# Patient Record
Sex: Female | Born: 1945 | Race: White | Hispanic: No | Marital: Married | Smoking: Never smoker
Health system: Southern US, Community
[De-identification: ages and names within clinical notes are randomized; demographics above are authoritative.]

## PROBLEM LIST (undated history)

## (undated) DIAGNOSIS — K589 Irritable bowel syndrome without diarrhea: Secondary | ICD-10-CM

## (undated) DIAGNOSIS — R000 Tachycardia, unspecified: Secondary | ICD-10-CM

## (undated) DIAGNOSIS — Z860100 Personal history of colon polyps, unspecified: Secondary | ICD-10-CM

## (undated) DIAGNOSIS — Z9889 Other specified postprocedural states: Secondary | ICD-10-CM

## (undated) DIAGNOSIS — R51 Headache: Secondary | ICD-10-CM

## (undated) DIAGNOSIS — E785 Hyperlipidemia, unspecified: Secondary | ICD-10-CM

## (undated) DIAGNOSIS — M2550 Pain in unspecified joint: Secondary | ICD-10-CM

## (undated) DIAGNOSIS — R05 Cough: Secondary | ICD-10-CM

## (undated) DIAGNOSIS — R0600 Dyspnea, unspecified: Secondary | ICD-10-CM

## (undated) DIAGNOSIS — M869 Osteomyelitis, unspecified: Secondary | ICD-10-CM

## (undated) DIAGNOSIS — K760 Fatty (change of) liver, not elsewhere classified: Secondary | ICD-10-CM

## (undated) DIAGNOSIS — E1140 Type 2 diabetes mellitus with diabetic neuropathy, unspecified: Secondary | ICD-10-CM

## (undated) DIAGNOSIS — Z89429 Acquired absence of other toe(s), unspecified side: Secondary | ICD-10-CM

## (undated) DIAGNOSIS — K219 Gastro-esophageal reflux disease without esophagitis: Secondary | ICD-10-CM

## (undated) DIAGNOSIS — D649 Anemia, unspecified: Secondary | ICD-10-CM

## (undated) DIAGNOSIS — L659 Nonscarring hair loss, unspecified: Secondary | ICD-10-CM

## (undated) DIAGNOSIS — G629 Polyneuropathy, unspecified: Secondary | ICD-10-CM

## (undated) DIAGNOSIS — M1990 Unspecified osteoarthritis, unspecified site: Secondary | ICD-10-CM

## (undated) DIAGNOSIS — J189 Pneumonia, unspecified organism: Secondary | ICD-10-CM

## (undated) DIAGNOSIS — G4700 Insomnia, unspecified: Secondary | ICD-10-CM

## (undated) DIAGNOSIS — G8929 Other chronic pain: Secondary | ICD-10-CM

## (undated) DIAGNOSIS — J302 Other seasonal allergic rhinitis: Secondary | ICD-10-CM

## (undated) DIAGNOSIS — I10 Essential (primary) hypertension: Secondary | ICD-10-CM

## (undated) DIAGNOSIS — R053 Chronic cough: Secondary | ICD-10-CM

## (undated) DIAGNOSIS — K5790 Diverticulosis of intestine, part unspecified, without perforation or abscess without bleeding: Secondary | ICD-10-CM

## (undated) DIAGNOSIS — R112 Nausea with vomiting, unspecified: Secondary | ICD-10-CM

## (undated) HISTORY — DX: Chronic cough: R05.3

## (undated) HISTORY — PX: TUBAL LIGATION: SHX77

## (undated) HISTORY — DX: Hyperlipidemia, unspecified: E78.5

## (undated) HISTORY — DX: Nonscarring hair loss, unspecified: L65.9

## (undated) HISTORY — DX: Acquired absence of other toe(s), unspecified side: Z89.429

## (undated) HISTORY — DX: Type 2 diabetes mellitus with diabetic neuropathy, unspecified: E11.40

## (undated) HISTORY — PX: COLONOSCOPY: SHX174

## (undated) HISTORY — DX: Gastro-esophageal reflux disease without esophagitis: K21.9

## (undated) HISTORY — DX: Cough: R05

## (undated) HISTORY — DX: Irritable bowel syndrome, unspecified: K58.9

## (undated) HISTORY — DX: Essential (primary) hypertension: I10

## (undated) HISTORY — DX: Anemia, unspecified: D64.9

## (undated) HISTORY — PX: ROTATOR CUFF REPAIR: SHX139

## (undated) HISTORY — DX: Other chronic pain: G89.29

---

## 1997-09-19 ENCOUNTER — Ambulatory Visit (HOSPITAL_COMMUNITY): Admission: RE | Admit: 1997-09-19 | Discharge: 1997-09-19 | Payer: Self-pay | Admitting: Obstetrics & Gynecology

## 1997-09-25 ENCOUNTER — Ambulatory Visit (HOSPITAL_COMMUNITY): Admission: RE | Admit: 1997-09-25 | Discharge: 1997-09-25 | Payer: Self-pay | Admitting: Obstetrics & Gynecology

## 1997-10-14 ENCOUNTER — Other Ambulatory Visit: Admission: RE | Admit: 1997-10-14 | Discharge: 1997-10-14 | Payer: Self-pay | Admitting: Obstetrics & Gynecology

## 1998-01-15 ENCOUNTER — Other Ambulatory Visit: Admission: RE | Admit: 1998-01-15 | Discharge: 1998-01-15 | Payer: Self-pay | Admitting: Obstetrics & Gynecology

## 1998-12-15 ENCOUNTER — Other Ambulatory Visit: Admission: RE | Admit: 1998-12-15 | Discharge: 1998-12-15 | Payer: Self-pay | Admitting: Obstetrics & Gynecology

## 1999-01-11 HISTORY — PX: OOPHORECTOMY: SHX86

## 1999-01-11 HISTORY — PX: VAGINAL HYSTERECTOMY: SUR661

## 1999-02-18 ENCOUNTER — Encounter (INDEPENDENT_AMBULATORY_CARE_PROVIDER_SITE_OTHER): Payer: Self-pay

## 1999-02-18 ENCOUNTER — Other Ambulatory Visit: Admission: RE | Admit: 1999-02-18 | Discharge: 1999-02-18 | Payer: Self-pay | Admitting: Gastroenterology

## 1999-07-19 ENCOUNTER — Encounter (INDEPENDENT_AMBULATORY_CARE_PROVIDER_SITE_OTHER): Payer: Self-pay

## 1999-07-19 ENCOUNTER — Ambulatory Visit (HOSPITAL_COMMUNITY): Admission: RE | Admit: 1999-07-19 | Discharge: 1999-07-19 | Payer: Self-pay | Admitting: Obstetrics & Gynecology

## 1999-12-15 ENCOUNTER — Observation Stay (HOSPITAL_COMMUNITY): Admission: RE | Admit: 1999-12-15 | Discharge: 1999-12-16 | Payer: Self-pay | Admitting: Obstetrics & Gynecology

## 1999-12-15 ENCOUNTER — Encounter (INDEPENDENT_AMBULATORY_CARE_PROVIDER_SITE_OTHER): Payer: Self-pay

## 2001-01-22 ENCOUNTER — Other Ambulatory Visit: Admission: RE | Admit: 2001-01-22 | Discharge: 2001-01-22 | Payer: Self-pay | Admitting: Obstetrics & Gynecology

## 2001-01-27 ENCOUNTER — Encounter: Payer: Self-pay | Admitting: Emergency Medicine

## 2001-01-27 ENCOUNTER — Emergency Department (HOSPITAL_COMMUNITY): Admission: EM | Admit: 2001-01-27 | Discharge: 2001-01-27 | Payer: Self-pay | Admitting: Emergency Medicine

## 2001-03-21 ENCOUNTER — Encounter: Payer: Self-pay | Admitting: Orthopaedic Surgery

## 2001-03-21 ENCOUNTER — Encounter: Admission: RE | Admit: 2001-03-21 | Discharge: 2001-03-21 | Payer: Self-pay | Admitting: Orthopaedic Surgery

## 2002-04-02 ENCOUNTER — Other Ambulatory Visit: Admission: RE | Admit: 2002-04-02 | Discharge: 2002-04-02 | Payer: Self-pay | Admitting: Obstetrics & Gynecology

## 2002-11-28 ENCOUNTER — Encounter: Payer: Self-pay | Admitting: Gastroenterology

## 2002-12-03 ENCOUNTER — Encounter: Payer: Self-pay | Admitting: Gastroenterology

## 2003-05-05 ENCOUNTER — Other Ambulatory Visit: Admission: RE | Admit: 2003-05-05 | Discharge: 2003-05-05 | Payer: Self-pay | Admitting: Obstetrics & Gynecology

## 2003-05-07 ENCOUNTER — Encounter: Admission: RE | Admit: 2003-05-07 | Discharge: 2003-05-07 | Payer: Self-pay | Admitting: Obstetrics & Gynecology

## 2003-10-09 ENCOUNTER — Emergency Department (HOSPITAL_COMMUNITY): Admission: EM | Admit: 2003-10-09 | Discharge: 2003-10-09 | Payer: Self-pay | Admitting: Emergency Medicine

## 2003-10-28 ENCOUNTER — Ambulatory Visit: Payer: Self-pay | Admitting: Internal Medicine

## 2003-11-12 ENCOUNTER — Observation Stay (HOSPITAL_COMMUNITY): Admission: AD | Admit: 2003-11-12 | Discharge: 2003-11-14 | Payer: Self-pay | Admitting: Orthopaedic Surgery

## 2004-05-03 ENCOUNTER — Ambulatory Visit: Payer: Self-pay | Admitting: Internal Medicine

## 2004-05-13 ENCOUNTER — Ambulatory Visit: Payer: Self-pay | Admitting: Internal Medicine

## 2004-06-09 ENCOUNTER — Ambulatory Visit (HOSPITAL_COMMUNITY): Admission: RE | Admit: 2004-06-09 | Discharge: 2004-06-09 | Payer: Self-pay | Admitting: Obstetrics & Gynecology

## 2004-06-10 ENCOUNTER — Other Ambulatory Visit: Admission: RE | Admit: 2004-06-10 | Discharge: 2004-06-10 | Payer: Self-pay | Admitting: Obstetrics & Gynecology

## 2004-07-09 ENCOUNTER — Ambulatory Visit: Payer: Self-pay | Admitting: Internal Medicine

## 2004-09-02 ENCOUNTER — Ambulatory Visit: Payer: Self-pay | Admitting: Internal Medicine

## 2004-09-09 ENCOUNTER — Ambulatory Visit: Payer: Self-pay | Admitting: Internal Medicine

## 2005-06-10 ENCOUNTER — Encounter: Admission: RE | Admit: 2005-06-10 | Discharge: 2005-06-10 | Payer: Self-pay | Admitting: Obstetrics & Gynecology

## 2005-08-04 ENCOUNTER — Ambulatory Visit: Payer: Self-pay | Admitting: Gastroenterology

## 2006-03-21 ENCOUNTER — Ambulatory Visit: Payer: Self-pay | Admitting: Internal Medicine

## 2006-03-21 LAB — CONVERTED CEMR LAB
Bilirubin Urine: NEGATIVE
Hemoglobin, Urine: NEGATIVE
Ketones, ur: NEGATIVE mg/dL
Leukocytes, UA: NEGATIVE
Nitrite: NEGATIVE
Specific Gravity, Urine: >=1.03 (ref 1.000–1.03)
Total Protein, Urine: NEGATIVE mg/dL
Urine Glucose: 100 mg/dL — AB
Urobilinogen, UA: 0.2 (ref 0.0–1.0)
pH: 5 (ref 5.0–8.0)

## 2006-03-28 ENCOUNTER — Ambulatory Visit: Payer: Self-pay | Admitting: Gastroenterology

## 2006-08-08 ENCOUNTER — Ambulatory Visit: Payer: Self-pay | Admitting: Internal Medicine

## 2006-10-11 ENCOUNTER — Encounter: Payer: Self-pay | Admitting: Internal Medicine

## 2006-10-11 ENCOUNTER — Ambulatory Visit: Payer: Self-pay | Admitting: Internal Medicine

## 2006-10-11 DIAGNOSIS — E1140 Type 2 diabetes mellitus with diabetic neuropathy, unspecified: Secondary | ICD-10-CM | POA: Insufficient documentation

## 2006-10-11 DIAGNOSIS — L658 Other specified nonscarring hair loss: Secondary | ICD-10-CM | POA: Insufficient documentation

## 2006-10-11 DIAGNOSIS — G629 Polyneuropathy, unspecified: Secondary | ICD-10-CM | POA: Insufficient documentation

## 2006-10-12 DIAGNOSIS — J309 Allergic rhinitis, unspecified: Secondary | ICD-10-CM | POA: Insufficient documentation

## 2006-10-12 DIAGNOSIS — K219 Gastro-esophageal reflux disease without esophagitis: Secondary | ICD-10-CM | POA: Insufficient documentation

## 2006-10-12 DIAGNOSIS — E1169 Type 2 diabetes mellitus with other specified complication: Secondary | ICD-10-CM | POA: Insufficient documentation

## 2006-10-12 DIAGNOSIS — K589 Irritable bowel syndrome without diarrhea: Secondary | ICD-10-CM | POA: Insufficient documentation

## 2006-10-12 DIAGNOSIS — E1151 Type 2 diabetes mellitus with diabetic peripheral angiopathy without gangrene: Secondary | ICD-10-CM | POA: Insufficient documentation

## 2006-10-12 DIAGNOSIS — E785 Hyperlipidemia, unspecified: Secondary | ICD-10-CM

## 2006-10-12 DIAGNOSIS — E119 Type 2 diabetes mellitus without complications: Secondary | ICD-10-CM | POA: Insufficient documentation

## 2006-10-12 DIAGNOSIS — E1165 Type 2 diabetes mellitus with hyperglycemia: Secondary | ICD-10-CM | POA: Insufficient documentation

## 2006-10-27 ENCOUNTER — Ambulatory Visit: Payer: Self-pay | Admitting: Internal Medicine

## 2006-10-27 LAB — CONVERTED CEMR LAB
Cholesterol: 171 mg/dL (ref 0–200)
HDL: 51.7 mg/dL (ref >39.0)
Hgb A1c MFr Bld: 9.9 % — ABNORMAL HIGH (ref 4.6–6.0)
LDL Cholesterol: 98 mg/dL (ref 0–99)
Total CHOL/HDL Ratio: 3.3
Triglycerides: 109 mg/dL (ref 0–149)
VLDL: 22 mg/dL (ref 0–40)

## 2006-11-08 ENCOUNTER — Encounter: Payer: Self-pay | Admitting: Internal Medicine

## 2006-11-08 ENCOUNTER — Ambulatory Visit: Payer: Self-pay | Admitting: Internal Medicine

## 2006-12-05 ENCOUNTER — Ambulatory Visit: Payer: Self-pay | Admitting: Internal Medicine

## 2006-12-05 DIAGNOSIS — I10 Essential (primary) hypertension: Secondary | ICD-10-CM

## 2006-12-05 DIAGNOSIS — I152 Hypertension secondary to endocrine disorders: Secondary | ICD-10-CM | POA: Insufficient documentation

## 2006-12-05 DIAGNOSIS — E1159 Type 2 diabetes mellitus with other circulatory complications: Secondary | ICD-10-CM | POA: Insufficient documentation

## 2006-12-21 ENCOUNTER — Encounter: Payer: Self-pay | Admitting: Internal Medicine

## 2006-12-26 ENCOUNTER — Encounter: Payer: Self-pay | Admitting: Internal Medicine

## 2006-12-28 ENCOUNTER — Telehealth: Payer: Self-pay | Admitting: Internal Medicine

## 2007-01-02 ENCOUNTER — Encounter: Admission: RE | Admit: 2007-01-02 | Discharge: 2007-01-02 | Payer: Self-pay | Admitting: Obstetrics & Gynecology

## 2007-01-02 ENCOUNTER — Encounter: Payer: Self-pay | Admitting: Internal Medicine

## 2007-01-15 ENCOUNTER — Encounter: Payer: Self-pay | Admitting: Internal Medicine

## 2007-01-15 ENCOUNTER — Encounter: Admission: RE | Admit: 2007-01-15 | Discharge: 2007-04-15 | Payer: Self-pay | Admitting: Internal Medicine

## 2007-01-26 ENCOUNTER — Ambulatory Visit: Payer: Self-pay | Admitting: Internal Medicine

## 2007-01-26 ENCOUNTER — Telehealth: Payer: Self-pay | Admitting: Internal Medicine

## 2007-01-31 ENCOUNTER — Ambulatory Visit: Payer: Self-pay | Admitting: Internal Medicine

## 2007-01-31 LAB — CONVERTED CEMR LAB
Cholesterol: 170 mg/dL (ref 0–200)
Free T4: 0.9 ng/dL (ref 0.6–1.6)
HDL: 54.8 mg/dL (ref >39.0)
Hgb A1c MFr Bld: 9 % — ABNORMAL HIGH (ref 4.6–6.0)
LDL Cholesterol: 100 mg/dL — ABNORMAL HIGH (ref 0–99)
TSH: 1.63 microintl units/mL (ref 0.35–5.50)
Total CHOL/HDL Ratio: 3.1
Triglycerides: 75 mg/dL (ref 0–149)
VLDL: 15 mg/dL (ref 0–40)

## 2007-02-01 ENCOUNTER — Encounter: Payer: Self-pay | Admitting: Internal Medicine

## 2007-02-02 ENCOUNTER — Encounter: Payer: Self-pay | Admitting: Internal Medicine

## 2007-02-02 ENCOUNTER — Ambulatory Visit: Payer: Self-pay

## 2007-02-05 ENCOUNTER — Ambulatory Visit: Payer: Self-pay | Admitting: Internal Medicine

## 2007-02-28 ENCOUNTER — Telehealth: Payer: Self-pay | Admitting: Internal Medicine

## 2007-04-18 ENCOUNTER — Telehealth: Payer: Self-pay | Admitting: Internal Medicine

## 2007-04-25 ENCOUNTER — Ambulatory Visit: Payer: Self-pay | Admitting: Internal Medicine

## 2007-06-01 ENCOUNTER — Telehealth: Payer: Self-pay | Admitting: Internal Medicine

## 2007-08-22 ENCOUNTER — Encounter: Payer: Self-pay | Admitting: Gastroenterology

## 2007-09-25 ENCOUNTER — Encounter: Payer: Self-pay | Admitting: Internal Medicine

## 2007-10-26 ENCOUNTER — Ambulatory Visit: Payer: Self-pay | Admitting: Internal Medicine

## 2007-10-26 LAB — CONVERTED CEMR LAB
ALT: 11 units/L (ref 0–35)
AST: 15 units/L (ref 0–37)
Albumin: 4.1 g/dL (ref 3.5–5.2)
Alkaline Phosphatase: 52 units/L (ref 39–117)
BUN: 16 mg/dL (ref 6–23)
Bilirubin, Direct: 0.1 mg/dL (ref 0.0–0.3)
CO2: 32 meq/L (ref 19–32)
Calcium: 9.7 mg/dL (ref 8.4–10.5)
Chloride: 100 meq/L (ref 96–112)
Cholesterol: 155 mg/dL (ref 0–200)
Creatinine, Ser: 0.6 mg/dL (ref 0.4–1.2)
GFR calc Af Amer: 131 mL/min
GFR calc non Af Amer: 108 mL/min
Glucose, Bld: 127 mg/dL — ABNORMAL HIGH (ref 70–99)
HDL: 55.9 mg/dL (ref >39.0)
LDL Cholesterol: 86 mg/dL (ref 0–99)
Potassium: 4.2 meq/L (ref 3.5–5.1)
Sodium: 139 meq/L (ref 135–145)
TSH: 1.81 microintl units/mL (ref 0.35–5.50)
Total Bilirubin: 0.5 mg/dL (ref 0.3–1.2)
Total CHOL/HDL Ratio: 2.8
Total Protein: 7.4 g/dL (ref 6.0–8.3)
Triglycerides: 68 mg/dL (ref 0–149)
VLDL: 14 mg/dL (ref 0–40)

## 2007-10-28 ENCOUNTER — Encounter: Payer: Self-pay | Admitting: Internal Medicine

## 2007-10-28 ENCOUNTER — Telehealth: Payer: Self-pay | Admitting: Internal Medicine

## 2007-10-31 ENCOUNTER — Ambulatory Visit: Payer: Self-pay | Admitting: Gastroenterology

## 2007-11-20 ENCOUNTER — Ambulatory Visit: Payer: Self-pay | Admitting: Gastroenterology

## 2007-12-04 ENCOUNTER — Ambulatory Visit: Payer: Self-pay | Admitting: Gastroenterology

## 2007-12-04 LAB — HM COLONOSCOPY

## 2008-05-20 ENCOUNTER — Telehealth (INDEPENDENT_AMBULATORY_CARE_PROVIDER_SITE_OTHER): Payer: Self-pay | Admitting: *Deleted

## 2008-05-21 ENCOUNTER — Ambulatory Visit: Payer: Self-pay | Admitting: Internal Medicine

## 2008-06-05 ENCOUNTER — Ambulatory Visit: Payer: Self-pay | Admitting: Internal Medicine

## 2008-06-05 ENCOUNTER — Telehealth (INDEPENDENT_AMBULATORY_CARE_PROVIDER_SITE_OTHER): Payer: Self-pay | Admitting: *Deleted

## 2008-06-05 DIAGNOSIS — J039 Acute tonsillitis, unspecified: Secondary | ICD-10-CM | POA: Insufficient documentation

## 2008-06-05 LAB — CONVERTED CEMR LAB: Rapid Strep: NEGATIVE

## 2008-06-10 ENCOUNTER — Encounter: Payer: Self-pay | Admitting: Internal Medicine

## 2008-08-19 ENCOUNTER — Telehealth: Payer: Self-pay | Admitting: Internal Medicine

## 2008-10-14 ENCOUNTER — Ambulatory Visit: Payer: Self-pay | Admitting: Internal Medicine

## 2008-10-14 LAB — CONVERTED CEMR LAB
ALT: 14 units/L (ref 0–35)
AST: 13 units/L (ref 0–37)
Albumin: 4 g/dL (ref 3.5–5.2)
Alkaline Phosphatase: 52 units/L (ref 39–117)
BUN: 16 mg/dL (ref 6–23)
Basophils Absolute: 0 10*3/uL (ref 0.0–0.1)
Basophils Relative: 0.5 % (ref 0.0–3.0)
Bilirubin Urine: NEGATIVE
Bilirubin, Direct: 0.1 mg/dL (ref 0.0–0.3)
Blood Glucose, Fingerstick: 196
CO2: 30 meq/L (ref 19–32)
Calcium: 9.6 mg/dL (ref 8.4–10.5)
Chloride: 105 meq/L (ref 96–112)
Cholesterol: 159 mg/dL (ref 0–200)
Creatinine, Ser: 0.6 mg/dL (ref 0.4–1.2)
Eosinophils Absolute: 0.2 10*3/uL (ref 0.0–0.7)
Eosinophils Relative: 1.7 % (ref 0.0–5.0)
GFR calc non Af Amer: 108.01 mL/min (ref >60)
Glucose, Bld: 146 mg/dL — ABNORMAL HIGH (ref 70–99)
Glucose, Urine, Semiquant: NEGATIVE
HCT: 38 % (ref 36.0–46.0)
HDL: 51.7 mg/dL (ref >39.00)
Hemoglobin: 13 g/dL (ref 12.0–15.0)
Hgb A1c MFr Bld: 8.3 % — ABNORMAL HIGH (ref 4.6–6.5)
Ketones, urine, test strip: NEGATIVE
LDL Cholesterol: 87 mg/dL (ref 0–99)
Lymphocytes Relative: 30 % (ref 12.0–46.0)
Lymphs Abs: 2.7 10*3/uL (ref 0.7–4.0)
MCHC: 34.1 g/dL (ref 30.0–36.0)
MCV: 85.8 fL (ref 78.0–100.0)
Monocytes Absolute: 0.4 10*3/uL (ref 0.1–1.0)
Monocytes Relative: 4.8 % (ref 3.0–12.0)
Neutro Abs: 5.7 10*3/uL (ref 1.4–7.7)
Neutrophils Relative %: 63 % (ref 43.0–77.0)
Nitrite: NEGATIVE
Platelets: 412 10*3/uL — ABNORMAL HIGH (ref 150.0–400.0)
Potassium: 4 meq/L (ref 3.5–5.1)
Protein, U semiquant: NEGATIVE
RBC: 4.42 M/uL (ref 3.87–5.11)
RDW: 11.7 % (ref 11.5–14.6)
Sodium: 140 meq/L (ref 135–145)
Specific Gravity, Urine: 1.02
Total Bilirubin: 0.6 mg/dL (ref 0.3–1.2)
Total CHOL/HDL Ratio: 3
Total Protein: 7.3 g/dL (ref 6.0–8.3)
Triglycerides: 103 mg/dL (ref 0.0–149.0)
Urobilinogen, UA: 0.2
VLDL: 20.6 mg/dL (ref 0.0–40.0)
WBC Urine, dipstick: NEGATIVE
WBC: 9 10*3/uL (ref 4.5–10.5)
pH: 5

## 2008-10-22 ENCOUNTER — Ambulatory Visit: Payer: Self-pay | Admitting: Internal Medicine

## 2008-10-22 DIAGNOSIS — H60399 Other infective otitis externa, unspecified ear: Secondary | ICD-10-CM | POA: Insufficient documentation

## 2008-10-22 DIAGNOSIS — B373 Candidiasis of vulva and vagina: Secondary | ICD-10-CM | POA: Insufficient documentation

## 2008-10-24 ENCOUNTER — Telehealth: Payer: Self-pay | Admitting: Internal Medicine

## 2008-10-24 ENCOUNTER — Ambulatory Visit: Payer: Self-pay | Admitting: Internal Medicine

## 2008-10-24 DIAGNOSIS — L02818 Cutaneous abscess of other sites: Secondary | ICD-10-CM | POA: Insufficient documentation

## 2008-10-24 DIAGNOSIS — L03818 Cellulitis of other sites: Secondary | ICD-10-CM

## 2008-11-19 ENCOUNTER — Telehealth: Payer: Self-pay | Admitting: Internal Medicine

## 2008-11-27 ENCOUNTER — Encounter: Admission: RE | Admit: 2008-11-27 | Discharge: 2008-11-27 | Payer: Self-pay | Admitting: Obstetrics & Gynecology

## 2009-03-03 ENCOUNTER — Telehealth (INDEPENDENT_AMBULATORY_CARE_PROVIDER_SITE_OTHER): Payer: Self-pay | Admitting: *Deleted

## 2009-03-09 ENCOUNTER — Telehealth: Payer: Self-pay | Admitting: Internal Medicine

## 2009-04-08 ENCOUNTER — Telehealth: Payer: Self-pay | Admitting: Internal Medicine

## 2009-07-31 ENCOUNTER — Encounter: Payer: Self-pay | Admitting: Internal Medicine

## 2009-07-31 ENCOUNTER — Telehealth (INDEPENDENT_AMBULATORY_CARE_PROVIDER_SITE_OTHER): Payer: Self-pay | Admitting: *Deleted

## 2009-07-31 LAB — HM DIABETES EYE EXAM

## 2009-09-29 ENCOUNTER — Ambulatory Visit: Payer: Self-pay | Admitting: Internal Medicine

## 2009-10-30 ENCOUNTER — Telehealth: Payer: Self-pay | Admitting: Internal Medicine

## 2009-10-31 ENCOUNTER — Ambulatory Visit: Payer: Self-pay | Admitting: Family Medicine

## 2009-10-31 DIAGNOSIS — J019 Acute sinusitis, unspecified: Secondary | ICD-10-CM | POA: Insufficient documentation

## 2009-12-16 ENCOUNTER — Encounter
Admission: RE | Admit: 2009-12-16 | Discharge: 2009-12-16 | Payer: Self-pay | Source: Home / Self Care | Admitting: Internal Medicine

## 2009-12-16 LAB — HM MAMMOGRAPHY: HM Mammogram: NEGATIVE

## 2010-02-04 ENCOUNTER — Ambulatory Visit
Admission: RE | Admit: 2010-02-04 | Discharge: 2010-02-04 | Payer: Self-pay | Source: Home / Self Care | Attending: Internal Medicine | Admitting: Internal Medicine

## 2010-02-04 ENCOUNTER — Other Ambulatory Visit: Payer: Self-pay | Admitting: Internal Medicine

## 2010-02-04 LAB — CBC WITH DIFFERENTIAL/PLATELET
Basophils Absolute: 0.1 10*3/uL (ref 0.0–0.1)
Basophils Relative: 0.8 % (ref 0.0–3.0)
Eosinophils Absolute: 0.2 10*3/uL (ref 0.0–0.7)
Eosinophils Relative: 1.8 % (ref 0.0–5.0)
HCT: 38.2 % (ref 36.0–46.0)
Hemoglobin: 13 g/dL (ref 12.0–15.0)
Lymphocytes Relative: 27.8 % (ref 12.0–46.0)
Lymphs Abs: 2.8 10*3/uL (ref 0.7–4.0)
MCHC: 34 g/dL (ref 30.0–36.0)
MCV: 84.6 fl (ref 78.0–100.0)
Monocytes Absolute: 0.5 10*3/uL (ref 0.1–1.0)
Monocytes Relative: 4.8 % (ref 3.0–12.0)
Neutro Abs: 6.5 10*3/uL (ref 1.4–7.7)
Neutrophils Relative %: 64.8 % (ref 43.0–77.0)
Platelets: 419 10*3/uL — ABNORMAL HIGH (ref 150.0–400.0)
RBC: 4.52 Mil/uL (ref 3.87–5.11)
RDW: 13.2 % (ref 11.5–14.6)
WBC: 10.1 10*3/uL (ref 4.5–10.5)

## 2010-02-04 LAB — BASIC METABOLIC PANEL
BUN: 12 mg/dL (ref 6–23)
CO2: 28 mEq/L (ref 19–32)
Calcium: 9.7 mg/dL (ref 8.4–10.5)
Chloride: 98 mEq/L (ref 96–112)
Creatinine, Ser: 0.6 mg/dL (ref 0.4–1.2)
GFR: 107.54 mL/min (ref >60.00)
Glucose, Bld: 167 mg/dL — ABNORMAL HIGH (ref 70–99)
Potassium: 4.2 mEq/L (ref 3.5–5.1)
Sodium: 136 mEq/L (ref 135–145)

## 2010-02-04 LAB — LIPID PANEL
Cholesterol: 146 mg/dL (ref 0–200)
HDL: 44.9 mg/dL (ref >39.00)
LDL Cholesterol: 84 mg/dL (ref 0–99)
Total CHOL/HDL Ratio: 3
Triglycerides: 85 mg/dL (ref 0.0–149.0)
VLDL: 17 mg/dL (ref 0.0–40.0)

## 2010-02-04 LAB — HEPATIC FUNCTION PANEL
ALT: 22 U/L (ref 0–35)
AST: 27 U/L (ref 0–37)
Albumin: 4.1 g/dL (ref 3.5–5.2)
Alkaline Phosphatase: 53 U/L (ref 39–117)
Bilirubin, Direct: 0.1 mg/dL (ref 0.0–0.3)
Total Bilirubin: 0.6 mg/dL (ref 0.3–1.2)
Total Protein: 7.1 g/dL (ref 6.0–8.3)

## 2010-02-04 LAB — B12 AND FOLATE PANEL
Folate: 22.4 ng/mL (ref >5.9)
Vitamin B-12: 212 pg/mL (ref 211–911)

## 2010-02-04 LAB — IBC PANEL
Iron: 67 ug/dL (ref 42–145)
Saturation Ratios: 18.5 % — ABNORMAL LOW (ref 20.0–50.0)
Transferrin: 258.9 mg/dL (ref 212.0–360.0)

## 2010-02-04 LAB — CONVERTED CEMR LAB: Blood Glucose, AC Bkfst: 204 mg/dL

## 2010-02-04 LAB — HEMOGLOBIN A1C: Hgb A1c MFr Bld: 10.4 % — ABNORMAL HIGH (ref 4.6–6.5)

## 2010-02-05 ENCOUNTER — Telehealth (INDEPENDENT_AMBULATORY_CARE_PROVIDER_SITE_OTHER): Payer: Self-pay | Admitting: *Deleted

## 2010-02-09 NOTE — Progress Notes (Signed)
  Phone Note Refill Request   Refills Requested: Medication #1:  AMITRIPTYLINE HCL 25 MG  TABS 1 at bedtime for neuropathy Initial call taken by: Lamar Sprinkles, CMA,  March 09, 2009 5:42 PM    Prescriptions: AMITRIPTYLINE HCL 25 MG  TABS (AMITRIPTYLINE HCL) 1 at bedtime for neuropathy  #14 x 1   Entered by:   Lamar Sprinkles, CMA   Authorized by:   Jacques Navy MD   Signed by:   Lamar Sprinkles, CMA on 03/09/2009   Method used:   [facility]        [facility] [address] [phone]* (retail)       [address]       [place], [place]  [address]       Ph: [phone] or [phone]       Fax: [phone]   RxID:   [id number] AMITRIPTYLINE HCL 25 MG  TABS (AMITRIPTYLINE HCL) 1 at bedtime for neuropathy  #90 x 3   Entered by:   Lamar Sprinkles, CMA   Authorized by:   Jacques Navy MD   Signed by:   Lamar Sprinkles, CMA on 03/09/2009   Method used:   Electronically to        [facility]* (mail-order)             ,          Ph: [phone]       Fax: [phone]   RxID:   [id number]

## 2010-02-09 NOTE — Progress Notes (Signed)
Summary: refill  Phone Note Call from Patient   Summary of Call: Pt called stating that her glands are swollen in her throat again. Wants to know if she can get another prescription for [facility]cillin [facility] [address] Please advise. Initial call taken by: Alysia Penna,  October 30, 2009 1:15 PM  Follow-up for Phone Call        LEft vm to call office back. Pt needs office visit for eval.  Follow-up by: Lamar Sprinkles, CMA,  October 30, 2009 1:33 PM  Additional Follow-up for Phone Call Additional follow up Details #1::        left vm for pt to contact office.  Additional Follow-up by: Lamar Sprinkles, CMA,  October 30, 2009 5:26 PM

## 2010-02-09 NOTE — Progress Notes (Signed)
     Allergies: 1)  ! Codeine 2)  ! Darvon   Past History:  Care Management: Ophthalmology:Pt's last eye exam was 07/31/2009 at [facility]. Normal exam  no retinopathy. Advised follow up to recheck glaucome in 3 months,

## 2010-02-09 NOTE — Letter (Signed)
   [facility] [address] [place], [place]  [address] Phone: [phone]      October 28, 2007   Beth Holden [address] [place], [place] [address]  RE:  LAB RESULTS  Dear  [name],  The following is an interpretation of your most recent lab tests.  Please take note of any instructions provided or changes to medications that have resulted from your lab work.  ELECTROLYTES:  Good - no changes needed  KIDNEY FUNCTION TESTS:  Good - no changes needed  LIVER FUNCTION TESTS:  Good - no changes needed  LIPID PANEL:  Good - no changes needed Triglyceride: 68   Cholesterol: 155   LDL: 86   HDL: 55.9   Chol/HDL%:  2.8 CALC  THYROID STUDIES:  Thyroid studies normal TSH: 1.81     DIABETIC STUDIES:  Good - no changes needed Blood Glucose: 127   HgbA1C: 9.0        Blood sugar is poorly controlled with A1C of 9.0% with a goal of 7% or less. Please schedule a follow-up appointment so that we can discuss treatment options that will bring your diabetes under better control.   Sincerely Yours,    Jacques Navy MD

## 2010-02-09 NOTE — Assessment & Plan Note (Signed)
Summary: SORE THROAT---STC   Vital Signs:  Patient profile:   65 year old female Height:      67 inches Weight:      184 pounds BMI:     28.92 O2 Sat:      99 % on Room air Temp:     98.0 degrees F oral Pulse rate:   83 / minute BP sitting:   122 / 70  (left arm) Cuff size:   regular  Vitals Entered By: Alysia Penna (September 29, 2009 2:32 PM)  O2 Flow:  Room air CC: pt c/o sore throat beginning 09/28/2009. Pt has been gargling with warm salt water every 30 mins./ cp sma   Primary Care Provider:  Norins  CC:  pt c/o sore throat beginning 09/28/2009. Pt has been gargling with warm salt water every 30 mins./ cp sma.  History of Present Illness: Patient presents with the on-set of a sore throat. No fever, chills, cough, SOB. No Nausea or vomiting. She does feel sinus congestion and pressure in her ears. It does hurt to swallow.   Current Medications (verified): 1)  Enalapril Maleate 20 Mg  Tabs (Enalapril Maleate) .... Once Daily 2)  Nexium 40 Mg  Cpdr (Esomeprazole Magnesium) .... One Tablet By Mouth Two Times A Day 3)  Metformin Hcl 1000 Mg  Tabs (Metformin Hcl) .... Two Times A Day 4)  Crestor 5 Mg  Tabs (Rosuvastatin Calcium) .Marland Kitchen.. 1 By Mouth At Bedtime 5)  Cetirizine Hcl 10 Mg  Tabs (Cetirizine Hcl) .Marland Kitchen.. 1 By Mouth Every Morning 6)  Tylenol Extra Strength 500 Mg  Tabs (Acetaminophen) .... As Needed 7)  Gabapentin 300 Mg  Caps (Gabapentin) .Marland Kitchen.. 1 At Bedtime 8)  Amitriptyline Hcl 25 Mg  Tabs (Amitriptyline Hcl) .Marland Kitchen.. 1 At Bedtime For Neuropathy 9)  Bd Ultra-Fine Pen Needles 29g X 12.71mm  Misc (Insulin Pen Needle) .... Use As Directed 10)  Onetouch Ultra Test   Strp (Glucose Blood) .... Two Times A Day Dx: 250.01 11)  Byetta 10 Mcg Pen 10 Mcg/0.57ml  Soln (Exenatide) .... Inject 10 Mcg  Two Times A Day  Allergies (verified): 1)  ! Codeine 2)  ! Darvon  Past History:  Past Medical History: Last updated: 10/31/2007 Allergic rhinitis Colonic polyps, hx of - adenoma  02/1999 Diabetes mellitus, type II GERD Hyperlipidemia IBS Hypertension Peripheral neuropathy Erosive esophagitis  Past Surgical History: Last updated: 01/26/2007 Hysterectomy-vaginal  '01 Oophorectomy-'01 Tubal ligation Rotator cuff repair - left PSH reviewed for relevance, FH reviewed for relevance  Review of Systems  The patient denies anorexia, fever, weight loss, chest pain, syncope, dyspnea on exertion, abdominal pain, muscle weakness, abnormal bleeding, and enlarged lymph nodes.    Physical Exam  General:  WNWD white female Head:  no tenderness to percussion over the frontal or maxillary sinus Eyes:  C&S clear Ears:  EACs clear, TM's normal Mouth:  posterior pharyns with erythema without exudate Lungs:  normal respiratory effort, normal breath sounds, no crackles, and no wheezes.   Heart:  normal rate and regular rhythm.   Abdomen:  soft and normal bowel sounds.   Msk:  normal ROM.   Pulses:  2+ radial Neurologic:  alert & oriented X3 and gait normal.   Skin:  turgor normal and color normal.   Cervical Nodes:  no anterior cervical adenopathy and no posterior cervical adenopathy.   Psych:  Oriented X3, normally interactive, and good eye contact.     Impression & Recommendations:  Problem # 1:  PHARYNGITIS, ACUTE (ICD-462) Acute pharyngitis  Plan - amoxicillin 875 mg two times a day x 7 days           supportive care.  Her updated medication list for this problem includes:    Tylenol Extra Strength 500 Mg Tabs (Acetaminophen) .Marland Kitchen... As needed    Amoxicillin 875 Mg Tabs (Amoxicillin) .Marland Kitchen... 1 by mouth two times a day x 7 for pharyngitis  Complete Medication List: 1)  Enalapril Maleate 20 Mg Tabs (Enalapril maleate) .... Once daily 2)  Nexium 40 Mg Cpdr (Esomeprazole magnesium) .... One tablet by mouth two times a day 3)  Metformin Hcl 1000 Mg Tabs (Metformin hcl) .... Two times a day 4)  Crestor 5 Mg Tabs (Rosuvastatin calcium) .Marland Kitchen.. 1 by mouth at bedtime 5)   Cetirizine Hcl 10 Mg Tabs (Cetirizine hcl) .Marland Kitchen.. 1 by mouth every morning 6)  Tylenol Extra Strength 500 Mg Tabs (Acetaminophen) .... As needed 7)  Gabapentin 300 Mg Caps (Gabapentin) .Marland Kitchen.. 1 at bedtime 8)  Amitriptyline Hcl 25 Mg Tabs (Amitriptyline hcl) .Marland Kitchen.. 1 at bedtime for neuropathy 9)  Bd Ultra-fine Pen Needles 29g X 12.38mm Misc (Insulin pen needle) .... Use as directed 10)  Onetouch Ultra Test Strp (Glucose blood) .... Two times a day dx: 250.01 11)  Byetta 10 Mcg Pen 10 Mcg/0.47ml Soln (Exenatide) .... Inject 10 mcg  two times a day 12)  Amoxicillin 875 Mg Tabs (Amoxicillin) .Marland Kitchen.. 1 by mouth two times a day x 7 for pharyngitis  Patient Instructions: 1)  pharyngitis - probably bacterial. Take Amoxicillin two times a day x 7 days; gargle of choice; Vit C 1000-1500 mg daily; hydrate; tylenol 500-1000mg  three times a day.  Prescriptions: AMOXICILLIN 875 MG TABS (AMOXICILLIN) 1 by mouth two times a day x 7 for pharyngitis  #14 x 0   Entered and Authorized by:   Jacques Navy MD   Signed by:   Jacques Navy MD on 09/29/2009   Method used:   [facility]        [facility] [address] [phone]* (retail)       [address]       [place], [place]  [address]       Ph: [phone] or [phone]       Fax: [phone]   RxID:   [id number]

## 2010-02-09 NOTE — Miscellaneous (Signed)
Summary: Orders Update   Clinical Lists Changes  Orders: Added new Referral order of Endocrinology Referral (Endocrine) - Signed 

## 2010-02-09 NOTE — Progress Notes (Signed)
  Phone Note Refill Request Message from:  Fax from Pharmacy on March 03, 2009 12:24 PM  Refills Requested: Medication #1:  ENALAPRIL MALEATE 20 MG  TABS once daily Initial call taken by: Ami Bullins CMA,  March 03, 2009 12:24 PM    Prescriptions: ENALAPRIL MALEATE 20 MG  TABS (ENALAPRIL MALEATE) once daily  #90 x 3   Entered by:   Ami Bullins CMA   Authorized by:   Jacques Navy MD   Signed by:   Bill Salinas CMA on 03/03/2009   Method used:   Faxed to ...       [facility]* (mail-order)             ,          Ph: [phone]       Fax: [phone]   RxID:   [id number]

## 2010-02-09 NOTE — Assessment & Plan Note (Signed)
Summary: ear pain SD   Vital Signs:  Patient profile:   65 year old female Height:      67 inches Weight:      185 pounds BMI:     29.08 O2 Sat:      97 % on Room air Temp:     98.6 degrees F oral Pulse rate:   83 / minute BP sitting:   120 / 72  (left arm) Cuff size:   regular  Vitals Entered By: Bill Salinas CMA (October 24, 2008 3:49 PM)  O2 Flow:  Room air CC: Pt was here earlier in the week and had a place on her Right ear lanced, she states the pain and swelling in that ear has returned/ ab   Primary Care Provider:  Norins  CC:  Pt was here earlier in the week and had a place on her Right ear lanced and she states the pain and swelling in that ear has returned/ ab.  History of Present Illness: here with above, wanted a re-check of the right tragus area, as she states it may be increasing in swelling and discomfort, despite good compliance with oral meds (2 antibx - see EMR) and recent I&D. denies fever, headache, chills, ST , cough or other symptoms today.  Pt denies CP, sob, doe, wheezing, orthopnea, pnd, worsening LE edema, palps, dizziness or syncope .  Pt denies polydipsia, polyuria, or low sugar symptoms such as shakiness improved with eating.  Overall good compliance with meds, trying to follow low chol, DM diet, wt stable, little excercise however   Problems Prior to Update: 1)  Candidiasis, Vaginal  (ICD-112.1) 2)  Unspecified Infective Otitis Externa  (ICD-380.10) 3)  Tonsillitis  (ICD-463) 4)  Constipation  (ICD-564.00) 5)  Unspecified Disease of Hair and Hair Follicles  (ICD-704.9) 6)  Chest Pain  (ICD-786.50) 7)  Unspecified Essential Hypertension  (ICD-401.9) 8)  Alopecia Nec  (ICD-704.09) 9)  Diabetic Peripheral Neuropathy  (ICD-250.60) 10)  Irritable Bowel Syndrome  (ICD-564.1) 11)  Hyperlipidemia  (ICD-272.4) 12)  Gerd  (ICD-530.81) 13)  Diabetes Mellitus, Type II  (ICD-250.00) 14)  Colonic Polyps, Hx of  (ICD-V12.72) 15)  Allergic Rhinitis  (ICD-477.9)   Medications Prior to Update: 1)  Enalapril Maleate 20 Mg  Tabs (Enalapril Maleate) .... Once Daily 2)  Nexium 40 Mg  Cpdr (Esomeprazole Magnesium) .... One Tablet By Mouth Two Times A Day 3)  Metformin Hcl 1000 Mg  Tabs (Metformin Hcl) .... Two Times A Day 4)  Crestor 5 Mg  Tabs (Rosuvastatin Calcium) .Marland Kitchen.. 1 By Mouth At Bedtime 5)  Cetirizine Hcl 10 Mg  Tabs (Cetirizine Hcl) .Marland Kitchen.. 1 By Mouth Every Morning 6)  Tylenol Extra Strength 500 Mg  Tabs (Acetaminophen) .... As Needed 7)  Gabapentin 300 Mg  Caps (Gabapentin) .Marland Kitchen.. 1 At Bedtime 8)  Amitriptyline Hcl 25 Mg  Tabs (Amitriptyline Hcl) .Marland Kitchen.. 1 At Bedtime For Neuropathy 9)  Bd Ultra-Fine Pen Needles 29g X 12.33mm  Misc (Insulin Pen Needle) .... Use As Directed 10)  Onetouch Ultra Test   Strp (Glucose Blood) .... Two Times A Day Dx: 250.01 11)  Byetta 10 Mcg Pen 10 Mcg/0.68ml  Soln (Exenatide) .... Inject 10 Mcg  Two Times A Day 12)  Doxycycline Hyclate 100 Mg Caps (Doxycycline Hyclate) .... Take 1 Two Times A Day 13)  Mupirocin 2 % Oint (Mupirocin) .... Apply Two Times A Day 14)  Amoxicillin-Pot Clavulanate 875-125 Mg Tabs (Amoxicillin-Pot Clavulanate) .Marland Kitchen.. 1 By Mouth Two Times A  Day 15)  Fluconazole 100 Mg Tabs (Fluconazole) .Marland Kitchen.. 1 By Mouth Every Other Day Until Gone  Current Medications (verified): 1)  Enalapril Maleate 20 Mg  Tabs (Enalapril Maleate) .... Once Daily 2)  Nexium 40 Mg  Cpdr (Esomeprazole Magnesium) .... One Tablet By Mouth Two Times A Day 3)  Metformin Hcl 1000 Mg  Tabs (Metformin Hcl) .... Two Times A Day 4)  Crestor 5 Mg  Tabs (Rosuvastatin Calcium) .Marland Kitchen.. 1 By Mouth At Bedtime 5)  Cetirizine Hcl 10 Mg  Tabs (Cetirizine Hcl) .Marland Kitchen.. 1 By Mouth Every Morning 6)  Tylenol Extra Strength 500 Mg  Tabs (Acetaminophen) .... As Needed 7)  Gabapentin 300 Mg  Caps (Gabapentin) .Marland Kitchen.. 1 At Bedtime 8)  Amitriptyline Hcl 25 Mg  Tabs (Amitriptyline Hcl) .Marland Kitchen.. 1 At Bedtime For Neuropathy 9)  Bd Ultra-Fine Pen Needles 29g X 12.52mm  Misc (Insulin  Pen Needle) .... Use As Directed 10)  Onetouch Ultra Test   Strp (Glucose Blood) .... Two Times A Day Dx: 250.01 11)  Byetta 10 Mcg Pen 10 Mcg/0.88ml  Soln (Exenatide) .... Inject 10 Mcg  Two Times A Day 12)  Doxycycline Hyclate 100 Mg Caps (Doxycycline Hyclate) .... Take 1 Two Times A Day 13)  Mupirocin 2 % Oint (Mupirocin) .... Apply Two Times A Day 14)  Amoxicillin-Pot Clavulanate 875-125 Mg Tabs (Amoxicillin-Pot Clavulanate) .Marland Kitchen.. 1 By Mouth Two Times A Day 15)  Fluconazole 100 Mg Tabs (Fluconazole) .Marland Kitchen.. 1 By Mouth Every Other Day Until Gone  Allergies (verified): 1)  ! Codeine 2)  ! Darvon  Past History:  Past Medical History: Last updated: 10/31/2007 Allergic rhinitis Colonic polyps, hx of - adenoma 02/1999 Diabetes mellitus, type II GERD Hyperlipidemia IBS Hypertension Peripheral neuropathy Erosive esophagitis  Past Surgical History: Last updated: 01/26/2007 Hysterectomy-vaginal  '01 Oophorectomy-'01 Tubal ligation Rotator cuff repair - left  Social History: Last updated: 10/14/2008 HSG,  [employer] 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 (Summer '10)  Risk Factors: Exercise: no (10/11/2006)  Risk Factors: Smoking Status: never (10/11/2006)  Review of Systems       all otherwise negative per pt   Physical Exam  General:  alert and overweight-appearing., nontoxic Head:  normocephalic and atraumatic.   Eyes:  vision grossly intact, pupils equal, and pupils round.   Ears:  R ear normal and L ear normal including canals and TM's, except for the right tragus area which has some slight erythema and swelling and mild tender, but also a scab overlying that seems to be the source of her concern;  no fluctuance noted, no "head" to the lesion and no drainage Nose:  no external deformity and no nasal discharge.   Mouth:  no gingival abnormalities and pharynx pink and moist.    Neck:  supple and no masses.   Lungs:  normal respiratory effort and normal breath sounds.   Heart:  normal rate and regular rhythm.   Extremities:  no edema, no erythema    Impression & Recommendations:  Problem # 1:  CELLULITIS AND ABSCESS OF OTHER SPECIFIED SITE [phone])  Her updated medication list for this problem includes:    Amoxicillin-pot Clavulanate 875-125 Mg Tabs (Amoxicillin-pot clavulanate) .Marland Kitchen... 1 by mouth two times a day, and doxy per EMR;  d/w pt hx and exam, I dont see need for repeat I&D , to cont same antibx for now, and follow with expectant management  Problem # 2:  DIABETES MELLITUS, TYPE II (ICD-250.00)  Her updated medication list for this problem includes:    Enalapril Maleate 20 Mg Tabs (Enalapril maleate) ..... Once daily    Metformin Hcl 1000 Mg Tabs (Metformin hcl) .Marland Kitchen..Marland Kitchen Two times a day    Byetta 10 Mcg Pen 10 Mcg/0.89ml Soln (Exenatide) ..... Inject 10 mcg  two times a day  Labs Reviewed: Creat: 0.6 (10/14/2008)     Last Eye Exam: normal (05/21/2008) Reviewed HgBA1c results: 8.3 (10/14/2008)  9.0 (01/31/2007)  could be an issue with infection occurring as above;  Pt to cont DM diet, excercise, wt loss efforts; and f/u primary MD  Complete Medication List: 1)  Enalapril Maleate 20 Mg Tabs (Enalapril maleate) .... Once daily 2)  Nexium 40 Mg Cpdr (Esomeprazole magnesium) .... One tablet by mouth two times a day 3)  Metformin Hcl 1000 Mg Tabs (Metformin hcl) .... Two times a day 4)  Crestor 5 Mg Tabs (Rosuvastatin calcium) .Marland Kitchen.. 1 by mouth at bedtime 5)  Cetirizine Hcl 10 Mg Tabs (Cetirizine hcl) .Marland Kitchen.. 1 by mouth every morning 6)  Tylenol Extra Strength 500 Mg Tabs (Acetaminophen) .... As needed 7)  Gabapentin 300 Mg Caps (Gabapentin) .Marland Kitchen.. 1 at bedtime 8)  Amitriptyline Hcl 25 Mg Tabs (Amitriptyline hcl) .Marland Kitchen.. 1 at bedtime for neuropathy 9)  Bd Ultra-fine Pen Needles 29g X 12.35mm Misc (Insulin pen needle) .... Use as directed 10)  Onetouch Ultra  Test Strp (Glucose blood) .... Two times a day dx: 250.01 11)  Byetta 10 Mcg Pen 10 Mcg/0.65ml Soln (Exenatide) .... Inject 10 mcg  two times a day 12)  Mupirocin 2 % Oint (Mupirocin) .... Apply two times a day 13)  Amoxicillin-pot Clavulanate 875-125 Mg Tabs (Amoxicillin-pot clavulanate) .Marland Kitchen.. 1 by mouth two times a day 14)  Fluconazole 100 Mg Tabs (Fluconazole) .Marland Kitchen.. 1 by mouth every other day until gone

## 2010-02-09 NOTE — Progress Notes (Signed)
Summary: Double medication  Phone Note Call from Patient   Caller: Patient Summary of Call: Pt called concerned about double dose of medication of Metformin 1000mg , Enalapril 20mg , Nexium 40mg , & allergy medication. Asked pt to take BP while on phone same was 139/91 P-108, also had pt to check Glucose-130. Advised pt to drink plenty of fluids, monitor glucose and BP throughout the day, pt stated will have sister to sit with her since she lives alone, not to take any of the meds she took this AM again until tomorrow, and to take evening meds as directed. Initial call taken by: Zackery Barefoot CMA,  February 28, 2007 9:56 AM  Follow-up for Phone Call        Called pt at residence to check on status, pt states is well and apologizes for calling in a state of panic this AM. Follow-up by: Zackery Barefoot CMA,  February 28, 2007 3:27 PM

## 2010-02-09 NOTE — Letter (Signed)
Summary: [facility]   Imported By: Maryln Gottron 06/13/2008 14:03:46  _____________________________________________________________________  External Attachment:    Type:   Image     Comment:   External Document

## 2010-02-09 NOTE — Progress Notes (Signed)
  Phone Note Refill Request Message from:  Fax from Pharmacy on April 08, 2009 2:09 PM  Refills Requested: Medication #1:  GABAPENTIN 300 MG  CAPS 1 at bedtime Initial call taken by: Rock Nephew CMA,  April 08, 2009 2:09 PM    Prescriptions: GABAPENTIN 300 MG  CAPS (GABAPENTIN) 1 at bedtime  #90 x 3   Entered by:   Rock Nephew CMA   Authorized by:   Jacques Navy MD   Signed by:   Rock Nephew CMA on 04/08/2009   Method used:   Faxed to ...       [facility]* (mail-order)             ,          Ph: [phone]       Fax: [phone]   RxID:   [id number]

## 2010-02-09 NOTE — Procedures (Signed)
Summary: Colon   Colonoscopy  Procedure date:  11/28/2002  Findings:      Location:  [facility].    Procedures Next Due Date:    Colonoscopy: 12/2007  Patient Name: Beth Holden MRN:  Procedure Procedures: Colonoscopy CPT: 53664.    with Hot Biopsy(s)CPT: [id number].  Personnel: Endoscopist: Venita Lick. Russella Dar, MD, Clementeen Graham.  Referred By: Rudi Heap, MD.  Exam Location: Exam performed in Outpatient Clinic. Outpatient  Patient Consent: Procedure, Alternatives, Risks and Benefits discussed, consent obtained, from patient. Consent was obtained by the RN.  Indications  Evaluation of: Positive fecal occult blood test  Symptoms: Hematochezia.  History  Current Medications: Patient is not currently taking Coumadin.  Pre-Exam Physical: Performed Nov 28, 2002. Entire physical exam was normal.  Exam Exam: Extent of exam reached: Cecum, extent intended: Cecum.  The cecum was identified by appendiceal orifice and IC valve. Colon retroflexion performed. ASA Classification: II. Tolerance: good.  Monitoring: Pulse and BP monitoring, Oximetry used. Supplemental O2 given.  Colon Prep Used Golytely for colon prep. Prep results: good.  Sedation Meds: Patient assessed and found to be appropriate for moderate (conscious) sedation. Fentanyl 100 mcg. given IV. Versed 7 mg. given IV.  Findings NORMAL EXAM: Splenic Flexure.  POLYP: Transverse Colon, Maximum size: 4 mm. sessile polyp. Procedure:  hot biopsy, removed, retrieved, Polyp sent to pathology. ICD9: Colon Polyps: 211.3.  MULTIPLE POLYPS: Descending Colon to Sigmoid Colon. minimum size 3 mm, maximum size 4 mm. Procedure:  hot biopsy, removed, retrieved, 3 polyps Polyps sent to pathology. ICD9: Colon Polyps: 211.3.  NORMAL EXAM: Cecum to Hepatic Flexure.  HEMORRHOIDS: Internal. Size: Small. Not bleeding. Not thrombosed. ICD9: Hemorrhoids, Internal: 455.0.   Assessment  Diagnoses: 211.3: Colon Polyps.   455.0: Hemorrhoids, Internal.   Events  Unplanned Interventions: No intervention was required.  Unplanned Events: There were no complications. Plans  Post Exam Instructions: No aspirin or non-steroidal containing medications: 2 weeks.  Medication Plan: Await pathology. Continue current medications.  Patient Education: Patient given standard instructions for: Polyps. Hemorrhoids.  Disposition: After procedure patient sent to recovery. After recovery patient sent home.  Scheduling/Referral: Colonoscopy, to [facility] T. Russella Dar, MD, [facility], if polyp(s) adenomatous, around Nov 28, 2007.    CC: Rudi Heap, MD  This report was created from the original endoscopy report, which was reviewed and signed by the above listed endoscopist.

## 2010-02-09 NOTE — Progress Notes (Signed)
  Phone Note Outgoing Call   Summary of Call: Patient will need an appointment to discuss better control of her diabetes with a A1C of 9% Thanks Initial call taken by: Jacques Navy MD,  October 28, 2007 12:57 PM

## 2010-02-09 NOTE — Progress Notes (Signed)
Summary: Levimir  Phone Note Call from Patient Call back at [facility] Phone [phone]   Summary of Call: Pt requested 3 mth supply Levimir Pens 35 u qday  #15 ok per Dr Debby Bud. And Ok to give pt samples.  Initial call taken by: Lamar [place],  December 28, 2006 8:28 AM  Follow-up for Phone Call        Lf mess for pt to call office back Follow-up by: Lamar [place],  December 28, 2006 1:07 PM  Additional Follow-up for Phone Call Additional follow up Details #1::        pt picked up samples & rx Additional Follow-up by: Lamar [place],  December 28, 2006 6:23 PM      Prescriptions: LEVEMIR FLEXPEN 100 UNIT/ML  SOLN (INSULIN DETEMIR) as directed.  #15 x 3   Entered and Authorized by:   Lamar [place]   Signed by:   Lamar Sprinkles on 12/28/2006   Method used:   Print then Give to Patient   RxID:   [id number]

## 2010-02-09 NOTE — Letter (Signed)
   [facility] [address] [place], [place]  [address] Phone: [phone]      February 01, 2007   Beth Holden [address] [place], [place] [address]  RE:  LAB RESULTS  Dear  [name],  The following is an interpretation of your most recent lab tests.  Please take note of any instructions provided or changes to medications that have resulted from your lab work.  LIPID PANEL:  Good - no changes needed Triglyceride: 75   Cholesterol: 170   LDL: 100   HDL: 54.8   Chol/HDL%:  3.1 CALC  THYROID STUDIES:  Thyroid studies normal TSH: 1.63     DIABETIC STUDIES:  Poor - schedule a follow-up appointment soon HgbA1C: 9.0      Blood sugars are out of control. Please come see me so we can make adjustments in your medication regimen.   Sincerely Yours,    Jacques Navy MD

## 2010-02-09 NOTE — Assessment & Plan Note (Signed)
Summary: 1 MO ROA/NML   Vital Signs:  Patient Profile:   65 Years Old Female Height:     67 inches Weight:      193 pounds Temp:     98.7 degrees F oral Pulse rate:   90 / minute BP sitting:   143 / 74  (left arm)  Vitals Entered By: Zackery Barefoot CMA (December 05, 2006 1:21 PM)                 PCP:  Tai Syfert  Chief Complaint:  FOLLOW-UP.  History of Present Illness: follow-up after starting levemir. She reports that she is doing well with the pen. She has only episode of bruising. Her CBGs in AM are in the 130 range with occasional excursions both high and low.  She has developed burning dysesthesia of the soles and itching of th fingers.  Hypertension is a little up.  Current Allergies: No known allergies       Physical Exam  General:     Well-developed,well-nourished,in no acute distress; alert,appropriate and cooperative throughout examination Neurologic:     decreased deep vibratory sensation at the hands. Good capillary refill and pulses.    Impression & Recommendations:  Problem # 1:  DIABETES MELLITUS, TYPE II (ICD-250.00)  Her updated medication list for this problem includes:    Enalapril Maleate 20 Mg Tabs (Enalapril maleate) ..... Once daily    Metformin Hcl 1000 Mg Tabs (Metformin hcl) .Marland Kitchen..Marland Kitchen Two times a day    Levemir Flexpen 100 Unit/ml Soln (Insulin detemir) .Marland Kitchen... As directed.  doing much better with better CBGs  Plan: continue present regimen.  Orders: Diabetic Clinic Referral (Diabetic)   Problem # 2:  DIABETIC PERIPHERAL NEUROPATHY (ICD-250.60)  Her updated medication list for this problem includes:    Enalapril Maleate 20 Mg Tabs (Enalapril maleate) ..... Once daily    Metformin Hcl 1000 Mg Tabs (Metformin hcl) .Marland Kitchen..Marland Kitchen Two times a day    Levemir Flexpen 100 Unit/ml Soln (Insulin detemir) .Marland Kitchen... As directed.  Having some grogginess with the elavil and she is not well controlled.  Plan: trial of gabapentin with titration of dose  starting at 100mg  at bedtime and increasing q 3 days as needed to a maximum dose of 1800mg  / day.   Problem # 3:  UNSPECIFIED ESSENTIAL HYPERTENSION (ICD-401.9)  Her updated medication list for this problem includes:    Enalapril Maleate 20 Mg Tabs (Enalapril maleate) ..... Once daily  BP today: 143/74 Prior BP: 137/89 (11/08/2006)  Labs Reviewed: Chol: 171 (10/27/2006)   HDL: 51.7 (10/27/2006)   LDL: 98 (10/27/2006)   TG: 109 (10/27/2006)  suboptimal control. Plan: increase enalapril to 20mg  daily.   Complete Medication List: 1)  Enalapril Maleate 20 Mg Tabs (Enalapril maleate) .... Once daily 2)  Nexium 40 Mg Cpdr (Esomeprazole magnesium) .... Once daily 3)  Metformin Hcl 1000 Mg Tabs (Metformin hcl) .... Two times a day 4)  Crestor 5 Mg Tabs (Rosuvastatin calcium) .Marland Kitchen.. 1 by mouth at bedtime 5)  Cetirizine Hcl 10 Mg Tabs (Cetirizine hcl) .Marland Kitchen.. 1 by mouth every morning 6)  Tylenol Extra Strength 500 Mg Tabs (Acetaminophen) .... As needed 7)  Levemir Flexpen 100 Unit/ml Soln (Insulin detemir) .... As directed. 8)  Gabapentin 100 Mg Caps (Gabapentin) .... As directed   Patient Instructions: 1)  Please schedule a follow-up appointment in 2 months. 2)  Peripheral neuropathy: stop the elavil. Start gabapentin 100mg  tabs one at bedtime. Increase every three days as needed for on-going discomfort.  If you get to 300mg s at bedtime the next step will be 300mg  twice a day. if that is not doing the job we will go to 300mg  three times a day. If that isn't doing the job contact me (e-mail is best) before going any higher in the dose. 3)  Diabetes- looking much better. Continue present regimen and dose of levemir. 4)  hypertension- suboptimal control. Plan-increase enalapril to 20mg  daily. 5)  Please return for a FASTING Lipid Profile in 8 weeks. 272.4 6)  It is important that your Diabetic A1c level 8 weeks 250.03. TSH, FT4     Prescriptions: ENALAPRIL MALEATE 20 MG  TABS (ENALAPRIL MALEATE)  once daily  #90 x 3   Entered and Authorized by:   Jacques Navy MD   Signed by:   Jacques Navy MD on 12/05/2006   Method used:   Print then Give to Patient   RxID:   [id number] GABAPENTIN 100 MG  CAPS (GABAPENTIN) as directed  #90 x 0   Entered and Authorized by:   Jacques Navy MD   Signed by:   Jacques Navy MD on 12/05/2006   Method used:   Print then Give to Patient   RxID:   [id number]  ]

## 2010-02-09 NOTE — Assessment & Plan Note (Signed)
Summary: DR MEN PT--SORE THROAT-SWOLLEN NECK GLANDS-HEADACHE--STC   Vital Signs:  Patient profile:   65 year old female Weight:      179 pounds BMI:     28.14 Temp:     98.1 degrees F oral BP sitting:   120 / 80  (left arm)  Vitals Entered By: Doristine Devoid CMA (October 31, 2009 9:09 AM) CC: sore throat and cough along w/ ears itching    History of Present Illness: 65 yo woman here today w/ cough and sore throat.  has swollen glands bilaterally which are causing ear pressure.  also c/o 'ears are itching real bad'.  sxs started 2 days ago.  husband also sick.  denies fevers.  cough is intermittantly productive of yellow sputum.  currently taking zyrtec for allergies.  sees Dr Corinda Gubler for allergies (hasn't seen in quite some time).  hasn't taken any OTC meds for sxs.  + HAs, mild facial pain/pressure.  despite Codeine allergy has taken Tussionex w/out difficulty.  Current Medications (verified): 1)  Enalapril Maleate 20 Mg  Tabs (Enalapril Maleate) .... Once Daily 2)  Nexium 40 Mg  Cpdr (Esomeprazole Magnesium) .... One Tablet By Mouth Two Times A Day 3)  Metformin Hcl 1000 Mg  Tabs (Metformin Hcl) .... Two Times A Day 4)  Crestor 5 Mg  Tabs (Rosuvastatin Calcium) .Marland Kitchen.. 1 By Mouth At Bedtime 5)  Cetirizine Hcl 10 Mg  Tabs (Cetirizine Hcl) .Marland Kitchen.. 1 By Mouth Every Morning 6)  Tylenol Extra Strength 500 Mg  Tabs (Acetaminophen) .... As Needed 7)  Gabapentin 300 Mg  Caps (Gabapentin) .Marland Kitchen.. 1 At Bedtime 8)  Amitriptyline Hcl 25 Mg  Tabs (Amitriptyline Hcl) .Marland Kitchen.. 1 At Bedtime For Neuropathy 9)  Bd Ultra-Fine Pen Needles 29g X 12.17mm  Misc (Insulin Pen Needle) .... Use As Directed 10)  Onetouch Ultra Test   Strp (Glucose Blood) .... Two Times A Day Dx: 250.01 11)  Byetta 10 Mcg Pen 10 Mcg/0.48ml  Soln (Exenatide) .... Inject 10 Mcg  Two Times A Day  Allergies (verified): 1)  ! Codeine 2)  ! Darvon  Past History:  Past Medical History: Last updated: 10/31/2007 Allergic rhinitis Colonic  polyps, hx of - adenoma 02/1999 Diabetes mellitus, type II GERD Hyperlipidemia IBS Hypertension Peripheral neuropathy Erosive esophagitis  Review of Systems      See HPI  Physical Exam  General:  WNWD white female Head:  TTP over maxillary>frontal sinus Eyes:  no injxn or inflammation Ears:  EACs clear, TM's normal Nose:  + congestion Mouth:  posterior pharynx with erythema, without exudate Neck:  bilateral ant LAD Lungs:  normal respiratory effort, normal breath sounds, no crackles, and no wheezes.  almost continuous dry cough Heart:  normal rate and regular rhythm.     Impression & Recommendations:  Problem # 1:  SINUSITIS - ACUTE-NOS (ICD-461.9) Assessment New pt's PE consistent w/ sinus infxn.  start Augmentin as pt has had amox w/in 30 days.  reports she has had Tussionex in past w/out difficulty- script given for cough.  reviewed supportive care and red flags that should prompt return.  Pt expresses understanding and is in agreement w/ this plan. The following medications were removed from the medication list:    Amoxicillin 875 Mg Tabs (Amoxicillin) .Marland Kitchen... 1 by mouth two times a day x 7 for pharyngitis Her updated medication list for this problem includes:    Tussionex Pennkinetic Er 10-8 Mg/42ml Lqcr (Hydrocod polst-chlorphen polst) .Marland Kitchen... 1 tsp q12 as needed for cough.  may cause drowsiness.  disp    Augmentin 875-125 Mg Tabs (Amoxicillin-pot clavulanate) .Marland Kitchen... 1 by mouth 2 times daily  Problem # 2:  ALLERGIC RHINITIS (ICD-477.9) Assessment: Unchanged pt's itchy ears are most likely related to allergies as canals are normal appearing.  script for DermOtic given. Her updated medication list for this problem includes:    Cetirizine Hcl 10 Mg Tabs (Cetirizine hcl) .Marland Kitchen... 1 by mouth every morning  Complete Medication List: 1)  Enalapril Maleate 20 Mg Tabs (Enalapril maleate) .... Once daily 2)  Nexium 40 Mg Cpdr (Esomeprazole magnesium) .... One tablet by mouth two  times a day 3)  Metformin Hcl 1000 Mg Tabs (Metformin hcl) .... Two times a day 4)  Crestor 5 Mg Tabs (Rosuvastatin calcium) .Marland Kitchen.. 1 by mouth at bedtime 5)  Cetirizine Hcl 10 Mg Tabs (Cetirizine hcl) .Marland Kitchen.. 1 by mouth every morning 6)  Tylenol Extra Strength 500 Mg Tabs (Acetaminophen) .... As needed 7)  Gabapentin 300 Mg Caps (Gabapentin) .Marland Kitchen.. 1 at bedtime 8)  Amitriptyline Hcl 25 Mg Tabs (Amitriptyline hcl) .Marland Kitchen.. 1 at bedtime for neuropathy 9)  Bd Ultra-fine Pen Needles 29g X 12.27mm Misc (Insulin pen needle) .... Use as directed 10)  Onetouch Ultra Test Strp (Glucose blood) .... Two times a day dx: 250.01 11)  Byetta 10 Mcg Pen 10 Mcg/0.64ml Soln (Exenatide) .... Inject 10 mcg  two times a day 12)  Tussionex Pennkinetic Er 10-8 Mg/71ml Lqcr (Hydrocod polst-chlorphen polst) .Marland Kitchen.. 1 tsp q12 as needed for cough.  may cause drowsiness.  disp 13)  Augmentin 875-125 Mg Tabs (Amoxicillin-pot clavulanate) .Marland Kitchen.. 1 by mouth 2 times daily 14)  Dermotic 0.01 % Oil (Fluocinolone acetonide) .... 5 drops in affected ear two times a day x7 days.  Patient Instructions: 1)  This appears to be a sinus infection 2)  Take the Augmentin as directed- take w/ food to avoid upset stomach 3)  Take the Tussionex as needed for cough- will cause drowsiness 4)  You can add Mucinex or Delsym as needed for cough 5)  Tylenol or ibuprofen for pain or fever (this includes sore throat pain) 6)  Use the ear drops as needed for ear itching 7)  Drink plenty of fluids 8)  Hang in there!! Prescriptions: DERMOTIC 0.01 % OIL (FLUOCINOLONE ACETONIDE) 5 drops in affected ear two times a day x7 days.  #20 ml x 1   Entered and Authorized by:   Neena Rhymes MD   Signed by:   Neena Rhymes MD on 10/31/2009   Method used:   Print then Give to Patient   RxID:   [id number] AUGMENTIN 875-125 MG TABS (AMOXICILLIN-POT CLAVULANATE) 1 by mouth 2 times daily  #20 x 0   Entered and Authorized by:   Neena Rhymes MD   Signed  by:   Neena Rhymes MD on 10/31/2009   Method used:   Print then Give to Patient   RxID:   [id number] TUSSIONEX PENNKINETIC ER 10-8 MG/5ML LQCR (HYDROCOD POLST-CHLORPHEN POLST) 1 tsp Q12 as needed for cough.  may cause drowsiness.  disp  #180ml x 0   Entered and Authorized by:   Neena Rhymes MD   Signed by:   Neena Rhymes MD on 10/31/2009   Method used:   Print then Give to Patient   RxID:   [id number]    Orders Added: 1)  Est. Patient Level III [[address]]

## 2010-02-09 NOTE — Assessment & Plan Note (Signed)
Summary: sore throat per phone note/men pt/cd   Vital Signs:  Patient profile:   65 year old female Height:      67 inches Weight:      174.75 pounds O2 Sat:      97 % on Room air Temp:     98.5 degrees F oral Pulse rate:   124 / minute BP sitting:   146 / 78  (right arm) Cuff size:   large  Vitals Entered By: Rock Nephew CMA (Jun 05, 2008 2:16 PM)  O2 Flow:  Room air  Primary Care Provider:  [facility]   History of Present Illness: I am seeing her for the first time with a three-day history of sore throat, body aches, lymphadenopathy, head congestion but no cough.  Current Medications (verified): 1)  Enalapril Maleate 20 Mg  Tabs (Enalapril Maleate) .... Once Daily 2)  Nexium 40 Mg  Cpdr (Esomeprazole Magnesium) .... One Tablet By Mouth Two Times A Day 3)  Metformin Hcl 1000 Mg  Tabs (Metformin Hcl) .... Two Times A Day 4)  Crestor 5 Mg  Tabs (Rosuvastatin Calcium) .Marland Kitchen.. 1 By Mouth At Bedtime 5)  Cetirizine Hcl 10 Mg  Tabs (Cetirizine Hcl) .Marland Kitchen.. 1 By Mouth Every Morning 6)  Tylenol Extra Strength 500 Mg  Tabs (Acetaminophen) .... As Needed 7)  Gabapentin 300 Mg  Caps (Gabapentin) .Marland Kitchen.. 1 At Bedtime 8)  Amitriptyline Hcl 25 Mg  Tabs (Amitriptyline Hcl) .Marland Kitchen.. 1 At Bedtime For Neuropathy 9)  Bd Ultra-Fine Pen Needles 29g X 12.27mm  Misc (Insulin Pen Needle) .... Use As Directed 10)  Onetouch Ultra Test   Strp (Glucose Blood) .... Two Times A Day Dx: 250.01 11)  Byetta 10 Mcg Pen 10 Mcg/0.71ml  Soln (Exenatide) .... Inject 10 Mcg  Two Times A Day  Allergies (verified): 1)  ! Codeine 2)  ! Darvon  Past History:  Past Medical History: Reviewed history from 10/31/2007 and no changes required. Allergic rhinitis Colonic polyps, hx of - adenoma 02/1999 Diabetes mellitus, type II GERD Hyperlipidemia IBS Hypertension Peripheral neuropathy Erosive esophagitis  Past Surgical History: Reviewed history from 01/26/2007 and no changes required. Hysterectomy-vaginal  '01  Oophorectomy-'01 Tubal ligation Rotator cuff repair - left  Family History: Reviewed history from 10/26/2007 and no changes required. mother - deceased @66  lung cancer father - 87: DM, CAD, Lipid, HTN, a little dementia very positive for diabetes in female kinship Neg - breast or colon cancer  Social History: Reviewed history from 10/26/2007 and no changes required. 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  The patient denies anorexia, fever, peripheral edema, prolonged cough, hemoptysis, and abdominal pain.   ENT:  Complains of difficulty swallowing and sore throat; denies decreased hearing, ear discharge, earache, hoarseness, nasal congestion, nosebleeds, postnasal drainage, ringing in ears, and sinus pressure.  Physical Exam  General:  alert, well-developed, well-nourished, and well-hydrated.   Head:  normocephalic and atraumatic.   Mouth:  she has mild diffuse erythema posteriorly  with mild bilateral tonsillar hypertrophy. There is no crowding or exudate.pharynx pink and moist and no lesions.   Neck:  cervical lymphadenopathy.   Lungs:  Normal respiratory effort, chest expands symmetrically. Lungs are clear to auscultation, no crackles or wheezes. Heart:  Normal rate and regular rhythm. S1 and S2 normal without gallop, murmur, click, rub  or other extra sounds. Abdomen:  non-tender, no distention, no masses, no hepatomegaly, and no splenomegaly.   Msk:  normal ROM, no joint tenderness, and no joint swelling.   Pulses:  R and L carotid,radial,femoral,dorsalis pedis and posterior tibial pulses are full and equal bilaterally Extremities:  No clubbing, cyanosis, edema, or deformity noted with normal full range of motion of all joints.   Neurologic:  No cranial nerve deficits noted. Station and gait are normal. Plantar reflexes are  down-going bilaterally. DTRs are symmetrical throughout. Sensory, motor and coordinative functions appear intact. Skin:  turgor normal, color normal, and no rashes.   Cervical Nodes:  R anterior LN tender and L anterior LN tender.   Axillary Nodes:  No palpable lymphadenopathy Psych:  Cognition and judgment appear intact. Alert and cooperative with normal attention span and concentration. No apparent delusions, illusions, hallucinations   Impression & Recommendations:  Problem # 1:  TONSILLITIS (ICD-463) Assessment New  Orders: Admin of Therapeutic Inj  intramuscular or subcutaneous ([address]) Depo- Medrol 40mg  (J1030) Depo- Medrol 80mg  (J1040) Bicillin LA 1.2 million units Injection (U0454)  Complete Medication List: 1)  Enalapril Maleate 20 Mg Tabs (Enalapril maleate) .... Once daily 2)  Nexium 40 Mg Cpdr (Esomeprazole magnesium) .... One tablet by mouth two times a day 3)  Metformin Hcl 1000 Mg Tabs (Metformin hcl) .... Two times a day 4)  Crestor 5 Mg Tabs (Rosuvastatin calcium) .Marland Kitchen.. 1 by mouth at bedtime 5)  Cetirizine Hcl 10 Mg Tabs (Cetirizine hcl) .Marland Kitchen.. 1 by mouth every morning 6)  Tylenol Extra Strength 500 Mg Tabs (Acetaminophen) .... As needed 7)  Gabapentin 300 Mg Caps (Gabapentin) .Marland Kitchen.. 1 at bedtime 8)  Amitriptyline Hcl 25 Mg Tabs (Amitriptyline hcl) .Marland Kitchen.. 1 at bedtime for neuropathy 9)  Bd Ultra-fine Pen Needles 29g X 12.58mm Misc (Insulin pen needle) .... Use as directed 10)  Onetouch Ultra Test Strp (Glucose blood) .... Two times a day dx: 250.01 11)  Byetta 10 Mcg Pen 10 Mcg/0.41ml Soln (Exenatide) .... Inject 10 mcg  two times a day  Patient Instructions: 1)  Please schedule a follow-up appointment in 2 weeks. 2)  Get plenty of rest, drink lots of clear liquids, and use Tylenol or Ibuprofen for fever and comfort. Return in 7-10 days if you're not better:sooner if you're feeling worse.   Medication Administration  Injection # 1:    Medication: Bicillin LA 1.2 million  units Injection    Diagnosis: TONSILLITIS (ICD-463)    Route: IM    Site: RUOQ gluteus    Exp Date: 05/2010    Lot #: [id number]    Mfr: king    Patient tolerated injection without complications    Given by: Rock Nephew CMA (Jun 05, 2008 2:45 PM)  Injection # 3:    Medication: Depo- Medrol 80mg     Diagnosis: TONSILLITIS (ICD-463)    Route: IM    Site: R deltoid    Exp Date: 08/2010    Lot #: [id number]    Mfr: pfizer    Patient tolerated injection without complications    Given by: Rock Nephew CMA (Jun 05, 2008 2:46 PM)  Injection # 4:    Medication: Depo- Medrol 40mg     Diagnosis: TONSILLITIS (ICD-463)    Route: IM    Site: R deltoid    Exp Date: 08/2010    Lot #: [id number]    Mfr: pfizer    Patient tolerated injection without complications    Given by: Rock Nephew CMA (Jun 05, 2008 2:46 PM)  Orders Added: 1)  Admin of Therapeutic Inj  intramuscular or subcutaneous [96[address]] 2)  Depo- Medrol 40mg  [J1030] 3)  Depo- Medrol 80mg  [J1040] 4)  Bicillin LA 1.2 million units Injection [J0570] 5)  Est. Patient Level III [[address]]  Laboratory Results    Other Tests  Rapid Strep: negative

## 2010-02-09 NOTE — Assessment & Plan Note (Signed)
Vital Signs:  Patient Profile:   65 Years Old Female Height:     67 inches Weight:      204 pounds Temp:     98.2 degrees F oral Pulse rate:   92 / minute BP sitting:   136 / 82  (left arm)                 PCP:  Monzerrat Wellen   History of Present Illness: pt presents c/o severe burning dysesthesia both feet. Her symptoms are worse at night. She has involvement of both the dorsal and plantar aspect of her feet. The sensation is starting to rise up her leg. She also reports minor paresthesia of both hands.  Pt is a known, poorly controlled diabetic. She is non-adherent to a regular schedule of office visits. Her last A1C was 10.1% April of '06.   Lipids have also been a problem. The baseline LDL was 177, with a good response to vytorin resulting in an LDL of 77. However she stopped medication due to muscle discomfort, similar to what she experienced with Lipitor.  Pt is concerned for progressive alopecia and wonders if it is medication related.  Current Allergies: No known allergies   Past Medical History:    Allergic rhinitis    Colonic polyps, hx of    Diabetes mellitus, type II    GERD    Hyperlipidemia    IBS  Past Surgical History:    Hysterectomy-vaginal  '01    Oophorectomy-'01   Family History:    mother - lung cancer    very positive for diabetes in female kinship  Social History:    Married    Divorced    Never Smoked    Occupation: Paramedic with [employer]   Risk Factors:  Tobacco use:  never Alcohol use:  no Exercise:  no Seatbelt use:  100 %  Mammogram History:     Date of Last Mammogram:  06/10/2005    Results:  Normal Bilateral   Colonoscopy History:     Date of Last Colonoscopy:  12/03/2002    Results:  Hyperplastic Polyp    Review of Systems       The patient complains of dyspnea on exhertion.  The patient denies anorexia, fever, weight loss, vision loss, decreased hearing, hoarseness, chest pain, syncope, peripheral edema,  prolonged cough, hemoptysis, abdominal pain, melena, hematochezia, severe indigestion/heartburn, hematuria, incontinence, genital sores, muscle weakness, suspicious skin lesions, transient blindness, difficulty walking, depression, unusual weight change, abnormal bleeding, enlarged lymph nodes, angioedema, and breast masses.     Physical Exam  General:     overweight white female in NAD. Lungs:     normal respiratory effort.   Neurologic:     alert & oriented X3 and gait normal.   Psych:     Cognition and judgment appear intact. Alert and cooperative with normal attention span and concentration. No apparent delusions, illusions, hallucinations    Impression & Recommendations:  Problem # 1:  DIABETIC PERIPHERAL NEUROPATHY (ICD-250.60)  Her updated medication list for this problem includes:    Enalapril Maleate 10 Mg Tabs (Enalapril maleate) ..... Once daily    Glyburide Micronized 3 Mg Tabs (Glyburide micronized) ..... Once daily    Metformin Hcl 1000 Mg Tabs (Metformin hcl) .Marland Kitchen..Marland Kitchen Two times a day  Pt has a history of paresthesia previously documented as mild. Her symptoms have been progressive. Her diabetes control has been poor. Now with marked pain.  Plan: Elavil 25 mg  at bedtime which she can advance to 50mg . If no relief will then move to Lyrica starting at 50mg .         Will check routine lab including B12, TSH.   Problem # 2:  DIABETES MELLITUS, TYPE II (ICD-250.00)  Her updated medication list for this problem includes:    Enalapril Maleate 10 Mg Tabs (Enalapril maleate) ..... Once daily    Glyburide Micronized 3 Mg Tabs (Glyburide micronized) ..... Once daily    Metformin Hcl 1000 Mg Tabs (Metformin hcl) .Marland Kitchen..Marland Kitchen Two times a day  History of elevated A1C.  Plan: continue present medications. Lab: A1C   Problem # 3:  HYPERLIPIDEMIA (ICD-272.4) pt has stopped medication due to muscle pain. Explained the very high risk for ASVD and CAD.  Plan: trial of Crestor 5mg  once  daily, if needed due to discomfort, may go to every other day.         Lab in 3 weeks.  Problem # 4:  GERD (ICD-530.81)  Her updated medication list for this problem includes:    Nexium 40 Mg Cpdr (Esomeprazole magnesium) ..... 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 Premarin is a possible culprit for alopcia.  Plan: pt to discuss with her Gyn. I suspect she will stop Premarin.  Complete Medication List: 1)  Enalapril Maleate 10 Mg Tabs (Enalapril maleate) .... Once daily 2)  Glyburide Micronized 3 Mg Tabs (Glyburide micronized) .... Once daily 3)  Premarin 0.9 Mg Tabs (Estrogens conjugated) .... Once daily 4)  Nexium 40 Mg Cpdr (Esomeprazole magnesium) .... Once daily 5)  Metformin Hcl 1000 Mg Tabs (Metformin hcl) .... Two times a day   Patient Instructions: 1)  Call if problems with Crestor. 2)  Follow-up lab in 3 weeks. 3)  Foilow-up OV 4 weeks for full exam and lab review. 4)  It is important that you exercise regularly at least 20 minutes 5 times a week. If you develop chest pain, have severe difficulty breathing, or feel very tired , stop exercising immediately and seek medical attention. 5)  You need to lose weight. Consider a lower calorie diet and regular exercise.     ]

## 2010-02-09 NOTE — Progress Notes (Signed)
  Phone Note Call from Patient   Caller: Patient Call For: Jacques Navy MD Summary of Call: Pt called and was wanting Advice from MD on if she could take an OTC Alpha- Lipoic Acid 200mg  once a day. Please Advise Initial call taken by: Ami Bullins CMA,  November 19, 2008 10:24 AM  Follow-up for Phone Call        I don't know much about it but it sounds safe.  Follow-up by: Jacques Navy MD,  November 19, 2008 1:28 PM  Additional Follow-up for Phone Call Additional follow up Details #1::        left message on VM with MD advise Additional Follow-up by: Margaret Pyle, CMA,  November 19, 2008 3:30 PM

## 2010-02-09 NOTE — Progress Notes (Signed)
Summary: refill request  Phone Note Refill Request Message from:  Pharmacy on August 19, 2008 3:09 PM  Refills Requested: Medication #1:  BYETTA 10 MCG PEN 10 MCG/0.04ML  SOLN inject 10 mcg  two times a day.  Pharmacy requesting refill.  Initial call taken by: Beola Cord, CMA,  August 19, 2008 3:09 PM    Prescriptions: BYETTA 10 MCG PEN 10 MCG/0.04ML  SOLN (EXENATIDE) inject 10 mcg  two times a day  #3 month x 3   Entered by:   Beola Cord, CMA   Authorized by:   Jacques Navy MD   Signed by:   Beola Cord, CMA on 08/19/2008   Method used:   Electronically to        [facility] (mail-order)             ,          Ph: [phone]       Fax: [phone]   RxID:   [id number]

## 2010-02-10 ENCOUNTER — Encounter: Payer: Self-pay | Admitting: Internal Medicine

## 2010-02-10 ENCOUNTER — Telehealth: Payer: Self-pay | Admitting: Internal Medicine

## 2010-02-10 ENCOUNTER — Ambulatory Visit (INDEPENDENT_AMBULATORY_CARE_PROVIDER_SITE_OTHER): Payer: BC Managed Care – PPO | Admitting: Internal Medicine

## 2010-02-10 DIAGNOSIS — E119 Type 2 diabetes mellitus without complications: Secondary | ICD-10-CM

## 2010-02-11 DIAGNOSIS — Z23 Encounter for immunization: Secondary | ICD-10-CM

## 2010-02-11 NOTE — Progress Notes (Signed)
----   Converted from flag ---- ---- 02/05/2010 5:48 AM, Jacques Navy MD wrote: Ms. Johnsen needs an appointment in 7-10 days. thanks ------------------------------  Gave pt appt -phone---02/12/10 @ 9A

## 2010-02-12 ENCOUNTER — Ambulatory Visit: Payer: Self-pay | Admitting: Internal Medicine

## 2010-02-15 ENCOUNTER — Telehealth: Payer: Self-pay | Admitting: Internal Medicine

## 2010-02-17 NOTE — Assessment & Plan Note (Signed)
Summary: f/u for diabetes/per flag/cd   Vital Signs:  Patient profile:   65 year old female Height:      67 inches Weight:      175 pounds BMI:     27.51 O2 Sat:      96 % on Room air Temp:     97.7 degrees F oral Pulse rate:   92 / minute BP sitting:   138 / 80  (left arm) Cuff size:   regular  Vitals Entered By: Beth Holden CMA (February 04, 2010 10:48 AM)  O2 Flow:  Room air CC: pt here for f/u on diabetes, pt hasc/o pain in feet worse at night/ ab   Primary Care Provider:  Sherelle Holden  CC:  pt here for f/u on diabetes and pt hasc/o pain in feet worse at night/ ab.  History of Present Illness: Beth Holden presents for follow-up of diabetes and for preventive care. she has been feeling pretty well. She is taking her medication and trying to watch her diet. She is current with her gynecologist. She has had mammography.  She is 100% independent in ADLs. She has had n falls and her risks are small. She is cognitively intact and manages her life and family affairs  independently. She has no signs or symptoms of depression.  Current Medications (verified): 1)  Enalapril Maleate 20 Mg  Tabs (Enalapril Maleate) .... Once Daily 2)  Nexium 40 Mg  Cpdr (Esomeprazole Magnesium) .... One Tablet By Mouth Two Times A Day 3)  Metformin Hcl 1000 Mg  Tabs (Metformin Hcl) .... Two Times A Day 4)  Crestor 5 Mg  Tabs (Rosuvastatin Calcium) .Marland Kitchen.. 1 By Mouth At Bedtime 5)  Cetirizine Hcl 10 Mg  Tabs (Cetirizine Hcl) .Marland Kitchen.. 1 By Mouth Every Morning 6)  Tylenol Extra Strength 500 Mg  Tabs (Acetaminophen) .... As Needed 7)  Gabapentin 300 Mg  Caps (Gabapentin) .Marland Kitchen.. 1 Three Times A Day For Peripheral Neuropathy 8)  Amitriptyline Hcl 25 Mg  Tabs (Amitriptyline Hcl) .Marland Kitchen.. 1 At Bedtime For Neuropathy 9)  Bd Ultra-Fine Pen Needles 29g X 12.23mm  Misc (Insulin Pen Needle) .... Use As Directed 10)  Onetouch Ultra Test   Strp (Glucose Blood) .... Two Times A Day Dx: 250.01 11)  Byetta 10 Mcg Pen 10 Mcg/0.28ml  Soln  (Exenatide) .... Inject 10 Mcg  Two Times A Day 12)  Tussionex Pennkinetic Er 10-8 Mg/2ml Lqcr (Hydrocod Polst-Chlorphen Polst) .Marland Kitchen.. 1 Tsp Q12 As Needed For Cough.  May Cause Drowsiness.  Disp 13)  Dermotic 0.01 % Oil (Fluocinolone Acetonide) .... 5 Drops in Affected Ear Two Times A Day X7 Days.  Allergies (verified): 1)  ! Codeine 2)  ! Darvon  Past History:  Past Medical History: Last updated: 10/31/2007 Allergic rhinitis Colonic polyps, hx of - adenoma 02/1999 Diabetes mellitus, type II GERD Hyperlipidemia IBS Hypertension Peripheral neuropathy Erosive esophagitis  Past Surgical History: Last updated: 01/26/2007 Hysterectomy-vaginal  '01 Oophorectomy-'01 Tubal ligation Rotator cuff repair - left  Family History: Last updated: 10-29-2007 mother - deceased @66  lung cancer father - 17: DM, CAD, Lipid, HTN, a little dementia very positive for diabetes in female kinship Neg - breast or colon cancer  Social History: Last updated: 10/14/2008 HSG,  [employer] 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 (Summer '10)  Review of Systems  The patient denies anorexia, fever, weight loss, weight gain, decreased  hearing, chest pain, dyspnea on exertion, prolonged cough, hemoptysis, abdominal pain, severe indigestion/heartburn, muscle weakness, difficulty walking, unusual weight change, abnormal bleeding, enlarged lymph nodes, and breast masses.    Physical Exam  General:  Heavyset white woman in no distress Head:  normocephalic and atraumatic.   Eyes:  vision grossly intact, pupils equal, pupils round, and corneas and lenses clear.   Ears:  External ear exam shows no significant lesions or deformities.  Otoscopic examination reveals clear canals, tympanic membranes are intact bilaterally without bulging, retraction, inflammation or discharge. Hearing is grossly normal  bilaterally. Nose:  no external deformity and no external erythema.   Mouth:  Oral mucosa and oropharynx without lesions or exudates.  Teeth in good repair. Neck:  supple, no thyromegaly, and no carotid bruits.   Chest Wall:  no deformities.   Breasts:  deferred to gyn and mammography Lungs:  Normal respiratory effort, chest expands symmetrically. Lungs are clear to auscultation, no crackles or wheezes. Heart:  Normal rate and regular rhythm. S1 and S2 normal without gallop, murmur, click, rub or other extra sounds. Abdomen:  soft, normal bowel sounds, no guarding, and no hepatomegaly.   Genitalia:  deferred to gyn Msk:  normal ROM, no joint tenderness, no joint swelling, no joint warmth, no redness over joints, and no joint deformities.   Pulses:  2+ radial and DP pulses Neurologic:  alert & oriented X3, cranial nerves II-XII intact, and gait normal.   Skin:  turgor normal, color normal, no rashes, no petechiae, and no ulcerations.   Cervical Nodes:  no anterior cervical adenopathy and no posterior cervical adenopathy.   Psych:  Oriented X3, memory intact for recent and remote, normally interactive, and good eye contact.    Diabetes Management Exam:    Foot Exam (with socks and/or shoes not present):       Sensory-Pinprick/Light touch:          Right medial foot (L-4): normal          Right dorsal foot (L-5): normal          Right lateral foot (S-1): normal       Sensory-other: diminished deep vibratory sensation       Inspection:          Right foot: abnormal             Comments: missing great toenail.Hammer toe deformities       Nails:          Right foot: missing great toe nail    Eye Exam:       Eye Exam done elsewhere          Date: 07/31/2009          Results: normal          Done by: Beth Holden   Impression & Recommendations:  Problem # 1:  ANEMIA, MILD (ICD-285.9) For lab with recommendations to follow  Orders: TLB-CBC Platelet - w/Differential (85025-CBCD) TLB-B12 +  Folate Pnl (16109_60454-U98/JXB) TLB-IBC Pnl (Iron/FE;Transferrin) (83550-IBC)  Addendum - Hemoglobin is in normal range. Iron studies are OK except for slightly low iron percent saturation.  Plan - otc iron supplement and iron rich diet.  Problem # 2:  UNSPECIFIED ESSENTIAL HYPERTENSION (ICD-401.9)  Her updated medication list for this problem includes:    Enalapril Maleate 20 Mg Tabs (Enalapril maleate) ..... Once daily  Orders: TLB-BMP (Basic Metabolic Panel-BMET) (80048-METABOL)  BP today: 138/80 Prior BP: 120/80 (10/31/2009)  Adequate control on present medications.  Problem #  3:  DIABETIC PERIPHERAL NEUROPATHY (ICD-250.60) Pain in legs is not controlled on present meds and doses  Plan - continue amytriptyline 25mg  at bedtime           increase gabapentin step-wise to 300mg  three times a day.  Her updated medication list for this problem includes:    Enalapril Maleate 20 Mg Tabs (Enalapril maleate) ..... Once daily    Metformin Hcl 1000 Mg Tabs (Metformin hcl) .Marland Kitchen..Marland Kitchen Two times a day    Byetta 10 Mcg Pen 10 Mcg/0.103ml Soln (Exenatide) ..... Inject 10 mcg  two times a day  Problem # 4:  HYPERLIPIDEMIA (ICD-272.4) Due for lab with recommendations to follow  Her updated medication list for this problem includes:    Crestor 5 Mg Tabs (Rosuvastatin calcium) .Marland Kitchen... 1 by mouth at bedtime  Orders: TLB-Lipid Panel (80061-LIPID) TLB-Hepatic/Liver Function Pnl (80076-HEPATIC)  Addendum - good control with LDL 84  Problem # 5:  DIABETES MELLITUS, TYPE II (ICD-250.00) On Bayetta and metformin. She has lost weight but her CBG is high.   Plan - A1C with recommendation to follow  Her updated medication list for this problem includes:    Enalapril Maleate 20 Mg Tabs (Enalapril maleate) ..... Once daily    Metformin Hcl 1000 Mg Tabs (Metformin hcl) .Marland Kitchen..Marland Kitchen Two times a day    Byetta 10 Mcg Pen 10 Mcg/0.60ml Soln (Exenatide) ..... Inject 10 mcg  two times a day  Orders: TLB-A1C /  Hgb A1C (Glycohemoglobin) (83036-A1C)  Addendum - A1C 10.4% - out of control  Plan - will change to basal insulin therapy.  Problem # 6:  Preventive Health Care (ICD-V70.0) History is unremarkable except for periheral neuropathy. Her exam is normal. Labs are within normal limits except for A1C. She is current with colorectal cancer screenig with last study in '09. Last mammogram Dec '11. Immunizations: Pneumonia and shingles vaccine today.  In summary - a very nice woman who is doing OK but is out of control in regard to her diabetes. Plan is to return and be instructed in basal insulin therapy.   Complete Medication List: 1)  Enalapril Maleate 20 Mg Tabs (Enalapril maleate) .... Once daily 2)  Nexium 40 Mg Cpdr (Esomeprazole magnesium) .... One tablet by mouth two times a day 3)  Metformin Hcl 1000 Mg Tabs (Metformin hcl) .... Two times a day 4)  Crestor 5 Mg Tabs (Rosuvastatin calcium) .Marland Kitchen.. 1 by mouth at bedtime 5)  Cetirizine Hcl 10 Mg Tabs (Cetirizine hcl) .Marland Kitchen.. 1 by mouth every morning 6)  Tylenol Extra Strength 500 Mg Tabs (Acetaminophen) .... As needed 7)  Gabapentin 300 Mg Caps (Gabapentin) .Marland Kitchen.. 1 three times a day for peripheral neuropathy 8)  Amitriptyline Hcl 25 Mg Tabs (Amitriptyline hcl) .Marland Kitchen.. 1 at bedtime for neuropathy 9)  Bd Ultra-fine Pen Needles 29g X 12.45mm Misc (Insulin pen needle) .... Use as directed 10)  Onetouch Ultra Test Strp (Glucose blood) .... Two times a day dx: 250.01 11)  Byetta 10 Mcg Pen 10 Mcg/0.4ml Soln (Exenatide) .... Inject 10 mcg  two times a day 12)  Tussionex Pennkinetic Er 10-8 Mg/75ml Lqcr (Hydrocod polst-chlorphen polst) .Marland Kitchen.. 1 tsp q12 as needed for cough.  may cause drowsiness.  disp 13)  Dermotic 0.01 % Oil (Fluocinolone acetonide) .... 5 drops in affected ear two times a day x7 days.  Other Orders: Pneumococcal Vaccine (78295) Admin 1st Vaccine (62130) Zoster (Shingles) Vaccine Live [phone]) Admin of Any Addtl Vaccine  (46962)  [name] Note: All  result statuses are Final unless otherwise noted.  Tests: (1) BMP (METABOL)   Sodium                    136 mEq/L                   135-145   Potassium                 4.2 mEq/L                   3.5-5.1   Chloride                  98 mEq/L                    96-112   Carbon Dioxide            28 mEq/L                    19-32   Glucose              [H]  167 mg/dL                   16-10   BUN                       12 mg/dL                    9-60   Creatinine                0.6 mg/dL                   4.5-4.0   Calcium                   9.7 mg/dL                   9.8-11.9   GFR                       107.54 mL/min               >60.00  Tests: (2) Hemoglobin A1C (A1C)   Hemoglobin A1C       [H]  10.4 %                      4.6-6.5     Glycemic Control Guidelines for People with Diabetes:     Non Diabetic:  <6%     Goal of Therapy: <7%     Additional Action Suggested:  >8%   Tests: (3) Lipid Panel (LIPID)   Cholesterol               146 mg/dL                   1-478     ATP III Classification            Desirable:  < 200 mg/dL                    Borderline High:  200 - 239 mg/dL               High:  > = 240 mg/dL   Triglycerides             85.0 mg/dL  0.0-149.0     Normal:  <150 mg/dL     Borderline High:  604 - 199 mg/dL   HDL                       54.09 mg/dL                 >81.19   VLDL Cholesterol          17.0 mg/dL                  1.4-78.2   LDL Cholesterol           84 mg/dL                    9-56  CHO/HDL Ratio:  CHD Risk                             3                    Men          Women     1/2 Average Risk     3.4          3.3     Average Risk          5.0          4.4     2X Average Risk          9.6          7.1     3X Average Risk          15.0          11.0                           Tests: (4) Hepatic/Liver Function Panel (HEPATIC)   Total Bilirubin           0.6 mg/dL                   2.1-3.0    Direct Bilirubin          0.1 mg/dL                   8.6-5.7   Alkaline Phosphatase      53 U/L                      39-117   AST                       27 U/L                      0-37   ALT                       22 U/L                      0-35   Total Protein             7.1 g/dL                    8.4-6.9   Albumin                   4.1 g/dL  3.5-5.2  Tests: (5) CBC Platelet w/Diff (CBCD)   White Cell Count          10.1 K/uL                   4.5-10.5   Red Cell Count            4.52 Mil/uL                 3.87-5.11   Hemoglobin                13.0 g/dL                   78.2-95.6   Hematocrit                38.2 %                      36.0-46.0   MCV                       84.6 fl                     78.0-100.0   MCHC                      34.0 g/dL                   21.3-08.6   RDW                       13.2 %                      11.5-14.6   Platelet Count       [H]  419.0 K/uL                  150.0-400.0   Neutrophil %              64.8 %                      43.0-77.0   Lymphocyte %              27.8 %                      12.0-46.0   Monocyte %                4.8 %                       3.0-12.0   Eosinophils%              1.8 %                       0.0-5.0   Basophils %               0.8 %                       0.0-3.0   Neutrophill Absolute      6.5 K/uL                    1.4-7.7   Lymphocyte Absolute       2.8 K/uL  0.7-4.0   Monocyte Absolute         0.5 K/uL                    0.1-1.0  Eosinophils, Absolute                             0.2 K/uL                    0.0-0.7   Basophils Absolute        0.1 K/uL                    0.0-0.1  Tests: (6) B12 + Folate Panel (B12/FOL)   Vitamin B12               212 pg/mL                   211-911   Folate                    22.4 ng/mL                  >5.9  Tests: (7) IBC Panel (IBC)   Iron                      67 ug/dL                    16-109   Transferrin               258.9 mg/dL                  604.5-409.8   Iron Saturation      [L]  18.5 %                      20.0-50.0Prescriptions: ONETOUCH ULTRA TEST   STRP (GLUCOSE BLOOD) two times a day Dx: 250.01  #180 x 3   Entered and Authorized by:   Jacques Navy MD   Signed by:   Jacques Navy MD on 02/04/2010   Method used:   Faxed to ...       [facility] ([facility])             , [place]         Ph: [phone]       Fax: [phone]   RxID:   [id number] BD ULTRA-FINE PEN NEEDLES 29G X 12.7MM  MISC (INSULIN PEN NEEDLE) use as directed  #90 x 3   Entered and Authorized by:   Jacques Navy MD   Signed by:   Jacques Navy MD on 02/04/2010   Method used:   Faxed to ...       [facility] ([facility])             , [place]         Ph: [phone]       Fax: [phone]   RxID:   [id number] AMITRIPTYLINE HCL 25 MG  TABS (AMITRIPTYLINE HCL) 1 at bedtime for neuropathy  #90 x 3   Entered and Authorized by:   Jacques Navy MD   Signed by:   Jacques Navy MD on 02/04/2010   Method used:   Faxed to ...       [facility] ([facility])             ,           Ph: [phone]       Fax: [phone]   RxID:   [id number] METFORMIN HCL 1000 MG  TABS (METFORMIN HCL) two times a day  #180 Tablet x 3   Entered and Authorized by:   Jacques Navy MD   Signed by:   Jacques Navy MD on 02/04/2010   Method used:   Faxed to ...       [facility] ([facility])             , [place]         Ph: [phone]       Fax: [phone]   RxID:   [id number] NEXIUM 40 MG  CPDR (ESOMEPRAZOLE MAGNESIUM) one tablet by mouth two times a day  #180 Capsule x 2   Entered and Authorized by:   Jacques Navy MD   Signed by:   Jacques Navy MD on 02/04/2010   Method used:   Faxed to ...       [facility] ([facility])             , [place]         Ph: [phone]       Fax: [phone]   RxID:   [id number] ENALAPRIL MALEATE 20 MG  TABS (ENALAPRIL MALEATE) once daily  #90 x 3   Entered and Authorized by:   Jacques Navy MD    Signed by:   Jacques Navy MD on 02/04/2010   Method used:   Faxed to ...       [facility] ([facility])             , [place]         Ph: [phone]       Fax: [phone]   RxID:   [id number] GABAPENTIN 300 MG  CAPS (GABAPENTIN) 1 three times a day for peripheral neuropathy  #270 x 3   Entered and Authorized by:   Jacques Navy MD   Signed by:   Jacques Navy MD on 02/04/2010   Method used:   Faxed to ...       [facility] ([facility])             , [place]         Ph: [phone]       Fax: [phone]   RxID:   [id number]    Orders Added: 1)  Pneumococcal Vaccine [90732] 2)  Admin 1st Vaccine [90471] 3)  Zoster (Shingles) Vaccine Live [90736] 4)  Admin of Any Addtl Vaccine [90472] 5)  Est. Patient Level IV [52778] 6)  TLB-BMP (Basic Metabolic Panel-BMET) [80048-METABOL] 7)  TLB-A1C / Hgb A1C (Glycohemoglobin) [83036-A1C] 8)  TLB-Lipid Panel [80061-LIPID] 9)  TLB-Hepatic/Liver Function Pnl [80076-HEPATIC] 10)  TLB-CBC Platelet - w/Differential [85025-CBCD] 11)  TLB-B12 + Folate Pnl [82746_82607-B12/FOL] 12)  TLB-IBC Pnl (Iron/FE;Transferrin) [83550-IBC]   Immunizations Administered:  Pneumonia Vaccine:    Vaccine Type: Pneumovax    Site: right deltoid    Mfr: Merck    Dose: 0.5 ml    Route: IM    Given by: Ami Bullins CMA    Exp. Date: 06/04/2011    Lot #: [id number]    VIS given: 12/15/08 version given February 04, 2010.  Zostavax # 1:    Vaccine Type: Zostavax    Site: left arm    Mfr: Merck    Dose: 0.5 ml    Route: [place]  Given by: Ami Bullins CMA    Exp. Date: 08/28/2010    Lot #: [id number]    VIS given: 10/22/04 given February 04, 2010.   Immunizations Administered:  Pneumonia Vaccine:    Vaccine Type: Pneumovax    Site: right deltoid    Mfr: Merck    Dose: 0.5 ml    Route: IM    Given by: Ami Bullins CMA    Exp. Date: 06/04/2011    Lot #: [id number]    VIS given: 12/15/08 version given February 04, 2010.  Zostavax # 1:    Vaccine Type: Zostavax     Site: left arm    Mfr: Merck    Dose: 0.5 ml    Route: [place]    Given by: Ami Bullins CMA    Exp. Date: 08/28/2010    Lot #: [id number]    VIS given: 10/22/04 given February 04, 2010.  Laboratory Results   Blood Tests    Date/Time Reported: Ami Bullins CMA  February 04, 2010 10:57 AM   CBG Fasting:: 204mg /dL

## 2010-02-17 NOTE — Progress Notes (Signed)
Summary: LEVEMIR SIG  Phone Note Call from Patient   Summary of Call: [facility] needs detailed rx - needs sig w/correct dosing so insurance will pay. Updated rx and sent in Initial call taken by: Lamar Sprinkles, CMA,  February 10, 2010 4:07 PM    New/Updated Medications: BD ULTRA-FINE PEN NEEDLES 29G X 12.7MM  MISC (INSULIN PEN NEEDLE) use as directed with levemir once daily LEVEMIR FLEXPEN 100 UNIT/ML SOLN (INSULIN DETEMIR) at bedtime injection- on titration schedule starting at 25 units to go up to 44 if needed Prescriptions: BD ULTRA-FINE PEN NEEDLES 29G X 12.7MM  MISC (INSULIN PEN NEEDLE) use as directed with levemir once daily  #90 x 3   Entered by:   Lamar Sprinkles, CMA   Authorized by:   Jacques Navy MD   Signed by:   Lamar Sprinkles, CMA on 02/10/2010   Method used:   Electronically to        [facility] (retail)             ,          Ph: [phone]       Fax: [phone]   RxID:   [id number] LEVEMIR FLEXPEN 100 UNIT/ML SOLN (INSULIN DETEMIR) at bedtime injection- on titration schedule starting at 25 units to go up to 44 if needed  #3 mth x 3   Entered by:   Lamar Sprinkles, CMA   Authorized by:   Jacques Navy MD   Signed by:   Lamar Sprinkles, CMA on 02/10/2010   Method used:   Electronically to        [facility] (retail)             ,          Ph: [phone]       Fax: [phone]   RxID:   [id number]

## 2010-02-17 NOTE — Assessment & Plan Note (Signed)
Summary: PER FLAG/MEN--7-10 DY APPT--PHONE--STC   Vital Signs:  Patient profile:   65 year old female Weight:      175 pounds BMI:     27.51 Temp:     98.4 degrees F oral Pulse rate:   88 / minute BP sitting:   102 / 68  (left arm) Cuff size:   regular  Vitals Entered By: Lamar Sprinkles, CMA (February 10, 2010 11:10 AM)  Primary Care Provider:  Ajax Schroll   History of Present Illness: Ms Mattila returns for medication change due to A1C being elevated at 10.4% on bayetta and metformn. Chart reviewed: she was on levemir 44u at bedtime in '09 but because of a desire to loose weight was changed to byetta. She is now no longer controlled with byetta.  Current Medications (verified): 1)  Enalapril Maleate 20 Mg  Tabs (Enalapril Maleate) .... Once Daily 2)  Nexium 40 Mg  Cpdr (Esomeprazole Magnesium) .... One Tablet By Mouth Two Times A Day 3)  Metformin Hcl 1000 Mg  Tabs (Metformin Hcl) .... Two Times A Day 4)  Crestor 5 Mg  Tabs (Rosuvastatin Calcium) .Marland Kitchen.. 1 By Mouth At Bedtime 5)  Cetirizine Hcl 10 Mg  Tabs (Cetirizine Hcl) .Marland Kitchen.. 1 By Mouth Every Morning 6)  Tylenol Extra Strength 500 Mg  Tabs (Acetaminophen) .... As Needed 7)  Gabapentin 300 Mg  Caps (Gabapentin) .Marland Kitchen.. 1 Three Times A Day For Peripheral Neuropathy 8)  Amitriptyline Hcl 25 Mg  Tabs (Amitriptyline Hcl) .Marland Kitchen.. 1 At Bedtime For Neuropathy 9)  Bd Ultra-Fine Pen Needles 29g X 12.41mm  Misc (Insulin Pen Needle) .... Use As Directed 10)  Onetouch Ultra Test   Strp (Glucose Blood) .... Two Times A Day Dx: 250.01 11)  Byetta 10 Mcg Pen 10 Mcg/0.34ml  Soln (Exenatide) .... Inject 10 Mcg  Two Times A Day 12)  Dermotic 0.01 % Oil (Fluocinolone Acetonide) .... 5 Drops in Affected Ear Two Times A Day X7 Days.  Allergies (verified): 1)  ! Codeine 2)  ! Darvon PMH-FH-SH reviewed-no changes except otherwise noted  Review of Systems  The patient denies anorexia, fever, weight loss, weight gain, decreased hearing, chest pain, peripheral  edema, abdominal pain, muscle weakness, and enlarged lymph nodes.    Physical Exam  General:  Well-developed,well-nourished,in no acute distress; alert,appropriate and cooperative throughout examination Eyes:  C&S clear Neck:  supple and full ROM.   Lungs:  normal respiratory effort.   Heart:  normal rate and regular rhythm.   Neurologic:  alert & oriented X3 and gait normal.   Skin:  turgor normal and color normal.   Psych:  Oriented X3, normally interactive, and good eye contact.     Impression & Recommendations:  Problem # 1:  DIABETES MELLITUS, TYPE II (ICD-250.00) Patient not controlled on present regimen  Plan - stop byetta           start levemit at 25 units at bedtime and using a three day titration schedule raise dose to control = fasting CBG of 80-140.  Her updated medication list for this problem includes:    Enalapril Maleate 20 Mg Tabs (Enalapril maleate) ..... Once daily    Metformin Hcl 1000 Mg Tabs (Metformin hcl) .Marland Kitchen..Marland Kitchen Two times a day    Levemir Flexpen 100 Unit/ml Soln (Insulin detemir) .Marland Kitchen... At bedtime injection- on titration schedule starting at 25 units to go up to 44 if needed  Complete Medication List: 1)  Enalapril Maleate 20 Mg Tabs (Enalapril maleate) .... Once  daily 2)  Nexium 40 Mg Cpdr (Esomeprazole magnesium) .... One tablet by mouth two times a day 3)  Metformin Hcl 1000 Mg Tabs (Metformin hcl) .... Two times a day 4)  Crestor 5 Mg Tabs (Rosuvastatin calcium) .Marland Kitchen.. 1 by mouth at bedtime 5)  Cetirizine Hcl 10 Mg Tabs (Cetirizine hcl) .Marland Kitchen.. 1 by mouth every morning 6)  Tylenol Extra Strength 500 Mg Tabs (Acetaminophen) .... As needed 7)  Gabapentin 300 Mg Caps (Gabapentin) .Marland Kitchen.. 1 three times a day for peripheral neuropathy 8)  Amitriptyline Hcl 25 Mg Tabs (Amitriptyline hcl) .Marland Kitchen.. 1 at bedtime for neuropathy 9)  Bd Ultra-fine Pen Needles 29g X 12.63mm Misc (Insulin pen needle) .... Use as directed with levemir once daily 10)  Onetouch Ultra Test Strp  (Glucose blood) .... Two times a day dx: 250.01 11)  Levemir Flexpen 100 Unit/ml Soln (Insulin detemir) .... At bedtime injection- on titration schedule starting at 25 units to go up to 44 if needed 12)  Dermotic 0.01 % Oil (Fluocinolone acetonide) .... 5 drops in affected ear two times a day x7 days.  Patient Instructions: 1)  Restarting Levemir basal insulin therapy which you were on before at 44 units at bedtime.  2)  Plan - start levemir at 25units at bedtime. Will do a 3 day titration schedule: check fasting blood sugar every morning. If the reading is 140+ 3 days in a row increase the levemir by 3 units. Continue the metformin.  Prescriptions: LEVEMIR FLEXPEN 100 UNIT/ML SOLN (INSULIN DETEMIR) at bedtime injection- on titration schedule.  #15 x 3   Entered and Authorized by:   Jacques Navy MD   Signed by:   Jacques Navy MD on 02/10/2010   Method used:   Faxed to ...       [facility] (mail-order)             , [place]         Ph: [phone]       Fax: [phone]   RxID:   [id number]    Orders Added: 1)  Est. Patient Level III [36644]  Appended Document: PER FLAG/MEN--7-10 DY APPT--PHONE--STC    Clinical Lists Changes  Orders: Added new Service order of Tdap => 31yrs IM (03474) - Signed Added new Service order of Admin 1st Vaccine (25956) - Signed Observations: Added new observation of TD BOOST VIS: 11/28/07 version given February 11, 2010. (02/11/2010 8:15) Added new observation of TD BOOSTERLO: [id number] (02/11/2010 8:15) Added new observation of TD BOOST EXP: 10/30/2011 (02/11/2010 8:15) Added new observation of TD BOOSTERBY: Ami Bullins CMA (02/11/2010 8:15) Added new observation of TD BOOSTERRT: IM (02/11/2010 8:15) Added new observation of TDBOOSTERDSE: 0.5 ml (02/11/2010 8:15) Added new observation of TD BOOSTERMF: GlaxoSmithKline (02/11/2010 8:15) Added new observation of TD BOOST SIT: left deltoid (02/11/2010 8:15) Added new observation of TD BOOSTER:  Tdap (02/11/2010 8:15)       Immunizations Administered:  Tetanus Vaccine:    Vaccine Type: Tdap    Site: left deltoid    Mfr: GlaxoSmithKline    Dose: 0.5 ml    Route: IM    Given by: Ami Bullins CMA    Exp. Date: 10/30/2011    Lot #: [id number]    VIS given: 11/28/07 version given February 11, 2010.

## 2010-02-25 NOTE — Progress Notes (Signed)
Summary: [facility] ?   Phone Note From Pharmacy   Caller: [facility] [phone] REF# [id number] Summary of Call: [facility] is req a call back - ?'s about levemir.  Initial call taken by: Lamar Sprinkles, CMA,  February 15, 2010 10:13 AM  Follow-up for Phone Call        [facility] informed of correct directions on Levimir Follow-up by: Lamar Sprinkles, CMA,  February 15, 2010 5:53 PM

## 2010-04-30 ENCOUNTER — Other Ambulatory Visit: Payer: Self-pay | Admitting: Internal Medicine

## 2010-05-25 ENCOUNTER — Encounter: Payer: Self-pay | Admitting: *Deleted

## 2010-05-25 ENCOUNTER — Telehealth: Payer: Self-pay | Admitting: *Deleted

## 2010-05-25 ENCOUNTER — Ambulatory Visit (INDEPENDENT_AMBULATORY_CARE_PROVIDER_SITE_OTHER): Payer: BC Managed Care – PPO | Admitting: Internal Medicine

## 2010-05-25 VITALS — BP 112/70 | HR 104 | Temp 98.5°F | Wt 184.0 lb

## 2010-05-25 DIAGNOSIS — R21 Rash and other nonspecific skin eruption: Secondary | ICD-10-CM

## 2010-05-25 DIAGNOSIS — L080 Pyoderma: Secondary | ICD-10-CM

## 2010-05-25 MED ORDER — GLUCOSE BLOOD VI STRP: ORAL_STRIP | Status: DC

## 2010-05-25 MED ORDER — DOXYCYCLINE HYCLATE 100 MG PO TABS: 100.0000 mg | ORAL_TABLET | Freq: Two times a day (BID) | ORAL | Status: AC

## 2010-05-25 MED ORDER — "PEN NEEDLES 5/16"" 31G X 8 MM MISC": 1.0000 | Freq: Two times a day (BID) | Status: DC

## 2010-05-25 NOTE — Telephone Encounter (Signed)
Pt called req advisement regarding what she thinks are boils on her feet. She has 3 to 4 small areas on her foot and is very worried b/c she is diabetic. Scheduled for OV today for eval.

## 2010-05-26 NOTE — Progress Notes (Signed)
  Subjective:    Patient ID: Beth Holden, female    DOB: [birth year], 65 y.o.   MRN: [id number]  HPI Beth Holden presents for evaluation of inflammatory lesions on the left foot and ankle. She was at the beach recently but does not recall any insect bites or other forms of invenomation. She noticed the appearance this AM of several punctate pustules which during the coarse of the day have developed surrounding erythema. There is a stinging feeling but no frank pain. She has had no fever or chills. She does not report any lymphadenopathy.  I have reviewed the patient's medical history in detail and updated the computerized patient record.    Review of Systems Review of Systems  Constitutional:  Negative for fever, chills, activity change and unexpected weight change.  HENT:  Negative for hearing loss, ear pain, congestion, neck stiffness and postnasal drip.   Eyes: Negative for pain, discharge and visual disturbance.  Respiratory: Negative for chest tightness and wheezing.   Cardiovascular: Negative for chest pain and palpitations.       [No decreased exercise tolerance Gastrointestinal: [No change in bowel habit. No bloating or gas. No reflux or indigestion Genitourinary: Negative for urgency, frequency, flank pain and difficulty urinating.  Musculoskeletal: Negative for myalgias, back pain, arthralgias and gait problem.  Neurological: Negative for dizziness, tremors, weakness and headaches.  Hematological: Negative for adenopathy.  Psychiatric/Behavioral: Negative for behavioral problems and dysphoric mood.       Objective:   Physical Exam WNWD overweight white woman in no distress Derm - several small pustules on the left foot with erythema. No frank drainage. No tenderness, No heat to the lesions.       Assessment & Plan:  1. Rash - pustular. Early signs of infection.  Plan - warm soaks or compresses, cleanse with soap and water, do not manipulate lesion  Doxycycline 100mg  bid x 7 days for possible cellulitis.            Call if progressive lesions.

## 2010-05-28 NOTE — Op Note (Signed)
[facility]  Patient:    Beth Holden, Beth Holden                     MRN: [id number] Proc. Date: 12/15/99 Adm. Date:  [id number] Attending:  Minette Headland                           Operative Report  PREOPERATIVE DIAGNOSES:       Uterine enlargement, suspected adenomyosis; dysfunctional uterine bleeding persists in spite of cyclic hormonal therapy; intolerance of progestin supplementation.  POSTOPERATIVE DIAGNOSES:      Uterine enlargement, suspected adenomyosis; dysfunctional uterine bleeding persists in spite of cyclic hormonal therapy; intolerance of progestin supplementation.  OPERATIVE PROCEDURE:          Laparoscopically assisted vaginal hysterectomy and bilateral salpingo-oophorectomy.  SURGEON:                      Freddy Finner, M.D.  ANESTHESIA:                   General endotracheal.  ESTIMATED INTRAOPERATIVE BLOOD LOSS:  ______  cc.  INTRAOPERATIVE COMPLICATIONS:  None.  FINDINGS:                     Intraoperative findings are as noted in the postoperative diagnoses.  The uterus was irregular and slightly enlarged. Tubes and ovaries were normal.  There was no intraperitoneal pathology.  The appendix was not visualized.  There was no apparent upper abdominal abnormality.  Photographs were made of the operative findings and retained in my office record.  DESCRIPTION OF PROCEDURE:     Patient was admitted on the morning of surgery. She was placed in PAS hose.  She was given a gram of Cefotan IV preoperatively.  She was taken to the operating room and there, placed under adequate general endotracheal anesthesia and placed in the dorsal lithotomy position.  Abdomen, perineum and vagina were prepped in the usual fashion. Bladder was evacuated with a Robinson catheter.  A Hulka tenaculum was attached to the cervix under direct visualization.  Sterile drapes were applied.  A total of three incisions were made in the abdomen during  the procedure.  The initial incision was made at the umbilicus, through which a 12-mm disposable trocar was introduced.  Inspection revealed adequate placement with no evidence of injury on entry.  Pneumoperitoneum was allowed to accumulate.  A 5-mm trocar was placed through a small incision just above the symphysis and while transilluminating the anterior abdominal wall, a third incision was made in the right lower quadrant, approximately 8 to 10 cm lateral to the umbilicus and 6 to 8 cm inferior to it; through this, another 5-mm trocar was placed.  After careful systematic examination of pelvic and abdominal contents, the tripolar coagulation and division forceps were used with bipolar coagulation.  Device was used in the right lower quadrant port. The infundibulopelvic and upper broad ligaments were taken down systematically with stepwise fulguration and division; this included the uterine artery on the right side and just above the uterine artery on the left.  Some difficulty was experienced with bleeding on the left side, which was controlled with regular bipolar forceps through the operating channel of the laparoscope. Attention was then turned vaginally.  A posterior weighted retractor was placed.  Deaver retractors were used to retract the vaginal walls.  The cervix was grasped with a  Jacobs tenaculum and the Hulka tenaculum removed. Colpotomy incision was made while tenting the cul-de-sac with an Allis.  The incision was carried around the cervix with a scalpel.  The uterosacral pedicle was taken with curved Heaneys, divided sharply and ligated with 0 Monocryl in a Heaney fashion.  Bladder pillars were taken separately, divided sharply and ligated with 0 Monocryl.  Bladder was advanced off the cervix.  Cardinal ligament pedicles were taken with curved Heaneys, divided sharply and ligated with 0 Monocryl.  Anterior peritoneum was entered.  The upper cardinal ligaments and the  uterine artery on the patients left were then taken with separate pedicles, divided sharply and ligated with 0 Monocryl.  Uterus was delivered through the vaginal introitus.  Remaining pedicles were grasped with Heaneys and the uterus removed.  Each of these pedicles were ligated with free ties of 0 Monocryl.  Angles of the vagina were anchored to the uterosacrals with a mattress suture of 0 Monocryl. Uterosacrals were plicated and posterior peritoneum closed with interrupted 0 Monocryl.  Cuff was closed vertically with figure-of-eights of 0 Monocryl. Foley catheter was placed.  Reinspection abdominally revealed complete hemostasis.  Irrigating solution from the [place] was used.  All the irrigating solution was removed.  The instruments were removed form the abdomen and gas was allowed to escape.  The skin incisions were closed with interrupted subcuticular sutures of 3-0 Dexon.  Steri-Strips were applied to the two 5-mm incisions.  Skin lesion on the patients skin at the supraclavicular area on the left was then excised sharply with a scalpel.  The defect was closed with a Steri-Strip.  The patient tolerated the operative procedure well and was taken to the recovery room in good condition.  Estimated blood loss was ______  cc. DD:  12/15/99 TD:  12/15/99 Job: [id number]

## 2010-05-28 NOTE — H&P (Signed)
[facility]  Patient:    Beth Holden, Beth Holden                       MRN: [id number] Adm. Date:  12/15/99 Attending:  Freddy Finner, M.D.                         History and Physical  ADMITTING DIAGNOSIS:            Persistent dysfunctional uterine bleeding, failure to respond to cyclical hormonal therapy and recent hysteroscopy dilatation and curettage done in July 2001, and slight uterine enlargement.  HISTORY OF PRESENT ILLNESS:     The patient is a 65 year old white married female, gravida 2, para 2, who has a history of recurrent dysfunctional uterine bleeding and in July 2001 had hysteroscopy/D&C because of thickened endometrium with benign intraoperative findings.  She has been treated with cyclic hormonal therapy and has significant emotional distress with progestins and persistent dysfunctional uterine bleeding in spite of the progestin.  She has requested definitive surgical intervention.  She is admitted at this time for laparoscopically-assisted vaginal hysterectomy, bilateral salpingo-oophorectomy and possible appendectomy.  PAST MEDICAL HISTORY:           The patient has migraine headaches.  She has reflux esophagitis.  There are no other significant medical illnesses.  PAST SURGICAL HISTORY:          Previous surgical procedures include the hysteroscopy/D&C noted above.  She had bilateral tubal ligation in 1978. She has had two vaginal births.  There is no other operative history.  ALLERGIES:                      CODEINE AND DARVON.  MEDICATIONS:                    She is currently on Xanax 0.25 mg as a p.r.n. and Premarin 0.9 mg daily,.  FAMILY HISTORY:                 Noncontributory.  PHYSICAL EXAMINATION:  VITAL SIGNS:                    Blood pressure 130/90.  HEENT:                          Grossly within normal limits.  NECK:                           The thyroid gland is not palpably enlarged to my examination.  CHEST:                           Clear to auscultation.  BREASTS:                        Exam is normal.  HEART:                          Normal sinus rhythm without murmurs, rubs or gallops.  ABDOMEN:                        Soft.  The patient is moderately obese but there is no appreciable organomegaly or palpable mass.  EXTREMITIES:  Without cyanosis, clubbing or edema.  PELVIC:                         External genitalia and cervix are normal.  Per inspection, the uterus is slightly enlarged.  On bimanual exam, there are no palpable adnexal masses.  The rectum and rectovaginal exams are considered to be normal and the rectum is palpably normal.  ASSESSMENT:                     Dysfunctional uterine bleeding unresponsive to cyclic hormonal therapy and intolerance of progestin supplementation as part of hormonal replacement therapy.  PLAN:                           Laparoscopically-assisted vaginal hysterectomy, bilateral salpingo-oophorectomy. DD:  12/14/99 TD:  12/14/99 Job: [id number]

## 2010-05-28 NOTE — Assessment & Plan Note (Signed)
[facility]                           GASTROENTEROLOGY OFFICE NOTE   HARMANI, NETO                     MRN:          [id number]  DATE:08/04/2005                            DOB:          1945-01-14    HISTORY OF PRESENT ILLNESS:  Ms. Kimble returns for followup of GERD. She  notes intermittent mild epigastric and left upper quadrant pain that is not  clearly related to her substernal discomfort that follows meals. Her  symptoms are intermittent. They are not associated with her variable bowel  habits. There appears to be no clear relationship to meals or movement. She  notes no dysphagia, odynophagia, weight loss, nausea, vomiting, melena, or  hematochezia. Her last upper endoscopy was in December of 1998 and revealed  distal erosive esophagitis. She takes Nexium on a daily basis with good  control of her symptoms. She occasionally has breakthrough symptoms and she  doubles up on her Nexium these days. The last colonoscopy was in November  2004 with a recall due in November 2009.   CURRENT MEDICATIONS:  Listed on the chart and have been reviewed.   ALLERGIES:  NO KNOWN DRUG ALLERGIES.   PHYSICAL EXAMINATION:  GENERAL:  No acute distress.  VITAL SIGNS:  Weight 202.2 pounds. Blood pressure 122/70, pulse 84 and  regular.  HEENT:  Anicteric sclerae. Oropharynx clear.  CHEST:  Clear to auscultation bilaterally.  CARDIAC:  Regular rate and rhythm without murmurs.  ABDOMEN:  Soft with very minimal epigastric tenderness to deep palpation. No  rebound or guarding. No palpable organomegaly, masses, or hernias.   ASSESSMENT/PLAN:  1.  Gastroesophageal reflux disease with a history of erosive esophagitis.      Continue Nexium 40 mg p.o. q. A.m. May use Tums or Rolaids for      breakthrough symptoms.  2.  Intermittent epigastric pain. Etiology unclear. Rule out acid or reflux      related symptoms. She is to try Tums and a second dose of Nexium  when      these symptoms are active. If this is not effective for controlling      these symptoms, will proceed with upper endoscopy for further      evaluation.  3.  Personal history of adenomatous colon polyps. Recall colonoscopy      November 2009.  4.  History of irritable bowel syndrome. Symptoms only mild at this point      but she does have      variable bowel habits with occasional constipation and diarrhea. Long      term high fiber diet with adequate fluid intake. Consider re-starting      anti-spasmodic's if she symptoms warrant.                                  Venita Lick. Pleas Koch., MD, Clementeen Graham  MTS/Med[facility]  DD:  08/04/2005  DT:  08/04/2005  Job #:  [id number]   cc:   Rosalyn Gess. Norins, MD

## 2010-05-28 NOTE — Op Note (Signed)
NAMECELITA, ARON NO.:  [id number]   MEDICAL RECORD NO.:  [id number]          PATIENT TYPE:  INP   LOCATION:  4151                         FACILITY:  [facility]   PHYSICIAN:  Claude Manges. Whitfield, M.D.DATE OF BIRTH:  12/18/1945   DATE OF PROCEDURE:  11/13/2003  DATE OF DISCHARGE:                                 OPERATIVE REPORT   PREOPERATIVE DIAGNOSIS:  Impingement right shoulder with rotator cuff tear.   POSTOPERATIVE DIAGNOSIS:  Impingement right shoulder with rotator cuff tear.   PROCEDURE:  1.  Diagnostic arthroscopy, right shoulder, with debridement of partial      anterior glenoid labral tear.  2.  Arthroscopic subacromial decompression.  3.  Mini-open rotator cuff tear repair.   SURGEON:  Claude Manges. Cleophas Dunker, M.D.   ASSISTANT:  Legrand Pitts. Duffy, P.A.-C.   ANESTHESIA:  General orotracheal with supplemental interscalene nerve block.   COMPLICATIONS:  None.   PROCEDURE:  With the patient comfortable on the operating table and under  general orotracheal anesthesia with a supplemental interscalene nerve block,  the patient was placed in a semi-sitting position with the shoulder frame.  The right shoulder was prepped from the base of the neck circumferentially  to below the elbow with DuraPrep.  Sterile draping was performed.  A marking  pen was used to outline the acromion, the [facility] joint, and the coracoid.  At a  point a fingerbreadth inferior and medial to the posterior angle of the  acromion, a small stab wound was made.  The arthroscope was easily placed  into the shoulder joint.  Diagnostic arthroscopy revealed no evidence of  loose material.  There was mild chondromalacia of the glenoid more  anteriorly.  I did not see any fraying of articular cartilage loss of the  humeral head.  There was a partial fraying of the anterior glenoid labrum  and this was shaved with the intra-articular shaver through a second portal  established anteriorly.  The biceps  tendon appeared to be intact.  There was  an obvious large rotator cuff tear, with retraction, I could visualize the  subacromial space easily through the shoulder joint.   The arthroscope was then placed in the subacromial space posteriorly and the  cannula placed in the subacromial space anteriorly.  A third portal was  established in the lateral subacromial space.  An arthroscopic subacromial  decompression was performed with the ArthroCare Wand and the 4 mm hooded bur  and had a very nice decompression.  The edges of the rotator cuff were  identified and rounded with obvious retraction.   A mini-open repair of the rotator cuff was then performed.  About a 1 1/2  inch incision was made beginning at the anterolateral aspect of the acromion  extending inferiorly.  By sharp dissection, the incision was carried down to  the subcutaneous tissue.  A self-retaining retractor was inserted.  The  Bovie was used to incise the deltoid fascia and with blunt dissection, the  fibers were elevated.  The self-retaining retractor was placed more deeply.  There was an obvious rotator cuff tear.  It was  involving both supra and  infraspinatus, probably 2 inches in length from just medial to the biceps  tendon extending anteriorly and then posteriorly.  The edges were rounded  and sharply debrided.  I used three Mitek anchors and a series of 0 Ethibond  suture to repair the cuff and had a very nice repair and with the arm in  neutral, there was no retraction.  The entire humeral head was covered.  There was considerable atrophy and bursal material which was excised.   I checked to be sure I had an adequate decompression anteriorly.  The wound  was irrigated with saline solution.  The deltoid fascia was closed with a  running 0 Vicryl, the subcu with 2-0 Vicryl, and the skin with skin clips.  A sterile bulky dressing was applied followed by an Ace bandage.  The  patient tolerated the procedure well.    PLAN:  23 hour observation, discharge in the a.m. with sling and Percocet  for pain.       PWW/MEDQ  D:  11/13/2003  T:  11/13/2003  Job:  [id number]

## 2010-05-28 NOTE — Op Note (Signed)
[facility]  Patient:    DANICE, DIPPOLITO                       MRN: [id number] Proc. Date: 07/19/99 Attending:  Freddy Finner, M.D.                           Operative Report  PREOPERATIVE DIAGNOSES:       1. Dysfunctional uterine bleeding.                               2. Endometrial thickening and probable                                  endometrial polyp by pelvic ultrasound.  POSTOPERATIVE DIAGNOSES:      1. Dysfunctional uterine bleeding.                               2. Endometrial thickening and probable                                  endometrial polyp by pelvic ultrasound.                               3. Finding of moderate sized endometrial polyp                                  and thickening of the endometrium.  OPERATION:                    Hysteroscopy, D&C, excision of endometrial polyp.  SURGEON:                      Freddy Finner, M.D.  ANESTHESIA:                   Managed intravenous sedation and paracervical block.  The block was placed using 10 cc of 1% plain Xylocaine with injections at four and eight oclock in the vaginal fornices.  ESTIMATED BLOOD LOSS:         20 cc.  Sorbitol deficit, 40 cc.  INTRAOPERATIVE COMPLICATIONS: None.  INDICATIONS:                  Patient is a 65 year old gravida 2, para 2 who has had a previous tubal ligation and has had heavy irregular bleeding and had prolonged dysfunctional bleeding on her Premphase which she is taking for hormone replacement therapy.  Pelvic ultrasound showed endometrial thickening and a 1.6 cm mass effect consistent with a polyp.  She is admitted now for hysteroscopy.  FINDINGS:                     The findings were as noted in postoperative diagnosis.  DESCRIPTION OF PROCEDURE:     Patient was admitted on the morning of surgery and brought to the operating room and placed under adequate intravenous sedation, placed in the dorsal lithotomy position using the  [facility] stirrup system.  Betadine prep of the perineum and  vagina was carried and sterile drapes were applied.  The cervix was visualized using a bivalve speculum.  The cervix was grasped on the anterior lip with a single tooth tenaculum. Paracervical block noted above was placed.  Uterus sounded to 9 cm.  Cervix was progressively dilated to 25 with _________ .  The [facility] 12 degree hysteroscope was introduced using Sorbitol 3% as the distending medium. Inspection revealed findings as noted above.  General thorough curettage _________ forceps adequately sampled the endometrium and removed the polyp. The procedure was at this point terminated.  All instruments were noted. Patient was taken to the recovery room in good condition.  She will be given routine outpatient surgical instructions. DD:  07/19/99 TD:  07/19/99 Job: [id number]

## 2010-05-28 NOTE — H&P (Signed)
NAME:  Beth Holden, Beth Holden              ACCOUNT NO.:  [id number]   MEDICAL RECORD NO.:  [id number]          PATIENT TYPE:  INP   LOCATION:  NA                           FACILITY:  [facility]   PHYSICIAN:  Claude Manges. Whitfield, M.D.DATE OF BIRTH:  12/19/1945   DATE OF ADMISSION:  DATE OF DISCHARGE:                                HISTORY & PHYSICAL   DATE OF ADMISSION:  November 12, 2003   CHIEF COMPLAINT:  Right shoulder pain.   HISTORY OF PRESENT ILLNESS:  Mrs. Lengyel is a pleasant 65 year old white  female with right shoulder pain.  The patient with a right shoulder cuff  tear is in need of surgery repair.  Due to the patient's metabolic syndrome  which is poorly controlled, felt by her primary care physician, Dr. Debby Bud,  that she needs to be admitted for surgery versus outpatient surgery.  The  patient to be admitted by Dr. Debby Bud on November 12, 2003 prior to surgery on  the right shoulder to be performed on November 13, 2003.  The patient with  nearly 68-month history of intermittent right shoulder pain.  However, on  __________ 29, 2005 she fell over a dog gate onto her right shoulder.  Since  that time she has had pain in the shoulder she describes as intermittent  moderate to severe pain that is sharp and throbbing in nature.  The pain is  worse at night and she is unable to sleep on the right shoulder.  She has  decreased range of motion of the shoulder.  MRI of the right shoulder shows  a full-thickness tear of the supraspinatus and infraspinatus tendons.  Both  tendons are retracted approximately 3 cm.   ALLERGIES:  CODEINE and VICODIN cause nausea and vomiting.   MEDICATIONS:  1.  Glyburide 3 mg daily.  2.  Premarin 0.625 mg daily.  3.  Metformin HCl 500 mg daily.  4.  Nexium 40 mg daily.  5.  Tylenol Extra Strength four tablets daily.  6.  Allergy medication unknown medication and dosage.   The patient tolerates morphine narcotics well.   PAST MEDICAL HISTORY:  1.  Metabolic  syndrome, poor control.  2.  GERD.  3.  Seasonal allergies.  4.  Constipation.  5.  History of adenomatous colon polyps.  6.  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  7.  ECG normal sinus rhythm October 28, 2003.   PAST SURGICAL HISTORY:  1.  Tubal ligation.  2.  History of D&C.  3.  Removal of birth mark from left hip.  4.  Hysterectomy approximately 3-and-a-half years ago.   The patient states that she had severe nausea and vomiting after the  hysterectomy.  Otherwise, no complications with the above procedures.   SOCIAL HISTORY:  The patient denies any tobacco or alcohol use.  She is  married, has two grown children.  Primary care physician is Dr. Illene Regulus; phone number is [phone].  The patient lives in a one-story home  with 10 steps to usual entrance.  The patient is a retired Art gallery manager.   FAMILY HISTORY:  The patient's mother deceased due to lung cancer.  Mother  also had diabetes.  Father alive, has hypertension, heart disease.  Both  mother and father had history of arthritis.   REVIEW OF SYSTEMS:  The patient denies any cold, cough, fever, flu-like  symptoms.  She does have chest pain that she relates to her gastric reflux  and stress.  Again, she had normal ECG on October 28, 2003.  She has neck  pain due to her degenerative disc disease, and constipation.  The patient  denies any shortness of breath, PND, or orthopnea.  She wears glasses for  reading only.  Otherwise, review of systems is negative.  She does not have  a Living Will or power of attorney.   PHYSICAL EXAMINATION:  GENERAL:  The patient is a well-developed, well-  nourished female who is in no acute distress.  The patient's mood and affect  are appropriate and she talks easily with the examiner.  Weight 205 pounds,  height 5 feet 8 inches.  VITAL SIGNS:  Temperature 97.5, blood pressure 136/90, respiratory rate is  16, pulse is 80.  CARDIAC:  Regular rate and rhythm.  No murmurs,  rubs, or gallops noted.  LUNGS:  Clear to auscultation bilaterally.  ABDOMEN:  Nontender to palpation.  Abdomen is obese, soft, bowel sounds  heard throughout.  HEENT:  Head is normocephalic, atraumatic, without frontal or maxillary  sinus tenderness to palpation.  Conjunctivae are pink, sclerae are  nonicteric.  PERRLA.  EOMs are intact.  No visual external ear deformities  noted.  Both TMs pearly and gray bilaterally.  Nose:  Nasal septum midline,  nasal mucosa pink and moist without polyps.  Buccal mucosa is pink and  moist.  Pharynx without erythema or exudate.  Tongue and uvula midline.  NECK:  Trachea is midline, no lymphadenopathy.  Carotids are 2+ without  bruits.  She has full range of motion of the neck.  However, rotation of the  neck to the left and right is painful.  Mostly she has tenderness whenever  she rotates the neck to the left and most of her tenderness is located in  and about her trapezius muscle.  She has tenderness to palpation over the  cervical spine at the level of C6-C7.  BACK:  Nontender to palpation over the thoracic and lumbar column.  NEUROLOGIC:  The patient is alert and oriented x3.  Cranial nerves are  grossly intact.  Deep tendon reflexes at the biceps and brachioradialis are  2+ and are equally symmetric bilaterally.  MUSCULOSKELETAL:  Upper extremities are equal in size and shape bilaterally.  Left shoulder:  She has full range of motion without pain.  Right shoulder:  Forward flexion to 85 degrees and flexion beyond this is painful.  Abduction  is 80 degrees, is painful.  Internal rotation is she can reach the level of  L5-S1 with some discomfort.  She has no tenderness over the [facility] joint.  She  does have tenderness over the greater tuberosity and over the coracoid in  the right shoulder.  Otherwise, she has full range of motion of the elbows,  wrists, hands.  Radial pulses are 2+ bilaterally.  MRI of the right shoulder shows full-thickness tear of  the supraspinatus and  infraspinatus tendons.  Both tendons retracted 3 cm.  Acromion has a type 1  shape and shows no significant lateral downsloping.  There is mild  acromioclavicular joint hypertrophy.   IMPRESSION:  1.  Right rotator  cuff tear.  2.  Metabolic syndrome with poor control.  3.  Gastroesophageal reflux disease.  4.  Seasonal allergies.  5.  Constipation.  6.  History of adenomatous colon polyps.  7.  Degenerative disc disease cervical spine.  8.  Normal electrocardiogram October 28, 2003.   PLAN:  The patient to be admitted by Dr. Debby Bud on November 12, 2003 prior to  her surgery on November 13, 2003.  The patient will undergo all preoperative  testing and labs prior to surgery.  The patient will then undergo a right  shoulder arthroscopy with an open rotator cuff repair on November 13, 2003.       GC/MEDQ  D:  11/07/2003  T:  11/07/2003  Job:  [id number]

## 2010-06-10 ENCOUNTER — Telehealth: Payer: Self-pay | Admitting: *Deleted

## 2010-06-10 NOTE — Telephone Encounter (Signed)
Pt was seen in the office on 5/15 for spots on her toe. She says area is now swollen and purple-blue area has increased and there is some discomfort. Do you want to see pt in office again?

## 2010-06-10 NOTE — Telephone Encounter (Signed)
Left mess to call office back.   

## 2010-06-10 NOTE — Telephone Encounter (Signed)
Yes, another ov

## 2010-06-11 ENCOUNTER — Telehealth: Payer: Self-pay | Admitting: *Deleted

## 2010-06-11 NOTE — Telephone Encounter (Signed)
Pt advised via VM, home and mobile

## 2010-06-11 NOTE — Telephone Encounter (Signed)
FYI - see previous phone note, pt has apt with [facility] and she will have info from visit sent to Dr Debby Bud.

## 2010-07-19 ENCOUNTER — Other Ambulatory Visit: Payer: Self-pay | Admitting: Internal Medicine

## 2010-10-13 LAB — GLUCOSE, CAPILLARY
Glucose-Capillary: 129 — ABNORMAL HIGH
Glucose-Capillary: 91

## 2010-11-10 ENCOUNTER — Other Ambulatory Visit: Payer: Self-pay | Admitting: Cardiology

## 2010-11-10 ENCOUNTER — Encounter: Payer: BC Managed Care – PPO | Admitting: Cardiology

## 2010-11-10 DIAGNOSIS — I739 Peripheral vascular disease, unspecified: Secondary | ICD-10-CM

## 2010-11-15 ENCOUNTER — Encounter (INDEPENDENT_AMBULATORY_CARE_PROVIDER_SITE_OTHER): Payer: BC Managed Care – PPO | Admitting: Cardiology

## 2010-11-15 DIAGNOSIS — I739 Peripheral vascular disease, unspecified: Secondary | ICD-10-CM

## 2010-11-15 DIAGNOSIS — E1159 Type 2 diabetes mellitus with other circulatory complications: Secondary | ICD-10-CM

## 2010-11-15 DIAGNOSIS — L97909 Non-pressure chronic ulcer of unspecified part of unspecified lower leg with unspecified severity: Secondary | ICD-10-CM

## 2010-11-15 DIAGNOSIS — L98499 Non-pressure chronic ulcer of skin of other sites with unspecified severity: Secondary | ICD-10-CM

## 2010-11-22 ENCOUNTER — Telehealth: Payer: Self-pay | Admitting: Internal Medicine

## 2010-11-22 NOTE — Telephone Encounter (Signed)
Doppler faxed to [facility] @ [phone]  11/22/10/km

## 2010-11-25 ENCOUNTER — Other Ambulatory Visit: Payer: Self-pay | Admitting: Internal Medicine

## 2010-11-25 DIAGNOSIS — Z1231 Encounter for screening mammogram for malignant neoplasm of breast: Secondary | ICD-10-CM

## 2010-11-28 ENCOUNTER — Encounter: Payer: Self-pay | Admitting: Internal Medicine

## 2010-11-29 ENCOUNTER — Telehealth: Payer: Self-pay

## 2010-11-29 NOTE — Telephone Encounter (Signed)
Patient called [facility] stating that her insurance company is requesting that she has a U/A done be the end of the year. Insurance requires that this is done once a year, but was not done at her last CPX. Please advise on order

## 2010-11-30 NOTE — Telephone Encounter (Signed)
Ok for ua  v70.0

## 2010-12-01 NOTE — Telephone Encounter (Signed)
Spoke with pt she states she is coming in after thanksgiving to have UA done. Order has been put in for pt to come by the lab to have this done

## 2010-12-20 ENCOUNTER — Ambulatory Visit: Payer: BC Managed Care – PPO

## 2010-12-23 ENCOUNTER — Ambulatory Visit
Admission: RE | Admit: 2010-12-23 | Discharge: 2010-12-23 | Disposition: A | Discharge status: Home / Self Care | Payer: BC Managed Care – PPO | Source: Ambulatory Visit | Attending: Internal Medicine | Admitting: Internal Medicine

## 2010-12-23 DIAGNOSIS — Z1231 Encounter for screening mammogram for malignant neoplasm of breast: Secondary | ICD-10-CM

## 2010-12-30 ENCOUNTER — Encounter: Payer: Self-pay | Admitting: Physician Assistant

## 2010-12-30 ENCOUNTER — Other Ambulatory Visit (INDEPENDENT_AMBULATORY_CARE_PROVIDER_SITE_OTHER): Payer: BC Managed Care – PPO

## 2010-12-30 ENCOUNTER — Telehealth: Payer: Self-pay | Admitting: Gastroenterology

## 2010-12-30 ENCOUNTER — Ambulatory Visit (INDEPENDENT_AMBULATORY_CARE_PROVIDER_SITE_OTHER): Payer: BC Managed Care – PPO | Admitting: Physician Assistant

## 2010-12-30 ENCOUNTER — Other Ambulatory Visit: Payer: Self-pay | Admitting: Internal Medicine

## 2010-12-30 VITALS — BP 132/76 | HR 86 | Ht 67.0 in | Wt 190.0 lb

## 2010-12-30 DIAGNOSIS — R1011 Right upper quadrant pain: Secondary | ICD-10-CM

## 2010-12-30 DIAGNOSIS — Z0000 Encounter for general adult medical examination without abnormal findings: Secondary | ICD-10-CM

## 2010-12-30 LAB — URINALYSIS, ROUTINE W REFLEX MICROSCOPIC
Bilirubin Urine: NEGATIVE
Hgb urine dipstick: NEGATIVE
Ketones, ur: NEGATIVE
Leukocytes, UA: NEGATIVE
Nitrite: NEGATIVE
Specific Gravity, Urine: 1.02 (ref 1.000–1.030)
Total Protein, Urine: NEGATIVE
Urine Glucose: >=1000
Urobilinogen, UA: 0.2 (ref 0.0–1.0)
pH: 5.5 (ref 5.0–8.0)

## 2010-12-30 LAB — CBC WITH DIFFERENTIAL/PLATELET
Basophils Absolute: 0 10*3/uL (ref 0.0–0.1)
Basophils Relative: 0.5 % (ref 0.0–3.0)
Eosinophils Absolute: 0.2 10*3/uL (ref 0.0–0.7)
Eosinophils Relative: 1.9 % (ref 0.0–5.0)
HCT: 36.5 % (ref 36.0–46.0)
Hemoglobin: 12.3 g/dL (ref 12.0–15.0)
Lymphocytes Relative: 28.1 % (ref 12.0–46.0)
Lymphs Abs: 2.3 10*3/uL (ref 0.7–4.0)
MCHC: 33.8 g/dL (ref 30.0–36.0)
MCV: 82.1 fl (ref 78.0–100.0)
Monocytes Absolute: 0.5 10*3/uL (ref 0.1–1.0)
Monocytes Relative: 5.7 % (ref 3.0–12.0)
Neutro Abs: 5.2 10*3/uL (ref 1.4–7.7)
Neutrophils Relative %: 63.8 % (ref 43.0–77.0)
Platelets: 395 10*3/uL (ref 150.0–400.0)
RBC: 4.45 Mil/uL (ref 3.87–5.11)
RDW: 13.8 % (ref 11.5–14.6)
WBC: 8.2 10*3/uL (ref 4.5–10.5)

## 2010-12-30 LAB — HEPATIC FUNCTION PANEL
ALT: 17 U/L (ref 0–35)
AST: 25 U/L (ref 0–37)
Albumin: 3.8 g/dL (ref 3.5–5.2)
Alkaline Phosphatase: 51 U/L (ref 39–117)
Bilirubin, Direct: 0.1 mg/dL (ref 0.0–0.3)
Total Bilirubin: 0.5 mg/dL (ref 0.3–1.2)
Total Protein: 7.1 g/dL (ref 6.0–8.3)

## 2010-12-30 LAB — LIPASE: Lipase: 29 U/L (ref 11.0–59.0)

## 2010-12-30 NOTE — Patient Instructions (Signed)
Please go to the basement level to have your labs drawn.   We scheduled the abdominal ultrasound for tomorrow 12-31-2010 at [facility] 1st floor. Friday.  Arrive at 9:15 AM. Go to patient registration 1st floor. Have nothing to eat or drink after midnight.

## 2010-12-30 NOTE — Telephone Encounter (Signed)
Patient with a one week history of RUQ pain.  She states pain is waking her up at night.  She denies nausea and vomiting, fever or other symptoms.  She will come in today and see Amy Esterwood PA at 2:30

## 2010-12-31 ENCOUNTER — Other Ambulatory Visit: Payer: Self-pay | Admitting: *Deleted

## 2010-12-31 ENCOUNTER — Ambulatory Visit (HOSPITAL_COMMUNITY)
Admission: RE | Admit: 2010-12-31 | Discharge: 2010-12-31 | Disposition: A | Discharge status: Home / Self Care | Payer: BC Managed Care – PPO | Source: Ambulatory Visit | Attending: Physician Assistant | Admitting: Physician Assistant

## 2010-12-31 ENCOUNTER — Telehealth: Payer: Self-pay | Admitting: Physician Assistant

## 2010-12-31 ENCOUNTER — Encounter: Payer: Self-pay | Admitting: Physician Assistant

## 2010-12-31 DIAGNOSIS — R1011 Right upper quadrant pain: Secondary | ICD-10-CM | POA: Insufficient documentation

## 2010-12-31 MED ORDER — TRAMADOL HCL 50 MG PO TABS: ORAL_TABLET | ORAL | Status: DC

## 2010-12-31 NOTE — Telephone Encounter (Signed)
Talked to patient and her pain is increasing and wanted results of [place].  She asked if it showed anything to do with her kidneys since the RUQ pain is radiating to her back also. I told her I am going to talk to [facility] PA early this afternoon and we will get back to her.

## 2010-12-31 NOTE — Progress Notes (Signed)
Subjective:    Patient ID: Beth Holden, female    DOB: 06/04/1945, 65 y.o.   MRN: [id number]  HPI Beth Holden is a 65 year old female known to Dr. Russella Dar. She had colonoscopy in 2009 which was normal . She has history of diabetes peripheral neuropathy, GERD, and IBS. She comes in today with complaint of new onset of right upper quadrant  pain over the past week. She describes a pain which is constant and sharp very uncomfortable onset about 5 AM this morning- says radiates straight through into her back. Over the past week she has been having intermittent similar but much less intense pain lasting about 30 minutes and then using off. She has not had any fever or chills. She does describe some vague nausea but has been eating and does not feel that she's any worse after by mouth intake. She does have chronic problems with constipation but does not feel she's had any changes in her bowel habits over this past week and no melena or hematochezia. She is not on any regular aspirin or NSAIDs. She has not started any new medications. She has not had any injuries or strains that she is aware of. Her only prior abdominal surgery was a bilateral tubal ligation. She is maintained chronically on Nexium 40 mg twice daily for her GERD.  Review of Systems  Constitutional: Negative.   HENT: Negative.   Eyes: Negative.   Respiratory: Negative.   Cardiovascular: Negative.   Gastrointestinal: Positive for nausea and abdominal pain.  Genitourinary: Negative.   Musculoskeletal: Negative.   Neurological: Negative.   Psychiatric/Behavioral: Negative.    Outpatient Prescriptions Prior to Visit  Medication Sig Dispense Refill  . acetaminophen (TYLENOL) 500 MG tablet Take 500 mg by mouth as needed.        Marland Kitchen amitriptyline (ELAVIL) 25 MG tablet Take 25 mg by mouth at bedtime.        . cetirizine (ZYRTEC) 10 MG tablet Take 10 mg by mouth daily.        . CRESTOR 5 MG tablet TAKE 1 TABLET AT BEDTIME  90 tablet  2  .  enalapril (VASOTEC) 20 MG tablet Take 20 mg by mouth daily.        Marland Kitchen esomeprazole (NEXIUM) 40 MG capsule Take 40 mg by mouth 2 (two) times daily.        . Fluocinolone Acetonide (DERMOTIC) 0.01 % OIL Place in ear(s) as directed.       . gabapentin (NEURONTIN) 300 MG capsule TAKE 1 CAPSULE AT BEDTIME  90 capsule  2  . glucose blood test strip Twice daily DX:250.01  180 each  3  . insulin detemir (LEVEMIR FLEXPEN) 100 UNIT/ML injection Inject into the skin at bedtime.        . Insulin Pen Needle (PEN NEEDLES 31GX5/16") 31G X 8 MM MISC 1 cartridge by Does not apply route 2 (two) times daily. 90 day supply  180 each  3  . metFORMIN (GLUMETZA) 1000 MG (MOD) 24 hr tablet Take 1,000 mg by mouth 2 (two) times daily.         Allergies  Allergen Reactions  . Codeine   . Propoxyphene Hcl        Objective:   Physical Exam well-developed white female in no acute distress, pleasant, blood pressure 132/76, pulse 86. HEENT nontraumatic normocephalic EOMI PERRLA sclera anicteric, Supple no JVD, Cardiovascular; regular rate and rhythm with S1-S2 no murmur gallop, Pulmonary; clear bilaterally, Abdomen; large soft she is tender in  the right upper quadrant there is no guarding no rebound no palpable mass or hepatosplenomegaly, Rectal; not done, ; Extremities; no clubbing cyanosis or edema,  Psych; mood and affect normal and appropriate   Impression/Plan; Will schedule for upper abdominal US CBC,CMET,Lipase today Continue twice daily Nexium Offered an analgesic which she declines at present If [place] is negative will proceed with cck hida scan

## 2010-12-31 NOTE — Telephone Encounter (Signed)
I called the patient and let her know I scheduled the [facility] Hidascan for 01-13-2011 at 10 AM at [facility].  She is to arrive at 9:45AM and have no pain medication for 6 hours prior to the test.  Have nothing by mouth 6 hours prior to the test.  Also we sent prescription for Ultram 50 mg to [facility] and she asked me to [facility] since she is going there for a Christmas gathering with her family tonight.  I did advise her to pick it up as soon as she can and take it every 6 hours as needed for pain.  I also told her if she develops fever or severe pain this weekend to go to the emergency room.  I also asked her to call me on Monday 01-03-11 with a progress report.

## 2011-01-01 NOTE — Progress Notes (Signed)
I agree with ordering abd. Ultrasound to r/o symptomatic gall bladder disease. Her LFT's are normal, WBC normal as well.

## 2011-01-02 ENCOUNTER — Encounter: Payer: Self-pay | Admitting: Internal Medicine

## 2011-01-03 ENCOUNTER — Telehealth: Payer: Self-pay | Admitting: Physician Assistant

## 2011-01-03 ENCOUNTER — Other Ambulatory Visit: Payer: Self-pay | Admitting: *Deleted

## 2011-01-03 DIAGNOSIS — R1013 Epigastric pain: Secondary | ICD-10-CM

## 2011-01-03 MED ORDER — TRAMADOL HCL 50 MG PO TABS: ORAL_TABLET | ORAL | Status: DC

## 2011-01-03 NOTE — Progress Notes (Signed)
Addended byDerry Skill on: 01/03/2011 08:19 AM   Modules accepted: Orders

## 2011-01-03 NOTE — Telephone Encounter (Signed)
Per Mike Gip PA, we are ordering BMET and the CT scan Abd and Pelvis w/ contrast.  Informed the patient to come to our building at 10 AM on Wedc 01-05-2011 and go to the lab basement level.  Then she can come to our 3rd floor reception desk and pick up the 2 bottle of contrast. She is to drink the 1st bottle at 11:30 AM and the 2nd bottle at 12:30 PM.   She should arrive at [address] building  At 1:15 PM.  The patient thanked me for calling her back about the Tramadol instructions and for the CT scan.

## 2011-01-03 NOTE — Telephone Encounter (Signed)
The patient called to say she is taking the Tramadol for the upper abdominal pain and it is only lasting for about 3 hours.  Amy Esterwood PA said that the patient can take the Tramadol 1 tab every 6 hours.  She said she was up at 3:00AM  walking the floor due to the pain.  I reminded her she can call the MD on call if she has questions.  I sent the Tramadol on Friday to [facility] and she said they didn't have the prescription .  The patient called our MD on call and Dr. [name] answered the call and called the prescription in to [facility].  I apologized for her having to do that.  She is trying to avoid going to the hospital.  We got her Hidascan appt for 01-13-2011 at [facility].  That is the soonest I could get it.  The patient asked if she could take the Tramadol any sooner that 6 hours.?

## 2011-01-03 NOTE — Telephone Encounter (Signed)
I took this out of Dr. Teresita Madura inbox due to Mike Gip PA being in her office today and I was able to speak to her about the patient.  Amy is going to look at the [place] results again and will readdress the patients abdominal pain situation.  Amy did say the patient can take the Tramadol every 4-6 hours if needed for pain.

## 2011-01-05 ENCOUNTER — Other Ambulatory Visit (INDEPENDENT_AMBULATORY_CARE_PROVIDER_SITE_OTHER): Payer: BC Managed Care – PPO

## 2011-01-05 ENCOUNTER — Observation Stay (HOSPITAL_COMMUNITY)
Admission: EM | Admit: 2011-01-05 | Discharge: 2011-01-06 | DRG: 883 | Disposition: A | Discharge status: Home / Self Care | Payer: BC Managed Care – PPO | Attending: General Surgery | Admitting: General Surgery

## 2011-01-05 ENCOUNTER — Encounter (HOSPITAL_COMMUNITY)
Admission: EM | Disposition: A | Discharge status: Home / Self Care | Payer: Self-pay | Source: Home / Self Care | Attending: Emergency Medicine

## 2011-01-05 ENCOUNTER — Encounter (HOSPITAL_COMMUNITY): Payer: Self-pay | Admitting: Emergency Medicine

## 2011-01-05 ENCOUNTER — Other Ambulatory Visit: Payer: Self-pay

## 2011-01-05 ENCOUNTER — Telehealth (INDEPENDENT_AMBULATORY_CARE_PROVIDER_SITE_OTHER): Payer: Self-pay

## 2011-01-05 ENCOUNTER — Encounter (HOSPITAL_COMMUNITY): Payer: Self-pay | Admitting: *Deleted

## 2011-01-05 ENCOUNTER — Emergency Department (HOSPITAL_COMMUNITY): Payer: BC Managed Care – PPO

## 2011-01-05 ENCOUNTER — Emergency Department (HOSPITAL_COMMUNITY): Payer: BC Managed Care – PPO | Admitting: *Deleted

## 2011-01-05 ENCOUNTER — Ambulatory Visit (INDEPENDENT_AMBULATORY_CARE_PROVIDER_SITE_OTHER)
Admission: RE | Admit: 2011-01-05 | Discharge: 2011-01-05 | Disposition: A | Discharge status: Home / Self Care | Payer: BC Managed Care – PPO | Source: Ambulatory Visit | Attending: Physician Assistant | Admitting: Physician Assistant

## 2011-01-05 ENCOUNTER — Other Ambulatory Visit (INDEPENDENT_AMBULATORY_CARE_PROVIDER_SITE_OTHER): Payer: Self-pay | Admitting: Surgery

## 2011-01-05 DIAGNOSIS — R109 Unspecified abdominal pain: Secondary | ICD-10-CM

## 2011-01-05 DIAGNOSIS — E785 Hyperlipidemia, unspecified: Secondary | ICD-10-CM | POA: Diagnosis present

## 2011-01-05 DIAGNOSIS — K3580 Unspecified acute appendicitis: Principal | ICD-10-CM | POA: Diagnosis present

## 2011-01-05 DIAGNOSIS — K219 Gastro-esophageal reflux disease without esophagitis: Secondary | ICD-10-CM | POA: Diagnosis present

## 2011-01-05 DIAGNOSIS — I10 Essential (primary) hypertension: Secondary | ICD-10-CM | POA: Diagnosis present

## 2011-01-05 DIAGNOSIS — E1142 Type 2 diabetes mellitus with diabetic polyneuropathy: Secondary | ICD-10-CM | POA: Diagnosis present

## 2011-01-05 DIAGNOSIS — R1013 Epigastric pain: Secondary | ICD-10-CM

## 2011-01-05 DIAGNOSIS — E1149 Type 2 diabetes mellitus with other diabetic neurological complication: Secondary | ICD-10-CM | POA: Diagnosis present

## 2011-01-05 HISTORY — PX: LAPAROSCOPIC APPENDECTOMY: SHX408

## 2011-01-05 LAB — COMPREHENSIVE METABOLIC PANEL
ALT: 18 U/L (ref 0–35)
AST: 29 U/L (ref 0–37)
Albumin: 3.7 g/dL (ref 3.5–5.2)
Alkaline Phosphatase: 62 U/L (ref 39–117)
BUN: 13 mg/dL (ref 6–23)
CO2: 24 mEq/L (ref 19–32)
Calcium: 9.5 mg/dL (ref 8.4–10.5)
Chloride: 97 mEq/L (ref 96–112)
Creatinine, Ser: 0.6 mg/dL (ref 0.50–1.10)
GFR calc Af Amer: >90 mL/min (ref >90)
GFR calc non Af Amer: >90 mL/min (ref >90)
Glucose, Bld: 316 mg/dL — ABNORMAL HIGH (ref 70–99)
Potassium: 3.9 mEq/L (ref 3.5–5.1)
Sodium: 132 mEq/L — ABNORMAL LOW (ref 135–145)
Total Bilirubin: 0.4 mg/dL (ref 0.3–1.2)
Total Protein: 7.3 g/dL (ref 6.0–8.3)

## 2011-01-05 LAB — CBC
HCT: 36.3 % (ref 36.0–46.0)
Hemoglobin: 12.1 g/dL (ref 12.0–15.0)
MCH: 26.8 pg (ref 26.0–34.0)
MCHC: 33.3 g/dL (ref 30.0–36.0)
MCV: 80.3 fL (ref 78.0–100.0)
Platelets: 383 10*3/uL (ref 150–400)
RBC: 4.52 MIL/uL (ref 3.87–5.11)
RDW: 13 % (ref 11.5–15.5)
WBC: 10.8 10*3/uL — ABNORMAL HIGH (ref 4.0–10.5)

## 2011-01-05 LAB — BASIC METABOLIC PANEL
BUN: 16 mg/dL (ref 6–23)
CO2: 29 mEq/L (ref 19–32)
Calcium: 9.2 mg/dL (ref 8.4–10.5)
Chloride: 102 mEq/L (ref 96–112)
Creatinine, Ser: 0.5 mg/dL (ref 0.4–1.2)
GFR: 126.48 mL/min (ref >60.00)
Glucose, Bld: 251 mg/dL — ABNORMAL HIGH (ref 70–99)
Potassium: 4.1 mEq/L (ref 3.5–5.1)
Sodium: 138 mEq/L (ref 135–145)

## 2011-01-05 LAB — GLUCOSE, CAPILLARY: Glucose-Capillary: 236 mg/dL — ABNORMAL HIGH (ref 70–99)

## 2011-01-05 LAB — PROTIME-INR
INR: 1.01 (ref 0.00–1.49)
Prothrombin Time: 13.5 seconds (ref 11.6–15.2)

## 2011-01-05 SURGERY — APPENDECTOMY, LAPAROSCOPIC
Anesthesia: General | Site: Abdomen | Wound class: Contaminated

## 2011-01-05 MED ORDER — LACTATED RINGERS IV SOLN
INTRAVENOUS | Status: DC | PRN
  Administered 2011-01-05 (×2): via INTRAVENOUS

## 2011-01-05 MED ORDER — BUPIVACAINE HCL 0.5 % IJ SOLN
INTRAMUSCULAR | Status: DC | PRN
  Administered 2011-01-05: 20 mL

## 2011-01-05 MED ORDER — PROMETHAZINE HCL 25 MG/ML IJ SOLN: 6.2500 mg | INTRAMUSCULAR | Status: DC | PRN

## 2011-01-05 MED ORDER — MORPHINE SULFATE 2 MG/ML IJ SOLN: 0.5000 mg | INTRAMUSCULAR | Status: DC | PRN

## 2011-01-05 MED ORDER — HEPARIN SODIUM (PORCINE) 5000 UNIT/ML IJ SOLN
5000.0000 [IU] | Freq: Three times a day (TID) | INTRAMUSCULAR | Status: DC
  Administered 2011-01-06: 5000 [IU] via SUBCUTANEOUS
  Filled 2011-01-05 (×5): qty 1

## 2011-01-05 MED ORDER — CISATRACURIUM BESYLATE 2 MG/ML IV SOLN
INTRAVENOUS | Status: DC | PRN
  Administered 2011-01-05: 2 mg via INTRAVENOUS
  Administered 2011-01-05: 6 mg via INTRAVENOUS

## 2011-01-05 MED ORDER — OXYCODONE-ACETAMINOPHEN 5-325 MG PO TABS
1.0000 | ORAL_TABLET | ORAL | Status: DC | PRN
  Administered 2011-01-06: 1 via ORAL
  Filled 2011-01-05: qty 2

## 2011-01-05 MED ORDER — BUPIVACAINE-EPINEPHRINE PF 0.25-1:200000 % IJ SOLN
INTRAMUSCULAR | Status: AC
  Filled 2011-01-05: qty 30

## 2011-01-05 MED ORDER — ACETAMINOPHEN 10 MG/ML IV SOLN
INTRAVENOUS | Status: DC | PRN
  Administered 2011-01-05: 1000 mg via INTRAVENOUS

## 2011-01-05 MED ORDER — GLYCOPYRROLATE 0.2 MG/ML IJ SOLN
INTRAMUSCULAR | Status: DC | PRN
  Administered 2011-01-05: .3 mg via INTRAVENOUS

## 2011-01-05 MED ORDER — FENTANYL CITRATE 0.05 MG/ML IJ SOLN
INTRAMUSCULAR | Status: DC | PRN
  Administered 2011-01-05 (×5): 50 ug via INTRAVENOUS

## 2011-01-05 MED ORDER — ONDANSETRON HCL 4 MG/2ML IJ SOLN
INTRAMUSCULAR | Status: DC | PRN
  Administered 2011-01-05 (×2): 2 mg via INTRAVENOUS

## 2011-01-05 MED ORDER — CEFOXITIN SODIUM 2 G IV SOLR
2.0000 g | INTRAVENOUS | Status: AC
  Administered 2011-01-05: 2 g via INTRAVENOUS
  Filled 2011-01-05: qty 2

## 2011-01-05 MED ORDER — IOHEXOL 300 MG/ML  SOLN
100.0000 mL | Freq: Once | INTRAMUSCULAR | Status: AC | PRN
  Administered 2011-01-05: 100 mL via INTRAVENOUS

## 2011-01-05 MED ORDER — BUPIVACAINE HCL 0.5 % IJ SOLN
INTRAMUSCULAR | Status: AC
  Filled 2011-01-05: qty 1

## 2011-01-05 MED ORDER — EPHEDRINE SULFATE 50 MG/ML IJ SOLN
INTRAMUSCULAR | Status: DC | PRN
  Administered 2011-01-05 (×2): 5 mg via INTRAVENOUS

## 2011-01-05 MED ORDER — ACETAMINOPHEN 10 MG/ML IV SOLN
INTRAVENOUS | Status: AC
  Filled 2011-01-05: qty 100

## 2011-01-05 MED ORDER — LACTATED RINGERS IR SOLN
Status: DC | PRN
  Administered 2011-01-05: 1000 mL

## 2011-01-05 MED ORDER — DEXTROSE 5 % IV SOLN
1.0000 g | INTRAVENOUS | Status: DC | PRN
  Administered 2011-01-05: 2 g via INTRAVENOUS

## 2011-01-05 MED ORDER — MEPERIDINE HCL 25 MG/ML IJ SOLN: 6.2500 mg | INTRAMUSCULAR | Status: DC | PRN

## 2011-01-05 MED ORDER — FENTANYL CITRATE 0.05 MG/ML IJ SOLN: 25.0000 ug | INTRAMUSCULAR | Status: DC | PRN

## 2011-01-05 MED ORDER — PROPOFOL 10 MG/ML IV EMUL
INTRAVENOUS | Status: DC | PRN
  Administered 2011-01-05: 170 mg via INTRAVENOUS

## 2011-01-05 MED ORDER — INSULIN ASPART 100 UNIT/ML ~~LOC~~ SOLN
0.0000 [IU] | SUBCUTANEOUS | Status: DC
  Administered 2011-01-05: 7 [IU] via SUBCUTANEOUS
  Administered 2011-01-06: 11 [IU] via SUBCUTANEOUS
  Administered 2011-01-06: 20 [IU] via SUBCUTANEOUS
  Filled 2011-01-05: qty 3

## 2011-01-05 MED ORDER — 0.9 % SODIUM CHLORIDE (POUR BTL) OPTIME
TOPICAL | Status: DC | PRN
  Administered 2011-01-05: 1000 mL

## 2011-01-05 MED ORDER — SODIUM CHLORIDE 0.9 % IV SOLN
Freq: Once | INTRAVENOUS | Status: AC
  Administered 2011-01-05: 18:00:00 via INTRAVENOUS

## 2011-01-05 MED ORDER — METOCLOPRAMIDE HCL 5 MG/ML IJ SOLN
INTRAMUSCULAR | Status: DC | PRN
Start: 2011-01-05 — End: 2011-01-05
  Administered 2011-01-05: 5 mg via INTRAVENOUS

## 2011-01-05 MED ORDER — LACTATED RINGERS IV SOLN: INTRAVENOUS | Status: DC

## 2011-01-05 MED ORDER — NEOSTIGMINE METHYLSULFATE 1 MG/ML IJ SOLN
INTRAMUSCULAR | Status: DC | PRN
  Administered 2011-01-05: 2 mg via INTRAVENOUS

## 2011-01-05 MED ORDER — ONDANSETRON HCL 4 MG/2ML IJ SOLN
INTRAMUSCULAR | Status: AC
  Filled 2011-01-05: qty 2

## 2011-01-05 MED ORDER — MIDAZOLAM HCL 5 MG/5ML IJ SOLN
INTRAMUSCULAR | Status: DC | PRN
  Administered 2011-01-05 (×2): 1 mg via INTRAVENOUS

## 2011-01-05 MED ORDER — POTASSIUM CHLORIDE IN NACL 20-0.45 MEQ/L-% IV SOLN
INTRAVENOUS | Status: DC
  Administered 2011-01-05 – 2011-01-06 (×2): via INTRAVENOUS
  Filled 2011-01-05 (×4): qty 1000

## 2011-01-05 MED ORDER — INSULIN GLARGINE 100 UNIT/ML ~~LOC~~ SOLN
5.0000 [IU] | Freq: Every day | SUBCUTANEOUS | Status: DC
  Filled 2011-01-05: qty 3

## 2011-01-05 MED ORDER — DEXAMETHASONE SODIUM PHOSPHATE 10 MG/ML IJ SOLN
INTRAMUSCULAR | Status: DC | PRN
  Administered 2011-01-05: 10 mg via INTRAVENOUS

## 2011-01-05 MED ORDER — SUCCINYLCHOLINE CHLORIDE 20 MG/ML IJ SOLN
INTRAMUSCULAR | Status: DC | PRN
  Administered 2011-01-05: 100 mg via INTRAVENOUS

## 2011-01-05 MED ORDER — LIDOCAINE HCL (CARDIAC) 20 MG/ML IV SOLN
INTRAVENOUS | Status: DC | PRN
  Administered 2011-01-05: 80 mg via INTRAVENOUS

## 2011-01-05 SURGICAL SUPPLY — 43 items
APPLIER CLIP ROT 10 11.4 M/L (STAPLE)
BENZOIN TINCTURE PRP APPL 2/3 (GAUZE/BANDAGES/DRESSINGS) IMPLANT
CANISTER SUCTION 2500CC (MISCELLANEOUS) ×2 IMPLANT
CLIP APPLIE ROT 10 11.4 M/L (STAPLE) IMPLANT
CLOTH BEACON ORANGE TIMEOUT ST (SAFETY) ×2 IMPLANT
COVER SURGICAL LIGHT HANDLE (MISCELLANEOUS) ×2 IMPLANT
CUTTER FLEX LINEAR 45M (STAPLE) ×2 IMPLANT
DECANTER SPIKE VIAL GLASS SM (MISCELLANEOUS) ×2 IMPLANT
DRAPE LAPAROSCOPIC ABDOMINAL (DRAPES) ×2 IMPLANT
ELECT REM PT RETURN 9FT ADLT (ELECTROSURGICAL) ×2
ELECTRODE REM PT RTRN 9FT ADLT (ELECTROSURGICAL) ×1 IMPLANT
ENDOLOOP SUT PDS II  0 18 (SUTURE)
ENDOLOOP SUT PDS II 0 18 (SUTURE) IMPLANT
GAUZE SPONGE 2X2 8PLY STRL LF (GAUZE/BANDAGES/DRESSINGS) ×2 IMPLANT
GLOVE BIOGEL M 8.0 STRL (GLOVE) ×2 IMPLANT
GLOVE BIOGEL PI IND STRL 7.0 (GLOVE) IMPLANT
GLOVE BIOGEL PI INDICATOR 7.0 (GLOVE)
GLOVE SURG SS PI 8.0 STRL IVOR (GLOVE) ×2 IMPLANT
GLOVE SURG SS PI 8.5 STRL IVOR (GLOVE) ×1
GLOVE SURG SS PI 8.5 STRL STRW (GLOVE) ×1 IMPLANT
GOWN STRL NON-REIN LRG LVL3 (GOWN DISPOSABLE) IMPLANT
GOWN STRL REIN XL XLG (GOWN DISPOSABLE) ×4 IMPLANT
HAND ACTIVATED (MISCELLANEOUS) ×2 IMPLANT
IV LACTATED RINGERS 1000ML (IV SOLUTION) ×2 IMPLANT
KIT BASIN OR (CUSTOM PROCEDURE TRAY) ×2 IMPLANT
NS IRRIG 1000ML POUR BTL (IV SOLUTION) ×2 IMPLANT
PENCIL BUTTON HOLSTER BLD 10FT (ELECTRODE) IMPLANT
POUCH SPECIMEN RETRIEVAL 10MM (ENDOMECHANICALS) ×4 IMPLANT
RELOAD 45 VASCULAR/THIN (ENDOMECHANICALS) ×4 IMPLANT
RELOAD STAPLE TA45 3.5 REG BLU (ENDOMECHANICALS) IMPLANT
SET IRRIG TUBING LAPAROSCOPIC (IRRIGATION / IRRIGATOR) ×2 IMPLANT
SOLUTION ANTI FOG 6CC (MISCELLANEOUS) ×2 IMPLANT
SPONGE GAUZE 2X2 STER 10/PKG (GAUZE/BANDAGES/DRESSINGS) ×2
STRIP CLOSURE SKIN 1/2X4 (GAUZE/BANDAGES/DRESSINGS) ×2 IMPLANT
SUT VIC AB 4-0 SH 18 (SUTURE) ×2 IMPLANT
SUT VICRYL 0 UR6 27IN ABS (SUTURE) ×2 IMPLANT
SYR 30ML LL (SYRINGE) IMPLANT
TRAY FOLEY CATH 14FRSI W/METER (CATHETERS) ×2 IMPLANT
TRAY LAP CHOLE (CUSTOM PROCEDURE TRAY) ×2 IMPLANT
TROCAR BLADELESS OPT 5 75 (ENDOMECHANICALS) ×2 IMPLANT
TROCAR XCEL BLUNT TIP 100MML (ENDOMECHANICALS) ×2 IMPLANT
TROCAR XCEL NON-BLD 11X100MML (ENDOMECHANICALS) ×2 IMPLANT
TUBING INSUFFLATION 10FT LAP (TUBING) ×2 IMPLANT

## 2011-01-05 NOTE — Preoperative (Signed)
Beta Blockers   Reason not to administer Beta Blockers:Not Applicable 

## 2011-01-05 NOTE — Op Note (Signed)
Surgeon: Wenda Low, MD, FACS  Asst:  none  Anes:  general  Procedure: Laparoscopic appendectomy  Diagnosis: Slightly edematous appendix, normal gallbladder  Complications: None noted  EBL:   5 cc  Description of Procedure:  The patient was seen initially by Dr.Streck and then by myself. Informed consent was obtained regarding laparoscopic appendectomy.  The patient was taken to or 1 on the evening of Wednesday, 01/05/2011 and given general anesthesia. The abdomen was prepped with PCMX and draped sterilely. Access to the abdomen was achieved a longitudinal incision down to the umbilicus and through relatively small incision line the placement of a Hassan cannula. Insufflation occurred. A survey of the abdomen and found the gallbladder Pretty robins a blue appearance. The appendix appeared slightly edematous and in the midshaft of the appendix there was a bulbous area. I grasped the appendix and began dissecting proximally and were a couple stapler crossed was really more of a little ways down on the ultimately on the shaft. After transecting it the appendix at that level was then able to mobilize the cecum and C2 really get the base better isolated and applied a second stapler white load and removed that. The appendix was then removed in 2 segments. The first small nubbin was put in a bag and brought out. I then went to the mesentery of the the larger portion the remaining running appendix with the harmonic scalpel and then placed it in a bag and brought it out. The appendiceal mesentery and the cecum were not bleeding. Having looked to be in order  No other cause or source for her pain.  Wounds were injected with half percent Marcaine and were closed with 4-0 Vicryl benzoin Steri-Strips.  Matt B. Daphine Deutscher, MD, [facility], [place] [phone]

## 2011-01-05 NOTE — Anesthesia Postprocedure Evaluation (Deleted)
  Anesthesia Post-op Note  [name]  Procedure(s) Performed:  APPENDECTOMY LAPAROSCOPIC  Patient Location: PACU  Anesthesia Type: General  Level of Consciousness: awake and alert   Airway and Oxygen Therapy: Patient Spontanous Breathing  Post-op Pain: mild  Post-op Assessment: Post-op Vital signs reviewed, Patient's Cardiovascular Status Stable, Respiratory Function Stable, Patent Airway and No signs of Nausea or vomiting  Post-op Vital Signs: stable  Complications: No apparent anesthesia complications

## 2011-01-05 NOTE — ED Provider Notes (Signed)
History     CSN: [id number]  Arrival date & time 01/05/11  1501   First MD Initiated Contact with Patient 01/05/11 1503      Chief Complaint  Patient presents with  . Abdominal Pain    (Consider location/radiation/quality/duration/timing/severity/associated sxs/prior treatment) HPI The patient presents with one week of right lower quadrant pain. The pain began gradually. Since onset the pain has been more present than not, crampy, sharp, burning. The pain is minimally radiating to the posterior. No relief with anything, symptoms are unpredictably worse for short spells. No fevers, no chills, no vomiting, mild nausea, no diarrhea. Notably the patient has had an ultrasound and CT evaluation already. She was made aware of abnormal CT results and was referred to the emergency department for further evaluation Past Medical History  Diagnosis Date  . Rhinitis, allergic   . GERD (gastroesophageal reflux disease)   . Erosive esophagitis   . Peripheral neuropathy   . Hypertension   . IBS (irritable bowel syndrome)   . Hyperlipemia   . Diabetes mellitus type II     Past Surgical History  Procedure Date  . Vaginal hysterectomy 2001  . Oophorectomy 2001  . Tubal ligation   . Rotator cuff repair     left    Family History  Problem Relation Age of Onset  . Lung cancer Mother 52  . Hypertension Father   . Hyperlipidemia Father   . Diabetes Father   . Coronary artery disease Father   . Dementia Father   . Stomach cancer Paternal Aunt   . Brain cancer Paternal Uncle   . Colon cancer Neg Hx   . Irritable bowel syndrome      Several family members on fathers side     History  Substance Use Topics  . Smoking status: Never Smoker   . Smokeless tobacco: Never Used  . Alcohol Use: No    OB History    Grav Para Term Preterm Abortions TAB SAB Ect Mult Living                  Review of Systems  Constitutional:       HPI  HENT:       HPI otherwise negative  Eyes:  Negative.   Respiratory:       HPI, otherwise negative  Cardiovascular:       HPI, otherwise nmegative  Gastrointestinal: Negative for vomiting.  Genitourinary:       HPI, otherwise negative  Musculoskeletal:       HPI, otherwise negative  Skin: Negative.   Neurological: Negative for syncope.    Allergies  Codeine and Propoxyphene hcl  Home Medications   Current Outpatient Rx  Name Route Sig Dispense Refill  . ACETAMINOPHEN 500 MG PO TABS Oral Take 500 mg by mouth as needed.      Marland Kitchen AMITRIPTYLINE HCL 25 MG PO TABS Oral Take 25 mg by mouth at bedtime.      Marland Kitchen CETIRIZINE HCL 10 MG PO TABS Oral Take 10 mg by mouth daily.      . CRESTOR 5 MG PO TABS  TAKE 1 TABLET AT BEDTIME 90 tablet 2  . ENALAPRIL MALEATE 20 MG PO TABS Oral Take 20 mg by mouth daily.      Marland Kitchen ESOMEPRAZOLE MAGNESIUM 40 MG PO CPDR Oral Take 40 mg by mouth 2 (two) times daily.      Marland Kitchen FLUOCINOLONE ACETONIDE 0.01 % OT OIL Otic Place in ear(s) as directed.     Marland Kitchen  GABAPENTIN 300 MG PO CAPS  TAKE 1 CAPSULE AT BEDTIME 90 capsule 2  . GLUCOSE BLOOD VI STRP  Twice daily DX:250.01 180 each 3  . INSULIN DETEMIR 100 UNIT/ML [place] SOLN Subcutaneous Inject into the skin at bedtime.      . PEN NEEDLES 5/16" 31G X 8 MM MISC Does not apply 1 cartridge by Does not apply route 2 (two) times daily. 90 day supply 180 each 3  . METFORMIN HCL ER (MOD) 1000 MG PO TB24 Oral Take 1,000 mg by mouth 2 (two) times daily.      . TRAMADOL HCL 50 MG PO TABS  Take 1 tab every 6 hours as needed for pain. 25 tablet 1    There were no vitals taken for this visit.  Physical Exam  Nursing note and vitals reviewed. Constitutional: She is oriented to person, place, and time. She appears well-developed and well-nourished. No distress.  HENT:  Head: Normocephalic and atraumatic.  Eyes: Conjunctivae and EOM are normal.  Cardiovascular: Normal rate and regular rhythm.   Pulmonary/Chest: Effort normal and breath sounds normal. No stridor. No respiratory  distress.  Abdominal: Normal appearance and bowel sounds are normal. She exhibits no distension. There is tenderness. There is tenderness at McBurney's point.  Musculoskeletal: She exhibits no edema.  Neurological: She is alert and oriented to person, place, and time. No cranial nerve deficit.  Skin: Skin is warm and dry.  Psychiatric: She has a normal mood and affect.    ED Course  Procedures (including critical care time)   Labs Reviewed  PROTIME-INR  CBC   Ct Abdomen Pelvis W Contrast  01/05/2011  *RADIOLOGY REPORT*  Clinical Data: Right upper quadrant abdominal pain which is getting worse, some nausea  CT ABDOMEN AND PELVIS WITH CONTRAST  Technique:  Multidetector CT imaging of the abdomen and pelvis was performed following the standard protocol during bolus administration of intravenous contrast.  Contrast: OMNIPAQUE IOHEXOL 300 MG/ML IV SOLN  Comparison: Ultrasound of the abdomen of 12/31/2010  Findings: The lung bases are clear.  The liver is diffusely low in attenuation consistent with fatty infiltration with some sparing near the gallbladder.  No ductal dilatation is seen.  No calcified gallstones are noted.  The pancreas is normal in size and the pancreatic duct is not dilated.  The adrenal glands and spleen are unremarkable.  The stomach is not well distended.  The kidneys enhance with no calculus or mass and no hydronephrosis is seen. The abdominal aorta is normal in caliber.  The celiac axis, SMA, renal arteries and IMA are patent.  No adenopathy is seen.  Within the right lower quadrant, the appendix is prominent and fluid-filled measuring up to 9 mm in diameter.  No periappendiceal strandiness is seen, but early appendicitis is a definite consideration and clinical correlation is recommended.  The terminal ileum is unremarkable.  The urinary bladder is moderately urine distended with no abnormality noted.  The uterus has previously been resected.  No adnexal lesion is seen.  No  fluid is noted within the pelvis.  There is feces throughout the colon. There are pars defects at the L5 level with anterolisthesis of L5 on S1 by 7 mm.  Degenerative disc disease is noted at L4-5 and L5- S1 levels.  There are a few scattered sclerotic bone lesions of questionable significance.  IMPRESSION:  1.  The appendix is prominent and fluid-filled measuring 9 mm in diameter with somewhat thickened wall.  No surrounding inflammatory reaction is  seen, but early appendicitis cannot be excluded. 2.  Fatty infiltration of the liver diffusely. 3.  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 4.  Several sclerotic bone lesions of questionable significance.  Original Report Authenticated By: Juline Patch, M.D.     No diagnosis found.    MDM  This generally well-appearing female presents with several days of right sided abdominal pain. On exam she is in no distress, the patient has a previously connected CT scan double for appendicitis. This CT scan was reviewed by me. The case was also discussed with surgery. Surgery will admit the patient for planned appendectomy.        Gerhard Munch, MD 01/05/11 [phone]

## 2011-01-05 NOTE — Transfer of Care (Signed)
Immediate Anesthesia Transfer of Care Note  [name]  Procedure(s) Performed:  APPENDECTOMY LAPAROSCOPIC  Patient Location: PACU  Anesthesia Type: General  Level of Consciousness: awake, oriented, sedated and patient cooperative  Airway & Oxygen Therapy: Patient Spontanous Breathing and Patient connected to face mask oxygen  Post-op Assessment: Report given to PACU RN, Post -op Vital signs reviewed and stable and Patient moving all extremities  Post vital signs: Reviewed and stable  Complications: No apparent anesthesia complications

## 2011-01-05 NOTE — H&P (Addendum)
Beth Holden is an 65 y.o. female.   Chief Complaint: Abdominal pain HPI: She presented to the [facility] practice last week with some RUQ pain. An ultrasound was negative. Monday her pain had increased somewhat and she was given some pain meds, and today, because of increased pain had a CT scan done which shows a dilated somewhat thick walled appendix without evidence of significant stranding or abscess. Her pain seems more RLQ now and is associated with some anorexia. She has no urinary sx and had a BM earlier. No fever or chills, Pain has been gradually increasing over the course of the day.  Past Medical History  Diagnosis Date  . Rhinitis, allergic   . GERD (gastroesophageal reflux disease)   . Erosive esophagitis   . Peripheral neuropathy   . Hypertension   . IBS (irritable bowel syndrome)   . Hyperlipemia   . Diabetes mellitus type II     Past Surgical History  Procedure Date  . Vaginal hysterectomy 2001  . Oophorectomy 2001  . Tubal ligation   . Rotator cuff repair     left    Family History  Problem Relation Age of Onset  . Lung cancer Mother 36  . Hypertension Father   . Hyperlipidemia Father   . Diabetes Father   . Coronary artery disease Father   . Dementia Father   . Stomach cancer Paternal Aunt   . Brain cancer Paternal Uncle   . Colon cancer Neg Hx   . Irritable bowel syndrome      Several family members on fathers side    Social History:  reports that she has never smoked. She has never used smokeless tobacco. She reports that she does not drink alcohol or use illicit drugs.  Allergies:  Allergies  Allergen Reactions  . Codeine   . Propoxyphene Hcl     Medications Prior to Admission  Medication Dose Route Frequency Provider Last Rate Last Dose  . 0.9 %  sodium chloride infusion   Intravenous Once Gerhard Munch, MD      . iohexol (OMNIPAQUE) 300 MG/ML solution 100 mL  100 mL Intravenous Once PRN Medication Radiologist   100 mL at 01/05/11 1336    Medications Prior to Admission  Medication Sig Dispense Refill  . acetaminophen (TYLENOL) 500 MG tablet Take 500 mg by mouth as needed. pain      . amitriptyline (ELAVIL) 25 MG tablet Take 25 mg by mouth at bedtime.        . cetirizine (ZYRTEC) 10 MG tablet Take 10 mg by mouth daily.       . CRESTOR 5 MG tablet TAKE 1 TABLET AT BEDTIME  90 tablet  2  . enalapril (VASOTEC) 20 MG tablet Take 20 mg by mouth daily.        Marland Kitchen esomeprazole (NEXIUM) 40 MG capsule Take 40 mg by mouth 2 (two) times daily.        . Fluocinolone Acetonide (DERMOTIC) 0.01 % OIL Place in ear(s) as directed.       . gabapentin (NEURONTIN) 300 MG capsule TAKE 1 CAPSULE AT BEDTIME  90 capsule  2  . insulin detemir (LEVEMIR FLEXPEN) 100 UNIT/ML injection Inject 46 Units into the skin at bedtime.       . metFORMIN (GLUMETZA) 1000 MG (MOD) 24 hr tablet Take 1,000 mg by mouth 2 (two) times daily.        . traMADol (ULTRAM) 50 MG tablet Take 50 mg by mouth every 6 (  six) hours as needed. Take 1 tab every 6 hours as needed for pain.         Results for orders placed during the hospital encounter of 01/05/11 (from the past 48 hour(s))  PROTIME-INR     Status: Normal   Collection Time   01/05/11  4:00 PM      Component Value Range Comment   Prothrombin Time 13.5  11.6 - 15.2 (seconds)    INR 1.01  0.00 - 1.49    CBC     Status: Abnormal   Collection Time   01/05/11  4:00 PM      Component Value Range Comment   WBC 10.8 (*) 4.0 - 10.5 (K/uL)    RBC 4.52  3.87 - 5.11 (MIL/uL)    Hemoglobin 12.1  12.0 - 15.0 (g/dL)    HCT 16.1  09.6 - 04.5 (%)    MCV 80.3  78.0 - 100.0 (fL)    MCH 26.8  26.0 - 34.0 (pg)    MCHC 33.3  30.0 - 36.0 (g/dL)    RDW 40.9  81.1 - 91.4 (%)    Platelets 383  150 - 400 (K/uL)    Dg Chest 1 View  01/05/2011  *RADIOLOGY REPORT*  Clinical Data: 65 year old female preoperative study, appendicitis.  CHEST - 1 VIEW  Comparison: 01/26/2007.  Findings: AP upright portable view 1549 hours.  No  pneumoperitoneum is evident.  Slightly lower lung volumes.  Cardiac size and mediastinal contours are within normal limits.  Allowing for portable technique, the lungs are clear.  Postoperative changes to the right humeral head.  IMPRESSION: No acute cardiopulmonary abnormality.  Original Report Authenticated By: Harley Hallmark, M.D.   Ct Abdomen Pelvis W Contrast  01/05/2011  *RADIOLOGY REPORT*  Clinical Data: Right upper quadrant abdominal pain which is getting worse, some nausea  CT ABDOMEN AND PELVIS WITH CONTRAST  Technique:  Multidetector CT imaging of the abdomen and pelvis was performed following the standard protocol during bolus administration of intravenous contrast.  Contrast: OMNIPAQUE IOHEXOL 300 MG/ML IV SOLN  Comparison: Ultrasound of the abdomen of 12/31/2010  Findings: The lung bases are clear.  The liver is diffusely low in attenuation consistent with fatty infiltration with some sparing near the gallbladder.  No ductal dilatation is seen.  No calcified gallstones are noted.  The pancreas is normal in size and the pancreatic duct is not dilated.  The adrenal glands and spleen are unremarkable.  The stomach is not well distended.  The kidneys enhance with no calculus or mass and no hydronephrosis is seen. The abdominal aorta is normal in caliber.  The celiac axis, SMA, renal arteries and IMA are patent.  No adenopathy is seen.  Within the right lower quadrant, the appendix is prominent and fluid-filled measuring up to 9 mm in diameter.  No periappendiceal strandiness is seen, but early appendicitis is a definite consideration and clinical correlation is recommended.  The terminal ileum is unremarkable.  The urinary bladder is moderately urine distended with no abnormality noted.  The uterus has previously been resected.  No adnexal lesion is seen.  No fluid is noted within the pelvis.  There is feces throughout the colon. There are pars defects at the L5 level with anterolisthesis of L5  on S1 by 7 mm.  Degenerative disc disease is noted at L4-5 and L5- S1 levels.  There are a few scattered sclerotic bone lesions of questionable significance.  IMPRESSION:  1.  The appendix is prominent  and fluid-filled measuring 9 mm in diameter with somewhat thickened wall.  No surrounding inflammatory reaction is seen, but early appendicitis cannot be excluded. 2.  Fatty infiltration of the liver diffusely. 3.  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 4.  Several sclerotic bone lesions of questionable significance.  Original Report Authenticated By: Juline Patch, M.D.    Review of Systems  Constitutional: Negative for fever, chills and weight loss.  HENT: Negative.   Eyes: Negative.   Respiratory: Negative.   Cardiovascular: Negative.   Gastrointestinal: Positive for nausea and abdominal pain. Negative for vomiting, diarrhea, constipation and blood in stool.  Genitourinary: Negative.   Musculoskeletal: Negative.   Skin: Negative.   Neurological: Negative.   Endo/Heme/Allergies: Does not bruise/bleed easily.  Psychiatric/Behavioral: Negative.     There were no vitals taken for this visit. Physical Exam  Constitutional: She is oriented to person, place, and time. She appears well-developed and well-nourished. No distress.  HENT:  Head: Normocephalic and atraumatic.  Eyes: EOM are normal. Pupils are equal, round, and reactive to light.  Neck: Normal range of motion. Neck supple. No thyromegaly present.  Cardiovascular: Normal rate, regular rhythm and intact distal pulses.  Exam reveals no gallop and no friction rub.   No murmur heard. Respiratory: Effort normal and breath sounds normal. No respiratory distress. She has no wheezes. She has no rales.  GI: Soft. She exhibits distension. She exhibits no mass. There is tenderness. There is guarding. There is no rebound.       Mild RLQ direct tenderness and guarding, no rebound, only mild distention    Musculoskeletal: Normal range of motion.  Neurological: She is alert and oriented to person, place, and time.  Skin: Skin is warm and dry. No erythema.  Psychiatric: She has a normal mood and affect. Her behavior is normal. Judgment and thought content normal.    Dg Chest 1 View  01/05/2011  *RADIOLOGY REPORT*  Clinical Data: 65 year old female preoperative study, appendicitis.  CHEST - 1 VIEW  Comparison: 01/26/2007.  Findings: AP upright portable view 1549 hours.  No pneumoperitoneum is evident.  Slightly lower lung volumes.  Cardiac size and mediastinal contours are within normal limits.  Allowing for portable technique, the lungs are clear.  Postoperative changes to the right humeral head.  IMPRESSION: No acute cardiopulmonary abnormality.  Original Report Authenticated By: Harley Hallmark, M.D.   Ct Abdomen Pelvis W Contrast  01/05/2011  *RADIOLOGY REPORT*  Clinical Data: Right upper quadrant abdominal pain which is getting worse, some nausea  CT ABDOMEN AND PELVIS WITH CONTRAST  Technique:  Multidetector CT imaging of the abdomen and pelvis was performed following the standard protocol during bolus administration of intravenous contrast.  Contrast: OMNIPAQUE IOHEXOL 300 MG/ML IV SOLN  Comparison: Ultrasound of the abdomen of 12/31/2010  Findings: The lung bases are clear.  The liver is diffusely low in attenuation consistent with fatty infiltration with some sparing near the gallbladder.  No ductal dilatation is seen.  No calcified gallstones are noted.  The pancreas is normal in size and the pancreatic duct is not dilated.  The adrenal glands and spleen are unremarkable.  The stomach is not well distended.  The kidneys enhance with no calculus or mass and no hydronephrosis is seen. The abdominal aorta is normal in caliber.  The celiac axis, SMA, renal arteries and IMA are patent.  No adenopathy is seen.  Within the right lower quadrant, the appendix is prominent  and fluid-filled measuring up  to 9 mm in diameter.  No periappendiceal strandiness is seen, but early appendicitis is a definite consideration and clinical correlation is recommended.  The terminal ileum is unremarkable.  The urinary bladder is moderately urine distended with no abnormality noted.  The uterus has previously been resected.  No adnexal lesion is seen.  No fluid is noted within the pelvis.  There is feces throughout the colon. There are pars defects at the L5 level with anterolisthesis of L5 on S1 by 7 mm.  Degenerative disc disease is noted at L4-5 and L5- S1 levels.  There are a few scattered sclerotic bone lesions of questionable significance.  IMPRESSION:  1.  The appendix is prominent and fluid-filled measuring 9 mm in diameter with somewhat thickened wall.  No surrounding inflammatory reaction is seen, but early appendicitis cannot be excluded. 2.  Fatty infiltration of the liver diffusely. 3.  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 4.  Several sclerotic bone lesions of questionable significance.  Original Report Authenticated By: Juline Patch, M.D.    Assessment/Plan I believe she has early appendicitis I have recommended that we proceed to a lap appy and have discussed it with her and her husband. They understand and wish to proceed. She will need to start antibiotics and get some IV fluids. She ate about 2PM (cup of beans and a few crackers).  STRECK,CHRISTIAN J 01/05/2011, 4:59 PM  The patient was seen and examined by me and I agree with diagnosis and proposed operation.  I have reviewed this with her and will proceed with laparoscopic appendectomy.  Wenda Low, MD

## 2011-01-05 NOTE — Telephone Encounter (Signed)
Sherry from Dr. Myles Rosenthal office called with pt referral.  CT scan shows fluid and possible early appendicitis.  Dr. Janee Morn suggested pt be sent to [facility] ER to be seen today for possible surgery.

## 2011-01-05 NOTE — ED Notes (Signed)
Pt had a ct scan done today and states that she was told to come to er from them due to her appendix is inlarged and needs to see a surgeon, abd pain,

## 2011-01-05 NOTE — Anesthesia Preprocedure Evaluation (Addendum)
Anesthesia Evaluation  Patient identified by MRN, date of birth, ID band Patient awake    Reviewed: Allergy & Precautions, H&P , NPO status , Patient's Chart, lab work & pertinent test results  History of Anesthesia Complications (+) PONV  Airway Mallampati: I TM Distance: >3 FB Neck ROM: Full    Dental No notable dental hx.    Pulmonary neg pulmonary ROS,  clear to auscultation  Pulmonary exam normal       Cardiovascular hypertension, neg cardio ROS Regular Normal    Neuro/Psych  Neuromuscular disease Negative Neurological ROS  Negative Psych ROS   GI/Hepatic negative GI ROS, Neg liver ROS, PUD, GERD-  ,  Endo/Other  Negative Endocrine ROSDiabetes mellitus-, Insulin Dependent  Renal/GU negative Renal ROS  Genitourinary negative   Musculoskeletal negative musculoskeletal ROS (+)   Abdominal   Peds negative pediatric ROS (+)  Hematology negative hematology ROS (+)   Anesthesia Other Findings   Reproductive/Obstetrics negative OB ROS                         Anesthesia Physical Anesthesia Plan  ASA: III and Emergent  Anesthesia Plan: General   Post-op Pain Management:    Induction: Intravenous, Cricoid pressure planned and Rapid sequence  Airway Management Planned: Oral ETT  Additional Equipment:   Intra-op Plan:   Post-operative Plan: Extubation in OR  Informed Consent: I have reviewed the patients History and Physical, chart, labs and discussed the procedure including the risks, benefits and alternatives for the proposed anesthesia with the patient or authorized representative who has indicated his/her understanding and acceptance.   Dental advisory given  Plan Discussed with: CRNA  Anesthesia Plan Comments:       Anesthesia Quick Evaluation

## 2011-01-06 ENCOUNTER — Telehealth: Payer: Self-pay | Admitting: *Deleted

## 2011-01-06 ENCOUNTER — Encounter (HOSPITAL_COMMUNITY): Payer: Self-pay | Admitting: *Deleted

## 2011-01-06 LAB — CBC
HCT: 36.2 % (ref 36.0–46.0)
Hemoglobin: 12.1 g/dL (ref 12.0–15.0)
MCH: 26.6 pg (ref 26.0–34.0)
MCHC: 33.4 g/dL (ref 30.0–36.0)
MCV: 79.6 fL (ref 78.0–100.0)
Platelets: 344 10*3/uL (ref 150–400)
RBC: 4.55 MIL/uL (ref 3.87–5.11)
RDW: 12.9 % (ref 11.5–15.5)
WBC: 10.7 10*3/uL — ABNORMAL HIGH (ref 4.0–10.5)

## 2011-01-06 LAB — COMPREHENSIVE METABOLIC PANEL
ALT: 15 U/L (ref 0–35)
AST: 23 U/L (ref 0–37)
Albumin: 3.3 g/dL — ABNORMAL LOW (ref 3.5–5.2)
Alkaline Phosphatase: 57 U/L (ref 39–117)
BUN: 12 mg/dL (ref 6–23)
CO2: 25 mEq/L (ref 19–32)
Calcium: 9.2 mg/dL (ref 8.4–10.5)
Chloride: 102 mEq/L (ref 96–112)
Creatinine, Ser: 0.5 mg/dL (ref 0.50–1.10)
GFR calc Af Amer: >90 mL/min (ref >90)
GFR calc non Af Amer: >90 mL/min (ref >90)
Glucose, Bld: 305 mg/dL — ABNORMAL HIGH (ref 70–99)
Potassium: 4.3 mEq/L (ref 3.5–5.1)
Sodium: 136 mEq/L (ref 135–145)
Total Bilirubin: 0.3 mg/dL (ref 0.3–1.2)
Total Protein: 6.8 g/dL (ref 6.0–8.3)

## 2011-01-06 LAB — GLUCOSE, CAPILLARY
Glucose-Capillary: 230 mg/dL — ABNORMAL HIGH (ref 70–99)
Glucose-Capillary: 286 mg/dL — ABNORMAL HIGH (ref 70–99)
Glucose-Capillary: 416 mg/dL — ABNORMAL HIGH (ref 70–99)

## 2011-01-06 LAB — HEMOGLOBIN A1C
Hgb A1c MFr Bld: 11.3 % — ABNORMAL HIGH (ref <5.7)
Mean Plasma Glucose: 278 mg/dL — ABNORMAL HIGH (ref <117)

## 2011-01-06 MED ORDER — GABAPENTIN 300 MG PO CAPS: 300.0000 mg | ORAL_CAPSULE | Freq: Every day | ORAL | Status: DC

## 2011-01-06 MED ORDER — INFLUENZA VIRUS VACC SPLIT PF IM SUSP
0.5000 mL | INTRAMUSCULAR | Status: AC | PRN
  Administered 2011-01-06: 0.5 mL via INTRAMUSCULAR
  Filled 2011-01-06: qty 0.5

## 2011-01-06 NOTE — Telephone Encounter (Signed)
Refill request for Gabapentin 300 mg SIG take 1 capsule by mouth at bedtime please Advise refills in 90 day qty

## 2011-01-06 NOTE — Progress Notes (Signed)
Courtesy visit Reviewed record: GI  Eval, CT abd/pelvis, referral to ED and for surgery - lap appy.  She is sitting up in a chair. Reports pain is minimal. She feels good.  CBG 400+ - she is on a sliding scale. She is advised that her CBGs may run up for several days. If she goes home she should continue her home regimen. For CBGs consistently above 250 she should call the office.  Appreciate the great care she has received.

## 2011-01-06 NOTE — Telephone Encounter (Signed)
k x 2

## 2011-01-06 NOTE — Discharge Summary (Signed)
Patient ID: YASMEN CORTNER MRN: [id number] DOB/AGE: [birth year] 65 y.o.  Admit date: 01/05/2011 Discharge date: 01/06/2011  Procedures:  Laparoscopic appendectomy  Consults: Dr. Illene Regulus  Reason for Admission:  65yo female with initially RUQ pain who had a negative workup for her gallbladder.  Her pain then migrated to her RLQ and she had a CT scan completed that revealed an abnormality around her appendix c/w early acute appendicitis.  Admission Diagnoses: 1. Acute appendicitis Patient Active Problem List  Diagnoses  . CANDIDIASIS, VAGINAL  . DIABETES MELLITUS, TYPE II  . DIABETIC PERIPHERAL NEUROPATHY  . HYPERLIPIDEMIA  . UNSPECIFIED INFECTIVE OTITIS EXTERNA  . UNSPECIFIED ESSENTIAL HYPERTENSION  . SINUSITIS - ACUTE-NOS  . TONSILLITIS  . ALLERGIC RHINITIS  . GERD  . CONSTIPATION  . IRRITABLE BOWEL SYNDROME  . CELLULITIS AND ABSCESS OF OTHER SPECIFIED SITE  . ALOPECIA NEC  . UNSPECIFIED DISEASE OF HAIR AND HAIR FOLLICLES  . CHEST PAIN  . COLONIC POLYPS, HX OF  . ANEMIA, MILD  . Acute appendicitis    Hospital Course: 65 yo female who was admitted for acute appendicitis.  She was taken to the operating room where she underwent a laparoscopic appendectomy.  She tolerated the procedure well.  Her diet was advanced as tolerated on POD#  1.  She was felt stable for d/c home if she tolerates her lunch.  Her blood sugars were high, but she was placed on her diabetic medications for better control.   Discharge Diagnoses:  Active Problems:  Acute appendicitis  Patient Active Problem List  Diagnoses  . CANDIDIASIS, VAGINAL  . DIABETES MELLITUS, TYPE II  . DIABETIC PERIPHERAL NEUROPATHY  . HYPERLIPIDEMIA  . UNSPECIFIED INFECTIVE OTITIS EXTERNA  . UNSPECIFIED ESSENTIAL HYPERTENSION  . SINUSITIS - ACUTE-NOS  . TONSILLITIS  . ALLERGIC RHINITIS  . GERD  . CONSTIPATION  . IRRITABLE BOWEL SYNDROME  . CELLULITIS AND ABSCESS OF OTHER SPECIFIED SITE  . ALOPECIA NEC  .  UNSPECIFIED DISEASE OF HAIR AND HAIR FOLLICLES  . CHEST PAIN  . COLONIC POLYPS, HX OF  . ANEMIA, MILD  . Acute appendicitis     Discharge Medications: Current Discharge Medication List    CONTINUE these medications which have NOT CHANGED   Details  acetaminophen (TYLENOL) 500 MG tablet Take 500 mg by mouth as needed. pain    amitriptyline (ELAVIL) 25 MG tablet Take 25 mg by mouth at bedtime.      cetirizine (ZYRTEC) 10 MG tablet Take 10 mg by mouth daily.     CRESTOR 5 MG tablet TAKE 1 TABLET AT BEDTIME Qty: 90 tablet, Refills: 2    enalapril (VASOTEC) 20 MG tablet Take 20 mg by mouth daily.      esomeprazole (NEXIUM) 40 MG capsule Take 40 mg by mouth 2 (two) times daily.      Fluocinolone Acetonide (DERMOTIC) 0.01 % OIL Place in ear(s) as directed.     gabapentin (NEURONTIN) 300 MG capsule TAKE 1 CAPSULE AT BEDTIME Qty: 90 capsule, Refills: 2    insulin detemir (LEVEMIR FLEXPEN) 100 UNIT/ML injection Inject 46 Units into the skin at bedtime.     metFORMIN (GLUMETZA) 1000 MG (MOD) 24 hr tablet Take 1,000 mg by mouth 2 (two) times daily.      traMADol (ULTRAM) 50 MG tablet Take 50 mg by mouth every 6 (six) hours as needed. Take 1 tab every 6 hours as needed for pain.         Discharge Instructions:  Follow-up Information  Follow up with Illene Regulus, MD. (as needed)       Follow up with [facility] on 01/18/2011. (2:00pm, arrive at 1:40pm)          Signed: Bradford Cazier E 01/06/2011, 8:55 AM

## 2011-01-06 NOTE — Anesthesia Postprocedure Evaluation (Signed)
  Anesthesia Post-op Note  [name]  Procedure(s) Performed:  APPENDECTOMY LAPAROSCOPIC  Patient Location: PACU  Anesthesia Type: General  Level of Consciousness: awake and alert   Airway and Oxygen Therapy: Patient Spontanous Breathing  Post-op Pain: mild  Post-op Assessment: Post-op Vital signs reviewed, Patient's Cardiovascular Status Stable, Respiratory Function Stable, Patent Airway and No signs of Nausea or vomiting  Post-op Vital Signs: stable  Complications: No apparent anesthesia complications  

## 2011-01-06 NOTE — Progress Notes (Signed)
1 Day Post-Op  Subjective: Feels good and minimal pain. Tolerating liquids, no nausea. Pre op pain has resolved. Wants to go home  Objective: Vital signs in last 24 hours: Temp:  [97.1 F (36.2 C)-97.9 F (36.6 C)] 97.6 F (36.4 C) (12/27 0500) Pulse Rate:  [96-111] 100  (12/27 0500) Resp:  [16-20] 17  (12/27 0500) BP: (113-158)/(72-86) 113/73 mmHg (12/27 0500) SpO2:  [94 %-99 %] 99 % (12/27 0500)    Intake/Output from previous day: 12/26 0701 - 12/27 0700 In: 3150 [I.V.:3150] Out: 2150 [Urine:2150] Intake/Output this shift: Total I/O In: -  Out: 900 [Urine:900]  General appearance: alert and no distress GI: Abdomen is soft and tender only at incision. no gurading or rebound  Lab Results:   Basename 01/06/11 0420 01/05/11 1600  WBC 10.7* 10.8*  HGB 12.1 12.1  HCT 36.2 36.3  PLT 344 383   BMET  Basename 01/06/11 0420 01/05/11 1526  NA 136 132*  K 4.3 3.9  CL 102 97  CO2 25 24  GLUCOSE 305* 316*  BUN 12 13  CREATININE 0.50 0.60  CALCIUM 9.2 9.5   PT/INR  Basename 01/05/11 1600  LABPROT 13.5  INR 1.01   ABG No results found for this basename: PHART:2,PCO2:2,PO2:2,HCO3:2 in the last 72 hours  Studies/Results: Dg Chest 1 View  01/05/2011  *RADIOLOGY REPORT*  Clinical Data: 65 year old female preoperative study, appendicitis.  CHEST - 1 VIEW  Comparison: 01/26/2007.  Findings: AP upright portable view 1549 hours.  No pneumoperitoneum is evident.  Slightly lower lung volumes.  Cardiac size and mediastinal contours are within normal limits.  Allowing for portable technique, the lungs are clear.  Postoperative changes to the right humeral head.  IMPRESSION: No acute cardiopulmonary abnormality.  Original Report Authenticated By: Harley Hallmark, M.D.   Ct Abdomen Pelvis W Contrast  01/05/2011  *RADIOLOGY REPORT*  Clinical Data: Right upper quadrant abdominal pain which is getting worse, some nausea  CT ABDOMEN AND PELVIS WITH CONTRAST  Technique:  Multidetector  CT imaging of the abdomen and pelvis was performed following the standard protocol during bolus administration of intravenous contrast.  Contrast: OMNIPAQUE IOHEXOL 300 MG/ML IV SOLN  Comparison: Ultrasound of the abdomen of 12/31/2010  Findings: The lung bases are clear.  The liver is diffusely low in attenuation consistent with fatty infiltration with some sparing near the gallbladder.  No ductal dilatation is seen.  No calcified gallstones are noted.  The pancreas is normal in size and the pancreatic duct is not dilated.  The adrenal glands and spleen are unremarkable.  The stomach is not well distended.  The kidneys enhance with no calculus or mass and no hydronephrosis is seen. The abdominal aorta is normal in caliber.  The celiac axis, SMA, renal arteries and IMA are patent.  No adenopathy is seen.  Within the right lower quadrant, the appendix is prominent and fluid-filled measuring up to 9 mm in diameter.  No periappendiceal strandiness is seen, but early appendicitis is a definite consideration and clinical correlation is recommended.  The terminal ileum is unremarkable.  The urinary bladder is moderately urine distended with no abnormality noted.  The uterus has previously been resected.  No adnexal lesion is seen.  No fluid is noted within the pelvis.  There is feces throughout the colon. There are pars defects at the L5 level with anterolisthesis of L5 on S1 by 7 mm.  Degenerative disc disease is noted at L4-5 and L5- S1 levels.  There are a  few scattered sclerotic bone lesions of questionable significance.  IMPRESSION:  1.  The appendix is prominent and fluid-filled measuring 9 mm in diameter with somewhat thickened wall.  No surrounding inflammatory reaction is seen, but early appendicitis cannot be excluded. 2.  Fatty infiltration of the liver diffusely. 3.  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 4.  Several sclerotic bone lesions of questionable significance.   Original Report Authenticated By: Juline Patch, M.D.    Anti-infectives: Anti-infectives     Start     Dose/Rate Route Frequency Ordered Stop   01/05/11 1830   cefOXitin (MEFOXIN) 2 g in dextrose 5 % 50 mL IVPB        2 g 100 mL/hr over 30 Minutes Intravenous 60 min pre-op 01/05/11 1757 01/05/11 1843          Assessment/Plan: s/p Procedure(s): APPENDECTOMY LAPAROSCOPIC Advance diet [place] d/c later today if diet tolerated thru lunch  LOS: 1 day    Kenyette Gundy J 01/06/2011

## 2011-01-07 LAB — GLUCOSE, CAPILLARY: Glucose-Capillary: 286 mg/dL — ABNORMAL HIGH (ref 70–99)

## 2011-01-13 ENCOUNTER — Other Ambulatory Visit (HOSPITAL_COMMUNITY): Payer: BC Managed Care – PPO

## 2011-01-14 ENCOUNTER — Encounter: Payer: Self-pay | Admitting: Internal Medicine

## 2011-01-18 ENCOUNTER — Encounter (INDEPENDENT_AMBULATORY_CARE_PROVIDER_SITE_OTHER): Payer: Self-pay | Admitting: Radiology

## 2011-01-18 ENCOUNTER — Ambulatory Visit (INDEPENDENT_AMBULATORY_CARE_PROVIDER_SITE_OTHER): Payer: BC Managed Care – PPO | Admitting: Radiology

## 2011-01-18 VITALS — BP 132/86 | HR 104 | Temp 98.9°F | Resp 16 | Ht 67.0 in | Wt 185.0 lb

## 2011-01-18 DIAGNOSIS — K37 Unspecified appendicitis: Secondary | ICD-10-CM

## 2011-01-18 NOTE — Progress Notes (Signed)
Beth Holden 18-Oct-1945 [id number] 01/18/2011   History of Present Illness: Beth Holden is a  66 y.o. female who presents today status post lap appy.  Pathology reveals mild appendicitis Interestingly, she is still having the same RUQ quadrant sxs that were present before her appy, but they have improved some.   I reviewed her [place] and CT and there is no findings of gallbladder disease.   She is tolerating a regular diet, having normal bowel movements, has good pain control.  She is back to most normal activities.   Physical Exam: Abd: soft, nontender, active bowel sounds, nondistended.  All incisions are well healed.  Impression: 1.  Acute appendicitis, s/p lap appy  Plan: She  is able to return to normal activities. She  may follow up on a prn basis.

## 2011-01-27 ENCOUNTER — Other Ambulatory Visit: Payer: Self-pay | Admitting: *Deleted

## 2011-01-27 MED ORDER — INSULIN DETEMIR 100 UNIT/ML ~~LOC~~ SOLN: 46.0000 [IU] | Freq: Every day | SUBCUTANEOUS | Status: DC

## 2011-01-27 MED ORDER — GABAPENTIN 300 MG PO CAPS: 300.0000 mg | ORAL_CAPSULE | Freq: Every day | ORAL | Status: DC

## 2011-01-27 NOTE — Progress Notes (Signed)
[facility] states they did not receive Rx printed on 12.27.12

## 2011-01-31 ENCOUNTER — Telehealth: Payer: Self-pay

## 2011-01-31 NOTE — Telephone Encounter (Signed)
[facility] called [email or web address] regarding Levemir. They are requesting a call back at [phone] using ref # [id number]

## 2011-02-07 ENCOUNTER — Ambulatory Visit (INDEPENDENT_AMBULATORY_CARE_PROVIDER_SITE_OTHER): Payer: BC Managed Care – PPO | Admitting: Internal Medicine

## 2011-02-07 ENCOUNTER — Encounter: Payer: Self-pay | Admitting: Internal Medicine

## 2011-02-07 VITALS — BP 108/70 | HR 93 | Temp 98.6°F | Resp 14 | Ht 66.0 in | Wt 186.2 lb

## 2011-02-07 DIAGNOSIS — D649 Anemia, unspecified: Secondary | ICD-10-CM

## 2011-02-07 DIAGNOSIS — K219 Gastro-esophageal reflux disease without esophagitis: Secondary | ICD-10-CM

## 2011-02-07 DIAGNOSIS — I10 Essential (primary) hypertension: Secondary | ICD-10-CM

## 2011-02-07 DIAGNOSIS — E785 Hyperlipidemia, unspecified: Secondary | ICD-10-CM

## 2011-02-07 DIAGNOSIS — Z0000 Encounter for general adult medical examination without abnormal findings: Secondary | ICD-10-CM

## 2011-02-07 DIAGNOSIS — E119 Type 2 diabetes mellitus without complications: Secondary | ICD-10-CM

## 2011-02-07 DIAGNOSIS — E1149 Type 2 diabetes mellitus with other diabetic neurological complication: Secondary | ICD-10-CM

## 2011-02-07 MED ORDER — ENALAPRIL MALEATE 20 MG PO TABS: ORAL_TABLET | ORAL | Status: DC

## 2011-02-07 MED ORDER — FLUOCINOLONE ACETONIDE 0.01 % OT OIL: 2.0000 [drp] | TOPICAL_OIL | Freq: Two times a day (BID) | OTIC | Status: DC | PRN

## 2011-02-07 MED ORDER — AMITRIPTYLINE HCL 25 MG PO TABS: 25.0000 mg | ORAL_TABLET | Freq: Every day | ORAL | Status: DC

## 2011-02-07 MED ORDER — GABAPENTIN 300 MG PO CAPS: 300.0000 mg | ORAL_CAPSULE | Freq: Every day | ORAL | Status: DC

## 2011-02-07 MED ORDER — METFORMIN HCL ER (MOD) 1000 MG PO TB24: ORAL_TABLET | ORAL | Status: DC

## 2011-02-07 MED ORDER — ROSUVASTATIN CALCIUM 5 MG PO TABS: ORAL_TABLET | ORAL | Status: DC

## 2011-02-07 MED ORDER — INSULIN PEN NEEDLE 31G X 8 MM MISC: 46.0000 [IU] | Freq: Every day | Status: DC

## 2011-02-07 MED ORDER — ESOMEPRAZOLE MAGNESIUM 40 MG PO CPDR: DELAYED_RELEASE_CAPSULE | ORAL | Status: DC

## 2011-02-07 MED ORDER — INSULIN DETEMIR 100 UNIT/ML ~~LOC~~ SOLN: 46.0000 [IU] | Freq: Every day | SUBCUTANEOUS | Status: DC

## 2011-02-07 NOTE — Progress Notes (Signed)
Subjective:    Patient ID: Beth Holden, female    DOB: [birth year], 66 y.o.   MRN: [id number]  HPI The patient is here for annual wellness examination and management of other chronic and acute problems. She is appropriately grieving for the loss of her father recently - 110 y/o died of pneumonia and alzheimers.  She reports that her CBGs have been running high-during this period of stress. She does find she is more forgetful. She does take care of her grand-daughter. She has made a full recovery for laproscopic appendectomy. She had some residual RUQ pain which is now much better. She has been released by her surgeon.    The risk factors are reflected in the social history.  The roster of all physicians providing medical care to patient - is listed in the Snapshot section of the chart.  Activities of daily living:  The patient is 100% inedpendent in all ADLs: dressing, toileting, feeding as well as independent mobility  Home safety : The patient has smoke detectors in the home. Fall- household is safe, no grab bars. They wear seatbelts. No firearms at home.  There is no violence in the home.   There is no risks for hepatitis, STDs or HIV. There is no history of blood transfusion. They have no travel history to infectious disease endemic areas of the world.  The patient has seen their dentist in the last six month. They have seen their eye doctor in the last year - followed for increased intraocular pressure. They deny any hearing difficulty and have not had audiologic testing in the last year.  They do not  have excessive sun exposure. Discussed the need for sun protection: hats, long sleeves and use of sunscreen if there is significant sun exposure.   Diet: the importance of a healthy diet is discussed. They do have a healthy diet.  The patient has a regular exercise program: walking , 30 min duration, 3 per week.  The benefits of regular aerobic exercise were discussed.  Depression  screen: there are no signs or vegative symptoms of depression- irritability, change in appetite, anhedonia, sadness/tearfullness.  Cognitive assessment: the patient manages all their financial and personal affairs and is actively engaged.   The following portions of the patient's history were reviewed and updated as appropriate: allergies, current medications, past family history, past medical history,  past surgical history, past social history  and problem list.  Vision, hearing, body mass index were assessed and reviewed.   During the course of the visit the patient was educated and counseled about appropriate screening and preventive services including : fall prevention , diabetes screening, nutrition counseling, colorectal cancer screening, and recommended immunizations.  Past Medical History  Diagnosis Date  . Rhinitis, allergic   . GERD (gastroesophageal reflux disease)   . Erosive esophagitis   . Peripheral neuropathy   . Hypertension   . IBS (irritable bowel syndrome)   . Hyperlipemia   . Diabetes mellitus type II   . Acute appendicitis 01/05/2011  . ALLERGIC RHINITIS 10/12/2006  . ALOPECIA NEC 10/11/2006  . ANEMIA, MILD 02/04/2010  . CANDIDIASIS, VAGINAL 10/22/2008  . Cellulitis and abscess of other specified site 10/24/2008  . CHEST PAIN 01/26/2007  . COLONIC POLYPS, HX OF 10/12/2006  . CONSTIPATION 10/31/2007  . DIABETES MELLITUS, TYPE II 10/12/2006  . GERD 10/12/2006  . HYPERLIPIDEMIA 10/12/2006  . Irritable bowel syndrome 10/12/2006  . SINUSITIS - ACUTE-NOS 10/31/2009  . TONSILLITIS 06/05/2008  . Unspecified disease of hair and  hair follicles 01/31/2007  . Unspecified essential hypertension 12/05/2006  . UNSPECIFIED INFECTIVE OTITIS EXTERNA 10/22/2008   Past Surgical History  Procedure Date  . Vaginal hysterectomy 2001  . Oophorectomy 2001  . Tubal ligation   . Rotator cuff repair     left  . Laparoscopic appendectomy 01/05/2011    Procedure: APPENDECTOMY  LAPAROSCOPIC;  Surgeon: Valarie Merino, MD;  Location: [facility];  Service: General;  Laterality: N/A;   Family History  Problem Relation Age of Onset  . Lung cancer Mother 30  . Cancer Mother     lung  . Hypertension Father   . Hyperlipidemia Father   . Diabetes Father   . Coronary artery disease Father   . Dementia Father   . Stomach cancer Paternal Aunt   . Cancer Paternal Aunt     stomach  . Brain cancer Paternal Uncle   . Cancer Paternal Uncle     brain  . Colon cancer Neg Hx   . Irritable bowel syndrome      Several family members on fathers side   . Diabetes Other   . Cancer Maternal Aunt     stomach   History   Social History  . Marital Status: Married    Spouse Name: N/A    Number of Children: 2  . Years of Education: N/A   Occupational History  . Retired     Social History Main Topics  . Smoking status: Never Smoker   . Smokeless tobacco: Never Used  . Alcohol Use: No  . Drug Use: No  . Sexually Active: Yes    Birth Control/ Protection: Post-menopausal   Other Topics Concern  . Not on file   Social History Narrative   Caffeine daily HSG, [employer] no diplomaMarried '661 dtr- '78; 1 son '71; 2 grandchildrenOccupation: retired 04Dad with alzheimers-had to place in [place] (summer '10)        Review of Systems Constitutional:  Negative for fever, chills, activity change and unexpected weight change.  HEENT:  Negative for hearing loss, ear pain, congestion, neck stiffness and postnasal drip. Negative for sore throat or swallowing problems. Negative for dental complaints.   Eyes: Negative for vision loss or change in visual acuity.  Respiratory: Negative for chest tightness and wheezing. Negative for DOE.   Cardiovascular: Negative for chest pain or palpitations. No decreased exercise tolerance Gastrointestinal: No change in bowel habit. No bloating or gas. No reflux or indigestion Genitourinary: Negative for urgency, frequency, flank pain and difficulty  urinating.  Musculoskeletal: Negative for myalgias, back pain, and gait problem. Has intermittent pain right UE Neurological: Negative for dizziness, tremors, weakness and headaches.  Hematological: Negative for adenopathy.  Psychiatric/Behavioral: Negative for behavioral problems and dysphoric mood.       Objective:   Physical Exam Filed Vitals:   02/07/11 0915  BP: 108/70  Pulse: 93  Temp: 98.6 F (37 C)  Resp: 14   Gen'l - heavyset white woman in no distress HEENT- normal head, C&S clear, oropharynx w/o lesions, EACs with scan cerumen, TM's normal Neck- supple, w/o thyromegaly Nodes - negative in the cervical and supraclavicular region Pulm - normal respirations, normal breath sounds Breast - deferred to Gyn Cor - 2+ radial and DP pulses, RRR w/o murmur, no JVD, no carotid bruits Abdomen - BS+, well healed wounds from laproscopic procedure, no guarding or rebound Pelvic/Rectal - deferred to gyn Ext - no deformity, no evidence of acute inflammation small or medium joints Neuro - A&O x  3, CN II-XII normal, normal gait and strength, decreased deep vibratory sensation right foot Derm - no suspicious lesions face, neck, abdomen  Lab Results  Component Value Date   WBC 10.7* 01/06/2011   HGB 12.1 01/06/2011   HCT 36.2 01/06/2011   PLT 344 01/06/2011   GLUCOSE 305* 01/06/2011   CHOL 146 02/04/2010   TRIG 85.0 02/04/2010   HDL 44.90 02/04/2010   LDLCALC 84 02/04/2010   ALT 15 01/06/2011   AST 23 01/06/2011   NA 136 01/06/2011   K 4.3 01/06/2011   CL 102 01/06/2011   CREATININE 0.50 01/06/2011   BUN 12 01/06/2011   CO2 25 01/06/2011   TSH 1.81 10/26/2007   INR 1.01 01/05/2011   HGBA1C 11.3* 01/05/2011          Assessment & Plan:  .

## 2011-02-07 NOTE — Patient Instructions (Signed)
Diabetes - 11.3% too high. Plan - re-titrate the levemir: check fasting AM blood sugar. If it is 150+ three days in a row increase the levemir by 3 units. Continue this cycle until you have good AM control. Return for lab in 3 months - early May '13.  All your labs Dec 27th except for blood sugar were normal and do not need to be repeated.  Exam today is normal except for decreased sensation in your feet.   All Rx will be renewed with [facility].

## 2011-02-08 NOTE — Assessment & Plan Note (Signed)
Lab Results  Component Value Date   WBC 10.7* 01/06/2011   HGB 12.1 01/06/2011   HCT 36.2 01/06/2011   MCV 79.6 01/06/2011   PLT 344 01/06/2011   Normal hemoglobin

## 2011-02-08 NOTE — Assessment & Plan Note (Signed)
On exam - poor deep vibratory sensation. No foot wounds  Plan - continue DM management           Check feet daily.

## 2011-02-08 NOTE — Assessment & Plan Note (Signed)
Interval medical history notable for lap appy in Dec '12. Interval social history notable for the death of her father. Physical exam, sans breast and pelvic, is normal. She is current with her gynecologist.  Labs from Dec '12 hospitalization reviewed - OK except for A1C 11.3%. She is current with colorectal cancer and breast cancer screening. Immunizations: up to date except for pneumonia vaccine - she may have at her convenience.  In summary - a nice woman who is medically stable but whose diabetes is out of control. She is encouraged to resume her healthy life-style: good diet and regular exercise, to adjust up her levemit. She will return for lab in 3 months. She will otherwise return as needed.

## 2011-02-08 NOTE — Assessment & Plan Note (Signed)
Stable with no c/o at today's visit

## 2011-02-08 NOTE — Assessment & Plan Note (Signed)
Full recovery from surgery and doing well.

## 2011-02-08 NOTE — Assessment & Plan Note (Signed)
Lab Results  Component Value Date   CHOL 146 02/04/2010   HDL 44.90 02/04/2010   LDLCALC 84 02/04/2010   TRIG 85.0 02/04/2010   CHOLHDL 3 02/04/2010   Good control. Continue present dose of crestor

## 2011-02-08 NOTE — Assessment & Plan Note (Signed)
Lab Results  Component Value Date   HGBA1C 11.3* 01/05/2011   Out of control.  Plan - repeat levemir titration on 3 day schedule           Continue metformin           Recheck A1C in 3 months - if poorly controlled will adjust regimen

## 2011-02-08 NOTE — Assessment & Plan Note (Signed)
BP Readings from Last 3 Encounters:  02/07/11 108/70  01/18/11 132/86  01/06/11 132/85   Good control on present regimen, including ACE-I  Plan - continue present medications

## 2011-03-03 ENCOUNTER — Other Ambulatory Visit: Payer: Self-pay | Admitting: *Deleted

## 2011-03-03 MED ORDER — INSULIN DETEMIR 100 UNIT/ML ~~LOC~~ SOLN: 46.0000 [IU] | Freq: Every day | SUBCUTANEOUS | Status: DC

## 2011-03-03 MED ORDER — METFORMIN HCL 1000 MG PO TABS: 1000.0000 mg | ORAL_TABLET | Freq: Two times a day (BID) | ORAL | Status: DC

## 2011-05-03 ENCOUNTER — Other Ambulatory Visit: Payer: Self-pay | Admitting: *Deleted

## 2011-05-03 MED ORDER — INSULIN DETEMIR 100 UNIT/ML ~~LOC~~ SOLN: 46.0000 [IU] | Freq: Every day | SUBCUTANEOUS | Status: DC

## 2011-05-03 NOTE — Progress Notes (Signed)
Per mail pharmacy, 30 ml only 65 day supply; request 90 day, reorder for 48 ml. Patient informed via VM/SLS

## 2011-05-06 ENCOUNTER — Other Ambulatory Visit: Payer: Self-pay

## 2011-05-06 MED ORDER — INSULIN DETEMIR 100 UNIT/ML ~~LOC~~ SOLN: 46.0000 [IU] | Freq: Every day | SUBCUTANEOUS | Status: DC

## 2011-06-21 ENCOUNTER — Encounter: Payer: Self-pay | Admitting: Internal Medicine

## 2011-06-21 ENCOUNTER — Ambulatory Visit (INDEPENDENT_AMBULATORY_CARE_PROVIDER_SITE_OTHER): Payer: BC Managed Care – PPO | Admitting: Internal Medicine

## 2011-06-21 ENCOUNTER — Telehealth: Payer: Self-pay | Admitting: Internal Medicine

## 2011-06-21 VITALS — BP 116/72 | HR 100 | Temp 98.0°F | Resp 16 | Ht 66.0 in | Wt 184.0 lb

## 2011-06-21 DIAGNOSIS — T148 Other injury of unspecified body region: Secondary | ICD-10-CM

## 2011-06-21 DIAGNOSIS — E119 Type 2 diabetes mellitus without complications: Secondary | ICD-10-CM

## 2011-06-21 DIAGNOSIS — E11621 Type 2 diabetes mellitus with foot ulcer: Secondary | ICD-10-CM

## 2011-06-21 DIAGNOSIS — L98499 Non-pressure chronic ulcer of skin of other sites with unspecified severity: Secondary | ICD-10-CM

## 2011-06-21 DIAGNOSIS — E1169 Type 2 diabetes mellitus with other specified complication: Secondary | ICD-10-CM

## 2011-06-21 DIAGNOSIS — T148XXA Other injury of unspecified body region, initial encounter: Secondary | ICD-10-CM

## 2011-06-21 DIAGNOSIS — W57XXXA Bitten or stung by nonvenomous insect and other nonvenomous arthropods, initial encounter: Secondary | ICD-10-CM

## 2011-06-21 NOTE — Patient Instructions (Signed)
For slow to heal lesion on the left shin: twice a day - wash with soap and water and wash cloth to vigoursly clean. Dry. Dampen a 2x2 gauze with sterile saline and press against the wound, cover with a dry 2x2 gauze and tape in place ( use paper tape). When you change the dressing do it with elan - to "debride the coagulam" (pull off the yellow gook).   Diabetic toe ulcer - will refer to the wound center.

## 2011-06-21 NOTE — Telephone Encounter (Signed)
Pt is requesting today appt w/you concerning a sore place on leg and you have no availability--Do you desire pt worked in

## 2011-06-22 NOTE — Progress Notes (Signed)
Subjective:    Patient ID: Beth Holden, female    DOB: [birth year], 66 y.o.   MRN: [id number]  HPI Mrs. Sane presents due to a slow to heal insect bit distal left leg. There has been a small wound, 2 mm, for 3 weeks with some minor pain and mild erythema. She has noted a small amount yellow coagulum. She has been washing the wound and applying otc cortisone cream.  She has a diabetic ulcer and callus at the tip of her right great toe. Her podiatrist has been treating this and has proposed a skin graft. She wants a second opinion.  For the past several weeks her CBGs have been running high. She attributes this to less exercise. She has titrated up her levemir dose to 56 units qHS. She may me due for A1C.  Past Medical History  Diagnosis Date  . Rhinitis, allergic   . GERD (gastroesophageal reflux disease)   . Erosive esophagitis   . Peripheral neuropathy   . Hypertension   . IBS (irritable bowel syndrome)   . Hyperlipemia   . Diabetes mellitus type II   . Acute appendicitis 01/05/2011  . ALLERGIC RHINITIS 10/12/2006  . ALOPECIA NEC 10/11/2006  . ANEMIA, MILD 02/04/2010  . CANDIDIASIS, VAGINAL 10/22/2008  . Cellulitis and abscess of other specified site 10/24/2008  . CHEST PAIN 01/26/2007  . COLONIC POLYPS, HX OF 10/12/2006  . CONSTIPATION 10/31/2007  . DIABETES MELLITUS, TYPE II 10/12/2006  . GERD 10/12/2006  . HYPERLIPIDEMIA 10/12/2006  . Irritable bowel syndrome 10/12/2006  . SINUSITIS - ACUTE-NOS 10/31/2009  . TONSILLITIS 06/05/2008  . Unspecified disease of hair and hair follicles 01/31/2007  . Unspecified essential hypertension 12/05/2006  . UNSPECIFIED INFECTIVE OTITIS EXTERNA 10/22/2008   Past Surgical History  Procedure Date  . Vaginal hysterectomy 2001  . Oophorectomy 2001  . Tubal ligation   . Rotator cuff repair     left  . Laparoscopic appendectomy 01/05/2011    Procedure: APPENDECTOMY LAPAROSCOPIC;  Surgeon: Valarie Merino, MD;  Location: [facility];  Service:  General;  Laterality: N/A;   Family History  Problem Relation Age of Onset  . Lung cancer Mother 44  . Cancer Mother     lung  . Hypertension Father   . Hyperlipidemia Father   . Diabetes Father   . Coronary artery disease Father   . Dementia Father   . Stomach cancer Paternal Aunt   . Cancer Paternal Aunt     stomach  . Brain cancer Paternal Uncle   . Cancer Paternal Uncle     brain  . Colon cancer Neg Hx   . Irritable bowel syndrome      Several family members on fathers side   . Diabetes Other   . Cancer Maternal Aunt     stomach   History   Social History  . Marital Status: Married    Spouse Name: N/A    Number of Children: 2  . Years of Education: N/A   Occupational History  . Retired     Social History Main Topics  . Smoking status: Never Smoker   . Smokeless tobacco: Never Used  . Alcohol Use: No  . Drug Use: No  . Sexually Active: Yes    Birth Control/ Protection: Post-menopausal   Other Topics Concern  . Not on file   Social History Narrative   Caffeine daily HSG, [employer] no diplomaMarried '661 dtr- '78; 1 son '71; 2 grandchildrenOccupation: retired 04Dad with alzheimers-had to place  in [place] (summer '10)       Review of Systems System review is negative for any constitutional, cardiac, pulmonary, GI or neuro symptoms or complaints other than as described in the HPI.     Objective:   Physical Exam Filed Vitals:   06/21/11 1559  BP: 116/72  Pulse: 100  Temp: 98 F (36.7 C)  Resp: 16   Weight: 184 lb (83.462 kg)   Gen'l- overweight white woman in no distress Cor- RRR PUlm - normal respirations Neuro - A&O x 3, CN II-XII normal, normal gait Derm - small lesion/ulcer left anterior shin 2-3 mm with deep center and visibile yellow coagulum. Minimal surrounding erythema  Tip of the right great toe with large callus and ulcerated center. Callus also on the tip of the 3rd & 4th toes       Assessment & Plan:  Skin lesion - from insect  bite. Plan - wet to dry dressing BID  Diabetic foot ulcer - tip of right Great toe Plan - refer to wound center.

## 2011-06-22 NOTE — Assessment & Plan Note (Signed)
Lab Results  Component Value Date   HGBA1C 11.3* 01/05/2011   Patient has been started on basal insulin therapy. Her CBGs are running consistently greater than 150  Plan - continue to titrate up the levemir dose as instructed to attain AM CBG consistently less than 150  Defer A1C until under better control.

## 2011-07-05 ENCOUNTER — Encounter (HOSPITAL_BASED_OUTPATIENT_CLINIC_OR_DEPARTMENT_OTHER): Payer: BC Managed Care – PPO | Attending: General Surgery

## 2011-07-05 DIAGNOSIS — I10 Essential (primary) hypertension: Secondary | ICD-10-CM | POA: Insufficient documentation

## 2011-07-05 DIAGNOSIS — E1169 Type 2 diabetes mellitus with other specified complication: Secondary | ICD-10-CM | POA: Insufficient documentation

## 2011-07-05 DIAGNOSIS — Z79899 Other long term (current) drug therapy: Secondary | ICD-10-CM | POA: Insufficient documentation

## 2011-07-05 DIAGNOSIS — L97509 Non-pressure chronic ulcer of other part of unspecified foot with unspecified severity: Secondary | ICD-10-CM | POA: Insufficient documentation

## 2011-07-05 LAB — GLUCOSE, CAPILLARY: Glucose-Capillary: 232 mg/dL — ABNORMAL HIGH (ref 70–99)

## 2011-07-05 NOTE — Progress Notes (Signed)
[facility]  NAME:  Beth Holden, Beth Holden              ACCOUNT NO.:  [id number]  MEDICAL RECORD NO.:  [id number]      DATE OF BIRTH:  [birth year]  PHYSICIAN:  Ardath Sax, M.D.           VISIT DATE:                                  OFFICE VISIT   This is a 66 year old obese female who has a history of diabetes who enters with a 2-1/2 months history of ulcers on her right foot.  She has ulcers on the tips of her 1st, 2nd and 3rd toes.  She is a type 2 diabetic.  She has an x-ray that she brought here with her that shows no osteo.  We washed these things and I debrided the callus off of the ulcers.  They are all about 0.5 to 1 cm in size, and we put her in a Darco boot to offload and we will plan on doing a Dermagraft which I think would be helpful.  In the meantime, we are going to use collagen dressings.  This lady has a history of hypertension and she is on Vasotec, Neurontin, Levemir, metformin and she also takes vitamins, Crestor and Elavil.  Her blood pressure is 120/80, her temperature is 98.6.  Her lungs are clear.  She has no problems other than her diabetic problems with her diabetic foot ulcers.  She is an obese lady, but her blood sugar was 109.  She recently underwent a laparoscopic appendectomy and made a good recovery.  We debrided these and put on collagen and we will see her back in a week and perhaps we could use a Dermagraft.     Ardath Sax, M.D.     PP/MEDQ  D:  07/05/2011  T:  07/05/2011  Job:  [id number]

## 2011-07-11 ENCOUNTER — Encounter (HOSPITAL_BASED_OUTPATIENT_CLINIC_OR_DEPARTMENT_OTHER): Payer: BC Managed Care – PPO | Attending: General Surgery

## 2011-07-11 DIAGNOSIS — K219 Gastro-esophageal reflux disease without esophagitis: Secondary | ICD-10-CM | POA: Insufficient documentation

## 2011-07-11 DIAGNOSIS — Z79899 Other long term (current) drug therapy: Secondary | ICD-10-CM | POA: Insufficient documentation

## 2011-07-11 DIAGNOSIS — E1169 Type 2 diabetes mellitus with other specified complication: Secondary | ICD-10-CM | POA: Insufficient documentation

## 2011-07-11 DIAGNOSIS — G609 Hereditary and idiopathic neuropathy, unspecified: Secondary | ICD-10-CM | POA: Insufficient documentation

## 2011-07-11 DIAGNOSIS — E785 Hyperlipidemia, unspecified: Secondary | ICD-10-CM | POA: Insufficient documentation

## 2011-07-11 DIAGNOSIS — I10 Essential (primary) hypertension: Secondary | ICD-10-CM | POA: Insufficient documentation

## 2011-07-11 DIAGNOSIS — L97509 Non-pressure chronic ulcer of other part of unspecified foot with unspecified severity: Secondary | ICD-10-CM | POA: Insufficient documentation

## 2011-07-12 NOTE — Progress Notes (Signed)
[facility]  NAME:  Beth Holden, Beth Holden              ACCOUNT NO.:  [id number]  MEDICAL RECORD NO.:  [id number]      DATE OF BIRTH:  [birth year]  PHYSICIAN:  Wayland Denis, DO       VISIT DATE:  07/11/2011                                  OFFICE VISIT   The patient is a 66 year old female, who is here for followup on her right lower extremity ulcers.  She has one at the tip of her great toe, second and third toe.  She has sustained these when she was wearing a pair of shoes without socks and caused some pressure on them.  She has been seeing a podiatrist and saw Dr. Jimmey Ralph last week.  They do appear to be superficial.  There does not appear to be bone involvement or osteomyelitis.  There is no drainage.  The description is noted in the notes.  REVIEW OF SYSTEMS:  Negative.  SOCIAL HISTORY:  She does not smoke, lives at home.  PAST MEDICAL HISTORY:  Positive for diabetes, erosive esophagitis, gastroesophageal reflux, peripheral neuropathy, hypertension, IBS, allergic rhinitis, and hyperlipidemia.  PAST SURGICAL HISTORY:  She has had a history of rotator cuff repair, laparoscopy, tubal ligation, and hysterectomy with oophorectomy.  MEDICATIONS:  Include Elavil, Crestor, Vasotec, Nexium, Neurontin, Levemir, metformin, acetaminophen, and __________.  ALLERGIES:  No known drug allergies.  PHYSICAL EXAMINATION:  GENERAL:  She is alert, oriented, cooperative, not in any acute distress.  She is pleasant. HEENT:  Pupils are equal.  Extraocular muscles intact.  No cervical lymphadenopathy. LUNGS:  Breathing is unlabored. HEART:  Regular. ABDOMEN:  Soft. EXTREMITIES:  Her pulses are strong in her upper extremities.  She does have good capillary refill in the lower extremities.  Recommend Silvercel, elevation, increase her protein, and continue with the Darco shoe.  We will see her back in 1 week.     Wayland Denis, DO     CS/MEDQ  D:  07/11/2011  T:   07/11/2011  Job:  [id number]

## 2011-07-19 NOTE — Progress Notes (Signed)
[facility]  NAME:  Beth Holden, Beth Holden                   ACCOUNT NO.:  MEDICAL RECORD NO.:  [id number]      DATE OF BIRTH:  09-Sep-1945  PHYSICIAN:  Wayland Denis, DO       VISIT DATE:  07/18/2011                                  OFFICE VISIT   The patient is a 66 year old female, who is here for followup on her right lower extremity toe ulcers.  She was debrided last week.  She looks very good today.  She has been using a Silvercel with some very good improvement.  There is no sign of increased in the depth on the sign of infection.  MEDICATIONS:  Unchanged.  SOCIAL HISTORY:  Unchanged.  REVIEW OF SYSTEMS:  Otherwise, negative.  PHYSICAL EXAMINATION:  GENERAL: She is alert, oriented, cooperative, not in any acute distress.  She is pleasant. EYES: Pupils equal.  Extraocular muscles intact. NECK: No cervical lymphadenopathy. LUNGS: Breathing is unlabored. HEART: Regular. ABDOMEN: Soft.  We will continue with Silvercel and have her followup in 1 week.  We reviewed the need for multivitamin, vitamin C, zinc, and increasing her protein.     Wayland Denis, DO     CS/MEDQ  D:  07/18/2011  T:  07/18/2011  Job:  [id number]

## 2011-08-01 ENCOUNTER — Encounter: Payer: Self-pay | Admitting: Internal Medicine

## 2011-08-01 ENCOUNTER — Ambulatory Visit (INDEPENDENT_AMBULATORY_CARE_PROVIDER_SITE_OTHER): Payer: BC Managed Care – PPO | Admitting: Internal Medicine

## 2011-08-01 ENCOUNTER — Telehealth: Payer: Self-pay | Admitting: Internal Medicine

## 2011-08-01 VITALS — BP 128/70 | HR 113 | Temp 97.3°F | Ht 66.5 in | Wt 191.1 lb

## 2011-08-01 DIAGNOSIS — J029 Acute pharyngitis, unspecified: Secondary | ICD-10-CM

## 2011-08-01 DIAGNOSIS — J019 Acute sinusitis, unspecified: Secondary | ICD-10-CM

## 2011-08-01 DIAGNOSIS — E119 Type 2 diabetes mellitus without complications: Secondary | ICD-10-CM

## 2011-08-01 MED ORDER — FLUCONAZOLE 150 MG PO TABS: 150.0000 mg | ORAL_TABLET | Freq: Once | ORAL | Status: AC

## 2011-08-01 MED ORDER — AZITHROMYCIN 250 MG PO TABS
ORAL_TABLET | ORAL | Status: AC
Start: 2011-08-01 — End: 2011-08-06

## 2011-08-01 NOTE — Progress Notes (Signed)
  Subjective:    HPI  complains of sore throat symptoms  Onset >1 week ago, wax/wane symptoms  associated with sneezing, sore throat, mild headache and low grade fever -  Also myalgias, sinus pressure and mild-mod chest congestion >clear-yellow mucus, blood tinged at times No relief with OTC meds Precipitated by sick contacts  Past Medical History  Diagnosis Date  . Rhinitis, allergic   . GERD (gastroesophageal reflux disease)   . Erosive esophagitis   . Peripheral neuropathy   . Hypertension   . IBS (irritable bowel syndrome)   . Hyperlipemia   . Diabetes mellitus type II   . Acute appendicitis 01/05/2011  . ALLERGIC RHINITIS   . ALOPECIA NEC   . DIABETES MELLITUS, TYPE II     Review of Systems Constitutional: No fever or night sweats, no unexpected weight change Pulmonary: No pleurisy or hemoptysis Cardiovascular: No chest pain or palpitations     Objective:   Physical Exam BP 128/70  Pulse 113  Temp 97.3 F (36.3 C) (Oral)  Ht 5' 6.5" (1.689 m)  Wt 191 lb 1.9 oz (86.691 kg)  BMI 30.39 kg/m2  SpO2 97% GEN: mildly ill appearing and audible head/chest congestion HENT: NCAT, mild sinus tenderness bilaterally, nares with clear discharge, oropharynx mild erythema, no exudate Eyes: Vision grossly intact, no conjunctivitis Lungs: Clear to auscultation without rhonchi or wheeze, no increased work of breathing Cardiovascular: Regular rate and rhythm, no bilateral edema MSkel - R foot in Darco show - wounds not examined, [facility]     Assessment & Plan:  Acute sinusitis Pharyngitis  DM2 - reports stable cbgs - ongoing wound care for R foot wounds reviewed - 2 of 3 wounds have improved sine going to [facility] Hx vaginal candidiasis - send diflucan also at pt request  Empiric antibiotics prescribed due to symptom duration greater than 7 days and co morbid dz Symptomatic care with Tylenol or Advil, hydration and rest -  salt gargle advised as needed

## 2011-08-01 NOTE — Progress Notes (Signed)
[facility]  NAME:  Beth Holden, Beth Holden              ACCOUNT NO.:  [id number]  MEDICAL RECORD NO.:  [id number]      DATE OF BIRTH:  01-11-1946  PHYSICIAN:  Wayland Denis, DO       VISIT DATE:  08/01/2011                                  OFFICE VISIT   The patient is a 66 year old female who is here for a followup on her toe diabetic foot ulcers on her right foot.  The great toe has markedly improved as well as the second with complete healing.  The third has some eschar still on the tip, but the swelling and the redness is markedly better than previously.  She has been using Silvercel on the area.  She is taking her multivitamin and elevating her foot when she is home.  PHYSICAL EXAMINATION:  GENERAL:  She is alert, oriented, cooperative, not in any acute distress.  She is pleasant. HEENT:  Pupils are equal.  Extraocular muscles intact. NECK:  No cervical lymphadenopathy. LUNGS:  Breathing is unlabored. HEART:  Regular. ABDOMEN:  Soft.  The wound is described above.  We will continue with Silvercel on the third toe and lotion on the first and second and have her follow up in 1 week.     Wayland Denis, DO     CS/MEDQ  D:  08/01/2011  T:  08/01/2011  Job:  [id number]

## 2011-08-01 NOTE — Patient Instructions (Signed)
It was good to see you today. Zpak antibiotics and Diflucan to use for yeast AFTER done with Zpak - Your prescription(s) have been submitted to your pharmacy. Please take as directed and contact our office if you believe you are having problem(s) with the medication(s). Watch your sugars and call if over 200 in next few days - infection/illness can increase your sugars more than usual for next few days! Salt Water Gargle This solution will help make your mouth and throat feel better. HOME CARE INSTRUCTIONS    Mix 1 teaspoon of salt in 8 ounces of warm water.   Gargle with this solution as much or often as you need or as directed. Swish and gargle gently if you have any sores or wounds in your mouth.   Do not swallow this mixture.  Document Released: 10/01/2003 Document Revised: 12/16/2010 Document Reviewed: 02/22/2008 [facility] Patient Information 2012 [place], [place].

## 2011-08-01 NOTE — Telephone Encounter (Signed)
Caller: Beth Holden/Patient; PCP: Illene Regulus; CB#: [phone]; ; ; Call regarding Cough/Congestion;  Onset- 08/01/11 Afebrile. Pt c/o of Sinus congestion and discomfort, sore throat and blood tinged mucus sometimes when she coughs. Emergent s/s of URI protocol r/o. Pt to see provider within 24 hrs. Appt scheduled for 10:30am with Dr. Felicity Coyer.

## 2011-08-02 ENCOUNTER — Encounter (HOSPITAL_BASED_OUTPATIENT_CLINIC_OR_DEPARTMENT_OTHER): Payer: BC Managed Care – PPO

## 2011-08-08 NOTE — Progress Notes (Signed)
[facility]  NAME:  BROOKS, STOTZ              ACCOUNT NO.:  [id number]  MEDICAL RECORD NO.:  [id number]      DATE OF BIRTH:  12-17-1945  PHYSICIAN:  Wayland Denis, DO       VISIT DATE:  08/08/2011                                  OFFICE VISIT   HISTORY OF PRESENT ILLNESS:  The patient is a 66 year old female who is here for followup on her right foot ulcers.  The great and second toe have healed well with just a little scabbed area.  The third toe has an additional blister on it, but the tip is healing.  She also has some fungus of the toenail bed.  She has been using Silvercel and that seems to be helping.  There has been no change in her medications or social history.  REVIEW OF SYSTEMS:  Otherwise negative.  PHYSICAL EXAMINATION:  GENERAL:  She is alert, oriented, cooperative, not in any acute distress.  She is pleasant.  Her pulses are present and regular. LUNGS:  Her breathing is unlabored. HEART:  Regular. ABDOMEN:  Soft.  The wounds are improving and described above.  We will continue with Silvercel on the third toe and lotion on the first and second and have her follow up in 1 week.     Wayland Denis, DO     CS/MEDQ  D:  08/08/2011  T:  08/08/2011  Job:  [id number]

## 2011-08-15 ENCOUNTER — Encounter (HOSPITAL_BASED_OUTPATIENT_CLINIC_OR_DEPARTMENT_OTHER): Payer: BC Managed Care – PPO | Attending: Plastic Surgery

## 2011-08-15 DIAGNOSIS — B351 Tinea unguium: Secondary | ICD-10-CM | POA: Insufficient documentation

## 2011-08-15 DIAGNOSIS — L97509 Non-pressure chronic ulcer of other part of unspecified foot with unspecified severity: Secondary | ICD-10-CM | POA: Insufficient documentation

## 2011-08-16 NOTE — Progress Notes (Signed)
[facility]  NAME:  Beth Holden, Beth Holden              ACCOUNT NO.:  [id number]  MEDICAL RECORD NO.:  [id number]      DATE OF BIRTH:  30-Aug-1945  PHYSICIAN:  Wayland Denis, DO       VISIT DATE:  08/15/2011                                  OFFICE VISIT   The patient is a 66 year old female, who is here for followup on her right toe ulcers.  She is doing better and has actually healed the 2nd and 3rd completely. She was using Silvercel.  There was a little bit of scab and that was debrided, and it is epithelialized underneath.  She has a new bug blister on her great toe, that we will keep an eye on.  No change in her medications or social history.  On exam, she is alert, oriented, cooperative, not in any acute distress. She is pleasant.  Pupils are equal.  Extraocular muscles are intact.  No cervical lymphadenopathy.  The wound is described as above and debrided.  We will continue with Silvercel and toe sock, and we will have her follow up in 1 week.  Also recommend for her to see her podiatrist for proper shoe fitting.     Wayland Denis, DO     CS/MEDQ  D:  08/15/2011  T:  08/16/2011  Job:  [id number]

## 2011-08-23 NOTE — Progress Notes (Signed)
[facility]  NAME:  Beth Holden, Beth Holden              ACCOUNT NO.:  [id number]  MEDICAL RECORD NO.:  [id number]      DATE OF BIRTH:  04/09/1945  PHYSICIAN:  Wayland Denis, DO       VISIT DATE:  08/22/2011                                  OFFICE VISIT   The patient is a 66 year old female, who is here for followup on her right lower extremity ulcers.  Her second, third, and fourth toe were doing excellent.  Her first toe had a bug blister, so we were able to debride that with improvement in the overall appearance and no sign of infection at the moment.  We will continue with Silver Cel, elevation, multivitamin, and have the patient follow up in a week.     Wayland Denis, DO     CS/MEDQ  D:  08/22/2011  T:  08/23/2011  Job:  [id number]

## 2011-09-06 NOTE — Progress Notes (Signed)
[facility]  NAME:  SHLEY, DOLBY              ACCOUNT NO.:  [id number]  MEDICAL RECORD NO.:  [id number]      DATE OF BIRTH:  11-13-1945  PHYSICIAN:  Wayland Denis, DO       VISIT DATE:  09/05/2011                                  OFFICE VISIT   The patient is a 66 year old female who is here for followup on her right lower extremity toe ulcers.  She has been using SilverCel on the area.  It looks excellent and has complete healing at this point.  There is no sign of infection.  There is no periwound erythema and the pain has improved as well.  There has been no change in her medications or social history.  On exam, she is alert, oriented, and cooperative, not in acute distress. Pupils are equal.  Extraocular muscles are intact.  No cervical lymphadenopathy.  Her breathing is unlabored.  Her heart is regular.  We will continue with the patient taking care of the area with lotion and follow up as needed.     Wayland Denis, DO     CS/MEDQ  D:  09/05/2011  T:  09/05/2011  Job:  [id number]

## 2011-11-17 ENCOUNTER — Other Ambulatory Visit (INDEPENDENT_AMBULATORY_CARE_PROVIDER_SITE_OTHER): Payer: BC Managed Care – PPO

## 2011-11-17 ENCOUNTER — Telehealth: Payer: Self-pay | Admitting: *Deleted

## 2011-11-17 ENCOUNTER — Ambulatory Visit (INDEPENDENT_AMBULATORY_CARE_PROVIDER_SITE_OTHER)
Admission: RE | Admit: 2011-11-17 | Discharge: 2011-11-17 | Disposition: A | Discharge status: Home / Self Care | Payer: BC Managed Care – PPO | Source: Ambulatory Visit | Attending: Internal Medicine | Admitting: Internal Medicine

## 2011-11-17 ENCOUNTER — Encounter: Payer: Self-pay | Admitting: Internal Medicine

## 2011-11-17 ENCOUNTER — Telehealth: Payer: Self-pay | Admitting: Internal Medicine

## 2011-11-17 ENCOUNTER — Ambulatory Visit (INDEPENDENT_AMBULATORY_CARE_PROVIDER_SITE_OTHER): Payer: BC Managed Care – PPO | Admitting: Internal Medicine

## 2011-11-17 VITALS — BP 124/72 | HR 98 | Temp 97.7°F | Ht 66.0 in | Wt 190.1 lb

## 2011-11-17 DIAGNOSIS — L089 Local infection of the skin and subcutaneous tissue, unspecified: Secondary | ICD-10-CM

## 2011-11-17 DIAGNOSIS — E1149 Type 2 diabetes mellitus with other diabetic neurological complication: Secondary | ICD-10-CM

## 2011-11-17 DIAGNOSIS — E1169 Type 2 diabetes mellitus with other specified complication: Secondary | ICD-10-CM

## 2011-11-17 DIAGNOSIS — E11628 Type 2 diabetes mellitus with other skin complications: Secondary | ICD-10-CM

## 2011-11-17 LAB — HIGH SENSITIVITY CRP: CRP, High Sensitivity: 11.04 mg/dL — ABNORMAL HIGH (ref 0.000–5.000)

## 2011-11-17 LAB — HEMOGLOBIN A1C: Hgb A1c MFr Bld: 12.6 % — ABNORMAL HIGH (ref 4.6–6.5)

## 2011-11-17 MED ORDER — AMOXICILLIN-POT CLAVULANATE 875-125 MG PO TABS: 1.0000 | ORAL_TABLET | Freq: Two times a day (BID) | ORAL | Status: DC

## 2011-11-17 MED ORDER — FLUCONAZOLE 150 MG PO TABS: 150.0000 mg | ORAL_TABLET | Freq: Once | ORAL | Status: DC

## 2011-11-17 MED ORDER — TRAMADOL HCL 50 MG PO TABS: 50.0000 mg | ORAL_TABLET | Freq: Three times a day (TID) | ORAL | Status: DC | PRN

## 2011-11-17 NOTE — Telephone Encounter (Signed)
Patient calling about the second toe on her right foot.  It is discolored and worsening.  The toe is very sore.  Has no open areas on the toe.   [facility].  She called them this morning and they will not see her since there is no open area.   Scheduled for 1030 with Dr. Mady Haagensen.  (she is at the hospital surgical waiting area, her sister is having surgery this am).

## 2011-11-17 NOTE — Progress Notes (Signed)
  Subjective:    Patient ID: Beth Holden, female    DOB: 1945/01/26, 66 y.o.   MRN: [id number]  HPI  Here for toe pain and discoloration  Past Medical History  Diagnosis Date  . Rhinitis, allergic   . GERD (gastroesophageal reflux disease)   . Erosive esophagitis   . Peripheral neuropathy   . Hypertension   . IBS (irritable bowel syndrome)   . Hyperlipemia   . Diabetes mellitus type II   . Acute appendicitis 01/05/2011  . ALLERGIC RHINITIS   . ALOPECIA NEC   . DIABETES MELLITUS, TYPE II     Review of Systems  Constitutional: Negative for fever and fatigue.  Respiratory: Negative for cough and shortness of breath.   Skin: Positive for color change.       Objective:   Physical Exam BP 124/72  Pulse 98  Temp 97.7 F (36.5 C) (Oral)  Ht 5\' 6"  (1.676 m)  Wt 190 lb 1.9 oz (86.238 kg)  BMI 30.69 kg/m2  SpO2 97% Wt Readings from Last 3 Encounters:  11/17/11 190 lb 1.9 oz (86.238 kg)  08/01/11 191 lb 1.9 oz (86.691 kg)  06/21/11 184 lb (83.462 kg)   Constitutional: She appears well-developed and well-nourished. No distress.  Cardiovascular: Normal rate, regular rhythm and normal heart sounds.  No murmur heard. No BLE edema. Pulmonary/Chest: Effort normal and breath sounds normal. No respiratory distress. She has no wheezes.  Musculoskeletal:Hammertoe deformities - Normal range of motion, no joint effusions. Skin: No erythema or swelling. R 2nd toe with dark discoloration distally involving toenail and pad of toe - no open wounds or drainage at this time Psychiatric: She has a normal mood and affect. Her behavior is normal. Judgment and thought content normal.   Lab Results  Component Value Date   WBC 10.7* 01/06/2011   HGB 12.1 01/06/2011   HCT 36.2 01/06/2011   PLT 344 01/06/2011   GLUCOSE 305* 01/06/2011   CHOL 146 02/04/2010   TRIG 85.0 02/04/2010   HDL 44.90 02/04/2010   LDLCALC 84 02/04/2010   ALT 15 01/06/2011   AST 23 01/06/2011   NA 136 01/06/2011   K  4.3 01/06/2011   CL 102 01/06/2011   CREATININE 0.50 01/06/2011   BUN 12 01/06/2011   CO2 25 01/06/2011   TSH 1.81 10/26/2007   INR 1.01 01/05/2011   HGBA1C 11.3* 01/05/2011      Assessment & Plan:   Right second toe distal discoloration with pain  -resolution of prior open diabetic wound (06/2011 OV reviewed) through care of Wound Clinic X-ray and sedimentation rate/CRP now to evaluate for possible osteomyelitis Begin empiric Augmentin and use tramadol for pain as needed Consider referral back to podiatry if no evidence of osteo -  follow up PCP in 2 weeks to revaluate same, sooner if problems

## 2011-11-17 NOTE — Telephone Encounter (Signed)
Inform pt with labs results & md recommendations. Pt states that Dr. Debby Bud told her should should start ting the levemir twice a day if she had to increase more than 60 units because that would be too much for her to take in at one time. How much she need to take twice a day...Beth Holden

## 2011-11-17 NOTE — Telephone Encounter (Signed)
Ok to take levemir 38U BID

## 2011-11-17 NOTE — Assessment & Plan Note (Signed)
Uncontrolled diabetes with advanced peripheral neuropathy Wound care issues since summer 2013 reviewed Continue gabapentin and add tramadol for pain Also check a1c now and titrate insulin as needed   Lab Results  Component Value Date   HGBA1C 11.3* 01/05/2011

## 2011-11-17 NOTE — Patient Instructions (Signed)
It was good to see you today. Test(s) ordered today. Your results will be released to MyChart (or called to you) after review, usually within 72hours after test completion. If any changes need to be made, you will be notified at that same time. Augmentin antibiotics 2x/day x 10days and tramadol for pain - Your prescription(s) have been submitted to your pharmacy. Please take as directed and contact our office if you believe you are having problem(s) with the medication(s).

## 2011-11-18 NOTE — Telephone Encounter (Signed)
Notified pt with md response.../lmb 

## 2011-11-24 ENCOUNTER — Telehealth: Payer: Self-pay

## 2011-11-24 MED ORDER — AMOXICILLIN-POT CLAVULANATE 875-125 MG PO TABS: 1.0000 | ORAL_TABLET | Freq: Two times a day (BID) | ORAL | Status: DC

## 2011-11-24 NOTE — Telephone Encounter (Signed)
Called pt no answer LMOM rx sent to [facility].../lmb 

## 2011-11-24 NOTE — Telephone Encounter (Signed)
Ok thanks 

## 2011-11-24 NOTE — Telephone Encounter (Signed)
Pt called stating that she accidentally washed her bottle of antibiotics and is now out of medication about half way through treatment. Pt is requesting Rx for #10 tablets to her local pharmacy, okay to send?

## 2011-12-02 ENCOUNTER — Other Ambulatory Visit: Payer: Self-pay | Admitting: Internal Medicine

## 2011-12-02 DIAGNOSIS — Z1231 Encounter for screening mammogram for malignant neoplasm of breast: Secondary | ICD-10-CM

## 2011-12-05 ENCOUNTER — Ambulatory Visit (INDEPENDENT_AMBULATORY_CARE_PROVIDER_SITE_OTHER): Payer: BC Managed Care – PPO | Admitting: Internal Medicine

## 2011-12-05 ENCOUNTER — Other Ambulatory Visit: Payer: Self-pay | Admitting: *Deleted

## 2011-12-05 ENCOUNTER — Ambulatory Visit: Payer: BC Managed Care – PPO | Admitting: Internal Medicine

## 2011-12-05 ENCOUNTER — Encounter: Payer: Self-pay | Admitting: Internal Medicine

## 2011-12-05 VITALS — BP 128/78 | HR 101 | Temp 99.1°F | Resp 12

## 2011-12-05 DIAGNOSIS — E11621 Type 2 diabetes mellitus with foot ulcer: Secondary | ICD-10-CM

## 2011-12-05 DIAGNOSIS — E1169 Type 2 diabetes mellitus with other specified complication: Secondary | ICD-10-CM

## 2011-12-05 DIAGNOSIS — L97509 Non-pressure chronic ulcer of other part of unspecified foot with unspecified severity: Secondary | ICD-10-CM

## 2011-12-05 MED ORDER — INSULIN PEN NEEDLE 31G X 8 MM MISC: 76.0000 [IU] | Freq: Every day | Status: DC

## 2011-12-05 MED ORDER — INSULIN DETEMIR 100 UNIT/ML ~~LOC~~ SOLN: 100.0000 [IU] | Freq: Every day | SUBCUTANEOUS | Status: DC

## 2011-12-05 NOTE — Progress Notes (Signed)
Patient ID: Beth Holden, female   DOB: 07/06/1945, 66 y.o.   MRN: [id number]  HPI Beth Holden is a 66 yo lady with uncontrolled DM type 2 who presents for follow-up of a diabetic foot infection. Her second toe had an ulcer at the tip that was treated by Dr. Zandra Abts for podiatry for several weeks. Treatment involved debridement of callus and eschar. With failure to heal she was referred to the wound center for treatment. She was seen on 2.5 weeks ago (11/17/11) for right second toe distal discoloration with pain. She was started on Augmentin at that time and reports that she completed the 10 day course. She reports that the ulcer has not healed. She notes no improvement with the antibiotic. She called the wound center but they said they will not see patients without an open wound.  Patient reports that she's been doing better with her diabetes control. She has started eating just a light dinner and says that her sugars do much better. She checks her blood sugar once a day, in the morning. Lab Results  Component Value Date   HGBA1C 12.6* 11/17/2011     ROS -Denies fever, chills, malaise  Past Medical History  Diagnosis Date  . Rhinitis, allergic   . GERD (gastroesophageal reflux disease)   . Erosive esophagitis   . Peripheral neuropathy   . Hypertension   . IBS (irritable bowel syndrome)   . Hyperlipemia   . Diabetes mellitus type II   . Acute appendicitis 01/05/2011  . ALLERGIC RHINITIS   . ALOPECIA NEC   . DIABETES MELLITUS, TYPE II    Past Surgical History  Procedure Date  . Vaginal hysterectomy 2001  . Oophorectomy 2001  . Tubal ligation   . Rotator cuff repair     left  . Laparoscopic appendectomy 01/05/2011    Procedure: APPENDECTOMY LAPAROSCOPIC;  Surgeon: Valarie Merino, MD;  Location: [facility];  Service: General;  Laterality: N/A;   Family History  Problem Relation Age of Onset  . Lung cancer Mother 42  . Cancer Mother     lung  . Hypertension Father   .  Hyperlipidemia Father   . Diabetes Father   . Coronary artery disease Father   . Dementia Father   . Stomach cancer Paternal Aunt   . Cancer Paternal Aunt     stomach  . Brain cancer Paternal Uncle   . Cancer Paternal Uncle     brain  . Colon cancer Neg Hx   . Irritable bowel syndrome      Several family members on fathers side   . Diabetes Other   . Cancer Maternal Aunt     stomach   History   Social History  . Marital Status: Married    Spouse Name: N/A    Number of Children: 2  . Years of Education: N/A   Occupational History  . Retired     Social History Main Topics  . Smoking status: Never Smoker   . Smokeless tobacco: Never Used  . Alcohol Use: No  . Drug Use: No  . Sexually Active: Yes    Birth Control/ Protection: Post-menopausal   Other Topics Concern  . Not on file   Social History Narrative   Caffeine daily [employer], [employer] no diploma. Married '66. 1 dtr- '78; 1 son '71; 2 grandchildren. 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 Dad with alzheimers-had to place in [place] (summer '10)   Current Outpatient Prescriptions on  File Prior to Visit  Medication Sig Dispense Refill  . acetaminophen (TYLENOL) 500 MG tablet Take 500 mg by mouth as needed. pain      . amitriptyline (ELAVIL) 25 MG tablet Take 1 tablet (25 mg total) by mouth at bedtime.  90 tablet  3  . cetirizine (ZYRTEC) 10 MG tablet Take 10 mg by mouth daily.       . enalapril (VASOTEC) 20 MG tablet Take 20 mg by mouth daily.  90 tablet  3  . esomeprazole (NEXIUM) 40 MG capsule Take 40 mg by mouth 2 (two) times daily.  180 capsule  3  . Fluocinolone Acetonide (DERMOTIC) 0.01 % OIL Place in ear(s) as directed.       . gabapentin (NEURONTIN) 300 MG capsule Take 1 capsule (300 mg total) by mouth daily.  90 capsule  3  . insulin detemir (LEVEMIR) 100 UNIT/ML injection Inject 38 Units into the skin 2 (two) times daily. Re-titrate the levemir: check fasting AM blood sugar. If it is 150+  three days in a row increase the levemir by 3 units.  10 mL    . Insulin Pen Needle 31G X 8 MM MISC Inject 58 Units into the skin daily.      . metFORMIN (GLUCOPHAGE) 1000 MG tablet Take 1 tablet (1,000 mg total) by mouth 2 (two) times daily with a meal.  180 tablet  3  . Multiple Vitamins-Minerals (CENTRUM SILVER) tablet Take 1 tablet by mouth daily.      . rosuvastatin (CRESTOR) 5 MG tablet TAKE 1 TABLET AT BEDTIME  90 tablet  3  . traMADol (ULTRAM) 50 MG tablet Take 1 tablet (50 mg total) by mouth every 8 (eight) hours as needed for pain.  30 tablet  0  . fluconazole (DIFLUCAN) 150 MG tablet Take 1 tablet (150 mg total) by mouth once. May repeat dose next day if needed  2 tablet  0   PE General: Pleasant lady in NAD  CV: Regular rate and rhythm, S1 and S2 present Pulm: Lungs clear to auscultation bilaterally Extremities: Tip of second right toe: clean-appearing, circular, callused ulcer with bruising around edges Neuro: Alert and appropriate.  A/P  Beth Holden is a 66 yo lady with uncontrolled DM type 2 who presents for follow-up of a diabetic foot infection.  # Foot Ulcer - non-healing ulcer in a diabetic patient. -Referral to Dr. Aldean Baker at [facility], appt on Monday, December 2 at 4:15pm   Patient interviewed and examined. I agree with the assessment and plan per Ms. Mattea Seger, MS III.

## 2011-12-05 NOTE — Patient Instructions (Addendum)
Thank you for coming to see [place]. We are concerned about your nonhealing toe wound.   You have an appointment with:  Dr. Aldean Baker, [facility] [address] [place], [place], [place] [address]   Monday, December 2 @ 4:15pm (arrive at 4:00pm)

## 2011-12-09 ENCOUNTER — Other Ambulatory Visit: Payer: Self-pay

## 2011-12-09 MED ORDER — INSULIN DETEMIR 100 UNIT/ML ~~LOC~~ SOLN: 38.0000 [IU] | Freq: Two times a day (BID) | SUBCUTANEOUS | Status: DC

## 2011-12-09 MED ORDER — INSULIN PEN NEEDLE 31G X 8 MM MISC: 1.0000 | Freq: Two times a day (BID) | Status: DC

## 2011-12-16 ENCOUNTER — Other Ambulatory Visit (HOSPITAL_COMMUNITY): Payer: Self-pay | Admitting: Orthopedic Surgery

## 2011-12-21 ENCOUNTER — Encounter (HOSPITAL_COMMUNITY): Payer: Self-pay | Admitting: Respiratory Therapy

## 2011-12-26 ENCOUNTER — Encounter (HOSPITAL_COMMUNITY): Payer: Self-pay

## 2011-12-26 ENCOUNTER — Encounter (HOSPITAL_COMMUNITY)
Admission: RE | Admit: 2011-12-26 | Discharge: 2011-12-26 | Disposition: A | Discharge status: Home / Self Care | Payer: BC Managed Care – PPO | Source: Ambulatory Visit | Attending: Anesthesiology | Admitting: Anesthesiology

## 2011-12-26 ENCOUNTER — Encounter (HOSPITAL_COMMUNITY)
Admission: RE | Admit: 2011-12-26 | Discharge: 2011-12-26 | Disposition: A | Discharge status: Home / Self Care | Payer: BC Managed Care – PPO | Source: Ambulatory Visit | Attending: Orthopedic Surgery | Admitting: Orthopedic Surgery

## 2011-12-26 HISTORY — DX: Other specified postprocedural states: Z98.890

## 2011-12-26 HISTORY — DX: Pain in unspecified joint: M25.50

## 2011-12-26 HISTORY — DX: Other seasonal allergic rhinitis: J30.2

## 2011-12-26 HISTORY — DX: Personal history of colonic polyps: Z86.010

## 2011-12-26 HISTORY — DX: Headache: R51

## 2011-12-26 HISTORY — DX: Nausea with vomiting, unspecified: R11.2

## 2011-12-26 HISTORY — DX: Osteomyelitis, unspecified: M86.9

## 2011-12-26 HISTORY — DX: Personal history of colon polyps, unspecified: Z86.0100

## 2011-12-26 LAB — COMPREHENSIVE METABOLIC PANEL
ALT: 19 U/L (ref 0–35)
AST: 28 U/L (ref 0–37)
Albumin: 3.9 g/dL (ref 3.5–5.2)
Alkaline Phosphatase: 53 U/L (ref 39–117)
BUN: 18 mg/dL (ref 6–23)
CO2: 29 mEq/L (ref 19–32)
Calcium: 9.7 mg/dL (ref 8.4–10.5)
Chloride: 100 mEq/L (ref 96–112)
Creatinine, Ser: 0.67 mg/dL (ref 0.50–1.10)
GFR calc Af Amer: >90 mL/min (ref >90)
GFR calc non Af Amer: >90 mL/min (ref >90)
Glucose, Bld: 217 mg/dL — ABNORMAL HIGH (ref 70–99)
Potassium: 3.8 mEq/L (ref 3.5–5.1)
Sodium: 140 mEq/L (ref 135–145)
Total Bilirubin: 0.1 mg/dL — ABNORMAL LOW (ref 0.3–1.2)
Total Protein: 7.5 g/dL (ref 6.0–8.3)

## 2011-12-26 LAB — CBC
HCT: 36.5 % (ref 36.0–46.0)
Hemoglobin: 11.8 g/dL — ABNORMAL LOW (ref 12.0–15.0)
MCH: 26.6 pg (ref 26.0–34.0)
MCHC: 32.3 g/dL (ref 30.0–36.0)
MCV: 82.4 fL (ref 78.0–100.0)
Platelets: 422 10*3/uL — ABNORMAL HIGH (ref 150–400)
RBC: 4.43 MIL/uL (ref 3.87–5.11)
RDW: 13.5 % (ref 11.5–15.5)
WBC: 10.4 10*3/uL (ref 4.0–10.5)

## 2011-12-26 LAB — PROTIME-INR
INR: 1.06 (ref 0.00–1.49)
Prothrombin Time: 13.7 seconds (ref 11.6–15.2)

## 2011-12-26 LAB — APTT: aPTT: 31 seconds (ref 24–37)

## 2011-12-26 LAB — SURGICAL PCR SCREEN
MRSA, PCR: NEGATIVE
Staphylococcus aureus: NEGATIVE

## 2011-12-26 NOTE — Progress Notes (Signed)
Pt doesn't have a cardiologist  Echo/Stress test done >15yrs ago;states everything was normal Denies ever having a heart cath  Dr.Michael Norins is Medical MD   EKG in epic from 01/05/11 Denies cxr in past yr

## 2011-12-26 NOTE — Progress Notes (Signed)
Average fasting blood sugar runs 137

## 2011-12-26 NOTE — Pre-Procedure Instructions (Signed)
20 Beth Holden  12/26/2011   Your procedure is scheduled on:  Wed, Dec 18 @ 8:30 AM  Report to [facility] at 6:30 AM.  Call this number if you have problems the morning of surgery: [phone]   Remember:   Do not eat food:After Midnight.    Take these medicines the morning of surgery with A SIP OF WATER: Zyrtec(Cetirizine),Esomeprazole(Nexium),Doxycycline(Vibra), and Gabapentin(Neurontin)   Do not wear jewelry, make-up or nail polish.  Do not wear lotions, powders, or perfumes. You may wear deodorant.  Do not shave 48 hours prior to surgery.   Do not bring valuables to the hospital.  Contacts, dentures or bridgework may not be worn into surgery.  Leave suitcase in the car. After surgery it may be brought to your room.  For patients admitted to the hospital, checkout time is 11:00 AM the day of discharge.   Patients discharged the day of surgery will not be allowed to drive home.    Special Instructions: Shower using CHG 2 nights before surgery and the night before surgery.  If you shower the day of surgery use CHG.  Use special wash - you have one bottle of CHG for all showers.  You should use approximately 1/3 of the bottle for each shower.   Please read over the following fact sheets that you were given: Pain Booklet, Coughing and Deep Breathing, MRSA Information and Surgical Site Infection Prevention

## 2011-12-27 MED ORDER — CEFAZOLIN SODIUM-DEXTROSE 2-3 GM-% IV SOLR
2.0000 g | INTRAVENOUS | Status: AC
  Administered 2011-12-28: 2 g via INTRAVENOUS
  Filled 2011-12-27 (×2): qty 50

## 2011-12-27 NOTE — Consult Note (Signed)
Anesthesia chart review: Patient is a 66 year old female scheduled for right foot second toe amputation at metatarophalangeal joint on 12/28/11 by Dr. Lajoyce Corners. History includes non-smoker, postoperative nausea vomiting, GERD, diabetes mellitus type 2, hypertension, hyperlipidemia, headaches, IBS, wrists of esophagitis. She is status post appendectomy in December 2012.  PCP is Dr. Illene Regulus.  Preoperative labs noted.  Chest x-ray on 12/26/2011 showed no acute cardiopulmonary findings. Mild chronic bronchitic-type lung changes.  EKG on 01/05/11 showed ST @ 109, consider anterior infarct (age undetermined).  Currently there are no comparison EKGs.  This was her pre-operative EKG for her appendectomy last year.  No CV symptoms were documented at her PAT visit.  If she remains asymptomatic then anticipate she can proceed as planned.  Shonna Chock, [place] 12/1711 [phone]

## 2011-12-28 ENCOUNTER — Encounter (HOSPITAL_COMMUNITY): Payer: Self-pay | Admitting: Vascular Surgery

## 2011-12-28 ENCOUNTER — Encounter (HOSPITAL_COMMUNITY): Payer: Self-pay | Admitting: General Practice

## 2011-12-28 ENCOUNTER — Ambulatory Visit (HOSPITAL_COMMUNITY): Payer: BC Managed Care – PPO | Admitting: Vascular Surgery

## 2011-12-28 ENCOUNTER — Ambulatory Visit (HOSPITAL_COMMUNITY)
Admission: RE | Admit: 2011-12-28 | Discharge: 2011-12-29 | Disposition: A | Discharge status: Home / Self Care | Payer: BC Managed Care – PPO | Source: Ambulatory Visit | Attending: Orthopedic Surgery | Admitting: Orthopedic Surgery

## 2011-12-28 ENCOUNTER — Encounter (HOSPITAL_COMMUNITY): Payer: Self-pay | Admitting: *Deleted

## 2011-12-28 ENCOUNTER — Encounter (HOSPITAL_COMMUNITY)
Admission: RE | Disposition: A | Discharge status: Home / Self Care | Payer: Self-pay | Source: Ambulatory Visit | Attending: Orthopedic Surgery

## 2011-12-28 DIAGNOSIS — M86679 Other chronic osteomyelitis, unspecified ankle and foot: Secondary | ICD-10-CM | POA: Insufficient documentation

## 2011-12-28 DIAGNOSIS — E1149 Type 2 diabetes mellitus with other diabetic neurological complication: Secondary | ICD-10-CM | POA: Insufficient documentation

## 2011-12-28 DIAGNOSIS — I10 Essential (primary) hypertension: Secondary | ICD-10-CM | POA: Insufficient documentation

## 2011-12-28 DIAGNOSIS — Z7902 Long term (current) use of antithrombotics/antiplatelets: Secondary | ICD-10-CM | POA: Insufficient documentation

## 2011-12-28 DIAGNOSIS — Z794 Long term (current) use of insulin: Secondary | ICD-10-CM | POA: Insufficient documentation

## 2011-12-28 DIAGNOSIS — E1169 Type 2 diabetes mellitus with other specified complication: Secondary | ICD-10-CM | POA: Insufficient documentation

## 2011-12-28 DIAGNOSIS — Z01812 Encounter for preprocedural laboratory examination: Secondary | ICD-10-CM | POA: Insufficient documentation

## 2011-12-28 DIAGNOSIS — K219 Gastro-esophageal reflux disease without esophagitis: Secondary | ICD-10-CM | POA: Insufficient documentation

## 2011-12-28 DIAGNOSIS — Z01818 Encounter for other preprocedural examination: Secondary | ICD-10-CM | POA: Insufficient documentation

## 2011-12-28 DIAGNOSIS — M869 Osteomyelitis, unspecified: Secondary | ICD-10-CM

## 2011-12-28 DIAGNOSIS — M9080 Osteopathy in diseases classified elsewhere, unspecified site: Secondary | ICD-10-CM | POA: Insufficient documentation

## 2011-12-28 DIAGNOSIS — Z79899 Other long term (current) drug therapy: Secondary | ICD-10-CM | POA: Insufficient documentation

## 2011-12-28 DIAGNOSIS — E1142 Type 2 diabetes mellitus with diabetic polyneuropathy: Secondary | ICD-10-CM | POA: Insufficient documentation

## 2011-12-28 DIAGNOSIS — E785 Hyperlipidemia, unspecified: Secondary | ICD-10-CM | POA: Insufficient documentation

## 2011-12-28 HISTORY — PX: AMPUTATION: SHX166

## 2011-12-28 LAB — GLUCOSE, CAPILLARY
Glucose-Capillary: 119 mg/dL — ABNORMAL HIGH (ref 70–99)
Glucose-Capillary: 135 mg/dL — ABNORMAL HIGH (ref 70–99)
Glucose-Capillary: 149 mg/dL — ABNORMAL HIGH (ref 70–99)
Glucose-Capillary: 298 mg/dL — ABNORMAL HIGH (ref 70–99)
Glucose-Capillary: 284 mg/dL — ABNORMAL HIGH (ref 70–99)

## 2011-12-28 SURGERY — AMPUTATION DIGIT
Anesthesia: General | Site: Foot | Laterality: Right | Wound class: Dirty or Infected

## 2011-12-28 MED ORDER — LACTATED RINGERS IV SOLN
INTRAVENOUS | Status: DC | PRN
  Administered 2011-12-28: 09:00:00 via INTRAVENOUS

## 2011-12-28 MED ORDER — PROMETHAZINE HCL 25 MG/ML IJ SOLN: 6.2500 mg | INTRAMUSCULAR | Status: DC | PRN

## 2011-12-28 MED ORDER — PANTOPRAZOLE SODIUM 40 MG PO TBEC
40.0000 mg | DELAYED_RELEASE_TABLET | Freq: Every day | ORAL | Status: DC
  Administered 2011-12-28: 40 mg via ORAL
  Filled 2011-12-28: qty 1

## 2011-12-28 MED ORDER — 0.9 % SODIUM CHLORIDE (POUR BTL) OPTIME
TOPICAL | Status: DC | PRN
  Administered 2011-12-28: 1000 mL

## 2011-12-28 MED ORDER — DEXAMETHASONE SODIUM PHOSPHATE 10 MG/ML IJ SOLN
INTRAMUSCULAR | Status: DC | PRN
  Administered 2011-12-28: 8 mg via INTRAVENOUS

## 2011-12-28 MED ORDER — METOCLOPRAMIDE HCL 5 MG/ML IJ SOLN: 5.0000 mg | Freq: Three times a day (TID) | INTRAMUSCULAR | Status: DC | PRN

## 2011-12-28 MED ORDER — HYDROCODONE-ACETAMINOPHEN 5-325 MG PO TABS
1.0000 | ORAL_TABLET | ORAL | Status: DC | PRN
  Administered 2011-12-28 (×2): 2 via ORAL
  Filled 2011-12-28 (×2): qty 2

## 2011-12-28 MED ORDER — INSULIN DETEMIR 100 UNIT/ML ~~LOC~~ SOLN
38.0000 [IU] | Freq: Two times a day (BID) | SUBCUTANEOUS | Status: DC
  Administered 2011-12-28 – 2011-12-29 (×3): 38 [IU] via SUBCUTANEOUS
  Filled 2011-12-28: qty 10

## 2011-12-28 MED ORDER — LIDOCAINE HCL (CARDIAC) 20 MG/ML IV SOLN
INTRAVENOUS | Status: DC | PRN
  Administered 2011-12-28: 50 mg via INTRAVENOUS

## 2011-12-28 MED ORDER — INSULIN ASPART 100 UNIT/ML ~~LOC~~ SOLN
4.0000 [IU] | Freq: Three times a day (TID) | SUBCUTANEOUS | Status: DC
  Administered 2011-12-28 – 2011-12-29 (×3): 4 [IU] via SUBCUTANEOUS

## 2011-12-28 MED ORDER — FENTANYL CITRATE 0.05 MG/ML IJ SOLN
INTRAMUSCULAR | Status: DC | PRN
  Administered 2011-12-28: 50 ug via INTRAVENOUS

## 2011-12-28 MED ORDER — SODIUM CHLORIDE 0.9 % IV SOLN
INTRAVENOUS | Status: DC
  Administered 2011-12-28: 15:00:00 via INTRAVENOUS

## 2011-12-28 MED ORDER — METFORMIN HCL 500 MG PO TABS
1000.0000 mg | ORAL_TABLET | Freq: Two times a day (BID) | ORAL | Status: DC
  Administered 2011-12-28: 1000 mg via ORAL
  Filled 2011-12-28 (×4): qty 2

## 2011-12-28 MED ORDER — ONDANSETRON HCL 4 MG/2ML IJ SOLN
INTRAMUSCULAR | Status: DC | PRN
  Administered 2011-12-28: 4 mg via INTRAVENOUS

## 2011-12-28 MED ORDER — MEPERIDINE HCL 25 MG/ML IJ SOLN: 6.2500 mg | INTRAMUSCULAR | Status: DC | PRN

## 2011-12-28 MED ORDER — LORATADINE 10 MG PO TABS
10.0000 mg | ORAL_TABLET | Freq: Every day | ORAL | Status: DC
  Administered 2011-12-28: 10 mg via ORAL
  Filled 2011-12-28 (×2): qty 1

## 2011-12-28 MED ORDER — ONDANSETRON HCL 4 MG PO TABS
4.0000 mg | ORAL_TABLET | Freq: Four times a day (QID) | ORAL | Status: DC | PRN
  Administered 2011-12-28: 4 mg via ORAL
  Filled 2011-12-28: qty 1

## 2011-12-28 MED ORDER — ENALAPRIL MALEATE 20 MG PO TABS
20.0000 mg | ORAL_TABLET | Freq: Every day | ORAL | Status: DC
  Administered 2011-12-28: 20 mg via ORAL
  Filled 2011-12-28 (×2): qty 1

## 2011-12-28 MED ORDER — FENTANYL CITRATE 0.05 MG/ML IJ SOLN: 25.0000 ug | INTRAMUSCULAR | Status: DC | PRN

## 2011-12-28 MED ORDER — GABAPENTIN 300 MG PO CAPS
300.0000 mg | ORAL_CAPSULE | Freq: Every day | ORAL | Status: DC
  Administered 2011-12-28: 300 mg via ORAL
  Filled 2011-12-28 (×2): qty 1

## 2011-12-28 MED ORDER — MIDAZOLAM HCL 5 MG/5ML IJ SOLN
INTRAMUSCULAR | Status: DC | PRN
  Administered 2011-12-28: 2 mg via INTRAVENOUS

## 2011-12-28 MED ORDER — CEFAZOLIN SODIUM 1-5 GM-% IV SOLN
1.0000 g | Freq: Four times a day (QID) | INTRAVENOUS | Status: AC
  Administered 2011-12-28 – 2011-12-29 (×3): 1 g via INTRAVENOUS
  Filled 2011-12-28 (×4): qty 50

## 2011-12-28 MED ORDER — MIDAZOLAM HCL 2 MG/2ML IJ SOLN: 0.5000 mg | Freq: Once | INTRAMUSCULAR | Status: DC | PRN

## 2011-12-28 MED ORDER — ATORVASTATIN CALCIUM 10 MG PO TABS
10.0000 mg | ORAL_TABLET | Freq: Every day | ORAL | Status: DC
  Administered 2011-12-28: 10 mg via ORAL
  Filled 2011-12-28 (×2): qty 1

## 2011-12-28 MED ORDER — INSULIN ASPART 100 UNIT/ML ~~LOC~~ SOLN
0.0000 [IU] | Freq: Three times a day (TID) | SUBCUTANEOUS | Status: DC
  Administered 2011-12-28: 2 [IU] via SUBCUTANEOUS
  Administered 2011-12-28: 8 [IU] via SUBCUTANEOUS
  Administered 2011-12-29: 3 [IU] via SUBCUTANEOUS

## 2011-12-28 MED ORDER — ONDANSETRON HCL 4 MG/2ML IJ SOLN: 4.0000 mg | Freq: Four times a day (QID) | INTRAMUSCULAR | Status: DC | PRN

## 2011-12-28 MED ORDER — OXYCODONE-ACETAMINOPHEN 5-325 MG PO TABS
1.0000 | ORAL_TABLET | ORAL | Status: DC | PRN
  Administered 2011-12-28: 2 via ORAL
  Filled 2011-12-28: qty 2

## 2011-12-28 MED ORDER — HYDROMORPHONE HCL PF 1 MG/ML IJ SOLN: 0.5000 mg | INTRAMUSCULAR | Status: DC | PRN

## 2011-12-28 MED ORDER — METOCLOPRAMIDE HCL 10 MG PO TABS: 5.0000 mg | ORAL_TABLET | Freq: Three times a day (TID) | ORAL | Status: DC | PRN

## 2011-12-28 MED ORDER — AMITRIPTYLINE HCL 25 MG PO TABS
25.0000 mg | ORAL_TABLET | Freq: Every day | ORAL | Status: DC
  Administered 2011-12-28: 25 mg via ORAL
  Filled 2011-12-28 (×2): qty 1

## 2011-12-28 MED ORDER — PROPOFOL 10 MG/ML IV BOLUS
INTRAVENOUS | Status: DC | PRN
  Administered 2011-12-28: 180 mg via INTRAVENOUS

## 2011-12-28 SURGICAL SUPPLY — 46 items
BANDAGE GAUZE 4  KLING STR (GAUZE/BANDAGES/DRESSINGS) IMPLANT
BLADE AVERAGE 25X9 (BLADE) IMPLANT
BLADE MINI RND TIP GREEN BEAV (BLADE) IMPLANT
BNDG COHESIVE 1X5 TAN STRL LF (GAUZE/BANDAGES/DRESSINGS) IMPLANT
BNDG COHESIVE 6X5 TAN STRL LF (GAUZE/BANDAGES/DRESSINGS) IMPLANT
BNDG ESMARK 4X9 LF (GAUZE/BANDAGES/DRESSINGS) ×2 IMPLANT
BNDG GAUZE STRTCH 6 (GAUZE/BANDAGES/DRESSINGS) IMPLANT
CLOTH BEACON ORANGE TIMEOUT ST (SAFETY) ×2 IMPLANT
CORDS BIPOLAR (ELECTRODE) ×2 IMPLANT
COVER SURGICAL LIGHT HANDLE (MISCELLANEOUS) ×2 IMPLANT
CUFF TOURNIQUET SINGLE 18IN (TOURNIQUET CUFF) IMPLANT
CUFF TOURNIQUET SINGLE 24IN (TOURNIQUET CUFF) IMPLANT
CUFF TOURNIQUET SINGLE 34IN LL (TOURNIQUET CUFF) IMPLANT
CUFF TOURNIQUET SINGLE 44IN (TOURNIQUET CUFF) IMPLANT
DRAPE U-SHAPE 47X51 STRL (DRAPES) ×2 IMPLANT
DRSG ADAPTIC 3X8 NADH LF (GAUZE/BANDAGES/DRESSINGS) IMPLANT
DURAPREP 26ML APPLICATOR (WOUND CARE) ×2 IMPLANT
ELECT REM PT RETURN 9FT ADLT (ELECTROSURGICAL) ×2
ELECTRODE REM PT RTRN 9FT ADLT (ELECTROSURGICAL) ×1 IMPLANT
GAUZE SPONGE 2X2 8PLY STRL LF (GAUZE/BANDAGES/DRESSINGS) IMPLANT
GLOVE BIOGEL PI IND STRL 9 (GLOVE) ×1 IMPLANT
GLOVE BIOGEL PI INDICATOR 9 (GLOVE) ×1
GLOVE SURG ORTHO 9.0 STRL STRW (GLOVE) ×2 IMPLANT
GOWN PREVENTION PLUS XLARGE (GOWN DISPOSABLE) ×2 IMPLANT
GOWN SRG XL XLNG 56XLVL 4 (GOWN DISPOSABLE) ×1 IMPLANT
GOWN STRL NON-REIN XL XLG LVL4 (GOWN DISPOSABLE) ×1
KIT BASIN OR (CUSTOM PROCEDURE TRAY) ×2 IMPLANT
KIT ROOM TURNOVER OR (KITS) ×2 IMPLANT
MANIFOLD NEPTUNE II (INSTRUMENTS) ×2 IMPLANT
NEEDLE HYPO 25GX1X1/2 BEV (NEEDLE) IMPLANT
NS IRRIG 1000ML POUR BTL (IV SOLUTION) ×2 IMPLANT
PACK ORTHO EXTREMITY (CUSTOM PROCEDURE TRAY) ×2 IMPLANT
PAD ARMBOARD 7.5X6 YLW CONV (MISCELLANEOUS) ×4 IMPLANT
PAD CAST 4YDX4 CTTN HI CHSV (CAST SUPPLIES) IMPLANT
PADDING CAST COTTON 4X4 STRL (CAST SUPPLIES)
SPECIMEN JAR SMALL (MISCELLANEOUS) ×2 IMPLANT
SPONGE GAUZE 2X2 STER 10/PKG (GAUZE/BANDAGES/DRESSINGS)
SPONGE GAUZE 4X4 12PLY (GAUZE/BANDAGES/DRESSINGS) IMPLANT
SUCTION FRAZIER TIP 10 FR DISP (SUCTIONS) ×2 IMPLANT
SUT ETHILON 2 0 PSLX (SUTURE) IMPLANT
SUT VIC AB 2-0 FS1 27 (SUTURE) IMPLANT
SYR CONTROL 10ML LL (SYRINGE) IMPLANT
TOWEL OR 17X24 6PK STRL BLUE (TOWEL DISPOSABLE) ×2 IMPLANT
TOWEL OR 17X26 10 PK STRL BLUE (TOWEL DISPOSABLE) ×2 IMPLANT
TUBE CONNECTING 12X1/4 (SUCTIONS) ×2 IMPLANT
WATER STERILE IRR 1000ML POUR (IV SOLUTION) ×2 IMPLANT

## 2011-12-28 NOTE — Anesthesia Preprocedure Evaluation (Signed)
Anesthesia Evaluation  Patient identified by MRN, date of birth, ID band Patient awake    Reviewed: Allergy & Precautions, H&P , Patient's Chart, lab work & pertinent test results, reviewed documented beta blocker date and time   History of Anesthesia Complications (+) PONV  Airway Mallampati: II TM Distance: >3 FB Neck ROM: full    Dental No notable dental hx.    Pulmonary neg pulmonary ROS,  breath sounds clear to auscultation  Pulmonary exam normal       Cardiovascular Exercise Tolerance: Poor hypertension, negative cardio ROS  Rhythm:regular Rate:Normal     Neuro/Psych  Headaches,  Neuromuscular disease negative neurological ROS  negative psych ROS   GI/Hepatic negative GI ROS, Neg liver ROS, PUD, GERD-  Controlled,  Endo/Other  negative endocrine ROSdiabetes  Renal/GU negative Renal ROS     Musculoskeletal   Abdominal   Peds  Hematology negative hematology ROS (+)   Anesthesia Other Findings PONV (postoperative nausea and vomiting)     Rhinitis, allergic        Erosive esophagitis     Peripheral neuropathy        IBS (irritable bowel syndrome)     Hyperlipemia   takes Crestor daily    Acute appendicitis 01/05/2011   ALLERGIC RHINITIS        ALOPECIA NEC     Hypertension   takes Enalapril daily    Seasonal allergies   takes Zyrtec daily History of bronchitis   last time a couple of yrs ago    Headache   occasionally;r/t sinus  Peripheral neuropathy        Joint pain     Osteomyelitis        GERD (gastroesophageal reflux disease)   takes Nexium bid Constipation        History of colon polyps     Diabetes mellitus type II   takes Metformin and Flexpen daily    DIABETES MELLITUS, TYPE II    Reproductive/Obstetrics negative OB ROS                           Anesthesia Physical Anesthesia Plan  ASA: III  Anesthesia Plan: General LMA   Post-op Pain Management:    Induction:    Airway Management Planned:   Additional Equipment:   Intra-op Plan:   Post-operative Plan:   Informed Consent: I have reviewed the patients History and Physical, chart, labs and discussed the procedure including the risks, benefits and alternatives for the proposed anesthesia with the patient or authorized representative who has indicated his/her understanding and acceptance.   Dental Advisory Given  Plan Discussed with: CRNA, Surgeon and Anesthesiologist  Anesthesia Plan Comments:         Anesthesia Quick Evaluation

## 2011-12-28 NOTE — Anesthesia Procedure Notes (Signed)
Procedure Name: LMA Insertion Date/Time: 12/28/2011 8:53 AM Performed by: Gwenyth Allegra Pre-anesthesia Checklist: Patient identified, Timeout performed, Emergency Drugs available, Suction available and Patient being monitored Patient Re-evaluated:Patient Re-evaluated prior to inductionOxygen Delivery Method: Circle system utilized Preoxygenation: Pre-oxygenation with 100% oxygen Intubation Type: IV induction LMA: LMA inserted LMA Size: 4.0 Number of attempts: 1 Placement Confirmation: positive ETCO2 and breath sounds checked- equal and bilateral Tube secured with: Tape Dental Injury: Teeth and Oropharynx as per pre-operative assessment

## 2011-12-28 NOTE — Progress Notes (Signed)
Orthopedic Tech Progress Note Patient Details:  Beth Holden 1945/11/02 [id number]  Ortho Devices Type of Ortho Device: Darco shoe Ortho Device/Splint Location: RIGHT DARCO SHOE Ortho Device/Splint Interventions: Application   Cammer, Mickie Bail 12/28/2011, 9:43 AM

## 2011-12-28 NOTE — H&P (Signed)
Beth Holden is an 66 y.o. female.   Chief Complaint: Osteomyelitis ulceration right foot second toe HPI: Patient is a 66 year old woman with diabetic insensate neuropathy with ulceration and osteomyelitis of the right second toe. She has failed conservative wound care and presents at this time for surgical intervention.  Past Medical History  Diagnosis Date  . Rhinitis, allergic   . Erosive esophagitis   . Peripheral neuropathy   . IBS (irritable bowel syndrome)   . Hyperlipemia     takes Crestor daily  . Acute appendicitis 01/05/2011  . ALLERGIC RHINITIS   . ALOPECIA NEC   . PONV (postoperative nausea and vomiting)   . Hypertension     takes Enalapril daily  . Seasonal allergies     takes Zyrtec daily  . History of bronchitis     last time a couple of yrs ago  . Headache     occasionally;r/t sinus   . Peripheral neuropathy   . Joint pain   . Osteomyelitis   . GERD (gastroesophageal reflux disease)     takes Nexium bid  . Constipation   . History of colon polyps   . Diabetes mellitus type II     takes Metformin and Flexpen daily  . DIABETES MELLITUS, TYPE II     Past Surgical History  Procedure Date  . Vaginal hysterectomy 2001  . Oophorectomy 2001  . Tubal ligation   . Rotator cuff repair     left  . Laparoscopic appendectomy 01/05/2011    Procedure: APPENDECTOMY LAPAROSCOPIC;  Surgeon: Valarie Merino, MD;  Location: [facility];  Service: General;  Laterality: N/A;  . Appendectomy 2012  . Colonoscopy     Family History  Problem Relation Age of Onset  . Lung cancer Mother 67  . Cancer Mother     lung  . Hypertension Father   . Hyperlipidemia Father   . Diabetes Father   . Coronary artery disease Father   . Dementia Father   . Stomach cancer Paternal Aunt   . Cancer Paternal Aunt     stomach  . Brain cancer Paternal Uncle   . Cancer Paternal Uncle     brain  . Colon cancer Neg Hx   . Irritable bowel syndrome      Several family members on fathers  side   . Diabetes Other   . Cancer Maternal Aunt     stomach   Social History:  reports that she has never smoked. She has never used smokeless tobacco. She reports that she does not drink alcohol or use illicit drugs.  Allergies:  Allergies  Allergen Reactions  . Codeine   . Propoxyphene Hcl     Medications Prior to Admission  Medication Sig Dispense Refill  . acetaminophen (TYLENOL) 500 MG tablet Take 500 mg by mouth as needed. pain      . amitriptyline (ELAVIL) 25 MG tablet Take 1 tablet (25 mg total) by mouth at bedtime.  90 tablet  3  . cetirizine (ZYRTEC) 10 MG tablet Take 10 mg by mouth daily.       Marland Kitchen doxycycline (VIBRA-TABS) 100 MG tablet Take 100 mg by mouth 2 (two) times daily.      . enalapril (VASOTEC) 20 MG tablet Take 20 mg by mouth daily.  90 tablet  3  . esomeprazole (NEXIUM) 40 MG capsule Take 40 mg by mouth 2 (two) times daily.  180 capsule  3  . Fluocinolone Acetonide (DERMOTIC) 0.01 % OIL Place in ear(s)  as directed.       . gabapentin (NEURONTIN) 300 MG capsule Take 1 capsule (300 mg total) by mouth daily.  90 capsule  3  . insulin detemir (LEVEMIR FLEXPEN) 100 UNIT/ML injection Inject 38 Units into the skin 2 (two) times daily.  69 mL  3  . metFORMIN (GLUCOPHAGE) 1000 MG tablet Take 1 tablet (1,000 mg total) by mouth 2 (two) times daily with a meal.  180 tablet  3  . Multiple Vitamins-Minerals (CENTRUM SILVER) tablet Take 1 tablet by mouth daily.      . rosuvastatin (CRESTOR) 5 MG tablet TAKE 1 TABLET AT BEDTIME  90 tablet  3    Results for orders placed during the hospital encounter of 12/26/11 (from the past 48 hour(s))  APTT     Status: Normal   Collection Time   12/26/11  3:26 PM      Component Value Range Comment   aPTT 31  24 - 37 seconds   CBC     Status: Abnormal   Collection Time   12/26/11  3:26 PM      Component Value Range Comment   WBC 10.4  4.0 - 10.5 K/uL    RBC 4.43  3.87 - 5.11 MIL/uL    Hemoglobin 11.8 (*) 12.0 - 15.0 g/dL    HCT 16.1   09.6 - 04.5 %    MCV 82.4  78.0 - 100.0 fL    MCH 26.6  26.0 - 34.0 pg    MCHC 32.3  30.0 - 36.0 g/dL    RDW 40.9  81.1 - 91.4 %    Platelets 422 (*) 150 - 400 K/uL   COMPREHENSIVE METABOLIC PANEL     Status: Abnormal   Collection Time   12/26/11  3:26 PM      Component Value Range Comment   Sodium 140  135 - 145 mEq/L    Potassium 3.8  3.5 - 5.1 mEq/L    Chloride 100  96 - 112 mEq/L    CO2 29  19 - 32 mEq/L    Glucose, Bld 217 (*) 70 - 99 mg/dL    BUN 18  6 - 23 mg/dL    Creatinine, Ser 7.82  0.50 - 1.10 mg/dL    Calcium 9.7  8.4 - 95.6 mg/dL    Total Protein 7.5  6.0 - 8.3 g/dL    Albumin 3.9  3.5 - 5.2 g/dL    AST 28  0 - 37 U/L    ALT 19  0 - 35 U/L    Alkaline Phosphatase 53  39 - 117 U/L    Total Bilirubin 0.1 (*) 0.3 - 1.2 mg/dL    GFR calc non Af Amer >90  >90 mL/min    GFR calc Af Amer >90  >90 mL/min   PROTIME-INR     Status: Normal   Collection Time   12/26/11  3:26 PM      Component Value Range Comment   Prothrombin Time 13.7  11.6 - 15.2 seconds    INR 1.06  0.00 - 1.49   SURGICAL PCR SCREEN     Status: Normal   Collection Time   12/26/11  3:26 PM      Component Value Range Comment   MRSA, PCR NEGATIVE  NEGATIVE    Staphylococcus aureus NEGATIVE  NEGATIVE    Dg Chest 2 View  12/26/2011  *RADIOLOGY REPORT*  Clinical Data: Preop for toe amputation.  CHEST - 2 VIEW  Comparison:  01/05/2011.  Findings: The heart is upper limits of normal and stable.  The mediastinal and hilar contours are normal.  Stable bronchitic type lung changes but no acute infiltrates, edema or effusions.  The bony thorax is intact.  IMPRESSION: No acute cardiopulmonary findings.  Mild chronic bronchitic type lung changes.   Original Report Authenticated By: Rudie Meyer, M.D.     Review of Systems  All other systems reviewed and are negative.    There were no vitals taken for this visit. Physical Exam  On examination patient has palpable pulses. She has ulceration swelling and  radiographic evidence of osteomyelitis of the right foot second toe. Assessment/Plan Assessment: Osteomyelitis ulceration right foot second toe.  Plan: Will plan for amputation of the right foot second toe. Risks and benefits were discussed including persistent infection nonhealing of the wound need for additional surgery. Patient states she understands and wished to proceed at this time.  Edith Lord V 12/28/2011, 6:23 AM

## 2011-12-28 NOTE — Preoperative (Signed)
Beta Blockers   Reason not to administer Beta Blockers:Not Applicable 

## 2011-12-28 NOTE — Anesthesia Postprocedure Evaluation (Signed)
Anesthesia Post Note  [name]  Procedure(s) Performed: Procedure(s) (LRB): AMPUTATION DIGIT (Right)  Anesthesia type: GA  Patient location: PACU  Post pain: Pain level controlled  Post assessment: Post-op Vital signs reviewed  Last Vitals:  Filed Vitals:   12/28/11 0930  BP:   Pulse: 88  Temp:   Resp: 20    Post vital signs: Reviewed  Level of consciousness: sedated  Complications: No apparent anesthesia complications

## 2011-12-28 NOTE — Transfer of Care (Signed)
Immediate Anesthesia Transfer of Care Note  [name]  Procedure(s) Performed: Procedure(s) (LRB) with comments: AMPUTATION DIGIT (Right) - Right Foot 2nd Toe Amputation at MTP (metatarsophalangeal joint)  Patient Location: PACU  Anesthesia Type:General  Level of Consciousness: awake and alert   Airway & Oxygen Therapy: Patient Spontanous Breathing and Patient connected to nasal cannula oxygen  Post-op Assessment: Report given to PACU RN and Post -op Vital signs reviewed and stable  Post vital signs: Reviewed and stable  Complications: No apparent anesthesia complications

## 2011-12-28 NOTE — Op Note (Signed)
OPERATIVE REPORT  DATE OF SURGERY: 12/28/2011  PATIENT:  Beth Holden,  66 y.o. female  PRE-OPERATIVE DIAGNOSIS:  Osteomyelitis Right 2nd Toe  POST-OPERATIVE DIAGNOSIS:  osteomyelitits right second toe   PROCEDURE:  Procedure(s): AMPUTATION DIGIT Amputation second ray right foot SURGEON:  Surgeon(s): Nadara Mustard, MD  ANESTHESIA:   general  EBL:  Minimal ML  SPECIMEN:  Source of Specimen:  Right foot second toe  TOURNIQUET:  * No tourniquets in log *  PROCEDURE DETAILS: Patient is a 66 year old woman with diabetic insensate neuropathy with chronic ulceration osteomyelitis of the right foot second toe do to the swelling and cellulitis ulceration and osteomyelitis after failure conservative treatment patient presents at this time for second ray amputation. Risks and benefits were discussed including infection neurovascular injury persistent pain DVT need for additional surgery. Patient states she understands was pursued this time. Description of procedure patient brought to the operating room underwent a general anesthetic. After adequate levels and anesthesia were obtained patient's right lower extremity was prepped using DuraPrep and draped into a sterile field. A V. incision was made dorsally over the second toe there was no plantar incision. The second toe was resected through the metatarsal shaft. Patient had a very prominent second metatarsal head had callus with pending ulceration and the metatarsal head was resected to even the pressure over the forefoot. Electrocautery was used for hemostasis. The incision was closed using 2-0 nylon. The wound was covered with Adaptic orthopedic sponges AB dressing Kerlix and Coban. Patient was extubated taken to the PACU in stable condition plan for overnight observation.  PLAN OF CARE: Admit for overnight observation  PATIENT DISPOSITION:  PACU - hemodynamically stable.   Nadara Mustard, MD 12/28/2011 11:09 AM

## 2011-12-29 ENCOUNTER — Encounter (HOSPITAL_COMMUNITY): Payer: Self-pay | Admitting: Orthopedic Surgery

## 2011-12-29 LAB — GLUCOSE, CAPILLARY: Glucose-Capillary: 172 mg/dL — ABNORMAL HIGH (ref 70–99)

## 2011-12-29 MED ORDER — HYDROCODONE-ACETAMINOPHEN 5-500 MG PO TABS: 1.0000 | ORAL_TABLET | Freq: Four times a day (QID) | ORAL | Status: DC | PRN

## 2011-12-29 NOTE — Discharge Summary (Signed)
Physician Discharge Summary  Patient ID: Beth Holden MRN: [id number] DOB/AGE: 64-May-1947 64 y.o.  Admit date: 12/28/2011 Discharge date: 12/29/2011  Admission Diagnoses: Osteomyelitis right foot second toe  Discharge Diagnoses: Same Active Problems:  * No active hospital problems. *    Discharged Condition: stable  Hospital Course: Patient's hospital course was essentially unremarkable. She underwent right second toe amputation. Postoperatively she was comfortable she was in a Darco shoe and was discharged to home in stable condition.  Consults: None  Significant Diagnostic Studies: labs: Routine labs  Treatments: surgery: See operative note  Discharge Exam: Blood pressure 118/67, pulse 78, temperature 97.9 F (36.6 C), temperature source Oral, resp. rate 16, SpO2 98.00%. Incision/Wound: dressing clean dry and intact  Disposition: 01-Home or Self Care  Discharge Orders    Future Orders Please Complete By Expires   Diet - low sodium heart healthy      Call MD / Call 911      Comments:   If you experience chest pain or shortness of breath, CALL 911 and be transported to the hospital emergency room.  If you develope a fever above 101 F, pus (white drainage) or increased drainage or redness at the wound, or calf pain, call your surgeon's office.   Constipation Prevention      Comments:   Drink plenty of fluids.  Prune juice may be helpful.  You may use a stool softener, such as Colace (over the counter) 100 mg twice a day.  Use MiraLax (over the counter) for constipation as needed.   Increase activity slowly as tolerated          Medication List     As of 12/29/2011  6:26 AM    TAKE these medications         acetaminophen 500 MG tablet   Commonly known as: TYLENOL   Take 500 mg by mouth as needed. pain      amitriptyline 25 MG tablet   Commonly known as: ELAVIL   Take 1 tablet (25 mg total) by mouth at bedtime.      CENTRUM SILVER tablet   Take 1 tablet by  mouth daily.      cetirizine 10 MG tablet   Commonly known as: ZYRTEC   Take 10 mg by mouth daily.      DERMOTIC 0.01 % Oil   Generic drug: Fluocinolone Acetonide   Place in ear(s) as directed.      doxycycline 100 MG tablet   Commonly known as: VIBRA-TABS   Take 100 mg by mouth 2 (two) times daily.      enalapril 20 MG tablet   Commonly known as: VASOTEC   Take 20 mg by mouth daily.      esomeprazole 40 MG capsule   Commonly known as: NEXIUM   Take 40 mg by mouth 2 (two) times daily.      gabapentin 300 MG capsule   Commonly known as: NEURONTIN   Take 1 capsule (300 mg total) by mouth daily.      HYDROcodone-acetaminophen 5-500 MG per tablet   Commonly known as: VICODIN   Take 1 tablet by mouth every 6 (six) hours as needed for pain.      insulin detemir 100 UNIT/ML injection   Commonly known as: LEVEMIR   Inject 38 Units into the skin 2 (two) times daily.      metFORMIN 1000 MG tablet   Commonly known as: GLUCOPHAGE   Take 1 tablet (1,000 mg total) by mouth  2 (two) times daily with a meal.      rosuvastatin 5 MG tablet   Commonly known as: CRESTOR   TAKE 1 TABLET AT BEDTIME           Follow-up Information    Follow up with Ronae Noell V, MD. In 1 week.   Contact information:   [address] [place] [address] [phone]          Signed: Nadara Mustard 12/29/2011, 6:26 AM

## 2011-12-30 ENCOUNTER — Ambulatory Visit: Payer: BC Managed Care – PPO

## 2012-01-05 ENCOUNTER — Telehealth: Payer: Self-pay | Admitting: Internal Medicine

## 2012-01-05 NOTE — Telephone Encounter (Signed)
Insurance is requesting her to get her Diabetic supplies from a different provider than [facility].  She needs a new prescription for (1) One touch Ultra test strips and (2) Prescription for the  Needles for Levamir Pen. She is asking for the New prescriptions to be mailed to her home.  She is currently unable to drive due to foot surgery.  Please contact and advise.  [phone]

## 2012-01-12 ENCOUNTER — Telehealth: Payer: Self-pay | Admitting: *Deleted

## 2012-01-12 NOTE — Telephone Encounter (Signed)
Please read phone note and advise:  Insurance is requesting her to get her Diabetic supplies from a different provider than [facility]. She needs a new prescription for (1) One touch Ultra test strips and (2) Prescription for the Needles for Levamir Pen. She is asking for the New prescriptions to be mailed to her home. She is currently unable to drive due to foot surgery. Please contact and advise. [phone]

## 2012-01-12 NOTE — Telephone Encounter (Signed)
OK 1. Test strips - one touch ultra 100 per month for testing jup to three times a day as insulin controlled diabetic 2. #32 or #33 gauge 8 mm pen needles 30/ month for insulin administration

## 2012-01-13 ENCOUNTER — Other Ambulatory Visit: Payer: Self-pay | Admitting: *Deleted

## 2012-01-13 MED ORDER — GLUCOSE BLOOD VI STRP: ORAL_STRIP | Status: DC

## 2012-01-13 MED ORDER — INSULIN PEN NEEDLE 31G X 8 MM MISC: 38.0000 [IU] | Freq: Two times a day (BID) | Status: DC

## 2012-01-13 NOTE — Telephone Encounter (Signed)
Pt requested new rx to be mailed to her home. She is going to use a new provider for her prescriptions. Printed.

## 2012-01-27 ENCOUNTER — Ambulatory Visit
Admission: RE | Admit: 2012-01-27 | Discharge: 2012-01-27 | Disposition: A | Discharge status: Home / Self Care | Payer: BC Managed Care – PPO | Source: Ambulatory Visit | Attending: Internal Medicine | Admitting: Internal Medicine

## 2012-01-27 DIAGNOSIS — Z1231 Encounter for screening mammogram for malignant neoplasm of breast: Secondary | ICD-10-CM

## 2012-02-15 ENCOUNTER — Other Ambulatory Visit: Payer: Self-pay | Admitting: Internal Medicine

## 2012-02-16 ENCOUNTER — Other Ambulatory Visit: Payer: Self-pay | Admitting: *Deleted

## 2012-02-16 MED ORDER — ROSUVASTATIN CALCIUM 5 MG PO TABS: ORAL_TABLET | ORAL | Status: DC

## 2012-03-05 ENCOUNTER — Ambulatory Visit (INDEPENDENT_AMBULATORY_CARE_PROVIDER_SITE_OTHER): Payer: BC Managed Care – PPO | Admitting: Internal Medicine

## 2012-03-05 ENCOUNTER — Other Ambulatory Visit: Payer: Self-pay | Admitting: Internal Medicine

## 2012-03-05 ENCOUNTER — Encounter: Payer: Self-pay | Admitting: Internal Medicine

## 2012-03-05 ENCOUNTER — Other Ambulatory Visit (INDEPENDENT_AMBULATORY_CARE_PROVIDER_SITE_OTHER): Payer: BC Managed Care – PPO

## 2012-03-05 VITALS — BP 140/82 | HR 80 | Temp 98.2°F | Resp 16 | Ht 65.75 in | Wt 189.0 lb

## 2012-03-05 DIAGNOSIS — E1149 Type 2 diabetes mellitus with other diabetic neurological complication: Secondary | ICD-10-CM

## 2012-03-05 DIAGNOSIS — Z0000 Encounter for general adult medical examination without abnormal findings: Secondary | ICD-10-CM

## 2012-03-05 DIAGNOSIS — R6889 Other general symptoms and signs: Secondary | ICD-10-CM

## 2012-03-05 DIAGNOSIS — K589 Irritable bowel syndrome without diarrhea: Secondary | ICD-10-CM

## 2012-03-05 DIAGNOSIS — D649 Anemia, unspecified: Secondary | ICD-10-CM

## 2012-03-05 DIAGNOSIS — K219 Gastro-esophageal reflux disease without esophagitis: Secondary | ICD-10-CM

## 2012-03-05 DIAGNOSIS — I10 Essential (primary) hypertension: Secondary | ICD-10-CM

## 2012-03-05 DIAGNOSIS — R899 Unspecified abnormal finding in specimens from other organs, systems and tissues: Secondary | ICD-10-CM

## 2012-03-05 DIAGNOSIS — E785 Hyperlipidemia, unspecified: Secondary | ICD-10-CM

## 2012-03-05 LAB — HEMOGLOBIN AND HEMATOCRIT, BLOOD
HCT: 36.7 % (ref 36.0–46.0)
Hemoglobin: 12.1 g/dL (ref 12.0–15.0)

## 2012-03-05 LAB — TSH: TSH: 2.41 u[IU]/mL (ref 0.35–5.50)

## 2012-03-05 LAB — BILIRUBIN, TOTAL: Total Bilirubin: 0.5 mg/dL (ref 0.3–1.2)

## 2012-03-05 LAB — HEMOGLOBIN A1C: Hgb A1c MFr Bld: 10.1 % — ABNORMAL HIGH (ref 4.6–6.5)

## 2012-03-05 LAB — VITAMIN B12: Vitamin B-12: 297 pg/mL (ref 211–911)

## 2012-03-05 NOTE — Patient Instructions (Addendum)
Thanks for coming in. You seem to be doing ok.   Plan is for limited labs today: blood count, protein stores check, thyroid check and A1C  Please develop a regular exercise program at least 3 times a week. This will help with weight control.   Come back in 3 months for routine follow up.

## 2012-03-05 NOTE — Progress Notes (Signed)
Subjective:    Patient ID: Beth Holden, female    DOB: [birth year], 67 y.o.   MRN: [id number]  HPI The patient is here for annual wellness examination and management of other chronic and acute problems.  In the interval she has had the second toe right foot amputed for osteomyelitis and did well.   She is current with Dr. Jennette Kettle with a normal PAP and breast exam. Mammogram in January was normal   The risk factors are reflected in the social history.  The roster of all physicians providing medical care to patient - is listed in the Snapshot section of the chart.  Activities of daily living:  The patient is 100% inedpendent in all ADLs: dressing, toileting, feeding as well as independent mobility  Home safety : The patient has smoke detectors in the home. Falls - she did slip on the ice two weeks ago but sustained no injury. They wear seatbelts. No firearms at home   There is no risks for hepatitis, STDs or HIV. There is no history of blood transfusion. They have no travel history to infectious disease endemic areas of the world.  The patient has seen their dentist in the last six month. They have seen their eye doctor in the last year. Did have elevated pressure but does not carry the diagnosis of glaucoma. They deny any hearing difficulty and have not had audiologic testing in the last year.    They do not  have excessive sun exposure. Discussed the need for sun protection: hats, long sleeves and use of sunscreen if there is significant sun exposure.   Diet: the importance of a healthy diet is discussed. They do have a healthy diet.  The patient has no regular exercise program.  The benefits of regular aerobic exercise were discussed.  Depression screen: there are no signs or vegative symptoms of depression- irritability, change in appetite, anhedonia, sadness/tearfullness.  Cognitive assessment: the patient manages all their financial and personal affairs and is actively engaged.    The following portions of the patient's history were reviewed and updated as appropriate: allergies, current medications, past family history, past medical history,  past surgical history, past social history  and problem list.  Past Medical History  Diagnosis Date  . Rhinitis, allergic   . Erosive esophagitis   . Peripheral neuropathy   . IBS (irritable bowel syndrome)   . Hyperlipemia     takes Crestor daily  . Acute appendicitis 01/05/2011  . ALLERGIC RHINITIS   . ALOPECIA NEC   . PONV (postoperative nausea and vomiting)   . Hypertension     takes Enalapril daily  . Seasonal allergies     takes Zyrtec daily  . History of bronchitis     last time a couple of yrs ago  . Headache     occasionally;r/t sinus   . Peripheral neuropathy   . Joint pain   . Osteomyelitis   . GERD (gastroesophageal reflux disease)     takes Nexium bid  . Constipation   . History of colon polyps   . Diabetes mellitus type II     takes Metformin and Flexpen daily  . DIABETES MELLITUS, TYPE II    Past Surgical History  Procedure Laterality Date  . Vaginal hysterectomy  2001  . Oophorectomy  2001  . Tubal ligation    . Rotator cuff repair      left  . Laparoscopic appendectomy  01/05/2011    Procedure: APPENDECTOMY LAPAROSCOPIC;  Surgeon: Thornton Park  Daphine Deutscher, MD;  Location: [facility] ORS;  Service: General;  Laterality: N/A;  . Appendectomy  2012  . Colonoscopy    . Toe amputation  12/28/2011    rt second toe  . Amputation  12/28/2011    Procedure: AMPUTATION DIGIT;  Surgeon: Nadara Mustard, MD;  [facility]: [facility];  Service: Orthopedics;  Laterality: Right;  Right Foot 2nd Toe Amputation at MTP (metatarsophalangeal joint)   Family History  Problem Relation Age of Onset  . Lung cancer Mother 89  . Cancer Mother     lung  . Hypertension Father   . Hyperlipidemia Father   . Diabetes Father   . Coronary artery disease Father   . Dementia Father   . Stomach cancer Paternal Aunt   . Cancer Paternal  Aunt     stomach  . Brain cancer Paternal Uncle   . Cancer Paternal Uncle     brain  . Colon cancer Neg Hx   . Irritable bowel syndrome      Several family members on fathers side   . Diabetes Other   . Cancer Maternal Aunt     stomach   History   Social History  . Marital Status: Married    Spouse Name: N/A    Number of Children: 2  . Years of Education: N/A   Occupational History  . Retired     Social History Main Topics  . Smoking status: Never Smoker   . Smokeless tobacco: Never Used  . Alcohol Use: No  . Drug Use: No  . Sexually Active: Yes    Birth Control/ Protection: Surgical   Other Topics Concern  . Not on file   Social History Narrative   Caffeine daily    [employer], [employer] no diploma   Married '66   1 dtr- '78; 1 son '71; 2 grandchildren   Occupation: retired 04   Dad with alzheimers-had to place in [place] (summer '10)         Current Outpatient Prescriptions on File Prior to Visit  Medication Sig Dispense Refill  . acetaminophen (TYLENOL) 500 MG tablet Take 500 mg by mouth as needed. pain      . amitriptyline (ELAVIL) 25 MG tablet Take 1 tablet (25 mg total) by mouth at bedtime.  90 tablet  3  . cetirizine (ZYRTEC) 10 MG tablet Take 10 mg by mouth daily.       . enalapril (VASOTEC) 20 MG tablet Take 20 mg by mouth daily.  90 tablet  3  . esomeprazole (NEXIUM) 40 MG capsule Take 40 mg by mouth 2 (two) times daily.  180 capsule  3  . Fluocinolone Acetonide (DERMOTIC) 0.01 % OIL Place in ear(s) as directed.       . gabapentin (NEURONTIN) 300 MG capsule Take 1 capsule (300 mg total) by mouth daily.  90 capsule  3  . glucose blood (ONE TOUCH ULTRA TEST) test strip Use as instructed. Testing up to three times a day.  100 each  11  . Multiple Vitamins-Minerals (CENTRUM SILVER) tablet Take 1 tablet by mouth daily.      . rosuvastatin (CRESTOR) 5 MG tablet TAKE 1 TABLET AT BEDTIME  90 tablet  3  . doxycycline (VIBRA-TABS) 100 MG tablet Take 100 mg by mouth 2 (two)  times daily.      Marland Kitchen HYDROcodone-acetaminophen (VICODIN) 5-500 MG per tablet Take 1 tablet by mouth every 6 (six) hours as needed for pain.  30 tablet  0  .  metFORMIN (GLUCOPHAGE) 1000 MG tablet Take 1 tablet (1,000 mg total) by mouth 2 (two) times daily with a meal.  180 tablet  3   No current facility-administered medications on file prior to visit.     Vision, hearing, body mass index were assessed and reviewed.   During the course of the visit the patient was educated and counseled about appropriate screening and preventive services including : fall prevention , diabetes screening, nutrition counseling, colorectal cancer screening, and recommended immunizations.    Review of Systems System review is negative for any constitutional, cardiac, pulmonary, GI or neuro symptoms or complaints other than as described in the HPI.     Objective:   Physical Exam Filed Vitals:   03/05/12 0900  BP: 140/82  Pulse: 80  Temp: 98.2 F (36.8 C)  Resp: 16   Wt Readings from Last 3 Encounters:  03/05/12 189 lb (85.73 kg)  12/26/11 188 lb 11.4 oz (85.6 kg)  11/17/11 190 lb 1.9 oz (86.238 kg)   Gen'l: well nourished, well developed white Woman in no distress HEENT - [place]/AT, EACs/TMs normal, oropharynx with native dentition in good condition, no buccal or palatal lesions, posterior pharynx clear, mucous membranes moist. C&S clear, PERRLA, fundi - normal Neck - supple, no thyromegaly Nodes- negative submental, cervical, supraclavicular regions Chest - no deformity, no CVAT Lungs - clear without rales, wheezes. No increased work of breathing Breast - deferred to gyn and mammogram Cardiovascular - regular rate and rhythm, quiet precordium, no murmurs, rubs or gallops, 2+ radial, DP and PT pulses Abdomen - BS+ x 4, no HSM, no guarding or rebound or tenderness Pelvic - deferred to gyn Rectal - deferred to GI Extremities - no clubbing, cyanosis, edema or deformity. Hammer toe deformities. @nd  toe  right gone. Neuro - A&O x 3, CN II-XII normal, motor strength normal and equal, DTRs 2+ and symmetrical biceps, radial, and patellar tendons. Cerebellar - no tremor, no rigidity, fluid movement and normal gait. Normal sensation feet to light touch, pin prick, decreased vibration left foot Derm - Head, neck, back, abdomen and extremities without suspicious lesions  Lab Results  Component Value Date   WBC 10.4 12/26/2011   HGB 12.1 03/05/2012   HCT 36.7 03/05/2012   PLT 422* 12/26/2011   GLUCOSE 217* 12/26/2011   CHOL 146 02/04/2010   TRIG 85.0 02/04/2010   HDL 44.90 02/04/2010   LDLCALC 84 02/04/2010   ALT 19 12/26/2011   AST 28 12/26/2011   NA 140 12/26/2011   K 3.8 12/26/2011   CL 100 12/26/2011   CREATININE 0.67 12/26/2011   BUN 18 12/26/2011   CO2 29 12/26/2011   TSH 2.41 03/05/2012   INR 1.06 12/26/2011   HGBA1C 10.1* 03/05/2012         Assessment & Plan:

## 2012-03-06 LAB — PREALBUMIN: Prealbumin: 19.4 mg/dL (ref 17.0–34.0)

## 2012-03-06 NOTE — Assessment & Plan Note (Signed)
Reconfirmed on exam. She does have a new toe ulcer. She does check her feet daily. Fortunately there is no pain.  Plan Better diabetes control.

## 2012-03-06 NOTE — Assessment & Plan Note (Signed)
BP Readings from Last 3 Encounters:  03/05/12 140/82  12/29/11 118/67  12/29/11 118/67   Generally good control.   Plan Continue present medication

## 2012-03-06 NOTE — Assessment & Plan Note (Signed)
Stble w/ no complaints of bloating, pain or constipation.

## 2012-03-06 NOTE — Assessment & Plan Note (Signed)
A1C 10.1% - too high. Good control has been difficult to attain.  Plan - will refer to endocrine for consultation.

## 2012-03-06 NOTE — Assessment & Plan Note (Signed)
Last lipid panel 2 years ago was good.   Plan - needs to have repeat lipid panel - she may return for this at her convenience.

## 2012-03-06 NOTE — Assessment & Plan Note (Signed)
Interval history notable for amputation of toe otherwise she has been stable. Physical exam, sans breast or pelvic, is notable for weight issues, peripheral neuropathy and small ulcer at the tip of the third toe. Past and current lab reviewed: poor control of DM and by oversight no recent lipid panel. Blood counts, renal function, liver functions all stable. She is current with colorectal and breast cancer screening. Immunizations are current except for pneumonia vaccine.  In summary - a very nice woman who needs consultation for better control of diabetes. She will return for follow up in 3-4 months.

## 2012-03-06 NOTE — Assessment & Plan Note (Signed)
Symptoms are well controlled on nexium - no change in treatment

## 2012-03-16 ENCOUNTER — Ambulatory Visit: Payer: BC Managed Care – PPO | Admitting: Internal Medicine

## 2012-03-20 ENCOUNTER — Other Ambulatory Visit: Payer: Self-pay | Admitting: Internal Medicine

## 2012-03-23 ENCOUNTER — Encounter (HOSPITAL_COMMUNITY): Payer: Self-pay | Admitting: *Deleted

## 2012-03-23 ENCOUNTER — Emergency Department (HOSPITAL_COMMUNITY)
Admission: EM | Admit: 2012-03-23 | Discharge: 2012-03-23 | Disposition: A | Discharge status: Home / Self Care | Payer: Medicare Other | Source: Home / Self Care

## 2012-03-23 DIAGNOSIS — E1169 Type 2 diabetes mellitus with other specified complication: Secondary | ICD-10-CM

## 2012-03-23 DIAGNOSIS — L089 Local infection of the skin and subcutaneous tissue, unspecified: Secondary | ICD-10-CM

## 2012-03-23 DIAGNOSIS — E11628 Type 2 diabetes mellitus with other skin complications: Secondary | ICD-10-CM

## 2012-03-23 LAB — GLUCOSE, CAPILLARY: Glucose-Capillary: 179 mg/dL — ABNORMAL HIGH (ref 70–99)

## 2012-03-23 MED ORDER — FLUCONAZOLE 150 MG PO TABS: 150.0000 mg | ORAL_TABLET | Freq: Once | ORAL | Status: DC

## 2012-03-23 MED ORDER — DOXYCYCLINE HYCLATE 100 MG PO CAPS: 100.0000 mg | ORAL_CAPSULE | Freq: Two times a day (BID) | ORAL | Status: DC

## 2012-03-23 MED ORDER — TRIPLE ANTIBIOTIC 5-400-5000 EX OINT: TOPICAL_OINTMENT | Freq: Four times a day (QID) | CUTANEOUS | Status: DC

## 2012-03-23 NOTE — ED Provider Notes (Signed)
History     CSN: [id number]  Arrival date & time 03/23/12  1805   First MD Initiated Contact with Patient 03/23/12 1857      Chief Complaint  Patient presents with  . Blister    (Consider location/radiation/quality/duration/timing/severity/associated sxs/prior treatment) HPI 67 y.o. female, poorly controlled diabetes with blisters and pain in left foot. Mostly 2nd and 3rd digits. Just seen at Dr. Audrie Lia today (ortho) for right toe. Had toe debrided. Told no infection but end of right 3rd digit with ulcer/black. Hospitalized in December and right 2nd toe amputated.  Left foot was fine earlier today. Told to wear diabetic shoe but hadn't worn in a few days. When took shoe off today, burning pain in toes and large blisters, red streak up foot.  No fever/chills.  Past Medical History  Diagnosis Date  . Rhinitis, allergic   . Erosive esophagitis   . Peripheral neuropathy   . IBS (irritable bowel syndrome)   . Hyperlipemia     takes Crestor daily  . Acute appendicitis 01/05/2011  . ALLERGIC RHINITIS   . ALOPECIA NEC   . PONV (postoperative nausea and vomiting)   . Hypertension     takes Enalapril daily  . Seasonal allergies     takes Zyrtec daily  . History of bronchitis     last time a couple of yrs ago  . Headache     occasionally;r/t sinus   . Peripheral neuropathy   . Joint pain   . Osteomyelitis   . GERD (gastroesophageal reflux disease)     takes Nexium bid  . Constipation   . History of colon polyps   . Diabetes mellitus type II     takes Metformin and Flexpen daily  . DIABETES MELLITUS, TYPE II     Past Surgical History  Procedure Laterality Date  . Vaginal hysterectomy  2001  . Oophorectomy  2001  . Tubal ligation    . Rotator cuff repair      left  . Laparoscopic appendectomy  01/05/2011    Procedure: APPENDECTOMY LAPAROSCOPIC;  Surgeon: Valarie Merino, MD;  Location: [facility] OR[facility];  Service: General;  Laterality: N/A;  . Appendectomy  2012  . Colonoscopy     . Toe amputation  12/28/2011    rt second toe  . Amputation  12/28/2011    Procedure: AMPUTATION DIGIT;  Surgeon: Nadara Mustard, MD;  Location: [facility] OR;  Service: Orthopedics;  Laterality: Right;  Right Foot 2nd Toe Amputation at MTP (metatarsophalangeal joint)    Family History  Problem Relation Age of Onset  . Lung cancer Mother 36  . Cancer Mother     lung  . Hypertension Father   . Hyperlipidemia Father   . Diabetes Father   . Coronary artery disease Father   . Dementia Father   . Stomach cancer Paternal Aunt   . Cancer Paternal Aunt     stomach  . Brain cancer Paternal Uncle   . Cancer Paternal Uncle     brain  . Colon cancer Neg Hx   . Irritable bowel syndrome      Several family members on fathers side   . Diabetes Other   . Cancer Maternal Aunt     stomach    History  Substance Use Topics  . Smoking status: Never Smoker   . Smokeless tobacco: Never Used  . Alcohol Use: No    OB History   Grav Para Term Preterm Abortions TAB SAB Ect Mult Living  Review of Systems  Constitutional: Negative for fever, chills and diaphoresis.  Musculoskeletal: Negative for myalgias and joint swelling.  Neurological: Negative for dizziness and light-headedness.  All other systems reviewed and are negative.    Allergies  Codeine and Propoxyphene hcl  Home Medications   Current Outpatient Rx  Name  Route  Sig  Dispense  Refill  . amitriptyline (ELAVIL) 25 MG tablet      TAKE 1 TABLET (25 MG TOTAL) AT BEDTIME   90 tablet   3   . cetirizine (ZYRTEC) 10 MG tablet   Oral   Take 10 mg by mouth daily.          . enalapril (VASOTEC) 20 MG tablet      TAKE 1 TABLET DAILY   90 tablet   3   . Fluocinolone Acetonide (DERMOTIC) 0.01 % OIL   Otic   Place in ear(s) as directed.          . gabapentin (NEURONTIN) 300 MG capsule      TAKE 1 CAPSULE (300 MG TOTAL) DAILY   90 capsule   3   . glucose blood (ONE TOUCH ULTRA TEST) test strip       Use as instructed. Testing up to three times a day.   100 each   11   . insulin detemir (LEVEMIR) 100 UNIT/ML injection   Subcutaneous   Inject 44 Units into the skin 2 (two) times daily.         . Insulin Pen Needle 31G X 8 MM MISC   Does not apply   44 Units by Does not apply route 2 (two) times daily.         . Multiple Vitamins-Minerals (CENTRUM SILVER) tablet   Oral   Take 1 tablet by mouth daily.         Marland Kitchen NEXIUM 40 MG capsule      TAKE 1 CAPSULE TWICE A DAY   180 capsule   3   . rosuvastatin (CRESTOR) 5 MG tablet      TAKE 1 TABLET AT BEDTIME   90 tablet   3   . acetaminophen (TYLENOL) 500 MG tablet   Oral   Take 500 mg by mouth as needed. pain         . doxycycline (VIBRA-TABS) 100 MG tablet   Oral   Take 100 mg by mouth 2 (two) times daily.         Marland Kitchen HYDROcodone-acetaminophen (VICODIN) 5-500 MG per tablet   Oral   Take 1 tablet by mouth every 6 (six) hours as needed for pain.   30 tablet   0   . EXPIRED: metFORMIN (GLUCOPHAGE) 1000 MG tablet   Oral   Take 1,000 mg by mouth 2 (two) times daily with a meal.           BP 151/84  Pulse 90  Temp(Src) 97.1 F (36.2 C) (Oral)  Resp 18  SpO2 98%  Physical Exam  Constitutional: She is oriented to person, place, and time. She appears well-developed and well-nourished.  HENT:  Head: Normocephalic and atraumatic.  Eyes: Conjunctivae are normal.  Neck: Normal range of motion. Neck supple.  Cardiovascular: Normal rate.   Pulmonary/Chest: Effort normal. No respiratory distress.  Musculoskeletal:  Right 2nd toe absent with surgical scar. Right 3rd toe with dressing in place. Pedal and tibial pulses intact bilaterally, feet cold.  Left foot:  Large blister/vesicle on distal 2nd and 3rd toes, weeping clear fluid. Mild erythema  with red streak up dorsal mid-foot from wedge between 2nd and 3rd digits. Hammer toe with large callous on 2nd digit. No warmth or tenderness of ankle/foot.  Neurological:  She is alert and oriented to person, place, and time.  Skin: Skin is warm and dry.  Psychiatric: She has a normal mood and affect.    ED Course  Procedures (including critical care time)   No results found. Results for orders placed during the hospital encounter of 03/23/12 (from the past 24 hour(s))  GLUCOSE, CAPILLARY     Status: Abnormal   Collection Time    03/23/12  8:57 PM      Result Value Range   Glucose-Capillary 179 (*) 70 - 99 mg/dL      1. Diabetic infection of left foot    Discussed with Dr. Lajoyce Corners (ortho).  - postop shoe to protect toes from further injury - Keep clean with soap and water, keep dry - Antibiotic ointment - Doxycycline - F/U with Dr. Lajoyce Corners on Monday in office.  Napoleon Form, MD       Napoleon Form, MD 03/23/12 [phone]

## 2012-03-23 NOTE — ED Notes (Signed)
Blisters on Left great toe, 2nd toe and third toe that came up today.  Hx. Diabetes. Had R 2nd toe removed for same problem in December.  Saw Dr. Lajoyce Corners today.

## 2012-04-13 ENCOUNTER — Encounter: Payer: Self-pay | Admitting: Internal Medicine

## 2012-04-13 ENCOUNTER — Ambulatory Visit (INDEPENDENT_AMBULATORY_CARE_PROVIDER_SITE_OTHER): Payer: BC Managed Care – PPO | Admitting: Internal Medicine

## 2012-04-13 VITALS — BP 118/70 | HR 102 | Temp 98.3°F | Resp 10 | Ht 65.5 in | Wt 191.0 lb

## 2012-04-13 DIAGNOSIS — E1149 Type 2 diabetes mellitus with other diabetic neurological complication: Secondary | ICD-10-CM

## 2012-04-13 MED ORDER — SITAGLIPTIN PHOSPHATE 100 MG PO TABS: 100.0000 mg | ORAL_TABLET | Freq: Every day | ORAL | Status: DC

## 2012-04-13 NOTE — Progress Notes (Signed)
Subjective:     Patient ID: Beth Holden, female   DOB: [birth year], 67 y.o.   MRN: [id number]  HPI Beth Holden is a pleasant 67 y/o woman referred by her PCP, Dr. Debby Bud, for management of DM2, uncontrolled, insulin-dependent, with complications (diabetic peripheral neuropathy, toe amputation - after infected diabetic toe ulcer).  She was dx with DM2 in ~2000. Insulin was added in 2013. Prior to this, she was on Byetta. Last HbA1C was 10.1% on 03/05/2012, but previously 12.6%. She is on: - Metformin 1000 mg bid - Levemir 47 units bid, with instructions to increase by 3 units if sugars >150 x 3 days in a row  She checks her sugars 1x a day: - am: 95 - 200 Lowest: 75, since started insulin x 1. Has hypoglycemia in the 70's.   She has a snack: crackers + cheese at nighttime. She usually gets hungry around that time. Dinner is at 6-7 pm. She mentions that she sometimes she feels shaky late after dinner if she does not eat a snack. She is not walking anymore >> 10 lbs in last year. Diet: - Breakfast: egg salad or oatmeal or grits - Lunch: salad, soup, hamburger + fries - Dinner: meat, vegetables, or hot dog plus salad - Snacks: 2- crackers, cheese, popcorn, chips  Her last eye exam in 11/2011: no DR. No CKD. Has numbness and tingling in feet. She is on Gabapentin and Amitriptyline, but feels that these are not helping. She has neuropathic pain (lightning stabs of pain). She had her 2nd R toe amputated by Dr. Lajoyce Corners in 12/2011. [facility] but would not like to return there. She now has a new ulcer on the third left toe, for which she was seen in urgent care and given doxycycline.  Review of Systems Constitutional: + weight gain, + fatigue, no subjective hyperthermia/hypothermia, increased thirst at night b/c dry mouth, increased appetite, hot flashes, poor sleep Eyes: sometimes blurry vision, no xerophthalmia ENT: no sore throat, no nodules palpated in throat, no  dysphagia/odynophagia, no hoarseness Cardiovascular: no CP/SOB/palpitations/leg swelling Respiratory: + cough/SOB Gastrointestinal: no N/V/plusD/plusC, + GERD Musculoskeletal: + muscle aches/no joint aches Skin: + rash, itching, hair loss Neurological: no tremors/numbness/tingling/dizziness Psychiatric: no depression/anxiety Low libido  Past Medical History  Diagnosis Date  . Rhinitis, allergic   . Erosive esophagitis   . Peripheral neuropathy   . IBS (irritable bowel syndrome)   . Hyperlipemia     takes Crestor daily  . Acute appendicitis 01/05/2011  . ALLERGIC RHINITIS   . ALOPECIA NEC   . PONV (postoperative nausea and vomiting)   . Hypertension     takes Enalapril daily  . Seasonal allergies     takes Zyrtec daily  . History of bronchitis     last time a couple of yrs ago  . Headache     occasionally;r/t sinus   . Peripheral neuropathy   . Joint pain   . Osteomyelitis   . GERD (gastroesophageal reflux disease)     takes Nexium bid  . Constipation   . History of colon polyps   . Diabetes mellitus type II     takes Metformin and Flexpen daily  . DIABETES MELLITUS, TYPE II    Past Surgical History  Procedure Laterality Date  . Vaginal hysterectomy  2001  . Oophorectomy  2001  . Tubal ligation    . Rotator cuff repair      left  . Laparoscopic appendectomy  01/05/2011  Procedure: APPENDECTOMY LAPAROSCOPIC;  Surgeon: Valarie Merino, MD;  Location: [facility];  Service: General;  Laterality: N/A;  . Appendectomy  2012  . Colonoscopy    . Toe amputation  12/28/2011    rt second toe  . Amputation  12/28/2011    Procedure: AMPUTATION DIGIT;  Surgeon: Nadara Mustard, MD;  [facility];  Service: Orthopedics;  Laterality: Right;  Right Foot 2nd Toe Amputation at MTP (metatarsophalangeal joint)   History   Social History  . Marital Status: Married    Spouse Name: N/A    Number of Children: 2: 34, 35   Occupational History  . Retired, worked in Photographer     Social History Main Topics  . Smoking status: Never Smoker   . Smokeless tobacco: Never Used  . Alcohol Use: No  . Drug Use: No  . Sexually Active: Yes    Birth Control/ Protection: Surgical   Other Topics Concern  . Baby-sits during the week, can only have appts on Fridays   Social History Narrative   Caffeine daily    [employer], [employer] no diploma   Married '66   1 dtr- '78; 1 son '71; 2 grandchildren   Occupation: retired 04   Dad with alzheimers-had to place in [place] (summer '10)   Current Outpatient Prescriptions on File Prior to Visit  Medication Sig Dispense Refill  . acetaminophen (TYLENOL) 500 MG tablet Take 500 mg by mouth as needed. pain      . amitriptyline (ELAVIL) 25 MG tablet TAKE 1 TABLET (25 MG TOTAL) AT BEDTIME  90 tablet  3  . cetirizine (ZYRTEC) 10 MG tablet Take 10 mg by mouth daily.       Marland Kitchen doxycycline (VIBRAMYCIN) 100 MG capsule Take 1 capsule (100 mg total) by mouth 2 (two) times daily.  14 capsule  0  . enalapril (VASOTEC) 20 MG tablet TAKE 1 TABLET DAILY  90 tablet  3  . fluconazole (DIFLUCAN) 150 MG tablet Take 1 tablet (150 mg total) by mouth once.  1 tablet  0  . Fluocinolone Acetonide (DERMOTIC) 0.01 % OIL Place in ear(s) as directed.       . gabapentin (NEURONTIN) 300 MG capsule TAKE 1 CAPSULE (300 MG TOTAL) DAILY  90 capsule  3  . glucose blood (ONE TOUCH ULTRA TEST) test strip Use as instructed. Testing up to three times a day.  100 each  11  . HYDROcodone-acetaminophen (VICODIN) 5-500 MG per tablet Take 1 tablet by mouth every 6 (six) hours as needed for pain.  30 tablet  0  . insulin detemir (LEVEMIR) 100 UNIT/ML injection Inject 44 Units into the skin 2 (two) times daily.      . Insulin Pen Needle 31G X 8 MM MISC 44 Units by Does not apply route 2 (two) times daily.      . Multiple Vitamins-Minerals (CENTRUM SILVER) tablet Take 1 tablet by mouth daily.      Marland Kitchen neomycin-bacitracin-polymyxin (NEOSPORIN) [id number] ointment Apply topically 4 (four) times  daily.  28.3 g  0  . NEXIUM 40 MG capsule TAKE 1 CAPSULE TWICE A DAY  180 capsule  3  . rosuvastatin (CRESTOR) 5 MG tablet TAKE 1 TABLET AT BEDTIME  90 tablet  3  . metFORMIN (GLUCOPHAGE) 1000 MG tablet Take 1,000 mg by mouth 2 (two) times daily with a meal.       No current facility-administered medications on file prior to visit.   Allergies  Allergen Reactions  .  Codeine     Makes her crazy  . Propoxyphene Hcl Itching   Family History  Problem Relation Age of Onset  . Lung cancer Mother 66  . Cancer Mother     lung  . Hypertension Father   . Hyperlipidemia Father   . Diabetes Father   . Coronary artery disease Father   . Dementia Father   . Stomach cancer Paternal Aunt   . Cancer Paternal Aunt     stomach  . Brain cancer Paternal Uncle   . Cancer Paternal Uncle     brain  . Colon cancer Neg Hx   . Irritable bowel syndrome      Several family members on fathers side   . Diabetes Other   . Cancer Maternal Aunt     stomach   Objective:   Physical Exam BP 118/70  Pulse 102  Temp(Src) 98.3 F (36.8 C) (Oral)  Resp 10  Ht 5' 5.5" (1.664 m)  Wt 191 lb (86.637 kg)  BMI 31.29 kg/m2  SpO2 97% Wt Readings from Last 3 Encounters:  04/13/12 191 lb (86.637 kg)  03/05/12 189 lb (85.73 kg)  12/26/11 188 lb 11.4 oz (85.6 kg)   Constitutional: overweight, in NAD Eyes: PERRLA, EOMI, no exophthalmos ENT: moist mucous membranes, no thyromegaly, no cervical lymphadenopathy Cardiovascular: RRR, No MRG Respiratory: CTA B Gastrointestinal: abdomen soft, NT, ND, BS+ Musculoskeletal: no deformities, strength intact in all 4; missing second right toe Skin: moist, warm, no rashes, left foot in boot, with affected toe bandaged Neurological: no tremor with outstretched hands, DTR normal in all 4  Assessment:     1. DM2, uncontrolled, insulin-dependent, with complications - diabetic peripheral neuropathy - toe amputation - after infected diabetic toe ulcer  2. Diabetic  peripheral neuropathy    Plan:     1. DM2 Patient with long-standing diabetes, poorly controlled, with history of toe amputation and new toe ulcer. Patient is on a high dose of basal insulin, but no mealtime insulin. She was on a GLP-1 agonist, however stopped because of fear of side effects. She continues her metformin at target dose. She has some signs that she is taking too much insulin, as she gets shaky especially late after dinner. She usually has to have a snack at night. - At this visit, we discussed about the need to gain good control of her diabetes to reduce the chances to develop further complications - I think she would be a great candidate for a VGo system, we discussed about this, and I showed her the mechanical pump and explained the principles. I anticipate that she will require much less insulin with this system. I believe that we can start with a 30 units reservoir. We can start with a basal rate only initially, and then add bolusing. - I will refer her to diabetes education for demonstration and for further help with the VGo - For now, I advised her to start Januvia 100 mg daily - since I do not have concrete evidence that she is dropping her sugars, I will continue the current dose of Levemir, but I advised her to let me know as soon as she gets at or <70 - will continue her metformin at the current dose - given sugar log and advised how to fill it and to bring it at next appt - given foot care handout and explained the principles - given instructions for hypoglycemia management "15-15 rule" - return to clinic in one month with her sugar  log  2. Peripheral neuropathy - The patient would like to come off amitriptyline and gabapentin, since she feels that these are not helping. She is on low doses of both, so I think it is okay to just stop them - I advised her to go back on them if the pain becomes more severe - I suggested that she tries alpha lipoic acid plus B complex  vitamin

## 2012-04-13 NOTE — Patient Instructions (Addendum)
Please return in a month with your sugar log. Please stay on the same dose of Levemir for now.  Please let me know if you have sugars <70. Start Januvia 100 mg daily in am. Continue Metformin at the same dose. Diabetes educators will call you for the appointment for VGo system. We will also need to check with your insurance to see if it is covered. Start alpha lipoic acid (approximately 600 mg daily, but check the instructions on the bottle) and B complex vitamins. Can give it a try off Amitriptyline and Gabapentin, but if pain increases, you can go back on these.

## 2012-04-23 ENCOUNTER — Other Ambulatory Visit (HOSPITAL_COMMUNITY): Payer: Self-pay | Admitting: Orthopedic Surgery

## 2012-04-23 ENCOUNTER — Encounter (HOSPITAL_COMMUNITY): Payer: Self-pay

## 2012-04-23 NOTE — Pre-Procedure Instructions (Signed)
Beth Holden  04/23/2012   Your procedure is scheduled on:  April 16  Report to [facility] at 08:30 AM.  Call this number if you have problems the morning of surgery: [phone]   Remember:   Do not eat food or drink liquids after midnight.   Take these medicines the morning of surgery with A SIP OF WATER: Tylenol (if needed), Zyrtec, Nexium, Gabapentin,    STOP Multiple Vitamins after today  Do not wear jewelry, make-up or nail polish.  Do not wear lotions, powders, or perfumes. You may wear deodorant.  Do not shave 48 hours prior to surgery. Men may shave face and neck.  Do not bring valuables to the hospital.  Contacts, dentures or bridgework may not be worn into surgery.  Leave suitcase in the car. After surgery it may be brought to your room.  For patients admitted to the hospital, checkout time is 11:00 AM the day of  discharge.   Patients discharged the day of surgery will not be allowed to drive  home.  Name and phone number of your driver: Family/ Friend  Special Instructions: Shower using CHG 2 nights before surgery and the night before surgery.  If you shower the day of surgery use CHG.  Use special wash - you have one bottle of CHG for all showers.  You should use approximately 1/3 of the bottle for each shower.   Please read over the following fact sheets that you were given: Pain Booklet, Coughing and Deep Breathing and Surgical Site Infection Prevention

## 2012-04-24 ENCOUNTER — Encounter: Payer: Self-pay | Admitting: Internal Medicine

## 2012-04-24 ENCOUNTER — Encounter (HOSPITAL_COMMUNITY)
Admission: RE | Admit: 2012-04-24 | Discharge: 2012-04-24 | Disposition: A | Discharge status: Home / Self Care | Payer: BC Managed Care – PPO | Source: Ambulatory Visit | Attending: Orthopedic Surgery | Admitting: Orthopedic Surgery

## 2012-04-24 ENCOUNTER — Encounter (HOSPITAL_COMMUNITY): Payer: Self-pay

## 2012-04-24 ENCOUNTER — Ambulatory Visit (HOSPITAL_COMMUNITY)
Admission: RE | Admit: 2012-04-24 | Discharge: 2012-04-24 | Disposition: A | Discharge status: Home / Self Care | Payer: BC Managed Care – PPO | Source: Ambulatory Visit | Attending: Anesthesiology | Admitting: Anesthesiology

## 2012-04-24 LAB — COMPREHENSIVE METABOLIC PANEL
ALT: 19 U/L (ref 0–35)
AST: 27 U/L (ref 0–37)
Albumin: 3.6 g/dL (ref 3.5–5.2)
Alkaline Phosphatase: 56 U/L (ref 39–117)
BUN: 16 mg/dL (ref 6–23)
CO2: 29 mEq/L (ref 19–32)
Calcium: 9.8 mg/dL (ref 8.4–10.5)
Chloride: 101 mEq/L (ref 96–112)
Creatinine, Ser: 0.59 mg/dL (ref 0.50–1.10)
GFR calc Af Amer: >90 mL/min (ref >90)
GFR calc non Af Amer: >90 mL/min (ref >90)
Glucose, Bld: 161 mg/dL — ABNORMAL HIGH (ref 70–99)
Potassium: 4 mEq/L (ref 3.5–5.1)
Sodium: 138 mEq/L (ref 135–145)
Total Bilirubin: 0.2 mg/dL — ABNORMAL LOW (ref 0.3–1.2)
Total Protein: 7.9 g/dL (ref 6.0–8.3)

## 2012-04-24 LAB — CBC
HCT: 35.8 % — ABNORMAL LOW (ref 36.0–46.0)
Hemoglobin: 11.8 g/dL — ABNORMAL LOW (ref 12.0–15.0)
MCH: 26 pg (ref 26.0–34.0)
MCHC: 33 g/dL (ref 30.0–36.0)
MCV: 78.9 fL (ref 78.0–100.0)
Platelets: 448 10*3/uL — ABNORMAL HIGH (ref 150–400)
RBC: 4.54 MIL/uL (ref 3.87–5.11)
RDW: 13.4 % (ref 11.5–15.5)
WBC: 10.5 10*3/uL (ref 4.0–10.5)

## 2012-04-24 LAB — PROTIME-INR
INR: 0.98 (ref 0.00–1.49)
Prothrombin Time: 12.9 seconds (ref 11.6–15.2)

## 2012-04-24 LAB — SURGICAL PCR SCREEN
MRSA, PCR: NEGATIVE
Staphylococcus aureus: NEGATIVE

## 2012-04-24 LAB — APTT: aPTT: 30 seconds (ref 24–37)

## 2012-04-24 MED ORDER — CEFAZOLIN SODIUM-DEXTROSE 2-3 GM-% IV SOLR
2.0000 g | INTRAVENOUS | Status: AC
  Administered 2012-04-25: 2 g via INTRAVENOUS
  Filled 2012-04-24: qty 50

## 2012-04-24 NOTE — Progress Notes (Signed)
Elnita Maxwell notified that orders need to be signed by Dr. Lajoyce Corners

## 2012-04-25 ENCOUNTER — Encounter (HOSPITAL_COMMUNITY): Payer: Self-pay | Admitting: Anesthesiology

## 2012-04-25 ENCOUNTER — Encounter (HOSPITAL_COMMUNITY)
Admission: RE | Disposition: A | Discharge status: Home / Self Care | Payer: Self-pay | Source: Ambulatory Visit | Attending: Orthopedic Surgery

## 2012-04-25 ENCOUNTER — Encounter (HOSPITAL_COMMUNITY): Payer: Self-pay | Admitting: *Deleted

## 2012-04-25 ENCOUNTER — Ambulatory Visit (HOSPITAL_COMMUNITY): Payer: BC Managed Care – PPO | Admitting: Anesthesiology

## 2012-04-25 ENCOUNTER — Ambulatory Visit (HOSPITAL_COMMUNITY)
Admission: RE | Admit: 2012-04-25 | Discharge: 2012-04-25 | Disposition: A | Discharge status: Home / Self Care | Payer: BC Managed Care – PPO | Source: Ambulatory Visit | Attending: Orthopedic Surgery | Admitting: Orthopedic Surgery

## 2012-04-25 DIAGNOSIS — K219 Gastro-esophageal reflux disease without esophagitis: Secondary | ICD-10-CM | POA: Insufficient documentation

## 2012-04-25 DIAGNOSIS — I10 Essential (primary) hypertension: Secondary | ICD-10-CM | POA: Insufficient documentation

## 2012-04-25 DIAGNOSIS — E1169 Type 2 diabetes mellitus with other specified complication: Secondary | ICD-10-CM | POA: Insufficient documentation

## 2012-04-25 DIAGNOSIS — E785 Hyperlipidemia, unspecified: Secondary | ICD-10-CM | POA: Insufficient documentation

## 2012-04-25 DIAGNOSIS — M9080 Osteopathy in diseases classified elsewhere, unspecified site: Secondary | ICD-10-CM | POA: Insufficient documentation

## 2012-04-25 DIAGNOSIS — M86679 Other chronic osteomyelitis, unspecified ankle and foot: Secondary | ICD-10-CM | POA: Insufficient documentation

## 2012-04-25 DIAGNOSIS — M86171 Other acute osteomyelitis, right ankle and foot: Secondary | ICD-10-CM

## 2012-04-25 DIAGNOSIS — J309 Allergic rhinitis, unspecified: Secondary | ICD-10-CM | POA: Insufficient documentation

## 2012-04-25 HISTORY — PX: AMPUTATION: SHX166

## 2012-04-25 LAB — GLUCOSE, CAPILLARY
Glucose-Capillary: 133 mg/dL — ABNORMAL HIGH (ref 70–99)
Glucose-Capillary: 103 mg/dL — ABNORMAL HIGH (ref 70–99)

## 2012-04-25 SURGERY — AMPUTATION DIGIT
Anesthesia: Monitor Anesthesia Care | Site: Foot | Laterality: Right | Wound class: Dirty or Infected

## 2012-04-25 MED ORDER — MEPERIDINE HCL 25 MG/ML IJ SOLN: 6.2500 mg | INTRAMUSCULAR | Status: DC | PRN

## 2012-04-25 MED ORDER — OXYCODONE HCL 5 MG PO TABS: 5.0000 mg | ORAL_TABLET | Freq: Once | ORAL | Status: DC | PRN

## 2012-04-25 MED ORDER — 0.9 % SODIUM CHLORIDE (POUR BTL) OPTIME
TOPICAL | Status: DC | PRN
  Administered 2012-04-25: 1000 mL

## 2012-04-25 MED ORDER — LACTATED RINGERS IV SOLN
INTRAVENOUS | Status: DC | PRN
  Administered 2012-04-25: 10:00:00 via INTRAVENOUS

## 2012-04-25 MED ORDER — PROMETHAZINE HCL 25 MG/ML IJ SOLN: 6.2500 mg | INTRAMUSCULAR | Status: DC | PRN

## 2012-04-25 MED ORDER — FENTANYL CITRATE 0.05 MG/ML IJ SOLN: 25.0000 ug | INTRAMUSCULAR | Status: DC | PRN

## 2012-04-25 MED ORDER — LIDOCAINE HCL (CARDIAC) 20 MG/ML IV SOLN
INTRAVENOUS | Status: DC | PRN
  Administered 2012-04-25: 100 mg via INTRAVENOUS

## 2012-04-25 MED ORDER — HYDROCODONE-ACETAMINOPHEN 5-325 MG PO TABS: 1.0000 | ORAL_TABLET | Freq: Four times a day (QID) | ORAL | Status: DC | PRN

## 2012-04-25 MED ORDER — FENTANYL CITRATE 0.05 MG/ML IJ SOLN
INTRAMUSCULAR | Status: DC | PRN
  Administered 2012-04-25: 50 ug via INTRAVENOUS
  Administered 2012-04-25: 100 ug via INTRAVENOUS

## 2012-04-25 MED ORDER — MIDAZOLAM HCL 5 MG/5ML IJ SOLN
INTRAMUSCULAR | Status: DC | PRN
  Administered 2012-04-25: 2 mg via INTRAVENOUS

## 2012-04-25 MED ORDER — MIDAZOLAM HCL 2 MG/2ML IJ SOLN: 0.5000 mg | Freq: Once | INTRAMUSCULAR | Status: DC | PRN

## 2012-04-25 MED ORDER — ROPIVACAINE HCL 5 MG/ML IJ SOLN
INTRAMUSCULAR | Status: DC | PRN
  Administered 2012-04-25: 30 mL via EPIDURAL

## 2012-04-25 MED ORDER — PROPOFOL 10 MG/ML IV BOLUS
INTRAVENOUS | Status: DC | PRN
  Administered 2012-04-25: 200 mg via INTRAVENOUS

## 2012-04-25 MED ORDER — OXYCODONE HCL 5 MG/5ML PO SOLN: 5.0000 mg | Freq: Once | ORAL | Status: DC | PRN

## 2012-04-25 MED ORDER — ONDANSETRON HCL 4 MG/2ML IJ SOLN
INTRAMUSCULAR | Status: DC | PRN
  Administered 2012-04-25: 4 mg via INTRAVENOUS

## 2012-04-25 MED ORDER — LACTATED RINGERS IV SOLN: INTRAVENOUS | Status: DC

## 2012-04-25 SURGICAL SUPPLY — 52 items
BANDAGE GAUZE 4  KLING STR (GAUZE/BANDAGES/DRESSINGS) IMPLANT
BANDAGE GAUZE ELAST BULKY 4 IN (GAUZE/BANDAGES/DRESSINGS) ×4 IMPLANT
BLADE AVERAGE 25X9 (BLADE) IMPLANT
BLADE MINI RND TIP GREEN BEAV (BLADE) IMPLANT
BNDG COHESIVE 1X5 TAN STRL LF (GAUZE/BANDAGES/DRESSINGS) IMPLANT
BNDG COHESIVE 3X5 TAN STRL LF (GAUZE/BANDAGES/DRESSINGS) ×2 IMPLANT
BNDG COHESIVE 6X5 TAN STRL LF (GAUZE/BANDAGES/DRESSINGS) ×2 IMPLANT
BNDG ESMARK 4X9 LF (GAUZE/BANDAGES/DRESSINGS) ×2 IMPLANT
BNDG GAUZE STRTCH 6 (GAUZE/BANDAGES/DRESSINGS) IMPLANT
CLOTH BEACON ORANGE TIMEOUT ST (SAFETY) ×2 IMPLANT
CORDS BIPOLAR (ELECTRODE) IMPLANT
COVER SURGICAL LIGHT HANDLE (MISCELLANEOUS) ×2 IMPLANT
CUFF TOURNIQUET SINGLE 18IN (TOURNIQUET CUFF) IMPLANT
CUFF TOURNIQUET SINGLE 24IN (TOURNIQUET CUFF) IMPLANT
CUFF TOURNIQUET SINGLE 34IN LL (TOURNIQUET CUFF) IMPLANT
CUFF TOURNIQUET SINGLE 44IN (TOURNIQUET CUFF) IMPLANT
DRAPE U-SHAPE 47X51 STRL (DRAPES) ×2 IMPLANT
DRSG ADAPTIC 3X8 NADH LF (GAUZE/BANDAGES/DRESSINGS) ×4 IMPLANT
DRSG PAD ABDOMINAL 8X10 ST (GAUZE/BANDAGES/DRESSINGS) ×2 IMPLANT
DURAPREP 26ML APPLICATOR (WOUND CARE) ×2 IMPLANT
ELECT REM PT RETURN 9FT ADLT (ELECTROSURGICAL) ×2
ELECTRODE REM PT RTRN 9FT ADLT (ELECTROSURGICAL) ×1 IMPLANT
GAUZE SPONGE 2X2 8PLY STRL LF (GAUZE/BANDAGES/DRESSINGS) IMPLANT
GLOVE BIO SURGEON STRL SZ 6.5 (GLOVE) ×2 IMPLANT
GLOVE BIOGEL PI IND STRL 6.5 (GLOVE) ×1 IMPLANT
GLOVE BIOGEL PI IND STRL 9 (GLOVE) ×1 IMPLANT
GLOVE BIOGEL PI INDICATOR 6.5 (GLOVE) ×1
GLOVE BIOGEL PI INDICATOR 9 (GLOVE) ×1
GLOVE SURG ORTHO 9.0 STRL STRW (GLOVE) ×2 IMPLANT
GOWN PREVENTION PLUS XLARGE (GOWN DISPOSABLE) ×2 IMPLANT
GOWN SRG XL XLNG 56XLVL 4 (GOWN DISPOSABLE) ×1 IMPLANT
GOWN STRL NON-REIN XL XLG LVL4 (GOWN DISPOSABLE) ×1
KIT BASIN OR (CUSTOM PROCEDURE TRAY) ×2 IMPLANT
KIT ROOM TURNOVER OR (KITS) ×2 IMPLANT
MANIFOLD NEPTUNE II (INSTRUMENTS) ×2 IMPLANT
NEEDLE HYPO 25GX1X1/2 BEV (NEEDLE) IMPLANT
NS IRRIG 1000ML POUR BTL (IV SOLUTION) ×2 IMPLANT
PACK ORTHO EXTREMITY (CUSTOM PROCEDURE TRAY) ×2 IMPLANT
PAD ARMBOARD 7.5X6 YLW CONV (MISCELLANEOUS) ×4 IMPLANT
PAD CAST 4YDX4 CTTN HI CHSV (CAST SUPPLIES) ×1 IMPLANT
PADDING CAST COTTON 4X4 STRL (CAST SUPPLIES) ×1
SPECIMEN JAR SMALL (MISCELLANEOUS) ×2 IMPLANT
SPONGE GAUZE 2X2 STER 10/PKG (GAUZE/BANDAGES/DRESSINGS)
SPONGE GAUZE 4X4 12PLY (GAUZE/BANDAGES/DRESSINGS) ×4 IMPLANT
SUCTION FRAZIER TIP 10 FR DISP (SUCTIONS) IMPLANT
SUT ETHILON 2 0 PSLX (SUTURE) IMPLANT
SUT VIC AB 2-0 FS1 27 (SUTURE) IMPLANT
SYR CONTROL 10ML LL (SYRINGE) IMPLANT
TOWEL OR 17X24 6PK STRL BLUE (TOWEL DISPOSABLE) ×2 IMPLANT
TOWEL OR 17X26 10 PK STRL BLUE (TOWEL DISPOSABLE) ×2 IMPLANT
TUBE CONNECTING 12X1/4 (SUCTIONS) ×2 IMPLANT
WATER STERILE IRR 1000ML POUR (IV SOLUTION) ×2 IMPLANT

## 2012-04-25 NOTE — Preoperative (Signed)
Beta Blockers   Reason not to administer Beta Blockers:Not Applicable 

## 2012-04-25 NOTE — H&P (Signed)
Beth Holden is an 67 y.o. female.   Chief Complaint: Osteomyelitis ulceration right foot third toe HPI: Patient is a 67 year old woman with diabetic insensate neuropathy who has developed progressive ulceration swelling osteomyelitis right foot third toe failed conservative therapy.  Past Medical History  Diagnosis Date  . Rhinitis, allergic   . Erosive esophagitis   . Peripheral neuropathy   . IBS (irritable bowel syndrome)   . Hyperlipemia     takes Crestor daily  . Acute appendicitis 01/05/2011  . ALLERGIC RHINITIS   . ALOPECIA NEC   . PONV (postoperative nausea and vomiting)   . Hypertension     takes Enalapril daily  . Seasonal allergies     takes Zyrtec daily  . History of bronchitis     last time a couple of yrs ago  . Headache     occasionally;r/t sinus   . Peripheral neuropathy   . Joint pain   . Osteomyelitis   . GERD (gastroesophageal reflux disease)     takes Nexium bid  . Constipation   . History of colon polyps   . Diabetes mellitus type II     takes Metformin and Flexpen daily  . DIABETES MELLITUS, TYPE II     Past Surgical History  Procedure Laterality Date  . Vaginal hysterectomy  2001  . Oophorectomy  2001  . Tubal ligation    . Rotator cuff repair      left  . Laparoscopic appendectomy  01/05/2011    Procedure: APPENDECTOMY LAPAROSCOPIC;  Surgeon: Valarie Merino, MD;  Location: [facility];  Service: General;  Laterality: N/A;  . Appendectomy  2012  . Colonoscopy    . Toe amputation  12/28/2011    rt second toe  . Amputation  12/28/2011    Procedure: AMPUTATION DIGIT;  Surgeon: Nadara Mustard, MD;  Location: [facility];  Service: Orthopedics;  Laterality: Right;  Right Foot 2nd Toe Amputation at MTP (metatarsophalangeal joint)    Family History  Problem Relation Age of Onset  . Lung cancer Mother 37  . Cancer Mother     lung  . Hypertension Father   . Hyperlipidemia Father   . Diabetes Father   . Coronary artery disease Father   . Dementia  Father   . Stomach cancer Paternal Aunt   . Cancer Paternal Aunt     stomach  . Brain cancer Paternal Uncle   . Cancer Paternal Uncle     brain  . Colon cancer Neg Hx   . Irritable bowel syndrome      Several family members on fathers side   . Diabetes Other   . Cancer Maternal Aunt     stomach   Social History:  reports that she has never smoked. She has never used smokeless tobacco. She reports that she does not drink alcohol or use illicit drugs.  Allergies:  Allergies  Allergen Reactions  . Codeine     Makes her crazy  . Propoxyphene Hcl Itching    No prescriptions prior to admission    Results for orders placed during the hospital encounter of 04/24/12 (from the past 48 hour(s))  SURGICAL PCR SCREEN     Status: None   Collection Time    04/24/12  9:32 AM      Result Value Range   MRSA, PCR NEGATIVE  NEGATIVE   Staphylococcus aureus NEGATIVE  NEGATIVE   Comment:            The Xpert SA Assay (  FDA     approved for NASAL specimens     in patients over 13 years of age),     is one component of     a comprehensive surveillance     program.  Test performance has     been validated by [employer] for patients greater     than or equal to 55 year old.     It is not intended     to diagnose infection nor to     guide or monitor treatment.  APTT     Status: None   Collection Time    04/24/12  9:32 AM      Result Value Range   aPTT 30  24 - 37 seconds  CBC     Status: Abnormal   Collection Time    04/24/12  9:32 AM      Result Value Range   WBC 10.5  4.0 - 10.5 K/uL   RBC 4.54  3.87 - 5.11 MIL/uL   Hemoglobin 11.8 (*) 12.0 - 15.0 g/dL   HCT 08.6 (*) 57.8 - 46.9 %   MCV 78.9  78.0 - 100.0 fL   MCH 26.0  26.0 - 34.0 pg   MCHC 33.0  30.0 - 36.0 g/dL   RDW 62.9  52.8 - 41.3 %   Platelets 448 (*) 150 - 400 K/uL  COMPREHENSIVE METABOLIC PANEL     Status: Abnormal   Collection Time    04/24/12  9:32 AM      Result Value Range   Sodium 138  135 - 145 mEq/L    Potassium 4.0  3.5 - 5.1 mEq/L   Chloride 101  96 - 112 mEq/L   CO2 29  19 - 32 mEq/L   Glucose, Bld 161 (*) 70 - 99 mg/dL   BUN 16  6 - 23 mg/dL   Creatinine, Ser 2.44  0.50 - 1.10 mg/dL   Calcium 9.8  8.4 - 01.0 mg/dL   Total Protein 7.9  6.0 - 8.3 g/dL   Albumin 3.6  3.5 - 5.2 g/dL   AST 27  0 - 37 U/L   ALT 19  0 - 35 U/L   Alkaline Phosphatase 56  39 - 117 U/L   Total Bilirubin 0.2 (*) 0.3 - 1.2 mg/dL   GFR calc non Af Amer >90  >90 mL/min   GFR calc Af Amer >90  >90 mL/min   Comment:            The eGFR has been calculated     using the CKD EPI equation.     This calculation has not been     validated in all clinical     situations.     eGFR's persistently     <90 mL/min signify     possible Chronic Kidney Disease.  PROTIME-INR     Status: None   Collection Time    04/24/12  9:32 AM      Result Value Range   Prothrombin Time 12.9  11.6 - 15.2 seconds   INR 0.98  0.00 - 1.49   Dg Chest 2 View  04/24/2012  *RADIOLOGY REPORT*  Clinical Data: Preop evaluation.  History of diabetes and hypertension.  CHEST - 2 VIEW  Comparison: 12/26/2011.  Findings: Cardiac silhouette is upper normal size.  Mediastinal and hilar contours appear stable.  No pulmonary infiltrates or masses are seen.  The minimal central peribronchial thickening appears stable.  No pulmonary infiltrates or masses are seen. No pleural abnormality is evident.  There is minimal degenerative spondylosis.  IMPRESSION: No acute cardiopulmonary or pleural disease is seen.  Stable minimal central peribronchial thickening is present.   Original Report Authenticated By: Onalee Hua Call     Review of Systems  All other systems reviewed and are negative.    There were no vitals taken for this visit. Physical Exam  On examination patient has palpable dorsalis pedis and posterior tibial pulse she has sausage digit swelling of the third toe with ulceration which probes to bone with radiographs showing osteomyelitis of the  third toe Assessment/Plan Assessment: Osteomyelitis ulceration right foot third toe.  Plan: Will plan for third toe amputation at the MTP joint. Risks and benefits were discussed including infection neurovascular injury persistent pain need for additional surgery. Patient states she understands and wished to proceed at this time.  DUDA,MARCUS V 04/25/2012, 6:30 AM

## 2012-04-25 NOTE — Anesthesia Procedure Notes (Addendum)
Anesthesia Regional Block:  Ankle block  Pre-Anesthetic Checklist: ,, timeout performed, Correct Patient, Correct Site, Correct Laterality, Correct Procedure, Correct Position, site marked, Risks and benefits discussed,  Surgical consent,  Pre-op evaluation,  At surgeon's request and post-op pain management  Laterality: Right  Prep: chloraprep       Needles:  Injection technique: Single-shot  Needle Type: Other   (#25 ga Quincke)    Needle Gauge: 25 and 25 G    Additional Needles:  Procedures: other  (perineural infiltration) Ankle block Narrative:  Start time: 04/25/2012 9:23 AM End time: 04/25/2012 9:30 AM Injection made incrementally with aspirations every 5 mL.  Performed by: Personally  Anesthesiologist: Sandford Craze, MD  Additional Notes: Pt identified in Holding room.  Monitors applied. Working IV access confirmed. Sterile prep R ankle.  #25ga perineural infiltration of Ropivacaine around deep and sup peroneal, saph, sural, and post tib nerves. Total 35cc 0.5% Ropivacaine injected incrementally.  Patient asymptomatic, VSS, no heme aspirated, tolerated well.  Sandford Craze, MD   Procedure Name: LMA Insertion Date/Time: 04/25/2012 10:12 AM Performed by: Carmela Rima Pre-anesthesia Checklist: Patient identified, Timeout performed, Emergency Drugs available, Suction available and Patient being monitored Patient Re-evaluated:Patient Re-evaluated prior to inductionOxygen Delivery Method: Circle system utilized Preoxygenation: Pre-oxygenation with 100% oxygen Intubation Type: IV induction Ventilation: Mask ventilation without difficulty LMA: LMA inserted LMA Size: 4.0 Number of attempts: 1 Tube secured with: Tape Dental Injury: Teeth and Oropharynx as per pre-operative assessment

## 2012-04-25 NOTE — Telephone Encounter (Signed)
FYI

## 2012-04-25 NOTE — Anesthesia Preprocedure Evaluation (Addendum)
Anesthesia Evaluation  Patient identified by MRN, date of birth, ID band Patient awake    Reviewed: Allergy & Precautions, H&P , NPO status , Patient's Chart, lab work & pertinent test results  History of Anesthesia Complications (+) PONV  Airway Mallampati: I TM Distance: >3 FB Neck ROM: Full    Dental  (+) Teeth Intact and Dental Advisory Given   Pulmonary neg pulmonary ROS,  breath sounds clear to auscultation  Pulmonary exam normal       Cardiovascular hypertension, Pt. on medications Rhythm:Regular Rate:Normal     Neuro/Psych  Neuromuscular disease (diabetic peripheral neuropathy)    GI/Hepatic GERD-  Medicated and Controlled,  Endo/Other  diabetes (glu 133), Well Controlled, Type 2, Insulin DependentMorbid obesity  Renal/GU      Musculoskeletal   Abdominal (+) + obese,   Peds  Hematology  (+) Blood dyscrasia (Hb 11.1), anemia ,   Anesthesia Other Findings   Reproductive/Obstetrics                           Anesthesia Physical Anesthesia Plan  ASA: III  Anesthesia Plan: MAC and Regional   Post-op Pain Management:    Induction:   Airway Management Planned: Natural Airway and Simple Face Mask  Additional Equipment:   Intra-op Plan:   Post-operative Plan:   Informed Consent: I have reviewed the patients History and Physical, chart, labs and discussed the procedure including the risks, benefits and alternatives for the proposed anesthesia with the patient or authorized representative who has indicated his/her understanding and acceptance.   Dental advisory given and Dental Advisory Given  Plan Discussed with: CRNA, Surgeon and Anesthesiologist  Anesthesia Plan Comments: (Plan routine monitors, deep MAC with ankle block )       Anesthesia Quick Evaluation

## 2012-04-25 NOTE — Discharge Summary (Signed)
Discharge to home °

## 2012-04-25 NOTE — Anesthesia Postprocedure Evaluation (Signed)
Anesthesia Post Note  [name]  Procedure(s) Performed: Procedure(s) (LRB): Right Foot 3rd Toe Amputation (Right)  Anesthesia type: General  Patient location: PACU  Post pain: Pain level controlled and Adequate analgesia  Post assessment: Post-op Vital signs reviewed, Patient's Cardiovascular Status Stable, Respiratory Function Stable, Patent Airway and Pain level controlled  Last Vitals:  Filed Vitals:   04/25/12 1043  BP: 130/70  Pulse: 97  Temp: 36.3 C  Resp: 20    Post vital signs: Reviewed and stable  Level of consciousness: awake, alert  and oriented  Complications: No apparent anesthesia complications

## 2012-04-25 NOTE — Op Note (Signed)
OPERATIVE REPORT  DATE OF SURGERY: 04/25/2012  PATIENT:  Beth Holden,  67 y.o. female  PRE-OPERATIVE DIAGNOSIS:  Osteomyelitis Right Foot 3rd Toe  POST-OPERATIVE DIAGNOSIS:  Right foot third toe osteomyelitis and ulceration  PROCEDURE:  Procedure(s): Right Foot 3rd Toe Amputation  SURGEON:  Surgeon(s): Nadara Mustard, MD  ANESTHESIA:   regional and general  EBL:  Minimal ML  SPECIMEN:  No Specimen  TOURNIQUET:    PROCEDURE DETAILS: Patient is a 67 year old woman with diabetic insensate neuropathy with osteomyelitis ulceration right foot third toe she has failed conservative therapy and presents at this time for third toe amputation. Risks and benefits were discussed including infection neurovascular injury need for additional surgery. Patient states she understands and wished to proceed at this time. Description of procedure patient brought to the operating room and underwent a general anesthetic after an ankle block. After adequate levels and anesthesia were obtained patient's right lower extremity was prepped using DuraPrep and draped into a sterile field. An incision was made just distal to the MTP joint of the right third toe. The right third toe was amputated through the MTP joint. Hemostasis was obtained the wounds irrigated normal saline. The incision was closed using 2-0 nylon. The wound was covered with Adaptic orthopedic sponges AB dressing Kerlix and Coban. Patient was extubated taken to the PACU in stable condition.  PLAN OF CARE: Discharge to home after PACU  PATIENT DISPOSITION:  PACU - hemodynamically stable.   Nadara Mustard, MD 04/25/2012 10:40 AM

## 2012-04-25 NOTE — Transfer of Care (Signed)
Immediate Anesthesia Transfer of Care Note  [name]  Procedure(s) Performed: Procedure(s) with comments: Right Foot 3rd Toe Amputation (Right) - Right Foot Third Toe Amputation   Patient Location: PACU  Anesthesia Type:General  Level of Consciousness: awake, alert  and oriented  Airway & Oxygen Therapy: Patient Spontanous Breathing and Patient connected to nasal cannula oxygen  Post-op Assessment: Report given to PACU RN, Post -op Vital signs reviewed and stable and Patient moving all extremities X 4  Post vital signs: Reviewed and stable  Complications: No apparent anesthesia complications

## 2012-04-26 ENCOUNTER — Encounter (HOSPITAL_COMMUNITY): Payer: Self-pay | Admitting: Orthopedic Surgery

## 2012-05-18 ENCOUNTER — Encounter: Payer: Self-pay | Admitting: *Deleted

## 2012-05-18 ENCOUNTER — Ambulatory Visit (INDEPENDENT_AMBULATORY_CARE_PROVIDER_SITE_OTHER): Payer: BC Managed Care – PPO | Admitting: Internal Medicine

## 2012-05-18 ENCOUNTER — Encounter: Payer: Self-pay | Admitting: Internal Medicine

## 2012-05-18 ENCOUNTER — Encounter: Payer: BC Managed Care – PPO | Attending: Internal Medicine | Admitting: *Deleted

## 2012-05-18 VITALS — Ht 65.5 in | Wt 186.8 lb

## 2012-05-18 VITALS — BP 122/72 | HR 106 | Temp 98.3°F | Resp 12 | Ht 65.5 in | Wt 187.0 lb

## 2012-05-18 DIAGNOSIS — Z794 Long term (current) use of insulin: Secondary | ICD-10-CM | POA: Insufficient documentation

## 2012-05-18 DIAGNOSIS — E119 Type 2 diabetes mellitus without complications: Secondary | ICD-10-CM | POA: Insufficient documentation

## 2012-05-18 DIAGNOSIS — Z713 Dietary counseling and surveillance: Secondary | ICD-10-CM | POA: Insufficient documentation

## 2012-05-18 DIAGNOSIS — E1149 Type 2 diabetes mellitus with other diabetic neurological complication: Secondary | ICD-10-CM

## 2012-05-18 NOTE — Progress Notes (Signed)
Subjective:     Patient ID: Beth Holden, female   DOB: 12/26/1945, 67 y.o.   MRN: [id number]  HPI Beth Holden is a pleasant 67 y/o woman returning for f/u for DM2, dx ~2000, uncontrolled, insulin-dependent, with complications (diabetic peripheral neuropathy, toe amputation x 2 - after infected diabetic toe ulcers). Last visit 1 mo ago.  Patient has had a second right toe amputated on 12/2011, and she just had her third toe amputated for osteomyelitis on 04/25/2012. I reviewed her sugars from the last hospitalization and they were in the 100s.  Last HbA1C was 10.1% on 03/05/2012, but previously 12.6%. She is on: - Metformin 1000 mg bid - Levemir 47 units bid - Januvia 100 mg daily, started at last visit At last visit, we discussed about starting her on a VGo mechanical pump. She has an appointment with diabetes education later today. She is asking me whether I absolutely think that she needs it.  She checks her sugars 1x a day and they are much better: - am: 95 - 200 >> 86-139, one sugar 154 and one 189, lowest 73 x 1 - later 138-160 - bedtime: 200s Lowest: 73 x 1. Has hypoglycemia in the 70's.   She has a snack: crackers + cheese at nighttime. She usually gets hungry around that time. Dinner is at 6-7 pm. She mentions that she sometimes she feels shaky late after dinner if she does not eat a snack. She is not walking anymore >> 10 lbs in last year.  Her last eye exam in 11/2011: no DR. No CKD. Has numbness and tingling in feet. She is on Gabapentin and Amitriptyline, but feels that these are not helping. She has neuropathic pain (lightning stabs of pain).   Review of Systems Constitutional: + weight loss, no fatigue, no subjective hyperthermia/hypothermia Eyes: no blurry vision, no xerophthalmia ENT: no sore throat, no nodules palpated in throat, no dysphagia/odynophagia, no hoarseness Cardiovascular: no CP/SOB/palpitations/leg swelling Respiratory: no cough/SOB Gastrointestinal: no  N/V/D/C Musculoskeletal: + muscle but no joint aches Skin: no rashes, + hair loss Neurological: no tremors/numbness/tingling/dizziness Psychiatric: no depression/anxiety  low libido  I reviewed pt's medications, allergies, PMH, social hx, family hx and no changes required, except as above.  Objective:   Physical Exam BP 122/72  Pulse 106  Temp(Src) 98.3 F (36.8 C) (Oral)  Resp 12  Ht 5' 5.5" (1.664 m)  Wt 187 lb (84.823 kg)  BMI 30.63 kg/m2  SpO2 96% Wt Readings from Last 3 Encounters:  05/18/12 187 lb (84.823 kg)  04/24/12 187 lb 9.6 oz (85.095 kg)  04/13/12 191 lb (86.637 kg)   Constitutional: slightly overweight, in NAD Eyes: PERRLA, EOMI, no exophthalmos ENT: moist mucous membranes, no thyromegaly, no cervical lymphadenopathy Cardiovascular: RRR, No MRG Respiratory: CTA B Gastrointestinal: abdomen soft, NT, ND, BS+ Musculoskeletal: no deformities, strength intact in all 4; missing second right toe Skin: moist, warm, no rashes, left foot in boot, with affected toe bandaged Neurological: no tremor with outstretched hands, DTR normal in all 4 Foot exam performed today  Assessment:     1. DM2, uncontrolled, insulin-dependent, with complications - diabetic peripheral neuropathy - 2 toe amputations - after infected diabetic toe ulcers  2. Diabetic peripheral neuropathy    Plan:     1. DM2 Patient with long-standing diabetes, poorly controlled, with history of 2 toes amputated recently. Patient is on a high dose of basal insulin, but no mealtime insulin. Her sugars are much improved since last visit, without any lowest or  sugars higher than low 200s. Her sugars in the morning are at goal, however they increase especially 2 hours after dinner and at bedtime. - We discussed about the fact that he does not necessarily need to eat a snack at bedtime and to try to reduce the number of cars with dinner - She is tolerating the Januvia well, and she continues this along with  metformin and Levemir 47 units twice a day - I think she would be a great candidate for a VGo system, and at last visit we discussed about this, and I suggested that she try status to get better control of her diabetes and I anticipate that she will require much less insulin with this system. I believe that we can start with a 30 units reservoir. We can start with a basal rate only initially, and then add bolusing. She was asking me whether absolutely thing that she needs this, and I do not. There are other things that we can try, this was just an idea to improve her diabetes management and to reduce the amount of insulin she is using. - She will have an appointment with nutrition/diabetes education today - for now, we'll continue the same regimen, however, if she does not start the VGo system, and her evening sugars remain elevated, we discussed about adding rapid-acting insulin with dinner - return to clinic in 2 months with her sugar log  2. Peripheral neuropathy - The patient would like to come off amitriptyline and gabapentin, since she feels that these are not helping.  She try to, gabapentin for 5 days, however the pain became severe and she had to restart - She did not try the alpha lipoic acid and B vitamin complex yet. I advised her to try to do this.

## 2012-05-18 NOTE — Progress Notes (Signed)
  Medical Nutrition Therapy:  Appt start time: 0830 end time:  0930.  Assessment:  Primary concerns today: patient here to learn about V-Go insulin delivery system. She states history of diabetes since 2002 and has recently had 2 toes amputated due to infection and poor BG management. She has had diabetes education several years ago. She is currently taking Levemir @ 47 units BID and SMBG typically pre breakfast and occasionally after lunch or supper. The post supper meals are typically in the 200's now.  MEDICATIONS: see list  Usual physical activity: limited due to recent toe amputations.  Progress Towards Goal(s):  In progress.   Nutritional Diagnosis:  NB-1.1 Food and nutrition-related knowledge deficit As related to diabetes management .  As evidenced by A1c of 10.1 % on 03/05/2012    Intervention: Spoke to patient about insulin action of Levemir in comparison to analog insulins used in V-go system. Also discussed rationale of having a basal delivery in addition to meal time and/or correction insulin. Demonstrated use of V-go device and that it is filled once a day. Explained that delivery of bolus insulin is in 2 unit increments. We were not sure of where she would obtain the V-go with her insurance requiring her Rx's to come from [place] so I contacted the Rep and he is to get back with me on that question. Patient plans to think about her option of using the V-go or add a meal time injection of analog insulin at dinner meal. She is to communicate with Dr. Elvera Lennox once she makes up her mind and to contact this office if she decides on V-go so training can be scheduled . Plan: Please check your BG after breakfast and after lunch at least once a week Continue checking BG after supper once or twice a week I will email you the answer to your question about where to pick up the V-Go in reference to [facility]   Handouts given during visit include:  Intensive Insulin handout  V-go Website  flyer  Monitoring/Evaluation:  Patient to contact this office if she decides on V-go so training can be scheduled.

## 2012-05-18 NOTE — Patient Instructions (Addendum)
Plan: Please check your BG after breakfast and after lunch at least once a week Continue checking BG after supper once or twice a week I will email you the answer to your question about where to pick up the V-Go in reference to [facility]

## 2012-05-18 NOTE — Patient Instructions (Addendum)
Please continue current regimen. Limit snacks at night and try to reduce the nr of carbs for dinner. Please consider the VGo system. Can try the following combination for neuropathy: - alpha-lipoic acid 600 mg twice a day - Metanx (can get it cheaper from [employer] online) 1 tablet twice a day If Metanx is too expensive, can get B complex 1 tablet once a day

## 2012-05-20 ENCOUNTER — Other Ambulatory Visit: Payer: Self-pay | Admitting: Internal Medicine

## 2012-05-21 ENCOUNTER — Other Ambulatory Visit: Payer: Self-pay | Admitting: *Deleted

## 2012-05-21 MED ORDER — SITAGLIPTIN PHOSPHATE 100 MG PO TABS: 100.0000 mg | ORAL_TABLET | Freq: Every day | ORAL | Status: DC

## 2012-05-21 NOTE — Telephone Encounter (Signed)
Pt called requesting rx refill for Januvia to be sent to [facility]. Done.

## 2012-05-28 ENCOUNTER — Other Ambulatory Visit: Payer: Self-pay | Admitting: Internal Medicine

## 2012-06-08 ENCOUNTER — Other Ambulatory Visit: Payer: Self-pay | Admitting: Internal Medicine

## 2012-07-06 ENCOUNTER — Encounter: Payer: Self-pay | Admitting: Internal Medicine

## 2012-07-18 ENCOUNTER — Encounter (HOSPITAL_COMMUNITY)
Admission: RE | Admit: 2012-07-18 | Discharge: 2012-07-18 | Disposition: A | Discharge status: Home / Self Care | Payer: BC Managed Care – PPO | Source: Ambulatory Visit | Attending: Orthopedic Surgery | Admitting: Orthopedic Surgery

## 2012-07-18 ENCOUNTER — Encounter (HOSPITAL_COMMUNITY): Payer: Self-pay

## 2012-07-18 ENCOUNTER — Telehealth: Payer: Self-pay | Admitting: *Deleted

## 2012-07-18 ENCOUNTER — Other Ambulatory Visit (HOSPITAL_COMMUNITY): Payer: Self-pay | Admitting: Orthopedic Surgery

## 2012-07-18 DIAGNOSIS — Z01812 Encounter for preprocedural laboratory examination: Secondary | ICD-10-CM | POA: Insufficient documentation

## 2012-07-18 DIAGNOSIS — I10 Essential (primary) hypertension: Secondary | ICD-10-CM

## 2012-07-18 DIAGNOSIS — E1149 Type 2 diabetes mellitus with other diabetic neurological complication: Secondary | ICD-10-CM

## 2012-07-18 DIAGNOSIS — E785 Hyperlipidemia, unspecified: Secondary | ICD-10-CM

## 2012-07-18 DIAGNOSIS — D649 Anemia, unspecified: Secondary | ICD-10-CM

## 2012-07-18 DIAGNOSIS — Z01818 Encounter for other preprocedural examination: Secondary | ICD-10-CM | POA: Insufficient documentation

## 2012-07-18 HISTORY — DX: Insomnia, unspecified: G47.00

## 2012-07-18 HISTORY — DX: Diverticulosis of intestine, part unspecified, without perforation or abscess without bleeding: K57.90

## 2012-07-18 LAB — CBC
HCT: 33.1 % — ABNORMAL LOW (ref 36.0–46.0)
Hemoglobin: 11 g/dL — ABNORMAL LOW (ref 12.0–15.0)
MCH: 26.3 pg (ref 26.0–34.0)
MCHC: 33.2 g/dL (ref 30.0–36.0)
MCV: 79.2 fL (ref 78.0–100.0)
Platelets: 418 10*3/uL — ABNORMAL HIGH (ref 150–400)
RBC: 4.18 MIL/uL (ref 3.87–5.11)
RDW: 13.5 % (ref 11.5–15.5)
WBC: 9.5 10*3/uL (ref 4.0–10.5)

## 2012-07-18 LAB — BASIC METABOLIC PANEL
BUN: 16 mg/dL (ref 6–23)
CO2: 27 mEq/L (ref 19–32)
Calcium: 10 mg/dL (ref 8.4–10.5)
Chloride: 101 mEq/L (ref 96–112)
Creatinine, Ser: 0.59 mg/dL (ref 0.50–1.10)
GFR calc Af Amer: >90 mL/min (ref >90)
GFR calc non Af Amer: >90 mL/min (ref >90)
Glucose, Bld: 247 mg/dL — ABNORMAL HIGH (ref 70–99)
Potassium: 3.6 mEq/L (ref 3.5–5.1)
Sodium: 138 mEq/L (ref 135–145)

## 2012-07-18 NOTE — Pre-Procedure Instructions (Signed)
Beth Holden  07/18/2012   Your procedure is scheduled on:  Fri, July 18 @ 1:15 PM  Report to [facility] at 11:15 AM.  Call this number if you have problems the morning of surgery: [phone]   Remember:   Do not eat food or drink liquids after midnight.   Take these medicines the morning of surgery with A SIP OF WATER: Cetirizine(Zyrtec),Esomeprazole(Nexium),Gabapentin(Neurontin),Pain Pill(if needed)              No Aspirin,Goody's,BC's,Aleve,Ibuprofen,Fish Oil,or any Herbal Medications   Do not wear jewelry, make-up or nail polish.  Do not wear lotions, powders, or perfumes. You may wear deodorant.  Do not shave 48 hours prior to surgery.  Do not bring valuables to the hospital.  [facility] is not responsible                   for any belongings or valuables.  Contacts, dentures or bridgework may not be worn into surgery.  Leave suitcase in the car. After surgery it may be brought to your room.  For patients admitted to the hospital, checkout time is 11:00 AM the day of  discharge.   Patients discharged the day of surgery will not be allowed to drive  home.    Special Instructions: Shower using CHG 2 nights before surgery and the night before surgery.  If you shower the day of surgery use CHG.  Use special wash - you have one bottle of CHG for all showers.  You should use approximately 1/3 of the bottle for each shower.   Please read over the following fact sheets that you were given: Pain Booklet, Coughing and Deep Breathing and Surgical Site Infection Prevention

## 2012-07-18 NOTE — Telephone Encounter (Signed)
Pt called states she received a letter from her insurance states she has not had a Cholesterol level this year.  She is requesting to have this drawn.  She further states she is having leg and arm pain she would like to know if this is a result of the Crestor she is taking.  Please advise.

## 2012-07-18 NOTE — Telephone Encounter (Signed)
Last annual exam Jan 28, '13. Recommend visit. Labs are ordered.

## 2012-07-18 NOTE — Telephone Encounter (Signed)
Pt scheduled appointment for 9.15.2014 for PE.

## 2012-07-18 NOTE — Progress Notes (Addendum)
Pt doesn't have a cardiologist  Dr.Norins is Medical Md  Echo and stress >51yrs ago;per pt everything normal  EKG and CXR in epic from 4-15/14

## 2012-07-18 NOTE — Progress Notes (Signed)
Requested orders from [place] at [facility] office

## 2012-07-20 ENCOUNTER — Ambulatory Visit: Payer: BC Managed Care – PPO | Admitting: Internal Medicine

## 2012-07-23 ENCOUNTER — Telehealth: Payer: Self-pay | Admitting: *Deleted

## 2012-07-23 NOTE — Telephone Encounter (Signed)
I am not sure where this message originates. Reviewed most recent lab: Creatinine is normal, estimated GFR is normal.  Diabetics are always at risk but the tests I can find do not indicate any kidney disease.

## 2012-07-23 NOTE — Telephone Encounter (Signed)
Pt called states on her MY chart massage from her recent Labs; it is stated that she has Possible Chronic Kidney Disease.  She states this is the first time she has seen this on her chart.  She wants to know if there is any other labs she should have in addition to the ones you have already ordered.  Please advise pt.

## 2012-07-24 ENCOUNTER — Other Ambulatory Visit: Payer: Self-pay | Admitting: *Deleted

## 2012-07-24 NOTE — Telephone Encounter (Signed)
Spoke with pt advised pt of MDs message

## 2012-07-25 ENCOUNTER — Encounter (HOSPITAL_COMMUNITY): Payer: Self-pay | Admitting: Pharmacy Technician

## 2012-07-26 MED ORDER — CEFAZOLIN SODIUM-DEXTROSE 2-3 GM-% IV SOLR
2.0000 g | INTRAVENOUS | Status: AC
  Administered 2012-07-27: 2 g via INTRAVENOUS
  Filled 2012-07-26: qty 50

## 2012-07-27 ENCOUNTER — Encounter (HOSPITAL_COMMUNITY): Payer: Self-pay | Admitting: *Deleted

## 2012-07-27 ENCOUNTER — Encounter (HOSPITAL_COMMUNITY)
Admission: RE | Disposition: A | Discharge status: Home / Self Care | Payer: Self-pay | Source: Ambulatory Visit | Attending: Orthopedic Surgery

## 2012-07-27 ENCOUNTER — Encounter (HOSPITAL_COMMUNITY): Payer: Self-pay | Admitting: Anesthesiology

## 2012-07-27 ENCOUNTER — Ambulatory Visit: Payer: BC Managed Care – PPO | Admitting: Internal Medicine

## 2012-07-27 ENCOUNTER — Ambulatory Visit (HOSPITAL_COMMUNITY)
Admission: RE | Admit: 2012-07-27 | Discharge: 2012-07-27 | Disposition: A | Discharge status: Home / Self Care | Payer: BC Managed Care – PPO | Source: Ambulatory Visit | Attending: Orthopedic Surgery | Admitting: Orthopedic Surgery

## 2012-07-27 ENCOUNTER — Ambulatory Visit (HOSPITAL_COMMUNITY): Payer: BC Managed Care – PPO | Admitting: Anesthesiology

## 2012-07-27 DIAGNOSIS — K219 Gastro-esophageal reflux disease without esophagitis: Secondary | ICD-10-CM | POA: Insufficient documentation

## 2012-07-27 DIAGNOSIS — K209 Esophagitis, unspecified without bleeding: Secondary | ICD-10-CM | POA: Insufficient documentation

## 2012-07-27 DIAGNOSIS — J301 Allergic rhinitis due to pollen: Secondary | ICD-10-CM | POA: Insufficient documentation

## 2012-07-27 DIAGNOSIS — Z888 Allergy status to other drugs, medicaments and biological substances status: Secondary | ICD-10-CM | POA: Insufficient documentation

## 2012-07-27 DIAGNOSIS — I10 Essential (primary) hypertension: Secondary | ICD-10-CM | POA: Insufficient documentation

## 2012-07-27 DIAGNOSIS — M86171 Other acute osteomyelitis, right ankle and foot: Secondary | ICD-10-CM

## 2012-07-27 DIAGNOSIS — Z885 Allergy status to narcotic agent status: Secondary | ICD-10-CM | POA: Insufficient documentation

## 2012-07-27 DIAGNOSIS — J40 Bronchitis, not specified as acute or chronic: Secondary | ICD-10-CM | POA: Insufficient documentation

## 2012-07-27 DIAGNOSIS — Z79899 Other long term (current) drug therapy: Secondary | ICD-10-CM | POA: Insufficient documentation

## 2012-07-27 DIAGNOSIS — Z794 Long term (current) use of insulin: Secondary | ICD-10-CM | POA: Insufficient documentation

## 2012-07-27 DIAGNOSIS — M9080 Osteopathy in diseases classified elsewhere, unspecified site: Secondary | ICD-10-CM | POA: Insufficient documentation

## 2012-07-27 DIAGNOSIS — K5730 Diverticulosis of large intestine without perforation or abscess without bleeding: Secondary | ICD-10-CM | POA: Insufficient documentation

## 2012-07-27 DIAGNOSIS — Z8711 Personal history of peptic ulcer disease: Secondary | ICD-10-CM | POA: Insufficient documentation

## 2012-07-27 DIAGNOSIS — M869 Osteomyelitis, unspecified: Secondary | ICD-10-CM | POA: Insufficient documentation

## 2012-07-27 DIAGNOSIS — L659 Nonscarring hair loss, unspecified: Secondary | ICD-10-CM | POA: Insufficient documentation

## 2012-07-27 DIAGNOSIS — G4700 Insomnia, unspecified: Secondary | ICD-10-CM | POA: Insufficient documentation

## 2012-07-27 DIAGNOSIS — G609 Hereditary and idiopathic neuropathy, unspecified: Secondary | ICD-10-CM | POA: Insufficient documentation

## 2012-07-27 DIAGNOSIS — E1169 Type 2 diabetes mellitus with other specified complication: Secondary | ICD-10-CM | POA: Insufficient documentation

## 2012-07-27 DIAGNOSIS — E785 Hyperlipidemia, unspecified: Secondary | ICD-10-CM | POA: Insufficient documentation

## 2012-07-27 DIAGNOSIS — L97509 Non-pressure chronic ulcer of other part of unspecified foot with unspecified severity: Secondary | ICD-10-CM | POA: Insufficient documentation

## 2012-07-27 DIAGNOSIS — K589 Irritable bowel syndrome without diarrhea: Secondary | ICD-10-CM | POA: Insufficient documentation

## 2012-07-27 DIAGNOSIS — G709 Myoneural disorder, unspecified: Secondary | ICD-10-CM | POA: Insufficient documentation

## 2012-07-27 HISTORY — PX: AMPUTATION: SHX166

## 2012-07-27 LAB — GLUCOSE, CAPILLARY
Glucose-Capillary: 117 mg/dL — ABNORMAL HIGH (ref 70–99)
Glucose-Capillary: 88 mg/dL (ref 70–99)
Glucose-Capillary: 84 mg/dL (ref 70–99)

## 2012-07-27 LAB — PROTIME-INR
INR: 0.98 (ref 0.00–1.49)
Prothrombin Time: 12.8 seconds (ref 11.6–15.2)

## 2012-07-27 LAB — APTT: aPTT: 31 seconds (ref 24–37)

## 2012-07-27 SURGERY — AMPUTATION DIGIT
Anesthesia: General | Site: Foot | Laterality: Right | Wound class: Dirty or Infected

## 2012-07-27 MED ORDER — PROPOFOL 10 MG/ML IV BOLUS
INTRAVENOUS | Status: DC | PRN
  Administered 2012-07-27: 150 mg via INTRAVENOUS

## 2012-07-27 MED ORDER — ONDANSETRON HCL 4 MG/2ML IJ SOLN
INTRAMUSCULAR | Status: DC | PRN
  Administered 2012-07-27: 4 mg via INTRAVENOUS

## 2012-07-27 MED ORDER — LACTATED RINGERS IV SOLN
INTRAVENOUS | Status: DC | PRN
  Administered 2012-07-27: 13:00:00 via INTRAVENOUS

## 2012-07-27 MED ORDER — FENTANYL CITRATE 0.05 MG/ML IJ SOLN
INTRAMUSCULAR | Status: AC
  Filled 2012-07-27: qty 2

## 2012-07-27 MED ORDER — ONDANSETRON HCL 4 MG/2ML IJ SOLN: 4.0000 mg | Freq: Four times a day (QID) | INTRAMUSCULAR | Status: DC | PRN

## 2012-07-27 MED ORDER — FENTANYL CITRATE 0.05 MG/ML IJ SOLN
25.0000 ug | INTRAMUSCULAR | Status: DC | PRN
  Administered 2012-07-27: 50 ug via INTRAVENOUS

## 2012-07-27 MED ORDER — 0.9 % SODIUM CHLORIDE (POUR BTL) OPTIME
TOPICAL | Status: DC | PRN
  Administered 2012-07-27: 1000 mL

## 2012-07-27 MED ORDER — DEXAMETHASONE SODIUM PHOSPHATE 10 MG/ML IJ SOLN
INTRAMUSCULAR | Status: DC | PRN
  Administered 2012-07-27: 8 mg via INTRAVENOUS

## 2012-07-27 MED ORDER — MIDAZOLAM HCL 5 MG/5ML IJ SOLN
INTRAMUSCULAR | Status: DC | PRN
  Administered 2012-07-27: 2 mg via INTRAVENOUS

## 2012-07-27 MED ORDER — HYDROCODONE-ACETAMINOPHEN 5-325 MG PO TABS: 1.0000 | ORAL_TABLET | Freq: Four times a day (QID) | ORAL | Status: DC | PRN

## 2012-07-27 MED ORDER — FENTANYL CITRATE 0.05 MG/ML IJ SOLN
INTRAMUSCULAR | Status: DC | PRN
  Administered 2012-07-27: 50 ug via INTRAVENOUS

## 2012-07-27 MED ORDER — LIDOCAINE HCL (CARDIAC) 20 MG/ML IV SOLN
INTRAVENOUS | Status: DC | PRN
  Administered 2012-07-27: 100 mg via INTRAVENOUS

## 2012-07-27 SURGICAL SUPPLY — 31 items
BANDAGE GAUZE ELAST BULKY 4 IN (GAUZE/BANDAGES/DRESSINGS) ×2 IMPLANT
BNDG COHESIVE 4X5 TAN STRL (GAUZE/BANDAGES/DRESSINGS) ×2 IMPLANT
BNDG ESMARK 4X9 LF (GAUZE/BANDAGES/DRESSINGS) IMPLANT
CLOTH BEACON ORANGE TIMEOUT ST (SAFETY) ×2 IMPLANT
COVER SURGICAL LIGHT HANDLE (MISCELLANEOUS) ×2 IMPLANT
DRAPE U-SHAPE 47X51 STRL (DRAPES) ×2 IMPLANT
DRSG ADAPTIC 3X8 NADH LF (GAUZE/BANDAGES/DRESSINGS) ×2 IMPLANT
DURAPREP 26ML APPLICATOR (WOUND CARE) ×2 IMPLANT
ELECT REM PT RETURN 9FT ADLT (ELECTROSURGICAL) ×2
ELECTRODE REM PT RTRN 9FT ADLT (ELECTROSURGICAL) ×1 IMPLANT
GLOVE BIOGEL PI IND STRL 9 (GLOVE) ×1 IMPLANT
GLOVE BIOGEL PI INDICATOR 9 (GLOVE) ×1
GLOVE SURG ORTHO 9.0 STRL STRW (GLOVE) ×2 IMPLANT
GOWN PREVENTION PLUS XLARGE (GOWN DISPOSABLE) ×2 IMPLANT
GOWN SRG XL XLNG 56XLVL 4 (GOWN DISPOSABLE) ×1 IMPLANT
GOWN STRL NON-REIN XL XLG LVL4 (GOWN DISPOSABLE) ×1
KIT BASIN OR (CUSTOM PROCEDURE TRAY) ×2 IMPLANT
KIT ROOM TURNOVER OR (KITS) ×2 IMPLANT
MANIFOLD NEPTUNE II (INSTRUMENTS) ×2 IMPLANT
NEEDLE 22X1 1/2 (OR ONLY) (NEEDLE) IMPLANT
NS IRRIG 1000ML POUR BTL (IV SOLUTION) ×2 IMPLANT
PACK ORTHO EXTREMITY (CUSTOM PROCEDURE TRAY) ×2 IMPLANT
PAD ARMBOARD 7.5X6 YLW CONV (MISCELLANEOUS) ×4 IMPLANT
POSITIONER FOOT (SOFTGOODS) ×2 IMPLANT
SPONGE GAUZE 4X4 12PLY (GAUZE/BANDAGES/DRESSINGS) ×2 IMPLANT
SUCTION FRAZIER TIP 10 FR DISP (SUCTIONS) IMPLANT
SUT ETHILON 2 0 PSLX (SUTURE) ×2 IMPLANT
SYR CONTROL 10ML LL (SYRINGE) IMPLANT
TOWEL OR 17X24 6PK STRL BLUE (TOWEL DISPOSABLE) ×2 IMPLANT
TOWEL OR 17X26 10 PK STRL BLUE (TOWEL DISPOSABLE) ×2 IMPLANT
TUBE CONNECTING 12X1/4 (SUCTIONS) IMPLANT

## 2012-07-27 NOTE — Preoperative (Signed)
Beta Blockers   Reason not to administer Beta Blockers:Not Applicable 

## 2012-07-27 NOTE — Progress Notes (Signed)
Orthopedic Tech Progress Note Patient Details:  Beth Holden November 20, 1945 [id number] Post op shoe applied Ortho Devices Type of Ortho Device: Postop shoe/boot Ortho Device/Splint Location: Left LE Ortho Device/Splint Interventions: Application   Asia R Thompson 07/27/2012, 3:58 PM

## 2012-07-27 NOTE — Op Note (Signed)
OPERATIVE REPORT  DATE OF SURGERY: 07/27/2012  PATIENT:  Beth Holden,  67 y.o. female  PRE-OPERATIVE DIAGNOSIS:  Abscess Osteomyelitis Right 4th Toe  POST-OPERATIVE DIAGNOSIS:  Abscess osteoarthritis right foot fourth toe  PROCEDURE:  Procedure(s): Right 4th Toe Amputation at Metatarsophalangeal  SURGEON:  Surgeon(s): Nadara Mustard, MD  ANESTHESIA:   general  EBL:  Minimal ML  SPECIMEN:  No Specimen  TOURNIQUET:  * No tourniquets in log *  PROCEDURE DETAILS: Patient is a 67 year old woman with diabetic insensate neuropathy status post second and third toe amputations from [facility] 7 ulceration who presents at this time for amputation of the fourth toe secondary to osteomyelitis and ulceration. Risks and benefits were discussed patient states she understands and wished to proceed at this time. Description of procedure patient was brought to the operating room and underwent a general anesthetic. After adequate levels of anesthesia were obtained patient's right lower extremity was prepped using DuraPrep and draped into a sterile field. A fishmouth incision was made just distal to the MTP joint. The toe was amputated through the MTP joint. Hemostasis was obtained was irrigated with normal saline and the incision was closed using 2-0 nylon. The wound was covered with Adaptic orthopedic sponges AB dressing Kerlix and Coban. Patient was extubated taken to the PACU in stable condition.  PLAN OF CARE: Discharge to home after PACU  PATIENT DISPOSITION:  PACU - hemodynamically stable.   Nadara Mustard, MD 07/27/2012 1:42 PM

## 2012-07-27 NOTE — H&P (Signed)
Beth Holden is an 67 y.o. female.   Chief Complaint: Right fourth toe osteomyelitis HPI: Patient is a 67 year old woman with diabetes peripheral neuropathy ulceration and osteomyelitis of the right foot fourth toe which has failed conservative therapy.  Past Medical History  Diagnosis Date  . Erosive esophagitis   . Peripheral neuropathy   . IBS (irritable bowel syndrome)   . Hyperlipemia     takes Crestor daily  . Acute appendicitis 65/26/2012  . ALOPECIA NEC   . PONV (postoperative nausea and vomiting)   . Seasonal allergies     takes Zyrtec daily  . History of bronchitis     last time a couple of yrs ago  . Headache(784.0)     occasionally;r/t sinus   . Peripheral neuropathy   . Joint pain   . Constipation   . History of colon polyps   . Diabetes mellitus type II     takes Metformin and Flexpen daily  . DIABETES MELLITUS, TYPE II   . Hypertension     takes Enalapril daily  . GERD (gastroesophageal reflux disease)     takes Nexium bid  . Osteomyelitis   . Insomnia     takes Elavil nightly and as well as neuropathy  . Cough     has had a sinus infection-about a couple of weeks ago  . Peripheral neuropathy   . Diverticulosis   . ALLERGIC RHINITIS     takes Zyrtec daily    Past Surgical History  Procedure Laterality Date  . Vaginal hysterectomy  2001  . Oophorectomy  2001  . Tubal ligation    . Rotator cuff repair Right   . Laparoscopic appendectomy  01/05/2011    Procedure: APPENDECTOMY LAPAROSCOPIC;  Surgeon: Valarie Merino, MD;  Location: [facility] ORS;  Service: General;  Laterality: N/A;  . Appendectomy  2012  . Colonoscopy    . Toe amputation  12/28/2011    rt second toe  . Amputation  12/28/2011    Procedure: AMPUTATION DIGIT;  Surgeon: Nadara Mustard, MD;  Location[facility] OR;  Service: Orthopedics;  Laterality: Right;  Right Foot 2nd Toe Amputation at MTP (metatarsophalangeal joint)  . Amputation Right 04/25/2012    Procedure: Right Foot 3rd Toe Amputation;   Surgeon: Nadara Mustard, MD;  Location: [facility] OR;  Service: Orthopedics;  Laterality: Right;  Right Foot Third Toe Amputation     Family History  Problem Relation Age of Onset  . Lung cancer Mother 11  . Cancer Mother     lung  . Hypertension Father   . Hyperlipidemia Father   . Diabetes Father   . Coronary artery disease Father   . Dementia Father   . Stomach cancer Paternal Aunt   . Cancer Paternal Aunt     stomach  . Brain cancer Paternal Uncle   . Cancer Paternal Uncle     brain  . Colon cancer Neg Hx   . Irritable bowel syndrome      Several family members on fathers side   . Diabetes Other   . Cancer Maternal Aunt     stomach   Social History:  reports that she has never smoked. She has never used smokeless tobacco. She reports that she does not drink alcohol or use illicit drugs.  Allergies:  Allergies  Allergen Reactions  . Codeine     Makes her crazy  . Propoxyphene Hcl Itching    Medications Prior to Admission  Medication Sig Dispense Refill  .  acetaminophen (TYLENOL) 500 MG tablet Take 500-1,000 mg by mouth every 6 (six) hours as needed for pain.       Marland Kitchen amitriptyline (ELAVIL) 25 MG tablet Take 25 mg by mouth at bedtime.      . cetirizine (ZYRTEC) 10 MG tablet Take 10 mg by mouth daily.       Marland Kitchen doxycycline (VIBRAMYCIN) 100 MG capsule Take 100 mg by mouth 2 (two) times daily. 15 day course, started 07/16/2012      . enalapril (VASOTEC) 20 MG tablet Take 20 mg by mouth daily.      Marland Kitchen esomeprazole (NEXIUM) 40 MG capsule Take 40 mg by mouth 2 (two) times daily.      Marland Kitchen gabapentin (NEURONTIN) 300 MG capsule Take 300 mg by mouth daily.      Marland Kitchen glucose blood (ONE TOUCH ULTRA TEST) test strip Use as instructed. Testing up to three times a day.  100 each  11  . insulin detemir (LEVEMIR) 100 UNIT/ML injection Inject 47 Units into the skin 2 (two) times daily.       . Insulin Pen Needle 31G X 8 MM MISC 47 Units by Does not apply route 2 (two) times daily.       . metFORMIN  (GLUCOPHAGE) 1000 MG tablet Take 1,000 mg by mouth 2 (two) times daily with a meal.      . Multiple Vitamins-Minerals (CENTRUM SILVER) tablet Take 1 tablet by mouth daily.      . rosuvastatin (CRESTOR) 5 MG tablet Take 5 mg by mouth daily.      . sitaGLIPtin (JANUVIA) 100 MG tablet Take 1 tablet (100 mg total) by mouth daily.  90 tablet  1  . Fluocinolone Acetonide (DERMOTIC) 0.01 % OIL Place 1 drop in ear(s) daily as needed (itching).         Results for orders placed during the hospital encounter of 07/27/12 (from the past 48 hour(s))  APTT     Status: None   Collection Time    07/27/12 10:52 AM      Result Value Range   aPTT 31  24 - 37 seconds  PROTIME-INR     Status: None   Collection Time    07/27/12 10:52 AM      Result Value Range   Prothrombin Time 12.8  11.6 - 15.2 seconds   INR 0.98  0.00 - 1.49  GLUCOSE, CAPILLARY     Status: Abnormal   Collection Time    07/27/12 10:57 AM      Result Value Range   Glucose-Capillary 117 (*) 70 - 99 mg/dL   No results found.  Review of Systems  All other systems reviewed and are negative.    Blood pressure 161/78, pulse 98, temperature 98.1 F (36.7 C), temperature source Oral, resp. rate 20, SpO2 98.00%. Physical Exam  On examination she has palpable pulses she has ulceration swelling redness osteomyelitis of the right foot fourth toe Assessment/Plan Assessment: Right foot fourth toe osteomyelitis abscess ulceration and swelling.  Plan: Will plan for a fourth toe amputation. Risks and benefits were discussed including infection neurovascular injury persistent pain nonhealing of the wound need for higher level surgery. Patient states she understands and wished to proceed at this time.  DUDA,MARCUS V 07/27/2012, 11:44 AM

## 2012-07-27 NOTE — Anesthesia Preprocedure Evaluation (Signed)
Anesthesia Evaluation  Patient identified by MRN, date of birth, ID band Patient awake    Reviewed: Allergy & Precautions, H&P , NPO status , Patient's Chart, lab work & pertinent test results  History of Anesthesia Complications (+) PONV  Airway Mallampati: II  Neck ROM: full    Dental   Pulmonary          Cardiovascular hypertension,     Neuro/Psych  Headaches,  Neuromuscular disease    GI/Hepatic PUD, GERD-  ,  Endo/Other  diabetes, Type 2  Renal/GU      Musculoskeletal   Abdominal   Peds  Hematology   Anesthesia Other Findings   Reproductive/Obstetrics                           Anesthesia Physical Anesthesia Plan  ASA: III  Anesthesia Plan: General   Post-op Pain Management:    Induction: Intravenous  Airway Management Planned: LMA  Additional Equipment:   Intra-op Plan:   Post-operative Plan:   Informed Consent: I have reviewed the patients History and Physical, chart, labs and discussed the procedure including the risks, benefits and alternatives for the proposed anesthesia with the patient or authorized representative who has indicated his/her understanding and acceptance.     Plan Discussed with: CRNA, Anesthesiologist and Surgeon  Anesthesia Plan Comments:         Anesthesia Quick Evaluation

## 2012-07-27 NOTE — Transfer of Care (Signed)
Immediate Anesthesia Transfer of Care Note  [name]  Procedure(s) Performed: Procedure(s) with comments: Right 4th Toe Amputation at Metatarsophalangeal (Right) - Right 4th Toe Amputation at Metatarsophalangeal  Patient Location: PACU  Anesthesia Type:General  Level of Consciousness: awake, alert  and oriented  Airway & Oxygen Therapy: Patient Spontanous Breathing  Post-op Assessment: Report given to PACU RN and Post -op Vital signs reviewed and stable  Post vital signs: Reviewed and stable  Complications: No apparent anesthesia complications

## 2012-07-27 NOTE — Anesthesia Postprocedure Evaluation (Signed)
Anesthesia Post Note  [name]  Procedure(s) Performed: Procedure(s) (LRB): Right 4th Toe Amputation at Metatarsophalangeal (Right)  Anesthesia type: general  Patient location: PACU  Post pain: Pain level controlled  Post assessment: Patient's Cardiovascular Status Stable  Last Vitals:  Filed Vitals:   07/27/12 1545  BP: 153/93  Pulse: 86  Temp:   Resp: 19    Post vital signs: Reviewed and stable  Level of consciousness: sedated  Complications: No apparent anesthesia complications

## 2012-07-30 ENCOUNTER — Encounter (HOSPITAL_COMMUNITY): Payer: Self-pay | Admitting: Orthopedic Surgery

## 2012-08-01 ENCOUNTER — Telehealth: Payer: Self-pay | Admitting: Internal Medicine

## 2012-08-02 ENCOUNTER — Encounter: Payer: Self-pay | Admitting: Internal Medicine

## 2012-08-03 NOTE — Telephone Encounter (Signed)
Patient got in touch with Dr. Elvera Lennox by Earleen Reaper.

## 2012-08-08 ENCOUNTER — Other Ambulatory Visit (INDEPENDENT_AMBULATORY_CARE_PROVIDER_SITE_OTHER): Payer: BC Managed Care – PPO

## 2012-08-08 ENCOUNTER — Ambulatory Visit (INDEPENDENT_AMBULATORY_CARE_PROVIDER_SITE_OTHER)
Admission: RE | Admit: 2012-08-08 | Discharge: 2012-08-08 | Disposition: A | Discharge status: Home / Self Care | Payer: BC Managed Care – PPO | Source: Ambulatory Visit | Attending: Internal Medicine | Admitting: Internal Medicine

## 2012-08-08 ENCOUNTER — Ambulatory Visit (INDEPENDENT_AMBULATORY_CARE_PROVIDER_SITE_OTHER): Payer: BC Managed Care – PPO | Admitting: Internal Medicine

## 2012-08-08 ENCOUNTER — Encounter: Payer: Self-pay | Admitting: Internal Medicine

## 2012-08-08 VITALS — BP 128/88 | HR 95 | Temp 97.6°F | Wt 188.0 lb

## 2012-08-08 DIAGNOSIS — M79609 Pain in unspecified limb: Secondary | ICD-10-CM

## 2012-08-08 DIAGNOSIS — E1149 Type 2 diabetes mellitus with other diabetic neurological complication: Secondary | ICD-10-CM

## 2012-08-08 DIAGNOSIS — E785 Hyperlipidemia, unspecified: Secondary | ICD-10-CM

## 2012-08-08 DIAGNOSIS — I10 Essential (primary) hypertension: Secondary | ICD-10-CM

## 2012-08-08 DIAGNOSIS — M79601 Pain in right arm: Secondary | ICD-10-CM

## 2012-08-08 DIAGNOSIS — D649 Anemia, unspecified: Secondary | ICD-10-CM

## 2012-08-08 LAB — CBC WITH DIFFERENTIAL/PLATELET
Basophils Absolute: 0.1 10*3/uL (ref 0.0–0.1)
Basophils Relative: 0.5 % (ref 0.0–3.0)
Eosinophils Absolute: 0.1 10*3/uL (ref 0.0–0.7)
Eosinophils Relative: 1.1 % (ref 0.0–5.0)
HCT: 39.1 % (ref 36.0–46.0)
Hemoglobin: 12.9 g/dL (ref 12.0–15.0)
Lymphocytes Relative: 24 % (ref 12.0–46.0)
Lymphs Abs: 2.9 10*3/uL (ref 0.7–4.0)
MCHC: 32.9 g/dL (ref 30.0–36.0)
MCV: 80.7 fl (ref 78.0–100.0)
Monocytes Absolute: 0.6 10*3/uL (ref 0.1–1.0)
Monocytes Relative: 5.1 % (ref 3.0–12.0)
Neutro Abs: 8.5 10*3/uL — ABNORMAL HIGH (ref 1.4–7.7)
Neutrophils Relative %: 69.3 % (ref 43.0–77.0)
Platelets: 458 10*3/uL — ABNORMAL HIGH (ref 150.0–400.0)
RBC: 4.84 Mil/uL (ref 3.87–5.11)
RDW: 14.4 % (ref 11.5–14.6)
WBC: 12.2 10*3/uL — ABNORMAL HIGH (ref 4.5–10.5)

## 2012-08-08 LAB — COMPREHENSIVE METABOLIC PANEL
ALT: 19 U/L (ref 0–35)
AST: 22 U/L (ref 0–37)
Albumin: 4 g/dL (ref 3.5–5.2)
Alkaline Phosphatase: 52 U/L (ref 39–117)
BUN: 15 mg/dL (ref 6–23)
CO2: 28 mEq/L (ref 19–32)
Calcium: 10.3 mg/dL (ref 8.4–10.5)
Chloride: 101 mEq/L (ref 96–112)
Creatinine, Ser: 0.6 mg/dL (ref 0.4–1.2)
GFR: 102.73 mL/min (ref >60.00)
Glucose, Bld: 116 mg/dL — ABNORMAL HIGH (ref 70–99)
Potassium: 4.1 mEq/L (ref 3.5–5.1)
Sodium: 139 mEq/L (ref 135–145)
Total Bilirubin: 0.4 mg/dL (ref 0.3–1.2)
Total Protein: 8.2 g/dL (ref 6.0–8.3)

## 2012-08-08 LAB — LIPID PANEL
Cholesterol: 177 mg/dL (ref 0–200)
HDL: 56.1 mg/dL (ref >39.00)
LDL Cholesterol: 103 mg/dL — ABNORMAL HIGH (ref 0–99)
Total CHOL/HDL Ratio: 3
Triglycerides: 88 mg/dL (ref 0.0–149.0)
VLDL: 17.6 mg/dL (ref 0.0–40.0)

## 2012-08-08 LAB — HEPATIC FUNCTION PANEL
ALT: 19 U/L (ref 0–35)
AST: 22 U/L (ref 0–37)
Albumin: 4 g/dL (ref 3.5–5.2)
Alkaline Phosphatase: 52 U/L (ref 39–117)
Bilirubin, Direct: 0 mg/dL (ref 0.0–0.3)
Total Bilirubin: 0.4 mg/dL (ref 0.3–1.2)
Total Protein: 8.2 g/dL (ref 6.0–8.3)

## 2012-08-08 LAB — HEMOGLOBIN A1C: Hgb A1c MFr Bld: 8.3 % — ABNORMAL HIGH (ref 4.6–6.5)

## 2012-08-08 LAB — TSH: TSH: 2.11 u[IU]/mL (ref 0.35–5.50)

## 2012-08-08 NOTE — Patient Instructions (Addendum)
1. Diabetes - insulin pumps have been around a long time and really help brittle diabetics. In Dr. Luan Pulling office both Dr. Everardo All and Beth Holden have a lot of experience and I am confident that Beth Holden will do a great job.  2. Right sided low back pain - you are tender over the right sacroiliac joint Plan Tylenol 1,000 mg three times a day (total daily dose that is safe is 3,000 mg - you must count the tylenol in the hydrocodone/APAP as part of the total)  Aleve (generic) 1 or 2 twice a day (watch for stomach irritation)  Rub of choice and heat.  3. Arm pain - no joint inflammation. No signs of a severely pinched nerve. Suspect cervical spine problems, either arthritic or disk related. Plan c-spine x-rays  Same aleve and tylenol as above along with the gabapentin is the first line of treatment  Sacroiliac Joint Dysfunction The sacroiliac joint connects the lower part of the spine (the sacrum) with the bones of the pelvis. CAUSES  Sometimes, there is no obvious reason for sacroiliac joint dysfunction. Other times, it may occur   During pregnancy.  After injury, such as:  Car accidents.  Sport-related injuries.  Work-related injuries.  Due to one leg being shorter than the other.  Due to other conditions that affect the joints, such as:  Rheumatoid arthritis.  Gout.  Psoriasis.  Joint infection (septic arthritis). SYMPTOMS  Symptoms may include:  Pain in the:  Lower back.  Buttocks.  Groin.  Thighs and legs.  Difficult sitting, standing, walking, lying, bending or lifting. DIAGNOSIS  A number of tests may be used to help diagnose the cause of sacroiliac joint dysfunction, including:  Imaging tests to look for other causes of pain, including:  MRI.  CT scan.  Bone scan.  Diagnostic injection: During a special x-ray (called fluoroscopy), a needle is put into the sacroiliac joint. A numbing medicine is injected into the joint. If the pain is improved or stopped, the  diagnosis of sacroiliac joint dysfunction is more likely. TREATMENT  There are a number of types of treatment used for sacroiliac joint dysfunction, including:  Only take over-the-counter or prescription medicines for pain, discomfort, or fever as directed by your caregiver.  Medications to relax muscles.  Rest. Decreasing activity can help cut down on painful muscle spasms and allow the back to heal.  Application of heat or ice to the lower back may improve muscle spasms and soothe pain.  Brace. A special back brace, called a sacroiliac belt, can help support the joint while your back is healing.  Physical therapy can help teach comfortable positions and exercises to strengthen muscles that support the sacroiliac joint.  Cortisone injections. Injections of steroid medicine into the joint can help decrease swelling and improve pain.  Hyaluronic acid injections. This chemical improves lubrication within the sacroiliac joint, thereby decreasing pain.  Radiofrequency ablation. A special needle is placed into the joint, where it burns away nerves that are carrying pain messages from the joint.  Surgery. Because pain occurs during movement of the joint, screws and plates may be installed in order to limit or prevent joint motion. HOME CARE INSTRUCTIONS   Take all medications exactly as directed.  Follow instructions regarding both rest and physical activity, to avoid worsening the pain.  Do physical therapy exercises exactly as prescribed. SEEK IMMEDIATE MEDICAL CARE IF:  You experience increasingly severe pain.  You develop new symptoms, such as numbness or tingling in your legs or feet.  You lose bladder or bowel control. Document Released: 03/25/2008 Document Revised: 03/21/2011 Document Reviewed: 03/25/2008 [facility] Patient Information 2014 [place], [place].

## 2012-08-08 NOTE — Progress Notes (Signed)
Subjective:    Patient ID: Beth Holden, female    DOB: 07-19-1945, 67 y.o.   MRN: [id number]  HPI Beth Holden has had two more toes amputated right foot since her last visit. She was recently started on doxycyle bid x 30 days to finish treatment of infected toe.   She has been having a sharp pain, constant, at the posterior superior pelvic rim right with some radiation to the anterior.   She has been having proximal RUE pain that is progressing to trapezius and to the left. Unlikely to be crestor and more likely to be Cervical disease.   Past Medical History  Diagnosis Date  . Erosive esophagitis   . Peripheral neuropathy   . IBS (irritable bowel syndrome)   . Hyperlipemia     takes Crestor daily  . Acute appendicitis 01/05/2011  . ALOPECIA NEC   . PONV (postoperative nausea and vomiting)   . Seasonal allergies     takes Zyrtec daily  . History of bronchitis     last time a couple of yrs ago  . Headache(784.0)     occasionally;r/t sinus   . Peripheral neuropathy   . Joint pain   . Constipation   . History of colon polyps   . Diabetes mellitus type II     takes Metformin and Flexpen daily  . DIABETES MELLITUS, TYPE II   . Hypertension     takes Enalapril daily  . GERD (gastroesophageal reflux disease)     takes Nexium bid  . Osteomyelitis   . Insomnia     takes Elavil nightly and as well as neuropathy  . Cough     has had a sinus infection-about a couple of weeks ago  . Peripheral neuropathy   . Diverticulosis   . ALLERGIC RHINITIS     takes Zyrtec daily   Past Surgical History  Procedure Laterality Date  . Vaginal hysterectomy  2001  . Oophorectomy  2001  . Tubal ligation    . Rotator cuff repair Right   . Laparoscopic appendectomy  01/05/2011    Procedure: APPENDECTOMY LAPAROSCOPIC;  Surgeon: Valarie Merino, MD;  Location: [facility] ORS;  Service: General;  Laterality: N/A;  . Appendectomy  2012  . Colonoscopy    . Toe amputation  12/28/2011    rt second toe   . Amputation  12/28/2011    Procedure: AMPUTATION DIGIT;  Surgeon: Nadara Mustard, MD;  Location: [facility] OR;  Service: Orthopedics;  Laterality: Right;  Right Foot 2nd Toe Amputation at MTP (metatarsophalangeal joint)  . Amputation Right 04/25/2012    Procedure: Right Foot 3rd Toe Amputation;  Surgeon: Nadara Mustard, MD;  Location: [facility] OR;  Service: Orthopedics;  Laterality: Right;  Right Foot Third Toe Amputation   . Amputation Right 07/27/2012    Procedure: Right 4th Toe Amputation at Metatarsophalangeal;  Surgeon: Nadara Mustard, MD;  Location: [facility] OR;  Service: Orthopedics;  Laterality: Right;  Right 4th Toe Amputation at Metatarsophalangeal   Family History  Problem Relation Age of Onset  . Lung cancer Mother 36  . Cancer Mother     lung  . Hypertension Father   . Hyperlipidemia Father   . Diabetes Father   . Coronary artery disease Father   . Dementia Father   . Stomach cancer Paternal Aunt   . Cancer Paternal Aunt     stomach  . Brain cancer Paternal Uncle   . Cancer Paternal Uncle     brain  .  Colon cancer Neg Hx   . Irritable bowel syndrome      Several family members on fathers side   . Diabetes Other   . Cancer Maternal Aunt     stomach   History   Social History  . Marital Status: Married    Spouse Name: N/A    Number of Children: 2  . Years of Education: N/A   Occupational History  . Retired     Social History Main Topics  . Smoking status: Never Smoker   . Smokeless tobacco: Never Used  . Alcohol Use: No  . Drug Use: No  . Sexually Active: Yes    Birth Control/ Protection: Surgical   Other Topics Concern  . Not on file   Social History Narrative   Caffeine daily    [employer], [employer] no diploma   Married '66   1 dtr- '78; 1 son '71; 2 grandchildren   Occupation: retired 04   Dad with alzheimers-had to place in [place] (summer '10)          Current Outpatient Prescriptions on File Prior to Visit  Medication Sig Dispense Refill  . acetaminophen (TYLENOL) 500 MG  tablet Take 500-1,000 mg by mouth every 6 (six) hours as needed for pain.       Marland Kitchen amitriptyline (ELAVIL) 25 MG tablet Take 25 mg by mouth at bedtime.      . cetirizine (ZYRTEC) 10 MG tablet Take 10 mg by mouth daily.       Marland Kitchen doxycycline (VIBRAMYCIN) 100 MG capsule Take 100 mg by mouth 2 (two) times daily. 15 day course, started 07/16/2012      . enalapril (VASOTEC) 20 MG tablet Take 20 mg by mouth daily.      Marland Kitchen esomeprazole (NEXIUM) 40 MG capsule Take 40 mg by mouth 2 (two) times daily.      . Fluocinolone Acetonide (DERMOTIC) 0.01 % OIL Place 1 drop in ear(s) daily as needed (itching).       . gabapentin (NEURONTIN) 300 MG capsule Take 300 mg by mouth daily.      Marland Kitchen glucose blood (ONE TOUCH ULTRA TEST) test strip Use as instructed. Testing up to three times a day.  100 each  11  . HYDROcodone-acetaminophen (NORCO) 5-325 MG per tablet Take 1 tablet by mouth every 6 (six) hours as needed for pain.  30 tablet  0  . insulin detemir (LEVEMIR) 100 UNIT/ML injection Inject 47 Units into the skin 2 (two) times daily.       . Insulin Pen Needle 31G X 8 MM MISC 47 Units by Does not apply route 2 (two) times daily.       . metFORMIN (GLUCOPHAGE) 1000 MG tablet Take 1,000 mg by mouth 2 (two) times daily with a meal.      . Multiple Vitamins-Minerals (CENTRUM SILVER) tablet Take 1 tablet by mouth daily.      . rosuvastatin (CRESTOR) 5 MG tablet Take 5 mg by mouth daily.      . sitaGLIPtin (JANUVIA) 100 MG tablet Take 1 tablet (100 mg total) by mouth daily.  90 tablet  1   No current facility-administered medications on file prior to visit.     Review of Systems System review is negative for any constitutional, cardiac, pulmonary, GI or neuro symptoms or complaints other than as described in the HPI.     Objective:   Physical Exam Filed Vitals:   08/08/12 1124  BP: 128/88  Pulse: 95  Temp:  97.6 F (36.4 C)   Wt Readings from Last 3 Encounters:  08/08/12 188 lb (85.276 kg)  07/18/12 187 lb 12.8 oz  (85.186 kg)  05/18/12 186 lb 12.8 oz (84.732 kg)   Gen'l- overweight white woman in no distress HEENT- C&S clear Cor - RRR Pulm  - normal respirations MSK - right foot in ace wrap and dressing. Tender at the right SI joint. Nl ROM shoulders Neuro - RUE, normal grip strength, nl proximal strength, nl sensation to light touch, nl DTR  Recent Results (from the past 2160 hour(s))  BASIC METABOLIC PANEL     Status: Abnormal   Collection Time    07/18/12  1:41 PM      Result Value Range   Sodium 138  135 - 145 mEq/L   Potassium 3.6  3.5 - 5.1 mEq/L   Chloride 101  96 - 112 mEq/L   CO2 27  19 - 32 mEq/L   Glucose, Bld 247 (*) 70 - 99 mg/dL   BUN 16  6 - 23 mg/dL   Creatinine, Ser 4.09  0.50 - 1.10 mg/dL   Calcium 81.1  8.4 - 91.4 mg/dL   GFR calc non Af Amer >90  >90 mL/min   GFR calc Af Amer >90  >90 mL/min   Comment:            The eGFR has been calculated     using the CKD EPI equation.     This calculation has not been     validated in all clinical     situations.     eGFR's persistently     <90 mL/min signify     possible Chronic Kidney Disease.  CBC     Status: Abnormal   Collection Time    07/18/12  1:41 PM      Result Value Range   WBC 9.5  4.0 - 10.5 K/uL   RBC 4.18  3.87 - 5.11 MIL/uL   Hemoglobin 11.0 (*) 12.0 - 15.0 g/dL   HCT 78.2 (*) 95.6 - 21.3 %   MCV 79.2  78.0 - 100.0 fL   MCH 26.3  26.0 - 34.0 pg   MCHC 33.2  30.0 - 36.0 g/dL   RDW 08.6  57.8 - 46.9 %   Platelets 418 (*) 150 - 400 K/uL  APTT     Status: None   Collection Time    07/27/12 10:52 AM      Result Value Range   aPTT 31  24 - 37 seconds  PROTIME-INR     Status: None   Collection Time    07/27/12 10:52 AM      Result Value Range   Prothrombin Time 12.8  11.6 - 15.2 seconds   INR 0.98  0.00 - 1.49  GLUCOSE, CAPILLARY     Status: Abnormal   Collection Time    07/27/12 10:57 AM      Result Value Range   Glucose-Capillary 117 (*) 70 - 99 mg/dL  GLUCOSE, CAPILLARY     Status: None    Collection Time    07/27/12 12:50 PM      Result Value Range   Glucose-Capillary 88  70 - 99 mg/dL  GLUCOSE, CAPILLARY     Status: None   Collection Time    07/27/12  2:27 PM      Result Value Range   Glucose-Capillary 84  70 - 99 mg/dL   Comment 1 Notify RN    HEMOGLOBIN A1C  Status: Abnormal   Collection Time    08/08/12 12:29 PM      Result Value Range   Hemoglobin A1C 8.3 (*) 4.6 - 6.5 %   Comment: Glycemic Control Guidelines for People with Diabetes:Non Diabetic:  <6%Goal of Therapy: <7%Additional Action Suggested:  >8%   HEPATIC FUNCTION PANEL     Status: None   Collection Time    08/08/12 12:29 PM      Result Value Range   Total Bilirubin 0.4  0.3 - 1.2 mg/dL   Bilirubin, Direct 0.0  0.0 - 0.3 mg/dL   Alkaline Phosphatase 52  39 - 117 U/L   AST 22  0 - 37 U/L   ALT 19  0 - 35 U/L   Total Protein 8.2  6.0 - 8.3 g/dL   Albumin 4.0  3.5 - 5.2 g/dL  TSH     Status: None   Collection Time    08/08/12 12:29 PM      Result Value Range   TSH 2.11  0.35 - 5.50 uIU/mL  COMPREHENSIVE METABOLIC PANEL     Status: Abnormal   Collection Time    08/08/12 12:29 PM      Result Value Range   Sodium 139  135 - 145 mEq/L   Potassium 4.1  3.5 - 5.1 mEq/L   Chloride 101  96 - 112 mEq/L   CO2 28  19 - 32 mEq/L   Glucose, Bld 116 (*) 70 - 99 mg/dL   BUN 15  6 - 23 mg/dL   Creatinine, Ser 0.6  0.4 - 1.2 mg/dL   Total Bilirubin 0.4  0.3 - 1.2 mg/dL   Alkaline Phosphatase 52  39 - 117 U/L   AST 22  0 - 37 U/L   ALT 19  0 - 35 U/L   Total Protein 8.2  6.0 - 8.3 g/dL   Albumin 4.0  3.5 - 5.2 g/dL   Calcium 29.5  8.4 - 62.1 mg/dL   GFR 308.65  >78.46 mL/min  LIPID PANEL     Status: Abnormal   Collection Time    08/08/12 12:29 PM      Result Value Range   Cholesterol 177  0 - 200 mg/dL   Comment: ATP III Classification       Desirable:  < 200 mg/dL               Borderline High:  200 - 239 mg/dL          High:  > = 962 mg/dL   Triglycerides 95.2  0.0 - 149.0 mg/dL   Comment:  Normal:  <841 mg/dLBorderline High:  150 - 199 mg/dL   HDL 32.44  >01.02 mg/dL   VLDL 72.5  0.0 - 36.6 mg/dL   LDL Cholesterol 440 (*) 0 - 99 mg/dL   Total CHOL/HDL Ratio 3     Comment:                Men          Women1/2 Average Risk     3.4          3.3Average Risk          5.0          4.42X Average Risk          9.6          7.13X Average Risk          15.0  11.0                      CBC WITH DIFFERENTIAL     Status: Abnormal   Collection Time    08/08/12 12:29 PM      Result Value Range   WBC 12.2 (*) 4.5 - 10.5 K/uL   RBC 4.84  3.87 - 5.11 Mil/uL   Hemoglobin 12.9  12.0 - 15.0 g/dL   HCT 16.1  09.6 - 04.5 %   MCV 80.7  78.0 - 100.0 fl   MCHC 32.9  30.0 - 36.0 g/dL   RDW 40.9  81.1 - 91.4 %   Platelets 458.0 (*) 150.0 - 400.0 K/uL   Neutrophils Relative % 69.3  43.0 - 77.0 %   Lymphocytes Relative 24.0  12.0 - 46.0 %   Monocytes Relative 5.1  3.0 - 12.0 %   Eosinophils Relative 1.1  0.0 - 5.0 %   Basophils Relative 0.5  0.0 - 3.0 %   Neutro Abs 8.5 (*) 1.4 - 7.7 K/uL   Lymphs Abs 2.9  0.7 - 4.0 K/uL   Monocytes Absolute 0.6  0.1 - 1.0 K/uL   Eosinophils Absolute 0.1  0.0 - 0.7 K/uL   Basophils Absolute 0.1  0.0 - 0.1 K/uL        Assessment & Plan:  Sacroiliac inflammation - location, nature and degree of pain is consistent with sacroiliac inflammation.  Plan Rub of choice  Heat  NSAIDs, GI precautions.

## 2012-08-09 NOTE — Assessment & Plan Note (Signed)
Taking all her medications and trying to watch diet. Limited in physical activity due to toe amputations recently. A1C is above goal @ 8.1 % but improved.  Plan` continue present regimen   Continue with Dr. Lafe Garin - may benefit from insulin pump.

## 2012-08-09 NOTE — Assessment & Plan Note (Signed)
BP Readings from Last 3 Encounters:  08/08/12 128/88  07/27/12 159/80  07/27/12 159/80   OK control. Bmet is normal.

## 2012-08-09 NOTE — Assessment & Plan Note (Signed)
Taking and tolerating medication. Lab reveals LDL very close to goal of 161 or less; HDL is better than goal of 40+; liver functions are normal.  Plan Continue present medications

## 2012-08-09 NOTE — Assessment & Plan Note (Signed)
Hemoglobin is in normal range as of today's labs

## 2012-08-17 ENCOUNTER — Encounter: Payer: Self-pay | Admitting: Internal Medicine

## 2012-08-17 ENCOUNTER — Ambulatory Visit (INDEPENDENT_AMBULATORY_CARE_PROVIDER_SITE_OTHER): Payer: BC Managed Care – PPO | Admitting: Internal Medicine

## 2012-08-17 VITALS — BP 122/80 | HR 94 | Temp 98.4°F | Resp 12 | Wt 192.0 lb

## 2012-08-17 DIAGNOSIS — E1149 Type 2 diabetes mellitus with other diabetic neurological complication: Secondary | ICD-10-CM

## 2012-08-17 MED ORDER — CANAGLIFLOZIN 100 MG PO TABS: 100.0000 mg | ORAL_TABLET | Freq: Every day | ORAL | Status: DC

## 2012-08-17 NOTE — Patient Instructions (Addendum)
Please start Invokana 100 mg in am right before breakfast. Decrease Levemir to 37 units at night. Keep the dose in am at 47 units. Continue Metformin 1000 mg 2x a day. Continue Januvia 100 mg daily. Please return in 1 month with your sugar log.

## 2012-08-17 NOTE — Progress Notes (Signed)
Subjective:     Patient ID: Beth Holden, female   DOB: 1945/04/04, 67 y.o.   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 Last visit 3 mo ago.  She had her 4th Right toe amputated last month (osteomyelitis). Had the R 2nd right toe amputated on 12/2011, the R 3rd toe amputated on 04/25/2012.  Last HbA1C was: Lab Results  Component Value Date   HGBA1C 8.3* 08/08/2012  Decreased from 10.1% on 03/05/2012, previously 12.6%.   She is on: - Metformin 1000 mg bid - Levemir 47 units bid - Januvia 100 mg daily At last visits, we discussed about starting her on a VGo mechanical pump. She had an appointment with diabetes education but decided not to pursue it for now. Last UTI was 6 mo ago, from ABx. Tx with Doxyxycline.   She checks her sugars 1-3x a day - does not bring a log: - am: 95 - 200 >> 86-139 >> 70s-100 (depends on dinner), occas. 170 - later 138-160 >> after b'fast: 141 - 2-3h after lunch: 170-200. - after dinner: <200 - bedtime: 200s Lowest: 71 - in am. Has hypoglycemia in the 70's.   Tries to limit the snacks at night. a snack. She usually gets hungry around bedtime. Dinner is at 6-7 pm. She is not walking anymore b/c amputated toes. Keeps her granddaughter 4x a week.  - last eye exam in 11/2011: no DR - No CKD. She is on Enalapril. BUN  Date Value Range Status  08/08/2012 15  6 - 23 mg/dL Final     Creatinine, Ser  Date Value Range Status  08/08/2012 0.6  0.4 - 1.2 mg/dL Final  - Has numbness and tingling in feet - on Gabapentin and Amitriptyline, but feels that these are not helping. We discussed about her tapering him off and trying alpha lipoic acid and B Vitamin complex. She has neuropathic pain (lightning stabs of pain).   Review of Systems Constitutional: no weight  loss/gain, no fatigue, no subjective hyperthermia/hypothermia Eyes: no blurry vision, no xerophthalmia ENT: no sore throat, no nodules palpated in throat, no dysphagia/odynophagia, no hoarseness Cardiovascular: no CP/SOB/palpitations/leg swelling Respiratory: no cough/SOB Gastrointestinal: no N/V/D/C Musculoskeletal: no muscle/joint aches Skin: no rashes Neurological: no tremors/numbness/tingling/dizziness  I reviewed pt's medications, allergies, PMH, social hx, family hx and no changes required, except as above.  Objective:   Physical Exam BP 122/80  Pulse 94  Temp(Src) 98.4 F (36.9 C) (Oral)  Resp 12  Wt 192 lb (87.091 kg)  BMI 31.45 kg/m2  SpO2 95% Wt Readings from Last 3 Encounters:  08/17/12 192 lb (87.091 kg)  08/08/12 188 lb (85.276 kg)  07/18/12 187 lb 12.8 oz (85.186 kg)   Constitutional: overweight, in NAD Eyes: PERRLA, EOMI, no exophthalmos ENT: moist mucous membranes, no thyromegaly, no cervical lymphadenopathy Cardiovascular: RRR, No MRG Respiratory: CTA B Gastrointestinal: abdomen soft, NT, ND, BS+ Musculoskeletal: strength intact in all 4; missing right toes: 2,3,4 Skin: moist, warm, no rashes  Assessment:     1. DM2, uncontrolled, insulin-dependent, with complications - diabetic peripheral neuropathy - 3 toe amputations - after infected diabetic toe ulcers >> OM:  R 2nd toe amputated on 12/28/2011  R 3rd toe amputated on 04/25/2012  R 4th toe amputated on 07/27/2012  2. Diabetic peripheral neuropathy    Plan:     1.  DM2 Patient with long-standing diabetes, poorly controlled, with history of 3 toes amputated in the last year. Patient is on a high dose of basal insulin, but no mealtime insulin. Her sugars are improved in last months, but she has some low sugars in a.m., and some higher sugars after meals, up to 200s.  - I think she would be a great candidate for a VGo system, but she is not keen to this idea as of now. I hope she will consider this  in the future - for now, we'll continue the same regimen, but add Invokana 100 mg daily. We discussed about the benefits of Invokana, acting on both fasting and postprandial sugars, and causing some weight loss. We also discussed about side effects, to include lower blood pressure, dizziness/orthostasis (advised her to stay hydrated), and urinary tract infections, mostly fungal. She has had a history of urinary tract infections, but only after antibiotic treatments. I advised her to let me know if she has another one, we might need to stop the medication if she develops recurrent UTIs. In that case, we might also try glipizide in a.m. - return to clinic in 1 months with her sugar log  2. Peripheral neuropathy -  Patient tried to come off gabapentin for 5 days, before last visit, however the pain became severe and she had to restart. She is now on gabapentin and amitriptyline. - She did not try the alpha lipoic acid and B vitamin complex yet.

## 2012-09-12 ENCOUNTER — Telehealth: Payer: Self-pay | Admitting: Internal Medicine

## 2012-09-12 ENCOUNTER — Telehealth: Payer: Self-pay | Admitting: *Deleted

## 2012-09-12 NOTE — Telephone Encounter (Signed)
Called pt and advised her ok to come by office after 2 pm to pick up samples of Invokana 100mg . Pt understood.

## 2012-09-20 DIAGNOSIS — M67919 Unspecified disorder of synovium and tendon, unspecified shoulder: Secondary | ICD-10-CM | POA: Diagnosis not present

## 2012-09-21 ENCOUNTER — Ambulatory Visit (INDEPENDENT_AMBULATORY_CARE_PROVIDER_SITE_OTHER): Payer: Medicare Other | Admitting: Internal Medicine

## 2012-09-21 ENCOUNTER — Encounter: Payer: Self-pay | Admitting: Internal Medicine

## 2012-09-21 VITALS — BP 110/68 | HR 100 | Temp 97.9°F | Resp 12 | Wt 185.0 lb

## 2012-09-21 DIAGNOSIS — E1149 Type 2 diabetes mellitus with other diabetic neurological complication: Secondary | ICD-10-CM | POA: Diagnosis not present

## 2012-09-21 MED ORDER — METFORMIN HCL 1000 MG PO TABS: 1000.0000 mg | ORAL_TABLET | Freq: Two times a day (BID) | ORAL | Status: DC

## 2012-09-21 MED ORDER — INSULIN DETEMIR 100 UNIT/ML ~~LOC~~ SOLN: 77.0000 [IU] | Freq: Every day | SUBCUTANEOUS | Status: DC

## 2012-09-21 MED ORDER — INSULIN PEN NEEDLE 31G X 5 MM MISC: Status: DC

## 2012-09-21 MED ORDER — CANAGLIFLOZIN 100 MG PO TABS: 100.0000 mg | ORAL_TABLET | Freq: Every day | ORAL | Status: DC

## 2012-09-21 NOTE — Patient Instructions (Addendum)
Please continue the current diabetic regimen. Please return in 3 month with your sugar log.

## 2012-09-21 NOTE — Progress Notes (Signed)
Subjective:     Patient ID: Beth Holden, female   DOB: 01/02/1946, 67 y.o.   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 Last visit 1 mo ago.  She got a R shoulder steroid inj. yesterday.  Last HbA1C was: Lab Results  Component Value Date   HGBA1C 8.3* 08/08/2012  Decreased from 10.1% on 03/05/2012, previously 12.6%.   She is on: - Metformin 1000 mg bid - Levemir 47 units in am and 37 units in pm  - Januvia 100 mg daily - Invokana 100 mg added at last visit. Last UTI was 01/2012, from ABx. Tx with Doxyxycline.  We discussed about starting her on a VGo mechanical pump in the past. She had an appointment with diabetes education but decided not to pursue it for now.  She checks her sugars 1-3x a day - brings a log, which we reviewed together: - am: 95 - 200 >> 86-139 >> 70s-100 (depends on dinner), occas. 170 >> 83-155 - 2h after b'fast 138-160 >> after b'fast: 141 >> 145-177 - prelunch: 80-124 - 2-3h after lunch: 170-200 >> 125-215 - before dinner: 75-166 - after dinner: <200 >> 196 - bedtime: 200s >> 159-232 Lowest: 75 - once Has hypoglycemia in the 70's.   Tries to limit the snacks at night. She usually gets hungry around bedtime. Dinner is later, up to 9 pm. She is not walking anymore b/c amputated toes. She has a 65 y/o granddaughter and a 65 week old granddaughter - she is now living with her daughter to help her.  - last eye exam in 11/2011: no DR - No CKD. She is on Enalapril. BUN  Date Value Range Status  08/08/2012 15  6 - 23 mg/dL Final     Creatinine, Ser  Date Value Range Status  08/08/2012 0.6  0.4 - 1.2 mg/dL Final  - Has numbness and tingling in feet - on Gabapentin and Amitriptyline, but feels that these are not helping. We discussed about her tapering them off and trying  alpha lipoic acid and B Vitamin complex, but she had increased flares with tapering, so she is on them.   Review of Systems Constitutional: + weight loss, no fatigue, + hot flushes Eyes: no blurry vision, no xerophthalmia ENT: no sore throat, no nodules palpated in throat, no dysphagia/odynophagia, no hoarseness Cardiovascular: no CP/SOB/palpitations/leg swelling Respiratory: no cough/SOB Gastrointestinal: no N/V/D/+ C Musculoskeletal: no muscle/joint aches Skin: no rashes Low libido   I reviewed pt's medications, allergies, PMH, social hx, family hx and no changes required, except as above.  Objective:   Physical Exam BP 110/68  Pulse 100  Temp(Src) 97.9 F (36.6 C) (Oral)  Resp 12  Wt 185 lb (83.915 kg)  BMI 30.31 kg/m2  SpO2 97% Wt Readings from Last 3 Encounters:  09/21/12 185 lb (83.915 kg)  08/17/12 192 lb (87.091 kg)  08/08/12 188 lb (85.276 kg)   Constitutional: overweight, in NAD Eyes: PERRLA, EOMI, no exophthalmos ENT: moist mucous membranes, no thyromegaly, no cervical lymphadenopathy Cardiovascular: RRR, No MRG Respiratory: CTA B Gastrointestinal: abdomen soft, NT, ND, BS+ Musculoskeletal: strength intact in all 4; missing right toes: 2,3,4 Skin: moist, warm, no rashes  Assessment:     1. DM2, uncontrolled, insulin-dependent, with complications - diabetic peripheral neuropathy - 3 toe amputations - after infected diabetic toe ulcers >> OM:  R 2nd toe amputated on 12/28/2011  R 3rd toe amputated on 04/25/2012  R 4th toe amputated on 07/27/2012  2. Diabetic peripheral neuropathy    Plan:     1. DM2 Patient with long-standing diabetes, with improving control after adding Invokana and losing weight - 7 lbs! Patient is still on a high dose of basal insulin, but no mealtime insulin. - since her sugars are better, and if she continues to lose weight, we will continue the same regimen. She tolerates Invokana well. We have room for increasing the dose to  300 mg if we need to. She has had a history of urinary tract infections, but only after antibiotic treatments.  - she knowsto let me know if she has another UTI, we might need to stop the medication if she develops recurrent UTIs. In that case, we might also try glipizide in a.m. - return to clinic in 3 months with her sugar log  2. Peripheral neuropathy - She did not try the alpha lipoic acid and B vitamin complex yet.

## 2012-09-24 ENCOUNTER — Encounter: Payer: BC Managed Care – PPO | Admitting: Internal Medicine

## 2012-10-10 ENCOUNTER — Encounter: Payer: Self-pay | Admitting: Gastroenterology

## 2012-10-22 ENCOUNTER — Encounter: Payer: Self-pay | Admitting: Gastroenterology

## 2012-10-22 DIAGNOSIS — M67919 Unspecified disorder of synovium and tendon, unspecified shoulder: Secondary | ICD-10-CM | POA: Diagnosis not present

## 2012-10-30 DIAGNOSIS — G8918 Other acute postprocedural pain: Secondary | ICD-10-CM | POA: Diagnosis not present

## 2012-10-30 DIAGNOSIS — M25819 Other specified joint disorders, unspecified shoulder: Secondary | ICD-10-CM | POA: Diagnosis not present

## 2012-10-30 DIAGNOSIS — M75120 Complete rotator cuff tear or rupture of unspecified shoulder, not specified as traumatic: Secondary | ICD-10-CM | POA: Diagnosis not present

## 2012-10-30 DIAGNOSIS — M19019 Primary osteoarthritis, unspecified shoulder: Secondary | ICD-10-CM | POA: Diagnosis not present

## 2012-10-30 DIAGNOSIS — M24119 Other articular cartilage disorders, unspecified shoulder: Secondary | ICD-10-CM | POA: Diagnosis not present

## 2012-10-30 DIAGNOSIS — M67919 Unspecified disorder of synovium and tendon, unspecified shoulder: Secondary | ICD-10-CM | POA: Diagnosis not present

## 2012-11-13 ENCOUNTER — Other Ambulatory Visit: Payer: Self-pay | Admitting: *Deleted

## 2012-11-13 MED ORDER — INSULIN DETEMIR 100 UNIT/ML ~~LOC~~ SOLN: SUBCUTANEOUS | Status: DC

## 2012-11-13 MED ORDER — SITAGLIPTIN PHOSPHATE 100 MG PO TABS: 100.0000 mg | ORAL_TABLET | Freq: Every day | ORAL | Status: DC

## 2012-11-15 ENCOUNTER — Other Ambulatory Visit: Payer: Self-pay

## 2012-11-22 NOTE — Telephone Encounter (Signed)
Phone note completed ° °

## 2012-11-30 DIAGNOSIS — L259 Unspecified contact dermatitis, unspecified cause: Secondary | ICD-10-CM | POA: Diagnosis not present

## 2012-11-30 DIAGNOSIS — L538 Other specified erythematous conditions: Secondary | ICD-10-CM | POA: Diagnosis not present

## 2012-11-30 DIAGNOSIS — L408 Other psoriasis: Secondary | ICD-10-CM | POA: Diagnosis not present

## 2012-12-10 ENCOUNTER — Encounter: Payer: Self-pay | Admitting: Gastroenterology

## 2012-12-10 ENCOUNTER — Ambulatory Visit (INDEPENDENT_AMBULATORY_CARE_PROVIDER_SITE_OTHER): Payer: Medicare Other | Admitting: Gastroenterology

## 2012-12-10 VITALS — BP 110/68 | HR 88 | Ht 65.5 in | Wt 183.0 lb

## 2012-12-10 DIAGNOSIS — Z86010 Personal history of colonic polyps: Secondary | ICD-10-CM | POA: Diagnosis not present

## 2012-12-10 DIAGNOSIS — R109 Unspecified abdominal pain: Secondary | ICD-10-CM

## 2012-12-10 DIAGNOSIS — K219 Gastro-esophageal reflux disease without esophagitis: Secondary | ICD-10-CM | POA: Diagnosis not present

## 2012-12-10 DIAGNOSIS — K5900 Constipation, unspecified: Secondary | ICD-10-CM

## 2012-12-10 MED ORDER — PEG-KCL-NACL-NASULF-NA ASC-C 100 G PO SOLR: 1.0000 | Freq: Once | ORAL | Status: DC

## 2012-12-10 MED ORDER — ESOMEPRAZOLE MAGNESIUM 40 MG PO CPDR: 40.0000 mg | DELAYED_RELEASE_CAPSULE | Freq: Two times a day (BID) | ORAL | Status: DC

## 2012-12-10 NOTE — Progress Notes (Signed)
    History of Present Illness: This is a 67 year old female with a personal history of adenomatous colon polyps. She has had chronic constipation and generally has a bowel movement about every 4 days. She notes lower abdominal pain, occasionally right upper quadrant pain, gas and bloating associated with her constipation. Her reflux symptoms are well-controlled on Nexium 40 mg twice a day. She underwent upper endoscopy in 2009 which was normal. She is due for a five-year surveillance colonoscopy.  Current Medications, Allergies, Past Medical History, Past Surgical History, Family History and Social History were reviewed in [employer] record.  Physical Exam: General: Well developed , well nourished, no acute distress Head: Normocephalic and atraumatic Eyes:  sclerae anicteric, EOMI Ears: Normal auditory acuity Mouth: No deformity or lesions Lungs: Clear throughout to auscultation Heart: Regular rate and rhythm; no murmurs, rubs or bruits Abdomen: Soft, non tender and non distended. No masses, hepatosplenomegaly or hernias noted. Normal Bowel sounds Rectal: Musculoskeletal: Symmetrical with no gross deformities  Pulses:  Normal pulses noted Extremities: No clubbing, cyanosis, edema or deformities noted Neurological: Alert oriented x 4, grossly nonfocal Psychological:  Alert and cooperative. Normal mood and affect  Assessment and Recommendations:  1. Constipation, gas, bloating, abdominal pain. Begin MiraLax once or twice daily titrated for adequate bowel movements. Begin a low gas diet Gas-X 4 times a day when necessary.  2. Personal history of adenomatous colon polyps. She is due for a five-year surveillance colonoscopy. Schedule colonoscopy. The risks, benefits, and alternatives to colonoscopy with possible biopsy and possible polypectomy were discussed with the patient and they consent to proceed.   3. GERD. Continue Nexium 40 mg twice a day and standard  antireflux measures. Endoscopy in 2009 normal.

## 2012-12-10 NOTE — Patient Instructions (Signed)
Start taking over the counter Miralax mixing 17 grams in 8 oz of water 1-2 x daily to titrate depending on bowel movements. Please take this medication every day, especially 5-7 days leading up to procedure.   You have been scheduled for a colonoscopy with propofol. Please follow written instructions given to you at your visit today.  Please pick up your prep kit at the pharmacy within the next 1-3 days. If you use inhalers (even only as needed), please bring them with you on the day of your procedure. Your physician has requested that you go to www.startemmi.com and enter the access code given to you at your visit today. This web site gives a general overview about your procedure. However, you should still follow specific instructions given to you by our office regarding your preparation for the procedure.  We have sent the following medications to your pharmacy for you to pick up at your convenience: Nexium.  Thank you for choosing me and [facility].  Venita Lick. Pleas Koch., MD., [facility]

## 2012-12-28 ENCOUNTER — Ambulatory Visit (INDEPENDENT_AMBULATORY_CARE_PROVIDER_SITE_OTHER): Payer: Medicare Other | Admitting: Internal Medicine

## 2012-12-28 ENCOUNTER — Encounter: Payer: Self-pay | Admitting: Internal Medicine

## 2012-12-28 VITALS — BP 108/68 | HR 101 | Temp 98.3°F | Resp 12 | Wt 183.0 lb

## 2012-12-28 DIAGNOSIS — E1149 Type 2 diabetes mellitus with other diabetic neurological complication: Secondary | ICD-10-CM

## 2012-12-28 LAB — HEMOGLOBIN A1C: Hgb A1c MFr Bld: 7.2 % — ABNORMAL HIGH (ref 4.6–6.5)

## 2012-12-28 MED ORDER — INSULIN PEN NEEDLE 31G X 5 MM MISC: Status: DC

## 2012-12-28 MED ORDER — INSULIN DETEMIR 100 UNIT/ML ~~LOC~~ SOLN: SUBCUTANEOUS | Status: DC

## 2012-12-28 NOTE — Progress Notes (Signed)
Subjective:     Patient ID: Beth Holden, female   DOB: 1945-07-11, 67 y.o.   MRN: [id number]  HPI Beth Holden is a pleasant 67 y.o. 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 Last visit 3 mo ago.  Since I last saw her, she had R shoulder surgery in 10/2012. Has colonoscopy in 3 days.  Last HbA1C was: Lab Results  Component Value Date   HGBA1C 8.3* 08/08/2012   HGBA1C 10.1* 03/05/2012   HGBA1C 12.6* 11/17/2011   She is on: - Metformin 1000 mg bid - Levemir 47 units in am and 37 units in pm  - Januvia 100 mg daily - Invokana 100 mg. Last UTI was 01/2012, from ABx. Tx with Doxycycline.  We discussed about starting her on a VGo mechanical pump in the past. She had an appointment with diabetes education but decided not to pursue it.  She checks her sugars 1-3x a day - brings a log, which we reviewed together: - am: 95 - 200 >> 86-139 >> 70s-100 (depends on dinner), occas. 170 >> 83-155 >> 71-197 - 2h after b'fast 138-160 >> after b'fast: 141 >> 145-177 >> 122-185 - prelunch: 80-124 >> 120-184 - 2-3h after lunch: 170-200 >> 125-215 >>70--174 - before dinner: 75-166 >> 94-151 - after dinner: <200 >> 196 >> 208-318 - bedtime: 200s >> 159-232 >> 101-204 Lowest: 65 x 1 - once Has hypoglycemia in the 70's.   Tries to limit the snacks at night. She usually gets hungry around bedtime. Dinner is later, up to 9 pm. She is not walking anymore b/c amputated toes.   - last eye exam in 11/2011: no DR - No CKD. She is on Enalapril. BUN  Date Value Range Status  08/08/2012 15  6 - 23 mg/dL Final     Creatinine, Ser  Date Value Range Status  08/08/2012 0.6  0.4 - 1.2 mg/dL Final  - Has numbness and tingling in feet - on Gabapentin and Amitriptyline, but feels that these are not helping. We discussed about her tapering them off and trying alpha lipoic acid  and B Vitamin complex, but she had increased flares with tapering, so she is taking them again.   Review of Systems Constitutional: no weight gain/loss, no fatigue, + poor sleep Eyes: no blurry vision, no xerophthalmia ENT: no sore throat, no nodules palpated in throat, no dysphagia/odynophagia, no hoarseness Cardiovascular: no CP/SOB/palpitations/leg swelling Respiratory: + cough/no SOB Gastrointestinal: no N/V/D/+ C Musculoskeletal: no muscle/joint aches Skin: no rashes  I reviewed pt's medications, allergies, PMH, social hx, family hx and no changes required, except as above.  Objective:   Physical Exam BP 108/68  Pulse 101  Temp(Src) 98.3 F (36.8 C) (Oral)  Resp 12  Wt 183 lb (83.008 kg)  SpO2 95% Wt Readings from Last 3 Encounters:  12/28/12 183 lb (83.008 kg)  12/10/12 183 lb (83.008 kg)  09/21/12 185 lb (83.915 kg)   Constitutional: overweight, in NAD Eyes: PERRLA, EOMI, no exophthalmos ENT: moist mucous membranes, no thyromegaly, no cervical lymphadenopathy Cardiovascular: RRR, No MRG Respiratory: CTA B Gastrointestinal: abdomen soft, NT, ND, BS+ Musculoskeletal: strength intact in all 4; missing right toes: 2,3,4 Skin: moist, warm, no rashes  Assessment:     1. DM2, uncontrolled, insulin-dependent, with complications - diabetic peripheral neuropathy - 3 toe amputations - after infected diabetic toe ulcers >> OM:  R 2nd toe amputated on 12/28/2011  R 3rd toe amputated on 04/25/2012  R 4th toe amputated on 07/27/2012    Plan:     1. DM2 Patient with long-standing diabetes, with improving control after adding Invokana and losing weight. Patient is still on a high dose of basal insulin, with no mealtime insulin, but appears to be doing well, except for higher sugars after dinner. -  we will continue the same regimen. She tolerates Invokana well. We have room for increasing the dose to 300 mg if we need to. She has had a history of urinary tract infections,  but only after antibiotic treatments. None recently - she knowsto let me know if she has another UTI, we might need to stop the medication if she develops recurrent UTIs. In that case, we might also try glipizide in a.m. - I advised her to: Patient Instructions  Continue current regimen.  Please work on reducing the portions and nr of carbs in your dinner. The day before your colonoscopy: take only Levemir, Metformin and Januvia (not Invokana). The night before your colonoscopy: take 25 units of Levemir. The am of your colonoscopy day, take 35 units of Novolog. Please come back for a follow-up appointment in 3 months with your sugar log. - check HbA1c today  - return to clinic in 3 months with her sugar log  Office Visit on 12/28/2012  Component Date Value Range Status  . Hemoglobin A1C 12/28/2012 7.2* 4.6 - 6.5 % Final   Glycemic Control Guidelines for People with Diabetes:Non Diabetic:  <6%Goal of Therapy: <7%Additional Action Suggested:  >8%    Best HbA1c in a long while!!!

## 2012-12-28 NOTE — Patient Instructions (Addendum)
Continue current regimen.  Please work on reducing the portions and nr of carbs in your dinner.  The day before your colonoscopy: take only Levemir, Metformin and Januvia (not Invokana). The night before your colonoscopy: take 25 units of Levemir. The am of your colonoscopy day, take 35 units of Novolog.  Please come back for a follow-up appointment in 3 months with your sugar log.  Please stop at the lb.  HAPPY HOLIDAYS!

## 2012-12-29 ENCOUNTER — Other Ambulatory Visit: Payer: Self-pay | Admitting: Internal Medicine

## 2012-12-29 ENCOUNTER — Telehealth: Payer: Self-pay | Admitting: Internal Medicine

## 2012-12-29 NOTE — Telephone Encounter (Signed)
Patient has colonoscopy scheduled with Dr. Russella Dar for Monday Is coming down with a viral infection including congestion, mild nausea, loose stool, and low-grade fever.   Would like to proceed to colonoscopy for all possible, though she is not sure she can tolerate the prep She will rest today and reevaluate tomorrow I advised her that she is still feeling poorly, we can reschedule her colonoscopy, and we should reschedule her colonoscopy if she has any fever I also told her that if cannot tolerate the whole prep, the exam would likely not be beneficial She will call me tomorrow if she does not feel better or like she can proceed with the preparation

## 2012-12-30 DIAGNOSIS — J029 Acute pharyngitis, unspecified: Secondary | ICD-10-CM | POA: Diagnosis not present

## 2012-12-30 DIAGNOSIS — J209 Acute bronchitis, unspecified: Secondary | ICD-10-CM | POA: Diagnosis not present

## 2012-12-30 DIAGNOSIS — E119 Type 2 diabetes mellitus without complications: Secondary | ICD-10-CM | POA: Diagnosis not present

## 2012-12-31 ENCOUNTER — Encounter: Payer: Self-pay | Admitting: Gastroenterology

## 2012-12-31 ENCOUNTER — Ambulatory Visit (AMBULATORY_SURGERY_CENTER): Payer: Medicare Other | Admitting: Gastroenterology

## 2012-12-31 VITALS — BP 133/88 | HR 101 | Temp 98.2°F | Resp 16 | Ht 65.5 in | Wt 183.0 lb

## 2012-12-31 DIAGNOSIS — Z1211 Encounter for screening for malignant neoplasm of colon: Secondary | ICD-10-CM | POA: Diagnosis not present

## 2012-12-31 DIAGNOSIS — E119 Type 2 diabetes mellitus without complications: Secondary | ICD-10-CM | POA: Diagnosis not present

## 2012-12-31 DIAGNOSIS — Z86010 Personal history of colonic polyps: Secondary | ICD-10-CM

## 2012-12-31 LAB — GLUCOSE, CAPILLARY
Glucose-Capillary: 118 mg/dL — ABNORMAL HIGH (ref 70–99)
Glucose-Capillary: 84 mg/dL (ref 70–99)

## 2012-12-31 MED ORDER — SODIUM CHLORIDE 0.9 % IV SOLN: 500.0000 mL | INTRAVENOUS | Status: DC

## 2012-12-31 NOTE — Progress Notes (Signed)
A/ox3 pleased with MAC, report to Karen RN 

## 2012-12-31 NOTE — Patient Instructions (Signed)
YOU HAD AN ENDOSCOPIC PROCEDURE TODAY AT THE [facility]: Refer to the procedure report that was given to you for any specific questions about what was found during the examination.  If the procedure report does not answer your questions, please call your gastroenterologist to clarify.  If you requested that your care partner not be given the details of your procedure findings, then the procedure report has been included in a sealed envelope for you to review at your convenience later.  YOU SHOULD EXPECT: Some feelings of bloating in the abdomen. Passage of more gas than usual.  Walking can help get rid of the air that was put into your GI tract during the procedure and reduce the bloating. If you had a lower endoscopy (such as a colonoscopy or flexible sigmoidoscopy) you may notice spotting of blood in your stool or on the toilet paper. If you underwent a bowel prep for your procedure, then you may not have a normal bowel movement for a few days.  DIET: Your first meal following the procedure should be a light meal and then it is ok to progress to your normal diet.  A half-sandwich or bowl of soup is an example of a good first meal.  Heavy or fried foods are harder to digest and may make you feel nauseous or bloated.  Likewise meals heavy in dairy and vegetables can cause extra gas to form and this can also increase the bloating.  Drink plenty of fluids but you should avoid alcoholic beverages for 24 hours.  ACTIVITY: Your care partner should take you home directly after the procedure.  You should plan to take it easy, moving slowly for the rest of the day.  You can resume normal activity the day after the procedure however you should NOT DRIVE or use heavy machinery for 24 hours (because of the sedation medicines used during the test).    SYMPTOMS TO REPORT IMMEDIATELY: A gastroenterologist can be reached at any hour.  During normal business hours, 8:30 AM to 5:00 PM Monday through Friday,  call [phone].  After hours and on weekends, please call the GI answering service at [phone] who will take a message and have the physician on call contact you.   Following lower endoscopy (colonoscopy or flexible sigmoidoscopy):  Excessive amounts of blood in the stool  Significant tenderness or worsening of abdominal pains  Swelling of the abdomen that is new, acute  Fever of 100F or higher    FOLLOW UP: If any biopsies were taken you will be contacted by phone or by letter within the next 1-3 weeks.  Call your gastroenterologist if you have not heard about the biopsies in 3 weeks.  Our staff will call the home number listed on your records the next business day following your procedure to check on you and address any questions or concerns that you may have at that time regarding the information given to you following your procedure. This is a courtesy call and so if there is no answer at the home number and we have not heard from you through the emergency physician on call, we will assume that you have returned to your regular daily activities without incident.  SIGNATURES/CONFIDENTIALITY: You and/or your care partner have signed paperwork which will be entered into your electronic medical record.  These signatures attest to the fact that that the information above on your After Visit Summary has been reviewed and is understood.  Full responsibility of the confidentiality   of this discharge information lies with you and/or your care-partner.     

## 2012-12-31 NOTE — Op Note (Signed)
[facility] [address] [place], [address]   COLONOSCOPY PROCEDURE REPORT  [name], Beth Holden  MR#: [id number] BIRTHDATE: 1945-03-22 , 67  yrs. old GENDER: Female ENDOSCOPIST: Meryl Dare, MD, [facility] PROCEDURE DATE:  12/31/2012 PROCEDURE:   Colonoscopy, surveillance First Screening Colonoscopy - Avg.  risk and is 50 yrs.  old or older - No.  Prior Negative Screening - Now for repeat screening. N/A  History of Adenoma - Now for follow-up colonoscopy & has been > or = to 3 yrs.  Yes hx of adenoma.  Has been 3 or more years since last colonoscopy.  Polyps Removed Today? No.  Recommend repeat exam, <10 yrs? Yes.  High risk (family or personal hx). ASA CLASS:   Class II INDICATIONS:Patient's personal history of adenomatous colon polyps.  MEDICATIONS: MAC sedation, administered by CRNA and propofol (Diprivan) 250mg  IV DESCRIPTION OF PROCEDURE:   After the risks benefits and alternatives of the procedure were thoroughly explained, informed consent was obtained.  A digital rectal exam revealed no abnormalities of the rectum.   The LB [id number]  endoscope was introduced through the anus and advanced to the cecum, which was identified by both the appendix and ileocecal valve. No adverse events experienced.   The quality of the prep was good, using MoviPrep  The instrument was then slowly withdrawn as the colon was fully examined.  COLON FINDINGS: A normal appearing cecum, ileocecal valve, and appendiceal orifice were identified.  The ascending, hepatic flexure, transverse, splenic flexure, descending, sigmoid colon and rectum appeared unremarkable.  No polyps or cancers were seen. Retroflexed views revealed no abnormalities. The time to cecum=7 minutes 20 seconds.  Withdrawal time=9 minutes 55 seconds.  The scope was withdrawn and the procedure completed.  COMPLICATIONS: There were no complications.  ENDOSCOPIC IMPRESSION: 1.  Normal  colon  RECOMMENDATIONS: 1.  Repeat Colonoscopy in 5 years.  eSigned:  Meryl Dare, MD, [facility] 12/31/2012 10:51 AM

## 2013-01-01 ENCOUNTER — Telehealth: Payer: Self-pay | Admitting: *Deleted

## 2013-01-01 NOTE — Telephone Encounter (Signed)
  Follow up Call-  Call back number 12/31/2012  Post procedure Call Back phone  # [phone]  Permission to leave phone message Yes     Patient questions:  Do you have a fever, pain , or abdominal swelling? no Pain Score  0 *  Have you tolerated food without any problems? yes  Have you been able to return to your normal activities? yes  Do you have any questions about your discharge instructions: Diet   no Medications  no Follow up visit  no  Do you have questions or concerns about your Care? no  Actions: * If pain score is 4 or above: No action needed, pain <4.

## 2013-01-04 ENCOUNTER — Other Ambulatory Visit: Payer: Self-pay | Admitting: *Deleted

## 2013-01-04 MED ORDER — INSULIN DETEMIR 100 UNIT/ML FLEXPEN: PEN_INJECTOR | SUBCUTANEOUS | Status: DC

## 2013-01-09 ENCOUNTER — Encounter: Payer: Self-pay | Admitting: Internal Medicine

## 2013-01-09 ENCOUNTER — Ambulatory Visit (INDEPENDENT_AMBULATORY_CARE_PROVIDER_SITE_OTHER): Payer: Medicare Other | Admitting: Internal Medicine

## 2013-01-09 VITALS — BP 118/70 | HR 102 | Temp 98.6°F | Wt 179.8 lb

## 2013-01-09 DIAGNOSIS — R059 Cough, unspecified: Secondary | ICD-10-CM

## 2013-01-09 DIAGNOSIS — R05 Cough: Secondary | ICD-10-CM | POA: Diagnosis not present

## 2013-01-09 MED ORDER — PROMETHAZINE-CODEINE 6.25-10 MG/5ML PO SYRP: 5.0000 mL | ORAL_SOLUTION | ORAL | Status: DC | PRN

## 2013-01-09 NOTE — Progress Notes (Signed)
Pre visit review using our clinic review tool, if applicable. No additional management support is needed unless otherwise documented below in the visit note. 

## 2013-01-09 NOTE — Patient Instructions (Signed)
Cough and drainage - no signs of active bacterial infection. The Z-pak was a very good, broad spectrum antibiotic and evidently did it's job. Plan Phenergan with codeine syrup - 1 tsp every 6 hours as needed  Continue Zyrtec  Sudafed (generic) 30 mg twice a day: AM and afternoon to relieve the pressure. This is a decongestant  Stove top vaporizer for comfort  Nasal saline spray to keep the mucus membranes moist.  Mucinex 1200 mg twice a day  Hydrate,

## 2013-01-09 NOTE — Progress Notes (Signed)
Subjective:    Patient ID: Beth Holden, female    DOB: 07/10/1945, 67 y.o.   MRN: [id number]  HPI Beth Holden with cough and sore throat. She went to Urgent Care Dec 21st - she was diagnosed with respiratory infection and strep throat and was treated with Z-pak. She saw some improvement initially but after a day or two her symptoms recurred. She has burning in the throat and ears. She has many sick family members home for the holidays.   PMH, FamHx and SocHx reviewed for any changes and relevance.  Current Outpatient Prescriptions on File Prior to Visit  Medication Sig Dispense Refill  . acetaminophen (TYLENOL) 500 MG tablet Take 500-1,000 mg by mouth every 6 (six) hours as needed for pain.       Marland Kitchen amitriptyline (ELAVIL) 25 MG tablet TAKE 1 TABLET BY MOUTH AT BEDTIME  90 tablet  0  . Canagliflozin (INVOKANA) 100 MG TABS Take 1 tablet (100 mg total) by mouth daily before breakfast.  90 tablet  3  . cetirizine (ZYRTEC) 10 MG tablet Take 10 mg by mouth daily.       . clobetasol cream (TEMOVATE) 0.05 %       . enalapril (VASOTEC) 20 MG tablet Take 20 mg by mouth daily.      Marland Kitchen esomeprazole (NEXIUM) 40 MG capsule Take 1 capsule (40 mg total) by mouth 2 (two) times daily.  60 capsule  11  . Fluocinolone Acetonide (DERMOTIC) 0.01 % OIL Place 1 drop in ear(s) daily as needed (itching).       . gabapentin (NEURONTIN) 300 MG capsule Take 300 mg by mouth daily.      Marland Kitchen glucose blood (ONE TOUCH ULTRA TEST) test strip Use as instructed. Testing up to three times a day.  100 each  11  . Insulin Detemir (LEVEMIR FLEXTOUCH) 100 UNIT/ML SOPN Inject 47 units in the am and 37 units in the pm  10 pen  5  . Insulin Pen Needle (FIFTY50 PEN NEEDLES) 31G X 5 MM MISC Use 2x a day  200 each  3  . metFORMIN (GLUCOPHAGE) 1000 MG tablet Take 1 tablet (1,000 mg total) by mouth 2 (two) times daily with a meal.  180 tablet  3  . Multiple Vitamins-Minerals (CENTRUM SILVER) tablet Take 1 tablet by mouth daily.      Marland Kitchen  nystatin-triamcinolone (MYCOLOG II) cream       . rosuvastatin (CRESTOR) 5 MG tablet Take 5 mg by mouth daily.      . sitaGLIPtin (JANUVIA) 100 MG tablet Take 1 tablet (100 mg total) by mouth daily.  90 tablet  1   No current facility-administered medications on file prior to visit.       Review of Systems System review is negative for any constitutional, cardiac, pulmonary, GI or neuro symptoms or complaints other than as described in the HPI.     Objective:   Physical Exam Filed Vitals:   01/09/13 1210  BP: 118/70  Pulse: 102  Temp: 98.6 F (37 C)   Wt Readings from Last 3 Encounters:  01/09/13 179 lb 12.8 oz (81.557 kg)  12/31/12 183 lb (83.008 kg)  12/28/12 183 lb (83.008 kg)   Gen'l - Overweight woman in no distress HEENT- C&S clear, posterior pharynx w/o erythema or exudate Nodes - negative in cervical region. Pulm - normal respirations, CTAP        Assessment & Plan:  Cough and drainage - no signs of active bacterial  infection. The Z-pak was a very good, broad spectrum antibiotic and evidently did it's job. Plan Phenergan with codeine syrup - 1 tsp every 6 hours as needed  Continue Zyrtec  Sudafed (generic) 30 mg twice a day: AM and afternoon to relieve the pressure. This is a decongestant  Stove top vaporizer for comfort  Nasal saline spray to keep the mucus membranes moist.  Mucinex 1200 mg twice a day  Hydrate,

## 2013-01-14 DIAGNOSIS — H43819 Vitreous degeneration, unspecified eye: Secondary | ICD-10-CM | POA: Diagnosis not present

## 2013-01-14 DIAGNOSIS — H40009 Preglaucoma, unspecified, unspecified eye: Secondary | ICD-10-CM | POA: Diagnosis not present

## 2013-01-14 DIAGNOSIS — E119 Type 2 diabetes mellitus without complications: Secondary | ICD-10-CM | POA: Diagnosis not present

## 2013-01-25 DIAGNOSIS — B351 Tinea unguium: Secondary | ICD-10-CM | POA: Diagnosis not present

## 2013-01-25 DIAGNOSIS — L97509 Non-pressure chronic ulcer of other part of unspecified foot with unspecified severity: Secondary | ICD-10-CM | POA: Diagnosis not present

## 2013-01-25 DIAGNOSIS — E1149 Type 2 diabetes mellitus with other diabetic neurological complication: Secondary | ICD-10-CM | POA: Diagnosis not present

## 2013-02-14 ENCOUNTER — Other Ambulatory Visit: Payer: Self-pay | Admitting: Internal Medicine

## 2013-02-14 ENCOUNTER — Other Ambulatory Visit: Payer: Self-pay

## 2013-02-14 DIAGNOSIS — Z1231 Encounter for screening mammogram for malignant neoplasm of breast: Secondary | ICD-10-CM

## 2013-03-01 ENCOUNTER — Ambulatory Visit
Admission: RE | Admit: 2013-03-01 | Discharge: 2013-03-01 | Disposition: A | Discharge status: Home / Self Care | Payer: Medicare Other | Source: Ambulatory Visit

## 2013-03-01 DIAGNOSIS — B351 Tinea unguium: Secondary | ICD-10-CM | POA: Diagnosis not present

## 2013-03-01 DIAGNOSIS — L97509 Non-pressure chronic ulcer of other part of unspecified foot with unspecified severity: Secondary | ICD-10-CM | POA: Diagnosis not present

## 2013-03-01 DIAGNOSIS — Z1231 Encounter for screening mammogram for malignant neoplasm of breast: Secondary | ICD-10-CM

## 2013-03-01 DIAGNOSIS — E1149 Type 2 diabetes mellitus with other diabetic neurological complication: Secondary | ICD-10-CM | POA: Diagnosis not present

## 2013-03-20 ENCOUNTER — Other Ambulatory Visit: Payer: Self-pay | Admitting: Internal Medicine

## 2013-03-25 DIAGNOSIS — L97509 Non-pressure chronic ulcer of other part of unspecified foot with unspecified severity: Secondary | ICD-10-CM | POA: Diagnosis not present

## 2013-03-25 DIAGNOSIS — E1149 Type 2 diabetes mellitus with other diabetic neurological complication: Secondary | ICD-10-CM | POA: Diagnosis not present

## 2013-03-28 DIAGNOSIS — L538 Other specified erythematous conditions: Secondary | ICD-10-CM | POA: Diagnosis not present

## 2013-03-28 DIAGNOSIS — L408 Other psoriasis: Secondary | ICD-10-CM | POA: Diagnosis not present

## 2013-03-29 ENCOUNTER — Ambulatory Visit: Payer: Medicare Other | Admitting: Internal Medicine

## 2013-04-15 ENCOUNTER — Ambulatory Visit (INDEPENDENT_AMBULATORY_CARE_PROVIDER_SITE_OTHER): Payer: Medicare Other | Admitting: Family Medicine

## 2013-04-15 ENCOUNTER — Encounter: Payer: Self-pay | Admitting: Family Medicine

## 2013-04-15 VITALS — BP 120/80 | Temp 98.6°F | Ht 65.5 in | Wt 184.0 lb

## 2013-04-15 DIAGNOSIS — Z7189 Other specified counseling: Secondary | ICD-10-CM | POA: Diagnosis not present

## 2013-04-15 DIAGNOSIS — E785 Hyperlipidemia, unspecified: Secondary | ICD-10-CM | POA: Diagnosis not present

## 2013-04-15 DIAGNOSIS — R300 Dysuria: Secondary | ICD-10-CM

## 2013-04-15 DIAGNOSIS — E1149 Type 2 diabetes mellitus with other diabetic neurological complication: Secondary | ICD-10-CM

## 2013-04-15 DIAGNOSIS — Z7689 Persons encountering health services in other specified circumstances: Secondary | ICD-10-CM

## 2013-04-15 DIAGNOSIS — Z23 Encounter for immunization: Secondary | ICD-10-CM

## 2013-04-15 DIAGNOSIS — I10 Essential (primary) hypertension: Secondary | ICD-10-CM | POA: Diagnosis not present

## 2013-04-15 DIAGNOSIS — K219 Gastro-esophageal reflux disease without esophagitis: Secondary | ICD-10-CM

## 2013-04-15 DIAGNOSIS — S98139A Complete traumatic amputation of one unspecified lesser toe, initial encounter: Secondary | ICD-10-CM

## 2013-04-15 DIAGNOSIS — Z89421 Acquired absence of other right toe(s): Secondary | ICD-10-CM | POA: Insufficient documentation

## 2013-04-15 DIAGNOSIS — M25519 Pain in unspecified shoulder: Secondary | ICD-10-CM

## 2013-04-15 DIAGNOSIS — Z89429 Acquired absence of other toe(s), unspecified side: Secondary | ICD-10-CM

## 2013-04-15 DIAGNOSIS — M25511 Pain in right shoulder: Secondary | ICD-10-CM | POA: Insufficient documentation

## 2013-04-15 LAB — POCT URINALYSIS DIPSTICK
Bilirubin, UA: NEGATIVE
Blood, UA: NEGATIVE
Ketones, UA: NEGATIVE
Leukocytes, UA: NEGATIVE
Nitrite, UA: NEGATIVE
Protein, UA: NEGATIVE
Spec Grav, UA: 1.015
Urobilinogen, UA: 0.2
pH, UA: 5

## 2013-04-15 LAB — MICROALBUMIN / CREATININE URINE RATIO
Creatinine,U: 35.9 mg/dL
Microalb Creat Ratio: 0.8 mg/g (ref 0.0–30.0)
Microalb, Ur: 0.3 mg/dL (ref 0.0–1.9)

## 2013-04-15 LAB — LIPID PANEL
Cholesterol: 140 mg/dL (ref 0–200)
HDL: 57.2 mg/dL (ref >39.00)
LDL Cholesterol: 68 mg/dL (ref 0–99)
Total CHOL/HDL Ratio: 2
Triglycerides: 72 mg/dL (ref 0.0–149.0)
VLDL: 14.4 mg/dL (ref 0.0–40.0)

## 2013-04-15 LAB — BASIC METABOLIC PANEL
BUN: 17 mg/dL (ref 6–23)
CO2: 29 mEq/L (ref 19–32)
Calcium: 10.1 mg/dL (ref 8.4–10.5)
Chloride: 99 mEq/L (ref 96–112)
Creatinine, Ser: 0.6 mg/dL (ref 0.4–1.2)
GFR: 106.46 mL/min (ref >60.00)
Glucose, Bld: 73 mg/dL (ref 70–99)
Potassium: 4.3 mEq/L (ref 3.5–5.1)
Sodium: 137 mEq/L (ref 135–145)

## 2013-04-15 LAB — HEMOGLOBIN A1C: Hgb A1c MFr Bld: 7.4 % — ABNORMAL HIGH (ref 4.6–6.5)

## 2013-04-15 MED ORDER — LOVASTATIN 20 MG PO TABS: 20.0000 mg | ORAL_TABLET | Freq: Every day | ORAL | Status: DC

## 2013-04-15 NOTE — Patient Instructions (Signed)
-  We have ordered labs or studies at this visit. It can take up to 1-2 weeks for results and processing. We will contact you with instructions IF your results are abnormal. Normal results will be released to your [facility]. If you have not heard from [place] or can not find your results in [facility] in 2 weeks please contact our office.  -PLEASE SIGN UP FOR MYCHART TODAY   We recommend the following healthy lifestyle measures: - eat a healthy diet consisting of lots of vegetables, fruits, beans, nuts, seeds, healthy meats such as white chicken and fish and whole grains.  - avoid fried foods, fast food, processed foods, sodas, red meet and other fattening foods.  - get a least 150 minutes of aerobic exercise per week.   -stop the crestor, start the lovastatin - should be able to get WITHOUT insurance at [place] for $10 per month  Follow up in: next 3-4 months for medicare exam

## 2013-04-15 NOTE — Progress Notes (Addendum)
Chief Complaint  Patient presents with  . Establish Care    HPI:  Beth Holden is here to establish care. Transfer from Dr. Linda Hedges. Running out of medication as is in donut hole - is going to see endocrinologist about this. Last PCP and physical:  Has the following chronic problems and concerns today:  Patient Active Problem List   Diagnosis Date Noted  . S/P amputation of lesser toe - followed by Dr. Sharol Given 04/15/2013  . Pain in joint, shoulder region, s/p RTC surgery - followed by Dr. Sharol Given 04/15/2013  . ANEMIA, MILD 02/04/2010  . Unspecified essential hypertension 12/05/2006  . Type II diabetes with neurological manifestations, uncontrolled(250.62) - followed by endocrine, Dr. Cruzita Lederer 10/12/2006  . HYPERLIPIDEMIA 10/12/2006  . ALLERGIC RHINITIS 10/12/2006  . GERD - Followed by Dr. Fuller Plan 10/12/2006  . IRRITABLE BOWEL SYNDROME - followed by Dr. Fuller Plan 10/12/2006  . DIABETIC PERIPHERAL NEUROPATHY 10/11/2006  . ALOPECIA NEC 10/11/2006   DM/Diabetic Neuropathy: -takes antitriptyiline and gabapentin, followed by endocrine -paresthesia in extremeties, pain in shoulder and foot followed by specialists -diabetes is uncontrolled  Allergic Rhinitis: -taking zyrtec for nasal congestion, sneezing  HLD: -on crestor - too expensive  Health Maintenance: -needs pneumonia vaccine -needs diabetes follow up with foot exam  Dysuria: -dysuria, fequency, urgency for a few days, wants to check urine culture and dip  ROS: See pertinent positives and negatives per HPI.  Past Medical History  Diagnosis Date  . Erosive esophagitis   . Peripheral neuropathy   . IBS (irritable bowel syndrome)   . Hyperlipemia     takes Crestor daily  . ALOPECIA NEC   . Seasonal allergies     takes Zyrtec daily  . Headache(784.0)     occasionally;r/t sinus   . Joint pain   . History of colon polyps   . Diabetes mellitus type II     takes Metformin and Flexpen daily  . Hypertension     takes Enalapril  daily  . GERD (gastroesophageal reflux disease)     takes Nexium bid  . Osteomyelitis   . Insomnia     takes Elavil nightly and as well as neuropathy  . Peripheral neuropathy   . Diverticulosis   . ALLERGIC RHINITIS     takes Zyrtec daily    Family History  Problem Relation Age of Onset  . Lung cancer Mother 45  . Cancer Mother     lung  . Hypertension Father   . Hyperlipidemia Father   . Diabetes Father   . Coronary artery disease Father   . Dementia Father   . Stomach cancer Paternal Aunt   . Cancer Paternal Aunt     stomach  . Brain cancer Paternal Uncle   . Cancer Paternal Uncle     brain  . Colon cancer Neg Hx   . Irritable bowel syndrome      Several family members on fathers side   . Diabetes Other   . Cancer Maternal Aunt     stomach    History   Social History  . Marital Status: Married    Spouse Name: N/A    Number of Children: 2  . Years of Education: N/A   Occupational History  . Retired     Social History Main Topics  . Smoking status: Never Smoker   . Smokeless tobacco: Never Used  . Alcohol Use: No  . Drug Use: No  . Sexual Activity: Yes    Birth Control/ Protection: Surgical  Other Topics Concern  . 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 , Disp: , Rfl: ;  clobetasol cream (TEMOVATE) 0.05 %, , Disp: , Rfl: ;  esomeprazole (NEXIUM) 40 MG capsule, Take 1 capsule (40 mg total) by mouth 2 (two) times daily., Disp: 60 capsule, Rfl: 11 Fluocinolone Acetonide (DERMOTIC) 0.01 % OIL, Place 1 drop in ear(s) daily as needed (itching). , Disp: , Rfl: ;  gabapentin (NEURONTIN) 300  MG capsule, TAKE ONE CAPSULE BY MOUTH EVERY DAY, Disp: 90 capsule, Rfl: 3;  metFORMIN (GLUCOPHAGE) 1000 MG tablet, Take 1 tablet (1,000 mg total) by mouth 2 (two) times daily with a meal., Disp: 180 tablet, Rfl: 3;  Multiple Vitamins-Minerals (CENTRUM SILVER) tablet, Take 1 tablet by mouth daily., Disp: , Rfl:  nystatin-triamcinolone (MYCOLOG II) cream, , Disp: , Rfl: ;  enalapril (VASOTEC) 20 MG tablet, Take 1 tablet (20 mg total) by mouth daily., Disp: 30 tablet, Rfl: 1;  glucose blood (ONE TOUCH ULTRA TEST) test strip, Use to test blood sugar 4 times daily as instructed. Dx code: 59.62, Disp: 150 each, Rfl: 11;  insulin NPH Human (NOVOLIN N RELION) 100 UNIT/ML injection, Inject under skin 50 units daily as advised. Dx code: 62.62, Disp: 20 mL, Rfl: 3 insulin regular (NOVOLIN R RELION) 100 units/mL injection, Inject 20 units daily as advised. Dx code: 41.62, Disp: 10 mL, Rfl: 3;  Insulin Syringe-Needle U-100 31G X 5/16" 1 ML MISC, Use 2x a day, Disp: 100 each, Rfl: 11;  lovastatin (MEVACOR) 20 MG tablet, Take 1 tablet (20 mg total) by mouth at bedtime., Disp: 90 tablet, Rfl: 3 nitrofurantoin, macrocrystal-monohydrate, (MACROBID) 100 MG capsule, Take 1 capsule (100 mg total) by mouth 2 (two) times daily., Disp: 14 capsule, Rfl: 0  EXAM:  Filed Vitals:   04/15/13 1054  BP: 120/80  Temp: 98.6 F (37 C)    Body mass index is 30.14 kg/(m^2).  GENERAL: vitals reviewed and listed above, alert, oriented, appears well hydrated and in no acute distress  HEENT: atraumatic, conjunttiva clear, no obvious abnormalities on inspection of external nose and ears  NECK: no obvious masses on inspection  LUNGS: clear to auscultation bilaterally, no wheezes, rales or rhonchi, good air movement  CV: HRRR, no peripheral edema  MS: moves all extremities without noticeable abnormality  PSYCH: pleasant and cooperative, no obvious depression or anxiety  ASSESSMENT AND PLAN:  Discussed the following  assessment and plan:  Encounter to establish care with new doctor  Type II diabetes with neurological manifestations, uncontrolled(250.62) - Plan: Hemoglobin U3J, Basic metabolic panel, Microalbumin/Creatinine Ratio, Urine  HYPERLIPIDEMIA - Plan: lovastatin (MEVACOR) 20 MG tablet, Lipid Panel  Unspecified essential hypertension  GERD  S/P amputation of lesser toe - followed by Dr. Sharol Given  Pain in joint, shoulder region, s/p RTC surgery - followed by Dr. Sharol Given  Dysuria - Plan: Urine culture, POCT urinalysis dipstick, CANCELED: POC urinalysis w microscopic (non auto)  Need for prophylactic vaccination against Streptococcus pneumoniae (pneumococcus) - Plan: Pneumococcal conjugate vaccine 13-valent IM  -We reviewed the PMH,  PSH, FH, SH, Meds and Allergies. -We provided refills for any medications we will prescribe as needed. -We addressed current concerns per orders and patient instructions. -We have asked for records for pertinent exams, studies, vaccines and notes from previous providers. -We have advised patient to follow up per instructions below. -refills for diabetic meds until sees diabetes doctor -changed to lovastatin as on $list [facility] and [facility] -prevnar 61 today and 23 in 1 year -she will do diabetic foot exam with her diabetes doctor -FASTING labs today and per her request will forward to Dr. Renne Crigler -follow up for medicare exam in next 3-4 months  -Patient advised to return or notify a doctor immediately if symptoms worsen or persist or new concerns arise.  Patient Instructions  -We have ordered labs or studies at this visit. It can take up to 1-2 weeks for results and processing. We will contact you with instructions IF your results are abnormal. Normal results will be released to your [facility]. If you have not heard from [place] or can not find your results in [facility] in 2 weeks please contact our office.  -PLEASE SIGN UP FOR MYCHART TODAY   We recommend the following  healthy lifestyle measures: - eat a healthy diet consisting of lots of vegetables, fruits, beans, nuts, seeds, healthy meats such as white chicken and fish and whole grains.  - avoid fried foods, fast food, processed foods, sodas, red meet and other fattening foods.  - get a least 150 minutes of aerobic exercise per week.   -stop the crestor, start the lovastatin - should be able to get WITHOUT insurance at [employer] [facility] [employer] for $10 per month  Follow up in: next 3-4 months for medicare exam      Rasheeda Mulvehill R.

## 2013-04-16 ENCOUNTER — Telehealth: Payer: Self-pay | Admitting: Family Medicine

## 2013-04-16 NOTE — Telephone Encounter (Signed)
Relevant patient education assigned to patient using [facility]i. ° °

## 2013-04-17 MED ORDER — CIPROFLOXACIN HCL 500 MG PO TABS: 500.0000 mg | ORAL_TABLET | Freq: Two times a day (BID) | ORAL | Status: DC

## 2013-04-17 NOTE — Addendum Note (Signed)
Addended by: Colleen Can on: 04/17/2013 12:50 PM   Modules accepted: Orders

## 2013-04-18 LAB — URINE CULTURE: Colony Count: >=100000

## 2013-04-19 DIAGNOSIS — L97509 Non-pressure chronic ulcer of other part of unspecified foot with unspecified severity: Secondary | ICD-10-CM | POA: Diagnosis not present

## 2013-04-19 DIAGNOSIS — E1149 Type 2 diabetes mellitus with other diabetic neurological complication: Secondary | ICD-10-CM | POA: Diagnosis not present

## 2013-04-26 DIAGNOSIS — L97509 Non-pressure chronic ulcer of other part of unspecified foot with unspecified severity: Secondary | ICD-10-CM | POA: Diagnosis not present

## 2013-04-26 DIAGNOSIS — E1149 Type 2 diabetes mellitus with other diabetic neurological complication: Secondary | ICD-10-CM | POA: Diagnosis not present

## 2013-04-29 ENCOUNTER — Telehealth: Payer: Self-pay

## 2013-04-29 NOTE — Telephone Encounter (Signed)
Relevant patient education assigned to patient using [facility]i. ° °

## 2013-05-03 ENCOUNTER — Encounter: Payer: Self-pay | Admitting: Internal Medicine

## 2013-05-03 ENCOUNTER — Ambulatory Visit (INDEPENDENT_AMBULATORY_CARE_PROVIDER_SITE_OTHER): Payer: Medicare Other | Admitting: Internal Medicine

## 2013-05-03 VITALS — BP 112/62 | HR 104 | Temp 97.7°F | Resp 12 | Wt 181.0 lb

## 2013-05-03 DIAGNOSIS — E1149 Type 2 diabetes mellitus with other diabetic neurological complication: Secondary | ICD-10-CM | POA: Diagnosis not present

## 2013-05-03 DIAGNOSIS — L97509 Non-pressure chronic ulcer of other part of unspecified foot with unspecified severity: Secondary | ICD-10-CM | POA: Diagnosis not present

## 2013-05-03 MED ORDER — INSULIN REGULAR HUMAN 100 UNIT/ML IJ SOLN: INTRAMUSCULAR | Status: DC

## 2013-05-03 MED ORDER — "INSULIN SYRINGE-NEEDLE U-100 31G X 5/16"" 1 ML MISC": Status: DC

## 2013-05-03 MED ORDER — INSULIN NPH (HUMAN) (ISOPHANE) 100 UNIT/ML ~~LOC~~ SUSP: SUBCUTANEOUS | Status: DC

## 2013-05-03 NOTE — Patient Instructions (Signed)
Please stop Januvia and Invokana. Continue Metformin 1000 mg 2x a day. Stop Levemir. Start ReliOn N (long acting) and R (short acting) insulins as follows:  Insulin Before breakfast Before lunch Before dinner  Regular 10 x 10  NPH 25 x 25   Please inject the insulin 30 min before meals. Please see separate handout about mixing the insulins. Please return in 1 month with your sugar log.
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
## 2013-05-07 ENCOUNTER — Telehealth: Payer: Self-pay | Admitting: *Deleted

## 2013-05-07 ENCOUNTER — Other Ambulatory Visit: Payer: Self-pay | Admitting: *Deleted

## 2013-05-07 ENCOUNTER — Other Ambulatory Visit: Payer: Self-pay | Admitting: Internal Medicine

## 2013-05-07 MED ORDER — INSULIN REGULAR HUMAN 100 UNIT/ML IJ SOLN: INTRAMUSCULAR | Status: DC

## 2013-05-07 MED ORDER — INSULIN NPH (HUMAN) (ISOPHANE) 100 UNIT/ML ~~LOC~~ SUSP: SUBCUTANEOUS | Status: DC

## 2013-05-07 NOTE — Telephone Encounter (Signed)
Pt called wants rx sent to [facility] and rx needs dx code per pharmacy. Done.

## 2013-05-23 ENCOUNTER — Other Ambulatory Visit: Payer: Self-pay | Admitting: Family Medicine

## 2013-05-23 ENCOUNTER — Telehealth: Payer: Self-pay | Admitting: *Deleted

## 2013-05-23 ENCOUNTER — Telehealth: Payer: Self-pay | Admitting: Family Medicine

## 2013-05-23 MED ORDER — GLUCOSE BLOOD VI STRP: ORAL_STRIP | Status: DC

## 2013-05-23 MED ORDER — ENALAPRIL MALEATE 20 MG PO TABS: 20.0000 mg | ORAL_TABLET | Freq: Every day | ORAL | Status: DC

## 2013-05-23 NOTE — Telephone Encounter (Signed)
Pt called and lvm stating that since starting her long and short term insulin she has had lows 5-6 times. Pt states she has become shaky, hot and heart racing. Pt states that her sugar levels drop into the 50's. Pt states she is eating and being careful about measuring the correct dosage of insulin. Pt is concerned. Please advise.

## 2013-05-23 NOTE — Telephone Encounter (Signed)
Pt req rx enalapril (VASOTEC) 20 MG tablet

## 2013-05-23 NOTE — Telephone Encounter (Signed)
Called pt and advised her per Dr Arman Filter note:  Decrease dose: Before breakfast: N 20 units + R 8 units: lunch: none and Before dinner: N 20 units + R 8 units.  Pt understood. Will call if she has any more problems, otherwise she will see [place] on her follow up appt. On 5/29.

## 2013-05-23 NOTE — Telephone Encounter (Signed)
Called pt and asked her when are these low episodes happening. Pt stated mostly in the morning, between 10 and 12. But she has a had a couple in the evening. Yesterday was around 10:30 am, her bg was 50. This one concerned her the most. [place] 9[place]30 pm. She said most days her numbers are much better. She checked and her 30 day average is around 120. Please advise.

## 2013-05-23 NOTE — Telephone Encounter (Signed)
Beth Holden, please try to find out when are these episodes happening as she will need adjustment of the insulin doses before that time of the day.

## 2013-05-23 NOTE — Telephone Encounter (Signed)
Let's decrease the doses as follows:  Before breakfast: N 20 units + R 8 units Before lunch: none Before dinner: N 20 units + R 8 units  Call back on Monday to let us know.

## 2013-05-23 NOTE — Telephone Encounter (Signed)
[id number] - [place], [place] is requesting re-fill on enalapril (VASOTEC) 20 MG tablet 90 days

## 2013-06-07 ENCOUNTER — Encounter: Payer: Self-pay | Admitting: Internal Medicine

## 2013-06-07 ENCOUNTER — Ambulatory Visit (INDEPENDENT_AMBULATORY_CARE_PROVIDER_SITE_OTHER): Payer: Medicare Other | Admitting: Internal Medicine

## 2013-06-07 VITALS — BP 104/64 | HR 104 | Temp 98.1°F | Resp 12 | Wt 187.6 lb

## 2013-06-07 DIAGNOSIS — E1149 Type 2 diabetes mellitus with other diabetic neurological complication: Secondary | ICD-10-CM | POA: Diagnosis not present

## 2013-06-07 NOTE — Progress Notes (Signed)
Subjective:     Patient ID: Beth Holden, female   DOB: 03-28-1945, 68 y.o.   MRN: [id number]  HPI Beth Holden is a pleasant 68 y.o. 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 Last visit 1 mo ago.  Last HbA1C was: Lab Results  Component Value Date   HGBA1C 7.4* 04/15/2013   HGBA1C 7.2* 12/28/2012   HGBA1C 8.3* 08/08/2012   Beth Holden was on the following meds, but Beth Holden was in the doughnut hole >> we stopped in 04/2013: - Metformin 1000 mg bid - Levemir 47 units in am and 37 units in pm >> $800 for 3 mo supply - Januvia 100 mg daily - $450 for 3 mo - Invokana 100 mg - $455 for 3 mo We discussed about starting Beth Holden on a VGo mechanical pump in the past. Beth Holden had an appointment with diabetes education but decided not to pursue it.  Beth Holden is now on: Metformin 1000 mg bid ReliOn N (long acting) and R (short acting) insulins as follows: Insulin Before breakfast Before lunch Before dinner  Regular 10 x 10  NPH 25 x 25   I reviewed tel notes since last visit: 05/23/2013: Pt called and lvm stating that since starting Beth Holden long and short term insulin Beth Holden has had lows 5-6 times. Pt states Beth Holden has become shaky, hot and heart racing. Pt states that Beth Holden sugar levels drop into the 50's. Pt states Beth Holden is eating and being careful about measuring the correct dosage of insulin. Pt is concerned. Please advise. Let's decrease the doses as follows:  Before breakfast: N 20 units + R 8 units Before lunch: none Before dinner: N 20 units + R 8 units Call back on Monday to let us know.  Beth Holden checks Beth Holden sugars 1-3x a day - forgot log: - am: 95 - 200 >> 86-139 >> 70s-100 (depends on dinner), occas. 170 >> 83-155 >> 71-197 >> 82-157 >> 96-170 (201) - 2h after b'fast 138-160 >> after b'fast: 141 >> 145-177 >> 122-185 >> n/c >> 141-183 (248) - prelunch: 80-124 >> 120-184 >> n/c >> 106,  157 - 2-3h after lunch: 170-200 >> 125-215 >>70--174 >> 150-200 >> 92-175 - before dinner: 75-166 >> 94-151 >> n/c >> 116, 139 - after dinner: <200 >> 196 >> 208-318 >> 180-225 >> 123, 141, 240 - bedtime: 200s >> 159-232 >> 101-204 >> n/c >> 77, 193, 205 Lowest: 51, before we decreased insulin doses. Has hypoglycemia in the 70's.   Meals: - Breakfast: egg, bacon, toast; grits; biscuit; fruit; sometimes skips - Lunch: 1/2 PB sandwich or soup and yoghurt, sometimes grackers - Dinner: meat + vegetables + some starch - Snacks: fruit   - last eye exam in 11/2012: no DR, no glaucoma  - Dr Zadie Rhine - No CKD. Beth Holden is on Enalapril. BUN  Date Value Ref Range Status  04/15/2013 17  6 - 23 mg/dL Final     Creatinine, Ser  Date Value Ref Range Status  04/15/2013 0.6  0.4 - 1.2 mg/dL Final  - Has numbness and tingling in feet - on Gabapentin and Amitriptyline, will start tapering them down.  Review of Systems Constitutional: no weight gain/loss, no fatigue, + poor sleep Eyes: no blurry vision, no xerophthalmia ENT: no sore throat, no nodules palpated in throat, no dysphagia/odynophagia, no hoarseness Cardiovascular: no CP/SOB/palpitations/leg swelling Respiratory: no cough/no SOB Gastrointestinal: no N/V/D/C Musculoskeletal: no muscle/joint aches Skin:  no rashes  I reviewed pt's medications, allergies, PMH, social hx, family hx and no changes required, except as above.  Objective:   Physical Exam BP 104/64  Pulse 104  Temp(Src) 98.1 F (36.7 C) (Oral)  Resp 12  Wt 187 lb 9.6 oz (85.095 kg)  SpO2 96% Wt Readings from Last 3 Encounters:  06/07/13 187 lb 9.6 oz (85.095 kg)  05/03/13 181 lb (82.101 kg)  04/15/13 184 lb (83.462 kg)   Constitutional: overweight, in NAD Eyes: PERRLA, EOMI, no exophthalmos ENT: moist mucous membranes, no thyromegaly, no cervical lymphadenopathy Cardiovascular: RRR, No MRG Respiratory: CTA B Gastrointestinal: abdomen soft, NT, ND, BS+ Musculoskeletal:  strength intact in all 4; missing right toes: 2,3,4 Skin: moist, warm, no rashes  Assessment:     1. DM2, uncontrolled, insulin-dependent, with complications - diabetic peripheral neuropathy - 3 toe amputations - after infected diabetic toe ulcers >> OM:  R 2nd toe amputated on 12/28/2011  R 3rd toe amputated on 04/25/2012  R 4th toe amputated on 07/27/2012    Plan:     1. DM2 Patient with long-standing diabetes, with slightly increased sugars after we reduced the N/R dose. - I advised Beth Holden to:   Patient Instructions   Please change the ReliOn N (long acting) and R (short acting) insulins as follows: Insulin Before breakfast Before lunch Before dinner  Regular  6 units with a small meal  8 units with a regular meal  10 units with a large meal x  6 units with a small meal  8 units with a regular meal  10 units with a large meal  NPH 20  x 20    Please return in 1.5 month with your sugar log.  - We will likely need lunchtime R insulin, too, at next visit - return to clinic in 1.6 month with Beth Holden sugar log

## 2013-06-07 NOTE — Patient Instructions (Signed)
Please change the ReliOn N (long acting) and R (short acting) insulins as follows: Insulin Before breakfast Before lunch Before dinner  Regular  6 units with a small meal  8 units with a regular meal  10 units with a large meal x  6 units with a small meal  8 units with a regular meal  10 units with a large meal  NPH 20  x 20    Please return in 1.5 month with your sugar log.

## 2013-06-10 ENCOUNTER — Encounter: Payer: Self-pay | Admitting: Family Medicine

## 2013-06-10 ENCOUNTER — Ambulatory Visit (INDEPENDENT_AMBULATORY_CARE_PROVIDER_SITE_OTHER): Payer: Medicare Other | Admitting: Family Medicine

## 2013-06-10 VITALS — BP 110/70 | HR 103 | Temp 98.0°F | Ht 65.5 in | Wt 191.0 lb

## 2013-06-10 DIAGNOSIS — M549 Dorsalgia, unspecified: Secondary | ICD-10-CM

## 2013-06-10 DIAGNOSIS — R300 Dysuria: Secondary | ICD-10-CM | POA: Diagnosis not present

## 2013-06-10 DIAGNOSIS — R319 Hematuria, unspecified: Secondary | ICD-10-CM

## 2013-06-10 LAB — POCT URINALYSIS DIPSTICK
Bilirubin, UA: NEGATIVE
Glucose, UA: NEGATIVE
Ketones, UA: NEGATIVE
Nitrite, UA: NEGATIVE
Protein, UA: NEGATIVE
Spec Grav, UA: 1.015
Urobilinogen, UA: 0.2
pH, UA: 6

## 2013-06-10 MED ORDER — NITROFURANTOIN MONOHYD MACRO 100 MG PO CAPS: 100.0000 mg | ORAL_CAPSULE | Freq: Two times a day (BID) | ORAL | Status: DC

## 2013-06-10 NOTE — Progress Notes (Signed)
No chief complaint on file.   HPI:  Acute visit for:  Dysuria: -hx UTI in the beginning of April 2015 treated with cipro -reports: symptoms returned 3 days ago - irritated with urination, dysuria, mild hematuria -denies: fever, flank pain, nausea, vomiting, pelvic pain - does have some mid back pain   ROS: See pertinent positives and negatives per HPI.  Past Medical History  Diagnosis Date  . Erosive esophagitis   . Peripheral neuropathy   . IBS (irritable bowel syndrome)   . Hyperlipemia     takes Crestor daily  . ALOPECIA NEC   . Seasonal allergies     takes Zyrtec daily  . Headache(784.0)     occasionally;r/t sinus   . Joint pain   . History of colon polyps   . Diabetes mellitus type II     takes Metformin and Flexpen daily  . Hypertension     takes Enalapril daily  . GERD (gastroesophageal reflux disease)     takes Nexium bid  . Osteomyelitis   . Insomnia     takes Elavil nightly and as well as neuropathy  . Peripheral neuropathy   . Diverticulosis   . ALLERGIC RHINITIS     takes Zyrtec daily    Past Surgical History  Procedure Laterality Date  . Vaginal hysterectomy  2001  . Oophorectomy  2001  . Tubal ligation    . Rotator cuff repair Right   . Laparoscopic appendectomy  01/05/2011    Procedure: APPENDECTOMY LAPAROSCOPIC;  Surgeon: Pedro Earls, MD;  Location: [facility];  Service: General;  Laterality: N/A;  . Appendectomy  2012  . Colonoscopy    . Toe amputation  12/28/2011    rt second toe  . Amputation  12/28/2011    Procedure: AMPUTATION DIGIT;  Surgeon: Newt Minion, MD;  Location: [place];  Service: Orthopedics;  Laterality: Right;  Right Foot 2nd Toe Amputation at MTP (metatarsophalangeal joint)  . Amputation Right 04/25/2012    Procedure: Right Foot 3rd Toe Amputation;  Surgeon: Newt Minion, MD;  Location: [place];  Service: Orthopedics;  Laterality: Right;  Right Foot Third Toe Amputation   . Amputation Right 07/27/2012    Procedure: Right 4th  Toe Amputation at Metatarsophalangeal;  Surgeon: Newt Minion, MD;  Location: [facility];  Service: Orthopedics;  Laterality: Right;  Right 4th Toe Amputation at Metatarsophalangeal    Family History  Problem Relation Age of Onset  . Lung cancer Mother 25  . Cancer Mother     lung  . Hypertension Father   . Hyperlipidemia Father   . Diabetes Father   . Coronary artery disease Father   . Dementia Father   . Stomach cancer Paternal Aunt   . Cancer Paternal Aunt     stomach  . Brain cancer Paternal Uncle   . Cancer Paternal Uncle     brain  . Colon cancer Neg Hx   . Irritable bowel syndrome      Several family members on fathers side   . Diabetes Other   . Cancer Maternal Aunt     stomach    History   Social History  . Marital Status: Married    Spouse Name: N/A    Number of Children: 2  . Years of Education: N/A   Occupational History  . Retired     Social History Main Topics  . Smoking status: Never Smoker   . Smokeless tobacco: Never Used  . Alcohol Use: No  .  Drug Use: No  . Sexual Activity: Yes    Birth Control/ Protection: Surgical   Other Topics Concern  . 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 , Disp: , Rfl: ;  clobetasol cream (TEMOVATE) 0.05 %, , Disp: , Rfl: ;  enalapril (VASOTEC) 20 MG tablet, Take 20 mg by mouth daily., Disp: , Rfl:  esomeprazole (NEXIUM) 40 MG capsule, Take 1 capsule (40 mg total) by mouth 2 (two) times daily., Disp: 60 capsule, Rfl: 11;  Fluocinolone Acetonide (DERMOTIC) 0.01 % OIL, Place 1 drop in ear(s) daily as needed (itching). , Disp: , Rfl: ;  gabapentin  (NEURONTIN) 300 MG capsule, TAKE ONE CAPSULE BY MOUTH EVERY DAY, Disp: 90 capsule, Rfl: 3 glucose blood (ONE TOUCH ULTRA TEST) test strip, Use to test blood sugar 4 times daily as instructed. Dx code: 53.62, Disp: 150 each, Rfl: 11;  insulin NPH Human (NOVOLIN N RELION) 100 UNIT/ML injection, Inject under skin 50 units daily as advised. Dx code: 66.62, Disp: 20 mL, Rfl: 11;  insulin regular (NOVOLIN R RELION) 100 units/mL injection, Inject 20 units daily as advised. Dx code: 23.62, Disp: 10 mL, Rfl: 11 Insulin Syringe-Needle U-100 31G X 5/16" 1 ML MISC, Use 2x a day, Disp: 100 each, Rfl: 11;  lovastatin (MEVACOR) 20 MG tablet, Take 1 tablet (20 mg total) by mouth at bedtime., Disp: 90 tablet, Rfl: 3;  metFORMIN (GLUCOPHAGE) 1000 MG tablet, Take 1 tablet (1,000 mg total) by mouth 2 (two) times daily with a meal., Disp: 180 tablet, Rfl: 3;  Multiple Vitamins-Minerals (CENTRUM SILVER) tablet, Take 1 tablet by mouth daily., Disp: , Rfl:  nystatin-triamcinolone (MYCOLOG II) cream, , Disp: , Rfl: ;  nitrofurantoin, macrocrystal-monohydrate, (MACROBID) 100 MG capsule, Take 1 capsule (100 mg total) by mouth 2 (two) times daily., Disp: 14 capsule, Rfl: 0  EXAM:  Filed Vitals:   06/10/13 1118  BP: 110/70  Pulse: 103  Temp: 98 F (36.7 C)    Body mass index is 31.29 kg/(m^2).  GENERAL: vitals reviewed and listed above, alert, oriented, appears well hydrated and in no acute distress  HEENT: atraumatic, conjunttiva clear, no obvious abnormalities on inspection of external nose and ears  NECK: no obvious masses on inspection  LUNGS: clear to auscultation bilaterally, no wheezes, rales or rhonchi, good air movement  CV: HRRR, no peripheral edema  MS: moves all extremities without noticeable abnormality  ABD: no CVA TTP  PSYCH: pleasant and cooperative, no obvious depression or anxiety  ASSESSMENT AND PLAN:  Discussed the following assessment and plan:  Dysuria - Plan: nitrofurantoin,  macrocrystal-monohydrate, (MACROBID) 100 MG capsule  Back pain - Plan: POC Urinalysis Dipstick, CULTURE, URINE COMPREHENSIVE  -tx with abx, urine culture, may refer to urology if recurrent or if hematuria without UTI on culture -risks and return precuations discussed, back pain seems more paraspinal and not related - likely from lifting grandkids -Patient advised to return or notify a doctor immediately if symptoms worsen or persist or new concerns arise.  There are no Patient Instructions on file  for this visit.   Beth Holden

## 2013-06-10 NOTE — Progress Notes (Signed)
Pre visit review using our clinic review tool, if applicable. No additional management support is needed unless otherwise documented below in the visit note. 

## 2013-06-12 ENCOUNTER — Telehealth: Payer: Self-pay | Admitting: *Deleted

## 2013-06-12 ENCOUNTER — Other Ambulatory Visit: Payer: Self-pay | Admitting: *Deleted

## 2013-06-12 LAB — CULTURE, URINE COMPREHENSIVE
Colony Count: NO GROWTH
Organism ID, Bacteria: NO GROWTH

## 2013-06-12 MED ORDER — INSULIN NPH (HUMAN) (ISOPHANE) 100 UNIT/ML ~~LOC~~ SUSP: SUBCUTANEOUS | Status: DC

## 2013-06-12 MED ORDER — INSULIN REGULAR HUMAN 100 UNIT/ML IJ SOLN: INTRAMUSCULAR | Status: DC

## 2013-06-12 NOTE — Telephone Encounter (Signed)
Spoke with pt (only 1 time) and her rx was sent to another pharmacy back on 4/28, with the diagnosis code. Spoke with pharmacy tech. Resending the rx for her insulins to [place], while pt is there.

## 2013-06-12 NOTE — Addendum Note (Signed)
Addended by: Agnes Lawrence on: 06/12/2013 10:38 AM   Modules accepted: Orders

## 2013-06-12 NOTE — Telephone Encounter (Signed)
Has spoken to you x2 and she is at the pharmacy to get her insulin new insulin patient she needs her medication Asap very upset need diagnostic code, insulin is $50 to get it it would be cheaper if she has the code please call her if possible

## 2013-06-13 ENCOUNTER — Other Ambulatory Visit: Payer: Self-pay | Admitting: *Deleted

## 2013-06-13 MED ORDER — ENALAPRIL MALEATE 20 MG PO TABS: 20.0000 mg | ORAL_TABLET | Freq: Every day | ORAL | Status: DC

## 2013-06-13 NOTE — Telephone Encounter (Signed)
Has this been addressed?

## 2013-06-24 DIAGNOSIS — L97509 Non-pressure chronic ulcer of other part of unspecified foot with unspecified severity: Secondary | ICD-10-CM | POA: Diagnosis not present

## 2013-06-24 DIAGNOSIS — E1149 Type 2 diabetes mellitus with other diabetic neurological complication: Secondary | ICD-10-CM | POA: Diagnosis not present

## 2013-06-27 ENCOUNTER — Telehealth: Payer: Self-pay | Admitting: Family Medicine

## 2013-06-27 NOTE — Telephone Encounter (Signed)
[id number] - [place], [place] is requesting 90 day re-fill on amitriptyline (ELAVIL) 25 MG tablet

## 2013-06-28 ENCOUNTER — Other Ambulatory Visit: Payer: Self-pay | Admitting: *Deleted

## 2013-06-28 MED ORDER — AMITRIPTYLINE HCL 25 MG PO TABS: ORAL_TABLET | ORAL | Status: DC

## 2013-06-28 NOTE — Telephone Encounter (Signed)
Dr Maudie Mercury originally sent the refill for Amitriptyline to [facility] on [address] and [facility] [address]  requesting the same medication.  I called [facility] and spoke with the pharmacist Jimmie Molly and cancelled the Rx and I [facility] at [phone].

## 2013-07-10 DIAGNOSIS — E1149 Type 2 diabetes mellitus with other diabetic neurological complication: Secondary | ICD-10-CM | POA: Diagnosis not present

## 2013-07-10 DIAGNOSIS — L97509 Non-pressure chronic ulcer of other part of unspecified foot with unspecified severity: Secondary | ICD-10-CM | POA: Diagnosis not present

## 2013-07-16 DIAGNOSIS — H43819 Vitreous degeneration, unspecified eye: Secondary | ICD-10-CM | POA: Diagnosis not present

## 2013-07-16 DIAGNOSIS — H40009 Preglaucoma, unspecified, unspecified eye: Secondary | ICD-10-CM | POA: Diagnosis not present

## 2013-07-16 DIAGNOSIS — E119 Type 2 diabetes mellitus without complications: Secondary | ICD-10-CM | POA: Diagnosis not present

## 2013-07-19 ENCOUNTER — Encounter: Payer: Self-pay | Admitting: Family Medicine

## 2013-07-19 ENCOUNTER — Ambulatory Visit: Payer: Medicare Other | Admitting: Internal Medicine

## 2013-07-19 ENCOUNTER — Encounter: Payer: Self-pay | Admitting: Internal Medicine

## 2013-07-19 ENCOUNTER — Ambulatory Visit (INDEPENDENT_AMBULATORY_CARE_PROVIDER_SITE_OTHER): Payer: Medicare Other | Admitting: Family Medicine

## 2013-07-19 ENCOUNTER — Ambulatory Visit (INDEPENDENT_AMBULATORY_CARE_PROVIDER_SITE_OTHER): Payer: Medicare Other | Admitting: Internal Medicine

## 2013-07-19 VITALS — BP 124/80 | HR 90 | Temp 97.8°F | Ht 65.5 in | Wt 196.0 lb

## 2013-07-19 VITALS — BP 138/68 | HR 83 | Temp 97.8°F | Resp 12 | Wt 195.8 lb

## 2013-07-19 DIAGNOSIS — R262 Difficulty in walking, not elsewhere classified: Secondary | ICD-10-CM | POA: Diagnosis not present

## 2013-07-19 DIAGNOSIS — Z0000 Encounter for general adult medical examination without abnormal findings: Secondary | ICD-10-CM

## 2013-07-19 DIAGNOSIS — E785 Hyperlipidemia, unspecified: Secondary | ICD-10-CM | POA: Diagnosis not present

## 2013-07-19 DIAGNOSIS — J209 Acute bronchitis, unspecified: Secondary | ICD-10-CM | POA: Diagnosis not present

## 2013-07-19 DIAGNOSIS — E1149 Type 2 diabetes mellitus with other diabetic neurological complication: Secondary | ICD-10-CM

## 2013-07-19 DIAGNOSIS — J208 Acute bronchitis due to other specified organisms: Secondary | ICD-10-CM

## 2013-07-19 DIAGNOSIS — K219 Gastro-esophageal reflux disease without esophagitis: Secondary | ICD-10-CM

## 2013-07-19 DIAGNOSIS — L97509 Non-pressure chronic ulcer of other part of unspecified foot with unspecified severity: Secondary | ICD-10-CM | POA: Diagnosis not present

## 2013-07-19 DIAGNOSIS — J309 Allergic rhinitis, unspecified: Secondary | ICD-10-CM

## 2013-07-19 LAB — HEMOGLOBIN A1C: Hgb A1c MFr Bld: 7.3 % — ABNORMAL HIGH (ref 4.6–6.5)

## 2013-07-19 MED ORDER — GLUCOSE BLOOD VI STRP
ORAL_STRIP | Status: DC
Start: 2013-07-19 — End: 2013-11-05

## 2013-07-19 MED ORDER — INSULIN REGULAR HUMAN 100 UNIT/ML IJ SOLN: INTRAMUSCULAR | Status: DC

## 2013-07-19 MED ORDER — INSULIN NPH (HUMAN) (ISOPHANE) 100 UNIT/ML ~~LOC~~ SUSP: SUBCUTANEOUS | Status: DC

## 2013-07-19 MED ORDER — ALBUTEROL SULFATE HFA 108 (90 BASE) MCG/ACT IN AERS: 2.0000 | INHALATION_SPRAY | Freq: Four times a day (QID) | RESPIRATORY_TRACT | Status: DC | PRN

## 2013-07-19 MED ORDER — BECLOMETHASONE DIPROPIONATE 40 MCG/ACT IN AERS: 2.0000 | INHALATION_SPRAY | Freq: Two times a day (BID) | RESPIRATORY_TRACT | Status: DC

## 2013-07-19 NOTE — Patient Instructions (Addendum)
Please see a lawyer and/or go to this website to help you with advanced directives and designating a health care power of attorney so that your wishes will be followed should you become too ill to make your own medical decisions.  Proor.no  Start using the stationary bike -5 minutes daily for the first week, then increase by 5 minutes every week to a goal of 30 minutes at least 4 days per week  FOR the COUGH: -try the qvar twice daily for 2 weeks -albuterol as needed per instructions -follow up if persists or worsens, call and we may need to get an xray    Alcohol screening: done     Obesity Screening and counseling: done, working on cutting back on carbs and eating more veggies, not currently exercising - discussed starting stationary bike     STI screening: declined     Tobacco Screening: done       Pneumococcal (PPSV23 -one dose after 64, one before if risk factors), influenza yearly and hepatitis B vaccines (if high risk - end stage renal disease, IV drugs, homosexual men, live in home for mentally retarded, hemophilia receiving factors) ASSESSMENT/PLAN: done      Screening mammograph (yearly if >40) ASSESSMENT/PLAN: 02/2013      Screening Pap smear/pelvic exam (q2 years) ASSESSMENT/PLAN: s/p hysterectomy, goes to gyn      Prostate cancer screening ASSESSMENT/PLAN: n/a      Colorectal cancer screening (FOBT yearly or flex sig q4y or colonoscopy q10y or barium enema q4y) ASSESSMENT/PLAN: done 2014      Diabetes outpatient self-management training services ASSESSMENT/PLAN: doing this with endocrinologist      Bone mass measurements(covered q2y if indicated - estrogen def, osteoporosis, hyperparathyroid, vertebral abnormalities, osteoporosis or steroids) ASSESSMENT/PLAN: reports always normal, seeing endocrinologist      Screening for glaucoma(q1y if high risk - diabetes, FH, AA and > 43 or hispanic and > 14) ASSESSMENT/PLAN: sees optho yearly, no  glaucoma      Medical nutritional therapy for individuals with diabetes or renal disease ASSESSMENT/PLAN: done      Cardiovascular screening blood tests (lipids q5y) ASSESSMENT/PLAN:done      Diabetes screening tests ASSESSMENT/PLAN: done

## 2013-07-19 NOTE — Progress Notes (Signed)
Subjective:     Patient ID: Beth Holden, female   DOB: 1945-08-20, 68 y.o.   MRN: [id number]  HPI Beth Holden is a pleasant 68 y.o. 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 Last visit 1.5 mo ago.  Last HbA1C was: Lab Results  Component Value Date   HGBA1C 7.4* 04/15/2013   HGBA1C 7.2* 12/28/2012   HGBA1C 8.3* 08/08/2012   She was on the following meds, but she was in the doughnut hole >> we stopped in 04/2013: - Metformin 1000 mg bid - Levemir 47 units in am and 37 units in pm >> $800 for 3 mo supply - Januvia 100 mg daily - $450 for 3 mo - Invokana 100 mg - $455 for 3 mo We discussed about starting her on a VGo mechanical pump in the past. She had an appointment with diabetes education but decided not to pursue it.  She is now on: Metformin 1000 mg bid ReliOn N (long acting) and R (short acting) insulins as follows: Insulin Before breakfast Before lunch Before dinner  Regular  6 units with a small meal  8 units with a regular meal  10 units with a large meal x  6 units with a small meal  8 units with a regular meal  10 units with a large meal  NPH 20  x 20    She checks her sugars ~3x a day - reviewed log - sugars better, even has lows now (when skipped lunch): - am: 95 - 200 >> 86-139 >> 70s-100 (depends on dinner), occas. 170 >> 83-155 >> 71-197 >> 82-157 >> 96-170 (201) >> 83-140 (207)  - 2h after b'fast 138-160 >> after b'fast: 141 >> 145-177 >> 122-185 >> n/c >> 141-183 (248) >> 131-180 (235 x1) - prelunch: 80-124 >> 120-184 >> n/c >> 106, 157 >> 164 - 2-3h after lunch: 170-200 >> 125-215 >>70--174 >> 150-200 >> 92-175 >> 48 x1 (skipped lunch) 91-101 (218 - before dinner: 75-166 >> 94-151 >> n/c >> 116, 139 >> 57-136 - after dinner: <200 >> 196 >> 208-318 >> 180-225 >> 123, 141, 240 >> 78-134 - bedtime: 200s >> 159-232 >>  101-204 >> n/c >> 77, 193, 205 >> 75, 134, 230 Lowest: 48 - see above. Has hypoglycemia in the 70's.   Meals: - Breakfast: egg, bacon, toast; grits; biscuit; fruit; sometimes skips - Lunch: 1/2 PB sandwich or soup and yoghurt, sometimes grackers - Dinner: meat + vegetables + some starch - Snacks: fruit   - last eye exam in 07/17/2013 (dr Zadie Rhine): no DR, no glaucoma  - No CKD. She is on Enalapril. BUN  Date Value Ref Range Status  04/15/2013 17  6 - 23 mg/dL Final     Creatinine, Ser  Date Value Ref Range Status  04/15/2013 0.6  0.4 - 1.2 mg/dL Final  - Has numbness and tingling in feet - was on Gabapentin and Amitriptyline.  Review of Systems Constitutional:+ weight gain, no fatigue, + poor sleep Eyes: + blurry vision, no xerophthalmia ENT: no sore throat, no nodules palpated in throat, no dysphagia/odynophagia, no hoarseness Cardiovascular: no CP/SOB/palpitations/leg swelling Respiratory: + cough/+ SOB Gastrointestinal: no N/V/D/C Musculoskeletal: no muscle/joint aches Skin: no rashes  I reviewed pt's medications, allergies, PMH, social hx, family hx and no changes required, except as above.  Objective:   Physical Exam BP 138/68  Pulse 83  Temp(Src) 97.8 F (36.6  C) (Oral)  Resp 12  Wt 195 lb 12.8 oz (88.814 kg)  SpO2 97% Wt Readings from Last 3 Encounters:  07/19/13 195 lb 12.8 oz (88.814 kg)  07/19/13 196 lb (88.905 kg)  06/10/13 191 lb (86.637 kg)   Constitutional: overweight, in NAD Eyes: PERRLA, EOMI, no exophthalmos ENT: moist mucous membranes, no thyromegaly, no cervical lymphadenopathy Cardiovascular: RRR, No MRG Respiratory: CTA B Gastrointestinal: abdomen soft, NT, ND, BS+ Musculoskeletal: strength intact in all 4; missing right toes: 2,3,4 Skin: moist, warm, no rashes  Assessment:     1. DM2, uncontrolled, insulin-dependent, with complications - diabetic peripheral neuropathy - 3 toe amputations - after infected diabetic toe ulcers >> OM:  R 2nd  toe amputated on 12/28/2011  R 3rd toe amputated on 04/25/2012  R 4th toe amputated on 07/27/2012    Plan:     1. DM2 Patient with long-standing diabetes, with improved sugars - even to the point of lows if skips meals >> will reduce the long acting insulin doses.  - I advised her to:  Patient Instructions  Please change the ReliOn N (long acting) and R (short acting) insulins as follows: Insulin Before breakfast Before lunch Before dinner  Regular  6 units with a small meal  8 units with a regular meal  10 units with a large meal x  6 units with a small meal  8 units with a regular meal  10 units with a large meal  NPH 17 x 17   Please return in 1.5 month with your sugar log.  - We do not need lunchtime R insulin for now - check Hba1c today  - return to clinic in 3 months with her sugar log  Office Visit on 07/19/2013  Component Date Value Ref Range Status  . Hemoglobin A1C 07/19/2013 7.3* 4.6 - 6.5 % Final   Glycemic Control Guidelines for People with Diabetes:Non Diabetic:  <6%Goal of Therapy: <7%Additional Action Suggested:  >8%    HbA1c is stable.

## 2013-07-19 NOTE — Progress Notes (Signed)
Medicare Annual Preventive Care Visit  (initial annual wellness or annual wellness exam)  Concerns and/or follow up today:  Cough: -started a few weeks ago -symptoms: started out as a cold (nasal congestion, pnd) now with persistent cough, diarrhea this week but granddaughter with this -denies: a little wheezing with coughing spells but no SOB ortherwise, fevers, NV -hx of allergy issues, on inhalers in the past, she is taking zyrtec  HLD: -changed to lovastatin 04/2013 due to cost -stable  HTN: -on enalopril -stable  Obesity: -no exercise currently - has stationary bike -diet is so so  DM: -managed by her endocrinologist - on insulin, with neurological complications -denies: polyuria, polydipsia, vision changes, lesions on feet -takes neurontin and elavil for the peripheral neuropathy  ROS: negative for report of fevers, unintentional weight loss, vision changes, vision loss, hearing loss or change, chest pain, sob, hemoptysis, melena, hematochezia, hematuria, genital discharge or lesions, falls, bleeding or bruising, loc, thoughts of suicide or self harm, memory loss  1.) Patient-completed health risk assessment  - completed and reviewed, see scanned documentation  2.) Review of Medical History: -PMH, PSH, Family History and current specialty and care providers reviewed and updated and listed below  - see scanned in document in chart and below  Past Medical History  Diagnosis Date  . Erosive esophagitis   . Peripheral neuropathy   . IBS (irritable bowel syndrome)   . Hyperlipemia     takes Crestor daily  . ALOPECIA NEC   . Seasonal allergies     takes Zyrtec daily  . Headache(784.0)     occasionally;r/t sinus   . Joint pain   . History of colon polyps   . Diabetes mellitus type II     takes Metformin and Flexpen daily  . Hypertension     takes Enalapril daily  . GERD (gastroesophageal reflux disease)     takes Nexium bid  . Osteomyelitis   . Insomnia      takes Elavil nightly and as well as neuropathy  . Peripheral neuropathy   . Diverticulosis   . ALLERGIC RHINITIS     takes Zyrtec daily    Past Surgical History  Procedure Laterality Date  . Vaginal hysterectomy  2001  . Oophorectomy  2001  . Tubal ligation    . Rotator cuff repair Right   . Laparoscopic appendectomy  01/05/2011    Procedure: APPENDECTOMY LAPAROSCOPIC;  Surgeon: Pedro Earls, MD;  Location: [facility];  Service: General;  Laterality: N/A;  . Appendectomy  2012  . Colonoscopy    . Toe amputation  12/28/2011    rt second toe  . Amputation  12/28/2011    Procedure: AMPUTATION DIGIT;  Surgeon: Newt Minion, MD;  Location: [place];  Service: Orthopedics;  Laterality: Right;  Right Foot 2nd Toe Amputation at MTP (metatarsophalangeal joint)  . Amputation Right 04/25/2012    Procedure: Right Foot 3rd Toe Amputation;  Surgeon: Newt Minion, MD;  Location: [place];  Service: Orthopedics;  Laterality: Right;  Right Foot Third Toe Amputation   . Amputation Right 07/27/2012    Procedure: Right 4th Toe Amputation at Metatarsophalangeal;  Surgeon: Newt Minion, MD;  Location: [place];  Service: Orthopedics;  Laterality: Right;  Right 4th Toe Amputation at Metatarsophalangeal    History   Social History  . Marital Status: Married    Spouse Name: N/A    Number of Children: 2  . Years of Education: N/A   Occupational History  .  Retired     Social History Main Topics  . Smoking status: Never Smoker   . Smokeless tobacco: Never Used  . Alcohol Use: No  . Drug Use: No  . Sexual Activity: Yes    Birth Control/ Protection: Surgical   Other Topics Concern  . Not on file   Social History Narrative   Caffeine daily    HSG, [employer] no diploma   Married '66   1 dtr- '78; 1 son '71; 2 grandchildren   Occupation: retired 04   Dad with alzheimers-had to place in [place] (summer '10)          The patient has a family history of  3.) Review of functional ability and level of  safety:  Any difficulty hearing? NO  History of falling? NO  Any trouble with IADLs - using a phone, using transportation, grocery shopping, preparing meals, doing housework, doing laundry, taking medications and managing money? NO  Advance Directives? NO  See summary of recommendations in Patient Instructions below.  4.) Physical Exam Filed Vitals:   07/19/13 0813  BP: 124/80  Pulse: 90  Temp: 97.8 F (36.6 C)   Estimated body mass index is 32.11 kg/(m^2) as calculated from the following:   Height as of this encounter: 5' 5.5" (1.664 m).   Weight as of this encounter: 196 lb (88.905 kg).  General: alert, appear well hydrated and in no acute distress  HEENT: visual acuity grossly intact  CV: HRRR  Lungs: CTA bilaterally  Psych: pleasant and cooperative, no obvious depression or anxiety  Mini Cog: 1. Patient instructed to listen carefully and repeat the following: [place]  2. Clock drawing test was administered: NORMAL     3. Recall of three words 3/3  Scoring:  Patient Score: NEG    See patient instructions for recommendations.  4)The following written screening schedule of preventive measures were reviewed with assessment and plan made per below, orders and patient instructions:           Alcohol screening: done     Obesity Screening and counseling: done, working on cutting back on carbs and eating more veggies, not currently exercising - discussed starting stationary bike     STI screening: declined     Tobacco Screening: done       Pneumococcal (PPSV23 -one dose after 64, one before if risk factors), influenza yearly and hepatitis B vaccines (if high risk - end stage renal disease, IV drugs, homosexual men, live in home for mentally retarded, hemophilia receiving factors) ASSESSMENT/PLAN: done      Screening mammograph (yearly if >40) ASSESSMENT/PLAN: 02/2013      Screening Pap smear/pelvic exam (q2 years) ASSESSMENT/PLAN: s/p  hysterectomy, goes to gyn      Prostate cancer screening ASSESSMENT/PLAN: n/a      Colorectal cancer screening (FOBT yearly or flex sig q4y or colonoscopy q10y or barium enema q4y) ASSESSMENT/PLAN: done 2014      Diabetes outpatient self-management training services ASSESSMENT/PLAN: doing this with endocrinologist      Bone mass measurements(covered q2y if indicated - estrogen def, osteoporosis, hyperparathyroid, vertebral abnormalities, osteoporosis or steroids) ASSESSMENT/PLAN: reports always normal, seeing endocrinologist      Screening for glaucoma(q1y if high risk - diabetes, FH, AA and > 72 or hispanic and > 63) ASSESSMENT/PLAN: sees optho yearly, no glaucoma      Medical nutritional therapy for individuals with diabetes or renal disease ASSESSMENT/PLAN: done      Cardiovascular screening blood  tests (lipids q5y) ASSESSMENT/PLAN:done      Diabetes screening tests ASSESSMENT/PLAN: done   7.) Summary: -risk factors and conditions per above assessment were discussed and treatment, recommendations and referrals were offered per documentation above and orders and patient instructions.  Medicare annual wellness visit, initial  Acute bronchitis due to other specified organisms - Plan: beclomethasone (QVAR) 40 MCG/ACT inhaler, albuterol (PROAIR HFA) 108 (90 BASE) MCG/ACT inhaler  Type II diabetes with neurological manifestations, uncontrolled(250.62) - followed by endocrine, Dr. Jacelyn Pi  DIABETIC PERIPHERAL NEUROPATHY  ALLERGIC RHINITIS  Gastroesophageal reflux disease, esophagitis presence not specified

## 2013-07-19 NOTE — Progress Notes (Signed)
Pre visit review using our clinic review tool, if applicable. No additional management support is needed unless otherwise documented below in the visit note. 

## 2013-07-19 NOTE — Patient Instructions (Signed)
Please decrease the N insulin doses as follows:  Insulin Before breakfast Before lunch Before dinner  Regular  6 units with a small meal  8 units with a regular meal  10 units with a large meal x  6 units with a small meal  8 units with a regular meal  10 units with a large meal  NPH 17 x 17   Please return in 3 months with your sugar log.  Please stop at the lab.

## 2013-08-02 DIAGNOSIS — R310 Gross hematuria: Secondary | ICD-10-CM | POA: Diagnosis not present

## 2013-08-02 DIAGNOSIS — N3946 Mixed incontinence: Secondary | ICD-10-CM | POA: Diagnosis not present

## 2013-08-02 DIAGNOSIS — R3915 Urgency of urination: Secondary | ICD-10-CM | POA: Diagnosis not present

## 2013-08-05 ENCOUNTER — Encounter: Payer: Self-pay | Admitting: *Deleted

## 2013-08-16 DIAGNOSIS — L97509 Non-pressure chronic ulcer of other part of unspecified foot with unspecified severity: Secondary | ICD-10-CM | POA: Diagnosis not present

## 2013-08-16 DIAGNOSIS — E1149 Type 2 diabetes mellitus with other diabetic neurological complication: Secondary | ICD-10-CM | POA: Diagnosis not present

## 2013-09-04 DIAGNOSIS — E1149 Type 2 diabetes mellitus with other diabetic neurological complication: Secondary | ICD-10-CM | POA: Diagnosis not present

## 2013-09-04 DIAGNOSIS — L97509 Non-pressure chronic ulcer of other part of unspecified foot with unspecified severity: Secondary | ICD-10-CM | POA: Diagnosis not present

## 2013-09-13 DIAGNOSIS — N3000 Acute cystitis without hematuria: Secondary | ICD-10-CM | POA: Diagnosis not present

## 2013-09-13 DIAGNOSIS — R310 Gross hematuria: Secondary | ICD-10-CM | POA: Diagnosis not present

## 2013-09-18 ENCOUNTER — Encounter: Payer: Self-pay | Admitting: Family Medicine

## 2013-10-07 DIAGNOSIS — L97509 Non-pressure chronic ulcer of other part of unspecified foot with unspecified severity: Secondary | ICD-10-CM | POA: Diagnosis not present

## 2013-10-07 DIAGNOSIS — B351 Tinea unguium: Secondary | ICD-10-CM | POA: Diagnosis not present

## 2013-10-07 DIAGNOSIS — E1149 Type 2 diabetes mellitus with other diabetic neurological complication: Secondary | ICD-10-CM | POA: Diagnosis not present

## 2013-10-11 ENCOUNTER — Ambulatory Visit: Payer: Medicare Other | Admitting: Family Medicine

## 2013-10-18 ENCOUNTER — Ambulatory Visit: Payer: Medicare Other | Admitting: Internal Medicine

## 2013-10-21 ENCOUNTER — Emergency Department (HOSPITAL_COMMUNITY): Payer: Medicare Other

## 2013-10-21 ENCOUNTER — Emergency Department (HOSPITAL_COMMUNITY)
Admission: EM | Admit: 2013-10-21 | Discharge: 2013-10-22 | Disposition: A | Discharge status: Home / Self Care | Payer: Medicare Other | Attending: Emergency Medicine | Admitting: Emergency Medicine

## 2013-10-21 ENCOUNTER — Encounter (HOSPITAL_COMMUNITY): Payer: Self-pay | Admitting: Emergency Medicine

## 2013-10-21 DIAGNOSIS — R05 Cough: Secondary | ICD-10-CM | POA: Insufficient documentation

## 2013-10-21 DIAGNOSIS — Z8739 Personal history of other diseases of the musculoskeletal system and connective tissue: Secondary | ICD-10-CM | POA: Insufficient documentation

## 2013-10-21 DIAGNOSIS — R1084 Generalized abdominal pain: Secondary | ICD-10-CM | POA: Diagnosis not present

## 2013-10-21 DIAGNOSIS — Z8709 Personal history of other diseases of the respiratory system: Secondary | ICD-10-CM | POA: Diagnosis not present

## 2013-10-21 DIAGNOSIS — K439 Ventral hernia without obstruction or gangrene: Secondary | ICD-10-CM | POA: Diagnosis not present

## 2013-10-21 DIAGNOSIS — R1010 Upper abdominal pain, unspecified: Secondary | ICD-10-CM | POA: Diagnosis present

## 2013-10-21 DIAGNOSIS — Z86010 Personal history of colonic polyps: Secondary | ICD-10-CM | POA: Diagnosis not present

## 2013-10-21 DIAGNOSIS — K219 Gastro-esophageal reflux disease without esophagitis: Secondary | ICD-10-CM | POA: Insufficient documentation

## 2013-10-21 DIAGNOSIS — Z872 Personal history of diseases of the skin and subcutaneous tissue: Secondary | ICD-10-CM | POA: Diagnosis not present

## 2013-10-21 DIAGNOSIS — I10 Essential (primary) hypertension: Secondary | ICD-10-CM | POA: Insufficient documentation

## 2013-10-21 DIAGNOSIS — E785 Hyperlipidemia, unspecified: Secondary | ICD-10-CM | POA: Diagnosis not present

## 2013-10-21 DIAGNOSIS — G629 Polyneuropathy, unspecified: Secondary | ICD-10-CM | POA: Insufficient documentation

## 2013-10-21 DIAGNOSIS — E119 Type 2 diabetes mellitus without complications: Secondary | ICD-10-CM | POA: Diagnosis not present

## 2013-10-21 DIAGNOSIS — Z79899 Other long term (current) drug therapy: Secondary | ICD-10-CM | POA: Insufficient documentation

## 2013-10-21 DIAGNOSIS — Z794 Long term (current) use of insulin: Secondary | ICD-10-CM | POA: Insufficient documentation

## 2013-10-21 DIAGNOSIS — R1013 Epigastric pain: Secondary | ICD-10-CM | POA: Diagnosis not present

## 2013-10-21 LAB — CBC WITH DIFFERENTIAL/PLATELET
Basophils Absolute: 0 10*3/uL (ref 0.0–0.1)
Basophils Relative: 0 % (ref 0–1)
Eosinophils Absolute: 0.1 10*3/uL (ref 0.0–0.7)
Eosinophils Relative: 1 % (ref 0–5)
HCT: 37.1 % (ref 36.0–46.0)
Hemoglobin: 12 g/dL (ref 12.0–15.0)
Lymphocytes Relative: 24 % (ref 12–46)
Lymphs Abs: 2.7 10*3/uL (ref 0.7–4.0)
MCH: 26.5 pg (ref 26.0–34.0)
MCHC: 32.3 g/dL (ref 30.0–36.0)
MCV: 81.9 fL (ref 78.0–100.0)
Monocytes Absolute: 0.5 10*3/uL (ref 0.1–1.0)
Monocytes Relative: 5 % (ref 3–12)
Neutro Abs: 8 10*3/uL — ABNORMAL HIGH (ref 1.7–7.7)
Neutrophils Relative %: 70 % (ref 43–77)
Platelets: 408 10*3/uL — ABNORMAL HIGH (ref 150–400)
RBC: 4.53 MIL/uL (ref 3.87–5.11)
RDW: 13.7 % (ref 11.5–15.5)
WBC: 11.4 10*3/uL — ABNORMAL HIGH (ref 4.0–10.5)

## 2013-10-21 LAB — COMPREHENSIVE METABOLIC PANEL
ALT: 16 U/L (ref 0–35)
AST: 19 U/L (ref 0–37)
Albumin: 3.9 g/dL (ref 3.5–5.2)
Alkaline Phosphatase: 61 U/L (ref 39–117)
Anion gap: 16 — ABNORMAL HIGH (ref 5–15)
BUN: 16 mg/dL (ref 6–23)
CO2: 22 mEq/L (ref 19–32)
Calcium: 9.5 mg/dL (ref 8.4–10.5)
Chloride: 99 mEq/L (ref 96–112)
Creatinine, Ser: 0.72 mg/dL (ref 0.50–1.10)
GFR calc Af Amer: >90 mL/min (ref >90)
GFR calc non Af Amer: 88 mL/min — ABNORMAL LOW (ref >90)
Glucose, Bld: 231 mg/dL — ABNORMAL HIGH (ref 70–99)
Potassium: 4.2 mEq/L (ref 3.7–5.3)
Sodium: 137 mEq/L (ref 137–147)
Total Bilirubin: 0.3 mg/dL (ref 0.3–1.2)
Total Protein: 8.1 g/dL (ref 6.0–8.3)

## 2013-10-21 LAB — I-STAT TROPONIN, ED: Troponin i, poc: 0 ng/mL (ref 0.00–0.08)

## 2013-10-21 LAB — CBG MONITORING, ED: Glucose-Capillary: 220 mg/dL — ABNORMAL HIGH (ref 70–99)

## 2013-10-21 LAB — LIPASE, BLOOD: Lipase: 16 U/L (ref 11–59)

## 2013-10-21 MED ORDER — FENTANYL CITRATE 0.05 MG/ML IJ SOLN
50.0000 ug | Freq: Once | INTRAMUSCULAR | Status: AC
  Administered 2013-10-21: 50 ug via INTRAVENOUS

## 2013-10-21 MED ORDER — HYDROMORPHONE HCL 1 MG/ML IJ SOLN
0.5000 mg | INTRAMUSCULAR | Status: DC | PRN
  Administered 2013-10-21: 0.5 mg via INTRAVENOUS
  Filled 2013-10-21: qty 1

## 2013-10-21 MED ORDER — HYDROCODONE-ACETAMINOPHEN 5-325 MG PO TABS: 1.0000 | ORAL_TABLET | ORAL | Status: DC | PRN

## 2013-10-21 MED ORDER — ONDANSETRON HCL 4 MG/2ML IJ SOLN
4.0000 mg | Freq: Once | INTRAMUSCULAR | Status: AC
  Administered 2013-10-21: 4 mg via INTRAVENOUS
  Filled 2013-10-21: qty 2

## 2013-10-21 MED ORDER — FENTANYL CITRATE 0.05 MG/ML IJ SOLN
INTRAMUSCULAR | Status: AC
  Administered 2013-10-21: 22:00:00
  Filled 2013-10-21: qty 2

## 2013-10-21 NOTE — ED Notes (Signed)
Presents with epigastric pain began yesterday is intermittent, described as sharp and getting morte severe. Standing and movement make pain worse. Denies SOB, nausea, vomiting, diarrhea. Pt is visibly uncomfortable, pain rated 8/10

## 2013-10-21 NOTE — ED Provider Notes (Signed)
CSN: [id number]     Arrival date & time 10/21/13  1924 History   First MD Initiated Contact with Patient 10/21/13 2146     Chief Complaint  Patient presents with  . Abdominal Pain      HPI  Impression evaluation of upper abdominal pain. She's had a cough for last 2 weeks. She has pain in her abdomen when she coughs. She was sitting in [place] over the last 24 hours she has some pain in her upper epigastrium, worse with cough.  Does note protrusion of upper abdomen. No vomiting.  No chest pain.  Past Medical History  Diagnosis Date  . Erosive esophagitis   . Peripheral neuropathy   . IBS (irritable bowel syndrome)   . Hyperlipemia     takes Crestor daily  . ALOPECIA NEC   . Seasonal allergies     takes Zyrtec daily  . Headache(784.0)     occasionally;r/t sinus   . Joint pain   . History of colon polyps   . Diabetes mellitus type II     takes Metformin and Flexpen daily  . Hypertension     takes Enalapril daily  . GERD (gastroesophageal reflux disease)     takes Nexium bid  . Osteomyelitis   . Insomnia     takes Elavil nightly and as well as neuropathy  . Peripheral neuropathy   . Diverticulosis   . ALLERGIC RHINITIS     takes Zyrtec daily   Past Surgical History  Procedure Laterality Date  . Vaginal hysterectomy  2001  . Oophorectomy  2001  . Tubal ligation    . Rotator cuff repair Right   . Laparoscopic appendectomy  01/05/2011    Procedure: APPENDECTOMY LAPAROSCOPIC;  Surgeon: Pedro Earls, MD;  Location: [facility];  Service: General;  Laterality: N/A;  . Appendectomy  2012  . Colonoscopy    . Toe amputation  12/28/2011    rt second toe  . Amputation  12/28/2011    Procedure: AMPUTATION DIGIT;  Surgeon: Newt Minion, MD;  Location: [facility];  Service: Orthopedics;  Laterality: Right;  Right Foot 2nd Toe Amputation at MTP (metatarsophalangeal joint)  . Amputation Right 04/25/2012    Procedure: Right Foot 3rd Toe Amputation;  Surgeon:  Newt Minion, MD;  Location: [place];  Service: Orthopedics;  Laterality: Right;  Right Foot Third Toe Amputation   . Amputation Right 07/27/2012    Procedure: Right 4th Toe Amputation at Metatarsophalangeal;  Surgeon: Newt Minion, MD;  Location: [place];  Service: Orthopedics;  Laterality: Right;  Right 4th Toe Amputation at Metatarsophalangeal   Family History  Problem Relation Age of Onset  . Lung cancer Mother 73  . Cancer Mother     lung  . Hypertension Father   . Hyperlipidemia Father   . Diabetes Father   . Coronary artery disease Father   . Dementia Father   . Stomach cancer Paternal Aunt   . Cancer Paternal Aunt     stomach  . Brain cancer Paternal Uncle   . Cancer Paternal Uncle     brain  . Colon cancer Neg Hx   . Irritable bowel syndrome      Several family members on fathers side   . Diabetes Other   . Cancer Maternal Aunt     stomach   History  Substance Use Topics  . Smoking status: Never Smoker   . Smokeless tobacco: Never Used  .  Alcohol Use: No   OB History   Grav Para Term Preterm Abortions TAB SAB Ect Mult Living                 Review of Systems  Constitutional: Negative for fever, chills, diaphoresis, appetite change and fatigue.  HENT: Negative for mouth sores, sore throat and trouble swallowing.   Eyes: Negative for visual disturbance.  Respiratory: Negative for cough, chest tightness, shortness of breath and wheezing.   Cardiovascular: Negative for chest pain.  Gastrointestinal: Positive for abdominal pain and abdominal distention. Negative for nausea, vomiting and diarrhea.  Endocrine: Negative for polydipsia, polyphagia and polyuria.  Genitourinary: Negative for dysuria, frequency and hematuria.  Musculoskeletal: Negative for gait problem.  Skin: Negative for color change, pallor and rash.  Neurological: Negative for dizziness, syncope, light-headedness and headaches.  Hematological: Does not bruise/bleed easily.  Psychiatric/Behavioral:  Negative for behavioral problems and confusion.      Allergies  Codeine and Propoxyphene hcl  Home Medications   Prior to Admission medications   Medication Sig Start Date End Date Taking? Authorizing Provider  acetaminophen (TYLENOL) 500 MG tablet Take 500 mg by mouth every 6 (six) hours as needed for mild pain.    Yes Historical Provider, MD  amitriptyline (ELAVIL) 25 MG tablet Take 25 mg by mouth at bedtime.   Yes Historical Provider, MD  cetirizine (ZYRTEC) 10 MG tablet Take 10 mg by mouth daily.    Yes Historical Provider, MD  enalapril (VASOTEC) 20 MG tablet Take 1 tablet (20 mg total) by mouth daily. 06/13/13  Yes Lucretia Kern, DO  esomeprazole (NEXIUM) 40 MG capsule Take 1 capsule (40 mg total) by mouth 2 (two) times daily. 12/10/12  Yes Ladene Artist, MD  Fluocinolone Acetonide (DERMOTIC) 0.01 % OIL Place 1 drop in ear(s) daily as needed (itching).    Yes Historical Provider, MD  gabapentin (NEURONTIN) 300 MG capsule Take 300 mg by mouth at bedtime.   Yes Historical Provider, MD  insulin NPH Human (HUMULIN N,NOVOLIN N) 100 UNIT/ML injection Inject 17-20 Units into the skin 2 (two) times daily before a meal.   Yes Historical Provider, MD  insulin regular (NOVOLIN R,HUMULIN R) 100 units/mL injection Inject 6-10 Units into the skin 3 (three) times daily before meals. Sliding scale   Yes Historical Provider, MD  lovastatin (MEVACOR) 20 MG tablet Take 1 tablet (20 mg total) by mouth at bedtime. 04/15/13  Yes Lucretia Kern, DO  metFORMIN (GLUCOPHAGE) 1000 MG tablet Take 1 tablet (1,000 mg total) by mouth 2 (two) times daily with a meal. 09/21/12  Yes Philemon Kingdom, MD  Multiple Vitamin (MULTIVITAMIN WITH MINERALS) TABS tablet Take 1 tablet by mouth daily.   Yes Historical Provider, MD  naproxen sodium (ANAPROX) 220 MG tablet Take 220 mg by mouth 2 (two) times daily as needed (for pain).   Yes Historical Provider, MD  glucose blood (ONE TOUCH ULTRA TEST) test strip Use to test blood sugar 3  times daily as instructed. Dx code: 250.62 07/19/13   Philemon Kingdom, MD  HYDROcodone-acetaminophen (NORCO/VICODIN) 5-325 MG per tablet Take 1 tablet by mouth every 4 (four) hours as needed. 10/21/13   Tanna Furry, MD  Insulin Syringe-Needle U-100 31G X 5/16" 1 ML MISC Use 2x a day 05/03/13   Philemon Kingdom, MD   BP 115/59  Pulse 99  Temp(Src) 98.1 F (36.7 C) (Oral)  Resp 20  Ht 5' 5.5" (1.664 m)  Wt 201 lb 3 oz (91.258 kg)  BMI 32.96 kg/m2  SpO2 97% Physical Exam  Constitutional: She is oriented to person, place, and time. She appears well-developed and well-nourished. No distress.  HENT:  Head: Normocephalic.  Eyes: Conjunctivae are normal. Pupils are equal, round, and reactive to light. No scleral icterus.  Neck: Normal range of motion. Neck supple. No thyromegaly present.  Cardiovascular: Normal rate and regular rhythm.  Exam reveals no gallop and no friction rub.   No murmur heard. Pulmonary/Chest: Effort normal and breath sounds normal. No respiratory distress. She has no wheezes. She has no rales.  Abdominal: Soft. Bowel sounds are normal. She exhibits no distension. There is no tenderness. There is no rebound.    Musculoskeletal: Normal range of motion.  Neurological: She is alert and oriented to person, place, and time.  Skin: Skin is warm and dry. No rash noted.  Psychiatric: She has a normal mood and affect. Her behavior is normal.    ED Course  Procedures (including critical care time) Labs Review Labs Reviewed  COMPREHENSIVE METABOLIC PANEL - Abnormal; Notable for the following:    Glucose, Bld 231 (*)    GFR calc non Af Amer 88 (*)    Anion gap 16 (*)    All other components within normal limits  CBC WITH DIFFERENTIAL - Abnormal; Notable for the following:    WBC 11.4 (*)    Platelets 408 (*)    Neutro Abs 8.0 (*)    All other components within normal limits  CBG MONITORING, ED - Abnormal; Notable for the following:    Glucose-Capillary 220 (*)    All  other components within normal limits  LIPASE, BLOOD  I-STAT TROPOININ, ED  CBG MONITORING, ED    Imaging Review Ct Abdomen Pelvis Wo Contrast  10/21/2013   CLINICAL DATA:  Epigastric pain which began 24 hr prior. Sharp pain.  EXAM: CT ABDOMEN AND PELVIS WITHOUT CONTRAST  TECHNIQUE: Multidetector CT imaging of the abdomen and pelvis was performed following the standard protocol without IV contrast.  COMPARISON:  CT 01/05/2011  FINDINGS: Lower chest:  Mild left basilar atelectasis.  No pericardial fluid.  Hepatobiliary: No focal hepatic lesions non contrast exam. Mild fatty infiltration along the falciform ligament. Gallbladder is normal.  Pancreas: Pancreas is normal. No duct dilatation. No pancreatic inflammation.  Spleen: Normal spleen  Adrenals/urinary tract: Adrenal glands kidneys are normal. No ureteral obstruction. Normal bladder.  Stomach/Bowel: Stomach, small bowel, cecum are normal. Colon rectosigmoid colon are normal.  Vascular/Lymphatic: Abdominal aorta is normal caliber. There is no retroperitoneal or periportal lymphadenopathy. No pelvic lymphadenopathy.  Reproductive: Post hysterectomy.  Musculoskeletal: Chronic anterolisthesis of L5 on S1 unchanged. Bilateral pars defects at this level.  Other: No free fluid the abdomen pelvis. No ventral or inguinal hernia.  IMPRESSION: 1. No evidence of hernia. 2. No acute findings in the abdomen or pelvis. 3. 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   Electronically Signed   By: Suzy Bouchard M.D.   On: 10/21/2013 22:49   Dg Chest 2 View  10/21/2013   CLINICAL DATA:  Epigastric pain for 1 day.  EXAM: CHEST  2 VIEW  COMPARISON:  PA and lateral chest 04/24/2012.  FINDINGS: Lung volumes are low but the lungs are clear. Heart size is normal. No pneumothorax or pleural effusion. Status post rotator cuff repair on the right.  IMPRESSION: No acute disease.   Electronically Signed   By: Inge Rise M.D.   On: 10/21/2013  21:38  EKG Interpretation None      MDM   Final diagnoses:  Generalized abdominal pain  Ventral hernia without obstruction or gangrene    CT shows no current. No protrusion intra-abdominal contents or signs of strangulation. On exam she is completely symptomatic line supine. Her ventral hernia protrudes sitting upright. No change of position she has pain at the subxiphoid area in the upper abdomen the edges are defect. Next is appropriate for outpatient care. Surgical followup. Avoid changing positions, headache did become heavy lifting.    Tanna Furry, MD 10/21/13 [phone]

## 2013-10-21 NOTE — ED Notes (Signed)
Pt back from CT

## 2013-10-21 NOTE — Discharge Instructions (Signed)
Rest. Avoid activity, position changes, or heavy lifting. Call [facility] surgery for re-evaluation. Return here with fever, worsening pain, or any changing symptoms.  Abdominal Pain Many things can cause belly (abdominal) pain. Most times, the belly pain is not dangerous. Many cases of belly pain can be watched and treated at home. HOME CARE   Do not take medicines that help you go poop (laxatives) unless told to by your doctor.  Only take medicine as told by your doctor.  Eat or drink as told by your doctor. Your doctor will tell you if you should be on a special diet. GET HELP IF:  You do not know what is causing your belly pain.  You have belly pain while you are sick to your stomach (nauseous) or have runny poop (diarrhea).  You have pain while you pee or poop.  Your belly pain wakes you up at night.  You have belly pain that gets worse or better when you eat.  You have belly pain that gets worse when you eat fatty foods.  You have a fever. GET HELP RIGHT AWAY IF:   The pain does not go away within 2 hours.  You keep throwing up (vomiting).  The pain changes and is only in the right or left part of the belly.  You have bloody or tarry looking poop. MAKE SURE YOU:   Understand these instructions.  Will watch your condition.  Will get help right away if you are not doing well or get worse. Document Released: 06/15/2007 Document Revised: 01/01/2013 Document Reviewed: 09/05/2012 [facility] Patient Information 2015 [place], [place]. This information is not intended to replace advice given to you by your health care provider. Make sure you discuss any questions you have with your health care provider.  Ventral Hernia A ventral hernia (also called an incisional hernia) is a hernia that occurs at the site of a previous surgical cut (incision) in the abdomen. The abdominal wall spans from your lower chest down to your pelvis. If the abdominal wall is weakened from a  surgical incision, a hernia can occur. A hernia is a bulge of bowel or muscle tissue pushing out on the weakened part of the abdominal wall. Ventral hernias can get bigger from straining or lifting. Obese and older people are at higher risk for a ventral hernia. People who develop infections after surgery or require repeat incisions at the same site on the abdomen are also at increased risk. CAUSES  A ventral hernia occurs because of weakness in the abdominal wall at an incision site.  SYMPTOMS  Common symptoms include:  A visible bulge or lump on the abdominal wall.  Pain or tenderness around the lump.  Increased discomfort if you cough or make a sudden movement. If the hernia has blocked part of the intestine, a serious complication can occur (incarcerated or strangulated hernia). This can become a problem that requires emergency surgery because the blood flow to the blocked intestine may be cut off. Symptoms may include:  Feeling sick to your stomach (nauseous).  Throwing up (vomiting).  Stomach swelling (distention) or bloating.  Fever.  Rapid heartbeat. DIAGNOSIS  Your health care provider will take a medical history and perform a physical exam. Various tests may be ordered, such as:  Blood tests.  Urine tests.  Ultrasonography.  X-rays.  Computed tomography (CT). TREATMENT  Watchful waiting may be all that is needed for a smaller hernia that does not cause symptoms. Your health care provider may recommend the  use of a supportive belt (truss) that helps to keep the abdominal wall intact. For larger hernias or those that cause pain, surgery to repair the hernia is usually recommended. If a hernia becomes strangulated, emergency surgery needs to be done right away. HOME CARE INSTRUCTIONS  Avoid putting pressure or strain on the abdominal area.  Avoid heavy lifting.  Use good body positioning for physical tasks. Ask your health care provider about proper body  positioning.  Use a supportive belt as directed by your health care provider.  Maintain a healthy weight.  Eat foods that are high in fiber, such as whole grains, fruits, and vegetables. Fiber helps prevent difficult bowel movements (constipation).  Drink enough fluids to keep your urine clear or pale yellow.  Follow up with your health care provider as directed. SEEK MEDICAL CARE IF:   Your hernia seems to be getting larger or more painful. SEEK IMMEDIATE MEDICAL CARE IF:   You have abdominal pain that is sudden and sharp.  Your pain becomes severe.  You have repeated vomiting.  You are sweating a lot.  You notice a rapid heartbeat.  You develop a fever. MAKE SURE YOU:   Understand these instructions.  Will watch your condition.  Will get help right away if you are not doing well or get worse. Document Released: 12/14/2011 Document Revised: 05/13/2013 Document Reviewed: 12/14/2011 [facility] Patient Information 2015 [place], [place]. This information is not intended to replace advice given to you by your health care provider. Make sure you discuss any questions you have with your health care provider.

## 2013-10-21 NOTE — ED Notes (Signed)
Patient transported to CT 

## 2013-10-22 DIAGNOSIS — K439 Ventral hernia without obstruction or gangrene: Secondary | ICD-10-CM | POA: Diagnosis not present

## 2013-10-23 ENCOUNTER — Encounter: Payer: Self-pay | Admitting: Family Medicine

## 2013-10-23 DIAGNOSIS — M6208 Separation of muscle (nontraumatic), other site: Secondary | ICD-10-CM | POA: Diagnosis not present

## 2013-10-23 DIAGNOSIS — S39011A Strain of muscle, fascia and tendon of abdomen, initial encounter: Secondary | ICD-10-CM | POA: Diagnosis not present

## 2013-10-24 ENCOUNTER — Telehealth: Payer: Self-pay | Admitting: Gastroenterology

## 2013-10-24 MED ORDER — OMEPRAZOLE 40 MG PO CPDR: 40.0000 mg | DELAYED_RELEASE_CAPSULE | Freq: Two times a day (BID) | ORAL | Status: DC

## 2013-10-24 MED ORDER — OMEPRAZOLE 20 MG PO CPDR: 40.0000 mg | DELAYED_RELEASE_CAPSULE | Freq: Two times a day (BID) | ORAL | Status: DC

## 2013-10-24 NOTE — Telephone Encounter (Signed)
Patient's insurance still will not approve omeprazole 40 mg. They will, however, cover omeprazole 20 mg with no restrictions. Therefore, I will send a new rx for omeprazole 20 mg -Take 2 tablets twice daily #360. Patient has been advised of this.

## 2013-10-24 NOTE — Telephone Encounter (Signed)
Dr Fuller Plan what else can we send?

## 2013-10-24 NOTE — Telephone Encounter (Signed)
Pt aware and med was sent to the pharmacy, she will call with any concerns

## 2013-10-24 NOTE — Addendum Note (Signed)
Addended by: Larina Bras on: 10/24/2013 01:48 PM   Modules accepted: Orders, Medications

## 2013-10-24 NOTE — Telephone Encounter (Signed)
Omeprazole 40 mg po bid should be less expensive but it might not work as well as Nexium

## 2013-10-25 ENCOUNTER — Ambulatory Visit: Payer: Medicare Other | Admitting: Internal Medicine

## 2013-10-25 ENCOUNTER — Ambulatory Visit (INDEPENDENT_AMBULATORY_CARE_PROVIDER_SITE_OTHER): Payer: Medicare Other | Admitting: Family Medicine

## 2013-10-25 ENCOUNTER — Ambulatory Visit: Payer: Medicare Other | Admitting: Family Medicine

## 2013-10-25 ENCOUNTER — Encounter: Payer: Self-pay | Admitting: Family Medicine

## 2013-10-25 VITALS — BP 130/84 | Temp 98.3°F | Wt 202.0 lb

## 2013-10-25 DIAGNOSIS — I10 Essential (primary) hypertension: Secondary | ICD-10-CM | POA: Diagnosis not present

## 2013-10-25 DIAGNOSIS — R05 Cough: Secondary | ICD-10-CM | POA: Diagnosis not present

## 2013-10-25 DIAGNOSIS — R059 Cough, unspecified: Secondary | ICD-10-CM

## 2013-10-25 DIAGNOSIS — R1013 Epigastric pain: Secondary | ICD-10-CM

## 2013-10-25 DIAGNOSIS — J309 Allergic rhinitis, unspecified: Secondary | ICD-10-CM | POA: Diagnosis not present

## 2013-10-25 MED ORDER — LOSARTAN POTASSIUM 25 MG PO TABS: 25.0000 mg | ORAL_TABLET | Freq: Every day | ORAL | Status: DC

## 2013-10-25 MED ORDER — FLUTICASONE PROPIONATE 50 MCG/ACT NA SUSP: 2.0000 | Freq: Every day | NASAL | Status: DC

## 2013-10-25 NOTE — Patient Instructions (Addendum)
-  add flonase 2 sprays each nostril every day  -stop the enalopril and start losartan instead  -schedule follow up with Dr Fuller Plan about the abdominal pain  -we sent a referral to Dr. Lenna Gilford about the cough

## 2013-10-25 NOTE — Progress Notes (Signed)
Pre visit review using our clinic review tool, if applicable. No additional management support is needed unless otherwise documented below in the visit note. 

## 2013-10-25 NOTE — Progress Notes (Signed)
Chief Complaint  Patient presents with  . Follow-up  . Abdominal Pain    pulled muscle with coughing  - went to ER    HPI:  Allergic Rhinitis/? Mild asthma: -meds zyrtec daily, reported hx of inhaler use -still coughing - for several months, chronic productive cough and PND ? Pulled muscle -used to go to asthma and allergy -denies: fevers, sinus pain, SOB  HLD:  -changed to lovastatin 04/2013 due to cost  -stable  -denies: cog changes, leg cramps  HTN:  -on enalopril  -stable  -denies: CP, SOB, DOE, swelling, HA  Obesity:  -no exercise currently - has stationary bike  -diet is so so   GERD: -sees Dr. Fuller Plan -on PPI -denies: dysphagia, frequent heartburn -seen in ED 10/21/13 for abd pain with CT scan and then by surgeon for ? Ventral hernia - thought instead to be strain from coughing  Hx hematuria: -followed by urologist with neg work up  DM:  -managed by her endocrinologist - on insulin, with neurological complications  -denies: polyuria, polydipsia, vision changes, lesions on feet  -takes neurontin and elavil for the peripheral neuropathy    ROS: See pertinent positives and negatives per HPI.  Past Medical History  Diagnosis Date  . Erosive esophagitis   . Peripheral neuropathy   . IBS (irritable bowel syndrome)   . Hyperlipemia     takes Crestor daily  . ALOPECIA NEC   . Seasonal allergies     takes Zyrtec daily  . Headache(784.0)     occasionally;r/t sinus   . Joint pain   . History of colon polyps   . Diabetes mellitus type II     takes Metformin and Flexpen daily  . Hypertension     takes Enalapril daily  . GERD (gastroesophageal reflux disease)     takes Nexium bid  . Osteomyelitis   . Insomnia     takes Elavil nightly and as well as neuropathy  . Peripheral neuropathy   . Diverticulosis   . ALLERGIC RHINITIS     takes Zyrtec daily    Past Surgical History  Procedure Laterality Date  . Vaginal hysterectomy  2001  . Oophorectomy  2001   . Tubal ligation    . Rotator cuff repair Right   . Laparoscopic appendectomy  01/05/2011    Procedure: APPENDECTOMY LAPAROSCOPIC;  Surgeon: Pedro Earls, MD;  Location: [facility];  Service: General;  Laterality: N/A;  . Appendectomy  2012  . Colonoscopy    . Toe amputation  12/28/2011    rt second toe  . Amputation  12/28/2011    Procedure: AMPUTATION DIGIT;  Surgeon: Newt Minion, MD;  Location: [facility];  Service: Orthopedics;  Laterality: Right;  Right Foot 2nd Toe Amputation at MTP (metatarsophalangeal joint)  . Amputation Right 04/25/2012    Procedure: Right Foot 3rd Toe Amputation;  Surgeon: Newt Minion, MD;  Location: [facility];  Service: Orthopedics;  Laterality: Right;  Right Foot Third Toe Amputation   . Amputation Right 07/27/2012    Procedure: Right 4th Toe Amputation at Metatarsophalangeal;  Surgeon: Newt Minion, MD;  Location: [facility];  Service: Orthopedics;  Laterality: Right;  Right 4th Toe Amputation at Metatarsophalangeal    Family History  Problem Relation Age of Onset  . Lung cancer Mother 72  . Cancer Mother     lung  . Hypertension Father   . Hyperlipidemia Father   . Diabetes Father   . Coronary artery disease Father   .  Dementia Father   . Stomach cancer Paternal Aunt   . Cancer Paternal Aunt     stomach  . Brain cancer Paternal Uncle   . Cancer Paternal Uncle     brain  . Colon cancer Neg Hx   . Irritable bowel syndrome      Several family members on fathers side   . Diabetes Other   . Cancer Maternal Aunt     stomach    History   Social History  . Marital Status: Married    Spouse Name: N/A    Number of Children: 2  . Years of Education: N/A   Occupational History  . Retired     Social History Main Topics  . Smoking status: Never Smoker   . Smokeless tobacco: Never Used  . Alcohol Use: No  . Drug Use: No  . Sexual Activity: Yes    Birth Control/ Protection: Surgical   Other Topics Concern  . None   Social History Narrative    Caffeine daily    [employer], [employer] no diploma   Married '66   1 dtr- '78; 1 son '71; 2 grandchildren   Occupation: retired 04   Dad with alzheimers-had to place in [place] (summer '10)          Current outpatient prescriptions:acetaminophen (TYLENOL) 500 MG tablet, Take 500 mg by mouth every 6 (six) hours as needed for mild pain. , Disp: , Rfl: ;  amitriptyline (ELAVIL) 25 MG tablet, Take 25 mg by mouth at bedtime., Disp: , Rfl: ;  cetirizine (ZYRTEC) 10 MG tablet, Take 10 mg by mouth daily. , Disp: , Rfl: ;  Fluocinolone Acetonide (DERMOTIC) 0.01 % OIL, Place 1 drop in ear(s) daily as needed (itching). , Disp: , Rfl:  gabapentin (NEURONTIN) 300 MG capsule, Take 300 mg by mouth at bedtime., Disp: , Rfl: ;  glucose blood (ONE TOUCH ULTRA TEST) test strip, Use to test blood sugar 3 times daily as instructed. Dx code: 10.62, Disp: 300 each, Rfl: 3;  HYDROcodone-acetaminophen (NORCO/VICODIN) 5-325 MG per tablet, Take 1 tablet by mouth every 4 (four) hours as needed., Disp: 24 tablet, Rfl: 0 insulin NPH Human (HUMULIN N,NOVOLIN N) 100 UNIT/ML injection, Inject 17-20 Units into the skin 2 (two) times daily before a meal., Disp: , Rfl: ;  insulin regular (NOVOLIN R,HUMULIN R) 100 units/mL injection, Inject 6-10 Units into the skin 3 (three) times daily before meals. Sliding scale, Disp: , Rfl: ;  Insulin Syringe-Needle U-100 31G X 5/16" 1 ML MISC, Use 2x a day, Disp: 100 each, Rfl: 11 lovastatin (MEVACOR) 20 MG tablet, Take 1 tablet (20 mg total) by mouth at bedtime., Disp: 90 tablet, Rfl: 3;  metFORMIN (GLUCOPHAGE) 1000 MG tablet, Take 1 tablet (1,000 mg total) by mouth 2 (two) times daily with a meal., Disp: 180 tablet, Rfl: 3;  Multiple Vitamin (MULTIVITAMIN WITH MINERALS) TABS tablet, Take 1 tablet by mouth daily., Disp: , Rfl:  naproxen sodium (ANAPROX) 220 MG tablet, Take 220 mg by mouth 2 (two) times daily as needed (for pain)., Disp: , Rfl: ;  omeprazole (PRILOSEC) 20 MG capsule, Take 2 capsules (40 mg total)  by mouth 2 (two) times daily., Disp: 360 capsule, Rfl: 3;  QVAR 40 MCG/ACT inhaler, , Disp: , Rfl: ;  fluticasone (FLONASE) 50 MCG/ACT nasal spray, Place 2 sprays into both nostrils daily., Disp: 16 g, Rfl: 2 losartan (COZAAR) 25 MG tablet, Take 1 tablet (25 mg total) by mouth daily., Disp: 90 tablet, Rfl: 1  EXAM:  Filed Vitals:   10/25/13 1437  BP: 130/84  Temp: 98.3 F (36.8 C)    Body mass index is 33.09 kg/(m^2).  GENERAL: vitals reviewed and listed above, alert, oriented, appears well hydrated and in no acute distress  HEENT: atraumatic, conjunttiva clear, no obvious abnormalities on inspection of external nose and ears, normal appearance of ear canals and TMs, clear nasal congestion, mild post oropharyngeal erythema with PND, no tonsillar edema or exudate, no sinus TTP  NECK: no obvious masses on inspection  LUNGS: clear to auscultation bilaterally, no wheezes, rales or rhonchi, good air movement  CV: HRRR, no peripheral edema  ABD: BS+, soft, diastasis recti, epigastric TTP on deep palpation, not worse with activation of abdominal wall muscles  MS: moves all extremities without noticeable abnormality  PSYCH: pleasant and cooperative, no obvious depression or anxiety  ASSESSMENT AND PLAN:  Discussed the following assessment and plan:  Cough - Plan: Ambulatory referral to Pulmonology -discuss many potential etiologies for chornic cough including most likely - PND from allergic rhinitis, GERD, ? Mild asthma vs other -advised flonase dialy, PFTs, CT chest -she has several family members whom see Dr. Lenna Gilford and opted for pulm referral - will start INS and see GI as well -also opted to stop acei  Essential hypertension - Plan: losartan (COZAAR) 25 MG tablet in place of acei -follow up in 1 month  Allergic rhinitis, unspecified allergic rhinitis type - Plan: fluticasone (FLONASE) 50 MCG/ACT nasal spray  Epigastric pain -while possible, I am not 100% sure this is a pulled  muscle as degree of pain seems extreme for this dx and findings on exam do not support this -had a negative CT scan and neg eval by surgery -advised eval up with her gastroenterologist   -Patient advised to return or notify a doctor immediately if symptoms worsen or persist or new concerns arise.  Patient Instructions  -add flonase 2 sprays each nostril every day  -stop the enalopril and start losartan instead  -schedule follow up with Dr Fuller Plan about the abdominal pain  -we sent a referral to Dr. Lenna Gilford about the cough     Lucretia Kern.

## 2013-10-28 ENCOUNTER — Telehealth: Payer: Self-pay | Admitting: Family Medicine

## 2013-10-28 ENCOUNTER — Telehealth: Payer: Self-pay | Admitting: Gastroenterology

## 2013-10-28 ENCOUNTER — Ambulatory Visit (INDEPENDENT_AMBULATORY_CARE_PROVIDER_SITE_OTHER): Payer: Medicare Other | Admitting: Physician Assistant

## 2013-10-28 ENCOUNTER — Encounter: Payer: Self-pay | Admitting: Physician Assistant

## 2013-10-28 VITALS — BP 116/66 | HR 104 | Ht 65.0 in | Wt 202.4 lb

## 2013-10-28 DIAGNOSIS — R1013 Epigastric pain: Secondary | ICD-10-CM

## 2013-10-28 DIAGNOSIS — K219 Gastro-esophageal reflux disease without esophagitis: Secondary | ICD-10-CM

## 2013-10-28 MED ORDER — OMEPRAZOLE 20 MG PO CPDR: 40.0000 mg | DELAYED_RELEASE_CAPSULE | Freq: Two times a day (BID) | ORAL | Status: DC

## 2013-10-28 NOTE — Progress Notes (Signed)
Patient ID: Beth Holden, female   DOB: [birth year], 68 y.o.   MRN: [id number]     History of Present Illness: Beth Holden is a 29 or old female referred by Dr. Maudie Mercury  due to epigastric pain and cough. The patient states that she has been coughing for months. She describes her cough as dry and nonproductive. It is not worse at night, she says she coughs all day and all night. Approximately 2 weeks ago, she went to the beach at which time she says her cough got worse she coughed so much on the way home that she developed epigastric pain and was seen in the emergency room. She had a chest x-ray which was normal an abdominal CT that was normal her blood work was normal. She was then referred to Dr. Zella Richer of surgery for a possible hernia, and told she had no hernia but that she had a muscle strain.  She then saw her primary care physician Dr. Maudie Mercury, and was told to come here. She also has an appointment to see pulmonology on Wednesday.        She has a long-standing history of GERD for which she has been on Nexium. Despite using her Nexium she has been having occasional breakthrough heartburn for the past 2 weeks.She is currently in the donut hole with her insurance, and so a prescription for omeprazole 20 mg 2 tablets twice a day was sent in for her. She has no dysphasia and she has had no nocturnal regurgitation. When she saw her primary care physician, her Ace inhibitor medication was discontinued and a prescription for losartan was sent in for her. However, since she has already paid for her ACE inhibitor she has not picked up her losartan and continues to take her ACE inhibitor. She states that she plans on picking up her losartan later this week and stopping her ACE inhibitor. She has not had any dysphagia. She has no regurgitation. She has not had any fever, chills ,night sweats, anorexia, or weight loss.    Past Medical History  Diagnosis Date  . Erosive esophagitis   . Peripheral neuropathy   . IBS  (irritable bowel syndrome)   . Hyperlipemia     takes Crestor daily  . ALOPECIA NEC   . Seasonal allergies     takes Zyrtec daily  . Headache(784.0)     occasionally;r/t sinus   . Joint pain   . History of colon polyps   . Diabetes mellitus type II     takes Metformin and Flexpen daily  . Hypertension     takes Enalapril daily  . GERD (gastroesophageal reflux disease)     takes Nexium bid  . Osteomyelitis   . Insomnia     takes Elavil nightly and as well as neuropathy  . Peripheral neuropathy   . Diverticulosis   . ALLERGIC RHINITIS     takes Zyrtec daily    Past Surgical History  Procedure Laterality Date  . Vaginal hysterectomy  2001  . Oophorectomy  2001  . Tubal ligation    . Rotator cuff repair Right   . Laparoscopic appendectomy  01/05/2011    Procedure: APPENDECTOMY LAPAROSCOPIC;  Surgeon: Pedro Earls, MD;  Location: [facility];  Service: General;  Laterality: N/A;  . Appendectomy  2012  . Colonoscopy    . Toe amputation  12/28/2011    rt second toe  . Amputation  12/28/2011    Procedure: AMPUTATION DIGIT;  Surgeon: Newt Minion, MD;  Location: [facility];  Service: Orthopedics;  Laterality: Right;  Right Foot 2nd Toe Amputation at MTP (metatarsophalangeal joint)  . Amputation Right 04/25/2012    Procedure: Right Foot 3rd Toe Amputation;  Surgeon: Newt Minion, MD;  Location: [facility];  Service: Orthopedics;  Laterality: Right;  Right Foot Third Toe Amputation   . Amputation Right 07/27/2012    Procedure: Right 4th Toe Amputation at Metatarsophalangeal;  Surgeon: Newt Minion, MD;  Location: [facility];  Service: Orthopedics;  Laterality: Right;  Right 4th Toe Amputation at Metatarsophalangeal   Family History  Problem Relation Age of Onset  . Lung cancer Mother 48  . Cancer Mother     lung  . Hypertension Father   . Hyperlipidemia Father   . Diabetes Father   . Coronary artery disease Father   . Dementia Father   . Stomach cancer Paternal Aunt   . Cancer Paternal  Aunt     stomach  . Brain cancer Paternal Uncle   . Cancer Paternal Uncle     brain  . Colon cancer Neg Hx   . Irritable bowel syndrome      Several family members on fathers side   . Diabetes Other   . Cancer Maternal Aunt     stomach   History  Substance Use Topics  . Smoking status: Never Smoker   . Smokeless tobacco: Never Used  . Alcohol Use: No   Current Outpatient Prescriptions  Medication Sig Dispense Refill  . amitriptyline (ELAVIL) 25 MG tablet Take 25 mg by mouth at bedtime.      . cetirizine (ZYRTEC) 10 MG tablet Take 10 mg by mouth daily.       . Fluocinolone Acetonide (DERMOTIC) 0.01 % OIL Place 1 drop in ear(s) daily as needed (itching).       . gabapentin (NEURONTIN) 300 MG capsule Take 300 mg by mouth at bedtime.      Marland Kitchen glucose blood (ONE TOUCH ULTRA TEST) test strip Use to test blood sugar 3 times daily as instructed. Dx code: 250.62  300 each  3  . insulin NPH Human (HUMULIN N,NOVOLIN N) 100 UNIT/ML injection Inject 17-20 Units into the skin 2 (two) times daily before a meal.      . insulin regular (NOVOLIN R,HUMULIN R) 100 units/mL injection Inject 6-10 Units into the skin 3 (three) times daily before meals. Sliding scale      . Insulin Syringe-Needle U-100 31G X 5/16" 1 ML MISC Use 2x a day  100 each  11  . lovastatin (MEVACOR) 20 MG tablet Take 1 tablet (20 mg total) by mouth at bedtime.  90 tablet  3  . metFORMIN (GLUCOPHAGE) 1000 MG tablet Take 1 tablet (1,000 mg total) by mouth 2 (two) times daily with a meal.  180 tablet  3  . Multiple Vitamin (MULTIVITAMIN WITH MINERALS) TABS tablet Take 1 tablet by mouth daily.      Marland Kitchen acetaminophen (TYLENOL) 500 MG tablet Take 500 mg by mouth every 6 (six) hours as needed for mild pain.       . fluticasone (FLONASE) 50 MCG/ACT nasal spray Place 2 sprays into both nostrils daily.  16 g  2  . losartan (COZAAR) 25 MG tablet Take 1 tablet (25 mg total) by mouth daily.  90 tablet  1  . omeprazole (PRILOSEC) 20 MG capsule Take  2 capsules (40 mg total) by mouth 2 (two) times daily.  360 capsule  3  . QVAR 40 MCG/ACT  inhaler Inhale 1 puff into the lungs 2 (two) times daily.        No current facility-administered medications for this visit.   Allergies  Allergen Reactions  . Codeine     Makes her crazy  . Propoxyphene Hcl Itching      Review of Systems: Gen: Denies any fever, chills, sweats CV: Denies angina, palpitations, syncope, orthopnea, PND, peripheral edema, and claudication. Resp: Denies dyspnea at rest GI: Denies vomiting blood, jaundice, and fecal incontinence.   Denies dysphagia or odynophagia. GU : Denies urinary burning, blood in urine, urinary frequency, urinary hesitancy, nocturnal urination, and urinary incontinence. MS: Denies joint pain, limitation of movement, and swelling, stiffness, low back pain, extremity pain. Denies muscle weakness, cramps, atrophy.  Derm: Denies rash, itching, dry skin, hives, moles, warts, or unhealing ulcers.  Psych: Denies depression, anxiety, memory loss, suicidal ideation, hallucinations, paranoia, and confusion. Heme: Denies bruising, bleeding, and enlarged lymph nodes. Neuro:  Denies any headaches, dizziness, paresthesia Endo:  Denies any problems with DM, thyroid, adrenal  LAB RESULTS: Blood work on October 12 showed a lipase of 16. Comprehensive metabolic panel revealed an AST of 19, ALT 16, alkaline phosphatase 61, total bili 0.3 CBC had a white blood cell count of 11.4. Hemoglobin was 12 hematocrit was 37.1 platelets were 408.    Studies:   Ct Abdomen Pelvis Wo Contrast  10/21/2013   CLINICAL DATA:  Epigastric pain which began 24 hr prior. Sharp pain.  EXAM: CT ABDOMEN AND PELVIS WITHOUT CONTRAST  TECHNIQUE: Multidetector CT imaging of the abdomen and pelvis was performed following the standard protocol without IV contrast.  COMPARISON:  CT 01/05/2011  FINDINGS: Lower chest:  Mild left basilar atelectasis.  No pericardial fluid.  Hepatobiliary: No focal  hepatic lesions non contrast exam. Mild fatty infiltration along the falciform ligament. Gallbladder is normal.  Pancreas: Pancreas is normal. No duct dilatation. No pancreatic inflammation.  Spleen: Normal spleen  Adrenals/urinary tract: Adrenal glands kidneys are normal. No ureteral obstruction. Normal bladder.  Stomach/Bowel: Stomach, small bowel, cecum are normal. Colon rectosigmoid colon are normal.  Vascular/Lymphatic: Abdominal aorta is normal caliber. There is no retroperitoneal or periportal lymphadenopathy. No pelvic lymphadenopathy.  Reproductive: Post hysterectomy.  Musculoskeletal: Chronic anterolisthesis of L5 on S1 unchanged. Bilateral pars defects at this level.  Other: No free fluid the abdomen pelvis. No ventral or inguinal hernia.  IMPRESSION: 1. No evidence of hernia. 2. No acute findings in the abdomen or pelvis. 3. 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   Electronically Signed   By: Suzy Bouchard M.D.   On: 10/21/2013 22:49   Dg Chest 2 View  10/21/2013   CLINICAL DATA:  Epigastric pain for 1 day.  EXAM: CHEST  2 VIEW  COMPARISON:  PA and lateral chest 04/24/2012.  FINDINGS: Lung volumes are low but the lungs are clear. Heart size is normal. No pneumothorax or pleural effusion. Status post rotator cuff repair on the right.  IMPRESSION: No acute disease.   Electronically Signed   By: Inge Rise M.D.   On: 10/21/2013 21:38     Physical Exam: General: Pleasant, well developed , well nourished female in no acute distress Head: Normocephalic and atraumatic Eyes:  sclerae anicteric, conjunctiva pink  Ears: Normal auditory acuity Lungs: Clear throughout to auscultation Heart: Regular rate and rhythm Abdomen: Soft, non distended, non-tender. No masses, no hepatomegaly. Normal bowel sounds Rectal:deferred Musculoskeletal: Symmetrical with no gross deformities  Extremities: No edema  Neurological:  Alert oriented x 4, grossly  nonfocal Psychological:  Alert and cooperative. Normal mood and affect.  Assessment and Recommendations:  68 year old female with a several month history of nonproductive cough and epigastric pain here for evaluation. Her epigastric pain seems to be exacerbated after prolonged coughing fits. It appears that her epigastric pain may be musculoskeletal in nature. Her cough may in fact be due to her ACE inhibitor and she has been urged to discontinue her enalapril and start her losartan as instructed by her primary care physician. She states she will do this today. She has also been reminded to adhere to an anti-reflux regimen. She has been urged to start omeprazole 40 mg twice daily. She has been asked to contact us in 2-3 weeks to let us know if her cough is decreasing off her ACE inhibitor. She will follow up in the office in 3-4 weeks.        Ms.E@ PA-C @TD

## 2013-10-28 NOTE — Progress Notes (Signed)
Reviewed and agree with management plan.  Malcolm T. Stark, MD FACG 

## 2013-10-28 NOTE — Telephone Encounter (Signed)
Ok. She can cancel appt with pulm if she wishes and reschedule if cough persists.

## 2013-10-28 NOTE — Patient Instructions (Signed)
Information on reflux is below for your review. Please stop Enalapril and call back in two weeks to give an update on how your cough is doing. When you call you can speak to [facility], she is the nurse for [place]. Start your Losartan as recommended by your primary care doctor. I sent in a prescription for Omeprazole 20 mg, for your to take twice daily. Please call at the end of this month to make a follow up with Cecille Rubin Hvozdovic, PA-C for the first week of November. ______________________________________________________________________________________________________________________  Gastroesophageal Reflux Disease, Adult Gastroesophageal reflux disease (GERD) happens when acid from your stomach flows up into the esophagus. When acid comes in contact with the esophagus, the acid causes soreness (inflammation) in the esophagus. Over time, GERD may create small holes (ulcers) in the lining of the esophagus. CAUSES   Increased body weight. This puts pressure on the stomach, making acid rise from the stomach into the esophagus.  Smoking. This increases acid production in the stomach.  Drinking alcohol. This causes decreased pressure in the lower esophageal sphincter (valve or ring of muscle between the esophagus and stomach), allowing acid from the stomach into the esophagus.  Late evening meals and a full stomach. This increases pressure and acid production in the stomach.  A malformed lower esophageal sphincter. Sometimes, no cause is found. SYMPTOMS   Burning pain in the lower part of the mid-chest behind the breastbone and in the mid-stomach area. This may occur twice a week or more often.  Trouble swallowing.  Sore throat.  Dry cough.  Asthma-like symptoms including chest tightness, shortness of breath, or wheezing. DIAGNOSIS  Your caregiver may be able to diagnose GERD based on your symptoms. In some cases, X-rays and other tests may be done to check for complications or to check the  condition of your stomach and esophagus. TREATMENT  Your caregiver may recommend over-the-counter or prescription medicines to help decrease acid production. Ask your caregiver before starting or adding any new medicines.  HOME CARE INSTRUCTIONS   Change the factors that you can control. Ask your caregiver for guidance concerning weight loss, quitting smoking, and alcohol consumption.  Avoid foods and drinks that make your symptoms worse, such as:  Caffeine or alcoholic drinks.  Chocolate.  Peppermint or mint flavorings.  Garlic and onions.  Spicy foods.  Citrus fruits, such as oranges, lemons, or limes.  Tomato-based foods such as sauce, chili, salsa, and pizza.  Fried and fatty foods.  Avoid lying down for the 3 hours prior to your bedtime or prior to taking a nap.  Eat small, frequent meals instead of large meals.  Wear loose-fitting clothing. Do not wear anything tight around your waist that causes pressure on your stomach.  Raise the head of your bed 6 to 8 inches with wood blocks to help you sleep. Extra pillows will not help.  Only take over-the-counter or prescription medicines for pain, discomfort, or fever as directed by your caregiver.  Do not take aspirin, ibuprofen, or other nonsteroidal anti-inflammatory drugs (NSAIDs). SEEK IMMEDIATE MEDICAL CARE IF:   You have pain in your arms, neck, jaw, teeth, or back.  Your pain increases or changes in intensity or duration.  You develop nausea, vomiting, or sweating (diaphoresis).  You develop shortness of breath, or you faint.  Your vomit is green, yellow, black, or looks like coffee grounds or blood.  Your stool is red, bloody, or black. These symptoms could be signs of other problems, such as heart disease, gastric bleeding,  or esophageal bleeding. MAKE SURE YOU:   Understand these instructions.  Will watch your condition.  Will get help right away if you are not doing well or get worse. Document  Released: 10/06/2004 Document Revised: 03/21/2011 Document Reviewed: 07/16/2010 [facility] Patient Information 2015 [place], [place]. This information is not intended to replace advice given to you by your health care provider. Make sure you discuss any questions you have with your health care provider.

## 2013-10-28 NOTE — Telephone Encounter (Signed)
emmi emailed °

## 2013-10-28 NOTE — Telephone Encounter (Signed)
Patient will come in today and see Lori Hvozdovic, PA at 1:45

## 2013-10-28 NOTE — Telephone Encounter (Signed)
Pt called to say that she went to see her GI doctor and was told that the blood pressure medicine may be the cause of her cough. GI doctor told her to take the new bp medicine for 2 weeks and see if the cough stops. Pt called to say that she want to cancel the pulmonary appt and she would like to know if Dr Maudie Mercury agrees. She want to see if the new medicine will stop the cough and if it does not she will schedule with the pulmonary. She said if Dr Maudie Mercury want her to keep the appt then she will.    Phone number [phone]

## 2013-10-29 NOTE — Telephone Encounter (Signed)
Patient informed. 

## 2013-10-30 ENCOUNTER — Institutional Professional Consult (permissible substitution): Payer: Medicare Other | Admitting: Internal Medicine

## 2013-10-31 NOTE — Telephone Encounter (Signed)
error 

## 2013-11-01 DIAGNOSIS — E1142 Type 2 diabetes mellitus with diabetic polyneuropathy: Secondary | ICD-10-CM | POA: Diagnosis not present

## 2013-11-01 DIAGNOSIS — L97411 Non-pressure chronic ulcer of right heel and midfoot limited to breakdown of skin: Secondary | ICD-10-CM | POA: Diagnosis not present

## 2013-11-05 ENCOUNTER — Telehealth: Payer: Self-pay | Admitting: Internal Medicine

## 2013-11-05 ENCOUNTER — Other Ambulatory Visit: Payer: Self-pay | Admitting: *Deleted

## 2013-11-05 MED ORDER — GLUCOSE BLOOD VI STRP: ORAL_STRIP | Status: DC

## 2013-11-05 NOTE — Telephone Encounter (Signed)
[facility] needs a new rx for test strips with the ICD10 code for ins. Done.

## 2013-11-05 NOTE — Telephone Encounter (Signed)
Done

## 2013-11-05 NOTE — Telephone Encounter (Signed)
[facility] needs the ICD10 code for the pt's test strips please  Patient is going out of town tomorrow needs it by today

## 2013-11-11 ENCOUNTER — Telehealth: Payer: Self-pay | Admitting: Internal Medicine

## 2013-11-11 NOTE — Telephone Encounter (Signed)
Returned pt's call. Advised pt that we do have a One Touch Ultra 2. Pt to send her husband to pick up the new meter.

## 2013-11-11 NOTE — Telephone Encounter (Signed)
Pt would like to know if we could give her a blood sugar monitor hers is broken she had the ultra touch one

## 2013-11-22 ENCOUNTER — Other Ambulatory Visit: Payer: Self-pay | Admitting: Internal Medicine

## 2013-11-29 DIAGNOSIS — E1142 Type 2 diabetes mellitus with diabetic polyneuropathy: Secondary | ICD-10-CM | POA: Diagnosis not present

## 2013-11-29 DIAGNOSIS — L97411 Non-pressure chronic ulcer of right heel and midfoot limited to breakdown of skin: Secondary | ICD-10-CM | POA: Diagnosis not present

## 2013-11-29 DIAGNOSIS — M6702 Short Achilles tendon (acquired), left ankle: Secondary | ICD-10-CM | POA: Diagnosis not present

## 2013-11-29 DIAGNOSIS — B351 Tinea unguium: Secondary | ICD-10-CM | POA: Diagnosis not present

## 2013-12-13 ENCOUNTER — Encounter: Payer: Self-pay | Admitting: Internal Medicine

## 2013-12-13 ENCOUNTER — Ambulatory Visit (INDEPENDENT_AMBULATORY_CARE_PROVIDER_SITE_OTHER): Payer: Medicare Other | Admitting: Internal Medicine

## 2013-12-13 VITALS — BP 150/70 | Temp 98.2°F | Wt 208.4 lb

## 2013-12-13 DIAGNOSIS — J029 Acute pharyngitis, unspecified: Secondary | ICD-10-CM | POA: Diagnosis not present

## 2013-12-13 DIAGNOSIS — Z20818 Contact with and (suspected) exposure to other bacterial communicable diseases: Secondary | ICD-10-CM

## 2013-12-13 DIAGNOSIS — Z2089 Contact with and (suspected) exposure to other communicable diseases: Secondary | ICD-10-CM

## 2013-12-13 LAB — POCT RAPID STREP A (OFFICE): Rapid Strep A Screen: NEGATIVE

## 2013-12-13 MED ORDER — AMOXICILLIN 500 MG PO CAPS: 500.0000 mg | ORAL_CAPSULE | Freq: Two times a day (BID) | ORAL | Status: DC

## 2013-12-13 NOTE — Progress Notes (Signed)
Pre visit review using our clinic review tool, if applicable. No additional management support is needed unless otherwise documented below in the visit note.   Chief Complaint  Patient presents with  . Sore Throat    Granddaughter has strep throat  . Generalized Body Aches    HPI: Beth Holden 68 y.o.    comesin for sda  appt  Onset  St and chest   Tight?   But exposed to strep  hurts to swallow  Grand child strep age 68 . Dx today .  Sick for few days .Marland Kitchen Now has body aches and sort throat at this time no cough or runny nose .  Remote hx of strep. taks care of 2 GKs ROS: See pertinent positives and negatives per HPI. No cough rash    Past Medical History  Diagnosis Date  . Erosive esophagitis   . Peripheral neuropathy   . IBS (irritable bowel syndrome)   . Hyperlipemia     takes Crestor daily  . ALOPECIA NEC   . Seasonal allergies     takes Zyrtec daily  . Headache(784.0)     occasionally;r/t sinus   . Joint pain   . History of colon polyps   . Diabetes mellitus type II     takes Metformin and Flexpen daily  . Hypertension     takes Enalapril daily  . GERD (gastroesophageal reflux disease)     takes Nexium bid  . Osteomyelitis   . Insomnia     takes Elavil nightly and as well as neuropathy  . Peripheral neuropathy   . Diverticulosis   . ALLERGIC RHINITIS     takes Zyrtec daily    Family History  Problem Relation Age of Onset  . Lung cancer Mother 19  . Cancer Mother     lung  . Hypertension Father   . Hyperlipidemia Father   . Diabetes Father   . Coronary artery disease Father   . Dementia Father   . Stomach cancer Paternal Aunt   . Cancer Paternal Aunt     stomach  . Brain cancer Paternal Uncle   . Cancer Paternal Uncle     brain  . Colon cancer Neg Hx   . Irritable bowel syndrome      Several family members on fathers side   . Diabetes Other   . Cancer Maternal Aunt     stomach    History   Social History  . Marital Status: Married   Spouse Name: N/A    Number of Children: 2  . Years of Education: N/A   Occupational History  . Retired     Social History Main Topics  . Smoking status: Never Smoker   . Smokeless tobacco: Never Used  . Alcohol Use: No  . Drug Use: No  . Sexual Activity: Yes    Birth Control/ Protection: Surgical   Other Topics Concern  . None   Social History Narrative   Caffeine daily    [employer], [employer] no diploma   Married '66   1 dtr- '78; 1 son '71; 2 grandchildren   Occupation: retired 04   Dad with alzheimers-had to place in [place] (summer '10)          Outpatient Encounter Prescriptions as of 12/13/2013  Medication Sig  . acetaminophen (TYLENOL) 500 MG tablet Take 500 mg by mouth every 6 (six) hours as needed for mild pain.   Marland Kitchen amitriptyline (ELAVIL) 25 MG tablet Take 25 mg by mouth  at bedtime.  . cetirizine (ZYRTEC) 10 MG tablet Take 10 mg by mouth daily.   . Fluocinolone Acetonide (DERMOTIC) 0.01 % OIL Place 1 drop in ear(s) daily as needed (itching).   . gabapentin (NEURONTIN) 300 MG capsule Take 300 mg by mouth at bedtime.  Marland Kitchen glucose blood (ONE TOUCH ULTRA TEST) test strip Use to test blood sugar 3 times daily as instructed. Dx code: E11.49.  Marland Kitchen insulin NPH Human (HUMULIN N,NOVOLIN N) 100 UNIT/ML injection Inject 17-20 Units into the skin 2 (two) times daily before a meal.  . insulin regular (NOVOLIN R,HUMULIN R) 100 units/mL injection Inject 6-10 Units into the skin 3 (three) times daily before meals. Sliding scale  . Insulin Syringe-Needle U-100 31G X 5/16" 1 ML MISC Use 2x a day  . losartan (COZAAR) 25 MG tablet Take 1 tablet (25 mg total) by mouth daily.  Marland Kitchen lovastatin (MEVACOR) 20 MG tablet Take 1 tablet (20 mg total) by mouth at bedtime.  . metFORMIN (GLUCOPHAGE) 1000 MG tablet TAKE 1 TABLET (1,000 MG TOTAL) BY MOUTH 2 (TWO) TIMES DAILY WITH A MEAL.  . Multiple Vitamin (MULTIVITAMIN WITH MINERALS) TABS tablet Take 1 tablet by mouth daily.  Marland Kitchen omeprazole (PRILOSEC) 20 MG capsule Take 2  capsules (40 mg total) by mouth 2 (two) times daily.  Marland Kitchen amoxicillin (AMOXIL) 500 MG capsule Take 1 capsule (500 mg total) by mouth 2 (two) times daily.  . [DISCONTINUED] fluticasone (FLONASE) 50 MCG/ACT nasal spray Place 2 sprays into both nostrils daily.  . [DISCONTINUED] QVAR 40 MCG/ACT inhaler Inhale 1 puff into the lungs 2 (two) times daily.     EXAM:  BP 150/70 mmHg  Temp(Src) 98.2 F (36.8 C) (Oral)  Wt 208 lb 6.4 oz (94.53 kg)  Body mass index is 34.68 kg/(m^2).  GENERAL: vitals reviewed and listed above, alert, oriented, appears well hydrated and in no acute distress non toxic  HEENT: atraumatic, conjunctiva  clear, no obvious abnormalities on inspection of external nose and ears OP : no lesion edema or exudate  Very red  Uvula and throat    No  Exudate   NECK: no obvious masses on inspection palpation tender ac nodes  No  LUNGS: clear to auscultation bilaterally, no wheezes, rales or rhonchi, good air movement CV: HRRR, no clubbing cyanosis or  peripheral edema nl cap refill  MS: moves all extremities without noticeable focal  abnormality PSYCH: pleasant and cooperative, no obvious depression or anxiety  ASSESSMENT AND PLAN:  Discussed the following assessment and plan:  Acute pharyngitis, unspecified pharyngitis type - Plan: POCT rapid strep A, Culture, Group A Strep  Exposure to Streptococcal pharyngitis - failry intense exposures  - Plan: POCT rapid strep A, Culture, Group A Strep  -Patient advised to return or notify health care team  if symptoms worsen ,persist or new concerns arise.  Patient Instructions  Rapid strep is negative but culture is a more complete tests.  i f  Throat getting worse can add antibiotic  Before back if pain is getting worse . Could still be a viral infection if turns into a cold like syndrome.   Standley Brooking. Panosh M.D.

## 2013-12-13 NOTE — Patient Instructions (Signed)
Rapid strep is negative but culture is a more complete tests.  i f  Throat getting worse can add antibiotic  Before back if pain is getting worse . Could still be a viral infection if turns into a cold like syndrome.

## 2013-12-15 LAB — CULTURE, GROUP A STREP: Organism ID, Bacteria: NORMAL

## 2013-12-16 ENCOUNTER — Telehealth: Payer: Self-pay | Admitting: Internal Medicine

## 2013-12-16 NOTE — Telephone Encounter (Signed)
Can have Mucinex DM. She can ask the pharmacist if there is a diabetic formulation (I am not sure).

## 2013-12-16 NOTE — Telephone Encounter (Signed)
Please read note below and advise.  

## 2013-12-16 NOTE — Telephone Encounter (Signed)
Patient called and has a very bad head cold  Can she take Mucinex while being a diabetic ? If she can what kind of mucinex   Patient has been to her PCP    Please advise   Thank you

## 2013-12-17 NOTE — Telephone Encounter (Signed)
Called pt and advised her per Dr Gherghe's note. Pt understood.  

## 2013-12-27 DIAGNOSIS — L97411 Non-pressure chronic ulcer of right heel and midfoot limited to breakdown of skin: Secondary | ICD-10-CM | POA: Diagnosis not present

## 2013-12-27 DIAGNOSIS — E1142 Type 2 diabetes mellitus with diabetic polyneuropathy: Secondary | ICD-10-CM | POA: Diagnosis not present

## 2013-12-27 DIAGNOSIS — B351 Tinea unguium: Secondary | ICD-10-CM | POA: Diagnosis not present

## 2014-01-02 ENCOUNTER — Ambulatory Visit: Payer: Medicare Other | Admitting: Family Medicine

## 2014-01-02 ENCOUNTER — Encounter: Payer: Self-pay | Admitting: Family Medicine

## 2014-01-02 ENCOUNTER — Ambulatory Visit (INDEPENDENT_AMBULATORY_CARE_PROVIDER_SITE_OTHER): Payer: Medicare Other | Admitting: Family Medicine

## 2014-01-02 ENCOUNTER — Other Ambulatory Visit: Payer: Self-pay | Admitting: *Deleted

## 2014-01-02 VITALS — BP 122/82 | HR 98 | Temp 98.3°F | Ht 65.0 in | Wt 205.8 lb

## 2014-01-02 DIAGNOSIS — I10 Essential (primary) hypertension: Secondary | ICD-10-CM | POA: Diagnosis not present

## 2014-01-02 DIAGNOSIS — R0600 Dyspnea, unspecified: Secondary | ICD-10-CM

## 2014-01-02 DIAGNOSIS — R600 Localized edema: Secondary | ICD-10-CM | POA: Diagnosis not present

## 2014-01-02 NOTE — Progress Notes (Signed)
Pre visit review using our clinic review tool, if applicable. No additional management support is needed unless otherwise documented below in the visit note. 

## 2014-01-02 NOTE — Patient Instructions (Addendum)
BEFORE YOU LEAVE: -compression stocking rx - without toes -schedule follow up in 1 month  -wear compression socks during day as tolerated and elevated feet for 30 minutes at least once dialy  -low sodium diet  -We placed a referral for you as discussed for the heart study. It usually takes about 1-2 weeks to process and schedule this referral. If you have not heard from [place] regarding this appointment in 2 weeks please contact our office.

## 2014-01-02 NOTE — Progress Notes (Addendum)
HPI:  Beth Holden is a pleasant 68 yo F with HTN, HLD, GERD, IBS, DM here for an acute visit for:  LE edema: -reports: bilat LE ankle edema - for several weeks -denies: CP, palpitations, DOE -she is recovering from a cold and has had a cough and mild SOB related to this the last few weeks as well -daughter is worried about her heart  ROS: See pertinent positives and negatives per HPI.  Past Medical History  Diagnosis Date  . Erosive esophagitis   . Peripheral neuropathy   . IBS (irritable bowel syndrome)   . Hyperlipemia     takes Crestor daily  . ALOPECIA NEC   . Seasonal allergies     takes Zyrtec daily  . Headache(784.0)     occasionally;r/t sinus   . Joint pain   . History of colon polyps   . Diabetes mellitus type II     takes Metformin and Flexpen daily  . Hypertension     takes Enalapril daily  . GERD (gastroesophageal reflux disease)     takes Nexium bid  . Osteomyelitis   . Insomnia     takes Elavil nightly and as well as neuropathy  . Peripheral neuropathy   . Diverticulosis   . ALLERGIC RHINITIS     takes Zyrtec daily    Past Surgical History  Procedure Laterality Date  . Vaginal hysterectomy  2001  . Oophorectomy  2001  . Tubal ligation    . Rotator cuff repair Right   . Laparoscopic appendectomy  01/05/2011    Procedure: APPENDECTOMY LAPAROSCOPIC;  Surgeon: Pedro Earls, MD;  Location: [facility];  Service: General;  Laterality: N/A;  . Appendectomy  2012  . Colonoscopy    . Toe amputation  12/28/2011    rt second toe  . Amputation  12/28/2011    Procedure: AMPUTATION DIGIT;  Surgeon: Newt Minion, MD;  Location: [facility];  Service: Orthopedics;  Laterality: Right;  Right Foot 2nd Toe Amputation at MTP (metatarsophalangeal joint)  . Amputation Right 04/25/2012    Procedure: Right Foot 3rd Toe Amputation;  Surgeon: Newt Minion, MD;  Location: [facility];  Service: Orthopedics;  Laterality: Right;  Right Foot Third Toe Amputation   . Amputation Right  07/27/2012    Procedure: Right 4th Toe Amputation at Metatarsophalangeal;  Surgeon: Newt Minion, MD;  Location: [facility];  Service: Orthopedics;  Laterality: Right;  Right 4th Toe Amputation at Metatarsophalangeal    Family History  Problem Relation Age of Onset  . Lung cancer Mother 44  . Cancer Mother     lung  . Hypertension Father   . Hyperlipidemia Father   . Diabetes Father   . Coronary artery disease Father   . Dementia Father   . Stomach cancer Paternal Aunt   . Cancer Paternal Aunt     stomach  . Brain cancer Paternal Uncle   . Cancer Paternal Uncle     brain  . Colon cancer Neg Hx   . Irritable bowel syndrome      Several family members on fathers side   . Diabetes Other   . Cancer Maternal Aunt     stomach    History   Social History  . Marital Status: Married    Spouse Name: N/A    Number of Children: 2  . Years of Education: N/A   Occupational History  . Retired     Social History Main Topics  . Smoking status: Never  Smoker   . Smokeless tobacco: Never Used  . Alcohol Use: No  . Drug Use: No  . Sexual Activity: Yes    Birth Control/ Protection: Surgical   Other Topics Concern  . None   Social History Narrative   Caffeine daily    HS[employer], [employer] no diploma   Married '66   1 dtr- '78; 1 son '71; 2 grandchildren   Occupation: retired 04   Dad with alzheimers-had to place in [place] (summer '10)          Current outpatient prescriptions: acetaminophen (TYLENOL) 500 MG tablet, Take 500 mg by mouth every 6 (six) hours as needed for mild pain. , Disp: , Rfl: ;  amitriptyline (ELAVIL) 25 MG tablet, Take 25 mg by mouth at bedtime., Disp: , Rfl: ;  cetirizine (ZYRTEC) 10 MG tablet, Take 10 mg by mouth daily. , Disp: , Rfl: ;  Fluocinolone Acetonide (DERMOTIC) 0.01 % OIL, Place 1 drop in ear(s) daily as needed (itching). , Disp: , Rfl:  gabapentin (NEURONTIN) 300 MG capsule, Take 300 mg by mouth at bedtime., Disp: , Rfl: ;  glucose blood (ONE TOUCH ULTRA TEST)  test strip, Use to test blood sugar 3 times daily as instructed. Dx code: E11.49., Disp: 300 each, Rfl: 5;  insulin NPH Human (HUMULIN N,NOVOLIN N) 100 UNIT/ML injection, Inject 17-20 Units into the skin 2 (two) times daily before a meal., Disp: , Rfl:  insulin regular (NOVOLIN R,HUMULIN R) 100 units/mL injection, Inject 6-10 Units into the skin 3 (three) times daily before meals. Sliding scale, Disp: , Rfl: ;  Insulin Syringe-Needle U-100 31G X 5/16" 1 ML MISC, Use 2x a day, Disp: 100 each, Rfl: 11;  losartan (COZAAR) 25 MG tablet, Take 1 tablet (25 mg total) by mouth daily., Disp: 90 tablet, Rfl: 1 lovastatin (MEVACOR) 20 MG tablet, Take 1 tablet (20 mg total) by mouth at bedtime., Disp: 90 tablet, Rfl: 3;  metFORMIN (GLUCOPHAGE) 1000 MG tablet, TAKE 1 TABLET (1,000 MG TOTAL) BY MOUTH 2 (TWO) TIMES DAILY WITH A MEAL., Disp: 180 tablet, Rfl: 1;  Multiple Vitamin (MULTIVITAMIN WITH MINERALS) TABS tablet, Take 1 tablet by mouth daily., Disp: , Rfl:  omeprazole (PRILOSEC) 20 MG capsule, Take 2 capsules (40 mg total) by mouth 2 (two) times daily., Disp: 360 capsule, Rfl: 3  EXAM:  Filed Vitals:   01/02/14 1108  BP: 122/82  Pulse: 98  Temp: 98.3 F (36.8 C)    Body mass index is 34.25 kg/(m^2).  GENERAL: vitals reviewed and listed above, alert, oriented, appears well hydrated and in no acute distress  HEENT: atraumatic, conjunttiva clear, no obvious abnormalities on inspection of external nose and ears  NECK: no obvious masses on inspection  LUNGS: clear to auscultation bilaterally, no wheezes, rales or rhonchi, good air movement  CV: HRRR, bilat peripheral edema  MS: moves all extremities without noticeable abnormality  PSYCH: pleasant and cooperative, no obvious depression or anxiety  ASSESSMENT AND PLAN:  Discussed the following assessment and plan:  Bilateral edema of lower extremity - Plan: 2D Echocardiogram without contrast  Dyspnea  Essential hypertension  -likely venous  insufficiency and will do compression and elevation, consider changing BP med to add diuretic as well -echo per concerns and mild dyspnea though more likely related to recent VURI -return and emergency precuations discussed -low sodium diet -Patient advised to return or notify a doctor immediately if symptoms worsen or persist or new concerns arise.  Patient Instructions  BEFORE YOU LEAVE: -compression stocking  rx - without toes  -We placed a referral for you as discussed for the heart study. It usually takes about 1-2 weeks to process and schedule this referral. If you have not heard from [place] regarding this appointment in 2 weeks please contact our office.      Colin Benton R.

## 2014-01-17 ENCOUNTER — Ambulatory Visit (HOSPITAL_COMMUNITY): Payer: Medicare Other | Attending: Cardiovascular Disease | Admitting: Radiology

## 2014-01-17 ENCOUNTER — Other Ambulatory Visit (HOSPITAL_COMMUNITY): Payer: Medicare Other

## 2014-01-17 DIAGNOSIS — R600 Localized edema: Secondary | ICD-10-CM | POA: Diagnosis not present

## 2014-01-17 DIAGNOSIS — I10 Essential (primary) hypertension: Secondary | ICD-10-CM

## 2014-01-17 DIAGNOSIS — R609 Edema, unspecified: Secondary | ICD-10-CM | POA: Diagnosis not present

## 2014-01-17 NOTE — Progress Notes (Signed)
Echocardiogram performed.  

## 2014-01-21 ENCOUNTER — Encounter: Payer: Self-pay | Admitting: Gastroenterology

## 2014-01-21 ENCOUNTER — Telehealth: Payer: Self-pay | Admitting: Family Medicine

## 2014-01-21 NOTE — Telephone Encounter (Signed)
Pt would like echocardiogram result

## 2014-01-21 NOTE — Telephone Encounter (Signed)
Patient informed echo was normal.

## 2014-01-23 ENCOUNTER — Encounter: Payer: Self-pay | Admitting: Physician Assistant

## 2014-01-23 ENCOUNTER — Ambulatory Visit (INDEPENDENT_AMBULATORY_CARE_PROVIDER_SITE_OTHER): Payer: Medicare Other | Admitting: Physician Assistant

## 2014-01-23 ENCOUNTER — Telehealth: Payer: Self-pay | Admitting: *Deleted

## 2014-01-23 VITALS — BP 144/80 | HR 110 | Ht 65.0 in | Wt 206.0 lb

## 2014-01-23 DIAGNOSIS — R1013 Epigastric pain: Secondary | ICD-10-CM

## 2014-01-23 DIAGNOSIS — K589 Irritable bowel syndrome without diarrhea: Secondary | ICD-10-CM | POA: Diagnosis not present

## 2014-01-23 MED ORDER — RIFAXIMIN 550 MG PO TABS: 550.0000 mg | ORAL_TABLET | Freq: Three times a day (TID) | ORAL | Status: DC

## 2014-01-23 NOTE — Telephone Encounter (Signed)
Patient notified and she will call her PCP and have her BP checked.

## 2014-01-23 NOTE — Telephone Encounter (Signed)
Left a message for patient to call back. 

## 2014-01-23 NOTE — Patient Instructions (Signed)
We have sent the following medications to your pharmacy for you to pick up at your convenience:  [place] have been scheduled for an endoscopy. Please follow written instructions given to you at your visit today. If you use inhalers (even only as needed), please bring them with you on the day of your procedure. Your physician has requested that you go to www.startemmi.[email or web address] and enter the access code given to you at your visit today. This web site gives a general overview about your procedure. However, you should still follow specific instructions given to you by our office regarding your preparation for the procedure.

## 2014-01-23 NOTE — Progress Notes (Signed)
Patient ID: Beth Holden, female   DOB: September 25, 1945, 69 y.o.   MRN: [id number]     History of Present Illness: Beth Holden is a 69 year old female who is here for follow-up today of epigastric abdominal pain and GERD. She was last seen here on October 19 with the complaint of cough for several months. She describes her cough is dry and nonproductive, and it was worse at night. She coughed so much that she developed epigastric pain and was seen in the emergency room where she had a chest x-ray and an abdominal CT that was normal and her blood work was normal. She was evaluated by Dr. Clarise Holden of surgery for possible hernia and was told that she did not have a hernia but that she had a muscle strain. He waited by her primary care physician and sent back here. She has a long-standing history of GERD, and had been using Nexium. Despite using her Nexium she was having breakthrough heartburn. She had seen her primary care physician and was advised to discontinue her enalapril and start losartan since this change of medication she is coughing much less. When she was here she was instructed to start omeprazole 40 mg twice daily and she was advised to follow up in 3 weeks but presents today. Though she is coughing less. She complains of severe epigastric pain worse on an empty stomach and alleviated with ingestion of food the pain does not radiate . She feels excessively overly bloated and gassy and says that for the past several months her stools have been alternating between days of formed stools and days  of loose stools. She has no bright red blood per rectum or melena.   Past Medical History  Diagnosis Date  . Erosive esophagitis   . Peripheral neuropathy   . IBS (irritable bowel syndrome)   . Hyperlipemia     takes Crestor daily  . ALOPECIA NEC   . Seasonal allergies     takes Zyrtec daily  . Headache(784.0)     occasionally;r/t sinus   . Joint pain   . History of colon polyps   . Diabetes mellitus  type II     takes Metformin and Flexpen daily  . Hypertension     takes Enalapril daily  . GERD (gastroesophageal reflux disease)     takes Nexium bid  . Osteomyelitis   . Insomnia     takes Elavil nightly and as well as neuropathy  . Peripheral neuropathy   . Diverticulosis   . Anemia, mild     Past Surgical History  Procedure Laterality Date  . Vaginal hysterectomy  2001  . Oophorectomy  2001  . Tubal ligation    . Rotator cuff repair Right   . Laparoscopic appendectomy  01/05/2011    Procedure: APPENDECTOMY LAPAROSCOPIC;  Surgeon: Beth Earls, MD;  Location: [facility];  Service: General;  Laterality: N/A;  . Colonoscopy    . Amputation  12/28/2011    Procedure: AMPUTATION DIGIT;  Surgeon: Beth Minion, MD;  Location: [place];  Service: Orthopedics;  Laterality: Right;  Right Foot 2nd Toe Amputation at MTP (metatarsophalangeal joint)  . Amputation Right 04/25/2012    Procedure: Right Foot 3rd Toe Amputation;  Surgeon: Beth Minion, MD;  Location: [place] [facility];  Service: Orthopedics;  Laterality: Right;  Right Foot Third Toe Amputation   . Amputation Right 07/27/2012    Procedure: Right 4th Toe Amputation at Metatarsophalangeal;  Surgeon: Beth Minion, MD;  Location: [place];  Service: Orthopedics;  Laterality: Right;  Right 4th Toe Amputation at Metatarsophalangeal   Family History  Problem Relation Age of Onset  . Lung cancer Mother 74  . Hypertension Father   . Hyperlipidemia Father   . Diabetes Father   . Coronary artery disease Father   . Dementia Father   . Stomach cancer Paternal Aunt   . Brain cancer Paternal Uncle   . Colon cancer Neg Hx   . Irritable bowel syndrome      Several family members on fathers side   . Diabetes Other   . Stomach cancer Maternal Aunt    History  Substance Use Topics  . Smoking status: Never Smoker   . Smokeless tobacco: Never Used  . Alcohol Use: No   Current Outpatient Prescriptions  Medication Sig Dispense Refill  . acetaminophen  (TYLENOL) 500 MG tablet Take 500 mg by mouth every 6 (six) hours as needed for mild pain.     Marland Kitchen amitriptyline (ELAVIL) 25 MG tablet Take 25 mg by mouth at bedtime.    . cetirizine (ZYRTEC) 10 MG tablet Take 10 mg by mouth daily.     . Fluocinolone Acetonide (DERMOTIC) 0.01 % OIL Place 1 drop in ear(s) daily as needed (itching).     . gabapentin (NEURONTIN) 300 MG capsule Take 300 mg by mouth at bedtime.    Marland Kitchen glucose blood (ONE TOUCH ULTRA TEST) test strip Use to test blood sugar 3 times daily as instructed. Dx code: E11.49. 300 each 5  . insulin NPH Human (HUMULIN N,NOVOLIN N) 100 UNIT/ML injection Inject 17-20 Units into the skin 2 (two) times daily before a meal.    . insulin regular (NOVOLIN R,HUMULIN R) 100 units/mL injection Inject 6-10 Units into the skin 3 (three) times daily before meals. Sliding scale    . Insulin Syringe-Needle U-100 31G X 5/16" 1 ML MISC Use 2x a day 100 each 11  . losartan (COZAAR) 25 MG tablet Take 1 tablet (25 mg total) by mouth daily. 90 tablet 1  . lovastatin (MEVACOR) 20 MG tablet Take 1 tablet (20 mg total) by mouth at bedtime. 90 tablet 3  . metFORMIN (GLUCOPHAGE) 1000 MG tablet TAKE 1 TABLET (1,000 MG TOTAL) BY MOUTH 2 (TWO) TIMES DAILY WITH A MEAL. 180 tablet 1  . Multiple Vitamin (MULTIVITAMIN WITH MINERALS) TABS tablet Take 1 tablet by mouth daily.    . Naproxen Sodium (ALEVE PO) Take 1 tablet by mouth as needed. Pt uses as needed, but does not use when uses Tylenol    . omeprazole (PRILOSEC) 20 MG capsule Take 2 capsules (40 mg total) by mouth 2 (two) times daily. 360 capsule 3  . rifaximin (XIFAXAN) 550 MG TABS tablet Take 1 tablet (550 mg total) by mouth 3 (three) times daily. 42 tablet 0   No current facility-administered medications for this visit.   Allergies  Allergen Reactions  . Codeine     Makes her crazy  . Propoxyphene Hcl Itching      Review of Systems: Gen: Denies any fever, chills, sweats, anorexia, fatigue, weakness, malaise, weight  loss, and sleep disorder CV: Denies chest pain, angina, palpitations, syncope, orthopnea, PND, peripheral edema, and claudication. Resp: Denies dyspnea at rest, dyspnea with exercise, cough, sputum, wheezing, coughing up blood, and pleurisy. GI: Denies vomiting blood, jaundice, and fecal incontinence.   Denies dysphagia or odynophagia. GU : Denies urinary burning, blood in urine, urinary frequency, urinary hesitancy, nocturnal urination, and urinary incontinence. MS: Denies joint pain, limitation  of movement, and swelling, stiffness, low back pain, extremity pain. Denies muscle weakness, cramps, atrophy.  Derm: Denies rash, itching, dry skin, hives, moles, warts, or unhealing ulcers.  Psych: Denies depression, anxiety, memory loss, suicidal ideation, hallucinations, paranoia, and confusion. Heme: Denies bruising, bleeding, and enlarged lymph nodes. Neuro:  Denies any headaches, dizziness, paresthesia Endo:  Denies any problems with DM, thyroid, adrenal  LAB RESULTS: Blood work on 10/21/2013 had a white blood cell count 11.4, hemoglobin 12, hematocrit 37.1, platelets 408,000. Comprehensive metabolic panel on 38/10/1749 had AST 19, ALT 16, alkaline phosphatase 61, total bili 0.3. Lipase 16.  Studies:  CT Abdomen Pelvis Wo Contrast     Study Result     CLINICAL DATA: Epigastric pain which began 24 hr prior. Sharp pain.  EXAM: CT ABDOMEN AND PELVIS WITHOUT CONTRAST  TECHNIQUE: Multidetector CT imaging of the abdomen and pelvis was performed following the standard protocol without IV contrast.  COMPARISON: CT 01/05/2011  FINDINGS: Lower chest: Mild left basilar atelectasis. No pericardial fluid.  Hepatobiliary: No focal hepatic lesions non contrast exam. Mild fatty infiltration along the falciform ligament. Gallbladder is normal.  Pancreas: Pancreas is normal. No duct dilatation. No pancreatic inflammation.  Spleen: Normal spleen  Adrenals/urinary tract: Adrenal  glands kidneys are normal. No ureteral obstruction. Normal bladder.  Stomach/Bowel: Stomach, small bowel, cecum are normal. Colon rectosigmoid colon are normal.  Vascular/Lymphatic: Abdominal aorta is normal caliber. There is no retroperitoneal or periportal lymphadenopathy. No pelvic lymphadenopathy.  Reproductive: Post hysterectomy.  Musculoskeletal: Chronic anterolisthesis of L5 on S1 unchanged. Bilateral pars defects at this level.  Other: No free fluid the abdomen pelvis. No ventral or inguinal hernia.  IMPRESSION: 1. No evidence of hernia. 2. No acute findings in the abdomen or pelvis. 3. 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   Electronically Signed  By: Suzy Bouchard M.D.  On: 10/21/2013 22:49   Procedures: Colonoscopy 12/31/2012 was a normal colonoscopy and she was advised to have surveillance in 5 years. EGD on 12/04/2007 was a normal examination diagnosis GERD.  Physical Exam: General: Pleasant, well developed female in no acute distress Head: Normocephalic and atraumatic Eyes:  sclerae anicteric, conjunctiva pink  Ears: Normal auditory acuity Lungs: Clear throughout to auscultation Heart: Regular rate and rhythm Abdomen: Soft, non distended, tender epigastric area, no rebound or guarding,No masses, no hepatomegaly. Normal bowel sounds Musculoskeletal: Symmetrical with no gross deformities  Extremities: No edema  Neurological: Alert oriented x 4, grossly nonfocal Psychological:  Alert and cooperative. Normal mood and affect  Assessment and Recommendations: #1. Epigastric pain and GERD. She has had no relief with twice a day PPI therapy and antireflux regimen. She is quite distressed over her discomfort. She will be scheduled for an EGD to evaluate for esophagitis, gastritis, ulcer.The risks, benefits, and alternatives to endoscopy with possible biopsy and possible dilation were discussed with the patient and they  consent to proceed. The procedure will be scheduled with Dr. Fuller Plan.  #2. IBS. She has had an exacerbation of her symptoms with stools now alternating between days of formed stools and days of loose stools with excessive gas, bloating ,and flatulence. She will be given a trial of Xifaxan 550 mg 3 times a day for 14 days.  Further recommendations will be made pending the findings of her EGD.       Ilhan Debenedetto, Deloris Ping 01/23/2014,

## 2014-01-23 NOTE — Progress Notes (Signed)
Reviewed and agree with management plan.  Mykenzi Vanzile T. Iraida Cragin, MD FACG 

## 2014-01-23 NOTE — Telephone Encounter (Signed)
-----   Message from [employer], PA-C sent at 01/23/2014  4:14 PM EST ----- Pts BP was elevated at visit. May have been due to her discomfort, but can you instruct her to recheck it at her PCP office within the next few days?

## 2014-01-24 DIAGNOSIS — H43811 Vitreous degeneration, right eye: Secondary | ICD-10-CM | POA: Diagnosis not present

## 2014-01-24 DIAGNOSIS — E1142 Type 2 diabetes mellitus with diabetic polyneuropathy: Secondary | ICD-10-CM | POA: Diagnosis not present

## 2014-01-24 DIAGNOSIS — E119 Type 2 diabetes mellitus without complications: Secondary | ICD-10-CM | POA: Diagnosis not present

## 2014-01-24 DIAGNOSIS — L97411 Non-pressure chronic ulcer of right heel and midfoot limited to breakdown of skin: Secondary | ICD-10-CM | POA: Diagnosis not present

## 2014-01-24 DIAGNOSIS — H40003 Preglaucoma, unspecified, bilateral: Secondary | ICD-10-CM | POA: Diagnosis not present

## 2014-01-24 LAB — HM DIABETES EYE EXAM

## 2014-01-27 ENCOUNTER — Encounter: Payer: Self-pay | Admitting: Family Medicine

## 2014-01-27 ENCOUNTER — Ambulatory Visit: Payer: Medicare Other | Admitting: Family Medicine

## 2014-01-27 ENCOUNTER — Telehealth: Payer: Self-pay | Admitting: Physician Assistant

## 2014-01-27 NOTE — Telephone Encounter (Signed)
Patient states she bought herself a BP cuff and will check her BP. She has an OV with Dr. Maudie Mercury next week.

## 2014-01-30 ENCOUNTER — Telehealth: Payer: Self-pay | Admitting: Gastroenterology

## 2014-01-30 MED ORDER — METRONIDAZOLE 250 MG PO TABS: 250.0000 mg | ORAL_TABLET | Freq: Three times a day (TID) | ORAL | Status: DC

## 2014-01-30 NOTE — Telephone Encounter (Signed)
Can try flagyl 250 tid x 10 days. No ETOH while on flagyl.

## 2014-01-30 NOTE — Telephone Encounter (Signed)
New rx sent to pharmacy. Patient notified that rx has been sent.

## 2014-01-31 ENCOUNTER — Ambulatory Visit: Payer: Medicare Other | Admitting: Family Medicine

## 2014-01-31 ENCOUNTER — Encounter: Payer: Medicare Other | Admitting: Gastroenterology

## 2014-02-05 ENCOUNTER — Encounter: Payer: Self-pay | Admitting: Internal Medicine

## 2014-02-05 ENCOUNTER — Encounter: Payer: Self-pay | Admitting: Family Medicine

## 2014-02-14 ENCOUNTER — Ambulatory Visit: Payer: Medicare Other | Admitting: Family Medicine

## 2014-02-14 ENCOUNTER — Ambulatory Visit (AMBULATORY_SURGERY_CENTER): Payer: Medicare Other | Admitting: Gastroenterology

## 2014-02-14 ENCOUNTER — Encounter: Payer: Self-pay | Admitting: Gastroenterology

## 2014-02-14 VITALS — BP 144/85 | HR 91 | Temp 98.0°F | Resp 19 | Ht 65.0 in | Wt 206.0 lb

## 2014-02-14 DIAGNOSIS — I10 Essential (primary) hypertension: Secondary | ICD-10-CM | POA: Diagnosis not present

## 2014-02-14 DIAGNOSIS — D649 Anemia, unspecified: Secondary | ICD-10-CM | POA: Diagnosis not present

## 2014-02-14 DIAGNOSIS — K219 Gastro-esophageal reflux disease without esophagitis: Secondary | ICD-10-CM | POA: Diagnosis not present

## 2014-02-14 DIAGNOSIS — E119 Type 2 diabetes mellitus without complications: Secondary | ICD-10-CM | POA: Diagnosis not present

## 2014-02-14 DIAGNOSIS — K2210 Ulcer of esophagus without bleeding: Secondary | ICD-10-CM | POA: Diagnosis not present

## 2014-02-14 DIAGNOSIS — K589 Irritable bowel syndrome without diarrhea: Secondary | ICD-10-CM | POA: Diagnosis not present

## 2014-02-14 DIAGNOSIS — R1013 Epigastric pain: Secondary | ICD-10-CM | POA: Diagnosis not present

## 2014-02-14 LAB — GLUCOSE, CAPILLARY
Glucose-Capillary: 150 mg/dL — ABNORMAL HIGH (ref 70–99)
Glucose-Capillary: 139 mg/dL — ABNORMAL HIGH (ref 70–99)

## 2014-02-14 MED ORDER — SODIUM CHLORIDE 0.9 % IV SOLN: 500.0000 mL | INTRAVENOUS | Status: DC

## 2014-02-14 NOTE — Progress Notes (Signed)
No problems noted in the recovery room. maw 

## 2014-02-14 NOTE — Patient Instructions (Signed)
YOU HAD AN ENDOSCOPIC PROCEDURE TODAY AT THE [facility]: Refer to the procedure report that was given to you for any specific questions about what was found during the examination.  If the procedure report does not answer your questions, please call your gastroenterologist to clarify.  If you requested that your care partner not be given the details of your procedure findings, then the procedure report has been included in a sealed envelope for you to review at your convenience later.  YOU SHOULD EXPECT: Some feelings of bloating in the abdomen. Passage of more gas than usual.  Walking can help get rid of the air that was put into your GI tract during the procedure and reduce the bloating. If you had a lower endoscopy (such as a colonoscopy or flexible sigmoidoscopy) you may notice spotting of blood in your stool or on the toilet paper. If you underwent a bowel prep for your procedure, then you may not have a normal bowel movement for a few days.  DIET: Your first meal following the procedure should be a light meal and then it is ok to progress to your normal diet.  A half-sandwich or bowl of soup is an example of a good first meal.  Heavy or fried foods are harder to digest and may make you feel nauseous or bloated.  Likewise meals heavy in dairy and vegetables can cause extra gas to form and this can also increase the bloating.  Drink plenty of fluids but you should avoid alcoholic beverages for 24 hours.  ACTIVITY: Your care partner should take you home directly after the procedure.  You should plan to take it easy, moving slowly for the rest of the day.  You can resume normal activity the day after the procedure however you should NOT DRIVE or use heavy machinery for 24 hours (because of the sedation medicines used during the test).    SYMPTOMS TO REPORT IMMEDIATELY: A gastroenterologist can be reached at any hour.  During normal business hours, 8:30 AM to 5:00 PM Monday through Friday,  call [phone].  After hours and on weekends, please call the GI answering service at [phone] who will take a message and have the physician on call contact you.   Following upper endoscopy (EGD)  Vomiting of blood or coffee ground material  New chest pain or pain under the shoulder blades  Painful or persistently difficult swallowing  New shortness of breath  Fever of 100F or higher  Black, tarry-looking stools  FOLLOW UP: If any biopsies were taken you will be contacted by phone or by letter within the next 1-3 weeks.  Call your gastroenterologist if you have not heard about the biopsies in 3 weeks.  Our staff will call the home number listed on your records the next business day following your procedure to check on you and address any questions or concerns that you may have at that time regarding the information given to you following your procedure. This is a courtesy call and so if there is no answer at the home number and we have not heard from you through the emergency physician on call, we will assume that you have returned to your regular daily activities without incident.  SIGNATURES/CONFIDENTIALITY: You and/or your care partner have signed paperwork which will be entered into your electronic medical record.  These signatures attest to the fact that that the information above on your After Visit Summary has been reviewed and is understood.  Full responsibility   of the confidentiality of this discharge information lies with you and/or your care-partner.    Handouts were given to your care partner on GERD. You might notice some irritation in your nose or drainage.  This may cause feelings of congestion.  This is from the oxygen, which can be drying.  This is no cause for concern; this should clear up in a few days.  You may resume your current medications today.  Continue taking your acid reducing medication twice per day long term per Dr. Fuller Plan.  Please call if any  questions or concerns.

## 2014-02-14 NOTE — Progress Notes (Signed)
Stable to RR 

## 2014-02-14 NOTE — Op Note (Signed)
[facility]  [address] [place], [id number]   ENDOSCOPY PROCEDURE REPORT  [name], Beth Holden  MR#: [id number] BIRTHDATE: Sep 29, 1945 , 35  yrs. old GENDER: female ENDOSCOPIST: Ladene Artist, MD, [facility] PROCEDURE DATE:  02/14/2014 PROCEDURE:  EGD, diagnostic ASA CLASS:     Class II INDICATIONS:  epigastric pain and history of esophageal reflux. MEDICATIONS: Monitored anesthesia care, Propofol 150 mg IV, and lidcaine 40 mg IV TOPICAL ANESTHETIC: none DESCRIPTION OF PROCEDURE: After the risks benefits and alternatives of the procedure were thoroughly explained, informed consent was obtained.  The LB [id number] endoscope was introduced through the mouth and advanced to the second portion of the duodenum , Without limitations.  The instrument was slowly withdrawn as the mucosa was fully examined.    ESOPHAGUS: The mucosa of the esophagus appeared normal. STOMACH: The mucosa of the stomach appeared normal. DUODENUM: The duodenal mucosa showed no abnormalities.  Retroflexed views revealed no abnormalities.  The scope was then withdrawn from the patient and the procedure completed.  COMPLICATIONS: There were no immediate complications.  ENDOSCOPIC IMPRESSION: 1.   The EGD appeared normal  RECOMMENDATIONS: 1.  Anti-reflux regimen long term 2.  Continue PPI bid long term 3.  No GI cause for epigastric pain found, suspect musculoskeletal pain. OP follow-up is advised on a PRN basis. Follow up with you PCP as planned.  eSigned:  Ladene Artist, MD, [facility] 02/14/2014 3:41 PM   CC: Colin Benton, DO

## 2014-02-16 ENCOUNTER — Other Ambulatory Visit: Payer: Self-pay | Admitting: Internal Medicine

## 2014-02-17 ENCOUNTER — Telehealth: Payer: Self-pay

## 2014-02-17 NOTE — Telephone Encounter (Signed)
  Follow up Call-  Call back number 02/14/2014 12/31/2012  Post procedure Call Back phone  # [phone]  Permission to leave phone message - Yes     Patient questions:  Do you have a fever, pain , or abdominal swelling? No. Pain Score  0 *  Have you tolerated food without any problems? Yes.    Have you been able to return to your normal activities? Yes.    Do you have any questions about your discharge instructions: Diet   No. Medications  No. Follow up visit  No.  Do you have questions or concerns about your Care? No.  Actions: * If pain score is 4 or above: No action needed, pain <4.  No problems per the pt. maw

## 2014-02-17 NOTE — Telephone Encounter (Signed)
Needs appt for further refills.

## 2014-02-19 DIAGNOSIS — L97411 Non-pressure chronic ulcer of right heel and midfoot limited to breakdown of skin: Secondary | ICD-10-CM | POA: Diagnosis not present

## 2014-02-19 DIAGNOSIS — E1142 Type 2 diabetes mellitus with diabetic polyneuropathy: Secondary | ICD-10-CM | POA: Diagnosis not present

## 2014-02-28 ENCOUNTER — Ambulatory Visit (INDEPENDENT_AMBULATORY_CARE_PROVIDER_SITE_OTHER): Payer: Medicare Other | Admitting: Family Medicine

## 2014-02-28 DIAGNOSIS — K219 Gastro-esophageal reflux disease without esophagitis: Secondary | ICD-10-CM

## 2014-02-28 DIAGNOSIS — E1159 Type 2 diabetes mellitus with other circulatory complications: Secondary | ICD-10-CM

## 2014-02-28 DIAGNOSIS — E785 Hyperlipidemia, unspecified: Secondary | ICD-10-CM | POA: Diagnosis not present

## 2014-02-28 DIAGNOSIS — I10 Essential (primary) hypertension: Secondary | ICD-10-CM

## 2014-02-28 DIAGNOSIS — Z89429 Acquired absence of other toe(s), unspecified side: Secondary | ICD-10-CM | POA: Diagnosis not present

## 2014-02-28 DIAGNOSIS — G629 Polyneuropathy, unspecified: Secondary | ICD-10-CM

## 2014-02-28 DIAGNOSIS — K589 Irritable bowel syndrome without diarrhea: Secondary | ICD-10-CM | POA: Diagnosis not present

## 2014-02-28 LAB — BASIC METABOLIC PANEL
BUN: 17 mg/dL (ref 6–23)
CO2: 30 mEq/L (ref 19–32)
Calcium: 10.3 mg/dL (ref 8.4–10.5)
Chloride: 103 mEq/L (ref 96–112)
Creatinine, Ser: 0.8 mg/dL (ref 0.40–1.20)
GFR: 76.18 mL/min (ref >60.00)
Glucose, Bld: 171 mg/dL — ABNORMAL HIGH (ref 70–99)
Potassium: 5.2 mEq/L — ABNORMAL HIGH (ref 3.5–5.1)
Sodium: 141 mEq/L (ref 135–145)

## 2014-02-28 LAB — HEMOGLOBIN A1C: Hgb A1c MFr Bld: 8.7 % — ABNORMAL HIGH (ref 4.6–6.5)

## 2014-02-28 NOTE — Patient Instructions (Signed)
BEFORE YOU LEAVE: -labs -schedule follow up in 3-4 months  Schedule follow up with your diabetes doctor and ensure you are checking your sugars to take log to your diabetes appointment  Let us know if you want to do you bone density test  We recommend the following healthy lifestyle measures: - eat a healthy diet consisting of lots of vegetables, fruits, beans, nuts, seeds, healthy meats such as white chicken and fish and whole grains.  - avoid fried foods, fast food, processed foods, sodas, red meet and other fattening foods.  - get a least 150 minutes of aerobic exercise per week. Please try chair exercises for now.

## 2014-02-28 NOTE — Progress Notes (Signed)
HPI:  Follow up:  HM: Dexa: reports sees Dr. Nori Riis, declined referral for this Influenza - refused hgba1c - refused, then agreed to do  DM: -managed by her endocrinologist -poor compliance with follow up and recs -seeing podiatrist for foot ulcers   HTN/HLD: -meds: losartan 25, lovastatin 20 -denies: HA, SOB, DOE -we discussed using a diuretic for intermittent LE edema, she reports edema has been better  Epigastric pain/GERD: -has had extensive eval with GI, echo, CT -on PPI -reports:  ROS: See pertinent positives and negatives per HPI.  Past Medical History  Diagnosis Date  . Erosive esophagitis   . Peripheral neuropathy   . IBS (irritable bowel syndrome)   . Hyperlipemia     takes Crestor daily  . ALOPECIA NEC   . Seasonal allergies     takes Zyrtec daily  . Headache(784.0)     occasionally;r/t sinus   . Joint pain   . History of colon polyps   . Diabetes mellitus type II     takes Metformin and Flexpen daily  . Hypertension     takes Enalapril daily  . GERD (gastroesophageal reflux disease)     takes Nexium bid  . Osteomyelitis   . Insomnia     takes Elavil nightly and as well as neuropathy  . Peripheral neuropathy   . Diverticulosis   . Anemia, mild     Past Surgical History  Procedure Laterality Date  . Vaginal hysterectomy  2001  . Oophorectomy  2001  . Tubal ligation    . Rotator cuff repair Right   . Laparoscopic appendectomy  01/05/2011    Procedure: APPENDECTOMY LAPAROSCOPIC;  Surgeon: Pedro Earls, MD;  Location: [facility];  Service: General;  Laterality: N/A;  . Colonoscopy    . Amputation  12/28/2011    Procedure: AMPUTATION DIGIT;  Surgeon: Newt Minion, MD;  Location: [facility];  Service: Orthopedics;  Laterality: Right;  Right Foot 2nd Toe Amputation at MTP (metatarsophalangeal joint)  . Amputation Right 04/25/2012    Procedure: Right Foot 3rd Toe Amputation;  Surgeon: Newt Minion, MD;  Location: [facility];  Service: Orthopedics;   Laterality: Right;  Right Foot Third Toe Amputation   . Amputation Right 07/27/2012    Procedure: Right 4th Toe Amputation at Metatarsophalangeal;  Surgeon: Newt Minion, MD;  Location: [facility];  Service: Orthopedics;  Laterality: Right;  Right 4th Toe Amputation at Metatarsophalangeal    Family History  Problem Relation Age of Onset  . Lung cancer Mother 85  . Hypertension Father   . Hyperlipidemia Father   . Diabetes Father   . Coronary artery disease Father   . Dementia Father   . Stomach cancer Paternal Aunt   . Brain cancer Paternal Uncle   . Colon cancer Neg Hx   . Irritable bowel syndrome      Several family members on fathers side   . Diabetes Other   . Stomach cancer Maternal Aunt     History   Social History  . Marital Status: Married    Spouse Name: N/A  . Number of Children: 2  . Years of Education: N/A   Occupational History  . Retired     Social History Main Topics  . Smoking status: Never Smoker   . Smokeless tobacco: Never Used  . Alcohol Use: No  . Drug Use: No  . Sexual Activity: Yes    Birth Control/ Protection: Surgical   Other Topics Concern  . Not on  file   Social History Narrative   Caffeine daily    [employer], [employer] no diploma   Married '66   1 dtr- '78; 1 son '71; 2 grandchildren   Occupation: retired 04   Dad with alzheimers-had to place in [place] (summer '10)           Current outpatient prescriptions:  .  acetaminophen (TYLENOL) 500 MG tablet, Take 500 mg by mouth every 6 (six) hours as needed for mild pain. , Disp: , Rfl:  .  amitriptyline (ELAVIL) 25 MG tablet, Take 25 mg by mouth at bedtime., Disp: , Rfl:  .  cetirizine (ZYRTEC) 10 MG tablet, Take 10 mg by mouth daily. , Disp: , Rfl:  .  doxycycline (VIBRAMYCIN) 100 MG capsule, Take 100 mg by mouth 2 (two) times daily. , Disp: , Rfl: 0 .  Fluocinolone Acetonide (DERMOTIC) 0.01 % OIL, Place 1 drop in ear(s) daily as needed (itching). , Disp: , Rfl:  .  gabapentin (NEURONTIN) 300 MG  capsule, Take 300 mg by mouth at bedtime., Disp: , Rfl:  .  glucose blood (ONE TOUCH ULTRA TEST) test strip, Use to test blood sugar 3 times daily as instructed. Dx code: E11.49., Disp: 300 each, Rfl: 5 .  insulin NPH Human (HUMULIN N,NOVOLIN N) 100 UNIT/ML injection, Inject 17-20 Units into the skin 2 (two) times daily before a meal., Disp: , Rfl:  .  insulin regular (NOVOLIN R,HUMULIN R) 100 units/mL injection, Inject 6-10 Units into the skin 3 (three) times daily before meals. Sliding scale, Disp: , Rfl:  .  Insulin Syringe-Needle U-100 31G X 5/16" 1 ML MISC, Use 2x a day, Disp: 100 each, Rfl: 11 .  losartan (COZAAR) 25 MG tablet, Take 1 tablet (25 mg total) by mouth daily., Disp: 90 tablet, Rfl: 1 .  lovastatin (MEVACOR) 20 MG tablet, Take 1 tablet (20 mg total) by mouth at bedtime., Disp: 90 tablet, Rfl: 3 .  metFORMIN (GLUCOPHAGE) 1000 MG tablet, TAKE 1 TABLET (1,000 MG TOTAL) BY MOUTH 2 (TWO) TIMES DAILY WITH A MEAL., Disp: 180 tablet, Rfl: 1 .  metroNIDAZOLE (FLAGYL) 250 MG tablet, Take 1 tablet (250 mg total) by mouth 3 (three) times daily., Disp: 30 tablet, Rfl: 0 .  Multiple Vitamin (MULTIVITAMIN WITH MINERALS) TABS tablet, Take 1 tablet by mouth daily., Disp: , Rfl:  .  Naproxen Sodium (ALEVE PO), Take 1 tablet by mouth as needed. Pt uses as needed, but does not use when uses Tylenol, Disp: , Rfl:  .  NOVOLIN N RELION 100 UNIT/ML injection, INJECT 17 UNITS UNDER THE SKIN 2 TIMES A DAY AS ADVISED, Disp: 20 mL, Rfl: 0 .  omeprazole (PRILOSEC) 20 MG capsule, Take 2 capsules (40 mg total) by mouth 2 (two) times daily., Disp: 360 capsule, Rfl: 3  EXAM:  Filed Vitals:   02/28/14 1004  BP: 122/80  Pulse: 101  Temp: 97.9 F (36.6 C)    Body mass index is 0.00 kg/(m^2).  GENERAL: vitals reviewed and listed above, alert, oriented, appears well hydrated and in no acute distress  HEENT: atraumatic, conjunttiva clear, no obvious abnormalities on inspection of external nose and  ears  NECK: no obvious masses on inspection  LUNGS: clear to auscultation bilaterally, no wheezes, rales or rhonchi, good air movement  CV: HRRR, no peripheral edema  MS: moves all extremities without noticeable abnormality  PSYCH: pleasant and cooperative, no obvious depression or anxiety  ASSESSMENT AND PLAN:  Discussed the following assessment and plan:  Type  2 diabetes mellitus with other circulatory complications - Plan: Hemoglobin A1c  Peripheral neuropathy  Hyperlipidemia  Essential hypertension - Plan: Basic metabolic panel  Gastroesophageal reflux disease, esophagitis presence not specified  IRRITABLE BOWEL SYNDROME - followed by Dr. Fuller Plan  S/P amputation of lesser toe, unspecified laterality  -counseled on lifestyle extensively  -she initially refused labs and wanted to do with endocrinologist, convinced her to do bmp and hga1c today and fasting lipids next appt - she would like me to forward to Dr. Renne Crigler - advised she schedule endo follow up visit -Patient advised to return or notify a doctor immediately if symptoms worsen or persist or new concerns arise.  Patient Instructions  BEFORE YOU LEAVE: -labs -schedule follow up in 3-4 months  Schedule follow up with your diabetes doctor and ensure you are checking your sugars to take log to your diabetes appointment  Let us know if you want to do you bone density test  We recommend the following healthy lifestyle measures: - eat a healthy diet consisting of lots of vegetables, fruits, beans, nuts, seeds, healthy meats such as white chicken and fish and whole grains.  - avoid fried foods, fast food, processed foods, sodas, red meet and other fattening foods.  - get a least 150 minutes of aerobic exercise per week. Please try chair exercises for now.        Colin Benton R.

## 2014-02-28 NOTE — Progress Notes (Signed)
Pre visit review using our clinic review tool, if applicable. No additional management support is needed unless otherwise documented below in the visit note. 

## 2014-03-05 ENCOUNTER — Ambulatory Visit (INDEPENDENT_AMBULATORY_CARE_PROVIDER_SITE_OTHER): Payer: Medicare Other | Admitting: Internal Medicine

## 2014-03-05 ENCOUNTER — Encounter: Payer: Self-pay | Admitting: Internal Medicine

## 2014-03-05 ENCOUNTER — Ambulatory Visit (INDEPENDENT_AMBULATORY_CARE_PROVIDER_SITE_OTHER)
Admission: RE | Admit: 2014-03-05 | Discharge: 2014-03-05 | Disposition: A | Discharge status: Home / Self Care | Payer: Medicare Other | Source: Ambulatory Visit | Attending: Internal Medicine | Admitting: Internal Medicine

## 2014-03-05 VITALS — BP 130/70 | HR 98 | Ht 65.5 in | Wt 208.0 lb

## 2014-03-05 DIAGNOSIS — R0789 Other chest pain: Secondary | ICD-10-CM

## 2014-03-05 DIAGNOSIS — R058 Other specified cough: Secondary | ICD-10-CM

## 2014-03-05 DIAGNOSIS — R079 Chest pain, unspecified: Secondary | ICD-10-CM | POA: Diagnosis not present

## 2014-03-05 DIAGNOSIS — R05 Cough: Secondary | ICD-10-CM

## 2014-03-05 DIAGNOSIS — I10 Essential (primary) hypertension: Secondary | ICD-10-CM | POA: Diagnosis not present

## 2014-03-05 MED ORDER — FAMOTIDINE 20 MG PO TABS: ORAL_TABLET | ORAL | Status: DC

## 2014-03-05 MED ORDER — BENZONATATE 200 MG PO CAPS: ORAL_CAPSULE | ORAL | Status: DC

## 2014-03-05 NOTE — Progress Notes (Signed)
Subjective:    Patient ID: Beth Holden, female    DOB: 08-12-1945,    MRN: [id number]  HPI  38 yowf never smoker with runny nose all her life with w/u by Allergy in early 2000's by Dr Lorin Picket and some better on zyrtec/ no shots but did not fix the drippy nose then cough ing in October 2015 while on ACEi assoc with bilateral ant rib pain and some better since stopped the acei but never resolved so referred to pulmonary clinic 03/05/2014   by Dr Maudie Mercury.   03/05/2014 1st [facility] office visit/ Wert   Chief Complaint  Patient presents with  . Pulmonary Consult    Referred by Dr Colin Benton. Pt c/o cough on and off since Oct 2015. She also has pain under breasts- worse with coughing and worse on the right side. Her cough is non prod.   after first  Episode of bad cough in Oct 2015  stopped enalapril and resolved then came early Dec 2015 not as bad and each time it comes back hurts in same area = R > L ant chest below her breasts  and resolved s change in rx p second episode  then recurred 03/01/14.  Cough is dry, more day > noct assoc with her typical chronic sinus issues but does not correlate as these are daily year round.  No obvious other patterns in day to day or daytime variabilty or assoc chronic cough or cp or chest tightness, subjective wheeze overt sinus or hb symptoms. No unusual exp hx or h/o childhood pna/ asthma or knowledge of premature birth.  Sleeping ok without nocturnal  or early am exacerbation  of respiratory  c/o's or need for noct saba. Also denies any obvious fluctuation of symptoms with weather or environmental changes or other aggravating or alleviating factors except as outlined above   Current Medications, Allergies, Complete Past Medical History, Past Surgical History, Family History, and Social History were reviewed in [facility] record.              Review of Systems  Constitutional: Negative for fever, chills and unexpected  weight change.  HENT: Positive for ear pain, sinus pressure and sore throat. Negative for congestion, dental problem, nosebleeds, postnasal drip, rhinorrhea, sneezing, trouble swallowing and voice change.   Eyes: Negative for visual disturbance.  Respiratory: Positive for cough. Negative for choking and shortness of breath.   Cardiovascular: Positive for leg swelling. Negative for chest pain.  Gastrointestinal: Negative for vomiting, abdominal pain and diarrhea.  Genitourinary: Negative for difficulty urinating.       Acid heartburn  Musculoskeletal: Negative for arthralgias.  Skin: Negative for rash.  Neurological: Negative for tremors, syncope and headaches.  Hematological: Does not bruise/bleed easily.       Objective:   Physical Exam  amb wf nad very somber, somewhat of a hopeless affect   Wt Readings from Last 3 Encounters:  03/05/14 208 lb (94.348 kg)  02/14/14 206 lb (93.441 kg)  01/23/14 206 lb (93.441 kg)    Vital signs reviewed   HEENT: nl dentition, turbinates, and orophanx. Nl external ear canals without cough reflex   NECK :  without JVD/Nodes/TM/ nl carotid upstrokes bilaterally   LUNGS: no acc muscle use, clear to A and P bilaterally without cough on insp or exp maneuvers   CV:  RRR  no s3 or murmur or increase in P2, no edema   ABD:  soft and nontender with nl excursion in  the supine position. No bruits or organomegaly, bowel sounds nl  MS:  warm without deformities, calf tenderness, cyanosis or clubbing  SKIN: warm and dry without lesions    NEURO:  alert, approp, no deficits    I personally reviewed images and agree with radiology impression as follows:  CXR:  10/21/13 No acute disease.            Assessment & Plan:

## 2014-03-05 NOTE — Patient Instructions (Addendum)
Omeprazole 20 mg x 2 Take 30- 60 min before your first and last meals of the day and Pepcid ac 20 mg at bedtime  For drainage >> chlortrimeton (chlorpheniramine) 4 mg every 4 hours available over the counter (may cause drowsiness)  GERD (REFLUX)  is an extremely common cause of respiratory symptoms just like yours , many times with no obvious heartburn at all.    It can be treated with medication, but also with lifestyle changes including avoidance of late meals, excessive alcohol, smoking cessation, and avoid fatty foods, chocolate, peppermint, colas, red wine, and acidic juices such as orange juice.  NO MINT OR MENTHOL PRODUCTS SO NO COUGH DROPS  USE SUGARLESS CANDY INSTEAD (Jolley ranchers or Stover's or Life Savers) or even ice chips will also do - the key is to swallow to prevent all throat clearing. NO OIL BASED VITAMINS - use powdered substitutes.  For cough > tessalon 200 mg one every 4 hours as needed   Please remember to go to the x-ray department downstairs for your tests - we will call you with the results when they are available.    Please schedule a follow up office visit in 2 weeks, sooner if needed

## 2014-03-06 ENCOUNTER — Encounter: Payer: Self-pay | Admitting: Internal Medicine

## 2014-03-06 DIAGNOSIS — R0789 Other chest pain: Secondary | ICD-10-CM | POA: Insufficient documentation

## 2014-03-06 NOTE — Progress Notes (Signed)
Quick Note:  Spoke with pt and notified of results per Dr. Wert. Pt verbalized understanding and denied any questions.  ______ 

## 2014-03-06 NOTE — Assessment & Plan Note (Signed)
ACEi changed to generic cozar 10/2013 > improved   For reasons that may related to vascular permability and nitric oxide pathways but not elevated  bradykinin levels (as seen with  ACEi use) losartan in the generic form has been reported now from mulitple sources  to cause a similar pattern of non-specific  upper airway symptoms as seen with acei.   This has not been reported with exposure to the other ARB's to date, so it seems reasonable for now to try either generic diovan or avapro if ARB needed or use an alternative class altogether.  See:  Beth Holden Allergy Asthma Immunol  2008: 101: p [phone]    Strongly prefer in this setting: Bystolic, the most beta -1  selective Beta blocker available in sample form, with bisoprolol the most selective generic choice  on the market should the cough continue or use other arb like diovan or avapro which have not been yet assoc with sign cough incidence.

## 2014-03-06 NOTE — Assessment & Plan Note (Signed)
Classic bilateral mscp from coughing, will treat the cough first and see if this needs additional eval afterwards

## 2014-03-06 NOTE — Assessment & Plan Note (Signed)
The most common causes of chronic cough in immunocompetent adults include the following: upper airway cough syndrome (UACS), previously referred to as postnasal drip syndrome (PNDS), which is caused by variety of rhinosinus conditions; (2) asthma; (3) GERD; (4) chronic bronchitis from cigarette smoking or other inhaled environmental irritants; (5) nonasthmatic eosinophilic bronchitis; and (6) bronchiectasis.   These conditions, singly or in combination, have accounted for up to 94% of the causes of chronic cough in prospective studies.   Other conditions have constituted no >6% of the causes in prospective studies These have included bronchogenic carcinoma, chronic interstitial pneumonia, sarcoidosis, left ventricular failure, ACEI-induced cough, and aspiration from a condition associated with pharyngeal dysfunction.    Chronic cough is often simultaneously caused by more than one condition. A single cause has been found from 38 to 82% of the time, multiple causes from 18 to 62%. Multiply caused cough has been the result of three diseases up to 42% of the time.       Based on hx and exam, this is most likely:  Classic Upper airway cough syndrome, so named because it's frequently impossible to sort out how much is  CR/sinusitis with freq throat clearing (which can be related to primary GERD)   vs  causing  secondary (" extra esophageal")  GERD from wide swings in gastric pressure that occur with throat clearing, often  promoting self use of mint and menthol lozenges that reduce the lower esophageal sphincter tone and exacerbate the problem further in a cyclical fashion.   These are the same pts (now being labeled as having "irritable larynx syndrome" by some cough centers) who not infrequently have a history of having failed to tolerate ace inhibitors (and even now being reported with generic cozar) ,  dry powder inhalers or biphosphonates or report having atypical reflux symptoms that don't respond to  standard doses of PPI , and are easily confused as having aecopd or asthma flares by even experienced allergists/ pulmonologists.   The first step is to maximize acid suppression and eliminate pnds with 1st gen H1, cyclical coughing with tessilon then regroup if the cough persists.  See instructions for specific recommendations which were reviewed directly with the patient who was given a copy with highlighter outlining the key components.

## 2014-03-14 ENCOUNTER — Encounter: Payer: Self-pay | Admitting: Internal Medicine

## 2014-03-14 ENCOUNTER — Ambulatory Visit (INDEPENDENT_AMBULATORY_CARE_PROVIDER_SITE_OTHER): Payer: Medicare Other | Admitting: Internal Medicine

## 2014-03-14 VITALS — BP 126/70 | HR 108 | Temp 98.3°F | Resp 12 | Wt 205.0 lb

## 2014-03-14 DIAGNOSIS — IMO0002 Reserved for concepts with insufficient information to code with codable children: Secondary | ICD-10-CM

## 2014-03-14 DIAGNOSIS — E1142 Type 2 diabetes mellitus with diabetic polyneuropathy: Secondary | ICD-10-CM | POA: Diagnosis not present

## 2014-03-14 DIAGNOSIS — E1165 Type 2 diabetes mellitus with hyperglycemia: Principal | ICD-10-CM

## 2014-03-14 DIAGNOSIS — L97411 Non-pressure chronic ulcer of right heel and midfoot limited to breakdown of skin: Secondary | ICD-10-CM | POA: Diagnosis not present

## 2014-03-14 DIAGNOSIS — E1159 Type 2 diabetes mellitus with other circulatory complications: Secondary | ICD-10-CM

## 2014-03-14 DIAGNOSIS — E1151 Type 2 diabetes mellitus with diabetic peripheral angiopathy without gangrene: Secondary | ICD-10-CM

## 2014-03-14 DIAGNOSIS — B351 Tinea unguium: Secondary | ICD-10-CM | POA: Diagnosis not present

## 2014-03-14 MED ORDER — INSULIN REGULAR HUMAN 100 UNIT/ML IJ SOLN: 8.0000 [IU] | Freq: Three times a day (TID) | INTRAMUSCULAR | Status: DC

## 2014-03-14 NOTE — Progress Notes (Signed)
Subjective:     Patient ID: Beth Holden, female   DOB: Dec 16, 1945, 69 y.o.   MRN: [id number]  HPI Beth Holden is a pleasant 69 y.o. 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 Last visit 8 mo ago!  Had a callus on L foot >> infected >> now on Doxycycline and in boot.  Last HbA1C was: Lab Results  Component Value Date   HGBA1C 8.7* 02/28/2014   HGBA1C 7.3* 07/19/2013   HGBA1C 7.4* 04/15/2013   She was on the following meds, but she was in the doughnut hole >> we stopped in 04/2013: - Metformin 1000 mg bid - Levemir 47 units in am and 37 units in pm >> $800 for 3 mo supply - Januvia 100 mg daily - $450 for 3 mo - Invokana 100 mg - $455 for 3 mo We discussed about starting her on a VGo mechanical pump in the past. She had an appointment with diabetes education but decided not to pursue it.  She is now on: - Metformin 1000 mg bid - ReliOn N (long acting) and R (short acting) insulins as follows: Insulin Before breakfast Before lunch Before dinner  Regular  6 units with a small meal  8 units with a regular meal  10 units with a large meal x  6 units with a small meal  8 units with a regular meal  10 units with a large meal  NPH 20  x 20   She checks her sugars ~3x a day - no log/no meter: - am: 83-155 >> 71-197 >> 82-157 >> 96-170 (201) >> 83-140 (207) >> 90, 150-190, 230 - 2h after b'fast 141 >> 145-177 >> 122-185 >> n/c >> 141-183 (248) >> 131-180 (235 x1) >> 150-175 - prelunch: 80-124 >> 120-184 >> n/c >> 106, 157 >> 164 >> n/c - 2-3h after lunch: 150-200 >> 92-175 >> 48 x1 (skipped lunch) 91-101, 218 >> 200-250 - before dinner: 75-166 >> 94-151 >> n/c >> 116, 139 >> 57-136 >> 150-175 - after dinner: <200 >> 196 >> 208-318 >> 180-225 >> 123, 141, 240 >> 78-134 >> 200-275 - bedtime: 200s >> 159-232 >> 101-204 >> n/c >> 77, 193, 205  >> 75, 134, 230 >> n/c Lowest: 54 - (2h after she took insulin and did not eat!), 79.  Has hypoglycemia in the 70's.   Meals: - Breakfast: egg, bacon, toast; grits; biscuit; fruit; sometimes skips - Lunch: 1/2 PB sandwich or soup and yoghurt, sometimes grackers - Dinner: meat + vegetables + some starch - Snacks: fruit   - last eye exam in 01/24/2014(dr Rankin): no DR, no glaucoma  - No CKD. She is on Enalapril. BUN  Date Value Ref Range Status  02/28/2014 17 6 - 23 mg/dL Final   CREATININE, SER  Date Value Ref Range Status  02/28/2014 0.80 0.40 - 1.20 mg/dL Final  - Has numbness and tingling in feet - was on Gabapentin and Amitriptyline. - no hyperlipidemia: Lab Results  Component Value Date   CHOL 140 04/15/2013   HDL 57.20 04/15/2013   LDLCALC 68 04/15/2013   TRIG 72.0 04/15/2013   CHOLHDL 2 04/15/2013  She is on Mevacor.  Review of Systems Constitutional:+ weight gain, + fatigue, + poor sleep Eyes: + blurry vision, no xerophthalmia ENT: no sore throat, no nodules palpated in throat, no dysphagia/odynophagia, no hoarseness Cardiovascular: no CP/SOB/palpitations/leg swelling Respiratory: + cough/+ SOB  Gastrointestinal: no N/V/D/+ C/+ heartburn Musculoskeletal: no muscle/joint aches Skin: no rashes + low libido  I reviewed pt's medications, allergies, PMH, social hx, family hx, and changes were documented in the history of present illness. Otherwise, unchanged from my initial visit note.  Objective:   Physical Exam BP 126/70 mmHg  Pulse 108  Temp(Src) 98.3 F (36.8 C) (Oral)  Resp 12  Wt 205 lb (92.987 kg)  SpO2 95% Body mass index is 33.58 kg/(m^2). Wt Readings from Last 3 Encounters:  03/14/14 205 lb (92.987 kg)  03/05/14 208 lb (94.348 kg)  02/14/14 206 lb (93.441 kg)   Constitutional: overweight, in NAD Eyes: PERRLA, EOMI, no exophthalmos ENT: moist mucous membranes, no thyromegaly, no cervical lymphadenopathy Cardiovascular: tachycardia, RR, No  MRG Respiratory: CTA B Gastrointestinal: abdomen soft, NT, ND, BS+ Musculoskeletal: strength intact in all 4; missing right toes: 2,3,4; L foot in boot Skin: moist, warm, no rashes  Assessment:     1. DM2, uncontrolled, insulin-dependent, with complications - diabetic peripheral neuropathy - 3 toe amputations - after infected diabetic toe ulcers >> OM:  R 2nd toe amputated on 12/28/2011  R 3rd toe amputated on 04/25/2012  R 4th toe amputated on 07/27/2012    Plan:     1. DM2 Patient with long-standing diabetes, returning after a long absence. She developed worsened sugars - despite increasing back NPH. Will increase R insulin and add lunchtime R, too. - I advised her to:  Patient Instructions  Please change the ReliOn N (long acting) and R (short acting) insulins as follows: Insulin Before breakfast Before lunch Before dinner  Regular  8 units with a small meal  10 units with a regular meal  12 units with a large meal  8 units with a small meal  10 units with a regular meal  12 units with a large meal  12 units with a small meal  14 units with a regular meal  16 units with a large meal  NPH 20 x 20  Continue Metformin 1000 mg 2x a day.  Please return in 1.5 month with your sugar log.   - return to clinic in 3 months with her sugar log

## 2014-03-14 NOTE — Patient Instructions (Addendum)
Patient Instructions  Please change the ReliOn N (long acting) and R (short acting) insulins as follows: Insulin Before breakfast Before lunch Before dinner  Regular  8 units with a small meal  10 units with a regular meal  12 units with a large meal  8 units with a small meal  10 units with a regular meal  12 units with a large meal  12 units with a small meal  14 units with a regular meal  16 units with a large meal  NPH 20 x 20  Continue Metformin 1000 mg 2x a day.  Please return in 1.5 month with your sugar log.

## 2014-03-21 ENCOUNTER — Ambulatory Visit: Payer: Medicare Other | Admitting: Internal Medicine

## 2014-03-24 DIAGNOSIS — L97411 Non-pressure chronic ulcer of right heel and midfoot limited to breakdown of skin: Secondary | ICD-10-CM | POA: Diagnosis not present

## 2014-03-31 ENCOUNTER — Other Ambulatory Visit: Payer: Self-pay

## 2014-03-31 NOTE — Telephone Encounter (Signed)
Rx request for Gabapentin 300 mg capsule-Take 1 capsule by mouth every day #90  Pharm:  [facility]

## 2014-04-01 MED ORDER — GABAPENTIN 300 MG PO CAPS: 300.0000 mg | ORAL_CAPSULE | Freq: Every day | ORAL | Status: DC

## 2014-04-04 ENCOUNTER — Ambulatory Visit: Payer: Medicare Other | Admitting: Internal Medicine

## 2014-04-13 ENCOUNTER — Other Ambulatory Visit: Payer: Self-pay | Admitting: Internal Medicine

## 2014-04-13 ENCOUNTER — Other Ambulatory Visit: Payer: Self-pay | Admitting: Family Medicine

## 2014-04-14 ENCOUNTER — Other Ambulatory Visit: Payer: Self-pay | Admitting: *Deleted

## 2014-04-14 MED ORDER — INSULIN NPH (HUMAN) (ISOPHANE) 100 UNIT/ML ~~LOC~~ SUSP: 20.0000 [IU] | Freq: Two times a day (BID) | SUBCUTANEOUS | Status: DC

## 2014-04-16 ENCOUNTER — Ambulatory Visit: Payer: Medicare Other

## 2014-04-18 ENCOUNTER — Ambulatory Visit: Payer: Medicare Other

## 2014-04-18 DIAGNOSIS — L97411 Non-pressure chronic ulcer of right heel and midfoot limited to breakdown of skin: Secondary | ICD-10-CM | POA: Diagnosis not present

## 2014-05-02 ENCOUNTER — Ambulatory Visit: Payer: Medicare Other | Admitting: Internal Medicine

## 2014-05-16 ENCOUNTER — Other Ambulatory Visit: Payer: Self-pay | Admitting: Internal Medicine

## 2014-05-18 ENCOUNTER — Other Ambulatory Visit: Payer: Self-pay | Admitting: Internal Medicine

## 2014-05-28 ENCOUNTER — Ambulatory Visit (INDEPENDENT_AMBULATORY_CARE_PROVIDER_SITE_OTHER): Payer: Medicare Other | Admitting: Family Medicine

## 2014-05-28 ENCOUNTER — Encounter: Payer: Self-pay | Admitting: Family Medicine

## 2014-05-28 VITALS — BP 132/78 | HR 105 | Temp 97.7°F | Wt 205.0 lb

## 2014-05-28 DIAGNOSIS — R05 Cough: Secondary | ICD-10-CM | POA: Diagnosis not present

## 2014-05-28 DIAGNOSIS — R062 Wheezing: Secondary | ICD-10-CM | POA: Diagnosis not present

## 2014-05-28 DIAGNOSIS — R059 Cough, unspecified: Secondary | ICD-10-CM

## 2014-05-28 MED ORDER — HYDROCOD POLST-CPM POLST ER 10-8 MG/5ML PO SUER: 5.0000 mL | Freq: Two times a day (BID) | ORAL | Status: DC | PRN

## 2014-05-28 MED ORDER — ALBUTEROL SULFATE HFA 108 (90 BASE) MCG/ACT IN AERS: 2.0000 | INHALATION_SPRAY | RESPIRATORY_TRACT | Status: DC | PRN

## 2014-05-28 MED ORDER — METHYLPREDNISOLONE ACETATE 80 MG/ML IJ SUSP
80.0000 mg | Freq: Once | INTRAMUSCULAR | Status: AC
  Administered 2014-05-28: 80 mg via INTRAMUSCULAR

## 2014-05-28 NOTE — Progress Notes (Signed)
Subjective:    Patient ID: Beth Holden, female    DOB: July 16, 1945, 69 y.o.   MRN: [id number]  HPI Acute visit for cough. Onset a week ago. Occasionally productive of green sputum. She's had some sinus congestion. She started off with body aches and has mild sore throat. Occasional chills but no documented fever. She is wheezing some off and on. Nonsmoker. Her chronic prompt include type 2 diabetes which is been somewhat poorly controlled. She has albuterol inhaler which she has used previously for cough  Past Medical History  Diagnosis Date  . Erosive esophagitis   . Peripheral neuropathy   . IBS (irritable bowel syndrome)   . Hyperlipemia     takes Crestor daily  . ALOPECIA NEC   . Seasonal allergies     takes Zyrtec daily  . Headache(784.0)     occasionally;r/t sinus   . Joint pain   . History of colon polyps   . Diabetes mellitus type II     takes Metformin and Flexpen daily  . Hypertension     takes Enalapril daily  . GERD (gastroesophageal reflux disease)     takes Nexium bid  . Osteomyelitis   . Insomnia     takes Elavil nightly and as well as neuropathy  . Peripheral neuropathy   . Diverticulosis   . Anemia, mild    Past Surgical History  Procedure Laterality Date  . Vaginal hysterectomy  2001  . Oophorectomy  2001  . Tubal ligation    . Rotator cuff repair Right   . Laparoscopic appendectomy  01/05/2011    Procedure: APPENDECTOMY LAPAROSCOPIC;  Surgeon: Pedro Earls, MD;  Location: [facility];  Service: General;  Laterality: N/A;  . Colonoscopy    . Amputation  12/28/2011    Procedure: AMPUTATION DIGIT;  Surgeon: Newt Minion, MD;  Location: [place];  Service: Orthopedics;  Laterality: Right;  Right Foot 2nd Toe Amputation at MTP (metatarsophalangeal joint)  . Amputation Right 04/25/2012    Procedure: Right Foot 3rd Toe Amputation;  Surgeon: Newt Minion, MD;  Location: [place];  Service: Orthopedics;  Laterality: Right;  Right Foot Third Toe Amputation   .  Amputation Right 07/27/2012    Procedure: Right 4th Toe Amputation at Metatarsophalangeal;  Surgeon: Newt Minion, MD;  Location: [place];  Service: Orthopedics;  Laterality: Right;  Right 4th Toe Amputation at Metatarsophalangeal    reports that she has never smoked. She has never used smokeless tobacco. She reports that she does not drink alcohol or use illicit drugs. family history includes Brain cancer in her paternal uncle; COPD in her father; Coronary artery disease in her father; Dementia in her father; Diabetes in her father and other; Emphysema in her mother; Hyperlipidemia in her father; Hypertension in her father; Irritable bowel syndrome in an other family member; Lung cancer (age of onset: 44) in her mother; Stomach cancer in her maternal aunt and paternal aunt. There is no history of Colon cancer. Allergies  Allergen Reactions  . Codeine     Makes her crazy  . Propoxyphene Hcl Itching      Review of Systems  Constitutional: Negative for fever and chills.  HENT: Positive for congestion.   Respiratory: Positive for cough and wheezing.   Cardiovascular: Negative for chest pain.  Neurological: Negative for dizziness.       Objective:   Physical Exam  Constitutional: She appears well-developed and well-nourished.  HENT:  Right Ear: External ear normal.  Left  Ear: External ear normal.  Mouth/Throat: Oropharynx is clear and moist.  Neck: Neck supple.  Cardiovascular: Normal rate and regular rhythm.   Pulmonary/Chest: Effort normal. She has wheezes. She has no rales.  Musculoskeletal: She exhibits no edema.          Assessment & Plan:  Cough. Suspect viral process. She does have some reactive airway component. Continue albuterol for as needed use. Depo-Medrol 80 mg IM given and monitor blood sugars closely. Refill Tussionex 1 teaspoon daily at bedtime for severe nighttime cough.

## 2014-05-28 NOTE — Patient Instructions (Signed)
Follow up for any fever or increased shortness of breath Monitor blood sugars- they may increase slightly with the prednisone injection.

## 2014-05-28 NOTE — Progress Notes (Signed)
Pre visit review using our clinic review tool, if applicable. No additional management support is needed unless otherwise documented below in the visit note. 

## 2014-05-29 DIAGNOSIS — L97411 Non-pressure chronic ulcer of right heel and midfoot limited to breakdown of skin: Secondary | ICD-10-CM | POA: Diagnosis not present

## 2014-05-29 DIAGNOSIS — E1142 Type 2 diabetes mellitus with diabetic polyneuropathy: Secondary | ICD-10-CM | POA: Diagnosis not present

## 2014-05-29 DIAGNOSIS — B351 Tinea unguium: Secondary | ICD-10-CM | POA: Diagnosis not present

## 2014-05-30 ENCOUNTER — Ambulatory Visit (INDEPENDENT_AMBULATORY_CARE_PROVIDER_SITE_OTHER): Payer: Medicare Other | Admitting: Family Medicine

## 2014-05-30 ENCOUNTER — Encounter: Payer: Self-pay | Admitting: Family Medicine

## 2014-05-30 VITALS — BP 122/82 | HR 107 | Temp 97.7°F | Ht 65.5 in | Wt 201.9 lb

## 2014-05-30 DIAGNOSIS — I10 Essential (primary) hypertension: Secondary | ICD-10-CM

## 2014-05-30 DIAGNOSIS — J309 Allergic rhinitis, unspecified: Secondary | ICD-10-CM

## 2014-05-30 DIAGNOSIS — E785 Hyperlipidemia, unspecified: Secondary | ICD-10-CM | POA: Diagnosis not present

## 2014-05-30 DIAGNOSIS — K219 Gastro-esophageal reflux disease without esophagitis: Secondary | ICD-10-CM

## 2014-05-30 LAB — BASIC METABOLIC PANEL
BUN: 15 mg/dL (ref 6–23)
CO2: 31 mEq/L (ref 19–32)
Calcium: 9.7 mg/dL (ref 8.4–10.5)
Chloride: 100 mEq/L (ref 96–112)
Creatinine, Ser: 0.67 mg/dL (ref 0.40–1.20)
GFR: 93.41 mL/min (ref >60.00)
Glucose, Bld: 154 mg/dL — ABNORMAL HIGH (ref 70–99)
Potassium: 4.1 mEq/L (ref 3.5–5.1)
Sodium: 139 mEq/L (ref 135–145)

## 2014-05-30 LAB — LIPID PANEL
Cholesterol: 163 mg/dL (ref 0–200)
HDL: 53.7 mg/dL (ref >39.00)
LDL Cholesterol: 91 mg/dL (ref 0–99)
NonHDL: 109.3
Total CHOL/HDL Ratio: 3
Triglycerides: 92 mg/dL (ref 0.0–149.0)
VLDL: 18.4 mg/dL (ref 0.0–40.0)

## 2014-05-30 NOTE — Patient Instructions (Signed)
BEFORE YOU LEAVE: -labs -follow up in 4 months  Call and set up appointment with your diabetes doctor  We recommend the following healthy lifestyle measures: - eat a healthy diet consisting of lots of vegetables, fruits, beans, nuts, seeds, healthy meats such as white chicken and fish   - avoid starches, sweets, fried foods, fast food, processed foods, sodas, red meet and other fattening foods.  - get a least 150 minutes of aerobic exercise per week.

## 2014-05-30 NOTE — Progress Notes (Signed)
Pre visit review using our clinic review tool, if applicable. No additional management support is needed unless otherwise documented below in the visit note. 

## 2014-05-30 NOTE — Progress Notes (Signed)
HPI:  HTN/HLD: -meds: losartan 25, lovastatin 20 -denies: HA, SOB, DOE -we discussed using a diuretic for intermittent LE edema, she reports edema has been better  DM: -with circulatory and neurological complications -managed by her endocrinologist -meds: insulin, metformin, arb,gabapentin -poor compliance with follow up and recs -seeing podiatrist for foot ulcers   GERD/IBS: -has had extensive eval with GI, echo, CT -on PPI  ROS: See pertinent positives and negatives per HPI.  Past Medical History  Diagnosis Date  . Diabetes mellitus with neuropathy     sees endocrine  . HTN (hypertension)   . Hyperlipemia   . Seasonal allergies     takes Zyrtec daily  . Headache(784.0)     occasionally;r/t sinus   . Joint pain   . History of colon polyps   . IBS (irritable bowel syndrome)   . GERD (gastroesophageal reflux disease)     takes Nexium bid, hx erosive asophagitis  . Osteomyelitis   . Insomnia     takes Elavil nightly and as well as neuropathy  . Diverticulosis   . Anemia, mild   . Alopecia   . Hx of amputation of lesser toe     sees podiatrist  . Chronic cough     sees pulmonologist  . Chronic pain     chest wall and abd - s/p extensive eval    Past Surgical History  Procedure Laterality Date  . Vaginal hysterectomy  2001  . Oophorectomy  2001  . Tubal ligation    . Rotator cuff repair Right   . Laparoscopic appendectomy  01/05/2011    Procedure: APPENDECTOMY LAPAROSCOPIC;  Surgeon: Pedro Earls, MD;  Location[facility];  Service: General;  Laterality: N/A;  . Colonoscopy    . Amputation  12/28/2011    Procedure: AMPUTATION DIGIT;  Surgeon: Newt Minion, MD;  Location: [place];  Service: Orthopedics;  Laterality: Right;  Right Foot 2nd Toe Amputation at MTP (metatarsophalangeal joint)  . Amputation Right 04/25/2012    Procedure: Right Foot 3rd Toe Amputation;  Surgeon: Newt Minion, MD;  Location: [place];  Service: Orthopedics;  Laterality: Right;  Right  Foot Third Toe Amputation   . Amputation Right 07/27/2012    Procedure: Right 4th Toe Amputation at Metatarsophalangeal;  Surgeon: Newt Minion, MD;  Location: [place];  Service: Orthopedics;  Laterality: Right;  Right 4th Toe Amputation at Metatarsophalangeal    Family History  Problem Relation Age of Onset  . Lung cancer Mother 11    smoked heavily  . Hypertension Father   . Hyperlipidemia Father   . Diabetes Father   . Coronary artery disease Father   . Dementia Father   . Stomach cancer Paternal Aunt   . Brain cancer Paternal Uncle   . Colon cancer Neg Hx   . Irritable bowel syndrome      Several family members on fathers side   . Diabetes Other   . Stomach cancer Maternal Aunt   . Emphysema Mother   . COPD Father     smoked  .       History   Social History  . Marital Status: Married    Spouse Name: N/A  . Number of Children: 2  . Years of Education: N/A   Occupational History  . Retired     Social History Main Topics  . Smoking status: Never Smoker   . Smokeless tobacco: Never Used  . Alcohol Use: No  . Drug Use: No  .  Sexual Activity: Yes    Birth Control/ Protection: Surgical   Other Topics Concern  . None   Social History Narrative   Caffeine daily    HSG, [employer] no diploma   Married '66   1 dtr- '78; 1 son '71; 2 grandchildren   Occupation: retired 04   Dad with alzheimers-had to place in [place] (summer '10)           Current outpatient prescriptions:  .  acetaminophen (TYLENOL) 500 MG tablet, Take 500 mg by mouth every 6 (six) hours as needed for mild pain. , Disp: , Rfl:  .  albuterol (PROAIR HFA) 108 (90 BASE) MCG/ACT inhaler, Inhale 2 puffs into the lungs every 4 (four) hours as needed for wheezing or shortness of breath., Disp: 1 Inhaler, Rfl: 1 .  amitriptyline (ELAVIL) 25 MG tablet, Take 25 mg by mouth at bedtime., Disp: , Rfl:  .  chlorpheniramine-HYDROcodone (TUSSIONEX PENNKINETIC ER) 10-8 MG/5ML SUER, Take 5 mLs by mouth every 12 (twelve)  hours as needed for cough., Disp: 140 mL, Rfl: 0 .  Fluocinolone Acetonide (DERMOTIC) 0.01 % OIL, Place 1 drop in ear(s) daily as needed (itching). , Disp: , Rfl:  .  gabapentin (NEURONTIN) 300 MG capsule, Take 1 capsule (300 mg total) by mouth at bedtime., Disp: 90 capsule, Rfl: 0 .  glucose blood (ONE TOUCH ULTRA TEST) test strip, Use to test blood sugar 3 times daily as instructed. Dx code: E11.49., Disp: 300 each, Rfl: 5 .  insulin NPH Human (HUMULIN N,NOVOLIN N) 100 UNIT/ML injection, Inject 0.2 mLs (20 Units total) into the skin 2 (two) times daily before a meal., Disp: 20 mL, Rfl: 2 .  insulin regular (NOVOLIN R,HUMULIN R) 100 units/mL injection, Inject 0.08-0.16 mLs (8-16 Units total) into the skin 3 (three) times daily before meals. Sliding scale, Disp: 20 mL, Rfl: 2 .  Insulin Syringe-Needle U-100 31G X 5/16" 1 ML MISC, Use 2x a day, Disp: 100 each, Rfl: 11 .  losartan (COZAAR) 25 MG tablet, TAKE ONE TABLET BY MOUTH ONCE DAILY., Disp: 90 tablet, Rfl: 0 .  lovastatin (MEVACOR) 20 MG tablet, Take 1 tablet (20 mg total) by mouth at bedtime., Disp: 90 tablet, Rfl: 3 .  metFORMIN (GLUCOPHAGE) 1000 MG tablet, TAKE 1 TABLET (1,000 MG TOTAL) BY MOUTH 2 (TWO) TIMES DAILY WITH A MEAL., Disp: 180 tablet, Rfl: 1 .  Multiple Vitamin (MULTIVITAMIN WITH MINERALS) TABS tablet, Take 1 tablet by mouth daily., Disp: , Rfl:  .  Naproxen Sodium (ALEVE PO), Take 1 tablet by mouth as needed. Pt uses as needed, but does not use when uses Tylenol, Disp: , Rfl:  .  omeprazole (PRILOSEC) 20 MG capsule, Take 2 capsules (40 mg total) by mouth 2 (two) times daily., Disp: 360 capsule, Rfl: 3  EXAM:  Filed Vitals:   05/30/14 0855  BP: 122/82  Pulse: 107  Temp: 97.7 F (36.5 C)    Body mass index is 33.07 kg/(m^2).  GENERAL: vitals reviewed and listed above, alert, oriented, appears well hydrated and in no acute distress  HEENT: atraumatic, conjunttiva clear, no obvious abnormalities on inspection of external  nose and ears  NECK: no obvious masses on inspection  LUNGS: clear to auscultation bilaterally, no wheezes, rales or rhonchi, good air movement  CV: HRRR, no peripheral edema  MS: moves all extremities without noticeable abnormality  PSYCH: pleasant and cooperative, no obvious depression or anxiety  ASSESSMENT AND PLAN:  Discussed the following assessment and plan:  Hyperlipidemia -  Plan: Lipid panel  Essential hypertension - Plan: Basic metabolic panel  Allergic rhinitis, unspecified allergic rhinitis type  Gastroesophageal reflux disease, esophagitis presence not specified  -check fasting lipids -advised she follow up with her endocrinologist -stress lifestyle recs - no or low carb diet and regular exercise -Patient advised to return or notify a doctor immediately if symptoms worsen or persist or new concerns arise.  Patient Instructions  BEFORE YOU LEAVE: -labs -follow up in 4 months  Call and set up appointment with your diabetes doctor  We recommend the following healthy lifestyle measures: - eat a healthy diet consisting of lots of vegetables, fruits, beans, nuts, seeds, healthy meats such as white chicken and fish   - avoid starches, sweets, fried foods, fast food, processed foods, sodas, red meet and other fattening foods.  - get a least 150 minutes of aerobic exercise per week.       Colin Benton R.

## 2014-06-09 ENCOUNTER — Other Ambulatory Visit: Payer: Self-pay | Admitting: Family Medicine

## 2014-06-10 ENCOUNTER — Telehealth: Payer: Self-pay | Admitting: Internal Medicine

## 2014-06-13 ENCOUNTER — Encounter: Payer: Self-pay | Admitting: Family Medicine

## 2014-06-13 ENCOUNTER — Other Ambulatory Visit: Payer: Self-pay | Admitting: *Deleted

## 2014-06-13 NOTE — Telephone Encounter (Signed)
Called pt and she advised that she uses syringes, not pen needles. Sent in refill of pt's syringes.

## 2014-06-13 NOTE — Telephone Encounter (Signed)
Patient need a reffil for her pen needles.

## 2014-06-25 ENCOUNTER — Telehealth: Payer: Self-pay | Admitting: Internal Medicine

## 2014-06-25 NOTE — Telephone Encounter (Signed)
Team [facility] note dated 06/24/14 6:40 PM OVERDOSE took too much medication at once Initial Comment Caller is supposed to take 20 units of long acting insulin and 10 units of short acting insulin. The caller takes Novalin R insulin. The caller took 30 units of the short acting insulin just now by mistake.  ---Caller states that she is a diabetic and that she takes Novalin N 20 units and Novalin R 10 units with each meal but that tonight with supper she accidently took 30 units of the Novalin R. States that she feels a little shaky but denies any other s/s. She has eaten dinner and is now drinking a regular coke. BS is 209.  Spoke with Dr Eliezer Champagne and instructions received to have the pt check her bs q 1 hr for the next 8-12 hr and if it drops below 100 to make sure and eat something. Instructions given to the pt and advised to call us back with any concerns.

## 2014-06-25 NOTE — Telephone Encounter (Signed)
Read message below

## 2014-06-26 NOTE — Telephone Encounter (Signed)
Called and left VM, to check on pt per dr. Howell Rucks. Left VM to call back with any further problems or concerns.

## 2014-07-04 ENCOUNTER — Other Ambulatory Visit: Payer: Self-pay | Admitting: Family Medicine

## 2014-07-07 ENCOUNTER — Encounter: Payer: Self-pay | Admitting: Family Medicine

## 2014-07-11 DIAGNOSIS — L97411 Non-pressure chronic ulcer of right heel and midfoot limited to breakdown of skin: Secondary | ICD-10-CM | POA: Diagnosis not present

## 2014-07-11 DIAGNOSIS — B351 Tinea unguium: Secondary | ICD-10-CM | POA: Diagnosis not present

## 2014-07-11 DIAGNOSIS — E1142 Type 2 diabetes mellitus with diabetic polyneuropathy: Secondary | ICD-10-CM | POA: Diagnosis not present

## 2014-07-12 ENCOUNTER — Other Ambulatory Visit: Payer: Self-pay | Admitting: Family Medicine

## 2014-07-15 NOTE — Telephone Encounter (Signed)
Beth Holden,  Please call pt. Please ensure she has follow up with endocrine. No further refills on meds until she has appointment with endo for her diabetes or follows up here about her diabetes. I am quite concerned about her diabetes and per review of last endo notes she was to follow up in 1.5 months and not appts are scheduled.

## 2014-07-16 NOTE — Telephone Encounter (Signed)
I called the pt and she stated her medication should come from Dr Cruzita Lederer and I sent the request to Dr Cruzita Lederer.

## 2014-07-17 NOTE — Telephone Encounter (Signed)
Beth Holden, can you please call pt:  Needs an appt. Also, please find out what med she needs refilled and let's send a Rx. The Rx refill field is empty.Marland KitchenMarland Kitchen

## 2014-08-05 ENCOUNTER — Other Ambulatory Visit: Payer: Self-pay | Admitting: Family Medicine

## 2014-08-18 ENCOUNTER — Other Ambulatory Visit: Payer: Self-pay | Admitting: Internal Medicine

## 2014-08-22 DIAGNOSIS — L97411 Non-pressure chronic ulcer of right heel and midfoot limited to breakdown of skin: Secondary | ICD-10-CM | POA: Diagnosis not present

## 2014-08-22 DIAGNOSIS — B351 Tinea unguium: Secondary | ICD-10-CM | POA: Diagnosis not present

## 2014-09-05 ENCOUNTER — Other Ambulatory Visit: Payer: Self-pay

## 2014-09-05 DIAGNOSIS — Z1231 Encounter for screening mammogram for malignant neoplasm of breast: Secondary | ICD-10-CM

## 2014-09-05 DIAGNOSIS — L304 Erythema intertrigo: Secondary | ICD-10-CM | POA: Diagnosis not present

## 2014-09-05 DIAGNOSIS — D485 Neoplasm of uncertain behavior of skin: Secondary | ICD-10-CM | POA: Diagnosis not present

## 2014-09-08 ENCOUNTER — Ambulatory Visit
Admission: RE | Admit: 2014-09-08 | Discharge: 2014-09-08 | Disposition: A | Discharge status: Home / Self Care | Payer: Medicare Other | Source: Ambulatory Visit

## 2014-09-08 DIAGNOSIS — Z1231 Encounter for screening mammogram for malignant neoplasm of breast: Secondary | ICD-10-CM

## 2014-09-17 DIAGNOSIS — L989 Disorder of the skin and subcutaneous tissue, unspecified: Secondary | ICD-10-CM | POA: Diagnosis not present

## 2014-09-18 DIAGNOSIS — E1142 Type 2 diabetes mellitus with diabetic polyneuropathy: Secondary | ICD-10-CM | POA: Diagnosis not present

## 2014-09-18 DIAGNOSIS — L97411 Non-pressure chronic ulcer of right heel and midfoot limited to breakdown of skin: Secondary | ICD-10-CM | POA: Diagnosis not present

## 2014-09-18 DIAGNOSIS — B351 Tinea unguium: Secondary | ICD-10-CM | POA: Diagnosis not present

## 2014-09-20 ENCOUNTER — Other Ambulatory Visit: Payer: Self-pay | Admitting: Family Medicine

## 2014-10-13 ENCOUNTER — Other Ambulatory Visit: Payer: Self-pay | Admitting: Gastroenterology

## 2014-10-16 DIAGNOSIS — L97411 Non-pressure chronic ulcer of right heel and midfoot limited to breakdown of skin: Secondary | ICD-10-CM | POA: Diagnosis not present

## 2014-10-16 DIAGNOSIS — E1142 Type 2 diabetes mellitus with diabetic polyneuropathy: Secondary | ICD-10-CM | POA: Diagnosis not present

## 2014-10-19 ENCOUNTER — Other Ambulatory Visit: Payer: Self-pay | Admitting: Internal Medicine

## 2014-10-20 ENCOUNTER — Other Ambulatory Visit: Payer: Self-pay | Admitting: Internal Medicine

## 2014-11-13 DIAGNOSIS — L97411 Non-pressure chronic ulcer of right heel and midfoot limited to breakdown of skin: Secondary | ICD-10-CM | POA: Diagnosis not present

## 2014-12-12 DIAGNOSIS — E1142 Type 2 diabetes mellitus with diabetic polyneuropathy: Secondary | ICD-10-CM | POA: Diagnosis not present

## 2014-12-12 DIAGNOSIS — B351 Tinea unguium: Secondary | ICD-10-CM | POA: Diagnosis not present

## 2014-12-12 DIAGNOSIS — L97411 Non-pressure chronic ulcer of right heel and midfoot limited to breakdown of skin: Secondary | ICD-10-CM | POA: Diagnosis not present

## 2014-12-22 ENCOUNTER — Telehealth: Payer: Self-pay | Admitting: Family Medicine

## 2014-12-22 NOTE — Telephone Encounter (Signed)
Error/njr °

## 2014-12-26 ENCOUNTER — Encounter: Payer: Self-pay | Admitting: Family Medicine

## 2014-12-26 ENCOUNTER — Ambulatory Visit (INDEPENDENT_AMBULATORY_CARE_PROVIDER_SITE_OTHER): Payer: Medicare Other | Admitting: Family Medicine

## 2014-12-26 VITALS — BP 132/78 | HR 104 | Temp 98.1°F | Ht 65.5 in | Wt 206.3 lb

## 2014-12-26 DIAGNOSIS — J0100 Acute maxillary sinusitis, unspecified: Secondary | ICD-10-CM | POA: Diagnosis not present

## 2014-12-26 DIAGNOSIS — R300 Dysuria: Secondary | ICD-10-CM

## 2014-12-26 DIAGNOSIS — R600 Localized edema: Secondary | ICD-10-CM

## 2014-12-26 LAB — POCT URINALYSIS DIPSTICK
Bilirubin, UA: NEGATIVE
Blood, UA: NEGATIVE
Glucose, UA: NEGATIVE
Ketones, UA: NEGATIVE
Leukocytes, UA: NEGATIVE
Nitrite, UA: NEGATIVE
Protein, UA: NEGATIVE
Spec Grav, UA: 1.02
Urobilinogen, UA: 0.2
pH, UA: 6

## 2014-12-26 IMAGING — CR DG CHEST 2V
2 series · 2 of 2 positions shown · non-contrast
Comparison: 12/26/2011..

CLINICAL DATA: Preop evaluation.  History of diabetes and
hypertension.

CHEST - 2 VIEW

[view not recorded (1 of 2)]
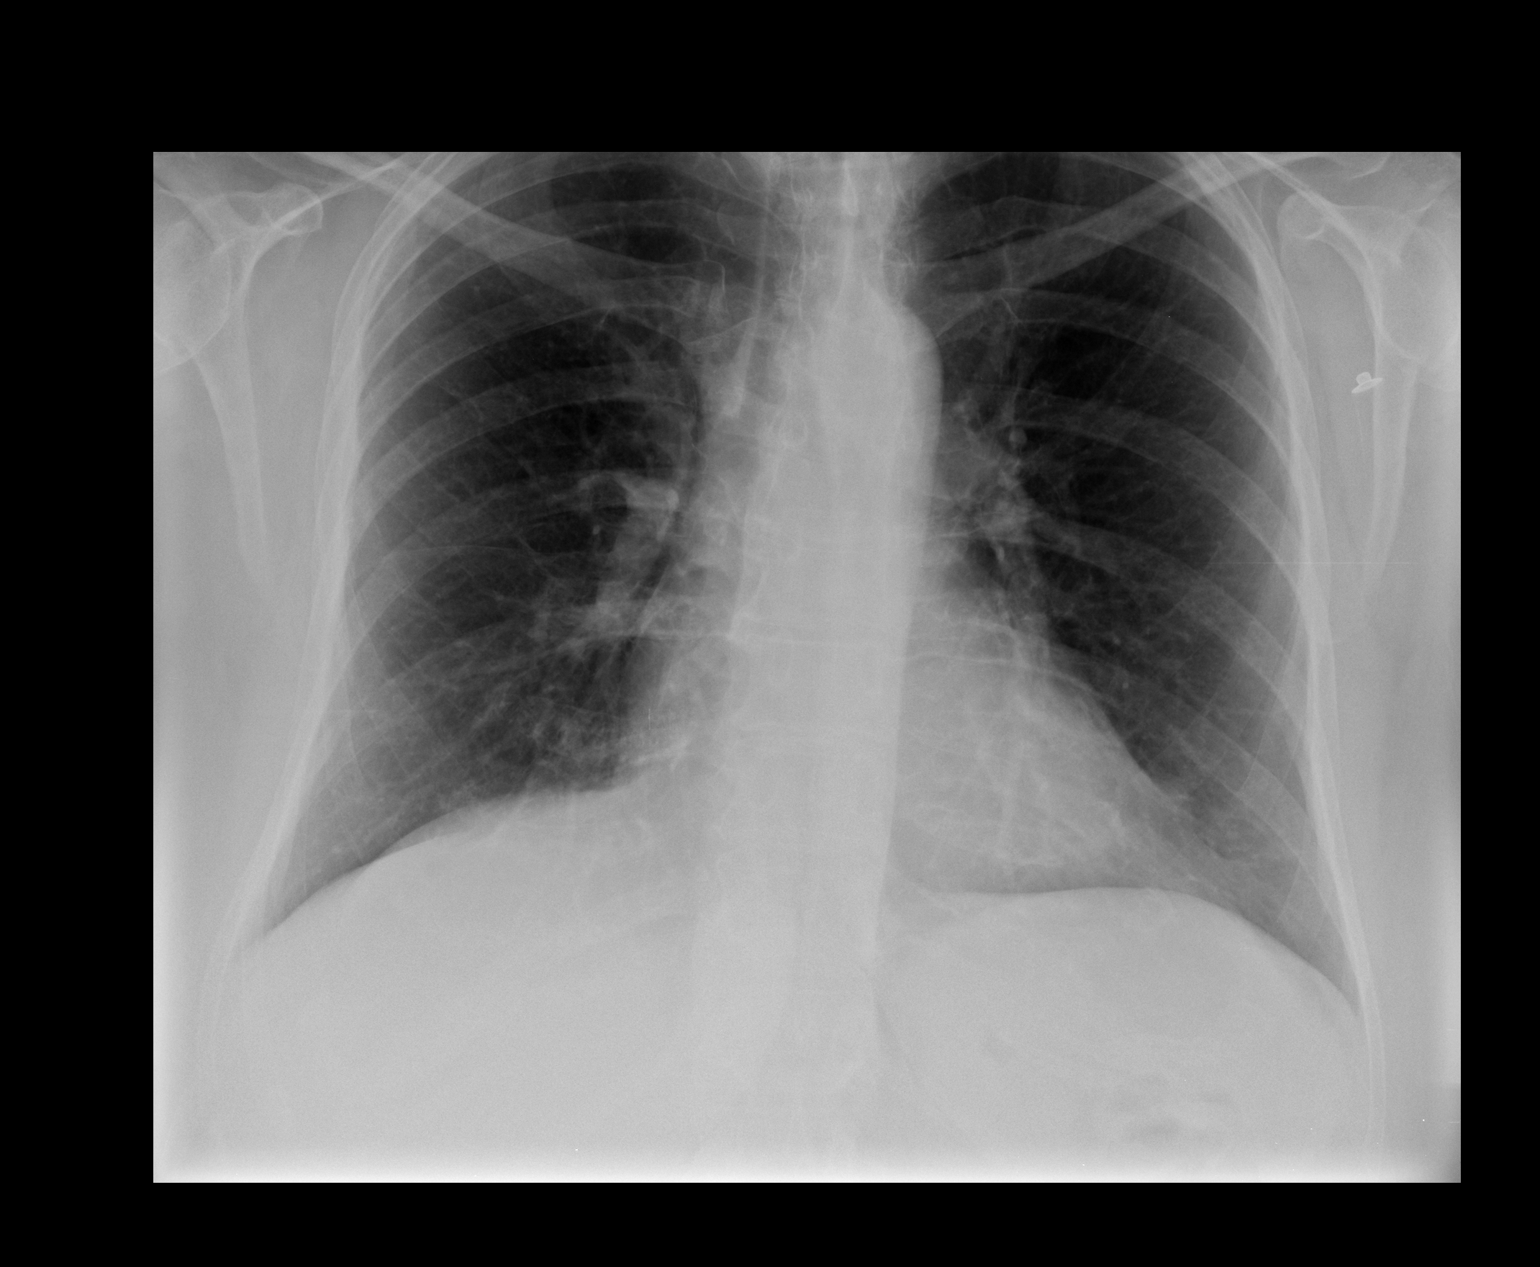

[view not recorded (2 of 2)]
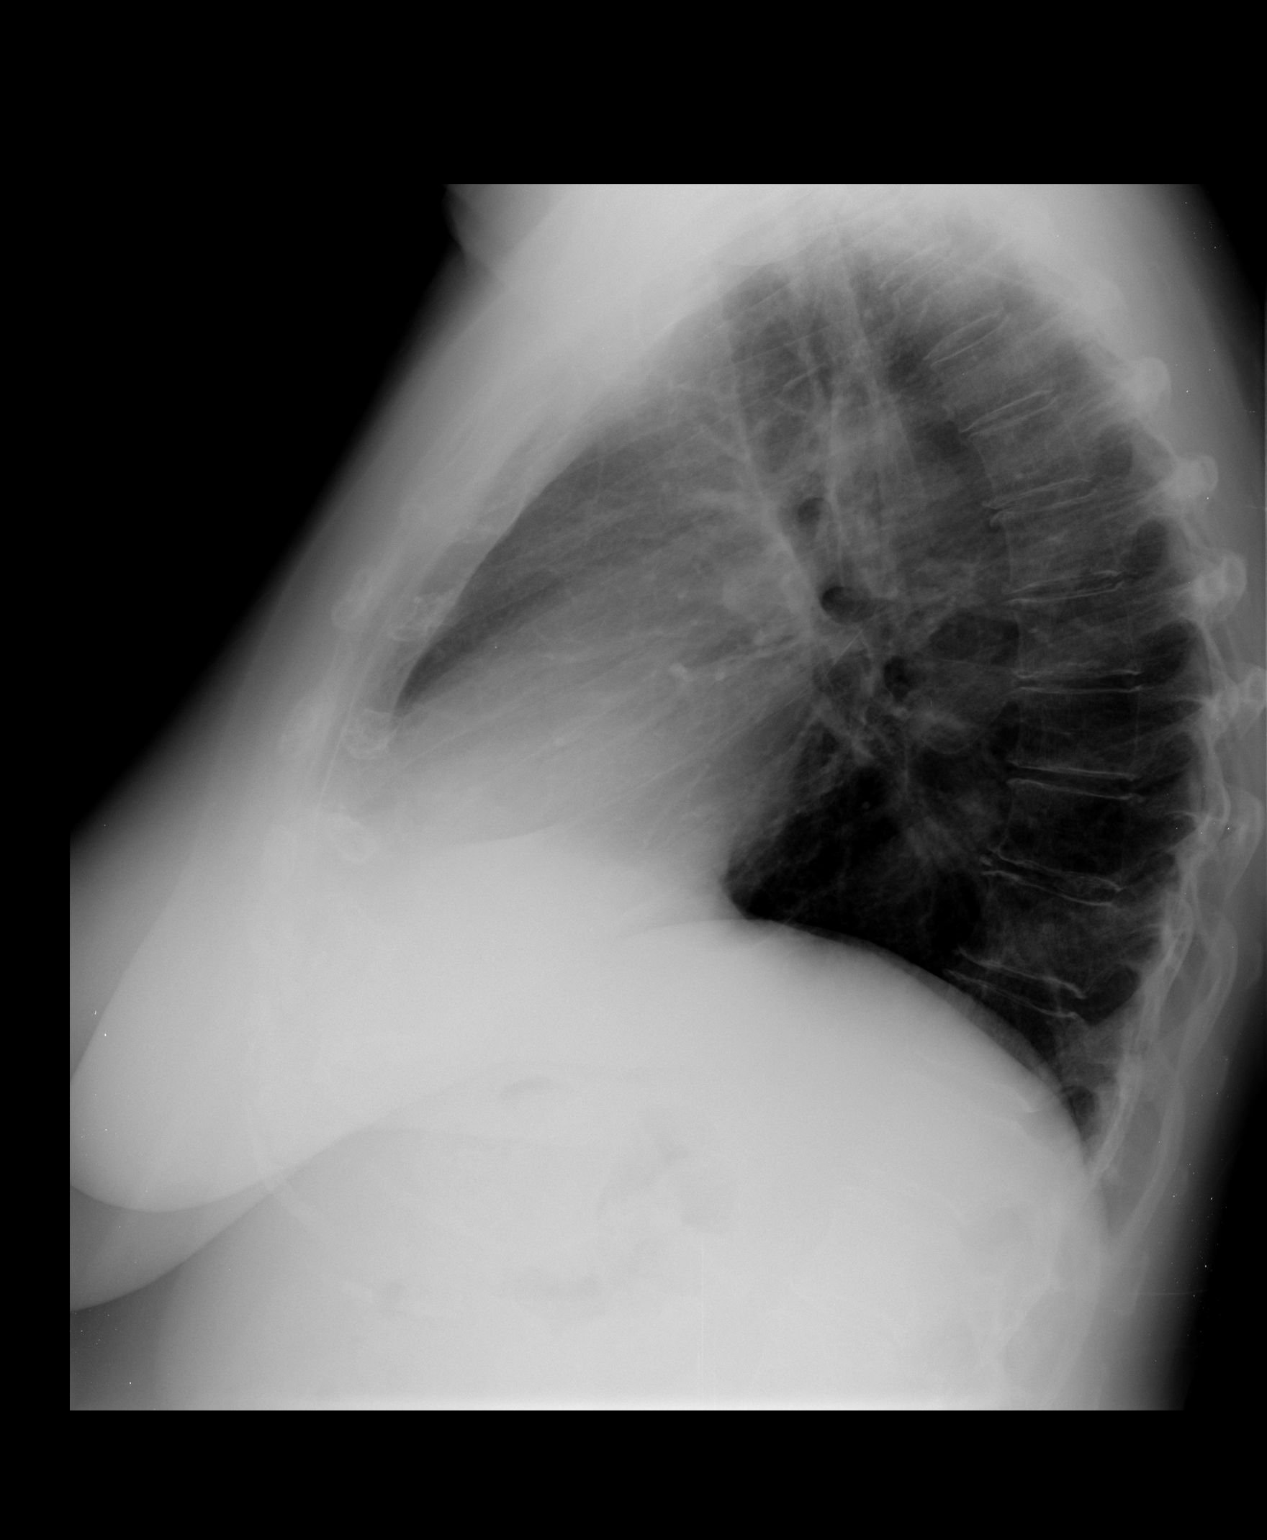

[2 of 2 positions shown; findings below may reference images not displayed]

FINDINGS: Cardiac silhouette is upper normal size.  Mediastinal and
hilar contours appear stable.  No pulmonary infiltrates or masses
are seen.  The minimal central peribronchial thickening appears
stable.  No pulmonary infiltrates or masses are seen. No pleural
abnormality is evident.  There is minimal degenerative spondylosis.
IMPRESSION: No acute cardiopulmonary or pleural disease is seen.  Stable
minimal central peribronchial thickening is present.

## 2014-12-26 MED ORDER — AMOXICILLIN-POT CLAVULANATE 875-125 MG PO TABS: 1.0000 | ORAL_TABLET | Freq: Two times a day (BID) | ORAL | Status: DC

## 2014-12-26 MED ORDER — FLUCONAZOLE 150 MG PO TABS: 150.0000 mg | ORAL_TABLET | Freq: Once | ORAL | Status: DC

## 2014-12-26 NOTE — Progress Notes (Signed)
Pre visit review using our clinic review tool, if applicable. No additional management support is needed unless otherwise documented below in the visit note. 

## 2014-12-26 NOTE — Patient Instructions (Signed)
BEFORE YOU LEAVE: -udip, culture if abnormal  Compression socks, elevation to help with swelling  Start and complete antibiotic for sinus infection   Follow up with your endocrinologist about your diabetes  Follow up if symptoms worsen or persist

## 2014-12-26 NOTE — Progress Notes (Signed)
HPI:  Acute visit for multiple complaints:  Ankle edema: -bilat L>R, chronic, resolved now -for several years, but seemed worse for a few months -seems to occur more after being on feet for some time -no redness, pain or warmth, SOB, DOE  Sinus infection: -for 1 month -started as a "cold" - sinus congestion, PND, cough, now with L sinus pain, green mucus -denies: fevers, malaise, SOB, wheezing, chills -reports always has to take diflucan pill if take abx for yeast inf  Dysuria: -several weeks -odor, urgency -denies: flank pain, nausea, vomiting, diarrhea, vaginal symptoms  ROS: See pertinent positives and negatives per HPI.  Past Medical History  Diagnosis Date  . Diabetes mellitus with neuropathy (HCC)     sees endocrine  . HTN (hypertension)   . Hyperlipemia   . Seasonal allergies     takes Zyrtec daily  . Headache(784.0)     occasionally;r/t sinus   . Joint pain   . History of colon polyps   . IBS (irritable bowel syndrome)   . GERD (gastroesophageal reflux disease)     takes Nexium bid, hx erosive esophagitis  . Osteomyelitis ([place])   . Insomnia     takes Elavil nightly  . Diverticulosis   . Anemia, mild   . Alopecia   . Hx of amputation of lesser toe ([place])     sees podiatrist  . Chronic cough     sees pulmonologist  . Chronic pain     chest wall and abd - s/p extensive eval    Past Surgical History  Procedure Laterality Date  . Vaginal hysterectomy  2001  . Oophorectomy  2001  . Tubal ligation    . Rotator cuff repair Right   . Laparoscopic appendectomy  01/05/2011    Procedure: APPENDECTOMY LAPAROSCOPIC;  Surgeon: Pedro Earls, MD;  Location: [facility];  Service: General;  Laterality: N/A;  . Colonoscopy    . Amputation  12/28/2011    Procedure: AMPUTATION DIGIT;  Surgeon: Newt Minion, MD;  Location: [place];  Service: Orthopedics;  Laterality: Right;  Right Foot 2nd Toe Amputation at MTP (metatarsophalangeal joint)  . Amputation Right 04/25/2012     Procedure: Right Foot 3rd Toe Amputation;  Surgeon: Newt Minion, MD;  Location: [place];  Service: Orthopedics;  Laterality: Right;  Right Foot Third Toe Amputation   . Amputation Right 07/27/2012    Procedure: Right 4th Toe Amputation at Metatarsophalangeal;  Surgeon: Newt Minion, MD;  Location: [place];  Service: Orthopedics;  Laterality: Right;  Right 4th Toe Amputation at Metatarsophalangeal    Family History  Problem Relation Age of Onset  . Lung cancer Mother 19    smoked heavily  . Hypertension Father   . Hyperlipidemia Father   . Diabetes Father   . Coronary artery disease Father   . Dementia Father   . Stomach cancer Paternal Aunt   . Brain cancer Paternal Uncle   . Colon cancer Neg Hx   . Irritable bowel syndrome      Several family members on fathers side   . Diabetes Other   . Stomach cancer Maternal Aunt   . Emphysema Mother   . COPD Father     smoked  .       Social History   Social History  . Marital Status: Married    Spouse Name: N/A  . Number of Children: 2  . Years of Education: N/A   Occupational History  . Retired  Social History Main Topics  . Smoking status: Never Smoker   . Smokeless tobacco: Never Used  . Alcohol Use: No  . Drug Use: No  . Sexual Activity: Yes    Birth Control/ Protection: Surgical   Other Topics Concern  . None   Social History Narrative   Caffeine daily    HSG, [employer] no diploma   Married '66   1 dtr- '78; 1 son '71; 2 grandchildren   Occupation: retired 04   Dad with alzheimers-had to place in [place] (summer '10)           Current outpatient prescriptions:  .  acetaminophen (TYLENOL) 500 MG tablet, Take 500 mg by mouth every 6 (six) hours as needed for mild pain. , Disp: , Rfl:  .  albuterol (PROAIR HFA) 108 (90 BASE) MCG/ACT inhaler, Inhale 2 puffs into the lungs every 4 (four) hours as needed for wheezing or shortness of breath., Disp: 1 Inhaler, Rfl: 1 .  amitriptyline (ELAVIL) 25 MG tablet, TAKE 1 TABLET  AT BEDTIME, Disp: 90 tablet, Rfl: 3 .  Fluocinolone Acetonide (DERMOTIC) 0.01 % OIL, Place 1 drop in ear(s) daily as needed (itching). , Disp: , Rfl:  .  gabapentin (NEURONTIN) 300 MG capsule, TAKE 1 CAPSULE (300 MG TOTAL) BY MOUTH AT BEDTIME., Disp: 90 capsule, Rfl: 0 .  glucose blood (ONE TOUCH ULTRA TEST) test strip, Use to test blood sugar 3 times daily as instructed. Dx code: E11.49., Disp: 300 each, Rfl: 5 .  insulin regular (NOVOLIN R RELION) 100 units/mL injection, Inject 0.08-0.12 mLs (8-12 Units total) into the skin 3 (three) times daily before meals., Disp: 20 mL, Rfl: 1 .  insulin regular (NOVOLIN R,HUMULIN R) 100 units/mL injection, Inject 0.08-0.16 mLs (8-16 Units total) into the skin 3 (three) times daily before meals. Sliding scale, Disp: 20 mL, Rfl: 2 .  losartan (COZAAR) 25 MG tablet, TAKE ONE TABLET BY MOUTH ONCE DAILY., Disp: 90 tablet, Rfl: 0 .  lovastatin (MEVACOR) 20 MG tablet, TAKE ONE TABLET BY MOUTH AT BEDTIME, Disp: 90 tablet, Rfl: 0 .  metFORMIN (GLUCOPHAGE) 1000 MG tablet, TAKE 1 TABLET (1,000 MG TOTAL) BY MOUTH 2 (TWO) TIMES DAILY WITH A MEAL., Disp: 180 tablet, Rfl: 1 .  metFORMIN (GLUCOPHAGE) 1000 MG tablet, TAKE 1 TABLET (1,000 MG TOTAL) BY MOUTH 2 (TWO) TIMES DAILY WITH A MEAL., Disp: 180 tablet, Rfl: 0 .  Multiple Vitamin (MULTIVITAMIN WITH MINERALS) TABS tablet, Take 1 tablet by mouth daily., Disp: , Rfl:  .  Naproxen Sodium (ALEVE PO), Take 1 tablet by mouth as needed. Pt uses as needed, but does not use when uses Tylenol, Disp: , Rfl:  .  NOVOLIN N RELION 100 UNIT/ML injection, INJECT 0.2 MLS (20 UNITS TOTAL) INTO THE SKIN 2 TIMES DAILY BEFORE A MEAL, Disp: 20 mL, Rfl: 0 .  omeprazole (PRILOSEC) 20 MG capsule, Take 2 capsules (40 mg total) by mouth 2 (two) times daily., Disp: 360 capsule, Rfl: 3 .  omeprazole (PRILOSEC) 20 MG capsule, TAKE TWO CAPSULES BY MOUTH TWICE DAILY, Disp: 360 capsule, Rfl: 0 .  RELION INSULIN SYRINGE 1ML/31G 31G X 5/16" 1 ML MISC, USE 2  TIMES A DAY, Disp: 100 each, Rfl: 5 .  amoxicillin-clavulanate (AUGMENTIN) 875-125 MG tablet, Take 1 tablet by mouth 2 (two) times daily., Disp: 14 tablet, Rfl: 0 .  fluconazole (DIFLUCAN) 150 MG tablet, Take 1 tablet (150 mg total) by mouth once., Disp: 1 tablet, Rfl: 0  EXAM:  Filed Vitals:  12/26/14 1305  BP: 132/78  Pulse: 104  Temp: 98.1 F (36.7 C)    Body mass index is 33.8 kg/(m^2).  GENERAL: vitals reviewed and listed above, alert, oriented, appears well hydrated and in no acute distress  HEENT: atraumatic, conjunttiva clear, no obvious abnormalities on inspection of external nose and ears, normal appearance of ear canals and TMs, thick nasal congestion, mild post oropharyngeal erythema with PND, no tonsillar edema or exudate, no sinus TTP  NECK: no obvious masses on inspection  LUNGS: clear to auscultation bilaterally, no wheezes, rales or rhonchi, good air movement  CV: HRRR, tr bilat ankle edema, no redness, warmth, TTP over deep veins, normal pedal pulses  ABD: no CVA TTP  MS: moves all extremities without noticeable abnormality  PSYCH: pleasant and cooperative, no obvious depression or anxiety  ASSESSMENT AND PLAN:  Discussed the following assessment and plan:  Acute maxillary sinusitis, recurrence not specified  Dysuria - Plan: POC Urinalysis Dipstick, Culture, Urine  Bilateral edema of lower extremity  -Patient advised to return or notify a doctor immediately if symptoms worsen or persist or new concerns arise.  Patient Instructions  BEFORE YOU LEAVE: -udip, culture if abnormal  Compression socks, elevation to help with swelling  Start and complete antibiotic for sinus infection   Follow up with your endocrinologist about your diabetes  Follow up if symptoms worsen or persist      KIM, HANNAH R.

## 2014-12-28 LAB — URINE CULTURE

## 2014-12-30 ENCOUNTER — Other Ambulatory Visit: Payer: Self-pay | Admitting: Internal Medicine

## 2014-12-30 NOTE — Telephone Encounter (Signed)
Patient need refill of, NOVOLIN N RELION 100 UNIT/ML injection, insulin regular (NOVOLIN R,HUMULIN R) 100 units/mL injection send to,  [facility] [place], [place] - [address] [phone] (Phone) [phone] (Fax)

## 2014-12-31 ENCOUNTER — Telehealth: Payer: Self-pay | Admitting: Internal Medicine

## 2014-12-31 NOTE — Telephone Encounter (Signed)
I have not seen her in 9 mo... But if she is still on the same insulin doses as at last visit, she needs to go up by 4-6 units or more. Also, may need to bolus a low dose even if not eating and check sugars frequently. Keep NPH the same for now.  Also, no sweet drinks. If she cannot take po, she may need to go to the hospital for iv hydration.

## 2014-12-31 NOTE — Telephone Encounter (Signed)
Patient called stating she believes she may have the flu  Vomiting started at 2:00 am and had 7 episodes  Also she is having diarrhea   Blood sugars at 7:30 am was 237  Please advise patient on what she needs to so   Thank you

## 2014-12-31 NOTE — Telephone Encounter (Signed)
Called pt and advised her per Dr Arman Filter note. Pt voiced understanding will call back on Friday to let us know how she is doing. Vomiting has stopped. Pt has an appt scheduled.

## 2014-12-31 NOTE — Telephone Encounter (Signed)
Returned pt's call. Pt stated her sugars have been up and down. More so since she began to get sick.  12/21 3:02am 369 (pt began vomiting at 2:00am) 8:17am 237 10:05am 319 (pt had some sprite) 11:27am 229 12:21pm 255  12/20 7:03am 203 9:36am 350 10:24am 315 9:32pm 174 11:32pm 241  12/18 9:03am 108 9:32pm 261  12/17 8:29am 224 11:33pm 230  12?16 9:12am 198 6:32pm 301  Please advise. Thank you.

## 2015-01-15 DIAGNOSIS — L97411 Non-pressure chronic ulcer of right heel and midfoot limited to breakdown of skin: Secondary | ICD-10-CM | POA: Diagnosis not present

## 2015-01-15 DIAGNOSIS — E1142 Type 2 diabetes mellitus with diabetic polyneuropathy: Secondary | ICD-10-CM | POA: Diagnosis not present

## 2015-01-15 DIAGNOSIS — B351 Tinea unguium: Secondary | ICD-10-CM | POA: Diagnosis not present

## 2015-01-19 ENCOUNTER — Other Ambulatory Visit: Payer: Self-pay | Admitting: Internal Medicine

## 2015-01-25 ENCOUNTER — Encounter: Payer: Self-pay | Admitting: Internal Medicine

## 2015-01-25 ENCOUNTER — Encounter: Payer: Self-pay | Admitting: Family Medicine

## 2015-01-25 ENCOUNTER — Other Ambulatory Visit: Payer: Self-pay | Admitting: Family Medicine

## 2015-01-26 ENCOUNTER — Other Ambulatory Visit: Payer: Self-pay | Admitting: Family Medicine

## 2015-01-30 DIAGNOSIS — Z0000 Encounter for general adult medical examination without abnormal findings: Secondary | ICD-10-CM | POA: Diagnosis not present

## 2015-01-30 DIAGNOSIS — R3915 Urgency of urination: Secondary | ICD-10-CM | POA: Diagnosis not present

## 2015-02-02 ENCOUNTER — Other Ambulatory Visit: Payer: Self-pay | Admitting: *Deleted

## 2015-02-02 ENCOUNTER — Ambulatory Visit (INDEPENDENT_AMBULATORY_CARE_PROVIDER_SITE_OTHER): Payer: Medicare Other | Admitting: Internal Medicine

## 2015-02-02 ENCOUNTER — Other Ambulatory Visit (INDEPENDENT_AMBULATORY_CARE_PROVIDER_SITE_OTHER): Payer: Medicare Other | Admitting: *Deleted

## 2015-02-02 ENCOUNTER — Encounter: Payer: Self-pay | Admitting: Internal Medicine

## 2015-02-02 VITALS — BP 118/78 | HR 103 | Temp 98.2°F | Resp 12 | Wt 207.8 lb

## 2015-02-02 DIAGNOSIS — E1165 Type 2 diabetes mellitus with hyperglycemia: Secondary | ICD-10-CM

## 2015-02-02 DIAGNOSIS — E1143 Type 2 diabetes mellitus with diabetic autonomic (poly)neuropathy: Secondary | ICD-10-CM | POA: Diagnosis not present

## 2015-02-02 DIAGNOSIS — E1151 Type 2 diabetes mellitus with diabetic peripheral angiopathy without gangrene: Secondary | ICD-10-CM

## 2015-02-02 DIAGNOSIS — IMO0002 Reserved for concepts with insufficient information to code with codable children: Secondary | ICD-10-CM

## 2015-02-02 LAB — POCT GLYCOSYLATED HEMOGLOBIN (HGB A1C): Hemoglobin A1C: 8.7

## 2015-02-02 MED ORDER — METFORMIN HCL 1000 MG PO TABS: ORAL_TABLET | ORAL | Status: DC

## 2015-02-02 MED ORDER — INSULIN NPH (HUMAN) (ISOPHANE) 100 UNIT/ML ~~LOC~~ SUSP
SUBCUTANEOUS | Status: DC
Start: 2015-02-02 — End: 2015-03-19

## 2015-02-02 MED ORDER — GABAPENTIN 300 MG PO CAPS: ORAL_CAPSULE | ORAL | Status: DC

## 2015-02-02 MED ORDER — INSULIN REGULAR HUMAN 100 UNIT/ML IJ SOLN: 12.0000 [IU] | Freq: Three times a day (TID) | INTRAMUSCULAR | Status: DC

## 2015-02-02 MED ORDER — AMITRIPTYLINE HCL 25 MG PO TABS: 25.0000 mg | ORAL_TABLET | Freq: Every day | ORAL | Status: DC

## 2015-02-02 MED ORDER — GABAPENTIN 300 MG PO CAPS: 300.0000 mg | ORAL_CAPSULE | Freq: Every day | ORAL | Status: DC

## 2015-02-02 NOTE — Patient Instructions (Signed)
Please continue: - Metformin 1000 mg 2x a day  Please change ReliOn N (long acting) and R (short acting) insulins as follows: Insulin Before breakfast Before lunch Before dinner  Regular  14 units with a small meal  16 units with a regular meal  18 units with a large meal  14 units with a small meal  16 units with a regular meal  18 units with a large meal  14 units with a small meal  16 units with a regular meal  18 units with a large meal  NPH 20 x 25    If you have a snack at night, please add 4-5 units on Regular (R) insulin.  Please return in 1.5 months with your sugar log.

## 2015-02-02 NOTE — Progress Notes (Signed)
Subjective:     Patient ID: Beth Holden, female   DOB: March 14, 1945, 70 y.o.   MRN: [id number]  HPI [name] is a pleasant 70 y.o. 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 Last visit 10 mo ago!  She had UTIs and a GI virus and her aunt had a stroke and she is a caregiver.   Last HbA1C was: Lab Results  Component Value Date   HGBA1C 8.7* 02/28/2014   HGBA1C 7.3* 07/19/2013   HGBA1C 7.4* 04/15/2013   She was on the following meds, but she was in the doughnut hole >> we stopped in 04/2013: - Metformin 1000 mg bid - Levemir 47 units in am and 37 units in pm >> $800 for 3 mo supply - Januvia 100 mg daily - $450 for 3 mo - Invokana 100 mg - $455 for 3 mo We discussed about starting her on a VGo mechanical pump in the past. She had an appointment with diabetes education but decided not to pursue it.  She is now on: - Metformin 1000 mg bid - ReliOn N (long acting) and R (short acting) insulins as follows: Insulin Before breakfast Before lunch Before dinner  Regular  12  units with a small meal  14 units with a regular meal  16 units with a large meal  12 units with a small meal  14 units with a regular meal  16 units with a large meal  12 units with a small meal  14 units with a regular meal  16 units with a large meal  NPH 20 x 20   She checks her sugars ~3x a day - per meter: - am: 83-155 >> 71-197 >> 82-157 >> 96-170 (201) >> 83-140 (207) >> 90, 150-190, 230 >> 167-308 - 2h after b'fast 141 >> 145-177 >> 122-185 >> n/c >> 141-183 (248) >> 131-180 (235 x1) >> 150-175 >> n/c - prelunch: 80-124 >> 120-184 >> n/c >> 106, 157 >> 164 >> n/c - 2-3h after lunch: 150-200 >> 92-175 >> 48 x1 (skipped lunch) 91-101, 218 >> 200-250 >> 76-204 - before dinner: 75-166 >> 94-151 >> n/c >> 116, 139 >> 57-136 >> 150-175 >> 205-212 - after dinner:  <200 >> 196 >> 208-318 >> 180-225 >> 123, 141, 240 >> 78-134 >> 200-275 >> 191-267 - bedtime: 200s >> 159-232 >> 101-204 >> n/c >> 77, 193, 205 >> 75, 134, 230 >> n/c >> 261-287 Lowest: 54 - (2h after she took insulin and did not eat!), 79 >> 76.  Has hypoglycemia in the 70's.    Meals: - Breakfast: egg, bacon, toast; grits; biscuit; fruit; sometimes skips - Lunch: 1/2 PB sandwich or soup and yoghurt, sometimes grackers - Dinner: meat + vegetables + some starch - Snacks: fruit   - last eye exam in 01/24/2014(dr Rankin): no DR, no glaucoma  - No CKD. She is on Enalapril. BUN  Date Value Ref Range Status  05/30/2014 15 6 - 23 mg/dL Final   CREATININE, SER  Date Value Ref Range Status  05/30/2014 0.67 0.40 - 1.20 mg/dL Final  - Has numbness and tingling in feet - on Gabapentin and Amitriptyline. - no hyperlipidemia: Lab Results  Component Value Date   CHOL 163 05/30/2014   HDL 53.70 05/30/2014   LDLCALC 91 05/30/2014   TRIG 92.0 05/30/2014   CHOLHDL 3 05/30/2014  She is on Mevacor.  Review  of Systems Constitutional:+ weight gain, + fatigue, + poor sleep, + hot flushes Eyes: no blurry vision, no xerophthalmia ENT: + sore throat, no nodules palpated in throat, no dysphagia/odynophagia, + hoarseness Cardiovascular: no CP/SOB/palpitations/+ leg swelling Respiratory: + cough/+ SOB Gastrointestinal: no N/V/D/C/heartburn Musculoskeletal: no muscle/joint aches Skin: no rashes  I reviewed pt's medications, allergies, PMH, social hx, family hx, and changes were documented in the history of present illness. Otherwise, unchanged from my initial visit note.  Objective:   Physical Exam BP 118/78 mmHg  Pulse 103  Temp(Src) 98.2 F (36.8 C) (Oral)  Resp 12  Wt 207 lb 12.8 oz (94.257 kg)  SpO2 97% Body mass index is 34.04 kg/(m^2). Wt Readings from Last 3 Encounters:  02/02/15 207 lb 12.8 oz (94.257 kg)  12/26/14 206 lb 4.8 oz (93.577 kg)  05/30/14 201 lb 14.4 oz (91.581 kg)    Constitutional: overweight, in NAD Eyes: PERRLA, EOMI, no exophthalmos ENT: moist mucous membranes, no thyromegaly, no cervical lymphadenopathy Cardiovascular: tachycardia, RR, No MRG Respiratory: CTA B Gastrointestinal: abdomen soft, NT, ND, BS+ Musculoskeletal: strength intact in all 4; missing right toes: 2,3,4; L foot in boot Skin: moist, warm, no rashes  Assessment:     1. DM2, uncontrolled, insulin-dependent, with complications - diabetic peripheral neuropathy - 3 toe amputations - after infected diabetic toe ulcers >> OM:  R 2nd toe amputated on 12/28/2011  R 3rd toe amputated on 04/25/2012  R 4th toe amputated on 07/27/2012  2. PN related to DM    Plan:     1. DM2 Patient with long-standing diabetes, again returning after a long absence. She developed worsened sugars during a gastroenteritis and sugars remain high despite increasing N and R doses. Will need to increase them further. - I advised her to: Patient Instructions   Please continue: - Metformin 1000 mg 2x a day  Please change ReliOn N (long acting) and R (short acting) insulins as follows: Insulin Before breakfast Before lunch Before dinner  Regular  14 units with a small meal  16 units with a regular meal  18 units with a large meal  14 units with a small meal  16 units with a regular meal  18 units with a large meal  14 units with a small meal  16 units with a regular meal  18 units with a large meal  NPH 20 x 25    If you have a snack at night, please add 4-5 units on Regular (R) insulin.  Please return in 1.5 months with your sugar log.  - checked HbA1c today >> 8.7% (same, high) - she is UTD with eye exams - return to clinic in 1.5 months with her sugar log  2. Diabetic PN - allodynia at night in L 2-5th toes >> will increase Neurontin at bedtime to 600 mg - refilled Amitriptyline

## 2015-02-02 NOTE — Telephone Encounter (Signed)
Medication verification to [facility].

## 2015-02-13 DIAGNOSIS — E1142 Type 2 diabetes mellitus with diabetic polyneuropathy: Secondary | ICD-10-CM | POA: Diagnosis not present

## 2015-02-13 DIAGNOSIS — L97411 Non-pressure chronic ulcer of right heel and midfoot limited to breakdown of skin: Secondary | ICD-10-CM | POA: Diagnosis not present

## 2015-02-14 ENCOUNTER — Encounter: Payer: Self-pay | Admitting: Internal Medicine

## 2015-02-16 ENCOUNTER — Other Ambulatory Visit: Payer: Self-pay | Admitting: Internal Medicine

## 2015-02-16 ENCOUNTER — Other Ambulatory Visit: Payer: Self-pay | Admitting: Gastroenterology

## 2015-02-20 ENCOUNTER — Ambulatory Visit: Payer: Medicare Other | Admitting: Internal Medicine

## 2015-02-26 ENCOUNTER — Encounter: Payer: Self-pay | Admitting: Family Medicine

## 2015-02-26 DIAGNOSIS — E119 Type 2 diabetes mellitus without complications: Secondary | ICD-10-CM | POA: Diagnosis not present

## 2015-02-26 DIAGNOSIS — H43811 Vitreous degeneration, right eye: Secondary | ICD-10-CM | POA: Diagnosis not present

## 2015-02-26 DIAGNOSIS — H40003 Preglaucoma, unspecified, bilateral: Secondary | ICD-10-CM | POA: Diagnosis not present

## 2015-02-26 DIAGNOSIS — H2513 Age-related nuclear cataract, bilateral: Secondary | ICD-10-CM | POA: Diagnosis not present

## 2015-02-26 LAB — HM DIABETES EYE EXAM

## 2015-03-13 DIAGNOSIS — E1142 Type 2 diabetes mellitus with diabetic polyneuropathy: Secondary | ICD-10-CM | POA: Diagnosis not present

## 2015-03-13 DIAGNOSIS — L97411 Non-pressure chronic ulcer of right heel and midfoot limited to breakdown of skin: Secondary | ICD-10-CM | POA: Diagnosis not present

## 2015-03-13 DIAGNOSIS — B351 Tinea unguium: Secondary | ICD-10-CM | POA: Diagnosis not present

## 2015-03-19 ENCOUNTER — Ambulatory Visit (INDEPENDENT_AMBULATORY_CARE_PROVIDER_SITE_OTHER): Payer: Medicare Other | Admitting: Internal Medicine

## 2015-03-19 ENCOUNTER — Encounter: Payer: Self-pay | Admitting: Internal Medicine

## 2015-03-19 VITALS — BP 120/78 | HR 110 | Temp 97.6°F | Resp 12 | Wt 212.0 lb

## 2015-03-19 DIAGNOSIS — E1151 Type 2 diabetes mellitus with diabetic peripheral angiopathy without gangrene: Secondary | ICD-10-CM

## 2015-03-19 DIAGNOSIS — E1165 Type 2 diabetes mellitus with hyperglycemia: Secondary | ICD-10-CM

## 2015-03-19 DIAGNOSIS — IMO0002 Reserved for concepts with insufficient information to code with codable children: Secondary | ICD-10-CM

## 2015-03-19 MED ORDER — INSULIN NPH (HUMAN) (ISOPHANE) 100 UNIT/ML ~~LOC~~ SUSP: SUBCUTANEOUS | Status: DC

## 2015-03-19 NOTE — Patient Instructions (Signed)
Please continue: - Metformin 1000 mg 2x a day  Please change ReliOn N (long acting) and R (short acting) insulins as follows: Insulin Before breakfast Before lunch Before dinner  Regular  16 units with a small meal  18 units with a regular meal  20 units with a regular meal  12 units with a small meal  14 units with a regular meal  16 units with a large meal  14 units with a small meal  16 units with a regular meal  18 units with a large meal  NPH 20 x 25 >> 30    If you have a snack at night, please add 4-5 units on Regular (R) insulin.  Please return in 1.5 months with your sugar log.

## 2015-03-19 NOTE — Progress Notes (Signed)
Subjective:     Patient ID: Beth Holden, female   DOB: [birth year], 70 y.o.   MRN: [id number]  HPI Beth Holden is a pleasant 70 y.o. 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 Last visit 1.5 mo ago.  She had UTIs and a GI virus and her aunt had a stroke and she is a caregiver.   Last HbA1C was: Lab Results  Component Value Date   HGBA1C 8.7 02/02/2015   HGBA1C 8.7* 02/28/2014   HGBA1C 7.3* 07/19/2013   She was on the following meds, but she was in the doughnut hole >> we stopped in 04/2013: - Metformin 1000 mg bid - Levemir 47 units in am and 37 units in pm >> $800 for 3 mo supply - Januvia 100 mg daily - $450 for 3 mo - Invokana 100 mg - $455 for 3 mo We discussed about starting her on a VGo mechanical pump in the past. She had an appointment with diabetes education but decided not to pursue it.  She is now on: - Metformin 1000 mg bid - ReliOn N (long acting) and R (short acting) insulins as follows:  Insulin Before breakfast Before lunch Before dinner  Regular  14 units with a small meal  16 units with a regular meal  18 units with a large meal  14 units with a small meal  16 units with a regular meal  18 units with a large meal  14 units with a small meal  16 units with a regular meal  18 units with a large meal  NPH 20 x 25    If you have a snack at night, please add 4-5 units on Regular (R) insulin.   She checks her sugars ~3x a day - per meter: - am: 83-155 >> 71-197 >> 82-157 >> 96-170 (201) >> 83-140 (207) >> 90, 150-190, 230 >> 167-308 >> 99,  112-201, 225 - 2h after b'fast 141 >> 145-177 >> 122-185 >> n/c >> 141-183 (248) >> 131-180 (235 x1) >> 150-175 >> n/c >> 157-261 - prelunch: 80-124 >> 120-184 >> n/c >> 106, 157 >> 164 >> n/c >> 120, 157, 208, 283 - 2-3h after lunch: 150-200 >> 92-175 >> 48 x1 (skipped lunch)  91-101, 218 >> 200-250 >> 76-204 >> 143-233 - before dinner: 75-166 >> 94-151 >> n/c >> 116, 139 >> 57-136 >> 150-175 >> 205-212 >> 60, 77-184 - after dinner: <200 >> 196 >> 208-318 >> 180-225 >> 123, 141, 240 >> 78-134 >> 200-275 >> 191-267 >> 96, 154-240 - bedtime: 200s >> 159-232 >> 101-204 >> n/c >> 77, 193, 205 >> 75, 134, 230 >> n/c >> 261-287 >> 91-169, 236 Lowest: 54 - (2h after she took insulin and did not eat!), 79 >> 76 >> 60.  Has hypoglycemia in the 70's.    Meals: - Breakfast: egg, bacon, toast; grits; biscuit; fruit; sometimes skips - Lunch: 1/2 PB sandwich or soup and yoghurt, sometimes grackers - Dinner: meat + vegetables + some starch - Snacks: fruit   - last eye exam in 02/26/2015 (dr Zadie Rhine): no DR, no glaucoma  - No CKD. She is on Enalapril. BUN  Date Value Ref Range Status  05/30/2014 15 6 - 23 mg/dL Final   CREATININE, SER  Date Value Ref Range Status  05/30/2014 0.67 0.40 - 1.20 mg/dL Final  - Has numbness and tingling in feet - on  and off Gabapentin and is on Amitriptyline. - no hyperlipidemia: Lab Results  Component Value Date   CHOL 163 05/30/2014   HDL 53.70 05/30/2014   LDLCALC 91 05/30/2014   TRIG 92.0 05/30/2014   CHOLHDL 3 05/30/2014  She is on Mevacor.  Review of Systems Constitutional: no weight gain, no fatigue, + poor sleep Eyes: + blurry vision, no xerophthalmia ENT: no sore throat, no nodules palpated in throat, no dysphagia/odynophagia, nohoarseness Cardiovascular: no CP/SOB/palpitations/+ leg swelling Respiratory: + cough/+ SOB Gastrointestinal: no N/V/D/+ C/no heartburn Musculoskeletal: no muscle/joint aches Skin: + rash - small maculo-papular, itching  I reviewed pt's medications, allergies, PMH, social hx, family hx, and changes were documented in the history of present illness. Otherwise, unchanged from my initial visit note.  Objective:   Physical Exam BP 120/78 mmHg  Pulse 110  Temp(Src) 97.6 F (36.4 C) (Oral)  Resp 12   Wt 212 lb (96.163 kg)  SpO2 95% Body mass index is 34.73 kg/(m^2). Wt Readings from Last 3 Encounters:  03/19/15 212 lb (96.163 kg)  02/02/15 207 lb 12.8 oz (94.257 kg)  12/26/14 206 lb 4.8 oz (93.577 kg)   Constitutional: overweight, in NAD Eyes: PERRLA, EOMI, no exophthalmos ENT: moist mucous membranes, no thyromegaly, no cervical lymphadenopathy Cardiovascular: tachycardia, RR, No MRG, + pitting edema B legs Respiratory: CTA B Gastrointestinal: abdomen soft, NT, ND, BS+ Musculoskeletal: strength intact in all 4; missing right toes: 2,3,4; L foot in boot Skin: moist, warm, no rashes  Assessment:     1. DM2, uncontrolled, insulin-dependent, with complications - diabetic peripheral neuropathy - 3 toe amputations - after infected diabetic toe ulcers >> OM:  R 2nd toe amputated on 12/28/2011  R 3rd toe amputated on 04/25/2012  R 4th toe amputated on 07/27/2012    Plan:     1. DM2 Patient with long-standing diabetes, now with slightly better sugars after increasing the insulin doses at last visit. Sugars still high especially after b'fast >> will increase R insulin before this meal and low sugars before dinner >> will decrease insulin with lunch. Will also increase N insulin at night to improve am sugars - I advised her to: Patient Instructions   Please continue: - Metformin 1000 mg 2x a day  Please change ReliOn N (long acting) and R (short acting) insulins as follows: Insulin Before breakfast Before lunch Before dinner  Regular  16 units with a small meal  18 units with a regular meal  20 units with a regular meal  12 units with a small meal  14 units with a regular meal  16 units with a large meal  14 units with a small meal  16 units with a regular meal  18 units with a large meal  NPH 20 x 25 >> 30    If you have a snack at night, please add 4-5 units on Regular (R) insulin.  Please return in 1.5 months with your sugar log.   - she is UTD with eye  exams - return to clinic in 3 months with her sugar log

## 2015-03-24 ENCOUNTER — Other Ambulatory Visit: Payer: Self-pay | Admitting: Internal Medicine

## 2015-03-26 ENCOUNTER — Other Ambulatory Visit: Payer: Self-pay | Admitting: Internal Medicine

## 2015-04-01 ENCOUNTER — Encounter: Payer: Self-pay | Admitting: Internal Medicine

## 2015-04-16 DIAGNOSIS — E1142 Type 2 diabetes mellitus with diabetic polyneuropathy: Secondary | ICD-10-CM | POA: Diagnosis not present

## 2015-04-16 DIAGNOSIS — L97411 Non-pressure chronic ulcer of right heel and midfoot limited to breakdown of skin: Secondary | ICD-10-CM | POA: Diagnosis not present

## 2015-04-16 DIAGNOSIS — B351 Tinea unguium: Secondary | ICD-10-CM | POA: Diagnosis not present

## 2015-04-25 ENCOUNTER — Other Ambulatory Visit: Payer: Self-pay | Admitting: Family Medicine

## 2015-04-28 ENCOUNTER — Encounter: Payer: Self-pay | Admitting: Gastroenterology

## 2015-04-30 DIAGNOSIS — R3915 Urgency of urination: Secondary | ICD-10-CM | POA: Diagnosis not present

## 2015-04-30 DIAGNOSIS — Z0000 Encounter for general adult medical examination without abnormal findings: Secondary | ICD-10-CM | POA: Diagnosis not present

## 2015-05-12 ENCOUNTER — Other Ambulatory Visit: Payer: Self-pay | Admitting: Internal Medicine

## 2015-05-14 DIAGNOSIS — E1142 Type 2 diabetes mellitus with diabetic polyneuropathy: Secondary | ICD-10-CM | POA: Diagnosis not present

## 2015-05-14 DIAGNOSIS — B351 Tinea unguium: Secondary | ICD-10-CM | POA: Diagnosis not present

## 2015-05-14 DIAGNOSIS — L97411 Non-pressure chronic ulcer of right heel and midfoot limited to breakdown of skin: Secondary | ICD-10-CM | POA: Diagnosis not present

## 2015-05-23 ENCOUNTER — Other Ambulatory Visit: Payer: Self-pay | Admitting: Internal Medicine

## 2015-05-28 ENCOUNTER — Other Ambulatory Visit: Payer: Self-pay | Admitting: Gastroenterology

## 2015-06-07 ENCOUNTER — Other Ambulatory Visit: Payer: Self-pay | Admitting: Gastroenterology

## 2015-06-10 ENCOUNTER — Other Ambulatory Visit: Payer: Self-pay | Admitting: Gastroenterology

## 2015-06-11 DIAGNOSIS — L97411 Non-pressure chronic ulcer of right heel and midfoot limited to breakdown of skin: Secondary | ICD-10-CM | POA: Diagnosis not present

## 2015-06-19 ENCOUNTER — Telehealth: Payer: Self-pay | Admitting: Gastroenterology

## 2015-06-19 MED ORDER — OMEPRAZOLE 20 MG PO CPDR: 40.0000 mg | DELAYED_RELEASE_CAPSULE | Freq: Two times a day (BID) | ORAL | Status: DC

## 2015-06-19 NOTE — Telephone Encounter (Signed)
Prescription sent to the pharmacy. Informed patient to keep appointment for any further refills.

## 2015-07-02 ENCOUNTER — Ambulatory Visit: Payer: Medicare Other | Admitting: Internal Medicine

## 2015-07-10 DIAGNOSIS — L97411 Non-pressure chronic ulcer of right heel and midfoot limited to breakdown of skin: Secondary | ICD-10-CM | POA: Diagnosis not present

## 2015-07-10 DIAGNOSIS — B351 Tinea unguium: Secondary | ICD-10-CM | POA: Diagnosis not present

## 2015-07-10 DIAGNOSIS — E1142 Type 2 diabetes mellitus with diabetic polyneuropathy: Secondary | ICD-10-CM | POA: Diagnosis not present

## 2015-07-30 ENCOUNTER — Other Ambulatory Visit: Payer: Self-pay | Admitting: Internal Medicine

## 2015-07-30 ENCOUNTER — Other Ambulatory Visit: Payer: Self-pay | Admitting: Gastroenterology

## 2015-07-30 ENCOUNTER — Other Ambulatory Visit: Payer: Self-pay | Admitting: Family Medicine

## 2015-07-30 ENCOUNTER — Telehealth: Payer: Self-pay | Admitting: Gastroenterology

## 2015-07-30 MED ORDER — OMEPRAZOLE 20 MG PO CPDR: 40.0000 mg | DELAYED_RELEASE_CAPSULE | Freq: Two times a day (BID) | ORAL | Status: DC

## 2015-07-30 NOTE — Telephone Encounter (Signed)
Informed patient that I sent the prescription back in June with one refill but I will send the prescription again. Informed patient if she has any issues to call our office back. Patient verbalized understanding.

## 2015-08-13 ENCOUNTER — Encounter: Payer: Self-pay | Admitting: Gastroenterology

## 2015-08-13 ENCOUNTER — Ambulatory Visit (INDEPENDENT_AMBULATORY_CARE_PROVIDER_SITE_OTHER): Payer: Medicare Other | Admitting: Gastroenterology

## 2015-08-13 VITALS — BP 130/60 | HR 108 | Ht 65.5 in | Wt 214.8 lb

## 2015-08-13 DIAGNOSIS — R05 Cough: Secondary | ICD-10-CM | POA: Diagnosis not present

## 2015-08-13 DIAGNOSIS — K219 Gastro-esophageal reflux disease without esophagitis: Secondary | ICD-10-CM | POA: Diagnosis not present

## 2015-08-13 DIAGNOSIS — Z86010 Personal history of colonic polyps: Secondary | ICD-10-CM

## 2015-08-13 DIAGNOSIS — R059 Cough, unspecified: Secondary | ICD-10-CM

## 2015-08-13 MED ORDER — OMEPRAZOLE 20 MG PO CPDR: 40.0000 mg | DELAYED_RELEASE_CAPSULE | Freq: Two times a day (BID) | ORAL | 3 refills | Status: DC

## 2015-08-13 NOTE — Progress Notes (Signed)
    History of Present Illness: This is a 70 year old female with GERD and a chronic cough. EGD in 02/2014 was normal. Pulm consult in 02/2014 diagnosed upper airway cough syndrome. Her GERD is generally well controlled but she had break through symptoms for several days a few weeks ago that resolved with dietary adjustments.  Current Medications, Allergies, Past Medical History, Past Surgical History, Family History and Social History were reviewed in [employer] record.  Physical Exam: General: Well developed, well nourished, no acute distress Head: Normocephalic and atraumatic Eyes:  sclerae anicteric, EOMI Ears: Normal auditory acuity Mouth: No deformity or lesions Lungs: Clear throughout to auscultation Heart: Regular rate and rhythm; no murmurs, rubs or bruits Abdomen: Soft, non tender and non distended. No masses, hepatosplenomegaly or hernias noted. Normal Bowel sounds Musculoskeletal: Symmetrical with no gross deformities  Pulses:  Normal pulses noted Extremities: No clubbing, cyanosis, edema or deformities noted Neurological: Alert oriented x 4, grossly nonfocal Psychological:  Alert and cooperative. Normal mood and affect  Assessment and Recommendations:  1. GERD. Continue omeprazole 40 mg twice daily and standard antireflux measures.  2. Chronic cough. This has not improved with treatment of GERD. She has been diagnosed with upper airway cough syndrome. Pulm and allergy follow up.  3. Personal history of adenomatous colon polyps. Five-year interval surveillance colonoscopy is recommended in 12/2017.

## 2015-08-13 NOTE — Patient Instructions (Signed)
We have sent the following medications to your pharmacy for you to pick up at your convenience: omeprazole.   Normal BMI (Body Mass Index- based on height and weight) is between 23 and 30. Your BMI today is Body mass index is 35.2 kg/m. Marland Kitchen Please consider follow up  regarding your BMI with your Primary Care Provider.  Thank you for choosing me and [facility].  Pricilla Riffle. Dagoberto Ligas., MD., Mar[facility] Regal

## 2015-08-19 DIAGNOSIS — J029 Acute pharyngitis, unspecified: Secondary | ICD-10-CM | POA: Diagnosis not present

## 2015-08-19 DIAGNOSIS — J069 Acute upper respiratory infection, unspecified: Secondary | ICD-10-CM | POA: Diagnosis not present

## 2015-08-19 DIAGNOSIS — E119 Type 2 diabetes mellitus without complications: Secondary | ICD-10-CM | POA: Diagnosis not present

## 2015-08-20 DIAGNOSIS — L97411 Non-pressure chronic ulcer of right heel and midfoot limited to breakdown of skin: Secondary | ICD-10-CM | POA: Diagnosis not present

## 2015-09-01 ENCOUNTER — Ambulatory Visit (INDEPENDENT_AMBULATORY_CARE_PROVIDER_SITE_OTHER): Payer: Medicare Other | Admitting: Family Medicine

## 2015-09-01 ENCOUNTER — Encounter: Payer: Self-pay | Admitting: Family Medicine

## 2015-09-01 VITALS — BP 110/70 | HR 112 | Temp 98.1°F | Ht 65.5 in | Wt 213.8 lb

## 2015-09-01 DIAGNOSIS — N760 Acute vaginitis: Secondary | ICD-10-CM | POA: Diagnosis not present

## 2015-09-01 DIAGNOSIS — I10 Essential (primary) hypertension: Secondary | ICD-10-CM | POA: Diagnosis not present

## 2015-09-01 DIAGNOSIS — M79675 Pain in left toe(s): Secondary | ICD-10-CM

## 2015-09-01 MED ORDER — FLUCONAZOLE 150 MG PO TABS: 150.0000 mg | ORAL_TABLET | Freq: Once | ORAL | 0 refills | Status: AC

## 2015-09-01 NOTE — Progress Notes (Signed)
HPI:   Here for an acute visit for several issues:  ? Gout L great toe: -acute swelling, pain and redness of fist MCP jt 5 days ago, resolved on its own -wonders if was gout as thinks had episode in the past -denies: swelling other joints, fevers, malaise, pain here today  Yeast vaginitis: -took abx for pharyngitis recently -now with vulvovaginal pruritis  -reports OTC options do not work and wants diflucan  DM -reports has appt with endo in a few weeks and prefers to check diabetes labs there ROS: See pertinent positives and negatives per HPI.  Past Medical History:  Diagnosis Date  . Alopecia   . Anemia, mild   . Chronic cough    sees pulmonologist  . Chronic pain    chest wall and abd - s/p extensive eval  . Diabetes mellitus with neuropathy ([place])    sees endocrine  . Diverticulosis   . GERD (gastroesophageal reflux disease)    takes Nexium bid, hx erosive esophagitis  . Headache(784.0)    occasionally;r/t sinus   . History of colon polyps   . HTN (hypertension)   . Hx of amputation of lesser toe ([place])    sees podiatrist  . Hyperlipemia   . IBS (irritable bowel syndrome)   . Insomnia    takes Elavil nightly  . Joint pain   . Osteomyelitis ([place])   . Seasonal allergies    takes Zyrtec daily    Past Surgical History:  Procedure Laterality Date  . AMPUTATION  12/28/2011   Procedure: AMPUTATION DIGIT;  Surgeon: Newt Minion, MD;  Location: [place];  Service: Orthopedics;  Laterality: Right;  Right Foot 2nd Toe Amputation at MTP (metatarsophalangeal joint)  . AMPUTATION Right 04/25/2012   Procedure: Right Foot 3rd Toe Amputation;  Surgeon: Newt Minion, MD;  Location: [place];  Service: Orthopedics;  Laterality: Right;  Right Foot Third Toe Amputation   . AMPUTATION Right 07/27/2012   Procedure: Right 4th Toe Amputation at Metatarsophalangeal;  Surgeon: Newt Minion, MD;  Location: [place];  Service: Orthopedics;  Laterality: Right;  Right 4th Toe Amputation at  Metatarsophalangeal  . COLONOSCOPY    . LAPAROSCOPIC APPENDECTOMY  01/05/2011   Procedure: APPENDECTOMY LAPAROSCOPIC;  Surgeon: Pedro Earls, MD;  Location: [facility];  Service: General;  Laterality: N/A;  . OOPHORECTOMY  2001  . ROTATOR CUFF REPAIR Right   . TUBAL LIGATION    . VAGINAL HYSTERECTOMY  2001    Family History  Problem Relation Age of Onset  . Lung cancer Mother 51    smoked heavily  . Emphysema Mother   . Hypertension Father   . Hyperlipidemia Father   . Diabetes Father   . Coronary artery disease Father   . Dementia Father   . COPD Father     smoked  . Stomach cancer Paternal Aunt   . Brain cancer Paternal Uncle   . Irritable bowel syndrome      Several family members on fathers side   . Diabetes Other   . Stomach cancer Maternal Aunt   . Colon cancer Neg Hx     Social History   Social History  . Marital status: Married    Spouse name: N/A  . Number of children: 2  . Years of education: N/A   Occupational History  . Retired     Social History Main Topics  . Smoking status: Never Smoker  . Smokeless tobacco: Never Used  . Alcohol use No  .  Drug use: No  . Sexual activity: Yes    Birth control/ protection: Surgical   Other Topics Concern  . None   Social History Narrative   Caffeine daily    [employer], [employer] no diploma   Married '66   1 dtr- '78; 1 son '71; 2 grandchildren   Occupation: retired 04   Dad with alzheimers-had to place in [place] (summer '10)           Current Outpatient Prescriptions:  .  acetaminophen (TYLENOL) 500 MG tablet, Take 500 mg by mouth every 6 (six) hours as needed for mild pain. , Disp: , Rfl:  .  amitriptyline (ELAVIL) 25 MG tablet, Take 1 tablet (25 mg total) by mouth at bedtime., Disp: 90 tablet, Rfl: 3 .  cetirizine (ZYRTEC) 10 MG tablet, Take 10 mg by mouth daily., Disp: , Rfl:  .  enalapril (VASOTEC) 20 MG tablet, Take 20 mg by mouth daily., Disp: , Rfl:  .  Fluocinolone Acetonide (DERMOTIC) 0.01 % OIL, Place 1  drop in ear(s) daily as needed (itching). , Disp: , Rfl:  .  gabapentin (NEURONTIN) 300 MG capsule, Take 2 tablets (600mg  total) by mouth at bedtime., Disp: 180 capsule, Rfl: 3 .  insulin regular (NOVOLIN R,HUMULIN R) 100 units/mL injection, Inject 0.12-0.18 mLs (12-18 Units total) into the skin 3 (three) times daily before meals. Sliding scale (Patient taking differently: Inject 14-16 Units into the skin 3 (three) times daily before meals. Sliding scale), Disp: 20 mL, Rfl: 2 .  losartan (COZAAR) 25 MG tablet, TAKE ONE TABLET BY MOUTH ONCE DAILY, Disp: 90 tablet, Rfl: 0 .  lovastatin (MEVACOR) 20 MG tablet, TAKE ONE TABLET BY MOUTH AT BEDTIME, Disp: 90 tablet, Rfl: 0 .  metFORMIN (GLUCOPHAGE) 1000 MG tablet, TAKE 1 TABLET TWICE A DAY WITH A MEAL, Disp: 180 tablet, Rfl: 0 .  Multiple Vitamin (MULTIVITAMIN WITH MINERALS) TABS tablet, Take 1 tablet by mouth daily., Disp: , Rfl:  .  NOVOLIN R RELION 100 UNIT/ML injection, INJECT 8 TO 12 UNITS INTO THE SKIN THREE TIMES DAILY BEFORE MEALS, Disp: 20 mL, Rfl: 3 .  omeprazole (PRILOSEC) 20 MG capsule, Take 2 capsules (40 mg total) by mouth 2 (two) times daily., Disp: 180 capsule, Rfl: 3 .  ONE TOUCH ULTRA TEST test strip, USE TO TEST BLOOD SUGAR THREE TIMES DAILY AS INSTRUCTED., Disp: 300 each, Rfl: 1 .  RELION INSULIN SYRINGE 1ML/31G 31G X 5/16" 1 ML MISC, USE 2 TIMES A DAY, Disp: 100 each, Rfl: 4 .  fluconazole (DIFLUCAN) 150 MG tablet, Take 1 tablet (150 mg total) by mouth once. May repeat in 1 week if needed., Disp: 2 tablet, Rfl: 0  EXAM:  Vitals:   09/01/15 1541  BP: 110/70  Pulse: (!) 112  Temp: 98.1 F (36.7 C)    Body mass index is 35.04 kg/m.  GENERAL: vitals reviewed and listed above, alert, oriented, appears well hydrated and in no acute distress  HEENT: atraumatic, conjunttiva clear, no obvious abnormalities on inspection of external nose and ears  NECK: no obvious masses on inspection  LUNGS: clear to auscultation bilaterally, no  wheezes, rales or rhonchi, good air movement  CV: HRRR, tr bilat ankle peripheral edema  MS: moves all extremities without noticeable abnormality, no erythema, swelling or redness of the foot or toe today. Tr bilateral ankle edema  PSYCH: pleasant and cooperative, no obvious depression or anxiety  ASSESSMENT AND PLAN:  Discussed the following assessment and plan:  Pain of toe of  left foot - Plan: Uric Acid -sounds like possible gout versus OA -will check uric acid, f/u if recurs  Vulvovaginitis -diflucan sent -advised f/u if persists  Essential hypertension - Plan: Basic metabolic panel, CBC (no diff)  -Patient advised to return or notify a doctor immediately if symptoms worsen or persist or new concerns arise.  Patient Instructions  BEFORE YOU LEAVE: -follow up: medicare exam with Dr. Maudie Mercury in 3 months -labs  We have ordered labs or studies at this visit. It can take up to 1-2 weeks for results and processing. IF results require follow up or explanation, we will call you with instructions. Clinically stable results will be released to your [facility]. If you have not heard from [place] or cannot find your results in [facility] in 2 weeks please contact our office at [phone].  If you are not yet signed up for [facility], please consider signing up.  I sent Diflucan to the pharmacy for the yeast infection. Please follow up sooner if this does not help.           Colin Benton R., DO  \

## 2015-09-01 NOTE — Progress Notes (Signed)
Pre visit review using our clinic review tool, if applicable. No additional management support is needed unless otherwise documented below in the visit note. 

## 2015-09-01 NOTE — Patient Instructions (Signed)
BEFORE YOU LEAVE: -follow up: medicare exam with Dr. Maudie Mercury in 3 months -labs  We have ordered labs or studies at this visit. It can take up to 1-2 weeks for results and processing. IF results require follow up or explanation, we will call you with instructions. Clinically stable results will be released to your [facility]. If you have not heard from [place] or cannot find your results in [facility] in 2 weeks please contact our office at [phone].  If you are not yet signed up for [facility], please consider signing up.  I sent Diflucan to the pharmacy for the yeast infection. Please follow up sooner if this does not help.

## 2015-09-02 ENCOUNTER — Telehealth: Payer: Self-pay | Admitting: Family Medicine

## 2015-09-02 LAB — BASIC METABOLIC PANEL
BUN: 15 mg/dL (ref 6–23)
CO2: 32 mEq/L (ref 19–32)
Calcium: 9.4 mg/dL (ref 8.4–10.5)
Chloride: 99 mEq/L (ref 96–112)
Creatinine, Ser: 0.88 mg/dL (ref 0.40–1.20)
GFR: 67.94 mL/min (ref >60.00)
Glucose, Bld: 167 mg/dL — ABNORMAL HIGH (ref 70–99)
Potassium: 4.1 mEq/L (ref 3.5–5.1)
Sodium: 138 mEq/L (ref 135–145)

## 2015-09-02 LAB — CBC
HCT: 34.7 % — ABNORMAL LOW (ref 36.0–46.0)
Hemoglobin: 11.2 g/dL — ABNORMAL LOW (ref 12.0–15.0)
MCHC: 32.4 g/dL (ref 30.0–36.0)
MCV: 77.4 fl — ABNORMAL LOW (ref 78.0–100.0)
Platelets: 490 10*3/uL — ABNORMAL HIGH (ref 150.0–400.0)
RBC: 4.48 Mil/uL (ref 3.87–5.11)
RDW: 15.3 % (ref 11.5–15.5)
WBC: 10.6 10*3/uL — ABNORMAL HIGH (ref 4.0–10.5)

## 2015-09-02 LAB — URIC ACID: Uric Acid, Serum: 5.5 mg/dL (ref 2.4–7.0)

## 2015-09-02 MED ORDER — CETIRIZINE HCL 10 MG PO TABS: 10.0000 mg | ORAL_TABLET | Freq: Every day | ORAL | 1 refills | Status: DC

## 2015-09-02 NOTE — Telephone Encounter (Signed)
Patient seen by PCP on 09/01/15.

## 2015-09-02 NOTE — Telephone Encounter (Signed)
Rx sent in

## 2015-09-02 NOTE — Telephone Encounter (Signed)
Pt needs new rx cetirizine 10 mg #90 w/refills [facility] [address]. [place]

## 2015-09-04 ENCOUNTER — Telehealth: Payer: Self-pay | Admitting: Internal Medicine

## 2015-09-04 ENCOUNTER — Telehealth: Payer: Self-pay

## 2015-09-04 NOTE — Telephone Encounter (Signed)
Called patient to advise of your note from earlier, patient states that is not why she called originally that she had lab draws from 3 days ago and got the results back today (which are in her recent lab work tab in her chart), she states that her PCP advised her to contact us and ask if her WBC or her Platelet Count was high and could be related to diabetic issues. Please advise. Thank you!

## 2015-09-04 NOTE — Telephone Encounter (Signed)
She does have peripheral neuropathy - is she still on neurontin? It may be a good idea to see a neurologist to see if there is anything else to do for this if pain is so severe.  That being said, the current pain may still be caused by gout.Marland KitchenMarland Kitchen

## 2015-09-04 NOTE — Telephone Encounter (Signed)
PT called and said that she went to PCP because she thought that she had Gout in her foot, her PCP said to check with you to see if this was possibly diabetes related or if she should go see a specialist. Requests call back.

## 2015-09-04 NOTE — Telephone Encounter (Signed)
Per review of the chart, her WBC and Platelet count have been high for years. No relationship with diabetes.

## 2015-09-07 ENCOUNTER — Telehealth: Payer: Self-pay

## 2015-09-07 NOTE — Telephone Encounter (Signed)
Called and advised patient that Dr.Gherghe had looked over her chart, and did not find that her WBC or Platelet counts were diabetic related. Patient had no questions or concerns at this time.

## 2015-09-10 ENCOUNTER — Ambulatory Visit: Payer: Medicare Other | Admitting: Internal Medicine

## 2015-09-11 ENCOUNTER — Ambulatory Visit (INDEPENDENT_AMBULATORY_CARE_PROVIDER_SITE_OTHER): Payer: Medicare Other | Admitting: Family Medicine

## 2015-09-11 ENCOUNTER — Encounter: Payer: Self-pay | Admitting: Family Medicine

## 2015-09-11 VITALS — BP 108/60 | HR 102 | Temp 98.3°F | Ht 65.5 in | Wt 213.8 lb

## 2015-09-11 DIAGNOSIS — J0111 Acute recurrent frontal sinusitis: Secondary | ICD-10-CM

## 2015-09-11 MED ORDER — AMOXICILLIN-POT CLAVULANATE 875-125 MG PO TABS
1.0000 | ORAL_TABLET | Freq: Two times a day (BID) | ORAL | 0 refills | Status: DC
Start: 2015-09-11 — End: 2015-11-30

## 2015-09-11 NOTE — Progress Notes (Signed)
Pre visit review using our clinic review tool, if applicable. No additional management support is needed unless otherwise documented below in the visit note. 

## 2015-09-11 NOTE — Patient Instructions (Signed)
Afrin nasal spray for 3 days - if symptoms worsening or persistent start Augmentin.  Follow up as needed and as scheduled.

## 2015-09-11 NOTE — Progress Notes (Signed)
HPI: URI -started: 3 weeks ago, treated with amoxicillin at [facility], improved and then worsened the last few days  -symptoms:nasal congestion, sore throat, cough, sinus pressure, thick discolored nasal congestion -denies:fever, SOB, NVD, tooth pain -has tried: see above -sick contacts/travel/risks: no reported flu, strep or tick exposure -Hx of: allergies, sinusitis treated with augmenting in the past and requesting  ROS: See pertinent positives and negatives per HPI.  Past Medical History:  Diagnosis Date  . Alopecia   . Anemia, mild   . Chronic cough    sees pulmonologist  . Chronic pain    chest wall and abd - s/p extensive eval  . Diabetes mellitus with neuropathy ([place])    sees endocrine  . Diverticulosis   . GERD (gastroesophageal reflux disease)    takes Nexium bid, hx erosive esophagitis  . Headache(784.0)    occasionally;r/t sinus   . History of colon polyps   . HTN (hypertension)   . Hx of amputation of lesser toe ([place])    sees podiatrist  . Hyperlipemia   . IBS (irritable bowel syndrome)   . Insomnia    takes Elavil nightly  . Joint pain   . Osteomyelitis ([place])   . Seasonal allergies    takes Zyrtec daily    Past Surgical History:  Procedure Laterality Date  . AMPUTATION  12/28/2011   Procedure: AMPUTATION DIGIT;  Surgeon: Newt Minion, MD;  Location: [facility];  Service: Orthopedics;  Laterality: Right;  Right Foot 2nd Toe Amputation at MTP (metatarsophalangeal joint)  . AMPUTATION Right 04/25/2012   Procedure: Right Foot 3rd Toe Amputation;  Surgeon: Newt Minion, MD;  Location: [facility];  Service: Orthopedics;  Laterality: Right;  Right Foot Third Toe Amputation   . AMPUTATION Right 07/27/2012   Procedure: Right 4th Toe Amputation at Metatarsophalangeal;  Surgeon: Newt Minion, MD;  Location: [facility];  Service: Orthopedics;  Laterality: Right;  Right 4th Toe Amputation at Metatarsophalangeal  . COLONOSCOPY    . LAPAROSCOPIC APPENDECTOMY  01/05/2011   Procedure:  APPENDECTOMY LAPAROSCOPIC;  Surgeon: Pedro Earls, MD;  Location[facility];  Service: General;  Laterality: N/A;  . OOPHORECTOMY  2001  . ROTATOR CUFF REPAIR Right   . TUBAL LIGATION    . VAGINAL HYSTERECTOMY  2001    Family History  Problem Relation Age of Onset  . Lung cancer Mother 34    smoked heavily  . Emphysema Mother   . Hypertension Father   . Hyperlipidemia Father   . Diabetes Father   . Coronary artery disease Father   . Dementia Father   . COPD Father     smoked  . Stomach cancer Paternal Aunt   . Brain cancer Paternal Uncle   . Irritable bowel syndrome      Several family members on fathers side   . Diabetes Other   . Stomach cancer Maternal Aunt   . Colon cancer Neg Hx     Social History   Social History  . Marital status: Married    Spouse name: N/A  . Number of children: 2  . Years of education: N/A   Occupational History  . Retired     Social History Main Topics  . Smoking status: Never Smoker  . Smokeless tobacco: Never Used  . Alcohol use No  . Drug use: No  . Sexual activity: Yes    Birth control/ protection: Surgical   Other Topics Concern  . None   Social History Narrative  Caffeine daily    [employer], [employer] no diploma   Married '66   1 dtr- '78; 1 son '71; 2 grandchildren   Occupation: retired 04   Dad with alzheimers-had to place in [place] (summer '10)           Current Outpatient Prescriptions:  .  acetaminophen (TYLENOL) 500 MG tablet, Take 500 mg by mouth every 6 (six) hours as needed for mild pain. , Disp: , Rfl:  .  amitriptyline (ELAVIL) 25 MG tablet, Take 1 tablet (25 mg total) by mouth at bedtime., Disp: 90 tablet, Rfl: 3 .  cetirizine (ZYRTEC) 10 MG tablet, Take 1 tablet (10 mg total) by mouth daily., Disp: 90 tablet, Rfl: 1 .  enalapril (VASOTEC) 20 MG tablet, Take 20 mg by mouth daily., Disp: , Rfl:  .  Fluocinolone Acetonide (DERMOTIC) 0.01 % OIL, Place 1 drop in ear(s) daily as needed (itching). , Disp: , Rfl:  .   gabapentin (NEURONTIN) 300 MG capsule, Take 2 tablets (600mg  total) by mouth at bedtime., Disp: 180 capsule, Rfl: 3 .  insulin regular (NOVOLIN R,HUMULIN R) 100 units/mL injection, Inject 0.12-0.18 mLs (12-18 Units total) into the skin 3 (three) times daily before meals. Sliding scale (Patient taking differently: Inject 14-16 Units into the skin 3 (three) times daily before meals. Sliding scale), Disp: 20 mL, Rfl: 2 .  losartan (COZAAR) 25 MG tablet, TAKE ONE TABLET BY MOUTH ONCE DAILY, Disp: 90 tablet, Rfl: 0 .  lovastatin (MEVACOR) 20 MG tablet, TAKE ONE TABLET BY MOUTH AT BEDTIME, Disp: 90 tablet, Rfl: 0 .  metFORMIN (GLUCOPHAGE) 1000 MG tablet, TAKE 1 TABLET TWICE A DAY WITH A MEAL, Disp: 180 tablet, Rfl: 0 .  Multiple Vitamin (MULTIVITAMIN WITH MINERALS) TABS tablet, Take 1 tablet by mouth daily., Disp: , Rfl:  .  NOVOLIN R RELION 100 UNIT/ML injection, INJECT 8 TO 12 UNITS INTO THE SKIN THREE TIMES DAILY BEFORE MEALS, Disp: 20 mL, Rfl: 3 .  omeprazole (PRILOSEC) 20 MG capsule, Take 2 capsules (40 mg total) by mouth 2 (two) times daily., Disp: 180 capsule, Rfl: 3 .  ONE TOUCH ULTRA TEST test strip, USE TO TEST BLOOD SUGAR THREE TIMES DAILY AS INSTRUCTED., Disp: 300 each, Rfl: 1 .  RELION INSULIN SYRINGE 1ML/31G 31G X 5/16" 1 ML MISC, USE 2 TIMES A DAY, Disp: 100 each, Rfl: 4 .  amoxicillin-clavulanate (AUGMENTIN) 875-125 MG tablet, Take 1 tablet by mouth 2 (two) times daily., Disp: 20 tablet, Rfl: 0  EXAM:  Vitals:   09/11/15 1304  BP: 108/60  Pulse: (!) 102  Temp: 98.3 F (36.8 C)    Body mass index is 35.04 kg/m.  GENERAL: vitals reviewed and listed above, alert, oriented, appears well hydrated and in no acute distress  HEENT: atraumatic, conjunttiva clear, no obvious abnormalities on inspection of external nose and ears, normal appearance of ear canals and TMs, thick nasal congestion, mild post oropharyngeal erythema with PND, no tonsillar edema or exudate, no sinus TTP  NECK: no  obvious masses on inspection  LUNGS: clear to auscultation bilaterally, no wheezes, rales or rhonchi, good air movement  CV: HRRR, no peripheral edema  MS: moves all extremities without noticeable abnormality  PSYCH: pleasant and cooperative, no obvious depression or anxiety  ASSESSMENT AND PLAN:  Discussed the following assessment and plan:  Acute recurrent frontal sinusitis  -given HPI and exam findings today, a serious infection or illness is unlikely. We discussed potential etiologies, with VURI versus partially treated sinusitis. We discussed  treatment side effects, likely course, antibiotic misuse, transmission, and signs of developing a serious illness. -wants to try afrin, then augmentin if persists. -of course, we advised to return or notify a doctor immediately if symptoms worsen or persist or new concerns arise.    Patient Instructions  Afrin nasal spray for 3 days - if symptoms worsening or persistent start Augmentin.  Follow up as needed and as scheduled.   Colin Benton R., DO

## 2015-09-25 ENCOUNTER — Telehealth: Payer: Self-pay

## 2015-09-25 NOTE — Telephone Encounter (Signed)
Call regarding AWV. Seeing dr Maudie Mercury in Nov; Requested to wait until she discussed with Dr. Maudie Mercury. Did review o/d health. Educated on DEXA scan and will consider after she sees Dr. Maudie Mercury.

## 2015-09-28 DIAGNOSIS — B351 Tinea unguium: Secondary | ICD-10-CM | POA: Diagnosis not present

## 2015-09-28 DIAGNOSIS — E1142 Type 2 diabetes mellitus with diabetic polyneuropathy: Secondary | ICD-10-CM | POA: Diagnosis not present

## 2015-09-28 DIAGNOSIS — L97411 Non-pressure chronic ulcer of right heel and midfoot limited to breakdown of skin: Secondary | ICD-10-CM | POA: Diagnosis not present

## 2015-10-02 ENCOUNTER — Telehealth: Payer: Self-pay | Admitting: Family Medicine

## 2015-10-02 DIAGNOSIS — R7989 Other specified abnormal findings of blood chemistry: Secondary | ICD-10-CM

## 2015-10-02 DIAGNOSIS — D649 Anemia, unspecified: Secondary | ICD-10-CM

## 2015-10-02 DIAGNOSIS — D72829 Elevated white blood cell count, unspecified: Secondary | ICD-10-CM

## 2015-10-02 NOTE — Telephone Encounter (Signed)
Instead of waiting for her cpe in November to recheck pt's platelets, pt would like to proceed with referral to specialist.

## 2015-10-07 NOTE — Telephone Encounter (Signed)
Ok to refer.

## 2015-10-08 NOTE — Addendum Note (Signed)
Addended by: Agnes Lawrence on: 10/08/2015 11:22 AM   Modules accepted: Orders

## 2015-10-08 NOTE — Telephone Encounter (Signed)
I called the pt and informed her the referral was placed and she stated she did not want to see the hematologist and would prefer to wait until after her visit.  Patient stated she talked with someone about a physical and she did not want to see the nurse for this, only Dr Maudie Mercury and that she left this message before her last appt and did not talk to anyone to tell them this last week.  I spoke with Neoma Laming and she stated she will cancel the referral and informed the pt she is scheduled to see Dr Maudie Mercury only for her physical and she agreed.

## 2015-10-18 ENCOUNTER — Other Ambulatory Visit: Payer: Self-pay | Admitting: Internal Medicine

## 2015-10-19 ENCOUNTER — Other Ambulatory Visit: Payer: Self-pay

## 2015-10-19 MED ORDER — GLUCOSE BLOOD VI STRP: ORAL_STRIP | 1 refills | Status: DC

## 2015-10-19 NOTE — Telephone Encounter (Signed)
Patient need a new prescription for the ONE TOUCH ULTRA TEST test strip send to  [facility], [place] [address] [phone] (Phone) [phone] (Fax)

## 2015-10-26 ENCOUNTER — Ambulatory Visit (INDEPENDENT_AMBULATORY_CARE_PROVIDER_SITE_OTHER): Payer: Medicare Other | Admitting: Internal Medicine

## 2015-10-26 ENCOUNTER — Encounter: Payer: Self-pay | Admitting: Internal Medicine

## 2015-10-26 VITALS — BP 120/72 | HR 101 | Ht 65.5 in | Wt 214.0 lb

## 2015-10-26 DIAGNOSIS — Z23 Encounter for immunization: Secondary | ICD-10-CM | POA: Diagnosis not present

## 2015-10-26 DIAGNOSIS — E1151 Type 2 diabetes mellitus with diabetic peripheral angiopathy without gangrene: Secondary | ICD-10-CM

## 2015-10-26 DIAGNOSIS — E1165 Type 2 diabetes mellitus with hyperglycemia: Secondary | ICD-10-CM

## 2015-10-26 DIAGNOSIS — IMO0002 Reserved for concepts with insufficient information to code with codable children: Secondary | ICD-10-CM

## 2015-10-26 LAB — POCT GLYCOSYLATED HEMOGLOBIN (HGB A1C): Hemoglobin A1C: 9

## 2015-10-26 MED ORDER — INSULIN REGULAR HUMAN 100 UNIT/ML IJ SOLN: INTRAMUSCULAR | 11 refills | Status: DC

## 2015-10-26 MED ORDER — INSULIN NPH (HUMAN) (ISOPHANE) 100 UNIT/ML ~~LOC~~ SUSP: SUBCUTANEOUS | 11 refills | Status: DC

## 2015-10-26 NOTE — Patient Instructions (Addendum)
Please continue: - Metformin 1000 mg 2x a day  Please change ReliOn N (long acting) and R (short acting) insulin doses as follows: Insulin Before breakfast Before lunch Before dinner Snack  Regular  16 units with a small meal  18 units with a regular meal  20 units with a regular meal  12 units with a small meal  14 units with a regular meal  16 units with a large meal  25 units with a smaller meal  30 units with a larger meal  12 units with a snack  NPH 20 x  30   Please return in 3 months with your sugar log.

## 2015-10-26 NOTE — Addendum Note (Signed)
Addended by: Caprice Beaver T on: 10/26/2015 10:57 AM   Modules accepted: Orders

## 2015-10-26 NOTE — Progress Notes (Signed)
Subjective:     Patient ID: Beth Holden, female   DOB: 04/05/1945, 70 y.o.   MRN: [id number]  HPI Beth Holden is a pleasant 70 y.o. 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 Last visit 7 mo ago.  She had repeated courses and ABx for URI, sinusitis - last month, but ongoing sxs.  Last HbA1C was: Lab Results  Component Value Date   HGBA1C 8.7 02/02/2015   HGBA1C 8.7 (H) 02/28/2014   HGBA1C 7.3 (H) 07/19/2013   She was on the following meds, but she was in the doughnut hole >> we stopped in 04/2013: - Metformin 1000 mg bid - Levemir 47 units in am and 37 units in pm >> $800 for 3 mo supply - Januvia 100 mg daily - $450 for 3 mo - Invokana 100 mg - $455 for 3 mo We discussed about starting her on a VGo mechanical pump in the past. She had an appointment with diabetes education but decided not to pursue it.  She is now on: - Metformin 1000 mg bid - ReliOn N (long acting) and R (short acting) insulins as follows: Insulin Before breakfast Before lunch Before dinner Snack  Regular  16 units with a small meal  18 units with a regular meal  20 units with a regular meal  12 units with a small meal  14 units with a regular meal  16 units with a large meal  20 units with a smaller meal  25 units with a larger meal  12 units with a snack  NPH 20 x 25      If you have a snack at night, please add 4-5 units on Regular (R) insulin.   She checks her sugars ~3x a day - per meter: - am: 96-170 (201) >> 83-140 (207) >> 90, 150-190, 230 >> 167-308 >> 99,  112-201, 225 >> 130-325 - 2h after b'fast 1141-183 (248) >> 131-180 (235 x1) >> 150-175 >> n/c >> 157-261 >> 301-349 - prelunch: 80-124 >> 120-184 >> n/c >> 106, 157 >> 164 >> n/c >> 120, 157, 208, 283 >> 69 x1 - 2-3h after lunch: 48 x1 (skipped lunch) 91-101, 218 >> 200-250 >> 76-204 >> 143-233  >> 85, 166 - before dinner: 116, 139 >> 57-136 >> 150-175 >> 205-212 >> 60, 77-184 >> 248, 257 - after dinner: 180-225 >> 123, 141, 240 >> 78-134 >> 200-275 >> 191-267 >> 96, 154-240 >> n/c - bedtime: 77, 193, 205 >> 75, 134, 230 >> n/c >> 261-287 >> 91-169, 236 >> 160, 178, 226, 281 Lowest: 54 - (2h after she took insulin and did not eat!), 79 >> 76 >> 60.  Has hypoglycemia in the 70's.    Meals: - Breakfast: egg, bacon, toast; grits; biscuit; fruit; sometimes skips - Lunch: 1/2 PB sandwich or soup and yoghurt, sometimes grackers - Dinner: meat + vegetables + some starch - Snacks: fruit   - last eye exam in 02/26/2015 (dr Zadie Rhine): no DR, no glaucoma  - No CKD. She is on Enalapril. BUN  Date Value Ref Range Status  09/01/2015 15 6 - 23 mg/dL Final   Creatinine, Ser  Date Value Ref Range Status  09/01/2015 0.88 0.40 - 1.20 mg/dL Final  - Has numbness and tingling in feet - on and off Gabapentin and is on Amitriptyline. - no hyperlipidemia: Lab Results  Component Value Date   CHOL  163 05/30/2014   HDL 53.70 05/30/2014   LDLCALC 91 05/30/2014   TRIG 92.0 05/30/2014   CHOLHDL 3 05/30/2014  She is on Mevacor.  Review of Systems Constitutional: no weight gain, no fatigue, + poor sleep Eyes: + blurry vision, no xerophthalmia ENT: no sore throat, no nodules palpated in throat, no dysphagia/odynophagia, nohoarseness Cardiovascular: no CP/SOB/palpitations/leg swelling Respiratory: + cough/no SOB Gastrointestinal: no N/V/D/C/heartburn Musculoskeletal: no muscle/joint aches Skin: no rash  I reviewed pt's medications, allergies, PMH, social hx, family hx, and changes were documented in the history of present illness. Otherwise, unchanged from my initial visit note.  Objective:   Physical Exam BP 120/72 (BP Location: Left Arm, Patient Position: Sitting)   Pulse (!) 101   Ht 5' 5.5" (1.664 m)   Wt 214 lb (97.1 kg)   SpO2 96%   BMI 35.07 kg/m  Body mass index is 35.07 kg/m. Wt  Readings from Last 3 Encounters:  10/26/15 214 lb (97.1 kg)  09/11/15 213 lb 12.8 oz (97 kg)  09/01/15 213 lb 12.8 oz (97 kg)   Constitutional: overweight, in NAD Eyes: PERRLA, EOMI, no exophthalmos ENT: moist mucous membranes, no thyromegaly, no cervical lymphadenopathy Cardiovascular: tachycardia, RR, No MRG, + pitting edema B legs Respiratory: CTA B Gastrointestinal: abdomen soft, NT, ND, BS+ Musculoskeletal: strength intact in all 4; missing right toes: 2,3,4; L foot in boot Skin: moist, warm, no rashes  Assessment:     1. DM2, uncontrolled, insulin-dependent, with complications - diabetic peripheral neuropathy - 3 toe amputations - after infected diabetic toe ulcers >> OM:  R 2nd toe amputated on 12/28/2011  R 3rd toe amputated on 04/25/2012  R 4th toe amputated on 07/27/2012    Plan:     1. DM2 Patient with long-standing diabetes, returning after a long absence. Her sugars are higher in am >> will increase R insulin before dinner. Will also move N insulin at bedtime and increase the dose to improve am sugars - I advised her to: Patient Instructions   Please continue: - Metformin 1000 mg 2x a day  Please change ReliOn N (long acting) and R (short acting) insulins as follows: Insulin Before breakfast Before lunch Before dinner  Regular  16 units with a small meal  18 units with a regular meal  20 units with a regular meal  12 units with a small meal  14 units with a regular meal  16 units with a large meal  14 units with a small meal  16 units with a regular meal  18 units with a large meal  NPH 20 x 25 >> 30    If you have a snack at night, please add 4-5 units on Regular (R) insulin.  Please return in 1.5 months with your sugar log.   - she is UTD with eye exams - will give her the flu shot today - return to clinic in 3 months with her sugar log  Philemon Kingdom, MD PhD [facility]

## 2015-10-31 ENCOUNTER — Other Ambulatory Visit: Payer: Self-pay | Admitting: Family Medicine

## 2015-11-02 ENCOUNTER — Ambulatory Visit (INDEPENDENT_AMBULATORY_CARE_PROVIDER_SITE_OTHER): Payer: Self-pay | Admitting: Orthopedic Surgery

## 2015-11-05 DIAGNOSIS — N3946 Mixed incontinence: Secondary | ICD-10-CM | POA: Diagnosis not present

## 2015-11-16 ENCOUNTER — Ambulatory Visit (INDEPENDENT_AMBULATORY_CARE_PROVIDER_SITE_OTHER): Payer: Medicare Other | Admitting: Orthopedic Surgery

## 2015-11-16 ENCOUNTER — Encounter (INDEPENDENT_AMBULATORY_CARE_PROVIDER_SITE_OTHER): Payer: Self-pay | Admitting: Orthopedic Surgery

## 2015-11-16 VITALS — Ht 65.5 in | Wt 214.0 lb

## 2015-11-16 DIAGNOSIS — L97511 Non-pressure chronic ulcer of other part of right foot limited to breakdown of skin: Secondary | ICD-10-CM

## 2015-11-16 NOTE — Progress Notes (Signed)
Wound Care Note   [name]           Date of Birth: 1945-02-09           MRN: [id number]             PCP: Lucretia Kern., DO Visit Date: 11/16/2015   Assessment & Plan: Visit Diagnoses: No diagnosis found.  Plan: Follow-up in the office in 3 weeks. Continue use the Dr. Felicie Morn donut pad continue with Silvadene dressing changes.   Follow-Up Instructions: Return in about 3 weeks (around 12/07/2015).  Orders:  No orders of the defined types were placed in this encounter.  No orders of the defined types were placed in this encounter.     Procedures: No notes on file   Clinical Data: No additional findings.   No images are attached to the encounter.   Subjective: Chief Complaint  Patient presents with  . Right Foot - Wound Check  . Wound Check    ulceration plantar aspect of right foot beneath 4th/5th metatarsal heads    Patient presents today for follow up for bilateral lower extremities. She is requesting a nail trim for onychomycotic nails bilaterally. She is also here for ulceration on the plantar aspect of 4th and 3rd metatarsal heads. There is bloody drainage without odor. There is callus build up, she ambulates in sandals today full weightbearing. She is dressing with silvadene and corn pad underneath the ulcer. She has had an increase in swelling, soreness despite her decrease in activity.     Review of Systems   Objective: Vital Signs: Ht 5' 5.5" (1.664 m)   Wt 214 lb (97.1 kg)   BMI 35.07 kg/m   Physical Exam: Patient is alert oriented no adenopathy well-dressed normal affect normal respiratory effort.  Patient does have an antalgic gait  After informed consent a 10 blade knife was used to debride the skin and soft tissue back to bleeding viable granulation tissue silver nitrate was used for hemostasis. The ulcer is larger and is 20 x 40 mm and 1 mm deep there is no abscess no purulence this does not probe to bone or tendon. Band-Aid and  Iodosorb was applied.  Specialty Comments: No specialty comments available.   PMFS History: Patient Active Problem List   Diagnosis Date Noted  . Chest wall pain 03/06/2014  . Upper airway cough syndrome 03/05/2014  . S/P amputation of lesser toe - followed by Dr. Sharol Given 04/15/2013  . Pain in joint, shoulder region, s/p RTC surgery - followed by Dr. Sharol Given 04/15/2013  . ANEMIA, MILD 02/04/2010  . Essential hypertension 12/05/2006  . Uncontrolled type 2 diabetes mellitus with peripheral circulatory disorder ([place]) 10/12/2006  . Hyperlipidemia 10/12/2006  . Allergic rhinitis 10/12/2006  . GERD - Followed by Dr. Fuller Plan 10/12/2006  . IRRITABLE BOWEL SYNDROME - followed by Dr. Fuller Plan 10/12/2006  . Peripheral neuropathy ([place]) 10/11/2006  . ALOPECIA NEC 10/11/2006   Past Medical History:  Diagnosis Date  . Alopecia   . Anemia, mild   . Chronic cough    sees pulmonologist  . Chronic pain    chest wall and abd - s/p extensive eval  . Diabetes mellitus with neuropathy ([place])    sees endocrine  . Diverticulosis   . GERD (gastroesophageal reflux disease)    takes Nexium bid, hx erosive esophagitis  . Headache(784.0)    occasionally;r/t sinus   . History of colon polyps   . HTN (hypertension)   . Hx of amputation  of lesser toe [facility])    sees podiatrist  . Hyperlipemia   . IBS (irritable bowel syndrome)   . Insomnia    takes Elavil nightly  . Joint pain   . Osteomyelitis ([place])   . Seasonal allergies    takes Zyrtec daily    Family History  Problem Relation Age of Onset  . Lung cancer Mother 55    smoked heavily  . Emphysema Mother   . Hypertension Father   . Hyperlipidemia Father   . Diabetes Father   . Coronary artery disease Father   . Dementia Father   . COPD Father     smoked  . Stomach cancer Paternal Aunt   . Brain cancer Paternal Uncle   . Irritable bowel syndrome      Several family members on fathers side   . Diabetes Other   . Stomach cancer Maternal Aunt   .  Colon cancer Neg Hx    Past Surgical History:  Procedure Laterality Date  . AMPUTATION  12/28/2011   Procedure: AMPUTATION DIGIT;  Surgeon: Newt Minion, MD;  Location: [facility];  Service: Orthopedics;  Laterality: Right;  Right Foot 2nd Toe Amputation at MTP (metatarsophalangeal joint)  . AMPUTATION Right 04/25/2012   Procedure: Right Foot 3rd Toe Amputation;  Surgeon: Newt Minion, MD;  Location: [facility];  Service: Orthopedics;  Laterality: Right;  Right Foot Third Toe Amputation   . AMPUTATION Right 07/27/2012   Procedure: Right 4th Toe Amputation at Metatarsophalangeal;  Surgeon: Newt Minion, MD;  Location: [facility];  Service: Orthopedics;  Laterality: Right;  Right 4th Toe Amputation at Metatarsophalangeal  . COLONOSCOPY    . LAPAROSCOPIC APPENDECTOMY  01/05/2011   Procedure: APPENDECTOMY LAPAROSCOPIC;  Surgeon: Pedro Earls, MD;  Location: [facility];  Service: General;  Laterality: N/A;  . OOPHORECTOMY  2001  . ROTATOR CUFF REPAIR Right   . TUBAL LIGATION    . VAGINAL HYSTERECTOMY  2001   Social History   Occupational History  . Retired     Social History Main Topics  . Smoking status: Never Smoker  . Smokeless tobacco: Never Used  . Alcohol use No  . Drug use: No  . Sexual activity: Yes    Birth control/ protection: Surgical

## 2015-11-20 ENCOUNTER — Other Ambulatory Visit: Payer: Self-pay

## 2015-11-20 MED ORDER — OMEPRAZOLE 20 MG PO CPDR: 40.0000 mg | DELAYED_RELEASE_CAPSULE | Freq: Two times a day (BID) | ORAL | 1 refills | Status: DC

## 2015-11-29 NOTE — Progress Notes (Signed)
Medicare Annual Preventive Care Visit  (initial annual wellness or annual wellness exam)  Concerns and/or follow up today:  Ahnyah Sabbath has a PMH significant for DM with complications including circulatory and neurological w/ diabetic foot ulcer, seeing an endocrinologist and podiatry for management, GERD/IBS (sees GI), obesity, HTN and Hyperlipidemia. She is due for a recheck of her cholesterol and blood pressure labs. Denies any polyuria, fevers, redness of the foot that is spreading,polydipsia or low blood sugars. She has had some palpitations and chest discomfort on and off for about 3 months. Reports that she chronically has an elevated heart rate at home in the low 100s to 1 teens. She reports that this is at rest.Reports they usually do not occur at the same time. Tends to occur at rest. Incision of a quick flutter or a discomfort in the upper chest. Not associated with general pain, shortness of breath, dyspnea on exertion, persistent chest pain, chest pain with activity that resolved rest. She would like to see a cardiologist for this. She reports a remote evaluation with a cardiologist over 10 years ago with a stress test and an echocardiogram. She has had some increased stress related to her husband retiring soon, but otherwise does not feel he has been any change in caffeine or stress level. She is not having any symptoms today. She sees Dr. Nori Riis for her gynecology exams.  ROS: negative for report of fevers, unintentional weight loss, vision changes, vision loss, hearing loss or change, sob, hemoptysis, melena, hematochezia, hematuria, genital discharge or lesions, falls, bleeding or bruising, loc, thoughts of suicide or self harm, memory loss  1.) Patient-completed health risk assessment  - completed and reviewed, see scanned documentation  2.) Review of Medical History: -PMH, PSH, Family History and current specialty and care providers reviewed and updated and listed below  - see  scanned in document in chart and below  Past Medical History:  Diagnosis Date  . Alopecia   . Anemia, mild   . Chronic cough    sees pulmonologist  . Chronic pain    chest wall and abd - s/p extensive eval  . Diabetes mellitus with neuropathy ([place])    sees endocrine  . Diverticulosis   . GERD (gastroesophageal reflux disease)    takes Nexium bid, hx erosive esophagitis  . Headache(784.0)    occasionally;r/t sinus   . History of colon polyps   . HTN (hypertension)   . Hx of amputation of lesser toe ([place])    sees podiatrist  . Hyperlipemia   . IBS (irritable bowel syndrome)   . Insomnia    takes Elavil nightly  . Joint pain   . Osteomyelitis ([place])   . Seasonal allergies    takes Zyrtec daily    Past Surgical History:  Procedure Laterality Date  . AMPUTATION  12/28/2011   Procedure: AMPUTATION DIGIT;  Surgeon: Newt Minion, MD;  Location: [facility];  Service: Orthopedics;  Laterality: Right;  Right Foot 2nd Toe Amputation at MTP (metatarsophalangeal joint)  . AMPUTATION Right 04/25/2012   Procedure: Right Foot 3rd Toe Amputation;  Surgeon: Newt Minion, MD;  Location: [facility];  Service: Orthopedics;  Laterality: Right;  Right Foot Third Toe Amputation   . AMPUTATION Right 07/27/2012   Procedure: Right 4th Toe Amputation at Metatarsophalangeal;  Surgeon: Newt Minion, MD;  Location: [facility];  Service: Orthopedics;  Laterality: Right;  Right 4th Toe Amputation at Metatarsophalangeal  . COLONOSCOPY    . LAPAROSCOPIC APPENDECTOMY  01/05/2011  Procedure: APPENDECTOMY LAPAROSCOPIC;  Surgeon: Pedro Earls, MD;  [facility];  Service: General;  Laterality: N/A;  . OOPHORECTOMY  2001  . ROTATOR CUFF REPAIR Right   . TUBAL LIGATION    . VAGINAL HYSTERECTOMY  2001    Social History   Social History  . Marital status: Married    Spouse name: N/A  . Number of children: 2  . Years of education: N/A   Occupational History  . Retired     Social History Main Topics  .  Smoking status: Never Smoker  . Smokeless tobacco: Never Used  . Alcohol use No  . Drug use: No  . Sexual activity: Yes    Birth control/ protection: Surgical   Other Topics Concern  . Not on file   Social History Narrative   Caffeine daily    [employer], [employer] no diploma   Married '66   1 dtr- '78; 1 son '71; 2 grandchildren   Occupation: retired 04   Dad with alzheimers-had to place in [place] (summer '10)          Family History  Problem Relation Age of Onset  . Lung cancer Mother 2    smoked heavily  . Emphysema Mother   . Hypertension Father   . Hyperlipidemia Father   . Diabetes Father   . Coronary artery disease Father   . Dementia Father   . COPD Father     smoked  . Stomach cancer Paternal Aunt   . Brain cancer Paternal Uncle   . Irritable bowel syndrome      Several family members on fathers side   . Diabetes Other   . Stomach cancer Maternal Aunt   . Colon cancer Neg Hx     Current Outpatient Prescriptions on File Prior to Visit  Medication Sig Dispense Refill  . acetaminophen (TYLENOL) 500 MG tablet Take 500 mg by mouth every 6 (six) hours as needed for mild pain.     Marland Kitchen amitriptyline (ELAVIL) 25 MG tablet Take 1 tablet (25 mg total) by mouth at bedtime. 90 tablet 3  . cetirizine (ZYRTEC) 10 MG tablet Take 1 tablet (10 mg total) by mouth daily. 90 tablet 1  . enalapril (VASOTEC) 20 MG tablet Take 20 mg by mouth daily.    . Fluocinolone Acetonide (DERMOTIC) 0.01 % OIL Place 1 drop in ear(s) daily as needed (itching).     . gabapentin (NEURONTIN) 300 MG capsule Take 2 tablets (600mg  total) by mouth at bedtime. 180 capsule 3  . glucose blood (ONE TOUCH ULTRA TEST) test strip USE TO TEST BLOOD SUGAR THREE TIMES DAILY AS INSTRUCTED. 300 each 1  . insulin NPH Human (NOVOLIN N RELION) 100 UNIT/ML injection Inject under skin 20 units in am and 30 units at bedtime 20 mL 11  . insulin regular (NOVOLIN R RELION) 100 units/mL injection INJECT 12 TO 30 UNITS INTO THE SKIN THREE  TIMES DAILY BEFORE MEALS 30 mL 11  . losartan (COZAAR) 25 MG tablet TAKE ONE TABLET BY MOUTH ONCE DAILY 90 tablet 0  . lovastatin (MEVACOR) 20 MG tablet TAKE ONE TABLET BY MOUTH AT BEDTIME 90 tablet 0  . metFORMIN (GLUCOPHAGE) 1000 MG tablet TAKE 1 TABLET TWICE A DAY WITH A MEAL 180 tablet 0  . Multiple Vitamin (MULTIVITAMIN WITH MINERALS) TABS tablet Take 1 tablet by mouth daily.    Marland Kitchen omeprazole (PRILOSEC) 20 MG capsule Take 2 capsules (40 mg total) by mouth 2 (two) times daily. 180 capsule  1  . RELION INSULIN SYRINGE 1ML/31G 31G X 5/16" 1 ML MISC USE 2 TIMES A DAY (Patient taking differently: USE 2-4 TIMES A DAY) 100 each 4   No current facility-administered medications on file prior to visit.      3.) Review of functional ability and level of safety:  Any difficulty hearing?  See scanned documentation  History of falling?  See scanned documentation  Any trouble with IADLs - using a phone, using transportation, grocery shopping, preparing meals, doing housework, doing laundry, taking medications and managing money?  See scanned documentation  Advance Directives?  No, she is considering.Discussed and offered more resources.  See summary of recommendations in Patient Instructions below.  4.) Physical Exam Vitals:   11/30/15 1115  BP: 130/82  Pulse: 95  Temp: 98.5 F (36.9 C)   Estimated body mass index is 34.98 kg/m as calculated from the following:   Height as of this encounter: 5' 5.75" (1.67 m).   Weight as of this encounter: 215 lb 1.6 oz (97.6 kg).   General: alert, appear well hydrated and in no acute distress  HEENT: visual acuity grossly intact  CV: HRRR, no significant lower extremity edema  Lungs: CTA bilaterally  Psych: pleasant and cooperative, no obvious depression or anxiety  Cognitive function grossly intact  Foot exam done  See patient instructions for recommendations.  Education and counseling regarding the above review of health provided  with a plan for the following: -see scanned patient completed form for further details -fall prevention strategies discussed  -healthy lifestyle discussed -importance and resources for completing advanced directives discussed -see patient instructions below for any other recommendations provided  4)The following written screening schedule of preventive measures were reviewed with assessment and plan made per below, orders and patient instructions:      AAA screening done if applicable     Alcohol screening done     Obesity Screening and counseling done     STI screening (Hep C if born 1943-65) offered and per pt wishes     Tobacco Screening done       Pneumococcal (PPSV23 -one dose after 64, one before if risk factors), influenza yearly and hepatitis B vaccines (if high risk - end stage renal disease, IV drugs, homosexual men, live in home for mentally retarded, hemophilia receiving factors) ASSESSMENT/PLAN: done today      Screening mammograph (yearly if >40) ASSESSMENT/PLAN: 08/2014 - she reports she does this with her gynecologist      Screening Pap smear/pelvic exam (q2 years) ASSESSMENT/PLAN: n/a, declined - sees Dr. Nori Riis in gynecology      Colorectal cancer screening (FOBT yearly or flex sig q4y or colonoscopy q10y or barium enema q4y) ASSESSMENT/PLAN: 12/2012 - reports today      Diabetes outpatient self-management training services ASSESSMENT/PLAN: sees endocrinologist, lifestyle recs, foot exam done today - diabetic ulcer and neuropathy - sees podiatrist      Bone mass measurements(covered q2y if indicated - estrogen def, osteoporosis, hyperparathyroid, vertebral abnormalities, osteoporosis or steroids) ASSESSMENT/PLAN: discussed, assistant instructed to order   Breast center per patient preference is      Screening for glaucoma(q1y if high risk - diabetes, FH, AA and > 55 or hispanic and > 65) ASSESSMENT/PLAN: utd or advised, sees Dr. Zadie Rhine      Medical nutritional  therapy for individuals with diabetes or renal disease ASSESSMENT/PLAN: see orders      Cardiovascular screening blood tests (lipids q5y) ASSESSMENT/PLAN: see orders and labs  Diabetes screening tests ASSESSMENT/PLAN: see orders and labs  -risk factors and conditions per above assessment were discussed and treatment, recommendations and referrals were offered per documentation above and orders and patient instructions. -see scanned documentation -foot exam done -vit D3 [phone] IU -lifestyle recs -assistant to order DEXA -PPSV3 today Hep c per her wishes -advised advanced directives and info provided -referral to cardiology per her request for the palpitations   7.) Summary:   Medicare annual wellness visit, subsequent  Uncontrolled type 2 diabetes mellitus with peripheral circulatory disorder (HCC)  Other polyneuropathy ([place])  Diabetic ulcer of other part of right foot associated with type 2 diabetes mellitus, unspecified ulcer stage ([place])  Hyperlipidemia, unspecified hyperlipidemia type - Plan: Lipid Panel  Essential hypertension - Plan: Basic metabolic panel  Gastroesophageal reflux disease without esophagitis  Palpitations - Plan: Ambulatory referral to Cardiology  Tachycardia - Plan: Ambulatory referral to Cardiology  Atypical chest pain - Plan: Ambulatory referral to Cardiology  hep c screening - Plan: Hep C Antibody  Malignant neoplasm of female breast, unspecified estrogen receptor status, unspecified laterality, unspecified site of breast ([place])  Postmenopausal status (age-related) (natural) - Plan: DG Bone Density  Estrogen deficiency - Plan: DG Bone Density  Need for prophylactic vaccination against Streptococcus pneumoniae (pneumococcus) - Plan: Pneumococcal polysaccharide vaccine 23-valent greater than or equal to 2yo subcutaneous/IM  Patient Instructions  BEFORE YOU LEAVE: -PPSV23 vaccine -advanced directives folder -please order bone density  - breast center -labs -follow up: 3-4 months  -We placed a referral for you as discussed to the cardiologist and for the bonedensity test. It usually takes about 1-2 weeks to process and schedule this referral. If you have not heard from [place] regarding this appointment in 2 weeks please contact our office.  -Vit D3 [phone] IU daily. [employer] brand was approved by consumer labs.  -work with your diabetes doctor and your foot doctor to ensure good control of your diabetes and your foot ulcer.   We recommend the following healthy lifestyle for LIFE: 1) Small portions.   Tip: eat off of a salad plate instead of a dinner plate.  Tip: It is ok to feel hungry after a meal of proper portion sizes  Tip: if you need more or a snack choose fruits, veggies and/or a handful of nuts or seeds.  2) Eat a healthy clean diet.  * Tip: Avoid (less then 1 serving per week): processed foods, sweets, sweetened drinks, white starches (rice, flour, bread, potatoes, pasta, etc), red meat, fast foods, butter  *Tip: CHOOSE instead   * 5-9 servings per day of fresh or frozen fruits and vegetables (but not corn, potatoes, bananas, canned or dried fruit)   *nuts and seeds, beans   *olives and olive oil   *small portions of lean meats such as fish and white chicken    *small portions of whole grains  3)Get at least 150 minutes of sweaty aerobic exercise per week.  4)Reduce stress - consider counseling, meditation and relaxation to balance other aspects of your life.      Colin Benton R., DO

## 2015-11-30 ENCOUNTER — Other Ambulatory Visit: Payer: Self-pay | Admitting: Family Medicine

## 2015-11-30 ENCOUNTER — Ambulatory Visit (INDEPENDENT_AMBULATORY_CARE_PROVIDER_SITE_OTHER): Payer: Medicare Other | Admitting: Family Medicine

## 2015-11-30 ENCOUNTER — Encounter: Payer: Self-pay | Admitting: Family Medicine

## 2015-11-30 VITALS — BP 130/82 | HR 95 | Temp 98.5°F | Ht 65.75 in | Wt 215.1 lb

## 2015-11-30 DIAGNOSIS — L97519 Non-pressure chronic ulcer of other part of right foot with unspecified severity: Secondary | ICD-10-CM

## 2015-11-30 DIAGNOSIS — R002 Palpitations: Secondary | ICD-10-CM

## 2015-11-30 DIAGNOSIS — R000 Tachycardia, unspecified: Secondary | ICD-10-CM

## 2015-11-30 DIAGNOSIS — Z0000 Encounter for general adult medical examination without abnormal findings: Secondary | ICD-10-CM | POA: Diagnosis not present

## 2015-11-30 DIAGNOSIS — Z780 Asymptomatic menopausal state: Secondary | ICD-10-CM

## 2015-11-30 DIAGNOSIS — E11621 Type 2 diabetes mellitus with foot ulcer: Secondary | ICD-10-CM

## 2015-11-30 DIAGNOSIS — E785 Hyperlipidemia, unspecified: Secondary | ICD-10-CM | POA: Diagnosis not present

## 2015-11-30 DIAGNOSIS — G6289 Other specified polyneuropathies: Secondary | ICD-10-CM | POA: Diagnosis not present

## 2015-11-30 DIAGNOSIS — Z7289 Other problems related to lifestyle: Secondary | ICD-10-CM

## 2015-11-30 DIAGNOSIS — E1151 Type 2 diabetes mellitus with diabetic peripheral angiopathy without gangrene: Secondary | ICD-10-CM

## 2015-11-30 DIAGNOSIS — I10 Essential (primary) hypertension: Secondary | ICD-10-CM | POA: Diagnosis not present

## 2015-11-30 DIAGNOSIS — E2839 Other primary ovarian failure: Secondary | ICD-10-CM

## 2015-11-30 DIAGNOSIS — IMO0002 Reserved for concepts with insufficient information to code with codable children: Secondary | ICD-10-CM

## 2015-11-30 DIAGNOSIS — Z1231 Encounter for screening mammogram for malignant neoplasm of breast: Secondary | ICD-10-CM

## 2015-11-30 DIAGNOSIS — C50919 Malignant neoplasm of unspecified site of unspecified female breast: Secondary | ICD-10-CM

## 2015-11-30 DIAGNOSIS — E1165 Type 2 diabetes mellitus with hyperglycemia: Secondary | ICD-10-CM

## 2015-11-30 DIAGNOSIS — R0789 Other chest pain: Secondary | ICD-10-CM

## 2015-11-30 DIAGNOSIS — Z23 Encounter for immunization: Secondary | ICD-10-CM | POA: Diagnosis not present

## 2015-11-30 DIAGNOSIS — K219 Gastro-esophageal reflux disease without esophagitis: Secondary | ICD-10-CM

## 2015-11-30 LAB — LIPID PANEL
Cholesterol: 166 mg/dL (ref 0–200)
HDL: 57.1 mg/dL (ref >39.00)
LDL Cholesterol: 80 mg/dL (ref 0–99)
NonHDL: 108.78
Total CHOL/HDL Ratio: 3
Triglycerides: 145 mg/dL (ref 0.0–149.0)
VLDL: 29 mg/dL (ref 0.0–40.0)

## 2015-11-30 LAB — BASIC METABOLIC PANEL
BUN: 14 mg/dL (ref 6–23)
CO2: 32 mEq/L (ref 19–32)
Calcium: 10.1 mg/dL (ref 8.4–10.5)
Chloride: 101 mEq/L (ref 96–112)
Creatinine, Ser: 0.7 mg/dL (ref 0.40–1.20)
GFR: 88.4 mL/min (ref >60.00)
Glucose, Bld: 64 mg/dL — ABNORMAL LOW (ref 70–99)
Potassium: 4.6 mEq/L (ref 3.5–5.1)
Sodium: 140 mEq/L (ref 135–145)

## 2015-11-30 NOTE — Progress Notes (Signed)
Pre visit review using our clinic review tool, if applicable. No additional management support is needed unless otherwise documented below in the visit note. 

## 2015-11-30 NOTE — Patient Instructions (Addendum)
BEFORE YOU LEAVE: -PPSV23 vaccine -advanced directives folder -please order bone density - breast center -labs -follow up: 3-4 months  -We placed a referral for you as discussed to the cardiologist and for the bonedensity test. It usually takes about 1-2 weeks to process and schedule this referral. If you have not heard from [place] regarding this appointment in 2 weeks please contact our office.  -Vit D3 [phone] IU daily. [employer] brand was approved by consumer labs.  -work with your diabetes doctor and your foot doctor to ensure good control of your diabetes and your foot ulcer.   We recommend the following healthy lifestyle for LIFE: 1) Small portions.   Tip: eat off of a salad plate instead of a dinner plate.  Tip: It is ok to feel hungry after a meal of proper portion sizes  Tip: if you need more or a snack choose fruits, veggies and/or a handful of nuts or seeds.  2) Eat a healthy clean diet.  * Tip: Avoid (less then 1 serving per week): processed foods, sweets, sweetened drinks, white starches (rice, flour, bread, potatoes, pasta, etc), red meat, fast foods, butter  *Tip: CHOOSE instead   * 5-9 servings per day of fresh or frozen fruits and vegetables (but not corn, potatoes, bananas, canned or dried fruit)   *nuts and seeds, beans   *olives and olive oil   *small portions of lean meats such as fish and white chicken    *small portions of whole grains  3)Get at least 150 minutes of sweaty aerobic exercise per week.  4)Reduce stress - consider counseling, meditation and relaxation to balance other aspects of your life.

## 2015-12-01 LAB — HEPATITIS C ANTIBODY: HCV Ab: NEGATIVE

## 2015-12-07 ENCOUNTER — Ambulatory Visit (INDEPENDENT_AMBULATORY_CARE_PROVIDER_SITE_OTHER): Payer: Medicare Other | Admitting: Orthopedic Surgery

## 2015-12-07 ENCOUNTER — Encounter (INDEPENDENT_AMBULATORY_CARE_PROVIDER_SITE_OTHER): Payer: Self-pay | Admitting: Orthopedic Surgery

## 2015-12-07 VITALS — Ht 65.0 in | Wt 214.0 lb

## 2015-12-07 DIAGNOSIS — B351 Tinea unguium: Secondary | ICD-10-CM | POA: Insufficient documentation

## 2015-12-07 DIAGNOSIS — L97511 Non-pressure chronic ulcer of other part of right foot limited to breakdown of skin: Secondary | ICD-10-CM

## 2015-12-07 NOTE — Progress Notes (Signed)
Wound Care Note   [name]           Date of Birth: Feb 09, 1945           MRN: [id number]             PCP: Lucretia Kern., DO Visit Date: 12/07/2015   Assessment & Plan: Visit Diagnoses:  1. Non-pressure chronic ulcer of other part of right foot limited to breakdown of skin ([place])   2. Onychomycosis     Plan: Ulcer debridement right foot. Nails trim 5. Recommended either her Darco orthopedic shoe or the fracture boot on the right. Patient complains of pain with anything touching her toes and the left will not wear her orthopedic shoes.   Follow-Up Instructions: Return in about 3 weeks (around 12/28/2015).  Orders:  No orders of the defined types were placed in this encounter.  No orders of the defined types were placed in this encounter.     Procedures: No notes on file   Clinical Data: No additional findings.   No images are attached to the encounter.   Subjective: Chief Complaint  Patient presents with  . Right Foot - Follow-up    Right foot evaluation    Patient presents today for follow up for bilateral lower extremities. She is following up on ulceration on the plantar aspect of 4th and 3rd metatarsal heads right foot. She has black eschar, there is bloody drainage. There is no odor. Patient does complain of swelling bilateral feet. She complains of pain at the right ankle. She states pain is worse at end of the day.She is also requesting nail trim.     Review of Systems    Objective: Vital Signs: Ht 5\' 5"  (1.651 m)   Wt 214 lb (97.1 kg)   BMI 35.61 kg/m   Physical Exam: On examination patient is alert oriented no adenopathy well-dressed normal affect is terrific she does have an antalgic gait. She has palpable pulses bilaterally she has heel cord tightness dorsiflexion to about neutral. She has a Wagner grade 1 ulcer beneath the second and third metatarsal heads of the right foot. After informed consent a 10 blade knife was used to  debride the skin and soft tissue back to bleeding viable granulation tissue. This was touched with silver nitrate for hemostasis. Ulcer measurements 15 mm in diameter 3 mm deep this did not probe to bone or tendon. She has thick and discolored onychomycotic nails 5 she is unable safely trim the nails ingrown nails are trimmed 5 without complications.  Specialty Comments: No specialty comments available.   PMFS History: Patient Active Problem List   Diagnosis Date Noted  . Onychomycosis 12/07/2015  . Non-pressure chronic ulcer of other part of right foot limited to breakdown of skin ([place]) 12/07/2015  . Chest wall pain 03/06/2014  . Upper airway cough syndrome 03/05/2014  . S/P amputation of lesser toe - followed by Dr. Sharol Given 04/15/2013  . Pain in joint, shoulder region, s/p RTC surgery - followed by Dr. Sharol Given 04/15/2013  . ANEMIA, MILD 02/04/2010  . Essential hypertension 12/05/2006  . Uncontrolled type 2 diabetes mellitus with peripheral circulatory disorder ([place]) 10/12/2006  . Hyperlipidemia 10/12/2006  . Allergic rhinitis 10/12/2006  . GERD - Followed by Dr. Fuller Plan 10/12/2006  . IRRITABLE BOWEL SYNDROME - followed by Dr. Fuller Plan 10/12/2006  . Peripheral neuropathy ([place]) 10/11/2006  . ALOPECIA NEC 10/11/2006   Past Medical History:  Diagnosis Date  . Alopecia   . Anemia, mild   .  Chronic cough    sees pulmonologist  . Chronic pain    chest wall and abd - s/p extensive eval  . Diabetes mellitus with neuropathy ([place])    sees endocrine  . Diverticulosis   . GERD (gastroesophageal reflux disease)    takes Nexium bid, hx erosive esophagitis  . Headache(784.0)    occasionally;r/t sinus   . History of colon polyps   . HTN (hypertension)   . Hx of amputation of lesser toe ([place])    sees podiatrist  . Hyperlipemia   . IBS (irritable bowel syndrome)   . Insomnia    takes Elavil nightly  . Joint pain   . Osteomyelitis ([place])   . Seasonal allergies    takes Zyrtec daily    Family  History  Problem Relation Age of Onset  . Lung cancer Mother 73    smoked heavily  . Emphysema Mother   . Hypertension Father   . Hyperlipidemia Father   . Diabetes Father   . Coronary artery disease Father   . Dementia Father   . COPD Father     smoked  . Stomach cancer Paternal Aunt   . Brain cancer Paternal Uncle   . Irritable bowel syndrome      Several family members on fathers side   . Diabetes Other   . Stomach cancer Maternal Aunt   . Colon cancer Neg Hx    Past Surgical History:  Procedure Laterality Date  . AMPUTATION  12/28/2011   Procedure: AMPUTATION DIGIT;  Surgeon: Newt Minion, MD;  Location: [facility];  Service: Orthopedics;  Laterality: Right;  Right Foot 2nd Toe Amputation at MTP (metatarsophalangeal joint)  . AMPUTATION Right 04/25/2012   Procedure: Right Foot 3rd Toe Amputation;  Surgeon: Newt Minion, MD;  Location: [facility];  Service: Orthopedics;  Laterality: Right;  Right Foot Third Toe Amputation   . AMPUTATION Right 07/27/2012   Procedure: Right 4th Toe Amputation at Metatarsophalangeal;  Surgeon: Newt Minion, MD;  Location: [facility];  Service: Orthopedics;  Laterality: Right;  Right 4th Toe Amputation at Metatarsophalangeal  . COLONOSCOPY    . LAPAROSCOPIC APPENDECTOMY  01/05/2011   Procedure: APPENDECTOMY LAPAROSCOPIC;  Surgeon: Pedro Earls, MD;  [facility];  Service: General;  Laterality: N/A;  . OOPHORECTOMY  2001  . ROTATOR CUFF REPAIR Right   . TUBAL LIGATION    . VAGINAL HYSTERECTOMY  2001   Social History   Occupational History  . Retired     Social History Main Topics  . Smoking status: Never Smoker  . Smokeless tobacco: Never Used  . Alcohol use No  . Drug use: No  . Sexual activity: Yes    Birth control/ protection: Surgical

## 2015-12-10 ENCOUNTER — Encounter: Payer: Medicare Other | Admitting: Family Medicine

## 2015-12-16 ENCOUNTER — Encounter: Payer: Self-pay | Admitting: *Deleted

## 2015-12-21 ENCOUNTER — Ambulatory Visit (INDEPENDENT_AMBULATORY_CARE_PROVIDER_SITE_OTHER): Payer: Medicare Other | Admitting: Internal Medicine

## 2015-12-21 ENCOUNTER — Encounter: Payer: Self-pay | Admitting: Internal Medicine

## 2015-12-21 VITALS — BP 144/72 | HR 98 | Ht 65.0 in | Wt 217.6 lb

## 2015-12-21 DIAGNOSIS — R002 Palpitations: Secondary | ICD-10-CM

## 2015-12-21 DIAGNOSIS — R000 Tachycardia, unspecified: Secondary | ICD-10-CM | POA: Diagnosis not present

## 2015-12-21 DIAGNOSIS — R079 Chest pain, unspecified: Secondary | ICD-10-CM

## 2015-12-21 NOTE — Progress Notes (Signed)
Cardiology Office Note   Date:  12/21/2015   ID:  Beth Holden, DOB 29-Aug-1945, MRN [id number]  PCP:  Lucretia Kern., DO  Cardiologist:   Dorris Carnes, MD   Pt referred for CP     History of Present Illness: Beth Holden is a 70 y.o. female with a history of Diabetes GERD, HTN, HL   Referred for chest discomfort    Cough for awhile     SSCP  Had a few minutes  ago  Gone  WIll take a breath and it helps  Nonproductive cough No reflux if takes meds   SSCP is not associated with activity Having swelling in feet and ankles   Occasional PND  Not often    Not too activie   Had toes removed   Has appt with Sharol Given next week     Heart races at time  Has occasionally gotten dizzy with Not daily  Hard to quantify    DM is long standing   Has neuropathy in feet.    Outpatient Medications Prior to Visit  Medication Sig Dispense Refill  . acetaminophen (TYLENOL) 500 MG tablet Take 500 mg by mouth every 6 (six) hours as needed for mild pain.     Marland Kitchen amitriptyline (ELAVIL) 25 MG tablet Take 1 tablet (25 mg total) by mouth at bedtime. 90 tablet 3  . cetirizine (ZYRTEC) 10 MG tablet Take 1 tablet (10 mg total) by mouth daily. 90 tablet 1  . enalapril (VASOTEC) 20 MG tablet Take 20 mg by mouth daily.    . Fluocinolone Acetonide (DERMOTIC) 0.01 % OIL Place 1 drop in ear(s) daily as needed (itching).     . gabapentin (NEURONTIN) 300 MG capsule Take 2 tablets (600mg  total) by mouth at bedtime. 180 capsule 3  . glucose blood (ONE TOUCH ULTRA TEST) test strip USE TO TEST BLOOD SUGAR THREE TIMES DAILY AS INSTRUCTED. 300 each 1  . insulin NPH Human (NOVOLIN N RELION) 100 UNIT/ML injection Inject under skin 20 units in am and 30 units at bedtime 20 mL 11  . insulin regular (NOVOLIN R RELION) 100 units/mL injection INJECT 12 TO 30 UNITS INTO THE SKIN THREE TIMES DAILY BEFORE MEALS 30 mL 11  . losartan (COZAAR) 25 MG tablet TAKE ONE TABLET BY MOUTH ONCE DAILY 90 tablet 0  . lovastatin (MEVACOR) 20 MG  tablet TAKE ONE TABLET BY MOUTH AT BEDTIME 90 tablet 0  . metFORMIN (GLUCOPHAGE) 1000 MG tablet TAKE 1 TABLET TWICE A DAY WITH A MEAL 180 tablet 0  . omeprazole (PRILOSEC) 20 MG capsule Take 2 capsules (40 mg total) by mouth 2 (two) times daily. 180 capsule 1  . RELION INSULIN SYRINGE 1ML/31G 31G X 5/16" 1 ML MISC USE 2 TIMES A DAY (Patient taking differently: USE 2-4 TIMES A DAY) 100 each 4  . Multiple Vitamin (MULTIVITAMIN WITH MINERALS) TABS tablet Take 1 tablet by mouth daily.     No facility-administered medications prior to visit.      Allergies:   Codeine and Propoxyphene hcl   Past Medical History:  Diagnosis Date  . Alopecia   . Anemia, mild   . Chronic cough    sees pulmonologist  . Chronic pain    chest wall and abd - s/p extensive eval  . Diabetes mellitus with neuropathy ([place])    sees endocrine  . Diverticulosis   . GERD (gastroesophageal reflux disease)    takes Nexium bid, hx erosive esophagitis  . Headache(784.0)  occasionally;r/t sinus   . History of colon polyps   . HTN (hypertension)   . Hx of amputation of lesser toe ([place])    sees podiatrist  . Hyperlipemia   . IBS (irritable bowel syndrome)   . Insomnia    takes Elavil nightly  . Joint pain   . Osteomyelitis ([place])   . Seasonal allergies    takes Zyrtec daily    Past Surgical History:  Procedure Laterality Date  . AMPUTATION  12/28/2011   Procedure: AMPUTATION DIGIT;  Surgeon: Newt Minion, MD;  Location: [facility];  Service: Orthopedics;  Laterality: Right;  Right Foot 2nd Toe Amputation at MTP (metatarsophalangeal joint)  . AMPUTATION Right 04/25/2012   Procedure: Right Foot 3rd Toe Amputation;  Surgeon: Newt Minion, MD;  Location: [facility];  Service: Orthopedics;  Laterality: Right;  Right Foot Third Toe Amputation   . AMPUTATION Right 07/27/2012   Procedure: Right 4th Toe Amputation at Metatarsophalangeal;  Surgeon: Newt Minion, MD;  Location: [facility];  Service: Orthopedics;  Laterality: Right;   Right 4th Toe Amputation at Metatarsophalangeal  . COLONOSCOPY    . LAPAROSCOPIC APPENDECTOMY  01/05/2011   Procedure: APPENDECTOMY LAPAROSCOPIC;  Surgeon: Pedro Earls, MD;  [facility];  Service: General;  Laterality: N/A;  . OOPHORECTOMY  2001  . ROTATOR CUFF REPAIR Right   . TUBAL LIGATION    . VAGINAL HYSTERECTOMY  2001     Social History:  The patient  reports that she has never smoked. She has never used smokeless tobacco. She reports that she does not drink alcohol or use drugs.   Family History:  The patient's family history includes Brain cancer in her paternal uncle; COPD in her father; Coronary artery disease in her father; Dementia in her father; Diabetes in her father and other; Emphysema in her mother; Hyperlipidemia in her father; Hypertension in her father; Lung cancer (age of onset: 69) in her mother; Stomach cancer in her maternal aunt and paternal aunt.    ROS:  Please see the history of present illness. All other systems are reviewed and  Negative to the above problem except as noted.    PHYSICAL EXAM: VS:  BP (!) 144/72   Pulse 98   Ht 5\' 5"  (1.651 m)   Wt 217 lb 9.6 oz (98.7 kg)   BMI 36.21 kg/m   GEN: Well nourished, well developed, in no acute distress  HEENT: normal  Neck: no JVD, carotid bruits, or masses Cardiac: RRR; no murmurs, rubs, or gallops,no edema  Respiratory:  clear to auscultation bilaterally, normal work of breathing GI: soft, nontender, nondistended, + BS  No hepatomegaly  MS: no deformity Moving all extremities   Skin: warm and dry, no rash Neuro:  Strength and sensation are intact Psych: euthymic mood, full affect   EKG:  EKG is ordered today.  SR 98 bpm     Lipid Panel    Component Value Date/Time   CHOL 166 11/30/2015 1220   TRIG 145.0 11/30/2015 1220   HDL 57.10 11/30/2015 1220   CHOLHDL 3 11/30/2015 1220   VLDL 29.0 11/30/2015 1220   LDLCALC 80 11/30/2015 1220      Wt Readings from Last 3 Encounters:    12/21/15 217 lb 9.6 oz (98.7 kg)  12/07/15 214 lb (97.1 kg)  11/30/15 215 lb 1.6 oz (97.6 kg)      ASSESSMENT AND PLAN:  1  CP Atypical but pt has multiple risk factors. I would recomm  a Lexiscan myovue to eval for ischemia  2  Cough  ? If due to ACE I  Stop for a few days  If improves call  If not resume ACEI  3  Palpitations.  Will set up for an event monitor to eval for afib    4  DM  Long standing with complcations   F/U is based on test results     Current medicines are reviewed at length with the patient today.  The patient does not have concerns regarding medicines.  Signed, Dorris Carnes, MD  12/21/2015 12:04 PM    [facility] [place], [place], [place]  [address] Phone: [phone]; Fax: [phone]

## 2015-12-21 NOTE — Patient Instructions (Addendum)
Your physician recommends that you continue on your current medications as directed. Please refer to the Current Medication list given to you today.  Your physician has recommended that you wear an event monitor. Event monitors are medical devices that record the heart's electrical activity. Doctors most often [place] these monitors to diagnose arrhythmias. Arrhythmias are problems with the speed or rhythm of the heartbeat. The monitor is a small, portable device. You can wear one while you do your normal daily activities. This is usually used to diagnose what is causing palpitations/syncope (passing out).  Your physician has requested that you have a lexiscan myoview. For further information please visit [email or web address]. Please follow instruction sheet, as given.  Hold elavil to see if cough improves.

## 2015-12-23 ENCOUNTER — Encounter: Payer: Self-pay | Admitting: Internal Medicine

## 2015-12-25 ENCOUNTER — Ambulatory Visit
Admission: RE | Admit: 2015-12-25 | Discharge: 2015-12-25 | Disposition: A | Discharge status: Home / Self Care | Payer: Medicare Other | Source: Ambulatory Visit | Attending: Family Medicine | Admitting: Family Medicine

## 2015-12-25 DIAGNOSIS — Z1231 Encounter for screening mammogram for malignant neoplasm of breast: Secondary | ICD-10-CM | POA: Diagnosis not present

## 2015-12-25 DIAGNOSIS — M85852 Other specified disorders of bone density and structure, left thigh: Secondary | ICD-10-CM | POA: Diagnosis not present

## 2015-12-25 DIAGNOSIS — E2839 Other primary ovarian failure: Secondary | ICD-10-CM

## 2015-12-25 DIAGNOSIS — Z780 Asymptomatic menopausal state: Secondary | ICD-10-CM

## 2015-12-28 ENCOUNTER — Ambulatory Visit (INDEPENDENT_AMBULATORY_CARE_PROVIDER_SITE_OTHER): Payer: Medicare Other | Admitting: Orthopedic Surgery

## 2015-12-28 VITALS — Ht 65.0 in | Wt 217.0 lb

## 2015-12-28 DIAGNOSIS — E1165 Type 2 diabetes mellitus with hyperglycemia: Secondary | ICD-10-CM | POA: Diagnosis not present

## 2015-12-28 DIAGNOSIS — E1151 Type 2 diabetes mellitus with diabetic peripheral angiopathy without gangrene: Secondary | ICD-10-CM

## 2015-12-28 DIAGNOSIS — L97511 Non-pressure chronic ulcer of other part of right foot limited to breakdown of skin: Secondary | ICD-10-CM

## 2015-12-28 DIAGNOSIS — IMO0002 Reserved for concepts with insufficient information to code with codable children: Secondary | ICD-10-CM

## 2015-12-28 NOTE — Progress Notes (Signed)
Office Visit Note   [name]           Date of Birth: 09/30/1945           MRN: [id number] Visit Date: 12/28/2015              Requested by: Lucretia Kern, DO [place], [place] [address] PCP: Lucretia Kern., DO   Assessment & Plan: Visit Diagnoses:  1. Non-pressure chronic ulcer of other part of right foot limited to breakdown of skin ([place])   2. Uncontrolled type 2 diabetes mellitus with peripheral circulatory disorder (HCC)     Plan: Patient's chronic Wagner grade 1 ulcer has improved significantly. We'll have a new felt leaving donut placed in her postoperative shoe. Continue with current wound care  Follow-Up Instructions: Return in about 4 weeks (around 01/25/2016).   Orders:  No orders of the defined types were placed in this encounter.  No orders of the defined types were placed in this encounter.     Procedures: No procedures performed   Clinical Data: No additional findings.   Subjective: Chief Complaint  Patient presents with  . Right Foot - Wound Check    Pt is wearing a post op shoe and using a corn pad to off load pressure 2nd MTH ulcer. There is no drainage, not red and not swollen but the pt complains of pain. She is full weight bearing. Pt states that there are no changes from her last visit and she does not have nay questions.    Review of Systems   Objective: Vital Signs: Ht 5\' 5"  (1.651 m)   Wt 217 lb (98.4 kg)   BMI 36.11 kg/m   Physical Exam examination patient is alert oriented no adenopathy well-dressed normal affect normal respiratory effort she does have an antalgic gait she has palpable pulses. Examination the chronic ulcer beneath the second and third metatarsal head of the right foot shows improvement. After informed consent a 10 blade knife was used to debride the skin and soft tissue back to healthy viable granulation tissue this was touched with silver nitrate a Band-Aid was applied. The ulcer is 10  mm in diameter 3 mm deep this does not probe to bone or tendon. There is no redness no cellulitis no odor no drainage no signs of infection.  Ortho Exam  Specialty Comments:  No specialty comments available.  Imaging: No results found.   PMFS History: Patient Active Problem List   Diagnosis Date Noted  . Onychomycosis 12/07/2015  . Non-pressure chronic ulcer of other part of right foot limited to breakdown of skin ([place]) 12/07/2015  . Chest wall pain 03/06/2014  . Upper airway cough syndrome 03/05/2014  . S/P amputation of lesser toe - followed by Dr. Sharol Given 04/15/2013  . Pain in joint, shoulder region, s/p RTC surgery - followed by Dr. Sharol Given 04/15/2013  . ANEMIA, MILD 02/04/2010  . Essential hypertension 12/05/2006  . Uncontrolled type 2 diabetes mellitus with peripheral circulatory disorder ([place]) 10/12/2006  . Hyperlipidemia 10/12/2006  . Allergic rhinitis 10/12/2006  . GERD - Followed by Dr. Fuller Plan 10/12/2006  . IRRITABLE BOWEL SYNDROME - followed by Dr. Fuller Plan 10/12/2006  . Peripheral neuropathy ([place]) 10/11/2006  . ALOPECIA NEC 10/11/2006   Past Medical History:  Diagnosis Date  . Alopecia   . Anemia, mild   . Chronic cough    sees pulmonologist  . Chronic pain    chest wall and abd - s/p extensive eval  .  Diabetes mellitus with neuropathy (HCC)    sees endocrine  . Diverticulosis   . GERD (gastroesophageal reflux disease)    takes Nexium bid, hx erosive esophagitis  . Headache(784.0)    occasionally;r/t sinus   . History of colon polyps   . HTN (hypertension)   . Hx of amputation of lesser toe ([place])    sees podiatrist  . Hyperlipemia   . IBS (irritable bowel syndrome)   . Insomnia    takes Elavil nightly  . Joint pain   . Osteomyelitis ([place])   . Seasonal allergies    takes Zyrtec daily    Family History  Problem Relation Age of Onset  . Lung cancer Mother 25    smoked heavily  . Emphysema Mother   . Hypertension Father   . Hyperlipidemia Father   .  Diabetes Father   . Coronary artery disease Father   . Dementia Father   . COPD Father     smoked  . Stomach cancer Paternal Aunt   . Brain cancer Paternal Uncle   . Irritable bowel syndrome      Several family members on fathers side   . Diabetes Other   . Stomach cancer Maternal Aunt   . Colon cancer Neg Hx     Past Surgical History:  Procedure Laterality Date  . AMPUTATION  12/28/2011   Procedure: AMPUTATION DIGIT;  Surgeon: Newt Minion, MD;  Location: [facility];  Service: Orthopedics;  Laterality: Right;  Right Foot 2nd Toe Amputation at MTP (metatarsophalangeal joint)  . AMPUTATION Right 04/25/2012   Procedure: Right Foot 3rd Toe Amputation;  Surgeon: Newt Minion, MD;  Location: [facility];  Service: Orthopedics;  Laterality: Right;  Right Foot Third Toe Amputation   . AMPUTATION Right 07/27/2012   Procedure: Right 4th Toe Amputation at Metatarsophalangeal;  Surgeon: Newt Minion, MD;  Location: [facility];  Service: Orthopedics;  Laterality: Right;  Right 4th Toe Amputation at Metatarsophalangeal  . COLONOSCOPY    . LAPAROSCOPIC APPENDECTOMY  01/05/2011   Procedure: APPENDECTOMY LAPAROSCOPIC;  Surgeon: Pedro Earls, MD;  [facility];  Service: General;  Laterality: N/A;  . OOPHORECTOMY  2001  . ROTATOR CUFF REPAIR Right   . TUBAL LIGATION    . VAGINAL HYSTERECTOMY  2001   Social History   Occupational History  . Retired     Social History Main Topics  . Smoking status: Never Smoker  . Smokeless tobacco: Never Used  . Alcohol use No  . Drug use: No  . Sexual activity: Yes    Birth control/ protection: Surgical

## 2015-12-29 ENCOUNTER — Telehealth (HOSPITAL_COMMUNITY): Payer: Self-pay | Admitting: *Deleted

## 2015-12-29 NOTE — Telephone Encounter (Signed)
Left message on voicemail per DPR in reference to upcoming appointment scheduled on 01/01/16 at 0715 with detailed instructions given per Myocardial Perfusion Study Information Sheet for the test. LM to arrive 15 minutes early, and that it is imperative to arrive on time for appointment to keep from having the test rescheduled. If you need to cancel or reschedule your appointment, please call the office within 24 hours of your appointment. Failure to do so may result in a cancellation of your appointment, and a $50 no show fee. Phone number given for call back for any questions.

## 2016-01-01 ENCOUNTER — Ambulatory Visit (HOSPITAL_COMMUNITY): Payer: Medicare Other | Attending: Internal Medicine

## 2016-01-01 ENCOUNTER — Ambulatory Visit (INDEPENDENT_AMBULATORY_CARE_PROVIDER_SITE_OTHER): Payer: Medicare Other

## 2016-01-01 DIAGNOSIS — R002 Palpitations: Secondary | ICD-10-CM

## 2016-01-01 DIAGNOSIS — R079 Chest pain, unspecified: Secondary | ICD-10-CM | POA: Diagnosis not present

## 2016-01-01 DIAGNOSIS — R000 Tachycardia, unspecified: Secondary | ICD-10-CM | POA: Diagnosis not present

## 2016-01-01 MED ORDER — TECHNETIUM TC 99M TETROFOSMIN IV KIT
32.9000 | PACK | Freq: Once | INTRAVENOUS | Status: AC | PRN
  Administered 2016-01-01: 32.9 via INTRAVENOUS
  Filled 2016-01-01: qty 33

## 2016-01-01 MED ORDER — REGADENOSON 0.4 MG/5ML IV SOLN
0.4000 mg | Freq: Once | INTRAVENOUS | Status: AC
  Administered 2016-01-01: 0.4 mg via INTRAVENOUS

## 2016-01-15 ENCOUNTER — Ambulatory Visit (HOSPITAL_COMMUNITY): Payer: Medicare Other | Attending: Cardiovascular Disease

## 2016-01-15 LAB — MYOCARDIAL PERFUSION IMAGING
LV dias vol: 74 mL (ref 46–106)
LV sys vol: 32 mL
Peak HR: 111 {beats}/min
RATE: 0.28
Rest HR: 93 {beats}/min
SDS: 0
SRS: 10
SSS: 5
TID: 0.99

## 2016-01-15 MED ORDER — TECHNETIUM TC 99M TETROFOSMIN IV KIT
32.6000 | PACK | Freq: Once | INTRAVENOUS | Status: AC | PRN
  Administered 2016-01-15: 32.6 via INTRAVENOUS
  Filled 2016-01-15: qty 33

## 2016-01-29 ENCOUNTER — Ambulatory Visit (INDEPENDENT_AMBULATORY_CARE_PROVIDER_SITE_OTHER): Payer: Medicare Other | Admitting: Orthopedic Surgery

## 2016-01-29 ENCOUNTER — Ambulatory Visit: Payer: Medicare Other | Admitting: Internal Medicine

## 2016-01-29 VITALS — Ht 65.0 in | Wt 217.0 lb

## 2016-01-29 DIAGNOSIS — E1151 Type 2 diabetes mellitus with diabetic peripheral angiopathy without gangrene: Secondary | ICD-10-CM | POA: Diagnosis not present

## 2016-01-29 DIAGNOSIS — IMO0002 Reserved for concepts with insufficient information to code with codable children: Secondary | ICD-10-CM

## 2016-01-29 DIAGNOSIS — L97511 Non-pressure chronic ulcer of other part of right foot limited to breakdown of skin: Secondary | ICD-10-CM

## 2016-01-29 DIAGNOSIS — B351 Tinea unguium: Secondary | ICD-10-CM

## 2016-01-29 DIAGNOSIS — E1165 Type 2 diabetes mellitus with hyperglycemia: Secondary | ICD-10-CM

## 2016-01-29 NOTE — Progress Notes (Signed)
Office Visit Note   [name]           Date of Birth: [birth year]           MRN: [id number] Visit Date: 01/29/2016              Requested by: Lucretia Kern, DO [address] [place], [place] [address] PCP: Lucretia Kern., DO  Chief Complaint  Patient presents with  . Right Foot - Follow-up    ulcer    HPI: Patient is here for follow up of right foot ulcer. Patient has been using a donut and post op shoe with silvadene dressings daily. She states that the donut has " shifted" and has caused additional pressure on the ulcer and this has caused the ulcer to open. She is full weight bearing . She has a thick yellow callus with no swelling and no drainage. Pamella Pert, RMA    Assessment & Plan: Visit Diagnoses:  1. Uncontrolled type 2 diabetes mellitus with peripheral circulatory disorder ([place])   2. Non-pressure chronic ulcer of other part of right foot limited to breakdown of skin ([place])   3. Onychomycosis     Plan: Nails trimmed 7 ulcer debrided of skin and soft tissue right foot who felt leaving pad applied to her [place] shoe  Follow-Up Instructions: Return in about 4 weeks (around 02/26/2016).   Ortho Exam Examination patient is alert oriented no adenopathy well-dressed normal affect normal S2 effort she has antalgic gait. Examination of both feet she has thick and discolored onychomycotic nails 7 she is unable to trim the nails on around due to her diabetic insensate neuropathy and nails were trimmed time 7 complications. She has recurrent Wagner grade 1 ulcer beneath the second and third metatarsal heads right foot. After informed consent a 10 blade knife was used to debride the skin and soft tissue back to bleeding viable granulation tissue the ulcer is 10 mm in diameter 1 mm deep no cellulitis no drainage no odor no signs of infection.  Imaging: No results found.  Orders:  No orders of the defined types were placed in this encounter.  No orders of  the defined types were placed in this encounter.    Procedures: No procedures performed  Clinical Data: No additional findings.  Subjective: Review of Systems  Objective: Vital Signs: Ht 5\' 5"  (1.651 m)   Wt 217 lb (98.4 kg)   BMI 36.11 kg/m   Specialty Comments:  No specialty comments available.  PMFS History: Patient Active Problem List   Diagnosis Date Noted  . Onychomycosis 12/07/2015  . Non-pressure chronic ulcer of other part of right foot limited to breakdown of skin ([facility]) 12/07/2015  . Chest wall pain 03/06/2014  . Upper airway cough syndrome 03/05/2014  . S/P amputation of lesser toe - followed by Dr. Sharol Given 04/15/2013  . Pain in joint, shoulder region, s/p RTC surgery - followed by Dr. Sharol Given 04/15/2013  . ANEMIA, MILD 02/04/2010  . Essential hypertension 12/05/2006  . Uncontrolled type 2 diabetes mellitus with peripheral circulatory disorder ([place]) 10/12/2006  . Hyperlipidemia 10/12/2006  . Allergic rhinitis 10/12/2006  . GERD - Followed by Dr. Fuller Plan 10/12/2006  . IRRITABLE BOWEL SYNDROME - followed by Dr. Fuller Plan 10/12/2006  . Peripheral neuropathy ([facility]) 10/11/2006  . ALOPECIA NEC 10/11/2006   Past Medical History:  Diagnosis Date  . Alopecia   . Anemia, mild   . Chronic cough    sees pulmonologist  . Chronic pain  chest wall and abd - s/p extensive eval  . Diabetes mellitus with neuropathy ([facility])    sees endocrine  . Diverticulosis   . GERD (gastroesophageal reflux disease)    takes Nexium bid, hx erosive esophagitis  . Headache(784.0)    occasionally;r/t sinus   . History of colon polyps   . HTN (hypertension)   . Hx of amputation of lesser toe ([facility])    sees podiatrist  . Hyperlipemia   . IBS (irritable bowel syndrome)   . Insomnia    takes Elavil nightly  . Joint pain   . Osteomyelitis ([place])   . Seasonal allergies    takes Zyrtec daily    Family History  Problem Relation Age of Onset  . Lung cancer Mother 37    smoked heavily  .  Emphysema Mother   . Hypertension Father   . Hyperlipidemia Father   . Diabetes Father   . Coronary artery disease Father   . Dementia Father   . COPD Father     smoked  . Stomach cancer Paternal Aunt   . Brain cancer Paternal Uncle   . Irritable bowel syndrome      Several family members on fathers side   . Diabetes Other   . Stomach cancer Maternal Aunt   . Colon cancer Neg Hx     Past Surgical History:  Procedure Laterality Date  . AMPUTATION  12/28/2011   Procedure: AMPUTATION DIGIT;  Surgeon: Newt Minion, MD;  Location: [facility];  Service: Orthopedics;  Laterality: Right;  Right Foot 2nd Toe Amputation at MTP (metatarsophalangeal joint)  . AMPUTATION Right 04/25/2012   Procedure: Right Foot 3rd Toe Amputation;  Surgeon: Newt Minion, MD;  Location: [facility];  Service: Orthopedics;  Laterality: Right;  Right Foot Third Toe Amputation   . AMPUTATION Right 07/27/2012   Procedure: Right 4th Toe Amputation at Metatarsophalangeal;  Surgeon: Newt Minion, MD;  Location: [facility];  Service: Orthopedics;  Laterality: Right;  Right 4th Toe Amputation at Metatarsophalangeal  . COLONOSCOPY    . LAPAROSCOPIC APPENDECTOMY  01/05/2011   Procedure: APPENDECTOMY LAPAROSCOPIC;  Surgeon: Pedro Earls, MD;  [facility];  Service: General;  Laterality: N/A;  . OOPHORECTOMY  2001  . ROTATOR CUFF REPAIR Right   . TUBAL LIGATION    . VAGINAL HYSTERECTOMY  2001   Social History   Occupational History  . Retired     Social History Main Topics  . Smoking status: Never Smoker  . Smokeless tobacco: Never Used  . Alcohol use No  . Drug use: No  . Sexual activity: Yes    Birth control/ protection: Surgical

## 2016-02-06 ENCOUNTER — Other Ambulatory Visit: Payer: Self-pay | Admitting: Internal Medicine

## 2016-02-06 ENCOUNTER — Telehealth: Payer: Self-pay | Admitting: Internal Medicine

## 2016-02-08 ENCOUNTER — Other Ambulatory Visit: Payer: Self-pay

## 2016-02-08 MED ORDER — INSULIN REGULAR HUMAN 100 UNIT/ML IJ SOLN: INTRAMUSCULAR | 11 refills | Status: DC

## 2016-02-08 MED ORDER — METFORMIN HCL 1000 MG PO TABS: ORAL_TABLET | ORAL | 0 refills | Status: DC

## 2016-02-08 NOTE — Telephone Encounter (Signed)
Called patient regarding questions on the insulins. Requested one more vial as it would be cheaper for her to received 4 then 3 vials. I will send in and see if this works for her. I submitted to metformin rx, and patient advised that last appointment was cancelled due to weather, and she rescheduled for march appointment.

## 2016-02-11 ENCOUNTER — Other Ambulatory Visit: Payer: Self-pay | Admitting: *Deleted

## 2016-02-11 MED ORDER — METOPROLOL SUCCINATE ER 25 MG PO TB24: 25.0000 mg | ORAL_TABLET | Freq: Every day | ORAL | 11 refills | Status: DC

## 2016-02-14 ENCOUNTER — Encounter: Payer: Self-pay | Admitting: Family Medicine

## 2016-02-15 NOTE — Telephone Encounter (Signed)
Refill of insulin   [facility] Human (NOVOLIN N RELION) 100 UNIT/ML injection  [place], [place] - [id number] [phone] (Phone) [phone] (Fax)   Please call her she has questions about insulin.

## 2016-02-16 ENCOUNTER — Telehealth: Payer: Self-pay

## 2016-02-16 ENCOUNTER — Other Ambulatory Visit: Payer: Self-pay

## 2016-02-16 MED ORDER — INSULIN REGULAR HUMAN 100 UNIT/ML IJ SOLN: INTRAMUSCULAR | 11 refills | Status: DC

## 2016-02-16 NOTE — Telephone Encounter (Signed)
I called patient to ask about questions she had regarding insulin. Will submit the refills to pharmacy. Gave call back number.

## 2016-02-19 ENCOUNTER — Other Ambulatory Visit: Payer: Self-pay

## 2016-02-19 MED ORDER — INSULIN NPH (HUMAN) (ISOPHANE) 100 UNIT/ML ~~LOC~~ SUSP: SUBCUTANEOUS | 11 refills | Status: DC

## 2016-02-22 ENCOUNTER — Ambulatory Visit (INDEPENDENT_AMBULATORY_CARE_PROVIDER_SITE_OTHER): Payer: Medicare Other | Admitting: Orthopedic Surgery

## 2016-02-22 VITALS — Ht 65.0 in | Wt 217.0 lb

## 2016-02-22 DIAGNOSIS — L97511 Non-pressure chronic ulcer of other part of right foot limited to breakdown of skin: Secondary | ICD-10-CM

## 2016-02-22 NOTE — Progress Notes (Signed)
Office Visit Note   [name]           Date of Birth: 1945/08/04           MRN: [id number] Visit Date: 02/22/2016              Requested by: Lucretia Kern, DO [address] [place],  [address] PCP: Lucretia Kern., DO  Chief Complaint  Patient presents with  . Right Foot - Nail Problem  . Left Foot - Nail Problem    HPI: Pt is here for bilateral foot evaluation. Callus trim and nail trim. She is in a post op shoe on one side and a sandal on the other. She ambulates with a cane. Does not have any questions or concerns today. Pamella Pert, RMA    Assessment & Plan: Visit Diagnoses:  1. Non-pressure chronic ulcer of other part of right foot limited to breakdown of skin ([place])     Plan: Ulcer debridement of skin and soft tissue. Continue protective shoe wear continue with the felt leaving Dona recommended stationary bicycling for exercise.  Follow-Up Instructions: Return in about 4 weeks (around 03/21/2016).   Ortho Exam On examination patient is alert oriented no adenopathy well-dressed normal affect normal respiratory effort. She has an antalgic gait. Examination of right foot she has a Wagner grade 1 ulcer beneath the second and third metatarsal heads. After informed consent a 10 blade knife was used to debride the skin and soft tissue back to bleeding viable granulation tissue and this was touched with silver nitrate. The ulcer is 1 cm diameter 1 mm deep. Examination left foot she does have some hypertrophic callus and this was pared. There is no redness no cellulitis in either lower extremity no signs of infection.  Imaging: No results found.  Orders:  No orders of the defined types were placed in this encounter.  No orders of the defined types were placed in this encounter.    Procedures: No procedures performed  Clinical Data: No additional findings.  Subjective: Review of Systems  Objective: Vital Signs: Ht 5\' 5"  (1.651 m)   Wt  217 lb (98.4 kg)   BMI 36.11 kg/m   Specialty Comments:  No specialty comments available.  PMFS History: Patient Active Problem List   Diagnosis Date Noted  . Onychomycosis 12/07/2015  . Non-pressure chronic ulcer of other part of right foot limited to breakdown of skin ([place]) 12/07/2015  . Chest wall pain 03/06/2014  . Upper airway cough syndrome 03/05/2014  . S/P amputation of lesser toe - followed by Dr. Sharol Given 04/15/2013  . Pain in joint, shoulder region, s/p RTC surgery - followed by Dr. Sharol Given 04/15/2013  . ANEMIA, MILD 02/04/2010  . Essential hypertension 12/05/2006  . Uncontrolled type 2 diabetes mellitus with peripheral circulatory disorder ([place]) 10/12/2006  . Hyperlipidemia 10/12/2006  . Allergic rhinitis 10/12/2006  . GERD - Followed by Dr. Fuller Plan 10/12/2006  . IRRITABLE BOWEL SYNDROME - followed by Dr. Fuller Plan 10/12/2006  . Peripheral neuropathy ([place]) 10/11/2006  . ALOPECIA NEC 10/11/2006   Past Medical History:  Diagnosis Date  . Alopecia   . Anemia, mild   . Chronic cough    sees pulmonologist  . Chronic pain    chest wall and abd - s/p extensive eval  . Diabetes mellitus with neuropathy ([place])    sees endocrine  . Diverticulosis   . GERD (gastroesophageal reflux disease)    takes Nexium bid, hx erosive esophagitis  .  Headache(784.0)    occasionally;r/t sinus   . History of colon polyps   . HTN (hypertension)   . Hx of amputation of lesser toe ([place])    sees podiatrist  . Hyperlipemia   . IBS (irritable bowel syndrome)   . Insomnia    takes Elavil nightly  . Joint pain   . Osteomyelitis ([place])   . Seasonal allergies    takes Zyrtec daily    Family History  Problem Relation Age of Onset  . Lung cancer Mother 36    smoked heavily  . Emphysema Mother   . Hypertension Father   . Hyperlipidemia Father   . Diabetes Father   . Coronary artery disease Father   . Dementia Father   . COPD Father     smoked  . Stomach cancer Paternal Aunt   . Brain cancer  Paternal Uncle   . Irritable bowel syndrome      Several family members on fathers side   . Diabetes Other   . Stomach cancer Maternal Aunt   . Colon cancer Neg Hx     Past Surgical History:  Procedure Laterality Date  . AMPUTATION  12/28/2011   Procedure: AMPUTATION DIGIT;  Surgeon: Newt Minion, MD;  Location: [facility];  Service: Orthopedics;  Laterality: Right;  Right Foot 2nd Toe Amputation at MTP (metatarsophalangeal joint)  . AMPUTATION Right 04/25/2012   Procedure: Right Foot 3rd Toe Amputation;  Surgeon: Newt Minion, MD;  Location: [facility];  Service: Orthopedics;  Laterality: Right;  Right Foot Third Toe Amputation   . AMPUTATION Right 07/27/2012   Procedure: Right 4th Toe Amputation at Metatarsophalangeal;  Surgeon: Newt Minion, MD;  Location: [facility];  Service: Orthopedics;  Laterality: Right;  Right 4th Toe Amputation at Metatarsophalangeal  . COLONOSCOPY    . LAPAROSCOPIC APPENDECTOMY  01/05/2011   Procedure: APPENDECTOMY LAPAROSCOPIC;  Surgeon: Pedro Earls, MD;  [facility];  Service: General;  Laterality: N/A;  . OOPHORECTOMY  2001  . ROTATOR CUFF REPAIR Right   . TUBAL LIGATION    . VAGINAL HYSTERECTOMY  2001   Social History   Occupational History  . Retired     Social History Main Topics  . Smoking status: Never Smoker  . Smokeless tobacco: Never Used  . Alcohol use No  . Drug use: No  . Sexual activity: Yes    Birth control/ protection: Surgical

## 2016-02-25 ENCOUNTER — Other Ambulatory Visit: Payer: Self-pay | Admitting: Internal Medicine

## 2016-02-25 ENCOUNTER — Other Ambulatory Visit: Payer: Self-pay | Admitting: Family Medicine

## 2016-03-01 ENCOUNTER — Ambulatory Visit: Payer: Medicare Other | Admitting: Family Medicine

## 2016-03-04 ENCOUNTER — Ambulatory Visit (INDEPENDENT_AMBULATORY_CARE_PROVIDER_SITE_OTHER): Payer: Medicare Other | Admitting: Family Medicine

## 2016-03-04 ENCOUNTER — Encounter: Payer: Self-pay | Admitting: Family Medicine

## 2016-03-04 VITALS — BP 138/80 | HR 99 | Temp 98.3°F | Ht 65.0 in | Wt 226.5 lb

## 2016-03-04 DIAGNOSIS — E1151 Type 2 diabetes mellitus with diabetic peripheral angiopathy without gangrene: Secondary | ICD-10-CM | POA: Diagnosis not present

## 2016-03-04 DIAGNOSIS — Z6837 Body mass index (BMI) 37.0-37.9, adult: Secondary | ICD-10-CM | POA: Diagnosis not present

## 2016-03-04 DIAGNOSIS — R000 Tachycardia, unspecified: Secondary | ICD-10-CM | POA: Diagnosis not present

## 2016-03-04 DIAGNOSIS — E1165 Type 2 diabetes mellitus with hyperglycemia: Secondary | ICD-10-CM

## 2016-03-04 DIAGNOSIS — E785 Hyperlipidemia, unspecified: Secondary | ICD-10-CM | POA: Diagnosis not present

## 2016-03-04 DIAGNOSIS — IMO0002 Reserved for concepts with insufficient information to code with codable children: Secondary | ICD-10-CM

## 2016-03-04 DIAGNOSIS — I10 Essential (primary) hypertension: Secondary | ICD-10-CM | POA: Diagnosis not present

## 2016-03-04 DIAGNOSIS — G6289 Other specified polyneuropathies: Secondary | ICD-10-CM

## 2016-03-04 NOTE — Patient Instructions (Signed)
BEFORE YOU LEAVE: -follow up: 3 months  Please check blood pressure medications and call with complete list when you get home. Would like for you to stop enalapril and ensure taking losartan instead.  Can start the metoprolol for th heart rate if you like.  Follow up with your endocrinologist as planned.   We recommend the following healthy lifestyle for LIFE: 1) Small portions.   Tip: eat off of a salad plate instead of a dinner plate.  Tip: It is ok to feel hungry after a meal - that likely means you ate an appropriate portion.  Tip: if you need more or a snack choose fruits, veggies and/or a handful of nuts or seeds.  2) Eat a healthy clean diet.  * Tip: Avoid (less then 1 serving per week): processed foods, sweets, sweetened drinks, white starches (rice, flour, bread, potatoes, pasta, etc), red meat, fast foods, butter  *Tip: CHOOSE instead   * 5-9 servings per day of fresh or frozen fruits and vegetables (but not corn, potatoes, bananas, canned or dried fruit)   *nuts and seeds, beans   *olives and olive oil   *small portions of lean meats such as fish and white chicken    *small portions of whole grains  3)Get at least 150 minutes of sweaty aerobic exercise per week.  4)Reduce stress - consider counseling, meditation and relaxation to balance other aspects of your life.

## 2016-03-04 NOTE — Progress Notes (Signed)
HPI:  Beth Holden is a pleasant 71 y.o. here for follow up. Chronic medical problems summarized below were reviewed for changes and stability and were updated as needed below. These issues and their treatment remain stable for the most part. She has a cough intermittently. Hx allergies, GERD and upper airway cough syndrome. Saw pulmonologist for this but did not like him. On review of med list today arb and acei listed - I am not sure whom added the acei and she is not sure if she is taking both. Cardiology started metoprolol for the tachycardia, but she has not yet started it. She reports she will be seeing her endocrinologist soon about the diabetes and has increased her insulin. She had her diabetic eye exam about 1 year ago. She is not exercise and admits her diet is very poor. Denies CP, SOB, DOE, treatment intolerance or new symptoms.  Diabetes: -complication: circulatory, neurological, diabetic foot ulcer -sees endocrinologist and podiatrist -meds: metformin, gabapentin, elavil, isulin  Obesity, Hyperlipidemia, HTN: -meds: toprol XL (added by cardiology), losartan?, enalapril?, statin  GERD/IBS: -sees GI  Palpitations: -saw cardiologist per her request 12/2015 -metoprolol added  ROS: See pertinent positives and negatives per HPI.  Past Medical History:  Diagnosis Date  . Alopecia   . Anemia, mild   . Chronic cough    sees pulmonologist  . Chronic pain    chest wall and abd - s/p extensive eval  . Diabetes mellitus with neuropathy ([place])    sees endocrine  . Diverticulosis   . GERD (gastroesophageal reflux disease)    takes Nexium bid, hx erosive esophagitis  . Headache(784.0)    occasionally;r/t sinus   . History of colon polyps   . HTN (hypertension)   . Hx of amputation of lesser toe ([place])    sees podiatrist  . Hyperlipemia   . IBS (irritable bowel syndrome)   . Insomnia    takes Elavil nightly  . Joint pain   . Osteomyelitis ([place])   . Seasonal allergies    takes Zyrtec daily    Past Surgical History:  Procedure Laterality Date  . AMPUTATION  12/28/2011   Procedure: AMPUTATION DIGIT;  Surgeon: Newt Minion, MD;  Location: [facility];  Service: Orthopedics;  Laterality: Right;  Right Foot 2nd Toe Amputation at MTP (metatarsophalangeal joint)  . AMPUTATION Right 04/25/2012   Procedure: Right Foot 3rd Toe Amputation;  Surgeon: Newt Minion, MD;  Location: [facility];  Service: Orthopedics;  Laterality: Right;  Right Foot Third Toe Amputation   . AMPUTATION Right 07/27/2012   Procedure: Right 4th Toe Amputation at Metatarsophalangeal;  Surgeon: Newt Minion, MD;  Location: [facility];  Service: Orthopedics;  Laterality: Right;  Right 4th Toe Amputation at Metatarsophalangeal  . COLONOSCOPY    . LAPAROSCOPIC APPENDECTOMY  01/05/2011   Procedure: APPENDECTOMY LAPAROSCOPIC;  Surgeon: Pedro Earls, MD;  [facility];  Service: General;  Laterality: N/A;  . OOPHORECTOMY  2001  . ROTATOR CUFF REPAIR Right   . TUBAL LIGATION    . VAGINAL HYSTERECTOMY  2001    Family History  Problem Relation Age of Onset  . Lung cancer Mother 92    smoked heavily  . Emphysema Mother   . Hypertension Father   . Hyperlipidemia Father   . Diabetes Father   . Coronary artery disease Father   . Dementia Father   . COPD Father     smoked  . Stomach cancer Paternal Aunt   . Brain cancer  Paternal Uncle   . Irritable bowel syndrome      Several family members on fathers side   . Diabetes Other   . Stomach cancer Maternal Aunt   . Colon cancer Neg Hx     Social History   Social History  . Marital status: Married    Spouse name: N/A  . Number of children: 2  . Years of education: N/A   Occupational History  . Retired     Social History Main Topics  . Smoking status: Never Smoker  . Smokeless tobacco: Never Used  . Alcohol use No  . Drug use: No  . Sexual activity: Yes    Birth control/ protection: Surgical   Other Topics Concern  . None   Social  History Narrative   Caffeine daily    [employer], [employer] no diploma   Married '66   1 dtr- '78; 1 son '71; 2 grandchildren   Occupation: retired 04   Dad with alzheimers-had to place in [place] (summer '10)           Current Outpatient Prescriptions:  .  acetaminophen (TYLENOL) 500 MG tablet, Take 500 mg by mouth every 6 (six) hours as needed for mild pain. , Disp: , Rfl:  .  amitriptyline (ELAVIL) 25 MG tablet, Take 1 tablet (25 mg total) by mouth at bedtime., Disp: 90 tablet, Rfl: 3 .  cetirizine (ZYRTEC) 10 MG tablet, TAKE ONE TABLET BY MOUTH ONCE DAILY, Disp: 90 tablet, Rfl: 1 .  Cholecalciferol (VITAMIN D PO), Take by mouth., Disp: , Rfl:  .  Fluocinolone Acetonide (DERMOTIC) 0.01 % OIL, Place 1 drop in ear(s) daily as needed (itching). , Disp: , Rfl:  .  gabapentin (NEURONTIN) 300 MG capsule, Take 2 tablets (600mg  total) by mouth at bedtime., Disp: 180 capsule, Rfl: 3 .  glucose blood (ONE TOUCH ULTRA TEST) test strip, USE TO TEST BLOOD SUGAR THREE TIMES DAILY AS INSTRUCTED., Disp: 300 each, Rfl: 1 .  insulin NPH Human (NOVOLIN N RELION) 100 UNIT/ML injection, Inject under skin 30 units in am and 30 units at bedtime, Disp: 20 mL, Rfl: 11 .  insulin regular (NOVOLIN R RELION) 250 units/2.52mL (100 units/mL) injection, INJECT 12 TO 30 UNITS INTO THE SKIN THREE TIMES DAILY BEFORE MEALS, Disp: 40 mL, Rfl: 11 .  losartan (COZAAR) 25 MG tablet, TAKE ONE TABLET BY MOUTH ONCE DAILY., Disp: 90 tablet, Rfl: 1 .  lovastatin (MEVACOR) 20 MG tablet, TAKE ONE TABLET BY MOUTH AT BEDTIME., Disp: 90 tablet, Rfl: 1 .  metFORMIN (GLUCOPHAGE) 1000 MG tablet, TAKE 1 TABLET TWICE A DAY WITH A MEAL, Disp: 180 tablet, Rfl: 0 .  metoprolol succinate (TOPROL XL) 25 MG 24 hr tablet, Take 1 tablet (25 mg total) by mouth daily., Disp: 30 tablet, Rfl: 11 .  omeprazole (PRILOSEC) 20 MG capsule, Take 2 capsules (40 mg total) by mouth 2 (two) times daily., Disp: 180 capsule, Rfl: 1 .  RELION INSULIN SYRINGE 1ML/31G 31G X 5/16" 1  ML MISC, USE 2 TIMES A DAY (Patient taking differently: USE 2-4 TIMES A DAY), Disp: 100 each, Rfl: 4  EXAM:  Vitals:   03/04/16 1007  BP: 138/80  Pulse: 99  Temp: 98.3 F (36.8 C)    Body mass index is 37.69 kg/m.  GENERAL: vitals reviewed and listed above, alert, oriented, appears well hydrated and in no acute distress  HEENT: atraumatic, conjunttiva clear, no obvious abnormalities on inspection of external nose and ears  NECK: no obvious masses  on inspection  LUNGS: clear to auscultation bilaterally, no wheezes, rales or rhonchi, good air movement  CV: HRRR, no peripheral edema  MS: moves all extremities without noticeable abnormality  PSYCH: pleasant and cooperative, no obvious depression or anxiety  ASSESSMENT AND PLAN:  Discussed the following assessment and plan:  Essential hypertension  Hyperlipidemia, unspecified hyperlipidemia type  Uncontrolled type 2 diabetes mellitus with peripheral circulatory disorder (H[facility])  Other polyneuropathy ([facility])  BMI 37.0-37.9, adult  Tachycardia  -will have her stop acei if taking - not sure why has acei and arb on list - does not look like I rxd this, if cough persists advised pulm f/u and she may try a different pulmonologist as she reports she was not happy with bedside manner of prior pulmonologist she saw -she plans to start bb for tachycardia rxd by cardiology - this should help bp as well -she plans to follow up with endo for diabetes recheck and labs -discussed lifestyle at length - stressed importance of healthy diet and regular exercise as able - discussed exercises she could do including hand weights, core exercises, chair exercises, cycling, etc -Patient advised to return or notify a doctor immediately if symptoms worsen or persist or new concerns arise.  Patient Instructions  BEFORE YOU LEAVE: -follow up: 3 months  Please check blood pressure medications and call with complete list when you get home. Would like  for you to stop enalapril and ensure taking losartan instead.  Can start the metoprolol for th heart rate if you like.  Follow up with your endocrinologist as planned.   We recommend the following healthy lifestyle for LIFE: 1) Small portions.   Tip: eat off of a salad plate instead of a dinner plate.  Tip: It is ok to feel hungry after a meal - that likely means you ate an appropriate portion.  Tip: if you need more or a snack choose fruits, veggies and/or a handful of nuts or seeds.  2) Eat a healthy clean diet.  * Tip: Avoid (less then 1 serving per week): processed foods, sweets, sweetened drinks, white starches (rice, flour, bread, potatoes, pasta, etc), red meat, fast foods, butter  *Tip: CHOOSE instead   * 5-9 servings per day of fresh or frozen fruits and vegetables (but not corn, potatoes, bananas, canned or dried fruit)   *nuts and seeds, beans   *olives and olive oil   *small portions of lean meats such as fish and white chicken    *small portions of whole grains  3)Get at least 150 minutes of sweaty aerobic exercise per week.  4)Reduce stress - consider counseling, meditation and relaxation to balance other aspects of your life.     Colin Benton R., DO

## 2016-03-04 NOTE — Progress Notes (Signed)
Pre visit review using our clinic review tool, if applicable. No additional management support is needed unless otherwise documented below in the visit note. 

## 2016-03-10 ENCOUNTER — Telehealth: Payer: Self-pay | Admitting: Internal Medicine

## 2016-03-10 ENCOUNTER — Telehealth: Payer: Self-pay

## 2016-03-10 NOTE — Telephone Encounter (Signed)
Beth Holden please tell her not to take more than 20 units of Novolin N at bedtime for now, but if she continues to drop her sugars, she may also need to back off the Novolin R with dinner. I will discuss with her more at next visit in 2 weeks, but to let us know before then if this happens again.

## 2016-03-10 NOTE — Telephone Encounter (Signed)
Patient has been having low b/s and having problems with constipation she is taking fiber, and would like to know if that could be the cause. Please advise

## 2016-03-10 NOTE — Telephone Encounter (Incomplete)
Blood sugar for the past week

## 2016-03-10 NOTE — Telephone Encounter (Signed)
Fibers can slow the absorption of blood sugars. However, I would not expect his increasing fiber to lower blood sugars significantly. Can she give a more detailed about the low blood sugars and exact regimen that he is taking for now?

## 2016-03-10 NOTE — Telephone Encounter (Signed)
Called patient to ask about sugars.   The past couple of days have been as follows: 2/28- 7:15am-87 7:06am-67 2:27am- 288 12:10pm-296  2/27-4:38pm- 80 1:26am- 171 1:55am- 130 1:33am- 85 1:17am- 60  2/26- 1:09am- 51  Patient states that she felt funny on 2/26 and only took 20 units of the Novolin R and Novolin N.  Normally patient takes 30 units of the Novolin R in the morning and at bedtime and 12 units at lunch.  Patient takes the Novolin N 30 units in the morning and at bedtime.   Also, patient states she will bring her logs by here on Monday to let us see all the sugars.

## 2016-03-10 NOTE — Telephone Encounter (Signed)
See note regarding sugars.

## 2016-03-11 ENCOUNTER — Telehealth: Payer: Self-pay

## 2016-03-11 NOTE — Telephone Encounter (Signed)
Called and notified patient of Dr.Gherghe's note.   

## 2016-03-11 NOTE — Telephone Encounter (Signed)
Called patient and advised of Dr.Gherghe's note. Patient understood and would discuss with her at the next visit. Patient had no questions at this time.

## 2016-03-14 ENCOUNTER — Other Ambulatory Visit: Payer: Self-pay | Admitting: Internal Medicine

## 2016-03-21 ENCOUNTER — Ambulatory Visit (INDEPENDENT_AMBULATORY_CARE_PROVIDER_SITE_OTHER): Payer: Medicare Other | Admitting: Orthopedic Surgery

## 2016-03-22 ENCOUNTER — Ambulatory Visit (INDEPENDENT_AMBULATORY_CARE_PROVIDER_SITE_OTHER): Payer: Medicare Other | Admitting: Orthopedic Surgery

## 2016-03-22 ENCOUNTER — Encounter (INDEPENDENT_AMBULATORY_CARE_PROVIDER_SITE_OTHER): Payer: Self-pay | Admitting: Orthopedic Surgery

## 2016-03-22 VITALS — Ht 65.0 in | Wt 226.0 lb

## 2016-03-22 DIAGNOSIS — L97511 Non-pressure chronic ulcer of other part of right foot limited to breakdown of skin: Secondary | ICD-10-CM

## 2016-03-22 DIAGNOSIS — B351 Tinea unguium: Secondary | ICD-10-CM

## 2016-03-22 NOTE — Progress Notes (Signed)
Office Visit Note   [name]           Date of Birth: 1945/01/13           MRN: [id number] Visit Date: 03/22/2016              Requested by: Lucretia Kern, DO [address] [place], [place] [address] PCP: Lucretia Kern., DO  Chief Complaint  Patient presents with  . Left Foot - Nail Problem  . Right Foot - Nail Problem    HPI: Patient is here today for bilateral nail and callus trim. She is in a post op shoe with a donut on the right and a sandal on the left full weight bearing. She does not have any questions or concerns today. Pamella Pert, RMA    Assessment & Plan: Visit Diagnoses:  1. Onychomycosis   2. Non-pressure chronic ulcer of other part of right foot limited to breakdown of skin ([place])     Plan: Nails trim 7 also debrided 1 a donut pad was applied patient is continuing with her postoperative shoe follow-up in 4 weeks  Follow-Up Instructions: Return in about 4 weeks (around 04/19/2016).   Ortho Exam On examination patient is alert oriented no adenopathy well-dressed normal affect normal respiratory effort she has antalgic gait she has good pulses bilaterally. Examination she has a Wagner grade 1 ulcer beneath the metatarsal heads midfoot on the right she has onychomycotic nails 7. There is no cellulitis no drainage no signs of infection. After informed consent 10 blade knife was used to debride the skin and soft tissue from the [facility] grade 1 ulcer on the right the ulcers 2 cm in diameter 1 mm deep after debridement. Examination of her nails she is thick and discolored onychomycotic nails 7 she is unable trim the nails on her own nails are trimmed times several no complications. ROS: Complete review of systems negative except as mentioned in history of present illness. Imaging: No results found.  Labs: Lab Results  Component Value Date   HGBA1C 9.0 10/26/2015   HGBA1C 8.7 02/02/2015   HGBA1C 8.7 (H) 02/28/2014   LABURIC 5.5 09/01/2015     LABORGA GROUP B STREP (S.AGALACTIAE) ISOLATED 12/26/2014    Orders:  No orders of the defined types were placed in this encounter.  No orders of the defined types were placed in this encounter.    Procedures: No procedures performed  Clinical Data: No additional findings.  Subjective: Review of Systems  Objective: Vital Signs: Ht 5\' 5"  (1.651 m)   Wt 226 lb (102.5 kg)   BMI 37.61 kg/m   Specialty Comments:  No specialty comments available.  PMFS History: Patient Active Problem List   Diagnosis Date Noted  . Tachycardia 03/04/2016  . Onychomycosis 12/07/2015  . Non-pressure chronic ulcer of other part of right foot limited to breakdown of skin ([place]) 12/07/2015  . Upper airway cough syndrome 03/05/2014  . S/P amputation of lesser toe - followed by Dr. Sharol Given 04/15/2013  . Pain in joint, shoulder region, s/p RTC surgery - followed by Dr. Sharol Given 04/15/2013  . ANEMIA, MILD 02/04/2010  . Essential hypertension 12/05/2006  . Uncontrolled type 2 diabetes mellitus with peripheral circulatory disorder ([place]) 10/12/2006  . Hyperlipidemia 10/12/2006  . Allergic rhinitis 10/12/2006  . GERD - Followed by Dr. Fuller Plan 10/12/2006  . IRRITABLE BOWEL SYNDROME - followed by Dr. Fuller Plan 10/12/2006  . Peripheral neuropathy ([place]) 10/11/2006  . ALOPECIA NEC 10/11/2006   Past  Medical History:  Diagnosis Date  . Alopecia   . Anemia, mild   . Chronic cough    sees pulmonologist  . Chronic pain    chest wall and abd - s/p extensive eval  . Diabetes mellitus with neuropathy ([place])    sees endocrine  . Diverticulosis   . GERD (gastroesophageal reflux disease)    takes Nexium bid, hx erosive esophagitis  . Headache(784.0)    occasionally;r/t sinus   . History of colon polyps   . HTN (hypertension)   . Hx of amputation of lesser toe ([place])    sees podiatrist  . Hyperlipemia   . IBS (irritable bowel syndrome)   . Insomnia    takes Elavil nightly  . Joint pain   . Osteomyelitis ([place])   .  Seasonal allergies    takes Zyrtec daily    Family History  Problem Relation Age of Onset  . Lung cancer Mother 80    smoked heavily  . Emphysema Mother   . Hypertension Father   . Hyperlipidemia Father   . Diabetes Father   . Coronary artery disease Father   . Dementia Father   . COPD Father     smoked  . Stomach cancer Paternal Aunt   . Brain cancer Paternal Uncle   . Irritable bowel syndrome      Several family members on fathers side   . Diabetes Other   . Stomach cancer Maternal Aunt   . Colon cancer Neg Hx     Past Surgical History:  Procedure Laterality Date  . AMPUTATION  12/28/2011   Procedure: AMPUTATION DIGIT;  Surgeon: Newt Minion, MD;  Location: [facility];  Service: Orthopedics;  Laterality: Right;  Right Foot 2nd Toe Amputation at MTP (metatarsophalangeal joint)  . AMPUTATION Right 04/25/2012   Procedure: Right Foot 3rd Toe Amputation;  Surgeon: Newt Minion, MD;  Location: [facility];  Service: Orthopedics;  Laterality: Right;  Right Foot Third Toe Amputation   . AMPUTATION Right 07/27/2012   Procedure: Right 4th Toe Amputation at Metatarsophalangeal;  Surgeon: Newt Minion, MD;  Location: [facility];  Service: Orthopedics;  Laterality: Right;  Right 4th Toe Amputation at Metatarsophalangeal  . COLONOSCOPY    . LAPAROSCOPIC APPENDECTOMY  01/05/2011   Procedure: APPENDECTOMY LAPAROSCOPIC;  Surgeon: Pedro Earls, MD;  [facility];  Service: General;  Laterality: N/A;  . OOPHORECTOMY  2001  . ROTATOR CUFF REPAIR Right   . TUBAL LIGATION    . VAGINAL HYSTERECTOMY  2001   Social History   Occupational History  . Retired     Social History Main Topics  . Smoking status: Never Smoker  . Smokeless tobacco: Never Used  . Alcohol use No  . Drug use: No  . Sexual activity: Yes    Birth control/ protection: Surgical

## 2016-03-25 ENCOUNTER — Ambulatory Visit (INDEPENDENT_AMBULATORY_CARE_PROVIDER_SITE_OTHER): Payer: Medicare Other | Admitting: Internal Medicine

## 2016-03-25 ENCOUNTER — Encounter: Payer: Self-pay | Admitting: Internal Medicine

## 2016-03-25 VITALS — BP 122/72 | HR 93 | Ht 65.5 in | Wt 227.0 lb

## 2016-03-25 DIAGNOSIS — E1151 Type 2 diabetes mellitus with diabetic peripheral angiopathy without gangrene: Secondary | ICD-10-CM

## 2016-03-25 DIAGNOSIS — E1165 Type 2 diabetes mellitus with hyperglycemia: Secondary | ICD-10-CM

## 2016-03-25 DIAGNOSIS — IMO0002 Reserved for concepts with insufficient information to code with codable children: Secondary | ICD-10-CM

## 2016-03-25 LAB — POCT GLYCOSYLATED HEMOGLOBIN (HGB A1C): Hemoglobin A1C: 7.7

## 2016-03-25 NOTE — Progress Notes (Signed)
Subjective:     Patient ID: Beth Holden, female   DOB: 1945/05/16, 71 y.o.   MRN: [id number]  HPI Beth Holden is a pleasant 71 y.o. 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 Last visit 5 mo ago.  Last HbA1C was: Lab Results  Component Value Date   HGBA1C 9.0 10/26/2015   HGBA1C 8.7 02/02/2015   HGBA1C 8.7 (H) 02/28/2014   She is now on: - Metformin 1000 mg bid - ReliOn N (long acting) and R (short acting) insulins as follows:  Insulin Before breakfast Before lunch Before dinner  Regular  20 units with a small meal  30 units with a larger meal  8 units with a smaller meal  10 units with a larger meal  20 units with a smaller meal  30 units with a larger meal  NPH 20 x 20 >> 30    If you have a snack at night, please add 4-5 units on Regular (R) insulin. She also tried: - Levemir 47 units in am and 37 units in pm >> $800 for 3 mo supply - Januvia 100 mg daily - $450 for 3 mo - Invokana 100 mg - $455 for 3 mo We discussed about starting her on a VGo mechanical pump in the past. She had an appointment with diabetes education but decided not to pursue it.  She checks her sugars ~3x a day - per log: - am: 167-308 >> 99,  112-201, 225 >> 130-325 >> 125-236 - 2h after b'fast  150-175 >> n/c >> 157-261 >> 301-349 >> 182-261, 313 - prelunch: 164 >> n/c >> 120, 157, 208, 283 >> 69 x1 >> 136-241, 317 - 2-3h after lunch: 200-250 >> 76-204 >> 143-233 >> 85, 166 >> 99-295, 332 - before dinner:205-212 >> 60, 77-184 >> 248, 257 >> 60 x1, 132-254 - after dinner:200-275 >> 191-267 >> 96, 154-240 >> n/c >> 330, 407 - bedtime: 261-287 >> 91-169, 236 >> 160, 178, 226, 281 >> 123-313 - nighttime: 46 x1, 98-214 Lowest: 54 - (2h after she took insulin and did not eat!), 79 >> 76 >> 60 >> 46 at night.  Has hypoglycemia in the 70's.   Highest: 407 (after  dinner).  Meals: - Breakfast: egg, bacon, toast; grits; biscuit; fruit; sometimes skips - Lunch: 1/2 PB sandwich or soup and yoghurt, sometimes grackers - Dinner: meat + vegetables + some starch - Snacks: fruit   - No CKD. She is on Enalapril. BUN  Date Value Ref Range Status  11/30/2015 14 6 - 23 mg/dL Final   Creatinine, Ser  Date Value Ref Range Status  11/30/2015 0.70 0.40 - 1.20 mg/dL Final  - Has numbness and tingling in feet - on and off Gabapentin and is on Amitriptyline. - no hyperlipidemia: Lab Results  Component Value Date   CHOL 166 11/30/2015   HDL 57.10 11/30/2015   LDLCALC 80 11/30/2015   TRIG 145.0 11/30/2015   CHOLHDL 3 11/30/2015  She is on Mevacor. - last eye exam in 02/26/2015 (dr Zadie Rhine): no DR, no glaucoma   Review of Systems Constitutional: + weight gain, no fatigue Eyes: + blurry vision, no xerophthalmia ENT: no sore throat, no nodules palpated in throat, no dysphagia/odynophagia, nohoarseness Cardiovascular: no CP/SOB/palpitations/leg swelling Respiratory: + cough/+ SOB/+ wheezing Gastrointestinal: no N/V/D/+ C/no heartburn Musculoskeletal: no muscle/joint aches Skin: no rash  I reviewed pt's medications, allergies, PMH, social hx,  family hx, and changes were documented in the history of present illness. Otherwise, unchanged from my initial visit note.  Objective:   Physical Exam Ht 5' 5.5" (1.664 m)   Wt 227 lb (103 kg)   BMI 37.20 kg/m  Body mass index is 37.2 kg/m. Wt Readings from Last 3 Encounters:  03/25/16 227 lb (103 kg)  03/22/16 226 lb (102.5 kg)  03/04/16 226 lb 8 oz (102.7 kg)   Constitutional: overweight, in NAD Eyes: PERRLA, EOMI, no exophthalmos ENT: moist mucous membranes, no thyromegaly, no cervical lymphadenopathy Cardiovascular: tachycardia, RR, No MRG, no LE edema. R foot in boot - has an ulcer on bottom of her foot >> sees Dr. Sharol Given monthly Respiratory: CTA B Gastrointestinal: abdomen soft, NT, ND,  BS+ Musculoskeletal: strength intact in all 4; missing right toes: 2,3,4;  Skin: moist, warm, no rashes  Assessment:     1. DM2, uncontrolled, insulin-dependent, with complications - diabetic peripheral neuropathy - 3 toe amputations - after infected diabetic toe ulcers >> OM:  R 2nd toe amputated on 12/28/2011  R 3rd toe amputated on 04/25/2012  R 4th toe amputated on 07/27/2012    Plan:     1. DM2 Patient with long-standing diabetes, inadequately controlled. HbA1c is better today, though, at 7.7%. - She has adjusted up her insulin doses since last visit. Despite this, her sugars are still high and quite fluctuating, more indicative of insulin deficiency (LADA pattern). Since sugars are still high, I suggested to add a sliding scale of regular insulin, and also increase her NPH in a.m. - I advised her to: Patient Instructions   Please adjust the insulin doses: Insulin Before breakfast Before lunch Before dinner  Regular  20 units with a small meal  30 units with a larger meal  8 units with a smaller meal  10 units with a larger meal  20 units with a smaller meal  30 units with a larger meal  NPH 25 x 30    If you have a snack at night, please add 4-5 units on Regular (R) insulin.  Please add the following sliding scale of R insulin: 150-200: + 3 unit 201-250: + 4 units 251-300: + 5 units >300: + 6 units  Please return in 3 months with your sugar log.   - She needs a new eye exam - given flu shot at last visit - return to clinic in 3 months with her sugar log  Philemon Kingdom, MD PhD [facility] Endocrinology

## 2016-03-25 NOTE — Patient Instructions (Addendum)
Please adjust the insulin doses: Insulin Before breakfast Before lunch Before dinner  Regular  20 units with a small meal  30 units with a larger meal  8 units with a smaller meal  10 units with a larger meal  20 units with a smaller meal  30 units with a larger meal  NPH 25 x 30    If you have a snack at night, please add 4-5 units on Regular (R) insulin.  Please add the following sliding scale of R insulin: 150-200: + 3 unit 201-250: + 4 units 251-300: + 5 units >300: + 6 units  Please return in 3 months with your sugar log.

## 2016-04-06 ENCOUNTER — Other Ambulatory Visit: Payer: Self-pay | Admitting: Internal Medicine

## 2016-04-11 DIAGNOSIS — H40003 Preglaucoma, unspecified, bilateral: Secondary | ICD-10-CM | POA: Diagnosis not present

## 2016-04-11 DIAGNOSIS — H43811 Vitreous degeneration, right eye: Secondary | ICD-10-CM | POA: Diagnosis not present

## 2016-04-11 DIAGNOSIS — H2513 Age-related nuclear cataract, bilateral: Secondary | ICD-10-CM | POA: Diagnosis not present

## 2016-04-11 DIAGNOSIS — E113292 Type 2 diabetes mellitus with mild nonproliferative diabetic retinopathy without macular edema, left eye: Secondary | ICD-10-CM | POA: Diagnosis not present

## 2016-04-11 LAB — HM DIABETES EYE EXAM

## 2016-04-13 ENCOUNTER — Telehealth: Payer: Self-pay

## 2016-04-13 NOTE — Telephone Encounter (Signed)
Called and notified patient that if she had problems with the Novolin N, that she could get it through [facility] without running it through insurance. Patient states if she has any problems she will tell them that, and I advised to call us if she has any other issues. Patient understood.

## 2016-04-18 ENCOUNTER — Ambulatory Visit (INDEPENDENT_AMBULATORY_CARE_PROVIDER_SITE_OTHER): Payer: Medicare Other | Admitting: Orthopedic Surgery

## 2016-04-18 ENCOUNTER — Encounter (INDEPENDENT_AMBULATORY_CARE_PROVIDER_SITE_OTHER): Payer: Self-pay | Admitting: Orthopedic Surgery

## 2016-04-18 DIAGNOSIS — G6289 Other specified polyneuropathies: Secondary | ICD-10-CM

## 2016-04-18 DIAGNOSIS — L97511 Non-pressure chronic ulcer of other part of right foot limited to breakdown of skin: Secondary | ICD-10-CM | POA: Diagnosis not present

## 2016-04-18 DIAGNOSIS — B351 Tinea unguium: Secondary | ICD-10-CM | POA: Diagnosis not present

## 2016-04-18 NOTE — Progress Notes (Signed)
Office Visit Note   [name]           Date of Birth: September 17, 1945           MRN: [id number] Visit Date: 04/18/2016              Requested by: Lucretia Kern, DO [address] [place], [place] [address] PCP: Lucretia Kern., DO  No chief complaint on file.     HPI: Patient presents in follow-up for both feet complaining of increasing pain from [facility] grade 1 ulcer of the right foot with pain from callus on the toes of her left foot and beneath the first metatarsal head of the left foot. Patient is currently wearing a donut pad around the ulcer on the right foot.  Assessment & Plan: Visit Diagnoses:  1. Non-pressure chronic ulcer of other part of right foot limited to breakdown of skin ([place])   2. Onychomycosis   3. Other polyneuropathy ([place])     Plan: Ulcer debridement of skin soft tissue on the right foot calluses. 2 on the left foot. Recommend that she go to [facility] supply to see if they would have a good postoperative shoe.  Follow-Up Instructions: No Follow-up on file.   Ortho Exam  Patient is alert, oriented, no adenopathy, well-dressed, normal affect, normal respiratory effort. Examination she does have an antalgic gait. She has callus on the lesser toes and underneath the first metatarsal head of the left foot. After informed consent a 10 blade knife was used to pair the callus without complications. There is no redness no cellulitis no signs of infection of left foot. Examination the right foot she has increased callus and nonviable tissue around the Wagner grade 1 ulcer beneath the second and third metatarsal heads. After informed consent a 10 blade knife was used to debride the skin and soft tissue back to healthy viable granulation tissue the wound is 10 mm in diameter and 0.1 mm deep with beefy granulation tissue this was touched with silver nitrate. Remainder of her foot shows no redness no cellulitis no signs of infection.  Imaging: No  results found.  Labs: Lab Results  Component Value Date   HGBA1C 7.7 03/25/2016   HGBA1C 9.0 10/26/2015   HGBA1C 8.7 02/02/2015   LABURIC 5.5 09/01/2015   LABORGA GROUP B STREP (S.AGALACTIAE) ISOLATED 12/26/2014    Orders:  No orders of the defined types were placed in this encounter.  No orders of the defined types were placed in this encounter.    Procedures: No procedures performed  Clinical Data: No additional findings.  ROS:  All other systems negative, except as noted in the HPI. Review of Systems  Objective: Vital Signs: There were no vitals taken for this visit.  Specialty Comments:  No specialty comments available.  PMFS History: Patient Active Problem List   Diagnosis Date Noted  . Tachycardia 03/04/2016  . Onychomycosis 12/07/2015  . Non-pressure chronic ulcer of other part of right foot limited to breakdown of skin ([place]) 12/07/2015  . Upper airway cough syndrome 03/05/2014  . S/P amputation of lesser toe - followed by Dr. Sharol Given 04/15/2013  . Pain in joint, shoulder region, s/p RTC surgery - followed by Dr. Sharol Given 04/15/2013  . ANEMIA, MILD 02/04/2010  . Essential hypertension 12/05/2006  . Uncontrolled type 2 diabetes mellitus with peripheral circulatory disorder ([place]) 10/12/2006  . Hyperlipidemia 10/12/2006  . Allergic rhinitis 10/12/2006  . GERD - Followed by Dr. Fuller Plan 10/12/2006  .  IRRITABLE BOWEL SYNDROME - followed by Dr. Fuller Plan 10/12/2006  . Peripheral neuropathy ([place]) 10/11/2006  . ALOPECIA NEC 10/11/2006   Past Medical History:  Diagnosis Date  . Alopecia   . Anemia, mild   . Chronic cough    sees pulmonologist  . Chronic pain    chest wall and abd - s/p extensive eval  . Diabetes mellitus with neuropathy ([place])    sees endocrine  . Diverticulosis   . GERD (gastroesophageal reflux disease)    takes Nexium bid, hx erosive esophagitis  . Headache(784.0)    occasionally;r/t sinus   . History of colon polyps   . HTN (hypertension)   .  Hx of amputation of lesser toe ([place])    sees podiatrist  . Hyperlipemia   . IBS (irritable bowel syndrome)   . Insomnia    takes Elavil nightly  . Joint pain   . Osteomyelitis ([place])   . Seasonal allergies    takes Zyrtec daily    Family History  Problem Relation Age of Onset  . Lung cancer Mother 36    smoked heavily  . Emphysema Mother   . Hypertension Father   . Hyperlipidemia Father   . Diabetes Father   . Coronary artery disease Father   . Dementia Father   . COPD Father     smoked  . Stomach cancer Paternal Aunt   . Brain cancer Paternal Uncle   . Irritable bowel syndrome      Several family members on fathers side   . Diabetes Other   . Stomach cancer Maternal Aunt   . Colon cancer Neg Hx     Past Surgical History:  Procedure Laterality Date  . AMPUTATION  12/28/2011   Procedure: AMPUTATION DIGIT;  Surgeon: Newt Minion, MD;  Location: [facility];  Service: Orthopedics;  Laterality: Right;  Right Foot 2nd Toe Amputation at MTP (metatarsophalangeal joint)  . AMPUTATION Right 04/25/2012   Procedure: Right Foot 3rd Toe Amputation;  Surgeon: Newt Minion, MD;  Location: [facility];  Service: Orthopedics;  Laterality: Right;  Right Foot Third Toe Amputation   . AMPUTATION Right 07/27/2012   Procedure: Right 4th Toe Amputation at Metatarsophalangeal;  Surgeon: Newt Minion, MD;  Location: [facility];  Service: Orthopedics;  Laterality: Right;  Right 4th Toe Amputation at Metatarsophalangeal  . COLONOSCOPY    . LAPAROSCOPIC APPENDECTOMY  01/05/2011   Procedure: APPENDECTOMY LAPAROSCOPIC;  Surgeon: Pedro Earls, MD;  Location: [facility];  Service: General;  Laterality: N/A;  . OOPHORECTOMY  2001  . ROTATOR CUFF REPAIR Right   . TUBAL LIGATION    . VAGINAL HYSTERECTOMY  2001   Social History   Occupational History  . Retired     Social History Main Topics  . Smoking status: Never Smoker  . Smokeless tobacco: Never Used  . Alcohol use No  . Drug use: No  . Sexual activity: Yes     Birth control/ protection: Surgical

## 2016-05-16 ENCOUNTER — Ambulatory Visit (INDEPENDENT_AMBULATORY_CARE_PROVIDER_SITE_OTHER): Payer: Medicare Other | Admitting: Orthopedic Surgery

## 2016-05-20 ENCOUNTER — Other Ambulatory Visit: Payer: Self-pay | Admitting: Internal Medicine

## 2016-05-20 ENCOUNTER — Ambulatory Visit (INDEPENDENT_AMBULATORY_CARE_PROVIDER_SITE_OTHER): Payer: Medicare Other | Admitting: Orthopedic Surgery

## 2016-05-20 ENCOUNTER — Encounter (INDEPENDENT_AMBULATORY_CARE_PROVIDER_SITE_OTHER): Payer: Self-pay | Admitting: Orthopedic Surgery

## 2016-05-20 VITALS — Ht 65.0 in | Wt 227.0 lb

## 2016-05-20 DIAGNOSIS — L97511 Non-pressure chronic ulcer of other part of right foot limited to breakdown of skin: Secondary | ICD-10-CM | POA: Diagnosis not present

## 2016-05-20 DIAGNOSIS — IMO0002 Reserved for concepts with insufficient information to code with codable children: Secondary | ICD-10-CM

## 2016-05-20 DIAGNOSIS — E1151 Type 2 diabetes mellitus with diabetic peripheral angiopathy without gangrene: Secondary | ICD-10-CM

## 2016-05-20 DIAGNOSIS — B351 Tinea unguium: Secondary | ICD-10-CM | POA: Diagnosis not present

## 2016-05-20 DIAGNOSIS — E1165 Type 2 diabetes mellitus with hyperglycemia: Secondary | ICD-10-CM

## 2016-05-20 NOTE — Progress Notes (Signed)
Office Visit Note   [name]           Date of Birth: [birth year]           MRN: [id number] Visit Date: 05/20/2016              Requested by: Lucretia Kern, DO [address] [place], [place] [address] PCP: Lucretia Kern, DO  Chief Complaint  Patient presents with  . Right Foot - Follow-up  . Left Foot - Follow-up      HPI: Patient is a 71 year old woman diabetic insensate neuropathy chronic Wagner grade 1 ulcer right forefoot with onychomycotic nails 7 and presents for evaluation she denies any problems at this time.  Assessment & Plan: Visit Diagnoses:  1. Non-pressure chronic ulcer of other part of right foot limited to breakdown of skin ([place])   2. Onychomycosis   3. Uncontrolled type 2 diabetes mellitus with peripheral circulatory disorder ([facility])     Plan: Nails trimmed 7 without complications. Also debrided of skin and soft tissue. Felt relieving pad applied following office in 6 weeks  Follow-Up Instructions: Return in about 6 weeks (around 07/01/2016).   Ortho Exam  Patient is alert, oriented, no adenopathy, well-dressed, normal affect, normal respiratory effort. Patient has an antalgic gait. Examination both feet have no cellulitis she has a callus beneath the second toe left foot this was  10 blade knife after informed consent. Patient is onychomycotic nails 7 she is unable safely trim nails on her own due to her diabetic insensate neuropathy and nails were trimmed 7 without complications. 7 is the right foot and she has a Wagner grade 1 ulcer. After informed consent a 10 blade knife was used to debride the skin and soft tissue back to healthy viable granulation tissue the ulcer is 2 cm in diameter and 1 mm deep there is good petechial tissue no exposed tendon or bone. Patient reapplied her own dressing.  Imaging: No results found.  Labs: Lab Results  Component Value Date   HGBA1C 7.7 03/25/2016   HGBA1C 9.0 10/26/2015   HGBA1C 8.7  02/02/2015   LABURIC 5.5 09/01/2015   LABORGA GROUP B STREP (S.AGALACTIAE) ISOLATED 12/26/2014    Orders:  No orders of the defined types were placed in this encounter.  No orders of the defined types were placed in this encounter.    Procedures: No procedures performed  Clinical Data: No additional findings.  ROS:  All other systems negative, except as noted in the HPI. Review of Systems  Objective: Vital Signs: Ht 5\' 5"  (1.651 m)   Wt 227 lb (103 kg)   BMI 37.77 kg/m   Specialty Comments:  No specialty comments available.  PMFS History: Patient Active Problem List   Diagnosis Date Noted  . Tachycardia 03/04/2016  . Onychomycosis 12/07/2015  . Non-pressure chronic ulcer of other part of right foot limited to breakdown of skin ([place]) 12/07/2015  . Upper airway cough syndrome 03/05/2014  . S/P amputation of lesser toe - followed by Dr. Sharol Given 04/15/2013  . Pain in joint, shoulder region, s/p RTC surgery - followed by Dr. Sharol Given 04/15/2013  . ANEMIA, MILD 02/04/2010  . Essential hypertension 12/05/2006  . Uncontrolled type 2 diabetes mellitus with peripheral circulatory disorder ([place]) 10/12/2006  . Hyperlipidemia 10/12/2006  . Allergic rhinitis 10/12/2006  . GERD - Followed by Dr. Fuller Plan 10/12/2006  . IRRITABLE BOWEL SYNDROME - followed by Dr. Fuller Plan 10/12/2006  . Peripheral neuropathy 10/11/2006  . ALOPECIA NEC  10/11/2006   Past Medical History:  Diagnosis Date  . Alopecia   . Anemia, mild   . Chronic cough    sees pulmonologist  . Chronic pain    chest wall and abd - s/p extensive eval  . Diabetes mellitus with neuropathy ([place])    sees endocrine  . Diverticulosis   . GERD (gastroesophageal reflux disease)    takes Nexium bid, hx erosive esophagitis  . Headache(784.0)    occasionally;r/t sinus   . History of colon polyps   . HTN (hypertension)   . Hx of amputation of lesser toe ([place])    sees podiatrist  . Hyperlipemia   . IBS (irritable bowel syndrome)     . Insomnia    takes Elavil nightly  . Joint pain   . Osteomyelitis ([place])   . Seasonal allergies    takes Zyrtec daily    Family History  Problem Relation Age of Onset  . Lung cancer Mother 33       smoked heavily  . Emphysema Mother   . Hypertension Father   . Hyperlipidemia Father   . Diabetes Father   . Coronary artery disease Father   . Dementia Father   . COPD Father        smoked  . Stomach cancer Paternal Aunt   . Brain cancer Paternal Uncle   . Irritable bowel syndrome Unknown        Several family members on fathers side   . Diabetes Other   . Stomach cancer Maternal Aunt   . Colon cancer Neg Hx     Past Surgical History:  Procedure Laterality Date  . AMPUTATION  12/28/2011   Procedure: AMPUTATION DIGIT;  Surgeon: Newt Minion, MD;  Location: [facility];  Service: Orthopedics;  Laterality: Right;  Right Foot 2nd Toe Amputation at MTP (metatarsophalangeal joint)  . AMPUTATION Right 04/25/2012   Procedure: Right Foot 3rd Toe Amputation;  Surgeon: Newt Minion, MD;  Location: [facility];  Service: Orthopedics;  Laterality: Right;  Right Foot Third Toe Amputation   . AMPUTATION Right 07/27/2012   Procedure: Right 4th Toe Amputation at Metatarsophalangeal;  Surgeon: Newt Minion, MD;  Location: [facility];  Service: Orthopedics;  Laterality: Right;  Right 4th Toe Amputation at Metatarsophalangeal  . COLONOSCOPY    . LAPAROSCOPIC APPENDECTOMY  01/05/2011   Procedure: APPENDECTOMY LAPAROSCOPIC;  Surgeon: Pedro Earls, MD;  [facility];  Service: General;  Laterality: N/A;  . OOPHORECTOMY  2001  . ROTATOR CUFF REPAIR Right   . TUBAL LIGATION    . VAGINAL HYSTERECTOMY  2001   Social History   Occupational History  . Retired     Social History Main Topics  . Smoking status: Never Smoker  . Smokeless tobacco: Never Used  . Alcohol use No  . Drug use: No  . Sexual activity: Yes    Birth control/ protection: Surgical

## 2016-05-23 ENCOUNTER — Other Ambulatory Visit: Payer: Self-pay

## 2016-05-23 MED ORDER — METFORMIN HCL 1000 MG PO TABS: ORAL_TABLET | ORAL | 0 refills | Status: DC

## 2016-06-03 ENCOUNTER — Ambulatory Visit (HOSPITAL_COMMUNITY)
Admission: RE | Admit: 2016-06-03 | Discharge: 2016-06-03 | Disposition: A | Discharge status: Home / Self Care | Payer: Medicare Other | Source: Ambulatory Visit | Attending: Cardiovascular Disease | Admitting: Cardiovascular Disease

## 2016-06-03 ENCOUNTER — Ambulatory Visit (INDEPENDENT_AMBULATORY_CARE_PROVIDER_SITE_OTHER): Payer: Medicare Other | Admitting: Family Medicine

## 2016-06-03 ENCOUNTER — Telehealth: Payer: Self-pay | Admitting: *Deleted

## 2016-06-03 ENCOUNTER — Encounter: Payer: Self-pay | Admitting: Family Medicine

## 2016-06-03 VITALS — BP 122/58 | HR 95 | Temp 98.5°F | Ht 65.0 in | Wt 229.6 lb

## 2016-06-03 DIAGNOSIS — M7989 Other specified soft tissue disorders: Secondary | ICD-10-CM

## 2016-06-03 DIAGNOSIS — M79662 Pain in left lower leg: Secondary | ICD-10-CM | POA: Diagnosis not present

## 2016-06-03 DIAGNOSIS — I872 Venous insufficiency (chronic) (peripheral): Secondary | ICD-10-CM

## 2016-06-03 DIAGNOSIS — I10 Essential (primary) hypertension: Secondary | ICD-10-CM | POA: Diagnosis not present

## 2016-06-03 DIAGNOSIS — E119 Type 2 diabetes mellitus without complications: Secondary | ICD-10-CM | POA: Diagnosis not present

## 2016-06-03 DIAGNOSIS — R300 Dysuria: Secondary | ICD-10-CM | POA: Diagnosis not present

## 2016-06-03 DIAGNOSIS — E785 Hyperlipidemia, unspecified: Secondary | ICD-10-CM | POA: Insufficient documentation

## 2016-06-03 LAB — POCT URINALYSIS DIPSTICK
Bilirubin, UA: NEGATIVE
Blood, UA: NEGATIVE
Glucose, UA: NEGATIVE
Ketones, UA: NEGATIVE
Leukocytes, UA: NEGATIVE
Nitrite, UA: NEGATIVE
Protein, UA: NEGATIVE
Spec Grav, UA: >=1.03 — AB (ref 1.010–1.025)
Urobilinogen, UA: 0.2 E.U./dL
pH, UA: 5 (ref 5.0–8.0)

## 2016-06-03 NOTE — Addendum Note (Signed)
Addended by: Agnes Lawrence on: 06/03/2016 03:18 PM   Modules accepted: Orders

## 2016-06-03 NOTE — Progress Notes (Signed)
HPI:  Acute visit for:  L leg swelling: -chronic bilat swelling, but worsening L leg sweling the last 2 days -also has som L post calf pain -no SOB, fevers, malaise, recent trauma, recent travel, immobility -ulcer on R foot - seeing podiatry, followed closely  Dysuria: -on and off this month -burning with urination, odor -no fevers, discharge, hematuria, nausea, vomiting, flank pain   ROS: See pertinent positives and negatives per HPI.  Past Medical History:  Diagnosis Date  . Alopecia   . Anemia, mild   . Chronic cough    sees pulmonologist  . Chronic pain    chest wall and abd - s/p extensive eval  . Diabetes mellitus with neuropathy ([place])    sees endocrine  . Diverticulosis   . GERD (gastroesophageal reflux disease)    takes Nexium bid, hx erosive esophagitis  . Headache(784.0)    occasionally;r/t sinus   . History of colon polyps   . HTN (hypertension)   . Hx of amputation of lesser toe ([place])    sees podiatrist  . Hyperlipemia   . IBS (irritable bowel syndrome)   . Insomnia    takes Elavil nightly  . Joint pain   . Osteomyelitis ([place])   . Seasonal allergies    takes Zyrtec daily    Past Surgical History:  Procedure Laterality Date  . AMPUTATION  12/28/2011   Procedure: AMPUTATION DIGIT;  Surgeon: Newt Minion, MD;  Location: [facility];  Service: Orthopedics;  Laterality: Right;  Right Foot 2nd Toe Amputation at MTP (metatarsophalangeal joint)  . AMPUTATION Right 04/25/2012   Procedure: Right Foot 3rd Toe Amputation;  Surgeon: Newt Minion, MD;  Location: [facility];  Service: Orthopedics;  Laterality: Right;  Right Foot Third Toe Amputation   . AMPUTATION Right 07/27/2012   Procedure: Right 4th Toe Amputation at Metatarsophalangeal;  Surgeon: Newt Minion, MD;  Location: [facility];  Service: Orthopedics;  Laterality: Right;  Right 4th Toe Amputation at Metatarsophalangeal  . COLONOSCOPY    . LAPAROSCOPIC APPENDECTOMY  01/05/2011   Procedure: APPENDECTOMY  LAPAROSCOPIC;  Surgeon: Pedro Earls, MD;  Location[facility];  Service: General;  Laterality: N/A;  . OOPHORECTOMY  2001  . ROTATOR CUFF REPAIR Right   . TUBAL LIGATION    . VAGINAL HYSTERECTOMY  2001    Family History  Problem Relation Age of Onset  . Lung cancer Mother 19       smoked heavily  . Emphysema Mother   . Hypertension Father   . Hyperlipidemia Father   . Diabetes Father   . Coronary artery disease Father   . Dementia Father   . COPD Father        smoked  . Stomach cancer Paternal Aunt   . Brain cancer Paternal Uncle   . Irritable bowel syndrome Unknown        Several family members on fathers side   . Diabetes Other   . Stomach cancer Maternal Aunt   . Colon cancer Neg Hx     Social History   Social History  . Marital status: Married    Spouse name: N/A  . Number of children: 2  . Years of education: N/A   Occupational History  . Retired     Social History Main Topics  . Smoking status: Never Smoker  . Smokeless tobacco: Never Used  . Alcohol use No  . Drug use: No  . Sexual activity: Yes    Birth control/ protection: Surgical  Other Topics Concern  . None   Social History Narrative   Caffeine daily    [employer], [employer] no diploma   Married '66   1 dtr- '78; 1 son '71; 2 grandchildren   Occupation: retired 04   Dad with alzheimers-had to place in [place] (summer '10)           Current Outpatient Prescriptions:  .  acetaminophen (TYLENOL) 500 MG tablet, Take 500 mg by mouth every 6 (six) hours as needed for mild pain. , Disp: , Rfl:  .  amitriptyline (ELAVIL) 25 MG tablet, TAKE ONE TABLET BY MOUTH AT BEDTIME, Disp: 90 tablet, Rfl: 3 .  cetirizine (ZYRTEC) 10 MG tablet, TAKE ONE TABLET BY MOUTH ONCE DAILY, Disp: 90 tablet, Rfl: 1 .  Cholecalciferol (VITAMIN D PO), Take by mouth., Disp: , Rfl:  .  Fluocinolone Acetonide (DERMOTIC) 0.01 % OIL, Place 1 drop in ear(s) daily as needed (itching). , Disp: , Rfl:  .  gabapentin (NEURONTIN) 300 MG  capsule, Take 2 tablets (600mg  total) by mouth at bedtime., Disp: 180 capsule, Rfl: 3 .  glucose blood (ONE TOUCH ULTRA TEST) test strip, USE TO TEST BLOOD SUGAR THREE TIMES DAILY AS INSTRUCTED., Disp: 300 each, Rfl: 1 .  insulin NPH Human (NOVOLIN N RELION) 100 UNIT/ML injection, Inject under skin 30 units in am and 30 units at bedtime, Disp: 20 mL, Rfl: 11 .  insulin regular (NOVOLIN R RELION) 250 units/2.77mL (100 units/mL) injection, INJECT 12 TO 30 UNITS INTO THE SKIN THREE TIMES DAILY BEFORE MEALS, Disp: 40 mL, Rfl: 11 .  losartan (COZAAR) 25 MG tablet, TAKE ONE TABLET BY MOUTH ONCE DAILY., Disp: 90 tablet, Rfl: 1 .  lovastatin (MEVACOR) 20 MG tablet, TAKE ONE TABLET BY MOUTH AT BEDTIME., Disp: 90 tablet, Rfl: 1 .  metFORMIN (GLUCOPHAGE) 1000 MG tablet, TAKE 1 TABLET TWICE A DAY WITH A MEAL, Disp: 180 tablet, Rfl: 0 .  metoprolol succinate (TOPROL XL) 25 MG 24 hr tablet, Take 1 tablet (25 mg total) by mouth daily., Disp: 30 tablet, Rfl: 11 .  omeprazole (PRILOSEC) 20 MG capsule, Take 2 capsules (40 mg total) by mouth 2 (two) times daily., Disp: 180 capsule, Rfl: 1 .  RELION INSULIN SYRINGE 1ML/31G 31G X 5/16" 1 ML MISC, USE 2 TIMES A DAY, Disp: 100 each, Rfl: 2  EXAM:  Vitals:   06/03/16 1430  BP: (!) 122/58  Pulse: 95  Temp: 98.5 F (36.9 C)    Body mass index is 38.21 kg/m.  GENERAL: vitals reviewed and listed above, alert, oriented, appears well hydrated and in no acute distress  HEENT: atraumatic, conjunttiva clear, no obvious abnormalities on inspection of external nose and ears  NECK: no obvious masses on inspection  LUNGS: clear to auscultation bilaterally, no wheezes, rales or rhonchi, good air movement  CV: HRRR, L > R 1 + pitting edema with L calf diameter >R, some pain to palpation over region deep veins L calf - no appreciable superficial thrombosis or ropy veins  ABD: no CVA TTP  MS: moves all extremities without noticeable abnormality  PSYCH: pleasant and  cooperative, no obvious depression or anxiety  ASSESSMENT AND PLAN:  Discussed the following assessment and plan:  Left leg swelling - Plan: VAS [place] LOWER EXTREMITY VENOUS (DVT)  Pain of left calf  Venous insufficiency  Dysuria  -discuss causes LE edema, her's may be related to chronic known venous insufficiency/mecahnical stress L due to change in gait from plantar ulcer R - but  opted for venous duplex to exclude clot -elevation and compression, healthy diet -check urine -otc yeast tx if urine ok, return advised if symptoms do not resolve -Patient advised to return or notify a doctor immediately if symptoms worsen or persist or new concerns arise.  Patient Instructions  BEFORE YOU LEAVE: -Udip with reflux -follow up: keep follow up as schedule -ordered urgent venous duplex  -We placed a referral for you as discussed for the venous duplex. This should be done soon.   -elevated leg above heart, compression, healthy low sodium diet  I hope you are feeling better soon! Seek care immediately if worsening, new concerns or you are not improving with treatment.       Colin Benton R., DO

## 2016-06-03 NOTE — Addendum Note (Signed)
Addended by: Tomi Likens on: 06/03/2016 05:09 PM   Modules accepted: Orders

## 2016-06-03 NOTE — Patient Instructions (Signed)
BEFORE YOU LEAVE: -Udip with reflux -follow up: keep follow up as schedule -ordered urgent venous duplex  -We placed a referral for you as discussed for the venous duplex. This should be done soon.   -elevated leg above heart, compression, healthy low sodium diet  I hope you are feeling better soon! Seek care immediately if worsening, new concerns or you are not improving with treatment.

## 2016-06-03 NOTE — Telephone Encounter (Signed)
Varney Biles called from [facility] to let Dr Maudie Mercury know the doppler is negative for a DVT of the lowe extremity and the pt has left the office.  Message sent to Dr Maudie Mercury.

## 2016-06-03 NOTE — Addendum Note (Signed)
Addended by: Tomi Likens on: 06/03/2016 05:08 PM   Modules accepted: Orders

## 2016-06-04 LAB — URINALYSIS, ROUTINE W REFLEX MICROSCOPIC
Bilirubin Urine: NEGATIVE
Hgb urine dipstick: NEGATIVE
Ketones, ur: NEGATIVE
Nitrite: NEGATIVE
Protein, ur: NEGATIVE
Specific Gravity, Urine: 1.021 (ref 1.001–1.035)
pH: 5 (ref 5.0–8.0)

## 2016-06-04 LAB — URINALYSIS, MICROSCOPIC ONLY
Bacteria, UA: NONE SEEN [HPF]
Casts: NONE SEEN [LPF]
Crystals: NONE SEEN [HPF]
RBC / HPF: NONE SEEN RBC/HPF (ref <=2)
Yeast: NONE SEEN [HPF]

## 2016-06-20 ENCOUNTER — Encounter: Payer: Self-pay | Admitting: Internal Medicine

## 2016-06-20 ENCOUNTER — Ambulatory Visit (INDEPENDENT_AMBULATORY_CARE_PROVIDER_SITE_OTHER): Payer: Medicare Other | Admitting: Internal Medicine

## 2016-06-20 VITALS — BP 122/72 | HR 84 | Ht 65.0 in | Wt 228.0 lb

## 2016-06-20 DIAGNOSIS — E1151 Type 2 diabetes mellitus with diabetic peripheral angiopathy without gangrene: Secondary | ICD-10-CM | POA: Diagnosis not present

## 2016-06-20 DIAGNOSIS — IMO0002 Reserved for concepts with insufficient information to code with codable children: Secondary | ICD-10-CM

## 2016-06-20 DIAGNOSIS — E1165 Type 2 diabetes mellitus with hyperglycemia: Secondary | ICD-10-CM

## 2016-06-20 LAB — POCT GLYCOSYLATED HEMOGLOBIN (HGB A1C): Hemoglobin A1C: 7.6

## 2016-06-20 MED ORDER — GABAPENTIN 100 MG PO CAPS: ORAL_CAPSULE | ORAL | 1 refills | Status: DC

## 2016-06-20 NOTE — Progress Notes (Signed)
Subjective:     Patient ID: Beth Holden, female   DOB: 1945-09-17, 71 y.o.   MRN: [id number]  HPI [name] is a pleasant 71 y.o. 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 Last visit 3 mo ago.  She has L leg swelling and pain (Doppler negative, had a small Bakers cyst).   Her R foot sole ulcer is healing >> has callus >> trimmed by Dr. Sharol Given.  Last HbA1C was: Lab Results  Component Value Date   HGBA1C 7.7 03/25/2016   HGBA1C 9.0 10/26/2015   HGBA1C 8.7 02/02/2015   She is now on: - Metformin 1000 mg bid - ReliOn N (long acting) and R (short acting) insulins as follows:  Insulin Before breakfast Before lunch Before dinner  Regular  20 units with a small meal  30 units with a larger meal  8 units with a smaller meal  10 units with a larger meal  20 units with a smaller meal  30 units with a larger meal  NPH 25 x 30    If you have a snack at night, please add 4-5 units on Regular (R) insulin. She also tried: - Levemir 47 units in am and 37 units in pm >> $800 for 3 mo supply - Januvia 100 mg daily - $450 for 3 mo - Invokana 100 mg - $455 for 3 mo - Took Byetta before We discussed about starting her on a VGo mechanical pump in the past. She had an appointment with diabetes education but decided not to pursue it.  She checks her sugars ~3x a day:  - am: 167-308 >> 99,  112-201, 225 >> 130-325 >> 125-236 >> 83-180, 250 - 2h after b'fast  150-175 >> n/c >> 157-261 >> 301-349 >> 182-261, 313 >> 176-197, 305 - prelunch: 164 >> n/c >> 120, 157, 208, 283 >> 69 x1 >> 136-241, 317 >> n/c - 2-3h after lunch: 85, 166 >> 99-295, 332 >> 52 (took 12 units with lunch), 124-202, 265 - before dinner:205-212 >> 60, 77-184 >> 248, 257 >> 60 x1, 132-254 >> 54-245 - after dinner:200-275 >> 191-267 >> 96, 154-240 >> n/c >> 330, 407 >> 104-226 -  bedtime: 261-287 >> 91-169, 236 >> 160, 178, 226, 281 >> 123-313 >> 136-353 - nighttime: 46 x1, 98-214 >> 56 Lowest: 54, 79 >> 76 >> 60 >> 46 at night >> 55 at bedtime.Marland Kitchen  Has hypoglycemia in the 70's.   Highest: 407 (after dinner) >> 374 (forgot insulin)  Meals: - Breakfast: egg, bacon, toast; grits; biscuit; fruit; sometimes skips - Lunch: 1/2 PB sandwich or soup and yoghurt, sometimes crackers - Dinner: meat + vegetables + some starch - Snacks: fruit   - No CKD. BUN  Date Value Ref Range Status  11/30/2015 14 6 - 23 mg/dL Final   Creatinine, Ser  Date Value Ref Range Status  11/30/2015 0.70 0.40 - 1.20 mg/dL Final   She is on Enalapril. - She has numbness and tingling in feet - on Gabapentin >> taking 1x 300 mg at night and is also on Amitriptyline >> trying to come off >> tapering down. - Latest lipid panel: Lab Results  Component Value Date   CHOL 166 11/30/2015   HDL 57.10 11/30/2015   LDLCALC 80 11/30/2015   TRIG 145.0 11/30/2015   CHOLHDL 3 11/30/2015  She is on Mevacor.  - last eye exam in  02/2016 (Dr. Zadie Rhine): + DR (mild), no glaucoma   Review of Systems Constitutional: + weight gain, + fatigue, +subjective hyperthermia, no subjective hypothermia Eyes: no blurry vision, no xerophthalmia ENT: no sore throat, no nodules palpated in throat, no dysphagia, no odynophagia, no hoarseness Cardiovascular: no CP/no SOB/no palpitations/+ L leg swelling Respiratory: + cough/no SOB/no wheezing Gastrointestinal: no N/no V/no D/+ C/no acid reflux Musculoskeletal: no muscle aches/no joint aches Skin: no rashes, no hair loss Neurological: no tremors/no numbness/no tingling/no dizziness  I reviewed pt's medications, allergies, PMH, social hx, family hx, and changes were documented in the history of present illness. Otherwise, unchanged from my initial visit note.  Objective:   Physical Exam BP 122/72   Pulse 84   Ht 5\' 5"  (1.651 m)   Wt 228 lb (103.4 kg)   SpO2 96%   BMI  37.94 kg/m  Body mass index is 37.94 kg/m. Wt Readings from Last 3 Encounters:  06/20/16 228 lb (103.4 kg)  06/03/16 229 lb 9.6 oz (104.1 kg)  05/20/16 227 lb (103 kg)   Constitutional: overweight, in NAD Eyes: PERRLA, EOMI, no exophthalmos ENT: moist mucous membranes, no thyromegaly, no cervical lymphadenopathy Cardiovascular: RRR, No MRG, + L leg with edema up to knee Respiratory: CTA B Gastrointestinal: abdomen soft, NT, ND, BS+ Musculoskeletal:  strength intact in all 4, missing right toes: 2,3,4 Skin: moist, warm, no rashes; + healing ulcer on R sole >> large callus Neurological: no tremor with outstretched hands, DTR normal in all 4   Assessment:     1. 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 DM2 Patient with long-standing diabetes, inadequately controlled. Her sugars are very fluctuating, more indicative of insulin deficiency (LADA pattern). She is occasionally having high CBGs if she forgets insulin, but this is not frequent. As sugars bin the pm can be low (52 in the office today >> given juice, crackers >> increased to 75) >> will advise her not to take more R insulin with lunch as advised (today she took 12 units!)  And will also reduce her SSI. As sugars at night can occasionally be in the 50s >> will try to move NPH at bedtime and I advised her to let me know if she still has lows at night >> we may need to decrease the dose then). - She has adjusted up her insulin doses since last visit. Despite this, her sugars are still high and quite fluctuating Since sugars are still high, I suggested to add a sliding scale of regular insulin, and also increase her NPH in a.m. - I advised her to: Patient Instructions   Please  stop Amitriptyline.  Try to decrease Gabapentin dose to 200 mg at night and may try to decrease further to 100 mg daily if you can.  Please try to move NPH at bedtime: Insulin Before breakfast Before lunch Before dinner At bedtime  Regular  20 units with a small meal  30 units with a larger meal  8 units with a smaller meal  10 units with a larger meal  20 units with a smaller meal  30 units with a larger meal   NPH 25 x x 30    If you have  a snack at night, please add 3-4 units of Regular (R) insulin.  Let's change the Sliding scale of R insulin: 150-200: + 2 unit 201-250: + 3 units 251-300: + 4 units >300: + 5 units  Please return in 3 months with your sugar log.  - today, HbA1c is 7.6% (slightly better)  - continue checking sugars at different times of the day - check 3x a day, rotating checks - advised for yearly eye exams >> she is UTD - Return to clinic in 3 mo with sugar log   2. 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

## 2016-06-20 NOTE — Patient Instructions (Addendum)
Please stop Amitriptyline.  Try to decrease Gabapentin dose to 200 mg at night and may try to decrease further to 100 mg daily if you can.  Please try to move NPH at bedtime: Insulin Before breakfast Before lunch Before dinner At bedtime  Regular  20 units with a small meal  30 units with a larger meal  8 units with a smaller meal  10 units with a larger meal  20 units with a smaller meal  30 units with a larger meal   NPH 25 x x 30    If you have a snack at night, please add 3-4 units of Regular (R) insulin.  Let's change the Sliding scale of R insulin: 150-200: + 2 unit 201-250: + 3 units 251-300: + 4 units >300: + 5 units  Please return in 3 months with your sugar log.

## 2016-06-27 ENCOUNTER — Ambulatory Visit (INDEPENDENT_AMBULATORY_CARE_PROVIDER_SITE_OTHER): Payer: Medicare Other | Admitting: Orthopedic Surgery

## 2016-07-01 ENCOUNTER — Ambulatory Visit (INDEPENDENT_AMBULATORY_CARE_PROVIDER_SITE_OTHER): Payer: Medicare Other | Admitting: Orthopedic Surgery

## 2016-07-01 ENCOUNTER — Encounter (INDEPENDENT_AMBULATORY_CARE_PROVIDER_SITE_OTHER): Payer: Self-pay | Admitting: Orthopedic Surgery

## 2016-07-01 VITALS — Ht 65.0 in | Wt 228.0 lb

## 2016-07-01 DIAGNOSIS — L97511 Non-pressure chronic ulcer of other part of right foot limited to breakdown of skin: Secondary | ICD-10-CM | POA: Diagnosis not present

## 2016-07-01 DIAGNOSIS — E1165 Type 2 diabetes mellitus with hyperglycemia: Secondary | ICD-10-CM

## 2016-07-01 DIAGNOSIS — B351 Tinea unguium: Secondary | ICD-10-CM

## 2016-07-01 DIAGNOSIS — IMO0002 Reserved for concepts with insufficient information to code with codable children: Secondary | ICD-10-CM

## 2016-07-01 DIAGNOSIS — E1151 Type 2 diabetes mellitus with diabetic peripheral angiopathy without gangrene: Secondary | ICD-10-CM

## 2016-07-01 NOTE — Progress Notes (Signed)
Office Visit Note   [name]           Date of Birth: 22-Feb-1945           MRN: [id number] Visit Date: 07/01/2016              Requested by: Lucretia Kern, DO [address] [place], [place] [address] PCP: Lucretia Kern, DO  Chief Complaint  Patient presents with  . Right Foot - Nail Problem  . Left Foot - Nail Problem      HPI: Patient presents for minor grade 1 ulcers 2 both feet as well as onychomycotic nails 7. Patient states she's noticed increasing callus from the ulcer on the right foot she complains of pain from her onychomycotic nails. She states she did have some swelling in her legs and ultrasound was negative for DVT.  Assessment & Plan: Visit Diagnoses:  1. Uncontrolled type 2 diabetes mellitus with peripheral circulatory disorder ([place])   2. Non-pressure chronic ulcer of other part of right foot limited to breakdown of skin ([place])   3. Onychomycosis     Plan: Nails trims 6 without complications. Ulcer debridement of skin and soft tissue 2 donut pad and Band-Aid was reapplied follow-up in 4 weeks  Follow-Up Instructions: Return in about 4 weeks (around 07/29/2016).   Ortho Exam  Patient is alert, oriented, no adenopathy, well-dressed, normal affect, normal respiratory effort. Examination patient has an antalgic gait she does have some venous stasis swelling of both legs but no open ulcers. She has a Wagner grade 1 ulcer beneath the third and fourth metatarsal head right foot. Informed consent a 10 blade knife was used to debride the skin and soft tissue back to bleeding viable granulation tissue silver nitrate was used for hemostasis the ulcers 15 mm in diameter and 1 mm deep. Patient reapplied or Band-Aid. Examination the left foot she has an ulcer of the tip of the second toe. This was also debrided of skin and soft tissue with a 10 blade knife the ulcer is approximately 7 mm in diameter and 1 mm deep there is good healthy granulation tissue no  signs of exposed bone or infection. She has thick discolored onychomycotic nails 6 she is unable safe position and the nails are trimmed 6 without complications.  Imaging: No results found.  Labs: Lab Results  Component Value Date   HGBA1C 7.6 06/20/2016   HGBA1C 7.7 03/25/2016   HGBA1C 9.0 10/26/2015   LABURIC 5.5 09/01/2015   LABORGA GROUP B STREP (S.AGALACTIAE) ISOLATED 12/26/2014    Orders:  No orders of the defined types were placed in this encounter.  No orders of the defined types were placed in this encounter.    Procedures: No procedures performed  Clinical Data: No additional findings.  ROS:  All other systems negative, except as noted in the HPI. Review of Systems  Objective: Vital Signs: Ht 5\' 5"  (1.651 m)   Wt 228 lb (103.4 kg)   BMI 37.94 kg/m   Specialty Comments:  No specialty comments available.  PMFS History: Patient Active Problem List   Diagnosis Date Noted  . Tachycardia 03/04/2016  . Onychomycosis 12/07/2015  . Non-pressure chronic ulcer of other part of right foot limited to breakdown of skin ([place]) 12/07/2015  . Upper airway cough syndrome 03/05/2014  . S/P amputation of lesser toe - followed by Dr. Sharol Given 04/15/2013  . Pain in joint, shoulder region, s/p RTC surgery - followed by Dr. Sharol Given 04/15/2013  .  ANEMIA, MILD 02/04/2010  . Essential hypertension 12/05/2006  . Uncontrolled type 2 diabetes mellitus with peripheral circulatory disorder ([place]) 10/12/2006  . Hyperlipidemia 10/12/2006  . Allergic rhinitis 10/12/2006  . GERD - Followed by Dr. Fuller Plan 10/12/2006  . IRRITABLE BOWEL SYNDROME - followed by Dr. Fuller Plan 10/12/2006  . Peripheral neuropathy 10/11/2006  . ALOPECIA NEC 10/11/2006   Past Medical History:  Diagnosis Date  . Alopecia   . Anemia, mild   . Chronic cough    sees pulmonologist  . Chronic pain    chest wall and abd - s/p extensive eval  . Diabetes mellitus with neuropathy ([place])    sees endocrine  . Diverticulosis    . GERD (gastroesophageal reflux disease)    takes Nexium bid, hx erosive esophagitis  . Headache(784.0)    occasionally;r/t sinus   . History of colon polyps   . HTN (hypertension)   . Hx of amputation of lesser toe ([place])    sees podiatrist  . Hyperlipemia   . IBS (irritable bowel syndrome)   . Insomnia    takes Elavil nightly  . Joint pain   . Osteomyelitis ([place])   . Seasonal allergies    takes Zyrtec daily    Family History  Problem Relation Age of Onset  . Lung cancer Mother 1       smoked heavily  . Emphysema Mother   . Hypertension Father   . Hyperlipidemia Father   . Diabetes Father   . Coronary artery disease Father   . Dementia Father   . COPD Father        smoked  . Stomach cancer Paternal Aunt   . Brain cancer Paternal Uncle   . Irritable bowel syndrome Unknown        Several family members on fathers side   . Diabetes Other   . Stomach cancer Maternal Aunt   . Colon cancer Neg Hx     Past Surgical History:  Procedure Laterality Date  . AMPUTATION  12/28/2011   Procedure: AMPUTATION DIGIT;  Surgeon: Newt Minion, MD;  Location: [place];  Service: Orthopedics;  Laterality: Right;  Right Foot 2nd Toe Amputation at MTP (metatarsophalangeal joint)  . AMPUTATION Right 04/25/2012   Procedure: Right Foot 3rd Toe Amputation;  Surgeon: Newt Minion, MD;  Location: [place];  Service: Orthopedics;  Laterality: Right;  Right Foot Third Toe Amputation   . AMPUTATION Right 07/27/2012   Procedure: Right 4th Toe Amputation at Metatarsophalangeal;  Surgeon: Newt Minion, MD;  Location: [place];  Service: Orthopedics;  Laterality: Right;  Right 4th Toe Amputation at Metatarsophalangeal  . COLONOSCOPY    . LAPAROSCOPIC APPENDECTOMY  01/05/2011   Procedure: APPENDECTOMY LAPAROSCOPIC;  Surgeon: Pedro Earls, MD;  Location[facility];  Service: General;  Laterality: N/A;  . OOPHORECTOMY  2001  . ROTATOR CUFF REPAIR Right   . TUBAL LIGATION    . VAGINAL HYSTERECTOMY  2001    Social History   Occupational History  . Retired     Social History Main Topics  . Smoking status: Never Smoker  . Smokeless tobacco: Never Used  . Alcohol use No  . Drug use: No  . Sexual activity: Yes    Birth control/ protection: Surgical

## 2016-07-04 ENCOUNTER — Ambulatory Visit: Payer: Medicare Other | Admitting: Family Medicine

## 2016-07-10 DIAGNOSIS — M7120 Synovial cyst of popliteal space [Baker], unspecified knee: Secondary | ICD-10-CM | POA: Insufficient documentation

## 2016-07-10 NOTE — Progress Notes (Signed)
HPI:  Beth Holden is a pleasant 71 y.o. here for follow up. Chronic medical problems summarized below were reviewed for changes. She has developed redness and swelling of the 2nd digit on her L foot and is seeing her podiatrist today for this. She has chronic LE edema and the L leg has been more swollen. She does not get and exercise and diet is poor. Denies CP, fever, SOB, DOE, treatment intolerance or new symptoms. Diabetes labs improving, but still above goal.    Diabetes: -complication: circulatory, neurological, diabetic foot ulcer -sees endocrinologist and podiatrist -meds: metformin, gabapentin, elavil, isulin  Obesity, Hyperlipidemia, HTN: -meds: toprol XL (added by cardiology), losartan?, statin  GERD/IBS: -sees GI  Palpitations: -saw cardiologist per her request 12/2015 -metoprolol added  ROS: See pertinent positives and negatives per HPI.  Past Medical History:  Diagnosis Date  . Alopecia   . Anemia, mild   . Chronic cough    sees pulmonologist  . Chronic pain    chest wall and abd - s/p extensive eval  . Diabetes mellitus with neuropathy ([place])    sees endocrine  . Diverticulosis   . GERD (gastroesophageal reflux disease)    takes Nexium bid, hx erosive esophagitis  . Headache(784.0)    occasionally;r/t sinus   . History of colon polyps   . HTN (hypertension)   . Hx of amputation of lesser toe ([place])    sees podiatrist  . Hyperlipemia   . IBS (irritable bowel syndrome)   . Insomnia    takes Elavil nightly  . Joint pain   . Osteomyelitis ([place])   . Seasonal allergies    takes Zyrtec daily    Past Surgical History:  Procedure Laterality Date  . AMPUTATION  12/28/2011   Procedure: AMPUTATION DIGIT;  Surgeon: Newt Minion, MD;  Location: [place];  Service: Orthopedics;  Laterality: Right;  Right Foot 2nd Toe Amputation at MTP (metatarsophalangeal joint)  . AMPUTATION Right 04/25/2012   Procedure: Right Foot 3rd Toe Amputation;  Surgeon: Newt Minion, MD;  Location: [place] [facility];  Service: Orthopedics;  Laterality: Right;  Right Foot Third Toe Amputation   . AMPUTATION Right 07/27/2012   Procedure: Right 4th Toe Amputation at Metatarsophalangeal;  Surgeon: Newt Minion, MD;  Location: [place];  Service: Orthopedics;  Laterality: Right;  Right 4th Toe Amputation at Metatarsophalangeal  . COLONOSCOPY    . LAPAROSCOPIC APPENDECTOMY  01/05/2011   Procedure: APPENDECTOMY LAPAROSCOPIC;  Surgeon: Pedro Earls, MD;  Location[facility];  Service: General;  Laterality: N/A;  . OOPHORECTOMY  2001  . ROTATOR CUFF REPAIR Right   . TUBAL LIGATION    . VAGINAL HYSTERECTOMY  2001    Family History  Problem Relation Age of Onset  . Lung cancer Mother 90       smoked heavily  . Emphysema Mother   . Hypertension Father   . Hyperlipidemia Father   . Diabetes Father   . Coronary artery disease Father   . Dementia Father   . COPD Father        smoked  . Stomach cancer Paternal Aunt   . Brain cancer Paternal Uncle   . Irritable bowel syndrome Unknown        Several family members on fathers side   . Diabetes Other   . Stomach cancer Maternal Aunt   . Colon cancer Neg Hx     Social History   Social History  . Marital status: Married    Spouse  name: N/A  . Number of children: 2  . Years of education: N/A   Occupational History  . Retired     Social History Main Topics  . Smoking status: Never Smoker  . Smokeless tobacco: Never Used  . Alcohol use No  . Drug use: No  . Sexual activity: Yes    Birth control/ protection: Surgical   Other Topics Concern  . None   Social History Narrative   Caffeine daily    [employer], [employer] no diploma   Married '66   1 dtr- '78; 1 son '71; 2 grandchildren   Occupation: retired 04   Dad with alzheimers-had to place in [place] (summer '10)           Current Outpatient Prescriptions:  .  acetaminophen (TYLENOL) 500 MG tablet, Take 500 mg by mouth every 6 (six) hours as needed for mild pain. , Disp: , Rfl:   .  amitriptyline (ELAVIL) 25 MG tablet, , Disp: , Rfl:  .  cetirizine (ZYRTEC) 10 MG tablet, TAKE ONE TABLET BY MOUTH ONCE DAILY, Disp: 90 tablet, Rfl: 1 .  Cholecalciferol (VITAMIN D PO), Take by mouth., Disp: , Rfl:  .  Fluocinolone Acetonide (DERMOTIC) 0.01 % OIL, Place 1 drop in ear(s) daily as needed (itching). , Disp: , Rfl:  .  gabapentin (NEURONTIN) 100 MG capsule, Take 2 tablets (600mg  total) by mouth at bedtime., Disp: 180 capsule, Rfl: 1 .  glucose blood (ONE TOUCH ULTRA TEST) test strip, USE TO TEST BLOOD SUGAR THREE TIMES DAILY AS INSTRUCTED., Disp: 300 each, Rfl: 1 .  insulin NPH Human (NOVOLIN N RELION) 100 UNIT/ML injection, Inject under skin 30 units in am and 30 units at bedtime, Disp: 20 mL, Rfl: 11 .  insulin regular (NOVOLIN R RELION) 250 units/2.60mL (100 units/mL) injection, INJECT 12 TO 30 UNITS INTO THE SKIN THREE TIMES DAILY BEFORE MEALS, Disp: 40 mL, Rfl: 11 .  losartan (COZAAR) 25 MG tablet, TAKE ONE TABLET BY MOUTH ONCE DAILY., Disp: 90 tablet, Rfl: 1 .  lovastatin (MEVACOR) 20 MG tablet, TAKE ONE TABLET BY MOUTH AT BEDTIME., Disp: 90 tablet, Rfl: 1 .  metFORMIN (GLUCOPHAGE) 1000 MG tablet, TAKE 1 TABLET TWICE A DAY WITH A MEAL, Disp: 180 tablet, Rfl: 0 .  metoprolol succinate (TOPROL XL) 25 MG 24 hr tablet, Take 1 tablet (25 mg total) by mouth daily., Disp: 30 tablet, Rfl: 11 .  omeprazole (PRILOSEC) 20 MG capsule, Take 2 capsules (40 mg total) by mouth 2 (two) times daily., Disp: 180 capsule, Rfl: 1 .  RELION INSULIN SYRINGE 1ML/31G 31G X 5/16" 1 ML MISC, USE 2 TIMES A DAY, Disp: 100 each, Rfl: 2  EXAM:  Vitals:   07/11/16 0949  BP: 118/70  Pulse: 86  Temp: 98.1 F (36.7 C)    Body mass index is 38.12 kg/m.  GENERAL: vitals reviewed and listed above, alert, oriented, appears well hydrated and in no acute distress  HEENT: atraumatic, conjunttiva clear, no obvious abnormalities on inspection of external nose and ears  NECK: no obvious masses on  inspection  LUNGS: clear to auscultation bilaterally, no wheezes, rales or rhonchi, good air movement  CV: HRRR, bilat low ext edema L > R  MS: moves all extremities without noticeable abnormality  SKIN: callus/erythema/swelling L 2nd digit   PSYCH: pleasant and cooperative, no obvious depression or anxiety  ASSESSMENT AND PLAN:  Discussed the following assessment and plan:  Uncontrolled type 2 diabetes mellitus with peripheral circulatory disorder (HCC) -sees endo  for management -lengthy discussion healthy diet and exercise she can do despite her foot  Hyperlipidemia, unspecified hyperlipidemia type -stable  Essential hypertension - Plan: Basic metabolic panel, CBC -labs per orders  Non-pressure chronic ulcer of other part of right foot limited to breakdown of skin ([place]) -agree with urgent eval with podiatrist - she sees them today -we discussed lasix short course, elevation and compression for edema and she would prefer to discuss with her podiatrist first  Leg edema -discussed tx options, see above, will check BMP - if renal function ok and she agrees will do short course lasix - she wants to discussed with podiatrist -elevation, consider compression -lifestyle recs  -Patient advised to return or notify a doctor immediately if symptoms worsen or persist or new concerns arise.  Patient Instructions  BEFORE YOU LEAVE: -labs -follow up: 3 months  See your foot specialist today.  Elevated legs. Ask your foot doctor about compression and lasix to help with swelling.   We recommend the following healthy lifestyle for LIFE: 1) Regular meals - Small portions.   Tip: eat off of a salad plate instead of a dinner plate.  2) Eat a healthy clean diet.   TRY TO EAT: -at least 5-7 servings of low sugar vegetables per day (not corn, potatoes or bananas.) -berries are the best choice if you wish to eat fruit.   -lean meets (fish, chicken or [place] breasts) -vegan proteins  for some meals - beans or tofu, whole grains, nuts and seeds -Replace bad fats with good fats - good fats include: fish, nuts and seeds, canola oil, olive oil -small amounts of low fat or non fat dairy -small amounts of100 % whole grains - check the lables  AVOID: -SUGAR, sweets, anything with added sugar, corn syrup or sweeteners -if you must have a sweetener, small amounts of stevia may be best -sweetened beverages -simple starches (rice, bread, potatoes, pasta, chips, etc - small amounts of 100% whole grains are ok) -red meat, pork, butter -fried foods, fast food, processed food, excessive dairy, eggs and coconut.  3)Get at least 150 minutes of sweaty aerobic exercise per week.  4)Reduce stress - consider counseling, meditation and relaxation to balance other aspects of your life.     Colin Benton R., DO

## 2016-07-11 ENCOUNTER — Ambulatory Visit (INDEPENDENT_AMBULATORY_CARE_PROVIDER_SITE_OTHER): Payer: Medicare Other | Admitting: Family Medicine

## 2016-07-11 ENCOUNTER — Encounter: Payer: Self-pay | Admitting: Family Medicine

## 2016-07-11 ENCOUNTER — Other Ambulatory Visit (INDEPENDENT_AMBULATORY_CARE_PROVIDER_SITE_OTHER): Payer: Self-pay | Admitting: Family

## 2016-07-11 ENCOUNTER — Encounter (INDEPENDENT_AMBULATORY_CARE_PROVIDER_SITE_OTHER): Payer: Self-pay | Admitting: Orthopedic Surgery

## 2016-07-11 ENCOUNTER — Ambulatory Visit (INDEPENDENT_AMBULATORY_CARE_PROVIDER_SITE_OTHER): Payer: Medicare Other | Admitting: Orthopedic Surgery

## 2016-07-11 VITALS — BP 118/70 | HR 86 | Temp 98.1°F | Ht 65.0 in | Wt 229.1 lb

## 2016-07-11 VITALS — Ht 65.0 in | Wt 229.0 lb

## 2016-07-11 DIAGNOSIS — L97511 Non-pressure chronic ulcer of other part of right foot limited to breakdown of skin: Secondary | ICD-10-CM | POA: Diagnosis not present

## 2016-07-11 DIAGNOSIS — E1151 Type 2 diabetes mellitus with diabetic peripheral angiopathy without gangrene: Secondary | ICD-10-CM

## 2016-07-11 DIAGNOSIS — I10 Essential (primary) hypertension: Secondary | ICD-10-CM | POA: Diagnosis not present

## 2016-07-11 DIAGNOSIS — M86172 Other acute osteomyelitis, left ankle and foot: Secondary | ICD-10-CM

## 2016-07-11 DIAGNOSIS — E785 Hyperlipidemia, unspecified: Secondary | ICD-10-CM

## 2016-07-11 DIAGNOSIS — E1165 Type 2 diabetes mellitus with hyperglycemia: Secondary | ICD-10-CM | POA: Diagnosis not present

## 2016-07-11 DIAGNOSIS — R600 Localized edema: Secondary | ICD-10-CM

## 2016-07-11 DIAGNOSIS — IMO0002 Reserved for concepts with insufficient information to code with codable children: Secondary | ICD-10-CM

## 2016-07-11 LAB — BASIC METABOLIC PANEL
BUN: 16 mg/dL (ref 6–23)
CO2: 31 mEq/L (ref 19–32)
Calcium: 9.9 mg/dL (ref 8.4–10.5)
Chloride: 99 mEq/L (ref 96–112)
Creatinine, Ser: 0.8 mg/dL (ref 0.40–1.20)
GFR: 75.64 mL/min (ref >60.00)
Glucose, Bld: 190 mg/dL — ABNORMAL HIGH (ref 70–99)
Potassium: 4.2 mEq/L (ref 3.5–5.1)
Sodium: 139 mEq/L (ref 135–145)

## 2016-07-11 LAB — CBC
HCT: 34.4 % — ABNORMAL LOW (ref 36.0–46.0)
Hemoglobin: 11.1 g/dL — ABNORMAL LOW (ref 12.0–15.0)
MCHC: 32.2 g/dL (ref 30.0–36.0)
MCV: 75.2 fl — ABNORMAL LOW (ref 78.0–100.0)
Platelets: 503 10*3/uL — ABNORMAL HIGH (ref 150.0–400.0)
RBC: 4.58 Mil/uL (ref 3.87–5.11)
RDW: 16.3 % — ABNORMAL HIGH (ref 11.5–15.5)
WBC: 11.1 10*3/uL — ABNORMAL HIGH (ref 4.0–10.5)

## 2016-07-11 NOTE — Progress Notes (Signed)
Office Visit Note   [name]           Date of Birth: 09-Feb-1945           MRN: [id number] Visit Date: 07/11/2016              Requested by: Lucretia Kern, DO [address] [place], [place] [address] PCP: Lucretia Kern, DO  Chief Complaint  Patient presents with  . Left Foot - Wound Check      HPI: Patient presents with acute redness swelling and drainage from the left foot second toe. Patient is currently wearing sandals.  Assessment & Plan: Visit Diagnoses:  1. Acute osteomyelitis of toe of left foot ([place])     Plan: We will plan for second ray amputation left foot. Plan for outpatient surgery she will use a Darco shoe postoperatively. She will minimize her activities.  Follow-Up Instructions: Return in about 1 week (around 07/18/2016).   Ortho Exam  Patient is alert, oriented, no adenopathy, well-dressed, normal affect, normal respiratory effort. Examination she does have an antalgic gait. Examination of left foot she has a good dorsalis pedis pulse she has cellulitis of the second toe which is does not extend proximal to the MTP joint. She has fixed clawing of the second toe with a long second metatarsal head. After informed consent a 10 blade knife was used to debride the skin and soft tissue from the tip of the second toe after debridement the ulcer is 10 mm in diameter and 10 mm deep this probes all the way to bone. Silver nitrate was used for hemostasis.  Imaging: No results found.  Labs: Lab Results  Component Value Date   HGBA1C 7.6 06/20/2016   HGBA1C 7.7 03/25/2016   HGBA1C 9.0 10/26/2015   LABURIC 5.5 09/01/2015   LABORGA GROUP B STREP (S.AGALACTIAE) ISOLATED 12/26/2014    Orders:  No orders of the defined types were placed in this encounter.  No orders of the defined types were placed in this encounter.    Procedures: No procedures performed  Clinical Data: No additional findings.  ROS:  All other systems negative, except  as noted in the HPI. Review of Systems  Objective: Vital Signs: Ht 5\' 5"  (1.651 m)   Wt 229 lb (103.9 kg)   BMI 38.11 kg/m   Specialty Comments:  No specialty comments available.  PMFS History: Patient Active Problem List   Diagnosis Date Noted  . Baker's cyst 07/10/2016  . Tachycardia 03/04/2016  . Onychomycosis 12/07/2015  . Non-pressure chronic ulcer of other part of right foot limited to breakdown of skin ([place]) 12/07/2015  . Upper airway cough syndrome 03/05/2014  . S/P amputation of lesser toe - followed by Dr. Sharol Given 04/15/2013  . Pain in joint, shoulder region, s/p RTC surgery - followed by Dr. Sharol Given 04/15/2013  . ANEMIA, MILD 02/04/2010  . Essential hypertension 12/05/2006  . Uncontrolled type 2 diabetes mellitus with peripheral circulatory disorder ([place]) 10/12/2006  . Hyperlipidemia 10/12/2006  . Allergic rhinitis 10/12/2006  . GERD - Followed by Dr. Fuller Plan 10/12/2006  . IRRITABLE BOWEL SYNDROME - followed by Dr. Fuller Plan 10/12/2006  . Peripheral neuropathy 10/11/2006  . ALOPECIA NEC 10/11/2006   Past Medical History:  Diagnosis Date  . Alopecia   . Anemia, mild   . Chronic cough    sees pulmonologist  . Chronic pain    chest wall and abd - s/p extensive eval  . Diabetes mellitus with neuropathy ([place])  sees endocrine  . Diverticulosis   . GERD (gastroesophageal reflux disease)    takes Nexium bid, hx erosive esophagitis  . Headache(784.0)    occasionally;r/t sinus   . History of colon polyps   . HTN (hypertension)   . Hx of amputation of lesser toe ([place])    sees podiatrist  . Hyperlipemia   . IBS (irritable bowel syndrome)   . Insomnia    takes Elavil nightly  . Joint pain   . Osteomyelitis ([place])   . Seasonal allergies    takes Zyrtec daily    Family History  Problem Relation Age of Onset  . Lung cancer Mother 57       smoked heavily  . Emphysema Mother   . Hypertension Father   . Hyperlipidemia Father   . Diabetes Father   . Coronary artery  disease Father   . Dementia Father   . COPD Father        smoked  . Stomach cancer Paternal Aunt   . Brain cancer Paternal Uncle   . Irritable bowel syndrome Unknown        Several family members on fathers side   . Diabetes Other   . Stomach cancer Maternal Aunt   . Colon cancer Neg Hx     Past Surgical History:  Procedure Laterality Date  . AMPUTATION  12/28/2011   Procedure: AMPUTATION DIGIT;  Surgeon: Newt Minion, MD;  Location: [facility];  Service: Orthopedics;  Laterality: Right;  Right Foot 2nd Toe Amputation at MTP (metatarsophalangeal joint)  . AMPUTATION Right 04/25/2012   Procedure: Right Foot 3rd Toe Amputation;  Surgeon: Newt Minion, MD;  Location: [facility];  Service: Orthopedics;  Laterality: Right;  Right Foot Third Toe Amputation   . AMPUTATION Right 07/27/2012   Procedure: Right 4th Toe Amputation at Metatarsophalangeal;  Surgeon: Newt Minion, MD;  Location: [facility];  Service: Orthopedics;  Laterality: Right;  Right 4th Toe Amputation at Metatarsophalangeal  . COLONOSCOPY    . LAPAROSCOPIC APPENDECTOMY  01/05/2011   Procedure: APPENDECTOMY LAPAROSCOPIC;  Surgeon: Pedro Earls, MD;  [facility];  Service: General;  Laterality: N/A;  . OOPHORECTOMY  2001  . ROTATOR CUFF REPAIR Right   . TUBAL LIGATION    . VAGINAL HYSTERECTOMY  2001   Social History   Occupational History  . Retired     Social History Main Topics  . Smoking status: Never Smoker  . Smokeless tobacco: Never Used  . Alcohol use No  . Drug use: No  . Sexual activity: Yes    Birth control/ protection: Surgical

## 2016-07-11 NOTE — Patient Instructions (Signed)
BEFORE YOU LEAVE: -labs -follow up: 3 months  See your foot specialist today.  Elevated legs. Ask your foot doctor about compression and lasix to help with swelling.   We recommend the following healthy lifestyle for LIFE: 1) Regular meals - Small portions.   Tip: eat off of a salad plate instead of a dinner plate.  2) Eat a healthy clean diet.   TRY TO EAT: -at least 5-7 servings of low sugar vegetables per day (not corn, potatoes or bananas.) -berries are the best choice if you wish to eat fruit.   -lean meets (fish, chicken or [place] breasts) -vegan proteins for some meals - beans or tofu, whole grains, nuts and seeds -Replace bad fats with good fats - good fats include: fish, nuts and seeds, canola oil, olive oil -small amounts of low fat or non fat dairy -small amounts of100 % whole grains - check the lables  AVOID: -SUGAR, sweets, anything with added sugar, corn syrup or sweeteners -if you must have a sweetener, small amounts of stevia may be best -sweetened beverages -simple starches (rice, bread, potatoes, pasta, chips, etc - small amounts of 100% whole grains are ok) -red meat, pork, butter -fried foods, fast food, processed food, excessive dairy, eggs and coconut.  3)Get at least 150 minutes of sweaty aerobic exercise per week.  4)Reduce stress - consider counseling, meditation and relaxation to balance other aspects of your life.

## 2016-07-14 ENCOUNTER — Encounter (HOSPITAL_COMMUNITY): Payer: Self-pay | Admitting: *Deleted

## 2016-07-14 ENCOUNTER — Telehealth (INDEPENDENT_AMBULATORY_CARE_PROVIDER_SITE_OTHER): Payer: Self-pay | Admitting: Orthopedic Surgery

## 2016-07-14 NOTE — Progress Notes (Signed)
Spoke with pt for pre-op call. Pt has hx of sinus tachycardia and is on Metoprolol for it. Pt is a type 2 diabetic. Last A1C was 7.6 on 06/20/16. Pt states her fasting blood sugar is usually between 126-205. Instructed pt to take 1/2 of her evening dose of Novolin N tonight and 1/2 of her morning dose in the AM ( will take 15 units each time). Instructed pt to check her blood sugar in the AM when she gets up and every 2 hours until she leaves for the hospital. If blood sugar is >220 take 1/2 of usual correction dose of Novolin R insulin. If blood sugar is 70 or below, treat with 1/2 cup of clear juice (apple or cranberry) and recheck blood sugar 15 minutes after drinking juice. If blood sugar continues to be 70 or below, call the Short Stay department and ask to speak to a nurse. Pt voiced understanding. Pt states she has been exposed to the mouth/foot disease by her grand-daughter. She is going to call Dr. Jess Barters office to notify them. She denies any symptoms.

## 2016-07-14 NOTE — Telephone Encounter (Signed)
Patient called advised she is having surgery tomorrow and was exposed to grand daughters hoof and mouth disease and want to know if she can still have surgery? The number to contact patient is [phone]

## 2016-07-15 ENCOUNTER — Ambulatory Visit (HOSPITAL_COMMUNITY): Payer: Medicare Other | Admitting: Certified Registered"

## 2016-07-15 ENCOUNTER — Ambulatory Visit (HOSPITAL_COMMUNITY)
Admission: RE | Admit: 2016-07-15 | Discharge: 2016-07-15 | Disposition: A | Discharge status: Home / Self Care | Payer: Medicare Other | Source: Ambulatory Visit | Attending: Orthopedic Surgery | Admitting: Orthopedic Surgery

## 2016-07-15 ENCOUNTER — Encounter (HOSPITAL_COMMUNITY)
Admission: RE | Disposition: A | Discharge status: Home / Self Care | Payer: Self-pay | Source: Ambulatory Visit | Attending: Orthopedic Surgery

## 2016-07-15 ENCOUNTER — Encounter (HOSPITAL_COMMUNITY): Payer: Self-pay

## 2016-07-15 DIAGNOSIS — Z794 Long term (current) use of insulin: Secondary | ICD-10-CM | POA: Diagnosis not present

## 2016-07-15 DIAGNOSIS — E11621 Type 2 diabetes mellitus with foot ulcer: Secondary | ICD-10-CM | POA: Insufficient documentation

## 2016-07-15 DIAGNOSIS — G4700 Insomnia, unspecified: Secondary | ICD-10-CM | POA: Insufficient documentation

## 2016-07-15 DIAGNOSIS — E785 Hyperlipidemia, unspecified: Secondary | ICD-10-CM | POA: Insufficient documentation

## 2016-07-15 DIAGNOSIS — G8929 Other chronic pain: Secondary | ICD-10-CM | POA: Diagnosis not present

## 2016-07-15 DIAGNOSIS — M869 Osteomyelitis, unspecified: Secondary | ICD-10-CM

## 2016-07-15 DIAGNOSIS — I739 Peripheral vascular disease, unspecified: Secondary | ICD-10-CM | POA: Insufficient documentation

## 2016-07-15 DIAGNOSIS — Z6838 Body mass index (BMI) 38.0-38.9, adult: Secondary | ICD-10-CM | POA: Insufficient documentation

## 2016-07-15 DIAGNOSIS — R51 Headache: Secondary | ICD-10-CM | POA: Insufficient documentation

## 2016-07-15 DIAGNOSIS — L659 Nonscarring hair loss, unspecified: Secondary | ICD-10-CM | POA: Insufficient documentation

## 2016-07-15 DIAGNOSIS — J309 Allergic rhinitis, unspecified: Secondary | ICD-10-CM | POA: Diagnosis not present

## 2016-07-15 DIAGNOSIS — E1169 Type 2 diabetes mellitus with other specified complication: Secondary | ICD-10-CM | POA: Diagnosis not present

## 2016-07-15 DIAGNOSIS — D649 Anemia, unspecified: Secondary | ICD-10-CM | POA: Diagnosis not present

## 2016-07-15 DIAGNOSIS — K219 Gastro-esophageal reflux disease without esophagitis: Secondary | ICD-10-CM | POA: Insufficient documentation

## 2016-07-15 DIAGNOSIS — Z86010 Personal history of colonic polyps: Secondary | ICD-10-CM | POA: Insufficient documentation

## 2016-07-15 DIAGNOSIS — E1140 Type 2 diabetes mellitus with diabetic neuropathy, unspecified: Secondary | ICD-10-CM | POA: Insufficient documentation

## 2016-07-15 DIAGNOSIS — K589 Irritable bowel syndrome without diarrhea: Secondary | ICD-10-CM | POA: Diagnosis not present

## 2016-07-15 DIAGNOSIS — M1990 Unspecified osteoarthritis, unspecified site: Secondary | ICD-10-CM | POA: Insufficient documentation

## 2016-07-15 DIAGNOSIS — E669 Obesity, unspecified: Secondary | ICD-10-CM | POA: Diagnosis not present

## 2016-07-15 DIAGNOSIS — L97529 Non-pressure chronic ulcer of other part of left foot with unspecified severity: Secondary | ICD-10-CM | POA: Diagnosis not present

## 2016-07-15 DIAGNOSIS — Z89421 Acquired absence of other right toe(s): Secondary | ICD-10-CM | POA: Insufficient documentation

## 2016-07-15 DIAGNOSIS — I10 Essential (primary) hypertension: Secondary | ICD-10-CM | POA: Diagnosis not present

## 2016-07-15 HISTORY — PX: AMPUTATION: SHX166

## 2016-07-15 HISTORY — DX: Fatty (change of) liver, not elsewhere classified: K76.0

## 2016-07-15 HISTORY — DX: Polyneuropathy, unspecified: G62.9

## 2016-07-15 HISTORY — DX: Unspecified osteoarthritis, unspecified site: M19.90

## 2016-07-15 HISTORY — DX: Tachycardia, unspecified: R00.0

## 2016-07-15 LAB — GLUCOSE, CAPILLARY
Glucose-Capillary: 148 mg/dL — ABNORMAL HIGH (ref 65–99)
Glucose-Capillary: 109 mg/dL — ABNORMAL HIGH (ref 65–99)
Glucose-Capillary: 125 mg/dL — ABNORMAL HIGH (ref 65–99)

## 2016-07-15 LAB — CBC
HCT: 37.4 % (ref 36.0–46.0)
Hemoglobin: 11 g/dL — ABNORMAL LOW (ref 12.0–15.0)
MCH: 23.3 pg — ABNORMAL LOW (ref 26.0–34.0)
MCHC: 29.4 g/dL — ABNORMAL LOW (ref 30.0–36.0)
MCV: 79.2 fL (ref 78.0–100.0)
Platelets: 498 10*3/uL — ABNORMAL HIGH (ref 150–400)
RBC: 4.72 MIL/uL (ref 3.87–5.11)
RDW: 15.4 % (ref 11.5–15.5)
WBC: 11.6 10*3/uL — ABNORMAL HIGH (ref 4.0–10.5)

## 2016-07-15 SURGERY — AMPUTATION, FOOT, RAY
Anesthesia: General | Site: Toe | Laterality: Left

## 2016-07-15 MED ORDER — FENTANYL CITRATE (PF) 100 MCG/2ML IJ SOLN
INTRAMUSCULAR | Status: DC | PRN
  Administered 2016-07-15 (×2): 50 ug via INTRAVENOUS

## 2016-07-15 MED ORDER — DEXAMETHASONE SODIUM PHOSPHATE 10 MG/ML IJ SOLN
INTRAMUSCULAR | Status: AC
  Filled 2016-07-15: qty 1

## 2016-07-15 MED ORDER — SCOPOLAMINE 1 MG/3DAYS TD PT72
1.0000 | MEDICATED_PATCH | Freq: Once | TRANSDERMAL | Status: DC
  Administered 2016-07-15: 1.5 mg via TRANSDERMAL

## 2016-07-15 MED ORDER — MEPERIDINE HCL 25 MG/ML IJ SOLN: 6.2500 mg | INTRAMUSCULAR | Status: DC | PRN

## 2016-07-15 MED ORDER — PROPOFOL 10 MG/ML IV BOLUS
INTRAVENOUS | Status: DC | PRN
  Administered 2016-07-15: 50 mg via INTRAVENOUS
  Administered 2016-07-15: 150 mg via INTRAVENOUS

## 2016-07-15 MED ORDER — LACTATED RINGERS IV SOLN
INTRAVENOUS | Status: DC | PRN
  Administered 2016-07-15: 13:00:00 via INTRAVENOUS

## 2016-07-15 MED ORDER — CHLORHEXIDINE GLUCONATE 4 % EX LIQD: 60.0000 mL | Freq: Once | CUTANEOUS | Status: DC

## 2016-07-15 MED ORDER — DEXAMETHASONE SODIUM PHOSPHATE 4 MG/ML IJ SOLN
INTRAMUSCULAR | Status: DC | PRN
  Administered 2016-07-15: 4 mg via INTRAVENOUS

## 2016-07-15 MED ORDER — PHENYLEPHRINE 40 MCG/ML (10ML) SYRINGE FOR IV PUSH (FOR BLOOD PRESSURE SUPPORT)
PREFILLED_SYRINGE | INTRAVENOUS | Status: DC | PRN
  Administered 2016-07-15 (×2): 80 ug via INTRAVENOUS

## 2016-07-15 MED ORDER — FENTANYL CITRATE (PF) 250 MCG/5ML IJ SOLN
INTRAMUSCULAR | Status: AC
  Filled 2016-07-15: qty 5

## 2016-07-15 MED ORDER — FENTANYL CITRATE (PF) 100 MCG/2ML IJ SOLN: 25.0000 ug | INTRAMUSCULAR | Status: DC | PRN

## 2016-07-15 MED ORDER — LIDOCAINE HCL (CARDIAC) 20 MG/ML IV SOLN
INTRAVENOUS | Status: DC | PRN
  Administered 2016-07-15: 50 mg via INTRAVENOUS

## 2016-07-15 MED ORDER — 0.9 % SODIUM CHLORIDE (POUR BTL) OPTIME
TOPICAL | Status: DC | PRN
  Administered 2016-07-15: 1000 mL

## 2016-07-15 MED ORDER — SCOPOLAMINE 1 MG/3DAYS TD PT72
MEDICATED_PATCH | TRANSDERMAL | Status: AC
  Filled 2016-07-15: qty 1

## 2016-07-15 MED ORDER — CEFAZOLIN SODIUM-DEXTROSE 2-4 GM/100ML-% IV SOLN
2.0000 g | INTRAVENOUS | Status: AC
  Administered 2016-07-15: 2 g via INTRAVENOUS
  Filled 2016-07-15: qty 100

## 2016-07-15 MED ORDER — MIDAZOLAM HCL 5 MG/5ML IJ SOLN
INTRAMUSCULAR | Status: DC | PRN
  Administered 2016-07-15: 2 mg via INTRAVENOUS

## 2016-07-15 MED ORDER — MIDAZOLAM HCL 2 MG/2ML IJ SOLN
INTRAMUSCULAR | Status: AC
  Filled 2016-07-15: qty 2

## 2016-07-15 MED ORDER — FENTANYL CITRATE (PF) 100 MCG/2ML IJ SOLN
INTRAMUSCULAR | Status: AC
  Filled 2016-07-15: qty 2

## 2016-07-15 MED ORDER — METOCLOPRAMIDE HCL 5 MG/ML IJ SOLN: 10.0000 mg | Freq: Once | INTRAMUSCULAR | Status: DC | PRN

## 2016-07-15 MED ORDER — LACTATED RINGERS IV SOLN
INTRAVENOUS | Status: DC
  Administered 2016-07-15: 13:00:00 via INTRAVENOUS

## 2016-07-15 MED ORDER — ONDANSETRON HCL 4 MG/2ML IJ SOLN
INTRAMUSCULAR | Status: DC | PRN
  Administered 2016-07-15: 4 mg via INTRAVENOUS

## 2016-07-15 MED ORDER — HYDROCODONE-ACETAMINOPHEN 5-325 MG PO TABS: 1.0000 | ORAL_TABLET | Freq: Four times a day (QID) | ORAL | 0 refills | Status: DC | PRN

## 2016-07-15 SURGICAL SUPPLY — 27 items
BLADE SAW SGTL MED 73X18.5 STR (BLADE) IMPLANT
BLADE SURG 21 STRL SS (BLADE) ×2 IMPLANT
BNDG COHESIVE 4X5 TAN STRL (GAUZE/BANDAGES/DRESSINGS) ×2 IMPLANT
BNDG GAUZE ELAST 4 BULKY (GAUZE/BANDAGES/DRESSINGS) ×2 IMPLANT
COVER SURGICAL LIGHT HANDLE (MISCELLANEOUS) ×4 IMPLANT
DRAPE U-SHAPE 47X51 STRL (DRAPES) ×4 IMPLANT
DRSG ADAPTIC 3X8 NADH LF (GAUZE/BANDAGES/DRESSINGS) ×2 IMPLANT
DRSG PAD ABDOMINAL 8X10 ST (GAUZE/BANDAGES/DRESSINGS) ×4 IMPLANT
DURAPREP 26ML APPLICATOR (WOUND CARE) ×2 IMPLANT
ELECT REM PT RETURN 9FT ADLT (ELECTROSURGICAL) ×2
ELECTRODE REM PT RTRN 9FT ADLT (ELECTROSURGICAL) ×1 IMPLANT
GAUZE SPONGE 4X4 12PLY STRL (GAUZE/BANDAGES/DRESSINGS) ×2 IMPLANT
GLOVE BIOGEL PI IND STRL 9 (GLOVE) ×1 IMPLANT
GLOVE BIOGEL PI INDICATOR 9 (GLOVE) ×1
GLOVE SURG ORTHO 9.0 STRL STRW (GLOVE) ×2 IMPLANT
GOWN STRL REUS W/ TWL XL LVL3 (GOWN DISPOSABLE) ×2 IMPLANT
GOWN STRL REUS W/TWL XL LVL3 (GOWN DISPOSABLE) ×2
KIT BASIN OR (CUSTOM PROCEDURE TRAY) ×2 IMPLANT
KIT ROOM TURNOVER OR (KITS) ×2 IMPLANT
NS IRRIG 1000ML POUR BTL (IV SOLUTION) ×2 IMPLANT
PACK ORTHO EXTREMITY (CUSTOM PROCEDURE TRAY) ×2 IMPLANT
PAD ARMBOARD 7.5X6 YLW CONV (MISCELLANEOUS) ×4 IMPLANT
STOCKINETTE IMPERVIOUS LG (DRAPES) IMPLANT
SUT ETHILON 2 0 PSLX (SUTURE) ×2 IMPLANT
TOWEL OR 17X26 10 PK STRL BLUE (TOWEL DISPOSABLE) ×2 IMPLANT
TUBE CONNECTING 12X1/4 (SUCTIONS) ×2 IMPLANT
YANKAUER SUCT BULB TIP NO VENT (SUCTIONS) ×2 IMPLANT

## 2016-07-15 NOTE — Telephone Encounter (Signed)
Per Dr. Sharol Given ok for her to have surgery today. Called pt to advise.

## 2016-07-15 NOTE — Op Note (Signed)
07/15/2016  3:38 PM  PATIENT:  Beth Holden    PRE-OPERATIVE DIAGNOSIS:  Osteomyelitis left 2nd toe  POST-OPERATIVE DIAGNOSIS:  Same  PROCEDURE:  Left 2nd Ray Amputation  SURGEON:  Newt Minion, MD  PHYSICIAN ASSISTANT:None ANESTHESIA:   General  PREOPERATIVE INDICATIONS:  Beth Holden is a  71 y.o. female with a diagnosis of Osteomyelitis left 2nd toe who failed conservative measures and elected for surgical management.    The risks benefits and alternatives were discussed with the patient preoperatively including but not limited to the risks of infection, bleeding, nerve injury, cardiopulmonary complications, the need for revision surgery, among others, and the patient was willing to proceed.  OPERATIVE IMPLANTS: None  OPERATIVE FINDINGS: No abscess good petechial bleeding  OPERATIVE PROCEDURE: Patient brought the operating room and underwent a general anesthetic. After adequate levels anesthesia were obtained patient's left lower extremity was prepped using DuraPrep draped into a sterile field a timeout was called. A V incision was made or around the second toe. The second metatarsal was resected through the midshaft and the infected toe and metatarsal were resected in 1 block of tissue. Electrocautery was used for hemostasis the wound was irrigated with normal saline was no evidence of an abscess at the level of resection. The skin was closed using 2-0 nylon. A sterile compressive dressing was applied patient was extubated taken to PACU in stable condition.

## 2016-07-15 NOTE — Anesthesia Postprocedure Evaluation (Signed)
Anesthesia Post Note  [name]  Procedure(s) Performed: Procedure(s) (LRB): Left 2nd Ray Amputation (Left)     Patient location during evaluation: PACU Anesthesia Type: General Level of consciousness: awake and alert and oriented Pain management: pain level controlled Vital Signs Assessment: post-procedure vital signs reviewed and stable Respiratory status: spontaneous breathing, nonlabored ventilation and respiratory function stable Cardiovascular status: blood pressure returned to baseline and stable Postop Assessment: no signs of nausea or vomiting Anesthetic complications: no    Last Vitals:  Vitals:   07/15/16 1544 07/15/16 1559  BP: 129/64 127/67  Pulse: 82 75  Resp: 18 14  Temp: 36.4 C     Last Pain:  Vitals:   07/15/16 1234  TempSrc: Oral                 Joachim Carton A.

## 2016-07-15 NOTE — Anesthesia Procedure Notes (Signed)
Procedure Name: LMA Insertion Date/Time: 07/15/2016 3:15 PM Performed by: Orlie Dakin Pre-anesthesia Checklist: Patient identified, Emergency Drugs available, Suction available, Patient being monitored and Timeout performed Patient Re-evaluated:Patient Re-evaluated prior to inductionOxygen Delivery Method: Circle system utilized Preoxygenation: Pre-oxygenation with 100% oxygen Intubation Type: IV induction Ventilation: Mask ventilation without difficulty LMA: LMA inserted LMA Size: 4.0 Number of attempts: 1 Placement Confirmation: positive ETCO2 and breath sounds checked- equal and bilateral Tube secured with: Tape Dental Injury: Teeth and Oropharynx as per pre-operative assessment

## 2016-07-15 NOTE — H&P (Signed)
Beth Holden is an 71 y.o. female.   Chief Complaint: Ulceration osteomyelitis left foot second toe HPI: Patient is a 71 year old woman with diabetic insensate neuropathy with osteomyelitis left foot second toe. Patient has failed conservative wound care and conservative pressure unloading.  Past Medical History:  Diagnosis Date  . Alopecia   . Anemia, mild   . Arthritis   . Chronic cough    sees pulmonologist  . Chronic pain    chest wall and abd - s/p extensive eval  . Diabetes mellitus with neuropathy ([place])    sees endocrine  . Diverticulosis   . Fatty liver   . GERD (gastroesophageal reflux disease)    takes Nexium bid, hx erosive esophagitis  . Headache(784.0)    occasionally;r/t sinus   . History of colon polyps   . HTN (hypertension)   . Hx of amputation of lesser toe ([name])    sees podiatrist  . Hyperlipemia   . IBS (irritable bowel syndrome)   . Insomnia    takes Elavil nightly  . Joint pain   . Neuropathy   . Osteomyelitis ([place])   . PONV (postoperative nausea and vomiting)   . Seasonal allergies    takes Zyrtec daily  . Sinus tachycardia     Past Surgical History:  Procedure Laterality Date  . AMPUTATION  12/28/2011   Procedure: AMPUTATION DIGIT;  Surgeon: Newt Minion, MD;  Location: [facility];  Service: Orthopedics;  Laterality: Right;  Right Foot 2nd Toe Amputation at MTP (metatarsophalangeal joint)  . AMPUTATION Right 04/25/2012   Procedure: Right Foot 3rd Toe Amputation;  Surgeon: Newt Minion, MD;  Location: [facility];  Service: Orthopedics;  Laterality: Right;  Right Foot Third Toe Amputation   . AMPUTATION Right 07/27/2012   Procedure: Right 4th Toe Amputation at Metatarsophalangeal;  Surgeon: Newt Minion, MD;  Location: [facility];  Service: Orthopedics;  Laterality: Right;  Right 4th Toe Amputation at Metatarsophalangeal  . COLONOSCOPY    . LAPAROSCOPIC APPENDECTOMY  01/05/2011   Procedure: APPENDECTOMY LAPAROSCOPIC;  Surgeon: Pedro Earls, MD;  Location:  [facility];  Service: General;  Laterality: N/A;  . OOPHORECTOMY  2001  . ROTATOR CUFF REPAIR Right   . TUBAL LIGATION    . VAGINAL HYSTERECTOMY  2001    Family History  Problem Relation Age of Onset  . Lung cancer Mother 50       smoked heavily  . Emphysema Mother   . Hypertension Father   . Hyperlipidemia Father   . Diabetes Father   . Coronary artery disease Father   . Dementia Father   . COPD Father        smoked  . Stomach cancer Paternal Aunt   . Brain cancer Paternal Uncle   . Irritable bowel syndrome Unknown        Several family members on fathers side   . Diabetes Other   . Stomach cancer Maternal Aunt   . Colon cancer Neg Hx    Social History:  reports that she has never smoked. She has never used smokeless tobacco. She reports that she does not drink alcohol or use drugs.  Allergies:  Allergies  Allergen Reactions  . Codeine Other (See Comments)    Makes her crazy  . Propoxyphene Hcl Itching    No prescriptions prior to admission.    No results found for this or any previous visit (from the past 48 hour(s)). No results found.  Review of Systems  All other  systems reviewed and are negative.   There were no vitals taken for this visit. Physical Exam  On examination patient is alert oriented no adenopathy well-dressed normal affect normal respiratory effort she does have an antalgic gait. She is a good dorsalis pedis pulse. Examination patient has cellulitis of the second toe up to the MTP joint. There is sausage digit swelling the ulcer probes to bone. Radiograph shows destructive bony changes. Assessment/Plan Assessment: Diabetic insensate neuropathy with osteomyelitis ulceration left foot second toe.  Plan: We'll plan for left foot second Ray amputation. Risks and benefits were discussed including persistent infection nonhealing of the wound need for additional surgery. Patient states she understands wish to proceed at this time.  Newt Minion,  MD 07/15/2016, 6:42 AM

## 2016-07-15 NOTE — Anesthesia Preprocedure Evaluation (Signed)
Anesthesia Evaluation  Patient identified by MRN, date of birth, ID band Patient awake    Reviewed: Allergy & Precautions, NPO status , Patient's Chart, lab work & pertinent test results, reviewed documented beta blocker date and time   History of Anesthesia Complications (+) PONV and history of anesthetic complications  Airway Mallampati: I  TM Distance: >3 FB Neck ROM: Full    Dental no notable dental hx. (+) Caps, Teeth Intact   Pulmonary neg pulmonary ROS,    Pulmonary exam normal breath sounds clear to auscultation       Cardiovascular hypertension, Pt. on medications and Pt. on home beta blockers + Peripheral Vascular Disease  Normal cardiovascular exam Rhythm:Regular Rate:Normal     Neuro/Psych  Headaches, Diabetic peripheral neuropathy  Neuromuscular disease negative psych ROS   GI/Hepatic Neg liver ROS, GERD  Medicated and Controlled,  Endo/Other  diabetes, Poorly Controlled, Type 2, Insulin Dependent, Oral Hypoglycemic AgentsMorbid obesity  Renal/GU negative Renal ROS  negative genitourinary   Musculoskeletal  (+) Arthritis , Osteoarthritis,  Osteomyelitis Left 2nd toe   Abdominal (+) + obese,   Peds  Hematology  (+) anemia ,   Anesthesia Other Findings   Reproductive/Obstetrics                             Anesthesia Physical Anesthesia Plan  ASA: III  Anesthesia Plan: General   Post-op Pain Management:    Induction: Intravenous  PONV Risk Score and Plan: 4 or greater and Ondansetron, Dexamethasone, Propofol, Midazolam, Scopolamine patch - Pre-op and Treatment may vary due to age or medical condition  Airway Management Planned: LMA  Additional Equipment:   Intra-op Plan:   Post-operative Plan: Extubation in OR  Informed Consent: I have reviewed the patients History and Physical, chart, labs and discussed the procedure including the risks, benefits and alternatives  for the proposed anesthesia with the patient or authorized representative who has indicated his/her understanding and acceptance.   Dental advisory given  Plan Discussed with: CRNA, Anesthesiologist and Surgeon  Anesthesia Plan Comments:         Anesthesia Quick Evaluation

## 2016-07-15 NOTE — Progress Notes (Signed)
Orthopedic Tech Progress Note Patient Details:  [name] Jun 05, 1945 [id number]  Ortho Devices Type of Ortho Device: Postop shoe/boot Ortho Device/Splint Location: applied post op shoe to pt left foot.  pt tolerated application very well.  Left foot.  Ortho Device/Splint Interventions: Application, Adjustment   Kristopher Oppenheim 07/15/2016, 4:29 PM

## 2016-07-15 NOTE — Transfer of Care (Signed)
Immediate Anesthesia Transfer of Care Note  [name]  Procedure(s) Performed: Procedure(s): Left 2nd Ray Amputation (Left)  Patient Location: PACU  Anesthesia Type:General  Level of Consciousness: awake and patient cooperative  Airway & Oxygen Therapy: Patient Spontanous Breathing and Patient connected to face mask oxygen  Post-op Assessment: Report given to RN and Post -op Vital signs reviewed and stable  Post vital signs: Reviewed and stable  Last Vitals:  Vitals:   07/15/16 1234 07/15/16 1544  BP: (!) 157/69 129/64  Pulse: 83 82  Resp: 18 18  Temp: 37.2 C 36.4 C    Last Pain:  Vitals:   07/15/16 1234  TempSrc: Oral         Complications: No apparent anesthesia complications

## 2016-07-17 ENCOUNTER — Encounter (HOSPITAL_COMMUNITY): Payer: Self-pay | Admitting: Orthopedic Surgery

## 2016-07-21 ENCOUNTER — Encounter: Payer: Self-pay | Admitting: Family Medicine

## 2016-07-22 ENCOUNTER — Encounter (INDEPENDENT_AMBULATORY_CARE_PROVIDER_SITE_OTHER): Payer: Self-pay | Admitting: Orthopedic Surgery

## 2016-07-22 ENCOUNTER — Ambulatory Visit (INDEPENDENT_AMBULATORY_CARE_PROVIDER_SITE_OTHER): Payer: Medicare Other | Admitting: Orthopedic Surgery

## 2016-07-22 VITALS — Ht 65.0 in | Wt 229.0 lb

## 2016-07-22 DIAGNOSIS — M86172 Other acute osteomyelitis, left ankle and foot: Secondary | ICD-10-CM

## 2016-07-22 NOTE — Progress Notes (Signed)
Office Visit Note   [name]           Date of Birth: 03/21/1945           MRN: [id number] Visit Date: 07/22/2016              Requested by: Lucretia Kern, DO [address] [place],  [address] PCP: Lucretia Kern, DO  Chief Complaint  Patient presents with  . Left Foot - Routine Post Op    07/15/16 2nd ray amputation      HPI: Patient is one week status post second toe amputation left foot the incision is well approximated.  Assessment & Plan: Visit Diagnoses:  1. Acute osteomyelitis of toe of left foot (HCC)     Plan: Dial soap cleansing Darco shoe minimize weightbearing follow-up in 1 week to remove the sutures.  Follow-Up Instructions: Return in about 1 week (around 07/29/2016).   Ortho Exam  Patient is alert, oriented, no adenopathy, well-dressed, normal affect, normal respiratory effort. Examination the wound edges are well approximated there is no redness no cellulitis no drainage no signs of infection.  Imaging: No results found.  Labs: Lab Results  Component Value Date   HGBA1C 7.6 06/20/2016   HGBA1C 7.7 03/25/2016   HGBA1C 9.0 10/26/2015   LABURIC 5.5 09/01/2015   LABORGA GROUP B STREP (S.AGALACTIAE) ISOLATED 12/26/2014    Orders:  No orders of the defined types were placed in this encounter.  No orders of the defined types were placed in this encounter.    Procedures: No procedures performed  Clinical Data: No additional findings.  ROS:  All other systems negative, except as noted in the HPI. Review of Systems  Objective: Vital Signs: Ht 5\' 5"  (1.651 m)   Wt 229 lb (103.9 kg)   BMI 38.11 kg/m   Specialty Comments:  No specialty comments available.  PMFS History: Patient Active Problem List   Diagnosis Date Noted  . Osteomyelitis of toe of left foot ([place])   . Baker's cyst 07/10/2016  . Tachycardia 03/04/2016  . Onychomycosis 12/07/2015  . Non-pressure chronic ulcer of other part of right foot limited  to breakdown of skin ([place]) 12/07/2015  . Upper airway cough syndrome 03/05/2014  . S/P amputation of lesser toe - followed by Dr. Sharol Given 04/15/2013  . Pain in joint, shoulder region, s/p RTC surgery - followed by Dr. Sharol Given 04/15/2013  . ANEMIA, MILD 02/04/2010  . Essential hypertension 12/05/2006  . Uncontrolled type 2 diabetes mellitus with peripheral circulatory disorder ([place]) 10/12/2006  . Hyperlipidemia 10/12/2006  . Allergic rhinitis 10/12/2006  . GERD - Followed by Dr. Fuller Plan 10/12/2006  . IRRITABLE BOWEL SYNDROME - followed by Dr. Fuller Plan 10/12/2006  . Peripheral neuropathy 10/11/2006  . ALOPECIA NEC 10/11/2006   Past Medical History:  Diagnosis Date  . Alopecia   . Anemia, mild   . Arthritis   . Chronic cough    sees pulmonologist  . Chronic pain    chest wall and abd - s/p extensive eval  . Diabetes mellitus with neuropathy ([place])    sees endocrine  . Diverticulosis   . Fatty liver   . GERD (gastroesophageal reflux disease)    takes Nexium bid, hx erosive esophagitis  . Headache(784.0)    occasionally;r/t sinus   . History of colon polyps   . HTN (hypertension)   . Hx of amputation of lesser toe ([place])    sees podiatrist  . Hyperlipemia   . IBS (irritable  bowel syndrome)   . Insomnia    takes Elavil nightly  . Joint pain   . Neuropathy   . Osteomyelitis ([place])   . PONV (postoperative nausea and vomiting)   . Seasonal allergies    takes Zyrtec daily  . Sinus tachycardia     Family History  Problem Relation Age of Onset  . Lung cancer Mother 83       smoked heavily  . Emphysema Mother   . Hypertension Father   . Hyperlipidemia Father   . Diabetes Father   . Coronary artery disease Father   . Dementia Father   . COPD Father        smoked  . Stomach cancer Paternal Aunt   . Brain cancer Paternal Uncle   . Irritable bowel syndrome Unknown        Several family members on fathers side   . Diabetes Other   . Stomach cancer Maternal Aunt   . Colon cancer Neg  Hx     Past Surgical History:  Procedure Laterality Date  . AMPUTATION  12/28/2011   Procedure: AMPUTATION DIGIT;  Surgeon: Newt Minion, MD;  Location: [facility];  Service: Orthopedics;  Laterality: Right;  Right Foot 2nd Toe Amputation at MTP (metatarsophalangeal joint)  . AMPUTATION Right 04/25/2012   Procedure: Right Foot 3rd Toe Amputation;  Surgeon: Newt Minion, MD;  Location: [facility];  Service: Orthopedics;  Laterality: Right;  Right Foot Third Toe Amputation   . AMPUTATION Right 07/27/2012   Procedure: Right 4th Toe Amputation at Metatarsophalangeal;  Surgeon: Newt Minion, MD;  Location: [facility];  Service: Orthopedics;  Laterality: Right;  Right 4th Toe Amputation at Metatarsophalangeal  . AMPUTATION Left 07/15/2016   Procedure: Left 2nd Ray Amputation;  Surgeon: Newt Minion, MD;  Location: [facility];  Service: Orthopedics;  Laterality: Left;  . COLONOSCOPY    . LAPAROSCOPIC APPENDECTOMY  01/05/2011   Procedure: APPENDECTOMY LAPAROSCOPIC;  Surgeon: Pedro Earls, MD;  [facility];  Service: General;  Laterality: N/A;  . OOPHORECTOMY  2001  . ROTATOR CUFF REPAIR Right   . TUBAL LIGATION    . VAGINAL HYSTERECTOMY  2001   Social History   Occupational History  . Retired     Social History Main Topics  . Smoking status: Never Smoker  . Smokeless tobacco: Never Used  . Alcohol use No  . Drug use: No  . Sexual activity: Yes    Birth control/ protection: Surgical

## 2016-07-29 NOTE — Telephone Encounter (Signed)
error 

## 2016-08-01 ENCOUNTER — Encounter (INDEPENDENT_AMBULATORY_CARE_PROVIDER_SITE_OTHER): Payer: Self-pay | Admitting: Orthopedic Surgery

## 2016-08-01 ENCOUNTER — Ambulatory Visit (INDEPENDENT_AMBULATORY_CARE_PROVIDER_SITE_OTHER): Payer: Medicare Other | Admitting: Orthopedic Surgery

## 2016-08-01 VITALS — Ht 65.0 in | Wt 229.0 lb

## 2016-08-01 DIAGNOSIS — M86172 Other acute osteomyelitis, left ankle and foot: Secondary | ICD-10-CM

## 2016-08-01 DIAGNOSIS — L97511 Non-pressure chronic ulcer of other part of right foot limited to breakdown of skin: Secondary | ICD-10-CM

## 2016-08-01 NOTE — Progress Notes (Signed)
Office Visit Note   [name]           Date of Birth: 03-10-1945           MRN: [id number] Visit Date: 08/01/2016              Requested by: Lucretia Kern, DO [address] [place], [place] [address] PCP: Lucretia Kern, DO  Chief Complaint  Patient presents with  . Left Foot - Routine Post Op    07/15/16 Left 2nd ray amputation 2 weeks 3 days post op      HPI: Patient is a 71 year old woman status post second ray amputation left foot and a Wagner grade 1 ulcer right foot.  Assessment & Plan: Visit Diagnoses:  1. Acute osteomyelitis of toe of left foot ([place])   2. Non-pressure chronic ulcer of other part of right foot limited to breakdown of skin ([place])     Plan: Ulcer debridement right foot harvest sutures left foot advance to regular shoewear.  Follow-Up Instructions: Return in about 3 weeks (around 08/22/2016).   Ortho Exam  Patient is alert, oriented, no adenopathy, well-dressed, normal affect, normal respiratory effort. Examination the incision is well-healed there is no redness no cellulitis no drainage no signs of infection. Examination the right foot her Wagner grade 1 ulcer is improving significantly. A 10 blade knife was used to repair the callus there is superficial epithelization over the wound which is 5 mm x 0.1 mm deep.  Imaging: No results found.  Labs: Lab Results  Component Value Date   HGBA1C 7.6 06/20/2016   HGBA1C 7.7 03/25/2016   HGBA1C 9.0 10/26/2015   LABURIC 5.5 09/01/2015   LABORGA GROUP B STREP (S.AGALACTIAE) ISOLATED 12/26/2014    Orders:  No orders of the defined types were placed in this encounter.  No orders of the defined types were placed in this encounter.    Procedures: No procedures performed  Clinical Data: No additional findings.  ROS:  All other systems negative, except as noted in the HPI. Review of Systems  Objective: Vital Signs: Ht 5\' 5"  (1.651 m)   Wt 229 lb (103.9 kg)   BMI 38.11 kg/m     Specialty Comments:  No specialty comments available.  PMFS History: Patient Active Problem List   Diagnosis Date Noted  . Osteomyelitis of toe of left foot ([place])   . Baker's cyst 07/10/2016  . Tachycardia 03/04/2016  . Onychomycosis 12/07/2015  . Non-pressure chronic ulcer of other part of right foot limited to breakdown of skin ([place]) 12/07/2015  . Upper airway cough syndrome 03/05/2014  . S/P amputation of lesser toe - followed by Dr. Sharol Given 04/15/2013  . Pain in joint, shoulder region, s/p RTC surgery - followed by Dr. Sharol Given 04/15/2013  . ANEMIA, MILD 02/04/2010  . Essential hypertension 12/05/2006  . Uncontrolled type 2 diabetes mellitus with peripheral circulatory disorder ([place]) 10/12/2006  . Hyperlipidemia 10/12/2006  . Allergic rhinitis 10/12/2006  . GERD - Followed by Dr. Fuller Plan 10/12/2006  . IRRITABLE BOWEL SYNDROME - followed by Dr. Fuller Plan 10/12/2006  . Peripheral neuropathy 10/11/2006  . ALOPECIA NEC 10/11/2006   Past Medical History:  Diagnosis Date  . Alopecia   . Anemia, mild   . Arthritis   . Chronic cough    sees pulmonologist  . Chronic pain    chest wall and abd - s/p extensive eval  . Diabetes mellitus with neuropathy ([place])    sees endocrine  . Diverticulosis   .  Fatty liver   . GERD (gastroesophageal reflux disease)    takes Nexium bid, hx erosive esophagitis  . Headache(784.0)    occasionally;r/t sinus   . History of colon polyps   . HTN (hypertension)   . Hx of amputation of lesser toe ([place])    sees podiatrist  . Hyperlipemia   . IBS (irritable bowel syndrome)   . Insomnia    takes Elavil nightly  . Joint pain   . Neuropathy   . Osteomyelitis ([place])   . PONV (postoperative nausea and vomiting)   . Seasonal allergies    takes Zyrtec daily  . Sinus tachycardia     Family History  Problem Relation Age of Onset  . Lung cancer Mother 72       smoked heavily  . Emphysema Mother   . Hypertension Father   . Hyperlipidemia Father   . Diabetes  Father   . Coronary artery disease Father   . Dementia Father   . COPD Father        smoked  . Stomach cancer Paternal Aunt   . Brain cancer Paternal Uncle   . Irritable bowel syndrome Unknown        Several family members on fathers side   . Diabetes Other   . Stomach cancer Maternal Aunt   . Colon cancer Neg Hx     Past Surgical History:  Procedure Laterality Date  . AMPUTATION  12/28/2011   Procedure: AMPUTATION DIGIT;  Surgeon: Newt Minion, MD;  Location: [facility];  Service: Orthopedics;  Laterality: Right;  Right Foot 2nd Toe Amputation at MTP (metatarsophalangeal joint)  . AMPUTATION Right 04/25/2012   Procedure: Right Foot 3rd Toe Amputation;  Surgeon: Newt Minion, MD;  Location: [facility];  Service: Orthopedics;  Laterality: Right;  Right Foot Third Toe Amputation   . AMPUTATION Right 07/27/2012   Procedure: Right 4th Toe Amputation at Metatarsophalangeal;  Surgeon: Newt Minion, MD;  Location: [facility];  Service: Orthopedics;  Laterality: Right;  Right 4th Toe Amputation at Metatarsophalangeal  . AMPUTATION Left 07/15/2016   Procedure: Left 2nd Ray Amputation;  Surgeon: Newt Minion, MD;  Location: [facility];  Service: Orthopedics;  Laterality: Left;  . COLONOSCOPY    . LAPAROSCOPIC APPENDECTOMY  01/05/2011   Procedure: APPENDECTOMY LAPAROSCOPIC;  Surgeon: Pedro Earls, MD;  [facility];  Service: General;  Laterality: N/A;  . OOPHORECTOMY  2001  . ROTATOR CUFF REPAIR Right   . TUBAL LIGATION    . VAGINAL HYSTERECTOMY  2001   Social History   Occupational History  . Retired     Social History Main Topics  . Smoking status: Never Smoker  . Smokeless tobacco: Never Used  . Alcohol use No  . Drug use: No  . Sexual activity: Yes    Birth control/ protection: Surgical

## 2016-08-11 ENCOUNTER — Telehealth (INDEPENDENT_AMBULATORY_CARE_PROVIDER_SITE_OTHER): Payer: Self-pay

## 2016-08-11 ENCOUNTER — Encounter (INDEPENDENT_AMBULATORY_CARE_PROVIDER_SITE_OTHER): Payer: Self-pay | Admitting: Family

## 2016-08-11 ENCOUNTER — Ambulatory Visit (INDEPENDENT_AMBULATORY_CARE_PROVIDER_SITE_OTHER): Payer: Medicare Other | Admitting: Family

## 2016-08-11 VITALS — Ht 65.0 in | Wt 229.0 lb

## 2016-08-11 DIAGNOSIS — Z89422 Acquired absence of other left toe(s): Secondary | ICD-10-CM

## 2016-08-11 DIAGNOSIS — L03032 Cellulitis of left toe: Secondary | ICD-10-CM

## 2016-08-11 MED ORDER — DOXYCYCLINE HYCLATE 100 MG PO TABS: 100.0000 mg | ORAL_TABLET | Freq: Two times a day (BID) | ORAL | 0 refills | Status: DC

## 2016-08-11 MED ORDER — FLUCONAZOLE 150 MG PO TABS: 150.0000 mg | ORAL_TABLET | Freq: Once | ORAL | 0 refills | Status: AC

## 2016-08-11 NOTE — Progress Notes (Signed)
Post-Op Visit Note   [name]           Date of Birth: [birth year]           MRN: [id number] Visit Date: 08/11/2016 PCP: Lucretia Kern, DO  Chief Complaint: No chief complaint on file.   HPI:  The patient is 71 year old woman who today is about 4 weeks status post second ray amputation this is nearly healed she is here today for concern of erythema or infection scan this has been ongoing since Tuesday. She's been wearing compression stockings wonders if this causes redness.    Ortho Exam  Incision well healed proximally. Distally there is a area that is about 5 mm in diameter this is filled in with exudative tissue there is no drainage or depth. There is cellulitis to her soft tissues are incision is tenderness.  Visit Diagnoses:  1. Status post amputation of lesser toe of left foot ([place])   2. Cellulitis of left toe     Plan: Divided a prescription for doxycycline she'll follow-up in office as scheduled with Dr. Sharol Given. Discussed return  precautions.  Follow-Up Instructions: No Follow-up on file.   Imaging: No results found.  Orders:  No orders of the defined types were placed in this encounter.  Meds ordered this encounter  Medications  . doxycycline (VIBRA-TABS) 100 MG tablet    Sig: Take 1 tablet (100 mg total) by mouth 2 (two) times daily.    Dispense:  28 tablet    Refill:  0  . fluconazole (DIFLUCAN) 150 MG tablet    Sig: Take 1 tablet (150 mg total) by mouth once.    Dispense:  3 tablet    Refill:  0     PMFS History: Patient Active Problem List   Diagnosis Date Noted  . Osteomyelitis of toe of left foot ([place])   . Baker's cyst 07/10/2016  . Tachycardia 03/04/2016  . Onychomycosis 12/07/2015  . Non-pressure chronic ulcer of other part of right foot limited to breakdown of skin ([place]) 12/07/2015  . Upper airway cough syndrome 03/05/2014  . S/P amputation of lesser toe - followed by Dr. Sharol Given 04/15/2013  . Pain in joint, shoulder region, s/p  RTC surgery - followed by Dr. Sharol Given 04/15/2013  . ANEMIA, MILD 02/04/2010  . Essential hypertension 12/05/2006  . Uncontrolled type 2 diabetes mellitus with peripheral circulatory disorder ([place]) 10/12/2006  . Hyperlipidemia 10/12/2006  . Allergic rhinitis 10/12/2006  . GERD - Followed by Dr. Fuller Plan 10/12/2006  . IRRITABLE BOWEL SYNDROME - followed by Dr. Fuller Plan 10/12/2006  . Peripheral neuropathy 10/11/2006  . ALOPECIA NEC 10/11/2006   Past Medical History:  Diagnosis Date  . Alopecia   . Anemia, mild   . Arthritis   . Chronic cough    sees pulmonologist  . Chronic pain    chest wall and abd - s/p extensive eval  . Diabetes mellitus with neuropathy ([place])    sees endocrine  . Diverticulosis   . Fatty liver   . GERD (gastroesophageal reflux disease)    takes Nexium bid, hx erosive esophagitis  . Headache(784.0)    occasionally;r/t sinus   . History of colon polyps   . HTN (hypertension)   . Hx of amputation of lesser toe ([place])    sees podiatrist  . Hyperlipemia   . IBS (irritable bowel syndrome)   . Insomnia    takes Elavil nightly  . Joint pain   . Neuropathy   . Osteomyelitis ([place])   .  PONV (postoperative nausea and vomiting)   . Seasonal allergies    takes Zyrtec daily  . Sinus tachycardia     Family History  Problem Relation Age of Onset  . Lung cancer Mother 36       smoked heavily  . Emphysema Mother   . Hypertension Father   . Hyperlipidemia Father   . Diabetes Father   . Coronary artery disease Father   . Dementia Father   . COPD Father        smoked  . Stomach cancer Paternal Aunt   . Brain cancer Paternal Uncle   . Irritable bowel syndrome Unknown        Several family members on fathers side   . Diabetes Other   . Stomach cancer Maternal Aunt   . Colon cancer Neg Hx     Past Surgical History:  Procedure Laterality Date  . AMPUTATION  12/28/2011   Procedure: AMPUTATION DIGIT;  Surgeon: Newt Minion, MD;  Location: [facility];  Service: Orthopedics;   Laterality: Right;  Right Foot 2nd Toe Amputation at MTP (metatarsophalangeal joint)  . AMPUTATION Right 04/25/2012   Procedure: Right Foot 3rd Toe Amputation;  Surgeon: Newt Minion, MD;  Location: [facility];  Service: Orthopedics;  Laterality: Right;  Right Foot Third Toe Amputation   . AMPUTATION Right 07/27/2012   Procedure: Right 4th Toe Amputation at Metatarsophalangeal;  Surgeon: Newt Minion, MD;  Location: [facility];  Service: Orthopedics;  Laterality: Right;  Right 4th Toe Amputation at Metatarsophalangeal  . AMPUTATION Left 07/15/2016   Procedure: Left 2nd Ray Amputation;  Surgeon: Newt Minion, MD;  Location: [facility];  Service: Orthopedics;  Laterality: Left;  . COLONOSCOPY    . LAPAROSCOPIC APPENDECTOMY  01/05/2011   Procedure: APPENDECTOMY LAPAROSCOPIC;  Surgeon: Pedro Earls, MD;  [facility];  Service: General;  Laterality: N/A;  . OOPHORECTOMY  2001  . ROTATOR CUFF REPAIR Right   . TUBAL LIGATION    . VAGINAL HYSTERECTOMY  2001   Social History   Occupational History  . Retired     Social History Main Topics  . Smoking status: Never Smoker  . Smokeless tobacco: Never Used  . Alcohol use No  . Drug use: No  . Sexual activity: Yes    Birth control/ protection: Surgical

## 2016-08-11 NOTE — Telephone Encounter (Signed)
Patient called stating that her toe is red where the stitches were, states somewhat swollen, but denies a lot of swelling or warmth. I tried to give her an appt tomorrow with Junie Panning and she states she will be out of town. She asked that I just ask you guys if her starting the compression stockings can do this at all.

## 2016-08-11 NOTE — Telephone Encounter (Signed)
I called and spoke with the patient and advised her there is no way to tell her why her foot is red without physical exam, she needs to come in office for eval. Made appt at 1030 with Erin.

## 2016-08-26 ENCOUNTER — Other Ambulatory Visit: Payer: Self-pay | Admitting: Gastroenterology

## 2016-08-26 ENCOUNTER — Other Ambulatory Visit: Payer: Self-pay

## 2016-08-26 ENCOUNTER — Ambulatory Visit (INDEPENDENT_AMBULATORY_CARE_PROVIDER_SITE_OTHER): Payer: Medicare Other | Admitting: Orthopedic Surgery

## 2016-08-26 ENCOUNTER — Encounter: Payer: Self-pay | Admitting: Family Medicine

## 2016-08-26 ENCOUNTER — Other Ambulatory Visit: Payer: Self-pay | Admitting: Family Medicine

## 2016-08-26 ENCOUNTER — Other Ambulatory Visit: Payer: Self-pay | Admitting: Internal Medicine

## 2016-08-26 ENCOUNTER — Encounter: Payer: Self-pay | Admitting: Internal Medicine

## 2016-08-26 MED ORDER — LOVASTATIN 20 MG PO TABS: 20.0000 mg | ORAL_TABLET | Freq: Every day | ORAL | 1 refills | Status: DC

## 2016-08-26 MED ORDER — CETIRIZINE HCL 10 MG PO TABS: 10.0000 mg | ORAL_TABLET | Freq: Every day | ORAL | 1 refills | Status: DC

## 2016-08-26 MED ORDER — GLUCOSE BLOOD VI STRP: ORAL_STRIP | 1 refills | Status: DC

## 2016-08-26 NOTE — Telephone Encounter (Signed)
Rx done and the pt was notified via Mychart message. 

## 2016-08-29 ENCOUNTER — Encounter (INDEPENDENT_AMBULATORY_CARE_PROVIDER_SITE_OTHER): Payer: Self-pay | Admitting: Orthopedic Surgery

## 2016-08-29 ENCOUNTER — Ambulatory Visit (INDEPENDENT_AMBULATORY_CARE_PROVIDER_SITE_OTHER): Payer: Medicare Other | Admitting: Orthopedic Surgery

## 2016-08-29 DIAGNOSIS — Z89422 Acquired absence of other left toe(s): Secondary | ICD-10-CM

## 2016-08-29 DIAGNOSIS — L97511 Non-pressure chronic ulcer of other part of right foot limited to breakdown of skin: Secondary | ICD-10-CM

## 2016-08-29 DIAGNOSIS — B351 Tinea unguium: Secondary | ICD-10-CM

## 2016-08-29 NOTE — Progress Notes (Signed)
Office Visit Note   [name]           Date of Birth: Feb 16, 1945           MRN: [id number] Visit Date: 08/29/2016              Requested by: Lucretia Kern, DO [address] [place], [place] [address] PCP: Lucretia Kern, DO  Chief Complaint  Patient presents with  . Left Foot - Pain    2nd ray amputation, with redness of 3rd toe with swelling      HPI: Patient is a 71 year old woman diabetic insensate neuropathy who presents for evaluation for 3 separate issues #1 onychomycosis #2 Wagner grade 1 ulcer plantar aspect right foot #3 status post left foot second ray amputation. Patient has completed her antibiotics. She states she feels like something is going on she denies any fever or chills or nausea denies any tenderness.  Assessment & Plan: Visit Diagnoses:  1. Status post amputation of lesser toe of left foot ([place])   2. Non-pressure chronic ulcer of other part of right foot limited to breakdown of skin ([place])   3. Onychomycosis     Plan: Nails 5 ulcer debrided of skin and soft tissue on the right foot. Recommended that she wear her knee-high compression stockings to decrease the venous stasis swelling. Patient was given paperwork to see if she would like to enroll in the [facility] study.  Follow-Up Instructions: Return in about 4 weeks (around 09/26/2016).   Ortho Exam  Patient is alert, oriented, no adenopathy, well-dressed, normal affect, normal respiratory effort. Examination patient's left foot there is some venous stasis swelling her pictures were looked at which ALSO showed venous stasis blistering. There is no tenderness to palpation no drainage no odor she has some dependent redness which resolves with elevation. She does have pitting edema in both lower extremities she has onychomycotic nails 6. Examination of the right foot she has a Wagner grade 1 ulcer beneath the second and third metatarsal heads. After informed consent a 10 blade knife  was used to debride the skin and soft tissue back to bleeding viable granulation tissue. Silver nitrate was used for hemostasis and a Band-Aid and a pressure offloading donut was applied. Ulcer is 20 mm in diameter and 2 mm deep. Nails were trimmed 5 without complications. Patient had no paronychial infections she has thick and discolored onychomycotic nails.  Imaging: No results found. No images are attached to the encounter.  Labs: Lab Results  Component Value Date   HGBA1C 7.6 06/20/2016   HGBA1C 7.7 03/25/2016   HGBA1C 9.0 10/26/2015   LABURIC 5.5 09/01/2015   LABORGA GROUP B STREP (S.AGALACTIAE) ISOLATED 12/26/2014    Orders:  No orders of the defined types were placed in this encounter.  No orders of the defined types were placed in this encounter.    Procedures: No procedures performed  Clinical Data: No additional findings.  ROS:  All other systems negative, except as noted in the HPI. Review of Systems  Objective: Vital Signs: There were no vitals taken for this visit.  Specialty Comments:  No specialty comments available.  PMFS History: Patient Active Problem List   Diagnosis Date Noted  . Osteomyelitis of toe of left foot ([place])   . Baker's cyst 07/10/2016  . Tachycardia 03/04/2016  . Onychomycosis 12/07/2015  . Non-pressure chronic ulcer of other part of right foot limited to breakdown of skin ([place]) 12/07/2015  . Upper airway  cough syndrome 03/05/2014  . S/P amputation of lesser toe - followed by Dr. Sharol Given 04/15/2013  . Pain in joint, shoulder region, s/p RTC surgery - followed by Dr. Sharol Given 04/15/2013  . ANEMIA, MILD 02/04/2010  . Essential hypertension 12/05/2006  . Uncontrolled type 2 diabetes mellitus with peripheral circulatory disorder ([place]) 10/12/2006  . Hyperlipidemia 10/12/2006  . Allergic rhinitis 10/12/2006  . GERD - Followed by Dr. Fuller Plan 10/12/2006  . IRRITABLE BOWEL SYNDROME - followed by Dr. Fuller Plan 10/12/2006  . Peripheral neuropathy  10/11/2006  . ALOPECIA NEC 10/11/2006   Past Medical History:  Diagnosis Date  . Alopecia   . Anemia, mild   . Arthritis   . Chronic cough    sees pulmonologist  . Chronic pain    chest wall and abd - s/p extensive eval  . Diabetes mellitus with neuropathy ([place])    sees endocrine  . Diverticulosis   . Fatty liver   . GERD (gastroesophageal reflux disease)    takes Nexium bid, hx erosive esophagitis  . Headache(784.0)    occasionally;r/t sinus   . History of colon polyps   . HTN (hypertension)   . Hx of amputation of lesser toe ([place])    sees podiatrist  . Hyperlipemia   . IBS (irritable bowel syndrome)   . Insomnia    takes Elavil nightly  . Joint pain   . Neuropathy   . Osteomyelitis ([place])   . PONV (postoperative nausea and vomiting)   . Seasonal allergies    takes Zyrtec daily  . Sinus tachycardia     Family History  Problem Relation Age of Onset  . Lung cancer Mother 42       smoked heavily  . Emphysema Mother   . Hypertension Father   . Hyperlipidemia Father   . Diabetes Father   . Coronary artery disease Father   . Dementia Father   . COPD Father        smoked  . Stomach cancer Paternal Aunt   . Brain cancer Paternal Uncle   . Irritable bowel syndrome Unknown        Several family members on fathers side   . Diabetes Other   . Stomach cancer Maternal Aunt   . Colon cancer Neg Hx     Past Surgical History:  Procedure Laterality Date  . AMPUTATION  12/28/2011   Procedure: AMPUTATION DIGIT;  Surgeon: Newt Minion, MD;  Location: [facility];  Service: Orthopedics;  Laterality: Right;  Right Foot 2nd Toe Amputation at MTP (metatarsophalangeal joint)  . AMPUTATION Right 04/25/2012   Procedure: Right Foot 3rd Toe Amputation;  Surgeon: Newt Minion, MD;  Location: [place];  Service: Orthopedics;  Laterality: Right;  Right Foot Third Toe Amputation   . AMPUTATION Right 07/27/2012   Procedure: Right 4th Toe Amputation at Metatarsophalangeal;  Surgeon: Newt Minion,  MD;  Location: [facility];  Service: Orthopedics;  Laterality: Right;  Right 4th Toe Amputation at Metatarsophalangeal  . AMPUTATION Left 07/15/2016   Procedure: Left 2nd Ray Amputation;  Surgeon: Newt Minion, MD;  Location: [facility];  Service: Orthopedics;  Laterality: Left;  . COLONOSCOPY    . LAPAROSCOPIC APPENDECTOMY  01/05/2011   Procedure: APPENDECTOMY LAPAROSCOPIC;  Surgeon: Pedro Earls, MD;  [facility];  Service: General;  Laterality: N/A;  . OOPHORECTOMY  2001  . ROTATOR CUFF REPAIR Right   . TUBAL LIGATION    . VAGINAL HYSTERECTOMY  2001   Social History  Occupational History  . Retired     Social History Main Topics  . Smoking status: Never Smoker  . Smokeless tobacco: Never Used  . Alcohol use No  . Drug use: No  . Sexual activity: Yes    Birth control/ protection: Surgical

## 2016-09-07 ENCOUNTER — Other Ambulatory Visit: Payer: Self-pay | Admitting: Gastroenterology

## 2016-09-16 ENCOUNTER — Ambulatory Visit (INDEPENDENT_AMBULATORY_CARE_PROVIDER_SITE_OTHER): Payer: Medicare Other | Admitting: Family Medicine

## 2016-09-16 ENCOUNTER — Encounter: Payer: Self-pay | Admitting: Family Medicine

## 2016-09-16 VITALS — BP 112/72 | HR 86 | Temp 97.9°F | Ht 65.0 in | Wt 228.6 lb

## 2016-09-16 DIAGNOSIS — I10 Essential (primary) hypertension: Secondary | ICD-10-CM

## 2016-09-16 DIAGNOSIS — E1169 Type 2 diabetes mellitus with other specified complication: Secondary | ICD-10-CM

## 2016-09-16 DIAGNOSIS — E785 Hyperlipidemia, unspecified: Secondary | ICD-10-CM | POA: Diagnosis not present

## 2016-09-16 DIAGNOSIS — G6289 Other specified polyneuropathies: Secondary | ICD-10-CM | POA: Diagnosis not present

## 2016-09-16 DIAGNOSIS — IMO0002 Reserved for concepts with insufficient information to code with codable children: Secondary | ICD-10-CM

## 2016-09-16 DIAGNOSIS — E1159 Type 2 diabetes mellitus with other circulatory complications: Secondary | ICD-10-CM | POA: Diagnosis not present

## 2016-09-16 DIAGNOSIS — E1165 Type 2 diabetes mellitus with hyperglycemia: Secondary | ICD-10-CM | POA: Diagnosis not present

## 2016-09-16 DIAGNOSIS — E1151 Type 2 diabetes mellitus with diabetic peripheral angiopathy without gangrene: Secondary | ICD-10-CM | POA: Diagnosis not present

## 2016-09-16 DIAGNOSIS — Z89422 Acquired absence of other left toe(s): Secondary | ICD-10-CM

## 2016-09-16 DIAGNOSIS — I152 Hypertension secondary to endocrine disorders: Secondary | ICD-10-CM

## 2016-09-16 NOTE — Patient Instructions (Signed)
BEFORE YOU LEAVE: -follow up:3 months with Dr. Maudie Mercury, nurse visit in 1 month for flu shot  Follow-up in interim as needed.  We recommend the following healthy lifestyle for LIFE: 1) Small portions.   Tip: eat off of a salad plate instead of a dinner plate.  Tip: It is ok to feel hungry after a meal - that likely means you ate an appropriate portion.  Tip: if you need more or a snack choose fruits, veggies and/or a handful of nuts or seeds.  2) Eat a healthy clean diet.   TRY TO EAT: -at least 5-7 servings of low sugar vegetables per day (not corn, potatoes or bananas.) -berries are the best choice if you wish to eat fruit.   -lean meets (fish, chicken or [place] breasts) -vegan proteins for some meals - beans or tofu, whole grains, nuts and seeds -Replace bad fats with good fats - good fats include: fish, nuts and seeds, canola oil, olive oil -small amounts of low fat or non fat dairy -small amounts of100 % whole grains - check the lables  AVOID: -SUGAR, sweets, anything with added sugar, corn syrup or sweeteners -if you must have a sweetener, small amounts of stevia may be best -sweetened beverages -simple starches (rice, bread, potatoes, pasta, chips, etc - small amounts of 100% whole grains are ok) -red meat, pork, butter -fried foods, fast food, processed food, excessive dairy, eggs and coconut.  3)Get at least 150 minutes of sweaty aerobic exercise per week.  4)Reduce stress - consider counseling, meditation and relaxation to balance other aspects of your life.

## 2016-09-16 NOTE — Progress Notes (Signed)
HPI:  Beth Holden is a pleasant 71 y.o. here for follow up. Chronic medical problems summarized below were reviewed for changes. Unfortunately, since her last visit she ended up having another amputation, left second ray for foot infection. Recovering okay from this. She is in a research study with heat and physical therapy for the leg to 8 in healing. She sees her podiatrist frequently for checkups. Her granddaughter is back in school and had a stomach bug and she's had a little nausea and a little sweating on and off this week. No diarrhea, vomiting or fevers. No symptoms with activity. She is doing chair exercises workout and has no chest pain or symptoms with this. She sees her diabetes doctor in a few days. Denies CP, SOB, DOE, treatment intolerance or new symptoms. Due for flu vaccine.  Diabetes: -complication: circulatory, neurological, diabetic foot ulcer -sees endocrinologist and podiatrist -meds: metformin, gabapentin, elavil, insulin, arb  Obesity, Hyperlipidemia, HTN: -meds: toprol XL (added by cardiology), losartan, statin  GERD/IBS: -sees GI  Palpitations: -saw cardiologist per her request 12/2015 -metoprolol added  ROS: See pertinent positives and negatives per HPI.  Past Medical History:  Diagnosis Date  . Alopecia   . Anemia, mild   . Arthritis   . Chronic cough    sees pulmonologist  . Chronic pain    chest wall and abd - s/p extensive eval  . Diabetes mellitus with neuropathy ([place])    sees endocrine  . Diverticulosis   . Fatty liver   . GERD (gastroesophageal reflux disease)    takes Nexium bid, hx erosive esophagitis  . Headache(784.0)    occasionally;r/t sinus   . History of colon polyps   . HTN (hypertension)   . Hx of amputation of lesser toe ([place])    sees podiatrist  . Hyperlipemia   . IBS (irritable bowel syndrome)   . Insomnia    takes Elavil nightly  . Joint pain   . Neuropathy   . Osteomyelitis ([place])   . PONV (postoperative nausea  and vomiting)   . Seasonal allergies    takes Zyrtec daily  . Sinus tachycardia     Past Surgical History:  Procedure Laterality Date  . AMPUTATION  12/28/2011   Procedure: AMPUTATION DIGIT;  Surgeon: Newt Minion, MD;  Location: [place] [facility];  Service: Orthopedics;  Laterality: Right;  Right Foot 2nd Toe Amputation at MTP (metatarsophalangeal joint)  . AMPUTATION Right 04/25/2012   Procedure: Right Foot 3rd Toe Amputation;  Surgeon: Newt Minion, MD;  Location: [facility];  Service: Orthopedics;  Laterality: Right;  Right Foot Third Toe Amputation   . AMPUTATION Right 07/27/2012   Procedure: Right 4th Toe Amputation at Metatarsophalangeal;  Surgeon: Newt Minion, MD;  Location: [facility];  Service: Orthopedics;  Laterality: Right;  Right 4th Toe Amputation at Metatarsophalangeal  . AMPUTATION Left 07/15/2016   Procedure: Left 2nd Ray Amputation;  Surgeon: Newt Minion, MD;  Location: [facility];  Service: Orthopedics;  Laterality: Left;  . COLONOSCOPY    . LAPAROSCOPIC APPENDECTOMY  01/05/2011   Procedure: APPENDECTOMY LAPAROSCOPIC;  Surgeon: Pedro Earls, MD;  [facility];  Service: General;  Laterality: N/A;  . OOPHORECTOMY  2001  . ROTATOR CUFF REPAIR Right   . TUBAL LIGATION    . VAGINAL HYSTERECTOMY  2001    Family History  Problem Relation Age of Onset  . Lung cancer Mother 70       smoked heavily  . Emphysema Mother   .  Hypertension Father   . Hyperlipidemia Father   . Diabetes Father   . Coronary artery disease Father   . Dementia Father   . COPD Father        smoked  . Stomach cancer Paternal Aunt   . Brain cancer Paternal Uncle   . Irritable bowel syndrome Unknown        Several family members on fathers side   . Diabetes Other   . Stomach cancer Maternal Aunt   . Colon cancer Neg Hx     Social History   Social History  . Marital status: Married    Spouse name: N/A  . Number of children: 2  . Years of education: N/A   Occupational History  . Retired     Social  History Main Topics  . Smoking status: Never Smoker  . Smokeless tobacco: Never Used  . Alcohol use No  . Drug use: No  . Sexual activity: Yes    Birth control/ protection: Surgical   Other Topics Concern  . None   Social History Narrative   Caffeine daily    [employer], [employer] no diploma   Married '66   1 dtr- '78; 1 son '71; 2 grandchildren   Occupation: retired 04   Dad with alzheimers-had to place in [place] (summer '10)           Current Outpatient Prescriptions:  .  acetaminophen (TYLENOL) 500 MG tablet, Take 500 mg by mouth every 8 (eight) hours as needed for mild pain. , Disp: , Rfl:  .  cetirizine (ZYRTEC) 10 MG tablet, Take 1 tablet (10 mg total) by mouth daily., Disp: 90 tablet, Rfl: 1 .  Cholecalciferol (VITAMIN D PO), Take 1 tablet by mouth daily. , Disp: , Rfl:  .  Fluocinolone Acetonide (DERMOTIC) 0.01 % OIL, Place 1 drop in ear(s) daily as needed (itching). , Disp: , Rfl:  .  gabapentin (NEURONTIN) 100 MG capsule, Take 2 tablets (600mg  total) by mouth at bedtime. (Patient taking differently: Take 200 mg by mouth at bedtime. ), Disp: 180 capsule, Rfl: 1 .  glucose blood (ONE TOUCH ULTRA TEST) test strip, USE TO TEST BLOOD SUGAR THREE TIMES DAILY AS INSTRUCTED, Disp: 300 each, Rfl: 1 .  insulin NPH Human (NOVOLIN N RELION) 100 UNIT/ML injection, Inject under skin 30 units in am and 30 units at bedtime (Patient taking differently: Inject 30 Units into the skin 2 (two) times daily before a meal. Inject under skin 30 units in am and 30 units at bedtime), Disp: 20 mL, Rfl: 11 .  insulin regular (NOVOLIN R RELION) 250 units/2.42mL (100 units/mL) injection, INJECT 12 TO 30 UNITS INTO THE SKIN THREE TIMES DAILY BEFORE MEALS (Patient taking differently: Inject 12-30 Units into the skin 3 (three) times daily before meals. INJECT 12 TO 30 UNITS INTO THE SKIN THREE TIMES DAILY BEFORE MEALS Takes additional 3-5 units with snack at bedtime if needed), Disp: 40 mL, Rfl: 11 .  losartan (COZAAR) 25  MG tablet, TAKE 1 TABLET BY MOUTH ONCE DAILY, Disp: 90 tablet, Rfl: 1 .  lovastatin (MEVACOR) 20 MG tablet, Take 1 tablet (20 mg total) by mouth at bedtime., Disp: 90 tablet, Rfl: 1 .  metFORMIN (GLUCOPHAGE) 1000 MG tablet, TAKE 1 TABLET TWICE A DAY WITH A MEAL (Patient taking differently: Take 1,000 mg by mouth 2 (two) times daily with a meal. ), Disp: 180 tablet, Rfl: 0 .  metoprolol succinate (TOPROL XL) 25 MG 24 hr tablet, Take 1 tablet (  25 mg total) by mouth daily., Disp: 30 tablet, Rfl: 11 .  naproxen sodium (ANAPROX) 220 MG tablet, Take 220 mg by mouth daily as needed (pain)., Disp: , Rfl:  .  omeprazole (PRILOSEC) 20 MG capsule, TAKE TWO CAPSULES BY MOUTH TWICE DAILY, Disp: 180 capsule, Rfl: 0 .  RELION INSULIN SYRINGE 1ML/31G 31G X 5/16" 1 ML MISC, USE 2 TIMES A DAY, Disp: 100 each, Rfl: 2 .  silver sulfADIAZINE (SILVADENE) 1 % cream, Apply 1 application topically daily., Disp: , Rfl:   EXAM:  Vitals:   09/16/16 1542  BP: 112/72  Pulse: 86  Temp: 97.9 F (36.6 C)    Body mass index is 38.04 kg/m.  GENERAL: vitals reviewed and listed above, alert, oriented, appears well hydrated and in no acute distress  HEENT: atraumatic, conjunttiva clear, no obvious abnormalities on inspection of external nose and ears  NECK: no obvious masses on inspection  LUNGS: clear to auscultation bilaterally, no wheezes, rales or rhonchi, good air movement  CV: HRRR, no peripheral edema  MS: moves all extremities without noticeable abnormality  PSYCH: pleasant and cooperative, no obvious depression or anxiety  ASSESSMENT AND PLAN:  Discussed the following assessment and plan:  Hypertension associated with diabetes ([place])  Hyperlipidemia associated with type 2 diabetes mellitus ([place])  Uncontrolled type 2 diabetes mellitus with peripheral circulatory disorder ([facility])  Status post amputation of lesser toe of left foot ([facility])  Other polyneuropathy  -continue current medications -diabetes  follow up next week as planned -advised healthy low sugar diet and regular exercise -no dairy for 1 week for ? Mild stomach bug, advised to follow up if worsens or persists -declined flu shot today, she may get later -Patient advised to return or notify a doctor immediately if symptoms worsen or persist or new concerns arise.  Patient Instructions  BEFORE YOU LEAVE: -follow up:3 months with Dr. Maudie Mercury, nurse visit in 1 month for flu shot  Follow-up in interim as needed.  We recommend the following healthy lifestyle for LIFE: 1) Small portions.   Tip: eat off of a salad plate instead of a dinner plate.  Tip: It is ok to feel hungry after a meal - that likely means you ate an appropriate portion.  Tip: if you need more or a snack choose fruits, veggies and/or a handful of nuts or seeds.  2) Eat a healthy clean diet.   TRY TO EAT: -at least 5-7 servings of low sugar vegetables per day (not corn, potatoes or bananas.) -berries are the best choice if you wish to eat fruit.   -lean meets (fish, chicken or [place] breasts) -vegan proteins for some meals - beans or tofu, whole grains, nuts and seeds -Replace bad fats with good fats - good fats include: fish, nuts and seeds, canola oil, olive oil -small amounts of low fat or non fat dairy -small amounts of100 % whole grains - check the lables  AVOID: -SUGAR, sweets, anything with added sugar, corn syrup or sweeteners -if you must have a sweetener, small amounts of stevia may be best -sweetened beverages -simple starches (rice, bread, potatoes, pasta, chips, etc - small amounts of 100% whole grains are ok) -red meat, pork, butter -fried foods, fast food, processed food, excessive dairy, eggs and coconut.  3)Get at least 150 minutes of sweaty aerobic exercise per week.  4)Reduce stress - consider counseling, meditation and relaxation to balance other aspects of your life.     Colin Benton R., DO

## 2016-09-19 ENCOUNTER — Ambulatory Visit (INDEPENDENT_AMBULATORY_CARE_PROVIDER_SITE_OTHER): Payer: Medicare Other | Admitting: Internal Medicine

## 2016-09-19 ENCOUNTER — Encounter: Payer: Self-pay | Admitting: Internal Medicine

## 2016-09-19 VITALS — BP 130/80 | HR 82 | Ht 64.5 in | Wt 227.0 lb

## 2016-09-19 DIAGNOSIS — E1165 Type 2 diabetes mellitus with hyperglycemia: Secondary | ICD-10-CM

## 2016-09-19 DIAGNOSIS — E1151 Type 2 diabetes mellitus with diabetic peripheral angiopathy without gangrene: Secondary | ICD-10-CM | POA: Diagnosis not present

## 2016-09-19 DIAGNOSIS — IMO0002 Reserved for concepts with insufficient information to code with codable children: Secondary | ICD-10-CM

## 2016-09-19 LAB — POCT GLYCOSYLATED HEMOGLOBIN (HGB A1C): Hemoglobin A1C: 7

## 2016-09-19 MED ORDER — INSULIN NPH (HUMAN) (ISOPHANE) 100 UNIT/ML ~~LOC~~ SUSP: SUBCUTANEOUS | 11 refills | Status: DC

## 2016-09-19 MED ORDER — INSULIN REGULAR HUMAN 100 UNIT/ML IJ SOLN: INTRAMUSCULAR | 11 refills | Status: DC

## 2016-09-19 NOTE — Progress Notes (Signed)
Subjective:     Patient ID: Beth Holden, female   DOB: Apr 26, 1945, 71 y.o.   MRN: [id number]  HPI Beth Holden is a pleasant 71 y.o. 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 Last visit 3 mo ago.  At last visit, she had L leg swelling and pain (Doppler negative, had a small Bakers cyst). She ended up having osteomyelitis in the left foot toe and had left second ray amputation in 07/2016.  At last visit, as she was having low CBG >> we moved NPH at bedtime >> sugars are better at this visit, But she continues to have hypoglycemia episodes.  Last HbA1C was: Lab Results  Component Value Date   HGBA1C 7.6 06/20/2016   HGBA1C 7.7 03/25/2016   HGBA1C 9.0 10/26/2015   She is now on: - Metformin 1000 mg bid  Insulin Before breakfast Before lunch Before dinner At bedtime  Regular  20 units with a small meal  30 units with a larger meal  8 units with a smaller meal  10 units with a larger meal  20 units with a smaller meal  30 units with a larger meal   NPH 25 x x 30    If you have a snack at night, please add 3-4 units of Regular (R) insulin.  - Sliding scale of R insulin: 150-200: + 2 unit 201-250: + 3 units 251-300: + 4 units >300: + 5 units  She also tried: - Levemir 47 units in am and 37 units in pm >> $800 for 3 mo supply - Januvia 100 mg daily - $450 for 3 mo - Invokana 100 mg - $455 for 3 mo - Took Byetta before We discussed about starting her on a VGo mechanical pump in the past. She had an appointment with diabetes education but decided not to pursue it.  She checks her sugars 3x a day: - am:  130-325 >> 125-236 >> 83-180, 250 >> 113-198, 238 (high after a low) - 2h after b'fast  301-349 >> 182-261, 313 >> 176-197, 305 >> 139-182 - prelunch:  69 x1 >> 136-241, 317 >> n/c >> 110-214 - 2-3h after lunch:52 (took 12 units with  lunch), 124-202, 265 >> n/c - before dinner:248, 257 >> 60 x1, 132-254 >> 54-245 >> 56, 110, 141, 177 - after dinner:96, 154-240 >> n/c >> 330, 407 >> 104-226 >> 183, 263 - bedtime: 160, 178, 226, 281 >> 123-313 >> 136-353 >> 124, 135, 300 (correction of a low) - nighttime: 46 x1, 98-214 >> 56 >> 47, 54-217 Lowest: 54, 79 >> 76 >> 60 >> 46 at night >> 55 at bedtime >> 47.  Has hypoglycemia awareness in the 70s Highest: 407 (after dinner) >> 374 (forgot insulin) >> 400s.  Meals: - Breakfast: egg, bacon, toast; grits; biscuit; fruit; sometimes skips - Lunch: 1/2 PB sandwich or soup and yoghurt, sometimes crackers - Dinner: meat + vegetables + some starch - Snacks: fruit   - No CKD. BUN  Date Value Ref Range Status  07/11/2016 16 6 - 23 mg/dL Final   Creatinine, Ser  Date Value Ref Range Status  07/11/2016 0.80 0.40 - 1.20 mg/dL Final  On enalapril.  - Latest lipid panel: Lab Results  Component Value Date   CHOL 166 11/30/2015   HDL 57.10 11/30/2015   LDLCALC 80 11/30/2015   TRIG 145.0 11/30/2015   CHOLHDL 3 11/30/2015  She is on Mevacor  - last eye exam in Spring 2018 (Dr. Zadie Rhine): + DR (mild), no glaucoma  - She has numbness and tingling in feet - Off amitriptyline, but continues on Gabapentin.  Review of Systems Constitutional: no weight gain/no weight loss, + fatigue, + subjective hyperthermia, no subjective hypothermia, + nocturia Eyes: no blurry vision, no xerophthalmia ENT: no sore throat, no nodules palpated in throat, no dysphagia, no odynophagia, no hoarseness Cardiovascular: no CP/no SOB/no palpitations/+ leg swelling Respiratory: no cough/no SOB/no wheezing Gastrointestinal: no N/no V/no D/no C/no acid reflux Musculoskeletal: no muscle aches/no joint aches Skin: no rashes, no hair loss Neurological: no tremors/no numbness/no tingling/no dizziness  I reviewed pt's medications, allergies, PMH, social hx, family hx, and changes were documented in the history of  present illness. Otherwise, unchanged from my initial visit note.  Objective:   Physical Exam BP 130/80 (BP Location: Left Arm, Patient Position: Sitting)   Pulse 82   Ht 5' 4.5" (1.638 m)   Wt 227 lb (103 kg)   SpO2 96%   BMI 38.36 kg/m  Body mass index is 38.36 kg/m. Wt Readings from Last 3 Encounters:  09/19/16 227 lb (103 kg)  09/16/16 228 lb 9.6 oz (103.7 kg)  08/11/16 229 lb (103.9 kg)   Constitutional: overweight, in NAD Eyes: PERRLA, EOMI, no exophthalmos ENT: moist mucous membranes, no thyromegaly, no cervical lymphadenopathy Cardiovascular: RRR, No MRG Respiratory: CTA B Gastrointestinal: abdomen soft, NT, ND, BS+ Musculoskeletal: strength intact in all 4, missing right toes: 2,3,4 and L toe: 2nd Skin: moist, warm, no rashes Neurological: no tremor with outstretched hands, DTR normal in all 4   Assessment:     1. DM2, uncontrolled, insulin-dependent, with complications - diabetic peripheral neuropathy - 3 toe amputations - after infected diabetic toe ulcers >> OM:  R 2nd toe amputated on 12/28/2011  R 3rd toe amputated on 04/25/2012  R 4th toe amputated on 07/27/2012  L 2nd toe amputated on 07/15/2016    2. 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 DM2 Patient with Long-standing uncontrolled diabetes, with fluctuating blood sugars at this visit, more consistent with insulin deficiency (LADA pattern).  - Her lows can appear at night but also during the day and these are followed by hyperglycemic spikes. We discussed that we initially need to get rid of the lows to be able to control her highs. -  were now, I advised her to continue NPH at bedtime, but reduce the dose and will also reduce the largest regular insulin dose before every meal  - I advised her to: Patient Instructions   Please continue: - Metformin 1000 mg bid  Please decrease: Insulin Before breakfast Before lunch Before dinner At bedtime  Regular  20 units with a small meal  25 units with a larger  meal  5 units with a smaller meal  8 units with a larger meal  20 units with a smaller meal  25 units with a larger meal   NPH 25 x x 25   - Sliding scale of R insulin: 150-200: + 2 unit 201-250: + 3 units 251-300: + 4 units >300: + 5 units  - today, HbA1c is 7% (better!) - continue checking sugars at different times of the day - check 3x a day, rotating checks - advised for yearly eye exams >> she is UTD - Return to clinic in 3 mo with sugar log    2. 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

## 2016-09-19 NOTE — Patient Instructions (Signed)
Please continue: - Metformin 1000 mg bid  Please decrease: Insulin Before breakfast Before lunch Before dinner At bedtime  Regular  20 units with a small meal  25 units with a larger meal  5 units with a smaller meal  8 units with a larger meal  20 units with a smaller meal  25 units with a larger meal   NPH 25 x x 25   - Sliding scale of R insulin: 150-200: + 2 unit 201-250: + 3 units 251-300: + 4 units >300: + 5 units

## 2016-09-26 ENCOUNTER — Ambulatory Visit (INDEPENDENT_AMBULATORY_CARE_PROVIDER_SITE_OTHER): Payer: Medicare Other | Admitting: Orthopedic Surgery

## 2016-09-26 DIAGNOSIS — B351 Tinea unguium: Secondary | ICD-10-CM

## 2016-09-26 DIAGNOSIS — L97511 Non-pressure chronic ulcer of other part of right foot limited to breakdown of skin: Secondary | ICD-10-CM

## 2016-09-26 DIAGNOSIS — Z89422 Acquired absence of other left toe(s): Secondary | ICD-10-CM

## 2016-09-26 NOTE — Progress Notes (Signed)
Office Visit Note   [name]           Date of Birth: 05-08-1945           MRN: [id number] Visit Date: 09/26/2016              Requested by: Lucretia Kern, DO [address] [place], [place] [address] PCP: Lucretia Kern, DO  No chief complaint on file.     HPI: Patient is a 71 year old woman who presents for follow-up for both lower extremities. Patient is currently in the study involving the TENS unit. She is currently wearing compression stockings for her venous insufficiency has a Band-Aid over the ulcer on the right foot and complains of painful onychomycotic nails 5.  Assessment & Plan: Visit Diagnoses:  1. Status post amputation of lesser toe of left foot ([place])   2. Onychomycosis   3. Right foot ulcer, limited to breakdown of skin ([place])     Plan: Nails trimmed 5 ulcer debrided of skin and soft tissue right foot continue in the TENS unit study.  Follow-Up Instructions: Return in about 4 weeks (around 10/24/2016).   Ortho Exam  Patient is alert, oriented, no adenopathy, well-dressed, normal affect, normal respiratory effort. Examination patient has an antalgic gait. She has thick and discolored onychomycotic nails 5 of the nails were trimmed 5 without complications. Patient has venous stasis swelling of both legs but no open ulcers. She has no cellulitis in either foot she does not have protective sensation. She has a Wagner grade 1 ulcer beneath the third metatarsal head right foot. After informed consent a 10 blade knife was used to debride the skin and soft tissue back to bleeding viable granulation tissue the wound was 7 mm in diameter 0.1 mm deep there is no depth the wound bed has good granulation tissue. Patient's hemoglobin A1c continues to improve.  Imaging: No results found. No images are attached to the encounter.  Labs: Lab Results  Component Value Date   HGBA1C 7.0 09/19/2016   HGBA1C 7.6 06/20/2016   HGBA1C 7.7 03/25/2016   LABURIC 5.5 09/01/2015   LABORGA GROUP B STREP (S.AGALACTIAE) ISOLATED 12/26/2014    Orders:  No orders of the defined types were placed in this encounter.  No orders of the defined types were placed in this encounter.    Procedures: No procedures performed  Clinical Data: No additional findings.  ROS:  All other systems negative, except as noted in the HPI. Review of Systems  Objective: Vital Signs: There were no vitals taken for this visit.  Specialty Comments:  No specialty comments available.  PMFS History: Patient Active Problem List   Diagnosis Date Noted  . Right foot ulcer, limited to breakdown of skin ([place]) 09/26/2016  . Osteomyelitis of toe of left foot ([place])   . Baker's cyst 07/10/2016  . Tachycardia 03/04/2016  . Onychomycosis 12/07/2015  . Non-pressure chronic ulcer of other part of right foot limited to breakdown of skin ([place]) 12/07/2015  . Upper airway cough syndrome 03/05/2014  . S/P amputation of lesser toe - followed by Dr. Sharol Given 04/15/2013  . Pain in joint, shoulder region, s/p RTC surgery - followed by Dr. Sharol Given 04/15/2013  . ANEMIA, MILD 02/04/2010  . Essential hypertension 12/05/2006  . Uncontrolled type 2 diabetes mellitus with peripheral circulatory disorder ([place]) 10/12/2006  . Hyperlipidemia 10/12/2006  . Allergic rhinitis 10/12/2006  . GERD - Followed by Dr. Fuller Plan 10/12/2006  . IRRITABLE BOWEL SYNDROME - followed by  Dr. Fuller Plan 10/12/2006  . Peripheral neuropathy 10/11/2006  . ALOPECIA NEC 10/11/2006   Past Medical History:  Diagnosis Date  . Alopecia   . Anemia, mild   . Arthritis   . Chronic cough    sees pulmonologist  . Chronic pain    chest wall and abd - s/p extensive eval  . Diabetes mellitus with neuropathy ([place])    sees endocrine  . Diverticulosis   . Fatty liver   . GERD (gastroesophageal reflux disease)    takes Nexium bid, hx erosive esophagitis  . Headache(784.0)    occasionally;r/t sinus   . History of colon polyps    . HTN (hypertension)   . Hx of amputation of lesser toe ([place])    sees podiatrist  . Hyperlipemia   . IBS (irritable bowel syndrome)   . Insomnia    takes Elavil nightly  . Joint pain   . Neuropathy   . Osteomyelitis ([place])   . PONV (postoperative nausea and vomiting)   . Seasonal allergies    takes Zyrtec daily  . Sinus tachycardia     Family History  Problem Relation Age of Onset  . Lung cancer Mother 26       smoked heavily  . Emphysema Mother   . Hypertension Father   . Hyperlipidemia Father   . Diabetes Father   . Coronary artery disease Father   . Dementia Father   . COPD Father        smoked  . Stomach cancer Paternal Aunt   . Brain cancer Paternal Uncle   . Irritable bowel syndrome Unknown        Several family members on fathers side   . Diabetes Other   . Stomach cancer Maternal Aunt   . Colon cancer Neg Hx     Past Surgical History:  Procedure Laterality Date  . AMPUTATION  12/28/2011   Procedure: AMPUTATION DIGIT;  Surgeon: Newt Minion, MD;  Location: [facility];  Service: Orthopedics;  Laterality: Right;  Right Foot 2nd Toe Amputation at MTP (metatarsophalangeal joint)  . AMPUTATION Right 04/25/2012   Procedure: Right Foot 3rd Toe Amputation;  Surgeon: Newt Minion, MD;  Location: [facility];  Service: Orthopedics;  Laterality: Right;  Right Foot Third Toe Amputation   . AMPUTATION Right 07/27/2012   Procedure: Right 4th Toe Amputation at Metatarsophalangeal;  Surgeon: Newt Minion, MD;  Location: [facility];  Service: Orthopedics;  Laterality: Right;  Right 4th Toe Amputation at Metatarsophalangeal  . AMPUTATION Left 07/15/2016   Procedure: Left 2nd Ray Amputation;  Surgeon: Newt Minion, MD;  Location: [facility];  Service: Orthopedics;  Laterality: Left;  . COLONOSCOPY    . LAPAROSCOPIC APPENDECTOMY  01/05/2011   Procedure: APPENDECTOMY LAPAROSCOPIC;  Surgeon: Pedro Earls, MD;  Location[facility];  Service: General;  Laterality: N/A;  . OOPHORECTOMY  2001  . ROTATOR  CUFF REPAIR Right   . TUBAL LIGATION    . VAGINAL HYSTERECTOMY  2001   Social History   Occupational History  . Retired     Social History Main Topics  . Smoking status: Never Smoker  . Smokeless tobacco: Never Used  . Alcohol use No  . Drug use: No  . Sexual activity: Yes    Birth control/ protection: Surgical

## 2016-09-28 ENCOUNTER — Telehealth (INDEPENDENT_AMBULATORY_CARE_PROVIDER_SITE_OTHER): Payer: Self-pay

## 2016-09-28 NOTE — Telephone Encounter (Signed)
Patient called and left VM on Triage phone. States she was just seen on 09/26/16. states she has a problem with left foot, "swollen and leaking fluid." Status post amputation of lesser toe of left foot.  Would like a Call back [phone]

## 2016-09-28 NOTE — Telephone Encounter (Signed)
I called and spoke with patient and advised her to come in tomorrow at 1045 to see MD.

## 2016-09-29 ENCOUNTER — Encounter: Payer: Self-pay | Admitting: Family Medicine

## 2016-09-29 ENCOUNTER — Ambulatory Visit (INDEPENDENT_AMBULATORY_CARE_PROVIDER_SITE_OTHER): Payer: Medicare Other | Admitting: Orthopedic Surgery

## 2016-09-29 DIAGNOSIS — Z89422 Acquired absence of other left toe(s): Secondary | ICD-10-CM

## 2016-09-29 NOTE — Progress Notes (Signed)
Office Visit Note   [name]           Date of Birth: 14-Jan-1945           MRN: [id number] Visit Date: 09/29/2016              Requested by: Lucretia Kern, DO [address] [place], [place] [address] PCP: Lucretia Kern, DO  Chief Complaint  Patient presents with  . Right Foot - Follow-up      HPI: Patient presents in follow-up for left foot second ray amputation and she is also developing swelling in the third toe she wears open toed compression stockings which is causing increased swelling through her toes. Patient states that she has a previous burn to her foot and cannot tolerate anything touching her toes.  Assessment & Plan: Visit Diagnoses:  1. Status post amputation of lesser toe of left foot ([place])     Plan: Recommended a closed toed compression stocking. Discussed that without compression across the forefoot the toes would blister and swell further and she would be at risk for amputation of additional lesser toes.  Follow-Up Instructions: Return in about 4 weeks (around 10/27/2016).   Ortho Exam  Patient is alert, oriented, no adenopathy, well-dressed, normal affect, normal respiratory effort. Semination patient has an antalgic gait. She has increasing swelling of the toes secondary to her open toed compression stocking. There is clear drainage ulceration over the third toe with swelling.  Imaging: No results found. No images are attached to the encounter.  Labs: Lab Results  Component Value Date   HGBA1C 7.0 09/19/2016   HGBA1C 7.6 06/20/2016   HGBA1C 7.7 03/25/2016   LABURIC 5.5 09/01/2015   LABORGA GROUP B STREP (S.AGALACTIAE) ISOLATED 12/26/2014    Orders:  No orders of the defined types were placed in this encounter.  No orders of the defined types were placed in this encounter.    Procedures: No procedures performed  Clinical Data: No additional findings.  ROS:  All other systems negative, except as noted in the  HPI. Review of Systems  Objective: Vital Signs: There were no vitals taken for this visit.  Specialty Comments:  No specialty comments available.  PMFS History: Patient Active Problem List   Diagnosis Date Noted  . Right foot ulcer, limited to breakdown of skin ([place]) 09/26/2016  . Osteomyelitis of toe of left foot ([place])   . Baker's cyst 07/10/2016  . Tachycardia 03/04/2016  . Onychomycosis 12/07/2015  . Non-pressure chronic ulcer of other part of right foot limited to breakdown of skin ([place]) 12/07/2015  . Upper airway cough syndrome 03/05/2014  . S/P amputation of lesser toe - followed by Dr. Sharol Given 04/15/2013  . Pain in joint, shoulder region, s/p RTC surgery - followed by Dr. Sharol Given 04/15/2013  . ANEMIA, MILD 02/04/2010  . Essential hypertension 12/05/2006  . Uncontrolled type 2 diabetes mellitus with peripheral circulatory disorder ([place]) 10/12/2006  . Hyperlipidemia 10/12/2006  . Allergic rhinitis 10/12/2006  . GERD - Followed by Dr. Fuller Plan 10/12/2006  . IRRITABLE BOWEL SYNDROME - followed by Dr. Fuller Plan 10/12/2006  . Peripheral neuropathy 10/11/2006  . ALOPECIA NEC 10/11/2006   Past Medical History:  Diagnosis Date  . Alopecia   . Anemia, mild   . Arthritis   . Chronic cough    sees pulmonologist  . Chronic pain    chest wall and abd - s/p extensive eval  . Diabetes mellitus with neuropathy ([place])    sees endocrine  .  Diverticulosis   . Fatty liver   . GERD (gastroesophageal reflux disease)    takes Nexium bid, hx erosive esophagitis  . Headache(784.0)    occasionally;r/t sinus   . History of colon polyps   . HTN (hypertension)   . Hx of amputation of lesser toe ([place])    sees podiatrist  . Hyperlipemia   . IBS (irritable bowel syndrome)   . Insomnia    takes Elavil nightly  . Joint pain   . Neuropathy   . Osteomyelitis ([place])   . PONV (postoperative nausea and vomiting)   . Seasonal allergies    takes Zyrtec daily  . Sinus tachycardia     Family History   Problem Relation Age of Onset  . Lung cancer Mother 68       smoked heavily  . Emphysema Mother   . Hypertension Father   . Hyperlipidemia Father   . Diabetes Father   . Coronary artery disease Father   . Dementia Father   . COPD Father        smoked  . Stomach cancer Paternal Aunt   . Brain cancer Paternal Uncle   . Irritable bowel syndrome Unknown        Several family members on fathers side   . Diabetes Other   . Stomach cancer Maternal Aunt   . Colon cancer Neg Hx     Past Surgical History:  Procedure Laterality Date  . AMPUTATION  12/28/2011   Procedure: AMPUTATION DIGIT;  Surgeon: Newt Minion, MD;  Location: [facility];  Service: Orthopedics;  Laterality: Right;  Right Foot 2nd Toe Amputation at MTP (metatarsophalangeal joint)  . AMPUTATION Right 04/25/2012   Procedure: Right Foot 3rd Toe Amputation;  Surgeon: Newt Minion, MD;  Location: [facility];  Service: Orthopedics;  Laterality: Right;  Right Foot Third Toe Amputation   . AMPUTATION Right 07/27/2012   Procedure: Right 4th Toe Amputation at Metatarsophalangeal;  Surgeon: Newt Minion, MD;  Location: [facility];  Service: Orthopedics;  Laterality: Right;  Right 4th Toe Amputation at Metatarsophalangeal  . AMPUTATION Left 07/15/2016   Procedure: Left 2nd Ray Amputation;  Surgeon: Newt Minion, MD;  Location: [facility];  Service: Orthopedics;  Laterality: Left;  . COLONOSCOPY    . LAPAROSCOPIC APPENDECTOMY  01/05/2011   Procedure: APPENDECTOMY LAPAROSCOPIC;  Surgeon: Pedro Earls, MD;  [facility];  Service: General;  Laterality: N/A;  . OOPHORECTOMY  2001  . ROTATOR CUFF REPAIR Right   . TUBAL LIGATION    . VAGINAL HYSTERECTOMY  2001   Social History   Occupational History  . Retired     Social History Main Topics  . Smoking status: Never Smoker  . Smokeless tobacco: Never Used  . Alcohol use No  . Drug use: No  . Sexual activity: Yes    Birth control/ protection: Surgical

## 2016-10-24 ENCOUNTER — Encounter (INDEPENDENT_AMBULATORY_CARE_PROVIDER_SITE_OTHER): Payer: Self-pay | Admitting: Orthopedic Surgery

## 2016-10-24 ENCOUNTER — Ambulatory Visit (INDEPENDENT_AMBULATORY_CARE_PROVIDER_SITE_OTHER): Payer: Medicare Other | Admitting: Orthopedic Surgery

## 2016-10-24 DIAGNOSIS — L97511 Non-pressure chronic ulcer of other part of right foot limited to breakdown of skin: Secondary | ICD-10-CM | POA: Diagnosis not present

## 2016-10-24 DIAGNOSIS — Z89422 Acquired absence of other left toe(s): Secondary | ICD-10-CM

## 2016-10-24 NOTE — Progress Notes (Signed)
Office Visit Note   [name]           Date of Birth: Jan 27, 1945           MRN: [id number] Visit Date: 10/24/2016              Requested by: Lucretia Kern, DO [address] [place], [place] [address] PCP: Lucretia Kern, DO  Chief Complaint  Patient presents with  . Right Foot - Wound Check  . Left Foot - Wound Check      HPI: Patient is a 71 year old woman who presents in follow-up for ulceration third and fourth metatarsal heads right foot. Patient was involved in a TENS units study and she feels like this may have helped. Patient states that she has been wearing medical compression stockings and did develop a rash after prolonged activities.  Assessment & Plan: Visit Diagnoses:  1. Right foot ulcer, limited to breakdown of skin ([place])   2. Status post amputation of lesser toe of left foot ([place])     Plan: recommended that she get a pair of new balance walking sneakers or a corn shoes. Discussed the sandals she is currently wearing are not providing her enough unloading across the forefoot it is causing her to have ulcers on the plantar aspect of her toes from her gripping the sandal with her toes. She will resume wearing the medical compression stockings during the day she does have some dermatitis which appears to be resolving.  Follow-Up Instructions: Return in about 4 weeks (around 11/21/2016).   Ortho Exam  Patient is alert, oriented, no adenopathy, well-dressed, normal affect, normal respiratory effort. Examination patient has a good dorsalis pedis pulse bilaterally. She does have some dermatitis on the dorsum of the left foot with a small blister around the IP joint of the left great toe this is not full-thickness there is no paronychial infection. Examination the right foot she still has a persistent Wagner grade 1 ulcer. After informed consent a 10 blade knife was used to debride the skin and soft tissue back to bleeding viable granulation tissue. The  ulcer is 2 cm in diameter and 2 mm deep. There is no exposed bone or tendon. Her wound appears essentially unchanged. She will continue wearing the over the counter pressure relieving pad and a Band-Aid.  Imaging: No results found. No images are attached to the encounter.  Labs: Lab Results  Component Value Date   HGBA1C 7.0 09/19/2016   HGBA1C 7.6 06/20/2016   HGBA1C 7.7 03/25/2016   LABURIC 5.5 09/01/2015   LABORGA GROUP B STREP (S.AGALACTIAE) ISOLATED 12/26/2014    Orders:  No orders of the defined types were placed in this encounter.  No orders of the defined types were placed in this encounter.    Procedures: No procedures performed  Clinical Data: No additional findings.  ROS:  All other systems negative, except as noted in the HPI. Review of Systems  Objective: Vital Signs: There were no vitals taken for this visit.  Specialty Comments:  No specialty comments available.  PMFS History: Patient Active Problem List   Diagnosis Date Noted  . Right foot ulcer, limited to breakdown of skin ([place]) 09/26/2016  . Osteomyelitis of toe of left foot ([place])   . Baker's cyst 07/10/2016  . Tachycardia 03/04/2016  . Onychomycosis 12/07/2015  . Non-pressure chronic ulcer of other part of right foot limited to breakdown of skin ([place]) 12/07/2015  . Upper airway cough syndrome 03/05/2014  .  S/P amputation of lesser toe - followed by Dr. Sharol Given 04/15/2013  . Pain in joint, shoulder region, s/p RTC surgery - followed by Dr. Sharol Given 04/15/2013  . ANEMIA, MILD 02/04/2010  . Essential hypertension 12/05/2006  . Uncontrolled type 2 diabetes mellitus with peripheral circulatory disorder ([place]) 10/12/2006  . Hyperlipidemia 10/12/2006  . Allergic rhinitis 10/12/2006  . GERD - Followed by Dr. Fuller Plan 10/12/2006  . IRRITABLE BOWEL SYNDROME - followed by Dr. Fuller Plan 10/12/2006  . Peripheral neuropathy 10/11/2006  . ALOPECIA NEC 10/11/2006   Past Medical History:  Diagnosis Date  . Alopecia    . Anemia, mild   . Arthritis   . Chronic cough    sees pulmonologist  . Chronic pain    chest wall and abd - s/p extensive eval  . Diabetes mellitus with neuropathy ([place])    sees endocrine  . Diverticulosis   . Fatty liver   . GERD (gastroesophageal reflux disease)    takes Nexium bid, hx erosive esophagitis  . Headache(784.0)    occasionally;r/t sinus   . History of colon polyps   . HTN (hypertension)   . Hx of amputation of lesser toe ([place])    sees podiatrist  . Hyperlipemia   . IBS (irritable bowel syndrome)   . Insomnia    takes Elavil nightly  . Joint pain   . Neuropathy   . Osteomyelitis ([place])   . PONV (postoperative nausea and vomiting)   . Seasonal allergies    takes Zyrtec daily  . Sinus tachycardia     Family History  Problem Relation Age of Onset  . Lung cancer Mother 29       smoked heavily  . Emphysema Mother   . Hypertension Father   . Hyperlipidemia Father   . Diabetes Father   . Coronary artery disease Father   . Dementia Father   . COPD Father        smoked  . Stomach cancer Paternal Aunt   . Brain cancer Paternal Uncle   . Irritable bowel syndrome Unknown        Several family members on fathers side   . Diabetes Other   . Stomach cancer Maternal Aunt   . Colon cancer Neg Hx     Past Surgical History:  Procedure Laterality Date  . AMPUTATION  12/28/2011   Procedure: AMPUTATION DIGIT;  Surgeon: Newt Minion, MD;  Location: [facility];  Service: Orthopedics;  Laterality: Right;  Right Foot 2nd Toe Amputation at MTP (metatarsophalangeal joint)  . AMPUTATION Right 04/25/2012   Procedure: Right Foot 3rd Toe Amputation;  Surgeon: Newt Minion, MD;  Location: [facility];  Service: Orthopedics;  Laterality: Right;  Right Foot Third Toe Amputation   . AMPUTATION Right 07/27/2012   Procedure: Right 4th Toe Amputation at Metatarsophalangeal;  Surgeon: Newt Minion, MD;  Location: [facility];  Service: Orthopedics;  Laterality: Right;  Right 4th Toe Amputation at  Metatarsophalangeal  . AMPUTATION Left 07/15/2016   Procedure: Left 2nd Ray Amputation;  Surgeon: Newt Minion, MD;  Location: [facility];  Service: Orthopedics;  Laterality: Left;  . COLONOSCOPY    . LAPAROSCOPIC APPENDECTOMY  01/05/2011   Procedure: APPENDECTOMY LAPAROSCOPIC;  Surgeon: Pedro Earls, MD;  Location: [facility];  Service: General;  Laterality: N/A;  . OOPHORECTOMY  2001  . ROTATOR CUFF REPAIR Right   . TUBAL LIGATION    . VAGINAL HYSTERECTOMY  2001   Social History   Occupational History  . Retired  Social History Main Topics  . Smoking status: Never Smoker  . Smokeless tobacco: Never Used  . Alcohol use No  . Drug use: No  . Sexual activity: Yes    Birth control/ protection: Surgical

## 2016-10-31 ENCOUNTER — Ambulatory Visit (INDEPENDENT_AMBULATORY_CARE_PROVIDER_SITE_OTHER): Payer: Medicare Other | Admitting: Orthopedic Surgery

## 2016-11-08 ENCOUNTER — Ambulatory Visit (INDEPENDENT_AMBULATORY_CARE_PROVIDER_SITE_OTHER): Payer: Medicare Other | Admitting: *Deleted

## 2016-11-08 DIAGNOSIS — Z23 Encounter for immunization: Secondary | ICD-10-CM

## 2016-11-17 ENCOUNTER — Encounter (INDEPENDENT_AMBULATORY_CARE_PROVIDER_SITE_OTHER): Payer: Self-pay | Admitting: Orthopedic Surgery

## 2016-11-17 ENCOUNTER — Other Ambulatory Visit (INDEPENDENT_AMBULATORY_CARE_PROVIDER_SITE_OTHER): Payer: Self-pay | Admitting: Family

## 2016-11-17 ENCOUNTER — Ambulatory Visit (INDEPENDENT_AMBULATORY_CARE_PROVIDER_SITE_OTHER): Payer: Medicare Other | Admitting: Orthopedic Surgery

## 2016-11-17 ENCOUNTER — Encounter (HOSPITAL_COMMUNITY): Payer: Self-pay | Admitting: *Deleted

## 2016-11-17 ENCOUNTER — Ambulatory Visit (INDEPENDENT_AMBULATORY_CARE_PROVIDER_SITE_OTHER): Payer: Medicare Other

## 2016-11-17 VITALS — Ht 64.0 in | Wt 227.0 lb

## 2016-11-17 DIAGNOSIS — M86172 Other acute osteomyelitis, left ankle and foot: Secondary | ICD-10-CM

## 2016-11-17 DIAGNOSIS — L97511 Non-pressure chronic ulcer of other part of right foot limited to breakdown of skin: Secondary | ICD-10-CM

## 2016-11-17 NOTE — Progress Notes (Signed)
Office Visit Note   [name]           Date of Birth: 08/24/1945           MRN: [id number] Visit Date: 11/17/2016              Requested by: Lucretia Kern, DO [address] [place], [place] [address] PCP: Lucretia Kern, DO  Chief Complaint  Patient presents with  . Left Foot - Pain    New ulceration on her left foot 4th toe.  . Right Foot - Follow-up      HPI: Patient presents with acute redness swelling and drainage from the ulcer of the fourth toe left foot with redness and cellulitis of the third toe left foot.  Patient also complains of a chronic right foot fourth metatarsal head ulcer.  Assessment & Plan: Visit Diagnoses:  1. Acute osteomyelitis of toe, left ([place])   2. Right foot ulcer, limited to breakdown of skin ([place])   3. Non-pressure chronic ulcer of other part of right foot limited to breakdown of skin ([place])     Plan: With the radiographs showing osteomyelitis involving the third and fourth toe with cellulitis and swelling up to her ankle with fillets important to proceed with a third and fourth ray amputation.  She is status post a second ray amputation.  Patient does not want to disturb the great toe or little toe.  Risks and benefits were discussed including recurrent infection need for additional surgery.  Patient states she understands wished to proceed at this time will plan for outpatient surgery tomorrow.  Follow-Up Instructions: Return in about 1 week (around 11/24/2016).   Ortho Exam  Patient is alert, oriented, no adenopathy, well-dressed, normal affect, normal respiratory effort. Patient patient has a palpable dorsalis pedis pulse she has significant swelling in the left lower extremity.  There is swelling up to the ankle there is cellulitis involving the third and fourth toe.  There is a draining ulcer on the tip of the fourth toe which probes down to bone.  Examination of the right foot patient has no swelling she has a good pulse  she has a chronic ulcer beneath the fourth metatarsal head.  After informed consent a 10 blade knife was used to debride the skin and soft tissue back to healthy viable granulation tissue.  The ulcer is 10 mm in diameter 1 mm deep silver nitrate was used for hemostasis this did not probe to bone or tendon.  A Band-Aid was applied and a pressure relieving donut.  Imaging: Xr Foot Complete Left  Result Date: 11/17/2016 3 view radiographs of the left foot shows previous second ray amputation.  There is osteomyelitis involving the proximal phalanx of the third toe and osteomyelitis involving the tuft of the fourth toe left foot.  No images are attached to the encounter.  Labs: Lab Results  Component Value Date   HGBA1C 7.0 09/19/2016   HGBA1C 7.6 06/20/2016   HGBA1C 7.7 03/25/2016   LABURIC 5.5 09/01/2015   LABORGA GROUP B STREP (S.AGALACTIAE) ISOLATED 12/26/2014    Orders:  Orders Placed This Encounter  Procedures  . XR Foot Complete Left   No orders of the defined types were placed in this encounter.    Procedures: No procedures performed  Clinical Data: No additional findings.  ROS:  All other systems negative, except as noted in the HPI. Review of Systems  Objective: Vital Signs: Ht 5\' 4"  (1.626 m)  Wt 227 lb (103 kg)   BMI 38.96 kg/m   Specialty Comments:  No specialty comments available.  PMFS History: Patient Active Problem List   Diagnosis Date Noted  . Right foot ulcer, limited to breakdown of skin ([place]) 09/26/2016  . Osteomyelitis of toe of left foot ([place])   . Baker's cyst 07/10/2016  . Tachycardia 03/04/2016  . Onychomycosis 12/07/2015  . Non-pressure chronic ulcer of other part of right foot limited to breakdown of skin ([place]) 12/07/2015  . Upper airway cough syndrome 03/05/2014  . S/P amputation of lesser toe - followed by Dr. Sharol Given 04/15/2013  . Pain in joint, shoulder region, s/p RTC surgery - followed by Dr. Sharol Given 04/15/2013  . ANEMIA, MILD  02/04/2010  . Essential hypertension 12/05/2006  . Uncontrolled type 2 diabetes mellitus with peripheral circulatory disorder ([place]) 10/12/2006  . Hyperlipidemia 10/12/2006  . Allergic rhinitis 10/12/2006  . GERD - Followed by Dr. Fuller Plan 10/12/2006  . IRRITABLE BOWEL SYNDROME - followed by Dr. Fuller Plan 10/12/2006  . Peripheral neuropathy 10/11/2006  . ALOPECIA NEC 10/11/2006   Past Medical History:  Diagnosis Date  . Alopecia   . Anemia, mild   . Arthritis   . Chronic cough    sees pulmonologist  . Chronic pain    chest wall and abd - s/p extensive eval  . Diabetes mellitus with neuropathy ([place])    sees endocrine  . Diverticulosis   . Fatty liver   . GERD (gastroesophageal reflux disease)    takes Nexium bid, hx erosive esophagitis  . Headache(784.0)    occasionally;r/t sinus   . History of colon polyps   . HTN (hypertension)   . Hx of amputation of lesser toe ([place])    sees podiatrist  . Hyperlipemia   . IBS (irritable bowel syndrome)   . Insomnia    takes Elavil nightly  . Joint pain   . Neuropathy   . Osteomyelitis ([place])   . PONV (postoperative nausea and vomiting)   . Seasonal allergies    takes Zyrtec daily  . Sinus tachycardia     Family History  Problem Relation Age of Onset  . Lung cancer Mother 36       smoked heavily  . Emphysema Mother   . Hypertension Father   . Hyperlipidemia Father   . Diabetes Father   . Coronary artery disease Father   . Dementia Father   . COPD Father        smoked  . Stomach cancer Paternal Aunt   . Brain cancer Paternal Uncle   . Irritable bowel syndrome Unknown        Several family members on fathers side   . Diabetes Other   . Stomach cancer Maternal Aunt   . Colon cancer Neg Hx     Past Surgical History:  Procedure Laterality Date  . COLONOSCOPY    . OOPHORECTOMY  2001  . ROTATOR CUFF REPAIR Right   . TUBAL LIGATION    . VAGINAL HYSTERECTOMY  2001   Social History   Occupational History  . Occupation: Retired    Tobacco Use  . Smoking status: Never Smoker  . Smokeless tobacco: Never Used  Substance and Sexual Activity  . Alcohol use: No  . Drug use: No  . Sexual activity: Yes    Birth control/protection: Surgical

## 2016-11-17 NOTE — Progress Notes (Signed)
Spoke with pt for pre-op call. Pt has hx of sinus tachycardia and takes Metoprolol for it. She denies any chest pain or sob. Pt is a type 2 diabetic, last A1C was 7.0 on 09/19/16. She states her fasting blood sugar was 168 this AM. Instructed pt to take 1/2 of her regular dose of Novolin N tonight and in the AM, will take 12 units. Instructed pt to check blood sugar in the AM when she gets up. If blood sugar is 70 or below, treat with 1/2 cup of clear juice (apple or cranberry) and recheck blood sugar 15 minutes after drinking juice. Pt voiced understanding.

## 2016-11-18 ENCOUNTER — Ambulatory Visit (HOSPITAL_COMMUNITY)
Admission: RE | Admit: 2016-11-18 | Discharge: 2016-11-18 | Disposition: A | Discharge status: Home / Self Care | Payer: Medicare Other | Source: Ambulatory Visit | Attending: Orthopedic Surgery | Admitting: Orthopedic Surgery

## 2016-11-18 ENCOUNTER — Encounter (HOSPITAL_COMMUNITY): Payer: Self-pay | Admitting: *Deleted

## 2016-11-18 ENCOUNTER — Telehealth: Payer: Self-pay | Admitting: Internal Medicine

## 2016-11-18 ENCOUNTER — Encounter (HOSPITAL_COMMUNITY)
Admission: RE | Disposition: A | Discharge status: Home / Self Care | Payer: Self-pay | Source: Ambulatory Visit | Attending: Orthopedic Surgery

## 2016-11-18 ENCOUNTER — Ambulatory Visit (HOSPITAL_COMMUNITY): Payer: Medicare Other | Admitting: Anesthesiology

## 2016-11-18 DIAGNOSIS — M868X7 Other osteomyelitis, ankle and foot: Secondary | ICD-10-CM | POA: Diagnosis not present

## 2016-11-18 DIAGNOSIS — E1169 Type 2 diabetes mellitus with other specified complication: Secondary | ICD-10-CM | POA: Diagnosis not present

## 2016-11-18 DIAGNOSIS — L97511 Non-pressure chronic ulcer of other part of right foot limited to breakdown of skin: Secondary | ICD-10-CM | POA: Diagnosis not present

## 2016-11-18 DIAGNOSIS — E785 Hyperlipidemia, unspecified: Secondary | ICD-10-CM | POA: Insufficient documentation

## 2016-11-18 DIAGNOSIS — Z888 Allergy status to other drugs, medicaments and biological substances status: Secondary | ICD-10-CM | POA: Diagnosis not present

## 2016-11-18 DIAGNOSIS — K589 Irritable bowel syndrome without diarrhea: Secondary | ICD-10-CM | POA: Diagnosis not present

## 2016-11-18 DIAGNOSIS — I10 Essential (primary) hypertension: Secondary | ICD-10-CM | POA: Insufficient documentation

## 2016-11-18 DIAGNOSIS — M1990 Unspecified osteoarthritis, unspecified site: Secondary | ICD-10-CM | POA: Diagnosis not present

## 2016-11-18 DIAGNOSIS — L97529 Non-pressure chronic ulcer of other part of left foot with unspecified severity: Secondary | ICD-10-CM | POA: Insufficient documentation

## 2016-11-18 DIAGNOSIS — Z791 Long term (current) use of non-steroidal anti-inflammatories (NSAID): Secondary | ICD-10-CM | POA: Diagnosis not present

## 2016-11-18 DIAGNOSIS — K219 Gastro-esophageal reflux disease without esophagitis: Secondary | ICD-10-CM | POA: Diagnosis not present

## 2016-11-18 DIAGNOSIS — E1140 Type 2 diabetes mellitus with diabetic neuropathy, unspecified: Secondary | ICD-10-CM | POA: Insufficient documentation

## 2016-11-18 DIAGNOSIS — Z794 Long term (current) use of insulin: Secondary | ICD-10-CM | POA: Insufficient documentation

## 2016-11-18 DIAGNOSIS — L03032 Cellulitis of left toe: Secondary | ICD-10-CM | POA: Diagnosis not present

## 2016-11-18 DIAGNOSIS — Z79899 Other long term (current) drug therapy: Secondary | ICD-10-CM | POA: Insufficient documentation

## 2016-11-18 DIAGNOSIS — G8929 Other chronic pain: Secondary | ICD-10-CM | POA: Diagnosis not present

## 2016-11-18 DIAGNOSIS — E11621 Type 2 diabetes mellitus with foot ulcer: Secondary | ICD-10-CM | POA: Diagnosis not present

## 2016-11-18 DIAGNOSIS — M869 Osteomyelitis, unspecified: Secondary | ICD-10-CM

## 2016-11-18 DIAGNOSIS — F5101 Primary insomnia: Secondary | ICD-10-CM | POA: Diagnosis not present

## 2016-11-18 HISTORY — PX: AMPUTATION: SHX166

## 2016-11-18 HISTORY — DX: Pneumonia, unspecified organism: J18.9

## 2016-11-18 LAB — CBC
HCT: 32.5 % — ABNORMAL LOW (ref 36.0–46.0)
Hemoglobin: 9.6 g/dL — ABNORMAL LOW (ref 12.0–15.0)
MCH: 22.7 pg — ABNORMAL LOW (ref 26.0–34.0)
MCHC: 29.5 g/dL — ABNORMAL LOW (ref 30.0–36.0)
MCV: 77 fL — ABNORMAL LOW (ref 78.0–100.0)
Platelets: 432 10*3/uL — ABNORMAL HIGH (ref 150–400)
RBC: 4.22 MIL/uL (ref 3.87–5.11)
RDW: 15.9 % — ABNORMAL HIGH (ref 11.5–15.5)
WBC: 10.4 10*3/uL (ref 4.0–10.5)

## 2016-11-18 LAB — BASIC METABOLIC PANEL
Anion gap: 9 (ref 5–15)
BUN: 9 mg/dL (ref 6–20)
CO2: 28 mmol/L (ref 22–32)
Calcium: 9.1 mg/dL (ref 8.9–10.3)
Chloride: 103 mmol/L (ref 101–111)
Creatinine, Ser: 0.79 mg/dL (ref 0.44–1.00)
GFR calc Af Amer: >60 mL/min (ref >60)
GFR calc non Af Amer: >60 mL/min (ref >60)
Glucose, Bld: 134 mg/dL — ABNORMAL HIGH (ref 65–99)
Potassium: 3.8 mmol/L (ref 3.5–5.1)
Sodium: 140 mmol/L (ref 135–145)

## 2016-11-18 LAB — GLUCOSE, CAPILLARY
Glucose-Capillary: 134 mg/dL — ABNORMAL HIGH (ref 65–99)
Glucose-Capillary: 121 mg/dL — ABNORMAL HIGH (ref 65–99)

## 2016-11-18 SURGERY — AMPUTATION, FOOT, RAY
Anesthesia: General | Laterality: Left

## 2016-11-18 MED ORDER — LIDOCAINE HCL (CARDIAC) 20 MG/ML IV SOLN
INTRAVENOUS | Status: DC | PRN
  Administered 2016-11-18: 20 mg via INTRAVENOUS

## 2016-11-18 MED ORDER — ONDANSETRON HCL 4 MG/2ML IJ SOLN
INTRAMUSCULAR | Status: AC
  Filled 2016-11-18: qty 2

## 2016-11-18 MED ORDER — LACTATED RINGERS IV SOLN
INTRAVENOUS | Status: DC | PRN
  Administered 2016-11-18: 07:00:00 via INTRAVENOUS

## 2016-11-18 MED ORDER — FENTANYL CITRATE (PF) 250 MCG/5ML IJ SOLN
INTRAMUSCULAR | Status: AC
  Filled 2016-11-18: qty 5

## 2016-11-18 MED ORDER — MIDAZOLAM HCL 5 MG/5ML IJ SOLN
INTRAMUSCULAR | Status: DC | PRN
  Administered 2016-11-18: 2 mg via INTRAVENOUS

## 2016-11-18 MED ORDER — MIDAZOLAM HCL 2 MG/2ML IJ SOLN
INTRAMUSCULAR | Status: AC
  Filled 2016-11-18: qty 2

## 2016-11-18 MED ORDER — FENTANYL CITRATE (PF) 100 MCG/2ML IJ SOLN: 25.0000 ug | INTRAMUSCULAR | Status: DC | PRN

## 2016-11-18 MED ORDER — DEXAMETHASONE SODIUM PHOSPHATE 4 MG/ML IJ SOLN
INTRAMUSCULAR | Status: DC | PRN
  Administered 2016-11-18: 4 mg via INTRAVENOUS

## 2016-11-18 MED ORDER — FENTANYL CITRATE (PF) 100 MCG/2ML IJ SOLN
INTRAMUSCULAR | Status: DC | PRN
  Administered 2016-11-18: 50 ug via INTRAVENOUS

## 2016-11-18 MED ORDER — LIDOCAINE 2% (20 MG/ML) 5 ML SYRINGE
INTRAMUSCULAR | Status: AC
  Filled 2016-11-18: qty 5

## 2016-11-18 MED ORDER — CHLORHEXIDINE GLUCONATE 4 % EX LIQD: 60.0000 mL | Freq: Once | CUTANEOUS | Status: DC

## 2016-11-18 MED ORDER — CEFAZOLIN SODIUM-DEXTROSE 2-4 GM/100ML-% IV SOLN
2.0000 g | INTRAVENOUS | Status: AC
  Administered 2016-11-18: 2 g via INTRAVENOUS
  Filled 2016-11-18: qty 100

## 2016-11-18 MED ORDER — PROPOFOL 10 MG/ML IV BOLUS
INTRAVENOUS | Status: DC | PRN
  Administered 2016-11-18: 170 mg via INTRAVENOUS

## 2016-11-18 MED ORDER — DEXAMETHASONE SODIUM PHOSPHATE 10 MG/ML IJ SOLN
INTRAMUSCULAR | Status: AC
  Filled 2016-11-18: qty 1

## 2016-11-18 MED ORDER — 0.9 % SODIUM CHLORIDE (POUR BTL) OPTIME
TOPICAL | Status: DC | PRN
  Administered 2016-11-18: 1000 mL

## 2016-11-18 MED ORDER — ONDANSETRON HCL 4 MG/2ML IJ SOLN
INTRAMUSCULAR | Status: DC | PRN
  Administered 2016-11-18: 4 mg via INTRAVENOUS

## 2016-11-18 MED ORDER — ONDANSETRON HCL 4 MG/2ML IJ SOLN: 4.0000 mg | Freq: Once | INTRAMUSCULAR | Status: DC | PRN

## 2016-11-18 MED ORDER — PROPOFOL 10 MG/ML IV BOLUS
INTRAVENOUS | Status: AC
  Filled 2016-11-18: qty 20

## 2016-11-18 SURGICAL SUPPLY — 28 items
BLADE SAW SGTL MED 73X18.5 STR (BLADE) IMPLANT
BLADE SURG 21 STRL SS (BLADE) ×2 IMPLANT
BNDG COHESIVE 4X5 TAN STRL (GAUZE/BANDAGES/DRESSINGS) ×2 IMPLANT
BNDG GAUZE ELAST 4 BULKY (GAUZE/BANDAGES/DRESSINGS) ×2 IMPLANT
COVER SURGICAL LIGHT HANDLE (MISCELLANEOUS) ×2 IMPLANT
DRAPE U-SHAPE 47X51 STRL (DRAPES) ×2 IMPLANT
DRSG ADAPTIC 3X8 NADH LF (GAUZE/BANDAGES/DRESSINGS) ×2 IMPLANT
DRSG PAD ABDOMINAL 8X10 ST (GAUZE/BANDAGES/DRESSINGS) IMPLANT
DURAPREP 26ML APPLICATOR (WOUND CARE) ×2 IMPLANT
ELECT REM PT RETURN 9FT ADLT (ELECTROSURGICAL) ×2
ELECTRODE REM PT RTRN 9FT ADLT (ELECTROSURGICAL) ×1 IMPLANT
GAUZE SPONGE 4X4 12PLY STRL (GAUZE/BANDAGES/DRESSINGS) ×2 IMPLANT
GAUZE SPONGE 4X4 12PLY STRL LF (GAUZE/BANDAGES/DRESSINGS) ×2 IMPLANT
GLOVE BIOGEL PI IND STRL 9 (GLOVE) ×1 IMPLANT
GLOVE BIOGEL PI INDICATOR 9 (GLOVE) ×1
GLOVE SURG ORTHO 9.0 STRL STRW (GLOVE) ×2 IMPLANT
GOWN STRL REUS W/ TWL XL LVL3 (GOWN DISPOSABLE) ×2 IMPLANT
GOWN STRL REUS W/TWL XL LVL3 (GOWN DISPOSABLE) ×2
KIT BASIN OR (CUSTOM PROCEDURE TRAY) ×2 IMPLANT
KIT ROOM TURNOVER OR (KITS) ×2 IMPLANT
NS IRRIG 1000ML POUR BTL (IV SOLUTION) ×2 IMPLANT
PACK ORTHO EXTREMITY (CUSTOM PROCEDURE TRAY) ×2 IMPLANT
PAD ABD 8X10 STRL (GAUZE/BANDAGES/DRESSINGS) ×2 IMPLANT
PAD ARMBOARD 7.5X6 YLW CONV (MISCELLANEOUS) ×4 IMPLANT
STOCKINETTE IMPERVIOUS LG (DRAPES) IMPLANT
SUT ETHILON 2 0 PSLX (SUTURE) ×2 IMPLANT
TUBE CONNECTING 12X1/4 (SUCTIONS) ×2 IMPLANT
YANKAUER SUCT BULB TIP NO VENT (SUCTIONS) ×2 IMPLANT

## 2016-11-18 NOTE — Anesthesia Preprocedure Evaluation (Addendum)
Anesthesia Evaluation  Patient identified by MRN, date of birth, ID band Patient awake    Reviewed: Allergy & Precautions, NPO status , Patient's Chart, lab work & pertinent test results, reviewed documented beta blocker date and time   History of Anesthesia Complications (+) PONV and history of anesthetic complications  Airway Mallampati: II  TM Distance: >3 FB Neck ROM: Full    Dental  (+) Teeth Intact, Dental Advisory Given   Pulmonary    breath sounds clear to auscultation       Cardiovascular hypertension, Pt. on medications and Pt. on home beta blockers  Rhythm:Regular Rate:Normal     Neuro/Psych    GI/Hepatic GERD  Medicated and Controlled,  Endo/Other  diabetes, Type 2, Insulin Dependent, Oral Hypoglycemic Agents  Renal/GU      Musculoskeletal   Abdominal   Peds  Hematology   Anesthesia Other Findings   Reproductive/Obstetrics                            Anesthesia Physical Anesthesia Plan  ASA: III  Anesthesia Plan: General   Post-op Pain Management:    Induction: Intravenous  PONV Risk Score and Plan: Ondansetron, Dexamethasone and Midazolam  Airway Management Planned: LMA  Additional Equipment:   Intra-op Plan:   Post-operative Plan:   Informed Consent: I have reviewed the patients History and Physical, chart, labs and discussed the procedure including the risks, benefits and alternatives for the proposed anesthesia with the patient or authorized representative who has indicated his/her understanding and acceptance.   Dental advisory given  Plan Discussed with: CRNA and Anesthesiologist  Anesthesia Plan Comments:        Anesthesia Quick Evaluation

## 2016-11-18 NOTE — Anesthesia Postprocedure Evaluation (Signed)
Anesthesia Post Note  [name]  Procedure(s) Performed: Left 3rd and 4th Ray Amputation (Left )     Patient location during evaluation: PACU Anesthesia Type: General Level of consciousness: awake, awake and alert and oriented Pain management: pain level controlled Vital Signs Assessment: post-procedure vital signs reviewed and stable Respiratory status: spontaneous breathing, nonlabored ventilation and respiratory function stable Cardiovascular status: blood pressure returned to baseline Anesthetic complications: no    Last Vitals:  Vitals:   11/18/16 0830 11/18/16 0845  BP: 124/65 138/72  Pulse: 81 82  Resp: 17 18  Temp:  36.6 C  SpO2: 96% 96%    Last Pain:  Vitals:   11/18/16 0845  TempSrc:   PainSc: 0-No pain                 Ellanor Feuerstein COKER

## 2016-11-18 NOTE — Progress Notes (Signed)
Orthopedic Tech Progress Note Patient Details:  Beth Holden Nov 07, 1945 [id number]  Ortho Devices Type of Ortho Device: Postop shoe/boot   Beth Holden 11/18/2016, 9:05 AM

## 2016-11-18 NOTE — Telephone Encounter (Signed)
Pt had surgery this am had to toes removed is calling to update on BS levels Sugar is 453 right now and not feeling well  What can she do to bring it down?

## 2016-11-18 NOTE — Telephone Encounter (Signed)
I contacted the patient and advised of message via voicemail. Requested a call back if the patient would like to further discuss the instructions.

## 2016-11-18 NOTE — Telephone Encounter (Signed)
Take 10 units of R insulin and check in 1 h. If not down, can take 5 more but follows the sugars until stable later this evening.

## 2016-11-18 NOTE — Anesthesia Procedure Notes (Signed)
Procedure Name: LMA Insertion Date/Time: 11/18/2016 7:37 AM Performed by: Jenne Campus, CRNA Pre-anesthesia Checklist: Patient identified, Emergency Drugs available, Suction available and Patient being monitored Patient Re-evaluated:Patient Re-evaluated prior to induction Oxygen Delivery Method: Circle System Utilized Preoxygenation: Pre-oxygenation with 100% oxygen Induction Type: IV induction Ventilation: Mask ventilation without difficulty LMA: LMA inserted LMA Size: 4.0 Number of attempts: 1 Airway Equipment and Method: Bite block Placement Confirmation: positive ETCO2 and breath sounds checked- equal and bilateral Tube secured with: Tape Dental Injury: Teeth and Oropharynx as per pre-operative assessment

## 2016-11-18 NOTE — H&P (Signed)
Beth Holden is an 71 y.o. female.   Chief Complaint: Ulceration and cellulitis left foot third and fourth toes HPI: Patient presents with acute redness swelling and drainage from the ulcer of the fourth toe left foot with redness and cellulitis of the third toe left foot.  Patient also complains of a chronic right foot fourth metatarsal head ulcer.  Past Medical History:  Diagnosis Date  . Alopecia   . Anemia, mild   . Arthritis   . Chronic cough    sees pulmonologist  . Chronic pain    chest wall and abd - s/p extensive eval  . Diabetes mellitus with neuropathy ([place])    sees endocrine  . Diverticulosis   . Fatty liver   . GERD (gastroesophageal reflux disease)    takes Nexium bid, hx erosive esophagitis  . Headache(784.0)    occasionally;r/t sinus   . History of colon polyps   . HTN (hypertension)   . Hx of amputation of lesser toe ([place])    sees podiatrist  . Hyperlipemia   . IBS (irritable bowel syndrome)   . Insomnia    takes Elavil nightly  . Joint pain   . Neuropathy   . Osteomyelitis ([place])   . Pneumonia   . PONV (postoperative nausea and vomiting)   . Seasonal allergies    takes Zyrtec daily  . Sinus tachycardia     Past Surgical History:  Procedure Laterality Date  . COLONOSCOPY    . OOPHORECTOMY  2001  . ROTATOR CUFF REPAIR Right   . TUBAL LIGATION    . VAGINAL HYSTERECTOMY  2001    Family History  Problem Relation Age of Onset  . Lung cancer Mother 31       smoked heavily  . Emphysema Mother   . Hypertension Father   . Hyperlipidemia Father   . Diabetes Father   . Coronary artery disease Father   . Dementia Father   . COPD Father        smoked  . Stomach cancer Paternal Aunt   . Brain cancer Paternal Uncle   . Irritable bowel syndrome Unknown        Several family members on fathers side   . Diabetes Other   . Stomach cancer Maternal Aunt   . Colon cancer Neg Hx    Social History:  reports that  has never smoked. she has never used  smokeless tobacco. She reports that she does not drink alcohol or use drugs.  Allergies:  Allergies  Allergen Reactions  . Codeine Other (See Comments)    Makes her crazy  . Propoxyphene Hcl Itching    Medications Prior to Admission  Medication Sig Dispense Refill  . acetaminophen (TYLENOL) 500 MG tablet Take 500 mg by mouth every 8 (eight) hours as needed for mild pain.     . cetirizine (ZYRTEC) 10 MG tablet Take 1 tablet (10 mg total) by mouth daily. 90 tablet 1  . cholecalciferol (VITAMIN D) 1000 units tablet Take 1 tablet by mouth daily.     . Fluocinolone Acetonide (DERMOTIC) 0.01 % OIL Place 1 drop in ear(s) daily as needed (itching).     . gabapentin (NEURONTIN) 100 MG capsule Take 2 tablets (600mg  total) by mouth at bedtime. (Patient taking differently: Take 200 mg by mouth at bedtime. ) 180 capsule 1  . insulin NPH Human (NOVOLIN N RELION) 100 UNIT/ML injection Inject under skin 25 units in am and 25 units at bedtime (Patient taking differently: Inject  25 Units See admin instructions into the skin. Inject under skin 25 units in am and 25 units at bedtime) 20 mL 11  . insulin regular (NOVOLIN R RELION) 100 units/mL injection INJECT 5 to 25 UNITS INTO THE SKIN THREE TIMES DAILY BEFORE MEALS 40 mL 11  . losartan (COZAAR) 25 MG tablet TAKE 1 TABLET BY MOUTH ONCE DAILY 90 tablet 1  . lovastatin (MEVACOR) 20 MG tablet Take 1 tablet (20 mg total) by mouth at bedtime. 90 tablet 1  . metFORMIN (GLUCOPHAGE) 1000 MG tablet TAKE 1 TABLET TWICE A DAY WITH A MEAL (Patient taking differently: Take 1,000 mg by mouth 2 (two) times daily with a meal. ) 180 tablet 0  . metoprolol succinate (TOPROL XL) 25 MG 24 hr tablet Take 1 tablet (25 mg total) by mouth daily. 30 tablet 11  . pantoprazole (PROTONIX) 20 MG tablet Take 20 mg 2 (two) times daily by mouth.    . senna (SENOKOT) 8.6 MG TABS tablet Take 1-2 tablets daily by mouth.    . silver sulfADIAZINE (SILVADENE) 1 % cream Apply 1 application daily  topically. Apply to foot    . vitamin B-12 (CYANOCOBALAMIN) 1000 MCG tablet Take 1,000 mcg daily by mouth.    Marland Kitchen glucose blood (ONE TOUCH ULTRA TEST) test strip USE TO TEST BLOOD SUGAR THREE TIMES DAILY AS INSTRUCTED 300 each 1  . naproxen sodium (ANAPROX) 220 MG tablet Take 220 mg by mouth daily as needed (pain).    Daryll Brod INSULIN SYRINGE 1ML/31G 31G X 5/16" 1 ML MISC USE 2 TIMES A DAY 100 each 2    Results for orders placed or performed during the hospital encounter of 11/18/16 (from the past 48 hour(s))  Glucose, capillary     Status: Abnormal   Collection Time: 11/18/16  5:56 AM  Result Value Ref Range   Glucose-Capillary 134 (H) 65 - 99 mg/dL   Xr Foot Complete Left  Result Date: 11/17/2016 3 view radiographs of the left foot shows previous second ray amputation.  There is osteomyelitis involving the proximal phalanx of the third toe and osteomyelitis involving the tuft of the fourth toe left foot.   Review of Systems  All other systems reviewed and are negative.   Blood pressure (!) 168/63, pulse 88, temperature 98.2 F (36.8 C), temperature source Oral, resp. rate 20, height 5\' 4"  (1.626 m), weight 227 lb (103 kg), SpO2 97 %. Physical Exam  Patient is alert, oriented, no adenopathy, well-dressed, normal affect, normal respiratory effort. Patient patient has a palpable dorsalis pedis pulse she has significant swelling in the left lower extremity.  There is swelling up to the ankle there is cellulitis involving the third and fourth toe.  There is a draining ulcer on the tip of the fourth toe which probes down to bone.  Examination of the right foot patient has no swelling she has a good pulse she has a chronic ulcer beneath the fourth metatarsal head.  Review of the radiographs shows destructive osteomyelitis of the proximal phalanx third toe no destructive changes of the fourth toe. Assessment/Plan Assessment: Osteomyelitis left foot third and fourth toes status post amputation of  the second toe with diabetic insensate neuropathy.  Plan: Patient states she would like to proceed with a third and fourth toe amputation.  This would leave her great toe and little toe.  Risks and benefits were discussed including risk of wound not healing need for additional surgery need for a higher level amputation.  Patient  states she understands wished to proceed at this time.  Newt Minion, MD 11/18/2016, 6:34 AM

## 2016-11-18 NOTE — Transfer of Care (Signed)
Immediate Anesthesia Transfer of Care Note  [name]  Procedure(s) Performed: Left 3rd and 4th Ray Amputation (Left )  Patient Location: PACU  Anesthesia Type:General  Level of Consciousness: awake, oriented and patient cooperative  Airway & Oxygen Therapy: Patient Spontanous Breathing and Patient connected to nasal cannula oxygen  Post-op Assessment: Report given to RN and Post -op Vital signs reviewed and stable  Post vital signs: Reviewed  Last Vitals:  Vitals:   11/18/16 0558 11/18/16 0813  BP: (!) 168/63   Pulse: 88   Resp: 20   Temp: 36.8 C 36.5 C  SpO2: 97%     Last Pain:  Vitals:   11/18/16 0614  TempSrc:   PainSc: 1       Patients Stated Pain Goal: 5 (33/58/25 1898)  Complications: No apparent anesthesia complications

## 2016-11-18 NOTE — Telephone Encounter (Signed)
See message and please advise, Thanks!  

## 2016-11-18 NOTE — Op Note (Signed)
11/18/2016  7:58 AM  PATIENT:  Beth Holden    PRE-OPERATIVE DIAGNOSIS:  Osteomyelitis Left 3rd and 4th Toes  POST-OPERATIVE DIAGNOSIS:  Same  PROCEDURE:  Left 3rd and 4th Ray Amputation  SURGEON:  Newt Minion, MD  PHYSICIAN ASSISTANT:None ANESTHESIA:   General  PREOPERATIVE INDICATIONS:  [name] is a  71 y.o. female with a diagnosis of Osteomyelitis Left 3rd and 4th Toes who failed conservative measures and elected for surgical management.    The risks benefits and alternatives were discussed with the patient preoperatively including but not limited to the risks of infection, bleeding, nerve injury, cardiopulmonary complications, the need for revision surgery, among others, and the patient was willing to proceed.  OPERATIVE IMPLANTS: none  OPERATIVE FINDINGS: good petechial bleeding  OPERATIVE PROCEDURE: Patient was brought to the operating room and underwent a general anesthetic.  After adequate levels of anesthesia were obtained patient's left lower extremity was prepped using DuraPrep draped into a sterile field a timeout was called.  A fishmouth incision was made just distal to the MTP joint of the left foot third and fourth metatarsals.  The third and fourth toes were amputated through the metatarsal shaft.  This was beveled plantarly.  There is no deep abscess at the level of the amputation.  Electrocautery was used for hemostasis.  The incision was closed using 2-0 nylon.  A sterile dressing was applied patient was extubated taken to the PACU in stable condition.

## 2016-11-19 ENCOUNTER — Encounter (HOSPITAL_COMMUNITY): Payer: Self-pay | Admitting: Orthopedic Surgery

## 2016-11-21 ENCOUNTER — Encounter (INDEPENDENT_AMBULATORY_CARE_PROVIDER_SITE_OTHER): Payer: Self-pay

## 2016-11-21 ENCOUNTER — Ambulatory Visit (INDEPENDENT_AMBULATORY_CARE_PROVIDER_SITE_OTHER): Payer: Medicare Other | Admitting: Orthopedic Surgery

## 2016-11-25 ENCOUNTER — Ambulatory Visit (INDEPENDENT_AMBULATORY_CARE_PROVIDER_SITE_OTHER): Payer: Medicare Other | Admitting: Family

## 2016-11-25 ENCOUNTER — Encounter (INDEPENDENT_AMBULATORY_CARE_PROVIDER_SITE_OTHER): Payer: Self-pay | Admitting: Family

## 2016-11-25 DIAGNOSIS — Z89422 Acquired absence of other left toe(s): Secondary | ICD-10-CM

## 2016-11-25 NOTE — Progress Notes (Signed)
Post-Op Visit Note   [name]           Date of Birth: [birth year]           MRN: [id number] Visit Date: 11/25/2016 PCP: Lucretia Kern, DO  Chief Complaint: No chief complaint on file.   HPI:  HPI Patient is a 71 year old woman seen 1 week status post left 2nd, 3rd and 4th toe amputations. Has been minimizing weight bearing in post op shoe. Surgical dressing removed today.   Ortho Exam Incision well proximally sutures healing well there is no drainage no erythema and minimal swelling no sign of infection.  Visit Diagnoses: No diagnosis found.  Plan: Begin daily Dial soap cleansing. Apply dry dressing. Continue to minimize weightbearing. Follow-up in office in 2 weeks for suture removal.  Follow-Up Instructions: No Follow-up on file.   Imaging: No results found.  Orders:  No orders of the defined types were placed in this encounter.  No orders of the defined types were placed in this encounter.    PMFS History: Patient Active Problem List   Diagnosis Date Noted  . Right foot ulcer, limited to breakdown of skin ([place]) 09/26/2016  . Osteomyelitis of toe of left foot ([place])   . Baker's cyst 07/10/2016  . Tachycardia 03/04/2016  . Onychomycosis 12/07/2015  . Non-pressure chronic ulcer of other part of right foot limited to breakdown of skin ([place]) 12/07/2015  . Upper airway cough syndrome 03/05/2014  . S/P amputation of lesser toe - followed by Dr. Sharol Given 04/15/2013  . Pain in joint, shoulder region, s/p RTC surgery - followed by Dr. Sharol Given 04/15/2013  . ANEMIA, MILD 02/04/2010  . Essential hypertension 12/05/2006  . Uncontrolled type 2 diabetes mellitus with peripheral circulatory disorder ([place]) 10/12/2006  . Hyperlipidemia 10/12/2006  . Allergic rhinitis 10/12/2006  . GERD - Followed by Dr. Fuller Plan 10/12/2006  . IRRITABLE BOWEL SYNDROME - followed by Dr. Fuller Plan 10/12/2006  . Peripheral neuropathy 10/11/2006  . ALOPECIA NEC 10/11/2006   Past Medical History:    Diagnosis Date  . Alopecia   . Anemia, mild   . Arthritis   . Chronic cough    sees pulmonologist  . Chronic pain    chest wall and abd - s/p extensive eval  . Diabetes mellitus with neuropathy ([place])    sees endocrine  . Diverticulosis   . Fatty liver   . GERD (gastroesophageal reflux disease)    takes Nexium bid, hx erosive esophagitis  . Headache(784.0)    occasionally;r/t sinus   . History of colon polyps   . HTN (hypertension)   . Hx of amputation of lesser toe ([place])    sees podiatrist  . Hyperlipemia   . IBS (irritable bowel syndrome)   . Insomnia    takes Elavil nightly  . Joint pain   . Neuropathy   . Osteomyelitis ([place])   . Pneumonia   . PONV (postoperative nausea and vomiting)   . Seasonal allergies    takes Zyrtec daily  . Sinus tachycardia     Family History  Problem Relation Age of Onset  . Lung cancer Mother 11       smoked heavily  . Emphysema Mother   . Hypertension Father   . Hyperlipidemia Father   . Diabetes Father   . Coronary artery disease Father   . Dementia Father   . COPD Father        smoked  . Stomach cancer Paternal Aunt   .  Brain cancer Paternal Uncle   . Irritable bowel syndrome Unknown        Several family members on fathers side   . Diabetes Other   . Stomach cancer Maternal Aunt   . Colon cancer Neg Hx     Past Surgical History:  Procedure Laterality Date  . AMPUTATION DIGIT Right 12/28/2011   Performed by Newt Minion, MD at [facility] N/A 01/05/2011   Performed by Pedro Earls, MD at [facility] ORS  . COLONOSCOPY    . Left 2nd Ray Amputation Left 07/15/2016   Performed by Newt Minion, MD at [facility]  . Left 3rd and 4th Ray Amputation Left 11/18/2016   Performed by Newt Minion, MD at [facility]  2001  . Right 4th Toe Amputation at Metatarsophalangeal Right 07/27/2012   Performed by Newt Minion, MD at [facility]  . Right Foot 3rd Toe Amputation Right 04/25/2012   Performed by Newt Minion, MD at [facility]  . ROTATOR CUFF REPAIR Right   . TUBAL LIGATION    . VAGINAL HYSTERECTOMY  2001   Social History   Occupational History  . Occupation: Retired   Tobacco Use  . Smoking status: Never Smoker  . Smokeless tobacco: Never Used  Substance and Sexual Activity  . Alcohol use: No  . Drug use: No  . Sexual activity: Yes    Birth control/protection: Surgical

## 2016-12-05 ENCOUNTER — Encounter (INDEPENDENT_AMBULATORY_CARE_PROVIDER_SITE_OTHER): Payer: Self-pay | Admitting: Orthopedic Surgery

## 2016-12-05 ENCOUNTER — Ambulatory Visit (INDEPENDENT_AMBULATORY_CARE_PROVIDER_SITE_OTHER): Payer: Medicare Other | Admitting: Orthopedic Surgery

## 2016-12-05 DIAGNOSIS — L97511 Non-pressure chronic ulcer of other part of right foot limited to breakdown of skin: Secondary | ICD-10-CM

## 2016-12-05 DIAGNOSIS — Z89422 Acquired absence of other left toe(s): Secondary | ICD-10-CM

## 2016-12-05 DIAGNOSIS — B351 Tinea unguium: Secondary | ICD-10-CM

## 2016-12-05 NOTE — Progress Notes (Signed)
Office Visit Note   [name]           Date of Birth: 1945/05/05           MRN: [id number] Visit Date: 12/05/2016              Requested by: Lucretia Kern, DO [address] [place], [place] [address] PCP: Lucretia Kern, DO  Chief Complaint  Patient presents with  . Left Foot - Routine Post Op    11/18/16 Left Foot 2nd, 3rd, 4th toe amputation      HPI: Patient is a 71 year old woman who presents 10 days status post amputation of lesser toes left foot she complains of persistent ulceration of the right foot and onychomycotic nails bilaterally.  Assessment & Plan: Visit Diagnoses:  1. H/O amputation of lesser toe, left ([place])   2. Onychomycosis   3. Non-pressure chronic ulcer of other part of right foot limited to breakdown of skin ([place])     Plan: Nails were trimmed x2 callus pared x1 right foot.  Sutures harvested.  She may begin lotion to the dry skin on her foot follow-up in 3 weeks  Follow-Up Instructions: Return in about 3 weeks (around 12/26/2016).   Ortho Exam  Patient is alert, oriented, no adenopathy, well-dressed, normal affect, normal respiratory effort. Examination the incision is well-healed we will harvest the sutures.  She does have thickened discolored onychomycotic nails x4 there is no signs of infection the nails were trimmed x2 without complications.  The Waggoner grade 1 ulcer on the right foot the callus was pared x1.  There is no signs of infection there is good granulation tissue at the base of the wound the wound is 2 cm in diameter 1 mm deep  Imaging: No results found. No images are attached to the encounter.  Labs: Lab Results  Component Value Date   HGBA1C 7.0 09/19/2016   HGBA1C 7.6 06/20/2016   HGBA1C 7.7 03/25/2016   LABURIC 5.5 09/01/2015   LABORGA GROUP B STREP (S.AGALACTIAE) ISOLATED 12/26/2014    @LABSALLVALUES (HGBA1)@  @BMI1 @  Orders:  No orders of the defined types were placed in this encounter.  No  orders of the defined types were placed in this encounter.    Procedures: No procedures performed  Clinical Data: No additional findings.  ROS:  All other systems negative, except as noted in the HPI. Review of Systems  Objective: Vital Signs: There were no vitals taken for this visit.  Specialty Comments:  No specialty comments available.  PMFS History: Patient Active Problem List   Diagnosis Date Noted  . Right foot ulcer, limited to breakdown of skin ([place]) 09/26/2016  . Osteomyelitis of toe of left foot ([place])   . Baker's cyst 07/10/2016  . Tachycardia 03/04/2016  . Onychomycosis 12/07/2015  . Non-pressure chronic ulcer of other part of right foot limited to breakdown of skin ([place]) 12/07/2015  . Upper airway cough syndrome 03/05/2014  . S/P amputation of lesser toe - followed by Dr. Sharol Given 04/15/2013  . Pain in joint, shoulder region, s/p RTC surgery - followed by Dr. Sharol Given 04/15/2013  . ANEMIA, MILD 02/04/2010  . Essential hypertension 12/05/2006  . Uncontrolled type 2 diabetes mellitus with peripheral circulatory disorder ([place]) 10/12/2006  . Hyperlipidemia 10/12/2006  . Allergic rhinitis 10/12/2006  . GERD - Followed by Dr. Fuller Plan 10/12/2006  . IRRITABLE BOWEL SYNDROME - followed by Dr. Fuller Plan 10/12/2006  . Peripheral neuropathy 10/11/2006  . ALOPECIA NEC 10/11/2006   Past  Medical History:  Diagnosis Date  . Alopecia   . Anemia, mild   . Arthritis   . Chronic cough    sees pulmonologist  . Chronic pain    chest wall and abd - s/p extensive eval  . Diabetes mellitus with neuropathy ([place])    sees endocrine  . Diverticulosis   . Fatty liver   . GERD (gastroesophageal reflux disease)    takes Nexium bid, hx erosive esophagitis  . Headache(784.0)    occasionally;r/t sinus   . History of colon polyps   . HTN (hypertension)   . Hx of amputation of lesser toe ([place])    sees podiatrist  . Hyperlipemia   . IBS (irritable bowel syndrome)   . Insomnia    takes  Elavil nightly  . Joint pain   . Neuropathy   . Osteomyelitis ([place])   . Pneumonia   . PONV (postoperative nausea and vomiting)   . Seasonal allergies    takes Zyrtec daily  . Sinus tachycardia     Family History  Problem Relation Age of Onset  . Lung cancer Mother 68       smoked heavily  . Emphysema Mother   . Hypertension Father   . Hyperlipidemia Father   . Diabetes Father   . Coronary artery disease Father   . Dementia Father   . COPD Father        smoked  . Stomach cancer Paternal Aunt   . Brain cancer Paternal Uncle   . Irritable bowel syndrome Unknown        Several family members on fathers side   . Diabetes Other   . Stomach cancer Maternal Aunt   . Colon cancer Neg Hx     Past Surgical History:  Procedure Laterality Date  . AMPUTATION  12/28/2011   Procedure: AMPUTATION DIGIT;  Surgeon: Newt Minion, MD;  Location: [facility];  Service: Orthopedics;  Laterality: Right;  Right Foot 2nd Toe Amputation at MTP (metatarsophalangeal joint)  . AMPUTATION Right 04/25/2012   Procedure: Right Foot 3rd Toe Amputation;  Surgeon: Newt Minion, MD;  Location: [facility];  Service: Orthopedics;  Laterality: Right;  Right Foot Third Toe Amputation   . AMPUTATION Right 07/27/2012   Procedure: Right 4th Toe Amputation at Metatarsophalangeal;  Surgeon: Newt Minion, MD;  Location: [facility];  Service: Orthopedics;  Laterality: Right;  Right 4th Toe Amputation at Metatarsophalangeal  . AMPUTATION Left 07/15/2016   Procedure: Left 2nd Ray Amputation;  Surgeon: Newt Minion, MD;  Location: [facility];  Service: Orthopedics;  Laterality: Left;  . AMPUTATION Left 11/18/2016   Procedure: Left 3rd and 4th Ray Amputation;  Surgeon: Newt Minion, MD;  Location: [facility];  Service: Orthopedics;  Laterality: Left;  . COLONOSCOPY    . LAPAROSCOPIC APPENDECTOMY  01/05/2011   Procedure: APPENDECTOMY LAPAROSCOPIC;  Surgeon: Pedro Earls, MD;  Location: [facility];  Service: General;  Laterality: N/A;  .  OOPHORECTOMY  2001  . ROTATOR CUFF REPAIR Right   . TUBAL LIGATION    . VAGINAL HYSTERECTOMY  2001   Social History   Occupational History  . Occupation: Retired   Tobacco Use  . Smoking status: Never Smoker  . Smokeless tobacco: Never Used  Substance and Sexual Activity  . Alcohol use: No  . Drug use: No  . Sexual activity: Yes    Birth control/protection: Surgical

## 2016-12-08 ENCOUNTER — Other Ambulatory Visit: Payer: Self-pay | Admitting: Gastroenterology

## 2016-12-11 DIAGNOSIS — J029 Acute pharyngitis, unspecified: Secondary | ICD-10-CM | POA: Diagnosis not present

## 2016-12-11 DIAGNOSIS — J018 Other acute sinusitis: Secondary | ICD-10-CM | POA: Diagnosis not present

## 2016-12-12 ENCOUNTER — Telehealth: Payer: Self-pay | Admitting: Gastroenterology

## 2016-12-12 ENCOUNTER — Encounter: Payer: Self-pay | Admitting: Family Medicine

## 2016-12-12 NOTE — Telephone Encounter (Signed)
Left a message for patient to return my call. 

## 2016-12-14 ENCOUNTER — Other Ambulatory Visit: Payer: Self-pay | Admitting: Internal Medicine

## 2016-12-14 MED ORDER — PANTOPRAZOLE SODIUM 20 MG PO TBEC: 40.0000 mg | DELAYED_RELEASE_TABLET | Freq: Two times a day (BID) | ORAL | 1 refills | Status: DC

## 2016-12-14 NOTE — Telephone Encounter (Signed)
Left a message for patient to return my call. 

## 2016-12-14 NOTE — Telephone Encounter (Signed)
Patient states she needs refills sent to [facility] until she can figure out what mail order pharmacy she needs. Informed patient she needs to schedule follow up visit. Patien schedueld for 02/06/17 at 9:15am. Refills sent to [facility].

## 2016-12-21 MED ORDER — OMEPRAZOLE 20 MG PO CPDR: 40.0000 mg | DELAYED_RELEASE_CAPSULE | Freq: Two times a day (BID) | ORAL | 3 refills | Status: DC

## 2016-12-21 NOTE — Telephone Encounter (Signed)
Apologized and asked if I can give patient samples of Nexium until she can refill her next prescription of omeprazole. Patient states that is fine and also would like a new 90 day rx printed for her new mail order pharmacy left with the samples. RX printed and samples of Nexium left up front.

## 2016-12-21 NOTE — Telephone Encounter (Signed)
Patient called in stating that she was prescribed pantoprazole instead of omeprazole and has been taking 2 pills daily instead of one and is now having an upset stomach. Best # [phone]

## 2016-12-21 NOTE — Addendum Note (Signed)
Addended by: Marzella Schlein on: 12/21/2016 02:29 PM   Modules accepted: Orders

## 2016-12-22 ENCOUNTER — Telehealth: Payer: Self-pay | Admitting: Family Medicine

## 2016-12-22 NOTE — Telephone Encounter (Signed)
Copied from [employer]. Topic: Quick Communication - Office Called Patient >> Dec 22, 2016  2:09 PM [place], [place] wrote: Reason for CRM:  Called to see if she wanted to schedule AWV.  Pt will be in the office tomorrow ( 12/14 ) - told her we could do it after she sees Dr Maudie Mercury ( 4 oclock time slot ).  If she cant do it tomorrow, pt can schedule for any day except Wed.  >> Dec 22, 2016  2:33 PM Wynetta Emery, [place] C wrote: Pt called back in, she said that she would like to decline the AWV at this time.

## 2016-12-22 NOTE — Telephone Encounter (Signed)
Lmom - wanted to see if pt wanted to schedule her AWV for tomorrow after she sees Dr Maudie Mercury.  CRM created.

## 2016-12-23 ENCOUNTER — Encounter: Payer: Self-pay | Admitting: Family Medicine

## 2016-12-23 ENCOUNTER — Ambulatory Visit (INDEPENDENT_AMBULATORY_CARE_PROVIDER_SITE_OTHER): Payer: Medicare Other | Admitting: Family Medicine

## 2016-12-23 VITALS — BP 120/60 | HR 88 | Temp 98.1°F | Ht 64.0 in | Wt 231.8 lb

## 2016-12-23 DIAGNOSIS — E1165 Type 2 diabetes mellitus with hyperglycemia: Secondary | ICD-10-CM | POA: Diagnosis not present

## 2016-12-23 DIAGNOSIS — E1169 Type 2 diabetes mellitus with other specified complication: Secondary | ICD-10-CM

## 2016-12-23 DIAGNOSIS — E1151 Type 2 diabetes mellitus with diabetic peripheral angiopathy without gangrene: Secondary | ICD-10-CM

## 2016-12-23 DIAGNOSIS — N760 Acute vaginitis: Secondary | ICD-10-CM | POA: Diagnosis not present

## 2016-12-23 DIAGNOSIS — I152 Hypertension secondary to endocrine disorders: Secondary | ICD-10-CM

## 2016-12-23 DIAGNOSIS — D649 Anemia, unspecified: Secondary | ICD-10-CM | POA: Diagnosis not present

## 2016-12-23 DIAGNOSIS — Z0000 Encounter for general adult medical examination without abnormal findings: Secondary | ICD-10-CM | POA: Diagnosis not present

## 2016-12-23 DIAGNOSIS — E785 Hyperlipidemia, unspecified: Secondary | ICD-10-CM

## 2016-12-23 DIAGNOSIS — I10 Essential (primary) hypertension: Secondary | ICD-10-CM | POA: Diagnosis not present

## 2016-12-23 DIAGNOSIS — E1159 Type 2 diabetes mellitus with other circulatory complications: Secondary | ICD-10-CM

## 2016-12-23 DIAGNOSIS — IMO0002 Reserved for concepts with insufficient information to code with codable children: Secondary | ICD-10-CM

## 2016-12-23 LAB — CBC WITH DIFFERENTIAL/PLATELET
Basophils Absolute: 69 cells/uL (ref 0–200)
Basophils Relative: 0.5 %
Eosinophils Absolute: 262 cells/uL (ref 15–500)
Eosinophils Relative: 1.9 %
HCT: 31.2 % — ABNORMAL LOW (ref 35.0–45.0)
Hemoglobin: 9.8 g/dL — ABNORMAL LOW (ref 11.7–15.5)
Lymphs Abs: 3933 cells/uL — ABNORMAL HIGH (ref 850–3900)
MCH: 22.8 pg — ABNORMAL LOW (ref 27.0–33.0)
MCHC: 31.4 g/dL — ABNORMAL LOW (ref 32.0–36.0)
MCV: 72.7 fL — ABNORMAL LOW (ref 80.0–100.0)
MPV: 9.6 fL (ref 7.5–12.5)
Monocytes Relative: 5.1 %
Neutro Abs: 8832 cells/uL — ABNORMAL HIGH (ref 1500–7800)
Neutrophils Relative %: 64 %
Platelets: 467 10*3/uL — ABNORMAL HIGH (ref 140–400)
RBC: 4.29 10*6/uL (ref 3.80–5.10)
RDW: 15.1 % — ABNORMAL HIGH (ref 11.0–15.0)
Total Lymphocyte: 28.5 %
WBC mixed population: 704 cells/uL (ref 200–950)
WBC: 13.8 10*3/uL — ABNORMAL HIGH (ref 3.8–10.8)

## 2016-12-23 MED ORDER — FLUCONAZOLE 150 MG PO TABS: 150.0000 mg | ORAL_TABLET | Freq: Once | ORAL | 0 refills | Status: AC

## 2016-12-23 NOTE — Progress Notes (Signed)
HPI:  Beth Holden is a pleasant 71 y.o. here for follow up. Chronic medical problems summarized below were reviewed for changes.  Doing well for the most part.  She reports she did have a sore throat, congestion and ear pain and [facility].  She was given amoxicillin is doing better.  She still has a little discomfort in the right ear and wants me to check this today.  She got a yeast infection from the antibiotic and the over-the-counter treatment did not work.  She does request that I send in some Diflucan.  She had to have several more toes amputated back in November.  Denies any weakness, numbness, skin problems currently, fevers, malaise, melena or hematochezia.  She sees Dr. Fuller Plan for GERD and IBS and had a little bit of stomach issues last week with a change in her PPI - now back on her old PPI and doing better. Denies CP, SOB, DOE, treatment intolerance or new symptoms. Labs, anemia panel, stool cards -due for annual wellness visit Will see Manuela Schwartz today for AWV.  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  Past Medical History:  Diagnosis Date  . Alopecia   . Anemia, mild   . Arthritis   . Chronic cough    sees pulmonologist  . Chronic pain    chest wall and abd - s/p extensive eval  . Diabetes mellitus with neuropathy ([place])    sees endocrine  . Diverticulosis   . Fatty liver   . GERD (gastroesophageal reflux disease)    takes Nexium bid, hx  erosive esophagitis  . Headache(784.0)    occasionally;r/t sinus   . History of colon polyps   . HTN (hypertension)   . Hx of amputation of lesser toe ([place])    sees podiatrist  . Hyperlipemia   . IBS (irritable bowel syndrome)   . Insomnia    takes Elavil nightly  . Joint pain   . Neuropathy   . Osteomyelitis ([place])   . Pneumonia   . PONV (postoperative nausea and vomiting)   . Seasonal allergies    takes Zyrtec daily  . Sinus tachycardia     Past Surgical History:  Procedure Laterality Date  . AMPUTATION  12/28/2011   Procedure: AMPUTATION DIGIT;  Surgeon: Newt Minion, MD;  Location: [facility];  Service: Orthopedics;  Laterality: Right;  Right Foot 2nd Toe Amputation at MTP (metatarsophalangeal joint)  . AMPUTATION Right 04/25/2012   Procedure: Right Foot 3rd Toe Amputation;  Surgeon: Newt Minion, MD;  Location: [facility];  Service: Orthopedics;  Laterality: Right;  Right Foot Third Toe Amputation   . AMPUTATION Right 07/27/2012   Procedure: Right 4th Toe Amputation at Metatarsophalangeal;  Surgeon: Newt Minion, MD;  Location: [facility];  Service: Orthopedics;  Laterality: Right;  Right 4th Toe Amputation at Metatarsophalangeal  . AMPUTATION Left 07/15/2016   Procedure: Left 2nd Ray Amputation;  Surgeon: Newt Minion, MD;  Location: [facility];  Service: Orthopedics;  Laterality: Left;  . AMPUTATION Left 11/18/2016   Procedure: Left 3rd and 4th Ray Amputation;  Surgeon: Newt Minion, MD;  Location: [facility];  Service: Orthopedics;  Laterality: Left;  . COLONOSCOPY    . LAPAROSCOPIC APPENDECTOMY  01/05/2011   Procedure: APPENDECTOMY LAPAROSCOPIC;  Surgeon: Pedro Earls, MD;  Location: [facility];  Service: General;  Laterality: N/A;  . OOPHORECTOMY  2001  . ROTATOR CUFF REPAIR Right   . TUBAL LIGATION    . VAGINAL HYSTERECTOMY  2001    Family History  Problem Relation Age of Onset  . Lung cancer Mother 73       smoked heavily  . Emphysema Mother   . Hypertension Father   .  Hyperlipidemia Father   . Diabetes Father   . Coronary artery disease Father   . Dementia Father   . COPD Father        smoked  . Stomach cancer Paternal Aunt   . Brain cancer Paternal Uncle   . Irritable bowel syndrome Unknown        Several family members on fathers side   . Diabetes Other   . Stomach cancer Maternal Aunt   . Colon cancer Neg Hx     Social History   Socioeconomic History  . Marital status: Married    Spouse name: None  . Number of children: 2  . Years of education: None  . Highest education level: None  Social Needs  . Financial resource strain: None  . Food insecurity - worry: None  . Food insecurity - inability: None  . Transportation needs - medical: None  . Transportation needs - non-medical: None  Occupational History  . Occupation: Retired   Tobacco Use  . Smoking status: Never Smoker  . Smokeless tobacco: Never Used  Substance and Sexual Activity  . Alcohol use: No  . Drug use: No  . Sexual activity: Yes    Birth control/protection: Surgical  Other Topics Concern  . None  Social History Narrative   Caffeine daily    [employer], [employer] no diploma   Married '66   1 dtr- '78; 1 son '71; 2 grandchildren   Occupation: retired 04   Dad with alzheimers-had to place in [place] (summer '10)           Current Outpatient Medications:  .  acetaminophen (TYLENOL) 500 MG tablet, Take 500 mg by mouth every 8 (eight) hours as needed for mild pain. , Disp: , Rfl:  .  cetirizine (ZYRTEC) 10 MG tablet, Take 1 tablet (10 mg total) by mouth daily., Disp: 90 tablet, Rfl: 1 .  cholecalciferol (VITAMIN D) 1000 units tablet, Take 1 tablet by mouth daily. , Disp: , Rfl:  .  Fluocinolone Acetonide (DERMOTIC) 0.01 % OIL, Place 1 drop in ear(s) daily as needed (itching). , Disp: , Rfl:  .  gabapentin (NEURONTIN) 100 MG capsule, TAKE 2 CAPSULES BY MOUTH AT BEDTIME, Disp: 180 capsule, Rfl: 1 .  glucose blood (ONE TOUCH ULTRA TEST) test strip, USE TO TEST BLOOD SUGAR THREE  TIMES DAILY AS INSTRUCTED, Disp: 300 each, Rfl: 1 .  insulin NPH Human (NOVOLIN N RELION) 100 UNIT/ML injection, Inject under skin 25 units in am and 25 units at bedtime, Disp: 20 mL, Rfl: 11 .  insulin regular (NOVOLIN R RELION) 100 units/mL injection, INJECT 5 to 25 UNITS INTO THE SKIN THREE TIMES DAILY BEFORE MEALS, Disp: 40 mL, Rfl: 11 .  losartan (COZAAR) 25 MG tablet, TAKE 1 TABLET BY MOUTH ONCE DAILY, Disp: 90 tablet, Rfl: 1 .  lovastatin (MEVACOR) 20 MG tablet, Take  1 tablet (20 mg total) by mouth at bedtime., Disp: 90 tablet, Rfl: 1 .  metFORMIN (GLUCOPHAGE) 1000 MG tablet, TAKE 1 TABLET TWICE A DAY WITH A MEAL, Disp: 180 tablet, Rfl: 0 .  metoprolol succinate (TOPROL XL) 25 MG 24 hr tablet, Take 1 tablet (25 mg total) by mouth daily., Disp: 30 tablet, Rfl: 11 .  naproxen sodium (ANAPROX) 220 MG tablet, Take 220 mg by mouth daily as needed (pain)., Disp: , Rfl:  .  omeprazole (PRILOSEC) 20 MG capsule, Take 2 capsules (40 mg total) by mouth 2 (two) times daily before a meal., Disp: 360 capsule, Rfl: 3 .  RELION INSULIN SYRINGE 1ML/31G 31G X 5/16" 1 ML MISC, USE 2 TIMES A DAY, Disp: 100 each, Rfl: 2 .  senna (SENOKOT) 8.6 MG TABS tablet, Take 1-2 tablets daily by mouth., Disp: , Rfl:  .  silver sulfADIAZINE (SILVADENE) 1 % cream, Apply 1 application daily topically. Apply to foot, Disp: , Rfl:  .  vitamin B-12 (CYANOCOBALAMIN) 1000 MCG tablet, Take 1,000 mcg daily by mouth., Disp: , Rfl:   EXAM:  Vitals:   12/23/16 1535  BP: 120/60  Pulse: 88  Temp: 98.1 F (36.7 C)    Body mass index is 39.79 kg/m.  GENERAL: vitals reviewed and listed above, alert, oriented, appears well hydrated and in no acute distress  HEENT: atraumatic, conjunttiva clear, no obvious abnormalities on inspection of external nose and ears  NECK: no obvious masses on inspection  LUNGS: clear to auscultation bilaterally, no wheezes, rales or rhonchi, good air movement  CV: HRRR, no peripheral edema  MS:  moves all extremities without noticeable abnormality  PSYCH: pleasant and cooperative, no obvious depression or anxiety  ASSESSMENT AND PLAN:  Discussed the following assessment and plan:  Acute vaginitis -diflucan once  Anemia, unspecified type  - Plan: CBC -stool cards  Uncontrolled type 2 diabetes mellitus with peripheral circulatory disorder (HCC) -sees endo for management, lifestyle recs  Hypertension associated with diabetes ([place]) -cont current tx, lifestyle recs  Hyperlipidemia associated with type 2 diabetes mellitus ([place]) Morbid obesity ([place]) -lifestyle recs, not fasting, fasting lipid next appt  Medicare annual wellness visit, subsequent -see notes by Epifania Gore  -Patient advised to return or notify a doctor immediately if symptoms worsen or persist or new concerns arise.  Patient Instructions   BEFORE YOU LEAVE: -labs -AWV with susan -stool cards for anemia -follow up: 3-4 months - come fasting so can check fasting cholesterol  Schedule follow up with your diabetes doctor  We have ordered labs or studies at this visit. It can take up to 1-2 weeks for results and processing. IF results require follow up or explanation, we will call you with instructions. Clinically stable results will be released to your [facility]. If you have not heard from [place] or cannot find your results in [facility] in 2 weeks please contact our office at [phone].  If you are not yet signed up for [facility] please consider signing up.   We recommend the following healthy lifestyle for LIFE: 1) Small portions. But, make sure to get regular (at least 3 per day), healthy meals and small healthy snacks if needed.  2) Eat a healthy clean diet.   TRY TO EAT: -at least 5-7 servings of low sugar, colorful, and nutrient rich vegetables per day (not corn, potatoes or bananas.) -berries are the best choice if you wish to eat fruit (only eat small amounts if trying to reduce weight)  -lean meets  (  fish, white meat of chicken or [place]) -vegan proteins for some meals - beans or tofu, whole grains, nuts and seeds -Replace bad fats with good fats - good fats include: fish, nuts and seeds, canola oil, olive oil -small amounts of low fat or non fat dairy -small amounts of100 % whole grains - check the lables -drink plenty of water  AVOID: -SUGAR, sweets, anything with added sugar, corn syrup or sweeteners - must read labels as even foods advertised as "healthy" often are loaded with sugar -if you must have a sweetener, small amounts of stevia may be best -sweetened beverages and artificially sweetened beverages -simple starches (rice, bread, potatoes, pasta, chips, etc - small amounts of 100% whole grains are ok) -red meat, pork, butter -fried foods, fast food, processed food, excessive dairy, eggs and coconut.  3)Get at least 150 minutes of sweaty aerobic exercise per week.  4)Reduce stress - consider counseling, meditation and relaxation to balance other aspects of your life.     Ms. Nichelson , Thank you for taking time to come for your Medicare Wellness Visit. I appreciate your ongoing commitment to your health goals. Please review the following plan we discussed and let me know if I can assist you in the future.   Will have a mammogram in December  Shingrix is a vaccine for the prevention of Shingles in Adults 50 and older.  If you are on Medicare, you can request a prescription from your doctor to be filled at a pharmacy.  Please check with your benefits regarding applicable copays or out of pocket expenses.  The Shingrix is given in 2 vaccines approx 8 weeks apart. You must receive the 2nd dose prior to 6 months from receipt of the first.    Can have a hearing test and medicare does pay Deaf & Hard of Hearing Division Services - can assist with hearing aid x 1  No reviews  [facility]  [phone]  [phone]  [email or web address]-[email or web address]_How_to_Buy_Hearing_Aids.pdf    These are the goals we discussed: Goals    . Exercise 150 min/wk Moderate Activity     Will start with home plan when the doctor tells you your foot is healed;        This is a list of the screening recommended for you and due dates:  Health Maintenance  Topic Date Due  . Hemoglobin A1C  03/19/2017  . Eye exam for diabetics  04/11/2017  . Complete foot exam   12/23/2017  . Mammogram  12/24/2017  . DEXA scan (bone density measurement)  12/24/2017  . Colon Cancer Screening  12/31/2017  . Tetanus Vaccine  02/12/2020  . Flu Shot  Completed  .  Hepatitis C: One time screening is recommended by Center for Disease Control  (CDC) for  adults born from [birth year] through 1963.   Completed  . Pneumonia vaccines  Completed   Prevention of falls: Remove rugs or any tripping hazards in the home Use Non slip mats in bathtubs and showers Placing grab bars next to the toilet and or shower Placing handrails on both sides of the stair way Adding extra lighting in the home.   Personal safety issues reviewed:  1. Consider starting a community watch program per [facility] 2.  Changes batteries is smoke detector and/or carbon monoxide detector  3.  If you have firearms; keep them in a safe place 4.  Wear protection when in the sun; Always wear sunscreen or a  hat; It is good to have your doctor check your skin annually or review any new areas of concern 5. Driving safety; Keep in the right lane; stay 3 car lengths behind the car in front of you on the highway; look 3 times prior to pulling out; carry your cell phone everywhere you go!    Learn about the Yellow Dot program:  The program allows first responders at your emergency to have access to who your physician is, as well as your medications and medical conditions.  Citizens requesting the Yellow Dot Packages  should contact Master Corporal Nunzio Cobbs at the [facility] [phone] for the first week of the program and beginning the week after Easter citizens should contact their Scientist, physiological.      Fall Prevention in the Home Falls can cause injuries and can affect people from all age groups. There are many simple things that you can do to make your home safe and to help prevent falls. What can I do on the outside of my home?  Regularly repair the edges of walkways and driveways and fix any cracks.  Remove high doorway thresholds.  Trim any shrubbery on the main path into your home.  Use bright outdoor lighting.  Clear walkways of debris and clutter, including tools and rocks.  Regularly check that handrails are securely fastened and in good repair. Both sides of any steps should have handrails.  Install guardrails along the edges of any raised decks or porches.  Have leaves, snow, and ice cleared regularly.  Use sand or salt on walkways during winter months.  In the garage, clean up any spills right away, including grease or oil spills. What can I do in the bathroom?  Use night lights.  Install grab bars by the toilet and in the tub and shower. Do not use towel bars as grab bars.  Use non-skid mats or decals on the floor of the tub or shower.  If you need to sit down while you are in the shower, use a plastic, non-slip stool.  Keep the floor dry. Immediately clean up any water that spills on the floor.  Remove soap buildup in the tub or shower on a regular basis.  Attach bath mats securely with double-sided non-slip rug tape.  Remove throw rugs and other tripping hazards from the floor. What can I do in the bedroom?  Use night lights.  Make sure that a bedside light is easy to reach.  Do not use oversized bedding that drapes onto the floor.  Have a firm chair that has side arms to use for getting dressed.  Remove throw rugs  and other tripping hazards from the floor. What can I do in the kitchen?  Clean up any spills right away.  Avoid walking on wet floors.  Place frequently used items in easy-to-reach places.  If you need to reach for something above you, use a sturdy step stool that has a grab bar.  Keep electrical cables out of the way.  Do not use floor polish or wax that makes floors slippery. If you have to use wax, make sure that it is non-skid floor wax.  Remove throw rugs and other tripping hazards from the floor. What can I do in the stairways?  Do not leave any items on the stairs.  Make sure that there are handrails on both sides of the stairs. Fix handrails that are broken or loose. Make sure that handrails are as long  as the stairways.  Check any carpeting to make sure that it is firmly attached to the stairs. Fix any carpet that is loose or worn.  Avoid having throw rugs at the top or bottom of stairways, or secure the rugs with carpet tape to prevent them from moving.  Make sure that you have a light switch at the top of the stairs and the bottom of the stairs. If you do not have them, have them installed. What are some other fall prevention tips?  Wear closed-toe shoes that fit well and support your feet. Wear shoes that have rubber soles or low heels.  When you use a stepladder, make sure that it is completely opened and that the sides are firmly locked. Have someone hold the ladder while you are using it. Do not climb a closed stepladder.  Add color or contrast paint or tape to grab bars and handrails in your home. Place contrasting color strips on the first and last steps.  Use mobility aids as needed, such as canes, walkers, scooters, and crutches.  Turn on lights if it is dark. Replace any light bulbs that burn out.  Set up furniture so that there are clear paths. Keep the furniture in the same spot.  Fix any uneven floor surfaces.  Choose a carpet design that does not  hide the edge of steps of a stairway.  Be aware of any and all pets.  Review your medicines with your healthcare provider. Some medicines can cause dizziness or changes in blood pressure, which increase your risk of falling. Talk with your health care provider about other ways that you can decrease your risk of falls. This may include working with a physical therapist or trainer to improve your strength, balance, and endurance. This information is not intended to replace advice given to you by your health care provider. Make sure you discuss any questions you have with your health care provider. Document Released: 12/17/2001 Document Revised: 05/26/2015 Document Reviewed: 01/31/2014 Elsevier Interactive Patient Education  2017 Platea Maintenance for Postmenopausal Women Menopause is a normal process in which your reproductive ability comes to an end. This process happens gradually over a span of months to years, usually between the ages of 77 and 76. Menopause is complete when you have missed 12 consecutive menstrual periods. It is important to talk with your health care provider about some of the most common conditions that affect postmenopausal women, such as heart disease, cancer, and bone loss (osteoporosis). Adopting a healthy lifestyle and getting preventive care can help to promote your health and wellness. Those actions can also lower your chances of developing some of these common conditions. What should I know about menopause? During menopause, you may experience a number of symptoms, such as:  Moderate-to-severe hot flashes.  Night sweats.  Decrease in sex drive.  Mood swings.  Headaches.  Tiredness.  Irritability.  Memory problems.  Insomnia.  Choosing to treat or not to treat menopausal changes is an individual decision that you make with your health care provider. What should I know about hormone replacement therapy and supplements? Hormone therapy  products are effective for treating symptoms that are associated with menopause, such as hot flashes and night sweats. Hormone replacement carries certain risks, especially as you become older. If you are thinking about using estrogen or estrogen with progestin treatments, discuss the benefits and risks with your health care provider. What should I know about heart disease and stroke? Heart disease, heart attack,  and stroke become more likely as you age. This may be due, in part, to the hormonal changes that your body experiences during menopause. These can affect how your body processes dietary fats, triglycerides, and cholesterol. Heart attack and stroke are both medical emergencies. There are many things that you can do to help prevent heart disease and stroke:  Have your blood pressure checked at least every 1-2 years. High blood pressure causes heart disease and increases the risk of stroke.  If you are 7-31 years old, ask your health care provider if you should take aspirin to prevent a heart attack or a stroke.  Do not use any tobacco products, including cigarettes, chewing tobacco, or electronic cigarettes. If you need help quitting, ask your health care provider.  It is important to eat a healthy diet and maintain a healthy weight. ? Be sure to include plenty of vegetables, fruits, low-fat dairy products, and lean protein. ? Avoid eating foods that are high in solid fats, added sugars, or salt (sodium).  Get regular exercise. This is one of the most important things that you can do for your health. ? Try to exercise for at least 150 minutes each week. The type of exercise that you do should increase your heart rate and make you sweat. This is known as moderate-intensity exercise. ? Try to do strengthening exercises at least twice each week. Do these in addition to the moderate-intensity exercise.  Know your numbers.Ask your health care provider to check your cholesterol and your blood  glucose. Continue to have your blood tested as directed by your health care provider.  What should I know about cancer screening? There are several types of cancer. Take the following steps to reduce your risk and to catch any cancer development as early as possible. Breast Cancer  Practice breast self-awareness. ? This means understanding how your breasts normally appear and feel. ? It also means doing regular breast self-exams. Let your health care provider know about any changes, no matter how small.  If you are 85 or older, have a clinician do a breast exam (clinical breast exam or CBE) every year. Depending on your age, family history, and medical history, it may be recommended that you also have a yearly breast X-ray (mammogram).  If you have a family history of breast cancer, talk with your health care provider about genetic screening.  If you are at high risk for breast cancer, talk with your health care provider about having an MRI and a mammogram every year.  Breast cancer (BRCA) gene test is recommended for women who have family members with BRCA-related cancers. Results of the assessment will determine the need for genetic counseling and BRCA1 and for BRCA2 testing. BRCA-related cancers include these types: ? Breast. This occurs in males or females. ? Ovarian. ? Tubal. This may also be called fallopian tube cancer. ? Cancer of the abdominal or pelvic lining (peritoneal cancer). ? Prostate. ? Pancreatic.  Cervical, Uterine, and Ovarian Cancer Your health care provider may recommend that you be screened regularly for cancer of the pelvic organs. These include your ovaries, uterus, and vagina. This screening involves a pelvic exam, which includes checking for microscopic changes to the surface of your cervix (Pap test).  For women ages 21-65, health care providers may recommend a pelvic exam and a Pap test every three years. For women ages 3-65, they may recommend the Pap test  and pelvic exam, combined with testing for human papilloma virus (HPV), every five  years. Some types of HPV increase your risk of cervical cancer. Testing for HPV may also be done on women of any age who have unclear Pap test results.  Other health care providers may not recommend any screening for nonpregnant women who are considered low risk for pelvic cancer and have no symptoms. Ask your health care provider if a screening pelvic exam is right for you.  If you have had past treatment for cervical cancer or a condition that could lead to cancer, you need Pap tests and screening for cancer for at least 20 years after your treatment. If Pap tests have been discontinued for you, your risk factors (such as having a new sexual partner) need to be reassessed to determine if you should start having screenings again. Some women have medical problems that increase the chance of getting cervical cancer. In these cases, your health care provider may recommend that you have screening and Pap tests more often.  If you have a family history of uterine cancer or ovarian cancer, talk with your health care provider about genetic screening.  If you have vaginal bleeding after reaching menopause, tell your health care provider.  There are currently no reliable tests available to screen for ovarian cancer.  Lung Cancer Lung cancer screening is recommended for adults 29-89 years old who are at high risk for lung cancer because of a history of smoking. A yearly low-dose CT scan of the lungs is recommended if you:  Currently smoke.  Have a history of at least 30 pack-years of smoking and you currently smoke or have quit within the past 15 years. A pack-year is smoking an average of one pack of cigarettes per day for one year.  Yearly screening should:  Continue until it has been 15 years since you quit.  Stop if you develop a health problem that would prevent you from having lung cancer treatment.  Colorectal  Cancer  This type of cancer can be detected and can often be prevented.  Routine colorectal cancer screening usually begins at age 11 and continues through age 26.  If you have risk factors for colon cancer, your health care provider may recommend that you be screened at an earlier age.  If you have a family history of colorectal cancer, talk with your health care provider about genetic screening.  Your health care provider may also recommend using home test kits to check for hidden blood in your stool.  A small camera at the end of a tube can be used to examine your colon directly (sigmoidoscopy or colonoscopy). This is done to check for the earliest forms of colorectal cancer.  Direct examination of the colon should be repeated every 5-10 years until age 62. However, if early forms of precancerous polyps or small growths are found or if you have a family history or genetic risk for colorectal cancer, you may need to be screened more often.  Skin Cancer  Check your skin from head to toe regularly.  Monitor any moles. Be sure to tell your health care provider: ? About any new moles or changes in moles, especially if there is a change in a mole's shape or color. ? If you have a mole that is larger than the size of a pencil eraser.  If any of your family members has a history of skin cancer, especially at a young age, talk with your health care provider about genetic screening.  Always use sunscreen. Apply sunscreen liberally and repeatedly throughout the  day.  Whenever you are outside, protect yourself by wearing long sleeves, pants, a wide-brimmed hat, and sunglasses.  What should I know about osteoporosis? Osteoporosis is a condition in which bone destruction happens more quickly than new bone creation. After menopause, you may be at an increased risk for osteoporosis. To help prevent osteoporosis or the bone fractures that can happen because of osteoporosis, the following is  recommended:  If you are 23-79 years old, get at least 1,000 mg of calcium and at least 600 mg of vitamin D per day.  If you are older than age 23 but younger than age 10, get at least 1,200 mg of calcium and at least 600 mg of vitamin D per day.  If you are older than age 75, get at least 1,200 mg of calcium and at least 800 mg of vitamin D per day.  Smoking and excessive alcohol intake increase the risk of osteoporosis. Eat foods that are rich in calcium and vitamin D, and do weight-bearing exercises several times each week as directed by your health care provider. What should I know about how menopause affects my mental health? Depression may occur at any age, but it is more common as you become older. Common symptoms of depression include:  Low or sad mood.  Changes in sleep patterns.  Changes in appetite or eating patterns.  Feeling an overall lack of motivation or enjoyment of activities that you previously enjoyed.  Frequent crying spells.  Talk with your health care provider if you think that you are experiencing depression. What should I know about immunizations? It is important that you get and maintain your immunizations. These include:  Tetanus, diphtheria, and pertussis (Tdap) booster vaccine.  Influenza every year before the flu season begins.  Pneumonia vaccine.  Shingles vaccine.  Your health care provider may also recommend other immunizations. This information is not intended to replace advice given to you by your health care provider. Make sure you discuss any questions you have with your health care provider. Document Released: 02/18/2005 Document Revised: 07/17/2015 Document Reviewed: 09/30/2014 Elsevier Interactive Patient Education  2018 [place] DO

## 2016-12-23 NOTE — Progress Notes (Signed)
Subjective:   Beth Holden is a 71 y.o. female who presents for Medicare Annual (Subsequent) preventive examination.  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 Meridee Score  A1c 7;  Follows recommendation from dr. Cruzita Lederer  Sees her q 3 months  Is due to have A1 c on Monday Test BS 3 to 4 times a day; does have sliding scale;  25 novolin 25 u am and pm  Diet Eats a late breakfast and does not eat lunch Eats dinner late  Irregular schedule; up 5am to 9am; will test BS often  Having some lows and will discuss with Dr. Cruzita Lederer on Monday  Exercise;  Dr.Kim requested she do chair exercises  Does those on the computer 3 times a week   Mammogram due in Dec Bone density 12/2015 Colonoscopy 1`02/2017     Cardiac Risk Factors include: advanced age (>6men, >81 women);diabetes mellitus;dyslipidemia;family history of premature cardiovascular disease;hypertension;obesity (BMI >30kg/m2);sedentary lifestyle    Objective:     Vitals: BP 120/60 (BP Location: Left Arm, Patient Position: Sitting, Cuff Size: Large)   Pulse 88   Temp 98.1 F (36.7 C) (Oral)   Ht 5\' 4"  (1.626 m)   Wt 231 lb 12.8 oz (105.1 kg)   BMI 39.79 kg/m   Body mass index is 39.79 kg/m.  Advanced Directives 12/23/2016 11/18/2016 10/21/2013 07/19/2013 07/27/2012 07/27/2012 07/18/2012  Does Patient Have a Medical Advance Directive? No No No Patient does not have advance directive - - Patient does not have advance directive;Patient would not like information  Would patient like information on creating a medical advance directive? - - No - patient declined information - - - -  Pre-existing out of facility DNR order (yellow form or pink MOST form) - - - - No No No    Tobacco Social History   Tobacco Use  Smoking Status Never Smoker  Smokeless Tobacco Never Used     Counseling given: Yes     Past  Medical History:  Diagnosis Date  . Alopecia   . Anemia, mild   . Arthritis   . Chronic cough    sees pulmonologist  . Chronic pain    chest wall and abd - s/p extensive eval  . Diabetes mellitus with neuropathy ([place])    sees endocrine  . Diverticulosis   . Fatty liver   . GERD (gastroesophageal reflux disease)    takes Nexium bid, hx erosive esophagitis  . Headache(784.0)    occasionally;r/t sinus   . History of colon polyps   . HTN (hypertension)   . 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 Hyperlipemia   . IBS (irritable bowel syndrome)   . Insomnia    takes Elavil nightly  . Joint pain   . Neuropathy   . Osteomyelitis ([place])   . Pneumonia   . PONV (postoperative nausea and vomiting)   . Seasonal allergies    takes Zyrtec daily  . Sinus tachycardia    Past Surgical History:  Procedure Laterality Date  . AMPUTATION  12/28/2011   Procedure: AMPUTATION DIGIT;  Surgeon: Newt Minion, MD;  Location: [facility];  Service: Orthopedics;  Laterality: Right;  Right Foot 2nd Toe Amputation at MTP (metatarsophalangeal joint)  . AMPUTATION Right 04/25/2012   Procedure: Right Foot 3rd Toe Amputation;  Surgeon: Newt Minion, MD;  Location: [facility];  Service: Orthopedics;  Laterality: Right;  Right Foot Third Toe Amputation   . AMPUTATION Right 07/27/2012   Procedure: Right 4th Toe Amputation at Metatarsophalangeal;  Surgeon: Newt Minion, MD;  Location: [facility];  Service: Orthopedics;  Laterality: Right;  Right 4th Toe Amputation at Metatarsophalangeal  . AMPUTATION Left 07/15/2016   Procedure: Left 2nd Ray Amputation;  Surgeon: Newt Minion, MD;  Location: [facility];  Service: Orthopedics;  Laterality: Left;  . AMPUTATION Left 11/18/2016   Procedure: Left 3rd and 4th Ray Amputation;  Surgeon: Newt Minion, MD;  Location: [facility];  Service: Orthopedics;  Laterality: Left;  . COLONOSCOPY    . LAPAROSCOPIC APPENDECTOMY  01/05/2011   Procedure: APPENDECTOMY LAPAROSCOPIC;   Surgeon: Pedro Earls, MD;  Location: [facility];  Service: General;  Laterality: N/A;  . OOPHORECTOMY  2001  . ROTATOR CUFF REPAIR Right   . TUBAL LIGATION    . VAGINAL HYSTERECTOMY  2001   Family History  Problem Relation Age of Onset  . Lung cancer Mother 27       smoked heavily  . Emphysema Mother   . Hypertension Father   . Hyperlipidemia Father   . Diabetes Father   . Coronary artery disease Father   . Dementia Father   . COPD Father        smoked  . Stomach cancer Paternal Aunt   . Brain cancer Paternal Uncle   . Irritable bowel syndrome Unknown        Several family members on fathers side   . Diabetes Other   . Stomach cancer Maternal Aunt   . Colon cancer Neg Hx    Social History   Socioeconomic History  . Marital status: Married    Spouse name: None  . Number of children: 2  . Years of education: None  . Highest education level: None  Social Needs  . Financial resource strain: None  . Food insecurity - worry: None  . Food insecurity - inability: None  . Transportation needs - medical: None  . Transportation needs - non-medical: None  Occupational History  . Occupation: Retired   Tobacco Use  . Smoking status: Never Smoker  . Smokeless tobacco: Never Used  Substance and Sexual Activity  . Alcohol use: No  . Drug use: No  . Sexual activity: Yes    Birth control/protection: Surgical  Other Topics Concern  . None  Social History Narrative   Caffeine daily    [employer], [employer] no diploma   Married '66   1 dtr- '78; 1 son '71; 2 grandchildren   Occupation: retired 04   Dad with alzheimers-had to place in [place] (summer '10)          Outpatient Encounter Medications as of 12/23/2016  Medication Sig  . acetaminophen (TYLENOL) 500 MG tablet Take 500 mg by mouth every 8 (eight) hours as needed for mild pain.   . cetirizine (ZYRTEC) 10 MG tablet Take 1 tablet (10 mg total) by mouth daily.  . cholecalciferol (VITAMIN D) 1000 units tablet Take 1 tablet by mouth  daily.   . Fluocinolone Acetonide (DERMOTIC) 0.01 % OIL Place 1 drop in ear(s) daily as needed (itching).   . gabapentin (NEURONTIN) 100 MG capsule TAKE 2 CAPSULES BY MOUTH AT BEDTIME  . glucose blood (ONE TOUCH ULTRA TEST) test strip USE TO TEST BLOOD SUGAR THREE TIMES DAILY AS INSTRUCTED  . insulin NPH Human (NOVOLIN  N RELION) 100 UNIT/ML injection Inject under skin 25 units in am and 25 units at bedtime  . insulin regular (NOVOLIN R RELION) 100 units/mL injection INJECT 5 to 25 UNITS INTO THE SKIN THREE TIMES DAILY BEFORE MEALS  . losartan (COZAAR) 25 MG tablet TAKE 1 TABLET BY MOUTH ONCE DAILY  . lovastatin (MEVACOR) 20 MG tablet Take 1 tablet (20 mg total) by mouth at bedtime.  . metFORMIN (GLUCOPHAGE) 1000 MG tablet TAKE 1 TABLET TWICE A DAY WITH A MEAL  . metoprolol succinate (TOPROL XL) 25 MG 24 hr tablet Take 1 tablet (25 mg total) by mouth daily.  . naproxen sodium (ANAPROX) 220 MG tablet Take 220 mg by mouth daily as needed (pain).  Marland Kitchen omeprazole (PRILOSEC) 20 MG capsule Take 2 capsules (40 mg total) by mouth 2 (two) times daily before a meal.  . RELION INSULIN SYRINGE 1ML/31G 31G X 5/16" 1 ML MISC USE 2 TIMES A DAY  . senna (SENOKOT) 8.6 MG TABS tablet Take 1-2 tablets daily by mouth.  . silver sulfADIAZINE (SILVADENE) 1 % cream Apply 1 application daily topically. Apply to foot  . vitamin B-12 (CYANOCOBALAMIN) 1000 MCG tablet Take 1,000 mcg daily by mouth.  . fluconazole (DIFLUCAN) 150 MG tablet Take 1 tablet (150 mg total) by mouth once for 1 dose. May repeat in 3 days if symptoms not resolved.   No facility-administered encounter medications on file as of 12/23/2016.     Activities of Daily Living In your present state of health, do you have any difficulty performing the following activities: 12/23/2016 11/18/2016  Hearing? N N  Vision? N N  Difficulty concentrating or making decisions? N N  Walking or climbing stairs? N N  Dressing or bathing? N N  Doing errands, shopping? N  -  Preparing Food and eating ? N -  Using the Toilet? N -  In the past six months, have you accidently leaked urine? N -  Comment leaks some if she bends over  -  Do you have problems with loss of bowel control? N -  Managing your Medications? N -  Managing your Finances? N -  Housekeeping or managing your Housekeeping? N -  Some recent data might be hidden    Patient Care Team: Lucretia Kern, DO as PCP - General (Family Medicine) Petrinitz, Dellis Filbert, DPM (Inactive) (Podiatry)    Assessment:   This is a routine wellness examination for Beth Holden.  Exercise Activities and Dietary recommendations Current Exercise Habits: Home exercise routine, Type of exercise: Other - see comments(will start to exercise )  Goals    . Exercise 150 min/wk Moderate Activity     Will start with home plan when the doctor tells you your foot is healed;        Fall Risk Fall Risk  12/23/2016 11/30/2015 12/26/2014 07/19/2013  Falls in the past year? No No No No      Depression Screen PHQ 2/9 Scores 12/23/2016 11/30/2015 12/26/2014 07/19/2013  PHQ - 2 Score 0 0 1 0    Negative  Cognitive Function MMSE - Mini Mental State Exam 12/23/2016  Not completed: (No Data)   Ad8 score reviewed for issues:  Issues making decisions:  Less interest in hobbies / activities:  Repeats questions, stories (family complaining):  Trouble using ordinary gadgets (microwave, computer, phone):  Forgets the month or year:   Mismanaging finances:   Remembering appts:  Daily problems with thinking and/or memory: Ad8 score is=0  Immunization History  Administered Date(s) Administered  . Influenza Split 01/06/2011  . Influenza Whole 10/26/2007, 10/14/2008  . Influenza, High Dose Seasonal PF 10/26/2015, 11/08/2016  . Pneumococcal Conjugate-13 04/15/2013  . Pneumococcal Polysaccharide-23 02/04/2010, 11/30/2015  . Td 02/11/2010  . Zoster 02/04/2010    Qualifies for Shingles Vaccine- educated    Screening Tests Health Maintenance  Topic Date Due  . HEMOGLOBIN A1C  03/19/2017  . OPHTHALMOLOGY EXAM  04/11/2017  . FOOT EXAM  12/23/2017  . MAMMOGRAM  12/24/2017  . DEXA SCAN  12/24/2017  . COLONOSCOPY  12/31/2017  . TETANUS/TDAP  02/12/2020  . INFLUENZA VACCINE  Completed  . Hepatitis C Screening  Completed  . PNA vac Low Risk Adult  Completed        Plan:     PCP Notes   Health Maintenance Foot exam completed but going to Dr. Sharol Given on Monday Has edema in left foot but states this gets much better when she props it up at home. Surgical wound looks clean  Will have mammogram later this month   Educated regarding the shingrix  Given hearing information for her spouse  Abnormal Screens  Labs to be drawn  Declined information on AD; states she has information at home   Referrals  no Patient concerns; Worried about her BS but is communicating with ENdo. Some lows, but has discussed plan with the doctor and will discuss again on Monday.  Some urgency with voiding; states it comes and goes Was told to do the kegel exercises per MD Was told to do 20 to 30 - 3 times per day    Nurse Concerns; As noted; the patient tries to take good care of herself, checking her BS 3 to 4 times a day as needed   Given and educated on POC still samples   Next PCP apt Endo and Foot Surgeon on Monday    I have personally reviewed and noted the following in the patient's chart:   . Medical and social history . Use of alcohol, tobacco or illicit drugs  . Current medications and supplements . Functional ability and status . Nutritional status . Physical activity . Advanced directives . List of other physicians . Hospitalizations, surgeries, and ER visits in previous 12 months . Vitals . Screenings to include cognitive, depression, and falls . Referrals and appointments  In addition, I have reviewed and discussed with patient certain preventive protocols, quality  metrics, and best practice recommendations. A written personalized care plan for preventive services as well as general preventive health recommendations were provided to patient.     Wynetta Fines, RN  12/23/2016   Lucretia Kern., DO

## 2016-12-23 NOTE — Patient Instructions (Addendum)
BEFORE YOU LEAVE: -labs -AWV with susan -stool cards for anemia -follow up: 3-4 months - come fasting so can check fasting cholesterol  Schedule follow up with your diabetes doctor  We have ordered labs or studies at this visit. It can take up to 1-2 weeks for results and processing. IF results require follow up or explanation, we will call you with instructions. Clinically stable results will be released to your [facility]. If you have not heard from [place] or cannot find your results in [facility] in 2 weeks please contact our office at [phone].  If you are not yet signed up for [facility], please consider signing up.   We recommend the following healthy lifestyle for LIFE: 1) Small portions. But, make sure to get regular (at least 3 per day), healthy meals and small healthy snacks if needed.  2) Eat a healthy clean diet.   TRY TO EAT: -at least 5-7 servings of low sugar, colorful, and nutrient rich vegetables per day (not corn, potatoes or bananas.) -berries are the best choice if you wish to eat fruit (only eat small amounts if trying to reduce weight)  -lean meets (fish, white meat of chicken or [place]) -vegan proteins for some meals - beans or tofu, whole grains, nuts and seeds -Replace bad fats with good fats - good fats include: fish, nuts and seeds, canola oil, olive oil -small amounts of low fat or non fat dairy -small amounts of100 % whole grains - check the lables -drink plenty of water  AVOID: -SUGAR, sweets, anything with added sugar, corn syrup or sweeteners - must read labels as even foods advertised as "healthy" often are loaded with sugar -if you must have a sweetener, small amounts of stevia may be best -sweetened beverages and artificially sweetened beverages -simple starches (rice, bread, potatoes, pasta, chips, etc - small amounts of 100% whole grains are ok) -red meat, pork, butter -fried foods, fast food, processed food, excessive dairy, eggs and coconut.  3)Get at  least 150 minutes of sweaty aerobic exercise per week.  4)Reduce stress - consider counseling, meditation and relaxation to balance other aspects of your life.     Beth Holden , Thank you for taking time to come for your Medicare Wellness Visit. I appreciate your ongoing commitment to your health goals. Please review the following plan we discussed and let me know if I can assist you in the future.   Will have a mammogram in December  Shingrix is a vaccine for the prevention of Shingles in Adults 37 and older.  If you are on Medicare, you can request a prescription from your doctor to be filled at a pharmacy.  Please check with your benefits regarding applicable copays or out of pocket expenses.  The Shingrix is given in 2 vaccines approx 8 weeks apart. You must receive the 2nd dose prior to 6 months from receipt of the first.    Can have a hearing test and medicare does pay Deaf & Hard of Hearing Division Services - can assist with hearing aid x 1  No reviews  [facility]  [address]  [phone]  [email or web address].acquia-[email or web address]/[email or web address]_How_to_Buy_Hearing_Aids.pdf    These are the goals we discussed: Goals    . Exercise 150 min/wk Moderate Activity     Will start with home plan when the doctor tells you your foot is healed;        This is a list of the screening recommended for you and due dates:  Health Maintenance  Topic Date Due  . Hemoglobin A1C  03/19/2017  . Eye exam for diabetics  04/11/2017  . Complete foot exam   12/23/2017  . Mammogram  12/24/2017  . DEXA scan (bone density measurement)  12/24/2017  . Colon Cancer Screening  12/31/2017  . Tetanus Vaccine  02/12/2020  . Flu Shot  Completed  .  Hepatitis C: One time screening is recommended by Center for Disease Control  (CDC) for  adults born from [birth year] through 1963.   Completed  . Pneumonia vaccines  Completed   Prevention of falls: Remove  rugs or any tripping hazards in the home Use Non slip mats in bathtubs and showers Placing grab bars next to the toilet and or shower Placing handrails on both sides of the stair way Adding extra lighting in the home.   Personal safety issues reviewed:  1. Consider starting a community watch program per [facility] 2.  Changes batteries is smoke detector and/or carbon monoxide detector  3.  If you have firearms; keep them in a safe place 4.  Wear protection when in the sun; Always wear sunscreen or a hat; It is good to have your doctor check your skin annually or review any new areas of concern 5. Driving safety; Keep in the right lane; stay 3 car lengths behind the car in front of you on the highway; look 3 times prior to pulling out; carry your cell phone everywhere you go!    Learn about the Yellow Dot program:  The program allows first responders at your emergency to have access to who your physician is, as well as your medications and medical conditions.  Citizens requesting the Yellow Dot Packages should contact Master Corporal Nunzio Cobbs at the [facility] [phone] for the first week of the program and beginning the week after Easter citizens should contact their Scientist, physiological.      Fall Prevention in the Home Falls can cause injuries and can affect people from all age groups. There are many simple things that you can do to make your home safe and to help prevent falls. What can I do on the outside of my home?  Regularly repair the edges of walkways and driveways and fix any cracks.  Remove high doorway thresholds.  Trim any shrubbery on the main path into your home.  Use bright outdoor lighting.  Clear walkways of debris and clutter, including tools and rocks.  Regularly check that handrails are securely fastened and in good repair. Both sides of any steps should have handrails.  Install guardrails along the edges of  any raised decks or porches.  Have leaves, snow, and ice cleared regularly.  Use sand or salt on walkways during winter months.  In the garage, clean up any spills right away, including grease or oil spills. What can I do in the bathroom?  Use night lights.  Install grab bars by the toilet and in the tub and shower. Do not use towel bars as grab bars.  Use non-skid mats or decals on the floor of the tub or shower.  If you need to sit down while you are in the shower, use a plastic, non-slip stool.  Keep the floor dry. Immediately clean up any water that spills on the floor.  Remove soap buildup in the tub or shower on a regular basis.  Attach bath mats securely with double-sided non-slip rug tape.  Remove throw rugs and other tripping hazards  from the floor. What can I do in the bedroom?  Use night lights.  Make sure that a bedside light is easy to reach.  Do not use oversized bedding that drapes onto the floor.  Have a firm chair that has side arms to use for getting dressed.  Remove throw rugs and other tripping hazards from the floor. What can I do in the kitchen?  Clean up any spills right away.  Avoid walking on wet floors.  Place frequently used items in easy-to-reach places.  If you need to reach for something above you, use a sturdy step stool that has a grab bar.  Keep electrical cables out of the way.  Do not use floor polish or wax that makes floors slippery. If you have to use wax, make sure that it is non-skid floor wax.  Remove throw rugs and other tripping hazards from the floor. What can I do in the stairways?  Do not leave any items on the stairs.  Make sure that there are handrails on both sides of the stairs. Fix handrails that are broken or loose. Make sure that handrails are as long as the stairways.  Check any carpeting to make sure that it is firmly attached to the stairs. Fix any carpet that is loose or worn.  Avoid having throw rugs  at the top or bottom of stairways, or secure the rugs with carpet tape to prevent them from moving.  Make sure that you have a light switch at the top of the stairs and the bottom of the stairs. If you do not have them, have them installed. What are some other fall prevention tips?  Wear closed-toe shoes that fit well and support your feet. Wear shoes that have rubber soles or low heels.  When you use a stepladder, make sure that it is completely opened and that the sides are firmly locked. Have someone hold the ladder while you are using it. Do not climb a closed stepladder.  Add color or contrast paint or tape to grab bars and handrails in your home. Place contrasting color strips on the first and last steps.  Use mobility aids as needed, such as canes, walkers, scooters, and crutches.  Turn on lights if it is dark. Replace any light bulbs that burn out.  Set up furniture so that there are clear paths. Keep the furniture in the same spot.  Fix any uneven floor surfaces.  Choose a carpet design that does not hide the edge of steps of a stairway.  Be aware of any and all pets.  Review your medicines with your healthcare provider. Some medicines can cause dizziness or changes in blood pressure, which increase your risk of falling. Talk with your health care provider about other ways that you can decrease your risk of falls. This may include working with a physical therapist or trainer to improve your strength, balance, and endurance. This information is not intended to replace advice given to you by your health care provider. Make sure you discuss any questions you have with your health care provider. Document Released: 12/17/2001 Document Revised: 05/26/2015 Document Reviewed: 01/31/2014 Elsevier Interactive Patient Education  2017 [place] Maintenance for Postmenopausal Women Menopause is a normal process in which your reproductive ability comes to an end. This process  happens gradually over a span of months to years, usually between the ages of 19 and 49. Menopause is complete when you have missed 12 consecutive menstrual periods. It is important  to talk with your health care provider about some of the most common conditions that affect postmenopausal women, such as heart disease, cancer, and bone loss (osteoporosis). Adopting a healthy lifestyle and getting preventive care can help to promote your health and wellness. Those actions can also lower your chances of developing some of these common conditions. What should I know about menopause? During menopause, you may experience a number of symptoms, such as:  Moderate-to-severe hot flashes.  Night sweats.  Decrease in sex drive.  Mood swings.  Headaches.  Tiredness.  Irritability.  Memory problems.  Insomnia.  Choosing to treat or not to treat menopausal changes is an individual decision that you make with your health care provider. What should I know about hormone replacement therapy and supplements? Hormone therapy products are effective for treating symptoms that are associated with menopause, such as hot flashes and night sweats. Hormone replacement carries certain risks, especially as you become older. If you are thinking about using estrogen or estrogen with progestin treatments, discuss the benefits and risks with your health care provider. What should I know about heart disease and stroke? Heart disease, heart attack, and stroke become more likely as you age. This may be due, in part, to the hormonal changes that your body experiences during menopause. These can affect how your body processes dietary fats, triglycerides, and cholesterol. Heart attack and stroke are both medical emergencies. There are many things that you can do to help prevent heart disease and stroke:  Have your blood pressure checked at least every 1-2 years. High blood pressure causes heart disease and increases the risk  of stroke.  If you are 41-41 years old, ask your health care provider if you should take aspirin to prevent a heart attack or a stroke.  Do not use any tobacco products, including cigarettes, chewing tobacco, or electronic cigarettes. If you need help quitting, ask your health care provider.  It is important to eat a healthy diet and maintain a healthy weight. ? Be sure to include plenty of vegetables, fruits, low-fat dairy products, and lean protein. ? Avoid eating foods that are high in solid fats, added sugars, or salt (sodium).  Get regular exercise. This is one of the most important things that you can do for your health. ? Try to exercise for at least 150 minutes each week. The type of exercise that you do should increase your heart rate and make you sweat. This is known as moderate-intensity exercise. ? Try to do strengthening exercises at least twice each week. Do these in addition to the moderate-intensity exercise.  Know your numbers.Ask your health care provider to check your cholesterol and your blood glucose. Continue to have your blood tested as directed by your health care provider.  What should I know about cancer screening? There are several types of cancer. Take the following steps to reduce your risk and to catch any cancer development as early as possible. Breast Cancer  Practice breast self-awareness. ? This means understanding how your breasts normally appear and feel. ? It also means doing regular breast self-exams. Let your health care provider know about any changes, no matter how small.  If you are 41 or older, have a clinician do a breast exam (clinical breast exam or CBE) every year. Depending on your age, family history, and medical history, it may be recommended that you also have a yearly breast X-ray (mammogram).  If you have a family history of breast cancer, talk with your health  care provider about genetic screening.  If you are at high risk for breast  cancer, talk with your health care provider about having an MRI and a mammogram every year.  Breast cancer (BRCA) gene test is recommended for women who have family members with BRCA-related cancers. Results of the assessment will determine the need for genetic counseling and BRCA1 and for BRCA2 testing. BRCA-related cancers include these types: ? Breast. This occurs in males or females. ? Ovarian. ? Tubal. This may also be called fallopian tube cancer. ? Cancer of the abdominal or pelvic lining (peritoneal cancer). ? Prostate. ? Pancreatic.  Cervical, Uterine, and Ovarian Cancer Your health care provider may recommend that you be screened regularly for cancer of the pelvic organs. These include your ovaries, uterus, and vagina. This screening involves a pelvic exam, which includes checking for microscopic changes to the surface of your cervix (Pap test).  For women ages 21-65, health care providers may recommend a pelvic exam and a Pap test every three years. For women ages 66-65, they may recommend the Pap test and pelvic exam, combined with testing for human papilloma virus (HPV), every five years. Some types of HPV increase your risk of cervical cancer. Testing for HPV may also be done on women of any age who have unclear Pap test results.  Other health care providers may not recommend any screening for nonpregnant women who are considered low risk for pelvic cancer and have no symptoms. Ask your health care provider if a screening pelvic exam is right for you.  If you have had past treatment for cervical cancer or a condition that could lead to cancer, you need Pap tests and screening for cancer for at least 20 years after your treatment. If Pap tests have been discontinued for you, your risk factors (such as having a new sexual partner) need to be reassessed to determine if you should start having screenings again. Some women have medical problems that increase the chance of getting cervical  cancer. In these cases, your health care provider may recommend that you have screening and Pap tests more often.  If you have a family history of uterine cancer or ovarian cancer, talk with your health care provider about genetic screening.  If you have vaginal bleeding after reaching menopause, tell your health care provider.  There are currently no reliable tests available to screen for ovarian cancer.  Lung Cancer Lung cancer screening is recommended for adults 29-51 years old who are at high risk for lung cancer because of a history of smoking. A yearly low-dose CT scan of the lungs is recommended if you:  Currently smoke.  Have a history of at least 30 pack-years of smoking and you currently smoke or have quit within the past 15 years. A pack-year is smoking an average of one pack of cigarettes per day for one year.  Yearly screening should:  Continue until it has been 15 years since you quit.  Stop if you develop a health problem that would prevent you from having lung cancer treatment.  Colorectal Cancer  This type of cancer can be detected and can often be prevented.  Routine colorectal cancer screening usually begins at age 58 and continues through age 34.  If you have risk factors for colon cancer, your health care provider may recommend that you be screened at an earlier age.  If you have a family history of colorectal cancer, talk with your health care provider about genetic screening.  Your health  care provider may also recommend using home test kits to check for hidden blood in your stool.  A small camera at the end of a tube can be used to examine your colon directly (sigmoidoscopy or colonoscopy). This is done to check for the earliest forms of colorectal cancer.  Direct examination of the colon should be repeated every 5-10 years until age 67. However, if early forms of precancerous polyps or small growths are found or if you have a family history or genetic risk  for colorectal cancer, you may need to be screened more often.  Skin Cancer  Check your skin from head to toe regularly.  Monitor any moles. Be sure to tell your health care provider: ? About any new moles or changes in moles, especially if there is a change in a mole's shape or color. ? If you have a mole that is larger than the size of a pencil eraser.  If any of your family members has a history of skin cancer, especially at a young age, talk with your health care provider about genetic screening.  Always use sunscreen. Apply sunscreen liberally and repeatedly throughout the day.  Whenever you are outside, protect yourself by wearing long sleeves, pants, a wide-brimmed hat, and sunglasses.  What should I know about osteoporosis? Osteoporosis is a condition in which bone destruction happens more quickly than new bone creation. After menopause, you may be at an increased risk for osteoporosis. To help prevent osteoporosis or the bone fractures that can happen because of osteoporosis, the following is recommended:  If you are 37-69 years old, get at least 1,000 mg of calcium and at least 600 mg of vitamin D per day.  If you are older than age 3 but younger than age 76, get at least 1,200 mg of calcium and at least 600 mg of vitamin D per day.  If you are older than age 85, get at least 1,200 mg of calcium and at least 800 mg of vitamin D per day.  Smoking and excessive alcohol intake increase the risk of osteoporosis. Eat foods that are rich in calcium and vitamin D, and do weight-bearing exercises several times each week as directed by your health care provider. What should I know about how menopause affects my mental health? Depression may occur at any age, but it is more common as you become older. Common symptoms of depression include:  Low or sad mood.  Changes in sleep patterns.  Changes in appetite or eating patterns.  Feeling an overall lack of motivation or enjoyment of  activities that you previously enjoyed.  Frequent crying spells.  Talk with your health care provider if you think that you are experiencing depression. What should I know about immunizations? It is important that you get and maintain your immunizations. These include:  Tetanus, diphtheria, and pertussis (Tdap) booster vaccine.  Influenza every year before the flu season begins.  Pneumonia vaccine.  Shingles vaccine.  Your health care provider may also recommend other immunizations. This information is not intended to replace advice given to you by your health care provider. Make sure you discuss any questions you have with your health care provider. Document Released: 02/18/2005 Document Revised: 07/17/2015 Document Reviewed: 09/30/2014 Elsevier Interactive Patient Education  2018 [employer].

## 2016-12-26 ENCOUNTER — Ambulatory Visit (INDEPENDENT_AMBULATORY_CARE_PROVIDER_SITE_OTHER): Payer: Medicare Other | Admitting: Orthopedic Surgery

## 2016-12-26 ENCOUNTER — Encounter: Payer: Self-pay | Admitting: Internal Medicine

## 2016-12-26 ENCOUNTER — Ambulatory Visit (INDEPENDENT_AMBULATORY_CARE_PROVIDER_SITE_OTHER): Payer: Medicare Other | Admitting: Internal Medicine

## 2016-12-26 ENCOUNTER — Encounter (INDEPENDENT_AMBULATORY_CARE_PROVIDER_SITE_OTHER): Payer: Self-pay | Admitting: Orthopedic Surgery

## 2016-12-26 VITALS — Ht 64.0 in | Wt 231.0 lb

## 2016-12-26 VITALS — BP 122/70 | HR 78 | Ht 65.5 in | Wt 230.2 lb

## 2016-12-26 DIAGNOSIS — E1151 Type 2 diabetes mellitus with diabetic peripheral angiopathy without gangrene: Secondary | ICD-10-CM

## 2016-12-26 DIAGNOSIS — Z89422 Acquired absence of other left toe(s): Secondary | ICD-10-CM | POA: Diagnosis not present

## 2016-12-26 DIAGNOSIS — L97511 Non-pressure chronic ulcer of other part of right foot limited to breakdown of skin: Secondary | ICD-10-CM

## 2016-12-26 DIAGNOSIS — IMO0002 Reserved for concepts with insufficient information to code with codable children: Secondary | ICD-10-CM

## 2016-12-26 DIAGNOSIS — E1165 Type 2 diabetes mellitus with hyperglycemia: Secondary | ICD-10-CM

## 2016-12-26 LAB — POCT GLYCOSYLATED HEMOGLOBIN (HGB A1C): Hemoglobin A1C: 7.3

## 2016-12-26 NOTE — Progress Notes (Signed)
Office Visit Note   [name]           Date of Birth: 06/30/1945           MRN: [id number] Visit Date: 12/26/2016              Requested by: Lucretia Kern, DO [address] [place], [place] [address] PCP: Lucretia Kern, DO  Chief Complaint  Patient presents with  . Left Foot - Routine Post Op    11/18/16 left 3rd and 4th ray amputation       HPI: Patient presents for evaluation for 2 separate items #1 left foot third and fourth ray amputations #2 chronic ulcer beneath the plantar region right foot third and fourth metatarsal heads.  Patient states she is recently been diagnosed with anemia she is going to undergo a GI study.  Assessment & Plan: Visit Diagnoses:  1. H/O amputation of lesser toe, left ([place])   2. Non-pressure chronic ulcer of other part of right foot limited to breakdown of skin ([place])     Plan: Recommended green leafy vegetables to help with the anemia.  Patient states that she cannot eat salads.  Recommended exercise to strengthen the lower extremities and improve circulation.  No restrictions to the left foot at this time continue with the felt relieving dominant on the right.  Follow-Up Instructions: Return in about 3 weeks (around 01/16/2017).   Ortho Exam  Patient is alert, oriented, no adenopathy, well-dressed, normal affect, normal respiratory effort. Examination patient's amputation of the left foot is healed well there is no redness no cellulitis no open wound no signs of infection.  Examination of the right foot she has a chronic Waggoner grade 1 ulcer.  After informed consent a 10 blade knife was used to debride the skin and soft tissue back to healthy viable granulation tissue.  This was touched with silver nitrate.  The ulcer is 2 cm in diameter and 3 mm deep.  There is no redness no cellulitis no drainage no signs of exposed bone or tendon.  This is beneath the third and fourth metatarsal heads.  Imaging: No results found. No  images are attached to the encounter.  Labs: Lab Results  Component Value Date   HGBA1C 7.0 09/19/2016   HGBA1C 7.6 06/20/2016   HGBA1C 7.7 03/25/2016   LABURIC 5.5 09/01/2015   LABORGA GROUP B STREP (S.AGALACTIAE) ISOLATED 12/26/2014    @LABSALLVALUES (HGBA1)@  Body mass index is 39.65 kg/m.  Orders:  No orders of the defined types were placed in this encounter.  No orders of the defined types were placed in this encounter.    Procedures: No procedures performed  Clinical Data: No additional findings.  ROS:  All other systems negative, except as noted in the HPI. Review of Systems  Objective: Vital Signs: Ht 5\' 4"  (1.626 m)   Wt 231 lb (104.8 kg)   BMI 39.65 kg/m   Specialty Comments:  No specialty comments available.  PMFS History: Patient Active Problem List   Diagnosis Date Noted  . Right foot ulcer, limited to breakdown of skin ([place]) 09/26/2016  . Osteomyelitis of toe of left foot ([place])   . Baker's cyst 07/10/2016  . Tachycardia 03/04/2016  . Onychomycosis 12/07/2015  . Non-pressure chronic ulcer of other part of right foot limited to breakdown of skin ([place]) 12/07/2015  . Upper airway cough syndrome 03/05/2014  . S/P amputation of lesser toe - followed by Dr. Sharol Given 04/15/2013  . Pain  in joint, shoulder region, s/p RTC surgery - followed by Dr. Sharol Given 04/15/2013  . ANEMIA, MILD 02/04/2010  . Essential hypertension 12/05/2006  . Uncontrolled type 2 diabetes mellitus with peripheral circulatory disorder ([place]) 10/12/2006  . Hyperlipidemia 10/12/2006  . Allergic rhinitis 10/12/2006  . GERD - Followed by Dr. Fuller Plan 10/12/2006  . IRRITABLE BOWEL SYNDROME - followed by Dr. Fuller Plan 10/12/2006  . Peripheral neuropathy 10/11/2006  . ALOPECIA NEC 10/11/2006   Past Medical History:  Diagnosis Date  . Alopecia   . Anemia, mild   . Arthritis   . Chronic cough    sees pulmonologist  . Chronic pain    chest wall and abd - s/p extensive eval  . Diabetes mellitus  with neuropathy ([place])    sees endocrine  . Diverticulosis   . Fatty liver   . GERD (gastroesophageal reflux disease)    takes Nexium bid, hx erosive esophagitis  . Headache(784.0)    occasionally;r/t sinus   . History of colon polyps   . HTN (hypertension)   . Hx of amputation of lesser toe ([place])    sees podiatrist  . Hyperlipemia   . IBS (irritable bowel syndrome)   . Insomnia    takes Elavil nightly  . Joint pain   . Neuropathy   . Osteomyelitis ([place])   . Pneumonia   . PONV (postoperative nausea and vomiting)   . Seasonal allergies    takes Zyrtec daily  . Sinus tachycardia     Family History  Problem Relation Age of Onset  . Lung cancer Mother 98       smoked heavily  . Emphysema Mother   . Hypertension Father   . Hyperlipidemia Father   . Diabetes Father   . Coronary artery disease Father   . Dementia Father   . COPD Father        smoked  . Stomach cancer Paternal Aunt   . Brain cancer Paternal Uncle   . Irritable bowel syndrome Unknown        Several family members on fathers side   . Diabetes Other   . Stomach cancer Maternal Aunt   . Colon cancer Neg Hx     Past Surgical History:  Procedure Laterality Date  . AMPUTATION  12/28/2011   Procedure: AMPUTATION DIGIT;  Surgeon: Newt Minion, MD;  Location: [facility];  Service: Orthopedics;  Laterality: Right;  Right Foot 2nd Toe Amputation at MTP (metatarsophalangeal joint)  . AMPUTATION Right 04/25/2012   Procedure: Right Foot 3rd Toe Amputation;  Surgeon: Newt Minion, MD;  Location: [facility];  Service: Orthopedics;  Laterality: Right;  Right Foot Third Toe Amputation   . AMPUTATION Right 07/27/2012   Procedure: Right 4th Toe Amputation at Metatarsophalangeal;  Surgeon: Newt Minion, MD;  Location: [facility];  Service: Orthopedics;  Laterality: Right;  Right 4th Toe Amputation at Metatarsophalangeal  . AMPUTATION Left 07/15/2016   Procedure: Left 2nd Ray Amputation;  Surgeon: Newt Minion, MD;  Location: [facility];   Service: Orthopedics;  Laterality: Left;  . AMPUTATION Left 11/18/2016   Procedure: Left 3rd and 4th Ray Amputation;  Surgeon: Newt Minion, MD;  Location: [facility];  Service: Orthopedics;  Laterality: Left;  . COLONOSCOPY    . LAPAROSCOPIC APPENDECTOMY  01/05/2011   Procedure: APPENDECTOMY LAPAROSCOPIC;  Surgeon: Pedro Earls, MD;  Location: [facility];  Service: General;  Laterality: N/A;  . OOPHORECTOMY  2001  . ROTATOR CUFF REPAIR Right   . TUBAL  LIGATION    . VAGINAL HYSTERECTOMY  2001   Social History   Occupational History  . Occupation: Retired   Tobacco Use  . Smoking status: Never Smoker  . Smokeless tobacco: Never Used  Substance and Sexual Activity  . Alcohol use: No  . Drug use: No  . Sexual activity: Yes    Birth control/protection: Surgical

## 2016-12-26 NOTE — Progress Notes (Signed)
Subjective:     Patient ID: Beth Holden, female   DOB: March 17, 1945, 71 y.o.   MRN: [id number]  HPI [name] is a pleasant 71 y.o. woman returning for f/u for DM2, dx ~2000, uncontrolled, insulin-dependent, with complications (diabetic peripheral neuropathy, multiple toe amputaions - 3 for each foot). Last visit 3.5 mo ago.  She had 2 toes amputated 1 mo ago >> sugars higher.  She also had a URI >> sugars high then also.  Last HbA1C was: Lab Results  Component Value Date   HGBA1C 7.0 09/19/2016   HGBA1C 7.6 06/20/2016   HGBA1C 7.7 03/25/2016   She is now on: - Metformin  1000 mg 2x a day  Insulin Before breakfast Before lunch Before dinner At bedtime  Regular  20 units with a small meal  25 units with a larger meal  5 units with a smaller meal  10 units with a larger meal  20 units with a smaller meal  25 units with a larger meal   NPH 25 x x 25   - Sliding scale of R insulin: 150-200: + 2 unit 201-250: + 3 units 251-300: + 4 units >300: + 5 units  She also tried: - Levemir 47 units in am and 37 units in pm >> $800 for 3 mo supply - Januvia 100 mg daily - $450 for 3 mo - Invokana 100 mg - $455 for 3 mo - Took Byetta before We discussed about starting her on a VGo mechanical pump in the past. She had an appointment with diabetes education but decided not to pursue it.  She checks her sugars 3x a day: - am: 113-198, 238 (high after a low) >> 78, 101-163, 195 - 2h after b'fast : 182-261, 313 >> 176-197, 305 >> 139-182 >> 130, 131-242 - prelunch:  69 x1 >> 136-241, 317 >> n/c >> 110-214 >> n/c - 2-3h after lunch:52 (took 12 units with lunch), 124-202, 265 >> n/c >> 212  - before dinner: 60 x1, 132-254 >> 54-245 >> 56, 110, 141, 177 >> 173 - after dinner:96, 154-240 >> n/c >> 330, 407 >> 104-226 >> 183, 263 >> 203 - bedtime: 160, 178, 226, 281 >> 123-313 >> 136-353 >> 124, 135, 300 >> 138-238, 334 - nighttime: 46 x1, 98-214 >> 56 >> 47, 54-217 >> 69, 75-177 Lowest:  47 >> 69.  Has hypoglycemia awareness in the 70s. Highest: 400s >> 334  Meals: - Breakfast: egg, bacon, toast; grits; biscuit; fruit; sometimes skips - Lunch: 1/2 PB sandwich or soup and yoghurt, sometimes crackers - Dinner: meat + vegetables + some starch - Snacks: fruit   -No CKD. BUN  Date Value Ref Range Status  11/18/2016 9 6 - 20 mg/dL Final   Creatinine, Ser  Date Value Ref Range Status  11/18/2016 0.79 0.44 - 1.00 mg/dL Final  On enalapril.  -+ HL; latest lipid panel: Lab Results  Component Value Date   CHOL 166 11/30/2015   HDL 57.10 11/30/2015   LDLCALC 80 11/30/2015   TRIG 145.0 11/30/2015   CHOLHDL 3 11/30/2015  On Mevacor. - last eye exam in Spring 2018 (Dr. Zadie Rhine): + mild DR, no glaucoma  - + numbness and tingling in feet -off amitriptyline but continues on gabapentin.  Review of Systems Constitutional: no weight gain/no weight loss, + fatigue, no subjective hyperthermia, no subjective hypothermia, + nocturia Eyes: no blurry vision, no xerophthalmia ENT: no sore throat, no nodules palpated in throat, no dysphagia, no odynophagia, no hoarseness Cardiovascular:  no CP/no SOB/no palpitations/+ leg swelling: L>R Respiratory: no cough/no SOB/no wheezing Gastrointestinal: no N/no V/no D/no C/no acid reflux Musculoskeletal: no muscle aches/no joint aches Skin: + rashes/itching, no hair loss Neurological: no tremors/+ numbness/+ tingling/no dizziness  I reviewed pt's medications, allergies, PMH, social hx, family hx, and changes were documented in the history of present illness. Otherwise, unchanged from my initial visit note.  Objective:   Physical Exam BP 122/70   Pulse 78   Ht 5' 5.5" (1.664 m)   Wt 230 lb 3.2 oz (104.4 kg)   SpO2 94%   BMI 37.72 kg/m  Body mass index is 37.72 kg/m. Wt Readings from Last 3 Encounters:  12/26/16 230 lb 3.2 oz (104.4 kg)  12/26/16 231 lb (104.8 kg)  12/23/16 231 lb 12.8 oz (105.1 kg)   Constitutional: overweight, in  NAD Eyes: PERRLA, EOMI, no exophthalmos ENT: moist mucous membranes, no thyromegaly, no cervical lymphadenopathy Cardiovascular: RRR, No MRG Respiratory: CTA B Gastrointestinal: abdomen soft, NT, ND, BS+ Musculoskeletal: strength intact in all 4 Skin: moist, warm, no rashes Neurological: no tremor with outstretched hands, DTR normal in all 4   Assessment:     1. DM2, uncontrolled, insulin-dependent, with complications - diabetic peripheral neuropathy - 3 toe amputations - after infected diabetic toe ulcers >> OM:  R 2nd toe amputated on 12/28/2011  R 3rd toe amputated on 04/25/2012  R 4th toe amputated on 07/27/2012  L 2nd toe amputated on 07/15/2016  L1st and 5th toes amputated 11/18/2016    2. 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 DM2 - pt with long standing, uncontrolled, DM2, with fluctuating blood sugars, more consistent with insulin deficiency (LADA pattern).  At this visit, she has less lows and but some of her sugars are higher than before.  We discussed that this can happen around the time of the holidays and also due to her recent surgery.  As it is very important not to drop her sugars anymore, we will continue the current regimen, with more attention to meals. -  For now, we will continue NPH at bedtime which is safer for avoiding hypoglycemia overnight - I advised her to: Patient Instructions   Please continue: - Metformin  1000 mg 2x a day  Insulin Before breakfast Before lunch Before dinner At bedtime  Regular  20 units with a small meal  25 units with a larger meal  5 units with a smaller meal  10 units with a larger meal  20 units with a smaller meal  25 units with a larger meal   NPH 25 x x 25   - Sliding scale of R insulin: 150-200: + 2 unit 201-250: + 3 units 251-300: + 4 units >300: + 5 units  - today, HbA1c is 7.3% (higher) - continue checking sugars at different times of the day - check 3x a day, rotating checks >> brings a good log - advised  for yearly eye exams >> she is UTD - Return to clinic in 3 mo with sugar log     2. 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 Ozzie Hoyle, MD PhD [facility]

## 2016-12-26 NOTE — Patient Instructions (Signed)
Please continue: - Metformin  1000 mg 2x a day  Insulin Before breakfast Before lunch Before dinner At bedtime  Regular  20 units with a small meal  25 units with a larger meal  5 units with a smaller meal  10 units with a larger meal  20 units with a smaller meal  25 units with a larger meal   NPH 25 x x 25   - Sliding scale of R insulin: 150-200: + 2 unit 201-250: + 3 units 251-300: + 4 units >300: + 5 units  Please return in 3-4 months with your sugar log.

## 2016-12-29 ENCOUNTER — Telehealth: Payer: Self-pay | Admitting: Gastroenterology

## 2016-12-29 NOTE — Telephone Encounter (Signed)
Patient scheduled for 01/13/17.  She is offered 01/06/17, but she is unable to come due to being out of town.

## 2017-01-02 LAB — POC HEMOCCULT BLD/STL (HOME/3-CARD/SCREEN)
Card #2 Fecal Occult Blod, POC: NEGATIVE
Card #3 Fecal Occult Blood, POC: NEGATIVE
Fecal Occult Blood, POC: NEGATIVE

## 2017-01-02 NOTE — Addendum Note (Signed)
Addended by: Elmer Picker on: 01/02/2017 08:08 AM   Modules accepted: Orders

## 2017-01-12 NOTE — Addendum Note (Signed)
Addended by: Agnes Lawrence on: 01/12/2017 01:47 PM   Modules accepted: Orders

## 2017-01-13 ENCOUNTER — Encounter: Payer: Self-pay | Admitting: Physician Assistant

## 2017-01-13 ENCOUNTER — Ambulatory Visit (INDEPENDENT_AMBULATORY_CARE_PROVIDER_SITE_OTHER): Payer: Medicare Other | Admitting: Physician Assistant

## 2017-01-13 ENCOUNTER — Other Ambulatory Visit (INDEPENDENT_AMBULATORY_CARE_PROVIDER_SITE_OTHER): Payer: Medicare Other

## 2017-01-13 VITALS — BP 106/60 | HR 88 | Ht 64.0 in | Wt 234.6 lb

## 2017-01-13 DIAGNOSIS — K582 Mixed irritable bowel syndrome: Secondary | ICD-10-CM | POA: Diagnosis not present

## 2017-01-13 DIAGNOSIS — R1031 Right lower quadrant pain: Secondary | ICD-10-CM

## 2017-01-13 DIAGNOSIS — D509 Iron deficiency anemia, unspecified: Secondary | ICD-10-CM

## 2017-01-13 DIAGNOSIS — G8929 Other chronic pain: Secondary | ICD-10-CM | POA: Diagnosis not present

## 2017-01-13 LAB — IBC PANEL
Iron: 19 ug/dL — ABNORMAL LOW (ref 42–145)
Saturation Ratios: 4.2 % — ABNORMAL LOW (ref 20.0–50.0)
Transferrin: 321 mg/dL (ref 212.0–360.0)

## 2017-01-13 LAB — FERRITIN: Ferritin: 15.3 ng/mL (ref 10.0–291.0)

## 2017-01-13 MED ORDER — NA SULFATE-K SULFATE-MG SULF 17.5-3.13-1.6 GM/177ML PO SOLN: 1.0000 | ORAL | 0 refills | Status: DC

## 2017-01-13 NOTE — Patient Instructions (Addendum)
Start Align probiotic daily after colonoscopy for 2 months.   Start miralax once daily.   Your physician has requested that you go to the basement for lab work before leaving today.  You have been scheduled for an endoscopy and colonoscopy. Please follow the written instructions given to you at your visit today. Please pick up your prep supplies at the pharmacy within the next 1-3 days. If you use inhalers (even only as needed), please bring them with you on the day of your procedure. Your physician has requested that you go to www.startemmi.com and enter the access code given to you at your visit today. This web site gives a general overview about your procedure. However, you should still follow specific instructions given to you by our office regarding your preparation for the procedure.   _  _   ORAL DIABETIC MEDICATION INSTRUCTIONS  The day before your procedure:  Take your diabetic pill as you do normally  The day of your procedure:  Do not take your diabetic pill   We will check your blood sugar levels during the admission process and again in Recovery before discharging you home  ______________________________________________________________________  _  _   INSULIN (LONG ACTING) MEDICATION INSTRUCTIONS (Lantus, NPH, 70/30, Humulin, Novolin-N, Levemir, Toujeo )   The day before your procedure:  Take  your regular evening dose    The day of your procedure:  Do not take your morning dose   _  _   INSULIN (SHORT ACTING) MEDICATION INSTRUCTIONS (Regular, Humulog, Novolog, Apidra, Novolin, Humulin)   The day before your procedure:  Do not take your evening dose   The day of your procedure:  Do not take your morning dose  ______________________________________________________________________                _ _ OTHER NON-INSULIN INJECTABLE MEDICATIONS         (Tanzeum, Trulicity, Byetta, Victoza, Bydureon, SymlinPen)  Hold the am of the procedure

## 2017-01-13 NOTE — Progress Notes (Signed)
Reviewed and agree with management plan. Awaiting ferritin and IBC panel. If she is not iron deficient could postpone colonoscopy and EGD until Hematology evaluation is completed.   Pricilla Riffle. Fuller Plan, MD [facility]

## 2017-01-13 NOTE — Progress Notes (Signed)
Chief Complaint: Anemia  HPI:    Beth Holden is a 72 year old female with a past medical history as listed below, who was referred to me by Lucretia Kern, DO for a complaint of anemia.      Patient has followed with Dr. Fuller Plan in the past.  Her last EGD was 02/14/14 and was normal.  Last colonoscopy 12/31/12 was normal and repeat was recommended in 5 years due to personal history of adenomatous colon polyps in the past.    Most recently, the patient had labs completed 12/23/16 which showed a CBC with a hemoglobin low at 9.8, this was 9.6 on 11/18/16 and typically ranges around 11, last 6 months ago.  Hemoccult cards were done and negative.    Today, the patient presents to clinic and explains that she has always had irritable bowel symptoms and had alteration in her bowel habits, but most recently over the past few months she has noticed worsening constipation.  She tells me she will go 4-5 days before taking a laxative to move her bowels.  In between she does feel quite uncomfortable.  She has also had occasional "blow outs".  Patient tells me that the stools are liquid and urgent and unrelated to laxative use and she can "hardly get my pants down before it is all over the walls".  Patient describes 2 distinct episodes over the past couple of months.  She denies any recent antibiotic usage.  Associated symptoms include a chronic right lower quadrant abdominal pain.    Patient also describes fatigue which has been worse over the past 6 months.  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  She thought this fatigue was related to holiday stress.    Patient denies fever, chills, weight loss, anorexia, nausea, vomiting, heartburn, reflux or symptoms that awaken her at night.  Past Medical History:  Diagnosis Date  . Alopecia   . Anemia, mild   . Arthritis   . Chronic cough    sees pulmonologist  . Chronic pain    chest wall and  abd - s/p extensive eval  . Diabetes mellitus with neuropathy ([place])    sees endocrine  . Diverticulosis   . Fatty liver   . GERD (gastroesophageal reflux disease)    takes Nexium bid, hx erosive esophagitis  . Headache(784.0)    occasionally;r/t sinus   . History of colon polyps   . HTN (hypertension)   . Hx of amputation of lesser toe ([place])    sees podiatrist  . Hyperlipemia   . IBS (irritable bowel syndrome)   . Insomnia    takes Elavil nightly  . Joint pain   . Neuropathy   . Osteomyelitis ([place])   . Pneumonia   . PONV (postoperative nausea and vomiting)   . Seasonal allergies    takes Zyrtec daily  . Sinus tachycardia     Past Surgical History:  Procedure Laterality Date  . AMPUTATION  12/28/2011   Procedure: AMPUTATION DIGIT;  Surgeon: Newt Minion, MD;  Location: [place];  Service: Orthopedics;  Laterality: Right;  Right Foot 2nd Toe Amputation at MTP (metatarsophalangeal joint)  . AMPUTATION Right 04/25/2012   Procedure: Right Foot 3rd Toe Amputation;  Surgeon: Newt Minion, MD;  Location: [place];  Service: Orthopedics;  Laterality: Right;  Right Foot Third Toe Amputation   . AMPUTATION Right 07/27/2012   Procedure: Right 4th Toe  Amputation at Metatarsophalangeal;  Surgeon: Newt Minion, MD;  Location: [place];  Service: Orthopedics;  Laterality: Right;  Right 4th Toe Amputation at Metatarsophalangeal  . AMPUTATION Left 07/15/2016   Procedure: Left 2nd Ray Amputation;  Surgeon: Newt Minion, MD;  Location: [place];  Service: Orthopedics;  Laterality: Left;  . AMPUTATION Left 11/18/2016   Procedure: Left 3rd and 4th Ray Amputation;  Surgeon: Newt Minion, MD;  Location: [place];  Service: Orthopedics;  Laterality: Left;  . COLONOSCOPY    . LAPAROSCOPIC APPENDECTOMY  01/05/2011   Procedure: APPENDECTOMY LAPAROSCOPIC;  Surgeon: Pedro Earls, MD;  [facility];  Service: General;  Laterality: N/A;  . OOPHORECTOMY  2001  . ROTATOR CUFF REPAIR Right   . TUBAL LIGATION      . VAGINAL HYSTERECTOMY  2001    Current Outpatient Medications  Medication Sig Dispense Refill  . acetaminophen (TYLENOL) 500 MG tablet Take 500 mg by mouth every 8 (eight) hours as needed for mild pain.     . cetirizine (ZYRTEC) 10 MG tablet Take 1 tablet (10 mg total) by mouth daily. 90 tablet 1  . cholecalciferol (VITAMIN D) 1000 units tablet Take 1 tablet by mouth daily.     . Fluocinolone Acetonide (DERMOTIC) 0.01 % OIL Place 1 drop in ear(s) daily as needed (itching).     . gabapentin (NEURONTIN) 100 MG capsule TAKE 2 CAPSULES BY MOUTH AT BEDTIME 180 capsule 1  . glucose blood (ONE TOUCH ULTRA TEST) test strip USE TO TEST BLOOD SUGAR THREE TIMES DAILY AS INSTRUCTED 300 each 1  . insulin NPH Human (NOVOLIN N RELION) 100 UNIT/ML injection Inject under skin 25 units in am and 25 units at bedtime 20 mL 11  . insulin regular (NOVOLIN R RELION) 100 units/mL injection INJECT 5 to 25 UNITS INTO THE SKIN THREE TIMES DAILY BEFORE MEALS 40 mL 11  . losartan (COZAAR) 25 MG tablet TAKE 1 TABLET BY MOUTH ONCE DAILY 90 tablet 1  . lovastatin (MEVACOR) 20 MG tablet Take 1 tablet (20 mg total) by mouth at bedtime. 90 tablet 1  . metFORMIN (GLUCOPHAGE) 1000 MG tablet TAKE 1 TABLET TWICE A DAY WITH A MEAL 180 tablet 0  . metoprolol succinate (TOPROL XL) 25 MG 24 hr tablet Take 1 tablet (25 mg total) by mouth daily. 30 tablet 11  . naproxen sodium (ANAPROX) 220 MG tablet Take 220 mg by mouth daily as needed (pain).    Marland Kitchen omeprazole (PRILOSEC) 20 MG capsule Take 2 capsules (40 mg total) by mouth 2 (two) times daily before a meal. 360 capsule 3  . RELION INSULIN SYRINGE 1ML/31G 31G X 5/16" 1 ML MISC USE 2 TIMES A DAY 100 each 2  . senna (SENOKOT) 8.6 MG TABS tablet Take 1-2 tablets daily by mouth.    . silver sulfADIAZINE (SILVADENE) 1 % cream Apply 1 application daily topically. Apply to foot    . vitamin B-12 (CYANOCOBALAMIN) 1000 MCG tablet Take 1,000 mcg daily by mouth.     No current  facility-administered medications for this visit.     Allergies as of 01/13/2017 - Review Complete 12/26/2016  Allergen Reaction Noted  . Codeine Other (See Comments) 10/31/2007  . Propoxyphene hcl Itching 10/31/2007    Family History  Problem Relation Age of Onset  . Lung cancer Mother 34       smoked heavily  . Emphysema Mother   . Hypertension Father   . Hyperlipidemia Father   . Diabetes  Father   . Coronary artery disease Father   . Dementia Father   . COPD Father        smoked  . Stomach cancer Paternal Aunt   . Brain cancer Paternal Uncle   . Irritable bowel syndrome Unknown        Several family members on fathers side   . Diabetes Other   . Stomach cancer Maternal Aunt   . Colon cancer Neg Hx     Social History   Socioeconomic History  . Marital status: Married    Spouse name: Not on file  . Number of children: 2  . Years of education: Not on file  . Highest education level: Not on file  Social Needs  . Financial resource strain: Not on file  . Food insecurity - worry: Not on file  . Food insecurity - inability: Not on file  . Transportation needs - medical: Not on file  . Transportation needs - non-medical: Not on file  Occupational History  . Occupation: Retired   Tobacco Use  . Smoking status: Never Smoker  . Smokeless tobacco: Never Used  Substance and Sexual Activity  . Alcohol use: No  . Drug use: No  . Sexual activity: Yes    Birth control/protection: Surgical  Other Topics Concern  . Not on file  Social History Narrative   Caffeine daily    [employer], [employer] no diploma   Married '66   1 dtr- '78; 1 son '71; 2 grandchildren   Occupation: retired 04   Dad with alzheimers-had to place in [place] (summer '10)          Review of Systems:    Constitutional: No weight loss, fever or chills Skin: No rash  Cardiovascular: No chest pain Respiratory: No SOB  Gastrointestinal: See HPI and otherwise negative Genitourinary: No dysuria  Neurological: No  headache, dizziness or syncope Musculoskeletal: No new muscle or joint pain Hematologic: No bleeding or bruising Psychiatric: No history of depression or anxiety   Physical Exam:  Vital signs: BP 106/60   Pulse 88   Ht 5\' 4"  (1.626 m)   Wt 234 lb 9.6 oz (106.4 kg)   BMI 40.27 kg/m   Constitutional:   Pleasant obese Caucasian female appears to be in NAD, Well developed, Well nourished, alert and cooperative Head:  Normocephalic and atraumatic. Eyes:   PEERL, EOMI. No icterus. Conjunctiva pink. Ears:  Normal auditory acuity. Neck:  Supple Throat: Oral cavity and pharynx without inflammation, swelling or lesion.  Respiratory: Respirations even and unlabored. Lungs clear to auscultation bilaterally.   No wheezes, crackles, or rhonchi.  Cardiovascular: Normal S1, S2. No MRG. Regular rate and rhythm. No peripheral edema, cyanosis or pallor.  Gastrointestinal:  Soft, nondistended, nontender. No rebound or guarding. Normal bowel sounds. No appreciable masses or hepatomegaly. Rectal:  Not performed.  Msk:  Symmetrical without gross deformities. Without edema, no deformity or joint abnormality.  Neurologic:  Alert and  oriented x4;  grossly normal neurologically.  Skin:   Dry and intact without significant lesions or rashes. Psychiatric: Demonstrates good judgement and reason without abnormal affect or behaviors.  RELEVANT LABS AND IMAGING: CBC    Component Value Date/Time   WBC 13.8 (H) 12/23/2016 1658   RBC 4.29 12/23/2016 1658   HGB 9.8 (L) 12/23/2016 1658   HCT 31.2 (L) 12/23/2016 1658   PLT 467 (H) 12/23/2016 1658   MCV 72.7 (L) 12/23/2016 1658   MCH 22.8 (L) 12/23/2016 1658   MCHC 31.4 (  L) 12/23/2016 1658   RDW 15.1 (H) 12/23/2016 1658   LYMPHSABS 3,933 (H) 12/23/2016 1658   MONOABS 0.5 10/21/2013 2000   EOSABS 262 12/23/2016 1658   BASOSABS 69 12/23/2016 1658    CMP     Component Value Date/Time   NA 140 11/18/2016 0616   K 3.8 11/18/2016 0616   CL 103 11/18/2016 0616    CO2 28 11/18/2016 0616   GLUCOSE 134 (H) 11/18/2016 0616   BUN 9 11/18/2016 0616   CREATININE 0.79 11/18/2016 0616   CALCIUM 9.1 11/18/2016 0616   PROT 8.1 10/21/2013 2000   ALBUMIN 3.9 10/21/2013 2000   AST 19 10/21/2013 2000   ALT 16 10/21/2013 2000   ALKPHOS 61 10/21/2013 2000   BILITOT 0.3 10/21/2013 2000   GFRNONAA >60 11/18/2016 0616   GFRAA >60 11/18/2016 0616    Assessment: 1. Microcytic Anemia: No previous workup, Hemoccults recently negative, no prior iron studies; consider GI source of blood loss versus other 2.  IBS with constipation: Chronic for the patient, somewhat worse per her with occasional "blow outs", question these blowouts in relation to laxative use 3.  Chronic right lower quadrant pain: Uncertain etiology, question relation to constipation above  Plan: 1.  Discussed with the patient that she should start a daily laxative such as MiraLAX.  We discussed titration of this.  She can use this up to 4 times a day. 2.  Recommend the patient start a daily probiotic such as Align.  She should take 1 tab once daily for 2 months after time for colonoscopy. 3.  Patient was scheduled for an EGD and colonoscopy for further evaluation of her anemia.  She was scheduled with Dr. Fuller Plan in the [facility].  Did discuss risk, benefits, limitations and alternatives and the patient agrees to proceed.  She reports not clearing after movi prep at time of last colonoscopy. Will instruct her on two day suprep regimen. 4.  Ordered labs to include an iron panel and ferritin. 5.  Patient to follow in clinic per recommendations from Dr. Fuller Plan after time of procedures.  Ellouise Newer, PA-C [facility] 01/13/2017, 3:29 PM  Cc: Lucretia Kern, DO

## 2017-01-16 ENCOUNTER — Telehealth: Payer: Self-pay | Admitting: Hematology and Oncology

## 2017-01-16 ENCOUNTER — Encounter (INDEPENDENT_AMBULATORY_CARE_PROVIDER_SITE_OTHER): Payer: Self-pay | Admitting: Orthopedic Surgery

## 2017-01-16 ENCOUNTER — Ambulatory Visit (INDEPENDENT_AMBULATORY_CARE_PROVIDER_SITE_OTHER): Payer: Medicare Other | Admitting: Orthopedic Surgery

## 2017-01-16 VITALS — Ht 64.0 in | Wt 234.0 lb

## 2017-01-16 DIAGNOSIS — L97511 Non-pressure chronic ulcer of other part of right foot limited to breakdown of skin: Secondary | ICD-10-CM

## 2017-01-16 DIAGNOSIS — Z89422 Acquired absence of other left toe(s): Secondary | ICD-10-CM

## 2017-01-16 NOTE — Progress Notes (Signed)
Office Visit Note   [name]           Date of Birth: November 03, 1945           MRN: [id number] Visit Date: 01/16/2017              Requested by: Lucretia Kern, DO [address] [place], [place] [address] PCP: Lucretia Kern, DO  Chief Complaint  Patient presents with  . Left Foot - Routine Post Op    11/28/16 left 3rd ray amputation       HPI: Patient presents in follow-up status post left foot third and fourth ray amputations and a chronic ulcer beneath the fourth metatarsal head right foot.  She has increased callus on the right.  Assessment & Plan: Visit Diagnoses:  1. H/O amputation of lesser toe, left ([place])   2. Non-pressure chronic ulcer of other part of right foot limited to breakdown of skin ([place])     Plan: Ulcer was debrided of skin and soft tissue in the right the ulcer is 2 cm in diameter and 1 mm deep she is showing good improvement continue with the felt relieving donut.  Follow-Up Instructions: Return in about 4 weeks (around 02/13/2017).   Ortho Exam  Patient is alert, oriented, no adenopathy, well-dressed, normal affect, normal respiratory effort. Examination the left foot has healed nicely she still has some swelling.  She will continue wearing compression stockings.  Right foot has a Waggoner grade 1 ulcer with hypertrophic callus after informed consent a 10 blade knife was used to debride the skin and soft tissue.  Plan to follow-up in 4 weeks.  Imaging: No results found. No images are attached to the encounter.  Labs: Lab Results  Component Value Date   HGBA1C 7.3 12/26/2016   HGBA1C 7.0 09/19/2016   HGBA1C 7.6 06/20/2016   LABURIC 5.5 09/01/2015   LABORGA GROUP B STREP (S.AGALACTIAE) ISOLATED 12/26/2014    @LABSALLVALUES (HGBA1)@  Body mass index is 40.17 kg/m.  Orders:  No orders of the defined types were placed in this encounter.  No orders of the defined types were placed in this encounter.    Procedures: No  procedures performed  Clinical Data: No additional findings.  ROS:  All other systems negative, except as noted in the HPI. Review of Systems  Objective: Vital Signs: Ht 5\' 4"  (1.626 m)   Wt 234 lb (106.1 kg)   BMI 40.17 kg/m   Specialty Comments:  No specialty comments available.  PMFS History: Patient Active Problem List   Diagnosis Date Noted  . Right foot ulcer, limited to breakdown of skin ([place]) 09/26/2016  . Osteomyelitis of toe of left foot ([place])   . Baker's cyst 07/10/2016  . Tachycardia 03/04/2016  . Onychomycosis 12/07/2015  . Non-pressure chronic ulcer of other part of right foot limited to breakdown of skin ([place]) 12/07/2015  . Upper airway cough syndrome 03/05/2014  . S/P amputation of lesser toe - followed by Dr. Sharol Given 04/15/2013  . Pain in joint, shoulder region, s/p RTC surgery - followed by Dr. Sharol Given 04/15/2013  . ANEMIA, MILD 02/04/2010  . Essential hypertension 12/05/2006  . Uncontrolled type 2 diabetes mellitus with peripheral circulatory disorder ([place]) 10/12/2006  . Hyperlipidemia 10/12/2006  . Allergic rhinitis 10/12/2006  . GERD - Followed by Dr. Fuller Plan 10/12/2006  . IRRITABLE BOWEL SYNDROME - followed by Dr. Fuller Plan 10/12/2006  . Peripheral neuropathy 10/11/2006  . ALOPECIA NEC 10/11/2006   Past Medical History:  Diagnosis Date  .  Alopecia   . Anemia, mild   . Arthritis   . Chronic cough    sees pulmonologist  . Chronic pain    chest wall and abd - s/p extensive eval  . Diabetes mellitus with neuropathy ([place])    sees endocrine  . Diverticulosis   . Fatty liver   . GERD (gastroesophageal reflux disease)    takes Nexium bid, hx erosive esophagitis  . Headache(784.0)    occasionally;r/t sinus   . History of colon polyps   . HTN (hypertension)   . Hx of amputation of lesser toe ([place])    sees podiatrist  . Hyperlipemia   . IBS (irritable bowel syndrome)   . Insomnia    takes Elavil nightly  . Joint pain   . Neuropathy   . Osteomyelitis  ([place])   . Pneumonia   . PONV (postoperative nausea and vomiting)   . Seasonal allergies    takes Zyrtec daily  . Sinus tachycardia     Family History  Problem Relation Age of Onset  . Lung cancer Mother 47       smoked heavily  . Emphysema Mother   . Hypertension Father   . Hyperlipidemia Father   . Diabetes Father   . Coronary artery disease Father   . Dementia Father   . COPD Father        smoked  . Stomach cancer Paternal Aunt   . Brain cancer Paternal Uncle   . Irritable bowel syndrome Unknown        Several family members on fathers side   . Diabetes Other   . Stomach cancer Maternal Aunt   . Lung cancer Paternal Uncle   . Heart disease Paternal Uncle   . Colon cancer Neg Hx     Past Surgical History:  Procedure Laterality Date  . AMPUTATION  12/28/2011   Procedure: AMPUTATION DIGIT;  Surgeon: Newt Minion, MD;  Location: [place];  Service: Orthopedics;  Laterality: Right;  Right Foot 2nd Toe Amputation at MTP (metatarsophalangeal joint)  . AMPUTATION Right 04/25/2012   Procedure: Right Foot 3rd Toe Amputation;  Surgeon: Newt Minion, MD;  Location: [place];  Service: Orthopedics;  Laterality: Right;  Right Foot Third Toe Amputation   . AMPUTATION Right 07/27/2012   Procedure: Right 4th Toe Amputation at Metatarsophalangeal;  Surgeon: Newt Minion, MD;  Location: [place];  Service: Orthopedics;  Laterality: Right;  Right 4th Toe Amputation at Metatarsophalangeal  . AMPUTATION Left 07/15/2016   Procedure: Left 2nd Ray Amputation;  Surgeon: Newt Minion, MD;  Location: [place];  Service: Orthopedics;  Laterality: Left;  . AMPUTATION Left 11/18/2016   Procedure: Left 3rd and 4th Ray Amputation;  Surgeon: Newt Minion, MD;  Location: [place];  Service: Orthopedics;  Laterality: Left;  . COLONOSCOPY    . LAPAROSCOPIC APPENDECTOMY  01/05/2011   Procedure: APPENDECTOMY LAPAROSCOPIC;  Surgeon: Pedro Earls, MD;  [facility];  Service: General;  Laterality: N/A;  .  OOPHORECTOMY  2001  . ROTATOR CUFF REPAIR Right    x 2  . TUBAL LIGATION    . VAGINAL HYSTERECTOMY  2001   Social History   Occupational History  . Occupation: Retired   Tobacco Use  . Smoking status: Never Smoker  . Smokeless tobacco: Never Used  Substance and Sexual Activity  . Alcohol use: No  . Drug use: No  . Sexual activity: Yes    Birth control/protection: Surgical

## 2017-01-16 NOTE — Telephone Encounter (Signed)
Spoke with patient regarding upcoming appointment D/T/Loc/PH#

## 2017-01-19 ENCOUNTER — Encounter: Payer: Self-pay | Admitting: Family Medicine

## 2017-01-19 ENCOUNTER — Ambulatory Visit: Payer: Self-pay

## 2017-01-19 NOTE — Telephone Encounter (Signed)
Called patient at [phone] and [phone] to discuss her call into the office with "red bumps", left VM to call the office back to discuss her symptoms.

## 2017-01-19 NOTE — Telephone Encounter (Signed)
Patient called in with c/o "red, bumps that are itching." She states "I've had these red rash areas that's been coming up for years and I have this cream that the skin specialist gave me to use, but I am out. Maybe I should try to call her to see about coming in for an appointment." I asked if she could describe if the areas are raised, open, weaping, she denies weaping and open areas, but says there are raised and on her legs. She says she has never had this many before and they itch and burn. She denies fever. According to the protocol, see PCP within 3 days, she stated "I will just call the dermatologist and make an appointment to see her, since she knows about it and prescribed the cream; she usually will see me quick." I advised that she can call back if she is unable to get an appointment and an appointment will be made with her PCP, she verbalized understanding.   Reason for Disposition . Localized rash present > 7 days  Answer Assessment - Initial Assessment Questions 1. APPEARANCE of RASH: "Describe the rash."      Red, raised, tiny 2. LOCATION: "Where is the rash located?"      Both legs 3. NUMBER: "How many spots are there?"      Maybe 10-12 over my body 4. SIZE: "How big are the spots?" (Inches, centimeters or compare to size of a coin)      Smaller than a pea 5. ONSET: "When did the rash start?"      Have all the time, but not this many 6. ITCHING: "Does the rash itch?" If so, ask: "How bad is the itch?"  (Scale 1-10; or mild, moderate, severe)     Yes-moderate-burns 7. PAIN: "Does the rash hurt?" If so, ask: "How bad is the pain?"  (Scale 1-10; or mild, moderate, severe)     Denies 8. OTHER SYMPTOMS: "Do you have any other symptoms?" (e.g., fever)     Burning 9. PREGNANCY: "Is there any chance you are pregnant?" "When was your last menstrual period?"     No  Protocols used: RASH OR REDNESS - LOCALIZED-A-AH

## 2017-01-23 DIAGNOSIS — L309 Dermatitis, unspecified: Secondary | ICD-10-CM | POA: Diagnosis not present

## 2017-01-27 ENCOUNTER — Encounter: Payer: Self-pay | Admitting: Internal Medicine

## 2017-01-27 ENCOUNTER — Encounter: Payer: Self-pay | Admitting: Family Medicine

## 2017-01-27 ENCOUNTER — Telehealth: Payer: Self-pay | Admitting: Internal Medicine

## 2017-01-27 MED ORDER — LOSARTAN POTASSIUM 25 MG PO TABS: 25.0000 mg | ORAL_TABLET | Freq: Every day | ORAL | 1 refills | Status: DC

## 2017-01-27 MED ORDER — LOVASTATIN 20 MG PO TABS: 20.0000 mg | ORAL_TABLET | Freq: Every day | ORAL | 1 refills | Status: DC

## 2017-01-27 MED ORDER — "INSULIN SYRINGE-NEEDLE U-100 31G X 5/16"" 1 ML MISC": 3 refills | Status: DC

## 2017-01-27 MED ORDER — METFORMIN HCL 1000 MG PO TABS: ORAL_TABLET | ORAL | 1 refills | Status: DC

## 2017-01-27 MED ORDER — GABAPENTIN 100 MG PO CAPS: ORAL_CAPSULE | ORAL | 1 refills | Status: DC

## 2017-01-27 MED ORDER — CETIRIZINE HCL 10 MG PO TABS: 10.0000 mg | ORAL_TABLET | Freq: Every day | ORAL | 1 refills | Status: DC

## 2017-01-27 MED ORDER — INSULIN NPH (HUMAN) (ISOPHANE) 100 UNIT/ML ~~LOC~~ SUSP: SUBCUTANEOUS | 3 refills | Status: DC

## 2017-01-27 MED ORDER — INSULIN REGULAR HUMAN 100 UNIT/ML IJ SOLN: INTRAMUSCULAR | 3 refills | Status: DC

## 2017-01-27 NOTE — Telephone Encounter (Signed)
Pt needed her medications sent to [facility], and pt stated she will call back if insulin is to expensive with [facility] so that we can continue sending insulin through [facility]. She will call back today to let us know or send a mychart message

## 2017-01-27 NOTE — Telephone Encounter (Signed)
Dr. Maudie Mercury, last lipid for this pt was 11/30/15.  Please advise.

## 2017-01-27 NOTE — Telephone Encounter (Signed)
Patient asked you to call her concerning her medication supply's. Please advise

## 2017-01-27 NOTE — Telephone Encounter (Signed)
Sent to the pharmacy by e-scribe. 

## 2017-02-06 ENCOUNTER — Ambulatory Visit: Payer: Medicare Other | Admitting: Gastroenterology

## 2017-02-15 ENCOUNTER — Encounter: Payer: Self-pay | Admitting: Gastroenterology

## 2017-02-15 ENCOUNTER — Other Ambulatory Visit: Payer: Self-pay

## 2017-02-15 ENCOUNTER — Ambulatory Visit (AMBULATORY_SURGERY_CENTER): Payer: Medicare Other | Admitting: Gastroenterology

## 2017-02-15 VITALS — BP 143/74 | HR 79 | Temp 98.4°F | Resp 19 | Ht 64.0 in | Wt 234.0 lb

## 2017-02-15 DIAGNOSIS — K3189 Other diseases of stomach and duodenum: Secondary | ICD-10-CM | POA: Diagnosis not present

## 2017-02-15 DIAGNOSIS — D509 Iron deficiency anemia, unspecified: Secondary | ICD-10-CM

## 2017-02-15 DIAGNOSIS — K2970 Gastritis, unspecified, without bleeding: Secondary | ICD-10-CM

## 2017-02-15 DIAGNOSIS — K2990 Gastroduodenitis, unspecified, without bleeding: Secondary | ICD-10-CM

## 2017-02-15 DIAGNOSIS — E119 Type 2 diabetes mellitus without complications: Secondary | ICD-10-CM | POA: Diagnosis not present

## 2017-02-15 DIAGNOSIS — D123 Benign neoplasm of transverse colon: Secondary | ICD-10-CM

## 2017-02-15 DIAGNOSIS — I10 Essential (primary) hypertension: Secondary | ICD-10-CM | POA: Diagnosis not present

## 2017-02-15 DIAGNOSIS — K219 Gastro-esophageal reflux disease without esophagitis: Secondary | ICD-10-CM | POA: Diagnosis not present

## 2017-02-15 MED ORDER — SODIUM CHLORIDE 0.9 % IV SOLN: 500.0000 mL | Freq: Once | INTRAVENOUS | Status: DC

## 2017-02-15 NOTE — Progress Notes (Signed)
Pt's states no medical or surgical changes since previsit or office visit. 

## 2017-02-15 NOTE — Progress Notes (Signed)
Called to room to assist during endoscopic procedure.  Patient ID and intended procedure confirmed with present staff. Received instructions for my participation in the procedure from the performing physician.  

## 2017-02-15 NOTE — Op Note (Addendum)
[facility] Patient Name: Beth Holden Procedure Date: 02/15/2017 10:31 AM MRN: [id number] Endoscopist: Ladene Artist , MD Age: 72 Referring MD:  Date of Birth: 06-10-1945 Gender: Female Account #: [id number] Procedure:                Colonoscopy Indications:              Iron deficiency anemia, personal history of                            adenomatous colon polyps Medicines:                Monitored Anesthesia Care Procedure:                Pre-Anesthesia Assessment:                           - Prior to the procedure, a History and Physical                            was performed, and patient medications and                            allergies were reviewed. The patient's tolerance of                            previous anesthesia was also reviewed. The risks                            and benefits of the procedure and the sedation                            options and risks were discussed with the patient.                            All questions were answered, and informed consent                            was obtained. Prior Anticoagulants: The patient has                            taken no previous anticoagulant or antiplatelet                            agents. ASA Grade Assessment: III - A patient with                            severe systemic disease. After reviewing the risks                            and benefits, the patient was deemed in                            satisfactory condition to undergo the procedure.  After obtaining informed consent, the colonoscope                            was passed under direct vision. Throughout the                            procedure, the patient's blood pressure, pulse, and                            oxygen saturations were monitored continuously. The                            Model GIF-HQ190 [id number]) scope was introduced                            through the anus and advanced to the  the ascending                            colon. The ileocecal valve and the rectum were                            photographed. The quality of the bowel preparation                            was good. The patient tolerated the procedure well.                            The colonoscopy was somewhat difficult due to                            restricted mobility of the sigmoid colon and a                            tortuous sigmoid colon. Unable to advance the peds                            colonoscope around the sigmoid colon. Successful                            completion of the procedure was aided by changing                            the patient's position, using manual pressure,                            withdrawing and reinserting the scope, withdrawing                            the scope and replacing with the adult endoscope                            and straightening and shortening the scope to  obtain bowel loop reduction. Scope In: 10:47:59 AM Scope Out: 95:09:32 AM Total Procedure Duration: 0 hours 18 minutes 8 seconds  Findings:                 The perianal and digital rectal examinations were                            normal.                           A 6 mm polyp was found in the transverse colon. The                            polyp was sessile. The polyp was removed with a                            cold snare. Resection and retrieval were complete.                           The exam was otherwise without abnormality on                            direct and retroflexion views to the ascending                            colon. Complications:            No immediate complications. Estimated blood loss:                            None. Estimated Blood Loss:     Estimated blood loss: none. Impression:               - One 6 mm polyp in the transverse colon, removed                            with a cold snare. Resected and retrieved.                            - The examination was otherwise normal on direct                            and retroflexion views to the ascending colon. Recommendation:           - Repeat colonoscopy in 5 years for surveillance.                           - Patient has a contact number available for                            emergencies. The signs and symptoms of potential                            delayed complications were discussed with the  patient. Return to normal activities tomorrow.                            Written discharge instructions were provided to the                            patient.                           - Resume previous diet.                           - Continue present medications.                           - Await pathology results.                           - Consider ACBE to evaluate cecum after colon and                            egd pathology review Ladene Artist, MD 02/15/2017 11:25:59 AM This report has been signed electronically.

## 2017-02-15 NOTE — Op Note (Signed)
[place] Patient Name: Beth Holden Procedure Date: 02/15/2017 10:30 AM MRN: [id number] Endoscopist: Ladene Artist , MD Age: 72 Referring MD:  Date of Birth: [birth year] Gender: Female Account #: [id number] Procedure:                Upper GI endoscopy Indications:              Iron deficiency anemia Medicines:                Monitored Anesthesia Care Procedure:                Pre-Anesthesia Assessment:                           - Prior to the procedure, a History and Physical                            was performed, and patient medications and                            allergies were reviewed. The patient's tolerance of                            previous anesthesia was also reviewed. The risks                            and benefits of the procedure and the sedation                            options and risks were discussed with the patient.                            All questions were answered, and informed consent                            was obtained. Prior Anticoagulants: The patient has                            taken no previous anticoagulant or antiplatelet                            agents. ASA Grade Assessment: III - A patient with                            severe systemic disease. After reviewing the risks                            and benefits, the patient was deemed in                            satisfactory condition to undergo the procedure.                           After obtaining informed consent, the endoscope was  passed under direct vision. Throughout the                            procedure, the patient's blood pressure, pulse, and                            oxygen saturations were monitored continuously. The                            Endoscope was introduced through the mouth, and                            advanced to the second part of duodenum. The upper                            GI endoscopy was  accomplished without difficulty.                            The patient tolerated the procedure well. Scope In: Scope Out: Findings:                 The examined esophagus was normal.                           Diffuse moderate inflammation characterized by                            congestion (edema), erythema, friability and                            granularity was found in the gastric body, on the                            posterior wall of the stomach and in the gastric                            antrum. Biopsies were taken with a cold forceps for                            histology.                           The exam of the stomach was otherwise normal.                           The duodenal bulb and second portion of the                            duodenum were normal. Biopsies for histology were                            taken with a cold forceps for evaluation of celiac  disease. Complications:            No immediate complications. Estimated Blood Loss:     Estimated blood loss was minimal. Impression:               - Normal esophagus.                           - Gastritis. Biopsied.                           - Normal duodenal bulb and second portion of the                            duodenum. Biopsied. Recommendation:           - Patient has a contact number available for                            emergencies. The signs and symptoms of potential                            delayed complications were discussed with the                            patient. Return to normal activities tomorrow.                            Written discharge instructions were provided to the                            patient.                           - Resume previous diet.                           - Continue present medications.                           - Await pathology results. Ladene Artist, MD 02/15/2017 11:30:58 AM This report has been signed  electronically.

## 2017-02-15 NOTE — Patient Instructions (Signed)
Handouts given for Gastritis and Polyps.   YOU HAD AN ENDOSCOPIC PROCEDURE TODAY AT [facility]:   Refer to the procedure report that was given to you for any specific questions about what was found during the examination.  If the procedure report does not answer your questions, please call your gastroenterologist to clarify.  If you requested that your care partner not be given the details of your procedure findings, then the procedure report has been included in a sealed envelope for you to review at your convenience later.  YOU SHOULD EXPECT: Some feelings of bloating in the abdomen. Passage of more gas than usual.  Walking can help get rid of the air that was put into your GI tract during the procedure and reduce the bloating. If you had a lower endoscopy (such as a colonoscopy or flexible sigmoidoscopy) you may notice spotting of blood in your stool or on the toilet paper. If you underwent a bowel prep for your procedure, you may not have a normal bowel movement for a few days.  Please Note:  You might notice some irritation and congestion in your nose or some drainage.  This is from the oxygen used during your procedure.  There is no need for concern and it should clear up in a day or so.  SYMPTOMS TO REPORT IMMEDIATELY:   Following lower endoscopy (colonoscopy or flexible sigmoidoscopy):  Excessive amounts of blood in the stool  Significant tenderness or worsening of abdominal pains  Swelling of the abdomen that is new, acute  Fever of 100F or higher   Following upper endoscopy (EGD)  Vomiting of blood or coffee ground material  New chest pain or pain under the shoulder blades  Painful or persistently difficult swallowing  New shortness of breath  Fever of 100F or higher  Black, tarry-looking stools  For urgent or emergent issues, a gastroenterologist can be reached at any hour by calling [phone].   DIET:  We do recommend a small meal at first, but then  you may proceed to your regular diet.  Drink plenty of fluids but you should avoid alcoholic beverages for 24 hours.  ACTIVITY:  You should plan to take it easy for the rest of today and you should NOT DRIVE or use heavy machinery until tomorrow (because of the sedation medicines used during the test).    FOLLOW UP: Our staff will call the number listed on your records the next business day following your procedure to check on you and address any questions or concerns that you may have regarding the information given to you following your procedure. If we do not reach you, we will leave a message.  However, if you are feeling well and you are not experiencing any problems, there is no need to return our call.  We will assume that you have returned to your regular daily activities without incident.  If any biopsies were taken you will be contacted by phone or by letter within the next 1-3 weeks.  Please call us at [phone] if you have not heard about the biopsies in 3 weeks.    SIGNATURES/CONFIDENTIALITY: You and/or your care partner have signed paperwork which will be entered into your electronic medical record.  These signatures attest to the fact that that the information above on your After Visit Summary has been reviewed and is understood.  Full responsibility of the confidentiality of this discharge information lies with you and/or your care-partner.

## 2017-02-15 NOTE — Progress Notes (Signed)
Report to PACU, RN, vss, BBS= Clear.  

## 2017-02-16 ENCOUNTER — Telehealth: Payer: Self-pay

## 2017-02-16 ENCOUNTER — Encounter: Payer: Medicare Other | Admitting: Hematology and Oncology

## 2017-02-16 NOTE — Telephone Encounter (Signed)
  Follow up Call-  Call back number 02/15/2017  Post procedure Call Back phone  # [phone]  Permission to leave phone message Yes  Some recent data might be hidden     Patient questions:  Do you have a fever, pain , or abdominal swelling? No. Pain Score  0 *  Have you tolerated food without any problems? Yes.    Have you been able to return to your normal activities? Yes.    Do you have any questions about your discharge instructions: Diet   No. Medications  No. Follow up visit  No.  Do you have questions or concerns about your Care? No.  Actions: * If pain score is 4 or above: No action needed, pain <4.

## 2017-02-20 ENCOUNTER — Encounter (INDEPENDENT_AMBULATORY_CARE_PROVIDER_SITE_OTHER): Payer: Self-pay | Admitting: Orthopedic Surgery

## 2017-02-20 ENCOUNTER — Ambulatory Visit (INDEPENDENT_AMBULATORY_CARE_PROVIDER_SITE_OTHER): Payer: Medicare Other | Admitting: Orthopedic Surgery

## 2017-02-20 VITALS — Ht 64.0 in | Wt 234.0 lb

## 2017-02-20 DIAGNOSIS — L97511 Non-pressure chronic ulcer of other part of right foot limited to breakdown of skin: Secondary | ICD-10-CM

## 2017-02-20 DIAGNOSIS — B351 Tinea unguium: Secondary | ICD-10-CM | POA: Diagnosis not present

## 2017-02-20 MED ORDER — DOXYCYCLINE HYCLATE 100 MG PO TABS: 100.0000 mg | ORAL_TABLET | Freq: Two times a day (BID) | ORAL | 0 refills | Status: DC

## 2017-02-20 NOTE — Progress Notes (Signed)
Office Visit Note   [name]           Date of Birth: [birth year]           MRN: [id number] Visit Date: 02/20/2017              Requested by: Lucretia Kern, DO [address] [place], [place] [address] PCP: Lucretia Kern, DO  Chief Complaint  Patient presents with  . Right Foot - Follow-up, Pain, Wound Check  . Left Foot - Pain, Follow-up    11/28/16 Left 3rd ray amputation.      HPI: Patient is a 72 year old woman who is status post second third and fourth toe amputations on both feet who has had a chronic Waggoner grade 1 ulcer beneath the second third and fourth metatarsal heads of the right foot.  She states she is started having increasing pain and feels like the ulcer is larger.  She also complains of painful onychomycotic nails x4.  She has been using a felt relieving donut beneath the ulcer on the right foot.  Patient states that with her anemia that she is being worked up for a GI bleed she is going to undergo endoscopy and colonoscopy and has a appointment with the hematologist to evaluate her anemia.  Assessment & Plan: Visit Diagnoses:  1. Non-pressure chronic ulcer of other part of right foot limited to breakdown of skin ([place])   2. Onychomycosis     Plan: We will started on some doxycycline on concerned that she may have some deep bone infection due to the ulcer getting deeper and more painful.  She will try to minimize her weightbearing  Follow-Up Instructions: Return in about 2 weeks (around 03/06/2017).   Ortho Exam  Patient is alert, oriented, no adenopathy, well-dressed, normal affect, normal respiratory effort. Examination patient has a palpable dorsalis pedis pulse bilaterally.  She has thickened discolored onychomycotic nails x4 on both feet there is no paronychial infection no drainage.  The nails were trimmed x4 without complications.  Semination the right forefoot ulcer beneath the third and fourth metatarsal heads the ulcer is deeper.   After informed consent a 10 blade knife was used to debride the skin and soft tissue back to healthy viable granulation tissue this was touched with silver nitrate the ulcer is 2 cm diameter 5 mm deep while this does probe down to the fascial tissue it does not probe down to bone there is no odor no drainage no cellulitis but due to the increased depth of the wound I am concerned that she may have some deep underlying osteomyelitis.  Previous radiographs showed no destructive bony changes.  Imaging: No results found. No images are attached to the encounter.  Labs: Lab Results  Component Value Date   HGBA1C 7.3 12/26/2016   HGBA1C 7.0 09/19/2016   HGBA1C 7.6 06/20/2016   LABURIC 5.5 09/01/2015   LABORGA GROUP B STREP (S.AGALACTIAE) ISOLATED 12/26/2014    @LABSALLVALUES (HGBA1)@  Body mass index is 40.17 kg/m.  Orders:  No orders of the defined types were placed in this encounter.  Meds ordered this encounter  Medications  . doxycycline (VIBRA-TABS) 100 MG tablet    Sig: Take 1 tablet (100 mg total) by mouth 2 (two) times daily.    Dispense:  60 tablet    Refill:  0     Procedures: No procedures performed  Clinical Data: No additional findings.  ROS:  All other systems negative, except as noted in the  HPI. Review of Systems  Objective: Vital Signs: Ht 5\' 4"  (1.626 m)   Wt 234 lb (106.1 kg)   BMI 40.17 kg/m   Specialty Comments:  No specialty comments available.  PMFS History: Patient Active Problem List   Diagnosis Date Noted  . Right foot ulcer, limited to breakdown of skin ([place]) 09/26/2016  . Osteomyelitis of toe of left foot ([place])   . Baker's cyst 07/10/2016  . Tachycardia 03/04/2016  . Onychomycosis 12/07/2015  . Non-pressure chronic ulcer of other part of right foot limited to breakdown of skin ([place]) 12/07/2015  . Upper airway cough syndrome 03/05/2014  . S/P amputation of lesser toe - followed by Dr. Sharol Given 04/15/2013  . Pain in joint, shoulder region,  s/p RTC surgery - followed by Dr. Sharol Given 04/15/2013  . ANEMIA, MILD 02/04/2010  . Essential hypertension 12/05/2006  . Uncontrolled type 2 diabetes mellitus with peripheral circulatory disorder ([place]) 10/12/2006  . Hyperlipidemia 10/12/2006  . Allergic rhinitis 10/12/2006  . GERD - Followed by Dr. Fuller Plan 10/12/2006  . IRRITABLE BOWEL SYNDROME - followed by Dr. Fuller Plan 10/12/2006  . Peripheral neuropathy 10/11/2006  . ALOPECIA NEC 10/11/2006   Past Medical History:  Diagnosis Date  . Alopecia   . Anemia, mild   . Arthritis   . Chronic cough    sees pulmonologist  . Chronic pain    chest wall and abd - s/p extensive eval  . Diabetes mellitus with neuropathy ([place])    sees endocrine  . Diverticulosis   . Fatty liver   . GERD (gastroesophageal reflux disease)    takes Nexium bid, hx erosive esophagitis  . Headache(784.0)    occasionally;r/t sinus   . History of colon polyps   . HTN (hypertension)   . Hx of amputation of lesser toe ([place])    sees podiatrist  . Hyperlipemia   . IBS (irritable bowel syndrome)   . Insomnia    takes Elavil nightly  . Joint pain   . Neuropathy   . Osteomyelitis ([place])   . Pneumonia   . PONV (postoperative nausea and vomiting)   . Seasonal allergies    takes Zyrtec daily  . Sinus tachycardia     Family History  Problem Relation Age of Onset  . Lung cancer Mother 71       smoked heavily  . Emphysema Mother   . Hypertension Father   . Hyperlipidemia Father   . Diabetes Father   . Coronary artery disease Father   . Dementia Father   . COPD Father        smoked  . Stomach cancer Paternal Aunt   . Brain cancer Paternal Uncle   . Irritable bowel syndrome Unknown        Several family members on fathers side   . Diabetes Other   . Stomach cancer Maternal Aunt   . Lung cancer Paternal Uncle   . Heart disease Paternal Uncle   . Colon cancer Neg Hx     Past Surgical History:  Procedure Laterality Date  . AMPUTATION  12/28/2011   Procedure:  AMPUTATION DIGIT;  Surgeon: Newt Minion, MD;  Location: [facility];  Service: Orthopedics;  Laterality: Right;  Right Foot 2nd Toe Amputation at MTP (metatarsophalangeal joint)  . AMPUTATION Right 04/25/2012   Procedure: Right Foot 3rd Toe Amputation;  Surgeon: Newt Minion, MD;  Location: [facility];  Service: Orthopedics;  Laterality: Right;  Right Foot Third Toe Amputation   . AMPUTATION Right 07/27/2012  Procedure: Right 4th Toe Amputation at Metatarsophalangeal;  Surgeon: Newt Minion, MD;  Location: [facility];  Service: Orthopedics;  Laterality: Right;  Right 4th Toe Amputation at Metatarsophalangeal  . AMPUTATION Left 07/15/2016   Procedure: Left 2nd Ray Amputation;  Surgeon: Newt Minion, MD;  Location: [facility];  Service: Orthopedics;  Laterality: Left;  . AMPUTATION Left 11/18/2016   Procedure: Left 3rd and 4th Ray Amputation;  Surgeon: Newt Minion, MD;  Location: [facility];  Service: Orthopedics;  Laterality: Left;  . COLONOSCOPY    . LAPAROSCOPIC APPENDECTOMY  01/05/2011   Procedure: APPENDECTOMY LAPAROSCOPIC;  Surgeon: Pedro Earls, MD;  Location: [facility];  Service: General;  Laterality: N/A;  . OOPHORECTOMY  2001  . ROTATOR CUFF REPAIR Right    x 2  . TUBAL LIGATION    . VAGINAL HYSTERECTOMY  2001   Social History   Occupational History  . Occupation: Retired   Tobacco Use  . Smoking status: Never Smoker  . Smokeless tobacco: Never Used  Substance and Sexual Activity  . Alcohol use: No  . Drug use: No  . Sexual activity: Yes    Birth control/protection: Surgical

## 2017-02-21 ENCOUNTER — Other Ambulatory Visit: Payer: Self-pay | Admitting: Internal Medicine

## 2017-02-24 ENCOUNTER — Encounter: Payer: Self-pay | Admitting: Hematology and Oncology

## 2017-02-24 ENCOUNTER — Telehealth: Payer: Self-pay

## 2017-02-24 ENCOUNTER — Inpatient Hospital Stay: Payer: Medicare Other

## 2017-02-24 ENCOUNTER — Other Ambulatory Visit: Payer: Self-pay

## 2017-02-24 ENCOUNTER — Encounter: Payer: Self-pay | Admitting: Family Medicine

## 2017-02-24 ENCOUNTER — Ambulatory Visit (INDEPENDENT_AMBULATORY_CARE_PROVIDER_SITE_OTHER): Payer: Medicare Other | Admitting: Family Medicine

## 2017-02-24 ENCOUNTER — Inpatient Hospital Stay: Payer: Medicare Other | Attending: Hematology and Oncology | Admitting: Hematology and Oncology

## 2017-02-24 VITALS — BP 133/74 | HR 96 | Temp 98.0°F | Resp 12 | Ht 64.0 in | Wt 230.0 lb

## 2017-02-24 VITALS — BP 152/74 | HR 91 | Temp 97.9°F | Resp 16 | Ht 64.0 in | Wt 230.5 lb

## 2017-02-24 DIAGNOSIS — R109 Unspecified abdominal pain: Secondary | ICD-10-CM

## 2017-02-24 DIAGNOSIS — K589 Irritable bowel syndrome without diarrhea: Secondary | ICD-10-CM | POA: Diagnosis not present

## 2017-02-24 DIAGNOSIS — J45909 Unspecified asthma, uncomplicated: Secondary | ICD-10-CM | POA: Diagnosis not present

## 2017-02-24 DIAGNOSIS — E1151 Type 2 diabetes mellitus with diabetic peripheral angiopathy without gangrene: Secondary | ICD-10-CM

## 2017-02-24 DIAGNOSIS — D649 Anemia, unspecified: Secondary | ICD-10-CM

## 2017-02-24 DIAGNOSIS — I10 Essential (primary) hypertension: Secondary | ICD-10-CM

## 2017-02-24 DIAGNOSIS — IMO0002 Reserved for concepts with insufficient information to code with codable children: Secondary | ICD-10-CM

## 2017-02-24 DIAGNOSIS — G8929 Other chronic pain: Secondary | ICD-10-CM | POA: Insufficient documentation

## 2017-02-24 DIAGNOSIS — E1165 Type 2 diabetes mellitus with hyperglycemia: Secondary | ICD-10-CM | POA: Diagnosis not present

## 2017-02-24 DIAGNOSIS — Z86010 Personal history of colonic polyps: Secondary | ICD-10-CM | POA: Diagnosis not present

## 2017-02-24 DIAGNOSIS — J309 Allergic rhinitis, unspecified: Secondary | ICD-10-CM

## 2017-02-24 DIAGNOSIS — R05 Cough: Secondary | ICD-10-CM

## 2017-02-24 DIAGNOSIS — E1140 Type 2 diabetes mellitus with diabetic neuropathy, unspecified: Secondary | ICD-10-CM

## 2017-02-24 DIAGNOSIS — R059 Cough, unspecified: Secondary | ICD-10-CM

## 2017-02-24 DIAGNOSIS — D509 Iron deficiency anemia, unspecified: Secondary | ICD-10-CM | POA: Diagnosis not present

## 2017-02-24 LAB — IRON AND TIBC
Iron: 24 ug/dL — ABNORMAL LOW (ref 28–170)
Saturation Ratios: 6 % — ABNORMAL LOW (ref 10.4–31.8)
TIBC: 384 ug/dL (ref 250–450)
UIBC: 360 ug/dL

## 2017-02-24 LAB — CMP (CANCER CENTER ONLY)
ALT: 14 U/L (ref 0–55)
AST: 21 U/L (ref 5–34)
Albumin: 3.6 g/dL (ref 3.5–5.0)
Alkaline Phosphatase: 60 U/L (ref 40–150)
Anion gap: 12 — ABNORMAL HIGH (ref 3–11)
BUN: 18 mg/dL (ref 7–26)
CO2: 26 mmol/L (ref 22–29)
Calcium: 9.8 mg/dL (ref 8.4–10.4)
Chloride: 103 mmol/L (ref 98–109)
Creatinine: 1.14 mg/dL — ABNORMAL HIGH (ref 0.60–1.10)
GFR, Est AFR Am: 56 mL/min — ABNORMAL LOW (ref >60)
GFR, Estimated: 48 mL/min — ABNORMAL LOW (ref >60)
Glucose, Bld: 164 mg/dL — ABNORMAL HIGH (ref 70–140)
Potassium: 4.1 mmol/L (ref 3.5–5.1)
Sodium: 141 mmol/L (ref 136–145)
Total Bilirubin: <0.2 mg/dL — ABNORMAL LOW (ref 0.2–1.2)
Total Protein: 7.5 g/dL (ref 6.4–8.3)

## 2017-02-24 LAB — CBC WITH DIFFERENTIAL (CANCER CENTER ONLY)
Basophils Absolute: 0.1 10*3/uL (ref 0.0–0.1)
Basophils Relative: 1 %
Eosinophils Absolute: 0.4 10*3/uL (ref 0.0–0.5)
Eosinophils Relative: 4 %
HCT: 35.2 % (ref 34.8–46.6)
Hemoglobin: 10.8 g/dL — ABNORMAL LOW (ref 11.6–15.9)
Lymphocytes Relative: 29 %
Lymphs Abs: 2.8 10*3/uL (ref 0.9–3.3)
MCH: 23 pg — ABNORMAL LOW (ref 25.1–34.0)
MCHC: 30.8 g/dL — ABNORMAL LOW (ref 31.5–36.0)
MCV: 74.7 fL — ABNORMAL LOW (ref 79.5–101.0)
Monocytes Absolute: 0.6 10*3/uL (ref 0.1–0.9)
Monocytes Relative: 6 %
Neutro Abs: 5.7 10*3/uL (ref 1.5–6.5)
Neutrophils Relative %: 60 %
Platelet Count: 413 10*3/uL — ABNORMAL HIGH (ref 145–400)
RBC: 4.71 MIL/uL (ref 3.70–5.45)
RDW: 17.6 % — ABNORMAL HIGH (ref 11.2–14.5)
WBC Count: 9.5 10*3/uL (ref 3.9–10.3)

## 2017-02-24 LAB — SEDIMENTATION RATE: Sed Rate: 36 mm/hr — ABNORMAL HIGH (ref 0–22)

## 2017-02-24 LAB — FERRITIN: Ferritin: 24 ng/mL (ref 11–307)

## 2017-02-24 LAB — C-REACTIVE PROTEIN: CRP: 1.2 mg/dL — ABNORMAL HIGH (ref <1.0)

## 2017-02-24 MED ORDER — IPRATROPIUM BROMIDE 0.06 % NA SOLN: 2.0000 | Freq: Four times a day (QID) | NASAL | 1 refills | Status: DC

## 2017-02-24 MED ORDER — ALBUTEROL SULFATE HFA 108 (90 BASE) MCG/ACT IN AERS: 2.0000 | INHALATION_SPRAY | Freq: Four times a day (QID) | RESPIRATORY_TRACT | 0 refills | Status: DC | PRN

## 2017-02-24 MED ORDER — PREDNISONE 20 MG PO TABS: 40.0000 mg | ORAL_TABLET | Freq: Every day | ORAL | 0 refills | Status: AC

## 2017-02-24 MED ORDER — HYDROCOD POLST-CPM POLST ER 10-8 MG/5ML PO SUER: 5.0000 mL | Freq: Every evening | ORAL | 0 refills | Status: DC | PRN

## 2017-02-24 NOTE — Progress Notes (Signed)
ACUTE VISIT  HPI:  Chief Complaint  Patient presents with  . Cough    off and on for 4 weeks   . Sinusitis  . Wheezing    Beth Holden is a 72 y.o.female here today complaining of 4 weeks or intermittent respiratory symptoms.  Non productive cough,wheezing, sinus pressure,and nasal congestion. Symptoms exacerbated by exertion and alleviated by rest.  No fever,occasional chills.   Cough  This is a new problem. The current episode started 1 to 4 weeks ago. The problem has been waxing and waning. The problem occurs constantly. Associated symptoms include chills, nasal congestion, postnasal drip, rhinorrhea and wheezing. Pertinent negatives include no chest pain, ear congestion, ear pain, eye redness, fever, headaches, heartburn, hemoptysis, myalgias, rash, sore throat, shortness of breath or weight loss. The symptoms are aggravated by exercise. She has tried nothing for the symptoms. Her past medical history is significant for environmental allergies.    No Hx of recent travel. + sick contact, her grandchildren had URI around the same time she got sick.Marland Kitchen   Hx of allergies: Yes. Years ago she had episodes of wheezing, asthma. Allergic rhinitis  OTC medications for this problem: Tylenol , which she takes regularly,last one yesterday morning.  Symptoms otherwise stable.   She is requesting Tussionex for cough ,which she has taken before and well tolerated.Cough is keeping her from sleep.   Hx of DM II,she has had BS's in the low 60's,she is skipping meals. She has also had some in the 200's. Reporting last HgA1C 7.3.  She is on Novolin R and Novolin N 25 U bid.  Right foot ulcer,following with Dr Illene Silver recently started Doxycycline 100 mg bid,day 4th.   Review of Systems  Constitutional: Positive for appetite change, chills and fatigue. Negative for activity change, diaphoresis, fever and weight loss.  HENT: Positive for congestion, postnasal drip,  rhinorrhea and sinus pressure. Negative for ear pain, mouth sores, sore throat and voice change.   Eyes: Negative for discharge and redness.  Respiratory: Positive for cough and wheezing. Negative for hemoptysis and shortness of breath.   Cardiovascular: Negative for chest pain.  Gastrointestinal: Negative for abdominal pain, diarrhea, heartburn, nausea and vomiting.  Endocrine: Negative for polydipsia, polyphagia and polyuria.  Musculoskeletal: Negative for myalgias and neck pain.  Skin: Positive for wound. Negative for rash.  Allergic/Immunologic: Positive for environmental allergies.  Neurological: Negative for syncope, weakness and headaches.  Hematological: Negative for adenopathy. Does not bruise/bleed easily.  Psychiatric/Behavioral: Positive for sleep disturbance. Negative for confusion. The patient is nervous/anxious.      Current Outpatient Medications on File Prior to Visit  Medication Sig Dispense Refill  . acetaminophen (TYLENOL) 500 MG tablet Take 500 mg by mouth every 8 (eight) hours as needed for mild pain.     . cetirizine (ZYRTEC) 10 MG tablet Take 1 tablet (10 mg total) by mouth daily. 90 tablet 1  . cholecalciferol (VITAMIN D) 1000 units tablet Take 1 tablet by mouth daily.     Marland Kitchen doxycycline (VIBRA-TABS) 100 MG tablet Take 1 tablet (100 mg total) by mouth 2 (two) times daily. 60 tablet 0  . Fluocinolone Acetonide (DERMOTIC) 0.01 % OIL Place 1 drop in ear(s) daily as needed (itching).     . gabapentin (NEURONTIN) 100 MG capsule TAKE 2 CAPSULES BY MOUTH AT BEDTIME 180 capsule 1  . glucose blood (ONE TOUCH ULTRA TEST) test strip USE TO TEST BLOOD SUGAR THREE TIMES DAILY AS INSTRUCTED 300 each 1  .  insulin NPH Human (NOVOLIN N RELION) 100 UNIT/ML injection Inject under skin 25 units in am and 25 units at bedtime 20 mL 3  . insulin regular (NOVOLIN R RELION) 100 units/mL injection INJECT 5 to 25 UNITS INTO THE SKIN THREE TIMES DAILY BEFORE MEALS 40 mL 3  . Insulin  Syringe-Needle U-100 (RELION INSULIN SYRINGE 1ML/31G) 31G X 5/16" 1 ML MISC USE 2 TIMES A DAY 200 each 3  . losartan (COZAAR) 25 MG tablet Take 1 tablet (25 mg total) by mouth daily. 90 tablet 1  . lovastatin (MEVACOR) 20 MG tablet Take 1 tablet (20 mg total) by mouth at bedtime. 90 tablet 1  . metFORMIN (GLUCOPHAGE) 1000 MG tablet TAKE 1 TABLET TWICE A DAY WITH A MEAL 180 tablet 1  . metoprolol succinate (TOPROL-XL) 25 MG 24 hr tablet TAKE 1 TABLET EVERY DAY 30 tablet 0  . naproxen sodium (ANAPROX) 220 MG tablet Take 220 mg by mouth daily as needed (pain).    Marland Kitchen omeprazole (PRILOSEC) 20 MG capsule Take 2 capsules (40 mg total) by mouth 2 (two) times daily before a meal. 360 capsule 3  . senna (EQ NATURAL LAXATIVE) 8.6 MG tablet Take 1 tablet by mouth daily.    . silver sulfADIAZINE (SILVADENE) 1 % cream Apply 1 application daily topically. Apply to foot    . vitamin B-12 (CYANOCOBALAMIN) 1000 MCG tablet Take 1,000 mcg daily by mouth.     Current Facility-Administered Medications on File Prior to Visit  Medication Dose Route Frequency Provider Last Rate Last Dose  . 0.9 %  sodium chloride infusion  500 mL Intravenous Once Ladene Artist, MD         Past Medical History:  Diagnosis Date  . Alopecia   . Anemia, mild   . Arthritis   . Chronic cough    sees pulmonologist  . Chronic pain    chest wall and abd - s/p extensive eval  . Diabetes mellitus with neuropathy ([place])    sees endocrine  . Diverticulosis   . Fatty liver   . GERD (gastroesophageal reflux disease)    takes Nexium bid, hx erosive esophagitis  . Headache(784.0)    occasionally;r/t sinus   . History of colon polyps   . HTN (hypertension)   . Hx of amputation of lesser toe ([place])    sees podiatrist  . Hyperlipemia   . IBS (irritable bowel syndrome)   . Insomnia    takes Elavil nightly  . Joint pain   . Neuropathy   . Osteomyelitis ([place])   . Pneumonia   . PONV (postoperative nausea and vomiting)   . Seasonal  allergies    takes Zyrtec daily  . Sinus tachycardia    Allergies  Allergen Reactions  . Codeine Other (See Comments)    Makes her crazy  . Propoxyphene Hcl Itching    Social History   Socioeconomic History  . Marital status: Married    Spouse name: None  . Number of children: 2  . Years of education: None  . Highest education level: None  Social Needs  . Financial resource strain: None  . Food insecurity - worry: None  . Food insecurity - inability: None  . Transportation needs - medical: None  . Transportation needs - non-medical: None  Occupational History  . Occupation: Retired   Tobacco Use  . Smoking status: Never Smoker  . Smokeless tobacco: Never Used  Substance and Sexual Activity  . Alcohol use: No  . Drug  use: No  . Sexual activity: Yes    Birth control/protection: Surgical  Other Topics Concern  . None  Social History Narrative   Caffeine daily    [employer], [employer] no diploma   Married '66   1 dtr- '78; 1 son '71; 2 grandchildren   Occupation: retired 04   Dad with alzheimers-had to place in [place] (summer '10)          Vitals:   02/24/17 1022  BP: 133/74  Pulse: 96  Resp: 12  Temp: 98 F (36.7 C)  SpO2: 97%   Body mass index is 39.48 kg/m.    Physical Exam  Nursing note and vitals reviewed. Constitutional: She is oriented to person, place, and time. She appears well-developed. She does not appear ill. No distress.  HENT:  Head: Normocephalic and atraumatic.  Right Ear: Tympanic membrane, external ear and ear canal normal.  Left Ear: Tympanic membrane, external ear and ear canal normal.  Nose: Rhinorrhea present. Right sinus exhibits no maxillary sinus tenderness and no frontal sinus tenderness. Left sinus exhibits no maxillary sinus tenderness and no frontal sinus tenderness.  Mouth/Throat: Oropharynx is clear and moist and mucous membranes are normal.  Nasal voice. Post nasal drainage.  Eyes: Conjunctivae are normal.  Cardiovascular: Normal  rate and regular rhythm.  No murmur heard. Respiratory: Effort normal. No stridor. No respiratory distress. She has wheezes. She has no rales.  Lymphadenopathy:       Head (right side): No submandibular adenopathy present.       Head (left side): No submandibular adenopathy present.    She has no cervical adenopathy.  Neurological: She is alert and oriented to person, place, and time. She has normal strength.  Skin: Skin is warm. No rash noted. No erythema.  Psychiatric: Her mood appears anxious.  Well groomed, good eye contact.      ASSESSMENT AND PLAN:  Beth Holden was seen today for cough, sinusitis and wheezing.  Diagnoses and all orders for this visit:  Mild reactive airways disease, unspecified whether persistent  Albuterol inh 2 puff every 6 hours for a week then as needed for wheezing or shortness of breath.  Side effects of Prednisone discussed. Continue Doxycycline. Instructed about warning signs. F/U with PCP in 10-14 days.  -     predniSONE (DELTASONE) 20 MG tablet; Take 2 tablets (40 mg total) by mouth daily with breakfast for 5 days. -     albuterol (PROVENTIL HFA;VENTOLIN HFA) 108 (90 Base) MCG/ACT inhaler; Inhale 2 puffs into the lungs every 6 (six) hours as needed for wheezing or shortness of breath.  Allergic rhinitis, unspecified seasonality, unspecified trigger  Nasal irrigations with saline. Atrovent nasal spray may help.  -     ipratropium (ATROVENT) 0.06 % nasal spray; Place 2 sprays into both nostrils 4 (four) times daily.  Cough  Side effects of Tussionex discussed,recommend taking it at night.  -     chlorpheniramine-HYDROcodone (TUSSIONEX PENNKINETIC ER) 10-8 MG/5ML SUER; Take 5 mLs by mouth at bedtime as needed for cough.  Uncontrolled type 2 diabetes mellitus with peripheral circulatory disorder (HCC)  Recommend skipping Novolin R if not eating or decreased dose to 50% if she eats smaller than usual meal. No changes in rest of  antihyperglycemic agents. Instructed about warning signs. Continue following with endocrinologist.     -Beth Holden advised to seek attention immediately if symptoms worsen or to follow if they persist or new concerns arise.       Beth Holden  Beth Holden, [place]. [place] office.

## 2017-02-24 NOTE — Patient Instructions (Signed)
  [name] I have seen you today for an acute visit.  A few things to remember from today's visit:   Allergic rhinitis, unspecified seasonality, unspecified trigger - Plan: ipratropium (ATROVENT) 0.06 % nasal spray  Mild reactive airways disease, unspecified whether persistent - Plan: predniSONE (DELTASONE) 20 MG tablet, albuterol (PROVENTIL HFA;VENTOLIN HFA) 108 (90 Base) MCG/ACT inhaler  Cough - Plan: chlorpheniramine-HYDROcodone (TUSSIONEX PENNKINETIC ER) 10-8 MG/5ML SUER  Nasal irrigations with saline. Albuterol inh 2 puff every 6 hours for a week then as needed for wheezing or shortness of breath.  Continue Doxycycline.  Skip short time insuline if you do not eat. Prednisone can aggravate diabetes.    Medications prescribed today are intended for short period of time and will not be refill upon request, a follow up appointment might be necessary to discuss continuation of of treatment if appropriate.     In general please monitor for signs of worsening symptoms and seek immediate medical attention if any concerning.    I hope you get better soon!

## 2017-02-24 NOTE — Telephone Encounter (Signed)
Printed avs and calender of upcoming appointment. Per 2/15 los 

## 2017-02-25 ENCOUNTER — Encounter: Payer: Self-pay | Admitting: Family Medicine

## 2017-03-02 ENCOUNTER — Encounter: Payer: Self-pay | Admitting: Gastroenterology

## 2017-03-06 ENCOUNTER — Ambulatory Visit (INDEPENDENT_AMBULATORY_CARE_PROVIDER_SITE_OTHER): Payer: Medicare Other | Admitting: Orthopedic Surgery

## 2017-03-06 ENCOUNTER — Encounter (INDEPENDENT_AMBULATORY_CARE_PROVIDER_SITE_OTHER): Payer: Self-pay | Admitting: Orthopedic Surgery

## 2017-03-06 VITALS — Ht 64.0 in | Wt 230.5 lb

## 2017-03-06 DIAGNOSIS — L97511 Non-pressure chronic ulcer of other part of right foot limited to breakdown of skin: Secondary | ICD-10-CM | POA: Diagnosis not present

## 2017-03-06 NOTE — Progress Notes (Signed)
Office Visit Note   [name]           Date of Birth: 1945/02/24           MRN: [id number] Visit Date: 03/06/2017              Requested by: Lucretia Kern, DO [address] [place], [place] [address] PCP: Lucretia Kern, DO  Chief Complaint  Patient presents with  . Right Foot - Wound Check  . Left Foot - Follow-up    11/17/16 Left 3rd & 4th ray amputation.      HPI: Patient presents in follow-up for a Waggoner grade 1 ulcer plantar aspect of the right foot.  Ulcer beneath the third and fourth metatarsal heads she is currently on doxycycline she states that since she has been on doxycycline the swelling in her leg is decreased.  Patient is seeing a hematologist for her anemia she states her hemoglobin was around 9 she states she is scheduled for iron transfusion.  She is currently wearing the medical compression stocking a felt relieving pad and a Darco shoe.  She states she recently was on a prednisone taper for the upper respiratory infection.  Assessment & Plan: Visit Diagnoses:  1. Non-pressure chronic ulcer of other part of right foot limited to breakdown of skin ([place])     Plan: She will complete her doxycycline recommended probiotics continue the Darco shoe and the felt relieving pad.  Follow-Up Instructions: Return in about 3 weeks (around 03/27/2017).   Ortho Exam  Patient is alert, oriented, no adenopathy, well-dressed, normal affect, normal respiratory effort. Examination patient is a strong dorsalis pedis pulse she has no ulcers on the left foot she has no venous stasis swelling or ulcers in either leg she has a Waggoner grade 1 ulcer beneath the third and fourth metatarsal heads of the right foot.  After informed consent a 10 blade knife was used to debride the skin and soft tissue back to bleeding viable granulation tissue.  This was touched with silver nitrate the ulcer is 15 mm in diameter and 3 mm deep this does not probe to bone or  tendon.  Imaging: No results found. No images are attached to the encounter.  Labs: Lab Results  Component Value Date   HGBA1C 7.3 12/26/2016   HGBA1C 7.0 09/19/2016   HGBA1C 7.6 06/20/2016   ESRSEDRATE 36 (H) 02/24/2017   CRP 1.2 (H) 02/24/2017   LABURIC 5.5 09/01/2015   LABORGA GROUP B STREP (S.AGALACTIAE) ISOLATED 12/26/2014    @LABSALLVALUES (HGBA1)@  Body mass index is 39.57 kg/m.  Orders:  No orders of the defined types were placed in this encounter.  No orders of the defined types were placed in this encounter.    Procedures: No procedures performed  Clinical Data: No additional findings.  ROS:  All other systems negative, except as noted in the HPI. Review of Systems  Objective: Vital Signs: Ht 5\' 4"  (1.626 m)   Wt 230 lb 8 oz (104.6 kg)   BMI 39.57 kg/m   Specialty Comments:  No specialty comments available.  PMFS History: Patient Active Problem List   Diagnosis Date Noted  . Anemia 02/24/2017  . Right foot ulcer, limited to breakdown of skin ([place]) 09/26/2016  . Osteomyelitis of toe of left foot ([place])   . Baker's cyst 07/10/2016  . Tachycardia 03/04/2016  . Onychomycosis 12/07/2015  . Non-pressure chronic ulcer of other part of right foot limited to breakdown of skin ([place])  12/07/2015  . Upper airway cough syndrome 03/05/2014  . S/P amputation of lesser toe - followed by Dr. Sharol Given 04/15/2013  . Pain in joint, shoulder region, s/p RTC surgery - followed by Dr. Sharol Given 04/15/2013  . ANEMIA, MILD 02/04/2010  . Essential hypertension 12/05/2006  . Uncontrolled type 2 diabetes mellitus with peripheral circulatory disorder ([place]) 10/12/2006  . Hyperlipidemia 10/12/2006  . Allergic rhinitis 10/12/2006  . GERD - Followed by Dr. Fuller Plan 10/12/2006  . IRRITABLE BOWEL SYNDROME - followed by Dr. Fuller Plan 10/12/2006  . Peripheral neuropathy 10/11/2006  . ALOPECIA NEC 10/11/2006   Past Medical History:  Diagnosis Date  . Alopecia   . Anemia, mild   .  Arthritis   . Chronic cough    sees pulmonologist  . Chronic pain    chest wall and abd - s/p extensive eval  . Diabetes mellitus with neuropathy ([place])    sees endocrine  . Diverticulosis   . Fatty liver   . GERD (gastroesophageal reflux disease)    takes Nexium bid, hx erosive esophagitis  . Headache(784.0)    occasionally;r/t sinus   . History of colon polyps   . HTN (hypertension)   . Hx of amputation of lesser toe ([place])    sees podiatrist  . Hyperlipemia   . IBS (irritable bowel syndrome)   . Insomnia    takes Elavil nightly  . Joint pain   . Neuropathy   . Osteomyelitis ([place])   . Pneumonia   . PONV (postoperative nausea and vomiting)   . Seasonal allergies    takes Zyrtec daily  . Sinus tachycardia     Family History  Problem Relation Age of Onset  . Lung cancer Mother 82       smoked heavily  . Emphysema Mother   . Hypertension Father   . Hyperlipidemia Father   . Diabetes Father   . Coronary artery disease Father   . Dementia Father   . COPD Father        smoked  . Stomach cancer Paternal Aunt   . Brain cancer Paternal Uncle   . Irritable bowel syndrome Unknown        Several family members on fathers side   . Diabetes Other   . Stomach cancer Maternal Aunt   . Lung cancer Paternal Uncle   . Heart disease Paternal Uncle   . Colon cancer Neg Hx     Past Surgical History:  Procedure Laterality Date  . AMPUTATION  12/28/2011   Procedure: AMPUTATION DIGIT;  Surgeon: Newt Minion, MD;  Location: [facility];  Service: Orthopedics;  Laterality: Right;  Right Foot 2nd Toe Amputation at MTP (metatarsophalangeal joint)  . AMPUTATION Right 04/25/2012   Procedure: Right Foot 3rd Toe Amputation;  Surgeon: Newt Minion, MD;  Location: [facility];  Service: Orthopedics;  Laterality: Right;  Right Foot Third Toe Amputation   . AMPUTATION Right 07/27/2012   Procedure: Right 4th Toe Amputation at Metatarsophalangeal;  Surgeon: Newt Minion, MD;  Location: [facility];  Service:  Orthopedics;  Laterality: Right;  Right 4th Toe Amputation at Metatarsophalangeal  . AMPUTATION Left 07/15/2016   Procedure: Left 2nd Ray Amputation;  Surgeon: Newt Minion, MD;  Location: [facility];  Service: Orthopedics;  Laterality: Left;  . AMPUTATION Left 11/18/2016   Procedure: Left 3rd and 4th Ray Amputation;  Surgeon: Newt Minion, MD;  Location: [facility];  Service: Orthopedics;  Laterality: Left;  . COLONOSCOPY    . LAPAROSCOPIC APPENDECTOMY  01/05/2011   Procedure: APPENDECTOMY LAPAROSCOPIC;  Surgeon: Pedro Earls, MD;  Location: [facility];  Service: General;  Laterality: N/A;  . OOPHORECTOMY  2001  . ROTATOR CUFF REPAIR Right    x 2  . TUBAL LIGATION    . VAGINAL HYSTERECTOMY  2001   Social History   Occupational History  . Occupation: Retired   Tobacco Use  . Smoking status: Never Smoker  . Smokeless tobacco: Never Used  Substance and Sexual Activity  . Alcohol use: No  . Drug use: No  . Sexual activity: Yes    Birth control/protection: Surgical

## 2017-03-08 DIAGNOSIS — H43811 Vitreous degeneration, right eye: Secondary | ICD-10-CM | POA: Diagnosis not present

## 2017-03-08 DIAGNOSIS — H5319 Other subjective visual disturbances: Secondary | ICD-10-CM | POA: Diagnosis not present

## 2017-03-08 DIAGNOSIS — E113293 Type 2 diabetes mellitus with mild nonproliferative diabetic retinopathy without macular edema, bilateral: Secondary | ICD-10-CM | POA: Diagnosis not present

## 2017-03-08 DIAGNOSIS — H2513 Age-related nuclear cataract, bilateral: Secondary | ICD-10-CM | POA: Diagnosis not present

## 2017-03-13 ENCOUNTER — Telehealth: Payer: Self-pay | Admitting: Hematology and Oncology

## 2017-03-13 ENCOUNTER — Inpatient Hospital Stay: Payer: Medicare Other | Attending: Hematology and Oncology | Admitting: Hematology and Oncology

## 2017-03-13 ENCOUNTER — Inpatient Hospital Stay: Payer: Medicare Other

## 2017-03-13 ENCOUNTER — Other Ambulatory Visit: Payer: Self-pay

## 2017-03-13 ENCOUNTER — Other Ambulatory Visit: Payer: Self-pay | Admitting: Cardiology

## 2017-03-13 ENCOUNTER — Other Ambulatory Visit: Payer: Self-pay | Admitting: Internal Medicine

## 2017-03-13 ENCOUNTER — Encounter: Payer: Self-pay | Admitting: Hematology and Oncology

## 2017-03-13 VITALS — BP 147/56 | HR 83 | Temp 98.1°F | Resp 20 | Ht 64.0 in | Wt 233.1 lb

## 2017-03-13 DIAGNOSIS — D509 Iron deficiency anemia, unspecified: Secondary | ICD-10-CM | POA: Insufficient documentation

## 2017-03-13 DIAGNOSIS — E11622 Type 2 diabetes mellitus with other skin ulcer: Secondary | ICD-10-CM | POA: Diagnosis not present

## 2017-03-13 DIAGNOSIS — I10 Essential (primary) hypertension: Secondary | ICD-10-CM | POA: Diagnosis not present

## 2017-03-13 DIAGNOSIS — G8929 Other chronic pain: Secondary | ICD-10-CM | POA: Diagnosis not present

## 2017-03-13 DIAGNOSIS — K589 Irritable bowel syndrome without diarrhea: Secondary | ICD-10-CM | POA: Diagnosis not present

## 2017-03-13 DIAGNOSIS — E1140 Type 2 diabetes mellitus with diabetic neuropathy, unspecified: Secondary | ICD-10-CM

## 2017-03-13 DIAGNOSIS — R05 Cough: Secondary | ICD-10-CM | POA: Diagnosis not present

## 2017-03-13 DIAGNOSIS — K2970 Gastritis, unspecified, without bleeding: Secondary | ICD-10-CM | POA: Diagnosis not present

## 2017-03-13 DIAGNOSIS — Z86010 Personal history of colonic polyps: Secondary | ICD-10-CM | POA: Insufficient documentation

## 2017-03-13 DIAGNOSIS — D649 Anemia, unspecified: Secondary | ICD-10-CM

## 2017-03-13 MED ORDER — SODIUM CHLORIDE 0.9 % IV SOLN
125.0000 mg | Freq: Once | INTRAVENOUS | Status: AC
  Administered 2017-03-13: 125 mg via INTRAVENOUS
  Filled 2017-03-13: qty 10

## 2017-03-13 MED ORDER — SODIUM CHLORIDE 0.9 % IV SOLN
Freq: Once | INTRAVENOUS | Status: AC
  Administered 2017-03-13: 09:00:00 via INTRAVENOUS

## 2017-03-13 NOTE — Telephone Encounter (Signed)
Scheduled appt per 3/4 los - Gave patient AVS and calender per los.  

## 2017-03-13 NOTE — Patient Instructions (Signed)
Sodium Ferric Gluconate Complex injection What is this medicine? SODIUM FERRIC GLUCONATE COMPLEX (SOE dee um FER ik GLOO koe nate KOM pleks) is an iron replacement. It is used with epoetin therapy to treat low iron levels in patients who are receiving hemodialysis. This medicine may be used for other purposes; ask your health care provider or pharmacist if you have questions. COMMON BRAND NAME(S): Ferrlecit, Nulecit What should I tell my health care provider before I take this medicine? They need to know if you have any of the following conditions: -anemia that is not from iron deficiency -high levels of iron in the body -an unusual or allergic reaction to iron, benzyl alcohol, other medicines, foods, dyes, or preservatives -pregnant or are trying to become pregnant -breast-feeding How should I use this medicine? This medicine is for infusion into a vein. It is given by a health care professional in a hospital or clinic setting. Talk to your pediatrician regarding the use of this medicine in children. While this drug may be prescribed for children as young as 6 years old for selected conditions, precautions do apply. Overdosage: If you think you have taken too much of this medicine contact a poison control center or emergency room at once. NOTE: This medicine is only for you. Do not share this medicine with others. What if I miss a dose? It is important not to miss your dose. Call your doctor or health care professional if you are unable to keep an appointment. What may interact with this medicine? Do not take this medicine with any of the following medications: -deferoxamine -dimercaprol -other iron products This medicine may also interact with the following medications: -chloramphenicol -deferasirox -medicine for blood pressure like enalapril This list may not describe all possible interactions. Give your health care provider a list of all the medicines, herbs, non-prescription drugs,  or dietary supplements you use. Also tell them if you smoke, drink alcohol, or use illegal drugs. Some items may interact with your medicine. What should I watch for while using this medicine? Your condition will be monitored carefully while you are receiving this medicine. Visit your doctor for check-ups as directed. What side effects may I notice from receiving this medicine? Side effects that you should report to your doctor or health care professional as soon as possible: -allergic reactions like skin rash, itching or hives, swelling of the face, lips, or tongue -breathing problems -changes in hearing -changes in vision -chills, flushing, or sweating -fast, irregular heartbeat -feeling faint or lightheaded, falls -fever, flu-like symptoms -high or low blood pressure -pain, tingling, numbness in the hands or feet -severe pain in the chest, back, flanks, or groin -swelling of the ankles, feet, hands -trouble passing urine or change in the amount of urine -unusually weak or tired Side effects that usually do not require medical attention (report to your doctor or health care professional if they continue or are bothersome): -cramps -dark colored stools -diarrhea -headache -nausea, vomiting -stomach upset This list may not describe all possible side effects. Call your doctor for medical advice about side effects. You may report side effects to FDA at 1-800-FDA-1088. Where should I keep my medicine? This drug is given in a hospital or clinic and will not be stored at home. NOTE: This sheet is a summary. It may not cover all possible information. If you have questions about this medicine, talk to your doctor, pharmacist, or health care provider.  2018 Elsevier/Gold Standard (2007-08-29 15:58:57)  

## 2017-03-15 ENCOUNTER — Other Ambulatory Visit: Payer: Self-pay | Admitting: Family Medicine

## 2017-03-16 NOTE — Telephone Encounter (Signed)
Please refill 90 days, and 1 refill. Will ask scheduling to contact patient to to make appointment with Dr. Harrington Challenger or APP.

## 2017-03-19 ENCOUNTER — Other Ambulatory Visit (INDEPENDENT_AMBULATORY_CARE_PROVIDER_SITE_OTHER): Payer: Self-pay | Admitting: Orthopedic Surgery

## 2017-03-19 NOTE — Progress Notes (Signed)
[facility] New Visit:  Assessment: Anemia 72 y.o. female with microcytic hypochromic anemia in the setting of iron deficiency detected in January 2019.  Recent endoscopic evaluations show no frank gastrointestinal bleeding, but significant gastritis is noted that is a potential cause of intermittent GI blood loss.  In addition, patient has likely a chronic inflammatory state due to persistent diabetic ulcers which impair anemia recovery by creating deterioration consistent with anemia of chronic disease.  Time of the clinic visit, report from the gastric biopsy was pending.   Plan: -Increase oral iron to 3 times a day. -Repeat labs today as outlined below. -Return to clinic in 2 weeks: Clinic visit to review the findings, possible IV iron infusion.   Voice recognition software was used and creation of this note. Despite my best effort at editing the text, some misspelling/errors may have occurred. Orders Placed This Encounter  Procedures  . CBC with Differential (Cancer Center Only)    Standing Status:   Future    Number of Occurrences:   1    Standing Expiration Date:   02/25/2068  . CMP ([facility] only)    Standing Status:   Future    Number of Occurrences:   1    Standing Expiration Date:   02/24/2018  . Iron and TIBC    Standing Status:   Future    Number of Occurrences:   1    Standing Expiration Date:   02/24/2018  . Ferritin    Standing Status:   Future    Number of Occurrences:   1    Standing Expiration Date:   02/24/2018  . Sedimentation rate    Standing Status:   Future    Number of Occurrences:   1    Standing Expiration Date:   02/24/2018  . C-reactive protein    Standing Status:   Future    Number of Occurrences:   1    Standing Expiration Date:   02/24/2018    All questions were answered.  . The patient knows to call the clinic with any problems, questions or concerns.  This note was electronically signed.    History of Presenting  Illness Beth Holden 72 y.o. presenting to the [facility] for evaluation of microcytic anemia, referred by Dr Lucretia Kern.  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  At the present time, patient is taking ferrous sulfate twice daily which was started in January 2019.  Patient reports gradual increasing fatigue over the past 6 months, she reports increasing constipation with liquid stools producing urgency.  Patient has a diabetic foot ulcer that was previously improving, but patient notices worsening over the recent several weeks.  Past November, patient has undergone amputations to toes due to diabetic foot ulcers and in July she lost another 2.  Denies any active fevers, chills, night sweats.  No chest pain, shortness of breath, or cough.  Denies epistaxis, hemoptysis, hematemesis, melena, or hematochezia.  No vaginal bleeding or hematuria.  Oncological/hematological History: **Microcytic Anemia: --Labs, 11/18/16: WBC 10.4, Hgb 9.6, MCV 77.0, MCH 22.7, MCHC 29.5, RDW 15.9, Plt 432; --Labs, 12/23/16: WBC 13.8, Hgb 9.8, MCV 72.7, MCH 22.8, MCHC 31.4, RDW 15.1, Plt 467;  --Labs, 01/13/17:  Fe 19, FeSat 4.2%, TIBC ..., Ferritin 15.3, Transferrin 321 --Colonoscopy/EGD (Dr Cecil Cranker), 02/15/17: Diffuse edema, erythema, and friability with granularity of the gastric body and posterior wall of the antrum mucosal surfaces consistent with gastritis.  Normal appearance of duodenum and esophagus.  Colonoscopy demonstrated a 0.6 cm transverse colon polyp which has been removed.  Pathology -- GASTRIC ANTRAL MUCOSA WITH NON-SPECIFIC REACTIVE GASTROPATHY AND FEW  SUBEPITHELIAL CRYSTALLINE IRON DEPOSITS, CONSISTENT WITH IRON PILL GASTROPATHY. GASTRIC OXYNTIC MUCOSA WITH PARIETAL CELL HYPERPLASIA AS CAN BE SEEN IN HYPERGASTRINEMIC STATES SUCH AS PPI THERAPY. WARTHIN-STARRY STAIN IS NEGATIVE FOR HELICOBACTER PYLORI. DUODENAL MUCOSA WITH NO SPECIFIC HISTOPATHOLOGIC CHANGES. NEGATIVE FOR INCREASED INTRAEPITHELIAL LYMPHOCYTES OR VILLOUS ARCHITECTURAL CHANGES. TUBULAR ADENOMA. NEGATIVE FOR HIGH GRADE DYSPLASIA OR MALIGNANCY.    Medical History: Past Medical History:  Diagnosis Date  . Alopecia   . Anemia, mild   . Arthritis   . Chronic cough    sees pulmonologist  . Chronic pain    chest wall and abd - s/p extensive eval  . Diabetes mellitus with neuropathy ([place])    sees endocrine  . Diverticulosis   . Fatty liver   . GERD (gastroesophageal reflux disease)    takes Nexium bid, hx erosive esophagitis  . Headache(784.0)    occasionally;r/t sinus   . History of colon polyps   . HTN (hypertension)   . Hx of amputation of lesser toe ([place])    sees podiatrist  . Hyperlipemia   . IBS (irritable bowel syndrome)   . Insomnia    takes Elavil nightly  . Joint pain   . Neuropathy   . Osteomyelitis ([place])   . Pneumonia   . PONV (postoperative nausea and vomiting)   . Seasonal allergies    takes Zyrtec daily  . Sinus tachycardia     Surgical History: Past Surgical History:  Procedure Laterality Date  . AMPUTATION  12/28/2011   Procedure: AMPUTATION DIGIT;  Surgeon: Newt Minion, MD;  Location: [facility];  Service: Orthopedics;  Laterality: Right;  Right Foot 2nd Toe Amputation at MTP (metatarsophalangeal joint)  . AMPUTATION Right 04/25/2012   Procedure: Right Foot 3rd Toe Amputation;  Surgeon: Newt Minion, MD;  Location: [facility];  Service: Orthopedics;  Laterality: Right;  Right Foot Third Toe Amputation   . AMPUTATION Right 07/27/2012   Procedure: Right 4th Toe Amputation at Metatarsophalangeal;  Surgeon: Newt Minion, MD;  Location: [facility];  Service:  Orthopedics;  Laterality: Right;  Right 4th Toe Amputation at Metatarsophalangeal  . AMPUTATION Left 07/15/2016   Procedure: Left 2nd Ray Amputation;  Surgeon: Newt Minion, MD;  Location: [facility];  Service: Orthopedics;  Laterality: Left;  . AMPUTATION Left 11/18/2016   Procedure: Left 3rd and 4th Ray Amputation;  Surgeon: Newt Minion, MD;  Location: [facility];  Service: Orthopedics;  Laterality: Left;  . COLONOSCOPY    . LAPAROSCOPIC APPENDECTOMY  01/05/2011   Procedure: APPENDECTOMY LAPAROSCOPIC;  Surgeon: Pedro Earls, MD;  Location: [facility];  Service: General;  Laterality: N/A;  . OOPHORECTOMY  2001  . ROTATOR CUFF REPAIR Right    x 2  . TUBAL LIGATION    . VAGINAL HYSTERECTOMY  2001    Family History: Family History  Problem Relation Age of Onset  . Lung cancer Mother 76       smoked heavily  . Emphysema Mother   . Hypertension Father   . Hyperlipidemia Father   . Diabetes Father   . Coronary artery  disease Father   . Dementia Father   . COPD Father        smoked  . Stomach cancer Paternal Aunt   . Brain cancer Paternal Uncle   . Irritable bowel syndrome Unknown        Several family members on fathers side   . Diabetes Other   . Stomach cancer Maternal Aunt   . Lung cancer Paternal Uncle   . Heart disease Paternal Uncle   . Colon cancer Neg Hx     Social History: Social History   Socioeconomic History  . Marital status: Married    Spouse name: Not on file  . Number of children: 2  . Years of education: Not on file  . Highest education level: Not on file  Social Needs  . Financial resource strain: Not on file  . Food insecurity - worry: Not on file  . Food insecurity - inability: Not on file  . Transportation needs - medical: Not on file  . Transportation needs - non-medical: Not on file  Occupational History  . Occupation: Retired   Tobacco Use  . Smoking status: Never Smoker  . Smokeless tobacco: Never Used  Substance and Sexual Activity  . Alcohol  use: No  . Drug use: No  . Sexual activity: Yes    Birth control/protection: Surgical  Other Topics Concern  . Not on file  Social History Narrative   Caffeine daily    [employer], [employer] no diploma   Married '66   1 dtr- '78; 1 son '71; 2 grandchildren   Occupation: retired 04   Dad with alzheimers-had to place in [place] (summer '10)          Allergies: Allergies  Allergen Reactions  . Codeine Other (See Comments)    Makes her crazy  . Propoxyphene Hcl Itching    Medications:  Current Outpatient Medications  Medication Sig Dispense Refill  . acetaminophen (TYLENOL) 500 MG tablet Take 500 mg by mouth every 8 (eight) hours as needed for mild pain.     Marland Kitchen albuterol (PROVENTIL HFA;VENTOLIN HFA) 108 (90 Base) MCG/ACT inhaler Inhale 2 puffs into the lungs every 6 (six) hours as needed for wheezing or shortness of breath. (Patient not taking: Reported on 03/13/2017) 1 Inhaler 0  . cetirizine (ZYRTEC) 10 MG tablet Take 1 tablet (10 mg total) by mouth daily. 90 tablet 1  . chlorpheniramine-HYDROcodone (TUSSIONEX PENNKINETIC ER) 10-8 MG/5ML SUER Take 5 mLs by mouth at bedtime as needed for cough. 100 mL 0  . cholecalciferol (VITAMIN D) 1000 units tablet Take 1 tablet by mouth daily.     Marland Kitchen doxycycline (VIBRA-TABS) 100 MG tablet Take 1 tablet (100 mg total) by mouth 2 (two) times daily. 60 tablet 0  . EQ ALLERGY RELIEF, CETIRIZINE, 10 MG tablet TAKE 1 TABLET BY MOUTH DAILY 90 tablet 1  . Fluocinolone Acetonide (DERMOTIC) 0.01 % OIL Place 1 drop in ear(s) daily as needed (itching).     . gabapentin (NEURONTIN) 100 MG capsule TAKE 2 CAPSULES BY MOUTH AT BEDTIME 180 capsule 1  . glucose blood (ONE TOUCH ULTRA TEST) test strip USE TO TEST BLOOD SUGAR THREE TIMES DAILY AS INSTRUCTED 300 each 1  . insulin NPH Human (NOVOLIN N RELION) 100 UNIT/ML injection Inject under skin 25 units in am and 25 units at bedtime 20 mL 3  . Insulin Syringe-Needle U-100 (RELION INSULIN SYRINGE 1ML/31G) 31G X 5/16" 1 ML MISC USE  2 TIMES A DAY 200 each 3  .  ipratropium (ATROVENT) 0.06 % nasal spray Place 2 sprays into both nostrils 4 (four) times daily. (Patient not taking: Reported on 03/13/2017) 15 mL 1  . losartan (COZAAR) 25 MG tablet Take 1 tablet (25 mg total) by mouth daily. 90 tablet 1  . lovastatin (MEVACOR) 20 MG tablet Take 1 tablet (20 mg total) by mouth at bedtime. 90 tablet 1  . metFORMIN (GLUCOPHAGE) 1000 MG tablet TAKE 1 TABLET TWICE A DAY WITH A MEAL 180 tablet 1  . metoprolol succinate (TOPROL-XL) 25 MG 24 hr tablet TAKE 1 TABLET BY MOUTH EVERY DAY 90 tablet 1  . naproxen sodium (ANAPROX) 220 MG tablet Take 220 mg by mouth daily as needed (pain).    . NOVOLIN R RELION 100 UNIT/ML injection INJECT 12 TO 30 UNITS INTO THE SKIN THREE TIMES DAILY BEFORE MEALS 20 mL 1  . omeprazole (PRILOSEC) 20 MG capsule Take 2 capsules (40 mg total) by mouth 2 (two) times daily before a meal. 360 capsule 3  . senna (EQ NATURAL LAXATIVE) 8.6 MG tablet Take 1 tablet by mouth daily.    . silver sulfADIAZINE (SILVADENE) 1 % cream Apply 1 application daily topically. Apply to foot    . vitamin B-12 (CYANOCOBALAMIN) 1000 MCG tablet Take 1,000 mcg daily by mouth.     Current Facility-Administered Medications  Medication Dose Route Frequency Provider Last Rate Last Dose  . 0.9 %  sodium chloride infusion  500 mL Intravenous Once Ladene Artist, MD        Review of Systems: Review of Systems  Constitutional: Positive for fatigue.  Respiratory: Positive for cough.   Gastrointestinal: Positive for constipation and diarrhea.  All other systems reviewed and are negative.    PHYSICAL EXAMINATION Blood pressure (!) 152/74, pulse 91, temperature 97.9 F (36.6 C), temperature source Oral, resp. rate 16, height '5\' 4"'  (1.626 m), weight 230 lb 8 oz (104.6 kg), SpO2 96 %.  ECOG PERFORMANCE STATUS: 2 - Symptomatic, <50% confined to bed  Physical Exam  Constitutional: She is oriented to person, place, and time and well-developed,  well-nourished, and in no distress. No distress.  HENT:  Head: Normocephalic and atraumatic.  Mouth/Throat: Oropharynx is clear and moist. No oropharyngeal exudate.  Eyes: Conjunctivae and EOM are normal. Pupils are equal, round, and reactive to light. No scleral icterus.  Neck: No thyromegaly present.  Cardiovascular: Normal rate, regular rhythm and normal heart sounds.  No murmur heard. Pulmonary/Chest: Effort normal and breath sounds normal. No respiratory distress. She has no wheezes. She has no rales.  Abdominal: Soft. Bowel sounds are normal. She exhibits no distension and no mass. There is no tenderness. There is no guarding.  Musculoskeletal: She exhibits no edema.  Lymphadenopathy:    She has no cervical adenopathy.  Neurological: She is alert and oriented to person, place, and time. She has normal reflexes. No cranial nerve deficit.  Skin: Skin is warm and dry. No rash noted. She is not diaphoretic. No erythema. No pallor.     LABORATORY DATA: I have personally reviewed the data as listed: Appointment on 02/24/2017  Component Date Value Ref Range Status  . WBC Count 02/24/2017 9.5  3.9 - 10.3 K/uL Final  . RBC 02/24/2017 4.71  3.70 - 5.45 MIL/uL Final  . Hemoglobin 02/24/2017 10.8* 11.6 - 15.9 g/dL Final  . HCT 02/24/2017 35.2  34.8 - 46.6 % Final  . MCV 02/24/2017 74.7* 79.5 - 101.0 fL Final  . MCH 02/24/2017 23.0* 25.1 - 34.0 pg Final  .  MCHC 02/24/2017 30.8* 31.5 - 36.0 g/dL Final  . RDW 02/24/2017 17.6* 11.2 - 14.5 % Final  . Platelet Count 02/24/2017 413* 145 - 400 K/uL Final  . Neutrophils Relative % 02/24/2017 60  % Final  . Neutro Abs 02/24/2017 5.7  1.5 - 6.5 K/uL Final  . Lymphocytes Relative 02/24/2017 29  % Final  . Lymphs Abs 02/24/2017 2.8  0.9 - 3.3 K/uL Final  . Monocytes Relative 02/24/2017 6  % Final  . Monocytes Absolute 02/24/2017 0.6  0.1 - 0.9 K/uL Final  . Eosinophils Relative 02/24/2017 4  % Final  . Eosinophils Absolute 02/24/2017 0.4  0.0 - 0.5  K/uL Final  . Basophils Relative 02/24/2017 1  % Final  . Basophils Absolute 02/24/2017 0.1  0.0 - 0.1 K/uL Final   Performed at [facility], [place] [address] [place], [place] [address]  . Sodium 02/24/2017 141  136 - 145 mmol/L Final  . Potassium 02/24/2017 4.1  3.5 - 5.1 mmol/L Final  . Chloride 02/24/2017 103  98 - 109 mmol/L Final  . CO2 02/24/2017 26  22 - 29 mmol/L Final  . Glucose, Bld 02/24/2017 164* 70 - 140 mg/dL Final  . BUN 02/24/2017 18  7 - 26 mg/dL Final  . Creatinine 02/24/2017 1.14* 0.60 - 1.10 mg/dL Final  . Calcium 02/24/2017 9.8  8.4 - 10.4 mg/dL Final  . Total Protein 02/24/2017 7.5  6.4 - 8.3 g/dL Final  . Albumin 02/24/2017 3.6  3.5 - 5.0 g/dL Final  . AST 02/24/2017 21  5 - 34 U/L Final  . ALT 02/24/2017 14  0 - 55 U/L Final  . Alkaline Phosphatase 02/24/2017 60  40 - 150 U/L Final  . Total Bilirubin 02/24/2017 <0.2* 0.2 - 1.2 mg/dL Final  . GFR, Est Non Af Am 02/24/2017 48* >60 mL/min Final  . GFR, Est AFR Am 02/24/2017 56* >60 mL/min Final   Comment: (NOTE) The eGFR has been calculated using the CKD EPI equation. This calculation has not been validated in all clinical situations. eGFR's persistently <60 mL/min signify possible Chronic Kidney Disease.   Georgiann Hahn gap 02/24/2017 12* 3 - 11 Final   Performed at [facility], [place] [address], [place], [place] [address]  . Iron 02/24/2017 24* 28 - 170 ug/dL Final  . TIBC 02/24/2017 384  250 - 450 ug/dL Final  . Saturation Ratios 02/24/2017 6* 10.4 - 31.8 % Final  . UIBC 02/24/2017 360  ug/dL Final   Performed at [facility], [place] [address], [place], [place] [address]  . Ferritin 02/24/2017 24  11 - 307 ng/mL Final   Performed at [facility], [place] [address], [place], [place] [address]  . Sed Rate 02/24/2017 36* 0 - 22 mm/hr Final   Performed at [facility], [place] [address] [place], [place] [address]  . CRP 02/24/2017 1.2* <1.0  mg/dL Final   Performed at [facility] [address], [place],  [address]         Ardath Sax, MD

## 2017-03-19 NOTE — Assessment & Plan Note (Signed)
72 y.o. female with microcytic hypochromic anemia in the setting of iron deficiency detected in January 2019.  Recent endoscopic evaluations show no frank gastrointestinal bleeding, but significant gastritis is noted that is a potential cause of intermittent GI blood loss.  In addition, patient has likely a chronic inflammatory state due to persistent diabetic ulcers which impair anemia recovery by creating deterioration consistent with anemia of chronic disease.  Time of the clinic visit, report from the gastric biopsy was pending.   Plan: -Increase oral iron to 3 times a day. -Repeat labs today as outlined below. -Return to clinic in 2 weeks: Clinic visit to review the findings, possible IV iron infusion.

## 2017-03-20 ENCOUNTER — Inpatient Hospital Stay: Payer: Medicare Other

## 2017-03-20 VITALS — BP 133/63 | HR 80 | Temp 98.3°F | Resp 18

## 2017-03-20 DIAGNOSIS — D649 Anemia, unspecified: Secondary | ICD-10-CM

## 2017-03-20 DIAGNOSIS — E1140 Type 2 diabetes mellitus with diabetic neuropathy, unspecified: Secondary | ICD-10-CM | POA: Diagnosis not present

## 2017-03-20 DIAGNOSIS — D509 Iron deficiency anemia, unspecified: Secondary | ICD-10-CM | POA: Diagnosis not present

## 2017-03-20 DIAGNOSIS — K2970 Gastritis, unspecified, without bleeding: Secondary | ICD-10-CM | POA: Diagnosis not present

## 2017-03-20 DIAGNOSIS — E11622 Type 2 diabetes mellitus with other skin ulcer: Secondary | ICD-10-CM | POA: Diagnosis not present

## 2017-03-20 DIAGNOSIS — I10 Essential (primary) hypertension: Secondary | ICD-10-CM | POA: Diagnosis not present

## 2017-03-20 DIAGNOSIS — Z86010 Personal history of colonic polyps: Secondary | ICD-10-CM | POA: Diagnosis not present

## 2017-03-20 MED ORDER — SODIUM CHLORIDE 0.9 % IV SOLN
125.0000 mg | Freq: Once | INTRAVENOUS | Status: AC
  Administered 2017-03-20: 125 mg via INTRAVENOUS
  Filled 2017-03-20: qty 10

## 2017-03-20 MED ORDER — SODIUM CHLORIDE 0.9 % IV SOLN
Freq: Once | INTRAVENOUS | Status: AC
  Administered 2017-03-20: 08:00:00 via INTRAVENOUS

## 2017-03-20 NOTE — Telephone Encounter (Signed)
Do oyu want to refill doxy?

## 2017-03-20 NOTE — Patient Instructions (Signed)
Sodium Ferric Gluconate Complex injection What is this medicine? SODIUM FERRIC GLUCONATE COMPLEX (SOE dee um FER ik GLOO koe nate KOM pleks) is an iron replacement. It is used with epoetin therapy to treat low iron levels in patients who are receiving hemodialysis. This medicine may be used for other purposes; ask your health care provider or pharmacist if you have questions. COMMON BRAND NAME(S): Ferrlecit, Nulecit What should I tell my health care provider before I take this medicine? They need to know if you have any of the following conditions: -anemia that is not from iron deficiency -high levels of iron in the body -an unusual or allergic reaction to iron, benzyl alcohol, other medicines, foods, dyes, or preservatives -pregnant or are trying to become pregnant -breast-feeding How should I use this medicine? This medicine is for infusion into a vein. It is given by a health care professional in a hospital or clinic setting. Talk to your pediatrician regarding the use of this medicine in children. While this drug may be prescribed for children as young as 6 years old for selected conditions, precautions do apply. Overdosage: If you think you have taken too much of this medicine contact a poison control center or emergency room at once. NOTE: This medicine is only for you. Do not share this medicine with others. What if I miss a dose? It is important not to miss your dose. Call your doctor or health care professional if you are unable to keep an appointment. What may interact with this medicine? Do not take this medicine with any of the following medications: -deferoxamine -dimercaprol -other iron products This medicine may also interact with the following medications: -chloramphenicol -deferasirox -medicine for blood pressure like enalapril This list may not describe all possible interactions. Give your health care provider a list of all the medicines, herbs, non-prescription drugs,  or dietary supplements you use. Also tell them if you smoke, drink alcohol, or use illegal drugs. Some items may interact with your medicine. What should I watch for while using this medicine? Your condition will be monitored carefully while you are receiving this medicine. Visit your doctor for check-ups as directed. What side effects may I notice from receiving this medicine? Side effects that you should report to your doctor or health care professional as soon as possible: -allergic reactions like skin rash, itching or hives, swelling of the face, lips, or tongue -breathing problems -changes in hearing -changes in vision -chills, flushing, or sweating -fast, irregular heartbeat -feeling faint or lightheaded, falls -fever, flu-like symptoms -high or low blood pressure -pain, tingling, numbness in the hands or feet -severe pain in the chest, back, flanks, or groin -swelling of the ankles, feet, hands -trouble passing urine or change in the amount of urine -unusually weak or tired Side effects that usually do not require medical attention (report to your doctor or health care professional if they continue or are bothersome): -cramps -dark colored stools -diarrhea -headache -nausea, vomiting -stomach upset This list may not describe all possible side effects. Call your doctor for medical advice about side effects. You may report side effects to FDA at 1-800-FDA-1088. Where should I keep my medicine? This drug is given in a hospital or clinic and will not be stored at home. NOTE: This sheet is a summary. It may not cover all possible information. If you have questions about this medicine, talk to your doctor, pharmacist, or health care provider.  2018 Elsevier/Gold Standard (2007-08-29 15:58:57)  

## 2017-03-22 ENCOUNTER — Telehealth (INDEPENDENT_AMBULATORY_CARE_PROVIDER_SITE_OTHER): Payer: Self-pay | Admitting: Orthopedic Surgery

## 2017-03-22 NOTE — Telephone Encounter (Signed)
Called pt and dvised to hold rx until her appt on 03/27/17. Pt voiced understanding and will call with any questions.

## 2017-03-22 NOTE — Telephone Encounter (Signed)
Hold on taking the prescription until we see her back in the office.

## 2017-03-22 NOTE — Progress Notes (Signed)
[facility] Follow-up Visit:  Assessment: Anemia 72 y.o. female with microcytic hypochromic anemia in the setting of iron deficiency detected in January 2019.  Recent endoscopic evaluations show no frank gastrointestinal bleeding, but significant gastritis is noted that is a potential cause of intermittent GI blood loss.  In addition, patient has likely a chronic inflammatory state due to persistent diabetic ulcers which impair anemia recovery by creating deterioration consistent with anemia of chronic disease.  Time of the clinic visit, report from the gastric biopsy was pending.  Repeat blood work obtained during our last clinic visit confirmed presence of significant iron deficiency especially in the context of elevated inflammatory markers with ferritin 24 being likely artificially elevated by the inflammation reaction.  In the interim, hemoglobin has improved a little bit, but remains suppressed with significant microcytosis and hypochromia.  Interestingly, upper endoscopy demonstrates presence of significant gastritis likely attributable to side effects of the oral iron intake.   Plan: -Discontinue oral iron due to gastric irritation -Start patient on parenteral iron supplementation: Ferrlecit weekly x4 -Return to clinic in 6 weeks: Labs 2-3 days prior, clinic visit, possible additional parenteral iron administration.   Voice recognition software was used and creation of this note. Despite my best effort at editing the text, some misspelling/errors may have occurred.  Orders Placed This Encounter  Procedures  . CBC with Differential (Cancer Center Only)    Standing Status:   Future    Standing Expiration Date:   03/14/2018  . CMP ([facility] only)    Standing Status:   Future    Standing Expiration Date:   03/14/2018  . Iron and TIBC    Standing Status:   Future    Standing Expiration Date:   03/14/2018  . Ferritin    Standing Status:   Future    Standing  Expiration Date:   03/14/2018    Cancer Staging No matching staging information was found for the patient.  All questions were answered.  . The patient knows to call the clinic with any problems, questions or concerns.  This note was electronically signed.    History of Presenting Illness Beth Holden is a 72 y.o. female followed in the [facility] for evaluation of microcytic anemia, referred by Dr Lucretia Kern.  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  At the present time, patient is taking ferrous sulfate twice daily which was started in January 2019.  Patient reports gradual increasing fatigue over the past 6 months, she reports increasing constipation with liquid stools producing urgency.  Patient has a diabetic foot ulcer that was previously improving, but patient notices worsening over the recent several weeks.  Past November, patient has undergone amputations to toes due to diabetic foot ulcers and in July she lost another 2.  Denies any active fevers, chills, night sweats.  No chest pain, shortness of breath, or cough.  Denies epistaxis, hemoptysis, hematemesis, melena, or hematochezia.  No vaginal bleeding or hematuria.  Oncological/hematological History: **Microcytic Anemia: --Labs, 11/18/16: WBC 10.4, Hgb 9.6, MCV 77.0, MCH 22.7, MCHC 29.5, RDW 15.9, Plt 432; --Labs, 12/23/16: WBC 13.8, Hgb 9.8, MCV 72.7, MCH 22.8, MCHC 31.4, RDW 15.1, Plt 467;  --Labs, 01/13/17: Fe 19, FeSat 4.2%, TIBC .Marland KitchenMarland Kitchen,  Ferritin 15.3, Transferrin 321 --Colonoscopy/EGD (Dr Cecil Cranker), 02/15/17: Diffuse edema, erythema, and friability with granularity of the  gastric body and posterior wall of the antrum mucosal surfaces consistent with gastritis.  Normal appearance of duodenum and esophagus.  Colonoscopy demonstrated a 0.6 cm transverse colon polyp which has been removed.  Pathology -- GASTRIC ANTRAL MUCOSA WITH NON-SPECIFIC REACTIVE GASTROPATHY AND FEW SUBEPITHELIAL CRYSTALLINE IRON DEPOSITS, CONSISTENT WITH IRON PILL GASTROPATHY. GASTRIC OXYNTIC MUCOSA WITH PARIETAL CELL HYPERPLASIA AS CAN BE SEEN IN HYPERGASTRINEMIC STATES SUCH AS PPI THERAPY. WARTHIN-STARRY STAIN IS NEGATIVE FOR HELICOBACTER PYLORI. DUODENAL MUCOSA WITH NO SPECIFIC HISTOPATHOLOGIC CHANGES. NEGATIVE FOR INCREASED INTRAEPITHELIAL LYMPHOCYTES OR VILLOUS ARCHITECTURAL CHANGES. TUBULAR ADENOMA. NEGATIVE FOR HIGH GRADE DYSPLASIA OR MALIGNANCY. --Labs, 02/24/17: Hgb 10.8, MCV 74.7, MCH 23.0, MCHC 30.8, RDW 17.6, Plt 413; Fe 24, FeSat 6%, TIBC 384, Ferritin 24; CRP 1.2, ESR 36 --Treatment:    --Ferrlecit 168m IV, 03/13/17   --Ferrlecit 1253mIV, 03/20/17: pending   --Ferrlecit 12579mV, 03/27/17: pending   --Ferrlecit 125m18m, 04/03/17: pending   No history exists.    Medical History: Past Medical History:  Diagnosis Date  . Alopecia   . Anemia, mild   . Arthritis   . Chronic cough    sees pulmonologist  . Chronic pain    chest wall and abd - s/p extensive eval  . Diabetes mellitus with neuropathy (HCC)[place] sees endocrine  . Diverticulosis   . Fatty liver   . GERD (gastroesophageal reflux disease)    takes Nexium bid, hx erosive esophagitis  . Headache(784.0)    occasionally;r/t sinus   . History of colon polyps   . HTN (hypertension)   . Hx of amputation of lesser toe (HCC)[place] sees podiatrist  . Hyperlipemia   . IBS (irritable bowel syndrome)   . Insomnia    takes Elavil nightly  . Joint pain   . Neuropathy   . Osteomyelitis (HCC)[place]. Pneumonia   . PONV (postoperative nausea and vomiting)   . Seasonal allergies    takes Zyrtec daily  . Sinus tachycardia      Surgical History: Past Surgical History:  Procedure Laterality Date  . AMPUTATION  12/28/2011   Procedure: AMPUTATION DIGIT;  Surgeon: MarcNewt Minion;  [facility];  Laterality: Right;  Right Foot 2nd Toe Amputation at MTP (metatarsophalangeal joint)  . AMPUTATION Right 04/25/2012   Procedure: Right Foot 3rd Toe Amputation;  Surgeon: MarcNewt Minion;  [facility] [place];  Laterality: Right;  Right Foot Third Toe Amputation   . AMPUTATION Right 07/27/2012   Procedure: Right 4th Toe Amputation at Metatarsophalangeal;  Surgeon: MarcNewt Minion;  [facility] [place];  Laterality: Right;  Right 4th Toe Amputation at Metatarsophalangeal  . AMPUTATION Left 07/15/2016   Procedure: Left 2nd Ray Amputation;  Surgeon: DudaNewt Minion;  [facility];  Laterality: Left;  . AMPUTATION Left 11/18/2016   Procedure: Left 3rd and 4th Ray Amputation;  Surgeon: DudaNewt Minion;  [facility];  Laterality: Left;  . COLONOSCOPY    . LAPAROSCOPIC APPENDECTOMY  01/05/2011   Procedure: APPENDECTOMY LAPAROSCOPIC;  Surgeon: MattPedro Earls;  Location: [facility];  Service: General;  Laterality: N/A;  . OOPHORECTOMY  2001  . ROTATOR CUFF REPAIR Right    x 2  . TUBAL LIGATION    . VAGINAL  HYSTERECTOMY  2001    Family History: Family History  Problem Relation Age of Onset  . Lung cancer Mother 13       smoked heavily  . Emphysema Mother   . Hypertension Father   . Hyperlipidemia Father   . Diabetes Father   . Coronary artery disease Father   . Dementia Father   . COPD Father        smoked  . Stomach cancer Paternal Aunt   . Brain cancer Paternal Uncle   . Irritable bowel syndrome Unknown        Several family members on fathers side   . Diabetes Other   . Stomach cancer Maternal Aunt   . Lung cancer Paternal Uncle   . Heart disease Paternal Uncle   . Colon cancer Neg Hx      Social History: Social History   Socioeconomic History  . Marital status: Married    Spouse name: Not on file  . Number of children: 2  . Years of education: Not on file  . Highest education level: Not on file  Social Needs  . Financial resource strain: Not on file  . Food insecurity - worry: Not on file  . Food insecurity - inability: Not on file  . Transportation needs - medical: Not on file  . Transportation needs - non-medical: Not on file  Occupational History  . Occupation: Retired   Tobacco Use  . Smoking status: Never Smoker  . Smokeless tobacco: Never Used  Substance and Sexual Activity  . Alcohol use: No  . Drug use: No  . Sexual activity: Yes    Birth control/protection: Surgical  Other Topics Concern  . Not on file  Social History Narrative   Caffeine daily    [employer], [employer] no diploma   Married '66   1 dtr- '78; 1 son '71; 2 grandchildren   Occupation: retired 04   Dad with alzheimers-had to place in [place] (summer '10)          Allergies: Allergies  Allergen Reactions  . Codeine Other (See Comments)    Makes her crazy  . Propoxyphene Hcl Itching    Medications:  Current Outpatient Medications  Medication Sig Dispense Refill  . acetaminophen (TYLENOL) 500 MG tablet Take 500 mg by mouth every 8 (eight) hours as needed for mild pain.     Marland Kitchen albuterol (PROVENTIL HFA;VENTOLIN HFA) 108 (90 Base) MCG/ACT inhaler Inhale 2 puffs into the lungs every 6 (six) hours as needed for wheezing or shortness of breath. (Patient not taking: Reported on 03/13/2017) 1 Inhaler 0  . cetirizine (ZYRTEC) 10 MG tablet Take 1 tablet (10 mg total) by mouth daily. 90 tablet 1  . chlorpheniramine-HYDROcodone (TUSSIONEX PENNKINETIC ER) 10-8 MG/5ML SUER Take 5 mLs by mouth at bedtime as needed for cough. 100 mL 0  . cholecalciferol (VITAMIN D) 1000 units tablet Take 1 tablet by mouth daily.     Marland Kitchen doxycycline (VIBRA-TABS) 100 MG tablet TAKE 1 TABLET BY MOUTH TWICE A DAY 60 tablet 0  .  EQ ALLERGY RELIEF, CETIRIZINE, 10 MG tablet TAKE 1 TABLET BY MOUTH DAILY 90 tablet 1  . Fluocinolone Acetonide (DERMOTIC) 0.01 % OIL Place 1 drop in ear(s) daily as needed (itching).     . gabapentin (NEURONTIN) 100 MG capsule TAKE 2 CAPSULES BY MOUTH AT BEDTIME 180 capsule 1  . glucose blood (ONE TOUCH ULTRA TEST) test strip USE TO TEST BLOOD SUGAR THREE TIMES DAILY AS INSTRUCTED 300 each 1  .  insulin NPH Human (NOVOLIN N RELION) 100 UNIT/ML injection Inject under skin 25 units in am and 25 units at bedtime 20 mL 3  . Insulin Syringe-Needle U-100 (RELION INSULIN SYRINGE 1ML/31G) 31G X 5/16" 1 ML MISC USE 2 TIMES A DAY 200 each 3  . ipratropium (ATROVENT) 0.06 % nasal spray Place 2 sprays into both nostrils 4 (four) times daily. (Patient not taking: Reported on 03/13/2017) 15 mL 1  . losartan (COZAAR) 25 MG tablet Take 1 tablet (25 mg total) by mouth daily. 90 tablet 1  . lovastatin (MEVACOR) 20 MG tablet Take 1 tablet (20 mg total) by mouth at bedtime. 90 tablet 1  . metFORMIN (GLUCOPHAGE) 1000 MG tablet TAKE 1 TABLET TWICE A DAY WITH A MEAL 180 tablet 1  . metoprolol succinate (TOPROL-XL) 25 MG 24 hr tablet TAKE 1 TABLET BY MOUTH EVERY DAY 90 tablet 1  . naproxen sodium (ANAPROX) 220 MG tablet Take 220 mg by mouth daily as needed (pain).    . NOVOLIN R RELION 100 UNIT/ML injection INJECT 12 TO 30 UNITS INTO THE SKIN THREE TIMES DAILY BEFORE MEALS 20 mL 1  . omeprazole (PRILOSEC) 20 MG capsule Take 2 capsules (40 mg total) by mouth 2 (two) times daily before a meal. 360 capsule 3  . senna (EQ NATURAL LAXATIVE) 8.6 MG tablet Take 1 tablet by mouth daily.    . silver sulfADIAZINE (SILVADENE) 1 % cream Apply 1 application daily topically. Apply to foot    . vitamin B-12 (CYANOCOBALAMIN) 1000 MCG tablet Take 1,000 mcg daily by mouth.     Current Facility-Administered Medications  Medication Dose Route Frequency Provider Last Rate Last Dose  . 0.9 %  sodium chloride infusion  500 mL Intravenous Once  Ladene Artist, MD        Review of Systems: Review of Systems  Constitutional: Positive for fatigue.  Respiratory: Positive for cough.   Gastrointestinal: Positive for constipation and diarrhea.  All other systems reviewed and are negative.    PHYSICAL EXAMINATION Blood pressure (!) 147/56, pulse 83, temperature 98.1 F (36.7 C), temperature source Oral, resp. rate 20, height _0  (1.626 m), weight 233 lb 1.6 oz (105.7 kg), SpO2 99 %.  ECOG PERFORMANCE STATUS: 1 - Symptomatic but completely ambulatory  Physical Exam  Constitutional: She is oriented to person, place, and time and well-developed, well-nourished, and in no distress. No distress.  HENT:  Head: Normocephalic and atraumatic.  Mouth/Throat: Oropharynx is clear and moist. No oropharyngeal exudate.  Eyes: Conjunctivae and EOM are normal. Pupils are equal, round, and reactive to light. No scleral icterus.  Neck: No thyromegaly present.  Cardiovascular: Normal rate, regular rhythm and normal heart sounds.  No murmur heard. Pulmonary/Chest: Effort normal and breath sounds normal. No respiratory distress. She has no wheezes. She has no rales.  Abdominal: Soft. Bowel sounds are normal. She exhibits no distension and no mass. There is no tenderness. There is no guarding.  Musculoskeletal: She exhibits no edema.  Lymphadenopathy:    She has no cervical adenopathy.  Neurological: She is alert and oriented to person, place, and time. She has normal reflexes. No cranial nerve deficit.  Skin: Skin is warm and dry. No rash noted. She is not diaphoretic. No erythema. No pallor.     LABORATORY DATA: I have personally reviewed the data as listed: No visits with results within 1 Week(s) from this visit.  Latest known visit with results is:  Appointment on 02/24/2017  Component Date Value Ref  Range Status  . WBC Count 02/24/2017 9.5  3.9 - 10.3 K/uL Final  . RBC 02/24/2017 4.71  3.70 - 5.45 MIL/uL Final  . Hemoglobin 02/24/2017  10.8* 11.6 - 15.9 g/dL Final  . HCT 02/24/2017 35.2  34.8 - 46.6 % Final  . MCV 02/24/2017 74.7* 79.5 - 101.0 fL Final  . MCH 02/24/2017 23.0* 25.1 - 34.0 pg Final  . MCHC 02/24/2017 30.8* 31.5 - 36.0 g/dL Final  . RDW 02/24/2017 17.6* 11.2 - 14.5 % Final  . Platelet Count 02/24/2017 413* 145 - 400 K/uL Final  . Neutrophils Relative % 02/24/2017 60  % Final  . Neutro Abs 02/24/2017 5.7  1.5 - 6.5 K/uL Final  . Lymphocytes Relative 02/24/2017 29  % Final  . Lymphs Abs 02/24/2017 2.8  0.9 - 3.3 K/uL Final  . Monocytes Relative 02/24/2017 6  % Final  . Monocytes Absolute 02/24/2017 0.6  0.1 - 0.9 K/uL Final  . Eosinophils Relative 02/24/2017 4  % Final  . Eosinophils Absolute 02/24/2017 0.4  0.0 - 0.5 K/uL Final  . Basophils Relative 02/24/2017 1  % Final  . Basophils Absolute 02/24/2017 0.1  0.0 - 0.1 K/uL Final   Performed at [facility], [place] [address], [place], [place] [address]  . Sodium 02/24/2017 141  136 - 145 mmol/L Final  . Potassium 02/24/2017 4.1  3.5 - 5.1 mmol/L Final  . Chloride 02/24/2017 103  98 - 109 mmol/L Final  . CO2 02/24/2017 26  22 - 29 mmol/L Final  . Glucose, Bld 02/24/2017 164* 70 - 140 mg/dL Final  . BUN 02/24/2017 18  7 - 26 mg/dL Final  . Creatinine 02/24/2017 1.14* 0.60 - 1.10 mg/dL Final  . Calcium 02/24/2017 9.8  8.4 - 10.4 mg/dL Final  . Total Protein 02/24/2017 7.5  6.4 - 8.3 g/dL Final  . Albumin 02/24/2017 3.6  3.5 - 5.0 g/dL Final  . AST 02/24/2017 21  5 - 34 U/L Final  . ALT 02/24/2017 14  0 - 55 U/L Final  . Alkaline Phosphatase 02/24/2017 60  40 - 150 U/L Final  . Total Bilirubin 02/24/2017 <0.2* 0.2 - 1.2 mg/dL Final  . GFR, Est Non Af Am 02/24/2017 48* >60 mL/min Final  . GFR, Est AFR Am 02/24/2017 56* >60 mL/min Final   Comment: (NOTE) The eGFR has been calculated using the CKD EPI equation. This calculation has not been validated in all clinical situations. eGFR's persistently <60 mL/min signify possible Chronic  Kidney Disease.   Georgiann Hahn gap 02/24/2017 12* 3 - 11 Final   Performed at [facility], [place] [address], [place], [place] [address]  . Iron 02/24/2017 24* 28 - 170 ug/dL Final  . TIBC 02/24/2017 384  250 - 450 ug/dL Final  . Saturation Ratios 02/24/2017 6* 10.4 - 31.8 % Final  . UIBC 02/24/2017 360  ug/dL Final   Performed at [facility], [place] [address], [place], [place] [address]  . Ferritin 02/24/2017 24  11 - 307 ng/mL Final   Performed at [facility], [place] [address], [place], [place] [address]  . Sed Rate 02/24/2017 36* 0 - 22 mm/hr Final   Performed at [facility], [place] [address] [place], [place] [address]  . CRP 02/24/2017 1.2* <1.0 mg/dL Final   Performed at [place] [address], [place],  [address]       Ardath Sax, MD

## 2017-03-22 NOTE — Assessment & Plan Note (Signed)
73 y.o. female with microcytic hypochromic anemia in the setting of iron deficiency detected in January 2019.  Recent endoscopic evaluations show no frank gastrointestinal bleeding, but significant gastritis is noted that is a potential cause of intermittent GI blood loss.  In addition, patient has likely a chronic inflammatory state due to persistent diabetic ulcers which impair anemia recovery by creating deterioration consistent with anemia of chronic disease.  Time of the clinic visit, report from the gastric biopsy was pending.  Repeat blood work obtained during our last clinic visit confirmed presence of significant iron deficiency especially in the context of elevated inflammatory markers with ferritin 24 being likely artificially elevated by the inflammation reaction.  In the interim, hemoglobin has improved a little bit, but remains suppressed with significant microcytosis and hypochromia.  Interestingly, upper endoscopy demonstrates presence of significant gastritis likely attributable to side effects of the oral iron intake.   Plan: -Discontinue oral iron due to gastric irritation -Start patient on parenteral iron supplementation: Ferrlecit weekly x4 -Return to clinic in 6 weeks: Labs 2-3 days prior, clinic visit, possible additional parenteral iron administration.

## 2017-03-22 NOTE — Telephone Encounter (Signed)
You refilled the pts rx for Doxy yesterday and she is asking if she needs to take this. She is s/p a toe amputation 11/2016

## 2017-03-22 NOTE — Telephone Encounter (Signed)
Patient called asked if Dr Sharol Given want her to continue taking Doxycycline. Patient said the pharmacy told her husband that the Rx is ready for pick up. The number to contact patient is [phone]

## 2017-03-27 ENCOUNTER — Ambulatory Visit (INDEPENDENT_AMBULATORY_CARE_PROVIDER_SITE_OTHER): Payer: Medicare Other | Admitting: Orthopedic Surgery

## 2017-03-27 ENCOUNTER — Ambulatory Visit (INDEPENDENT_AMBULATORY_CARE_PROVIDER_SITE_OTHER): Payer: Medicare Other

## 2017-03-27 ENCOUNTER — Other Ambulatory Visit: Payer: Self-pay

## 2017-03-27 ENCOUNTER — Encounter (INDEPENDENT_AMBULATORY_CARE_PROVIDER_SITE_OTHER): Payer: Self-pay | Admitting: Orthopedic Surgery

## 2017-03-27 ENCOUNTER — Encounter (HOSPITAL_COMMUNITY): Payer: Self-pay | Admitting: *Deleted

## 2017-03-27 ENCOUNTER — Ambulatory Visit: Payer: Medicare Other | Admitting: Family Medicine

## 2017-03-27 ENCOUNTER — Inpatient Hospital Stay: Payer: Medicare Other

## 2017-03-27 VITALS — BP 133/58 | HR 73 | Temp 98.0°F | Resp 18

## 2017-03-27 VITALS — Ht 64.0 in | Wt 233.0 lb

## 2017-03-27 DIAGNOSIS — M25571 Pain in right ankle and joints of right foot: Secondary | ICD-10-CM

## 2017-03-27 DIAGNOSIS — M14672 Charcot's joint, left ankle and foot: Secondary | ICD-10-CM | POA: Diagnosis not present

## 2017-03-27 DIAGNOSIS — E11622 Type 2 diabetes mellitus with other skin ulcer: Secondary | ICD-10-CM | POA: Diagnosis not present

## 2017-03-27 DIAGNOSIS — D649 Anemia, unspecified: Secondary | ICD-10-CM

## 2017-03-27 DIAGNOSIS — I10 Essential (primary) hypertension: Secondary | ICD-10-CM | POA: Diagnosis not present

## 2017-03-27 DIAGNOSIS — E1140 Type 2 diabetes mellitus with diabetic neuropathy, unspecified: Secondary | ICD-10-CM | POA: Diagnosis not present

## 2017-03-27 DIAGNOSIS — Z86010 Personal history of colonic polyps: Secondary | ICD-10-CM | POA: Diagnosis not present

## 2017-03-27 DIAGNOSIS — D509 Iron deficiency anemia, unspecified: Secondary | ICD-10-CM | POA: Diagnosis not present

## 2017-03-27 DIAGNOSIS — K2970 Gastritis, unspecified, without bleeding: Secondary | ICD-10-CM | POA: Diagnosis not present

## 2017-03-27 DIAGNOSIS — M86271 Subacute osteomyelitis, right ankle and foot: Secondary | ICD-10-CM

## 2017-03-27 MED ORDER — SODIUM CHLORIDE 0.9 % IV SOLN
125.0000 mg | Freq: Once | INTRAVENOUS | Status: AC
  Administered 2017-03-27: 125 mg via INTRAVENOUS
  Filled 2017-03-27: qty 10

## 2017-03-27 MED ORDER — SODIUM CHLORIDE 0.9 % IV SOLN
Freq: Once | INTRAVENOUS | Status: AC
  Administered 2017-03-27: 15:00:00 via INTRAVENOUS

## 2017-03-27 NOTE — Progress Notes (Signed)
Spoke with pt for pre-op call. Pt has hx of SVT and takes Metoprolol. States heart rate has been regular rhythm. Pt is a type 2 diabetic. Last A1C was 7.3 on 12/26/16. Pt instructed to take 1/2 of her regular dose of Novolin N Tuesday evening and Wednesday AM. Pt instructed not to take bedtime does of Novolin R. Pt instructed to check her blood sugar when she gets up Wednesday AM and every 2 hours until she leaves for the hospital. If blood sugar is 70 or below, treat with 1/2 cup of clear juice (apple or cranberry) and recheck blood sugar 15 minutes after drinking juice. If blood sugar continues to be 70 or below, call the Short Stay department and ask to speak to a nurse. Pt voiced understanding.  Pt states she is receiving iron infusions at [facility] for the last 3 weeks.

## 2017-03-27 NOTE — Patient Instructions (Signed)
Sodium Ferric Gluconate Complex injection What is this medicine? SODIUM FERRIC GLUCONATE COMPLEX (SOE dee um FER ik GLOO koe nate KOM pleks) is an iron replacement. It is used with epoetin therapy to treat low iron levels in patients who are receiving hemodialysis. This medicine may be used for other purposes; ask your health care provider or pharmacist if you have questions. COMMON BRAND NAME(S): Ferrlecit, Nulecit What should I tell my health care provider before I take this medicine? They need to know if you have any of the following conditions: -anemia that is not from iron deficiency -high levels of iron in the body -an unusual or allergic reaction to iron, benzyl alcohol, other medicines, foods, dyes, or preservatives -pregnant or are trying to become pregnant -breast-feeding How should I use this medicine? This medicine is for infusion into a vein. It is given by a health care professional in a hospital or clinic setting. Talk to your pediatrician regarding the use of this medicine in children. While this drug may be prescribed for children as young as 6 years old for selected conditions, precautions do apply. Overdosage: If you think you have taken too much of this medicine contact a poison control center or emergency room at once. NOTE: This medicine is only for you. Do not share this medicine with others. What if I miss a dose? It is important not to miss your dose. Call your doctor or health care professional if you are unable to keep an appointment. What may interact with this medicine? Do not take this medicine with any of the following medications: -deferoxamine -dimercaprol -other iron products This medicine may also interact with the following medications: -chloramphenicol -deferasirox -medicine for blood pressure like enalapril This list may not describe all possible interactions. Give your health care provider a list of all the medicines, herbs, non-prescription drugs,  or dietary supplements you use. Also tell them if you smoke, drink alcohol, or use illegal drugs. Some items may interact with your medicine. What should I watch for while using this medicine? Your condition will be monitored carefully while you are receiving this medicine. Visit your doctor for check-ups as directed. What side effects may I notice from receiving this medicine? Side effects that you should report to your doctor or health care professional as soon as possible: -allergic reactions like skin rash, itching or hives, swelling of the face, lips, or tongue -breathing problems -changes in hearing -changes in vision -chills, flushing, or sweating -fast, irregular heartbeat -feeling faint or lightheaded, falls -fever, flu-like symptoms -high or low blood pressure -pain, tingling, numbness in the hands or feet -severe pain in the chest, back, flanks, or groin -swelling of the ankles, feet, hands -trouble passing urine or change in the amount of urine -unusually weak or tired Side effects that usually do not require medical attention (report to your doctor or health care professional if they continue or are bothersome): -cramps -dark colored stools -diarrhea -headache -nausea, vomiting -stomach upset This list may not describe all possible side effects. Call your doctor for medical advice about side effects. You may report side effects to FDA at 1-800-FDA-1088. Where should I keep my medicine? This drug is given in a hospital or clinic and will not be stored at home. NOTE: This sheet is a summary. It may not cover all possible information. If you have questions about this medicine, talk to your doctor, pharmacist, or health care provider.  2018 Elsevier/Gold Standard (2007-08-29 15:58:57)  

## 2017-03-27 NOTE — Progress Notes (Signed)
Office Visit Note   [name]           Date of Birth: September 20, 1945           MRN: [id number] Visit Date: 03/27/2017              Requested by: Lucretia Kern, DO [address] [place], [place] [address] PCP: Lucretia Kern, DO  Chief Complaint  Patient presents with  . Right Foot - Wound Check  . 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  Patient has had a chronic ulcer beneath her third metatarsal head.  She is just completed a one-month course of doxycycline she is used felt relieving pads she has taken probiotics she is worn compression stockings.  She states the ulcer is deeper.  Assessment & Plan: Visit Diagnoses:  1. Subacute osteomyelitis, right ankle and foot (HCC)   2. Charcot's joint, left ankle and foot     Plan: Will need to proceed with amputation of the third and fourth metatarsal heads.  We will plan for surgery Wednesday.  Patient will be nonweightbearing for 2 weeks.  Risks and benefits were discussed patient states she understands wished to proceed at this time.  Follow-Up Instructions: Return in about 2 weeks (around 04/10/2017).   Ortho Exam  Patient is alert, oriented, no adenopathy, well-dressed, normal affect, normal respiratory effort. Examination patient has a good dorsalis pedis pulse bilaterally there is no redness no cellulitis.  She has a chronic ulcer beneath the third metatarsal head of the right foot.  This probes down to the third and fourth metatarsal heads.  The ulcer is 5 mm in diameter and 5 mm deep.  Left foot she has bony spurs at the base of the first metatarsal radiographs were obtained for the possibility of Charcot arthropathy and this shows arthritic changes no redness no swelling no signs of infection or active Charcot process left foot.  Imaging: Xr Foot Complete Left  Result Date: 03/27/2017 3 view  radiographs of the left foot shows arthritic changes base of the first metatarsal medial cuneiform no Charcot destructive changes.  Xr Foot Complete Right  Result Date: 03/27/2017 3 view radiographs of the right foot shows silver nitrate that extends down to the third metatarsal head consistent with osteomyelitis.  No images are attached to the encounter.  Labs: Lab Results  Component Value Date   HGBA1C 7.3 12/26/2016   HGBA1C 7.0 09/19/2016   HGBA1C 7.6 06/20/2016   ESRSEDRATE 36 (H) 02/24/2017   CRP 1.2 (H) 02/24/2017   LABURIC 5.5 09/01/2015   LABORGA GROUP B STREP (S.AGALACTIAE) ISOLATED 12/26/2014    @LABSALLVALUES (HGBA1)@  Body mass index is 39.99 kg/m.  Orders:  Orders Placed This Encounter  Procedures  . XR Foot Complete Right  . XR Foot Complete Left   No orders of the defined types were placed in this encounter.    Procedures: No procedures performed  Clinical Data: No additional findings.  ROS:  All other systems negative, except as noted in the HPI. Review of Systems  Objective: Vital Signs: Ht 5\' 4"  (1.626 m)   Wt 233 lb (105.7 kg)   BMI 39.99 kg/m   Specialty Comments:  No specialty comments available.  PMFS History: Patient Active Problem List   Diagnosis Date Noted  . Anemia 02/24/2017  . Right foot ulcer, limited to breakdown of  skin ([place]) 09/26/2016  . Osteomyelitis of toe of left foot ([place])   . Baker's cyst 07/10/2016  . Tachycardia 03/04/2016  . Onychomycosis 12/07/2015  . Non-pressure chronic ulcer of other part of right foot limited to breakdown of skin ([place]) 12/07/2015  . Upper airway cough syndrome 03/05/2014  . S/P amputation of lesser toe - followed by Dr. Sharol Given 04/15/2013  . Pain in joint, shoulder region, s/p RTC surgery - followed by Dr. Sharol Given 04/15/2013  . Essential hypertension 12/05/2006  . Uncontrolled type 2 diabetes mellitus with peripheral circulatory disorder ([place]) 10/12/2006  . Hyperlipidemia 10/12/2006  .  Allergic rhinitis 10/12/2006  . GERD - Followed by Dr. Fuller Plan 10/12/2006  . IRRITABLE BOWEL SYNDROME - followed by Dr. Fuller Plan 10/12/2006  . Peripheral neuropathy 10/11/2006  . ALOPECIA NEC 10/11/2006   Past Medical History:  Diagnosis Date  . Alopecia   . Anemia, mild   . Arthritis   . Chronic cough    sees pulmonologist  . Chronic pain    chest wall and abd - s/p extensive eval  . Diabetes mellitus with neuropathy ([place])    sees endocrine  . Diverticulosis   . Fatty liver   . GERD (gastroesophageal reflux disease)    takes Nexium bid, hx erosive esophagitis  . Headache(784.0)    occasionally;r/t sinus   . History of colon polyps   . HTN (hypertension)   . Hx of amputation of lesser toe ([place])    sees podiatrist  . Hyperlipemia   . IBS (irritable bowel syndrome)   . Insomnia    takes Elavil nightly  . Joint pain   . Neuropathy   . Osteomyelitis ([place])   . Pneumonia   . PONV (postoperative nausea and vomiting)   . Seasonal allergies    takes Zyrtec daily  . Sinus tachycardia     Family History  Problem Relation Age of Onset  . Lung cancer Mother 40       smoked heavily  . Emphysema Mother   . Hypertension Father   . Hyperlipidemia Father   . Diabetes Father   . Coronary artery disease Father   . Dementia Father   . COPD Father        smoked  . Stomach cancer Paternal Aunt   . Brain cancer Paternal Uncle   . Irritable bowel syndrome Unknown        Several family members on fathers side   . Diabetes Other   . Stomach cancer Maternal Aunt   . Lung cancer Paternal Uncle   . Heart disease Paternal Uncle   . Colon cancer Neg Hx     Past Surgical History:  Procedure Laterality Date  . AMPUTATION  12/28/2011   Procedure: AMPUTATION DIGIT;  Surgeon: Newt Minion, MD;  Location: [facility];  Service: Orthopedics;  Laterality: Right;  Right Foot 2nd Toe Amputation at MTP (metatarsophalangeal joint)  . AMPUTATION Right 04/25/2012   Procedure: Right Foot 3rd Toe Amputation;   Surgeon: Newt Minion, MD;  Location: [facility];  Service: Orthopedics;  Laterality: Right;  Right Foot Third Toe Amputation   . AMPUTATION Right 07/27/2012   Procedure: Right 4th Toe Amputation at Metatarsophalangeal;  Surgeon: Newt Minion, MD;  Location: [facility];  Service: Orthopedics;  Laterality: Right;  Right 4th Toe Amputation at Metatarsophalangeal  . AMPUTATION Left 07/15/2016   Procedure: Left 2nd Ray Amputation;  Surgeon: Newt Minion, MD;  Location: [facility];  Service: Orthopedics;  Laterality: Left;  .  AMPUTATION Left 11/18/2016   Procedure: Left 3rd and 4th Ray Amputation;  Surgeon: Newt Minion, MD;  Location: [facility];  Service: Orthopedics;  Laterality: Left;  . COLONOSCOPY    . LAPAROSCOPIC APPENDECTOMY  01/05/2011   Procedure: APPENDECTOMY LAPAROSCOPIC;  Surgeon: Pedro Earls, MD;  Location: [facility];  Service: General;  Laterality: N/A;  . OOPHORECTOMY  2001  . ROTATOR CUFF REPAIR Right    x 2  . TUBAL LIGATION    . VAGINAL HYSTERECTOMY  2001   Social History   Occupational History  . Occupation: Retired   Tobacco Use  . Smoking status: Never Smoker  . Smokeless tobacco: Never Used  Substance and Sexual Activity  . Alcohol use: No  . Drug use: No  . Sexual activity: Yes    Birth control/protection: Surgical

## 2017-03-28 ENCOUNTER — Other Ambulatory Visit (INDEPENDENT_AMBULATORY_CARE_PROVIDER_SITE_OTHER): Payer: Self-pay | Admitting: Orthopedic Surgery

## 2017-03-28 DIAGNOSIS — M86271 Subacute osteomyelitis, right ankle and foot: Secondary | ICD-10-CM

## 2017-03-29 ENCOUNTER — Ambulatory Visit (HOSPITAL_COMMUNITY)
Admission: RE | Admit: 2017-03-29 | Discharge: 2017-03-29 | Disposition: A | Discharge status: Home / Self Care | Payer: Medicare Other | Source: Ambulatory Visit | Attending: Orthopedic Surgery | Admitting: Orthopedic Surgery

## 2017-03-29 ENCOUNTER — Encounter (HOSPITAL_COMMUNITY): Payer: Self-pay | Admitting: Surgery

## 2017-03-29 ENCOUNTER — Ambulatory Visit (HOSPITAL_COMMUNITY): Payer: Medicare Other | Admitting: Anesthesiology

## 2017-03-29 ENCOUNTER — Other Ambulatory Visit: Payer: Self-pay

## 2017-03-29 ENCOUNTER — Encounter (HOSPITAL_COMMUNITY)
Admission: RE | Disposition: A | Discharge status: Home / Self Care | Payer: Self-pay | Source: Ambulatory Visit | Attending: Orthopedic Surgery

## 2017-03-29 DIAGNOSIS — M86272 Subacute osteomyelitis, left ankle and foot: Secondary | ICD-10-CM

## 2017-03-29 DIAGNOSIS — M869 Osteomyelitis, unspecified: Secondary | ICD-10-CM | POA: Insufficient documentation

## 2017-03-29 DIAGNOSIS — E785 Hyperlipidemia, unspecified: Secondary | ICD-10-CM | POA: Diagnosis not present

## 2017-03-29 DIAGNOSIS — G8929 Other chronic pain: Secondary | ICD-10-CM | POA: Diagnosis not present

## 2017-03-29 DIAGNOSIS — Z79899 Other long term (current) drug therapy: Secondary | ICD-10-CM | POA: Insufficient documentation

## 2017-03-29 DIAGNOSIS — K589 Irritable bowel syndrome without diarrhea: Secondary | ICD-10-CM | POA: Insufficient documentation

## 2017-03-29 DIAGNOSIS — Z885 Allergy status to narcotic agent status: Secondary | ICD-10-CM | POA: Insufficient documentation

## 2017-03-29 DIAGNOSIS — Z86010 Personal history of colonic polyps: Secondary | ICD-10-CM | POA: Insufficient documentation

## 2017-03-29 DIAGNOSIS — L97519 Non-pressure chronic ulcer of other part of right foot with unspecified severity: Secondary | ICD-10-CM | POA: Insufficient documentation

## 2017-03-29 DIAGNOSIS — Z6839 Body mass index (BMI) 39.0-39.9, adult: Secondary | ICD-10-CM | POA: Diagnosis not present

## 2017-03-29 DIAGNOSIS — K219 Gastro-esophageal reflux disease without esophagitis: Secondary | ICD-10-CM | POA: Insufficient documentation

## 2017-03-29 DIAGNOSIS — M86271 Subacute osteomyelitis, right ankle and foot: Secondary | ICD-10-CM | POA: Diagnosis not present

## 2017-03-29 DIAGNOSIS — E11621 Type 2 diabetes mellitus with foot ulcer: Secondary | ICD-10-CM | POA: Insufficient documentation

## 2017-03-29 DIAGNOSIS — Z791 Long term (current) use of non-steroidal anti-inflammatories (NSAID): Secondary | ICD-10-CM | POA: Diagnosis not present

## 2017-03-29 DIAGNOSIS — G4700 Insomnia, unspecified: Secondary | ICD-10-CM | POA: Diagnosis not present

## 2017-03-29 DIAGNOSIS — E1169 Type 2 diabetes mellitus with other specified complication: Secondary | ICD-10-CM | POA: Insufficient documentation

## 2017-03-29 DIAGNOSIS — Z89421 Acquired absence of other right toe(s): Secondary | ICD-10-CM | POA: Insufficient documentation

## 2017-03-29 DIAGNOSIS — Z89422 Acquired absence of other left toe(s): Secondary | ICD-10-CM | POA: Insufficient documentation

## 2017-03-29 DIAGNOSIS — Z794 Long term (current) use of insulin: Secondary | ICD-10-CM | POA: Diagnosis not present

## 2017-03-29 DIAGNOSIS — E1140 Type 2 diabetes mellitus with diabetic neuropathy, unspecified: Secondary | ICD-10-CM | POA: Insufficient documentation

## 2017-03-29 DIAGNOSIS — I10 Essential (primary) hypertension: Secondary | ICD-10-CM | POA: Diagnosis not present

## 2017-03-29 DIAGNOSIS — L97511 Non-pressure chronic ulcer of other part of right foot limited to breakdown of skin: Secondary | ICD-10-CM | POA: Diagnosis not present

## 2017-03-29 HISTORY — PX: AMPUTATION: SHX166

## 2017-03-29 LAB — BASIC METABOLIC PANEL
Anion gap: 10 (ref 5–15)
BUN: 13 mg/dL (ref 6–20)
CO2: 26 mmol/L (ref 22–32)
Calcium: 9.2 mg/dL (ref 8.9–10.3)
Chloride: 103 mmol/L (ref 101–111)
Creatinine, Ser: 0.74 mg/dL (ref 0.44–1.00)
GFR calc Af Amer: >60 mL/min (ref >60)
GFR calc non Af Amer: >60 mL/min (ref >60)
Glucose, Bld: 190 mg/dL — ABNORMAL HIGH (ref 65–99)
Potassium: 3.8 mmol/L (ref 3.5–5.1)
Sodium: 139 mmol/L (ref 135–145)

## 2017-03-29 LAB — GLUCOSE, CAPILLARY
Glucose-Capillary: 177 mg/dL — ABNORMAL HIGH (ref 65–99)
Glucose-Capillary: 152 mg/dL — ABNORMAL HIGH (ref 65–99)
Glucose-Capillary: 151 mg/dL — ABNORMAL HIGH (ref 65–99)

## 2017-03-29 LAB — CBC
HCT: 37.8 % (ref 36.0–46.0)
Hemoglobin: 11.4 g/dL — ABNORMAL LOW (ref 12.0–15.0)
MCH: 24 pg — ABNORMAL LOW (ref 26.0–34.0)
MCHC: 30.2 g/dL (ref 30.0–36.0)
MCV: 79.6 fL (ref 78.0–100.0)
Platelets: 393 10*3/uL (ref 150–400)
RBC: 4.75 MIL/uL (ref 3.87–5.11)
RDW: 17.7 % — ABNORMAL HIGH (ref 11.5–15.5)
WBC: 10.5 10*3/uL (ref 4.0–10.5)

## 2017-03-29 LAB — HEMOGLOBIN A1C
Hgb A1c MFr Bld: 7.4 % — ABNORMAL HIGH (ref 4.8–5.6)
Mean Plasma Glucose: 165.68 mg/dL

## 2017-03-29 SURGERY — AMPUTATION, FOOT, RAY
Anesthesia: General | Site: Foot | Laterality: Right

## 2017-03-29 MED ORDER — PROPOFOL 10 MG/ML IV BOLUS
INTRAVENOUS | Status: AC
  Filled 2017-03-29: qty 20

## 2017-03-29 MED ORDER — 0.9 % SODIUM CHLORIDE (POUR BTL) OPTIME
TOPICAL | Status: DC | PRN
  Administered 2017-03-29 (×2): 1000 mL

## 2017-03-29 MED ORDER — CHLORHEXIDINE GLUCONATE 4 % EX LIQD: 60.0000 mL | Freq: Once | CUTANEOUS | Status: DC

## 2017-03-29 MED ORDER — DEXMEDETOMIDINE HCL IN NACL 200 MCG/50ML IV SOLN
INTRAVENOUS | Status: AC
Start: 2017-03-29 — End: 2017-03-29
  Filled 2017-03-29: qty 50

## 2017-03-29 MED ORDER — PROPOFOL 10 MG/ML IV BOLUS
INTRAVENOUS | Status: DC | PRN
  Administered 2017-03-29: 100 mg via INTRAVENOUS

## 2017-03-29 MED ORDER — MIDAZOLAM HCL 2 MG/2ML IJ SOLN
INTRAMUSCULAR | Status: AC
Start: 2017-03-29 — End: 2017-03-29
  Filled 2017-03-29: qty 2

## 2017-03-29 MED ORDER — ONDANSETRON HCL 4 MG/2ML IJ SOLN
INTRAMUSCULAR | Status: DC | PRN
  Administered 2017-03-29: 4 mg via INTRAVENOUS

## 2017-03-29 MED ORDER — LACTATED RINGERS IV SOLN
INTRAVENOUS | Status: DC | PRN
  Administered 2017-03-29: 10:00:00 via INTRAVENOUS

## 2017-03-29 MED ORDER — MIDAZOLAM HCL 5 MG/5ML IJ SOLN
INTRAMUSCULAR | Status: DC | PRN
  Administered 2017-03-29: 2 mg via INTRAVENOUS

## 2017-03-29 MED ORDER — HYDROCODONE-ACETAMINOPHEN 5-325 MG PO TABS: 1.0000 | ORAL_TABLET | ORAL | 0 refills | Status: DC | PRN

## 2017-03-29 MED ORDER — CEFAZOLIN SODIUM-DEXTROSE 2-4 GM/100ML-% IV SOLN
2.0000 g | INTRAVENOUS | Status: AC
  Administered 2017-03-29: 2 g via INTRAVENOUS

## 2017-03-29 MED ORDER — DEXAMETHASONE SODIUM PHOSPHATE 10 MG/ML IJ SOLN
INTRAMUSCULAR | Status: DC | PRN
  Administered 2017-03-29: 5 mg via INTRAVENOUS

## 2017-03-29 MED ORDER — FENTANYL CITRATE (PF) 100 MCG/2ML IJ SOLN
INTRAMUSCULAR | Status: DC | PRN
  Administered 2017-03-29: 100 ug via INTRAVENOUS

## 2017-03-29 MED ORDER — CEFAZOLIN SODIUM-DEXTROSE 2-4 GM/100ML-% IV SOLN
INTRAVENOUS | Status: AC
  Filled 2017-03-29: qty 100

## 2017-03-29 MED ORDER — FENTANYL CITRATE (PF) 250 MCG/5ML IJ SOLN
INTRAMUSCULAR | Status: AC
  Filled 2017-03-29: qty 5

## 2017-03-29 MED ORDER — LIDOCAINE HCL (CARDIAC) 20 MG/ML IV SOLN
INTRAVENOUS | Status: DC | PRN
  Administered 2017-03-29: 60 mg via INTRAVENOUS

## 2017-03-29 SURGICAL SUPPLY — 31 items
BLADE SAW SGTL MED 73X18.5 STR (BLADE) IMPLANT
BLADE SURG 21 STRL SS (BLADE) ×2 IMPLANT
BNDG COHESIVE 4X5 TAN STRL (GAUZE/BANDAGES/DRESSINGS) ×2 IMPLANT
BNDG GAUZE ELAST 4 BULKY (GAUZE/BANDAGES/DRESSINGS) ×4 IMPLANT
COVER SURGICAL LIGHT HANDLE (MISCELLANEOUS) ×2 IMPLANT
DRAPE U-SHAPE 47X51 STRL (DRAPES) ×4 IMPLANT
DRSG ADAPTIC 3X8 NADH LF (GAUZE/BANDAGES/DRESSINGS) ×2 IMPLANT
DRSG PAD ABDOMINAL 8X10 ST (GAUZE/BANDAGES/DRESSINGS) ×4 IMPLANT
DURAPREP 26ML APPLICATOR (WOUND CARE) ×2 IMPLANT
ELECT REM PT RETURN 9FT ADLT (ELECTROSURGICAL) ×2
ELECTRODE REM PT RTRN 9FT ADLT (ELECTROSURGICAL) ×1 IMPLANT
GAUZE SPONGE 4X4 12PLY STRL (GAUZE/BANDAGES/DRESSINGS) ×2 IMPLANT
GLOVE BIOGEL PI IND STRL 7.0 (GLOVE) ×2 IMPLANT
GLOVE BIOGEL PI IND STRL 9 (GLOVE) ×1 IMPLANT
GLOVE BIOGEL PI INDICATOR 7.0 (GLOVE) ×2
GLOVE BIOGEL PI INDICATOR 9 (GLOVE) ×1
GLOVE SURG ORTHO 9.0 STRL STRW (GLOVE) ×2 IMPLANT
GLOVE SURG SS PI 6.5 STRL IVOR (GLOVE) ×6 IMPLANT
GOWN STRL REUS W/ TWL XL LVL3 (GOWN DISPOSABLE) ×2 IMPLANT
GOWN STRL REUS W/TWL XL LVL3 (GOWN DISPOSABLE) ×2
KIT BASIN OR (CUSTOM PROCEDURE TRAY) ×2 IMPLANT
KIT ROOM TURNOVER OR (KITS) ×2 IMPLANT
NS IRRIG 1000ML POUR BTL (IV SOLUTION) ×4 IMPLANT
PACK ORTHO EXTREMITY (CUSTOM PROCEDURE TRAY) ×2 IMPLANT
PAD ARMBOARD 7.5X6 YLW CONV (MISCELLANEOUS) ×2 IMPLANT
SPONGE LAP 18X18 5 PK (GAUZE/BANDAGES/DRESSINGS) ×2 IMPLANT
STOCKINETTE IMPERVIOUS LG (DRAPES) ×2 IMPLANT
SUT ETHILON 2 0 PSLX (SUTURE) ×4 IMPLANT
TOWEL OR 17X26 10 PK STRL BLUE (TOWEL DISPOSABLE) ×2 IMPLANT
TUBE CONNECTING 12X1/4 (SUCTIONS) ×2 IMPLANT
YANKAUER SUCT BULB TIP NO VENT (SUCTIONS) ×2 IMPLANT

## 2017-03-29 NOTE — Progress Notes (Signed)
Orthopedic Tech Progress Note Patient Details:  Beth Holden [birth year] [id number]  Ortho Devices Type of Ortho Device: Postop shoe/boot Ortho Device/Splint Location: rle Ortho Device/Splint Interventions: Application   Post Interventions Patient Tolerated: Well Instructions Provided: Care of device Viewed order from doctor's order list  Hildred Priest 03/29/2017, 1:09 PM

## 2017-03-29 NOTE — H&P (Signed)
Beth Holden is an 72 y.o. female.   Chief Complaint: Chronic ulcer right forefoot HPI: Patient is a 72 year old woman with diabetic insensate neuropathy status post partial foot amputations on both feet who presents with a chronic ulcer beneath the third metatarsal head right foot with osteomyelitis.  Past Medical History:  Diagnosis Date  . Alopecia   . Anemia, mild   . Arthritis   . Chronic cough    sees pulmonologist  . Chronic pain    chest wall and abd - s/p extensive eval  . Diabetes mellitus with neuropathy ([place])    sees endocrine  . Diverticulosis   . Fatty liver   . GERD (gastroesophageal reflux disease)    takes Nexium bid, hx erosive esophagitis  . Headache(784.0)    occasionally;r/t sinus   . History of colon polyps   . HTN (hypertension)   . Hx of amputation of lesser toe ([place])    sees podiatrist  . Hyperlipemia   . IBS (irritable bowel syndrome)   . Insomnia    takes Elavil nightly  . Joint pain   . Neuropathy   . Osteomyelitis ([place])   . Pneumonia   . PONV (postoperative nausea and vomiting)   . Seasonal allergies    takes Zyrtec daily  . Sinus tachycardia     Past Surgical History:  Procedure Laterality Date  . AMPUTATION  12/28/2011   Procedure: AMPUTATION DIGIT;  Surgeon: Newt Minion, MD;  Location: [facility];  Service: Orthopedics;  Laterality: Right;  Right Foot 2nd Toe Amputation at MTP (metatarsophalangeal joint)  . AMPUTATION Right 04/25/2012   Procedure: Right Foot 3rd Toe Amputation;  Surgeon: Newt Minion, MD;  Location: [facility];  Service: Orthopedics;  Laterality: Right;  Right Foot Third Toe Amputation   . AMPUTATION Right 07/27/2012   Procedure: Right 4th Toe Amputation at Metatarsophalangeal;  Surgeon: Newt Minion, MD;  Location: [facility];  Service: Orthopedics;  Laterality: Right;  Right 4th Toe Amputation at Metatarsophalangeal  . AMPUTATION Left 07/15/2016   Procedure: Left 2nd Ray Amputation;  Surgeon: Newt Minion, MD;  Location: [facility];   Service: Orthopedics;  Laterality: Left;  . AMPUTATION Left 11/18/2016   Procedure: Left 3rd and 4th Ray Amputation;  Surgeon: Newt Minion, MD;  Location: [facility];  Service: Orthopedics;  Laterality: Left;  . COLONOSCOPY    . LAPAROSCOPIC APPENDECTOMY  01/05/2011   Procedure: APPENDECTOMY LAPAROSCOPIC;  Surgeon: Pedro Earls, MD;  Location[facility];  Service: General;  Laterality: N/A;  . OOPHORECTOMY  2001  . ROTATOR CUFF REPAIR Right    x 2  . TUBAL LIGATION    . VAGINAL HYSTERECTOMY  2001    Family History  Problem Relation Age of Onset  . Lung cancer Mother 72       smoked heavily  . Emphysema Mother   . Hypertension Father   . Hyperlipidemia Father   . Diabetes Father   . Coronary artery disease Father   . Dementia Father   . COPD Father        smoked  . Stomach cancer Paternal Aunt   . Brain cancer Paternal Uncle   . Irritable bowel syndrome Unknown        Several family members on fathers side   . Diabetes Other   . Stomach cancer Maternal Aunt   . Lung cancer Paternal Uncle   . Heart disease Paternal Uncle   . Colon cancer Neg Hx  Social History:  reports that  has never smoked. she has never used smokeless tobacco. She reports that she does not drink alcohol or use drugs.  Allergies:  Allergies  Allergen Reactions  . Codeine Other (See Comments)    Makes her crazy  . Propoxyphene Hcl Itching    *DARVOCET     No medications prior to admission.    No results found for this or any previous visit (from the past 48 hour(s)). Xr Foot Complete Left  Result Date: 03/27/2017 3 view radiographs of the left foot shows arthritic changes base of the first metatarsal medial cuneiform no Charcot destructive changes.  Xr Foot Complete Right  Result Date: 03/27/2017 3 view radiographs of the right foot shows silver nitrate that extends down to the third metatarsal head consistent with osteomyelitis.   Review of Systems  All other systems reviewed and are  negative.   There were no vitals taken for this visit. Physical Exam  Examination patient is alert oriented no adenopathy well-dressed normal affect normal respiratory effort she does have an antalgic gait.  She has good pulses bilaterally.  On examination patient has an ulcer which probes down to the third and fourth metatarsal heads.  This is a chronic Waggoner grade 3 ulceration.  There is no ascending cellulitis no purulent drainage.  Radiographs do not show any destructive bony changes. Assessment/Plan Assessment: Diabetic insensate neuropathy with chronic ulceration Waggoner grade 3 with osteomyelitis third metatarsal head right foot.  Plan: We will plan for third and fourth ray amputations right foot risks and benefits were discussed including risk of the wound not healing.  Patient states she understands wishes to proceed at this time.  Newt Minion, MD 03/29/2017, 6:25 AM

## 2017-03-29 NOTE — Transfer of Care (Signed)
Immediate Anesthesia Transfer of Care Note  Patient: Arneshia Ade  Procedure(s) Performed: RIGHT FOOT 3RD AND 4TH RAY AMPUTATION (Right Foot)  Patient Location: PACU  Anesthesia Type:General  Level of Consciousness: awake and alert   Airway & Oxygen Therapy: Patient Spontanous Breathing and Patient connected to nasal cannula oxygen  Post-op Assessment: Report given to RN and Post -op Vital signs reviewed and stable  Post vital signs: Reviewed and stable  Last Vitals:  Vitals:   03/29/17 0753  BP: (!) 165/71  Pulse: 81  Resp: 18  Temp: 36.9 C  SpO2: 98%    Last Pain:  Vitals:   03/29/17 0814  TempSrc:   PainSc: 1       Patients Stated Pain Goal: 3 (77/11/65 7903)  Complications: No apparent anesthesia complications

## 2017-03-29 NOTE — Anesthesia Preprocedure Evaluation (Signed)
Anesthesia Evaluation  Patient identified by MRN, date of birth, ID band Patient awake    Reviewed: Allergy & Precautions, NPO status , Patient's Chart, lab work & pertinent test results, reviewed documented beta blocker date and time   History of Anesthesia Complications (+) PONV and history of anesthetic complications  Airway Mallampati: I  TM Distance: >3 FB Neck ROM: Full    Dental no notable dental hx. (+) Teeth Intact   Pulmonary pneumonia, resolved,    Pulmonary exam normal breath sounds clear to auscultation       Cardiovascular hypertension, Pt. on medications and Pt. on home beta blockers + Peripheral Vascular Disease  Normal cardiovascular exam Rhythm:Regular Rate:Normal     Neuro/Psych  Headaches, Diabetic peripheral neuropathy  Neuromuscular disease negative psych ROS   GI/Hepatic Neg liver ROS, GERD  Medicated and Controlled,Hx/o Diverticulosis   Endo/Other  diabetes, Poorly Controlled, Type 2, Insulin Dependent, Oral Hypoglycemic AgentsMorbid obesityHyperlipidemia  Renal/GU negative Renal ROS  negative genitourinary   Musculoskeletal  (+) Arthritis , Osteoarthritis,    Abdominal   Peds  Hematology  (+) anemia ,   Anesthesia Other Findings   Reproductive/Obstetrics                             Anesthesia Physical Anesthesia Plan  ASA: III  Anesthesia Plan: General   Post-op Pain Management:    Induction: Intravenous  PONV Risk Score and Plan: 4 or greater and Midazolam, Propofol infusion, Ondansetron, Dexamethasone, Treatment may vary due to age or medical condition and Scopolamine patch - Pre-op  Airway Management Planned: LMA  Additional Equipment:   Intra-op Plan:   Post-operative Plan: Extubation in OR  Informed Consent: I have reviewed the patients History and Physical, chart, labs and discussed the procedure including the risks, benefits and alternatives  for the proposed anesthesia with the patient or authorized representative who has indicated his/her understanding and acceptance.   Dental advisory given  Plan Discussed with: CRNA, Anesthesiologist and Surgeon  Anesthesia Plan Comments:         Anesthesia Quick Evaluation

## 2017-03-29 NOTE — Anesthesia Postprocedure Evaluation (Signed)
Anesthesia Post Note  Patient: Denishia Citro  Procedure(s) Performed: RIGHT FOOT 3RD AND 4TH RAY AMPUTATION (Right Foot)     Patient location during evaluation: PACU Anesthesia Type: General Level of consciousness: awake and alert Pain management: pain level controlled Vital Signs Assessment: post-procedure vital signs reviewed and stable Respiratory status: spontaneous breathing, nonlabored ventilation and respiratory function stable Cardiovascular status: blood pressure returned to baseline and stable Postop Assessment: no apparent nausea or vomiting Anesthetic complications: no    Last Vitals:  Vitals:   03/29/17 1120 03/29/17 1135  BP: 138/65 (!) 146/68  Pulse: 81 75  Resp: 17 16  Temp: 36.5 C   SpO2: 94% 92%    Last Pain:  Vitals:   03/29/17 0814  TempSrc:   PainSc: 1                  Alejandra Hunt A.

## 2017-03-29 NOTE — Op Note (Addendum)
03/29/2017  11:04 AM  PATIENT:  Beth Holden    PRE-OPERATIVE DIAGNOSIS:  Osteomyelitis 3rd Metatarsal Head Right Foot  POST-OPERATIVE DIAGNOSIS:  Same  PROCEDURE:  RIGHT FOOT 3RD AND 4TH RAY AMPUTATION Local tissue rearrangement for wound closure 4 x 7 cm.  SURGEON:  Newt Minion, MD  PHYSICIAN ASSISTANT:None ANESTHESIA:   General  PREOPERATIVE INDICATIONS:  Beth Holden is a  72 y.o. female with a diagnosis of Osteomyelitis 3rd Metatarsal Head Right Foot who failed conservative measures and elected for surgical management.    The risks benefits and alternatives were discussed with the patient preoperatively including but not limited to the risks of infection, bleeding, nerve injury, cardiopulmonary complications, the need for revision surgery, among others, and the patient was willing to proceed.  OPERATIVE IMPLANTS: None.  @ENCIMAGES @  OPERATIVE FINDINGS: No abscess, good petechial bleeding.  OPERATIVE PROCEDURE: Patient was brought the operating room and underwent a general anesthetic.  After adequate levels of anesthesia were obtained patient's right lower extremity was prepped using DuraPrep draped in the sterile field a timeout was called.  A V incision was made around the ulcerative tissue on the plantar aspect of the foot beneath the third metatarsal head.  The ulcer the third metatarsal and fourth metatarsal were resected in one block of tissue.  Electrocautery was used for hemostasis patient had good petechial bleeding.  The wound was irrigated with normal saline.  Local tissue rearrangement was used to close a wound that was 4 x 7 cm.  This was closed with 2-0 nylon.  A sterile compressive dressing was applied patient was extubated taken the PACU in stable condition.   DISCHARGE PLANNING:  Antibiotic duration: Perioperatively.  Weightbearing: Nonweightbearing on the right.  Pain medication: Prescription for Vicodin.  Dressing care/ Wound VAC: Leave dressing  intact until follow-up in 1 week.  Ambulatory devices: Walker.  Discharge to: Home.  Follow-up: In the office 1 week post operative.

## 2017-03-29 NOTE — Anesthesia Procedure Notes (Signed)
Procedure Name: LMA Insertion Date/Time: 03/29/2017 10:40 AM Performed by: Lashawndra Lampkins T, CRNA Pre-anesthesia Checklist: Patient identified, Emergency Drugs available, Suction available and Patient being monitored Patient Re-evaluated:Patient Re-evaluated prior to induction Oxygen Delivery Method: Circle system utilized Preoxygenation: Pre-oxygenation with 100% oxygen Induction Type: IV induction Ventilation: Mask ventilation without difficulty LMA: LMA inserted LMA Size: 5.0 Number of attempts: 1 Airway Equipment and Method: Patient positioned with wedge pillow Placement Confirmation: positive ETCO2 and breath sounds checked- equal and bilateral Tube secured with: Tape Dental Injury: Teeth and Oropharynx as per pre-operative assessment

## 2017-03-30 ENCOUNTER — Encounter (HOSPITAL_COMMUNITY): Payer: Self-pay | Admitting: Orthopedic Surgery

## 2017-04-03 ENCOUNTER — Inpatient Hospital Stay: Payer: Medicare Other

## 2017-04-03 VITALS — BP 151/63 | HR 71 | Temp 97.7°F | Resp 17

## 2017-04-03 DIAGNOSIS — Z86010 Personal history of colonic polyps: Secondary | ICD-10-CM | POA: Diagnosis not present

## 2017-04-03 DIAGNOSIS — E11622 Type 2 diabetes mellitus with other skin ulcer: Secondary | ICD-10-CM | POA: Diagnosis not present

## 2017-04-03 DIAGNOSIS — K2970 Gastritis, unspecified, without bleeding: Secondary | ICD-10-CM | POA: Diagnosis not present

## 2017-04-03 DIAGNOSIS — D649 Anemia, unspecified: Secondary | ICD-10-CM

## 2017-04-03 DIAGNOSIS — E1140 Type 2 diabetes mellitus with diabetic neuropathy, unspecified: Secondary | ICD-10-CM | POA: Diagnosis not present

## 2017-04-03 DIAGNOSIS — I10 Essential (primary) hypertension: Secondary | ICD-10-CM | POA: Diagnosis not present

## 2017-04-03 DIAGNOSIS — D509 Iron deficiency anemia, unspecified: Secondary | ICD-10-CM | POA: Diagnosis not present

## 2017-04-03 MED ORDER — SODIUM CHLORIDE 0.9 % IV SOLN
Freq: Once | INTRAVENOUS | Status: AC
  Administered 2017-04-03: 08:00:00 via INTRAVENOUS

## 2017-04-03 MED ORDER — SODIUM CHLORIDE 0.9 % IV SOLN
125.0000 mg | Freq: Once | INTRAVENOUS | Status: AC
  Administered 2017-04-03: 125 mg via INTRAVENOUS
  Filled 2017-04-03: qty 10

## 2017-04-03 NOTE — Patient Instructions (Signed)
Sodium Ferric Gluconate Complex injection (Nulecit) What is this medicine? SODIUM FERRIC GLUCONATE COMPLEX (SOE dee um FER ik GLOO koe nate KOM pleks) is an iron replacement. It is used with epoetin therapy to treat low iron levels in patients who are receiving hemodialysis. This medicine may be used for other purposes; ask your health care provider or pharmacist if you have questions. COMMON BRAND NAME(S): Ferrlecit, Nulecit What should I tell my health care provider before I take this medicine? They need to know if you have any of the following conditions: -anemia that is not from iron deficiency -high levels of iron in the body -an unusual or allergic reaction to iron, benzyl alcohol, other medicines, foods, dyes, or preservatives -pregnant or are trying to become pregnant -breast-feeding How should I use this medicine? This medicine is for infusion into a vein. It is given by a health care professional in a hospital or clinic setting. Talk to your pediatrician regarding the use of this medicine in children. While this drug may be prescribed for children as young as 89 years old for selected conditions, precautions do apply. Overdosage: If you think you have taken too much of this medicine contact a poison control center or emergency room at once. NOTE: This medicine is only for you. Do not share this medicine with others. What if I miss a dose? It is important not to miss your dose. Call your doctor or health care professional if you are unable to keep an appointment. What may interact with this medicine? Do not take this medicine with any of the following medications: -deferoxamine -dimercaprol -other iron products This medicine may also interact with the following medications: -chloramphenicol -deferasirox -medicine for blood pressure like enalapril This list may not describe all possible interactions. Give your health care provider a list of all the medicines, herbs,  non-prescription drugs, or dietary supplements you use. Also tell them if you smoke, drink alcohol, or use illegal drugs. Some items may interact with your medicine. What should I watch for while using this medicine? Your condition will be monitored carefully while you are receiving this medicine. Visit your doctor for check-ups as directed. What side effects may I notice from receiving this medicine? Side effects that you should report to your doctor or health care professional as soon as possible: -allergic reactions like skin rash, itching or hives, swelling of the face, lips, or tongue -breathing problems -changes in hearing -changes in vision -chills, flushing, or sweating -fast, irregular heartbeat -feeling faint or lightheaded, falls -fever, flu-like symptoms -high or low blood pressure -pain, tingling, numbness in the hands or feet -severe pain in the chest, back, flanks, or groin -swelling of the ankles, feet, hands -trouble passing urine or change in the amount of urine -unusually weak or tired Side effects that usually do not require medical attention (report to your doctor or health care professional if they continue or are bothersome): -cramps -dark colored stools -diarrhea -headache -nausea, vomiting -stomach upset This list may not describe all possible side effects. Call your doctor for medical advice about side effects. You may report side effects to FDA at 1-800-FDA-1088. Where should I keep my medicine? This drug is given in a hospital or clinic and will not be stored at home. NOTE: This sheet is a summary. It may not cover all possible information. If you have questions about this medicine, talk to your doctor, pharmacist, or health care provider.  2018 Elsevier/Gold Standard (2007-08-29 15:58:57)

## 2017-04-05 ENCOUNTER — Ambulatory Visit (INDEPENDENT_AMBULATORY_CARE_PROVIDER_SITE_OTHER): Payer: Medicare Other

## 2017-04-05 ENCOUNTER — Encounter (INDEPENDENT_AMBULATORY_CARE_PROVIDER_SITE_OTHER): Payer: Self-pay

## 2017-04-05 VITALS — Ht 64.0 in | Wt 233.0 lb

## 2017-04-05 DIAGNOSIS — Z89421 Acquired absence of other right toe(s): Secondary | ICD-10-CM

## 2017-04-05 NOTE — Progress Notes (Signed)
Patient in office today for dressing change. She is one week s/p a right foot 3rd and 4th ray amputation on 03/29/17. Pt is weight bearing in a post op shoe with the surgical bandage intact. The incision is well approximated with stitches intact. There is no redness, swelling or signs of infection.  Applied 4x4, kerlix and ace bandage and a few additional supplies were given to the pt. Advised to keep the area clean and change dressing daily. Also advised to use her knee scooter that she has at home for ambulation to avoid putting pressure on the surgical site. Pt will call with any questions or concerns and will follow up with Dr. Sharol Given in the office next Thursday.    Beth Holden, [place], [employer]

## 2017-04-07 ENCOUNTER — Telehealth: Payer: Self-pay | Admitting: Internal Medicine

## 2017-04-07 NOTE — Telephone Encounter (Signed)
Patient ask you to call her concerning her medication prednisone,.

## 2017-04-10 ENCOUNTER — Telehealth (INDEPENDENT_AMBULATORY_CARE_PROVIDER_SITE_OTHER): Payer: Self-pay | Admitting: *Deleted

## 2017-04-10 NOTE — Telephone Encounter (Signed)
I called pt and asked her to come in tomorrow at 12:15. Can you open a space for her please?

## 2017-04-10 NOTE — Telephone Encounter (Signed)
Returned patient's call. She had questions about upcoming visit on 04/15. Patient was concern b/c her A1C was checked 12 days ago and it was 7.4 due to being on steroids and antibiotics. Did not want A1C rechecked and did not want to come to OV if it was just to check A1C. Explained reason for follow up. Patient decided to keep upcoming appt.

## 2017-04-10 NOTE — Telephone Encounter (Signed)
No we can cancel the Thursday appt thanks!

## 2017-04-10 NOTE — Telephone Encounter (Signed)
Pt called stating her daughter changed her bandage yesterday on her foot and saw a split on the bottom of foot and wants to know if she needs to have it looked at or if was normal. Says it was sore yesterday but not today. Would like a call back from you advising what she can do. Please call [phone]

## 2017-04-10 NOTE — Telephone Encounter (Signed)
Put patient on schedule for 04/11/17 @ 12:15 but she had appt for Thursday, should I leave appointment for Thursday 04/13/17 also or cancel

## 2017-04-11 ENCOUNTER — Ambulatory Visit (INDEPENDENT_AMBULATORY_CARE_PROVIDER_SITE_OTHER): Payer: Medicare Other | Admitting: Orthopedic Surgery

## 2017-04-11 ENCOUNTER — Encounter (INDEPENDENT_AMBULATORY_CARE_PROVIDER_SITE_OTHER): Payer: Self-pay | Admitting: Orthopedic Surgery

## 2017-04-11 DIAGNOSIS — M545 Low back pain, unspecified: Secondary | ICD-10-CM | POA: Insufficient documentation

## 2017-04-11 DIAGNOSIS — Z89421 Acquired absence of other right toe(s): Secondary | ICD-10-CM

## 2017-04-11 MED ORDER — PREDNISONE 10 MG PO TABS: 20.0000 mg | ORAL_TABLET | Freq: Every day | ORAL | 0 refills | Status: DC

## 2017-04-11 NOTE — Progress Notes (Signed)
Office Visit Note   [name]           Date of Birth: May 30, 1945           MRN: [id number] Visit Date: 04/11/2017              Requested by: Lucretia Kern, DO [address] [place], [place] [address] PCP: Lucretia Kern, DO  Chief Complaint  Patient presents with  . 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  Patient complains of soreness on the bottom of her foot.  Assessment & Plan: Visit Diagnoses: No diagnosis found.  Plan: Continue with a kneeling scooter continue with protected weightbearing follow-up in 1 week to harvest the sutures.  Follow-Up Instructions: No follow-ups on file.   Ortho Exam  Patient is alert, oriented, no adenopathy, well-dressed, normal affect, normal respiratory effort. Examination the incision is healing quite nicely the wound has gaped open on the plantar aspect just a little this is about a millimeter by 5 mm.  Patient complains of worsening back pain we will send in a prescription for low-dose prednisone.  Also recommended heat.  Imaging: No results found. No images are attached to the encounter.  Labs: Lab Results  Component Value Date   HGBA1C 7.4 (H) 03/29/2017   HGBA1C 7.3 12/26/2016   HGBA1C 7.0 09/19/2016   ESRSEDRATE 36 (H) 02/24/2017   CRP 1.2 (H) 02/24/2017   LABURIC 5.5 09/01/2015   LABORGA GROUP B STREP (S.AGALACTIAE) ISOLATED 12/26/2014    @LABSALLVALUES (HGBA1)@  There is no height or weight on file to calculate BMI.  Orders:  No orders of the defined types were placed in this encounter.  Meds ordered this encounter  Medications  . predniSONE (DELTASONE) 10 MG tablet    Sig: Take 2 tablets (20 mg total) by mouth daily with breakfast.    Dispense:  40 tablet    Refill:  0     Procedures: No procedures performed  Clinical Data: No additional  findings.  ROS:  All other systems negative, except as noted in the HPI. Review of Systems  Objective: Vital Signs: There were no vitals taken for this visit.  Specialty Comments:  No specialty comments available.  PMFS History: Patient Active Problem List   Diagnosis Date Noted  . Subacute osteomyelitis, right ankle and foot ([place])   . Anemia 02/24/2017  . Right foot ulcer, limited to breakdown of skin ([place]) 09/26/2016  . Osteomyelitis of toe of left foot ([place])   . Baker's cyst 07/10/2016  . Tachycardia 03/04/2016  . Onychomycosis 12/07/2015  . Non-pressure chronic ulcer of other part of right foot limited to breakdown of skin ([place]) 12/07/2015  . Upper airway cough syndrome 03/05/2014  . S/P amputation of lesser toe - followed by Dr. Sharol Given 04/15/2013  . Pain in joint, shoulder region, s/p RTC surgery - followed by Dr. Sharol Given 04/15/2013  . Essential hypertension 12/05/2006  . Uncontrolled type 2 diabetes mellitus with peripheral circulatory disorder ([place]) 10/12/2006  . Hyperlipidemia 10/12/2006  . Allergic rhinitis 10/12/2006  . GERD - Followed by Dr. Fuller Plan 10/12/2006  . IRRITABLE BOWEL SYNDROME - followed by Dr. Fuller Plan 10/12/2006  . Peripheral neuropathy 10/11/2006  . ALOPECIA NEC 10/11/2006   Past Medical History:  Diagnosis Date  . Alopecia   . Anemia, mild   . Arthritis   .  Chronic cough    sees pulmonologist  . Chronic pain    chest wall and abd - s/p extensive eval  . Diabetes mellitus with neuropathy ([place])    sees endocrine  . Diverticulosis   . Fatty liver   . GERD (gastroesophageal reflux disease)    takes Nexium bid, hx erosive esophagitis  . Headache(784.0)    occasionally;r/t sinus   . History of colon polyps   . HTN (hypertension)   . Hx of amputation of lesser toe ([place])    sees podiatrist  . Hyperlipemia   . IBS (irritable bowel syndrome)   . Insomnia    takes Elavil nightly  . Joint pain   . Neuropathy   . Osteomyelitis ([place])   . Pneumonia    . PONV (postoperative nausea and vomiting)   . Seasonal allergies    takes Zyrtec daily  . Sinus tachycardia     Family History  Problem Relation Age of Onset  . Lung cancer Mother 59       smoked heavily  . Emphysema Mother   . Hypertension Father   . Hyperlipidemia Father   . Diabetes Father   . Coronary artery disease Father   . Dementia Father   . COPD Father        smoked  . Stomach cancer Paternal Aunt   . Brain cancer Paternal Uncle   . Irritable bowel syndrome Unknown        Several family members on fathers side   . Diabetes Other   . Stomach cancer Maternal Aunt   . Lung cancer Paternal Uncle   . Heart disease Paternal Uncle   . Colon cancer Neg Hx     Past Surgical History:  Procedure Laterality Date  . AMPUTATION  12/28/2011   Procedure: AMPUTATION DIGIT;  Surgeon: Newt Minion, MD;  Location: [facility];  Service: Orthopedics;  Laterality: Right;  Right Foot 2nd Toe Amputation at MTP (metatarsophalangeal joint)  . AMPUTATION Right 04/25/2012   Procedure: Right Foot 3rd Toe Amputation;  Surgeon: Newt Minion, MD;  Location: [facility];  Service: Orthopedics;  Laterality: Right;  Right Foot Third Toe Amputation   . AMPUTATION Right 07/27/2012   Procedure: Right 4th Toe Amputation at Metatarsophalangeal;  Surgeon: Newt Minion, MD;  Location: [facility];  Service: Orthopedics;  Laterality: Right;  Right 4th Toe Amputation at Metatarsophalangeal  . AMPUTATION Left 07/15/2016   Procedure: Left 2nd Ray Amputation;  Surgeon: Newt Minion, MD;  Location: [facility];  Service: Orthopedics;  Laterality: Left;  . AMPUTATION Left 11/18/2016   Procedure: Left 3rd and 4th Ray Amputation;  Surgeon: Newt Minion, MD;  Location: [facility];  Service: Orthopedics;  Laterality: Left;  . AMPUTATION Right 03/29/2017   Procedure: RIGHT FOOT 3RD AND 4TH RAY AMPUTATION;  Surgeon: Newt Minion, MD;  Location: [facility];  Service: Orthopedics;  Laterality: Right;  . COLONOSCOPY    . LAPAROSCOPIC APPENDECTOMY   01/05/2011   Procedure: APPENDECTOMY LAPAROSCOPIC;  Surgeon: Pedro Earls, MD;  Location: [facility];  Service: General;  Laterality: N/A;  . OOPHORECTOMY  2001  . ROTATOR CUFF REPAIR Right    x 2  . TUBAL LIGATION    . VAGINAL HYSTERECTOMY  2001   Social History   Occupational History  . Occupation: Retired   Tobacco Use  . Smoking status: Never Smoker  . Smokeless tobacco: Never Used  Substance and Sexual Activity  . Alcohol use: No  .  Drug use: No  . Sexual activity: Yes    Birth control/protection: Surgical

## 2017-04-13 ENCOUNTER — Ambulatory Visit (INDEPENDENT_AMBULATORY_CARE_PROVIDER_SITE_OTHER): Payer: Medicare Other | Admitting: Orthopedic Surgery

## 2017-04-19 ENCOUNTER — Encounter (INDEPENDENT_AMBULATORY_CARE_PROVIDER_SITE_OTHER): Payer: Self-pay | Admitting: Orthopedic Surgery

## 2017-04-19 ENCOUNTER — Ambulatory Visit (INDEPENDENT_AMBULATORY_CARE_PROVIDER_SITE_OTHER): Payer: Medicare Other | Admitting: Orthopedic Surgery

## 2017-04-19 DIAGNOSIS — Z89421 Acquired absence of other right toe(s): Secondary | ICD-10-CM

## 2017-04-19 NOTE — Progress Notes (Signed)
Office Visit Note   [name]           Date of Birth: 08-Aug-1945           MRN: [id number] Visit Date: 04/19/2017              Requested by: Lucretia Kern, DO [address] [place], [place] [address] PCP: Lucretia Kern, DO  Chief Complaint  Patient presents with  . Right Foot - Routine Post Op    03/29/17 right foot 3rd and 4th ray amputation      HPI: Patient is a 72 year old woman who presents 3 weeks status post of ray amputations to the right foot she denies any problems denies any pain denies any drainage.  Assessment & Plan: Visit Diagnoses:  1. History of amputation of lesser toe of right foot ([place])     Plan: The sutures are harvested she will wear her medical knee-high compression stocking still minimize her activities until follow-up.  Anticipate full activities at follow-up in 2 weeks.  Follow-Up Instructions: Return in about 2 weeks (around 05/03/2017).   Ortho Exam  Patient is alert, oriented, no adenopathy, well-dressed, normal affect, normal respiratory effort. Examination the incision is healing nicely there is a very thin area of a blister that was removed there is good skin beneath the blistered area.  The wound edges are well approximated there is no redness no cellulitis no drainage no odor no signs of infection we will harvest the sutures.  Imaging: No results found. No images are attached to the encounter.  Labs: Lab Results  Component Value Date   HGBA1C 7.4 (H) 03/29/2017   HGBA1C 7.3 12/26/2016   HGBA1C 7.0 09/19/2016   ESRSEDRATE 36 (H) 02/24/2017   CRP 1.2 (H) 02/24/2017   LABURIC 5.5 09/01/2015   LABORGA GROUP B STREP (S.AGALACTIAE) ISOLATED 12/26/2014    @LABSALLVALUES (HGBA1)@  There is no height or weight on file to calculate BMI.  Orders:  No orders of the defined types were placed in this encounter.  No orders of the defined types were placed in this encounter.    Procedures: No procedures  performed  Clinical Data: No additional findings.  ROS:  All other systems negative, except as noted in the HPI. Review of Systems  Objective: Vital Signs: There were no vitals taken for this visit.  Specialty Comments:  No specialty comments available.  PMFS History: Patient Active Problem List   Diagnosis Date Noted  . Acute midline low back pain without sciatica 04/11/2017  . Subacute osteomyelitis, right ankle and foot ([place])   . Anemia 02/24/2017  . Right foot ulcer, limited to breakdown of skin ([place]) 09/26/2016  . Osteomyelitis of toe of left foot ([place])   . Baker's cyst 07/10/2016  . Tachycardia 03/04/2016  . Onychomycosis 12/07/2015  . Non-pressure chronic ulcer of other part of right foot limited to breakdown of skin ([place]) 12/07/2015  . Upper airway cough syndrome 03/05/2014  . History of amputation of lesser toe of right foot ([place]) 04/15/2013  . Pain in joint, shoulder region, s/p RTC surgery - followed by Dr. Sharol Given 04/15/2013  . Essential hypertension 12/05/2006  . Uncontrolled type 2 diabetes mellitus with peripheral circulatory disorder ([place]) 10/12/2006  . Hyperlipidemia 10/12/2006  . Allergic rhinitis 10/12/2006  . GERD - Followed by Dr. Fuller Plan 10/12/2006  . IRRITABLE BOWEL SYNDROME - followed by Dr. Fuller Plan 10/12/2006  . Peripheral neuropathy 10/11/2006  . ALOPECIA NEC 10/11/2006   Past Medical History:  Diagnosis Date  . Alopecia   . Anemia, mild   . Arthritis   . Chronic cough    sees pulmonologist  . Chronic pain    chest wall and abd - s/p extensive eval  . Diabetes mellitus with neuropathy ([place])    sees endocrine  . Diverticulosis   . Fatty liver   . GERD (gastroesophageal reflux disease)    takes Nexium bid, hx erosive esophagitis  . Headache(784.0)    occasionally;r/t sinus   . History of colon polyps   . HTN (hypertension)   . Hx of amputation of lesser toe ([place])    sees podiatrist  . Hyperlipemia   . IBS (irritable bowel syndrome)   .  Insomnia    takes Elavil nightly  . Joint pain   . Neuropathy   . Osteomyelitis ([place])   . Pneumonia   . PONV (postoperative nausea and vomiting)   . Seasonal allergies    takes Zyrtec daily  . Sinus tachycardia     Family History  Problem Relation Age of Onset  . Lung cancer Mother 12       smoked heavily  . Emphysema Mother   . Hypertension Father   . Hyperlipidemia Father   . Diabetes Father   . Coronary artery disease Father   . Dementia Father   . COPD Father        smoked  . Stomach cancer Paternal Aunt   . Brain cancer Paternal Uncle   . Irritable bowel syndrome Unknown        Several family members on fathers side   . Diabetes Other   . Stomach cancer Maternal Aunt   . Lung cancer Paternal Uncle   . Heart disease Paternal Uncle   . Colon cancer Neg Hx     Past Surgical History:  Procedure Laterality Date  . AMPUTATION  12/28/2011   Procedure: AMPUTATION DIGIT;  Surgeon: Newt Minion, MD;  Location: [facility];  Service: Orthopedics;  Laterality: Right;  Right Foot 2nd Toe Amputation at MTP (metatarsophalangeal joint)  . AMPUTATION Right 04/25/2012   Procedure: Right Foot 3rd Toe Amputation;  Surgeon: Newt Minion, MD;  Location: [facility];  Service: Orthopedics;  Laterality: Right;  Right Foot Third Toe Amputation   . AMPUTATION Right 07/27/2012   Procedure: Right 4th Toe Amputation at Metatarsophalangeal;  Surgeon: Newt Minion, MD;  Location: [facility];  Service: Orthopedics;  Laterality: Right;  Right 4th Toe Amputation at Metatarsophalangeal  . AMPUTATION Left 07/15/2016   Procedure: Left 2nd Ray Amputation;  Surgeon: Newt Minion, MD;  Location: [facility];  Service: Orthopedics;  Laterality: Left;  . AMPUTATION Left 11/18/2016   Procedure: Left 3rd and 4th Ray Amputation;  Surgeon: Newt Minion, MD;  Location: [facility];  Service: Orthopedics;  Laterality: Left;  . AMPUTATION Right 03/29/2017   Procedure: RIGHT FOOT 3RD AND 4TH RAY AMPUTATION;  Surgeon: Newt Minion, MD;   Location: [facility];  Service: Orthopedics;  Laterality: Right;  . COLONOSCOPY    . LAPAROSCOPIC APPENDECTOMY  01/05/2011   Procedure: APPENDECTOMY LAPAROSCOPIC;  Surgeon: Pedro Earls, MD;  [facility];  Service: General;  Laterality: N/A;  . OOPHORECTOMY  2001  . ROTATOR CUFF REPAIR Right    x 2  . TUBAL LIGATION    . VAGINAL HYSTERECTOMY  2001   Social History   Occupational History  . Occupation: Retired   Tobacco Use  . Smoking status: Never Smoker  .  Smokeless tobacco: Never Used  Substance and Sexual Activity  . Alcohol use: No  . Drug use: No  . Sexual activity: Yes    Birth control/protection: Surgical

## 2017-04-21 ENCOUNTER — Inpatient Hospital Stay: Payer: Medicare Other | Attending: Hematology and Oncology

## 2017-04-21 DIAGNOSIS — K589 Irritable bowel syndrome without diarrhea: Secondary | ICD-10-CM | POA: Insufficient documentation

## 2017-04-21 DIAGNOSIS — E1140 Type 2 diabetes mellitus with diabetic neuropathy, unspecified: Secondary | ICD-10-CM | POA: Insufficient documentation

## 2017-04-21 DIAGNOSIS — R05 Cough: Secondary | ICD-10-CM | POA: Diagnosis not present

## 2017-04-21 DIAGNOSIS — I10 Essential (primary) hypertension: Secondary | ICD-10-CM | POA: Diagnosis not present

## 2017-04-21 DIAGNOSIS — D509 Iron deficiency anemia, unspecified: Secondary | ICD-10-CM | POA: Diagnosis not present

## 2017-04-21 DIAGNOSIS — E11622 Type 2 diabetes mellitus with other skin ulcer: Secondary | ICD-10-CM | POA: Diagnosis not present

## 2017-04-21 LAB — CBC WITH DIFFERENTIAL (CANCER CENTER ONLY)
Basophils Absolute: 0.1 10*3/uL (ref 0.0–0.1)
Basophils Relative: 1 %
Eosinophils Absolute: 0.3 10*3/uL (ref 0.0–0.5)
Eosinophils Relative: 3 %
HCT: 38.4 % (ref 34.8–46.6)
Hemoglobin: 11.8 g/dL (ref 11.6–15.9)
Lymphocytes Relative: 23 %
Lymphs Abs: 2.4 10*3/uL (ref 0.9–3.3)
MCH: 25.3 pg (ref 25.1–34.0)
MCHC: 30.7 g/dL — ABNORMAL LOW (ref 31.5–36.0)
MCV: 82.4 fL (ref 79.5–101.0)
Monocytes Absolute: 0.5 10*3/uL (ref 0.1–0.9)
Monocytes Relative: 5 %
Neutro Abs: 7.1 10*3/uL — ABNORMAL HIGH (ref 1.5–6.5)
Neutrophils Relative %: 68 %
Platelet Count: 363 10*3/uL (ref 145–400)
RBC: 4.66 MIL/uL (ref 3.70–5.45)
RDW: 17.9 % — ABNORMAL HIGH (ref 11.2–14.5)
WBC Count: 10.4 10*3/uL — ABNORMAL HIGH (ref 3.9–10.3)

## 2017-04-21 LAB — CMP (CANCER CENTER ONLY)
ALT: 17 U/L (ref 0–55)
AST: 18 U/L (ref 5–34)
Albumin: 3.6 g/dL (ref 3.5–5.0)
Alkaline Phosphatase: 54 U/L (ref 40–150)
Anion gap: 10 (ref 3–11)
BUN: 19 mg/dL (ref 7–26)
CO2: 30 mmol/L — ABNORMAL HIGH (ref 22–29)
Calcium: 9.9 mg/dL (ref 8.4–10.4)
Chloride: 103 mmol/L (ref 98–109)
Creatinine: 0.89 mg/dL (ref 0.60–1.10)
GFR, Est AFR Am: >60 mL/min (ref >60)
GFR, Estimated: >60 mL/min (ref >60)
Glucose, Bld: 130 mg/dL (ref 70–140)
Potassium: 4.6 mmol/L (ref 3.5–5.1)
Sodium: 143 mmol/L (ref 136–145)
Total Bilirubin: 0.3 mg/dL (ref 0.2–1.2)
Total Protein: 7.4 g/dL (ref 6.4–8.3)

## 2017-04-21 LAB — IRON AND TIBC
Iron: 57 ug/dL (ref 41–142)
Saturation Ratios: 18 % — ABNORMAL LOW (ref 21–57)
TIBC: 311 ug/dL (ref 236–444)
UIBC: 254 ug/dL

## 2017-04-21 LAB — FERRITIN: Ferritin: 134 ng/mL (ref 9–269)

## 2017-04-24 ENCOUNTER — Telehealth: Payer: Self-pay | Admitting: Hematology and Oncology

## 2017-04-24 ENCOUNTER — Inpatient Hospital Stay: Payer: Medicare Other

## 2017-04-24 ENCOUNTER — Encounter: Payer: Self-pay | Admitting: Internal Medicine

## 2017-04-24 ENCOUNTER — Ambulatory Visit (INDEPENDENT_AMBULATORY_CARE_PROVIDER_SITE_OTHER): Payer: Medicare Other | Admitting: Internal Medicine

## 2017-04-24 ENCOUNTER — Inpatient Hospital Stay (HOSPITAL_BASED_OUTPATIENT_CLINIC_OR_DEPARTMENT_OTHER): Payer: Medicare Other | Admitting: Hematology and Oncology

## 2017-04-24 VITALS — BP 149/82 | HR 85 | Temp 98.0°F | Resp 18 | Ht 64.0 in | Wt 235.4 lb

## 2017-04-24 VITALS — BP 142/72 | HR 79 | Ht 64.0 in | Wt 236.4 lb

## 2017-04-24 DIAGNOSIS — E785 Hyperlipidemia, unspecified: Secondary | ICD-10-CM | POA: Diagnosis not present

## 2017-04-24 DIAGNOSIS — IMO0002 Reserved for concepts with insufficient information to code with codable children: Secondary | ICD-10-CM

## 2017-04-24 DIAGNOSIS — E11622 Type 2 diabetes mellitus with other skin ulcer: Secondary | ICD-10-CM | POA: Diagnosis not present

## 2017-04-24 DIAGNOSIS — G63 Polyneuropathy in diseases classified elsewhere: Secondary | ICD-10-CM

## 2017-04-24 DIAGNOSIS — D509 Iron deficiency anemia, unspecified: Secondary | ICD-10-CM

## 2017-04-24 DIAGNOSIS — E1165 Type 2 diabetes mellitus with hyperglycemia: Secondary | ICD-10-CM | POA: Diagnosis not present

## 2017-04-24 DIAGNOSIS — I10 Essential (primary) hypertension: Secondary | ICD-10-CM

## 2017-04-24 DIAGNOSIS — K589 Irritable bowel syndrome without diarrhea: Secondary | ICD-10-CM

## 2017-04-24 DIAGNOSIS — E1151 Type 2 diabetes mellitus with diabetic peripheral angiopathy without gangrene: Secondary | ICD-10-CM

## 2017-04-24 DIAGNOSIS — R05 Cough: Secondary | ICD-10-CM | POA: Diagnosis not present

## 2017-04-24 DIAGNOSIS — E1140 Type 2 diabetes mellitus with diabetic neuropathy, unspecified: Secondary | ICD-10-CM

## 2017-04-24 NOTE — Patient Instructions (Addendum)
Please continue:  Insulin Before breakfast Before lunch Before dinner At bedtime  Regular  20 units with a small meal  25 units with a larger meal  5 units with a smaller meal  10 units with a larger meal  20 units with a smaller meal  25 units with a larger meal   NPH 25 x x 25   - Sliding scale of R insulin: 150-200: + 2 unit 201-250: + 3 units 251-300: + 4 units >300: + 5 units  - Metformin 1000 mg 2x a day  Please start Trulicity 3.15 mg weekly. Let me know when you are close to running out to call in the higher dose to your pharmacy (1.5 mg).  ____________________________________________________  If you cannot get the Trulicity, then change the regimen:  Insulin Before breakfast Before lunch Before dinner At bedtime  Regular  20 units with a small meal  25 units with a larger meal  5 units with a smaller meal  10 units with a larger meal  20 units with a smaller meal  25 units with a larger meal   NPH 30 x x 30   - Sliding scale of R insulin: 150-200: + 2 unit 201-250: + 3 units 251-300: + 4 units >300: + 5 units  - Metformin 1000 mg 2x a day  Please consider the gastric sleeve surgery.  Please return in 3 months with your sugar log.    Sleeve Gastrectomy A sleeve gastrectomy is a surgery in which a large portion of the stomach is removed. After the surgery, the stomach is a narrow tube about the size of a banana. The reduced size of the stomach restricts the amount of food that you can eat, which helps you to lose weight. Tell a health care provider about:  Any allergies you have.  All medicines you are taking, including vitamins, herbs, eye drops, creams, and over-the-counter medicines.  Any problems you or family members have had with anesthetic medicines.  Any blood disorders you have.  Any surgeries you have had.  Any medical conditions you have.  Whether you are pregnant or may be pregnant, if this applies. What are the  risks? Generally, this is a safe procedure. However, problems may occur, including:  Infection.  Bleeding.  Allergic reactions to medicines.  Damage to other structures or organs.  Blood clots.  Leakage of fluid from the stomach into the abdominal cavity. (This is rare.)  What happens before the procedure?  Ask your health care provider about: ? Changing or stopping your regular medicines. This is especially important if you are taking diabetes medicines or blood thinners. ? Taking medicines such as aspirin and ibuprofen. These medicines can thin your blood. Do not take these medicines before your procedure if your health care provider instructs you not to.  You may have tests, including: ? Blood tests. ? Urine tests. ? Stool tests. ? X-rays. ? Ultrasound. ? Esophageal manometry. This checks the tube that carries food and liquids from your mouth to your stomach (esophagus).  Follow instructions from your health care provider about eating or drinking restrictions.  Plan to have someone take you home after the procedure.  If you will be going home right after the procedure, plan to have someone with you for 24 hours.  Ask your health care provider how your surgical site will be marked or identified.  You may be given antibiotic medicine to help prevent infection. What happens during the procedure?  To reduce  your risk of infection: ? Your health care team will wash or sanitize their hands. ? Your skin will be washed with soap.  An IV tube will be inserted into one of your veins.  You will be given a medicine to make you fall asleep (general anesthetic). You may also be given a medicine to help you relax (sedative).  Several small incisions will be made in your abdomen.  A thin, lighted tube with a tiny camera on the end (laparoscope) will be inserted into one of the incisions. The camera sends a picture to a TV screen in the operating room. This gives the surgeon a  good view of the stomach.  Small surgical instruments will be put through the other incisions.  Part of your stomach will be cut and removed through one of the incisions.  The remaining part of your stomach will be closed using stitches (sutures).  A small tube (drain) may be placed through one of the incisions to allow extra fluid to flow from the area.  Your incisions may be closed with sutures, staples, or skin glue.  Your incisions may be covered with a bandage (dressing). The procedure may vary among health care providers and hospitals. What happens after the procedure?  You will continue to receive fluids and medicines through an IV tube.  You may have some pain and nausea. Medicines will be available to help you.  You may have fluid leaking from a drain in one of your incisions.  You may have to wear compression stockings. These stockings help to prevent blood clots and reduce swelling in your legs.  You will be encouraged to walk around several times a day. This helps to prevent blood clots.  You will be started on a liquid diet the first day after your procedure. You may have some tests to check if you are ready to start the liquid diet.  You will be encouraged to cough and to perform deep-breathing exercises. This helps to prevent a lung infection.  Do not drive for 24 hours if you received a sedative. This information is not intended to replace advice given to you by your health care provider. Make sure you discuss any questions you have with your health care provider. Document Released: 10/24/2008 Document Revised: 09/21/2015 Document Reviewed: 06/20/2014 Elsevier Interactive Patient Education  [employer].

## 2017-04-24 NOTE — Progress Notes (Addendum)
Subjective:     Patient ID: Beth Holden, female   DOB: Dec 23, 1945, 72 y.o.   MRN: [id number]  HPI Beth Holden is a pleasant 72 y.o. woman returning for f/u for DM2, dx ~2000, uncontrolled, insulin-dependent, with complications (diabetic peripheral neuropathy, multiple toe amputations). Last visit 4 months ago.  She had a URI in 02/2017 >> was on ABx, Prednisone.  Sugars higher, up to 400s.  Last HbA1C was: Lab Results  Component Value Date   HGBA1C 7.4 (H) 03/29/2017   HGBA1C 7.3 12/26/2016   HGBA1C 7.0 09/19/2016   She is now on: - Metformin  1000 mg 2x a day  Insulin Before breakfast Before lunch Before dinner At bedtime  Regular  20 units with a small meal  25 units with a larger meal  5 units with a smaller meal  10 units with a larger meal  20 units with a smaller meal  25 units with a larger meal   NPH 25 x x 25   - Sliding scale of R insulin: 150-200: + 2 unit 201-250: + 3 units 251-300: + 4 units >300: + 5 units She also tried: - Levemir 47 units in am and 37 units in pm >> $800 for 3 mo supply - Januvia 100 mg daily - $450 for 3 mo - Invokana 100 mg - $455 for 3 mo - Took Byetta before We discussed about starting her on a VGo mechanical pump in the past. She had an appointment with diabetes education but decided not to pursue it.  She checks her sugars 3 times a day - very variable: - am: 113-198, 238 >> 78, 101-163, 195 >> 111-221 - 2h after b'fast : 139-182 >> 130, 131-242 >> 184, 190, 307 - prelunch: 110-214 >> n/c  - 2-3h after lunch: 52, 124-202, 265 >> n/c >> 212 >> 63, 203-332 - before dinner:56, 110, 141, 177 >> 173 >> 153, 468 - after dinner:104-226 >> 183, 263 >> 203 >> 151-204, 451 - bedtime:  124, 135, 300 >> 138-238, 334 >> 154, 156, 326 - nighttime: 47, 54-217 >> 69, 75-177 >> 60, 97-229, 251 Lowest: 47 >> 69 >> .  Has hypoglycemia awareness in the 70. Highest: 400s >> 334 >> 468 - Prednisone  Meals: - Breakfast: egg, bacon, toast;  grits; biscuit; fruit; sometimes skips - Lunch: 1/2 PB sandwich or soup and yoghurt, sometimes crackers - Dinner: meat + vegetables + some starch - Snacks: fruit   -No CKD. BUN  Date Value Ref Range Status  04/21/2017 19 7 - 26 mg/dL Final   Creatinine  Date Value Ref Range Status  04/21/2017 0.89 0.60 - 1.10 mg/dL Final  On enalapril.  -+ HL; latest lipid panel: Lab Results  Component Value Date   CHOL 166 11/30/2015   HDL 57.10 11/30/2015   LDLCALC 80 11/30/2015   TRIG 145.0 11/30/2015   CHOLHDL 3 11/30/2015  On Mevacor. - last eye exam in spring 2018: + Mild DR, no glaucoma (Beth Holden). - + numbness and tingling in feet -off amitriptyline but continues on gabapentin.  Review of Systems Constitutional: no weight gain/no weight loss, no fatigue, no subjective hyperthermia, no subjective hypothermia, + nocturia Eyes: +blurry vision, no xerophthalmia ENT: no sore throat, no nodules palpated in throat, no dysphagia, no odynophagia, no hoarseness Cardiovascular: no CP/no SOB/no palpitations/+ leg swelling Respiratory: no cough/no SOB/no wheezing Gastrointestinal: no N/no V/no D/+ C/no acid reflux Musculoskeletal: no muscle aches/no joint aches Skin: no rashes, no hair loss Neurological:  no tremors/+ numbness/+ tingling/no dizziness  I reviewed pt's medications, allergies, PMH, social hx, family hx, and changes were documented in the history of present illness. Otherwise, unchanged from my initial visit note.  Objective:   Physical Exam BP (!) 142/72   Pulse 79   Ht 5\' 4"  (1.626 m)   Wt 236 lb 6.4 oz (107.2 kg)   SpO2 96%   BMI 40.58 kg/m  Body mass index is 40.58 kg/m. Wt Readings from Last 3 Encounters:  04/24/17 236 lb 6.4 oz (107.2 kg)  04/24/17 235 lb 6.4 oz (106.8 kg)  04/05/17 233 lb (105.7 kg)   Constitutional: overweight, in NAD Eyes: PERRLA, EOMI, no exophthalmos ENT: moist mucous membranes, no thyromegaly, no cervical lymphadenopathy Cardiovascular:  RRR, No MRG Respiratory: CTA B Gastrointestinal: abdomen soft, NT, ND, BS+ Musculoskeletal: no deformities, strength intact in all 4 Skin: moist, warm, no rashes Neurological: no tremor with outstretched hands, DTR normal in all 4   Assessment:     1. 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 HL  4.  Obesity class 3  Plan:     1. DM2 - pt with long-standing, uncontrolled, type 2 diabetes, on the basal-bolus insulin regimen.  Her sugars are still fluctuating, and this pattern is more consistent with insulin deficiency (LADA pattern).  At last visit, she had less lows but more highs compared to previous visits.  This was likely due to the holidays and also previous surgery.  Since I absolutely wanted her to avoid low blood sugars, we continued the same regimen then.   - Reviewed together her most recent HbA1c which was slightly higher, at 7.4% - Sugars are very variable and we discussed about potentially adding Trulicity.  We discussed about benefits and possible side effects.  We will start at a low dose and increase as tolerated.  I advised her to stay with a lower dose of regular insulin if she cannot Trulicity.  I also gave her a backup plan if she cannot get Trulicity-increase her NPH doses in that case. - I advised her to:  Patient Instructions   Please continue:  Insulin Before breakfast Before lunch Before dinner At bedtime  Regular  20 units with a small meal  25 units with a larger meal  5 units with a smaller meal  10 units with a larger meal  20 units with a smaller meal  25 units with a larger meal   NPH 25 x x 25   - Sliding scale of R  insulin: 150-200: + 2 unit 201-250: + 3 units 251-300: + 4 units >300: + 5 units  - Metformin 1000 mg 2x a day  Please start Trulicity 2.68 mg weekly. Let me know when you are close to running out to call in the higher dose to your pharmacy (1.5 mg).  ____________________________________________________  If you cannot get the Trulicity, then change the regimen:  Insulin Before breakfast Before lunch Before dinner At bedtime  Regular  20 units with a small meal  25 units with a larger meal  5 units with a smaller meal  10 units with a larger meal  20  units with a smaller meal  25 units with a larger meal   NPH 30 x x 30   - Sliding scale of R insulin: 150-200: + 2 unit 201-250: + 3 units 251-300: + 4 units >300: + 5 units  - Metformin 1000 mg 2x a day  Please consider the gastric sleeve surgery.  Please return in 3 months with your sugar log.    - continue checking sugars at different times of the day - check 3x a day, rotating checks - advised for yearly eye exams >> she is UTD - Return to clinic in 3 mo with sugar log     2. PN -Due to diabetes -She came off amitriptyline due to brain fog -Now continues on Neurontin  3. HL - Reviewed latest lipid panel from 2017: All fractions at goal - Continues the statin (Mevacor) without side effects. -She is due for another lipid panel >> will check this at next visit  4.  Obesity class III -She inquires about gastric bypass and I encouraged her to consider this.  We discussed about the gastric sleeve and I gave her a handout about it.  Discussed about seeing [facility] for an initial appointment to discuss about the surgery.  We also discussed about the improvement of her diabetes and the possibility of decreasing the insulin doses after the surgery.  Addendum: Patient contacted me after the appointment that she can afford the Trulicity.  Will send 0.75 mg weekly.  Philemon Kingdom, MD PhD [facility]  Endocrinology

## 2017-04-24 NOTE — Telephone Encounter (Signed)
Scheduled appt per 4/15 los - pt did not want avs or calendar with appts.

## 2017-04-25 ENCOUNTER — Telehealth: Payer: Self-pay | Admitting: Internal Medicine

## 2017-04-25 MED ORDER — DULAGLUTIDE 0.75 MG/0.5ML ~~LOC~~ SOAJ: SUBCUTANEOUS | 1 refills | Status: DC

## 2017-04-25 NOTE — Telephone Encounter (Signed)
Patient stated that Dr wanted her to start  trulicity And for patient to check with pharmacy before doctor will send in Patient contacted the pharmacy can stated it was okay to go ahead and send this.     [id number] - [place], [place] - [address]

## 2017-04-25 NOTE — Addendum Note (Signed)
Addended by: Philemon Kingdom on: 04/25/2017 02:10 PM   Modules accepted: Orders

## 2017-04-25 NOTE — Telephone Encounter (Signed)
Great! I sent it.

## 2017-04-26 NOTE — Telephone Encounter (Signed)
Ok thanks. Informed patient.

## 2017-05-01 ENCOUNTER — Ambulatory Visit (INDEPENDENT_AMBULATORY_CARE_PROVIDER_SITE_OTHER): Payer: Medicare Other | Admitting: Orthopedic Surgery

## 2017-05-01 ENCOUNTER — Encounter (INDEPENDENT_AMBULATORY_CARE_PROVIDER_SITE_OTHER): Payer: Self-pay | Admitting: Orthopedic Surgery

## 2017-05-01 DIAGNOSIS — Z89421 Acquired absence of other right toe(s): Secondary | ICD-10-CM

## 2017-05-01 NOTE — Progress Notes (Signed)
Office Visit Note   [name]           Date of Birth: [birth year]           MRN: [id number] Visit Date: 05/01/2017              Requested by: Lucretia Kern, DO [address] [place], [place] [address] PCP: Lucretia Kern, DO  No chief complaint on file.     HPI: Patient is a 72 year old woman who presents 4 weeks status post fourth and third ray amputations right foot.  Patient complains of onychomycotic nails x4.  Assessment & Plan: Visit Diagnoses:  1. History of amputation of lesser toe of right foot ([place])     Plan: Patient's incision has healed nicely there is no redness no cellulitis we will allow her to advance her activities as tolerated recommended increasing her walking as she feels comfortable.  Follow-Up Instructions: Return in about 3 months (around 07/31/2017).   Ortho Exam  Patient is alert, oriented, no adenopathy, well-dressed, normal affect, normal respiratory effort. Examination there is no redness no cellulitis no signs of infection her incision has healed well.  She does have onychomycotic nails of the great toe and little toe on both feet and these nails were trimmed x4 without complications.  Patient is currently in her extra-depth shoes with custom orthotics.  Imaging: No results found. No images are attached to the encounter.  Labs: Lab Results  Component Value Date   HGBA1C 7.4 (H) 03/29/2017   HGBA1C 7.3 12/26/2016   HGBA1C 7.0 09/19/2016   ESRSEDRATE 36 (H) 02/24/2017   CRP 1.2 (H) 02/24/2017   LABURIC 5.5 09/01/2015   LABORGA GROUP B STREP (S.AGALACTIAE) ISOLATED 12/26/2014    @LABSALLVALUES (HGBA1)@  There is no height or weight on file to calculate BMI.  Orders:  No orders of the defined types were placed in this encounter.  No orders of the defined types were placed in this encounter.    Procedures: No procedures performed  Clinical Data: No additional findings.  ROS:  All other systems negative,  except as noted in the HPI. Review of Systems  Objective: Vital Signs: There were no vitals taken for this visit.  Specialty Comments:  No specialty comments available.  PMFS History: Patient Active Problem List   Diagnosis Date Noted  . Obesity, Class III, BMI 40-49.9 (morbid obesity) ([place]) 04/24/2017  . Acute midline low back pain without sciatica 04/11/2017  . Subacute osteomyelitis, right ankle and foot ([place])   . Anemia 02/24/2017  . Right foot ulcer, limited to breakdown of skin ([place]) 09/26/2016  . Osteomyelitis of toe of left foot ([place])   . Baker's cyst 07/10/2016  . Tachycardia 03/04/2016  . Onychomycosis 12/07/2015  . Non-pressure chronic ulcer of other part of right foot limited to breakdown of skin ([place]) 12/07/2015  . Upper airway cough syndrome 03/05/2014  . History of amputation of lesser toe of right foot ([place]) 04/15/2013  . Pain in joint, shoulder region, s/p RTC surgery - followed by Dr. Sharol Given 04/15/2013  . Essential hypertension 12/05/2006  . Uncontrolled type 2 diabetes mellitus with peripheral circulatory disorder ([place]) 10/12/2006  . Hyperlipidemia 10/12/2006  . Allergic rhinitis 10/12/2006  . GERD - Followed by Dr. Fuller Plan 10/12/2006  . IRRITABLE BOWEL SYNDROME - followed by Dr. Fuller Plan 10/12/2006  . Peripheral neuropathy 10/11/2006  . ALOPECIA NEC 10/11/2006   Past Medical History:  Diagnosis Date  . Alopecia   . Anemia, mild   .  Arthritis   . Chronic cough    sees pulmonologist  . Chronic pain    chest wall and abd - s/p extensive eval  . Diabetes mellitus with neuropathy ([place])    sees endocrine  . Diverticulosis   . Fatty liver   . GERD (gastroesophageal reflux disease)    takes Nexium bid, hx erosive esophagitis  . Headache(784.0)    occasionally;r/t sinus   . History of colon polyps   . HTN (hypertension)   . Hx of amputation of lesser toe ([place])    sees podiatrist  . Hyperlipemia   . IBS (irritable bowel syndrome)   . Insomnia    takes  Elavil nightly  . Joint pain   . Neuropathy   . Osteomyelitis ([place])   . Pneumonia   . PONV (postoperative nausea and vomiting)   . Seasonal allergies    takes Zyrtec daily  . Sinus tachycardia     Family History  Problem Relation Age of Onset  . Lung cancer Mother 51       smoked heavily  . Emphysema Mother   . Hypertension Father   . Hyperlipidemia Father   . Diabetes Father   . Coronary artery disease Father   . Dementia Father   . COPD Father        smoked  . Stomach cancer Paternal Aunt   . Brain cancer Paternal Uncle   . Irritable bowel syndrome Unknown        Several family members on fathers side   . Diabetes Other   . Stomach cancer Maternal Aunt   . Lung cancer Paternal Uncle   . Heart disease Paternal Uncle   . Colon cancer Neg Hx     Past Surgical History:  Procedure Laterality Date  . AMPUTATION  12/28/2011   Procedure: AMPUTATION DIGIT;  Surgeon: Newt Minion, MD;  Location: [facility];  Service: Orthopedics;  Laterality: Right;  Right Foot 2nd Toe Amputation at MTP (metatarsophalangeal joint)  . AMPUTATION Right 04/25/2012   Procedure: Right Foot 3rd Toe Amputation;  Surgeon: Newt Minion, MD;  Location: [facility];  Service: Orthopedics;  Laterality: Right;  Right Foot Third Toe Amputation   . AMPUTATION Right 07/27/2012   Procedure: Right 4th Toe Amputation at Metatarsophalangeal;  Surgeon: Newt Minion, MD;  Location: [facility];  Service: Orthopedics;  Laterality: Right;  Right 4th Toe Amputation at Metatarsophalangeal  . AMPUTATION Left 07/15/2016   Procedure: Left 2nd Ray Amputation;  Surgeon: Newt Minion, MD;  Location: [facility];  Service: Orthopedics;  Laterality: Left;  . AMPUTATION Left 11/18/2016   Procedure: Left 3rd and 4th Ray Amputation;  Surgeon: Newt Minion, MD;  Location: [facility];  Service: Orthopedics;  Laterality: Left;  . AMPUTATION Right 03/29/2017   Procedure: RIGHT FOOT 3RD AND 4TH RAY AMPUTATION;  Surgeon: Newt Minion, MD;  Location: [facility];   Service: Orthopedics;  Laterality: Right;  . COLONOSCOPY    . LAPAROSCOPIC APPENDECTOMY  01/05/2011   Procedure: APPENDECTOMY LAPAROSCOPIC;  Surgeon: Pedro Earls, MD;  Location: [facility];  Service: General;  Laterality: N/A;  . OOPHORECTOMY  2001  . ROTATOR CUFF REPAIR Right    x 2  . TUBAL LIGATION    . VAGINAL HYSTERECTOMY  2001   Social History   Occupational History  . Occupation: Retired   Tobacco Use  . Smoking status: Never Smoker  . Smokeless tobacco: Never Used  Substance and Sexual Activity  . Alcohol  use: No  . Drug use: No  . Sexual activity: Yes    Birth control/protection: Surgical

## 2017-05-11 DIAGNOSIS — D509 Iron deficiency anemia, unspecified: Secondary | ICD-10-CM | POA: Insufficient documentation

## 2017-05-11 NOTE — Progress Notes (Signed)
[facility] Follow-up Visit:  Assessment: Anemia 72 y.o. female with microcytic hypochromic anemia in the setting of iron deficiency detected in January 2019.  Recent endoscopic evaluations show no frank gastrointestinal bleeding, but significant gastritis is noted that is a potential cause of intermittent GI blood loss.  In addition, patient has likely a chronic inflammatory state due to persistent diabetic ulcers which impair anemia recovery by creating deterioration consistent with anemia of chronic disease.  Time of the clinic visit, report from the gastric biopsy was pending.  Repeat blood work obtained during our last clinic visit confirmed presence of significant iron deficiency especially in the context of elevated inflammatory markers with ferritin 24 being likely artificially elevated by the inflammation reaction.  In the interim, hemoglobin has improved a little bit, but remains suppressed with significant microcytosis and hypochromia.  Interestingly, upper endoscopy demonstrates presence of significant gastritis likely attributable to side effects of the oral iron intake.  It was treated with 4 doses of intravenous iron preparation Ferrlecit with excellent improvement in the ferritin level and continued improvement in hemoglobin.   Plan: -No need for additional iron today.   -Return to clinic in 1 month: Labs 2-3 days prior, clinic visit, possible additional parenteral iron administration.   Voice recognition software was used and creation of this note. Despite my best effort at editing the text, some misspelling/errors may have occurred.  Orders Placed This Encounter  Procedures  . CBC with Differential (Cancer Center Only)    Standing Status:   Future    Standing Expiration Date:   05/12/2018  . CMP ([place] only)    Standing Status:   Future    Standing Expiration Date:   05/12/2018  . Iron and TIBC    Standing Status:   Future    Standing Expiration  Date:   05/12/2018  . Ferritin    Standing Status:   Future    Standing Expiration Date:   05/12/2018    Cancer Staging No matching staging information was found for the patient.  All questions were answered.  . The patient knows to call the clinic with any problems, questions or concerns.  This note was electronically signed.    History of Presenting Illness Beth Holden is a 72 y.o. female followed in the [facility] for evaluation of microcytic anemia, referred by Dr Lucretia Kern.  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  At the present time, patient is taking ferrous sulfate twice daily which was started in January 2019.  Patient returns to the clinic for continued hematological monitoring.  She underwent amputation of metatarsal heads 3 and 4 on 03/29/2017, but otherwise is doing well.  Healing well after the surgery, no interval wound bleeding, melena, or hematochezia.  Oncological/hematological History: **Microcytic Anemia: --Labs, 11/18/16: WBC 10.4, Hgb 9.6, MCV 77.0, MCH 22.7, MCHC 29.5, RDW 15.9, Plt 432; --Labs, 12/23/16: WBC 13.8, Hgb 9.8, MCV 72.7, MCH 22.8, MCHC 31.4, RDW 15.1, Plt 467;  --Labs, 01/13/17:           Fe 19, FeSat     4%, TIBC    ..., Ferritin 15, Transferrin 321 --Colonoscopy/EGD (Dr Cecil Cranker), 02/15/17: Diffuse edema, erythema, and friability with granularity of the  gastric body and posterior wall of the antrum mucosal surfaces consistent with gastritis.  Normal appearance of duodenum and esophagus.  Colonoscopy demonstrated a 0.6 cm transverse colon polyp which has been removed.  Pathology -- GASTRIC ANTRAL MUCOSA  WITH NON-SPECIFIC REACTIVE GASTROPATHY AND FEW SUBEPITHELIAL CRYSTALLINE IRON DEPOSITS, CONSISTENT WITH IRON PILL GASTROPATHY. GASTRIC OXYNTIC MUCOSA WITH PARIETAL CELL HYPERPLASIA AS CAN BE SEEN IN HYPERGASTRINEMIC STATES SUCH AS PPI THERAPY. WARTHIN-STARRY STAIN IS NEGATIVE FOR HELICOBACTER PYLORI. DUODENAL MUCOSA WITH NO SPECIFIC HISTOPATHOLOGIC CHANGES. NEGATIVE FOR INCREASED INTRAEPITHELIAL LYMPHOCYTES OR VILLOUS ARCHITECTURAL CHANGES. TUBULAR ADENOMA. NEGATIVE FOR HIGH GRADE DYSPLASIA OR MALIGNANCY. --Labs, 02/24/17: Hgb 10.8, MCV 74.7, MCH 23.0, MCHC 30.8, RDW 17.6, Plt 413;  Fe 24, FeSat     6%, TIBC 384, Ferritin 24; CRP 1.2, ESR 36 --Treatment:   --Ferrlecit 160m IV, 03/13/17   --Ferrlecit 1259mIV, 03/20/17:    --Ferrlecit 12596mV, 03/27/17:   --Ferrlecit 125m33m, 04/03/17: --Labs, 04/21/17: Hgb 11.8, MCV 82.4, MCH 25.3, MCHC 30.7, RDW 17.9, Plt 363;  Fer 57, FeSat 18%, TIBC 311, Ferritin 134;   No history exists.    Medical History: Past Medical History:  Diagnosis Date  . Alopecia   . Anemia, mild   . Arthritis   . Chronic cough    sees pulmonologist  . Chronic pain    chest wall and abd - s/p extensive eval  . Diabetes mellitus with neuropathy (HCC)[place] sees endocrine  . Diverticulosis   . Fatty liver   . GERD (gastroesophageal reflux disease)    takes Nexium bid, hx erosive esophagitis  . Headache(784.0)    occasionally;r/t sinus   . History of colon polyps   . HTN (hypertension)   . Hx of amputation of lesser toe (HCC)[place] sees podiatrist  . Hyperlipemia   . IBS (irritable bowel syndrome)   . Insomnia    takes Elavil nightly  . Joint pain   . Neuropathy   . Osteomyelitis (HCC)[place]. Pneumonia   . PONV (postoperative nausea and vomiting)   . Seasonal allergies    takes Zyrtec daily  . Sinus tachycardia     Surgical History: Past Surgical History:  Procedure Laterality Date  . AMPUTATION  12/28/2011   Procedure: AMPUTATION DIGIT;  Surgeon: MarcNewt Minion;  [facility] [place];  Laterality: Right;  Right Foot 2nd Toe Amputation at MTP (metatarsophalangeal joint)  . AMPUTATION Right 04/25/2012   Procedure: Right Foot 3rd Toe Amputation;  Surgeon: MarcNewt Minion;  [facility];  Laterality: Right;  Right Foot Third Toe Amputation   . AMPUTATION Right 07/27/2012   Procedure: Right 4th Toe Amputation at Metatarsophalangeal;  Surgeon: MarcNewt Minion;  [facility] [place];  Laterality: Right;  Right 4th Toe Amputation at Metatarsophalangeal  . AMPUTATION Left 07/15/2016   Procedure: Left 2nd Ray Amputation;  Surgeon: DudaNewt Minion;  [facility] [place];  Laterality: Left;  . AMPUTATION Left 11/18/2016   Procedure: Left 3rd and 4th Ray Amputation;  Surgeon: DudaNewt Minion;  [facility] [place] [place];  Laterality: Left;  . AMPUTATION Right 03/29/2017   Procedure: RIGHT FOOT 3RD AND 4TH RAY AMPUTATION;  Surgeon: DudaNewt Minion;  [place] [place] [place];  Laterality: Right;  . COLONOSCOPY    . LAPAROSCOPIC APPENDECTOMY  01/05/2011   Procedure: APPENDECTOMY LAPAROSCOPIC;  Surgeon: MattRodman Key  Verdie Drown, MD;  Location: [facility];  Service: General;  Laterality: N/A;  . OOPHORECTOMY  2001  . ROTATOR CUFF REPAIR Right    x 2  . TUBAL LIGATION    . VAGINAL HYSTERECTOMY  2001    Family History: Family History  Problem Relation Age of Onset  . Lung cancer Mother 55       smoked heavily  . Emphysema Mother   . Hypertension Father   . Hyperlipidemia Father   . Diabetes Father   . Coronary artery disease Father   . Dementia Father   . COPD Father        smoked  . Stomach cancer Paternal Aunt   . Brain cancer Paternal Uncle   . Irritable bowel syndrome Unknown        Several family members on fathers side   . Diabetes Other   . Stomach cancer Maternal Aunt   . Lung cancer Paternal Uncle   . Heart disease Paternal Uncle   .  Colon cancer Neg Hx     Social History: Social History   Socioeconomic History  . Marital status: Married    Spouse name: Not on file  . Number of children: 2  . Years of education: Not on file  . Highest education level: Not on file  Occupational History  . Occupation: Retired   Scientific laboratory technician  . Financial resource strain: Not on file  . Food insecurity:    Worry: Not on file    Inability: Not on file  . Transportation needs:    Medical: Not on file    Non-medical: Not on file  Tobacco Use  . Smoking status: Never Smoker  . Smokeless tobacco: Never Used  Substance and Sexual Activity  . Alcohol use: No  . Drug use: No  . Sexual activity: Yes    Birth control/protection: Surgical  Lifestyle  . Physical activity:    Days per week: Not on file    Minutes per session: Not on file  . Stress: Not on file  Relationships  . Social connections:    Talks on phone: Not on file    Gets together: Not on file    Attends religious service: Not on file    Active member of club or organization: Not on file    Attends meetings of clubs or organizations: Not on file    Relationship status: Not on file  . Intimate partner violence:    Fear of current or ex partner: Not on file    Emotionally abused: Not on file    Physically abused: Not on file    Forced sexual activity: Not on file  Other Topics Concern  . Not on file  Social History Narrative   Caffeine daily    [employer], [employer] no diploma   Married '66   1 dtr- '78; 1 son '71; 2 grandchildren   Occupation: retired 04   Dad with alzheimers-had to place in [place] (summer '10)          Allergies: Allergies  Allergen Reactions  . Codeine Other (See Comments)    Makes her crazy  . Propoxyphene Hcl Itching    *DARVOCET     Medications:  Current Outpatient Medications  Medication Sig Dispense Refill  . acetaminophen (TYLENOL) 500 MG tablet Take 500 mg by mouth every 8 (eight) hours as needed for mild pain.     Marland Kitchen betamethasone  dipropionate (DIPROLENE) 0.05 % cream Apply 1 application topically 2 (two)  times daily as needed (IRRITATION).    Marland Kitchen cetirizine (ZYRTEC) 10 MG tablet Take 1 tablet (10 mg total) by mouth daily. 90 tablet 1  . cholecalciferol (VITAMIN D) 1000 units tablet Take 1 tablet by mouth daily.     . Dulaglutide (TRULICITY) 4.25 ZD/6.3OV SOPN Inject under skin 0.75 mg weekly in a.m. 4 pen 1  . Fluocinolone Acetonide (DERMOTIC) 0.01 % OIL Place 1 drop in ear(s) daily as needed (itching).     . gabapentin (NEURONTIN) 100 MG capsule TAKE 2 CAPSULES BY MOUTH AT BEDTIME (Patient taking differently: Take 200-300 mg by mouth at bedtime. TAKE 2-3 CAPSULES BY MOUTH AT BEDTIME) 180 capsule 1  . insulin NPH Human (NOVOLIN N RELION) 100 UNIT/ML injection Inject under skin 25 units in am and 25 units at bedtime (Patient taking differently: Inject 25 Units into the skin 2 (two) times daily before a meal. Inject under skin 25 units in am and 25 units at bedtime) 20 mL 3  . Insulin Regular Human (NOVOLIN R IJ) Inject 5-25 Units as directed See admin instructions. Pt states she takes 20-25 units at breakfast, 5-10 units at lunch, 20-25 units at dinner and takes a bedtime dose also.    . losartan (COZAAR) 25 MG tablet Take 1 tablet (25 mg total) by mouth daily. 90 tablet 1  . lovastatin (MEVACOR) 20 MG tablet Take 1 tablet (20 mg total) by mouth at bedtime. 90 tablet 1  . metFORMIN (GLUCOPHAGE) 1000 MG tablet TAKE 1 TABLET TWICE A DAY WITH A MEAL 180 tablet 1  . metoprolol succinate (TOPROL-XL) 25 MG 24 hr tablet TAKE 1 TABLET BY MOUTH EVERY DAY 90 tablet 1  . naproxen sodium (ANAPROX) 220 MG tablet Take 220 mg by mouth daily as needed (pain).    Marland Kitchen omeprazole (PRILOSEC) 40 MG capsule Take 80 mg by mouth 2 (two) times daily.    Marland Kitchen OVER THE COUNTER MEDICATION Apply 1-3 application topically at bedtime. TOPRICIN FOOT CREAM - NEUROPATHY FOOT CREAM **APPLIES TO BOTH FEET AT BEDTIME**    . Probiotic Product (PROBIOTIC DAILY PO) Take 1  capsule by mouth daily.    . psyllium (METAMUCIL) 58.6 % packet Take 1 packet by mouth daily.    . silver sulfADIAZINE (SILVADENE) 1 % cream Apply 1 application daily topically. Apply to foot    . vitamin B-12 (CYANOCOBALAMIN) 1000 MCG tablet Take 1,000 mcg daily by mouth.     Current Facility-Administered Medications  Medication Dose Route Frequency Provider Last Rate Last Dose  . 0.9 %  sodium chloride infusion  500 mL Intravenous Once Ladene Artist, MD        Review of Systems: Review of Systems  Constitutional: Positive for fatigue.  Respiratory: Positive for cough.   Gastrointestinal: Positive for constipation and diarrhea.  All other systems reviewed and are negative.    PHYSICAL EXAMINATION Blood pressure (!) 149/82, pulse 85, temperature 98 F (36.7 C), temperature source Oral, resp. rate 18, height '5\' 4"'  (1.626 m), weight 235 lb 6.4 oz (106.8 kg), SpO2 96 %.  ECOG PERFORMANCE STATUS: 1 - Symptomatic but completely ambulatory  Physical Exam  Constitutional: She is oriented to person, place, and time and well-developed, well-nourished, and in no distress. No distress.  HENT:  Head: Normocephalic and atraumatic.  Mouth/Throat: Oropharynx is clear and moist. No oropharyngeal exudate.  Eyes: Pupils are equal, round, and reactive to light. Conjunctivae and EOM are normal. No scleral icterus.  Neck: No thyromegaly present.  Cardiovascular: Normal rate,  regular rhythm and normal heart sounds.  No murmur heard. Pulmonary/Chest: Effort normal and breath sounds normal. No respiratory distress. She has no wheezes. She has no rales.  Abdominal: Soft. Bowel sounds are normal. She exhibits no distension and no mass. There is no tenderness. There is no guarding.  Musculoskeletal: She exhibits no edema.  Lymphadenopathy:    She has no cervical adenopathy.  Neurological: She is alert and oriented to person, place, and time. She has normal reflexes. No cranial nerve deficit.  Skin:  Skin is warm and dry. No rash noted. She is not diaphoretic. No erythema. No pallor.     LABORATORY DATA: I have personally reviewed the data as listed: Appointment on 04/21/2017  Component Date Value Ref Range Status  . Ferritin 04/21/2017 134  9 - 269 ng/mL Final   Performed at [facility], [place] [address] [place], [place] [address]  . Iron 04/21/2017 57  41 - 142 ug/dL Final  . TIBC 04/21/2017 311  236 - 444 ug/dL Final  . Saturation Ratios 04/21/2017 18* 21 - 57 % Final  . UIBC 04/21/2017 254  ug/dL Final   Performed at [facility], [place] [address], [place], [place] [address]  . Sodium 04/21/2017 143  136 - 145 mmol/L Final  . Potassium 04/21/2017 4.6  3.5 - 5.1 mmol/L Final  . Chloride 04/21/2017 103  98 - 109 mmol/L Final  . CO2 04/21/2017 30* 22 - 29 mmol/L Final  . Glucose, Bld 04/21/2017 130  70 - 140 mg/dL Final  . BUN 04/21/2017 19  7 - 26 mg/dL Final  . Creatinine 04/21/2017 0.89  0.60 - 1.10 mg/dL Final  . Calcium 04/21/2017 9.9  8.4 - 10.4 mg/dL Final  . Total Protein 04/21/2017 7.4  6.4 - 8.3 g/dL Final  . Albumin 04/21/2017 3.6  3.5 - 5.0 g/dL Final  . AST 04/21/2017 18  5 - 34 U/L Final  . ALT 04/21/2017 17  0 - 55 U/L Final  . Alkaline Phosphatase 04/21/2017 54  40 - 150 U/L Final  . Total Bilirubin 04/21/2017 0.3  0.2 - 1.2 mg/dL Final  . GFR, Est Non Af Am 04/21/2017 >60  >60 mL/min Final  . GFR, Est AFR Am 04/21/2017 >60  >60 mL/min Final   Comment: (NOTE) The eGFR has been calculated using the CKD EPI equation. This calculation has not been validated in all clinical situations. eGFR's persistently <60 mL/min signify possible Chronic Kidney Disease.   Georgiann Hahn gap 04/21/2017 10  3 - 11 Final   Performed at [facility], [place] [address] [place], [place] [address]  . WBC Count 04/21/2017 10.4* 3.9 - 10.3 K/uL Final  . RBC 04/21/2017 4.66  3.70 - 5.45 MIL/uL Final  . Hemoglobin  04/21/2017 11.8  11.6 - 15.9 g/dL Final  . HCT 04/21/2017 38.4  34.8 - 46.6 % Final  . MCV 04/21/2017 82.4  79.5 - 101.0 fL Final  . MCH 04/21/2017 25.3  25.1 - 34.0 pg Final  . MCHC 04/21/2017 30.7* 31.5 - 36.0 g/dL Final  . RDW 04/21/2017 17.9* 11.2 - 14.5 % Final  . Platelet Count 04/21/2017 363  145 - 400 K/uL Final  . Neutrophils Relative % 04/21/2017 68  % Final  . Neutro Abs 04/21/2017 7.1* 1.5 - 6.5 K/uL Final  . Lymphocytes Relative 04/21/2017 23  % Final  . Lymphs Abs 04/21/2017 2.4  0.9 - 3.3 K/uL Final  . Monocytes  Relative 04/21/2017 5  % Final  . Monocytes Absolute 04/21/2017 0.5  0.1 - 0.9 K/uL Final  . Eosinophils Relative 04/21/2017 3  % Final  . Eosinophils Absolute 04/21/2017 0.3  0.0 - 0.5 K/uL Final  . Basophils Relative 04/21/2017 1  % Final  . Basophils Absolute 04/21/2017 0.1  0.0 - 0.1 K/uL Final   Performed at [facility], [place] [address] [place], [place] [address]       Ardath Sax, MD

## 2017-05-11 NOTE — Assessment & Plan Note (Signed)
72 y.o. female with microcytic hypochromic anemia in the setting of iron deficiency detected in January 2019.  Recent endoscopic evaluations show no frank gastrointestinal bleeding, but significant gastritis is noted that is a potential cause of intermittent GI blood loss.  In addition, patient has likely a chronic inflammatory state due to persistent diabetic ulcers which impair anemia recovery by creating deterioration consistent with anemia of chronic disease.  Time of the clinic visit, report from the gastric biopsy was pending.  Repeat blood work obtained during our last clinic visit confirmed presence of significant iron deficiency especially in the context of elevated inflammatory markers with ferritin 24 being likely artificially elevated by the inflammation reaction.  In the interim, hemoglobin has improved a little bit, but remains suppressed with significant microcytosis and hypochromia.  Interestingly, upper endoscopy demonstrates presence of significant gastritis likely attributable to side effects of the oral iron intake.  It was treated with 4 doses of intravenous iron preparation Ferrlecit with excellent improvement in the ferritin level and continued improvement in hemoglobin.   Plan: -No need for additional iron today.   -Return to clinic in 1 month: Labs 2-3 days prior, clinic visit, possible additional parenteral iron administration.

## 2017-05-19 ENCOUNTER — Other Ambulatory Visit: Payer: Self-pay | Admitting: *Deleted

## 2017-05-19 ENCOUNTER — Inpatient Hospital Stay: Payer: Medicare Other | Attending: Hematology and Oncology

## 2017-05-19 DIAGNOSIS — D509 Iron deficiency anemia, unspecified: Secondary | ICD-10-CM | POA: Diagnosis not present

## 2017-05-19 LAB — CMP (CANCER CENTER ONLY)
ALT: 20 U/L (ref 0–55)
AST: 20 U/L (ref 5–34)
Albumin: 3.8 g/dL (ref 3.5–5.0)
Alkaline Phosphatase: 65 U/L (ref 40–150)
Anion gap: 9 (ref 3–11)
BUN: 14 mg/dL (ref 7–26)
CO2: 28 mmol/L (ref 22–29)
Calcium: 9.8 mg/dL (ref 8.4–10.4)
Chloride: 101 mmol/L (ref 98–109)
Creatinine: 0.88 mg/dL (ref 0.60–1.10)
GFR, Est AFR Am: >60 mL/min (ref >60)
GFR, Estimated: >60 mL/min (ref >60)
Glucose, Bld: 132 mg/dL (ref 70–140)
Potassium: 3.9 mmol/L (ref 3.5–5.1)
Sodium: 138 mmol/L (ref 136–145)
Total Bilirubin: 0.3 mg/dL (ref 0.2–1.2)
Total Protein: 7.6 g/dL (ref 6.4–8.3)

## 2017-05-19 LAB — CBC WITH DIFFERENTIAL (CANCER CENTER ONLY)
Basophils Absolute: 0.1 10*3/uL (ref 0.0–0.1)
Basophils Relative: 0 %
Eosinophils Absolute: 0.3 10*3/uL (ref 0.0–0.5)
Eosinophils Relative: 2 %
HCT: 38.3 % (ref 34.8–46.6)
Hemoglobin: 12.1 g/dL (ref 11.6–15.9)
Lymphocytes Relative: 28 %
Lymphs Abs: 3.4 10*3/uL — ABNORMAL HIGH (ref 0.9–3.3)
MCH: 26.1 pg (ref 25.1–34.0)
MCHC: 31.6 g/dL (ref 31.5–36.0)
MCV: 82.7 fL (ref 79.5–101.0)
Monocytes Absolute: 0.7 10*3/uL (ref 0.1–0.9)
Monocytes Relative: 6 %
Neutro Abs: 7.5 10*3/uL — ABNORMAL HIGH (ref 1.5–6.5)
Neutrophils Relative %: 64 %
Platelet Count: 408 10*3/uL — ABNORMAL HIGH (ref 145–400)
RBC: 4.63 MIL/uL (ref 3.70–5.45)
RDW: 16.1 % — ABNORMAL HIGH (ref 11.2–14.5)
WBC Count: 11.9 10*3/uL — ABNORMAL HIGH (ref 3.9–10.3)

## 2017-05-19 MED ORDER — DULAGLUTIDE 0.75 MG/0.5ML ~~LOC~~ SOAJ: SUBCUTANEOUS | 0 refills | Status: DC

## 2017-05-21 ENCOUNTER — Other Ambulatory Visit: Payer: Self-pay | Admitting: Internal Medicine

## 2017-05-22 ENCOUNTER — Encounter: Payer: Self-pay | Admitting: Hematology and Oncology

## 2017-05-22 ENCOUNTER — Inpatient Hospital Stay: Payer: Medicare Other

## 2017-05-22 ENCOUNTER — Telehealth: Payer: Self-pay | Admitting: Hematology and Oncology

## 2017-05-22 ENCOUNTER — Inpatient Hospital Stay (HOSPITAL_BASED_OUTPATIENT_CLINIC_OR_DEPARTMENT_OTHER): Payer: Medicare Other | Admitting: Hematology and Oncology

## 2017-05-22 VITALS — BP 153/61 | HR 89 | Temp 97.5°F | Resp 18 | Ht 64.0 in | Wt 230.2 lb

## 2017-05-22 VITALS — BP 145/67 | HR 70 | Temp 98.4°F | Resp 18

## 2017-05-22 DIAGNOSIS — D649 Anemia, unspecified: Secondary | ICD-10-CM

## 2017-05-22 DIAGNOSIS — D509 Iron deficiency anemia, unspecified: Secondary | ICD-10-CM | POA: Diagnosis not present

## 2017-05-22 LAB — IRON AND TIBC
Iron: 32 ug/dL — ABNORMAL LOW (ref 41–142)
Saturation Ratios: 10 % — ABNORMAL LOW (ref 21–57)
TIBC: 311 ug/dL (ref 236–444)
UIBC: 279 ug/dL

## 2017-05-22 LAB — FERRITIN: Ferritin: 85 ng/mL (ref 9–269)

## 2017-05-22 MED ORDER — SODIUM CHLORIDE 0.9 % IV SOLN
Freq: Once | INTRAVENOUS | Status: AC
  Administered 2017-05-22: 16:00:00 via INTRAVENOUS

## 2017-05-22 MED ORDER — DIPHENHYDRAMINE HCL 25 MG PO CAPS
25.0000 mg | ORAL_CAPSULE | Freq: Once | ORAL | Status: AC
  Administered 2017-05-22: 25 mg via ORAL

## 2017-05-22 MED ORDER — SODIUM CHLORIDE 0.9 % IV SOLN
125.0000 mg | Freq: Once | INTRAVENOUS | Status: AC
  Administered 2017-05-22: 125 mg via INTRAVENOUS
  Filled 2017-05-22: qty 10

## 2017-05-22 MED ORDER — DIPHENHYDRAMINE HCL 25 MG PO CAPS
ORAL_CAPSULE | ORAL | Status: AC
  Filled 2017-05-22: qty 1

## 2017-05-22 NOTE — Patient Instructions (Signed)
Sodium Ferric Gluconate Complex injection (Nulecit) What is this medicine? SODIUM FERRIC GLUCONATE COMPLEX (SOE dee um FER ik GLOO koe nate KOM pleks) is an iron replacement. It is used with epoetin therapy to treat low iron levels in patients who are receiving hemodialysis. This medicine may be used for other purposes; ask your health care provider or pharmacist if you have questions. COMMON BRAND NAME(S): Ferrlecit, Nulecit What should I tell my health care provider before I take this medicine? They need to know if you have any of the following conditions: -anemia that is not from iron deficiency -high levels of iron in the body -an unusual or allergic reaction to iron, benzyl alcohol, other medicines, foods, dyes, or preservatives -pregnant or are trying to become pregnant -breast-feeding How should I use this medicine? This medicine is for infusion into a vein. It is given by a health care professional in a hospital or clinic setting. Talk to your pediatrician regarding the use of this medicine in children. While this drug may be prescribed for children as young as 43 years old for selected conditions, precautions do apply. Overdosage: If you think you have taken too much of this medicine contact a poison control center or emergency room at once. NOTE: This medicine is only for you. Do not share this medicine with others. What if I miss a dose? It is important not to miss your dose. Call your doctor or health care professional if you are unable to keep an appointment. What may interact with this medicine? Do not take this medicine with any of the following medications: -deferoxamine -dimercaprol -other iron products This medicine may also interact with the following medications: -chloramphenicol -deferasirox -medicine for blood pressure like enalapril This list may not describe all possible interactions. Give your health care provider a list of all the medicines, herbs,  non-prescription drugs, or dietary supplements you use. Also tell them if you smoke, drink alcohol, or use illegal drugs. Some items may interact with your medicine. What should I watch for while using this medicine? Your condition will be monitored carefully while you are receiving this medicine. Visit your doctor for check-ups as directed. What side effects may I notice from receiving this medicine? Side effects that you should report to your doctor or health care professional as soon as possible: -allergic reactions like skin rash, itching or hives, swelling of the face, lips, or tongue -breathing problems -changes in hearing -changes in vision -chills, flushing, or sweating -fast, irregular heartbeat -feeling faint or lightheaded, falls -fever, flu-like symptoms -high or low blood pressure -pain, tingling, numbness in the hands or feet -severe pain in the chest, back, flanks, or groin -swelling of the ankles, feet, hands -trouble passing urine or change in the amount of urine -unusually weak or tired Side effects that usually do not require medical attention (report to your doctor or health care professional if they continue or are bothersome): -cramps -dark colored stools -diarrhea -headache -nausea, vomiting -stomach upset This list may not describe all possible side effects. Call your doctor for medical advice about side effects. You may report side effects to FDA at 1-800-FDA-1088. Where should I keep my medicine? This drug is given in a hospital or clinic and will not be stored at home. NOTE: This sheet is a summary. It may not cover all possible information. If you have questions about this medicine, talk to your doctor, pharmacist, or health care provider.  2018 Elsevier/Gold Standard (2007-08-29 15:58:57)

## 2017-05-22 NOTE — Telephone Encounter (Signed)
Appointments scheduled AVS/Calendar printed per 5/13 los °

## 2017-05-22 NOTE — Progress Notes (Signed)
Upon completion of 49minute observation pt reports facial itching and "cloudy vision". On call Dr. Ree Kida. Pt reports that cloudy vision is "not uncommon for me". Per Dr. Burr Medico, 25mg  oral benadryl administered and pt discharged to home with husband. Pt instructed to alert 911 if any symptoms worsened. Pt voiced understanding.

## 2017-05-31 ENCOUNTER — Encounter: Payer: Self-pay | Admitting: Internal Medicine

## 2017-06-07 NOTE — Assessment & Plan Note (Signed)
72 y.o. female with microcytic hypochromic anemia in the setting of iron deficiency detected in Jan 2019.  Recent endoscopic evaluations showed no frank gastrointestinal bleeding, but significant gastritis was noted that was a potential cause of intermittent GI blood loss.  In addition, patient was deemed to likely have a chronic inflammatory state due to persistent diabetic ulcers which would impair anemia recovery by creating decreased sensitivity to endogenous erythropoietin effect consistent with anemia of chronic disease. Blood work obtained in our clinic visit confirmed presence of significant iron deficiency especially in the context of elevated inflammatory markers with ferritin 24 being likely artificially elevated by the inflammation reaction.  Interestingly, upper endoscopy demonstrated presence of significant gastritis likely attributable to side effects of the oral iron intake.  Due to findings on endoscopy, patient was taken off of the oral iron supplementation.  She was subsequently treated with 4 doses of intravenous iron preparation Ferrlecit with excellent improvement in the ferritin level and continued improvement in hemoglobin.  Since the last visit to the clinic, hemoglobin continues to rise although there has been a significant drop in the ferritin level   Plan: -Proceed with additional dose of Ferrlecit today, goal to maintain ferritin over 100 -Return to clinic in 1 month: Labs 2-3 days prior, clinic visit, possible additional parenteral iron administration.

## 2017-06-07 NOTE — Progress Notes (Signed)
[facility] Follow-up Visit:  Assessment: Iron deficiency anemia 72 y.o. female with microcytic hypochromic anemia in the setting of iron deficiency detected in Jan 2019.  Recent endoscopic evaluations showed no frank gastrointestinal bleeding, but significant gastritis was noted that was a potential cause of intermittent GI blood loss.  In addition, patient was deemed to likely have a chronic inflammatory state due to persistent diabetic ulcers which would impair anemia recovery by creating decreased sensitivity to endogenous erythropoietin effect consistent with anemia of chronic disease. Blood work obtained in our clinic visit confirmed presence of significant iron deficiency especially in the context of elevated inflammatory markers with ferritin 24 being likely artificially elevated by the inflammation reaction.  Interestingly, upper endoscopy demonstrated presence of significant gastritis likely attributable to side effects of the oral iron intake.  Due to findings on endoscopy, patient was taken off of the oral iron supplementation.  She was subsequently treated with 4 doses of intravenous iron preparation Ferrlecit with excellent improvement in the ferritin level and continued improvement in hemoglobin.  Since the last visit to the clinic, hemoglobin continues to rise although there has been a significant drop in the ferritin level   Plan: -Proceed with additional dose of Ferrlecit today, goal to maintain ferritin over 100 -Return to clinic in 1 month: Labs 2-3 days prior, clinic visit, possible additional parenteral iron administration.   Voice recognition software was used and creation of this note. Despite my best effort at editing the text, some misspelling/errors may have occurred.  No orders of the defined types were placed in this encounter.   Cancer Staging No matching staging information was found for the patient.  All questions were answered.  . The  patient knows to call the clinic with any problems, questions or concerns.  This note was electronically signed.    History of Presenting Illness Beth Holden is a 72 y.o. female followed in the [facility] for evaluation of microcytic anemia, referred by Dr Lucretia Kern.  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  At the present time, patient is taking ferrous sulfate twice daily which was started in January 2019.  Patient returns to the clinic for continued hematological monitoring.  No new events since last visit to the clinic.  Oncological/hematological History: **Microcytic Anemia: --Labs, 11/18/16: WBC 10.4, Hgb 9.6, MCV 77.0, MCH 22.7, MCHC 29.5, RDW 15.9, Plt 432; --Labs, 12/23/16: WBC 13.8, Hgb 9.8, MCV 72.7, MCH 22.8, MCHC 31.4, RDW 15.1, Plt 467;  --Labs, 01/13/17:           Fe 19, FeSat     4%, TIBC    ..., Ferritin 15, Transferrin 321 --Colonoscopy/EGD (Dr Cecil Cranker), 02/15/17: Diffuse edema, erythema, and friability with granularity of the gastric body and posterior wall of the antrum mucosal surfaces consistent with gastritis.  Normal appearance of duodenum and esophagus.  Colonoscopy demonstrated a 0.6 cm transverse colon polyp which has been removed.  Pathology -- GASTRIC ANTRAL MUCOSA WITH NON-SPECIFIC REACTIVE GASTROPATHY AND FEW SUBEPITHELIAL CRYSTALLINE IRON DEPOSITS, CONSISTENT WITH IRON PILL GASTROPATHY. GASTRIC OXYNTIC MUCOSA WITH PARIETAL CELL HYPERPLASIA AS CAN BE SEEN IN HYPERGASTRINEMIC STATES SUCH AS PPI THERAPY. WARTHIN-STARRY STAIN IS NEGATIVE FOR HELICOBACTER PYLORI. DUODENAL MUCOSA WITH NO SPECIFIC HISTOPATHOLOGIC  CHANGES.  NEGATIVE FOR INCREASED INTRAEPITHELIAL LYMPHOCYTES OR VILLOUS ARCHITECTURAL CHANGES. TUBULAR ADENOMA. NEGATIVE FOR HIGH GRADE DYSPLASIA OR MALIGNANCY. --Labs, 02/24/17: Hgb 10.8, MCV 74.7, MCH 23.0, MCHC 30.8, RDW 17.6, Plt 413; Fe 24, FeSat     6%, TIBC 384, Ferritin 24; CRP 1.2, ESR 36 --Treatment:   --Ferrlecit 150m IV, 03/13/17   --Ferrlecit 122mIV, 03/20/17:    --Ferrlecit 125106mV, 03/27/17:   --Ferrlecit 125m41m, 04/03/17: --Labs, 04/21/17: Hgb 11.8, MCV 82.4, MCH 25.3, MCHC 30.7, RDW 17.9, Plt 363; Fe 57, FeSat 18%, TIBC 311, Ferritin 134; --Labs, 05/19/17: Hgb 12.1, MCV 82.7, MCH 26.1, MCHC 31.6, RDW 16.1, Plt 408; Fe 32, FeSat 10%, TIBC 311, Ferritin   85;   --Ferrlecit 125mg18m 05/22/17:   No history exists.    Medical History: Past Medical History:  Diagnosis Date  . Alopecia   . Anemia, mild   . Arthritis   . Chronic cough    sees pulmonologist  . Chronic pain    chest wall and abd - s/p extensive eval  . Diabetes mellitus with neuropathy (HCC) [place]  . Diverticulosis   . Fatty liver   . GERD (gastroesophageal reflux disease)    takes Nexium bid, hx erosive esophagitis  . Headache(784.0)    occasionally;r/t sinus   . History of colon polyps   . HTN (hypertension)   . Hx of amputation of lesser toe (HCC) [place]  . Hyperlipemia   . IBS (irritable bowel syndrome)   . Insomnia    takes Elavil nightly  . Joint pain   . Neuropathy   . Osteomyelitis (HCC) [place] Pneumonia   . PONV (postoperative nausea and vomiting)   . Seasonal allergies    takes Zyrtec daily  . Sinus tachycardia     Surgical History: Past Surgical History:  Procedure Laterality Date  . AMPUTATION  12/28/2011   Procedure: AMPUTATION DIGIT;  Surgeon: MarcuNewt Minion  [facility];  Laterality: Right;  Right Foot 2nd Toe Amputation at MTP (metatarsophalangeal joint)  . AMPUTATION Right 04/25/2012   Procedure: Right Foot 3rd Toe  Amputation;  Surgeon: MarcuNewt Minion  [facility];  Laterality: Right;  Right Foot Third Toe Amputation   . AMPUTATION Right 07/27/2012   Procedure: Right 4th Toe Amputation at Metatarsophalangeal;  Surgeon: MarcuNewt Minion  [facility];  Laterality: Right;  Right 4th Toe Amputation at Metatarsophalangeal  . AMPUTATION Left 07/15/2016   Procedure: Left 2nd Ray Amputation;  Surgeon: Duda,Newt Minion  [facility]ics;  Laterality: Left;  . AMPUTATION Left 11/18/2016   Procedure: Left 3rd and 4th Ray Amputation;  Surgeon: Duda,Newt Minion  [facility];  Laterality: Left;  . AMPUTATION Right 03/29/2017   Procedure: RIGHT FOOT 3RD AND 4TH RAY AMPUTATION;  Surgeon: Duda,Newt Minion  [facility];  Laterality: Right;  . COLONOSCOPY    . LAPAROSCOPIC APPENDECTOMY  01/05/2011   Procedure: APPENDECTOMY LAPAROSCOPIC;  Surgeon: MatthPedro Earls  [facility];  Service: General;  Laterality: N/A;  . OOPHORECTOMY  2001  . ROTATOR CUFF REPAIR Right    x 2  . TUBAL LIGATION    . VAGINAL HYSTERECTOMY  2001    Family History: Family History  Problem Relation Age of Onset  . Lung  cancer Mother 5       smoked heavily  . Emphysema Mother   . Hypertension Father   . Hyperlipidemia Father   . Diabetes Father   . Coronary artery disease Father   . Dementia Father   . COPD Father        smoked  . Stomach cancer Paternal Aunt   . Brain cancer Paternal Uncle   . Irritable bowel syndrome Unknown        Several family members on fathers side   . Diabetes Other   . Stomach cancer Maternal Aunt   . Lung cancer Paternal Uncle   . Heart disease Paternal Uncle   . Colon cancer Neg Hx     Social History: Social History   Socioeconomic History  . Marital status: Married    Spouse name: Not on file  . Number of children: 2  . Years of education: Not on  file  . Highest education level: Not on file  Occupational History  . Occupation: Retired   Scientific laboratory technician  . Financial resource strain: Not on file  . Food insecurity:    Worry: Not on file    Inability: Not on file  . Transportation needs:    Medical: Not on file    Non-medical: Not on file  Tobacco Use  . Smoking status: Never Smoker  . Smokeless tobacco: Never Used  Substance and Sexual Activity  . Alcohol use: No  . Drug use: No  . Sexual activity: Yes    Birth control/protection: Surgical  Lifestyle  . Physical activity:    Days per week: Not on file    Minutes per session: Not on file  . Stress: Not on file  Relationships  . Social connections:    Talks on phone: Not on file    Gets together: Not on file    Attends religious service: Not on file    Active member of club or organization: Not on file    Attends meetings of clubs or organizations: Not on file    Relationship status: Not on file  . Intimate partner violence:    Fear of current or ex partner: Not on file    Emotionally abused: Not on file    Physically abused: Not on file    Forced sexual activity: Not on file  Other Topics Concern  . Not on file  Social History Narrative   Caffeine daily    [employer], [employer] no diploma   Married '66   1 dtr- '78; 1 son '71; 2 grandchildren   Occupation: retired 04   Dad with alzheimers-had to place in [place] (summer '10)          Allergies: Allergies  Allergen Reactions  . Codeine Other (See Comments)    Makes her crazy  . Propoxyphene Hcl Itching    *DARVOCET     Medications:  Current Outpatient Medications  Medication Sig Dispense Refill  . acetaminophen (TYLENOL) 500 MG tablet Take 500 mg by mouth every 8 (eight) hours as needed for mild pain.     Marland Kitchen betamethasone dipropionate (DIPROLENE) 0.05 % cream Apply 1 application topically 2 (two) times daily as needed (IRRITATION).    Marland Kitchen cetirizine (ZYRTEC) 10 MG tablet Take 1 tablet (10 mg total) by mouth daily. 90  tablet 1  . cholecalciferol (VITAMIN D) 1000 units tablet Take 1 tablet by mouth daily.     . Dulaglutide (TRULICITY) 1.06 YI/9.4WN SOPN Inject under skin 0.75 mg weekly in  a.m. 12 pen 0  . Fluocinolone Acetonide (DERMOTIC) 0.01 % OIL Place 1 drop in ear(s) daily as needed (itching).     . gabapentin (NEURONTIN) 100 MG capsule TAKE 2 CAPSULES BY MOUTH AT BEDTIME (Patient taking differently: Take 200-300 mg by mouth at bedtime. TAKE 2-3 CAPSULES BY MOUTH AT BEDTIME) 180 capsule 1  . insulin NPH Human (NOVOLIN N RELION) 100 UNIT/ML injection Inject under skin 25 units in am and 25 units at bedtime (Patient taking differently: Inject 25 Units into the skin 2 (two) times daily before a meal. Inject under skin 25 units in am and 25 units at bedtime) 20 mL 3  . Insulin Regular Human (NOVOLIN R IJ) Inject 5-25 Units as directed See admin instructions. Pt states she takes 20-25 units at breakfast, 5-10 units at lunch, 20-25 units at dinner and takes a bedtime dose also.    . losartan (COZAAR) 25 MG tablet Take 1 tablet (25 mg total) by mouth daily. 90 tablet 1  . lovastatin (MEVACOR) 20 MG tablet Take 1 tablet (20 mg total) by mouth at bedtime. 90 tablet 1  . metFORMIN (GLUCOPHAGE) 1000 MG tablet TAKE 1 TABLET TWICE A DAY WITH A MEAL 180 tablet 1  . metoprolol succinate (TOPROL-XL) 25 MG 24 hr tablet TAKE 1 TABLET BY MOUTH EVERY DAY 90 tablet 1  . naproxen sodium (ANAPROX) 220 MG tablet Take 220 mg by mouth daily as needed (pain).    . NOVOLIN R RELION 100 UNIT/ML injection INJECT 12 TO 30 UNITS INTO THE SKIN 3 TIMES DAILY BEFORE MEALS 3 vial 1  . omeprazole (PRILOSEC) 40 MG capsule Take 80 mg by mouth 2 (two) times daily.    Marland Kitchen OVER THE COUNTER MEDICATION Apply 1-3 application topically at bedtime. TOPRICIN FOOT CREAM - NEUROPATHY FOOT CREAM **APPLIES TO BOTH FEET AT BEDTIME**    . Probiotic Product (PROBIOTIC DAILY PO) Take 1 capsule by mouth daily.    . psyllium (METAMUCIL) 58.6 % packet Take 1 packet by  mouth daily.    . silver sulfADIAZINE (SILVADENE) 1 % cream Apply 1 application daily topically. Apply to foot    . vitamin B-12 (CYANOCOBALAMIN) 1000 MCG tablet Take 1,000 mcg daily by mouth.     Current Facility-Administered Medications  Medication Dose Route Frequency Provider Last Rate Last Dose  . 0.9 %  sodium chloride infusion  500 mL Intravenous Once Ladene Artist, MD        Review of Systems: Review of Systems  Constitutional: Positive for fatigue.  Respiratory: Positive for cough.   Gastrointestinal: Positive for constipation and diarrhea.  All other systems reviewed and are negative.    PHYSICAL EXAMINATION Blood pressure (!) 153/61, pulse 89, temperature (!) 97.5 F (36.4 C), temperature source Oral, resp. rate 18, height 5' 4" (1.626 m), weight 230 lb 3.2 oz (104.4 kg), SpO2 97 %.  ECOG PERFORMANCE STATUS: 1 - Symptomatic but completely ambulatory  Physical Exam  Constitutional: She is oriented to person, place, and time and well-developed, well-nourished, and in no distress. No distress.  HENT:  Head: Normocephalic and atraumatic.  Mouth/Throat: Oropharynx is clear and moist. No oropharyngeal exudate.  Eyes: Pupils are equal, round, and reactive to light. Conjunctivae and EOM are normal. No scleral icterus.  Neck: No thyromegaly present.  Cardiovascular: Normal rate, regular rhythm and normal heart sounds.  No murmur heard. Pulmonary/Chest: Effort normal and breath sounds normal. No respiratory distress. She has no wheezes. She has no rales.  Abdominal: Soft. Bowel  sounds are normal. She exhibits no distension and no mass. There is no tenderness. There is no guarding.  Musculoskeletal: She exhibits no edema.  Lymphadenopathy:    She has no cervical adenopathy.  Neurological: She is alert and oriented to person, place, and time. She has normal reflexes. No cranial nerve deficit.  Skin: Skin is warm and dry. No rash noted. She is not diaphoretic. No erythema. No  pallor.     LABORATORY DATA: I have personally reviewed the data as listed: Appointment on 05/19/2017  Component Date Value Ref Range Status  . Ferritin 05/19/2017 85  9 - 269 ng/mL Final   Performed at [facility], [place] [address], [place], [place] [address]  . Iron 05/19/2017 32* 41 - 142 ug/dL Final  . TIBC 05/19/2017 311  236 - 444 ug/dL Final  . Saturation Ratios 05/19/2017 10* 21 - 57 % Final  . UIBC 05/19/2017 279  ug/dL Final   Performed at [facility], [place] [address] [place], [place] [address]  . Sodium 05/19/2017 138  136 - 145 mmol/L Final  . Potassium 05/19/2017 3.9  3.5 - 5.1 mmol/L Final  . Chloride 05/19/2017 101  98 - 109 mmol/L Final  . CO2 05/19/2017 28  22 - 29 mmol/L Final  . Glucose, Bld 05/19/2017 132  70 - 140 mg/dL Final  . BUN 05/19/2017 14  7 - 26 mg/dL Final  . Creatinine 05/19/2017 0.88  0.60 - 1.10 mg/dL Final  . Calcium 05/19/2017 9.8  8.4 - 10.4 mg/dL Final  . Total Protein 05/19/2017 7.6  6.4 - 8.3 g/dL Final  . Albumin 05/19/2017 3.8  3.5 - 5.0 g/dL Final  . AST 05/19/2017 20  5 - 34 U/L Final  . ALT 05/19/2017 20  0 - 55 U/L Final  . Alkaline Phosphatase 05/19/2017 65  40 - 150 U/L Final  . Total Bilirubin 05/19/2017 0.3  0.2 - 1.2 mg/dL Final  . GFR, Est Non Af Am 05/19/2017 >60  >60 mL/min Final  . GFR, Est AFR Am 05/19/2017 >60  >60 mL/min Final   Comment: (NOTE) The eGFR has been calculated using the CKD EPI equation. This calculation has not been validated in all clinical situations. eGFR's persistently <60 mL/min signify possible Chronic Kidney Disease.   Georgiann Hahn gap 05/19/2017 9  3 - 11 Final   Performed at [facility], [place] [address] [place], [place] [address]  . WBC Count 05/19/2017 11.9* 3.9 - 10.3 K/uL Final  . RBC 05/19/2017 4.63  3.70 - 5.45 MIL/uL Final  . Hemoglobin 05/19/2017 12.1  11.6 - 15.9 g/dL Final  . HCT 05/19/2017 38.3  34.8 - 46.6 % Final   . MCV 05/19/2017 82.7  79.5 - 101.0 fL Final  . MCH 05/19/2017 26.1  25.1 - 34.0 pg Final  . MCHC 05/19/2017 31.6  31.5 - 36.0 g/dL Final  . RDW 05/19/2017 16.1* 11.2 - 14.5 % Final  . Platelet Count 05/19/2017 408* 145 - 400 K/uL Final  . Neutrophils Relative % 05/19/2017 64  % Final  . Neutro Abs 05/19/2017 7.5* 1.5 - 6.5 K/uL Final  . Lymphocytes Relative 05/19/2017 28  % Final  . Lymphs Abs 05/19/2017 3.4* 0.9 - 3.3 K/uL Final  . Monocytes Relative 05/19/2017 6  % Final  . Monocytes Absolute 05/19/2017 0.7  0.1 - 0.9 K/uL Final  . Eosinophils Relative 05/19/2017 2  % Final  . Eosinophils Absolute 05/19/2017 0.3  0.0 - 0.5 K/uL Final  . Basophils Relative 05/19/2017 0  % Final  . Basophils Absolute 05/19/2017 0.1  0.0 - 0.1 K/uL Final   Performed at [facility], [place] [address], [place], [place] [address]       Ardath Sax, MD

## 2017-06-16 ENCOUNTER — Other Ambulatory Visit: Payer: Self-pay | Admitting: Emergency Medicine

## 2017-06-16 DIAGNOSIS — D649 Anemia, unspecified: Secondary | ICD-10-CM

## 2017-06-19 ENCOUNTER — Encounter (INDEPENDENT_AMBULATORY_CARE_PROVIDER_SITE_OTHER): Payer: Self-pay | Admitting: Orthopedic Surgery

## 2017-06-19 ENCOUNTER — Ambulatory Visit (INDEPENDENT_AMBULATORY_CARE_PROVIDER_SITE_OTHER): Payer: Medicare Other | Admitting: Orthopedic Surgery

## 2017-06-19 ENCOUNTER — Inpatient Hospital Stay: Payer: Medicare Other | Attending: Hematology and Oncology

## 2017-06-19 VITALS — Ht 64.0 in | Wt 230.0 lb

## 2017-06-19 DIAGNOSIS — D649 Anemia, unspecified: Secondary | ICD-10-CM

## 2017-06-19 DIAGNOSIS — L97521 Non-pressure chronic ulcer of other part of left foot limited to breakdown of skin: Secondary | ICD-10-CM

## 2017-06-19 DIAGNOSIS — I10 Essential (primary) hypertension: Secondary | ICD-10-CM | POA: Insufficient documentation

## 2017-06-19 DIAGNOSIS — D509 Iron deficiency anemia, unspecified: Secondary | ICD-10-CM | POA: Diagnosis not present

## 2017-06-19 DIAGNOSIS — L97511 Non-pressure chronic ulcer of other part of right foot limited to breakdown of skin: Secondary | ICD-10-CM

## 2017-06-19 DIAGNOSIS — E119 Type 2 diabetes mellitus without complications: Secondary | ICD-10-CM | POA: Diagnosis not present

## 2017-06-19 LAB — CBC WITH DIFFERENTIAL (CANCER CENTER ONLY)
Basophils Absolute: 0 10*3/uL (ref 0.0–0.1)
Basophils Relative: 1 %
Eosinophils Absolute: 0.2 10*3/uL (ref 0.0–0.5)
Eosinophils Relative: 3 %
HCT: 39.3 % (ref 34.8–46.6)
Hemoglobin: 12.5 g/dL (ref 11.6–15.9)
Lymphocytes Relative: 25 %
Lymphs Abs: 2.1 10*3/uL (ref 0.9–3.3)
MCH: 26.9 pg (ref 25.1–34.0)
MCHC: 31.8 g/dL (ref 31.5–36.0)
MCV: 84.7 fL (ref 79.5–101.0)
Monocytes Absolute: 0.6 10*3/uL (ref 0.1–0.9)
Monocytes Relative: 7 %
Neutro Abs: 5.6 10*3/uL (ref 1.5–6.5)
Neutrophils Relative %: 64 %
Platelet Count: 338 10*3/uL (ref 145–400)
RBC: 4.64 MIL/uL (ref 3.70–5.45)
RDW: 15.4 % — ABNORMAL HIGH (ref 11.2–14.5)
WBC Count: 8.5 10*3/uL (ref 3.9–10.3)

## 2017-06-19 NOTE — Progress Notes (Signed)
Office Visit Note   [name]           Date of Birth: 1945-07-01           MRN: [id number] Visit Date: 06/19/2017              Requested by: Lucretia Kern, DO [address] [place], [place] [address] PCP: Lucretia Kern, DO  Chief Complaint  Patient presents with  . Left Foot - Pain    New blister at great toe      HPI: Patient is a 72 year old woman who presents with a new blister over the medial aspect of the MTP joint.  Patient states she woke up with a new blister left foot.  Patient denies any redness.  Assessment & Plan: Visit Diagnoses:  1. Non-pressure chronic ulcer of other part of right foot limited to breakdown of skin ([place])     Plan: Patient was placed in a postoperative shoe with felt relieving padding around the ulcer to a low pressure we will not have her pop this blister at this time.  Reevaluate in 3 weeks.  Follow-Up Instructions: Return in about 3 weeks (around 07/10/2017).   Ortho Exam  Patient is alert, oriented, no adenopathy, well-dressed, normal affect, normal respiratory effort. Examination patient has good pulses she has good dorsiflexion of the ankle there is no redness no cellulitis she has a fluid-filled blister over the medial aspect the MTP joint left great toe this blister is about 10 mm in diameter and 7 mm dominant.  There is no redness no cellulitis there is clear serous fluid within the blister.  Patient has no signs of infection no Charcot arthropathy.  Imaging: No results found. No images are attached to the encounter.  Labs: Lab Results  Component Value Date   HGBA1C 7.4 (H) 03/29/2017   HGBA1C 7.3 12/26/2016   HGBA1C 7.0 09/19/2016   ESRSEDRATE 36 (H) 02/24/2017   CRP 1.2 (H) 02/24/2017   LABURIC 5.5 09/01/2015   LABORGA GROUP B STREP (S.AGALACTIAE) ISOLATED 12/26/2014     Lab Results  Component Value Date   ALBUMIN 3.8 05/19/2017   ALBUMIN 3.6 04/21/2017   ALBUMIN 3.6 02/24/2017   PREALBUMIN 19.4  03/05/2012   LABURIC 5.5 09/01/2015    Body mass index is 39.48 kg/m.  Orders:  No orders of the defined types were placed in this encounter.  No orders of the defined types were placed in this encounter.    Procedures: No procedures performed  Clinical Data: No additional findings.  ROS:  All other systems negative, except as noted in the HPI. Review of Systems  Objective: Vital Signs: Ht 5\' 4"  (1.626 m)   Wt 230 lb (104.3 kg)   BMI 39.48 kg/m   Specialty Comments:  No specialty comments available.  PMFS History: Patient Active Problem List   Diagnosis Date Noted  . Iron deficiency anemia 05/11/2017  . Obesity, Class III, BMI 40-49.9 (morbid obesity) ([place]) 04/24/2017  . Acute midline low back pain without sciatica 04/11/2017  . Subacute osteomyelitis, right ankle and foot ([place])   . Anemia 02/24/2017  . Right foot ulcer, limited to breakdown of skin ([place]) 09/26/2016  . Osteomyelitis of toe of left foot ([place])   . Baker's cyst 07/10/2016  . Tachycardia 03/04/2016  . Onychomycosis 12/07/2015  . Non-pressure chronic ulcer of other part of right foot limited to breakdown of skin ([place]) 12/07/2015  . Upper airway cough syndrome 03/05/2014  . History of  amputation of lesser toe of right foot ([place]) 04/15/2013  . Pain in joint, shoulder region, s/p RTC surgery - followed by Dr. Sharol Given 04/15/2013  . Essential hypertension 12/05/2006  . Uncontrolled type 2 diabetes mellitus with peripheral circulatory disorder ([place]) 10/12/2006  . Hyperlipidemia 10/12/2006  . Allergic rhinitis 10/12/2006  . GERD - Followed by Dr. Fuller Plan 10/12/2006  . IRRITABLE BOWEL SYNDROME - followed by Dr. Fuller Plan 10/12/2006  . Peripheral neuropathy 10/11/2006  . ALOPECIA NEC 10/11/2006   Past Medical History:  Diagnosis Date  . Alopecia   . Anemia, mild   . Arthritis   . Chronic cough    sees pulmonologist  . Chronic pain    chest wall and abd - s/p extensive eval  . Diabetes mellitus with  neuropathy ([place])    sees endocrine  . Diverticulosis   . Fatty liver   . GERD (gastroesophageal reflux disease)    takes Nexium bid, hx erosive esophagitis  . Headache(784.0)    occasionally;r/t sinus   . History of colon polyps   . HTN (hypertension)   . Hx of amputation of lesser toe ([place])    sees podiatrist  . Hyperlipemia   . IBS (irritable bowel syndrome)   . Insomnia    takes Elavil nightly  . Joint pain   . Neuropathy   . Osteomyelitis ([place])   . Pneumonia   . PONV (postoperative nausea and vomiting)   . Seasonal allergies    takes Zyrtec daily  . Sinus tachycardia     Family History  Problem Relation Age of Onset  . Lung cancer Mother 39       smoked heavily  . Emphysema Mother   . Hypertension Father   . Hyperlipidemia Father   . Diabetes Father   . Coronary artery disease Father   . Dementia Father   . COPD Father        smoked  . Stomach cancer Paternal Aunt   . Brain cancer Paternal Uncle   . Irritable bowel syndrome Unknown        Several family members on fathers side   . Diabetes Other   . Stomach cancer Maternal Aunt   . Lung cancer Paternal Uncle   . Heart disease Paternal Uncle   . Colon cancer Neg Hx     Past Surgical History:  Procedure Laterality Date  . AMPUTATION  12/28/2011   Procedure: AMPUTATION DIGIT;  Surgeon: Newt Minion, MD;  Location: [facility];  Service: Orthopedics;  Laterality: Right;  Right Foot 2nd Toe Amputation at MTP (metatarsophalangeal joint)  . AMPUTATION Right 04/25/2012   Procedure: Right Foot 3rd Toe Amputation;  Surgeon: Newt Minion, MD;  Location: [facility];  Service: Orthopedics;  Laterality: Right;  Right Foot Third Toe Amputation   . AMPUTATION Right 07/27/2012   Procedure: Right 4th Toe Amputation at Metatarsophalangeal;  Surgeon: Newt Minion, MD;  Location: [facility];  Service: Orthopedics;  Laterality: Right;  Right 4th Toe Amputation at Metatarsophalangeal  . AMPUTATION Left 07/15/2016   Procedure: Left 2nd Ray  Amputation;  Surgeon: Newt Minion, MD;  Location: [facility];  Service: Orthopedics;  Laterality: Left;  . AMPUTATION Left 11/18/2016   Procedure: Left 3rd and 4th Ray Amputation;  Surgeon: Newt Minion, MD;  Location: [facility];  Service: Orthopedics;  Laterality: Left;  . AMPUTATION Right 03/29/2017   Procedure: RIGHT FOOT 3RD AND 4TH RAY AMPUTATION;  Surgeon: Newt Minion, MD;  Location: [place] [facility];  Service: Orthopedics;  Laterality: Right;  . COLONOSCOPY    . LAPAROSCOPIC APPENDECTOMY  01/05/2011   Procedure: APPENDECTOMY LAPAROSCOPIC;  Surgeon: Pedro Earls, MD;  [facility];  Service: General;  Laterality: N/A;  . OOPHORECTOMY  2001  . ROTATOR CUFF REPAIR Right    x 2  . TUBAL LIGATION    . VAGINAL HYSTERECTOMY  2001   Social History   Occupational History  . Occupation: Retired   Tobacco Use  . Smoking status: Never Smoker  . Smokeless tobacco: Never Used  Substance and Sexual Activity  . Alcohol use: No  . Drug use: No  . Sexual activity: Yes    Birth control/protection: Surgical

## 2017-06-22 ENCOUNTER — Telehealth: Payer: Self-pay | Admitting: *Deleted

## 2017-06-22 NOTE — Telephone Encounter (Signed)
"  I can see CBC is the only lab drawn this week.  I usually have Iron, Ferritin and CMP before I'm see to determine if I need the iron infusion.  I can come in today to prevent two trips next week."

## 2017-06-23 ENCOUNTER — Telehealth: Payer: Self-pay

## 2017-06-23 ENCOUNTER — Inpatient Hospital Stay: Payer: Medicare Other

## 2017-06-23 ENCOUNTER — Inpatient Hospital Stay (HOSPITAL_BASED_OUTPATIENT_CLINIC_OR_DEPARTMENT_OTHER): Payer: Medicare Other | Admitting: Nurse Practitioner

## 2017-06-23 ENCOUNTER — Encounter: Payer: Self-pay | Admitting: Nurse Practitioner

## 2017-06-23 VITALS — BP 155/63 | HR 79 | Temp 97.9°F | Resp 18 | Ht 64.0 in | Wt 232.8 lb

## 2017-06-23 DIAGNOSIS — D509 Iron deficiency anemia, unspecified: Secondary | ICD-10-CM | POA: Diagnosis not present

## 2017-06-23 DIAGNOSIS — E119 Type 2 diabetes mellitus without complications: Secondary | ICD-10-CM | POA: Diagnosis not present

## 2017-06-23 DIAGNOSIS — D508 Other iron deficiency anemias: Secondary | ICD-10-CM

## 2017-06-23 DIAGNOSIS — I10 Essential (primary) hypertension: Secondary | ICD-10-CM | POA: Diagnosis not present

## 2017-06-23 NOTE — Progress Notes (Addendum)
  [facility] OFFICE PROGRESS NOTE   Diagnosis: Iron deficiency anemia  INTERVAL HISTORY:   [name] returns as scheduled.  She is a 72 year old woman previously followed by Dr. Lebron Conners for iron deficiency anemia.  Upper endoscopy in February of this year showed significant gastritis, felt to be potential source of GI blood loss.  Colonoscopy also in February showed no source for GI blood loss.  She received IV ferrous gluconate weekly x4 beginning 03/13/2017.  She received an additional dose on 05/22/2017.  Most recent labs done 06/19/2017 show hemoglobin and MCV are now in normal range.  She reports tolerating the IV iron with no problem.  Specifically no signs of allergic reaction.  She overall feels better coinciding with improvement in the hemoglobin.  She denies any bleeding.  No shortness of breath.  She consumes an essentially normal diet.  She continues oral iron twice a day.  She notes some constipation when taking oral iron.  She takes a laxative as needed.  Objective:  Vital signs in last 24 hours:  Blood pressure (!) 155/63, pulse 79, temperature 97.9 F (36.6 C), temperature source Oral, resp. rate 18, height 5\' 4"  (1.626 m), weight 232 lb 12.8 oz (105.6 kg), SpO2 97 %.    HEENT: No thrush or ulcers. Resp: Lungs clear bilaterally. Cardio: Regular rate and rhythm. GI: Abdomen soft and nontender.  No hepatosplenomegaly. Vascular: No leg edema.   Lab Results:  Lab Results  Component Value Date   WBC 8.5 06/19/2017   HGB 12.5 06/19/2017   HCT 39.3 06/19/2017   MCV 84.7 06/19/2017   PLT 338 06/19/2017   NEUTROABS 5.6 06/19/2017    Imaging:  No results found.  Medications: I have reviewed the patient's current medications.  Assessment/Plan: 1. Anemia secondary to iron deficiency, possible GI blood loss related to gastritis status post IV ferrous gluconate weekly x4 beginning 03/13/2017, single dose of IV ferrous gluconate 05/22/2017.  Hemoglobin/MCV  corrected into normal range. 2. Upper endoscopy 02/15/2017-diffuse moderate inflammation characterized by congestion, erythema, friability and granularity in the gastric body, posterior wall of the stomach and gastric antrum.  Biopsy GASTRIC ANTRAL MUCOSA WITH NON-SPECIFIC REACTIVE GASTROPATHY AND FEW SUBEPITHELIAL CRYSTALLINE IRON DEPOSITS, CONSISTENT WITH IRON PILL GASTROPATHY. GASTRIC OXYNTIC MUCOSA WITH PARIETAL CELL HYPERPLASIA AS CAN BE SEEN IN HYPERGASTRINEMIC STATES SUCH AS PPI THERAPY. WARTHIN-STARRY STAIN IS NEGATIVE FOR HELICOBACTER PYLORI 3. Colonoscopy 02/15/2017-6 mm polyp in the transverse colon (TUBULAR ADENOMA. NEGATIVE FOR HIGH GRADE DYSPLASIA OR MALIGNANCY) 4. Diabetes 5. Hypertension  Disposition: Beth Holden is a 72 year old woman with iron deficiency anemia.  She received IV ferrous gluconate with correction of the hemoglobin/MCV into normal range.  Plan for observation.  She will return for lab and follow-up in 3 months.  She will contact the office in the interim with any problems.  We specifically discussed signs/symptoms suggestive of progressive anemia.  Patient seen with Dr. Benay Spice.     Ned Card ANP/GNP-BC   06/23/2017  1:03 PM  This was a shared visit with Ned Card.  Beth Holden has a history of iron deficiency anemia.  She has a long-standing history of microcytic anemia.  The anemia has responded to iron replacement therapy.  She is undergone an evaluation for a source of blood loss.  The iron deficiency may be related to blood loss from gastritis.  She will continue oral iron.  Julieanne Manson, MD

## 2017-06-23 NOTE — Telephone Encounter (Signed)
Printed avs and calender of upcoming appointment per 6/14 los for lab and f/u on 9/13 ([facility])

## 2017-06-24 ENCOUNTER — Other Ambulatory Visit: Payer: Self-pay | Admitting: Nurse Practitioner

## 2017-06-24 DIAGNOSIS — D508 Other iron deficiency anemias: Secondary | ICD-10-CM

## 2017-07-06 ENCOUNTER — Ambulatory Visit (INDEPENDENT_AMBULATORY_CARE_PROVIDER_SITE_OTHER): Payer: Self-pay

## 2017-07-06 ENCOUNTER — Encounter (INDEPENDENT_AMBULATORY_CARE_PROVIDER_SITE_OTHER): Payer: Self-pay | Admitting: Orthopedic Surgery

## 2017-07-06 ENCOUNTER — Ambulatory Visit (INDEPENDENT_AMBULATORY_CARE_PROVIDER_SITE_OTHER): Payer: Medicare Other | Admitting: Orthopedic Surgery

## 2017-07-06 VITALS — Ht 64.0 in | Wt 232.0 lb

## 2017-07-06 DIAGNOSIS — M7541 Impingement syndrome of right shoulder: Secondary | ICD-10-CM | POA: Diagnosis not present

## 2017-07-06 DIAGNOSIS — M25511 Pain in right shoulder: Secondary | ICD-10-CM

## 2017-07-06 DIAGNOSIS — Z89421 Acquired absence of other right toe(s): Secondary | ICD-10-CM

## 2017-07-06 MED ORDER — METHYLPREDNISOLONE ACETATE 40 MG/ML IJ SUSP
40.0000 mg | INTRAMUSCULAR | Status: AC | PRN
  Administered 2017-07-06: 40 mg via INTRA_ARTICULAR

## 2017-07-06 MED ORDER — LIDOCAINE HCL 1 % IJ SOLN
5.0000 mL | INTRAMUSCULAR | Status: AC | PRN
  Administered 2017-07-06: 5 mL

## 2017-07-06 NOTE — Progress Notes (Signed)
Office Visit Note   [name]           Date of Birth: 05-Jan-1946           MRN: [id number] Visit Date: 07/06/2017              Requested by: Lucretia Kern, DO [address] [place], [place] [address] PCP: Lucretia Kern, DO  Chief Complaint  Patient presents with  . Right Foot - Follow-up    03/29/17 right foot 3rd and 4th ray amputation       HPI: Patient is a 72 year old woman who presents follow-up status post ray amputation of the right foot with onychomycotic nails on both feet with impingement symptoms of her right shoulder.  Patient has a history of previous rotator cuff repair of the right shoulder and she states she did fall on her shoulder a few weeks ago.  Assessment & Plan: Visit Diagnoses:  1. Acute pain of right shoulder   2. History of amputation of lesser toe of right foot ([place])   3. Impingement syndrome of right shoulder     Plan: The right shoulder was injected without complications.  Nails were trimmed x4.  Follow-Up Instructions: Return in about 3 months (around 10/06/2017).   Ortho Exam  Patient is alert, oriented, no adenopathy, well-dressed, normal affect, normal respiratory effort. Examination patient has well-healed her surgical incision no complicating features she is quite pleased that the ulcer has completely resolved.  She does have pain with Neer and Hawkins impingement test of the right shoulder.  After informed consent the shoulder was injected from the posterior portal.  Imaging: Xr Shoulder Right  Result Date: 07/06/2017 2 view radiographs of the right shoulder shows previous arthroscopic rotator cuff repair of the glenohumeral joint is congruent.  No images are attached to the encounter.  Labs: Lab Results  Component Value Date   HGBA1C 7.4 (H) 03/29/2017   HGBA1C 7.3 12/26/2016   HGBA1C 7.0 09/19/2016   ESRSEDRATE 36 (H) 02/24/2017   CRP 1.2 (H) 02/24/2017   LABURIC 5.5 09/01/2015   LABORGA GROUP B STREP  (S.AGALACTIAE) ISOLATED 12/26/2014     Lab Results  Component Value Date   ALBUMIN 3.8 05/19/2017   ALBUMIN 3.6 04/21/2017   ALBUMIN 3.6 02/24/2017   PREALBUMIN 19.4 03/05/2012   LABURIC 5.5 09/01/2015    Body mass index is 39.82 kg/m.  Orders:  Orders Placed This Encounter  Procedures  . Large Joint Inj  . XR Shoulder Right   No orders of the defined types were placed in this encounter.    Procedures: Large Joint Inj: R subacromial bursa on 07/06/2017 5:52 PM Indications: diagnostic evaluation and pain Details: 22 G 1.5 in needle  Arthrogram: No  Medications: 5 mL lidocaine 1 %; 40 mg methylPREDNISolone acetate 40 MG/ML Outcome: tolerated well, no immediate complications Procedure, treatment alternatives, risks and benefits explained, specific risks discussed. Consent was given by the patient. Immediately prior to procedure a time out was called to verify the correct patient, procedure, equipment, support staff and site/side marked as required. Patient was prepped and draped in the usual sterile fashion.      Clinical Data: No additional findings.  ROS:  All other systems negative, except as noted in the HPI. Review of Systems  Objective: Vital Signs: Ht 5\' 4"  (1.626 m)   Wt 232 lb (105.2 kg)   BMI 39.82 kg/m   Specialty Comments:  No specialty comments available.  PMFS History: Patient  Active Problem List   Diagnosis Date Noted  . Impingement syndrome of right shoulder 07/06/2017  . Iron deficiency anemia 05/11/2017  . Obesity, Class III, BMI 40-49.9 (morbid obesity) ([place]) 04/24/2017  . Acute midline low back pain without sciatica 04/11/2017  . Subacute osteomyelitis, right ankle and foot ([place])   . Anemia 02/24/2017  . Right foot ulcer, limited to breakdown of skin ([place]) 09/26/2016  . Osteomyelitis of toe of left foot ([place])   . Baker's cyst 07/10/2016  . Tachycardia 03/04/2016  . Onychomycosis 12/07/2015  . Non-pressure chronic ulcer of other  part of right foot limited to breakdown of skin ([place]) 12/07/2015  . Upper airway cough syndrome 03/05/2014  . History of amputation of lesser toe of right foot ([place]) 04/15/2013  . Acute pain of right shoulder 04/15/2013  . Essential hypertension 12/05/2006  . Uncontrolled type 2 diabetes mellitus with peripheral circulatory disorder ([place]) 10/12/2006  . Hyperlipidemia 10/12/2006  . Allergic rhinitis 10/12/2006  . GERD - Followed by Dr. Fuller Plan 10/12/2006  . IRRITABLE BOWEL SYNDROME - followed by Dr. Fuller Plan 10/12/2006  . Peripheral neuropathy 10/11/2006  . ALOPECIA NEC 10/11/2006   Past Medical History:  Diagnosis Date  . Alopecia   . Anemia, mild   . Arthritis   . Chronic cough    sees pulmonologist  . Chronic pain    chest wall and abd - s/p extensive eval  . Diabetes mellitus with neuropathy ([place])    sees endocrine  . Diverticulosis   . Fatty liver   . GERD (gastroesophageal reflux disease)    takes Nexium bid, hx erosive esophagitis  . Headache(784.0)    occasionally;r/t sinus   . History of colon polyps   . HTN (hypertension)   . Hx of amputation of lesser toe ([place])    sees podiatrist  . Hyperlipemia   . IBS (irritable bowel syndrome)   . Insomnia    takes Elavil nightly  . Joint pain   . Neuropathy   . Osteomyelitis ([place])   . Pneumonia   . PONV (postoperative nausea and vomiting)   . Seasonal allergies    takes Zyrtec daily  . Sinus tachycardia     Family History  Problem Relation Age of Onset  . Lung cancer Mother 42       smoked heavily  . Emphysema Mother   . Hypertension Father   . Hyperlipidemia Father   . Diabetes Father   . Coronary artery disease Father   . Dementia Father   . COPD Father        smoked  . Stomach cancer Paternal Aunt   . Brain cancer Paternal Uncle   . Irritable bowel syndrome Unknown        Several family members on fathers side   . Diabetes Other   . Stomach cancer Maternal Aunt   . Lung cancer Paternal Uncle   . Heart  disease Paternal Uncle   . Colon cancer Neg Hx     Past Surgical History:  Procedure Laterality Date  . AMPUTATION  12/28/2011   Procedure: AMPUTATION DIGIT;  Surgeon: Newt Minion, MD;  Location: [facility];  Service: Orthopedics;  Laterality: Right;  Right Foot 2nd Toe Amputation at MTP (metatarsophalangeal joint)  . AMPUTATION Right 04/25/2012   Procedure: Right Foot 3rd Toe Amputation;  Surgeon: Newt Minion, MD;  Location: [facility];  Service: Orthopedics;  Laterality: Right;  Right Foot Third Toe Amputation   . AMPUTATION Right 07/27/2012   Procedure:  Right 4th Toe Amputation at Metatarsophalangeal;  Surgeon: Newt Minion, MD;  Location: [facility];  Service: Orthopedics;  Laterality: Right;  Right 4th Toe Amputation at Metatarsophalangeal  . AMPUTATION Left 07/15/2016   Procedure: Left 2nd Ray Amputation;  Surgeon: Newt Minion, MD;  Location: [facility];  Service: Orthopedics;  Laterality: Left;  . AMPUTATION Left 11/18/2016   Procedure: Left 3rd and 4th Ray Amputation;  Surgeon: Newt Minion, MD;  Location: [facility];  Service: Orthopedics;  Laterality: Left;  . AMPUTATION Right 03/29/2017   Procedure: RIGHT FOOT 3RD AND 4TH RAY AMPUTATION;  Surgeon: Newt Minion, MD;  Location: [facility];  Service: Orthopedics;  Laterality: Right;  . COLONOSCOPY    . LAPAROSCOPIC APPENDECTOMY  01/05/2011   Procedure: APPENDECTOMY LAPAROSCOPIC;  Surgeon: Pedro [place], MD;  Location: [facility];  Service: General;  Laterality: N/A;  . OOPHORECTOMY  2001  . ROTATOR CUFF REPAIR Right    x 2  . TUBAL LIGATION    . VAGINAL HYSTERECTOMY  2001   Social History   Occupational History  . Occupation: Retired   Tobacco Use  . Smoking status: Never Smoker  . Smokeless tobacco: Never Used  Substance and Sexual Activity  . Alcohol use: No  . Drug use: No  . Sexual activity: Yes    Birth control/protection: Surgical

## 2017-07-28 ENCOUNTER — Ambulatory Visit (INDEPENDENT_AMBULATORY_CARE_PROVIDER_SITE_OTHER): Payer: Medicare Other | Admitting: Internal Medicine

## 2017-07-28 ENCOUNTER — Encounter: Payer: Self-pay | Admitting: Internal Medicine

## 2017-07-28 VITALS — BP 126/80 | HR 75 | Ht 64.0 in | Wt 228.0 lb

## 2017-07-28 DIAGNOSIS — E1165 Type 2 diabetes mellitus with hyperglycemia: Secondary | ICD-10-CM | POA: Diagnosis not present

## 2017-07-28 DIAGNOSIS — E1151 Type 2 diabetes mellitus with diabetic peripheral angiopathy without gangrene: Secondary | ICD-10-CM

## 2017-07-28 DIAGNOSIS — IMO0002 Reserved for concepts with insufficient information to code with codable children: Secondary | ICD-10-CM

## 2017-07-28 DIAGNOSIS — G63 Polyneuropathy in diseases classified elsewhere: Secondary | ICD-10-CM | POA: Diagnosis not present

## 2017-07-28 DIAGNOSIS — E785 Hyperlipidemia, unspecified: Secondary | ICD-10-CM

## 2017-07-28 LAB — POCT GLYCOSYLATED HEMOGLOBIN (HGB A1C): Hemoglobin A1C: 7 % — AB (ref 4.0–5.6)

## 2017-07-28 LAB — LIPID PANEL
Cholesterol: 172 mg/dL (ref 0–200)
HDL: 46.4 mg/dL (ref >39.00)
LDL Cholesterol: 91 mg/dL (ref 0–99)
NonHDL: 125.41
Total CHOL/HDL Ratio: 4
Triglycerides: 171 mg/dL — ABNORMAL HIGH (ref 0.0–149.0)
VLDL: 34.2 mg/dL (ref 0.0–40.0)

## 2017-07-28 NOTE — Progress Notes (Addendum)
Subjective:     Patient ID: Beth Holden, female   DOB: 05-11-1945, 72 y.o.   MRN: [id number]  HPI Beth Holden is a pleasant 72 y.o. woman returning for f/u for DM2, dx ~2000, uncontrolled, insulin-dependent, with complications (diabetic peripheral neuropathy, multiple toe amputations). Last visit 4 months ago.  At last visit, we tried to start Trulicity, however, she developed nausea, weakness, diarrhea/constipation.  I advised her to increase NPH.  She had a steroid inj in shoulder last mo >> sugars higher.  Last HbA1C was: Lab Results  Component Value Date   HGBA1C 7.4 (H) 03/29/2017   HGBA1C 7.3 12/26/2016   HGBA1C 7.0 09/19/2016   She is now on: - Metformin  1000 mg 2x a day  Insulin Before breakfast Before lunch Before dinner At bedtime  Regular  20 units with a small meal  25 units with a larger meal  5 units with a smaller meal  10 units with a larger meal  20 units with a smaller meal  25 units with a larger meal   NPH 30 x x 30    She also tried: - Levemir 47 units in am and 37 units in pm >> $800 for 3 mo supply - Januvia 100 mg daily - $450 for 3 mo - Invokana 100 mg - $455 for 3 mo - Took Byetta before We discussed about starting her on a VGo mechanical pump in the past. She had an appointment with diabetes education but decided not to pursue it.  She checks her sugars 3X a day: Very variable, with low blood sugars occasionally during the night: - am:  78, 101-163, 195 >> 111-221 >>109-206, 254 - 2h after b'fast :130, 131-242 >> 184, 190, 307 >> 111-181 - prelunch: 110-214 >> n/c  - 2-3h after lunch:  212 >> 63, 203-332 >> 113-200 - before dinner:173 >> 153, 468 >> 66, 110, 261 - after dinner:203 >> 151-204, 451 >> 123-251, 281 - bedtime:  138-238, 334 >> 154, 156, 326 >> 100-171, 215 - nighttime: 69, 75-177 >> 60, 97-229, 251 >> 51, 55-152, 179, 282 (mother in law passed away) Lowest: 47 >> 69 >> 51.  Has hypoglycemia awareness in the 70. Highest:   468 - Prednisone >> 282  Meals: - Breakfast: egg, bacon, toast; grits; biscuit; fruit; sometimes skips - Lunch: 1/2 PB sandwich or soup and yoghurt, sometimes crackers - Dinner: meat + vegetables + some starch - Snacks: fruit   -No CKD: BUN  Date Value Ref Range Status  05/19/2017 14 7 - 26 mg/dL Final   Creatinine  Date Value Ref Range Status  05/19/2017 0.88 0.60 - 1.10 mg/dL Final  On enalapril  -+ HL; latest lipid panel: Lab Results  Component Value Date   CHOL 166 11/30/2015   HDL 57.10 11/30/2015   LDLCALC 80 11/30/2015   TRIG 145.0 11/30/2015   CHOLHDL 3 11/30/2015  On Mevacor. - last eye exam in spring 2018: + Mild DR,  no glaucoma (Dr. Zadie Rhine). -+ Numbness and tingling in feet -off amitriptyline, but on gabapentin  Review of Systems Constitutional: no weight gain/no weight loss, + fatigue, + hot flashes, no subjective hypothermia, + nocturia Eyes: no blurry vision, no xerophthalmia ENT: no sore throat, no nodules palpated in throat, no dysphagia, no odynophagia, no hoarseness Cardiovascular: no CP/no SOB/no palpitations/+ leg swelling Respiratory: no cough/no SOB/no wheezing Gastrointestinal: no N/no V/no D/ + C/no acid reflux Musculoskeletal: no muscle aches/+ joint aches Skin: no rashes, no hair loss  Neurological: no tremors/+ numbness/+ tingling/no dizziness  I reviewed pt's medications, allergies, PMH, social hx, family hx, and changes were documented in the history of present illness. Otherwise, unchanged from my initial visit note.  Past Medical History:  Diagnosis Date  . Alopecia   . Anemia, mild   . Arthritis   . Chronic cough    sees pulmonologist  . Chronic pain    chest wall and abd - s/p extensive eval  . Diabetes mellitus with neuropathy ([place])    sees endocrine  . Diverticulosis   . Fatty liver   . GERD (gastroesophageal reflux disease)    takes Nexium bid, hx erosive esophagitis  . Headache(784.0)    occasionally;r/t sinus   .  History of colon polyps   . HTN (hypertension)   . Hx of amputation of lesser toe ([place])    sees podiatrist  . Hyperlipemia   . IBS (irritable bowel syndrome)   . Insomnia    takes Elavil nightly  . Joint pain   . Neuropathy   . Osteomyelitis ([place])   . Pneumonia   . PONV (postoperative nausea and vomiting)   . Seasonal allergies    takes Zyrtec daily  . Sinus tachycardia    Past Surgical History:  Procedure Laterality Date  . AMPUTATION  12/28/2011   Procedure: AMPUTATION DIGIT;  Surgeon: Newt Minion, MD;  Location: [facility];  Service: Orthopedics;  Laterality: Right;  Right Foot 2nd Toe Amputation at MTP (metatarsophalangeal joint)  . AMPUTATION Right 04/25/2012   Procedure: Right Foot 3rd Toe Amputation;  Surgeon: Newt Minion, MD;  Location: [facility];  Service: Orthopedics;  Laterality: Right;  Right Foot Third Toe Amputation   . AMPUTATION Right 07/27/2012   Procedure: Right 4th Toe Amputation at Metatarsophalangeal;  Surgeon: Newt Minion, MD;  Location: [facility];  Service: Orthopedics;  Laterality: Right;  Right 4th Toe Amputation at Metatarsophalangeal  . AMPUTATION Left 07/15/2016   Procedure: Left 2nd Ray Amputation;  Surgeon: Newt Minion, MD;  Location: [facility];  Service: Orthopedics;  Laterality: Left;  . AMPUTATION Left 11/18/2016   Procedure: Left 3rd and 4th Ray Amputation;  Surgeon: Newt Minion, MD;  Location: [facility];  Service: Orthopedics;  Laterality: Left;  . AMPUTATION Right 03/29/2017   Procedure: RIGHT FOOT 3RD AND 4TH RAY AMPUTATION;  Surgeon: Newt Minion, MD;  Location: [facility];  Service: Orthopedics;  Laterality: Right;  . COLONOSCOPY    . LAPAROSCOPIC APPENDECTOMY  01/05/2011   Procedure: APPENDECTOMY LAPAROSCOPIC;  Surgeon: Pedro Earls, MD;  Location: [facility];  Service: General;  Laterality: N/A;  . OOPHORECTOMY  2001  . ROTATOR CUFF REPAIR Right    x 2  . TUBAL LIGATION    . VAGINAL HYSTERECTOMY  2001   Social History   Socioeconomic History  .  Marital status: Married    Spouse name: Not on file  . Number of children: 2  . Years of education: Not on file  . Highest education level: Not on file  Occupational History  . Occupation: Retired   Scientific laboratory technician  . Financial resource strain: Not on file  . Food insecurity:    Worry: Not on file    Inability: Not on file  . Transportation needs:    Medical: Not on file    Non-medical: Not on file  Tobacco Use  . Smoking status: Never Smoker  . Smokeless tobacco: Never Used  Substance and Sexual Activity  . Alcohol  use: No  . Drug use: No  . Sexual activity: Yes    Birth control/protection: Surgical  Lifestyle  . Physical activity:    Days per week: Not on file    Minutes per session: Not on file  . Stress: Not on file  Relationships  . Social connections:    Talks on phone: Not on file    Gets together: Not on file    Attends religious service: Not on file    Active member of club or organization: Not on file    Attends meetings of clubs or organizations: Not on file    Relationship status: Not on file  . Intimate partner violence:    Fear of current or ex partner: Not on file    Emotionally abused: Not on file    Physically abused: Not on file    Forced sexual activity: Not on file  Other Topics Concern  . Not on file  Social History Narrative   Caffeine daily    [employer], [employer] no diploma   Married '66   1 dtr- '78; 1 son '71; 2 grandchildren   Occupation: retired 04   Dad with alzheimers-had to place in [place] (summer '10)         Current Outpatient Medications on File Prior to Visit  Medication Sig Dispense Refill  . acetaminophen (TYLENOL) 500 MG tablet Take 500 mg by mouth every 8 (eight) hours as needed for mild pain.     Marland Kitchen betamethasone dipropionate (DIPROLENE) 0.05 % cream Apply 1 application topically 2 (two) times daily as needed (IRRITATION).    Marland Kitchen cetirizine (ZYRTEC) 10 MG tablet Take 1 tablet (10 mg total) by mouth daily. 90 tablet 1  . cholecalciferol  (VITAMIN D) 1000 units tablet Take 1 tablet by mouth daily.     Marland Kitchen FERROUS SULFATE PO Take by mouth.    . Fluocinolone Acetonide (DERMOTIC) 0.01 % OIL Place 1 drop in ear(s) daily as needed (itching).     . gabapentin (NEURONTIN) 100 MG capsule TAKE 2 CAPSULES BY MOUTH AT BEDTIME (Patient taking differently: Take 200-300 mg by mouth at bedtime. TAKE 2-3 CAPSULES BY MOUTH AT BEDTIME) 180 capsule 1  . insulin NPH Human (NOVOLIN N RELION) 100 UNIT/ML injection Inject under skin 25 units in am and 25 units at bedtime (Patient taking differently: Inject 25 Units into the skin 2 (two) times daily before a meal. Inject under skin 25 units in am and 25 units at bedtime) 20 mL 3  . Insulin Regular Human (NOVOLIN R IJ) Inject 5-25 Units as directed See admin instructions. Pt states she takes 20-25 units at breakfast, 5-10 units at lunch, 20-25 units at dinner and takes a bedtime dose also.    . losartan (COZAAR) 25 MG tablet Take 1 tablet (25 mg total) by mouth daily. 90 tablet 1  . lovastatin (MEVACOR) 20 MG tablet Take 1 tablet (20 mg total) by mouth at bedtime. 90 tablet 1  . metFORMIN (GLUCOPHAGE) 1000 MG tablet TAKE 1 TABLET TWICE A DAY WITH A MEAL 180 tablet 1  . metoprolol succinate (TOPROL-XL) 25 MG 24 hr tablet TAKE 1 TABLET BY MOUTH EVERY DAY 90 tablet 1  . naproxen sodium (ANAPROX) 220 MG tablet Take 220 mg by mouth daily as needed (pain).    . NOVOLIN R RELION 100 UNIT/ML injection INJECT 12 TO 30 UNITS INTO THE SKIN 3 TIMES DAILY BEFORE MEALS 3 vial 1  . omeprazole (PRILOSEC) 40 MG capsule Take 80 mg  by mouth 2 (two) times daily.    Marland Kitchen OVER THE COUNTER MEDICATION Apply 1-3 application topically at bedtime. TOPRICIN FOOT CREAM - NEUROPATHY FOOT CREAM **APPLIES TO BOTH FEET AT BEDTIME**    . Probiotic Product (PROBIOTIC DAILY PO) Take 1 capsule by mouth daily.    . psyllium (METAMUCIL) 58.6 % packet Take 1 packet by mouth daily.    . silver sulfADIAZINE (SILVADENE) 1 % cream Apply 1 application daily  topically. Apply to foot    . vitamin B-12 (CYANOCOBALAMIN) 1000 MCG tablet Take 1,000 mcg daily by mouth.     Current Facility-Administered Medications on File Prior to Visit  Medication Dose Route Frequency Provider Last Rate Last Dose  . 0.9 %  sodium chloride infusion  500 mL Intravenous Once Ladene Artist, MD       Allergies  Allergen Reactions  . Codeine Other (See Comments)    Makes her crazy  . Propoxyphene Hcl Itching    *DARVOCET    Family History  Problem Relation Age of Onset  . Lung cancer Mother 3       smoked heavily  . Emphysema Mother   . Hypertension Father   . Hyperlipidemia Father   . Diabetes Father   . Coronary artery disease Father   . Dementia Father   . COPD Father        smoked  . Stomach cancer Paternal Aunt   . Brain cancer Paternal Uncle   . Irritable bowel syndrome Unknown        Several family members on fathers side   . Diabetes Other   . Stomach cancer Maternal Aunt   . Lung cancer Paternal Uncle   . Heart disease Paternal Uncle   . Colon cancer Neg Hx     Objective:   Physical Exam BP 126/80   Pulse 75   Ht 5\' 4"  (1.626 m)   Wt 228 lb (103.4 kg)   SpO2 96%   BMI 39.14 kg/m  Body mass index is 39.14 kg/m. Wt Readings from Last 3 Encounters:  07/28/17 228 lb (103.4 kg)  07/06/17 232 lb (105.2 kg)  06/23/17 232 lb 12.8 oz (105.6 kg)   Constitutional: overweight, in NAD Eyes: PERRLA, EOMI, no exophthalmos ENT: moist mucous membranes, no thyromegaly, no cervical lymphadenopathy Cardiovascular: RRR, No MRG Respiratory: CTA B Gastrointestinal: abdomen soft, NT, ND, BS+ Musculoskeletal: + Several amputated toes (see below), L Charcot deformity, strength intact in all 4 Skin: moist, warm, no rashes Neurological: no tremor with outstretched hands, DTR normal in all 4   Assessment:     1. 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 HL  4.  Obesity class 3  Plan:     1. DM2 - pt with long-standing, uncontrolled, type 2 diabetes, on basal-bolus insulin regimen and metformin.  Her sugars are usually very fluctuating, a pattern more consistent with insulin deficiency.  We did not check her for this since we do not plan to change her regimen.  At last visit, HbA1c was slightly higher, 7.4% and, due to increased variability in her blood sugars, we discussed about adding Trulicity.  Unfortunately, she could  not tolerate this due to GI side effects and weakness.  She contacted me after the visit and I advised her to stop Trulicity and increase NPH.   - At this visit, sugars are better, but still very fluctuating.  She does have low blood sugars at night, with the lowest being 51.  She had several instances with sugars in the 50s and 60s at night.  They develop usually in the middle of the night, while sugars after dinner are not low.  Therefore, we will reduce the NPH at night, but I do not feel other changes are needed for now - I advised her to: Patient Instructions   Please continue:   - Metformin 1000 mg 2x a day    Please decrease: Insulin Before breakfast Before lunch Before dinner At bedtime  Regular  20 units with a small meal  25 units with a larger meal  5 units with a smaller meal  10 units with a larger meal  20 units with a smaller meal  25 units with a larger meal   NPH 30 x x 25   Please return in 3-4 months with your sugar log.  - today, HbA1c is 7% (better) - continue checking sugars at different times of the day - check 3x a day, rotating checks - advised for yearly eye exams >> she is UTD - Return to clinic in 3-4 mo with sugar log   2. PN -Due to  diabetes -Continues to have tingling and pain at night -Came off amitriptyline due to brain fog  -Continues on Neurontin -Suggested to add MagniLife cream  3. HL - Reviewed latest lipid panel from 11/2015, all fractions at goal: Lab Results  Component Value Date   CHOL 166 11/30/2015   HDL 57.10 11/30/2015   LDLCALC 80 11/30/2015   TRIG 145.0 11/30/2015   CHOLHDL 3 11/30/2015  - Continues the statin (Mevacor) without side effects. - We will check a lipid panel today   4.  Obesity class III  -Weight is slightly lower at this visit, possibly due to Trulicity - at last visit, she inquired about gastric bypass and I encouraged her to consider this.  We discussed about her gastric sleeve surgery and I gave her a handout about it.  We discussed about seeing [facility] for an initial appointment to discuss about surgery.  She did not have this done yet.  Office Visit on 07/28/2017  Component Date Value Ref Range Status  . Cholesterol 07/28/2017 172  0 - 200 mg/dL Final   ATP III Classification       Desirable:  < 200 mg/dL               Borderline High:  200 - 239 mg/dL          High:  > = 240 mg/dL  . Triglycerides 07/28/2017 171.0* 0.0 - 149.0 mg/dL Final   Normal:  <150 mg/dLBorderline High:  150 - 199 mg/dL  . HDL 07/28/2017 46.40  >39.00 mg/dL Final  . VLDL 07/28/2017 34.2  0.0 - 40.0 mg/dL Final  . LDL Cholesterol 07/28/2017 91  0 - 99 mg/dL Final  . Total CHOL/HDL Ratio 07/28/2017 4   Final                  Men          Women1/2 Average Risk     3.4          3.3Average Risk  5.0          4.42X Average Risk          9.6          7.13X Average Risk          15.0          11.0                      . NonHDL 07/28/2017 125.41   Final   NOTE:  Non-HDL goal should be 30 mg/dL higher than patient's LDL goal (i.e. LDL goal of < 70 mg/dL, would have non-HDL goal of < 100 mg/dL)  . Hemoglobin A1C 07/28/2017 7.0* 4.0 - 5.6 % Final   TG slightly high, but this was not a  fasting sample.  Philemon Kingdom, MD PhD [facility] Endocrinology

## 2017-07-28 NOTE — Addendum Note (Signed)
Addended by: Drucilla Schmidt on: 07/28/2017 04:24 PM   Modules accepted: Orders

## 2017-07-28 NOTE — Patient Instructions (Addendum)
Please continue:   - Metformin 1000 mg 2x a day    Please decrease: Insulin Before breakfast Before lunch Before dinner At bedtime  Regular  20 units with a small meal  25 units with a larger meal  5 units with a smaller meal  10 units with a larger meal  20 units with a smaller meal  25 units with a larger meal   NPH 30 x x 25   Please return in 3-4 months with your sugar log.

## 2017-07-31 ENCOUNTER — Ambulatory Visit (INDEPENDENT_AMBULATORY_CARE_PROVIDER_SITE_OTHER): Payer: Medicare Other | Admitting: Orthopedic Surgery

## 2017-09-08 ENCOUNTER — Other Ambulatory Visit: Payer: Self-pay | Admitting: *Deleted

## 2017-09-08 MED ORDER — LOVASTATIN 20 MG PO TABS: 20.0000 mg | ORAL_TABLET | Freq: Every day | ORAL | 0 refills | Status: DC

## 2017-09-08 MED ORDER — LOSARTAN POTASSIUM 25 MG PO TABS: 25.0000 mg | ORAL_TABLET | Freq: Every day | ORAL | 0 refills | Status: DC

## 2017-09-08 NOTE — Telephone Encounter (Signed)
Rx done. 

## 2017-09-22 ENCOUNTER — Inpatient Hospital Stay: Payer: Medicare Other

## 2017-09-22 ENCOUNTER — Inpatient Hospital Stay: Payer: Medicare Other | Attending: Hematology and Oncology | Admitting: Oncology

## 2017-09-22 ENCOUNTER — Telehealth: Payer: Self-pay

## 2017-09-22 VITALS — BP 128/50 | HR 80 | Temp 98.3°F | Resp 18 | Ht 64.0 in | Wt 234.6 lb

## 2017-09-22 DIAGNOSIS — B373 Candidiasis of vulva and vagina: Secondary | ICD-10-CM

## 2017-09-22 DIAGNOSIS — D508 Other iron deficiency anemias: Secondary | ICD-10-CM

## 2017-09-22 DIAGNOSIS — D509 Iron deficiency anemia, unspecified: Secondary | ICD-10-CM | POA: Diagnosis not present

## 2017-09-22 DIAGNOSIS — I10 Essential (primary) hypertension: Secondary | ICD-10-CM | POA: Diagnosis not present

## 2017-09-22 DIAGNOSIS — E119 Type 2 diabetes mellitus without complications: Secondary | ICD-10-CM | POA: Diagnosis not present

## 2017-09-22 LAB — URINALYSIS, COMPLETE (UACMP) WITH MICROSCOPIC
Bacteria, UA: NONE SEEN
Bilirubin Urine: NEGATIVE
Glucose, UA: NEGATIVE mg/dL
Ketones, ur: NEGATIVE mg/dL
Nitrite: NEGATIVE
Protein, ur: NEGATIVE mg/dL
Specific Gravity, Urine: 1.012 (ref 1.005–1.030)
pH: 5 (ref 5.0–8.0)

## 2017-09-22 LAB — CBC WITH DIFFERENTIAL (CANCER CENTER ONLY)
Basophils Absolute: 0 10*3/uL (ref 0.0–0.1)
Basophils Relative: 0 %
Eosinophils Absolute: 0.3 10*3/uL (ref 0.0–0.5)
Eosinophils Relative: 3 %
HCT: 39.1 % (ref 34.8–46.6)
Hemoglobin: 12.5 g/dL (ref 11.6–15.9)
Lymphocytes Relative: 24 %
Lymphs Abs: 2.3 10*3/uL (ref 0.9–3.3)
MCH: 27.7 pg (ref 25.1–34.0)
MCHC: 32 g/dL (ref 31.5–36.0)
MCV: 86.7 fL (ref 79.5–101.0)
Monocytes Absolute: 0.4 10*3/uL (ref 0.1–0.9)
Monocytes Relative: 4 %
Neutro Abs: 6.7 10*3/uL — ABNORMAL HIGH (ref 1.5–6.5)
Neutrophils Relative %: 69 %
Platelet Count: 351 10*3/uL (ref 145–400)
RBC: 4.51 MIL/uL (ref 3.70–5.45)
RDW: 13.9 % (ref 11.2–14.5)
WBC Count: 9.7 10*3/uL (ref 3.9–10.3)

## 2017-09-22 LAB — IRON AND TIBC
Iron: 49 ug/dL (ref 41–142)
Saturation Ratios: 15 % — ABNORMAL LOW (ref 21–57)
TIBC: 325 ug/dL (ref 236–444)
UIBC: 276 ug/dL

## 2017-09-22 LAB — FERRITIN: Ferritin: 96 ng/mL (ref 11–307)

## 2017-09-22 NOTE — Telephone Encounter (Signed)
Printed avs and calender of upcoming appointment. Per 9/13

## 2017-09-22 NOTE — Progress Notes (Signed)
  [facility] OFFICE PROGRESS NOTE   Diagnosis: Iron deficiency anemia  INTERVAL HISTORY:   Ms. Marchesi returns as scheduled.  No bleeding.  She is taking iron once daily.  She reports vaginal itching similar to when she has experienced yeast infections in the past.  No hematuria.  She has dyspnea when lying down.  She has intermittent wheezing.  Objective:  Vital signs in last 24 hours:  Blood pressure (!) 128/50, pulse 80, temperature 98.3 F (36.8 C), temperature source Oral, resp. rate 18, height 5\' 4"  (1.626 m), weight 234 lb 9.6 oz (106.4 kg), SpO2 97 %.    Resp: Lungs clear bilaterally, no respiratory distress Cardio: Regular rate and rhythm GI: No mass, nontender, no hepatosplenomegaly Vascular: No leg edema   Lab Results:  Lab Results  Component Value Date   WBC 9.7 09/22/2017   HGB 12.5 09/22/2017   HCT 39.1 09/22/2017   MCV 86.7 09/22/2017   PLT 351 09/22/2017   NEUTROABS 6.7 (H) 09/22/2017  Urinalysis- small hemoglobin, trace leukocytes, 0-5 red cells, 0-5 white cells, no bacteria seen  Ferritin-96  Medications: I have reviewed the patient's current medications.   Assessment/Plan:  1. Anemia secondary to iron deficiency, possible GI blood loss related to gastritis status post IV ferrous gluconate weekly x4 beginning 03/13/2017, single dose of IV ferrous gluconate 05/22/2017.  Hemoglobin/MCV corrected into normal range. 2. Upper endoscopy 02/15/2017-diffuse moderate inflammation characterized by congestion, erythema, friability and granularity in the gastric body, posterior wall of the stomach and gastric antrum.  Biopsy GASTRIC ANTRAL MUCOSA WITH NON-SPECIFIC REACTIVE GASTROPATHY AND FEW SUBEPITHELIAL CRYSTALLINE IRON DEPOSITS, CONSISTENT WITH IRON PILL GASTROPATHY. GASTRIC OXYNTIC MUCOSA WITH PARIETAL CELL HYPERPLASIA AS CAN BE SEEN IN HYPERGASTRINEMIC STATES SUCH AS PPI THERAPY. WARTHIN-STARRY STAIN IS NEGATIVE FOR HELICOBACTER PYLORI 3. Colonoscopy  02/15/2017-6 mm polyp in the transverse colon (TUBULAR ADENOMA. NEGATIVE FOR HIGH GRADE DYSPLASIA OR MALIGNANCY) 4. Diabetes 5. Hypertension   Disposition: The hemoglobin remains in the normal range.  She will continue iron.  She had trace positive hemoglobin on the urine dipstick today.  This is likely benign finding.  We will repeat a urinalysis when she returns for a CBC in 4 months.  She will be scheduled for an 77-month office visit. We prescribed a single dose of Diflucan for the vaginal yeast infection.  15 minutes were spent with the patient today.  The majority of the time was used for counseling and coordination of care.  Betsy Coder, MD  09/22/2017  12:24 PM

## 2017-09-23 ENCOUNTER — Encounter (INDEPENDENT_AMBULATORY_CARE_PROVIDER_SITE_OTHER): Payer: Self-pay

## 2017-09-25 ENCOUNTER — Other Ambulatory Visit: Payer: Self-pay | Admitting: *Deleted

## 2017-09-25 MED ORDER — FLUCONAZOLE 100 MG PO TABS: 100.0000 mg | ORAL_TABLET | Freq: Every day | ORAL | 0 refills | Status: DC

## 2017-09-25 MED ORDER — METOPROLOL SUCCINATE ER 25 MG PO TB24: 25.0000 mg | ORAL_TABLET | Freq: Every day | ORAL | 0 refills | Status: DC

## 2017-09-27 NOTE — Telephone Encounter (Signed)
We were to call in a single dose of diflucan for vaginal yeast infection UA not suggestive of UTI

## 2017-10-05 ENCOUNTER — Other Ambulatory Visit: Payer: Self-pay | Admitting: Internal Medicine

## 2017-10-06 NOTE — Telephone Encounter (Signed)
Outpatient Medication Detail    Disp Refills Start End   metoprolol succinate (TOPROL-XL) 25 MG 24 hr tablet 30 tablet 0 09/25/2017    Sig - Route: Take 1 tablet (25 mg total) by mouth daily. - Oral   Sent to pharmacy as: metoprolol succinate (TOPROL-XL) 25 MG 24 hr tablet   E-Prescribing Status: Receipt confirmed by pharmacy (09/25/2017 4:57 PM EDT)   Pharmacy   [id number] - [place], [place] - [place]

## 2017-10-10 ENCOUNTER — Other Ambulatory Visit: Payer: Self-pay

## 2017-10-10 ENCOUNTER — Telehealth: Payer: Self-pay | Admitting: Internal Medicine

## 2017-10-10 MED ORDER — "INSULIN SYRINGE-NEEDLE U-100 31G X 5/16"" 1 ML MISC": 2 refills | Status: AC

## 2017-10-10 NOTE — Telephone Encounter (Signed)
Pt is stating that the [facility] didn't get the renewal for her pins. She was able to get her insulin but needs her pins. Please advise pt. Ph # [phone]

## 2017-10-10 NOTE — Telephone Encounter (Signed)
Patient notified

## 2017-10-10 NOTE — Telephone Encounter (Signed)
RELION INSULIN SYRINGE 1ML/31G 31G X 5/16" 1 ML MISC 100 each 2 10/05/2017    Sig: USE 2 TIMES A DAY   Sent to pharmacy as: [place] 1ML/31G 31G X 5/16" 1 ML Misc   Notes to Pharmacy: Please consider 90 day supplies to promote better adherence   E-Prescribing Status: Receipt confirmed by pharmacy (10/05/2017 4:57 PM EDT)     This was filled on the 26th and confirmed by pharmacy.

## 2017-10-12 ENCOUNTER — Encounter (INDEPENDENT_AMBULATORY_CARE_PROVIDER_SITE_OTHER): Payer: Self-pay | Admitting: Orthopedic Surgery

## 2017-10-12 ENCOUNTER — Ambulatory Visit (INDEPENDENT_AMBULATORY_CARE_PROVIDER_SITE_OTHER): Payer: Medicare Other

## 2017-10-12 ENCOUNTER — Ambulatory Visit (INDEPENDENT_AMBULATORY_CARE_PROVIDER_SITE_OTHER): Payer: Medicare Other | Admitting: Orthopedic Surgery

## 2017-10-12 ENCOUNTER — Ambulatory Visit: Payer: Medicare Other | Admitting: Internal Medicine

## 2017-10-12 VITALS — Ht 64.0 in | Wt 234.0 lb

## 2017-10-12 DIAGNOSIS — M14672 Charcot's joint, left ankle and foot: Secondary | ICD-10-CM

## 2017-10-12 DIAGNOSIS — B351 Tinea unguium: Secondary | ICD-10-CM

## 2017-10-12 DIAGNOSIS — M7541 Impingement syndrome of right shoulder: Secondary | ICD-10-CM | POA: Diagnosis not present

## 2017-10-12 NOTE — Progress Notes (Signed)
Office Visit Note   [name]           Date of Birth: 07-Nov-1945           MRN: [id number] Visit Date: 10/12/2017              Requested by: Lucretia Kern, DO [address] [place], [place] [address] PCP: Lucretia Kern, DO  Chief Complaint  Patient presents with  . Left Foot - Pain      HPI: Patient is a 72 year old woman who presents with impingement symptoms right shoulder previous injection did not help much.  Patient is undergone previous subacromial decompression with limited relief of her symptoms.  Patient complains of increasing Charcot deformity to the left midfoot.  Assessment & Plan: Visit Diagnoses:  1. Charcot's joint, left ankle and foot   2. Impingement syndrome of right shoulder   3. Onychomycosis     Plan: Patient is given a prescription for biotech for double upright brace extra-depth shoes and custom orthotics to help stabilize the Charcot collapse.  In the meantime she will use her extra-depth shoes and over-the-counter orthotics she obtained from [employer].  Follow-Up Instructions: Return in about 4 weeks (around 11/09/2017).   Ortho Exam  Patient is alert, oriented, no adenopathy, well-dressed, normal affect, normal respiratory effort. Examination patient has a strong dorsalis pedis pulse there is no redness no cellulitis there is deformity across the Lisfranc complex of the left foot no deformity in the right foot.  No clinical signs of active Charcot process.  Radiographs shows chronic destructive changes across the Lisfranc complex.  There are no plantar ulcers.  Patient does have onychomycotic nails x4 and the nails were trimmed x4 without complications.  Imaging: Xr Foot Complete Left  Result Date: 10/12/2017 3 view radiographs of the left foot shows Charcot collapse across the Lisfranc joint with widening of the Lisfranc complex subchondral cystic changes no significant rocker-bottom deformity.  No images are attached to  the encounter.  Labs: Lab Results  Component Value Date   HGBA1C 7.0 (A) 07/28/2017   HGBA1C 7.4 (H) 03/29/2017   HGBA1C 7.3 12/26/2016   ESRSEDRATE 36 (H) 02/24/2017   CRP 1.2 (H) 02/24/2017   LABURIC 5.5 09/01/2015   LABORGA GROUP B STREP (S.AGALACTIAE) ISOLATED 12/26/2014     Lab Results  Component Value Date   ALBUMIN 3.8 05/19/2017   ALBUMIN 3.6 04/21/2017   ALBUMIN 3.6 02/24/2017   PREALBUMIN 19.4 03/05/2012   LABURIC 5.5 09/01/2015    Body mass index is 40.17 kg/m.  Orders:  Orders Placed This Encounter  Procedures  . XR Foot Complete Left   No orders of the defined types were placed in this encounter.    Procedures: No procedures performed  Clinical Data: No additional findings.  ROS:  All other systems negative, except as noted in the HPI. Review of Systems  Objective: Vital Signs: Ht 5\' 4"  (1.626 m)   Wt 234 lb (106.1 kg)   BMI 40.17 kg/m   Specialty Comments:  No specialty comments available.  PMFS History: Patient Active Problem List   Diagnosis Date Noted  . Impingement syndrome of right shoulder 07/06/2017  . Iron deficiency anemia 05/11/2017  . Obesity, Class III, BMI 40-49.9 (morbid obesity) ([place]) 04/24/2017  . Acute midline low back pain without sciatica 04/11/2017  . Subacute osteomyelitis, right ankle and foot ([place])   . Anemia 02/24/2017  . Right foot ulcer, limited to breakdown of skin ([place]) 09/26/2016  .  Osteomyelitis of toe of left foot ([place])   . Baker's cyst 07/10/2016  . Tachycardia 03/04/2016  . Onychomycosis 12/07/2015  . Non-pressure chronic ulcer of other part of right foot limited to breakdown of skin ([place]) 12/07/2015  . Upper airway cough syndrome 03/05/2014  . History of amputation of lesser toe of right foot ([place]) 04/15/2013  . Acute pain of right shoulder 04/15/2013  . Essential hypertension 12/05/2006  . Uncontrolled type 2 diabetes mellitus with peripheral circulatory disorder ([place]) 10/12/2006  .  Hyperlipidemia 10/12/2006  . Allergic rhinitis 10/12/2006  . GERD - Followed by Dr. Fuller Plan 10/12/2006  . IRRITABLE BOWEL SYNDROME - followed by Dr. Fuller Plan 10/12/2006  . Peripheral neuropathy 10/11/2006  . ALOPECIA NEC 10/11/2006   Past Medical History:  Diagnosis Date  . Alopecia   . Anemia, mild   . Arthritis   . Chronic cough    sees pulmonologist  . Chronic pain    chest wall and abd - s/p extensive eval  . Diabetes mellitus with neuropathy ([place])    sees endocrine  . Diverticulosis   . Fatty liver   . GERD (gastroesophageal reflux disease)    takes Nexium bid, hx erosive esophagitis  . Headache(784.0)    occasionally;r/t sinus   . History of colon polyps   . HTN (hypertension)   . Hx of amputation of lesser toe ([place])    sees podiatrist  . Hyperlipemia   . IBS (irritable bowel syndrome)   . Insomnia    takes Elavil nightly  . Joint pain   . Neuropathy   . Osteomyelitis ([place])   . Pneumonia   . PONV (postoperative nausea and vomiting)   . Seasonal allergies    takes Zyrtec daily  . Sinus tachycardia     Family History  Problem Relation Age of Onset  . Lung cancer Mother 77       smoked heavily  . Emphysema Mother   . Hypertension Father   . Hyperlipidemia Father   . Diabetes Father   . Coronary artery disease Father   . Dementia Father   . COPD Father        smoked  . Stomach cancer Paternal Aunt   . Brain cancer Paternal Uncle   . Irritable bowel syndrome Unknown        Several family members on fathers side   . Diabetes Other   . Stomach cancer Maternal Aunt   . Lung cancer Paternal Uncle   . Heart disease Paternal Uncle   . Colon cancer Neg Hx     Past Surgical History:  Procedure Laterality Date  . AMPUTATION  12/28/2011   Procedure: AMPUTATION DIGIT;  Surgeon: Newt Minion, MD;  Location: [facility];  Service: Orthopedics;  Laterality: Right;  Right Foot 2nd Toe Amputation at MTP (metatarsophalangeal joint)  . AMPUTATION Right 04/25/2012   Procedure:  Right Foot 3rd Toe Amputation;  Surgeon: Newt Minion, MD;  Location: [facility];  Service: Orthopedics;  Laterality: Right;  Right Foot Third Toe Amputation   . AMPUTATION Right 07/27/2012   Procedure: Right 4th Toe Amputation at Metatarsophalangeal;  Surgeon: Newt Minion, MD;  Location: [facility];  Service: Orthopedics;  Laterality: Right;  Right 4th Toe Amputation at Metatarsophalangeal  . AMPUTATION Left 07/15/2016   Procedure: Left 2nd Ray Amputation;  Surgeon: Newt Minion, MD;  Location: [facility];  Service: Orthopedics;  Laterality: Left;  . AMPUTATION Left 11/18/2016   Procedure: Left 3rd and 4th Ray Amputation;  Surgeon: Newt Minion, MD;  Location: [facility];  Service: Orthopedics;  Laterality: Left;  . AMPUTATION Right 03/29/2017   Procedure: RIGHT FOOT 3RD AND 4TH RAY AMPUTATION;  Surgeon: Newt Minion, MD;  Location: [facility];  Service: Orthopedics;  Laterality: Right;  . COLONOSCOPY    . LAPAROSCOPIC APPENDECTOMY  01/05/2011   Procedure: APPENDECTOMY LAPAROSCOPIC;  Surgeon: Pedro Earls, MD;  [facility];  Service: General;  Laterality: N/A;  . OOPHORECTOMY  2001  . ROTATOR CUFF REPAIR Right    x 2  . TUBAL LIGATION    . VAGINAL HYSTERECTOMY  2001   Social History   Occupational History  . Occupation: Retired   Tobacco Use  . Smoking status: Never Smoker  . Smokeless tobacco: Never Used  Substance and Sexual Activity  . Alcohol use: No  . Drug use: No  . Sexual activity: Yes    Birth control/protection: Surgical

## 2017-10-13 ENCOUNTER — Encounter: Payer: Self-pay | Admitting: Internal Medicine

## 2017-10-13 ENCOUNTER — Ambulatory Visit (INDEPENDENT_AMBULATORY_CARE_PROVIDER_SITE_OTHER): Payer: Medicare Other | Admitting: Internal Medicine

## 2017-10-13 VITALS — BP 124/60 | HR 79 | Ht 64.0 in | Wt 231.1 lb

## 2017-10-13 DIAGNOSIS — R0600 Dyspnea, unspecified: Secondary | ICD-10-CM

## 2017-10-13 DIAGNOSIS — E1151 Type 2 diabetes mellitus with diabetic peripheral angiopathy without gangrene: Secondary | ICD-10-CM | POA: Diagnosis not present

## 2017-10-13 DIAGNOSIS — IMO0002 Reserved for concepts with insufficient information to code with codable children: Secondary | ICD-10-CM

## 2017-10-13 DIAGNOSIS — R0609 Other forms of dyspnea: Secondary | ICD-10-CM

## 2017-10-13 DIAGNOSIS — E1165 Type 2 diabetes mellitus with hyperglycemia: Secondary | ICD-10-CM

## 2017-10-13 MED ORDER — METOPROLOL SUCCINATE ER 25 MG PO TB24: 25.0000 mg | ORAL_TABLET | Freq: Every day | ORAL | 3 refills | Status: DC

## 2017-10-13 NOTE — Patient Instructions (Addendum)
Your physician recommends that you continue on your current medications as directed. Please refer to the Current Medication list given to you today.  Your physician has recommended that you have a sleep study. This test records several body functions during sleep, including: brain activity, eye movement, oxygen and carbon dioxide blood levels, heart rate and rhythm, breathing rate and rhythm, the flow of air through your mouth and nose, snoring, body muscle movements, and chest and belly movement.   Your physician wants you to follow-up in: [place].  You will receive a reminder letter in the mail two months in advance. If you don't receive a letter, please call our office to schedule the follow-up appointment.

## 2017-10-13 NOTE — Progress Notes (Addendum)
Cardiology Office Note   Date:  10/13/2017   ID:  Beth Holden, DOB 27-Sep-1945, MRN [id number]  PCP:  Beth Kern, DO  Cardiologist:   Dorris Carnes, MD   Pt referred for CP     History of Present Illness: Beth Holden is a 72 y.o. female with a history of Diabetes GERD, HTN, HL   I saw her Dec 2017 for the firest time for CP   Pain substernal  Breathing helped   Not associated with activity   With cardiac risk factors I recomm a myovue to rulle out ischemia  This was normal     The patient also complained of some palpitations  Holter monitor showed no signif arrhythm     I recomm a trial of a low dose b blocke With cough I recomm she stop there ACE I  Since seen she denies signif CP   No signif SOB except she occasionally wakes up from sleep SOB   Has to sit up     Has been told she snores  Followed in ortho for neuropathy   (Dr Sharol Given)  Outpatient Medications Prior to Visit  Medication Sig Dispense Refill  . acetaminophen (TYLENOL) 500 MG tablet Take 500 mg by mouth every 8 (eight) hours as needed for mild pain.     Marland Kitchen betamethasone dipropionate (DIPROLENE) 0.05 % cream Apply 1 application topically 2 (two) times daily as needed (IRRITATION).    Marland Kitchen cetirizine (ZYRTEC) 10 MG tablet Take 1 tablet (10 mg total) by mouth daily. 90 tablet 1  . cholecalciferol (VITAMIN D) 1000 units tablet Take 1 tablet by mouth daily.     Marland Kitchen FERROUS SULFATE PO Take by mouth.    . Fluocinolone Acetonide (DERMOTIC) 0.01 % OIL Place 1 drop in ear(s) daily as needed (itching).     . gabapentin (NEURONTIN) 100 MG capsule TAKE 2 CAPSULES BY MOUTH AT BEDTIME (Patient taking differently: Take 200-300 mg by mouth at bedtime. TAKE 2-3 CAPSULES BY MOUTH AT BEDTIME) 180 capsule 1  . insulin NPH Human (NOVOLIN N RELION) 100 UNIT/ML injection Inject under skin 25 units in am and 25 units at bedtime (Patient taking differently: Inject 25 Units into the skin 2 (two) times daily before a meal. Inject under skin 25 units  in am and 25 units at bedtime) 20 mL 3  . Insulin Regular Human (NOVOLIN R IJ) Inject 5-25 Units as directed See admin instructions. Pt states she takes 20-25 units at breakfast, 5-10 units at lunch, 20-25 units at dinner and takes a bedtime dose also.    . Insulin Syringe-Needle U-100 (RELION INSULIN SYRINGE 1ML/31G) 31G X 5/16" 1 ML MISC USE 2 TIMES A DAY 100 each 2  . losartan (COZAAR) 25 MG tablet Take 1 tablet (25 mg total) by mouth daily. 90 tablet 0  . lovastatin (MEVACOR) 20 MG tablet Take 1 tablet (20 mg total) by mouth at bedtime. 90 tablet 0  . metFORMIN (GLUCOPHAGE) 1000 MG tablet TAKE 1 TABLET TWICE A DAY WITH A MEAL 180 tablet 1  . metoprolol succinate (TOPROL-XL) 25 MG 24 hr tablet Take 1 tablet (25 mg total) by mouth daily. 30 tablet 0  . naproxen sodium (ANAPROX) 220 MG tablet Take 220 mg by mouth daily as needed (pain).    . NOVOLIN R RELION 100 UNIT/ML injection INJECT 12 TO 30 UNITS INTO THE SKIN 3 TIMES DAILY BEFORE MEALS 30 mL 1  . omeprazole (PRILOSEC) 40 MG capsule Take 80 mg by  mouth 2 (two) times daily.    Marland Kitchen OVER THE COUNTER MEDICATION Apply 1-3 application topically at bedtime. TOPRICIN FOOT CREAM - NEUROPATHY FOOT CREAM **APPLIES TO BOTH FEET AT BEDTIME**    . Probiotic Product (PROBIOTIC DAILY PO) Take 1 capsule by mouth daily.    . psyllium (METAMUCIL) 58.6 % packet Take 1 packet by mouth daily.    . vitamin B-12 (CYANOCOBALAMIN) 1000 MCG tablet Take 1,000 mcg daily by mouth.    . fluconazole (DIFLUCAN) 100 MG tablet Take 1 tablet (100 mg total) by mouth daily. (Patient not taking: Reported on 10/13/2017) 1 tablet 0  . NOVOLIN N RELION 100 UNIT/ML injection INJECT 30 UNITS UNDER THE SKIN IN THE MORNING AND 30 UNITS AT BEDTIME (Patient not taking: Reported on 10/13/2017) 20 mL 11  . silver sulfADIAZINE (SILVADENE) 1 % cream Apply 1 application daily topically. Apply to foot     Facility-Administered Medications Prior to Visit  Medication Dose Route Frequency Provider  Last Rate Last Dose  . 0.9 %  sodium chloride infusion  500 mL Intravenous Once Ladene Artist, MD         Allergies:   Codeine and Propoxyphene hcl   Past Medical History:  Diagnosis Date  . Alopecia   . Anemia, mild   . Arthritis   . Chronic cough    sees pulmonologist  . Chronic pain    chest wall and abd - s/p extensive eval  . Diabetes mellitus with neuropathy ([place])    sees endocrine  . Diverticulosis   . Fatty liver   . GERD (gastroesophageal reflux disease)    takes Nexium bid, hx erosive esophagitis  . Headache(784.0)    occasionally;r/t sinus   . History of colon polyps   . HTN (hypertension)   . Hx of amputation of lesser toe ([place])    sees podiatrist  . Hyperlipemia   . IBS (irritable bowel syndrome)   . Insomnia    takes Elavil nightly  . Joint pain   . Neuropathy   . Osteomyelitis ([place])   . Pneumonia   . PONV (postoperative nausea and vomiting)   . Seasonal allergies    takes Zyrtec daily  . Sinus tachycardia     Past Surgical History:  Procedure Laterality Date  . AMPUTATION  12/28/2011   Procedure: AMPUTATION DIGIT;  Surgeon: Newt Minion, MD;  Location: [facility];  Service: Orthopedics;  Laterality: Right;  Right Foot 2nd Toe Amputation at MTP (metatarsophalangeal joint)  . AMPUTATION Right 04/25/2012   Procedure: Right Foot 3rd Toe Amputation;  Surgeon: Newt Minion, MD;  Location: [facility];  Service: Orthopedics;  Laterality: Right;  Right Foot Third Toe Amputation   . AMPUTATION Right 07/27/2012   Procedure: Right 4th Toe Amputation at Metatarsophalangeal;  Surgeon: Newt Minion, MD;  Location: [facility];  Service: Orthopedics;  Laterality: Right;  Right 4th Toe Amputation at Metatarsophalangeal  . AMPUTATION Left 07/15/2016   Procedure: Left 2nd Ray Amputation;  Surgeon: Newt Minion, MD;  Location: [facility];  Service: Orthopedics;  Laterality: Left;  . AMPUTATION Left 11/18/2016   Procedure: Left 3rd and 4th Ray Amputation;  Surgeon: Newt Minion, MD;   Location: [facility];  Service: Orthopedics;  Laterality: Left;  . AMPUTATION Right 03/29/2017   Procedure: RIGHT FOOT 3RD AND 4TH RAY AMPUTATION;  Surgeon: Newt Minion, MD;  Location: [facility];  Service: Orthopedics;  Laterality: Right;  . COLONOSCOPY    . LAPAROSCOPIC APPENDECTOMY  01/05/2011   Procedure: APPENDECTOMY LAPAROSCOPIC;  Surgeon: Pedro Earls, MD;  [facility];  Service: General;  Laterality: N/A;  . OOPHORECTOMY  2001  . ROTATOR CUFF REPAIR Right    x 2  . TUBAL LIGATION    . VAGINAL HYSTERECTOMY  2001     Social History:  The patient  reports that she has never smoked. She has never used smokeless tobacco. She reports that she does not drink alcohol or use drugs.   Family History:  The patient's family history includes Brain cancer in her paternal uncle; COPD in her father; Coronary artery disease in her father; Dementia in her father; Diabetes in her father and other; Emphysema in her mother; Heart disease in her paternal uncle; Hyperlipidemia in her father; Hypertension in her father; Irritable bowel syndrome in her unknown relative; Lung cancer in her paternal uncle; Lung cancer (age of onset: 7) in her mother; Stomach cancer in her maternal aunt and paternal aunt.    ROS:  Please see the history of present illness. All other systems are reviewed and  Negative to the above problem except as noted.    PHYSICAL EXAM: VS:  BP 124/60   Pulse 79   Ht 5\' 4"  (1.626 m)   Wt 231 lb 1.9 oz (104.8 kg)   SpO2 94%   BMI 39.67 kg/m   GEN:  Morbidly obese 72 yo  in no acute distress  HEENT: normal  Neck: no JVD, carotid bruits, or masses Cardiac: RRR; no murmurs, rubs, or gallops,Tr LE   edema  Respiratory:  clear to auscultation bilaterally, normal work of breathing GI: soft, nontender, nondistended, + BS  No hepatomegaly  MS: s/p amputation in toes bilatera    Skin: warm and dry, no rash Neuro:  Strength and sensation are intact Psych: euthymic mood, full  affect   EKG:  EKG is not ordered today.   Lipid Panel    Component Value Date/Time   CHOL 172 07/28/2017 1525   TRIG 171.0 (H) 07/28/2017 1525   HDL 46.40 07/28/2017 1525   CHOLHDL 4 07/28/2017 1525   VLDL 34.2 07/28/2017 1525   LDLCALC 91 07/28/2017 1525      Wt Readings from Last 3 Encounters:  10/13/17 231 lb 1.9 oz (104.8 kg)  10/12/17 234 lb (106.1 kg)  09/22/17 234 lb 9.6 oz (106.4 kg)      ASSESSMENT AND PLAN:  1  CP Currently without complaints    2  SOB   Episodes that osund like PND   Ques if she has sleep apnea Will set up for sleep study    Volume is up mildly   I do not think it should be causing symptoms   Watch salt  3  Palpitations. Not having a problem  4  DM  Long standing with complcations    Current medicines are reviewed at length with the patient today.  The patient does not have concerns regarding medicines.  Signed, Dorris Carnes, MD  10/13/2017 4:11 PM    [facility] [place], [place], [place]  [address] Phone: [phone]; Fax: [phone]

## 2017-10-15 ENCOUNTER — Other Ambulatory Visit: Payer: Self-pay | Admitting: Internal Medicine

## 2017-10-15 ENCOUNTER — Encounter: Payer: Self-pay | Admitting: Family Medicine

## 2017-10-15 ENCOUNTER — Encounter: Payer: Self-pay | Admitting: Internal Medicine

## 2017-10-15 ENCOUNTER — Other Ambulatory Visit: Payer: Self-pay | Admitting: Family Medicine

## 2017-10-16 ENCOUNTER — Other Ambulatory Visit: Payer: Self-pay | Admitting: Family Medicine

## 2017-10-16 ENCOUNTER — Telehealth: Payer: Self-pay | Admitting: *Deleted

## 2017-10-16 DIAGNOSIS — I10 Essential (primary) hypertension: Secondary | ICD-10-CM

## 2017-10-16 DIAGNOSIS — K219 Gastro-esophageal reflux disease without esophagitis: Secondary | ICD-10-CM

## 2017-10-16 DIAGNOSIS — H43811 Vitreous degeneration, right eye: Secondary | ICD-10-CM | POA: Diagnosis not present

## 2017-10-16 DIAGNOSIS — H40003 Preglaucoma, unspecified, bilateral: Secondary | ICD-10-CM | POA: Diagnosis not present

## 2017-10-16 DIAGNOSIS — H2513 Age-related nuclear cataract, bilateral: Secondary | ICD-10-CM | POA: Diagnosis not present

## 2017-10-16 DIAGNOSIS — E113293 Type 2 diabetes mellitus with mild nonproliferative diabetic retinopathy without macular edema, bilateral: Secondary | ICD-10-CM | POA: Diagnosis not present

## 2017-10-16 MED ORDER — METFORMIN HCL 1000 MG PO TABS: ORAL_TABLET | ORAL | 1 refills | Status: DC

## 2017-10-16 MED ORDER — GABAPENTIN 100 MG PO CAPS: ORAL_CAPSULE | ORAL | 1 refills | Status: DC

## 2017-10-16 NOTE — Telephone Encounter (Signed)
HOME SLEEP TEST  Rodman Key, RN  Freada Bergeron, CMA  Cc: Rodman Key, RN        DR. Harrington Challenger HAS RQUESTED THIS PATIENT HAVE A HOME SLEEP STUDY.

## 2017-10-17 NOTE — Telephone Encounter (Signed)
Patient is aware and agreeable to Home Sleep Study through [facility]. Patient is scheduled for 10/30 at 12 p to pick up home sleep kit and meet with Respiratory therapist at [facility]. Patient is aware that if this appointment date and time does not work for them they should contact Artis Delay directly at [phone]. Patient is aware that a sleep packet will be sent from [facility] in week. Patient is agreeable to treatment and thankful for call.

## 2017-10-19 ENCOUNTER — Telehealth: Payer: Self-pay | Admitting: Internal Medicine

## 2017-10-19 ENCOUNTER — Other Ambulatory Visit: Payer: Self-pay

## 2017-10-19 MED ORDER — GLUCOSE BLOOD VI STRP: ORAL_STRIP | 12 refills | Status: DC

## 2017-10-19 MED ORDER — ONETOUCH ULTRA 2 W/DEVICE KIT: PACK | 0 refills | Status: DC

## 2017-10-19 NOTE — Telephone Encounter (Signed)
Chart reviewed, I am unable to located the name of her meter and strips.  LM for patient to call back with the info.

## 2017-10-19 NOTE — Telephone Encounter (Signed)
Pt's meter is called One Touch Ultra 2, and the strips are One Touch Ultra. Needs 90 day prescription.

## 2017-10-19 NOTE — Telephone Encounter (Signed)
Patient is stating she has lost her monitor to test her sugar with. Has not been able to test it since 10/18/17. Please Advise. Ph # [phone] leave detailed message  Test Strips sent to [place], [place] - [address]

## 2017-10-19 NOTE — Telephone Encounter (Signed)
See other encounter.

## 2017-10-19 NOTE — Telephone Encounter (Signed)
Rxs sent

## 2017-10-20 ENCOUNTER — Other Ambulatory Visit: Payer: Self-pay

## 2017-10-20 MED ORDER — METFORMIN HCL 1000 MG PO TABS: ORAL_TABLET | ORAL | 1 refills | Status: DC

## 2017-10-20 MED ORDER — GLUCOSE BLOOD VI STRP: ORAL_STRIP | 2 refills | Status: DC

## 2017-10-20 MED ORDER — ONETOUCH ULTRA 2 W/DEVICE KIT: PACK | 0 refills | Status: DC

## 2017-10-23 NOTE — Telephone Encounter (Signed)
This has been taken care of.

## 2017-10-28 ENCOUNTER — Other Ambulatory Visit: Payer: Self-pay | Admitting: Internal Medicine

## 2017-11-07 ENCOUNTER — Ambulatory Visit (INDEPENDENT_AMBULATORY_CARE_PROVIDER_SITE_OTHER): Payer: Medicare Other

## 2017-11-07 ENCOUNTER — Encounter: Payer: Self-pay | Admitting: Family Medicine

## 2017-11-07 ENCOUNTER — Ambulatory Visit (INDEPENDENT_AMBULATORY_CARE_PROVIDER_SITE_OTHER): Payer: Medicare Other | Admitting: Family Medicine

## 2017-11-07 VITALS — BP 104/60 | HR 76 | Temp 98.3°F | Ht 64.0 in | Wt 231.3 lb

## 2017-11-07 DIAGNOSIS — J989 Respiratory disorder, unspecified: Secondary | ICD-10-CM

## 2017-11-07 DIAGNOSIS — R0600 Dyspnea, unspecified: Secondary | ICD-10-CM

## 2017-11-07 DIAGNOSIS — R05 Cough: Secondary | ICD-10-CM

## 2017-11-07 DIAGNOSIS — R059 Cough, unspecified: Secondary | ICD-10-CM

## 2017-11-07 LAB — POC INFLUENZA A&B (BINAX/QUICKVUE)
Influenza A, POC: NEGATIVE
Influenza B, POC: NEGATIVE

## 2017-11-07 MED ORDER — BENZONATATE 100 MG PO CAPS: 100.0000 mg | ORAL_CAPSULE | Freq: Three times a day (TID) | ORAL | 0 refills | Status: DC | PRN

## 2017-11-07 NOTE — Progress Notes (Signed)
 HPI:  Using dictation device. Unfortunately this device frequently misinterprets words/phrases.   Acute visit for respiratory illness: -started: 5 days ago -symptoms:nasal congestion, sore throat, cough, SOB mild, headaches, some bdody aches, malaise -denies:fever,  NVD, tooth pain -has tried: nothing -sick contacts/travel/risks: no reported flu, strep or tick exposure -Hx of:RAD, DM -keeping grandkids later this week, has not had flu shot, seasonal allergies, hx chronic cough - saw pulmonologist in the past  ROS: See pertinent positives and negatives per HPI.  Past Medical History:  Diagnosis Date  . Alopecia   . Anemia, mild   . Arthritis   . Chronic cough    sees pulmonologist  . Chronic pain    chest wall and abd - s/p extensive eval  . Diabetes mellitus with neuropathy (HCC)    sees endocrine  . Diverticulosis   . Fatty liver   . GERD (gastroesophageal reflux disease)    takes Nexium bid, hx erosive esophagitis  . Headache(784.0)    occasionally;r/t sinus   . History of colon polyps   . HTN (hypertension)   . Hx of amputation of lesser toe (HCC)    sees podiatrist  . Hyperlipemia   . IBS (irritable bowel syndrome)   . Insomnia    takes Elavil nightly  . Joint pain   . Neuropathy   . Osteomyelitis (HCC)   . Pneumonia   . PONV (postoperative nausea and vomiting)   . Seasonal allergies    takes Zyrtec daily  . Sinus tachycardia     Past Surgical History:  Procedure Laterality Date  . AMPUTATION  12/28/2011   Procedure: AMPUTATION DIGIT;  Surgeon: Marcus V Duda, MD;  Location: [facility];  Service: Orthopedics;  Laterality: Right;  Right Foot 2nd Toe Amputation at MTP (metatarsophalangeal joint)  . AMPUTATION Right 04/25/2012   Procedure: Right Foot 3rd Toe Amputation;  Surgeon: Marcus V Duda, MD;  Location: [facility];  Service: Orthopedics;  Laterality: Right;  Right Foot Third Toe Amputation   . AMPUTATION Right 07/27/2012   Procedure: Right 4th Toe Amputation at  Metatarsophalangeal;  Surgeon: Marcus V Duda, MD;  Location: [facility];  Service: Orthopedics;  Laterality: Right;  Right 4th Toe Amputation at Metatarsophalangeal  . AMPUTATION Left 07/15/2016   Procedure: Left 2nd Ray Amputation;  Surgeon: Duda, Marcus V, MD;  Location: [facility];  Service: Orthopedics;  Laterality: Left;  . AMPUTATION Left 11/18/2016   Procedure: Left 3rd and 4th Ray Amputation;  Surgeon: Duda, Marcus V, MD;  Location: [facility];  Service: Orthopedics;  Laterality: Left;  . AMPUTATION Right 03/29/2017   Procedure: RIGHT FOOT 3RD AND 4TH RAY AMPUTATION;  Surgeon: Duda, Marcus V, MD;  Location: [facility];  Service: Orthopedics;  Laterality: Right;  . COLONOSCOPY    . LAPAROSCOPIC APPENDECTOMY  01/05/2011   Procedure: APPENDECTOMY LAPAROSCOPIC;  Surgeon: Matthew B Martin, MD;  Location: [facility];  Service: General;  Laterality: N/A;  . OOPHORECTOMY  2001  . ROTATOR CUFF REPAIR Right    x 2  . TUBAL LIGATION    . VAGINAL HYSTERECTOMY  2001    Family History  Problem Relation Age of Onset  . Lung cancer Mother 66       smoked heavily  . Emphysema Mother   . Hypertension Father   . Hyperlipidemia Father   . Diabetes Father   . Coronary artery disease Father   . Dementia Father   . COPD Father        smoked  .   Stomach cancer Paternal Aunt   . Brain cancer Paternal Uncle   . Irritable bowel syndrome Unknown        Several family members on fathers side   . Diabetes Other   . Stomach cancer Maternal Aunt   . Lung cancer Paternal Uncle   . Heart disease Paternal Uncle   . Colon cancer Neg Hx     Social History   Socioeconomic History  . Marital status: Married    Spouse name: Not on file  . Number of children: 2  . Years of education: Not on file  . Highest education level: Not on file  Occupational History  . Occupation: Retired   Social Needs  . Financial resource strain: Not on file  . Food insecurity:    Worry: Not on file    Inability: Not on file  . Transportation  needs:    Medical: Not on file    Non-medical: Not on file  Tobacco Use  . Smoking status: Never Smoker  . Smokeless tobacco: Never Used  Substance and Sexual Activity  . Alcohol use: No  . Drug use: No  . Sexual activity: Yes    Birth control/protection: Surgical  Lifestyle  . Physical activity:    Days per week: Not on file    Minutes per session: Not on file  . Stress: Not on file  Relationships  . Social connections:    Talks on phone: Not on file    Gets together: Not on file    Attends religious service: Not on file    Active member of club or organization: Not on file    Attends meetings of clubs or organizations: Not on file    Relationship status: Not on file  Other Topics Concern  . Not on file  Social History Narrative   Caffeine daily    [employer], [employer] no diploma   Married '66   1 dtr- '78; 1 son '71; 2 grandchildren   Occupation: retired 04   Dad with alzheimers-had to place in [place] (summer '10)           Current Outpatient Medications:  .  acetaminophen (TYLENOL) 500 MG tablet, Take 500 mg by mouth every 8 (eight) hours as needed for mild pain. , Disp: , Rfl:  .  betamethasone dipropionate (DIPROLENE) 0.05 % cream, Apply 1 application topically 2 (two) times daily as needed (IRRITATION)., Disp: , Rfl:  .  Blood Glucose Monitoring Suppl (ONE TOUCH ULTRA 2) w/Device KIT, Use to check blood sugar, Disp: 1 each, Rfl: 0 .  cetirizine (ZYRTEC) 10 MG tablet, Take 1 tablet (10 mg total) by mouth daily., Disp: 90 tablet, Rfl: 1 .  cholecalciferol (VITAMIN D) 1000 units tablet, Take 1 tablet by mouth daily. , Disp: , Rfl:  .  EQ ALLERGY RELIEF, CETIRIZINE, 10 MG tablet, TAKE 1 TABLET BY MOUTH ONCE DAILY, Disp: 30 tablet, Rfl: 0 .  FERROUS SULFATE PO, Take by mouth., Disp: , Rfl:  .  Fluocinolone Acetonide (DERMOTIC) 0.01 % OIL, Place 1 drop in ear(s) daily as needed (itching). , Disp: , Rfl:  .  gabapentin (NEURONTIN) 100 MG capsule, TAKE 2 CAPSULES BY MOUTH AT BEDTIME,  Disp: 180 capsule, Rfl: 1 .  glucose blood test strip, Use to check blood sugar three times a day, Disp: 300 each, Rfl: 2 .  insulin NPH Human (NOVOLIN N RELION) 100 UNIT/ML injection, Inject under skin 25 units in am and 25 units at bedtime (Patient taking differently:   Inject 25 Units into the skin 2 (two) times daily before a meal. Inject under skin 25 units in am and 25 units at bedtime), Disp: 20 mL, Rfl: 3 .  Insulin Regular Human (NOVOLIN R IJ), Inject 5-25 Units as directed See admin instructions. Pt states she takes 20-25 units at breakfast, 5-10 units at lunch, 20-25 units at dinner and takes a bedtime dose also., Disp: , Rfl:  .  Insulin Syringe-Needle U-100 (RELION INSULIN SYRINGE 1ML/31G) 31G X 5/16" 1 ML MISC, USE 2 TIMES A DAY, Disp: 100 each, Rfl: 2 .  losartan (COZAAR) 25 MG tablet, TAKE 1 TABLET DAILY.       PLEASE MAKE AN APPOINTMENT WITH YOUR DOCTOR, Disp: 30 tablet, Rfl: 0 .  lovastatin (MEVACOR) 20 MG tablet, TAKE 1 TABLET AT BEDTIME.  PLEASE MAKE AN APPOINTMENT WITH YOUR DOCTOR, Disp: 90 tablet, Rfl: 0 .  metFORMIN (GLUCOPHAGE) 1000 MG tablet, TAKE 1 TABLET TWICE A DAY WITH A MEAL, Disp: 60 tablet, Rfl: 1 .  metoprolol succinate (TOPROL-XL) 25 MG 24 hr tablet, TAKE 1 TABLET BY MOUTH EVERY DAY, Disp: 90 tablet, Rfl: 3 .  naproxen sodium (ANAPROX) 220 MG tablet, Take 220 mg by mouth daily as needed (pain)., Disp: , Rfl:  .  NOVOLIN R RELION 100 UNIT/ML injection, INJECT 12 TO 30 UNITS INTO THE SKIN 3 TIMES DAILY BEFORE MEALS, Disp: 30 mL, Rfl: 1 .  omeprazole (PRILOSEC) 40 MG capsule, Take 80 mg by mouth 2 (two) times daily., Disp: , Rfl:  .  OVER THE COUNTER MEDICATION, Apply 1-3 application topically at bedtime. TOPRICIN FOOT CREAM - NEUROPATHY FOOT CREAM **APPLIES TO BOTH FEET AT BEDTIME**, Disp: , Rfl:  .  Probiotic Product (PROBIOTIC DAILY PO), Take 1 capsule by mouth daily., Disp: , Rfl:  .  psyllium (METAMUCIL) 58.6 % packet, Take 1 packet by mouth daily., Disp: , Rfl:  .   vitamin B-12 (CYANOCOBALAMIN) 1000 MCG tablet, Take 1,000 mcg daily by mouth., Disp: , Rfl:  .  benzonatate (TESSALON PERLES) 100 MG capsule, Take 1 capsule (100 mg total) by mouth 3 (three) times daily as needed., Disp: 20 capsule, Rfl: 0  Current Facility-Administered Medications:  .  0.9 %  sodium chloride infusion, 500 mL, Intravenous, Once, Stark, Malcolm T, MD  EXAM:  Vitals:   11/07/17 1424  BP: 104/60  Pulse: 76  Temp: 98.3 F (36.8 C)  SpO2: 96%    Body mass index is 39.7 kg/m.  GENERAL: vitals reviewed and listed above, alert, oriented, appears well hydrated and in no acute distress  HEENT: atraumatic, conjunttiva clear, no obvious abnormalities on inspection of external nose and ears, normal appearance of ear canals and TMs, clear nasal congestion, mild post oropharyngeal erythema with PND, no tonsillar edema or exudate, no sinus TTP  NECK: no obvious masses on inspection  LUNGS: scattered wheeze vs rhonchi  CV: HRRR, no peripheral edema  MS: moves all extremities without noticeable abnormality  PSYCH: pleasant and cooperative, no obvious depression or anxiety  ASSESSMENT AND PLAN:  Discussed the following assessment and plan:  Respiratory illness - Plan: DG Chest 2 View  Dyspnea, unspecified type - Plan: DG Chest 2 View  Cough - Plan: DG Chest 2 View  -given HPI and exam findings today, a serious infection or illness is unlikely. We discussed potential etiologies, with VURI being most likely, now with bronchitis vs LRI. CXR. Rapid flu test - out of treatment window but she wants to check. Risks benefits prednisone discussed   and opted for short course/low dose. abx if CXR suggest LRI/CAP. Follow up 3 weeks and flu shot then. Tessalon for cough - discussed risks.We discussed treatment side effects, likely course, antibiotic misuse, transmission, and signs of developing a serious illness. -of course, we advised to return or notify a doctor immediately if symptoms  worsen or persist or new concerns arise.    Patient Instructions  BEFORE YOU LEAVE: -flu test -CXR -follow up: reschedule physical for 3 weeks from now  Take the prednisone for 4 days as instructed: -56m x 2 days -the 221mx 2 days  Tessalon for cough if needed. KEEP away from children and follow instructions carefully.  I hope you are feeling better soon! Seek care promptly if your symptoms worsen, new concerns arise or you are not improving with treatment.   Prednisone tablets What is this medicine? PREDNISONE (PRED ni sone) is a corticosteroid. It is commonly used to treat inflammation of the skin, joints, lungs, and other organs. Common conditions treated include asthma, allergies, and arthritis. It is also used for other conditions, such as blood disorders and diseases of the adrenal glands. This medicine may be used for other purposes; ask your health care provider or pharmacist if you have questions. COMMON BRAND NAME(S): Deltasone, Predone, Sterapred, Sterapred DS What should I tell my health care provider before I take this medicine? They need to know if you have any of these conditions: -Cushing's syndrome -diabetes -glaucoma -heart disease -high blood pressure -infection (especially a virus infection such as chickenpox, cold sores, or herpes) -kidney disease -liver disease -mental illness -myasthenia gravis -osteoporosis -seizures -stomach or intestine problems -thyroid disease -an unusual or allergic reaction to lactose, prednisone, other medicines, foods, dyes, or preservatives -pregnant or trying to get pregnant -breast-feeding How should I use this medicine? Take this medicine by mouth with a glass of water. Follow the directions on the prescription label. Take this medicine with food. If you are taking this medicine once a day, take it in the morning. Do not take more medicine than you are told to take. Do not suddenly stop taking your medicine because  you may develop a severe reaction. Your doctor will tell you how much medicine to take. If your doctor wants you to stop the medicine, the dose may be slowly lowered over time to avoid any side effects. Talk to your pediatrician regarding the use of this medicine in children. Special care may be needed. Overdosage: If you think you have taken too much of this medicine contact a poison control center or emergency room at once. NOTE: This medicine is only for you. Do not share this medicine with others. What if I miss a dose? If you miss a dose, take it as soon as you can. If it is almost time for your next dose, talk to your doctor or health care professional. You may need to miss a dose or take an extra dose. Do not take double or extra doses without advice. What may interact with this medicine? Do not take this medicine with any of the following medications: -metyrapone -mifepristone This medicine may also interact with the following medications: -aminoglutethimide -amphotericin B -aspirin and aspirin-like medicines -barbiturates -certain medicines for diabetes, like glipizide or glyburide -cholestyramine -cholinesterase inhibitors -cyclosporine -digoxin -diuretics -ephedrine -female hormones, like estrogens and birth control pills -isoniazid -ketoconazole -NSAIDS, medicines for pain and inflammation, like ibuprofen or naproxen -phenytoin -rifampin -toxoids -vaccines -warfarin This list may not describe all possible interactions. Give your health  care provider a list of all the medicines, herbs, non-prescription drugs, or dietary supplements you use. Also tell them if you smoke, drink alcohol, or use illegal drugs. Some items may interact with your medicine. What should I watch for while using this medicine? Visit your doctor or health care professional for regular checks on your progress. If you are taking this medicine over a prolonged period, carry an identification card with  your name and address, the type and dose of your medicine, and your doctor's name and address. This medicine may increase your risk of getting an infection. Tell your doctor or health care professional if you are around anyone with measles or chickenpox, or if you develop sores or blisters that do not heal properly. If you are going to have surgery, tell your doctor or health care professional that you have taken this medicine within the last twelve months. Ask your doctor or health care professional about your diet. You may need to lower the amount of salt you eat. This medicine may affect blood sugar levels. If you have diabetes, check with your doctor or health care professional before you change your diet or the dose of your diabetic medicine. What side effects may I notice from receiving this medicine? Side effects that you should report to your doctor or health care professional as soon as possible: -allergic reactions like skin rash, itching or hives, swelling of the face, lips, or tongue -changes in emotions or moods -changes in vision -depressed mood -eye pain -fever or chills, cough, sore throat, pain or difficulty passing urine -increased thirst -swelling of ankles, feet Side effects that usually do not require medical attention (report to your doctor or health care professional if they continue or are bothersome): -confusion, excitement, restlessness -headache -nausea, vomiting -skin problems, acne, thin and shiny skin -trouble sleeping -weight gain This list may not describe all possible side effects. Call your doctor for medical advice about side effects. You may report side effects to FDA at 1-800-FDA-1088. Where should I keep my medicine? Keep out of the reach of children. Store at room temperature between 15 and 30 degrees C (59 and 86 degrees F). Protect from light. Keep container tightly closed. Throw away any unused medicine after the expiration date. NOTE: This sheet is  a summary. It may not cover all possible information. If you have questions about this medicine, talk to your doctor, pharmacist, or health care provider.  2018 Elsevier/Gold Standard (2010-08-12 10:57:14)   Benzonatate capsules What is this medicine? BENZONATATE (ben ZOE na tate) is used to treat cough. This medicine may be used for other purposes; ask your health care provider or pharmacist if you have questions. COMMON BRAND NAME(S): Tessalon Perles, Zonatuss What should I tell my health care provider before I take this medicine? They need to know if you have any of these conditions: -kidney or liver disease -an unusual or allergic reaction to benzonatate, anesthetics, other medicines, foods, dyes, or preservatives -pregnant or trying to get pregnant -breast-feeding How should I use this medicine? Take this medicine by mouth with a glass of water. Follow the directions on the prescription label. Avoid breaking, chewing, or sucking the capsule, as this can cause serious side effects. Take your medicine at regular intervals. Do not take your medicine more often than directed. Talk to your pediatrician regarding the use of this medicine in children. While this drug may be prescribed for children as young as 22 years old for selected conditions, precautions do apply. Overdosage:  If you think you have taken too much of this medicine contact a poison control center or emergency room at once. NOTE: This medicine is only for you. Do not share this medicine with others. What if I miss a dose? If you miss a dose, take it as soon as you can. If it is almost time for your next dose, take only that dose. Do not take double or extra doses. What may interact with this medicine? Do not take this medicine with any of the following medications: -MAOIs like Carbex, Eldepryl, Marplan, Nardil, and Parnate This list may not describe all possible interactions. Give your health care provider a list of all the  medicines, herbs, non-prescription drugs, or dietary supplements you use. Also tell them if you smoke, drink alcohol, or use illegal drugs. Some items may interact with your medicine. What should I watch for while using this medicine? Tell your doctor if your symptoms do not improve or if they get worse. If you have a high fever, skin rash, or headache, see your health care professional. You may get drowsy or dizzy. Do not drive, use machinery, or do anything that needs mental alertness until you know how this medicine affects you. Do not sit or stand up quickly, especially if you are an older patient. This reduces the risk of dizzy or fainting spells. What side effects may I notice from receiving this medicine? Side effects that you should report to your doctor or health care professional as soon as possible: -allergic reactions like skin rash, itching or hives, swelling of the face, lips, or tongue -breathing problems -chest pain -confusion or hallucinations -irregular heartbeat -numbness of mouth or throat -seizures Side effects that usually do not require medical attention (report to your doctor or health care professional if they continue or are bothersome): -burning feeling in the eyes -constipation -headache -nasal congestion -stomach upset This list may not describe all possible side effects. Call your doctor for medical advice about side effects. You may report side effects to FDA at 1-800-FDA-1088. Where should I keep my medicine? Keep out of the reach of children. Store at room temperature between 15 and 30 degrees C (59 and 86 degrees F). Keep tightly closed. Protect from light and moisture. Throw away any unused medicine after the expiration date. NOTE: This sheet is a summary. It may not cover all possible information. If you have questions about this medicine, talk to your doctor, pharmacist, or health care provider.  2018 Elsevier/Gold Standard (2007-03-28  14:52:56)     R , DO   

## 2017-11-07 NOTE — Addendum Note (Signed)
Addended by: Agnes Lawrence on: 11/07/2017 03:20 PM   Modules accepted: Orders

## 2017-11-07 NOTE — Patient Instructions (Addendum)
BEFORE YOU LEAVE: -flu test -CXR -follow up: reschedule physical for 3 weeks from now  Take the prednisone for 4 days as instructed: -40mg  x 2 days -the 20mg  x 2 days  Tessalon for cough if needed. KEEP away from children and follow instructions carefully.  I hope you are feeling better soon! Seek care promptly if your symptoms worsen, new concerns arise or you are not improving with treatment.   Prednisone tablets What is this medicine? PREDNISONE (PRED ni sone) is a corticosteroid. It is commonly used to treat inflammation of the skin, joints, lungs, and other organs. Common conditions treated include asthma, allergies, and arthritis. It is also used for other conditions, such as blood disorders and diseases of the adrenal glands. This medicine may be used for other purposes; ask your health care provider or pharmacist if you have questions. COMMON BRAND NAME(S): Deltasone, Predone, Sterapred, Sterapred DS What should I tell my health care provider before I take this medicine? They need to know if you have any of these conditions: -Cushing's syndrome -diabetes -glaucoma -heart disease -high blood pressure -infection (especially a virus infection such as chickenpox, cold sores, or herpes) -kidney disease -liver disease -mental illness -myasthenia gravis -osteoporosis -seizures -stomach or intestine problems -thyroid disease -an unusual or allergic reaction to lactose, prednisone, other medicines, foods, dyes, or preservatives -pregnant or trying to get pregnant -breast-feeding How should I use this medicine? Take this medicine by mouth with a glass of water. Follow the directions on the prescription label. Take this medicine with food. If you are taking this medicine once a day, take it in the morning. Do not take more medicine than you are told to take. Do not suddenly stop taking your medicine because you may develop a severe reaction. Your doctor will tell you how much  medicine to take. If your doctor wants you to stop the medicine, the dose may be slowly lowered over time to avoid any side effects. Talk to your pediatrician regarding the use of this medicine in children. Special care may be needed. Overdosage: If you think you have taken too much of this medicine contact a poison control center or emergency room at once. NOTE: This medicine is only for you. Do not share this medicine with others. What if I miss a dose? If you miss a dose, take it as soon as you can. If it is almost time for your next dose, talk to your doctor or health care professional. You may need to miss a dose or take an extra dose. Do not take double or extra doses without advice. What may interact with this medicine? Do not take this medicine with any of the following medications: -metyrapone -mifepristone This medicine may also interact with the following medications: -aminoglutethimide -amphotericin B -aspirin and aspirin-like medicines -barbiturates -certain medicines for diabetes, like glipizide or glyburide -cholestyramine -cholinesterase inhibitors -cyclosporine -digoxin -diuretics -ephedrine -female hormones, like estrogens and birth control pills -isoniazid -ketoconazole -NSAIDS, medicines for pain and inflammation, like ibuprofen or naproxen -phenytoin -rifampin -toxoids -vaccines -warfarin This list may not describe all possible interactions. Give your health care provider a list of all the medicines, herbs, non-prescription drugs, or dietary supplements you use. Also tell them if you smoke, drink alcohol, or use illegal drugs. Some items may interact with your medicine. What should I watch for while using this medicine? Visit your doctor or health care professional for regular checks on your progress. If you are taking this medicine over a prolonged period, carry  an identification card with your name and address, the type and dose of your medicine, and your  doctor's name and address. This medicine may increase your risk of getting an infection. Tell your doctor or health care professional if you are around anyone with measles or chickenpox, or if you develop sores or blisters that do not heal properly. If you are going to have surgery, tell your doctor or health care professional that you have taken this medicine within the last twelve months. Ask your doctor or health care professional about your diet. You may need to lower the amount of salt you eat. This medicine may affect blood sugar levels. If you have diabetes, check with your doctor or health care professional before you change your diet or the dose of your diabetic medicine. What side effects may I notice from receiving this medicine? Side effects that you should report to your doctor or health care professional as soon as possible: -allergic reactions like skin rash, itching or hives, swelling of the face, lips, or tongue -changes in emotions or moods -changes in vision -depressed mood -eye pain -fever or chills, cough, sore throat, pain or difficulty passing urine -increased thirst -swelling of ankles, feet Side effects that usually do not require medical attention (report to your doctor or health care professional if they continue or are bothersome): -confusion, excitement, restlessness -headache -nausea, vomiting -skin problems, acne, thin and shiny skin -trouble sleeping -weight gain This list may not describe all possible side effects. Call your doctor for medical advice about side effects. You may report side effects to FDA at 1-800-FDA-1088. Where should I keep my medicine? Keep out of the reach of children. Store at room temperature between 15 and 30 degrees C (59 and 86 degrees F). Protect from light. Keep container tightly closed. Throw away any unused medicine after the expiration date. NOTE: This sheet is a summary. It may not cover all possible information. If you have  questions about this medicine, talk to your doctor, pharmacist, or health care provider.  2018 [employer] (2010-08-12 10:57:14)   Benzonatate capsules What is this medicine? BENZONATATE (ben ZOE na tate) is used to treat cough. This medicine may be used for other purposes; ask your health care provider or pharmacist if you have questions. COMMON BRAND NAME(S): Tessalon Perles, Zonatuss What should I tell my health care provider before I take this medicine? They need to know if you have any of these conditions: -kidney or liver disease -an unusual or allergic reaction to benzonatate, anesthetics, other medicines, foods, dyes, or preservatives -pregnant or trying to get pregnant -breast-feeding How should I use this medicine? Take this medicine by mouth with a glass of water. Follow the directions on the prescription label. Avoid breaking, chewing, or sucking the capsule, as this can cause serious side effects. Take your medicine at regular intervals. Do not take your medicine more often than directed. Talk to your pediatrician regarding the use of this medicine in children. While this drug may be prescribed for children as young as 46 years old for selected conditions, precautions do apply. Overdosage: If you think you have taken too much of this medicine contact a poison control center or emergency room at once. NOTE: This medicine is only for you. Do not share this medicine with others. What if I miss a dose? If you miss a dose, take it as soon as you can. If it is almost time for your next dose, take only that dose. Do not  take double or extra doses. What may interact with this medicine? Do not take this medicine with any of the following medications: -MAOIs like Carbex, Eldepryl, Marplan, Nardil, and Parnate This list may not describe all possible interactions. Give your health care provider a list of all the medicines, herbs, non-prescription drugs, or dietary supplements  you use. Also tell them if you smoke, drink alcohol, or use illegal drugs. Some items may interact with your medicine. What should I watch for while using this medicine? Tell your doctor if your symptoms do not improve or if they get worse. If you have a high fever, skin rash, or headache, see your health care professional. You may get drowsy or dizzy. Do not drive, use machinery, or do anything that needs mental alertness until you know how this medicine affects you. Do not sit or stand up quickly, especially if you are an older patient. This reduces the risk of dizzy or fainting spells. What side effects may I notice from receiving this medicine? Side effects that you should report to your doctor or health care professional as soon as possible: -allergic reactions like skin rash, itching or hives, swelling of the face, lips, or tongue -breathing problems -chest pain -confusion or hallucinations -irregular heartbeat -numbness of mouth or throat -seizures Side effects that usually do not require medical attention (report to your doctor or health care professional if they continue or are bothersome): -burning feeling in the eyes -constipation -headache -nasal congestion -stomach upset This list may not describe all possible side effects. Call your doctor for medical advice about side effects. You may report side effects to FDA at 1-800-FDA-1088. Where should I keep my medicine? Keep out of the reach of children. Store at room temperature between 15 and 30 degrees C (59 and 86 degrees F). Keep tightly closed. Protect from light and moisture. Throw away any unused medicine after the expiration date. NOTE: This sheet is a summary. It may not cover all possible information. If you have questions about this medicine, talk to your doctor, pharmacist, or health care provider.  2018 [employer] Standard (2007-03-28 14:52:56)

## 2017-11-08 ENCOUNTER — Encounter (HOSPITAL_BASED_OUTPATIENT_CLINIC_OR_DEPARTMENT_OTHER): Payer: Medicare Other

## 2017-11-08 ENCOUNTER — Other Ambulatory Visit: Payer: Self-pay | Admitting: Family Medicine

## 2017-11-08 MED ORDER — PREDNISONE 20 MG PO TABS: 40.0000 mg | ORAL_TABLET | Freq: Every day | ORAL | 0 refills | Status: DC

## 2017-11-08 NOTE — Telephone Encounter (Signed)
I called the pt and informed her of the message below.  She stated [facility] told her they did not have a Rx for Prednisone?  Message sent to Dr Maudie Mercury.

## 2017-11-09 ENCOUNTER — Other Ambulatory Visit: Payer: Self-pay | Admitting: Family Medicine

## 2017-11-09 ENCOUNTER — Encounter: Payer: Medicare Other | Admitting: Family Medicine

## 2017-11-24 ENCOUNTER — Encounter (HOSPITAL_BASED_OUTPATIENT_CLINIC_OR_DEPARTMENT_OTHER): Payer: Medicare Other

## 2017-11-28 ENCOUNTER — Encounter: Payer: Self-pay | Admitting: Internal Medicine

## 2017-11-28 ENCOUNTER — Ambulatory Visit (INDEPENDENT_AMBULATORY_CARE_PROVIDER_SITE_OTHER): Payer: Medicare Other | Admitting: Internal Medicine

## 2017-11-28 VITALS — BP 126/60 | HR 80 | Ht 64.0 in | Wt 233.0 lb

## 2017-11-28 DIAGNOSIS — E785 Hyperlipidemia, unspecified: Secondary | ICD-10-CM

## 2017-11-28 DIAGNOSIS — G63 Polyneuropathy in diseases classified elsewhere: Secondary | ICD-10-CM

## 2017-11-28 DIAGNOSIS — E1165 Type 2 diabetes mellitus with hyperglycemia: Secondary | ICD-10-CM | POA: Diagnosis not present

## 2017-11-28 DIAGNOSIS — E1151 Type 2 diabetes mellitus with diabetic peripheral angiopathy without gangrene: Secondary | ICD-10-CM

## 2017-11-28 DIAGNOSIS — IMO0002 Reserved for concepts with insufficient information to code with codable children: Secondary | ICD-10-CM

## 2017-11-28 LAB — POCT GLYCOSYLATED HEMOGLOBIN (HGB A1C): Hemoglobin A1C: 6.6 % — AB (ref 4.0–5.6)

## 2017-11-28 NOTE — Progress Notes (Signed)
Subjective:     Patient ID: Beth Holden, female   DOB: [birth year], 72 y.o.   MRN: [id number]  HPI Ms. Astarita is a pleasant 72 y.o. woman returning for f/u for DM2, dx ~2000, uncontrolled, insulin-dependent, with complications (diabetic peripheral neuropathy, multiple toe amputations). Last visit 4 months ago.  At last visit, I noticed that she had Charcot deformity of her left foot.  She saw Dr. Sharol Given since then and is now waiting for special shoes.  She continues to experience low blood sugars especially at night, despite reducing her nighttime NPH at last visit.  Last HbA1C was: Lab Results  Component Value Date   HGBA1C 7.0 (A) 07/28/2017   HGBA1C 7.4 (H) 03/29/2017   HGBA1C 7.3 12/26/2016   She is now on: - Metformin 1000 mg 2x a day  Insulin Before breakfast Before lunch Before dinner At bedtime  Regular  20 units with a small meal  25 units with a larger meal  5 units with a smaller meal  10 units with a larger meal  20 units with a smaller meal  25 units with a larger meal   NPH 30 >> 20-25 x x 25   She also tried: Levemir 47 units in am and 37 units in pm >> $800 for 3 mo supply Januvia 100 mg daily - $450 for 3 mo Invokana 100 mg - $455 for 3 mo Took Byetta before We discussed about starting her on a VGo mechanical pump in the past. She had an appointment with diabetes education but decided not to pursue it.He also tried a sliding scale of regular insulin but she was not using it. We tried Trulicity but she could not tolerate it due to nausea, diarrhea, constipation, weakness.  She checks her sugars 3x a day: - am:  111-221 >>109-206, 254 >> 76-188, 343 (highs after a low at night) - 2h after b'fast :184, 190, 307 >> 111-181 >> 132-242 - prelunch: 110-214 >> n/c >> 70-196 - 2-3h after lunch:   63, 203-332 >> 113-200 >> 110-234, 255 - before dinner:1153, 468 >> 66, 110, 261 >> 61-177 - after dinner:151-204, 451 >> 123-251, 281  >> 148-249 - bedtime: 154, 156,  326 >> 100-171, 215 >> 113-248 - nighttime:60, 97-229, 251 >> 51, 55-152, 179, 282 >> 47-153, 193, 303 Lowest: 47 >> 69 >> 51 >> 47.  Has hypoglycemia awareness in the 70s. Highest:  468 - Prednisone >> 282 >> 405 (Prednisone).  Meals: - Breakfast: egg, bacon, toast; grits; biscuit; fruit; sometimes skips - Lunch: 1/2 PB sandwich or soup and yoghurt, sometimes crackers - Dinner: meat + vegetables + some starch - Snacks: fruit   - No CKD: BUN  Date Value Ref Range Status  05/19/2017 14 7 - 26 mg/dL Final   Creatinine  Date Value Ref Range Status  05/19/2017 0.88 0.60 - 1.10 mg/dL Final  On enalapril.  - + HL; latest lipid panel: Lab Results  Component Value Date   CHOL 172 07/28/2017   HDL 46.40 07/28/2017   LDLCALC 91 07/28/2017   TRIG 171.0 (H) 07/28/2017   CHOLHDL 4 07/28/2017  On Mevacor. - last eye exam in 09/2017: + Mild DR, no glaucoma (Dr. Zadie Rhine). -+ Numbness and tingling in feet-off amitriptyline, continues on Neurontin  Review of Systems Constitutional: no weight gain/no weight loss, no fatigue, no subjective hyperthermia, no subjective hypothermia, + nocturia x4 Eyes: no blurry vision, no xerophthalmia ENT: no sore throat, no nodules palpated in neck, no dysphagia, no  odynophagia, no hoarseness Cardiovascular: no CP/+ SOB/no palpitations/no leg swelling Respiratory: + Cough/+ SOB/+ wheezing Gastrointestinal: no N/no V/no D/no C/no acid reflux Musculoskeletal: no muscle aches/no joint aches Skin: no rashes, no hair loss Neurological: no tremors/+ numbness/+ tingling/no dizziness  I reviewed pt's medications, allergies, PMH, social hx, family hx, and changes were documented in the history of present illness. Otherwise, unchanged from my initial visit note.  Past Medical History:  Diagnosis Date  . Alopecia   . Anemia, mild   . Arthritis   . Chronic cough    sees pulmonologist  . Chronic pain    chest wall and abd - s/p extensive eval  . Diabetes  mellitus with neuropathy ([place])    sees endocrine  . Diverticulosis   . Fatty liver   . GERD (gastroesophageal reflux disease)    takes Nexium bid, hx erosive esophagitis  . Headache(784.0)    occasionally;r/t sinus   . History of colon polyps   . HTN (hypertension)   . Hx of amputation of lesser toe ([place])    sees podiatrist  . Hyperlipemia   . IBS (irritable bowel syndrome)   . Insomnia    takes Elavil nightly  . Joint pain   . Neuropathy   . Osteomyelitis ([place])   . Pneumonia   . PONV (postoperative nausea and vomiting)   . Seasonal allergies    takes Zyrtec daily  . Sinus tachycardia    Past Surgical History:  Procedure Laterality Date  . AMPUTATION  12/28/2011   Procedure: AMPUTATION DIGIT;  Surgeon: Newt Minion, MD;  Location: [facility];  Service: Orthopedics;  Laterality: Right;  Right Foot 2nd Toe Amputation at MTP (metatarsophalangeal joint)  . AMPUTATION Right 04/25/2012   Procedure: Right Foot 3rd Toe Amputation;  Surgeon: Newt Minion, MD;  Location: [facility];  Service: Orthopedics;  Laterality: Right;  Right Foot Third Toe Amputation   . AMPUTATION Right 07/27/2012   Procedure: Right 4th Toe Amputation at Metatarsophalangeal;  Surgeon: Newt Minion, MD;  Location: [facility];  Service: Orthopedics;  Laterality: Right;  Right 4th Toe Amputation at Metatarsophalangeal  . AMPUTATION Left 07/15/2016   Procedure: Left 2nd Ray Amputation;  Surgeon: Newt Minion, MD;  Location: [facility];  Service: Orthopedics;  Laterality: Left;  . AMPUTATION Left 11/18/2016   Procedure: Left 3rd and 4th Ray Amputation;  Surgeon: Newt Minion, MD;  Location: [facility];  Service: Orthopedics;  Laterality: Left;  . AMPUTATION Right 03/29/2017   Procedure: RIGHT FOOT 3RD AND 4TH RAY AMPUTATION;  Surgeon: Newt Minion, MD;  Location: [facility];  Service: Orthopedics;  Laterality: Right;  . COLONOSCOPY    . LAPAROSCOPIC APPENDECTOMY  01/05/2011   Procedure: APPENDECTOMY LAPAROSCOPIC;  Surgeon: Pedro Earls,  MD;  Location: [facility];  Service: General;  Laterality: N/A;  . OOPHORECTOMY  2001  . ROTATOR CUFF REPAIR Right    x 2  . TUBAL LIGATION    . VAGINAL HYSTERECTOMY  2001   Social History   Socioeconomic History  . Marital status: Married    Spouse name: Not on file  . Number of children: 2  . Years of education: Not on file  . Highest education level: Not on file  Occupational History  . Occupation: Retired   Scientific laboratory technician  . Financial resource strain: Not on file  . Food insecurity:    Worry: Not on file    Inability: Not on file  . Transportation needs:  Medical: Not on file    Non-medical: Not on file  Tobacco Use  . Smoking status: Never Smoker  . Smokeless tobacco: Never Used  Substance and Sexual Activity  . Alcohol use: No  . Drug use: No  . Sexual activity: Yes    Birth control/protection: Surgical  Lifestyle  . Physical activity:    Days per week: Not on file    Minutes per session: Not on file  . Stress: Not on file  Relationships  . Social connections:    Talks on phone: Not on file    Gets together: Not on file    Attends religious service: Not on file    Active member of club or organization: Not on file    Attends meetings of clubs or organizations: Not on file    Relationship status: Not on file  . Intimate partner violence:    Fear of current or ex partner: Not on file    Emotionally abused: Not on file    Physically abused: Not on file    Forced sexual activity: Not on file  Other Topics Concern  . Not on file  Social History Narrative   Caffeine daily    [employer], [employer] no diploma   Married '66   1 dtr- '78; 1 son '71; 2 grandchildren   Occupation: retired 04   Dad with alzheimers-had to place in [place] (summer '10)         Current Outpatient Medications on File Prior to Visit  Medication Sig Dispense Refill  . acetaminophen (TYLENOL) 500 MG tablet Take 500 mg by mouth every 8 (eight) hours as needed for mild pain.     . benzonatate (TESSALON  PERLES) 100 MG capsule Take 1 capsule (100 mg total) by mouth 3 (three) times daily as needed. 20 capsule 0  . betamethasone dipropionate (DIPROLENE) 0.05 % cream Apply 1 application topically 2 (two) times daily as needed (IRRITATION).    Marland Kitchen Blood Glucose Monitoring Suppl (ONE TOUCH ULTRA 2) w/Device KIT Use to check blood sugar 1 each 0  . cetirizine (ZYRTEC) 10 MG tablet Take 1 tablet (10 mg total) by mouth daily. 90 tablet 1  . cetirizine (ZYRTEC) 10 MG tablet TAKE 1 TABLET BY MOUTH ONCE DAILY 90 tablet 1  . cholecalciferol (VITAMIN D) 1000 units tablet Take 1 tablet by mouth daily.     Marland Kitchen FERROUS SULFATE PO Take by mouth.    . Fluocinolone Acetonide (DERMOTIC) 0.01 % OIL Place 1 drop in ear(s) daily as needed (itching).     . gabapentin (NEURONTIN) 100 MG capsule TAKE 2 CAPSULES BY MOUTH AT BEDTIME 180 capsule 1  . glucose blood test strip Use to check blood sugar three times a day 300 each 2  . insulin NPH Human (NOVOLIN N RELION) 100 UNIT/ML injection Inject under skin 25 units in am and 25 units at bedtime (Patient taking differently: Inject 25 Units into the skin 2 (two) times daily before a meal. Inject under skin 25 units in am and 25 units at bedtime) 20 mL 3  . Insulin Regular Human (NOVOLIN R IJ) Inject 5-25 Units as directed See admin instructions. Pt states she takes 20-25 units at breakfast, 5-10 units at lunch, 20-25 units at dinner and takes a bedtime dose also.    . Insulin Syringe-Needle U-100 (RELION INSULIN SYRINGE 1ML/31G) 31G X 5/16" 1 ML MISC USE 2 TIMES A DAY 100 each 2  . losartan (COZAAR) 25 MG tablet TAKE 1 TABLET DAILY.  PLEASE MAKE AN APPOINTMENT WITH YOUR DOCTOR 30 tablet 0  . lovastatin (MEVACOR) 20 MG tablet TAKE 1 TABLET AT BEDTIME.  PLEASE MAKE AN APPOINTMENT WITH YOUR DOCTOR 90 tablet 0  . metFORMIN (GLUCOPHAGE) 1000 MG tablet TAKE 1 TABLET TWICE A DAY WITH A MEAL 60 tablet 1  . metoprolol succinate (TOPROL-XL) 25 MG 24 hr tablet TAKE 1 TABLET BY MOUTH EVERY  DAY 90 tablet 3  . naproxen sodium (ANAPROX) 220 MG tablet Take 220 mg by mouth daily as needed (pain).    . NOVOLIN R RELION 100 UNIT/ML injection INJECT 12 TO 30 UNITS INTO THE SKIN 3 TIMES DAILY BEFORE MEALS 30 mL 1  . omeprazole (PRILOSEC) 40 MG capsule Take 80 mg by mouth 2 (two) times daily.    Marland Kitchen OVER THE COUNTER MEDICATION Apply 1-3 application topically at bedtime. TOPRICIN FOOT CREAM - NEUROPATHY FOOT CREAM **APPLIES TO BOTH FEET AT BEDTIME**    . predniSONE (DELTASONE) 20 MG tablet Take 2 tablets (40 mg total) by mouth daily with breakfast. 8 tablet 0  . Probiotic Product (PROBIOTIC DAILY PO) Take 1 capsule by mouth daily.    . psyllium (METAMUCIL) 58.6 % packet Take 1 packet by mouth daily.    . vitamin B-12 (CYANOCOBALAMIN) 1000 MCG tablet Take 1,000 mcg daily by mouth.     Current Facility-Administered Medications on File Prior to Visit  Medication Dose Route Frequency Provider Last Rate Last Dose  . 0.9 %  sodium chloride infusion  500 mL Intravenous Once Ladene Artist, MD       Allergies  Allergen Reactions  . Codeine Other (See Comments)    Makes her crazy  . Propoxyphene Hcl Itching    *DARVOCET    Family History  Problem Relation Age of Onset  . Lung cancer Mother 42       smoked heavily  . Emphysema Mother   . Hypertension Father   . Hyperlipidemia Father   . Diabetes Father   . Coronary artery disease Father   . Dementia Father   . COPD Father        smoked  . Stomach cancer Paternal Aunt   . Brain cancer Paternal Uncle   . Irritable bowel syndrome Unknown        Several family members on fathers side   . Diabetes Other   . Stomach cancer Maternal Aunt   . Lung cancer Paternal Uncle   . Heart disease Paternal Uncle   . Colon cancer Neg Hx     Objective:   Physical Exam BP 126/60   Pulse 80   Ht '5\' 4"'  (1.626 m) Comment: measured  Wt 233 lb (105.7 kg)   SpO2 98%   BMI 39.99 kg/m  Body mass index is 39.99 kg/m. Wt Readings from Last 3  Encounters:  11/28/17 233 lb (105.7 kg)  11/07/17 231 lb 4.8 oz (104.9 kg)  10/13/17 231 lb 1.9 oz (104.8 kg)   Constitutional: overweight, in NAD Eyes: PERRLA, EOMI, no exophthalmos ENT: moist mucous membranes, no thyromegaly, no cervical lymphadenopathy Cardiovascular: RRR, No MRG Respiratory: CTA B Gastrointestinal: abdomen soft, NT, ND, BS+ Musculoskeletal: : + deformities (several amputated toes-see below; Charcot deformity in left foot), strength intact in all 4 Skin: moist, warm, no rashes Neurological: no tremor with outstretched hands, DTR normal in all 4  Assessment:     1. DM2, uncontrolled, insulin-dependent, with complications - diabetic peripheral neuropathy - 3 toe amputations - after infected diabetic toe ulcers >>  OM:  R 2nd toe amputated on 12/28/2011  R 3rd toe amputated on 04/25/2012  R 4th toe amputated on 07/27/2012  L 2nd toe amputated on 07/15/2016  L1st and 5th toes amputated 11/18/2016    2. 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 HL  Plan:     1. DM2 - pt with long-standing, uncontrolled, type 2 diabetes, on basal-bolus insulin regimen and metformin.  Her sugars are usually very fluctuating, a pattern more consistent with insulin deficiency.  We did not check her for this since this would not change her management.  At last visit, HbA1c was improved to 7.0% but she had low blood sugars at night, with the lowest being 51.  We decreased her NPH at bedtime.  Of note, she could not tolerate GLP-1 receptor agonist in the past due to GI symptoms and weakness. - At this visit, sugars are still very variable but the most noticeable pattern is of low blood sugars during the night.  She can drop her sugars as low as 47 but frequently has sugars in the 50s and 60s overnight.  When she corrects these her sugars in the morning increase.  Otherwise, they are mostly at goal.  She has also occasional lows during the day but also sugars in the 200s related to meals or over correction of  lows. - She tells me that she started to change her diet recently to include lighter meals especially with dinner.  We will therefore decrease her regular insulin dose with dinner and, to avoid further lows at night, we will also decrease her NPH dose at bedtime. - I strongly advised her to let me know if she continues to have low blood sugars and not wait until next visit - I advised her to: Patient Instructions   Please continue: - Metformin 1000 mg 2x a day  Insulin Before breakfast Before lunch Before dinner At bedtime  Regular  20 units with a small meal  25 units with a larger meal  5 units with a smaller meal  10 units with a larger meal  10 units with a smaller meal  15 units with a larger meal   NPH 20 x x 15   Please return in 4 months with your sugar log.   - today, HbA1c is 6.6% (improved) - continue checking sugars at different times of the day - check 3x a day, rotating checks - advised for yearly eye exams >> she is UTD - Return to clinic in 3-4 mo with sugar log   2. PN -Due to diabetes -Continues to have tingling and pain at night -She had to stop amitriptyline due to brain fog, but continues on Neurontin  3. HL - Reviewed latest lipid panel from 07/2017: Triglycerides slightly high, LDL at target Lab Results  Component Value Date   CHOL 172 07/28/2017   HDL 46.40 07/28/2017   LDLCALC 91 07/28/2017   TRIG 171.0 (H) 07/28/2017   CHOLHDL 4 07/28/2017  - Continues Mevacor without side effects.  Philemon Kingdom, MD PhD [facility] Endocrinology

## 2017-11-28 NOTE — Patient Instructions (Addendum)
Please continue: - Metformin 1000 mg 2x a day  Insulin Before breakfast Before lunch Before dinner At bedtime  Regular  20 units with a small meal  25 units with a larger meal  5 units with a smaller meal  10 units with a larger meal  10 units with a smaller meal  15 units with a larger meal   NPH 20 x x 15   Please return in 4 months with your sugar log.

## 2017-11-30 ENCOUNTER — Ambulatory Visit (INDEPENDENT_AMBULATORY_CARE_PROVIDER_SITE_OTHER): Payer: Medicare Other | Admitting: Family Medicine

## 2017-11-30 ENCOUNTER — Encounter: Payer: Self-pay | Admitting: Family Medicine

## 2017-11-30 VITALS — BP 118/70 | HR 79 | Temp 98.4°F | Ht 65.0 in | Wt 231.3 lb

## 2017-11-30 DIAGNOSIS — E785 Hyperlipidemia, unspecified: Secondary | ICD-10-CM | POA: Diagnosis not present

## 2017-11-30 DIAGNOSIS — R05 Cough: Secondary | ICD-10-CM | POA: Diagnosis not present

## 2017-11-30 DIAGNOSIS — E1159 Type 2 diabetes mellitus with other circulatory complications: Secondary | ICD-10-CM | POA: Diagnosis not present

## 2017-11-30 DIAGNOSIS — I152 Hypertension secondary to endocrine disorders: Secondary | ICD-10-CM

## 2017-11-30 DIAGNOSIS — Z23 Encounter for immunization: Secondary | ICD-10-CM | POA: Diagnosis not present

## 2017-11-30 DIAGNOSIS — E1169 Type 2 diabetes mellitus with other specified complication: Secondary | ICD-10-CM | POA: Diagnosis not present

## 2017-11-30 DIAGNOSIS — E1165 Type 2 diabetes mellitus with hyperglycemia: Secondary | ICD-10-CM | POA: Diagnosis not present

## 2017-11-30 DIAGNOSIS — E1151 Type 2 diabetes mellitus with diabetic peripheral angiopathy without gangrene: Secondary | ICD-10-CM

## 2017-11-30 DIAGNOSIS — R059 Cough, unspecified: Secondary | ICD-10-CM

## 2017-11-30 DIAGNOSIS — I10 Essential (primary) hypertension: Secondary | ICD-10-CM

## 2017-11-30 DIAGNOSIS — IMO0002 Reserved for concepts with insufficient information to code with codable children: Secondary | ICD-10-CM

## 2017-11-30 MED ORDER — IRBESARTAN 75 MG PO TABS: 75.0000 mg | ORAL_TABLET | Freq: Every day | ORAL | 1 refills | Status: DC

## 2017-11-30 MED ORDER — BETAMETHASONE DIPROPIONATE 0.05 % EX CREA: 1.0000 "application " | TOPICAL_CREAM | Freq: Two times a day (BID) | CUTANEOUS | 0 refills | Status: AC | PRN

## 2017-11-30 MED ORDER — FLUTICASONE PROPIONATE HFA 44 MCG/ACT IN AERO: 1.0000 | INHALATION_SPRAY | Freq: Two times a day (BID) | RESPIRATORY_TRACT | 0 refills | Status: DC

## 2017-11-30 NOTE — Patient Instructions (Signed)
BEFORE YOU LEAVE: -lab for cholesterol check if open -flu shot -schedule your mammogram in 1 month -DEXA in 1 month -follow up: Medicare wellness in 1-2 months WITH Dr. Maudie Mercury  Try the flovent 1 puff morning and night for the cough for 4 weeks.  Change from losartan to irbesartan - I sent the prescription to the pharmacy   We recommend the following healthy lifestyle for LIFE: 1) Small portions. But, make sure to get regular (at least 3 per day), healthy meals and small healthy snacks if needed.  2) Eat a healthy clean diet.   TRY TO EAT: -at least 5-7 servings of low sugar, colorful, and nutrient rich vegetables per day (not corn, potatoes or bananas.) -berries are the best choice if you wish to eat fruit (only eat small amounts if trying to reduce weight)  -lean meets (fish, white meat of chicken or [place]) -vegan proteins for some meals - beans or tofu, whole grains, nuts and seeds -Replace bad fats with good fats - good fats include: fish, nuts and seeds, canola oil, olive oil -small amounts of low fat or non fat dairy -small amounts of100 % whole grains - check the lables -drink plenty of water  AVOID: -SUGAR, sweets, anything with added sugar, corn syrup or sweeteners - must read labels as even foods advertised as "healthy" often are loaded with sugar -if you must have a sweetener, small amounts of stevia may be best -sweetened beverages and artificially sweetened beverages -simple starches (rice, bread, potatoes, pasta, chips, etc - small amounts of 100% whole grains are ok) -red meat, pork, butter -fried foods, fast food, processed food, excessive dairy, eggs and coconut.  3)Get at least 150 minutes of sweaty aerobic exercise per week.  4)Reduce stress - consider counseling, meditation and relaxation to balance other aspects of your life.

## 2017-11-30 NOTE — Progress Notes (Signed)
HPI:  Using dictation device. Unfortunately this device frequently misinterprets words/phrases.  Concerns and/or follow up today: Due for her diabetic eye exam, flu vaccine, mammogram next month, DEXA next month  Beth Holden is a pleasant 72 year old here for her a physical, but has a number of other issues to address instead. She has a complicated past medical history significant for diabetes with circulatory, neurological complications and a history of diabetic foot ulcers with amputations, hypertension, hyperlipidemia, cardiac, anemia and morbid obesity. Chronic cough, worse after colds. Has not improved much. Saw Dr. Melvyn Novas in the past and did not like him. Dx upper airway cough syndrome. She does not tolerate alb - makes her heart race. Did not tolerate prednisone as elevated her blood sugar to 400. No fever, sob, wheezing, malaise. She also is worried about the losartan with the recent recalls. Is interested in trying something else. Seeing eye doctor and has to have cataract surgery. Wants refill on excema cream. She sees Dr. Ethelene Hal about the diabetes and had a visit recently.  Sees a number of other specialist as well including cardiology, orthopedics, hematology recently. See list of current specialist scanned documentation. Her hemoglobin A1c was checked recently and looks good.  Basic metabolic panel lipids and CBC checked fairly recently and looked okay. She wants to recheck lipids today as fasting.   ROS: See pertinent positives and negatives per HPI.  Past Medical History:  Diagnosis Date  . Alopecia   . Anemia, mild   . Arthritis   . Chronic cough    sees pulmonologist  . Chronic pain    chest wall and abd - s/p extensive eval  . Diabetes mellitus with neuropathy ([place])    sees endocrine  . Diverticulosis   . Fatty liver   . GERD (gastroesophageal reflux disease)    takes Nexium bid, hx erosive esophagitis  . Headache(784.0)    occasionally;r/t sinus   . History of  colon polyps   . HTN (hypertension)   . Hx of amputation of lesser toe ([place])    sees podiatrist  . Hyperlipemia   . IBS (irritable bowel syndrome)   . Insomnia    takes Elavil nightly  . Joint pain   . Neuropathy   . Osteomyelitis ([place])   . Pneumonia   . PONV (postoperative nausea and vomiting)   . Seasonal allergies    takes Zyrtec daily  . Sinus tachycardia     Past Surgical History:  Procedure Laterality Date  . AMPUTATION  12/28/2011   Procedure: AMPUTATION DIGIT;  Surgeon: Newt Minion, MD;  Location: [facility];  Service: Orthopedics;  Laterality: Right;  Right Foot 2nd Toe Amputation at MTP (metatarsophalangeal joint)  . AMPUTATION Right 04/25/2012   Procedure: Right Foot 3rd Toe Amputation;  Surgeon: Newt Minion, MD;  Location: [facility];  Service: Orthopedics;  Laterality: Right;  Right Foot Third Toe Amputation   . AMPUTATION Right 07/27/2012   Procedure: Right 4th Toe Amputation at Metatarsophalangeal;  Surgeon: Newt Minion, MD;  Location: [facility];  Service: Orthopedics;  Laterality: Right;  Right 4th Toe Amputation at Metatarsophalangeal  . AMPUTATION Left 07/15/2016   Procedure: Left 2nd Ray Amputation;  Surgeon: Newt Minion, MD;  Location: [facility];  Service: Orthopedics;  Laterality: Left;  . AMPUTATION Left 11/18/2016   Procedure: Left 3rd and 4th Ray Amputation;  Surgeon: Newt Minion, MD;  Location: [facility];  Service: Orthopedics;  Laterality: Left;  . AMPUTATION Right 03/29/2017   Procedure:  RIGHT FOOT 3RD AND 4TH RAY AMPUTATION;  Surgeon: Newt Minion, MD;  Location: [place];  Service: Orthopedics;  Laterality: Right;  . COLONOSCOPY    . LAPAROSCOPIC APPENDECTOMY  01/05/2011   Procedure: APPENDECTOMY LAPAROSCOPIC;  Surgeon: Pedro Earls, MD;  [facility];  Service: General;  Laterality: N/A;  . OOPHORECTOMY  2001  . ROTATOR CUFF REPAIR Right    x 2  . TUBAL LIGATION    . VAGINAL HYSTERECTOMY  2001    Family History  Problem Relation Age of Onset  . Lung  cancer Mother 8       smoked heavily  . Emphysema Mother   . Hypertension Father   . Hyperlipidemia Father   . Diabetes Father   . Coronary artery disease Father   . Dementia Father   . COPD Father        smoked  . Stomach cancer Paternal Aunt   . Brain cancer Paternal Uncle   . Irritable bowel syndrome Unknown        Several family members on fathers side   . Diabetes Other   . Stomach cancer Maternal Aunt   . Lung cancer Paternal Uncle   . Heart disease Paternal Uncle   . Colon cancer Neg Hx     SOCIAL HX: see hpi   Current Outpatient Medications:  .  acetaminophen (TYLENOL) 500 MG tablet, Take 500 mg by mouth every 8 (eight) hours as needed for mild pain. , Disp: , Rfl:  .  benzonatate (TESSALON PERLES) 100 MG capsule, Take 1 capsule (100 mg total) by mouth 3 (three) times daily as needed., Disp: 20 capsule, Rfl: 0 .  betamethasone dipropionate (DIPROLENE) 0.05 % cream, Apply 1 application topically 2 (two) times daily as needed (IRRITATION)., Disp: 30 g, Rfl: 0 .  Blood Glucose Monitoring Suppl (ONE TOUCH ULTRA 2) w/Device KIT, Use to check blood sugar, Disp: 1 each, Rfl: 0 .  cetirizine (ZYRTEC) 10 MG tablet, TAKE 1 TABLET BY MOUTH ONCE DAILY, Disp: 90 tablet, Rfl: 1 .  cholecalciferol (VITAMIN D) 1000 units tablet, Take 1 tablet by mouth daily. , Disp: , Rfl:  .  FERROUS SULFATE PO, Take by mouth., Disp: , Rfl:  .  Fluocinolone Acetonide (DERMOTIC) 0.01 % OIL, Place 1 drop in ear(s) daily as needed (itching). , Disp: , Rfl:  .  gabapentin (NEURONTIN) 100 MG capsule, TAKE 2 CAPSULES BY MOUTH AT BEDTIME, Disp: 180 capsule, Rfl: 1 .  glucose blood test strip, Use to check blood sugar three times a day, Disp: 300 each, Rfl: 2 .  insulin NPH Human (NOVOLIN N RELION) 100 UNIT/ML injection, Inject under skin 25 units in am and 25 units at bedtime, Disp: 20 mL, Rfl: 3 .  Insulin Regular Human (NOVOLIN R IJ), Inject 5-25 Units as directed See admin instructions. Pt states she takes  20-25 units at breakfast, 5-10 units at lunch, 20-25 units at dinner and takes a bedtime dose also., Disp: , Rfl:  .  Insulin Syringe-Needle U-100 (RELION INSULIN SYRINGE 1ML/31G) 31G X 5/16" 1 ML MISC, USE 2 TIMES A DAY, Disp: 100 each, Rfl: 2 .  lovastatin (MEVACOR) 20 MG tablet, TAKE 1 TABLET AT BEDTIME.  PLEASE MAKE AN APPOINTMENT WITH YOUR DOCTOR, Disp: 90 tablet, Rfl: 0 .  metFORMIN (GLUCOPHAGE) 1000 MG tablet, TAKE 1 TABLET TWICE A DAY WITH A MEAL, Disp: 60 tablet, Rfl: 1 .  metoprolol succinate (TOPROL-XL) 25 MG 24 hr tablet, TAKE 1 TABLET BY MOUTH  EVERY DAY, Disp: 90 tablet, Rfl: 3 .  naproxen sodium (ANAPROX) 220 MG tablet, Take 220 mg by mouth daily as needed (pain)., Disp: , Rfl:  .  NOVOLIN R RELION 100 UNIT/ML injection, INJECT 12 TO 30 UNITS INTO THE SKIN 3 TIMES DAILY BEFORE MEALS, Disp: 30 mL, Rfl: 1 .  omeprazole (PRILOSEC) 40 MG capsule, Take 80 mg by mouth 2 (two) times daily., Disp: , Rfl:  .  OVER THE COUNTER MEDICATION, Apply 1-3 application topically at bedtime. TOPRICIN FOOT CREAM - NEUROPATHY FOOT CREAM **APPLIES TO BOTH FEET AT BEDTIME**, Disp: , Rfl:  .  Probiotic Product (PROBIOTIC DAILY PO), Take 1 capsule by mouth daily., Disp: , Rfl:  .  psyllium (METAMUCIL) 58.6 % packet, Take 1 packet by mouth daily., Disp: , Rfl:  .  vitamin B-12 (CYANOCOBALAMIN) 1000 MCG tablet, Take 1,000 mcg daily by mouth., Disp: , Rfl:  .  fluticasone (FLOVENT HFA) 44 MCG/ACT inhaler, Inhale 1 puff into the lungs 2 (two) times daily., Disp: 1 Inhaler, Rfl: 0 .  irbesartan (AVAPRO) 75 MG tablet, Take 1 tablet (75 mg total) by mouth daily., Disp: 90 tablet, Rfl: 1  Current Facility-Administered Medications:  .  0.9 %  sodium chloride infusion, 500 mL, Intravenous, Once, Ladene Artist, MD  EXAM:  Vitals:   11/30/17 1652  BP: 118/70  Pulse: 79  Temp: 98.4 F (36.9 C)    Body mass index is 38.49 kg/m.  GENERAL: vitals reviewed and listed above, alert, oriented, appears well  hydrated and in no acute distress  HEENT: atraumatic, conjunttiva clear, no obvious abnormalities on inspection of external nose and ears  NECK: no obvious masses on inspection  LUNGS: clear to auscultation bilaterally, no wheezes, rales or rhonchi, good air movement  CV: HRRR, no peripheral edema  MS: moves all extremities without noticeable abnormality  PSYCH: pleasant and cooperative, no obvious depression or anxiety  ASSESSMENT AND PLAN:  Discussed the following assessment and plan:  Need for immunization against influenza - Plan: Flu vaccine HIGH DOSE PF (Fluzone High dose)  Cough  Hyperlipidemia associated with type 2 diabetes mellitus ([place]) - Plan: Lipid panel  Uncontrolled type 2 diabetes mellitus with peripheral circulatory disorder (HCC)  Hypertension associated with diabetes ([place])  Morbid obesity ([place])  -trial ICS for the cough since did not tolerate oral, repeat imaging and referral to different pulmonologist if persist -change arb to irbesartan given recalls -lipid pain -flu shot -lifestyle recs for healthy diet and regular exercise -advised of HM due -reschedule annual exxam to 1-2 months  -Patient advised to return or notify a doctor immediately if symptoms worsen or persist or new concerns arise.  Patient Instructions  BEFORE YOU LEAVE: -lab for cholesterol check if open -flu shot -schedule your mammogram in 1 month -DEXA in 1 month -follow up: Medicare wellness in 1-2 months WITH Dr. Maudie Mercury  Try the flovent 1 puff morning and night for the cough for 4 weeks.  Change from losartan to irbesartan - I sent the prescription to the pharmacy   We recommend the following healthy lifestyle for LIFE: 1) Small portions. But, make sure to get regular (at least 3 per day), healthy meals and small healthy snacks if needed.  2) Eat a healthy clean diet.   TRY TO EAT: -at least 5-7 servings of low sugar, colorful, and nutrient rich vegetables per day (not  corn, potatoes or bananas.) -berries are the best choice if you wish to eat fruit (only eat small amounts  if trying to reduce weight)  -lean meets (fish, white meat of chicken or [place]) -vegan proteins for some meals - beans or tofu, whole grains, nuts and seeds -Replace bad fats with good fats - good fats include: fish, nuts and seeds, canola oil, olive oil -small amounts of low fat or non fat dairy -small amounts of100 % whole grains - check the lables -drink plenty of water  AVOID: -SUGAR, sweets, anything with added sugar, corn syrup or sweeteners - must read labels as even foods advertised as "healthy" often are loaded with sugar -if you must have a sweetener, small amounts of stevia may be best -sweetened beverages and artificially sweetened beverages -simple starches (rice, bread, potatoes, pasta, chips, etc - small amounts of 100% whole grains are ok) -red meat, pork, butter -fried foods, fast food, processed food, excessive dairy, eggs and coconut.  3)Get at least 150 minutes of sweaty aerobic exercise per week.  4)Reduce stress - consider counseling, meditation and relaxation to balance other aspects of your life.     Lucretia Kern, DO

## 2017-12-01 LAB — GLUCOSE, POCT (MANUAL RESULT ENTRY): POC Glucose: 61 mg/dl — AB (ref 70–99)

## 2017-12-01 LAB — LIPID PANEL
Cholesterol: 184 mg/dL (ref 0–200)
HDL: 48.4 mg/dL (ref >39.00)
LDL Cholesterol: 111 mg/dL — ABNORMAL HIGH (ref 0–99)
NonHDL: 135.28
Total CHOL/HDL Ratio: 4
Triglycerides: 122 mg/dL (ref 0.0–149.0)
VLDL: 24.4 mg/dL (ref 0.0–40.0)

## 2017-12-01 NOTE — Addendum Note (Signed)
Addended by: Agnes Lawrence on: 12/01/2017 08:43 AM   Modules accepted: Orders

## 2017-12-04 ENCOUNTER — Telehealth: Payer: Self-pay | Admitting: *Deleted

## 2017-12-04 DIAGNOSIS — H25813 Combined forms of age-related cataract, bilateral: Secondary | ICD-10-CM | POA: Diagnosis not present

## 2017-12-04 DIAGNOSIS — H43811 Vitreous degeneration, right eye: Secondary | ICD-10-CM | POA: Diagnosis not present

## 2017-12-04 MED ORDER — ROSUVASTATIN CALCIUM 20 MG PO TABS: 20.0000 mg | ORAL_TABLET | Freq: Every day | ORAL | 1 refills | Status: DC

## 2017-12-04 NOTE — Addendum Note (Signed)
Addended by: Agnes Lawrence on: 12/04/2017 01:55 PM   Modules accepted: Orders

## 2017-12-04 NOTE — Telephone Encounter (Signed)
I called the pt and informed her of the lab test results.  She wanted to let Dr Maudie Mercury know she did not pick up the Rx for Flovent inhaler as the price was over $200.  I called [facility] and spoke with Joe the pharmacist and he stated the Rx cost $47 after billing through her insurance as the cost was higher with a discount card.  I called the pt and left a detailed message with this info at her cell number.

## 2018-01-04 ENCOUNTER — Encounter: Payer: Medicare Other | Admitting: Family Medicine

## 2018-01-08 ENCOUNTER — Ambulatory Visit (INDEPENDENT_AMBULATORY_CARE_PROVIDER_SITE_OTHER): Payer: Medicare Other | Admitting: Orthopedic Surgery

## 2018-01-08 ENCOUNTER — Encounter (INDEPENDENT_AMBULATORY_CARE_PROVIDER_SITE_OTHER): Payer: Self-pay | Admitting: Orthopedic Surgery

## 2018-01-08 VITALS — Ht 65.0 in | Wt 231.3 lb

## 2018-01-08 DIAGNOSIS — B351 Tinea unguium: Secondary | ICD-10-CM | POA: Diagnosis not present

## 2018-01-08 DIAGNOSIS — L97511 Non-pressure chronic ulcer of other part of right foot limited to breakdown of skin: Secondary | ICD-10-CM

## 2018-01-08 NOTE — Progress Notes (Signed)
Office Visit Note   [name]           Date of Birth: January 29, 1945           MRN: [id number] Visit Date: 01/08/2018              Requested by: Lucretia Kern, DO [address] [place], [place] [address] PCP: Lucretia Kern, DO  Chief Complaint  Patient presents with  . Left Foot - Follow-up  . Right Foot - Follow-up      HPI: Patient is a 72 year old woman who presents with a new blister ulcer over the dorsum of the right foot.  She also complains of painful onychomycotic nails x4.  Patient states the blister has gotten better since she has been wearing the medical compression stocking.  She states she has tried using pads to relieve pressure from her custom shoes.  Assessment & Plan: Visit Diagnoses:  1. Onychomycosis   2. Non-pressure chronic ulcer of other part of right foot limited to breakdown of skin (HCC)     Plan: Patient's shoes were placed to unload pressure from the midfoot.  Patient felt comfortable with this she will continue with her medical compression stockings.  Follow-Up Instructions: Return in about 4 weeks (around 02/05/2018).   Ortho Exam  Patient is alert, oriented, no adenopathy, well-dressed, normal affect, normal respiratory effort. Examination patient has palpable pulses her surgical incisions have healed nicely bilaterally.  The new ulcer over the dorsum of the right foot she has a 2 blisters over the medial column base of the first metatarsal medial cuneiform navicular area with bony prominence and blistering.  There is no cellulitis no exposed bone or tendon there is good granulation tissue at the base of the ulcers these are 10 mm in diameter 0.1 mm deep.  Patient has thickened discolored onychomycotic nails x4 she is unable to safely trim them on her own the nails were trimmed x4 without complications.  There is no cellulitis in her foot no pain to palpation of the calf.  Imaging: No results found. No images are attached to the  encounter.  Labs: Lab Results  Component Value Date   HGBA1C 6.6 (A) 11/28/2017   HGBA1C 7.0 (A) 07/28/2017   HGBA1C 7.4 (H) 03/29/2017   ESRSEDRATE 36 (H) 02/24/2017   CRP 1.2 (H) 02/24/2017   LABURIC 5.5 09/01/2015   LABORGA GROUP B STREP (S.AGALACTIAE) ISOLATED 12/26/2014     Lab Results  Component Value Date   ALBUMIN 3.8 05/19/2017   ALBUMIN 3.6 04/21/2017   ALBUMIN 3.6 02/24/2017   PREALBUMIN 19.4 03/05/2012   LABURIC 5.5 09/01/2015    Body mass index is 38.49 kg/m.  Orders:  No orders of the defined types were placed in this encounter.  No orders of the defined types were placed in this encounter.    Procedures: No procedures performed  Clinical Data: No additional findings.  ROS:  All other systems negative, except as noted in the HPI. Review of Systems  Objective: Vital Signs: Ht 5\' 5"  (1.651 m)   Wt 231 lb 4.8 oz (104.9 kg)   BMI 38.49 kg/m   Specialty Comments:  No specialty comments available.  PMFS History: Patient Active Problem List   Diagnosis Date Noted  . Hyperlipidemia associated with type 2 diabetes mellitus ([place]) 11/30/2017  . Impingement syndrome of right shoulder 07/06/2017  . Iron deficiency anemia 05/11/2017  . Morbid obesity ([place]) 04/24/2017  . Acute midline low back pain without  sciatica 04/11/2017  . Subacute osteomyelitis, right ankle and foot ([place])   . Anemia 02/24/2017  . Right foot ulcer, limited to breakdown of skin ([place]) 09/26/2016  . Osteomyelitis of toe of left foot ([place])   . Baker's cyst 07/10/2016  . Tachycardia 03/04/2016  . Onychomycosis 12/07/2015  . Non-pressure chronic ulcer of other part of right foot limited to breakdown of skin ([place]) 12/07/2015  . Upper airway cough syndrome 03/05/2014  . History of amputation of lesser toe of right foot ([place]) 04/15/2013  . Acute pain of right shoulder 04/15/2013  . Hypertension associated with diabetes ([place]) 12/05/2006  . Uncontrolled type 2 diabetes mellitus with  peripheral circulatory disorder ([place]) 10/12/2006  . Hyperlipidemia 10/12/2006  . Allergic rhinitis 10/12/2006  . GERD - Followed by Dr. Fuller Plan 10/12/2006  . IRRITABLE BOWEL SYNDROME - followed by Dr. Fuller Plan 10/12/2006  . Peripheral neuropathy 10/11/2006  . ALOPECIA NEC 10/11/2006   Past Medical History:  Diagnosis Date  . Alopecia   . Anemia, mild   . Arthritis   . Chronic cough    sees pulmonologist  . Chronic pain    chest wall and abd - s/p extensive eval  . Diabetes mellitus with neuropathy ([place])    sees endocrine  . Diverticulosis   . Fatty liver   . GERD (gastroesophageal reflux disease)    takes Nexium bid, hx erosive esophagitis  . Headache(784.0)    occasionally;r/t sinus   . History of colon polyps   . HTN (hypertension)   . Hx of amputation of lesser toe ([place])    sees podiatrist  . Hyperlipemia   . IBS (irritable bowel syndrome)   . Insomnia    takes Elavil nightly  . Joint pain   . Neuropathy   . Osteomyelitis ([place])   . Pneumonia   . PONV (postoperative nausea and vomiting)   . Seasonal allergies    takes Zyrtec daily  . Sinus tachycardia     Family History  Problem Relation Age of Onset  . Lung cancer Mother 10       smoked heavily  . Emphysema Mother   . Hypertension Father   . Hyperlipidemia Father   . Diabetes Father   . Coronary artery disease Father   . Dementia Father   . COPD Father        smoked  . Stomach cancer Paternal Aunt   . Brain cancer Paternal Uncle   . Irritable bowel syndrome Unknown        Several family members on fathers side   . Diabetes Other   . Stomach cancer Maternal Aunt   . Lung cancer Paternal Uncle   . Heart disease Paternal Uncle   . Colon cancer Neg Hx     Past Surgical History:  Procedure Laterality Date  . AMPUTATION  12/28/2011   Procedure: AMPUTATION DIGIT;  Surgeon: Newt Minion, MD;  Location: [facility];  Service: Orthopedics;  Laterality: Right;  Right Foot 2nd Toe Amputation at MTP (metatarsophalangeal  joint)  . AMPUTATION Right 04/25/2012   Procedure: Right Foot 3rd Toe Amputation;  Surgeon: Newt Minion, MD;  Location: [facility];  Service: Orthopedics;  Laterality: Right;  Right Foot Third Toe Amputation   . AMPUTATION Right 07/27/2012   Procedure: Right 4th Toe Amputation at Metatarsophalangeal;  Surgeon: Newt Minion, MD;  Location: [facility];  Service: Orthopedics;  Laterality: Right;  Right 4th Toe Amputation at Metatarsophalangeal  . AMPUTATION Left 07/15/2016   Procedure: Left  2nd Ray Amputation;  Surgeon: Newt Minion, MD;  Location: [facility];  Service: Orthopedics;  Laterality: Left;  . AMPUTATION Left 11/18/2016   Procedure: Left 3rd and 4th Ray Amputation;  Surgeon: Newt Minion, MD;  Location: [facility];  Service: Orthopedics;  Laterality: Left;  . AMPUTATION Right 03/29/2017   Procedure: RIGHT FOOT 3RD AND 4TH RAY AMPUTATION;  Surgeon: Newt Minion, MD;  Location: [facility];  Service: Orthopedics;  Laterality: Right;  . COLONOSCOPY    . LAPAROSCOPIC APPENDECTOMY  01/05/2011   Procedure: APPENDECTOMY LAPAROSCOPIC;  Surgeon: Pedro Earls, MD;  [facility];  Service: General;  Laterality: N/A;  . OOPHORECTOMY  2001  . ROTATOR CUFF REPAIR Right    x 2  . TUBAL LIGATION    . VAGINAL HYSTERECTOMY  2001   Social History   Occupational History  . Occupation: Retired   Tobacco Use  . Smoking status: Never Smoker  . Smokeless tobacco: Never Used  Substance and Sexual Activity  . Alcohol use: No  . Drug use: No  . Sexual activity: Yes    Birth control/protection: Surgical

## 2018-01-17 ENCOUNTER — Inpatient Hospital Stay: Payer: Medicare Other | Attending: Family Medicine

## 2018-01-17 DIAGNOSIS — D508 Other iron deficiency anemias: Secondary | ICD-10-CM

## 2018-01-17 DIAGNOSIS — D509 Iron deficiency anemia, unspecified: Secondary | ICD-10-CM | POA: Diagnosis not present

## 2018-01-17 LAB — URINALYSIS, COMPLETE (UACMP) WITH MICROSCOPIC
Bacteria, UA: NONE SEEN
Bilirubin Urine: NEGATIVE
Glucose, UA: NEGATIVE mg/dL
Ketones, ur: NEGATIVE mg/dL
Leukocytes, UA: NEGATIVE
Nitrite: NEGATIVE
Protein, ur: NEGATIVE mg/dL
Specific Gravity, Urine: 1.009 (ref 1.005–1.030)
pH: 5 (ref 5.0–8.0)

## 2018-01-17 LAB — CBC WITH DIFFERENTIAL (CANCER CENTER ONLY)
Abs Immature Granulocytes: 0.06 10*3/uL (ref 0.00–0.07)
Basophils Absolute: 0.1 10*3/uL (ref 0.0–0.1)
Basophils Relative: 1 %
Eosinophils Absolute: 0.3 10*3/uL (ref 0.0–0.5)
Eosinophils Relative: 2 %
HCT: 40.1 % (ref 36.0–46.0)
Hemoglobin: 12.6 g/dL (ref 12.0–15.0)
Immature Granulocytes: 1 %
Lymphocytes Relative: 21 %
Lymphs Abs: 2.5 10*3/uL (ref 0.7–4.0)
MCH: 27.2 pg (ref 26.0–34.0)
MCHC: 31.4 g/dL (ref 30.0–36.0)
MCV: 86.6 fL (ref 80.0–100.0)
Monocytes Absolute: 0.6 10*3/uL (ref 0.1–1.0)
Monocytes Relative: 6 %
Neutro Abs: 8.2 10*3/uL — ABNORMAL HIGH (ref 1.7–7.7)
Neutrophils Relative %: 69 %
Platelet Count: 372 10*3/uL (ref 150–400)
RBC: 4.63 MIL/uL (ref 3.87–5.11)
RDW: 13.4 % (ref 11.5–15.5)
WBC Count: 11.7 10*3/uL — ABNORMAL HIGH (ref 4.0–10.5)
nRBC: 0 % (ref 0.0–0.2)

## 2018-01-18 ENCOUNTER — Ambulatory Visit (INDEPENDENT_AMBULATORY_CARE_PROVIDER_SITE_OTHER): Payer: Medicare Other | Admitting: Orthopedic Surgery

## 2018-01-20 ENCOUNTER — Encounter: Payer: Self-pay | Admitting: Internal Medicine

## 2018-01-22 ENCOUNTER — Other Ambulatory Visit: Payer: Self-pay

## 2018-01-22 MED ORDER — INSULIN REGULAR HUMAN 100 UNIT/ML IJ SOLN: INTRAMUSCULAR | 1 refills | Status: DC

## 2018-02-12 ENCOUNTER — Ambulatory Visit (INDEPENDENT_AMBULATORY_CARE_PROVIDER_SITE_OTHER): Payer: Medicare Other | Admitting: Orthopedic Surgery

## 2018-02-12 ENCOUNTER — Encounter (INDEPENDENT_AMBULATORY_CARE_PROVIDER_SITE_OTHER): Payer: Self-pay | Admitting: Orthopedic Surgery

## 2018-02-12 VITALS — Ht 65.0 in | Wt 231.0 lb

## 2018-02-12 DIAGNOSIS — M7541 Impingement syndrome of right shoulder: Secondary | ICD-10-CM

## 2018-02-12 DIAGNOSIS — M14672 Charcot's joint, left ankle and foot: Secondary | ICD-10-CM

## 2018-02-12 MED ORDER — METHYLPREDNISOLONE ACETATE 40 MG/ML IJ SUSP
40.0000 mg | INTRAMUSCULAR | Status: AC | PRN
  Administered 2018-02-12: 40 mg via INTRA_ARTICULAR

## 2018-02-12 MED ORDER — LIDOCAINE HCL 1 % IJ SOLN
5.0000 mL | INTRAMUSCULAR | Status: AC | PRN
  Administered 2018-02-12: 5 mL

## 2018-02-12 NOTE — Progress Notes (Signed)
Office Visit Note   [name]           Date of Birth: Apr 19, 1945           MRN: [id number] Visit Date: 02/12/2018              Requested by: Lucretia Kern, DO [address] [place], [place] [address] PCP: Lucretia Kern, DO  Chief Complaint  Patient presents with  . Right Foot - Follow-up      HPI: Patient is a 73 year old woman who presents in follow-up for left foot Charcot collapse with Wegner grade 1 ulcer as well as persistent impingement symptoms of the right shoulder she has pain using her computer.  Assessment & Plan: Visit Diagnoses:  1. Charcot's joint, left ankle and foot   2. Impingement syndrome of right shoulder     Plan: Subacromial injection was provided callus was pared on the left continue with a protective shoe wear and bracing follow-up as scheduled  Follow-Up Instructions: Return in about 2 months (around 04/13/2018).   Ortho Exam  Patient is alert, oriented, no adenopathy, well-dressed, normal affect, normal respiratory effort. Examination patient has abduction and flexion of the right shoulder to 90 degrees.  She has pain with Neer and Hawkins impingement test pain to palpation of the biceps tendon.  Semination the left foot she has a Charcot rocker-bottom deformity with a callus over the medial rocker-bottom deformity.  After informed consent a 10 blade knife was used to pare the callus without complications there is no open wound.  The blister on the dorsum of the right foot is completely healed since she change the lacing of her sneakers.  Imaging: No results found. No images are attached to the encounter.  Labs: Lab Results  Component Value Date   HGBA1C 6.6 (A) 11/28/2017   HGBA1C 7.0 (A) 07/28/2017   HGBA1C 7.4 (H) 03/29/2017   ESRSEDRATE 36 (H) 02/24/2017   CRP 1.2 (H) 02/24/2017   LABURIC 5.5 09/01/2015   LABORGA GROUP B STREP (S.AGALACTIAE) ISOLATED 12/26/2014     Lab Results  Component Value Date   ALBUMIN 3.8  05/19/2017   ALBUMIN 3.6 04/21/2017   ALBUMIN 3.6 02/24/2017   PREALBUMIN 19.4 03/05/2012   LABURIC 5.5 09/01/2015    Body mass index is 38.44 kg/m.  Orders:  No orders of the defined types were placed in this encounter.  No orders of the defined types were placed in this encounter.    Procedures: Large Joint Inj: R subacromial bursa on 02/12/2018 10:22 AM Indications: diagnostic evaluation and pain Details: 22 G 1.5 in needle, posterior approach  Arthrogram: No  Medications: 5 mL lidocaine 1 %; 40 mg methylPREDNISolone acetate 40 MG/ML Outcome: tolerated well, no immediate complications Procedure, treatment alternatives, risks and benefits explained, specific risks discussed. Consent was given by the patient. Immediately prior to procedure a time out was called to verify the correct patient, procedure, equipment, support staff and site/side marked as required. Patient was prepped and draped in the usual sterile fashion.      Clinical Data: No additional findings.  ROS:  All other systems negative, except as noted in the HPI. Review of Systems  Objective: Vital Signs: Ht 5\' 5"  (1.651 m)   Wt 231 lb (104.8 kg)   BMI 38.44 kg/m   Specialty Comments:  No specialty comments available.  PMFS History: Patient Active Problem List   Diagnosis Date Noted  . Hyperlipidemia associated with type 2 diabetes mellitus ([place])  11/30/2017  . Impingement syndrome of right shoulder 07/06/2017  . Iron deficiency anemia 05/11/2017  . Morbid obesity ([place]) 04/24/2017  . Acute midline low back pain without sciatica 04/11/2017  . Subacute osteomyelitis, right ankle and foot ([place])   . Anemia 02/24/2017  . Right foot ulcer, limited to breakdown of skin ([place]) 09/26/2016  . Osteomyelitis of toe of left foot ([place])   . Baker's cyst 07/10/2016  . Tachycardia 03/04/2016  . Onychomycosis 12/07/2015  . Non-pressure chronic ulcer of other part of right foot limited to breakdown of skin ([place])  12/07/2015  . Upper airway cough syndrome 03/05/2014  . History of amputation of lesser toe of right foot ([place]) 04/15/2013  . Acute pain of right shoulder 04/15/2013  . Hypertension associated with diabetes ([place]) 12/05/2006  . Uncontrolled type 2 diabetes mellitus with peripheral circulatory disorder ([place]) 10/12/2006  . Hyperlipidemia 10/12/2006  . Allergic rhinitis 10/12/2006  . GERD - Followed by Dr. Fuller Plan 10/12/2006  . IRRITABLE BOWEL SYNDROME - followed by Dr. Fuller Plan 10/12/2006  . Peripheral neuropathy 10/11/2006  . ALOPECIA NEC 10/11/2006   Past Medical History:  Diagnosis Date  . Alopecia   . Anemia, mild   . Arthritis   . Chronic cough    sees pulmonologist  . Chronic pain    chest wall and abd - s/p extensive eval  . Diabetes mellitus with neuropathy ([place])    sees endocrine  . Diverticulosis   . Fatty liver   . GERD (gastroesophageal reflux disease)    takes Nexium bid, hx erosive esophagitis  . Headache(784.0)    occasionally;r/t sinus   . History of colon polyps   . HTN (hypertension)   . Hx of amputation of lesser toe ([place])    sees podiatrist  . Hyperlipemia   . IBS (irritable bowel syndrome)   . Insomnia    takes Elavil nightly  . Joint pain   . Neuropathy   . Osteomyelitis ([place])   . Pneumonia   . PONV (postoperative nausea and vomiting)   . Seasonal allergies    takes Zyrtec daily  . Sinus tachycardia     Family History  Problem Relation Age of Onset  . Lung cancer Mother 23       smoked heavily  . Emphysema Mother   . Hypertension Father   . Hyperlipidemia Father   . Diabetes Father   . Coronary artery disease Father   . Dementia Father   . COPD Father        smoked  . Stomach cancer Paternal Aunt   . Brain cancer Paternal Uncle   . Irritable bowel syndrome Unknown        Several family members on fathers side   . Diabetes Other   . Stomach cancer Maternal Aunt   . Lung cancer Paternal Uncle   . Heart disease Paternal Uncle   . Colon  cancer Neg Hx     Past Surgical History:  Procedure Laterality Date  . AMPUTATION  12/28/2011   Procedure: AMPUTATION DIGIT;  Surgeon: Newt Minion, MD;  Location: [facility];  Service: Orthopedics;  Laterality: Right;  Right Foot 2nd Toe Amputation at MTP (metatarsophalangeal joint)  . AMPUTATION Right 04/25/2012   Procedure: Right Foot 3rd Toe Amputation;  Surgeon: Newt Minion, MD;  Location: [facility];  Service: Orthopedics;  Laterality: Right;  Right Foot Third Toe Amputation   . AMPUTATION Right 07/27/2012   Procedure: Right 4th Toe Amputation at Metatarsophalangeal;  Surgeon: Beverely Low  Fernanda Drum, MD;  Location: [facility];  Service: Orthopedics;  Laterality: Right;  Right 4th Toe Amputation at Metatarsophalangeal  . AMPUTATION Left 07/15/2016   Procedure: Left 2nd Ray Amputation;  Surgeon: Newt Minion, MD;  Location: [facility];  Service: Orthopedics;  Laterality: Left;  . AMPUTATION Left 11/18/2016   Procedure: Left 3rd and 4th Ray Amputation;  Surgeon: Newt Minion, MD;  Location: [facility];  Service: Orthopedics;  Laterality: Left;  . AMPUTATION Right 03/29/2017   Procedure: RIGHT FOOT 3RD AND 4TH RAY AMPUTATION;  Surgeon: Newt Minion, MD;  Location: [facility];  Service: Orthopedics;  Laterality: Right;  . COLONOSCOPY    . LAPAROSCOPIC APPENDECTOMY  01/05/2011   Procedure: APPENDECTOMY LAPAROSCOPIC;  Surgeon: Pedro Earls, MD;  [facility];  Service: General;  Laterality: N/A;  . OOPHORECTOMY  2001  . ROTATOR CUFF REPAIR Right    x 2  . TUBAL LIGATION    . VAGINAL HYSTERECTOMY  2001   Social History   Occupational History  . Occupation: Retired   Tobacco Use  . Smoking status: Never Smoker  . Smokeless tobacco: Never Used  Substance and Sexual Activity  . Alcohol use: No  . Drug use: No  . Sexual activity: Yes    Birth control/protection: Surgical

## 2018-02-15 ENCOUNTER — Ambulatory Visit: Payer: Self-pay | Admitting: *Deleted

## 2018-02-15 MED ORDER — OSELTAMIVIR PHOSPHATE 75 MG PO CAPS: 75.0000 mg | ORAL_CAPSULE | Freq: Every day | ORAL | 0 refills | Status: DC

## 2018-02-15 NOTE — Telephone Encounter (Signed)
Pt's grand daughter was just diagnosed with FLU A and she was at their house last night. She is requesting tami-flu for preventative care. Please advise.   [id number] [place], [place] - [address] [place] [id number]  Patient was exposed to grand daughter with flu- she is high risk with her age and health conditions. Patient is requesting preventative treatment. Reason for Disposition . [1] Influenza EXPOSURE (Close Contact) within last 48 hours (2 days) AND [2] exposed person is HIGH RISK (e.g., age > 101 years, pregnant, HIV+, chronic medical condition)  Answer Assessment - Initial Assessment Questions 1. TYPE of EXPOSURE: "How were you exposed?" (e.g., close contact, not a close contact)     Close contact- granddaughter was at house for 4 hours yesterday. 2. DATE of EXPOSURE: "When did the exposure occur?" (e.g., hour, days, weeks)     Diagnosis today 3. PREGNANCY: "Is there any chance you are pregnant?" "When was your last menstrual period?"     n/a 4. HIGH RISK for COMPLICATIONS: "Do you have any heart or lung problems? Do you have a weakened immune system?" (e.g., CHF, COPD, asthma, HIV positive, chemotherapy, renal failure, diabetes mellitus, sickle cell anemia)     Diabetic, chronic cough 5. SYMPTOMS: "Do you have any symptoms?" (e.g., cough, fever, sore throat, difficulty breathing).     no  Protocols used: INFLUENZA EXPOSURE-A-AH

## 2018-02-15 NOTE — Telephone Encounter (Signed)
Ok please call pt and send tamiflu 75mg  daily for 10 days right away.

## 2018-02-15 NOTE — Addendum Note (Signed)
Addended by: Agnes Lawrence on: 02/15/2018 01:41 PM   Modules accepted: Orders

## 2018-02-15 NOTE — Addendum Note (Signed)
Addended by: Agnes Lawrence on: 02/15/2018 01:46 PM   Modules accepted: Orders

## 2018-02-15 NOTE — Telephone Encounter (Signed)
I called the pt and informed her the Rx was sent to the pharmacy. 

## 2018-02-15 NOTE — Telephone Encounter (Signed)
Please advise 

## 2018-03-14 ENCOUNTER — Ambulatory Visit: Payer: Medicare Other | Admitting: Internal Medicine

## 2018-03-25 ENCOUNTER — Other Ambulatory Visit: Payer: Self-pay | Admitting: Family Medicine

## 2018-04-02 ENCOUNTER — Other Ambulatory Visit: Payer: Self-pay

## 2018-04-02 MED ORDER — METOPROLOL SUCCINATE ER 25 MG PO TB24: 25.0000 mg | ORAL_TABLET | Freq: Every day | ORAL | 2 refills | Status: DC

## 2018-04-12 ENCOUNTER — Telehealth (INDEPENDENT_AMBULATORY_CARE_PROVIDER_SITE_OTHER): Payer: Self-pay | Admitting: Radiology

## 2018-04-12 NOTE — Telephone Encounter (Signed)
Called and spoke with patient, patient answered NO to all pre screening questions for appointment on 4/6 

## 2018-04-16 ENCOUNTER — Ambulatory Visit (INDEPENDENT_AMBULATORY_CARE_PROVIDER_SITE_OTHER): Payer: Medicare Other | Admitting: Orthopedic Surgery

## 2018-04-16 ENCOUNTER — Other Ambulatory Visit: Payer: Self-pay

## 2018-04-16 ENCOUNTER — Other Ambulatory Visit: Payer: Self-pay | Admitting: Family Medicine

## 2018-04-16 ENCOUNTER — Encounter (INDEPENDENT_AMBULATORY_CARE_PROVIDER_SITE_OTHER): Payer: Self-pay | Admitting: Orthopedic Surgery

## 2018-04-16 VITALS — Ht 65.0 in | Wt 231.0 lb

## 2018-04-16 DIAGNOSIS — L97521 Non-pressure chronic ulcer of other part of left foot limited to breakdown of skin: Secondary | ICD-10-CM | POA: Diagnosis not present

## 2018-04-16 DIAGNOSIS — L97511 Non-pressure chronic ulcer of other part of right foot limited to breakdown of skin: Secondary | ICD-10-CM | POA: Diagnosis not present

## 2018-04-16 DIAGNOSIS — M14672 Charcot's joint, left ankle and foot: Secondary | ICD-10-CM | POA: Diagnosis not present

## 2018-04-16 DIAGNOSIS — B351 Tinea unguium: Secondary | ICD-10-CM | POA: Diagnosis not present

## 2018-04-16 DIAGNOSIS — M7541 Impingement syndrome of right shoulder: Secondary | ICD-10-CM

## 2018-04-16 NOTE — Progress Notes (Signed)
Office Visit Note   [name]           Date of Birth: [birth year]           MRN: [id number] Visit Date: 04/16/2018              Requested by: Lucretia Kern, DO [address] [place], [place] [address] PCP: Lucretia Kern, DO  Chief Complaint  Patient presents with  . Right Foot - Routine Post Op    03/29/17 right foot 3&4th ray amp      HPI: Patient is a 73 year old woman who presents for 3 separate issues #1 right shoulder arthritis with rotator cuff pathology with no relief from previous injection.  Patient has undergone arthroscopic debridement in the past as well.  Patient complains of recurrent ulceration of the left foot and thickened discolored onychomycotic nails x4.  Assessment & Plan: Visit Diagnoses:  1. Onychomycosis   2. Impingement syndrome of right shoulder   3. Charcot's joint, left ankle and foot   4. Non-pressure chronic ulcer of other part of right foot limited to breakdown of skin ([place])   5. Ulcer of left foot, limited to breakdown of skin ([place])     Plan: Nails were trimmed x4 ulcers debrided x2 and left foot.  Patient was given a new prescription for extra-depth shoes custom orthotics double upright brace on the left for biotech.  Follow-Up Instructions: Return in about 2 months (around 06/16/2018).   Ortho Exam  Patient is alert, oriented, no adenopathy, well-dressed, normal affect, normal respiratory effort. Examination patient has no active Charcot arthropathy in either foot no redness no cellulitis.  She has thickened discolored onychomycotic nails x4 involving the great toe bilaterally and the little toe bilaterally.  Patient is unable to safely trim the nails on her own and nails were trimmed x4 without complications.  Patient has a stable Charcot rocker-bottom deformity on the left she has 2 Wegner grade 1 ulcers.  After informed consent a 10 blade knife was used to debride the skin and soft tissue back to healthy viable tissue.  The  ulcer over the Charcot rocker-bottom deformity is 2 cm in diameter 3 mm deep and the ulcer laterally is 5 mm in diameter 1 mm deep.  No signs of infection.  Patient has to manually elevate her arm with significant rotator cuff pathology of the right shoulder with arthritis.  Patient states she is not interested in considering any type of shoulder replacement surgery and may require arthroscopic debridement.  Imaging: No results found. No images are attached to the encounter.  Labs: Lab Results  Component Value Date   HGBA1C 6.6 (A) 11/28/2017   HGBA1C 7.0 (A) 07/28/2017   HGBA1C 7.4 (H) 03/29/2017   ESRSEDRATE 36 (H) 02/24/2017   CRP 1.2 (H) 02/24/2017   LABURIC 5.5 09/01/2015   LABORGA GROUP B STREP (S.AGALACTIAE) ISOLATED 12/26/2014     Lab Results  Component Value Date   ALBUMIN 3.8 05/19/2017   ALBUMIN 3.6 04/21/2017   ALBUMIN 3.6 02/24/2017   PREALBUMIN 19.4 03/05/2012   LABURIC 5.5 09/01/2015    Body mass index is 38.44 kg/m.  Orders:  No orders of the defined types were placed in this encounter.  No orders of the defined types were placed in this encounter.    Procedures: No procedures performed  Clinical Data: No additional findings.  ROS:  All other systems negative, except as noted in the HPI. Review of Systems  Objective: Vital  Signs: Ht 5\' 5"  (1.651 m)   Wt 231 lb (104.8 kg)   BMI 38.44 kg/m   Specialty Comments:  No specialty comments available.  PMFS History: Patient Active Problem List   Diagnosis Date Noted  . Hyperlipidemia associated with type 2 diabetes mellitus ([place]) 11/30/2017  . Impingement syndrome of right shoulder 07/06/2017  . Iron deficiency anemia 05/11/2017  . Morbid obesity ([place]) 04/24/2017  . Acute midline low back pain without sciatica 04/11/2017  . Subacute osteomyelitis, right ankle and foot ([place])   . Anemia 02/24/2017  . Right foot ulcer, limited to breakdown of skin ([place]) 09/26/2016  . Osteomyelitis of toe of left  foot ([place])   . Baker's cyst 07/10/2016  . Tachycardia 03/04/2016  . Onychomycosis 12/07/2015  . Non-pressure chronic ulcer of other part of right foot limited to breakdown of skin ([place]) 12/07/2015  . Upper airway cough syndrome 03/05/2014  . History of amputation of lesser toe of right foot ([place]) 04/15/2013  . Acute pain of right shoulder 04/15/2013  . Hypertension associated with diabetes ([place]) 12/05/2006  . Uncontrolled type 2 diabetes mellitus with peripheral circulatory disorder ([place]) 10/12/2006  . Hyperlipidemia 10/12/2006  . Allergic rhinitis 10/12/2006  . GERD - Followed by Dr. Fuller Plan 10/12/2006  . IRRITABLE BOWEL SYNDROME - followed by Dr. Fuller Plan 10/12/2006  . Peripheral neuropathy 10/11/2006  . ALOPECIA NEC 10/11/2006   Past Medical History:  Diagnosis Date  . Alopecia   . Anemia, mild   . Arthritis   . Chronic cough    sees pulmonologist  . Chronic pain    chest wall and abd - s/p extensive eval  . Diabetes mellitus with neuropathy ([place])    sees endocrine  . Diverticulosis   . Fatty liver   . GERD (gastroesophageal reflux disease)    takes Nexium bid, hx erosive esophagitis  . Headache(784.0)    occasionally;r/t sinus   . History of colon polyps   . HTN (hypertension)   . Hx of amputation of lesser toe ([place])    sees podiatrist  . Hyperlipemia   . IBS (irritable bowel syndrome)   . Insomnia    takes Elavil nightly  . Joint pain   . Neuropathy   . Osteomyelitis ([place])   . Pneumonia   . PONV (postoperative nausea and vomiting)   . Seasonal allergies    takes Zyrtec daily  . Sinus tachycardia     Family History  Problem Relation Age of Onset  . Lung cancer Mother 34       smoked heavily  . Emphysema Mother   . Hypertension Father   . Hyperlipidemia Father   . Diabetes Father   . Coronary artery disease Father   . Dementia Father   . COPD Father        smoked  . Stomach cancer Paternal Aunt   . Brain cancer Paternal Uncle   . Irritable bowel syndrome  Unknown        Several family members on fathers side   . Diabetes Other   . Stomach cancer Maternal Aunt   . Lung cancer Paternal Uncle   . Heart disease Paternal Uncle   . Colon cancer Neg Hx     Past Surgical History:  Procedure Laterality Date  . AMPUTATION  12/28/2011   Procedure: AMPUTATION DIGIT;  Surgeon: Newt Minion, MD;  Location: [facility];  Service: Orthopedics;  Laterality: Right;  Right Foot 2nd Toe Amputation at MTP (metatarsophalangeal joint)  . AMPUTATION Right  04/25/2012   Procedure: Right Foot 3rd Toe Amputation;  Surgeon: Newt Minion, MD;  Location: [facility];  Service: Orthopedics;  Laterality: Right;  Right Foot Third Toe Amputation   . AMPUTATION Right 07/27/2012   Procedure: Right 4th Toe Amputation at Metatarsophalangeal;  Surgeon: Newt Minion, MD;  Location: [facility];  Service: Orthopedics;  Laterality: Right;  Right 4th Toe Amputation at Metatarsophalangeal  . AMPUTATION Left 07/15/2016   Procedure: Left 2nd Ray Amputation;  Surgeon: Newt Minion, MD;  Location: [facility];  Service: Orthopedics;  Laterality: Left;  . AMPUTATION Left 11/18/2016   Procedure: Left 3rd and 4th Ray Amputation;  Surgeon: Newt Minion, MD;  Location: [facility];  Service: Orthopedics;  Laterality: Left;  . AMPUTATION Right 03/29/2017   Procedure: RIGHT FOOT 3RD AND 4TH RAY AMPUTATION;  Surgeon: Newt Minion, MD;  Location: [facility];  Service: Orthopedics;  Laterality: Right;  . COLONOSCOPY    . LAPAROSCOPIC APPENDECTOMY  01/05/2011   Procedure: APPENDECTOMY LAPAROSCOPIC;  Surgeon: Pedro Earls, MD;  [facility];  Service: General;  Laterality: N/A;  . OOPHORECTOMY  2001  . ROTATOR CUFF REPAIR Right    x 2  . TUBAL LIGATION    . VAGINAL HYSTERECTOMY  2001   Social History   Occupational History  . Occupation: Retired   Tobacco Use  . Smoking status: Never Smoker  . Smokeless tobacco: Never Used  Substance and Sexual Activity  . Alcohol use: No  . Drug use: No  . Sexual activity:  Yes    Birth control/protection: Surgical

## 2018-04-23 ENCOUNTER — Encounter: Payer: Medicare Other | Admitting: Family Medicine

## 2018-04-30 ENCOUNTER — Telehealth: Payer: Self-pay | Admitting: Gastroenterology

## 2018-04-30 ENCOUNTER — Other Ambulatory Visit: Payer: Self-pay

## 2018-04-30 MED ORDER — OMEPRAZOLE 40 MG PO CPDR: 80.0000 mg | DELAYED_RELEASE_CAPSULE | Freq: Two times a day (BID) | ORAL | 0 refills | Status: DC

## 2018-04-30 MED ORDER — METFORMIN HCL 1000 MG PO TABS: ORAL_TABLET | ORAL | 1 refills | Status: DC

## 2018-04-30 MED ORDER — GABAPENTIN 100 MG PO CAPS: ORAL_CAPSULE | ORAL | 1 refills | Status: DC

## 2018-04-30 NOTE — Telephone Encounter (Signed)
Prescription sent to [facility]. Patient informed prescription has been sent and patient is due for follow up visit. Patient verbalized understand and will call back to schedule a virtual visit.

## 2018-05-05 ENCOUNTER — Other Ambulatory Visit: Payer: Self-pay | Admitting: Internal Medicine

## 2018-05-11 ENCOUNTER — Encounter: Payer: Self-pay | Admitting: Family Medicine

## 2018-05-14 ENCOUNTER — Encounter: Payer: Self-pay | Admitting: Family Medicine

## 2018-05-14 ENCOUNTER — Ambulatory Visit (INDEPENDENT_AMBULATORY_CARE_PROVIDER_SITE_OTHER): Payer: Medicare Other | Admitting: Family Medicine

## 2018-05-14 VITALS — BP 127/67 | HR 74 | Temp 98.0°F | Wt 230.2 lb

## 2018-05-14 DIAGNOSIS — K219 Gastro-esophageal reflux disease without esophagitis: Secondary | ICD-10-CM | POA: Diagnosis not present

## 2018-05-14 DIAGNOSIS — G63 Polyneuropathy in diseases classified elsewhere: Secondary | ICD-10-CM

## 2018-05-14 DIAGNOSIS — R05 Cough: Secondary | ICD-10-CM

## 2018-05-14 DIAGNOSIS — E785 Hyperlipidemia, unspecified: Secondary | ICD-10-CM

## 2018-05-14 DIAGNOSIS — E1169 Type 2 diabetes mellitus with other specified complication: Secondary | ICD-10-CM

## 2018-05-14 DIAGNOSIS — R058 Other specified cough: Secondary | ICD-10-CM

## 2018-05-14 DIAGNOSIS — E1159 Type 2 diabetes mellitus with other circulatory complications: Secondary | ICD-10-CM

## 2018-05-14 DIAGNOSIS — I10 Essential (primary) hypertension: Secondary | ICD-10-CM

## 2018-05-14 DIAGNOSIS — J309 Allergic rhinitis, unspecified: Secondary | ICD-10-CM | POA: Diagnosis not present

## 2018-05-14 DIAGNOSIS — I152 Hypertension secondary to endocrine disorders: Secondary | ICD-10-CM

## 2018-05-14 MED ORDER — FEXOFENADINE HCL 180 MG PO TABS: 180.0000 mg | ORAL_TABLET | Freq: Every day | ORAL | 3 refills | Status: DC

## 2018-05-14 MED ORDER — HYDROCORTISONE BUTYRATE 0.1 % EX SOLN: CUTANEOUS | 0 refills | Status: DC

## 2018-05-14 NOTE — Progress Notes (Signed)
    Chief Complaint:  Beth Holden is a 73 y.o. female who presents today for a virtual office visit with a chief complaint of seasonal allergies and to transfer care to this office.   Assessment/Plan:  Upper airway cough syndrome May have underlying allergic component.  We will switch from Zyrtec to [place].  Consider trial of ICS if no improvement.  Peripheral neuropathy Stable.  Continue gabapentin 200 mg daily.  Hypertension associated with diabetes ([place]) Stable.  Continue metoprolol 25 mg daily and irbesartan 75 mg daily.  GERD - Followed by Dr. Haynes Kerns.  Continue omeprazole per GI.  Dyslipidemia associated with type 2 diabetes mellitus ([facility]) Stable.  Continue Crestor 20 mg daily.  Allergic rhinitis We will switch from Zyrtec to [place].  She also has itchy ear canals and has been on topical steroids for this for years.  We will switch from fluocinolone low potency hydrocortisone.     Subjective:  HPI:  # Essential Hypertension - On metoprolol succinate 25 mg daily and irbesartan 75 mg daily.  Tolerating both well without side effects. - ROS: No reported chest pain or shortness of breath.  # Dyslipidemia -On Crestor 20 mg daily.  Tolerating well.  # GERD -Follows with GI - On Prilosec 80 mg twice daily.  Tolerating well.  #Iron deficiency -Gets transfusions at cancer center.  # Seasonal Allergies / Cough -Has been on Zyrtec in the past.  Does not know if it helps. -Uses topical fluocinolone for itchy ears -Has been on flovent in the past for her cough but is not taking anything currently  # Peripheral Neuropathy - On gabapentin 200mg  nightly  % T2DM  - Follows with endocrinology - On insulin and metformin  % History of Diabetic Foot Ulcer s/p multiple amputations - Follows with Dr Sharol Given  ROS: Per HPI  PMH: She reports that she has never smoked. She has never used smokeless tobacco. She reports that she does not drink alcohol or use drugs.       Objective/Observations  Physical Exam: Gen: NAD, resting comfortably Pulm: Normal work of breathing Neuro: Grossly normal, moves all extremities Psych: Normal affect and thought content  Virtual Visit via Video   I connected with Beth Holden on 05/14/18 at  2:00 PM EDT by a video enabled telemedicine application and verified that I am speaking with the correct person using two identifiers. I discussed the limitations of evaluation and management by telemedicine and the availability of in person appointments. The patient expressed understanding and agreed to proceed.   Patient location: Home Provider location: [place] Office Persons participating in the virtual visit: Myself and Patient     Time Spent: I spent >40 minutes face-to-face with the patient, with more than half spent on counseling for management plan for her seasonal allergies, ear pruritus, dyslipidemia, GERD, neuropathy, cough, hypertension.   Algis Greenhouse. Jerline Pain, MD 05/14/2018 4:11 PM

## 2018-05-14 NOTE — Assessment & Plan Note (Signed)
Stable. Continue Crestor 20mg daily

## 2018-05-14 NOTE — Assessment & Plan Note (Signed)
Stable.  Continue omeprazole per GI.

## 2018-05-14 NOTE — Assessment & Plan Note (Signed)
May have underlying allergic component.  We will switch from Zyrtec to [place].  Consider trial of ICS if no improvement.

## 2018-05-14 NOTE — Assessment & Plan Note (Signed)
Stable.  Continue metoprolol 25 mg daily and irbesartan 75 mg daily.

## 2018-05-14 NOTE — Assessment & Plan Note (Signed)
We will switch from Zyrtec to [place].  She also has itchy ear canals and has been on topical steroids for this for years.  We will switch from fluocinolone low potency hydrocortisone.

## 2018-05-14 NOTE — Assessment & Plan Note (Signed)
Stable.  Continue gabapentin 200 mg daily.

## 2018-05-21 ENCOUNTER — Other Ambulatory Visit: Payer: Self-pay

## 2018-05-22 ENCOUNTER — Inpatient Hospital Stay (HOSPITAL_BASED_OUTPATIENT_CLINIC_OR_DEPARTMENT_OTHER): Payer: Medicare Other | Admitting: Oncology

## 2018-05-22 ENCOUNTER — Telehealth: Payer: Self-pay | Admitting: Oncology

## 2018-05-22 ENCOUNTER — Encounter: Payer: Self-pay | Admitting: Gastroenterology

## 2018-05-22 ENCOUNTER — Inpatient Hospital Stay: Payer: Medicare Other | Attending: Oncology

## 2018-05-22 ENCOUNTER — Telehealth: Payer: Self-pay | Admitting: *Deleted

## 2018-05-22 ENCOUNTER — Other Ambulatory Visit: Payer: Self-pay

## 2018-05-22 ENCOUNTER — Ambulatory Visit (INDEPENDENT_AMBULATORY_CARE_PROVIDER_SITE_OTHER): Payer: Medicare Other | Admitting: Gastroenterology

## 2018-05-22 VITALS — Ht 65.0 in | Wt 230.0 lb

## 2018-05-22 VITALS — BP 128/55 | HR 77 | Temp 98.0°F | Resp 17 | Ht 65.0 in | Wt 238.5 lb

## 2018-05-22 DIAGNOSIS — D508 Other iron deficiency anemias: Secondary | ICD-10-CM

## 2018-05-22 DIAGNOSIS — R3129 Other microscopic hematuria: Secondary | ICD-10-CM

## 2018-05-22 DIAGNOSIS — I10 Essential (primary) hypertension: Secondary | ICD-10-CM | POA: Insufficient documentation

## 2018-05-22 DIAGNOSIS — Z79899 Other long term (current) drug therapy: Secondary | ICD-10-CM

## 2018-05-22 DIAGNOSIS — E119 Type 2 diabetes mellitus without complications: Secondary | ICD-10-CM | POA: Insufficient documentation

## 2018-05-22 DIAGNOSIS — D509 Iron deficiency anemia, unspecified: Secondary | ICD-10-CM | POA: Insufficient documentation

## 2018-05-22 DIAGNOSIS — K5900 Constipation, unspecified: Secondary | ICD-10-CM | POA: Diagnosis not present

## 2018-05-22 DIAGNOSIS — D709 Neutropenia, unspecified: Secondary | ICD-10-CM | POA: Diagnosis not present

## 2018-05-22 DIAGNOSIS — K219 Gastro-esophageal reflux disease without esophagitis: Secondary | ICD-10-CM

## 2018-05-22 DIAGNOSIS — R32 Unspecified urinary incontinence: Secondary | ICD-10-CM

## 2018-05-22 DIAGNOSIS — Z86010 Personal history of colonic polyps: Secondary | ICD-10-CM | POA: Diagnosis not present

## 2018-05-22 LAB — CBC WITH DIFFERENTIAL (CANCER CENTER ONLY)
Abs Immature Granulocytes: 0.06 10*3/uL (ref 0.00–0.07)
Basophils Absolute: 0.1 10*3/uL (ref 0.0–0.1)
Basophils Relative: 1 %
Eosinophils Absolute: 0.3 10*3/uL (ref 0.0–0.5)
Eosinophils Relative: 2 %
HCT: 40.4 % (ref 36.0–46.0)
Hemoglobin: 12.4 g/dL (ref 12.0–15.0)
Immature Granulocytes: 1 %
Lymphocytes Relative: 19 %
Lymphs Abs: 2.3 10*3/uL (ref 0.7–4.0)
MCH: 27.3 pg (ref 26.0–34.0)
MCHC: 30.7 g/dL (ref 30.0–36.0)
MCV: 89 fL (ref 80.0–100.0)
Monocytes Absolute: 0.6 10*3/uL (ref 0.1–1.0)
Monocytes Relative: 5 %
Neutro Abs: 8.4 10*3/uL — ABNORMAL HIGH (ref 1.7–7.7)
Neutrophils Relative %: 72 %
Platelet Count: 361 10*3/uL (ref 150–400)
RBC: 4.54 MIL/uL (ref 3.87–5.11)
RDW: 13 % (ref 11.5–15.5)
WBC Count: 11.6 10*3/uL — ABNORMAL HIGH (ref 4.0–10.5)
nRBC: 0 % (ref 0.0–0.2)

## 2018-05-22 LAB — URINALYSIS, COMPLETE (UACMP) WITH MICROSCOPIC
Bilirubin Urine: NEGATIVE
Glucose, UA: NEGATIVE mg/dL
Ketones, ur: NEGATIVE mg/dL
Nitrite: NEGATIVE
Protein, ur: NEGATIVE mg/dL
Specific Gravity, Urine: 1.017 (ref 1.005–1.030)
pH: 5 (ref 5.0–8.0)

## 2018-05-22 MED ORDER — OMEPRAZOLE 40 MG PO CPDR: 80.0000 mg | DELAYED_RELEASE_CAPSULE | Freq: Two times a day (BID) | ORAL | 3 refills | Status: DC

## 2018-05-22 NOTE — Patient Instructions (Signed)
We have sent the following prescriptions to your mail in pharmacy: omeprazole.    If you have not heard from your mail in pharmacy within 1 week or if you have not received your medication in the mail, please contact us at [phone] so we may find out why.  Patient advised to avoid spicy, acidic, citrus, chocolate, mints, fruit and fruit juices.  Limit the intake of caffeine, alcohol and Soda.  Don't exercise too soon after eating.  Don't lie down within 3-4 hours of eating.  Elevate the head of your bed.   Continue Miralax as needed and dulcolax as needed.   Continue high fiber diet and increased water intake.   Thank you for choosing me and [facility].  Pricilla Riffle. Dagoberto Ligas., MD., [facility]

## 2018-05-22 NOTE — Telephone Encounter (Signed)
Scheduled appt per 5/12 los. ° °A calendar will be mailed out. °

## 2018-05-22 NOTE — Progress Notes (Signed)
    History of Present Illness: This is a 73 year old female with GERD and intermittent constipation.  She has been followed by Dr. Benay Spice for management of iron deficiency anemia and after several iron infusions her CBC is normal today.  Ferritin from today is pending.  Her reflux symptoms have been under excellent control on omeprazole 40 mg twice daily.  Intermittent constipation is controlled with as needed MiraLAX and Dulcolax.  Colonoscopy and EGD were performed in February 2019.  Current Medications, Allergies, Past Medical History, Past Surgical History, Family History and Social History were reviewed in [employer] record.   Physical Exam: Telemedicine - not performed   Assessment and Recommendations:  1. GERD.  Continue omeprazole 40 mg twice daily, refill for 1 year.  Follow standard antireflux measures. REV in 1 year.   2.  Intermittent constipation.  Continue MiraLAX daily as needed and Dulcolax daily as needed if MiraLAX is not sufficient.  Follow a high-fiber diet with adequate daily water intake.  3.  Iron deficiency anemia.  Currently corrected.  Continue follow-up with Dr. Benay Spice.  4.  Personal history of adenomatous colon polyps.  5-year interval surveillance colonoscopy is recommended in February 2024.    These services were provided via telemedicine, audio only per patient request.  The patient was at home and the provider was in the office, alone.  We discussed the limitations of evaluation and management by telemedicine and the availability of in person appointments.  Patient consented for this telemedicine visit and is aware of possible charges for this service.  The other person participating in the telemedicine service was Marlon Pel, CMA who reviewed medications, allergies, past history and completed AVS.  Time spent on call: 9 minutes

## 2018-05-22 NOTE — Progress Notes (Signed)
[facility] OFFICE PROGRESS NOTE   Diagnosis: Iron deficiency anemia   INTERVAL HISTORY:   Beth Holden returns as scheduled.  She feels well.  She denies bleeding.  She is taking iron daily.  She has intermittent nausea in the mornings, no emesis.  She has urinary incontinence and plans to schedule appointment with Dr. Junious Silk. No fever.  She is not taking steroids consistently.  She uses a steroid cream for a rash.  She has been referred to dermatology to evaluate a rash at the dorsum of the right foot.  Objective:  Vital signs in last 24 hours:  Blood pressure (!) 128/55, pulse 77, temperature 98 F (36.7 C), temperature source Oral, resp. rate 17, height 5\' 5"  (1.651 m), weight 238 lb 8 oz (108.2 kg), SpO2 98 %.    GI: No hepatosplenomegaly, nontender, no mass Vascular: No leg edema Skin: Crusted 3 to 4 cm raised nodular plaque at the dorsum of the right foot, less than 1 cm round dark brown regular mole at the right upper back    Lab Results:  Lab Results  Component Value Date   WBC 11.6 (H) 05/22/2018   HGB 12.4 05/22/2018   HCT 40.4 05/22/2018   MCV 89.0 05/22/2018   PLT 361 05/22/2018   NEUTROABS 8.4 (H) 05/22/2018    CMP  Lab Results  Component Value Date   NA 138 05/19/2017   K 3.9 05/19/2017   CL 101 05/19/2017   CO2 28 05/19/2017   GLUCOSE 132 05/19/2017   BUN 14 05/19/2017   CREATININE 0.88 05/19/2017   CALCIUM 9.8 05/19/2017   PROT 7.6 05/19/2017   ALBUMIN 3.8 05/19/2017   AST 20 05/19/2017   ALT 20 05/19/2017   ALKPHOS 65 05/19/2017   BILITOT 0.3 05/19/2017   GFRNONAA >60 05/19/2017   GFRAA >60 05/19/2017    No results found for: CEA1   Medications: I have reviewed the patient's current medications.   Assessment/Plan: 1. Anemia secondary to iron deficiency, possible GI blood loss related to gastritis status post IV ferrous gluconate weekly x4 beginning 03/13/2017, single dose of IV ferrous gluconate 05/22/2017.   Hemoglobin/MCV corrected into normal range. 2. Upper endoscopy 02/15/2017-diffuse moderate inflammation characterized by congestion, erythema, friability and granularity in the gastric body, posterior wall of the stomach and gastric antrum.  Biopsy GASTRIC ANTRAL MUCOSA WITH NON-SPECIFIC REACTIVE GASTROPATHY AND FEW SUBEPITHELIAL CRYSTALLINE IRON DEPOSITS, CONSISTENT WITH IRON PILL GASTROPATHY. GASTRIC OXYNTIC MUCOSA WITH PARIETAL CELL HYPERPLASIA AS CAN BE SEEN IN HYPERGASTRINEMIC STATES SUCH AS PPI THERAPY. WARTHIN-STARRY STAIN IS NEGATIVE FOR HELICOBACTER PYLORI 3. Colonoscopy 02/15/2017-6 mm polyp in the transverse colon (TUBULAR ADENOMA. NEGATIVE FOR HIGH GRADE DYSPLASIA OR MALIGNANCY) 4. Diabetes 5. Hypertension 6. 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 Small hemoglobin on repeat urinalyses     Disposition: Ms. Grange appears stable.  The hemoglobin is in the normal range.  She will continue iron.  I will refer her to urology to evaluate the urinary incontinence and history of microscopic hematuria.  She will continue iron.  She will return for an office visit and CBC/ferritin in 6 months. I encouraged her to follow-up with dermatology to evaluate the dark mole at the upper back and skin changes at the right foot.  The mild neutropenia is chronic and likely a benign finding.  I have a low clinical suspicion for a myeloproliferative disorder.  Betsy Coder, MD  05/22/2018  9:08 AM

## 2018-05-22 NOTE — Telephone Encounter (Signed)
-----   Message from Ladell Pier, MD sent at 05/22/2018  4:26 PM EDT ----- Please call patient, urine still shows a small amount of microscopic blood, follow-up with Dr. Junious Silk for evaluation

## 2018-05-22 NOTE — Telephone Encounter (Signed)
Notified of UA results. She already has an appointment with Dr. Junious Silk.

## 2018-05-23 DIAGNOSIS — D2371 Other benign neoplasm of skin of right lower limb, including hip: Secondary | ICD-10-CM | POA: Diagnosis not present

## 2018-05-23 DIAGNOSIS — D485 Neoplasm of uncertain behavior of skin: Secondary | ICD-10-CM | POA: Diagnosis not present

## 2018-05-23 DIAGNOSIS — L723 Sebaceous cyst: Secondary | ICD-10-CM | POA: Diagnosis not present

## 2018-05-23 DIAGNOSIS — L300 Nummular dermatitis: Secondary | ICD-10-CM | POA: Diagnosis not present

## 2018-05-23 DIAGNOSIS — L821 Other seborrheic keratosis: Secondary | ICD-10-CM | POA: Diagnosis not present

## 2018-05-28 ENCOUNTER — Encounter: Payer: Self-pay | Admitting: Nurse Practitioner

## 2018-06-06 DIAGNOSIS — R311 Benign essential microscopic hematuria: Secondary | ICD-10-CM | POA: Diagnosis not present

## 2018-06-06 DIAGNOSIS — R3915 Urgency of urination: Secondary | ICD-10-CM | POA: Diagnosis not present

## 2018-06-21 ENCOUNTER — Other Ambulatory Visit: Payer: Self-pay

## 2018-06-21 ENCOUNTER — Ambulatory Visit (INDEPENDENT_AMBULATORY_CARE_PROVIDER_SITE_OTHER): Payer: Medicare Other | Admitting: Orthopedic Surgery

## 2018-06-21 ENCOUNTER — Encounter: Payer: Self-pay | Admitting: Orthopedic Surgery

## 2018-06-21 VITALS — Ht 65.0 in | Wt 230.0 lb

## 2018-06-21 DIAGNOSIS — M14672 Charcot's joint, left ankle and foot: Secondary | ICD-10-CM

## 2018-06-21 DIAGNOSIS — B351 Tinea unguium: Secondary | ICD-10-CM | POA: Diagnosis not present

## 2018-06-21 DIAGNOSIS — Z89422 Acquired absence of other left toe(s): Secondary | ICD-10-CM

## 2018-06-21 DIAGNOSIS — L97521 Non-pressure chronic ulcer of other part of left foot limited to breakdown of skin: Secondary | ICD-10-CM

## 2018-06-21 NOTE — Progress Notes (Signed)
Office Visit Note   [name]           Date of Birth: 02-20-1945           MRN: [id number] Visit Date: 06/21/2018              Requested by: Lucretia Kern, DO [address] [place],  [place] [address] PCP: Vivi Barrack, MD  Chief Complaint  Patient presents with   Left Foot - Follow-up    Nail trim    Right Foot - Follow-up      HPI: Patient is a 73 year old woman who presents in follow-up for both feet.  She states she has progressive Charcot deformity of the left foot with increasing valgus deformity and impingement laterally she does have double upright braces on the left.  She states she has persistent pain from the ulcer beneath the left first metatarsal head and the left Charcot rocker-bottom deformity.  She also complains of painful onychomycotic nails x4.  Assessment & Plan: Visit Diagnoses:  1. Onychomycosis   2. Charcot's joint, left ankle and foot   3. Ulcer of left foot, limited to breakdown of skin (HCC)     Plan: Nails were trimmed x4 ulcers were debrided x2 discussed the nature of the Charcot arthropathy with the progressive deformity.  We will continue with conservative treatment.  Follow-Up Instructions: Return in about 3 months (around 09/21/2018).   Ortho Exam  Patient is alert, oriented, no adenopathy, well-dressed, normal affect, normal respiratory effort. Examination patient has impingement laterally with progressive valgus deformity of the hindfoot with increasing rocker-bottom deformity of the left foot.  She has a Medical illustrator grade 1 ulcer beneath the rocker-bottom deformity.  After informed consent a 10 blade knife was used to debride the skin and soft tissue back to healthy viable tissue.  The ulcer is 2 cm in diameter 1 mm deep.  She also has a Medical illustrator grade 1 ulcer beneath the first metatarsal head this was also debrided with a 10 blade knife this is 10 mm in diameter 1 mm deep.  There are no ulcers on the right foot.  She has  thickened discolored onychomycotic nails x4 she is unable to safely trim these on her own and the nails were trimmed x4 without complications.  Imaging: No results found. No images are attached to the encounter.  Labs: Lab Results  Component Value Date   HGBA1C 6.6 (A) 11/28/2017   HGBA1C 7.0 (A) 07/28/2017   HGBA1C 7.4 (H) 03/29/2017   ESRSEDRATE 36 (H) 02/24/2017   CRP 1.2 (H) 02/24/2017   LABURIC 5.5 09/01/2015   LABORGA GROUP B STREP (S.AGALACTIAE) ISOLATED 12/26/2014     Lab Results  Component Value Date   ALBUMIN 3.8 05/19/2017   ALBUMIN 3.6 04/21/2017   ALBUMIN 3.6 02/24/2017   PREALBUMIN 19.4 03/05/2012   LABURIC 5.5 09/01/2015    Body mass index is 38.27 kg/m.  Orders:  No orders of the defined types were placed in this encounter.  No orders of the defined types were placed in this encounter.    Procedures: No procedures performed  Clinical Data: No additional findings.  ROS:  All other systems negative, except as noted in the HPI. Review of Systems  Objective: Vital Signs: Ht 5\' 5"  (1.651 m)    Wt 230 lb (104.3 kg)    BMI 38.27 kg/m   Specialty Comments:  No specialty comments available.  PMFS History: Patient Active Problem List   Diagnosis Date  Noted   Hyperlipidemia associated with type 2 diabetes mellitus ([facility]) 11/30/2017   Impingement syndrome of right shoulder 07/06/2017   Iron deficiency anemia 05/11/2017   Morbid obesity ([place]) 04/24/2017   Anemia 02/24/2017   Baker's cyst 07/10/2016   Onychomycosis 12/07/2015   Upper airway cough syndrome 03/05/2014   History of amputation of lesser toe of right foot ([place]) 04/15/2013   Hypertension associated with diabetes ([facility]) 12/05/2006   Uncontrolled type 2 diabetes mellitus with peripheral circulatory disorder ([place]) 10/12/2006   Dyslipidemia associated with type 2 diabetes mellitus ([place]) 10/12/2006   Allergic rhinitis 10/12/2006   GERD - Followed by Dr. Fuller Plan 10/12/2006    IRRITABLE BOWEL SYNDROME - followed by Dr. Fuller Plan 10/12/2006   Peripheral neuropathy 10/11/2006   ALOPECIA NEC 10/11/2006   Past Medical History:  Diagnosis Date   Alopecia    Anemia, mild    Arthritis    Chronic cough    sees pulmonologist   Chronic pain    chest wall and abd - s/p extensive eval   Diabetes mellitus with neuropathy (HCC)    sees endocrine   Diverticulosis    Fatty liver    GERD (gastroesophageal reflux disease)    takes Nexium bid, hx erosive esophagitis   Headache(784.0)    occasionally;r/t sinus    History of colon polyps    HTN (hypertension)    Hx of amputation of lesser toe (HCC)    sees podiatrist   Hyperlipemia    IBS (irritable bowel syndrome)    Insomnia    takes Elavil nightly   Joint pain    Neuropathy    Osteomyelitis (HCC)    Pneumonia    PONV (postoperative nausea and vomiting)    Seasonal allergies    takes Zyrtec daily   Sinus tachycardia     Family History  Problem Relation Age of Onset   Lung cancer Mother 77       smoked heavily   Emphysema Mother    Hypertension Father    Hyperlipidemia Father    Diabetes Father    Coronary artery disease Father    Dementia Father    COPD Father        smoked   Stomach cancer Paternal Aunt    Brain cancer Paternal Uncle    Irritable bowel syndrome Other        Several family members on fathers side    Diabetes Other    Stomach cancer Maternal Aunt    Lung cancer Paternal Uncle    Heart disease Paternal Uncle    Colon cancer Neg Hx     Past Surgical History:  Procedure Laterality Date   AMPUTATION  12/28/2011   Procedure: AMPUTATION DIGIT;  Surgeon: Newt Minion, MD;  Location: [facility];  Service: Orthopedics;  Laterality: Right;  Right Foot 2nd Toe Amputation at MTP (metatarsophalangeal joint)   AMPUTATION Right 04/25/2012   Procedure: Right Foot 3rd Toe Amputation;  Surgeon: Newt Minion, MD;  Location: [facility];  Service: Orthopedics;   Laterality: Right;  Right Foot Third Toe Amputation    AMPUTATION Right 07/27/2012   Procedure: Right 4th Toe Amputation at Metatarsophalangeal;  Surgeon: Newt Minion, MD;  Location: [facility];  Service: Orthopedics;  Laterality: Right;  Right 4th Toe Amputation at Metatarsophalangeal   AMPUTATION Left 07/15/2016   Procedure: Left 2nd Ray Amputation;  Surgeon: Newt Minion, MD;  Location: [facility];  Service: Orthopedics;  Laterality: Left;   AMPUTATION Left 11/18/2016  Procedure: Left 3rd and 4th Ray Amputation;  Surgeon: Newt Minion, MD;  Location: [facility];  Service: Orthopedics;  Laterality: Left;   AMPUTATION Right 03/29/2017   Procedure: RIGHT FOOT 3RD AND 4TH RAY AMPUTATION;  Surgeon: Newt Minion, MD;  Location: [facility];  Service: Orthopedics;  Laterality: Right;   COLONOSCOPY     LAPAROSCOPIC APPENDECTOMY  01/05/2011   Procedure: APPENDECTOMY LAPAROSCOPIC;  Surgeon: Pedro Earls, MD;  Location: [facility];  Service: General;  Laterality: N/A;   OOPHORECTOMY  2001   ROTATOR CUFF REPAIR Right    x 2   TUBAL LIGATION     VAGINAL HYSTERECTOMY  2001   Social History   Occupational History   Occupation: Retired   Tobacco Use   Smoking status: Never Smoker   Smokeless tobacco: Never Used  Substance and Sexual Activity   Alcohol use: No   Drug use: No   Sexual activity: Yes    Birth control/protection: Surgical

## 2018-06-25 ENCOUNTER — Other Ambulatory Visit: Payer: Self-pay

## 2018-06-25 MED ORDER — METFORMIN HCL 1000 MG PO TABS: ORAL_TABLET | ORAL | 1 refills | Status: DC

## 2018-06-28 ENCOUNTER — Encounter: Payer: Self-pay | Admitting: Family Medicine

## 2018-06-28 DIAGNOSIS — R319 Hematuria, unspecified: Secondary | ICD-10-CM | POA: Diagnosis not present

## 2018-06-28 DIAGNOSIS — K760 Fatty (change of) liver, not elsewhere classified: Secondary | ICD-10-CM | POA: Diagnosis not present

## 2018-06-28 DIAGNOSIS — R311 Benign essential microscopic hematuria: Secondary | ICD-10-CM | POA: Diagnosis not present

## 2018-06-28 DIAGNOSIS — I700 Atherosclerosis of aorta: Secondary | ICD-10-CM | POA: Diagnosis not present

## 2018-06-28 DIAGNOSIS — M545 Low back pain: Secondary | ICD-10-CM | POA: Diagnosis not present

## 2018-07-02 ENCOUNTER — Encounter: Payer: Self-pay | Admitting: Family Medicine

## 2018-07-04 ENCOUNTER — Other Ambulatory Visit: Payer: Self-pay

## 2018-07-04 DIAGNOSIS — M549 Dorsalgia, unspecified: Secondary | ICD-10-CM

## 2018-07-06 DIAGNOSIS — M4726 Other spondylosis with radiculopathy, lumbar region: Secondary | ICD-10-CM | POA: Diagnosis not present

## 2018-07-06 DIAGNOSIS — G8929 Other chronic pain: Secondary | ICD-10-CM | POA: Diagnosis not present

## 2018-07-06 DIAGNOSIS — I10 Essential (primary) hypertension: Secondary | ICD-10-CM | POA: Diagnosis not present

## 2018-07-06 DIAGNOSIS — M549 Dorsalgia, unspecified: Secondary | ICD-10-CM | POA: Diagnosis not present

## 2018-07-06 DIAGNOSIS — Z6838 Body mass index (BMI) 38.0-38.9, adult: Secondary | ICD-10-CM | POA: Diagnosis not present

## 2018-07-06 DIAGNOSIS — M4310 Spondylolisthesis, site unspecified: Secondary | ICD-10-CM | POA: Diagnosis not present

## 2018-07-06 DIAGNOSIS — M5136 Other intervertebral disc degeneration, lumbar region: Secondary | ICD-10-CM | POA: Diagnosis not present

## 2018-07-06 DIAGNOSIS — M545 Low back pain: Secondary | ICD-10-CM | POA: Diagnosis not present

## 2018-07-06 DIAGNOSIS — Q762 Congenital spondylolisthesis: Secondary | ICD-10-CM | POA: Diagnosis not present

## 2018-07-06 DIAGNOSIS — M4300 Spondylolysis, site unspecified: Secondary | ICD-10-CM | POA: Diagnosis not present

## 2018-07-06 DIAGNOSIS — M546 Pain in thoracic spine: Secondary | ICD-10-CM | POA: Diagnosis not present

## 2018-07-09 ENCOUNTER — Other Ambulatory Visit: Payer: Self-pay | Admitting: Internal Medicine

## 2018-07-10 ENCOUNTER — Other Ambulatory Visit: Payer: Self-pay

## 2018-07-10 ENCOUNTER — Encounter: Payer: Self-pay | Admitting: Internal Medicine

## 2018-07-10 ENCOUNTER — Ambulatory Visit (INDEPENDENT_AMBULATORY_CARE_PROVIDER_SITE_OTHER): Payer: Medicare Other | Admitting: Internal Medicine

## 2018-07-10 VITALS — BP 120/60 | HR 70 | Ht 65.0 in | Wt 232.0 lb

## 2018-07-10 DIAGNOSIS — E785 Hyperlipidemia, unspecified: Secondary | ICD-10-CM | POA: Diagnosis not present

## 2018-07-10 DIAGNOSIS — E1165 Type 2 diabetes mellitus with hyperglycemia: Secondary | ICD-10-CM

## 2018-07-10 DIAGNOSIS — E1169 Type 2 diabetes mellitus with other specified complication: Secondary | ICD-10-CM | POA: Diagnosis not present

## 2018-07-10 DIAGNOSIS — E1151 Type 2 diabetes mellitus with diabetic peripheral angiopathy without gangrene: Secondary | ICD-10-CM | POA: Diagnosis not present

## 2018-07-10 DIAGNOSIS — G63 Polyneuropathy in diseases classified elsewhere: Secondary | ICD-10-CM

## 2018-07-10 DIAGNOSIS — IMO0002 Reserved for concepts with insufficient information to code with codable children: Secondary | ICD-10-CM

## 2018-07-10 LAB — POCT GLYCOSYLATED HEMOGLOBIN (HGB A1C): Hemoglobin A1C: 7.4 % — AB (ref 4.0–5.6)

## 2018-07-10 MED ORDER — GLUCAGON HCL (RDNA) 1 MG IJ SOLR: 1.0000 mg | Freq: Once | INTRAMUSCULAR | 11 refills | Status: AC | PRN

## 2018-07-10 NOTE — Patient Instructions (Addendum)
Please continue: - Metformin 1000 mg 2x a day  Insulin Before breakfast Before lunch Before dinner At bedtime  Regular 20 to 25 units 5 to 10 units 10 to 15 units   NPH 20 x x 15 >> 10   Try to have more structured meals - spaced by 5 hours.  Obtain the glucagon kit from the pharmacy.  Please return in 4 months with your sugar log

## 2018-07-10 NOTE — Progress Notes (Signed)
Subjective:     Patient ID: Beth Holden, female   DOB: 07-18-1945, 73 y.o.   MRN: [id number]  HPI Beth Holden is a pleasant 73 y.o. woman returning for f/u for DM2, dx ~2000, uncontrolled, insulin-dependent, with complications (diabetic peripheral neuropathy, multiple toe amputations). Last visit 7 months ago.  She had multiple courses of steroids since last OV, not in last 2 mo.   Sugars are very variable, as are her meals.   Last HbA1C was: Lab Results  Component Value Date   HGBA1C 6.6 (A) 11/28/2017   HGBA1C 7.0 (A) 07/28/2017   HGBA1C 7.4 (H) 03/29/2017   She is now on: - Metformin 1000 mg 2x a day Insulin Before breakfast Before lunch Before dinner At bedtime  Regular 20 to 25 units 5 to 10 units 10 to 15 units   NPH 20 x x 15   She also tried: Levemir 47 units in am and 37 units in pm >> $800 for 3 mo supply Januvia 100 mg daily - $450 for 3 mo Invokana 100 mg - $455 for 3 mo Took Byetta before We discussed about starting her on a VGo mechanical pump in the past. She had an appointment with diabetes education but decided not to pursue it.He also tried a sliding scale of regular insulin but she was not using it. We tried Trulicity but she could not tolerate it due to nausea, diarrhea, constipation, weakness.  She checks her sugars 3 times a day: - am: 109-206, 254 >> 76-188, 343 >> 89-184, 203, 215 - forgot insulin - 2h after b'fast :184, 190, 307 >> 111-181 >> 132-242 >> 105-208 - prelunch: 110-214 >> n/c >> 70-196 >> 164, 236 - 2-3h after lunch: 113-200 >> 110-234, 255 >> 61-351 - before dinner:  66, 110, 261 >> 61-177 >> 108-170, 277 - after dinner: 123-251, 281  >> 148-249 >> 177, 186 - bedtime: 100-171, 215 >> 113-248 >> 134-224, 340 - nighttime: 51, 55-152, 179, 282 >> 47-153, 193, 303 >> 70-111, 220 Lowest: 47 >> 48.  Has hypoglycemia awareness in the 70s. Highest: 405 (Prednisone) >> 340, steroids:  Meals: - Breakfast: egg, bacon, toast; grits; biscuit;  fruit; sometimes skips - Lunch: 1/2 PB sandwich or soup and yoghurt, sometimes crackers - Dinner: meat + vegetables + some starch - Snacks: fruit   -No CKD: BUN  Date Value Ref Range Status  05/19/2017 14 7 - 26 mg/dL Final   Creatinine  Date Value Ref Range Status  05/19/2017 0.88 0.60 - 1.10 mg/dL Final  On enalapril.  -+ HL; latest lipid panel: Lab Results  Component Value Date   CHOL 184 11/30/2017   HDL 48.40 11/30/2017   LDLCALC 111 (H) 11/30/2017   TRIG 122.0 11/30/2017   CHOLHDL 4 11/30/2017  On Mevacor. - last eye exam in 09/2017: + Mild TR, no glaucoma (Dr. Zadie Rhine). -+ Numbness and tingling in feet-off amitriptyline, continues on Neurontin  Review of Systems  Constitutional: no weight gain/no weight loss, no fatigue, no subjective hyperthermia, no subjective hypothermia, + nocturia Eyes: no blurry vision, no xerophthalmia ENT: no sore throat, no nodules palpated in neck, no dysphagia, no odynophagia, no hoarseness Cardiovascular: no CP/+ SOB/no palpitations/no leg swelling Respiratory: no cough/+ SOB/no wheezing Gastrointestinal: no N/no V/no D/no C/no acid reflux Musculoskeletal: no muscle aches/no joint aches Skin: no rashes, no hair loss Neurological: no tremors/+ numbness/+ tingling/no dizziness  I reviewed pt's medications, allergies, PMH, social hx, family hx, and changes were documented in the history of  present illness. Otherwise, unchanged from my initial visit note.  Past Medical History:  Diagnosis Date  . Alopecia   . Anemia, mild   . Arthritis   . Chronic cough    sees pulmonologist  . Chronic pain    chest wall and abd - s/p extensive eval  . Diabetes mellitus with neuropathy ([place])    sees endocrine  . Diverticulosis   . Fatty liver   . GERD (gastroesophageal reflux disease)    takes Nexium bid, hx erosive esophagitis  . Headache(784.0)    occasionally;r/t sinus   . History of colon polyps   . HTN (hypertension)   . Hx of amputation  of lesser toe ([place])    sees podiatrist  . Hyperlipemia   . IBS (irritable bowel syndrome)   . Insomnia    takes Elavil nightly  . Joint pain   . Neuropathy   . Osteomyelitis ([place])   . Pneumonia   . PONV (postoperative nausea and vomiting)   . Seasonal allergies    takes Zyrtec daily  . Sinus tachycardia    Past Surgical History:  Procedure Laterality Date  . AMPUTATION  12/28/2011   Procedure: AMPUTATION DIGIT;  Surgeon: Newt Minion, MD;  Location: [facility];  Service: Orthopedics;  Laterality: Right;  Right Foot 2nd Toe Amputation at MTP (metatarsophalangeal joint)  . AMPUTATION Right 04/25/2012   Procedure: Right Foot 3rd Toe Amputation;  Surgeon: Newt Minion, MD;  Location: [facility];  Service: Orthopedics;  Laterality: Right;  Right Foot Third Toe Amputation   . AMPUTATION Right 07/27/2012   Procedure: Right 4th Toe Amputation at Metatarsophalangeal;  Surgeon: Newt Minion, MD;  Location: [facility];  Service: Orthopedics;  Laterality: Right;  Right 4th Toe Amputation at Metatarsophalangeal  . AMPUTATION Left 07/15/2016   Procedure: Left 2nd Ray Amputation;  Surgeon: Newt Minion, MD;  Location: [facility];  Service: Orthopedics;  Laterality: Left;  . AMPUTATION Left 11/18/2016   Procedure: Left 3rd and 4th Ray Amputation;  Surgeon: Newt Minion, MD;  Location: [facility];  Service: Orthopedics;  Laterality: Left;  . AMPUTATION Right 03/29/2017   Procedure: RIGHT FOOT 3RD AND 4TH RAY AMPUTATION;  Surgeon: Newt Minion, MD;  Location: [facility];  Service: Orthopedics;  Laterality: Right;  . COLONOSCOPY    . LAPAROSCOPIC APPENDECTOMY  01/05/2011   Procedure: APPENDECTOMY LAPAROSCOPIC;  Surgeon: Pedro Earls, MD;  Location: [facility];  Service: General;  Laterality: N/A;  . OOPHORECTOMY  2001  . ROTATOR CUFF REPAIR Right    x 2  . TUBAL LIGATION    . VAGINAL HYSTERECTOMY  2001   Social History   Socioeconomic History  . Marital status: Married    Spouse name: Not on file  . Number of children:  2  . Years of education: Not on file  . Highest education level: Not on file  Occupational History  . Occupation: Retired   Scientific laboratory technician  . Financial resource strain: Not on file  . Food insecurity    Worry: Not on file    Inability: Not on file  . Transportation needs    Medical: Not on file    Non-medical: Not on file  Tobacco Use  . Smoking status: Never Smoker  . Smokeless tobacco: Never Used  Substance and Sexual Activity  . Alcohol use: No  . Drug use: No  . Sexual activity: Yes    Birth control/protection: Surgical  Lifestyle  . Physical activity  Days per week: Not on file    Minutes per session: Not on file  . Stress: Not on file  Relationships  . Social Herbalist on phone: Not on file    Gets together: Not on file    Attends religious service: Not on file    Active member of club or organization: Not on file    Attends meetings of clubs or organizations: Not on file    Relationship status: Not on file  . Intimate partner violence    Fear of current or ex partner: Not on file    Emotionally abused: Not on file    Physically abused: Not on file    Forced sexual activity: Not on file  Other Topics Concern  . Not on file  Social History Narrative   Caffeine daily    [employer], [employer] no diploma   Married '66   1 dtr- '78; 1 son '71; 2 grandchildren   Occupation: retired 04   Dad with alzheimers-had to place in [place] (summer '10)         Current Outpatient Medications on File Prior to Visit  Medication Sig Dispense Refill  . acetaminophen (TYLENOL) 500 MG tablet Take 500 mg by mouth every 8 (eight) hours as needed for mild pain.     Marland Kitchen betamethasone dipropionate (DIPROLENE) 0.05 % cream Apply 1 application topically 2 (two) times daily as needed (IRRITATION). 30 g 0  . Blood Glucose Monitoring Suppl (ONE TOUCH ULTRA 2) w/Device KIT Use to check blood sugar 1 each 0  . cholecalciferol (VITAMIN D) 1000 units tablet Take 1 tablet by mouth daily.     Marland Kitchen  FERROUS SULFATE PO Take 325 mg by mouth daily.     . fexofenadine (HM FEXOFENADINE HCL) 180 MG tablet Take 1 tablet (180 mg total) by mouth daily. 90 tablet 3  . gabapentin (NEURONTIN) 100 MG capsule TAKE 2 CAPSULES BY MOUTH AT BEDTIME 180 capsule 1  . glucose blood test strip Use to check blood sugar three times a day 300 each 2  . Hydrocortisone Butyrate 0.1 % SOLN Place 1-2 drops daily as needed. 60 mL 0  . insulin NPH Human (NOVOLIN N RELION) 100 UNIT/ML injection Inject under skin 25 units in am and 25 units at bedtime 20 mL 3  . Insulin Syringe-Needle U-100 (RELION INSULIN SYRINGE 1ML/31G) 31G X 5/16" 1 ML MISC USE 2 TIMES A DAY 100 each 2  . irbesartan (AVAPRO) 75 MG tablet TAKE 1 TABLET DAILY 90 tablet 1  . metFORMIN (GLUCOPHAGE) 1000 MG tablet TAKE 1 TABLET TWICE A DAY WITH A MEAL 60 tablet 1  . metoprolol succinate (TOPROL-XL) 25 MG 24 hr tablet Take 1 tablet (25 mg total) by mouth daily. 90 tablet 2  . NOVOLIN R RELION 100 UNIT/ML injection INJECT 12 TO 30 UNITS SUBCUTANEOUSLY THREE TIMES DAILY BEFORE MEAL(S) 30 mL 0  . omeprazole (PRILOSEC) 40 MG capsule Take 2 capsules (80 mg total) by mouth 2 (two) times daily. 180 capsule 3  . OVER THE COUNTER MEDICATION Apply 1-3 application topically at bedtime. TOPRICIN FOOT CREAM - NEUROPATHY FOOT CREAM **APPLIES TO BOTH FEET AT BEDTIME**    . Probiotic Product (PROBIOTIC DAILY PO) Take 1 capsule by mouth daily.    . psyllium (METAMUCIL) 58.6 % packet Take 1 packet by mouth daily as needed.     . vitamin B-12 (CYANOCOBALAMIN) 1000 MCG tablet Take 1,000 mcg daily by mouth.    . [DISCONTINUED] EQ  ALLERGY RELIEF, CETIRIZINE, 10 MG tablet Take 1 tablet by mouth once daily 90 tablet 0   No current facility-administered medications on file prior to visit.    Allergies  Allergen Reactions  . Codeine Other (See Comments)    Makes her crazy  . Propoxyphene Hcl Itching    *DARVOCET    Family History  Problem Relation Age of Onset  . Lung cancer  Mother 5       smoked heavily  . Emphysema Mother   . Hypertension Father   . Hyperlipidemia Father   . Diabetes Father   . Coronary artery disease Father   . Dementia Father   . COPD Father        smoked  . Stomach cancer Paternal Aunt   . Brain cancer Paternal Uncle   . Irritable bowel syndrome Other        Several family members on fathers side   . Diabetes Other   . Stomach cancer Maternal Aunt   . Lung cancer Paternal Uncle   . Heart disease Paternal Uncle   . Colon cancer Neg Hx     Objective:   Physical Exam There were no vitals taken for this visit. There is no height or weight on file to calculate BMI. Wt Readings from Last 3 Encounters:  06/21/18 230 lb (104.3 kg)  05/22/18 230 lb (104.3 kg)  05/22/18 238 lb 8 oz (108.2 kg)   Constitutional: overweight, in NAD Eyes: PERRLA, EOMI, no exophthalmos ENT: moist mucous membranes, no thyromegaly, no cervical lymphadenopathy Cardiovascular: RRR, No MRG Respiratory: CTA B Gastrointestinal: abdomen soft, NT, ND, BS+ Musculoskeletal: + Deformities (several amputated toes-see below; Charcot deformity in left foot), strength intact in all 4 Skin: moist, warm, no rashes Neurological: no tremor with outstretched hands, DTR normal in all 4  Assessment:     1. 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 HL  Plan:     1. DM2 - pt with longstanding, uncontrolled, type 2 diabetes, on basal-bolus insulin regimen and metformin.  Her sugars are usually very fluctuating, but that are more consistent with insulin deficiency.  We did not check for this so far since it would not change her management.  She  continues to have occasional low blood sugars, especially in the second half of the day/at night.  At this visit, I advised her to decrease her NPH at bedtime to decrease the chance of dropping her sugars during the night.  However, I am worried that this will raise her sugars in the morning and subsequently later in the day.  We discussed about improving her meals to help with higher blood sugars during the day.  She is eating at the regular scheduled, sometimes 2 times sometimes 3 times a day, very inconsistently. She sometimes prepares her lunch, takes her insulin, and not eat afterwards, and she did drop her sugars in the 60s when this happened in the past per review of her log.  We discussed that if she already took her insulin and she cannot eat, she needs to raise her sugars with glucose tablets.  I advised her to take at least 15 g  of carbs.  Also, I did call in a prescription for glucagon to her pharmacy and I explained how this is used. - I advised her to: Patient Instructions   Please continue: - Metformin 1000 mg 2x a day  Insulin Before breakfast Before lunch Before dinner At bedtime  Regular 20 to 25 units 5 to 10 units 10 to 15 units   NPH 20 x x 15 >> 10   Try to have more structured meals - spaced by 5 hours.  Obtain the glucagon kit from the pharmacy.  Please return in 4 months with your sugar log  - today, HbA1c is 7.6% (higher) - continue checking sugars at different times of the day - check 3x a day, rotating checks - advised for yearly eye exams >> she is UTD - Return to clinic in 4 mo with sugar log   2. PN -Related to diabetes -Has tingling and pain at night -She had to stop amitriptyline due to preeclampsia, but continues on Neurontin  3. HL - Reviewed latest lipid panel from 07/2017: LDL above target, triglycerides at goal Lab Results  Component Value Date   CHOL 184 11/30/2017   HDL 48.40 11/30/2017   LDLCALC 111 (H) 11/30/2017   TRIG 122.0 11/30/2017    CHOLHDL 4 11/30/2017  - Continues Mevacor without side effects.  Philemon Kingdom, MD PhD [facility] Endocrinology

## 2018-07-16 DIAGNOSIS — H1131 Conjunctival hemorrhage, right eye: Secondary | ICD-10-CM | POA: Diagnosis not present

## 2018-07-25 ENCOUNTER — Other Ambulatory Visit: Payer: Self-pay

## 2018-07-25 MED ORDER — METFORMIN HCL 1000 MG PO TABS: ORAL_TABLET | ORAL | 1 refills | Status: DC

## 2018-07-30 DIAGNOSIS — R311 Benign essential microscopic hematuria: Secondary | ICD-10-CM | POA: Diagnosis not present

## 2018-08-10 ENCOUNTER — Other Ambulatory Visit: Payer: Self-pay | Admitting: Internal Medicine

## 2018-08-10 ENCOUNTER — Other Ambulatory Visit: Payer: Self-pay

## 2018-08-10 DIAGNOSIS — E1151 Type 2 diabetes mellitus with diabetic peripheral angiopathy without gangrene: Secondary | ICD-10-CM

## 2018-08-10 DIAGNOSIS — E1165 Type 2 diabetes mellitus with hyperglycemia: Secondary | ICD-10-CM

## 2018-08-10 DIAGNOSIS — IMO0002 Reserved for concepts with insufficient information to code with codable children: Secondary | ICD-10-CM

## 2018-08-10 MED ORDER — GLUCOSE BLOOD VI STRP: ORAL_STRIP | 2 refills | Status: DC

## 2018-08-13 ENCOUNTER — Telehealth: Payer: Self-pay

## 2018-08-13 NOTE — Telephone Encounter (Signed)
SECOND ATTEMPT  LVM again requesting returned call

## 2018-08-13 NOTE — Telephone Encounter (Signed)
Pt called after hours answering service to report having elevated CBG of 287, chest tightness and blurry vision. Triage nurse Kandis Nab, RN documented 08/12/18 @ 6:37pm, blurry vision improved, chest tightness eased and no other concerns having been voiced by pt. It appears from documentation, pt was given care advice per "Guideline". Called pt to follow up and inquire further. LVM requesting returned call.

## 2018-08-14 NOTE — Telephone Encounter (Signed)
FINAL ATTEMPT  Once again, LVM requesting returned call. In light of pt concerns, will route message to office manager for further advice.

## 2018-08-20 ENCOUNTER — Encounter: Payer: Self-pay | Admitting: Internal Medicine

## 2018-08-21 ENCOUNTER — Telehealth: Payer: Self-pay | Admitting: Gastroenterology

## 2018-08-21 NOTE — Telephone Encounter (Signed)
Patient reports that she has been having worsening abdominal pain.  Sge started po iron in March and has noticed since then stomach upset and now pain.  She will try and hold the po iron for a while and see if the pain improves.  She will call with an update.

## 2018-08-22 ENCOUNTER — Other Ambulatory Visit: Payer: Self-pay

## 2018-08-22 DIAGNOSIS — E1151 Type 2 diabetes mellitus with diabetic peripheral angiopathy without gangrene: Secondary | ICD-10-CM

## 2018-08-22 DIAGNOSIS — IMO0002 Reserved for concepts with insufficient information to code with codable children: Secondary | ICD-10-CM

## 2018-08-22 MED ORDER — METFORMIN HCL 1000 MG PO TABS: ORAL_TABLET | ORAL | 0 refills | Status: DC

## 2018-08-23 ENCOUNTER — Other Ambulatory Visit: Payer: Self-pay

## 2018-08-27 ENCOUNTER — Encounter: Payer: Self-pay | Admitting: Internal Medicine

## 2018-08-27 ENCOUNTER — Ambulatory Visit (INDEPENDENT_AMBULATORY_CARE_PROVIDER_SITE_OTHER): Payer: Medicare Other | Admitting: Internal Medicine

## 2018-08-27 ENCOUNTER — Other Ambulatory Visit: Payer: Self-pay

## 2018-08-27 VITALS — BP 130/70 | HR 74 | Ht 65.0 in | Wt 225.0 lb

## 2018-08-27 DIAGNOSIS — E785 Hyperlipidemia, unspecified: Secondary | ICD-10-CM

## 2018-08-27 DIAGNOSIS — G63 Polyneuropathy in diseases classified elsewhere: Secondary | ICD-10-CM | POA: Diagnosis not present

## 2018-08-27 DIAGNOSIS — E1151 Type 2 diabetes mellitus with diabetic peripheral angiopathy without gangrene: Secondary | ICD-10-CM

## 2018-08-27 DIAGNOSIS — IMO0002 Reserved for concepts with insufficient information to code with codable children: Secondary | ICD-10-CM

## 2018-08-27 DIAGNOSIS — E1169 Type 2 diabetes mellitus with other specified complication: Secondary | ICD-10-CM | POA: Diagnosis not present

## 2018-08-27 DIAGNOSIS — E1165 Type 2 diabetes mellitus with hyperglycemia: Secondary | ICD-10-CM | POA: Diagnosis not present

## 2018-08-27 MED ORDER — GABAPENTIN 100 MG PO CAPS: ORAL_CAPSULE | ORAL | 3 refills | Status: DC

## 2018-08-27 MED ORDER — METFORMIN HCL 1000 MG PO TABS: ORAL_TABLET | ORAL | 3 refills | Status: DC

## 2018-08-27 MED ORDER — INSULIN NPH (HUMAN) (ISOPHANE) 100 UNIT/ML ~~LOC~~ SUSP: SUBCUTANEOUS | 3 refills | Status: DC

## 2018-08-27 NOTE — Progress Notes (Signed)
Subjective:     Patient ID: Beth Holden, female   DOB: 05/12/1945, 73 y.o.   MRN: [id number]  HPI Beth Holden is a pleasant 73 y.o. woman returning for f/u for DM2, dx ~2000, uncontrolled, insulin-dependent, with complications (diabetic peripheral neuropathy, multiple toe amputations). Last visit 2 months ago.  Before last visit she had multiple courses of steroids.  Sugars are very variable as were her meals.  We discussed about more consistent mealtimes.  Her brother recently committed suicide at the end of last month and she is still shaken by this.   She switched to a low carb diet recently.  She feels that her sugars are lower after she started this diet.  Last HbA1C was: Lab Results  Component Value Date   HGBA1C 7.4 (A) 07/10/2018   HGBA1C 6.6 (A) 11/28/2017   HGBA1C 7.0 (A) 07/28/2017   She is now on (in bold, the doses that she actually uses as opposed to the ones suggested): - Metformin 1000 mg 2x a day Insulin Before breakfast Before lunch Before dinner At bedtime  Regular 20-25 >> 15-20 units 5-10 units 10-15  >> 15- 20 units   NPH 20 >> 10 x x 15 >> 10   She also tried: Levemir 47 units in am and 37 units in pm >> $800 for 3 mo supply Januvia 100 mg daily - $450 for 3 mo Invokana 100 mg - $455 for 3 mo Took Byetta before We discussed about starting her on a VGo mechanical pump in the past. She had an appointment with diabetes education but decided not to pursue it.He also tried a sliding scale of regular insulin but she was not using it. We tried Trulicity but she could not tolerate it due to nausea, diarrhea, constipation, weakness.  She checks her sugars at least 4 times a day: - am:  89-184, 203, 215 >> 137-159, 200, 203 and last 2 days - 2h after b'fast :  132-242 >> 105-208 >> 155-255 - prelunch:  70-196 >> 164, 236 >> 78, 188-274 - 2-3h after lunch: 110-234, 255 >> 61-351 >> 66, 101-231 - before dinner:  61-177 >> 108-170, 277 >> 88-151, 276 - after dinner:  1148-249 >> 177, 186 >> 82-176, 290 - bedtime: 100-171, 215 >> 113-248 >> 134-224, 340 >> 78-178, 290 - nighttime: 47-153, 193, 303 >> 70-111, 220 >> 48, 71-172, 273 Lowest: 47 >> 48 >> 48.  Has hypoglycemia awareness in the 70s. Highest: 405 (Prednisone) >> 340 >> 342 in 05/2018.  Meals: - Breakfast: egg, bacon, toast; grits; biscuit; fruit; sometimes skips - Lunch: 1/2 PB sandwich or soup and yoghurt, sometimes crackers - Dinner: meat + vegetables + some starch - Snacks: fruit   -No CKD: BUN  Date Value Ref Range Status  05/19/2017 14 7 - 26 mg/dL Final   Creatinine  Date Value Ref Range Status  05/19/2017 0.88 0.60 - 1.10 mg/dL Final  On enalapril.  -+ HL; latest lipid panel: Lab Results  Component Value Date   CHOL 184 11/30/2017   HDL 48.40 11/30/2017   LDLCALC 111 (H) 11/30/2017   TRIG 122.0 11/30/2017   CHOLHDL 4 11/30/2017  On rosuvastatin 20. - last eye exam in 09/2017: + Mild TR, no glaucoma (Dr. Zadie Holden). -+ Numbness and tingling in feet-off amitriptyline, continues on Neurontin  Review of Systems  Constitutional: no weight gain/no weight loss, no fatigue, no subjective hyperthermia, no subjective hypothermia, + nocturia Eyes: no blurry vision, no xerophthalmia ENT: no sore throat, no  nodules palpated in neck, no dysphagia, no odynophagia, no hoarseness Cardiovascular: no CP/no SOB/no palpitations/no leg swelling Respiratory: no cough/no SOB/no wheezing Gastrointestinal: no N/no V/no D/no C/no acid reflux Musculoskeletal: no muscle aches/no joint aches Skin: no rashes, no hair loss Neurological: no tremors/+ numbness/+ tingling/no dizziness  I reviewed pt's medications, allergies, PMH, social hx, family hx, and changes were documented in the history of present illness. Otherwise, unchanged from my initial visit note.  Past Medical History:  Diagnosis Date  . Alopecia   . Anemia, mild   . Arthritis   . Chronic cough    sees pulmonologist  . Chronic pain     chest wall and abd - s/p extensive eval  . Diabetes mellitus with neuropathy ([place])    sees endocrine  . Diverticulosis   . Fatty liver   . GERD (gastroesophageal reflux disease)    takes Nexium bid, hx erosive esophagitis  . Headache(784.0)    occasionally;r/t sinus   . History of colon polyps   . HTN (hypertension)   . Hx of amputation of lesser toe ([place])    sees podiatrist  . Hyperlipemia   . IBS (irritable bowel syndrome)   . Insomnia    takes Elavil nightly  . Joint pain   . Neuropathy   . Osteomyelitis ([place])   . Pneumonia   . PONV (postoperative nausea and vomiting)   . Seasonal allergies    takes Zyrtec daily  . Sinus tachycardia    Past Surgical History:  Procedure Laterality Date  . AMPUTATION  12/28/2011   Procedure: AMPUTATION DIGIT;  Surgeon: Beth Minion, MD;  Location: [facility];  Service: Orthopedics;  Laterality: Right;  Right Foot 2nd Toe Amputation at MTP (metatarsophalangeal joint)  . AMPUTATION Right 04/25/2012   Procedure: Right Foot 3rd Toe Amputation;  Surgeon: Beth Minion, MD;  Location: [facility];  Service: Orthopedics;  Laterality: Right;  Right Foot Third Toe Amputation   . AMPUTATION Right 07/27/2012   Procedure: Right 4th Toe Amputation at Metatarsophalangeal;  Surgeon: Beth Minion, MD;  Location: [facility];  Service: Orthopedics;  Laterality: Right;  Right 4th Toe Amputation at Metatarsophalangeal  . AMPUTATION Left 07/15/2016   Procedure: Left 2nd Ray Amputation;  Surgeon: Beth Minion, MD;  Location: [facility];  Service: Orthopedics;  Laterality: Left;  . AMPUTATION Left 11/18/2016   Procedure: Left 3rd and 4th Ray Amputation;  Surgeon: Beth Minion, MD;  Location: [facility];  Service: Orthopedics;  Laterality: Left;  . AMPUTATION Right 03/29/2017   Procedure: RIGHT FOOT 3RD AND 4TH RAY AMPUTATION;  Surgeon: Beth Minion, MD;  Location: [facility];  Service: Orthopedics;  Laterality: Right;  . COLONOSCOPY    . LAPAROSCOPIC APPENDECTOMY  01/05/2011   Procedure:  APPENDECTOMY LAPAROSCOPIC;  Surgeon: Beth Earls, MD;  [facility];  Service: General;  Laterality: N/A;  . OOPHORECTOMY  2001  . ROTATOR CUFF REPAIR Right    x 2  . TUBAL LIGATION    . VAGINAL HYSTERECTOMY  2001   Social History   Socioeconomic History  . Marital status: Married    Spouse name: Not on file  . Number of children: 2  . Years of education: Not on file  . Highest education level: Not on file  Occupational History  . Occupation: Retired   Scientific laboratory technician  . Financial resource strain: Not on file  . Food insecurity    Worry: Not on file    Inability: Not on  file  . Transportation needs    Medical: Not on file    Non-medical: Not on file  Tobacco Use  . Smoking status: Never Smoker  . Smokeless tobacco: Never Used  Substance and Sexual Activity  . Alcohol use: No  . Drug use: No  . Sexual activity: Yes    Birth control/protection: Surgical  Lifestyle  . Physical activity    Days per week: Not on file    Minutes per session: Not on file  . Stress: Not on file  Relationships  . Social Herbalist on phone: Not on file    Gets together: Not on file    Attends religious service: Not on file    Active member of club or organization: Not on file    Attends meetings of clubs or organizations: Not on file    Relationship status: Not on file  . Intimate partner violence    Fear of current or ex partner: Not on file    Emotionally abused: Not on file    Physically abused: Not on file    Forced sexual activity: Not on file  Other Topics Concern  . Not on file  Social History Narrative   Caffeine daily    [employer], [employer] no diploma   Married '66   1 dtr- '78; 1 son '71; 2 grandchildren   Occupation: retired 04   Dad with alzheimers-had to place in [place] (summer '10)         Current Outpatient Medications on File Prior to Visit  Medication Sig Dispense Refill  . acetaminophen (TYLENOL) 500 MG tablet Take 500 mg by mouth every 8 (eight) hours as  needed for mild pain.     Marland Kitchen betamethasone dipropionate (DIPROLENE) 0.05 % cream Apply 1 application topically 2 (two) times daily as needed (IRRITATION). 30 g 0  . Blood Glucose Monitoring Suppl (ONE TOUCH ULTRA 2) w/Device KIT Use to check blood sugar 1 each 0  . cholecalciferol (VITAMIN D) 1000 units tablet Take 1 tablet by mouth daily.     Marland Kitchen FERROUS SULFATE PO Take 325 mg by mouth daily.     . fexofenadine (HM FEXOFENADINE HCL) 180 MG tablet Take 1 tablet (180 mg total) by mouth daily. 90 tablet 3  . gabapentin (NEURONTIN) 100 MG capsule TAKE 2 CAPSULES BY MOUTH AT BEDTIME 180 capsule 1  . glucagon (GLUCAGEN) 1 MG SOLR injection Inject 1 mg into the muscle once as needed for up to 1 dose for low blood sugar. 1 each 11  . glucose blood test strip Use to check blood sugar three times a day; E11.51 300 each 2  . Hydrocortisone Butyrate 0.1 % SOLN Place 1-2 drops daily as needed. 60 mL 0  . insulin NPH Human (NOVOLIN N RELION) 100 UNIT/ML injection Inject under skin 25 units in am and 25 units at bedtime 20 mL 3  . Insulin Syringe-Needle U-100 (RELION INSULIN SYRINGE 1ML/31G) 31G X 5/16" 1 ML MISC USE 2 TIMES A DAY 100 each 2  . irbesartan (AVAPRO) 75 MG tablet TAKE 1 TABLET DAILY 90 tablet 1  . metFORMIN (GLUCOPHAGE) 1000 MG tablet TAKE 1 TABLET TWICE A DAY WITH A MEAL 180 tablet 0  . metoprolol succinate (TOPROL-XL) 25 MG 24 hr tablet Take 1 tablet (25 mg total) by mouth daily. 90 tablet 2  . NOVOLIN R RELION 100 UNIT/ML injection INJECT 12 TO 30 UNITS SUBCUTANEOUSLY THREE TIMES DAILY BEFORE MEAL(S) 30 mL 0  . omeprazole (  PRILOSEC) 40 MG capsule Take 2 capsules (80 mg total) by mouth 2 (two) times daily. 180 capsule 3  . OVER THE COUNTER MEDICATION Apply 1-3 application topically at bedtime. TOPRICIN FOOT CREAM - NEUROPATHY FOOT CREAM **APPLIES TO BOTH FEET AT BEDTIME**    . Probiotic Product (PROBIOTIC DAILY PO) Take 1 capsule by mouth daily.    . psyllium (METAMUCIL) 58.6 % packet Take 1 packet  by mouth daily as needed.     . vitamin B-12 (CYANOCOBALAMIN) 1000 MCG tablet Take 1,000 mcg daily by mouth.    . [DISCONTINUED] EQ ALLERGY RELIEF, CETIRIZINE, 10 MG tablet Take 1 tablet by mouth once daily 90 tablet 0   No current facility-administered medications on file prior to visit.    Allergies  Allergen Reactions  . Codeine Other (See Comments)    Makes her crazy  . Propoxyphene Hcl Itching    *DARVOCET    Family History  Problem Relation Age of Onset  . Lung cancer Mother 45       smoked heavily  . Emphysema Mother   . Hypertension Father   . Hyperlipidemia Father   . Diabetes Father   . Coronary artery disease Father   . Dementia Father   . COPD Father        smoked  . Stomach cancer Paternal Aunt   . Brain cancer Paternal Uncle   . Irritable bowel syndrome Other        Several family members on fathers side   . Diabetes Other   . Stomach cancer Maternal Aunt   . Lung cancer Paternal Uncle   . Heart disease Paternal Uncle   . Colon cancer Neg Hx     Objective:   Physical Exam There were no vitals taken for this visit. There is no height or weight on file to calculate BMI. Wt Readings from Last 3 Encounters:  07/10/18 232 lb (105.2 kg)  06/21/18 230 lb (104.3 kg)  05/22/18 230 lb (104.3 kg)   Constitutional: overweight, in NAD Eyes: PERRLA, EOMI, no exophthalmos ENT: moist mucous membranes, no thyromegaly, no cervical lymphadenopathy Cardiovascular: RRR, No MRG Respiratory: CTA B Gastrointestinal: abdomen soft, NT, ND, BS+ Musculoskeletal: + deformities (several amputated toes-see below; Charcot deformity in left foot), strength intact in all 4 Skin: moist, warm, no rashes Neurological: no tremor with outstretched hands, DTR normal in all 4  Assessment:     1. DM2, uncontrolled, insulin-dependent, with complications - diabetic peripheral neuropathy - 3 toe amputations - after infected diabetic toe ulcers >> OM:  R 2nd toe amputated on  12/28/2011  R 3rd toe amputated on 04/25/2012  R 4th toe amputated on 07/27/2012  L 2nd toe amputated on 07/15/2016  L1st and 5th toes amputated 11/18/2016    2. 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 HL  Plan:     1. DM2 - pt with longstanding, uncontrolled, type 2 diabetes, on basal-bolus insulin regimen (NPH and regular insulin) and metformin.  Sugars are usually very fluctuating, consistent with insulin deficiency.  We did not check her for type 1 diabetes since this would not change her management.  In the past, she had lower blood sugars especially in the second half of the day and also at night.  At last visit we decreased her NPH insulin at night to lower the risk above nocturnal hypoglycemia.  We also discussed about improving her meals by spacing at least 5 hours apart.  At that time she was also skipping meals after  taking her insulin and we discussed that this is very dangerous and if she cannot eat to at least take 15 g of carbs.  I did call in a prescription for glucagon to her pharmacy and I explained when this is used.  She obtained this from the pharmacy but does not know how to use it.  I advised her to schedule a nurse visit as she also wants her husband to accompany her to learn how to inject it. -At this visit, reviewing her insulin doses at home, she is not actually taking the 1 suggested at last visit.  She takes a higher dose of insulin with dinner so she continues to have low blood sugars at night.  We will back off these doses today and since her sugars are higher after breakfast and before lunch, I will increase her regular and NPH doses in the morning -She is on a low-carb diet and we discussed that she may need to back off on her insulin doses if she continues to have low blood sugars.  She needs to let me know in that case. -She is interested in a CGM and we discussed about the freestyle libre 2 that is coming out soon and which will have alarms and also about the Dexcom G6 CGM.  I  advised her to check with her insurance to see which ones are covered and I also gave her a list of suppliers. - I advised her to: Patient Instructions   Please continue: - Metformin 1000 mg 2x a day  Please change: Insulin Before breakfast Before lunch Before dinner At bedtime  Regular 15-20 >> 20-25 5-10 units 15-20 >> 10-15   NPH 10 >> 15 x x 10   Please return in 3-4 months with your sugar log  - her most recent HbA1c was in 06/2018 (7.4%, higher) so we did not recheck it today - advised to check sugars at different times of the day - 3x a day, rotating check times - advised for yearly eye exams >> she is not UTD - She is due for annual labs but thinks that her urologist checked her kidney function.  She will look at home and let me know as I do not have these records.  If he did not, I will recheck these and her lipids at next visit. - return to clinic in 3-4 months   2. PN -Related to her diabetes -She has tingling and pain at night but mostly in her left big toe -Off amitriptyline but continues Neurontin -refilled today (she takes 2 to 3 capsules at night)  3. HL - Reviewed latest lipid panel from 11/2017: LDL above target, triglycerides at goal Lab Results  Component Value Date   CHOL 184 11/30/2017   HDL 48.40 11/30/2017   LDLCALC 111 (H) 11/30/2017   TRIG 122.0 11/30/2017   CHOLHDL 4 11/30/2017  - Continues rosuvastatin 20 without side effects.  Philemon Kingdom, MD PhD [facility] Endocrinology

## 2018-08-27 NOTE — Patient Instructions (Addendum)
Please continue: - Metformin 1000 mg 2x a day  Please change: Insulin Before breakfast Before lunch Before dinner At bedtime  Regular 15-20 >> 20-25 5-10 units 15-20 >> 10-15   NPH 10 >> 15 x x 10   Please return in 3-4 months with your sugar log

## 2018-09-20 ENCOUNTER — Ambulatory Visit: Payer: Medicare Other | Admitting: Orthopedic Surgery

## 2018-09-20 ENCOUNTER — Ambulatory Visit: Payer: Medicare Other | Admitting: Physician Assistant

## 2018-10-01 ENCOUNTER — Encounter: Payer: Self-pay | Admitting: Orthopedic Surgery

## 2018-10-01 ENCOUNTER — Ambulatory Visit (INDEPENDENT_AMBULATORY_CARE_PROVIDER_SITE_OTHER): Payer: Medicare Other | Admitting: Orthopedic Surgery

## 2018-10-01 VITALS — Ht 65.0 in | Wt 225.0 lb

## 2018-10-01 DIAGNOSIS — E1159 Type 2 diabetes mellitus with other circulatory complications: Secondary | ICD-10-CM

## 2018-10-01 DIAGNOSIS — Z89429 Acquired absence of other toe(s), unspecified side: Secondary | ICD-10-CM | POA: Diagnosis not present

## 2018-10-01 DIAGNOSIS — B351 Tinea unguium: Secondary | ICD-10-CM

## 2018-10-01 DIAGNOSIS — L97521 Non-pressure chronic ulcer of other part of left foot limited to breakdown of skin: Secondary | ICD-10-CM | POA: Diagnosis not present

## 2018-10-01 DIAGNOSIS — M14672 Charcot's joint, left ankle and foot: Secondary | ICD-10-CM

## 2018-10-01 NOTE — Progress Notes (Signed)
Office Visit Note   [name]           Date of Birth: February 20, 1945           MRN: [id number] Visit Date: 10/01/2018              Requested by: Vivi Barrack, MD [address] [place],  [place] [address] PCP: Vivi Barrack, MD  Chief Complaint  Patient presents with  . Left Foot - Follow-up  . Right Foot - Follow-up      HPI: Patient is a 73 year old woman who presents for evaluation of both lower extremities she complains of onychomycotic nails on her feet of the great toe a little toe bilaterally she states she is unable to safely trim the nails on her own due to her diabetic insensate neuropathy.  She states she has had some hip pain which she is questioning whether this is from not exercising enough.  She is currently wearing a double upright brace on the left she is wearing compression socks.  She does have Charcot arthropathy of the left foot with a Wagner grade 1 ulcer.  Assessment & Plan: Visit Diagnoses:  1. Onychomycosis   2. Charcot's joint, left ankle and foot   3. Ulcer of left foot, limited to breakdown of skin ([place])     Plan: Ulcer was debrided of skin and soft tissue recommended using her stationary bike for helping the hip and knee stiffness.  Follow-Up Instructions: Return in about 3 months (around 12/31/2018).   Ortho Exam  Patient is alert, oriented, no adenopathy, well-dressed, normal affect, normal respiratory effort. Examination patient has thickened discolored onychomycotic nails x4 she is unable to safely trim them on her own the nails were trimmed x4 without complication.  She has a Medical illustrator grade 1 ulcer beneath the Charcot rocker-bottom deformity of the left there is no signs or symptoms of active Charcot arthropathy.  After informed consent a 10 blade knife was used to debride the skin and soft tissue back to healthy viable granulation tissue a Band-Aid was applied.  Ulcer is 2 cm in diameter 3 mm deep  Imaging: No results found. No  images are attached to the encounter.  Labs: Lab Results  Component Value Date   HGBA1C 7.4 (A) 07/10/2018   HGBA1C 6.6 (A) 11/28/2017   HGBA1C 7.0 (A) 07/28/2017   ESRSEDRATE 36 (H) 02/24/2017   CRP 1.2 (H) 02/24/2017   LABURIC 5.5 09/01/2015   LABORGA GROUP B STREP (S.AGALACTIAE) ISOLATED 12/26/2014     Lab Results  Component Value Date   ALBUMIN 3.8 05/19/2017   ALBUMIN 3.6 04/21/2017   ALBUMIN 3.6 02/24/2017   PREALBUMIN 19.4 03/05/2012   LABURIC 5.5 09/01/2015    No results found for: MG No results found for: VD25OH  Lab Results  Component Value Date   PREALBUMIN 19.4 03/05/2012   CBC EXTENDED Latest Ref Rng & Units 05/22/2018 01/17/2018 09/22/2017  WBC 4.0 - 10.5 K/uL 11.6(H) 11.7(H) 9.7  RBC 3.87 - 5.11 MIL/uL 4.54 4.63 4.51  HGB 12.0 - 15.0 g/dL 12.4 12.6 12.5  HCT 36.0 - 46.0 % 40.4 40.1 39.1  PLT 150 - 400 K/uL 361 372 351  NEUTROABS 1.7 - 7.7 K/uL 8.4(H) 8.2(H) 6.7(H)  LYMPHSABS 0.7 - 4.0 K/uL 2.3 2.5 2.3     Body mass index is 37.44 kg/m.  Orders:  No orders of the defined types were placed in this encounter.  No orders of the defined types were placed in  this encounter.    Procedures: No procedures performed  Clinical Data: No additional findings.  ROS:  All other systems negative, except as noted in the HPI. Review of Systems  Objective: Vital Signs: Ht 5\' 5"  (1.651 m)   Wt 225 lb (102.1 kg)   BMI 37.44 kg/m   Specialty Comments:  No specialty comments available.  PMFS History: Patient Active Problem List   Diagnosis Date Noted  . Hyperlipidemia associated with type 2 diabetes mellitus ([place]) 11/30/2017  . Impingement syndrome of right shoulder 07/06/2017  . Iron deficiency anemia 05/11/2017  . Morbid obesity ([place]) 04/24/2017  . Anemia 02/24/2017  . Baker's cyst 07/10/2016  . Onychomycosis 12/07/2015  . Upper airway cough syndrome 03/05/2014  . History of amputation of lesser toe of right foot ([place]) 04/15/2013  . Hypertension  associated with diabetes ([place]) 12/05/2006  . Uncontrolled type 2 diabetes mellitus with peripheral circulatory disorder ([place]) 10/12/2006  . Dyslipidemia associated with type 2 diabetes mellitus ([place]) 10/12/2006  . Allergic rhinitis 10/12/2006  . GERD - Followed by Dr. Fuller Plan 10/12/2006  . IRRITABLE BOWEL SYNDROME - followed by Dr. Fuller Plan 10/12/2006  . Peripheral neuropathy 10/11/2006  . ALOPECIA NEC 10/11/2006   Past Medical History:  Diagnosis Date  . Alopecia   . Anemia, mild   . Arthritis   . Chronic cough    sees pulmonologist  . Chronic pain    chest wall and abd - s/p extensive eval  . Diabetes mellitus with neuropathy ([place])    sees endocrine  . Diverticulosis   . Fatty liver   . GERD (gastroesophageal reflux disease)    takes Nexium bid, hx erosive esophagitis  . Headache(784.0)    occasionally;r/t sinus   . History of colon polyps   . HTN (hypertension)   . Hx of amputation of lesser toe ([place])    sees podiatrist  . Hyperlipemia   . IBS (irritable bowel syndrome)   . Insomnia    takes Elavil nightly  . Joint pain   . Neuropathy   . Osteomyelitis ([place])   . Pneumonia   . PONV (postoperative nausea and vomiting)   . Seasonal allergies    takes Zyrtec daily  . Sinus tachycardia     Family History  Problem Relation Age of Onset  . Lung cancer Mother 52       smoked heavily  . Emphysema Mother   . Hypertension Father   . Hyperlipidemia Father   . Diabetes Father   . Coronary artery disease Father   . Dementia Father   . COPD Father        smoked  . Stomach cancer Paternal Aunt   . Brain cancer Paternal Uncle   . Irritable bowel syndrome Other        Several family members on fathers side   . Diabetes Other   . Stomach cancer Maternal Aunt   . Lung cancer Paternal Uncle   . Heart disease Paternal Uncle   . Colon cancer Neg Hx     Past Surgical History:  Procedure Laterality Date  . AMPUTATION  12/28/2011   Procedure: AMPUTATION DIGIT;  Surgeon: Newt Minion, MD;  Location: [facility];  Service: Orthopedics;  Laterality: Right;  Right Foot 2nd Toe Amputation at MTP (metatarsophalangeal joint)  . AMPUTATION Right 04/25/2012   Procedure: Right Foot 3rd Toe Amputation;  Surgeon: Newt Minion, MD;  Location: [facility];  Service: Orthopedics;  Laterality: Right;  Right Foot Third Toe Amputation   .  AMPUTATION Right 07/27/2012   Procedure: Right 4th Toe Amputation at Metatarsophalangeal;  Surgeon: Newt Minion, MD;  Location: [facility];  Service: Orthopedics;  Laterality: Right;  Right 4th Toe Amputation at Metatarsophalangeal  . AMPUTATION Left 07/15/2016   Procedure: Left 2nd Ray Amputation;  Surgeon: Newt Minion, MD;  Location: [facility];  Service: Orthopedics;  Laterality: Left;  . AMPUTATION Left 11/18/2016   Procedure: Left 3rd and 4th Ray Amputation;  Surgeon: Newt Minion, MD;  Location: [facility];  Service: Orthopedics;  Laterality: Left;  . AMPUTATION Right 03/29/2017   Procedure: RIGHT FOOT 3RD AND 4TH RAY AMPUTATION;  Surgeon: Newt Minion, MD;  Location: [facility];  Service: Orthopedics;  Laterality: Right;  . COLONOSCOPY    . LAPAROSCOPIC APPENDECTOMY  01/05/2011   Procedure: APPENDECTOMY LAPAROSCOPIC;  Surgeon: Pedro Earls, MD;  [facility];  Service: General;  Laterality: N/A;  . OOPHORECTOMY  2001  . ROTATOR CUFF REPAIR Right    x 2  . TUBAL LIGATION    . VAGINAL HYSTERECTOMY  2001   Social History   Occupational History  . Occupation: Retired   Tobacco Use  . Smoking status: Never Smoker  . Smokeless tobacco: Never Used  Substance and Sexual Activity  . Alcohol use: No  . Drug use: No  . Sexual activity: Yes    Birth control/protection: Surgical

## 2018-10-02 ENCOUNTER — Other Ambulatory Visit: Payer: Self-pay

## 2018-10-02 ENCOUNTER — Encounter: Payer: Self-pay | Admitting: Family Medicine

## 2018-10-02 MED ORDER — CETIRIZINE HCL 10 MG PO TABS: 10.0000 mg | ORAL_TABLET | Freq: Every day | ORAL | 1 refills | Status: DC

## 2018-10-18 ENCOUNTER — Other Ambulatory Visit: Payer: Self-pay | Admitting: Family Medicine

## 2018-10-19 ENCOUNTER — Ambulatory Visit: Payer: Self-pay | Admitting: *Deleted

## 2018-10-19 ENCOUNTER — Encounter: Payer: Self-pay | Admitting: Family Medicine

## 2018-10-19 ENCOUNTER — Ambulatory Visit (INDEPENDENT_AMBULATORY_CARE_PROVIDER_SITE_OTHER): Payer: Medicare Other | Admitting: Family Medicine

## 2018-10-19 ENCOUNTER — Other Ambulatory Visit: Payer: Self-pay

## 2018-10-19 DIAGNOSIS — R0981 Nasal congestion: Secondary | ICD-10-CM | POA: Diagnosis not present

## 2018-10-19 DIAGNOSIS — Z20822 Contact with and (suspected) exposure to covid-19: Secondary | ICD-10-CM

## 2018-10-19 DIAGNOSIS — R059 Cough, unspecified: Secondary | ICD-10-CM

## 2018-10-19 DIAGNOSIS — R05 Cough: Secondary | ICD-10-CM | POA: Diagnosis not present

## 2018-10-19 DIAGNOSIS — Z20828 Contact with and (suspected) exposure to other viral communicable diseases: Secondary | ICD-10-CM | POA: Diagnosis not present

## 2018-10-19 MED ORDER — AZELASTINE HCL 0.1 % NA SOLN: 2.0000 | Freq: Two times a day (BID) | NASAL | 12 refills | Status: DC

## 2018-10-19 MED ORDER — BENZONATATE 200 MG PO CAPS: 200.0000 mg | ORAL_CAPSULE | Freq: Two times a day (BID) | ORAL | 0 refills | Status: DC | PRN

## 2018-10-19 MED ORDER — AZITHROMYCIN 250 MG PO TABS: ORAL_TABLET | ORAL | 0 refills | Status: DC

## 2018-10-19 NOTE — Telephone Encounter (Signed)
Pt called with complaints of cold symptoms (headache, right ear ache, runny nose) , and "hard to breathe"; the pt says that she has SOB when she has coughing spells; her cough is worse at night; her symptoms started 1 week ago; she has not taken any OTC; pt has a chronic cough, but this is not the same; recommendations made per nurse triage protocol; she verbalized understanding; the pt sees Dr Jerline Pain, [place]; she can be contacted at [phone]; will route to office for notification.  Reason for Disposition . [1] Continuous (nonstop) coughing AND [2] keeps from working or sleeping  Answer Assessment - Initial Assessment Questions 1. RESPIRATORY STATUS: "Describe your breathing?" (e.g., wheezing, shortness of breath, unable to speak, severe coughing)      cough 2. ONSET: "When did this breathing problem begin?"      10/12/2018 3. PATTERN "Does the difficult breathing come and go, or has it been constant since it started?"     Intermittent (happens when having coughing spells) 4. SEVERITY: "How bad is your breathing?" (e.g., mild, moderate, severe)    - MILD: No SOB at rest, mild SOB with walking, speaks normally in sentences, can lay down, no retractions, pulse < 100.    - MODERATE: SOB at rest, SOB with minimal exertion and prefers to sit, cannot lie down flat, speaks in phrases, mild retractions, audible wheezing, pulse 100-120.    - SEVERE: Very SOB at rest, speaks in single words, struggling to breathe, sitting hunched forward, retractions, pulse > 120    Moderate; cannot lay flat 5. RECURRENT SYMPTOM: "Have you had difficulty breathing before?" If so, ask: "When was the last time?" and "What happened that time?"      Yes due to chronic cough 6. CARDIAC HISTORY: "Do you have any history of heart disease?" (e.g., heart attack, angina, bypass surgery, angioplasty)     Increased HR 7. LUNG HISTORY: "Do you have any history of lung disease?"  (e.g., pulmonary embolus, asthma,  emphysema)     Chronic cough 8. CAUSE: "What do you think is causing the breathing problem?"    cold 9. OTHER SYMPTOMS: "Do you have any other symptoms? (e.g., dizziness, runny nose, cough, chest pain, fever)     Headache, earache, congestion 10. PREGNANCY: "Is there any chance you are pregnant?" "When was your last menstrual period?"       no 11. TRAVEL: "Have you traveled out of the country in the last month?" (e.g., travel history, exposures)      Went to [facility] 10/12/2018  Protocols used: BREATHING DIFFICULTY-A-AH

## 2018-10-19 NOTE — Progress Notes (Signed)
   Chief Complaint:  Beth Holden is a 73 y.o. female who presents today for a virtual office visit with a chief complaint of cough.   Assessment/Plan:  Cough No red flags.  Will send for COVID testing.  Will treat symptoms with Astelin nasal spray and Tessalon for cough.  Will send in "pocket prescription" for azithromycin with instruction not started with symptoms worsen or not improve next few days.  Encouraged good oral hydration.  Can continue using over-the-counter analgesics.  Discussed reasons return to care.  Follow-up as needed.    Subjective:  HPI:  Cough Started about a week ago. Associated symptoms include headache, right ear ache, and rhinorrhea.  No sore throat. No fevers. Some chills at night. No body aches. No treatments tried. Symptoms seem to be worse at night and in the morning. Her grandchildren have been sick with similar symptoms. No other obvious alleviating or aggravating factors.   ROS: Per HPI  PMH: She reports that she has never smoked. She has never used smokeless tobacco. She reports that she does not drink alcohol or use drugs.      Objective/Observations  Physical Exam: Gen: NAD, resting comfortably Pulm: Normal work of breathing Neuro: Grossly normal, moves all extremities Psych: Normal affect and thought content  Virtual Visit via Video   I connected with Beth Holden on 10/19/18 at  4:00 PM EDT by a video enabled telemedicine application and verified that I am speaking with the correct person using two identifiers. I discussed the limitations of evaluation and management by telemedicine and the availability of in person appointments. The patient expressed understanding and agreed to proceed.   Patient location: Home Provider location: [place] participating in the virtual visit: Myself and Patient     Algis Greenhouse. Jerline Pain, MD 10/19/2018 8:20 AM

## 2018-10-19 NOTE — Telephone Encounter (Signed)
See note

## 2018-10-19 NOTE — Telephone Encounter (Signed)
Patient scheduled.

## 2018-10-20 LAB — NOVEL CORONAVIRUS, NAA: SARS-CoV-2, NAA: NOT DETECTED

## 2018-10-22 NOTE — Progress Notes (Signed)
Dr Marigene Ehlers interpretation of your lab work:  Good news! Your COVID test is negative. You do not have COVID.    If you have any additional questions, please give [place] a call or send [place] a message through [place].  Take care, Dr Jerline Pain

## 2018-10-25 NOTE — Telephone Encounter (Signed)
Patient canceled her home sleep study on 11/07/17 and then again on 11/16/2017 with no reason documented.

## 2018-10-25 NOTE — Telephone Encounter (Signed)
Patient states she does not want to reschedule her home sleep test at this time.

## 2018-10-26 ENCOUNTER — Other Ambulatory Visit: Payer: Self-pay

## 2018-10-26 ENCOUNTER — Encounter: Payer: Self-pay | Admitting: Family Medicine

## 2018-10-26 MED ORDER — ROSUVASTATIN CALCIUM 20 MG PO TABS: 20.0000 mg | ORAL_TABLET | Freq: Every day | ORAL | 1 refills | Status: DC

## 2018-10-26 MED ORDER — IRBESARTAN 75 MG PO TABS: 75.0000 mg | ORAL_TABLET | Freq: Every day | ORAL | 1 refills | Status: DC

## 2018-10-30 ENCOUNTER — Encounter: Payer: Self-pay | Admitting: Internal Medicine

## 2018-10-30 DIAGNOSIS — E113392 Type 2 diabetes mellitus with moderate nonproliferative diabetic retinopathy without macular edema, left eye: Secondary | ICD-10-CM | POA: Diagnosis not present

## 2018-10-30 DIAGNOSIS — E113291 Type 2 diabetes mellitus with mild nonproliferative diabetic retinopathy without macular edema, right eye: Secondary | ICD-10-CM | POA: Diagnosis not present

## 2018-10-30 DIAGNOSIS — H2513 Age-related nuclear cataract, bilateral: Secondary | ICD-10-CM | POA: Diagnosis not present

## 2018-10-30 DIAGNOSIS — H43811 Vitreous degeneration, right eye: Secondary | ICD-10-CM | POA: Diagnosis not present

## 2018-10-30 DIAGNOSIS — H3562 Retinal hemorrhage, left eye: Secondary | ICD-10-CM | POA: Diagnosis not present

## 2018-10-30 LAB — HM DIABETES EYE EXAM

## 2018-10-31 ENCOUNTER — Other Ambulatory Visit: Payer: Self-pay | Admitting: Internal Medicine

## 2018-10-31 MED ORDER — INSULIN NPH (HUMAN) (ISOPHANE) 100 UNIT/ML ~~LOC~~ SUSP: SUBCUTANEOUS | 3 refills | Status: DC

## 2018-11-03 ENCOUNTER — Encounter: Payer: Self-pay | Admitting: Internal Medicine

## 2018-11-03 ENCOUNTER — Other Ambulatory Visit: Payer: Self-pay | Admitting: Internal Medicine

## 2018-11-13 ENCOUNTER — Ambulatory Visit: Payer: Medicare Other | Admitting: Internal Medicine

## 2018-11-13 ENCOUNTER — Other Ambulatory Visit: Payer: Self-pay

## 2018-11-13 ENCOUNTER — Ambulatory Visit (INDEPENDENT_AMBULATORY_CARE_PROVIDER_SITE_OTHER): Payer: Medicare Other

## 2018-11-13 DIAGNOSIS — Z0000 Encounter for general adult medical examination without abnormal findings: Secondary | ICD-10-CM | POA: Diagnosis not present

## 2018-11-13 DIAGNOSIS — Z1231 Encounter for screening mammogram for malignant neoplasm of breast: Secondary | ICD-10-CM

## 2018-11-13 NOTE — Progress Notes (Signed)
Patient stated she is feeling ok at this time,she will call to schedule appt if symptoms worsen.

## 2018-11-13 NOTE — Progress Notes (Signed)
I connected with Beth Holden on 11/13/18 at 40 by phone and verified that I am speaking with the correct person using two identifiers. Location patient: Home Location provider: [facility], Office Persons participating in the virtual visit: Denman George LPN and Dr. Dimas Chyle    I discussed the limitations of evaluation and management by telemedicine and the availability of in person appointments. The patient expressed understanding and agreed to proceed.  Subjective:   Beth Holden is a 73 y.o. female who presents for Medicare Annual (Subsequent) preventive examination.  Review of Systems:  Cardiac Risk Factors include: advanced age (>10mn, >>87women);diabetes mellitus;dyslipidemia;hypertension    Objective:     Vitals: There were no vitals taken for this visit.  There is no height or weight on file to calculate BMI.  Advanced Directives 11/13/2018 03/29/2017 03/13/2017 02/24/2017 02/15/2017 12/23/2016 11/18/2016  Does Patient Have a Medical Advance Directive? No No No No No No No  Would patient like information on creating a medical advance directive? No - Patient declined No - Patient declined No - Patient declined No - Patient declined - - -  Pre-existing out of facility DNR order (yellow form or pink MOST form) - - - - - - -    Tobacco Social History   Tobacco Use  Smoking Status Never Smoker  Smokeless Tobacco Never Used     Counseling given: Not Answered   Clinical Intake:  Pre-visit preparation completed: Yes  Pain : No/denies pain  Diabetes: Yes(followed by Endocrinology- Dr. GCruzita Lederer CBG done?: No Did pt. bring in CBG monitor from home?: No  How often do you need to have someone help you when you read instructions, pamphlets, or other written materials from your doctor or pharmacy?: 1 - Never  Interpreter Needed?: No  Information entered by :: CDenman GeorgeLPN  Past Medical History:  Diagnosis Date  . Alopecia   . Anemia, mild   . Arthritis    . Chronic cough    sees pulmonologist  . Chronic pain    chest wall and abd - s/p extensive eval  . Diabetes mellitus with neuropathy [place]    sees endocrine  . Diverticulosis   . Fatty liver   . GERD (gastroesophageal reflux disease)    takes Nexium bid, hx erosive esophagitis  . Headache(784.0)    occasionally;r/t sinus   . History of colon polyps   . HTN (hypertension)   . H[place] of [place] of [place]    sees podiatrist  . Hyperlipemia   . IBS (irritable bowel syndrome)   . Insomnia    takes Elavil nightly  . Joint pain   . Neuropathy   . Osteomyelitis ([facility]   . Pneumonia   . PONV (postoperative nausea and vomiting)   . Seasonal allergies    takes Zyrtec daily  . Sinus tachycardia    Past Surgical History:  Procedure Laterality Date  . AMPUTATION  12/28/2011   Procedure: AMPUTATION DIGIT;  Surgeon: MNewt Minion MD;  [facility]  Service: Orthopedics;  Laterality: Right;  Right Foot 2nd Toe Amputation at MTP (metatarsophalangeal joint)  . AMPUTATION Right 04/25/2012   Procedure: Right Foot 3rd Toe Amputation;  Surgeon: MNewt Minion MD;  [facility]  Service: Orthopedics;  Laterality: Right;  Right Foot Third Toe Amputation   . AMPUTATION Right 07/27/2012   Procedure: Right 4th Toe Amputation at Metatarsophalangeal;  Surgeon: MNewt Minion MD;  [facility] [facility]  Service: Orthopedics;  Laterality:  Right;  Right 4th Toe Amputation at Metatarsophalangeal  . AMPUTATION Left 07/15/2016   Procedure: Left 2nd Ray Amputation;  Surgeon: Newt Minion, MD;  Location: [facility];  Service: Orthopedics;  Laterality: Left;  . AMPUTATION Left 11/18/2016   Procedure: Left 3rd and 4th Ray Amputation;  Surgeon: Newt Minion, MD;  Location: [facility];  Service: Orthopedics;  Laterality: Left;  . AMPUTATION Right 03/29/2017   Procedure: RIGHT FOOT 3RD AND 4TH RAY AMPUTATION;  Surgeon: Newt Minion, MD;  Location: [facility];  Service: Orthopedics;  Laterality: Right;  . COLONOSCOPY     . LAPAROSCOPIC APPENDECTOMY  01/05/2011   Procedure: APPENDECTOMY LAPAROSCOPIC;  Surgeon: Pedro Earls, MD;  Location: [facility];  Service: General;  Laterality: N/A;  . OOPHORECTOMY  2001  . ROTATOR CUFF REPAIR Right    x 2  . TUBAL LIGATION    . VAGINAL HYSTERECTOMY  2001   Family History  Problem Relation Age of Onset  . Lung cancer Mother 37       smoked heavily  . Emphysema Mother   . Hypertension Father   . Hyperlipidemia Father   . Diabetes Father   . Coronary artery disease Father   . Dementia Father   . COPD Father        smoked  . Stomach cancer Paternal Aunt   . Brain cancer Paternal Uncle   . Irritable bowel syndrome Other        Several family members on fathers side   . Diabetes Other   . Stomach cancer Maternal Aunt   . Lung cancer Paternal Uncle   . Heart disease Paternal Uncle   . Colon cancer Neg Hx    Social History   Socioeconomic History  . Marital status: Married    Spouse name: Not on file  . Number of children: 2  . Years of education: Not on file  . Highest education level: Not on file  Occupational History  . Occupation: Retired   Scientific laboratory technician  . Financial resource strain: Not on file  . Food insecurity    Worry: Not on file    Inability: Not on file  . Transportation needs    Medical: Not on file    Non-medical: Not on file  Tobacco Use  . Smoking status: Never Smoker  . Smokeless tobacco: Never Used  Substance and Sexual Activity  . Alcohol use: No  . Drug use: No  . Sexual activity: Yes    Birth control/protection: Surgical  Lifestyle  . Physical activity    Days per week: Not on file    Minutes per session: Not on file  . Stress: Not on file  Relationships  . Social Herbalist on phone: Not on file    Gets together: Not on file    Attends religious service: Not on file    Active member of club or organization: Not on file    Attends meetings of clubs or organizations: Not on file    Relationship status:  Not on file  Other Topics Concern  . Not on file  Social History Narrative   Caffeine daily    [employer], [employer] no diploma   Married '66   1 dtr- '78; 1 son '71; 2 grandchildren   Occupation: retired 04   Dad with alzheimers-had to place in [place] (summer '10)          Outpatient Encounter Medications as of 11/13/2018  Medication Sig  .  acetaminophen (TYLENOL) 500 MG tablet Take 500 mg by mouth every 8 (eight) hours as needed for mild pain.   Marland Kitchen azelastine (ASTELIN) 0.1 % nasal spray Place 2 sprays into both nostrils 2 (two) times daily.  . betamethasone dipropionate (DIPROLENE) 0.05 % cream Apply 1 application topically 2 (two) times daily as needed (IRRITATION).  Marland Kitchen Blood Glucose Monitoring Suppl (ONE TOUCH ULTRA 2) w/Device KIT Use to check blood sugar  . cetirizine (EQ ALLERGY RELIEF, CETIRIZINE,) 10 MG tablet Take 1 tablet (10 mg total) by mouth daily.  . cholecalciferol (VITAMIN D) 1000 units tablet Take 1 tablet by mouth daily.   Marland Kitchen gabapentin (NEURONTIN) 100 MG capsule TAKE 2-3 CAPSULES BY MOUTH AT BEDTIME  . glucagon (GLUCAGEN) 1 MG SOLR injection Inject 1 mg into the muscle once as needed for up to 1 dose for low blood sugar.  Marland Kitchen glucose blood test strip Use to check blood sugar three times a day; E11.51  . Hydrocortisone Butyrate 0.1 % SOLN Place 1-2 drops daily as needed.  . insulin NPH Human (NOVOLIN N RELION) 100 UNIT/ML injection Inject under skin 15 units in am and 10 units at bedtime  . Insulin Syringe-Needle U-100 (RELION INSULIN SYRINGE 1ML/31G) 31G X 5/16" 1 ML MISC USE 2 TIMES A DAY  . irbesartan (AVAPRO) 75 MG tablet Take 1 tablet (75 mg total) by mouth daily.  . metFORMIN (GLUCOPHAGE) 1000 MG tablet TAKE 1 TABLET TWICE A DAY WITH A MEAL  . metoprolol succinate (TOPROL-XL) 25 MG 24 hr tablet Take 1 tablet (25 mg total) by mouth daily.  Marland Kitchen NOVOLIN R RELION 100 UNIT/ML injection INJECT 12 TO 30 UNITS SUBCUTANEOUSLY THREE TIMES DAILY BEFORE MEAL(S)  . omeprazole (PRILOSEC) 40 MG  capsule Take 2 capsules (80 mg total) by mouth 2 (two) times daily.  Marland Kitchen OVER THE COUNTER MEDICATION Apply 1-3 application topically at bedtime. TOPRICIN FOOT CREAM - NEUROPATHY FOOT CREAM **APPLIES TO BOTH FEET AT BEDTIME**  . Probiotic Product (PROBIOTIC DAILY PO) Take 1 capsule by mouth daily.  . psyllium (METAMUCIL) 58.6 % packet Take 1 packet by mouth daily as needed.   . rosuvastatin (CRESTOR) 20 MG tablet Take 1 tablet (20 mg total) by mouth daily.  . vitamin B-12 (CYANOCOBALAMIN) 1000 MCG tablet Take 1,000 mcg daily by mouth.  . [DISCONTINUED] azithromycin (ZITHROMAX) 250 MG tablet Take 2 tabs day 1, then 1 tab daily  . [DISCONTINUED] benzonatate (TESSALON) 200 MG capsule Take 1 capsule (200 mg total) by mouth 2 (two) times daily as needed for cough.  . [DISCONTINUED] FERROUS SULFATE PO Take 325 mg by mouth daily.   . [DISCONTINUED] fexofenadine (HM FEXOFENADINE HCL) 180 MG tablet Take 1 tablet (180 mg total) by mouth daily.   No facility-administered encounter medications on file as of 11/13/2018.     Activities of Daily Living In your present state of health, do you have any difficulty performing the following activities: 11/13/2018  Hearing? N  Vision? N  Difficulty concentrating or making decisions? N  Comment some forgetfulness  Walking or climbing stairs? N  Dressing or bathing? N  Doing errands, shopping? N  Preparing Food and eating ? N  Using the Toilet? N  In the past six months, have you accidently leaked urine? N  Do you have problems with loss of bowel control? N  Managing your Medications? N  Managing your Finances? N  Housekeeping or managing your Housekeeping? N  Some recent data might be hidden    Patient Care Team:  Vivi Barrack, MD as PCP - General (Family Medicine) Fay Records, MD as PCP - Cardiology (Cardiology) Petrinitz, Dellis Filbert, DPM (Inactive) (Podiatry)    Assessment:   This is a routine wellness examination for Beth Holden.  Exercise Activities  and Dietary recommendations Current Exercise Habits: The patient does not participate in regular exercise at present  Goals    . Exercise 150 min/wk Moderate Activity     Will start with home plan when the doctor tells you your foot is healed;        Fall Risk Fall Risk  11/13/2018 12/23/2016 11/30/2015 12/26/2014 07/19/2013  Falls in the past year? 0 No No No No  Injury with Fall? 0 - - - -  Risk for fall due to : Impaired balance/gait - - - -  Follow up Falls evaluation completed;Education provided;Falls prevention discussed - - - -   Is the patient's home free of loose throw rugs in walkways, pet beds, electrical cords, etc?   yes      Grab bars in the bathroom? yes      Handrails on the stairs?   yes      Adequate lighting?   yes  Depression Screen PHQ 2/9 Scores 11/13/2018 12/23/2016 11/30/2015 12/26/2014  PHQ - 2 Score 0 0 0 1     Cognitive Function-no cognitive concerns at this time (patient education provided on forgetfulness)  MMSE - Mini Mental State Exam 12/23/2016  Not completed: (No Data)     6CIT Screen 11/13/2018  What Year? 0 points  What month? 0 points  What time? 0 points  Count back from 20 0 points  Months in reverse 0 points  Repeat phrase 0 points  Total Score 0    Immunization History  Administered Date(s) Administered  . Influenza Split 01/06/2011  . Influenza Whole 10/26/2007, 10/14/2008  . Influenza, High Dose Seasonal PF 10/26/2015, 11/08/2016, 11/30/2017  . Pneumococcal Conjugate-13 04/15/2013  . Pneumococcal Polysaccharide-23 02/04/2010, 11/30/2015  . Td 02/11/2010  . Zoster 02/04/2010    Qualifies for Shingles Vaccine?Discussed and patient will check with pharmacy for coverage.  Patient education handout provided    Screening Tests Health Maintenance  Topic Date Due  . FOOT EXAM  12/23/2017  . MAMMOGRAM  12/24/2017  . DEXA SCAN  12/24/2017  . INFLUENZA VACCINE  08/11/2018  . HEMOGLOBIN A1C  01/09/2019  . OPHTHALMOLOGY EXAM   10/30/2019  . TETANUS/TDAP  02/12/2020  . COLONOSCOPY  02/15/2022  . Hepatitis C Screening  Completed  . PNA vac Low Risk Adult  Completed    Cancer Screenings: Lung: Low Dose CT Chest recommended if Age 42-80 years, 30 pack-year currently smoking OR have quit w/in 15years. Patient does not qualify. Breast:  Up to date on Mammogram? No; ordered today Up to date of Bone Density/Dexa? Yes Colorectal: colonoscopy 02/15/17 with Dr. Fuller Plan     Plan:  I have personally reviewed and addressed the Medicare Annual Wellness questionnaire and have noted the following in the patient's chart:  A. Medical and social history B. Use of alcohol, tobacco or illicit drugs  C. Current medications and supplements D. Functional ability and status E.  Nutritional status F.  Physical activity G. Advance directives H. List of other physicians I.  Hospitalizations, surgeries, and ER visits in previous 12 months J.  [place] such as hearing and vision if needed, cognitive and depression L. Referrals, records requested, and appointments- none   In addition, I have reviewed and discussed with patient  certain preventive protocols, quality metrics, and best practice recommendations. A written personalized care plan for preventive services as well as general preventive health recommendations were provided to patient.   Signed,  Denman George, LPN  Nurse Health Advisor   Nurse Notes: Patient with concerns of continued sinus problems.  States that she has had intermittent headache x 2 weeks.  Completed antibiotic previously prescribed.  Is not taking Astelin, states that it causes her to have headache.  Sending patient education on proper administration of nasal sprays.  No fever, sinus drainage is clear.  Please advise.

## 2018-11-13 NOTE — Progress Notes (Signed)
I have personally reviewed the Medicare Annual Wellness Visit and agree with the assessment and plan.  Algis Greenhouse. Jerline Pain, MD 11/13/2018 12:12 PM

## 2018-11-13 NOTE — Patient Instructions (Addendum)
[name] , Thank you for taking time to come for your Medicare Wellness Visit. I appreciate your ongoing commitment to your health goals. Please review the following plan we discussed and let me know if I can assist you in the future.   Screening recommendations/referrals: Colorectal Screening: up to date; colonoscopy 02/15/17 Mammogram: ordered today; call [phone] to schedule Bone Density: up to date; last 12/25/15  Vision and Dental Exams: Recommended annual ophthalmology exams for early detection of glaucoma and other disorders of the eye Recommended annual dental exams for proper oral hygiene  Diabetic Exams: Diabetic Eye Exam: recommended yearly; up to date Diabetic Foot Exam: recommended yearly   Vaccinations: Influenza vaccine:  recommended this fall either at PCP office or through your local pharmacy  Pneumococcal vaccine: up to date; last 11/30/15 Tdap vaccine: up to date; last 02/11/10  Shingles vaccine: Please call your insurance company to determine your out of pocket expense for the Shingrix vaccine. You may receive this vaccine at your local pharmacy.  Advanced directives: Forms are available at the office if you decide to pursue this in the future.   Goals: Recommend to drink at least 6-8 8oz glasses of water per day and consume a balanced diet rich in fresh fruits and vegetables.   Next appointment: Please schedule your Annual Wellness Visit with your Nurse Health Advisor in one year.  Preventive Care 2 Years and Older, Female Preventive care refers to lifestyle choices and visits with your health care provider that can promote health and wellness. What does preventive care include?  A yearly physical exam. This is also called an annual well check.  Dental exams once or twice a year.  Routine eye exams. Ask your health care provider how often you should have your eyes checked.  Personal lifestyle choices, including:  Daily care of your teeth and gums.   Regular physical activity.  Eating a healthy diet.  Avoiding tobacco and drug use.  Limiting alcohol use.  Practicing safe sex.  Taking low-dose aspirin every day if recommended by your health care provider.  Taking vitamin and mineral supplements as recommended by your health care provider. What happens during an annual well check? The services and screenings done by your health care provider during your annual well check will depend on your age, overall health, lifestyle risk factors, and family history of disease. Counseling  Your health care provider may ask you questions about your:  Alcohol use.  Tobacco use.  Drug use.  Emotional well-being.  Home and relationship well-being.  Sexual activity.  Eating habits.  History of falls.  Memory and ability to understand (cognition).  Work and work Statistician.  Reproductive health. Screening  You may have the following tests or measurements:  Height, weight, and BMI.  Blood pressure.  Lipid and cholesterol levels. These may be checked every 5 years, or more frequently if you are over 72 years old.  Skin check.  Lung cancer screening. You may have this screening every year starting at age 43 if you have a 30-pack-year history of smoking and currently smoke or have quit within the past 15 years.  Fecal occult blood test (FOBT) of the stool. You may have this test every year starting at age 62.  Flexible sigmoidoscopy or colonoscopy. You may have a sigmoidoscopy every 5 years or a colonoscopy every 10 years starting at age 79.  Hepatitis C blood test.  Hepatitis B blood test.  Sexually transmitted disease (STD) testing.  Diabetes screening. This is  done by checking your blood sugar (glucose) after you have not eaten for a while (fasting). You may have this done every 1-3 years.  Bone density scan. This is done to screen for osteoporosis. You may have this done starting at age 56.  Mammogram. This may be  done every 1-2 years. Talk to your health care provider about how often you should have regular mammograms. Talk with your health care provider about your test results, treatment options, and if necessary, the need for more tests. Vaccines  Your health care provider may recommend certain vaccines, such as:  Influenza vaccine. This is recommended every year.  Tetanus, diphtheria, and acellular pertussis (Tdap, Td) vaccine. You may need a Td booster every 10 years.  Zoster vaccine. You may need this after age 80.  Pneumococcal 13-valent conjugate (PCV13) vaccine. One dose is recommended after age 21.  Pneumococcal polysaccharide (PPSV23) vaccine. One dose is recommended after age 54. Talk to your health care provider about which screenings and vaccines you need and how often you need them. This information is not intended to replace advice given to you by your health care provider. Make sure you discuss any questions you have with your health care provider. Document Released: 01/23/2015 Document Revised: 09/16/2015 Document Reviewed: 10/28/2014 Elsevier Interactive Patient Education  2017 [place] Prevention in the Home Falls can cause injuries. They can happen to people of all ages. There are many things you can do to make your home safe and to help prevent falls. What can I do on the outside of my home?  Regularly fix the edges of walkways and driveways and fix any cracks.  Remove anything that might make you trip as you walk through a door, such as a raised step or threshold.  Trim any bushes or trees on the path to your home.  Use bright outdoor lighting.  Clear any walking paths of anything that might make someone trip, such as rocks or tools.  Regularly check to see if handrails are loose or broken. Make sure that both sides of any steps have handrails.  Any raised decks and porches should have guardrails on the edges.  Have any leaves, snow, or ice cleared  regularly.  Use sand or salt on walking paths during winter.  Clean up any spills in your garage right away. This includes oil or grease spills. What can I do in the bathroom?  Use night lights.  Install grab bars by the toilet and in the tub and shower. Do not use towel bars as grab bars.  Use non-skid mats or decals in the tub or shower.  If you need to sit down in the shower, use a plastic, non-slip stool.  Keep the floor dry. Clean up any water that spills on the floor as soon as it happens.  Remove soap buildup in the tub or shower regularly.  Attach bath mats securely with double-sided non-slip rug tape.  Do not have throw rugs and other things on the floor that can make you trip. What can I do in the bedroom?  Use night lights.  Make sure that you have a light by your bed that is easy to reach.  Do not use any sheets or blankets that are too big for your bed. They should not hang down onto the floor.  Have a firm chair that has side arms. You can use this for support while you get dressed.  Do not have throw rugs and other things  on the floor that can make you trip. What can I do in the kitchen?  Clean up any spills right away.  Avoid walking on wet floors.  Keep items that you use a lot in easy-to-reach places.  If you need to reach something above you, use a strong step stool that has a grab bar.  Keep electrical cords out of the way.  Do not use floor polish or wax that makes floors slippery. If you must use wax, use non-skid floor wax.  Do not have throw rugs and other things on the floor that can make you trip. What can I do with my stairs?  Do not leave any items on the stairs.  Make sure that there are handrails on both sides of the stairs and use them. Fix handrails that are broken or loose. Make sure that handrails are as long as the stairways.  Check any carpeting to make sure that it is firmly attached to the stairs. Fix any carpet that is loose  or worn.  Avoid having throw rugs at the top or bottom of the stairs. If you do have throw rugs, attach them to the floor with carpet tape.  Make sure that you have a light switch at the top of the stairs and the bottom of the stairs. If you do not have them, ask someone to add them for you. What else can I do to help prevent falls?  Wear shoes that:  Do not have high heels.  Have rubber bottoms.  Are comfortable and fit you well.  Are closed at the toe. Do not wear sandals.  If you use a stepladder:  Make sure that it is fully opened. Do not climb a closed stepladder.  Make sure that both sides of the stepladder are locked into place.  Ask someone to hold it for you, if possible.  Clearly mark and make sure that you can see:  Any grab bars or handrails.  First and last steps.  Where the edge of each step is.  Use tools that help you move around (mobility aids) if they are needed. These include:  Canes.  Walkers.  Scooters.  Crutches.  Turn on the lights when you go into a dark area. Replace any light bulbs as soon as they burn out.  Set up your furniture so you have a clear path. Avoid moving your furniture around.  If any of your floors are uneven, fix them.  If there are any pets around you, be aware of where they are.  Review your medicines with your doctor. Some medicines can make you feel dizzy. This can increase your chance of falling. Ask your doctor what other things that you can do to help prevent falls. This information is not intended to replace advice given to you by your health care provider. Make sure you discuss any questions you have with your health care provider. Document Released: 10/23/2008 Document Revised: 06/04/2015 Document Reviewed: 01/31/2014 Elsevier Interactive Patient Education  2017 [employer].

## 2018-11-21 ENCOUNTER — Ambulatory Visit: Payer: Medicare Other | Admitting: Internal Medicine

## 2018-11-22 ENCOUNTER — Inpatient Hospital Stay: Payer: Medicare Other | Attending: Nurse Practitioner

## 2018-11-22 ENCOUNTER — Other Ambulatory Visit: Payer: Self-pay

## 2018-11-22 ENCOUNTER — Telehealth: Payer: Self-pay | Admitting: Oncology

## 2018-11-22 ENCOUNTER — Inpatient Hospital Stay (HOSPITAL_BASED_OUTPATIENT_CLINIC_OR_DEPARTMENT_OTHER): Payer: Medicare Other | Admitting: Nurse Practitioner

## 2018-11-22 ENCOUNTER — Encounter: Payer: Self-pay | Admitting: Nurse Practitioner

## 2018-11-22 VITALS — BP 143/59 | HR 78 | Temp 98.2°F | Resp 17 | Ht 65.0 in | Wt 228.3 lb

## 2018-11-22 DIAGNOSIS — D509 Iron deficiency anemia, unspecified: Secondary | ICD-10-CM | POA: Insufficient documentation

## 2018-11-22 DIAGNOSIS — E119 Type 2 diabetes mellitus without complications: Secondary | ICD-10-CM | POA: Diagnosis not present

## 2018-11-22 DIAGNOSIS — I10 Essential (primary) hypertension: Secondary | ICD-10-CM | POA: Insufficient documentation

## 2018-11-22 DIAGNOSIS — D508 Other iron deficiency anemias: Secondary | ICD-10-CM

## 2018-11-22 LAB — CBC WITH DIFFERENTIAL (CANCER CENTER ONLY)
Abs Immature Granulocytes: 0.03 10*3/uL (ref 0.00–0.07)
Basophils Absolute: 0.1 10*3/uL (ref 0.0–0.1)
Basophils Relative: 1 %
Eosinophils Absolute: 0.3 10*3/uL (ref 0.0–0.5)
Eosinophils Relative: 3 %
HCT: 40.3 % (ref 36.0–46.0)
Hemoglobin: 12.7 g/dL (ref 12.0–15.0)
Immature Granulocytes: 0 %
Lymphocytes Relative: 20 %
Lymphs Abs: 2.2 10*3/uL (ref 0.7–4.0)
MCH: 27.4 pg (ref 26.0–34.0)
MCHC: 31.5 g/dL (ref 30.0–36.0)
MCV: 86.9 fL (ref 80.0–100.0)
Monocytes Absolute: 0.6 10*3/uL (ref 0.1–1.0)
Monocytes Relative: 5 %
Neutro Abs: 7.7 10*3/uL (ref 1.7–7.7)
Neutrophils Relative %: 71 %
Platelet Count: 380 10*3/uL (ref 150–400)
RBC: 4.64 MIL/uL (ref 3.87–5.11)
RDW: 12.9 % (ref 11.5–15.5)
WBC Count: 10.8 10*3/uL — ABNORMAL HIGH (ref 4.0–10.5)
nRBC: 0 % (ref 0.0–0.2)

## 2018-11-22 LAB — FERRITIN: Ferritin: 86 ng/mL (ref 11–307)

## 2018-11-22 NOTE — Telephone Encounter (Signed)
Patient decline avs and calendar °

## 2018-11-22 NOTE — Progress Notes (Signed)
  [facility] OFFICE PROGRESS NOTE   Diagnosis: Iron deficiency anemia  INTERVAL HISTORY:   Ms. Rapoport returns as scheduled.  She discontinued oral iron 2 months ago due to abdominal pain.  The pain resolved soon after she discontinued iron.  She is not aware of any bleeding.  She has a good appetite.  No nausea or vomiting.  Objective:  Vital signs in last 24 hours:  Blood pressure (!) 143/59, pulse 78, temperature 98.2 F (36.8 C), temperature source Temporal, resp. rate 17, height 5\' 5"  (1.651 m), weight 228 lb 4.8 oz (103.6 kg), SpO2 95 %.    HEENT: No thrush or ulcers. GI: Abdomen soft and nontender.  No hepatomegaly. Vascular: No leg edema. Neuro: Alert and oriented.   Lab Results:  Lab Results  Component Value Date   WBC 10.8 (H) 11/22/2018   HGB 12.7 11/22/2018   HCT 40.3 11/22/2018   MCV 86.9 11/22/2018   PLT 380 11/22/2018   NEUTROABS 7.7 11/22/2018    Imaging:  No results found.  Medications: I have reviewed the patient's current medications.  Assessment/Plan: 1. Anemia secondary to iron deficiency, possible GI blood loss related to gastritis status postIVferrous gluconate weekly x4 beginning 03/13/2017, single dose of IV ferrous gluconate 05/22/2017. Hemoglobin/MCV corrected into normal range. 2. Upper endoscopy 02/15/2017-diffuse moderate inflammation characterized by congestion, erythema, friability and granularity in the gastric body, posterior wall of the stomach and gastric antrum. Biopsy GASTRIC ANTRAL MUCOSA WITH NON-SPECIFIC REACTIVE GASTROPATHY AND FEW SUBEPITHELIAL CRYSTALLINEIRON DEPOSITS, CONSISTENT WITH IRON PILL GASTROPATHY. GASTRIC OXYNTIC MUCOSA WITH PARIETAL CELL HYPERPLASIA AS CAN BE SEEN IN HYPERGASTRINEMIC STATESSUCH AS PPI THERAPY.WARTHIN-STARRY STAIN IS NEGATIVE FOR HELICOBACTER PYLORI 3. Colonoscopy 02/15/2017-6 mm polyp in the transverse colon (TUBULAR ADENOMA. NEGATIVE FOR HIGH GRADE DYSPLASIA OR MALIGNANCY) 4. Diabetes  5. Hypertension 6. 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 Small hemoglobin on repeat urinalyses-seen by urology.   Disposition: Ms. Michon appears stable.  She discontinued oral iron 2 to 3 months ago due to abdominal pain.  The pain has resolved.  We reviewed the CBC from today.  Hemoglobin remains in normal range.  We will follow-up on the ferritin from today.  She will return for a CBC in 3 months since she is now off of oral iron.  We will see her in follow-up in 6 months.  She will contact the office in the interim with any problems.  We specifically discussed signs/symptoms suggestive of progressive anemia.    Ned Card ANP/GNP-BC   11/22/2018  9:14 AM

## 2018-11-23 ENCOUNTER — Ambulatory Visit (INDEPENDENT_AMBULATORY_CARE_PROVIDER_SITE_OTHER): Payer: Medicare Other | Admitting: Internal Medicine

## 2018-11-23 ENCOUNTER — Other Ambulatory Visit: Payer: Self-pay

## 2018-11-23 ENCOUNTER — Encounter: Payer: Self-pay | Admitting: Internal Medicine

## 2018-11-23 VITALS — BP 130/80 | HR 70 | Ht 65.0 in | Wt 228.0 lb

## 2018-11-23 DIAGNOSIS — E785 Hyperlipidemia, unspecified: Secondary | ICD-10-CM | POA: Diagnosis not present

## 2018-11-23 DIAGNOSIS — E1151 Type 2 diabetes mellitus with diabetic peripheral angiopathy without gangrene: Secondary | ICD-10-CM | POA: Diagnosis not present

## 2018-11-23 DIAGNOSIS — E1169 Type 2 diabetes mellitus with other specified complication: Secondary | ICD-10-CM

## 2018-11-23 DIAGNOSIS — E1165 Type 2 diabetes mellitus with hyperglycemia: Secondary | ICD-10-CM | POA: Diagnosis not present

## 2018-11-23 DIAGNOSIS — IMO0002 Reserved for concepts with insufficient information to code with codable children: Secondary | ICD-10-CM

## 2018-11-23 DIAGNOSIS — G63 Polyneuropathy in diseases classified elsewhere: Secondary | ICD-10-CM

## 2018-11-23 LAB — LIPID PANEL
Cholesterol: 139 mg/dL (ref 0–200)
HDL: 43.6 mg/dL (ref >39.00)
LDL Cholesterol: 73 mg/dL (ref 0–99)
NonHDL: 95.3
Total CHOL/HDL Ratio: 3
Triglycerides: 111 mg/dL (ref 0.0–149.0)
VLDL: 22.2 mg/dL (ref 0.0–40.0)

## 2018-11-23 LAB — POCT GLYCOSYLATED HEMOGLOBIN (HGB A1C): Hemoglobin A1C: 7.4 % — AB (ref 4.0–5.6)

## 2018-11-23 LAB — MICROALBUMIN / CREATININE URINE RATIO
Creatinine,U: 90.2 mg/dL
Microalb Creat Ratio: 1 mg/g (ref 0.0–30.0)
Microalb, Ur: 0.9 mg/dL (ref 0.0–1.9)

## 2018-11-23 NOTE — Progress Notes (Signed)
Subjective:     Patient ID: Beth Holden, female   DOB: [birth year], 73 y.o.   MRN: [id number]  HPI Beth Holden is a pleasant 73 y.o. woman returning for f/u for DM2, dx ~2000, uncontrolled, insulin-dependent, with complications (diabetic peripheral neuropathy, multiple toe amputations). Last visit 3 months ago.  At last visit, I saw her soon after her brother committed suicide >> she was very affected by this and sugars were also higher, however, right before visit, sugars started to improve after she started a low-carb diet.  However, she could not continue as she did not feel good on the diet.  Reviewed HbA1c levels: Lab Results  Component Value Date   HGBA1C 7.4 (A) 07/10/2018   HGBA1C 6.6 (A) 11/28/2017   HGBA1C 7.0 (A) 07/28/2017   She is on:: - Metformin 1000 mg 2x a day Insulin Before breakfast Before lunch Before dinner At bedtime  Regular 15-20 >> 20-25 5-10 units 15-20 >> 10-15   NPH 10 >> 15 x x 10   She also tried: Levemir 47 units in am and 37 units in pm >> $800 for 3 mo supply Januvia 100 mg daily - $450 for 3 mo Invokana 100 mg - $455 for 3 mo Took Byetta before We discussed about starting her on a VGo mechanical pump in the past. She had an appointment with diabetes education but decided not to pursue it. We also tried a sliding scale of regular insulin in the past but she was not using it. We tried Trulicity but she could not tolerate it due to nausea, diarrhea, constipation, weakness.  She checks her sugars 4 times a day: - am: 137-159, 200, 203  >> 90s, 120-140s, 220, 242 - 2h after b'fast :  132-242 >> 105-208 >> 155-255 >> n/c - prelunch:  70-196 >> 164, 236 >> 78, 188-274 >> 100-200s - 2-3h after lunch: 110-234, 255 >> 61-351 >> 66, 101-231 >> n/c - before dinner: 108-170, 277 >> 88-151, 276 >> 140-200s - after dinner: 148-249 >> 177, 186 >> 82-176, 290 >> n/c - bedtime: 134-224, 340 >> 78-178, 290 >> 180-200s, 300s - nighttime: 70-111, 220 >> 48, 71-172,  273 >> 40's, 110-300s Lowest: 47 >> 48 >> 48 >> 40s x3 (did not eat supper).  Has hypoglycemia awareness in the 70s. Highest: 405 (Prednisone) >> 340 >> 342 in 05/2018 >> n/c.  Meals: - Breakfast: egg, bacon, toast; grits; biscuit; fruit; sometimes skips - Lunch: 1/2 PB sandwich or soup and yoghurt, sometimes crackers - Dinner: meat + vegetables + some starch - Snacks: fruit   No CKD: BUN  Date Value Ref Range Status  05/19/2017 14 7 - 26 mg/dL Final   Creatinine  Date Value Ref Range Status  05/19/2017 0.88 0.60 - 1.10 mg/dL Final  On irbesrtan enalapril.  -+ HL; latest lipid panel: Lab Results  Component Value Date   CHOL 184 11/30/2017   HDL 48.40 11/30/2017   LDLCALC 111 (H) 11/30/2017   TRIG 122.0 11/30/2017   CHOLHDL 4 11/30/2017  On rosuvastatin 20. - last eye exam in 10/2018: + Mild DR, no glaucoma, + cataracts - low grade (Dr. Zadie Rhine). -She has numbness and tingling in her feet-previously on amitriptyline, now on Neurontin  Review of Systems  Constitutional: no weight gain/+ weight loss, no fatigue, no subjective hyperthermia, no subjective hypothermia, + nocturia Eyes: no blurry vision, no xerophthalmia ENT: no sore throat, no nodules palpated in neck, no dysphagia, no odynophagia, no hoarseness Cardiovascular: no CP/no SOB/no  palpitations/no leg swelling Respiratory: no cough/no SOB/no wheezing Gastrointestinal: no N/no V/no D/no C/no acid reflux Musculoskeletal: no muscle aches/no joint aches Skin: no rashes, no hair loss Neurological: no tremors/+ numbness/+ tingling/no dizziness  I reviewed pt's medications, allergies, PMH, social hx, family hx, and changes were documented in the history of present illness. Otherwise, unchanged from my initial visit note.  Past Medical History:  Diagnosis Date  . Alopecia   . Anemia, mild   . Arthritis   . Chronic cough    sees pulmonologist  . Chronic pain    chest wall and abd - s/p extensive eval  . Diabetes  mellitus with neuropathy ([place])    sees endocrine  . Diverticulosis   . Fatty liver   . GERD (gastroesophageal reflux disease)    takes Nexium bid, hx erosive esophagitis  . Headache(784.0)    occasionally;r/t sinus   . History of colon polyps   . HTN (hypertension)   . Hx of amputation of lesser toe ([place])    sees podiatrist  . Hyperlipemia   . IBS (irritable bowel syndrome)   . Insomnia    takes Elavil nightly  . Joint pain   . Neuropathy   . Osteomyelitis ([place])   . Pneumonia   . PONV (postoperative nausea and vomiting)   . Seasonal allergies    takes Zyrtec daily  . Sinus tachycardia    Past Surgical History:  Procedure Laterality Date  . AMPUTATION  12/28/2011   Procedure: AMPUTATION DIGIT;  Surgeon: Newt Minion, MD;  Location: [facility];  Service: Orthopedics;  Laterality: Right;  Right Foot 2nd Toe Amputation at MTP (metatarsophalangeal joint)  . AMPUTATION Right 04/25/2012   Procedure: Right Foot 3rd Toe Amputation;  Surgeon: Newt Minion, MD;  Location: [facility];  Service: Orthopedics;  Laterality: Right;  Right Foot Third Toe Amputation   . AMPUTATION Right 07/27/2012   Procedure: Right 4th Toe Amputation at Metatarsophalangeal;  Surgeon: Newt Minion, MD;  Location: [facility];  Service: Orthopedics;  Laterality: Right;  Right 4th Toe Amputation at Metatarsophalangeal  . AMPUTATION Left 07/15/2016   Procedure: Left 2nd Ray Amputation;  Surgeon: Newt Minion, MD;  Location: [facility];  Service: Orthopedics;  Laterality: Left;  . AMPUTATION Left 11/18/2016   Procedure: Left 3rd and 4th Ray Amputation;  Surgeon: Newt Minion, MD;  Location: [facility];  Service: Orthopedics;  Laterality: Left;  . AMPUTATION Right 03/29/2017   Procedure: RIGHT FOOT 3RD AND 4TH RAY AMPUTATION;  Surgeon: Newt Minion, MD;  Location: [facility];  Service: Orthopedics;  Laterality: Right;  . COLONOSCOPY    . LAPAROSCOPIC APPENDECTOMY  01/05/2011   Procedure: APPENDECTOMY LAPAROSCOPIC;  Surgeon: Pedro Earls,  MD;  Location: [facility];  Service: General;  Laterality: N/A;  . OOPHORECTOMY  2001  . ROTATOR CUFF REPAIR Right    x 2  . TUBAL LIGATION    . VAGINAL HYSTERECTOMY  2001   Social History   Socioeconomic History  . Marital status: Married    Spouse name: Not on file  . Number of children: 2  . Years of education: Not on file  . Highest education level: Not on file  Occupational History  . Occupation: Retired   Scientific laboratory technician  . Financial resource strain: Not on file  . Food insecurity    Worry: Not on file    Inability: Not on file  . Transportation needs    Medical: Not on file  Non-medical: Not on file  Tobacco Use  . Smoking status: Never Smoker  . Smokeless tobacco: Never Used  Substance and Sexual Activity  . Alcohol use: No  . Drug use: No  . Sexual activity: Yes    Birth control/protection: Surgical  Lifestyle  . Physical activity    Days per week: Not on file    Minutes per session: Not on file  . Stress: Not on file  Relationships  . Social Herbalist on phone: Not on file    Gets together: Not on file    Attends religious service: Not on file    Active member of club or organization: Not on file    Attends meetings of clubs or organizations: Not on file    Relationship status: Not on file  . Intimate partner violence    Fear of current or ex partner: Not on file    Emotionally abused: Not on file    Physically abused: Not on file    Forced sexual activity: Not on file  Other Topics Concern  . Not on file  Social History Narrative   Caffeine daily    [employer], [employer] no diploma   Married '66   1 dtr- '78; 1 son '71; 2 grandchildren   Occupation: retired 04   Dad with alzheimers-had to place in [place] (summer '10)         Current Outpatient Medications on File Prior to Visit  Medication Sig Dispense Refill  . acetaminophen (TYLENOL) 500 MG tablet Take 500 mg by mouth every 8 (eight) hours as needed for mild pain.     Marland Kitchen azelastine (ASTELIN) 0.1 %  nasal spray Place 2 sprays into both nostrils 2 (two) times daily. 30 mL 12  . betamethasone dipropionate (DIPROLENE) 0.05 % cream Apply 1 application topically 2 (two) times daily as needed (IRRITATION). 30 g 0  . Blood Glucose Monitoring Suppl (ONE TOUCH ULTRA 2) w/Device KIT Use to check blood sugar 1 each 0  . cetirizine (EQ ALLERGY RELIEF, CETIRIZINE,) 10 MG tablet Take 1 tablet (10 mg total) by mouth daily. 90 tablet 1  . cholecalciferol (VITAMIN D) 1000 units tablet Take 1 tablet by mouth daily.     Marland Kitchen gabapentin (NEURONTIN) 100 MG capsule TAKE 2-3 CAPSULES BY MOUTH AT BEDTIME 180 capsule 3  . glucagon (GLUCAGEN) 1 MG SOLR injection Inject 1 mg into the muscle once as needed for up to 1 dose for low blood sugar. 1 each 11  . glucose blood test strip Use to check blood sugar three times a day; E11.51 300 each 2  . Hydrocortisone Butyrate 0.1 % SOLN Place 1-2 drops daily as needed. 60 mL 0  . insulin NPH Human (NOVOLIN N RELION) 100 UNIT/ML injection Inject under skin 15 units in am and 10 units at bedtime 20 mL 3  . Insulin Syringe-Needle U-100 (RELION INSULIN SYRINGE 1ML/31G) 31G X 5/16" 1 ML MISC USE 2 TIMES A DAY 100 each 2  . irbesartan (AVAPRO) 75 MG tablet Take 1 tablet (75 mg total) by mouth daily. 90 tablet 1  . metFORMIN (GLUCOPHAGE) 1000 MG tablet TAKE 1 TABLET TWICE A DAY WITH A MEAL 180 tablet 3  . metoprolol succinate (TOPROL-XL) 25 MG 24 hr tablet Take 1 tablet (25 mg total) by mouth daily. 90 tablet 2  . NOVOLIN R RELION 100 UNIT/ML injection INJECT 12 TO 30 UNITS SUBCUTANEOUSLY THREE TIMES DAILY BEFORE MEAL(S) 30 mL 0  . omeprazole (PRILOSEC) 40  MG capsule Take 2 capsules (80 mg total) by mouth 2 (two) times daily. 180 capsule 3  . OVER THE COUNTER MEDICATION Apply 1-3 application topically at bedtime. TOPRICIN FOOT CREAM - NEUROPATHY FOOT CREAM **APPLIES TO BOTH FEET AT BEDTIME**    . Probiotic Product (PROBIOTIC DAILY PO) Take 1 capsule by mouth daily.    . psyllium  (METAMUCIL) 58.6 % packet Take 1 packet by mouth daily as needed.     . rosuvastatin (CRESTOR) 20 MG tablet Take 1 tablet (20 mg total) by mouth daily. 90 tablet 1  . vitamin B-12 (CYANOCOBALAMIN) 1000 MCG tablet Take 1,000 mcg daily by mouth.     No current facility-administered medications on file prior to visit.    Allergies  Allergen Reactions  . Codeine Other (See Comments)    Makes her crazy  . Propoxyphene Hcl Itching    *DARVOCET    Family History  Problem Relation Age of Onset  . Lung cancer Mother 75       smoked heavily  . Emphysema Mother   . Hypertension Father   . Hyperlipidemia Father   . Diabetes Father   . Coronary artery disease Father   . Dementia Father   . COPD Father        smoked  . Stomach cancer Paternal Aunt   . Brain cancer Paternal Uncle   . Irritable bowel syndrome Other        Several family members on fathers side   . Diabetes Other   . Stomach cancer Maternal Aunt   . Lung cancer Paternal Uncle   . Heart disease Paternal Uncle   . Colon cancer Neg Hx     Objective:   Physical Exam BP 130/80   Pulse 70   Ht '5\' 5"'  (1.651 m)   Wt 228 lb (103.4 kg)   SpO2 96%   BMI 37.94 kg/m  Body mass index is 37.94 kg/m. 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 DM2, uncontrolled, insulin-dependent, with complications - diabetic peripheral neuropathy - 3 toe amputations - after  infected diabetic toe ulcers >> OM:  R 2nd toe amputated on 12/28/2011  R 3rd toe amputated on 04/25/2012  R 4th toe amputated on 07/27/2012  L 2nd toe amputated on 07/15/2016  L1st and 5th toes amputated 11/18/2016    2. 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 HL  Plan:     1. DM2 - pt with longstanding, uncontrolled, type 2 diabetes, on basal-bolus insulin regimen with NPH and regular insulin due to price, and also on Metformin.  Sugars are usually very fluctuating, consistent with insulin deficiency.  We did not check her for type 1 diabetes since this would not change her management.  We discussed in the past about doing her meals for better control of her diabetes as she was skipping meals or having meals close together.  She does have a glucagon prescription at home.  She has been and she  and her husband know how to use it. -At last visit, she was not using the insulin doses at that I suggested that the previous visit.  She was taking higher dose of insulin and continued to have low blood sugars at night.  I again advised him to decrease the insulin with dinner and we also increased her insulin before breakfast since sugars after breakfast and before lunch are higher.  At last visit she started a low-carb diet and sugar started to be improved.  At that time, I suggested a CGM.  I gave her a list of suppliers.  She did not try to contact. - lost 4 lbs since last visit, despite stopping her low-carb diet -At this visit, sugars are still fluctuating but definitely improved in the morning.  Later in the day, they do not follow particular pattern, but they are usually higher.  We discussed about increasing NPH insulin in the morning. -She still occasionally has low blood sugars at night, in the 40s.  These usually happen after injecting regular insulin before dinner and not eating everything that she plans to eat.  We discussed about using a lower dose of regular insulin in the situations. - I advised her  to: Patient Instructions   Please continue: - Metformin 1000 mg 2x a day Insulin Before breakfast Before lunch Before dinner At bedtime  Regular 20-25 5-10 10-15   NPH 15 >> 20 x x 10   Please return in 4 months with your sugar log  - we checked her HbA1c: 7.4% (stable) - advised to check sugars at different times of the day - 4x a day, rotating check times - advised for yearly eye exams >> she is UTD - will check annual labs today - return to clinic in 3-4 months  2. PN -Due to her diabetes -She has numbness and tingling mostly in her left big toe -She is off amitriptyline but continues on Neurontin, refilled at last visit.  She takes 2 to 3 capsules at night  3. HL -Reviewed latest lipid panel from 11/2017: LDL above target, the other fractions at goal Lab Results  Component Value Date   CHOL 184 11/30/2017   HDL 48.40 11/30/2017   LDLCALC 111 (H) 11/30/2017   TRIG 122.0 11/30/2017   CHOLHDL 4 11/30/2017  -Continues Crestor 20 without side effects  Component     Latest Ref Rng & Units 11/23/2018  Glucose     65 - 99 mg/dL 125 (H)  BUN     7 - 25 mg/dL 18  Creatinine     0.60 - 0.93 mg/dL 0.87  GFR, Est Non African American     > OR = 60 mL/min/1.34m 67  GFR, Est African American     > OR = 60 mL/min/1.725m78  BUN/Creatinine Ratio     6 - 22 (calc) NOT APPLICABLE  Sodium     13353 146 mmol/L 140  Potassium     3.5 - 5.3 mmol/L 4.5  Chloride     98 - 110 mmol/L 101  CO2     20 - 32 mmol/L 29  Calcium     8.6 - 10.4 mg/dL 9.6  Total Protein     6.1 - 8.1 g/dL 6.8  Albumin MSPROF     3.6 - 5.1 g/dL 4.0  Globulin     1.9 - 3.7 g/dL (calc) 2.8  AG Ratio     1.0 - 2.5 (calc) 1.4  Total Bilirubin  0.2 - 1.2 mg/dL 0.4  Alkaline phosphatase (APISO)     37 - 153 U/L 46  AST     10 - 35 U/L 17  ALT     6 - 29 U/L 9  Cholesterol     0 - 200 mg/dL 139  Triglycerides     0.0 - 149.0 mg/dL 111.0  HDL Cholesterol     >39.00 mg/dL 43.60  VLDL      0.0 - 40.0 mg/dL 22.2  LDL (calc)     0 - 99 mg/dL 73  Total CHOL/HDL Ratio      3  NonHDL      95.30  Microalb, Ur     0.0 - 1.9 mg/dL 0.9  Creatinine,U     mg/dL 90.2  MICROALB/CREAT RATIO     0.0 - 30.0 mg/g 1.0  LDL much improved.  ACR normal.  The rest of the labs are at goal.  Philemon Kingdom, MD PhD [facility]

## 2018-11-23 NOTE — Patient Instructions (Signed)
Please continue: - Metformin 1000 mg 2x a day Insulin Before breakfast Before lunch Before dinner At bedtime  Regular 20-25 5-10 10-15   NPH 15 >> 20 x x 10   Please return in 4 months with your sugar log

## 2018-11-24 LAB — COMPLETE METABOLIC PANEL WITH GFR
AG Ratio: 1.4 (calc) (ref 1.0–2.5)
ALT: 9 U/L (ref 6–29)
AST: 17 U/L (ref 10–35)
Albumin: 4 g/dL (ref 3.6–5.1)
Alkaline phosphatase (APISO): 46 U/L (ref 37–153)
BUN: 18 mg/dL (ref 7–25)
CO2: 29 mmol/L (ref 20–32)
Calcium: 9.6 mg/dL (ref 8.6–10.4)
Chloride: 101 mmol/L (ref 98–110)
Creat: 0.87 mg/dL (ref 0.60–0.93)
GFR, Est African American: 78 mL/min/{1.73_m2} (ref >=60)
GFR, Est Non African American: 67 mL/min/{1.73_m2} (ref >=60)
Globulin: 2.8 g/dL (calc) (ref 1.9–3.7)
Glucose, Bld: 125 mg/dL — ABNORMAL HIGH (ref 65–99)
Potassium: 4.5 mmol/L (ref 3.5–5.3)
Sodium: 140 mmol/L (ref 135–146)
Total Bilirubin: 0.4 mg/dL (ref 0.2–1.2)
Total Protein: 6.8 g/dL (ref 6.1–8.1)

## 2018-12-11 DIAGNOSIS — Z23 Encounter for immunization: Secondary | ICD-10-CM | POA: Diagnosis not present

## 2018-12-12 DIAGNOSIS — H25813 Combined forms of age-related cataract, bilateral: Secondary | ICD-10-CM | POA: Diagnosis not present

## 2018-12-12 DIAGNOSIS — H43811 Vitreous degeneration, right eye: Secondary | ICD-10-CM | POA: Diagnosis not present

## 2018-12-12 DIAGNOSIS — E119 Type 2 diabetes mellitus without complications: Secondary | ICD-10-CM | POA: Diagnosis not present

## 2018-12-12 DIAGNOSIS — Z794 Long term (current) use of insulin: Secondary | ICD-10-CM | POA: Diagnosis not present

## 2018-12-12 LAB — HM DIABETES EYE EXAM

## 2018-12-13 ENCOUNTER — Other Ambulatory Visit: Payer: Self-pay | Admitting: Internal Medicine

## 2018-12-13 DIAGNOSIS — IMO0002 Reserved for concepts with insufficient information to code with codable children: Secondary | ICD-10-CM

## 2018-12-13 DIAGNOSIS — E1151 Type 2 diabetes mellitus with diabetic peripheral angiopathy without gangrene: Secondary | ICD-10-CM

## 2018-12-14 ENCOUNTER — Encounter: Payer: Self-pay | Admitting: Internal Medicine

## 2018-12-15 ENCOUNTER — Other Ambulatory Visit: Payer: Self-pay | Admitting: Internal Medicine

## 2018-12-25 ENCOUNTER — Encounter: Payer: Self-pay | Admitting: Internal Medicine

## 2018-12-25 MED ORDER — INSULIN NPH (HUMAN) (ISOPHANE) 100 UNIT/ML ~~LOC~~ SUSP: SUBCUTANEOUS | 3 refills | Status: DC

## 2018-12-28 ENCOUNTER — Telehealth: Payer: Self-pay

## 2018-12-28 MED ORDER — INSULIN REGULAR HUMAN 100 UNIT/ML IJ SOLN: INTRAMUSCULAR | 1 refills | Status: DC

## 2018-12-28 NOTE — Telephone Encounter (Signed)
MEDICATION: NOVOLIN R RELION 100 UNIT/ML injection  PHARMACY:  [facility] [address], [address]  IS THIS A 90 DAY SUPPLY : yes  IS PATIENT OUT OF MEDICATION:   IF NOT; HOW MUCH IS LEFT:   LAST APPOINTMENT DATE: @12 /03/2018  NEXT APPOINTMENT DATE:@3 /17/2021  DO WE HAVE YOUR PERMISSION TO LEAVE A DETAILED MESSAGE:  OTHER COMMENTS:    **Let patient know to contact pharmacy at the end of the day to make sure medication is ready. **  ** Please notify patient to allow 48-72 hours to process**  **Encourage patient to contact the pharmacy for refills or they can request refills through [facility]**

## 2018-12-28 NOTE — Telephone Encounter (Signed)
RX sent

## 2019-01-01 ENCOUNTER — Ambulatory Visit (INDEPENDENT_AMBULATORY_CARE_PROVIDER_SITE_OTHER): Payer: Medicare Other | Admitting: Orthopedic Surgery

## 2019-01-01 ENCOUNTER — Ambulatory Visit
Admission: RE | Admit: 2019-01-01 | Discharge: 2019-01-01 | Disposition: A | Discharge status: Home / Self Care | Payer: Medicare Other | Source: Ambulatory Visit | Attending: Family Medicine | Admitting: Family Medicine

## 2019-01-01 ENCOUNTER — Encounter: Payer: Self-pay | Admitting: Orthopedic Surgery

## 2019-01-01 ENCOUNTER — Other Ambulatory Visit: Payer: Self-pay

## 2019-01-01 VITALS — Ht 65.0 in | Wt 228.0 lb

## 2019-01-01 DIAGNOSIS — L97521 Non-pressure chronic ulcer of other part of left foot limited to breakdown of skin: Secondary | ICD-10-CM

## 2019-01-01 DIAGNOSIS — Z1231 Encounter for screening mammogram for malignant neoplasm of breast: Secondary | ICD-10-CM

## 2019-01-02 ENCOUNTER — Encounter: Payer: Self-pay | Admitting: Orthopedic Surgery

## 2019-01-02 NOTE — Progress Notes (Signed)
Office Visit Note   [name]           Date of Birth: 1945-10-17           MRN: [id number] Visit Date: 01/01/2019              Requested by: Vivi Barrack, MD [address] [place],  [place] [address] PCP: Vivi Barrack, MD  Chief Complaint  Patient presents with  . Left Foot - Follow-up  . Right Foot - Follow-up      HPI: This is a pleasant woman who follows up for routine callus trimming and toenail trimming.  She has no other concerns.  Assessment & Plan: Visit Diagnoses: No diagnosis found.  Plan: She may follow-up in another 2 to 3 months or as needed  Follow-Up Instructions: No follow-ups on file.   Ortho Exam  Patient is alert, oriented, no adenopathy, well-dressed, normal affect, normal respiratory effort. On both feet she is status post second third and fourth toe amputations.  On the left foot she has a medial plantar callus which was trimmed to healthy surfaces.  Her first and fifth toenails were also trimmed on both feet  Imaging: No results found. No images are attached to the encounter.  Labs: Lab Results  Component Value Date   HGBA1C 7.4 (A) 11/23/2018   HGBA1C 7.4 (A) 07/10/2018   HGBA1C 6.6 (A) 11/28/2017   ESRSEDRATE 36 (H) 02/24/2017   CRP 1.2 (H) 02/24/2017   LABURIC 5.5 09/01/2015   LABORGA GROUP B STREP (S.AGALACTIAE) ISOLATED 12/26/2014     Lab Results  Component Value Date   ALBUMIN 3.8 05/19/2017   ALBUMIN 3.6 04/21/2017   ALBUMIN 3.6 02/24/2017   PREALBUMIN 19.4 03/05/2012   LABURIC 5.5 09/01/2015    No results found for: MG No results found for: VD25OH  Lab Results  Component Value Date   PREALBUMIN 19.4 03/05/2012   CBC EXTENDED Latest Ref Rng & Units 11/22/2018 05/22/2018 01/17/2018  WBC 4.0 - 10.5 K/uL 10.8(H) 11.6(H) 11.7(H)  RBC 3.87 - 5.11 MIL/uL 4.64 4.54 4.63  HGB 12.0 - 15.0 g/dL 12.7 12.4 12.6  HCT 36.0 - 46.0 % 40.3 40.4 40.1  PLT 150 - 400 K/uL 380 361 372  NEUTROABS 1.7 - 7.7 K/uL 7.7  8.4(H) 8.2(H)  LYMPHSABS 0.7 - 4.0 K/uL 2.2 2.3 2.5     Body mass index is 37.94 kg/m.  Orders:  No orders of the defined types were placed in this encounter.  No orders of the defined types were placed in this encounter.    Procedures: No procedures performed  Clinical Data: No additional findings.  ROS:  All other systems negative, except as noted in the HPI. Review of Systems  Objective: Vital Signs: Ht 5\' 5"  (1.651 m)   Wt 228 lb (103.4 kg)   BMI 37.94 kg/m   Specialty Comments:  No specialty comments available.  PMFS History: Patient Active Problem List   Diagnosis Date Noted  . Hyperlipidemia associated with type 2 diabetes mellitus ([place]) 11/30/2017  . Impingement syndrome of right shoulder 07/06/2017  . Iron deficiency anemia 05/11/2017  . Morbid obesity ([place]) 04/24/2017  . Anemia 02/24/2017  . Baker's cyst 07/10/2016  . Onychomycosis 12/07/2015  . Upper airway cough syndrome 03/05/2014  . History of amputation of lesser toe of right foot ([place]) 04/15/2013  . Hypertension associated with diabetes ([place]) 12/05/2006  . Uncontrolled type 2 diabetes mellitus with peripheral circulatory disorder ([place]) 10/12/2006  . Dyslipidemia associated  with type 2 diabetes mellitus ([place]) 10/12/2006  . Allergic rhinitis 10/12/2006  . GERD - Followed by Dr. Fuller Plan 10/12/2006  . IRRITABLE BOWEL SYNDROME - followed by Dr. Fuller Plan 10/12/2006  . Peripheral neuropathy 10/11/2006  . ALOPECIA NEC 10/11/2006   Past Medical History:  Diagnosis Date  . Alopecia   . Anemia, mild   . Arthritis   . Chronic cough    sees pulmonologist  . Chronic pain    chest wall and abd - s/p extensive eval  . Diabetes mellitus with neuropathy ([place])    sees endocrine  . Diverticulosis   . Fatty liver   . GERD (gastroesophageal reflux disease)    takes Nexium bid, hx erosive esophagitis  . Headache(784.0)    occasionally;r/t sinus   . History of colon polyps   . HTN (hypertension)   . Hx of  amputation of lesser toe ([place])    sees podiatrist  . Hyperlipemia   . IBS (irritable bowel syndrome)   . Insomnia    takes Elavil nightly  . Joint pain   . Neuropathy   . Osteomyelitis ([place])   . Pneumonia   . PONV (postoperative nausea and vomiting)   . Seasonal allergies    takes Zyrtec daily  . Sinus tachycardia     Family History  Problem Relation Age of Onset  . Lung cancer Mother 54       smoked heavily  . Emphysema Mother   . Hypertension Father   . Hyperlipidemia Father   . Diabetes Father   . Coronary artery disease Father   . Dementia Father   . COPD Father        smoked  . Stomach cancer Paternal Aunt   . Brain cancer Paternal Uncle   . Irritable bowel syndrome Other        Several family members on fathers side   . Diabetes Other   . Stomach cancer Maternal Aunt   . Lung cancer Paternal Uncle   . Heart disease Paternal Uncle   . Colon cancer Neg Hx     Past Surgical History:  Procedure Laterality Date  . AMPUTATION  12/28/2011   Procedure: AMPUTATION DIGIT;  Surgeon: Newt Minion, MD;  Location: [facility];  Service: Orthopedics;  Laterality: Right;  Right Foot 2nd Toe Amputation at MTP (metatarsophalangeal joint)  . AMPUTATION Right 04/25/2012   Procedure: Right Foot 3rd Toe Amputation;  Surgeon: Newt Minion, MD;  Location: [facility];  Service: Orthopedics;  Laterality: Right;  Right Foot Third Toe Amputation   . AMPUTATION Right 07/27/2012   Procedure: Right 4th Toe Amputation at Metatarsophalangeal;  Surgeon: Newt Minion, MD;  Location: [facility];  Service: Orthopedics;  Laterality: Right;  Right 4th Toe Amputation at Metatarsophalangeal  . AMPUTATION Left 07/15/2016   Procedure: Left 2nd Ray Amputation;  Surgeon: Newt Minion, MD;  Location: [facility];  Service: Orthopedics;  Laterality: Left;  . AMPUTATION Left 11/18/2016   Procedure: Left 3rd and 4th Ray Amputation;  Surgeon: Newt Minion, MD;  Location: [facility];  Service: Orthopedics;  Laterality: Left;  .  AMPUTATION Right 03/29/2017   Procedure: RIGHT FOOT 3RD AND 4TH RAY AMPUTATION;  Surgeon: Newt Minion, MD;  Location: [facility];  Service: Orthopedics;  Laterality: Right;  . COLONOSCOPY    . LAPAROSCOPIC APPENDECTOMY  01/05/2011   Procedure: APPENDECTOMY LAPAROSCOPIC;  Surgeon: Pedro Earls, MD;  Location[facility];  Service: General;  Laterality: N/A;  . OOPHORECTOMY  2001  . ROTATOR CUFF REPAIR Right    x 2  . TUBAL LIGATION    . VAGINAL HYSTERECTOMY  2001   Social History   Occupational History  . Occupation: Retired   Tobacco Use  . Smoking status: Never Smoker  . Smokeless tobacco: Never Used  Substance and Sexual Activity  . Alcohol use: No  . Drug use: No  . Sexual activity: Yes    Birth control/protection: Surgical

## 2019-01-27 ENCOUNTER — Encounter: Payer: Self-pay | Admitting: Orthopedic Surgery

## 2019-01-28 ENCOUNTER — Other Ambulatory Visit: Payer: Self-pay

## 2019-01-28 ENCOUNTER — Ambulatory Visit (INDEPENDENT_AMBULATORY_CARE_PROVIDER_SITE_OTHER): Payer: Medicare Other | Admitting: Physician Assistant

## 2019-01-28 ENCOUNTER — Encounter: Payer: Self-pay | Admitting: Physician Assistant

## 2019-01-28 VITALS — Ht 65.0 in | Wt 228.0 lb

## 2019-01-28 DIAGNOSIS — M86272 Subacute osteomyelitis, left ankle and foot: Secondary | ICD-10-CM

## 2019-01-28 DIAGNOSIS — M86271 Subacute osteomyelitis, right ankle and foot: Secondary | ICD-10-CM

## 2019-01-28 NOTE — Progress Notes (Signed)
Office Visit Note   [name]           Date of Birth: [birth year]           MRN: [id number] Visit Date: 01/28/2019              Requested by: Vivi Barrack, MD [address] [place],  [place] [address] PCP: Vivi Barrack, MD  Chief Complaint  Patient presents with  . Left Foot - Pain      HPI: This is a pleasant woman who presents today with a thickened callus with a blister above it.  She is status post callus trimming approximately 1 month ago.  She said she developed a dark spot and then a small area of fluid collection just above the callus this is gotten bigger. She is concerned that it has gotten infected  Assessment & Plan: Visit Diagnoses: No diagnosis found.  Plan: Patient was seen by Dr. Sharol Given. After obtaining verbal consent the callus and blister were debrided to healthy tissue. A bandaid with mupericon was applied Follow-Up Instructions: No follow-ups on file.   Ortho Exam  Patient is alert, oriented, no adenopathy, well-dressed, normal affect, normal respiratory effort. Left foot: Pulses intact. No erythema or cellulitis. Callus was debrided as well as the fluid collection. There was no purulent drainage . There was fibrous tissue consistent with a blood blister  Imaging: No results found. No images are attached to the encounter.  Labs: Lab Results  Component Value Date   HGBA1C 7.4 (A) 11/23/2018   HGBA1C 7.4 (A) 07/10/2018   HGBA1C 6.6 (A) 11/28/2017   ESRSEDRATE 36 (H) 02/24/2017   CRP 1.2 (H) 02/24/2017   LABURIC 5.5 09/01/2015   LABORGA GROUP B STREP (S.AGALACTIAE) ISOLATED 12/26/2014     Lab Results  Component Value Date   ALBUMIN 3.8 05/19/2017   ALBUMIN 3.6 04/21/2017   ALBUMIN 3.6 02/24/2017   PREALBUMIN 19.4 03/05/2012   LABURIC 5.5 09/01/2015    No results found for: MG No results found for: VD25OH  Lab Results  Component Value Date   PREALBUMIN 19.4 03/05/2012   CBC EXTENDED Latest Ref Rng & Units 11/22/2018  05/22/2018 01/17/2018  WBC 4.0 - 10.5 K/uL 10.8(H) 11.6(H) 11.7(H)  RBC 3.87 - 5.11 MIL/uL 4.64 4.54 4.63  HGB 12.0 - 15.0 g/dL 12.7 12.4 12.6  HCT 36.0 - 46.0 % 40.3 40.4 40.1  PLT 150 - 400 K/uL 380 361 372  NEUTROABS 1.7 - 7.7 K/uL 7.7 8.4(H) 8.2(H)  LYMPHSABS 0.7 - 4.0 K/uL 2.2 2.3 2.5     Body mass index is 37.94 kg/m.  Orders:  No orders of the defined types were placed in this encounter.  No orders of the defined types were placed in this encounter.    Procedures: No procedures performed  Clinical Data: No additional findings.  ROS:  All other systems negative, except as noted in the HPI. Review of Systems  Objective: Vital Signs: Ht 5\' 5"  (1.651 m)   Wt 228 lb (103.4 kg)   BMI 37.94 kg/m   Specialty Comments:  No specialty comments available.  PMFS History: Patient Active Problem List   Diagnosis Date Noted  . Hyperlipidemia associated with type 2 diabetes mellitus ([place]) 11/30/2017  . Impingement syndrome of right shoulder 07/06/2017  . Iron deficiency anemia 05/11/2017  . Morbid obesity ([place]) 04/24/2017  . Anemia 02/24/2017  . Baker's cyst 07/10/2016  . Onychomycosis 12/07/2015  . Upper airway cough syndrome 03/05/2014  . History  of amputation of lesser toe of right foot ([place]) 04/15/2013  . Hypertension associated with diabetes ([place]) 12/05/2006  . Uncontrolled type 2 diabetes mellitus with peripheral circulatory disorder ([place]) 10/12/2006  . Dyslipidemia associated with type 2 diabetes mellitus ([place]) 10/12/2006  . Allergic rhinitis 10/12/2006  . GERD - Followed by Dr. Fuller Plan 10/12/2006  . IRRITABLE BOWEL SYNDROME - followed by Dr. Fuller Plan 10/12/2006  . Peripheral neuropathy 10/11/2006  . ALOPECIA NEC 10/11/2006   Past Medical History:  Diagnosis Date  . Alopecia   . Anemia, mild   . Arthritis   . Chronic cough    sees pulmonologist  . Chronic pain    chest wall and abd - s/p extensive eval  . Diabetes mellitus with neuropathy ([place])    sees  endocrine  . Diverticulosis   . Fatty liver   . GERD (gastroesophageal reflux disease)    takes Nexium bid, hx erosive esophagitis  . Headache(784.0)    occasionally;r/t sinus   . History of colon polyps   . HTN (hypertension)   . Hx of amputation of lesser toe ([place])    sees podiatrist  . Hyperlipemia   . IBS (irritable bowel syndrome)   . Insomnia    takes Elavil nightly  . Joint pain   . Neuropathy   . Osteomyelitis ([place])   . Pneumonia   . PONV (postoperative nausea and vomiting)   . Seasonal allergies    takes Zyrtec daily  . Sinus tachycardia     Family History  Problem Relation Age of Onset  . Lung cancer Mother 22       smoked heavily  . Emphysema Mother   . Hypertension Father   . Hyperlipidemia Father   . Diabetes Father   . Coronary artery disease Father   . Dementia Father   . COPD Father        smoked  . Stomach cancer Paternal Aunt   . Brain cancer Paternal Uncle   . Irritable bowel syndrome Other        Several family members on fathers side   . Diabetes Other   . Stomach cancer Maternal Aunt   . Lung cancer Paternal Uncle   . Heart disease Paternal Uncle   . Colon cancer Neg Hx     Past Surgical History:  Procedure Laterality Date  . AMPUTATION  12/28/2011   Procedure: AMPUTATION DIGIT;  Surgeon: Newt Minion, MD;  Location: [facility];  Service: Orthopedics;  Laterality: Right;  Right Foot 2nd Toe Amputation at MTP (metatarsophalangeal joint)  . AMPUTATION Right 04/25/2012   Procedure: Right Foot 3rd Toe Amputation;  Surgeon: Newt Minion, MD;  Location: [facility];  Service: Orthopedics;  Laterality: Right;  Right Foot Third Toe Amputation   . AMPUTATION Right 07/27/2012   Procedure: Right 4th Toe Amputation at Metatarsophalangeal;  Surgeon: Newt Minion, MD;  Location: [facility];  Service: Orthopedics;  Laterality: Right;  Right 4th Toe Amputation at Metatarsophalangeal  . AMPUTATION Left 07/15/2016   Procedure: Left 2nd Ray Amputation;  Surgeon: Newt Minion, MD;  Location: [facility];  Service: Orthopedics;  Laterality: Left;  . AMPUTATION Left 11/18/2016   Procedure: Left 3rd and 4th Ray Amputation;  Surgeon: Newt Minion, MD;  Location: [facility];  Service: Orthopedics;  Laterality: Left;  . AMPUTATION Right 03/29/2017   Procedure: RIGHT FOOT 3RD AND 4TH RAY AMPUTATION;  Surgeon: Newt Minion, MD;  Location: [facility];  Service: Orthopedics;  Laterality: Right;  .  COLONOSCOPY    . LAPAROSCOPIC APPENDECTOMY  01/05/2011   Procedure: APPENDECTOMY LAPAROSCOPIC;  Surgeon: Pedro Earls, MD;  [facility];  Service: General;  Laterality: N/A;  . OOPHORECTOMY  2001  . ROTATOR CUFF REPAIR Right    x 2  . TUBAL LIGATION    . VAGINAL HYSTERECTOMY  2001   Social History   Occupational History  . Occupation: Retired   Tobacco Use  . Smoking status: Never Smoker  . Smokeless tobacco: Never Used  Substance and Sexual Activity  . Alcohol use: No  . Drug use: No  . Sexual activity: Yes    Birth control/protection: Surgical

## 2019-01-29 ENCOUNTER — Ambulatory Visit: Payer: Medicare Other | Admitting: Orthopedic Surgery

## 2019-02-04 ENCOUNTER — Ambulatory Visit (INDEPENDENT_AMBULATORY_CARE_PROVIDER_SITE_OTHER): Payer: Medicare Other | Admitting: Orthopedic Surgery

## 2019-02-04 ENCOUNTER — Encounter: Payer: Self-pay | Admitting: Orthopedic Surgery

## 2019-02-04 ENCOUNTER — Other Ambulatory Visit: Payer: Self-pay

## 2019-02-04 VITALS — Ht 65.0 in | Wt 228.0 lb

## 2019-02-04 DIAGNOSIS — B351 Tinea unguium: Secondary | ICD-10-CM | POA: Diagnosis not present

## 2019-02-04 DIAGNOSIS — L97521 Non-pressure chronic ulcer of other part of left foot limited to breakdown of skin: Secondary | ICD-10-CM | POA: Diagnosis not present

## 2019-02-04 NOTE — Progress Notes (Signed)
Office Visit Note   [name]           Date of Birth: December 20, 1945           MRN: [id number] Visit Date: 02/04/2019              Requested by: Vivi Barrack, MD [address] [place],  [place] [address] PCP: Vivi Barrack, MD  Chief Complaint  Patient presents with  . Right Foot - Follow-up  . 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  Patient has not been wearing her diabetic shoes since this is what is called as the ulcer.  Assessment & Plan: Visit Diagnoses: No diagnosis found.  Plan: Patient will continue Silvadene and a Band-Aid over the wound nails were trimmed x4.  Follow-Up Instructions: Return in about 3 months (around 05/05/2019).   Ortho Exam  Patient is alert, oriented, no adenopathy, well-dressed, normal affect, normal respiratory effort. Examination the wound is showing good interval healing with good healthy granulation tissue no cellulitis no odor no drainage no signs of infection ulcer is over the medial border of the Charcot collapse left foot.  Patient is thickened discolored onychomycotic nails x4 she is unable to safely trim the nails on her own and nails were trimmed x4 without complications.  Imaging: No results found. No images are attached to the encounter.  Labs: Lab Results  Component Value Date   HGBA1C 7.4 (A) 11/23/2018   HGBA1C 7.4 (A) 07/10/2018   HGBA1C 6.6 (A) 11/28/2017   ESRSEDRATE 36 (H) 02/24/2017   CRP 1.2 (H) 02/24/2017   LABURIC 5.5 09/01/2015   LABORGA GROUP B STREP (S.AGALACTIAE) ISOLATED 12/26/2014     Lab Results  Component Value Date   ALBUMIN 3.8 05/19/2017   ALBUMIN 3.6 04/21/2017   ALBUMIN 3.6 02/24/2017   PREALBUMIN 19.4 03/05/2012   LABURIC 5.5 09/01/2015    No results found for: MG No results found for: VD25OH  Lab Results  Component Value Date   PREALBUMIN 19.4  03/05/2012   CBC EXTENDED Latest Ref Rng & Units 11/22/2018 05/22/2018 01/17/2018  WBC 4.0 - 10.5 K/uL 10.8(H) 11.6(H) 11.7(H)  RBC 3.87 - 5.11 MIL/uL 4.64 4.54 4.63  HGB 12.0 - 15.0 g/dL 12.7 12.4 12.6  HCT 36.0 - 46.0 % 40.3 40.4 40.1  PLT 150 - 400 K/uL 380 361 372  NEUTROABS 1.7 - 7.7 K/uL 7.7 8.4(H) 8.2(H)  LYMPHSABS 0.7 - 4.0 K/uL 2.2 2.3 2.5     Body mass index is 37.94 kg/m.  Orders:  No orders of the defined types were placed in this encounter.  No orders of the defined types were placed in this encounter.    Procedures: No procedures performed  Clinical Data: No additional findings.  ROS:  All other systems negative, except as noted in the HPI. Review of Systems  Objective: Vital Signs: Ht 5\' 5"  (1.651 m)   Wt 228 lb (103.4 kg)   BMI 37.94 kg/m   Specialty Comments:  No specialty comments available.  PMFS History: Patient Active Problem List   Diagnosis Date Noted  . Hyperlipidemia associated with type 2 diabetes mellitus ([place]) 11/30/2017  . Impingement syndrome of right shoulder 07/06/2017  . Iron deficiency anemia 05/11/2017  . Morbid obesity ([place]) 04/24/2017  . Anemia 02/24/2017  . Baker's cyst 07/10/2016  . Onychomycosis 12/07/2015  .  Upper airway cough syndrome 03/05/2014  . History of amputation of lesser toe of right foot ([place]) 04/15/2013  . Hypertension associated with diabetes ([place]) 12/05/2006  . Uncontrolled type 2 diabetes mellitus with peripheral circulatory disorder ([place]) 10/12/2006  . Dyslipidemia associated with type 2 diabetes mellitus ([place]) 10/12/2006  . Allergic rhinitis 10/12/2006  . GERD - Followed by Dr. Fuller Plan 10/12/2006  . IRRITABLE BOWEL SYNDROME - followed by Dr. Fuller Plan 10/12/2006  . Peripheral neuropathy 10/11/2006  . ALOPECIA NEC 10/11/2006   Past Medical History:  Diagnosis Date  . Alopecia   . Anemia, mild   . Arthritis   . Chronic cough    sees pulmonologist  . Chronic pain    chest wall and abd - s/p extensive  eval  . Diabetes mellitus with neuropathy ([place])    sees endocrine  . Diverticulosis   . Fatty liver   . GERD (gastroesophageal reflux disease)    takes Nexium bid, hx erosive esophagitis  . Headache(784.0)    occasionally;r/t sinus   . History of colon polyps   . HTN (hypertension)   . Hx of amputation of lesser toe ([place])    sees podiatrist  . Hyperlipemia   . IBS (irritable bowel syndrome)   . Insomnia    takes Elavil nightly  . Joint pain   . Neuropathy   . Osteomyelitis ([place])   . Pneumonia   . PONV (postoperative nausea and vomiting)   . Seasonal allergies    takes Zyrtec daily  . Sinus tachycardia     Family History  Problem Relation Age of Onset  . Lung cancer Mother 66       smoked heavily  . Emphysema Mother   . Hypertension Father   . Hyperlipidemia Father   . Diabetes Father   . Coronary artery disease Father   . Dementia Father   . COPD Father        smoked  . Stomach cancer Paternal Aunt   . Brain cancer Paternal Uncle   . Irritable bowel syndrome Other        Several family members on fathers side   . Diabetes Other   . Stomach cancer Maternal Aunt   . Lung cancer Paternal Uncle   . Heart disease Paternal Uncle   . Colon cancer Neg Hx     Past Surgical History:  Procedure Laterality Date  . AMPUTATION  12/28/2011   Procedure: AMPUTATION DIGIT;  Surgeon: Newt Minion, MD;  Location: [facility];  Service: Orthopedics;  Laterality: Right;  Right Foot 2nd Toe Amputation at MTP (metatarsophalangeal joint)  . AMPUTATION Right 04/25/2012   Procedure: Right Foot 3rd Toe Amputation;  Surgeon: Newt Minion, MD;  Location: [facility];  Service: Orthopedics;  Laterality: Right;  Right Foot Third Toe Amputation   . AMPUTATION Right 07/27/2012   Procedure: Right 4th Toe Amputation at Metatarsophalangeal;  Surgeon: Newt Minion, MD;  Location: [facility];  Service: Orthopedics;  Laterality: Right;  Right 4th Toe Amputation at Metatarsophalangeal  . AMPUTATION Left 07/15/2016    Procedure: Left 2nd Ray Amputation;  Surgeon: Newt Minion, MD;  Location: [facility];  Service: Orthopedics;  Laterality: Left;  . AMPUTATION Left 11/18/2016   Procedure: Left 3rd and 4th Ray Amputation;  Surgeon: Newt Minion, MD;  Location: [facility];  Service: Orthopedics;  Laterality: Left;  . AMPUTATION Right 03/29/2017   Procedure: RIGHT FOOT 3RD AND 4TH RAY AMPUTATION;  Surgeon: Newt Minion, MD;  Location:  [facility];  Service: Orthopedics;  Laterality: Right;  . COLONOSCOPY    . LAPAROSCOPIC APPENDECTOMY  01/05/2011   Procedure: APPENDECTOMY LAPAROSCOPIC;  Surgeon: Pedro Earls, MD;  [facility];  Service: General;  Laterality: N/A;  . OOPHORECTOMY  2001  . ROTATOR CUFF REPAIR Right    x 2  . TUBAL LIGATION    . VAGINAL HYSTERECTOMY  2001   Social History   Occupational History  . Occupation: Retired   Tobacco Use  . Smoking status: Never Smoker  . Smokeless tobacco: Never Used  Substance and Sexual Activity  . Alcohol use: No  . Drug use: No  . Sexual activity: Yes    Birth control/protection: Surgical

## 2019-02-07 ENCOUNTER — Encounter: Payer: Self-pay | Admitting: Family Medicine

## 2019-02-07 ENCOUNTER — Other Ambulatory Visit: Payer: Self-pay

## 2019-02-07 MED ORDER — OMEPRAZOLE 40 MG PO CPDR: 40.0000 mg | DELAYED_RELEASE_CAPSULE | Freq: Two times a day (BID) | ORAL | 0 refills | Status: DC

## 2019-02-08 ENCOUNTER — Encounter: Payer: Self-pay | Admitting: Internal Medicine

## 2019-02-08 ENCOUNTER — Other Ambulatory Visit: Payer: Self-pay | Admitting: Internal Medicine

## 2019-02-08 ENCOUNTER — Ambulatory Visit: Payer: Medicare Other

## 2019-02-08 ENCOUNTER — Other Ambulatory Visit: Payer: Self-pay

## 2019-02-08 DIAGNOSIS — E1151 Type 2 diabetes mellitus with diabetic peripheral angiopathy without gangrene: Secondary | ICD-10-CM

## 2019-02-08 DIAGNOSIS — IMO0002 Reserved for concepts with insufficient information to code with codable children: Secondary | ICD-10-CM

## 2019-02-08 MED ORDER — ROSUVASTATIN CALCIUM 20 MG PO TABS: 20.0000 mg | ORAL_TABLET | Freq: Every day | ORAL | 2 refills | Status: DC

## 2019-02-08 MED ORDER — GABAPENTIN 100 MG PO CAPS: ORAL_CAPSULE | ORAL | 3 refills | Status: DC

## 2019-02-08 MED ORDER — IRBESARTAN 75 MG PO TABS: 75.0000 mg | ORAL_TABLET | Freq: Every day | ORAL | 2 refills | Status: DC

## 2019-02-08 MED ORDER — METFORMIN HCL 1000 MG PO TABS: ORAL_TABLET | ORAL | 2 refills | Status: DC

## 2019-02-13 ENCOUNTER — Ambulatory Visit: Payer: Medicare Other

## 2019-02-16 ENCOUNTER — Ambulatory Visit: Payer: Medicare Other

## 2019-02-22 ENCOUNTER — Other Ambulatory Visit: Payer: Self-pay

## 2019-02-22 ENCOUNTER — Inpatient Hospital Stay: Payer: Medicare Other | Attending: Nurse Practitioner

## 2019-02-22 DIAGNOSIS — D509 Iron deficiency anemia, unspecified: Secondary | ICD-10-CM | POA: Diagnosis not present

## 2019-02-22 LAB — CBC WITH DIFFERENTIAL (CANCER CENTER ONLY)
Abs Immature Granulocytes: 0.06 10*3/uL (ref 0.00–0.07)
Basophils Absolute: 0.1 10*3/uL (ref 0.0–0.1)
Basophils Relative: 1 %
Eosinophils Absolute: 0.2 10*3/uL (ref 0.0–0.5)
Eosinophils Relative: 2 %
HCT: 40 % (ref 36.0–46.0)
Hemoglobin: 12.6 g/dL (ref 12.0–15.0)
Immature Granulocytes: 1 %
Lymphocytes Relative: 26 %
Lymphs Abs: 2.9 10*3/uL (ref 0.7–4.0)
MCH: 26.9 pg (ref 26.0–34.0)
MCHC: 31.5 g/dL (ref 30.0–36.0)
MCV: 85.5 fL (ref 80.0–100.0)
Monocytes Absolute: 0.7 10*3/uL (ref 0.1–1.0)
Monocytes Relative: 6 %
Neutro Abs: 7.2 10*3/uL (ref 1.7–7.7)
Neutrophils Relative %: 64 %
Platelet Count: 327 10*3/uL (ref 150–400)
RBC: 4.68 MIL/uL (ref 3.87–5.11)
RDW: 13.5 % (ref 11.5–15.5)
WBC Count: 11.1 10*3/uL — ABNORMAL HIGH (ref 4.0–10.5)
nRBC: 0 % (ref 0.0–0.2)

## 2019-02-25 ENCOUNTER — Telehealth: Payer: Self-pay | Admitting: *Deleted

## 2019-02-25 NOTE — Telephone Encounter (Signed)
Notified of normal CBC results. F/U as scheduled.

## 2019-02-25 NOTE — Telephone Encounter (Signed)
-----   Message from Owens Shark, NP sent at 02/22/2019  4:23 PM EST ----- Please let her know hemoglobin is stable.  Follow-up as scheduled.

## 2019-03-13 NOTE — Progress Notes (Signed)
Cardiology Office Note   Date:  03/15/2019   ID:  Beth Holden, DOB [birth year], MRN [id number]  PCP:  Vivi Barrack, MD  Cardiologist:   Dorris Carnes, MD   Pt returns for f/u of HTN and HL   History of Present Illness: Beth Holden is a 74 y.o. female with a history of Diabetes GERD, HTN, HL   I saw her Dec 2017 for the first time for CP   Pain was substernal  Breathing helped   Not associated with activity   Myovue was done   This was normal     The patient also complained of some palpitations  Holter monitor showed no signif arrhythm     I recomm a trial of a low dose b blocke With cough I recomm she stop there ACE I  Since I saw her in clinic in 2019 she denies signif CP   No signif SOB except she occasionally wakes up from sleep SOB   Has to sit up     Has been told she snores  Followed in ortho for neuropathy   (Dr Sharol Given)  Activity limited by foot      Outpatient Medications Prior to Visit  Medication Sig Dispense Refill  . acetaminophen (TYLENOL) 500 MG tablet Take 500 mg by mouth every 8 (eight) hours as needed for mild pain.     Marland Kitchen betamethasone dipropionate (DIPROLENE) 0.05 % cream Apply 1 application topically 2 (two) times daily as needed (IRRITATION). 30 g 0  . Blood Glucose Monitoring Suppl (ONE TOUCH ULTRA 2) w/Device KIT Use to check blood sugar 1 each 0  . cetirizine (EQ ALLERGY RELIEF, CETIRIZINE,) 10 MG tablet Take 1 tablet (10 mg total) by mouth daily. 90 tablet 1  . cholecalciferol (VITAMIN D) 1000 units tablet Take 1 tablet by mouth daily.     Marland Kitchen gabapentin (NEURONTIN) 100 MG capsule TAKE 2-3 CAPSULES BY MOUTH AT BEDTIME 180 capsule 3  . glucagon (GLUCAGEN) 1 MG SOLR injection Inject 1 mg into the muscle once as needed for up to 1 dose for low blood sugar. 1 each 11  . glucose blood test strip Use to check blood sugar three times a day; E11.51 300 each 2  . insulin NPH Human (NOVOLIN N RELION) 100 UNIT/ML injection Inject under skin 15 units in am and 10  units at bedtime 20 mL 3  . insulin regular (NOVOLIN R RELION) 100 units/mL injection INJECT 12 TO 30 UNITS SUBCUTANEOUSLY THREE TIMES DAILY BEFORE MEAL(S) 80 mL 1  . Insulin Syringe-Needle U-100 (RELION INSULIN SYRINGE 1ML/31G) 31G X 5/16" 1 ML MISC USE 2 TIMES A DAY 100 each 2  . irbesartan (AVAPRO) 75 MG tablet Take 1 tablet (75 mg total) by mouth daily. 90 tablet 2  . metFORMIN (GLUCOPHAGE) 1000 MG tablet TAKE 1 TABLET TWICE A DAY  WITH MEALS 180 tablet 2  . metoprolol succinate (TOPROL-XL) 25 MG 24 hr tablet Take 1 tablet (25 mg total) by mouth daily. Needs appt for further refills, 1st attempt 90 tablet 2  . omeprazole (PRILOSEC) 40 MG capsule Take 1 capsule (40 mg total) by mouth 2 (two) times daily. 180 capsule 0  . OVER THE COUNTER MEDICATION Apply 1-3 application topically at bedtime. TOPRICIN FOOT CREAM - NEUROPATHY FOOT CREAM **APPLIES TO BOTH FEET AT BEDTIME**    . Probiotic Product (PROBIOTIC DAILY PO) Take 1 capsule by mouth daily.    . psyllium (METAMUCIL) 58.6 % packet Take 1 packet by mouth  daily as needed.     . rosuvastatin (CRESTOR) 20 MG tablet Take 1 tablet (20 mg total) by mouth daily. 90 tablet 2  . vitamin B-12 (CYANOCOBALAMIN) 1000 MCG tablet Take 1,000 mcg daily by mouth.     No facility-administered medications prior to visit.     Allergies:   Codeine and Propoxyphene hcl   Past Medical History:  Diagnosis Date  . Alopecia   . Anemia, mild   . Arthritis   . Chronic cough    sees pulmonologist  . Chronic pain    chest wall and abd - s/p extensive eval  . Diabetes mellitus with neuropathy ([place])    sees endocrine  . Diverticulosis   . Fatty liver   . GERD (gastroesophageal reflux disease)    takes Nexium bid, hx erosive esophagitis  . Headache(784.0)    occasionally;r/t sinus   . History of colon polyps   . HTN (hypertension)   . Hx of amputation of lesser toe ([place])    sees podiatrist  . Hyperlipemia   . IBS (irritable bowel syndrome)   . Insomnia     takes Elavil nightly  . Joint pain   . Neuropathy   . Osteomyelitis ([place])   . Pneumonia   . PONV (postoperative nausea and vomiting)   . Seasonal allergies    takes Zyrtec daily  . Sinus tachycardia     Past Surgical History:  Procedure Laterality Date  . AMPUTATION  12/28/2011   Procedure: AMPUTATION DIGIT;  Surgeon: Newt Minion, MD;  Location: [facility];  Service: Orthopedics;  Laterality: Right;  Right Foot 2nd Toe Amputation at MTP (metatarsophalangeal joint)  . AMPUTATION Right 04/25/2012   Procedure: Right Foot 3rd Toe Amputation;  Surgeon: Newt Minion, MD;  Location: [facility];  Service: Orthopedics;  Laterality: Right;  Right Foot Third Toe Amputation   . AMPUTATION Right 07/27/2012   Procedure: Right 4th Toe Amputation at Metatarsophalangeal;  Surgeon: Newt Minion, MD;  Location: [facility];  Service: Orthopedics;  Laterality: Right;  Right 4th Toe Amputation at Metatarsophalangeal  . AMPUTATION Left 07/15/2016   Procedure: Left 2nd Ray Amputation;  Surgeon: Newt Minion, MD;  Location: [facility];  Service: Orthopedics;  Laterality: Left;  . AMPUTATION Left 11/18/2016   Procedure: Left 3rd and 4th Ray Amputation;  Surgeon: Newt Minion, MD;  Location: [facility];  Service: Orthopedics;  Laterality: Left;  . AMPUTATION Right 03/29/2017   Procedure: RIGHT FOOT 3RD AND 4TH RAY AMPUTATION;  Surgeon: Newt Minion, MD;  Location: [facility];  Service: Orthopedics;  Laterality: Right;  . COLONOSCOPY    . LAPAROSCOPIC APPENDECTOMY  01/05/2011   Procedure: APPENDECTOMY LAPAROSCOPIC;  Surgeon: Pedro Earls, MD;  [facility];  Service: General;  Laterality: N/A;  . OOPHORECTOMY  2001  . ROTATOR CUFF REPAIR Right    x 2  . TUBAL LIGATION    . VAGINAL HYSTERECTOMY  2001     Social History:  The patient  reports that she has never smoked. She has never used smokeless tobacco. She reports that she does not drink alcohol or use drugs.   Family History:  The patient's family history includes  Brain cancer in her paternal uncle; COPD in her father; Coronary artery disease in her father; Dementia in her father; Diabetes in her father and another family member; Emphysema in her mother; Heart disease in her paternal uncle; Hyperlipidemia in her father; Hypertension in her father; Irritable bowel syndrome in  an other family member; Lung cancer in her paternal uncle; Lung cancer (age of onset: 23) in her mother; Stomach cancer in her maternal aunt and paternal aunt.    ROS:  Please see the history of present illness. All other systems are reviewed and  Negative to the above problem except as noted.    PHYSICAL EXAM: VS:  BP 126/74   Pulse 84   Ht 5' 5.5" (1.664 m)   Wt 231 lb 12.8 oz (105.1 kg)   SpO2 96%   BMI 37.99 kg/m   GEN:  Morbidly obese 74 yo  in no acute distress  HEENT: normal  Neck: no JVD, carotid bruits,  Cardiac: RRR; no murmurs, rubs, or gallops, No LE   edema  Respiratory:  clear to auscultation bilaterally, normal work of breathing GI: soft, nontender, nondistended, + BS  No hepatomegaly  MS: s/p amputation in toes bilatera    Skin: warm and dry  L instep mildy red   Neuro:  Strength and sensation are intact Psych: euthymic mood, full affect   EKG:  EKG is ordered today.  SR 91 bpm    Lipid Panel    Component Value Date/Time   CHOL 139 11/23/2018 0850   TRIG 111.0 11/23/2018 0850   HDL 43.60 11/23/2018 0850   CHOLHDL 3 11/23/2018 0850   VLDL 22.2 11/23/2018 0850   LDLCALC 73 11/23/2018 0850      Wt Readings from Last 3 Encounters:  03/15/19 231 lb 12.8 oz (105.1 kg)  02/04/19 228 lb (103.4 kg)  01/28/19 228 lb (103.4 kg)      ASSESSMENT AND PLAN:  1  CP   No symptoms to sggest angina   2  SOB   Breathing is stable   On exam, fluid appears aok    3  Palpitations. Pt denies  4  HTN  BP is controlled   5  DM  Long standing with complcations   Followed by C [facility]   A1C 7.4  6  HL  Last lpids very good   Will call in Rx for refill  F/U  in 1 year    Current medicines are reviewed at length with the patient today.  The patient does not have concerns regarding medicines.  Signed, Dorris Carnes, MD  03/15/2019 8:15 AM    [facility] [place], [place], [place]  [address] Phone: [phone]; Fax: [phone]

## 2019-03-15 ENCOUNTER — Ambulatory Visit (INDEPENDENT_AMBULATORY_CARE_PROVIDER_SITE_OTHER): Payer: Medicare Other | Admitting: Internal Medicine

## 2019-03-15 ENCOUNTER — Other Ambulatory Visit: Payer: Self-pay

## 2019-03-15 ENCOUNTER — Encounter: Payer: Self-pay | Admitting: Internal Medicine

## 2019-03-15 VITALS — BP 126/74 | HR 84 | Ht 65.5 in | Wt 231.8 lb

## 2019-03-15 DIAGNOSIS — R002 Palpitations: Secondary | ICD-10-CM | POA: Diagnosis not present

## 2019-03-15 MED ORDER — ROSUVASTATIN CALCIUM 20 MG PO TABS: 20.0000 mg | ORAL_TABLET | Freq: Every day | ORAL | 3 refills | Status: DC

## 2019-03-15 NOTE — Patient Instructions (Signed)
Medication Instructions:  *If you need a refill on your cardiac medications before your next appointment, please call your pharmacy*  Lab Work: If you have labs (blood work) drawn today and your tests are completely normal, you will receive your results only by: Marland Kitchen MyChart Message (if you have MyChart) OR . A paper copy in the mail If you have any lab test that is abnormal or we need to change your treatment, we will call you to review the results.  Follow-Up: At [facility], you and your health needs are our priority.  As part of our continuing mission to provide you with exceptional heart care, we have created designated Provider Care Teams.  These Care Teams include your primary Cardiologist (physician) and Advanced Practice Providers (APPs -  Physician Assistants and Nurse Practitioners) who all work together to provide you with the care you need, when you need it.  We recommend signing up for the patient portal called "MyChart".  Sign up information is provided on this After Visit Summary.  MyChart is used to connect with patients for Virtual Visits (Telemedicine).  Patients are able to view lab/test results, encounter notes, upcoming appointments, etc.  Non-urgent messages can be sent to your provider as well.   To learn more about what you can do with MyChart, go to [email or web address].    Your next appointment:   1 year(s)  The format for your next appointment:   In Person  Provider:   You may see Dorris Carnes, MD or one of the following Advanced Practice Providers on your designated Care Team:    Richardson Dopp, PA-C  [place], [place]  Daune Perch, [place]

## 2019-03-26 ENCOUNTER — Encounter: Payer: Self-pay | Admitting: Orthopedic Surgery

## 2019-03-26 ENCOUNTER — Ambulatory Visit (INDEPENDENT_AMBULATORY_CARE_PROVIDER_SITE_OTHER): Payer: Medicare Other | Admitting: Orthopedic Surgery

## 2019-03-26 ENCOUNTER — Other Ambulatory Visit: Payer: Self-pay

## 2019-03-26 DIAGNOSIS — M7501 Adhesive capsulitis of right shoulder: Secondary | ICD-10-CM

## 2019-03-26 DIAGNOSIS — M14672 Charcot's joint, left ankle and foot: Secondary | ICD-10-CM | POA: Diagnosis not present

## 2019-03-26 DIAGNOSIS — L97521 Non-pressure chronic ulcer of other part of left foot limited to breakdown of skin: Secondary | ICD-10-CM

## 2019-03-26 MED ORDER — LIDOCAINE HCL 1 % IJ SOLN
5.0000 mL | INTRAMUSCULAR | Status: AC | PRN
  Administered 2019-03-26: 5 mL

## 2019-03-26 MED ORDER — METHYLPREDNISOLONE ACETATE 40 MG/ML IJ SUSP
40.0000 mg | INTRAMUSCULAR | Status: AC | PRN
  Administered 2019-03-26: 40 mg via INTRA_ARTICULAR

## 2019-03-26 NOTE — Progress Notes (Signed)
Office Visit Note   [name]           Date of Birth: 11-11-1945           MRN: [id number] Visit Date: 03/26/2019              Requested by: Vivi Barrack, MD [address] [place],  [place] [address] PCP: Vivi Barrack, MD  Chief Complaint  Patient presents with  . Right Shoulder - Pain  . 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  #2 patient has had rotator cuff pathology and presents at this time with increasing pain and decreasing range of motion of her right shoulder.  Assessment & Plan: Visit Diagnoses:  1. Adhesive capsulitis of right shoulder   2. Charcot's joint, left ankle and foot   3. Ulcer of left foot, limited to breakdown of skin ([place])     Plan: Right shoulder was injected subacromial space patient will try working on range of motion to free up adhesions.  Ulcer was debrided will reevaluate in 4 weeks to see how she is doing with her shoulder and the arm foot.  Follow-Up Instructions: Return in about 4 weeks (around 04/23/2019).   Ortho Exam  Patient is alert, oriented, no adenopathy, well-dressed, normal affect, normal respiratory effort. Examination patient has decreased range of motion of the right shoulder she has internal rotation of 0 degrees external rotation of 30 degrees abduction and flexion of only 70 degrees.  Examination of the left foot she has a stable Charcot collapse she has good pulses there is no signs or symptoms of an active Charcot process.  Has Increase necrotic tissue around the Charcot Wagner grade 1 ulcer.  After informed consent a 10 blade knife was used to debride the skin and soft tissue back to healthy viable bleeding granulation tissue this was touched with silver nitrate the ulcer is 2 cm in diameter 5 mm deep there is no exposed bone or tendon.  Imaging: No  results found. No images are attached to the encounter.  Labs: Lab Results  Component Value Date   HGBA1C 7.4 (A) 11/23/2018   HGBA1C 7.4 (A) 07/10/2018   HGBA1C 6.6 (A) 11/28/2017   ESRSEDRATE 36 (H) 02/24/2017   CRP 1.2 (H) 02/24/2017   LABURIC 5.5 09/01/2015   LABORGA GROUP B STREP (S.AGALACTIAE) ISOLATED 12/26/2014     Lab Results  Component Value Date   ALBUMIN 3.8 05/19/2017   ALBUMIN 3.6 04/21/2017   ALBUMIN 3.6 02/24/2017   PREALBUMIN 19.4 03/05/2012   LABURIC 5.5 09/01/2015    No results found for: MG No results found for: VD25OH  Lab Results  Component Value Date   PREALBUMIN 19.4 03/05/2012   CBC EXTENDED Latest Ref Rng & Units 02/22/2019 11/22/2018 05/22/2018  WBC 4.0 - 10.5 K/uL 11.1(H) 10.8(H) 11.6(H)  RBC 3.87 - 5.11 MIL/uL 4.68 4.64 4.54  HGB 12.0 - 15.0 g/dL 12.6 12.7 12.4  HCT 36.0 - 46.0 % 40.0 40.3 40.4  PLT 150 - 400 K/uL 327 380 361  NEUTROABS 1.7 - 7.7 K/uL 7.2 7.7 8.4(H)  LYMPHSABS 0.7 - 4.0 K/uL 2.9 2.2 2.3     There is no height or weight on file to calculate BMI.  Orders:  No orders of the defined types were placed in this encounter.  No orders of the defined types were placed in this encounter.    Procedures: Large Joint Inj: R subacromial bursa on 03/26/2019 2:50 PM Indications: diagnostic evaluation and pain Details: 22 G 1.5 in needle, posterior approach  Arthrogram: No  Medications: 5 mL lidocaine 1 %; 40 mg methylPREDNISolone acetate 40 MG/ML Outcome: tolerated well, no immediate complications Procedure, treatment alternatives, risks and benefits explained, specific risks discussed. Consent was given by the patient. Immediately prior to procedure a time out was called to verify the correct patient, procedure, equipment, support staff and site/side marked as required. Patient was prepped and draped in the usual sterile fashion.      Clinical Data: No additional findings.  ROS:  All other systems negative, except as  noted in the HPI. Review of Systems  Objective: Vital Signs: There were no vitals taken for this visit.  Specialty Comments:  No specialty comments available.  PMFS History: Patient Active Problem List   Diagnosis Date Noted  . Hyperlipidemia associated with type 2 diabetes mellitus ([place]) 11/30/2017  . Impingement syndrome of right shoulder 07/06/2017  . Iron deficiency anemia 05/11/2017  . Morbid obesity ([place]) 04/24/2017  . Anemia 02/24/2017  . Baker's cyst 07/10/2016  . Onychomycosis 12/07/2015  . Upper airway cough syndrome 03/05/2014  . History of amputation of lesser toe of right foot ([place]) 04/15/2013  . Hypertension associated with diabetes ([place]) 12/05/2006  . Uncontrolled type 2 diabetes mellitus with peripheral circulatory disorder ([place]) 10/12/2006  . Dyslipidemia associated with type 2 diabetes mellitus ([place]) 10/12/2006  . Allergic rhinitis 10/12/2006  . GERD - Followed by Dr. Fuller Plan 10/12/2006  . IRRITABLE BOWEL SYNDROME - followed by Dr. Fuller Plan 10/12/2006  . Peripheral neuropathy 10/11/2006  . ALOPECIA NEC 10/11/2006   Past Medical History:  Diagnosis Date  . Alopecia   . Anemia, mild   . Arthritis   . Chronic cough    sees pulmonologist  . Chronic pain    chest wall and abd - s/p extensive eval  . Diabetes mellitus with neuropathy ([place])    sees endocrine  . Diverticulosis   . Fatty liver   . GERD (gastroesophageal reflux disease)    takes Nexium bid, hx erosive esophagitis  . Headache(784.0)    occasionally;r/t sinus   . History of colon polyps   . HTN (hypertension)   . Hx of amputation of lesser toe ([place])    sees podiatrist  . Hyperlipemia   . IBS (irritable bowel syndrome)   . Insomnia    takes Elavil nightly  . Joint pain   . Neuropathy   . Osteomyelitis ([place])   . Pneumonia   . PONV (postoperative nausea and vomiting)   . Seasonal allergies    takes Zyrtec daily  . Sinus tachycardia     Family History  Problem Relation Age of Onset  .  Lung cancer Mother 11       smoked heavily  . Emphysema Mother   . Hypertension Father   . Hyperlipidemia Father   . Diabetes Father   . Coronary artery disease Father   . Dementia Father   . COPD Father        smoked  . Stomach cancer Paternal Aunt   . Brain cancer Paternal Uncle   . Irritable bowel syndrome Other        Several family members on fathers side   . Diabetes Other   . Stomach cancer Maternal Aunt   . Lung cancer Paternal Uncle   .  Heart disease Paternal Uncle   . Colon cancer Neg Hx     Past Surgical History:  Procedure Laterality Date  . AMPUTATION  12/28/2011   Procedure: AMPUTATION DIGIT;  Surgeon: Newt Minion, MD;  Location: [facility];  Service: Orthopedics;  Laterality: Right;  Right Foot 2nd Toe Amputation at MTP (metatarsophalangeal joint)  . AMPUTATION Right 04/25/2012   Procedure: Right Foot 3rd Toe Amputation;  Surgeon: Newt Minion, MD;  Location: [facility];  Service: Orthopedics;  Laterality: Right;  Right Foot Third Toe Amputation   . AMPUTATION Right 07/27/2012   Procedure: Right 4th Toe Amputation at Metatarsophalangeal;  Surgeon: Newt Minion, MD;  Location: [facility];  Service: Orthopedics;  Laterality: Right;  Right 4th Toe Amputation at Metatarsophalangeal  . AMPUTATION Left 07/15/2016   Procedure: Left 2nd Ray Amputation;  Surgeon: Newt Minion, MD;  Location: [facility];  Service: Orthopedics;  Laterality: Left;  . AMPUTATION Left 11/18/2016   Procedure: Left 3rd and 4th Ray Amputation;  Surgeon: Newt Minion, MD;  Location: [facility];  Service: Orthopedics;  Laterality: Left;  . AMPUTATION Right 03/29/2017   Procedure: RIGHT FOOT 3RD AND 4TH RAY AMPUTATION;  Surgeon: Newt Minion, MD;  Location: [facility];  Service: Orthopedics;  Laterality: Right;  . COLONOSCOPY    . LAPAROSCOPIC APPENDECTOMY  01/05/2011   Procedure: APPENDECTOMY LAPAROSCOPIC;  Surgeon: Pedro Earls, MD;  Location[facility];  Service: General;  Laterality: N/A;  . OOPHORECTOMY  2001  . ROTATOR  CUFF REPAIR Right    x 2  . TUBAL LIGATION    . VAGINAL HYSTERECTOMY  2001   Social History   Occupational History  . Occupation: Retired   Tobacco Use  . Smoking status: Never Smoker  . Smokeless tobacco: Never Used  Substance and Sexual Activity  . Alcohol use: No  . Drug use: No  . Sexual activity: Yes    Birth control/protection: Surgical

## 2019-03-27 ENCOUNTER — Ambulatory Visit: Payer: Medicare Other | Admitting: Internal Medicine

## 2019-04-02 ENCOUNTER — Ambulatory Visit: Payer: Medicare Other | Admitting: Orthopedic Surgery

## 2019-04-15 ENCOUNTER — Other Ambulatory Visit: Payer: Self-pay | Admitting: Family Medicine

## 2019-04-27 ENCOUNTER — Other Ambulatory Visit: Payer: Self-pay | Admitting: Gastroenterology

## 2019-05-07 ENCOUNTER — Other Ambulatory Visit: Payer: Self-pay

## 2019-05-07 ENCOUNTER — Encounter: Payer: Self-pay | Admitting: Orthopedic Surgery

## 2019-05-07 ENCOUNTER — Ambulatory Visit (INDEPENDENT_AMBULATORY_CARE_PROVIDER_SITE_OTHER): Payer: Medicare Other | Admitting: Orthopedic Surgery

## 2019-05-07 VITALS — Ht 65.0 in | Wt 231.0 lb

## 2019-05-07 DIAGNOSIS — B351 Tinea unguium: Secondary | ICD-10-CM | POA: Diagnosis not present

## 2019-05-07 DIAGNOSIS — M7501 Adhesive capsulitis of right shoulder: Secondary | ICD-10-CM | POA: Diagnosis not present

## 2019-05-07 DIAGNOSIS — L97521 Non-pressure chronic ulcer of other part of left foot limited to breakdown of skin: Secondary | ICD-10-CM | POA: Diagnosis not present

## 2019-05-07 DIAGNOSIS — M14672 Charcot's joint, left ankle and foot: Secondary | ICD-10-CM

## 2019-05-07 MED ORDER — SILVER SULFADIAZINE 1 % EX CREA: 1.0000 "application " | TOPICAL_CREAM | Freq: Every day | CUTANEOUS | 1 refills | Status: DC

## 2019-05-08 ENCOUNTER — Encounter: Payer: Self-pay | Admitting: Orthopedic Surgery

## 2019-05-08 NOTE — Progress Notes (Signed)
Office Visit Note   [name]           Date of Birth: December 23, 1945           MRN: [id number] Visit Date: 05/07/2019              Requested by: Vivi Barrack, MD [address] [place],  [place] [address] PCP: Vivi Barrack, MD  Chief Complaint  Patient presents with  . Right Shoulder - Follow-up    S/p right shoulder injection 03/26/19  . Left Foot - Follow-up    Charcot ulcer      HPI: Patient is a 74 year old woman who presents with adhesive capsulitis right shoulder injected 2 weeks ago.  Patient also follows up for a ulceration plantar aspect left foot with Charcot collapse.  She states that the injection of the right shoulder will help well has no shoulder symptoms at this time.  She states she has been having increasing neuropathy pain in the left foot with worsening ulcer.  Patient also complains of onychomycotic nails on both feet that she is unable to safely trim on her own.  Patient states she does have extra-depth shoes and custom orthotics she is not wearing these she feels that these may have made her foot worse.  Assessment & Plan: Visit Diagnoses:  1. Adhesive capsulitis of right shoulder   2. Onychomycosis   3. Charcot's joint, left ankle and foot   4. Ulcer of left foot, limited to breakdown of skin ([place])     Plan: Patient will bring her shoes and orthotics with her so we can modify it necessary.  Patient was given felt relieving donuts to unload pressure from the ulcer prescription for Silvadene.  Follow-Up Instructions: Return in about 3 weeks (around 05/28/2019).   Ortho Exam  Patient is alert, oriented, no adenopathy, well-dressed, normal affect, normal respiratory effort. Examination  Patient has no pain with active or passive range of motion the right shoulder.  Examination of the left foot she has a Wagner grade 1 ulcer over the Charcot rocker-bottom deformity that is 1 cm in diameter prior to debridement.  After informed consent a 10  blade knife was used to debride the skin and soft tissue back to bleeding viable granulation tissue this was touched with silver nitrate there was no exposed bone or tendon the wound was 3 cm in diameter and 3 mm deep after debridement.  Patient does have thickened discolored onychomycotic nails x4 and the nails were trimmed x4 without complication.  Imaging: No results found. No images are attached to the encounter.  Labs: Lab Results  Component Value Date   HGBA1C 7.4 (A) 11/23/2018   HGBA1C 7.4 (A) 07/10/2018   HGBA1C 6.6 (A) 11/28/2017   ESRSEDRATE 36 (H) 02/24/2017   CRP 1.2 (H) 02/24/2017   LABURIC 5.5 09/01/2015   LABORGA GROUP B STREP (S.AGALACTIAE) ISOLATED 12/26/2014     Lab Results  Component Value Date   ALBUMIN 3.8 05/19/2017   ALBUMIN 3.6 04/21/2017   ALBUMIN 3.6 02/24/2017   PREALBUMIN 19.4 03/05/2012   LABURIC 5.5 09/01/2015    No results found for: MG No results found for: VD25OH  Lab Results  Component Value Date   PREALBUMIN 19.4 03/05/2012   CBC EXTENDED Latest Ref Rng & Units 02/22/2019 11/22/2018 05/22/2018  WBC 4.0 - 10.5 K/uL 11.1(H) 10.8(H) 11.6(H)  RBC 3.87 - 5.11 MIL/uL 4.68 4.64 4.54  HGB 12.0 - 15.0 g/dL 12.6 12.7 12.4  HCT 36.0 -  46.0 % 40.0 40.3 40.4  PLT 150 - 400 K/uL 327 380 361  NEUTROABS 1.7 - 7.7 K/uL 7.2 7.7 8.4(H)  LYMPHSABS 0.7 - 4.0 K/uL 2.9 2.2 2.3     Body mass index is 38.44 kg/m.  Orders:  No orders of the defined types were placed in this encounter.  Meds ordered this encounter  Medications  . silver sulfADIAZINE (SILVADENE) 1 % cream    Sig: Apply 1 application topically daily.    Dispense:  50 g    Refill:  1     Procedures: No procedures performed  Clinical Data: No additional findings.  ROS:  All other systems negative, except as noted in the HPI. Review of Systems  Objective: Vital Signs: Ht 5\' 5"  (1.651 m)   Wt 231 lb (104.8 kg)   BMI 38.44 kg/m   Specialty Comments:  No specialty comments  available.  PMFS History: Patient Active Problem List   Diagnosis Date Noted  . Hyperlipidemia associated with type 2 diabetes mellitus ([place]) 11/30/2017  . Impingement syndrome of right shoulder 07/06/2017  . Iron deficiency anemia 05/11/2017  . Morbid obesity ([place]) 04/24/2017  . Anemia 02/24/2017  . Baker's cyst 07/10/2016  . Onychomycosis 12/07/2015  . Upper airway cough syndrome 03/05/2014  . History of amputation of lesser toe of right foot ([place]) 04/15/2013  . Hypertension associated with diabetes ([place]) 12/05/2006  . Uncontrolled type 2 diabetes mellitus with peripheral circulatory disorder ([place]) 10/12/2006  . Dyslipidemia associated with type 2 diabetes mellitus ([place]) 10/12/2006  . Allergic rhinitis 10/12/2006  . GERD - Followed by Dr. Fuller Plan 10/12/2006  . IRRITABLE BOWEL SYNDROME - followed by Dr. Fuller Plan 10/12/2006  . Peripheral neuropathy 10/11/2006  . ALOPECIA NEC 10/11/2006   Past Medical History:  Diagnosis Date  . Alopecia   . Anemia, mild   . Arthritis   . Chronic cough    sees pulmonologist  . Chronic pain    chest wall and abd - s/p extensive eval  . Diabetes mellitus with neuropathy ([place])    sees endocrine  . Diverticulosis   . Fatty liver   . GERD (gastroesophageal reflux disease)    takes Nexium bid, hx erosive esophagitis  . Headache(784.0)    occasionally;r/t sinus   . History of colon polyps   . HTN (hypertension)   . Hx of amputation of lesser toe ([place])    sees podiatrist  . Hyperlipemia   . IBS (irritable bowel syndrome)   . Insomnia    takes Elavil nightly  . Joint pain   . Neuropathy   . Osteomyelitis ([place])   . Pneumonia   . PONV (postoperative nausea and vomiting)   . Seasonal allergies    takes Zyrtec daily  . Sinus tachycardia     Family History  Problem Relation Age of Onset  . Lung cancer Mother 36       smoked heavily  . Emphysema Mother   . Hypertension Father   . Hyperlipidemia Father   . Diabetes Father   . Coronary  artery disease Father   . Dementia Father   . COPD Father        smoked  . Stomach cancer Paternal Aunt   . Brain cancer Paternal Uncle   . Irritable bowel syndrome Other        Several family members on fathers side   . Diabetes Other   . Stomach cancer Maternal Aunt   . Lung cancer Paternal Uncle   . Heart  disease Paternal Uncle   . Colon cancer Neg Hx     Past Surgical History:  Procedure Laterality Date  . AMPUTATION  12/28/2011   Procedure: AMPUTATION DIGIT;  Surgeon: Newt Minion, MD;  Location: [facility];  Service: Orthopedics;  Laterality: Right;  Right Foot 2nd Toe Amputation at MTP (metatarsophalangeal joint)  . AMPUTATION Right 04/25/2012   Procedure: Right Foot 3rd Toe Amputation;  Surgeon: Newt Minion, MD;  Location: [facility];  Service: Orthopedics;  Laterality: Right;  Right Foot Third Toe Amputation   . AMPUTATION Right 07/27/2012   Procedure: Right 4th Toe Amputation at Metatarsophalangeal;  Surgeon: Newt Minion, MD;  Location: [facility];  Service: Orthopedics;  Laterality: Right;  Right 4th Toe Amputation at Metatarsophalangeal  . AMPUTATION Left 07/15/2016   Procedure: Left 2nd Ray Amputation;  Surgeon: Newt Minion, MD;  Location: [facility];  Service: Orthopedics;  Laterality: Left;  . AMPUTATION Left 11/18/2016   Procedure: Left 3rd and 4th Ray Amputation;  Surgeon: Newt Minion, MD;  Location: [facility];  Service: Orthopedics;  Laterality: Left;  . AMPUTATION Right 03/29/2017   Procedure: RIGHT FOOT 3RD AND 4TH RAY AMPUTATION;  Surgeon: Newt Minion, MD;  Location: [facility];  Service: Orthopedics;  Laterality: Right;  . COLONOSCOPY    . LAPAROSCOPIC APPENDECTOMY  01/05/2011   Procedure: APPENDECTOMY LAPAROSCOPIC;  Surgeon: Pedro Earls, MD;  [facility];  Service: General;  Laterality: N/A;  . OOPHORECTOMY  2001  . ROTATOR CUFF REPAIR Right    x 2  . TUBAL LIGATION    . VAGINAL HYSTERECTOMY  2001   Social History   Occupational History  . Occupation: Retired     Tobacco Use  . Smoking status: Never Smoker  . Smokeless tobacco: Never Used  Substance and Sexual Activity  . Alcohol use: No  . Drug use: No  . Sexual activity: Yes    Birth control/protection: Surgical

## 2019-05-17 ENCOUNTER — Other Ambulatory Visit: Payer: Self-pay | Admitting: Internal Medicine

## 2019-05-17 DIAGNOSIS — IMO0002 Reserved for concepts with insufficient information to code with codable children: Secondary | ICD-10-CM

## 2019-05-17 DIAGNOSIS — E1151 Type 2 diabetes mellitus with diabetic peripheral angiopathy without gangrene: Secondary | ICD-10-CM

## 2019-05-22 ENCOUNTER — Telehealth: Payer: Self-pay | Admitting: Oncology

## 2019-05-22 ENCOUNTER — Telehealth: Payer: Self-pay | Admitting: *Deleted

## 2019-05-22 NOTE — Telephone Encounter (Signed)
Reports sore throat, ear ache and headache, no fever. Should she reschedule her appointments tomorrow. Confirmed we will cancel tomorrow and reschedule for early June. Scheduling message sent.

## 2019-05-22 NOTE — Telephone Encounter (Signed)
Scheduled appt per 5/12 sch message pt is aware of appt date and time   

## 2019-05-23 ENCOUNTER — Inpatient Hospital Stay: Payer: Medicare Other

## 2019-05-23 ENCOUNTER — Telehealth (INDEPENDENT_AMBULATORY_CARE_PROVIDER_SITE_OTHER): Payer: Medicare Other | Admitting: Family Medicine

## 2019-05-23 ENCOUNTER — Inpatient Hospital Stay: Payer: Medicare Other | Admitting: Oncology

## 2019-05-23 DIAGNOSIS — J309 Allergic rhinitis, unspecified: Secondary | ICD-10-CM | POA: Diagnosis not present

## 2019-05-23 MED ORDER — FLUOCINOLONE ACETONIDE 0.01 % OT OIL: TOPICAL_OIL | OTIC | 0 refills | Status: DC

## 2019-05-23 MED ORDER — AZITHROMYCIN 250 MG PO TABS: ORAL_TABLET | ORAL | 0 refills | Status: DC

## 2019-05-23 NOTE — Assessment & Plan Note (Addendum)
Worsened and likely contributing to above.  We will refill her fluocinolone for her itchy ear canals as she has been on this for several years.  Continue Zyrtec.

## 2019-05-23 NOTE — Progress Notes (Signed)
   Beth Holden is a 74 y.o. female who presents today for a virtual office visit.  Assessment/Plan:  New/Acute Problems: Sinusitis Discussed limitations of virtual visit and inability to perform physical exam.  Given prolonged course of symptoms and recent worsening, will start azithromycin.  She has tried Astelin the past however did not tolerate due to headaches-will avoid for now.  Encouraged good oral hydration.  Chronic Problems Addressed Today: Allergic rhinitis Worsened and likely contributing to above.  We will refill her fluocinolone for her itchy ear canals as she has been on this for several years.  Continue Zyrtec.     Subjective:  HPI:  Symptoms started several weeks ago, but worsened 4-5 days. Symptoms include sore throat, cough, congestion, and facial pressure.  No fevers or chills.  No specific treatments tried.  Granddaughter has been sick with similar symptoms.  She has had some increased ear pain and itching as well.  She request refill on her eardrops that she been using in the past.  Allergies been worsening.       Objective/Observations  Physical Exam: Gen: NAD, resting comfortably Pulm: Normal work of breathing Neuro: Grossly normal, moves all extremities Psych: Normal affect and thought content  Virtual Visit via Video   I connected with Beth Holden on 05/23/19 at 11:20 AM EDT by a video enabled telemedicine application and verified that I am speaking with the correct person using two identifiers. The limitations of evaluation and management by telemedicine and the availability of in person appointments were discussed. The patient expressed understanding and agreed to proceed.   Patient location: Home Provider location: [place] participating in the virtual visit: Myself and Patient     Algis Greenhouse. Jerline Pain, MD 05/23/2019 10:05 AM

## 2019-05-29 ENCOUNTER — Other Ambulatory Visit: Payer: Self-pay

## 2019-05-29 ENCOUNTER — Ambulatory Visit (INDEPENDENT_AMBULATORY_CARE_PROVIDER_SITE_OTHER): Payer: Medicare Other | Admitting: Physician Assistant

## 2019-05-29 ENCOUNTER — Encounter: Payer: Self-pay | Admitting: Physician Assistant

## 2019-05-29 VITALS — BP 112/78 | HR 75 | Temp 97.5°F | Ht 65.0 in | Wt 231.8 lb

## 2019-05-29 DIAGNOSIS — R399 Unspecified symptoms and signs involving the genitourinary system: Secondary | ICD-10-CM | POA: Diagnosis not present

## 2019-05-29 DIAGNOSIS — R300 Dysuria: Secondary | ICD-10-CM | POA: Diagnosis not present

## 2019-05-29 LAB — POCT URINALYSIS DIPSTICK
Bilirubin, UA: POSITIVE
Blood, UA: NEGATIVE
Glucose, UA: NEGATIVE
Ketones, UA: POSITIVE
Leukocytes, UA: NEGATIVE
Nitrite, UA: NEGATIVE
Protein, UA: POSITIVE — AB
Spec Grav, UA: >=1.03 — AB (ref 1.010–1.025)
Urobilinogen, UA: 1 E.U./dL
pH, UA: 5.5 (ref 5.0–8.0)

## 2019-05-29 MED ORDER — FLUCONAZOLE 150 MG PO TABS: 150.0000 mg | ORAL_TABLET | Freq: Once | ORAL | 0 refills | Status: AC

## 2019-05-29 MED ORDER — NYSTATIN 100000 UNIT/GM EX CREA: TOPICAL_CREAM | CUTANEOUS | 0 refills | Status: DC

## 2019-05-29 MED ORDER — METOPROLOL SUCCINATE ER 25 MG PO TB24: 25.0000 mg | ORAL_TABLET | Freq: Every day | ORAL | 2 refills | Status: DC

## 2019-05-29 NOTE — Patient Instructions (Signed)
It was great to see you!  Start oral diflucan one time tablet.  Use nystatin ointment in the area as needed.  I will be in touch when your culture returns.  [place]!  Take care,  Inda Coke PA-C

## 2019-05-29 NOTE — Progress Notes (Signed)
Beth Holden is a 74 y.o. female here for a new problem.  I acted as a Education administrator for [employer], PA-C [employer], [place]  History of Present Illness:   Chief Complaint  Patient presents with  . Urinary Tract Infection    HPI   Yeast Infection Patient finished her Z pack this past Monday which was prescribed for a sinus infection. She usually takes Diflucan along with her antibiotics, but did not this time. She also used a soap that caused irritation yesterday.  States that her outer vagina has a burning/uncomfortable sensation.  Denies: burning with urination, itching, vaginal discharge  Does report that she typically experiences elevated blood sugars, but they are now coming down now that she has discontinued the antibiotic.  Past Medical History:  Diagnosis Date  . Alopecia   . Anemia, mild   . Arthritis   . Chronic cough    sees pulmonologist  . Chronic pain    chest wall and abd - s/p extensive eval  . Diabetes mellitus with neuropathy ([place])    sees endocrine  . Diverticulosis   . Fatty liver   . GERD (gastroesophageal reflux disease)    takes Nexium bid, hx erosive esophagitis  . Headache(784.0)    occasionally;r/t sinus   . History of colon polyps   . HTN (hypertension)   . Hx of amputation of lesser toe ([place])    sees podiatrist  . Hyperlipemia   . IBS (irritable bowel syndrome)   . Insomnia    takes Elavil nightly  . Joint pain   . Neuropathy   . Osteomyelitis ([place])   . Pneumonia   . PONV (postoperative nausea and vomiting)   . Seasonal allergies    takes Zyrtec daily  . Sinus tachycardia      Social History   Socioeconomic History  . Marital status: Married    Spouse name: Not on file  . Number of children: 2  . Years of education: Not on file  . Highest education level: Not on file  Occupational History  . Occupation: Retired   Tobacco Use  . Smoking status: Never Smoker  . Smokeless tobacco: Never Used  Substance and Sexual Activity   . Alcohol use: No  . Drug use: No  . Sexual activity: Yes    Birth control/protection: Surgical  Other Topics Concern  . Not on file  Social History Narrative   Caffeine daily    [employer], [employer] no diploma   Married '66   1 dtr- '78; 1 son '71; 2 grandchildren   Occupation: retired 04   Dad with alzheimers-had to place in [place] (summer '10)         Social Determinants of Radio broadcast assistant Strain:   . Difficulty of Paying Living Expenses:   Food Insecurity:   . Worried About Charity fundraiser in the Last Year:   . Arboriculturist in the Last Year:   Transportation Needs:   . Film/video editor (Medical):   Marland Kitchen Lack of Transportation (Non-Medical):   Physical Activity:   . Days of Exercise per Week:   . Minutes of Exercise per Session:   Stress:   . Feeling of Stress :   Social Connections:   . Frequency of Communication with Friends and Family:   . Frequency of Social Gatherings with Friends and Family:   . Attends Religious Services:   . Active Member of Clubs or Organizations:   . Attends Club  or Organization Meetings:   Marland Kitchen Marital Status:   Intimate Partner Violence:   . Fear of Current or Ex-Partner:   . Emotionally Abused:   Marland Kitchen Physically Abused:   . Sexually Abused:     Past Surgical History:  Procedure Laterality Date  . AMPUTATION  12/28/2011   Procedure: AMPUTATION DIGIT;  Surgeon: Newt Minion, MD;  Location: [place];  Service: Orthopedics;  Laterality: Right;  Right Foot 2nd Toe Amputation at MTP (metatarsophalangeal joint)  . AMPUTATION Right 04/25/2012   Procedure: Right Foot 3rd Toe Amputation;  Surgeon: Newt Minion, MD;  Location: [facility];  Service: Orthopedics;  Laterality: Right;  Right Foot Third Toe Amputation   . AMPUTATION Right 07/27/2012   Procedure: Right 4th Toe Amputation at Metatarsophalangeal;  Surgeon: Newt Minion, MD;  Location: [facility];  Service: Orthopedics;  Laterality: Right;  Right 4th Toe Amputation at Metatarsophalangeal  .  AMPUTATION Left 07/15/2016   Procedure: Left 2nd Ray Amputation;  Surgeon: Newt Minion, MD;  Location: [facility];  Service: Orthopedics;  Laterality: Left;  . AMPUTATION Left 11/18/2016   Procedure: Left 3rd and 4th Ray Amputation;  Surgeon: Newt Minion, MD;  Location: [place];  Service: Orthopedics;  Laterality: Left;  . AMPUTATION Right 03/29/2017   Procedure: RIGHT FOOT 3RD AND 4TH RAY AMPUTATION;  Surgeon: Newt Minion, MD;  Location: [facility];  Service: Orthopedics;  Laterality: Right;  . COLONOSCOPY    . LAPAROSCOPIC APPENDECTOMY  01/05/2011   Procedure: APPENDECTOMY LAPAROSCOPIC;  Surgeon: Pedro Earls, MD;  [facility];  Service: General;  Laterality: N/A;  . OOPHORECTOMY  2001  . ROTATOR CUFF REPAIR Right    x 2  . TUBAL LIGATION    . VAGINAL HYSTERECTOMY  2001    Family History  Problem Relation Age of Onset  . Lung cancer Mother 66       smoked heavily  . Emphysema Mother   . Hypertension Father   . Hyperlipidemia Father   . Diabetes Father   . Coronary artery disease Father   . Dementia Father   . COPD Father        smoked  . Stomach cancer Paternal Aunt   . Brain cancer Paternal Uncle   . Irritable bowel syndrome Other        Several family members on fathers side   . Diabetes Other   . Stomach cancer Maternal Aunt   . Lung cancer Paternal Uncle   . Heart disease Paternal Uncle   . Colon cancer Neg Hx     Allergies  Allergen Reactions  . Codeine Other (See Comments)    Makes her crazy  . Propoxyphene Hcl Itching    *DARVOCET     Current Medications:   Current Outpatient Medications:  .  acetaminophen (TYLENOL) 500 MG tablet, Take 500 mg by mouth every 8 (eight) hours as needed for mild pain. , Disp: , Rfl:  .  betamethasone dipropionate (DIPROLENE) 0.05 % cream, Apply 1 application topically 2 (two) times daily as needed (IRRITATION)., Disp: 30 g, Rfl: 0 .  Blood Glucose Monitoring Suppl (ONE TOUCH ULTRA 2) w/Device KIT, Use to check blood sugar,  Disp: 1 each, Rfl: 0 .  cetirizine (ZYRTEC) 10 MG tablet, Take 1 tablet by mouth once daily, Disp: 90 tablet, Rfl: 0 .  cholecalciferol (VITAMIN D) 1000 units tablet, Take 1 tablet by mouth daily. , Disp: , Rfl:  .  Fluocinolone Acetonide 0.01 % OIL,  Use 1-2 drops daily as needed., Disp: 20 mL, Rfl: 0 .  gabapentin (NEURONTIN) 100 MG capsule, TAKE 2-3 CAPSULES BY MOUTH AT BEDTIME, Disp: 180 capsule, Rfl: 3 .  glucagon (GLUCAGEN) 1 MG SOLR injection, Inject 1 mg into the muscle once as needed for up to 1 dose for low blood sugar., Disp: 1 each, Rfl: 11 .  insulin NPH Human (NOVOLIN N RELION) 100 UNIT/ML injection, Inject under skin 15 units in am and 10 units at bedtime, Disp: 20 mL, Rfl: 3 .  insulin regular (NOVOLIN R RELION) 100 units/mL injection, INJECT 12 TO 30 UNITS SUBCUTANEOUSLY THREE TIMES DAILY BEFORE MEAL(S), Disp: 80 mL, Rfl: 1 .  Insulin Syringe-Needle U-100 (RELION INSULIN SYRINGE 1ML/31G) 31G X 5/16" 1 ML MISC, USE 2 TIMES A DAY, Disp: 100 each, Rfl: 2 .  irbesartan (AVAPRO) 75 MG tablet, Take 1 tablet (75 mg total) by mouth daily., Disp: 90 tablet, Rfl: 2 .  metFORMIN (GLUCOPHAGE) 1000 MG tablet, TAKE 1 TABLET TWICE A DAY  WITH MEALS, Disp: 180 tablet, Rfl: 2 .  metoprolol succinate (TOPROL-XL) 25 MG 24 hr tablet, Take 1 tablet (25 mg total) by mouth daily., Disp: 90 tablet, Rfl: 2 .  omeprazole (PRILOSEC) 40 MG capsule, TAKE 1 CAPSULE TWICE A DAY, Disp: 180 capsule, Rfl: 0 .  ONETOUCH ULTRA test strip, USE 1 STRIP TO CHECK GLUCOSE THREE TIMES DAILY, Disp: 300 each, Rfl: 11 .  OVER THE COUNTER MEDICATION, Apply 1-3 application topically at bedtime. TOPRICIN FOOT CREAM - NEUROPATHY FOOT CREAM **APPLIES TO BOTH FEET AT BEDTIME**, Disp: , Rfl:  .  Probiotic Product (PROBIOTIC DAILY PO), Take 1 capsule by mouth daily., Disp: , Rfl:  .  rosuvastatin (CRESTOR) 20 MG tablet, Take 1 tablet (20 mg total) by mouth daily., Disp: 90 tablet, Rfl: 3 .  silver sulfADIAZINE (SILVADENE) 1 % cream,  Apply 1 application topically daily., Disp: 50 g, Rfl: 1 .  vitamin B-12 (CYANOCOBALAMIN) 1000 MCG tablet, Take 1,000 mcg daily by mouth., Disp: , Rfl:  .  fluconazole (DIFLUCAN) 150 MG tablet, Take 1 tablet (150 mg total) by mouth once for 1 dose., Disp: 1 tablet, Rfl: 0 .  nystatin cream (MYCOSTATIN), Apply to affected area 1-2 times daily, Disp: 30 g, Rfl: 0   Review of Systems:   ROS  Negative unless otherwise specified per HPI.  Vitals:   Vitals:   05/29/19 1312  BP: 112/78  Pulse: 75  Temp: (!) 97.5 F (36.4 C)  TempSrc: Temporal  SpO2: 92%  Weight: 231 lb 12.8 oz (105.1 kg)  Height: _0  (1.651 m)     Body mass index is 38.57 kg/m.  Physical Exam:   Physical Exam Vitals and nursing note reviewed.  Constitutional:      General: She is not in acute distress.    Appearance: She is well-developed. She is not ill-appearing or toxic-appearing.  Cardiovascular:     Rate and Rhythm: Normal rate and regular rhythm.     Pulses: Normal pulses.     Heart sounds: Normal heart sounds, S1 normal and S2 normal.     Comments: No LE edema Pulmonary:     Effort: Pulmonary effort is normal.     Breath sounds: Normal breath sounds.  Skin:    General: Skin is warm and dry.  Neurological:     Mental Status: She is alert.     GCS: GCS eye subscore is 4. GCS verbal subscore is 5. GCS motor subscore is 6.  Psychiatric:        Speech: Speech normal.        Behavior: Behavior normal. Behavior is cooperative.     Results for orders placed or performed in visit on 05/29/19  POCT Urinalysis Dipstick  Result Value Ref Range   Color, UA yellow    Clarity, UA cloudy    Glucose, UA Negative Negative   Bilirubin, UA Positive    Ketones, UA Positive    Spec Grav, UA >=1.030 (A) 1.010 - 1.025   Blood, UA Negative    pH, UA 5.5 5.0 - 8.0   Protein, UA Positive (A) Negative   Urobilinogen, UA 1.0 0.2 or 1.0 E.U./dL   Nitrite, UA Negative    Leukocytes, UA Negative Negative    Appearance     Odor      Assessment and Plan:   Aziyah was seen today for urinary tract infection.  Diagnoses and all orders for this visit:  Dysuria UA shows dehydration, no obvious infection. Will start oral diflucan and topical nystatin ointment. Urine culture pending. Worsening precautions advised. -     POCT Urinalysis Dipstick -     Urine Culture  Other orders -     fluconazole (DIFLUCAN) 150 MG tablet; Take 1 tablet (150 mg total) by mouth once for 1 dose. -     nystatin cream (MYCOSTATIN); Apply to affected area 1-2 times daily  . Reviewed expectations re: course of current medical issues. . Discussed self-management of symptoms. . Outlined signs and symptoms indicating need for more acute intervention. . Patient verbalized understanding and all questions were answered. . See orders for this visit as documented in the electronic medical record. . Patient received an After-Visit Summary.  CMA or LPN served as scribe during this visit. History, Physical, and Plan performed by medical provider. The above documentation has been reviewed and is accurate and complete.  Inda Coke, PA-C

## 2019-05-30 ENCOUNTER — Ambulatory Visit: Payer: Medicare Other | Admitting: Orthopedic Surgery

## 2019-05-30 LAB — URINE CULTURE
MICRO NUMBER:: 10496203
SPECIMEN QUALITY:: ADEQUATE

## 2019-06-06 ENCOUNTER — Other Ambulatory Visit: Payer: Self-pay

## 2019-06-06 ENCOUNTER — Encounter: Payer: Self-pay | Admitting: Orthopedic Surgery

## 2019-06-06 ENCOUNTER — Ambulatory Visit (INDEPENDENT_AMBULATORY_CARE_PROVIDER_SITE_OTHER): Payer: Medicare Other | Admitting: Orthopedic Surgery

## 2019-06-06 VITALS — Ht 65.0 in | Wt 231.0 lb

## 2019-06-06 DIAGNOSIS — M14672 Charcot's joint, left ankle and foot: Secondary | ICD-10-CM | POA: Diagnosis not present

## 2019-06-06 DIAGNOSIS — L97521 Non-pressure chronic ulcer of other part of left foot limited to breakdown of skin: Secondary | ICD-10-CM | POA: Diagnosis not present

## 2019-06-07 ENCOUNTER — Encounter: Payer: Self-pay | Admitting: Orthopedic Surgery

## 2019-06-07 NOTE — Progress Notes (Signed)
Office Visit Note   [name]           Date of Birth: 1945-09-30           MRN: [id number] Visit Date: 06/06/2019              Requested by: Vivi Barrack, MD [address] [place],  [place] [address] PCP: Vivi Barrack, MD  Chief Complaint  Patient presents with  . Right Foot - Follow-up  . 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  Patient states she has been minimizing her weightbearing she states she has had a small amount of bloody drainage.  Assessment & Plan: Visit Diagnoses:  1. Charcot's joint, left ankle and foot   2. Ulcer of left foot, limited to breakdown of skin ([place])     Plan: Ulcer was debrided she tolerated this well continue with her extra-depth shoes custom orthotics  Follow-Up Instructions: Return in about 3 weeks (around 06/27/2019).   Ortho Exam  Patient is alert, oriented, no adenopathy, well-dressed, normal affect, normal respiratory effort. Examination patient has a good pulse she has a stable Charcot arthropathy of the left foot with no active process no redness no cellulitis no signs of active Charcot arthropathy no signs of infection.  She has recurrent Wagner grade 1 ulcer on the plantar aspect the left foot that is 1 cm in diameter prior to debridement.  After informed consent a 10 blade knife was used to debride the skin and soft tissue back to healthy viable granulation tissue it was 25 mm in diameter 3 mm deep after debridement silver nitrate was used hemostasis a Band-Aid was applied.  Imaging: No results found. No images are attached to the encounter.  Labs: Lab Results  Component Value Date   HGBA1C 7.4 (A) 11/23/2018   HGBA1C 7.4 (A) 07/10/2018   HGBA1C 6.6 (A) 11/28/2017   ESRSEDRATE 36 (H) 02/24/2017   CRP 1.2 (H) 02/24/2017   LABURIC 5.5 09/01/2015   LABORGA GROUP B STREP  (S.AGALACTIAE) ISOLATED 12/26/2014     Lab Results  Component Value Date   ALBUMIN 3.8 05/19/2017   ALBUMIN 3.6 04/21/2017   ALBUMIN 3.6 02/24/2017   PREALBUMIN 19.4 03/05/2012   LABURIC 5.5 09/01/2015    No results found for: MG No results found for: VD25OH  Lab Results  Component Value Date   PREALBUMIN 19.4 03/05/2012   CBC EXTENDED Latest Ref Rng & Units 02/22/2019 11/22/2018 05/22/2018  WBC 4.0 - 10.5 K/uL 11.1(H) 10.8(H) 11.6(H)  RBC 3.87 - 5.11 MIL/uL 4.68 4.64 4.54  HGB 12.0 - 15.0 g/dL 12.6 12.7 12.4  HCT 36.0 - 46.0 % 40.0 40.3 40.4  PLT 150 - 400 K/uL 327 380 361  NEUTROABS 1.7 - 7.7 K/uL 7.2 7.7 8.4(H)  LYMPHSABS 0.7 - 4.0 K/uL 2.9 2.2 2.3     Body mass index is 38.44 kg/m.  Orders:  No orders of the defined types were placed in this encounter.  No orders of the defined types were placed in this encounter.    Procedures: No procedures performed  Clinical Data: No additional findings.  ROS:  All other systems negative, except as noted in the HPI. Review of Systems  Objective: Vital Signs: Ht 5\' 5"  (1.651 m)   Wt 231 lb (104.8 kg)   BMI 38.44 kg/m   Specialty Comments:  No specialty comments available.  PMFS History: Patient Active Problem List   Diagnosis Date Noted  . Hyperlipidemia associated with type 2 diabetes mellitus ([place]) 11/30/2017  . Impingement syndrome of right shoulder 07/06/2017  . Iron deficiency anemia 05/11/2017  . Morbid obesity ([place]) 04/24/2017  . Anemia 02/24/2017  . Baker's cyst 07/10/2016  . Onychomycosis 12/07/2015  . Upper airway cough syndrome 03/05/2014  . History of amputation of lesser toe of right foot ([place]) 04/15/2013  . Hypertension associated with diabetes ([place]) 12/05/2006  . Uncontrolled type 2 diabetes mellitus with peripheral circulatory disorder ([place]) 10/12/2006  . Dyslipidemia associated with type 2 diabetes mellitus ([place]) 10/12/2006  . Allergic rhinitis 10/12/2006  . GERD - Followed by Dr. Fuller Plan  10/12/2006  . IRRITABLE BOWEL SYNDROME - followed by Dr. Fuller Plan 10/12/2006  . Peripheral neuropathy 10/11/2006  . ALOPECIA NEC 10/11/2006   Past Medical History:  Diagnosis Date  . Alopecia   . Anemia, mild   . Arthritis   . Chronic cough    sees pulmonologist  . Chronic pain    chest wall and abd - s/p extensive eval  . Diabetes mellitus with neuropathy ([place])    sees endocrine  . Diverticulosis   . Fatty liver   . GERD (gastroesophageal reflux disease)    takes Nexium bid, hx erosive esophagitis  . Headache(784.0)    occasionally;r/t sinus   . History of colon polyps   . HTN (hypertension)   . Hx of amputation of lesser toe ([place])    sees podiatrist  . Hyperlipemia   . IBS (irritable bowel syndrome)   . Insomnia    takes Elavil nightly  . Joint pain   . Neuropathy   . Osteomyelitis ([place])   . Pneumonia   . PONV (postoperative nausea and vomiting)   . Seasonal allergies    takes Zyrtec daily  . Sinus tachycardia     Family History  Problem Relation Age of Onset  . Lung cancer Mother 72       smoked heavily  . Emphysema Mother   . Hypertension Father   . Hyperlipidemia Father   . Diabetes Father   . Coronary artery disease Father   . Dementia Father   . COPD Father        smoked  . Stomach cancer Paternal Aunt   . Brain cancer Paternal Uncle   . Irritable bowel syndrome Other        Several family members on fathers side   . Diabetes Other   . Stomach cancer Maternal Aunt   . Lung cancer Paternal Uncle   . Heart disease Paternal Uncle   . Colon cancer Neg Hx     Past Surgical History:  Procedure Laterality Date  . AMPUTATION  12/28/2011   Procedure: AMPUTATION DIGIT;  Surgeon: Newt Minion, MD;  Location: [facility];  Service: Orthopedics;  Laterality: Right;  Right Foot 2nd Toe Amputation at MTP (metatarsophalangeal joint)  . AMPUTATION Right 04/25/2012   Procedure: Right Foot 3rd Toe Amputation;  Surgeon: Newt Minion, MD;  Location: [facility];  Service:  Orthopedics;  Laterality: Right;  Right Foot Third Toe Amputation   . AMPUTATION Right 07/27/2012   Procedure: Right 4th Toe Amputation at Metatarsophalangeal;  Surgeon: Newt Minion, MD;  Location: [facility];  Service: Orthopedics;  Laterality: Right;  Right 4th Toe Amputation at Metatarsophalangeal  . AMPUTATION Left 07/15/2016   Procedure: Left 2nd Ray Amputation;  Surgeon: Newt Minion, MD;  Location: [facility];  Service: Orthopedics;  Laterality: Left;  . AMPUTATION Left 11/18/2016   Procedure: Left 3rd and 4th Ray Amputation;  Surgeon: Newt Minion, MD;  Location: [facility];  Service: Orthopedics;  Laterality: Left;  . AMPUTATION Right 03/29/2017   Procedure: RIGHT FOOT 3RD AND 4TH RAY AMPUTATION;  Surgeon: Newt Minion, MD;  Location: [facility];  Service: Orthopedics;  Laterality: Right;  . COLONOSCOPY    . LAPAROSCOPIC APPENDECTOMY  01/05/2011   Procedure: APPENDECTOMY LAPAROSCOPIC;  Surgeon: Pedro Earls, MD;  [facility];  Service: General;  Laterality: N/A;  . OOPHORECTOMY  2001  . ROTATOR CUFF REPAIR Right    x 2  . TUBAL LIGATION    . VAGINAL HYSTERECTOMY  2001   Social History   Occupational History  . Occupation: Retired   Tobacco Use  . Smoking status: Never Smoker  . Smokeless tobacco: Never Used  Substance and Sexual Activity  . Alcohol use: No  . Drug use: No  . Sexual activity: Yes    Birth control/protection: Surgical

## 2019-06-27 ENCOUNTER — Encounter: Payer: Self-pay | Admitting: Orthopedic Surgery

## 2019-06-27 ENCOUNTER — Other Ambulatory Visit: Payer: Self-pay

## 2019-06-27 ENCOUNTER — Ambulatory Visit (INDEPENDENT_AMBULATORY_CARE_PROVIDER_SITE_OTHER): Payer: Medicare Other | Admitting: Orthopedic Surgery

## 2019-06-27 VITALS — Ht 65.0 in | Wt 231.0 lb

## 2019-06-27 DIAGNOSIS — L97521 Non-pressure chronic ulcer of other part of left foot limited to breakdown of skin: Secondary | ICD-10-CM

## 2019-06-27 DIAGNOSIS — M14672 Charcot's joint, left ankle and foot: Secondary | ICD-10-CM

## 2019-06-27 DIAGNOSIS — B351 Tinea unguium: Secondary | ICD-10-CM | POA: Diagnosis not present

## 2019-06-27 NOTE — Progress Notes (Signed)
Office Visit Note   [name]           Date of Birth: 06/11/1945           MRN: [id number] Visit Date: 06/27/2019              Requested by: Vivi Barrack, MD [address] [place],  [place] [address] PCP: Vivi Barrack, MD  Chief Complaint  Patient presents with  . Right Foot - Follow-up  . Left Foot - Follow-up      HPI: Patient is a 74 year old woman who presents in follow-up for Charcot collapse left foot with a Wagner grade 1 ulcer beneath the rocker-bottom deformity.  She also complains of onychomycotic nails x4.  Assessment & Plan: Visit Diagnoses:  1. Charcot's joint, left ankle and foot   2. Ulcer of left foot, limited to breakdown of skin ([place])   3. Onychomycosis     Plan: Nails were trimmed x4 without complication ulcer was debrided back to healthy viable tissue  Follow-Up Instructions: Return in about 4 weeks (around 07/25/2019).   Ortho Exam  Patient is alert, oriented, no adenopathy, well-dressed, normal affect, normal respiratory effort. Examination of both feet the Wagner grade 1 ulcer beneath the Charcot rocker-bottom deformity of the left foot is improving.  The predebridement wound is 1 cm in diameter.  After informed consent a 10 blade knife was used to debride the skin and soft tissue back to healthy viable granulation tissue the ulcer is 3 cm in diameter the center core area is 5 mm in diameter and the ulcer is 1 mm deep there is no cellulitis no drainage no odor no signs of infection.  Patient has onychomycotic nails x4 with 2 onychomycotic nails on both feet.  The nails were trimmed without complications.  Patient's calf measures 38 cm in circumference and recommended a size large compression stocking for the venous insufficiency.  Imaging: No results found. No images are attached to the encounter.  Labs: Lab Results  Component Value Date   HGBA1C 7.4 (A) 11/23/2018   HGBA1C 7.4 (A) 07/10/2018   HGBA1C 6.6 (A) 11/28/2017    ESRSEDRATE 36 (H) 02/24/2017   CRP 1.2 (H) 02/24/2017   LABURIC 5.5 09/01/2015   LABORGA GROUP B STREP (S.AGALACTIAE) ISOLATED 12/26/2014     Lab Results  Component Value Date   ALBUMIN 3.8 05/19/2017   ALBUMIN 3.6 04/21/2017   ALBUMIN 3.6 02/24/2017   PREALBUMIN 19.4 03/05/2012   LABURIC 5.5 09/01/2015    No results found for: MG No results found for: VD25OH  Lab Results  Component Value Date   PREALBUMIN 19.4 03/05/2012   CBC EXTENDED Latest Ref Rng & Units 02/22/2019 11/22/2018 05/22/2018  WBC 4.0 - 10.5 K/uL 11.1(H) 10.8(H) 11.6(H)  RBC 3.87 - 5.11 MIL/uL 4.68 4.64 4.54  HGB 12.0 - 15.0 g/dL 12.6 12.7 12.4  HCT 36 - 46 % 40.0 40.3 40.4  PLT 150 - 400 K/uL 327 380 361  NEUTROABS 1.7 - 7.7 K/uL 7.2 7.7 8.4(H)  LYMPHSABS 0.7 - 4.0 K/uL 2.9 2.2 2.3     Body mass index is 38.44 kg/m.  Orders:  No orders of the defined types were placed in this encounter.  No orders of the defined types were placed in this encounter.    Procedures: No procedures performed  Clinical Data: No additional findings.  ROS:  All other systems negative, except as noted in the HPI. Review of Systems  Objective: Vital Signs: Ht 5'  5" (1.651 m)   Wt 231 lb (104.8 kg)   BMI 38.44 kg/m   Specialty Comments:  No specialty comments available.  PMFS History: Patient Active Problem List   Diagnosis Date Noted  . Hyperlipidemia associated with type 2 diabetes mellitus ([place]) 11/30/2017  . Impingement syndrome of right shoulder 07/06/2017  . Iron deficiency anemia 05/11/2017  . Morbid obesity ([place]) 04/24/2017  . Anemia 02/24/2017  . Baker's cyst 07/10/2016  . Onychomycosis 12/07/2015  . Upper airway cough syndrome 03/05/2014  . History of amputation of lesser toe of right foot ([place]) 04/15/2013  . Hypertension associated with diabetes ([place]) 12/05/2006  . Uncontrolled type 2 diabetes mellitus with peripheral circulatory disorder ([place]) 10/12/2006  . Dyslipidemia associated with type  2 diabetes mellitus ([place]) 10/12/2006  . Allergic rhinitis 10/12/2006  . GERD - Followed by Dr. Fuller Plan 10/12/2006  . IRRITABLE BOWEL SYNDROME - followed by Dr. Fuller Plan 10/12/2006  . Peripheral neuropathy 10/11/2006  . ALOPECIA NEC 10/11/2006   Past Medical History:  Diagnosis Date  . Alopecia   . Anemia, mild   . Arthritis   . Chronic cough    sees pulmonologist  . Chronic pain    chest wall and abd - s/p extensive eval  . Diabetes mellitus with neuropathy ([place])    sees endocrine  . Diverticulosis   . Fatty liver   . GERD (gastroesophageal reflux disease)    takes Nexium bid, hx erosive esophagitis  . Headache(784.0)    occasionally;r/t sinus   . History of colon polyps   . HTN (hypertension)   . Hx of amputation of lesser toe ([place])    sees podiatrist  . Hyperlipemia   . IBS (irritable bowel syndrome)   . Insomnia    takes Elavil nightly  . Joint pain   . Neuropathy   . Osteomyelitis ([place])   . Pneumonia   . PONV (postoperative nausea and vomiting)   . Seasonal allergies    takes Zyrtec daily  . Sinus tachycardia     Family History  Problem Relation Age of Onset  . Lung cancer Mother 42       smoked heavily  . Emphysema Mother   . Hypertension Father   . Hyperlipidemia Father   . Diabetes Father   . Coronary artery disease Father   . Dementia Father   . COPD Father        smoked  . Stomach cancer Paternal Aunt   . Brain cancer Paternal Uncle   . Irritable bowel syndrome Other        Several family members on fathers side   . Diabetes Other   . Stomach cancer Maternal Aunt   . Lung cancer Paternal Uncle   . Heart disease Paternal Uncle   . Colon cancer Neg Hx     Past Surgical History:  Procedure Laterality Date  . AMPUTATION  12/28/2011   Procedure: AMPUTATION DIGIT;  Surgeon: Newt Minion, MD;  Location: [facility];  Service: Orthopedics;  Laterality: Right;  Right Foot 2nd Toe Amputation at MTP (metatarsophalangeal joint)  . AMPUTATION Right 04/25/2012    Procedure: Right Foot 3rd Toe Amputation;  Surgeon: Newt Minion, MD;  Location: [facility];  Service: Orthopedics;  Laterality: Right;  Right Foot Third Toe Amputation   . AMPUTATION Right 07/27/2012   Procedure: Right 4th Toe Amputation at Metatarsophalangeal;  Surgeon: Newt Minion, MD;  Location: [facility];  Service: Orthopedics;  Laterality: Right;  Right 4th Toe Amputation at  Metatarsophalangeal  . AMPUTATION Left 07/15/2016   Procedure: Left 2nd Ray Amputation;  Surgeon: Newt Minion, MD;  Location: [facility];  Service: Orthopedics;  Laterality: Left;  . AMPUTATION Left 11/18/2016   Procedure: Left 3rd and 4th Ray Amputation;  Surgeon: Newt Minion, MD;  Location: [facility];  Service: Orthopedics;  Laterality: Left;  . AMPUTATION Right 03/29/2017   Procedure: RIGHT FOOT 3RD AND 4TH RAY AMPUTATION;  Surgeon: Newt Minion, MD;  Location: [facility];  Service: Orthopedics;  Laterality: Right;  . COLONOSCOPY    . LAPAROSCOPIC APPENDECTOMY  01/05/2011   Procedure: APPENDECTOMY LAPAROSCOPIC;  Surgeon: Pedro Earls, MD;  [facility];  Service: General;  Laterality: N/A;  . OOPHORECTOMY  2001  . ROTATOR CUFF REPAIR Right    x 2  . TUBAL LIGATION    . VAGINAL HYSTERECTOMY  2001   Social History   Occupational History  . Occupation: Retired   Tobacco Use  . Smoking status: Never Smoker  . Smokeless tobacco: Never Used  Vaping Use  . Vaping Use: Never used  Substance and Sexual Activity  . Alcohol use: No  . Drug use: No  . Sexual activity: Yes    Birth control/protection: Surgical

## 2019-07-04 ENCOUNTER — Inpatient Hospital Stay: Payer: Medicare Other

## 2019-07-04 ENCOUNTER — Other Ambulatory Visit: Payer: Self-pay

## 2019-07-04 ENCOUNTER — Inpatient Hospital Stay: Payer: Medicare Other | Attending: Oncology | Admitting: Oncology

## 2019-07-04 VITALS — BP 153/81 | HR 71 | Temp 98.1°F | Resp 18 | Ht 65.0 in | Wt 234.1 lb

## 2019-07-04 DIAGNOSIS — E119 Type 2 diabetes mellitus without complications: Secondary | ICD-10-CM | POA: Diagnosis not present

## 2019-07-04 DIAGNOSIS — K5900 Constipation, unspecified: Secondary | ICD-10-CM | POA: Insufficient documentation

## 2019-07-04 DIAGNOSIS — I10 Essential (primary) hypertension: Secondary | ICD-10-CM | POA: Diagnosis not present

## 2019-07-04 DIAGNOSIS — D509 Iron deficiency anemia, unspecified: Secondary | ICD-10-CM | POA: Diagnosis present

## 2019-07-04 DIAGNOSIS — Z86010 Personal history of colonic polyps: Secondary | ICD-10-CM | POA: Insufficient documentation

## 2019-07-04 LAB — CBC WITH DIFFERENTIAL (CANCER CENTER ONLY)
Abs Immature Granulocytes: 0.03 10*3/uL (ref 0.00–0.07)
Basophils Absolute: 0.1 10*3/uL (ref 0.0–0.1)
Basophils Relative: 1 %
Eosinophils Absolute: 0.2 10*3/uL (ref 0.0–0.5)
Eosinophils Relative: 2 %
HCT: 37.6 % (ref 36.0–46.0)
Hemoglobin: 11.8 g/dL — ABNORMAL LOW (ref 12.0–15.0)
Immature Granulocytes: 0 %
Lymphocytes Relative: 19 %
Lymphs Abs: 2 10*3/uL (ref 0.7–4.0)
MCH: 27.6 pg (ref 26.0–34.0)
MCHC: 31.4 g/dL (ref 30.0–36.0)
MCV: 88.1 fL (ref 80.0–100.0)
Monocytes Absolute: 0.6 10*3/uL (ref 0.1–1.0)
Monocytes Relative: 6 %
Neutro Abs: 7.6 10*3/uL (ref 1.7–7.7)
Neutrophils Relative %: 72 %
Platelet Count: 375 10*3/uL (ref 150–400)
RBC: 4.27 MIL/uL (ref 3.87–5.11)
RDW: 13.3 % (ref 11.5–15.5)
WBC Count: 10.5 10*3/uL (ref 4.0–10.5)
nRBC: 0 % (ref 0.0–0.2)

## 2019-07-04 LAB — FERRITIN: Ferritin: 60 ng/mL (ref 11–307)

## 2019-07-04 NOTE — Progress Notes (Signed)
  [facility] OFFICE PROGRESS NOTE   Diagnosis: Iron deficiency anemia  INTERVAL HISTORY:   Beth Holden returns as scheduled.  She generally feels well.  She has intermittent constipation.  No bleeding.  Objective:  Vital signs in last 24 hours:  Blood pressure (!) 153/81, pulse 71, temperature 98.1 F (36.7 C), temperature source Temporal, resp. rate 18, height 5\' 5"  (1.651 m), weight 234 lb 1.6 oz (106.2 kg), SpO2 99 %.    HEENT: No angular cheilitis, the tongue appears normal GI: No hepatosplenomegaly, nontender, no mass Vascular: Trace pitting edema at the lower leg and ankle bilaterally   Lab Results:  Lab Results  Component Value Date   WBC 10.5 07/04/2019   HGB 11.8 (L) 07/04/2019   HCT 37.6 07/04/2019   MCV 88.1 07/04/2019   PLT 375 07/04/2019   NEUTROABS 7.6 07/04/2019    CMP  Lab Results  Component Value Date   NA 140 11/23/2018   K 4.5 11/23/2018   CL 101 11/23/2018   CO2 29 11/23/2018   GLUCOSE 125 (H) 11/23/2018   BUN 18 11/23/2018   CREATININE 0.87 11/23/2018   CALCIUM 9.6 11/23/2018   PROT 6.8 11/23/2018   ALBUMIN 3.8 05/19/2017   AST 17 11/23/2018   ALT 9 11/23/2018   ALKPHOS 65 05/19/2017   BILITOT 0.4 11/23/2018   GFRNONAA 67 11/23/2018   GFRAA 78 11/23/2018    Medications: I have reviewed the patient's current medications.   Assessment/Plan: 1. Anemia secondary to iron deficiency, possible GI blood loss related to gastritis status postIVferrous gluconate weekly x4 beginning 03/13/2017, single dose of IV ferrous gluconate 05/22/2017. Hemoglobin/MCV corrected into normal range. 2. Upper endoscopy 02/15/2017-diffuse moderate inflammation characterized by congestion, erythema, friability and granularity in the gastric body, posterior wall of the stomach and gastric antrum. Biopsy GASTRIC ANTRAL MUCOSA WITH NON-SPECIFIC REACTIVE GASTROPATHY AND FEW SUBEPITHELIAL CRYSTALLINEIRON DEPOSITS, CONSISTENT WITH IRON PILL GASTROPATHY.  GASTRIC OXYNTIC MUCOSA WITH PARIETAL CELL HYPERPLASIA AS CAN BE SEEN IN HYPERGASTRINEMIC STATESSUCH AS PPI THERAPY.WARTHIN-STARRY STAIN IS NEGATIVE FOR HELICOBACTER PYLORI 3. Colonoscopy 02/15/2017-6 mm polyp in the transverse colon (TUBULAR ADENOMA. NEGATIVE FOR HIGH GRADE DYSPLASIA OR MALIGNANCY) 4. Diabetes 5. Hypertension 6. 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 Small hemoglobin on repeat urinalyses-seen by urology.     Disposition: Beth Holden appears well.  The hemoglobin is slightly lower today compared to when she was here in February.  We will follow up on the ferritin level from today.  She last received IV iron in May 2019.  She will call for symptoms of anemia.  She will return for a lab visit in 4 months and an office visit in 8 months.  She will continue colonoscopy follow-up with Dr. Fuller Plan.  Betsy Coder, MD  07/04/2019  8:45 AM

## 2019-07-05 ENCOUNTER — Encounter: Payer: Self-pay | Admitting: Family Medicine

## 2019-07-05 ENCOUNTER — Telehealth: Payer: Self-pay | Admitting: *Deleted

## 2019-07-05 NOTE — Telephone Encounter (Signed)
Left VM that ferritin is 60-still in normal range. F/U as scheduled for labs.

## 2019-07-05 NOTE — Telephone Encounter (Signed)
-----   Message from Beth Pier, MD sent at 07/04/2019  5:17 PM EDT ----- Please call patient, iron level is normal, return for labs as scheduled

## 2019-07-05 NOTE — Telephone Encounter (Signed)
Nurse Assessment Nurse: Raphael Gibney, RN, [name] Date/Time Beth Holden Time): 07/05/2019 11:00:21 AM Confirm and document reason for call. If symptomatic, describe symptoms. ---Caller states her BP was in the 160s yesterday at the cancer center where she goes for anemia. She sent the readings to Mychart. BP 160/72; pulse 80. BP 160/81 pulse 84; 160/81 pulse 84; BP 158/79; pulse 76; 165/91 at 4:30 am; 161/92 pulse 76 at 6:15 am ; 148/81 at 7:44 am; BP 146/82 at 10:56 am. took medication at 4:30 am. BP now 156/85. Pulse 71 Has the patient had close contact with a person known or suspected to have the novel coronavirus illness OR traveled / lives in area with major community spread (including international travel) in the last 14 days from the onset of symptoms? * If Asymptomatic, screen for exposure and travel within the last 14 days. ---No Does the patient have any new or worsening symptoms? ---Yes Will a triage be completed? ---Yes Related visit to physician within the last 2 weeks? ---No Does the PT have any chronic conditions? (i.e. diabetes, asthma, this includes High risk factors for pregnancy, etc.) ---Yes List chronic conditions. ---anemia; diabetes; HTN Is this a behavioral health or substance abuse call? ---No Guidelines Guideline Title Affirmed Question Affirmed Notes Nurse Date/Time (Eastern Time) Blood Pressure - High Systolic BP >= 098 OR Diastolic >= 119 Stringer, RN, [name] 07/05/2019 11:07:30 AMPLEASE NOTE: All timestamps contained within this report are represented as [place] Standard Time. CONFIDENTIALTY NOTICE: This fax transmission is intended only for the addressee. It contains information that is legally privileged, confidential or otherwise protected from use or disclosure. If you are not the intended recipient, you are strictly prohibited from reviewing, disclosing, copying using or disseminating any of this information or taking any action in reliance on or regarding this  information. If you have received this fax in error, please notify us immediately by telephone so that we can arrange for its return to [place]. Phone: [phone], Toll-Free: [phone], Fax: [phone] Page: 2 of 2 Call Id: [id number] [place]. Time Beth Holden Time) Disposition Final User 07/05/2019 11:12:34 AM SEE PCP WITHIN 3 DAYS Yes Raphael Gibney, RN, [name] Caller Disagree/Comply Comply Caller Understands Yes PreDisposition Call Doctor Care Advice Given Per Guideline SEE PCP WITHIN 3 DAYS: * You need to be seen within 2 or 3 days. CALL BACK IF: * Weakness or numbness of the face, arm or leg on one side of the body occurs * Difficulty walking, difficulty talking, or severe headache occurs * Your blood pressure is over 180/110 * Chest pain or difficulty breathing occurs * You become worse. CARE ADVICE given per High Blood Pressure (Adult) guideline.

## 2019-07-08 ENCOUNTER — Encounter: Payer: Self-pay | Admitting: Family Medicine

## 2019-07-08 ENCOUNTER — Ambulatory Visit (INDEPENDENT_AMBULATORY_CARE_PROVIDER_SITE_OTHER): Payer: Medicare Other | Admitting: Family Medicine

## 2019-07-08 ENCOUNTER — Other Ambulatory Visit: Payer: Self-pay

## 2019-07-08 VITALS — BP 155/98 | HR 72 | Temp 98.5°F | Ht 65.0 in | Wt 232.8 lb

## 2019-07-08 DIAGNOSIS — I10 Essential (primary) hypertension: Secondary | ICD-10-CM

## 2019-07-08 DIAGNOSIS — E1159 Type 2 diabetes mellitus with other circulatory complications: Secondary | ICD-10-CM

## 2019-07-08 DIAGNOSIS — I152 Hypertension secondary to endocrine disorders: Secondary | ICD-10-CM

## 2019-07-08 MED ORDER — HYDROCHLOROTHIAZIDE 12.5 MG PO CAPS: 12.5000 mg | ORAL_CAPSULE | Freq: Every day | ORAL | 1 refills | Status: DC

## 2019-07-08 NOTE — Patient Instructions (Signed)
It was very nice to see you today!  Please start the HCTZ.  Please keep an eye on your BP and send a message in a few weeks to let me know how it is looking.  Take care, Dr Jerline Pain  Please try these tips to maintain a healthy lifestyle:   Eat at least 3 REAL meals and 1-2 snacks per day.  Aim for no more than 5 hours between eating.  If you eat breakfast, please do so within one hour of getting up.    Each meal should contain half fruits/vegetables, one quarter protein, and one quarter carbs (no bigger than a computer mouse)   Cut down on sweet beverages. This includes juice, soda, and sweet tea.     Drink at least 1 glass of water with each meal and aim for at least 8 glasses per day   Exercise at least 150 minutes every week.

## 2019-07-08 NOTE — Progress Notes (Signed)
   Beth Holden is a 74 y.o. female who presents today for an office visit.  Assessment/Plan:  Chronic Problems Addressed Today: Hypertension associated with diabetes ([place]) Uncontrolled..  Start HCTZ 12.5 mg daily.  Continue irbesartan 75 mg daily and metoprolol 25 mg daily.  She will check in with me in a couple weeks via MyChart.    Subjective:  HPI:  Patient here with concern for elevated blood pressure readings.  Was initially noted to be elevated at hematology visit last week she has been checking at home and has been typically in the 150s over 90s.  No chest pain.  No shortness of breath.  No headache.  No vision changes.  No obvious reason for increased blood pressure readings.  No increased salt intake.  No recent illnesses.       Objective:  Physical Exam: BP (!) 155/98   Pulse 72   Temp 98.5 F (36.9 C) (Temporal)   Ht 5\' 5"  (1.651 m)   Wt 232 lb 12.8 oz (105.6 kg)   SpO2 95%   BMI 38.74 kg/m   Gen: No acute distress, resting comfortably CV: Regular rate and rhythm with no murmurs appreciated Pulm: Normal work of breathing, clear to auscultation bilaterally with no crackles, wheezes, or rhonchi Neuro: Grossly normal, moves all extremities Psych: Normal affect and thought content      Beth Holden M. Jerline Pain, MD 07/08/2019 11:52 AM

## 2019-07-08 NOTE — Assessment & Plan Note (Signed)
Uncontrolled..  Start HCTZ 12.5 mg daily.  Continue irbesartan 75 mg daily and metoprolol 25 mg daily.  She will check in with me in a couple weeks via MyChart.

## 2019-07-09 NOTE — Telephone Encounter (Signed)
Patient was seen in office

## 2019-07-16 ENCOUNTER — Other Ambulatory Visit: Payer: Self-pay | Admitting: Gastroenterology

## 2019-07-22 ENCOUNTER — Other Ambulatory Visit: Payer: Self-pay | Admitting: *Deleted

## 2019-07-22 ENCOUNTER — Other Ambulatory Visit: Payer: Self-pay

## 2019-07-22 MED ORDER — IRBESARTAN-HYDROCHLOROTHIAZIDE 150-12.5 MG PO TABS
1.0000 | ORAL_TABLET | Freq: Every day | ORAL | 1 refills | Status: DC
Start: 2019-07-22 — End: 2019-12-26

## 2019-07-22 MED ORDER — CETIRIZINE HCL 10 MG PO TABS: 10.0000 mg | ORAL_TABLET | Freq: Every day | ORAL | 0 refills | Status: DC

## 2019-07-25 ENCOUNTER — Ambulatory Visit (INDEPENDENT_AMBULATORY_CARE_PROVIDER_SITE_OTHER): Payer: Medicare Other | Admitting: Orthopedic Surgery

## 2019-07-25 ENCOUNTER — Other Ambulatory Visit: Payer: Self-pay

## 2019-07-25 ENCOUNTER — Encounter: Payer: Self-pay | Admitting: Orthopedic Surgery

## 2019-07-25 VITALS — Ht 65.0 in | Wt 232.8 lb

## 2019-07-25 DIAGNOSIS — M14672 Charcot's joint, left ankle and foot: Secondary | ICD-10-CM | POA: Diagnosis not present

## 2019-07-25 DIAGNOSIS — B351 Tinea unguium: Secondary | ICD-10-CM

## 2019-07-25 DIAGNOSIS — I872 Venous insufficiency (chronic) (peripheral): Secondary | ICD-10-CM

## 2019-07-25 DIAGNOSIS — L97521 Non-pressure chronic ulcer of other part of left foot limited to breakdown of skin: Secondary | ICD-10-CM | POA: Diagnosis not present

## 2019-08-02 ENCOUNTER — Telehealth: Payer: Self-pay | Admitting: Family Medicine

## 2019-08-02 NOTE — Progress Notes (Signed)
  Chronic Care Management   Outreach Note  08/02/2019 Name: Beth Holden MRN: [id number] DOB: 30-Dec-1945  Referred by: Vivi Barrack, MD Reason for referral : Chronic Care Management (Initial CCM Outreach)   An unsuccessful telephone outreach was attempted today. The patient was referred to the pharmacist for assistance with care management and care coordination.   Follow Up Plan:    [place]

## 2019-08-12 ENCOUNTER — Encounter: Payer: Self-pay | Admitting: Orthopedic Surgery

## 2019-08-12 NOTE — Progress Notes (Signed)
Office Visit Note   [name]           Date of Birth: 19-Aug-1945           MRN: [id number] Visit Date: 07/25/2019              Requested by: Vivi Barrack, MD [address] [place],  [place] [address] PCP: Vivi Barrack, MD  Chief Complaint  Patient presents with  . Left Foot - Follow-up  . Right Foot - Follow-up      HPI: Patient is a 74 year old woman who presents in follow-up for Charcot arthropathy left foot as well as onychomycotic nails.  Patient states she has been having bleeding on the plantar aspect the left foot she is currently wearing knee-high compression stockings for her venous insufficiency.  Assessment & Plan: Visit Diagnoses:  1. Charcot's joint, left ankle and foot   2. Ulcer of left foot, limited to breakdown of skin ([place])   3. Onychomycosis   4. Venous insufficiency (chronic) (peripheral)     Plan: Ulcer was debrided of skin and soft tissue plantar aspect the left foot she tolerated this well continue with protective shoe wear.  Follow-Up Instructions: Return in about 4 weeks (around 08/22/2019).   Ortho Exam  Patient is alert, oriented, no adenopathy, well-dressed, normal affect, normal respiratory effort. Examination patient has a stable Charcot rocker-bottom deformity left foot no redness no cellulitis no signs of active Charcot process.  She has a worsening Wagner grade 1 ulcer on the plantar aspect the left foot that measures 10 mm in diameter prior to debridement.  After informed consent a 10 blade knife was used to debride the skin and soft tissue back to bleeding viable granulation tissue silver nitrate she is hemostasis sterile dressing was applied after debridement the wound is 2 cm in diameter 3 mm deep with no exposed bone or tendon and healthy granulation tissue.  Imaging: No results found. No images are attached to the encounter.  Labs: Lab Results  Component Value Date   HGBA1C 7.4 (A) 11/23/2018   HGBA1C 7.4 (A)  07/10/2018   HGBA1C 6.6 (A) 11/28/2017   ESRSEDRATE 36 (H) 02/24/2017   CRP 1.2 (H) 02/24/2017   LABURIC 5.5 09/01/2015   LABORGA GROUP B STREP (S.AGALACTIAE) ISOLATED 12/26/2014     Lab Results  Component Value Date   ALBUMIN 3.8 05/19/2017   ALBUMIN 3.6 04/21/2017   ALBUMIN 3.6 02/24/2017   PREALBUMIN 19.4 03/05/2012   LABURIC 5.5 09/01/2015    No results found for: MG No results found for: VD25OH  Lab Results  Component Value Date   PREALBUMIN 19.4 03/05/2012   CBC EXTENDED Latest Ref Rng & Units 07/04/2019 02/22/2019 11/22/2018  WBC 4.0 - 10.5 K/uL 10.5 11.1(H) 10.8(H)  RBC 3.87 - 5.11 MIL/uL 4.27 4.68 4.64  HGB 12.0 - 15.0 g/dL 11.8(L) 12.6 12.7  HCT 36 - 46 % 37.6 40.0 40.3  PLT 150 - 400 K/uL 375 327 380  NEUTROABS 1.7 - 7.7 K/uL 7.6 7.2 7.7  LYMPHSABS 0.7 - 4.0 K/uL 2.0 2.9 2.2     Body mass index is 38.74 kg/m.  Orders:  No orders of the defined types were placed in this encounter.  No orders of the defined types were placed in this encounter.    Procedures: No procedures performed  Clinical Data: No additional findings.  ROS:  All other systems negative, except as noted in the HPI. Review of Systems  Objective: Vital Signs: Ht  5\' 5"  (1.651 m)   Wt 232 lb 12.8 oz (105.6 kg)   BMI 38.74 kg/m   Specialty Comments:  No specialty comments available.  PMFS History: Patient Active Problem List   Diagnosis Date Noted  . Hyperlipidemia associated with type 2 diabetes mellitus ([place]) 11/30/2017  . Impingement syndrome of right shoulder 07/06/2017  . Iron deficiency anemia 05/11/2017  . Morbid obesity ([place]) 04/24/2017  . Anemia 02/24/2017  . Baker's cyst 07/10/2016  . Onychomycosis 12/07/2015  . Upper airway cough syndrome 03/05/2014  . History of amputation of lesser toe of right foot ([place]) 04/15/2013  . Hypertension associated with diabetes ([place]) 12/05/2006  . Uncontrolled type 2 diabetes mellitus with peripheral circulatory disorder ([place])  10/12/2006  . Dyslipidemia associated with type 2 diabetes mellitus ([place]) 10/12/2006  . Allergic rhinitis 10/12/2006  . GERD - Followed by Dr. Fuller Plan 10/12/2006  . IRRITABLE BOWEL SYNDROME - followed by Dr. Fuller Plan 10/12/2006  . Peripheral neuropathy 10/11/2006  . ALOPECIA NEC 10/11/2006   Past Medical History:  Diagnosis Date  . Alopecia   . Anemia, mild   . Arthritis   . Chronic cough    sees pulmonologist  . Chronic pain    chest wall and abd - s/p extensive eval  . Diabetes mellitus with neuropathy ([place])    sees endocrine  . Diverticulosis   . Fatty liver   . GERD (gastroesophageal reflux disease)    takes Nexium bid, hx erosive esophagitis  . Headache(784.0)    occasionally;r/t sinus   . History of colon polyps   . HTN (hypertension)   . Hx of amputation of lesser toe ([place])    sees podiatrist  . Hyperlipemia   . IBS (irritable bowel syndrome)   . Insomnia    takes Elavil nightly  . Joint pain   . Neuropathy   . Osteomyelitis ([place])   . Pneumonia   . PONV (postoperative nausea and vomiting)   . Seasonal allergies    takes Zyrtec daily  . Sinus tachycardia     Family History  Problem Relation Age of Onset  . Lung cancer Mother 29       smoked heavily  . Emphysema Mother   . Hypertension Father   . Hyperlipidemia Father   . Diabetes Father   . Coronary artery disease Father   . Dementia Father   . COPD Father        smoked  . Stomach cancer Paternal Aunt   . Brain cancer Paternal Uncle   . Irritable bowel syndrome Other        Several family members on fathers side   . Diabetes Other   . Stomach cancer Maternal Aunt   . Lung cancer Paternal Uncle   . Heart disease Paternal Uncle   . Colon cancer Neg Hx     Past Surgical History:  Procedure Laterality Date  . AMPUTATION  12/28/2011   Procedure: AMPUTATION DIGIT;  Surgeon: Newt Minion, MD;  Location: [facility];  Service: Orthopedics;  Laterality: Right;  Right Foot 2nd Toe Amputation at MTP  (metatarsophalangeal joint)  . AMPUTATION Right 04/25/2012   Procedure: Right Foot 3rd Toe Amputation;  Surgeon: Newt Minion, MD;  Location: [facility];  Service: Orthopedics;  Laterality: Right;  Right Foot Third Toe Amputation   . AMPUTATION Right 07/27/2012   Procedure: Right 4th Toe Amputation at Metatarsophalangeal;  Surgeon: Newt Minion, MD;  Location: [facility];  Service: Orthopedics;  Laterality: Right;  Right 4th  Toe Amputation at Metatarsophalangeal  . AMPUTATION Left 07/15/2016   Procedure: Left 2nd Ray Amputation;  Surgeon: Newt Minion, MD;  Location: [facility];  Service: Orthopedics;  Laterality: Left;  . AMPUTATION Left 11/18/2016   Procedure: Left 3rd and 4th Ray Amputation;  Surgeon: Newt Minion, MD;  Location: [facility];  Service: Orthopedics;  Laterality: Left;  . AMPUTATION Right 03/29/2017   Procedure: RIGHT FOOT 3RD AND 4TH RAY AMPUTATION;  Surgeon: Newt Minion, MD;  Location: [facility];  Service: Orthopedics;  Laterality: Right;  . COLONOSCOPY    . LAPAROSCOPIC APPENDECTOMY  01/05/2011   Procedure: APPENDECTOMY LAPAROSCOPIC;  Surgeon: Pedro Earls, MD;  [facility];  Service: General;  Laterality: N/A;  . OOPHORECTOMY  2001  . ROTATOR CUFF REPAIR Right    x 2  . TUBAL LIGATION    . VAGINAL HYSTERECTOMY  2001   Social History   Occupational History  . Occupation: Retired   Tobacco Use  . Smoking status: Never Smoker  . Smokeless tobacco: Never Used  Vaping Use  . Vaping Use: Never used  Substance and Sexual Activity  . Alcohol use: No  . Drug use: No  . Sexual activity: Yes    Birth control/protection: Surgical

## 2019-08-14 ENCOUNTER — Telehealth: Payer: Self-pay | Admitting: Family Medicine

## 2019-08-14 NOTE — Telephone Encounter (Signed)
Patient called in stating her nephew and girlfriend stopped by Cindy's house on Saturday, Jenny Reichmann got a call today from nephew's girlfriend that she has tested positive, but has absolutely no symptoms and is getting re-tested as of now but patient would like to know if she needs to go get tested. Does have a wedding to attend tomorrow afternoon, wants to know if she can go or not.

## 2019-08-14 NOTE — Telephone Encounter (Signed)
Patient was notified to get tested for Covid and watch for symptoms,due to husband having COPD suggested to not go to the wedding.

## 2019-08-16 DIAGNOSIS — Z20822 Contact with and (suspected) exposure to covid-19: Secondary | ICD-10-CM | POA: Diagnosis not present

## 2019-08-18 ENCOUNTER — Encounter: Payer: Self-pay | Admitting: Family Medicine

## 2019-08-22 ENCOUNTER — Ambulatory Visit (INDEPENDENT_AMBULATORY_CARE_PROVIDER_SITE_OTHER): Payer: Medicare Other | Admitting: Orthopedic Surgery

## 2019-08-22 ENCOUNTER — Encounter: Payer: Self-pay | Admitting: Orthopedic Surgery

## 2019-08-22 DIAGNOSIS — M14672 Charcot's joint, left ankle and foot: Secondary | ICD-10-CM | POA: Diagnosis not present

## 2019-08-22 DIAGNOSIS — L97521 Non-pressure chronic ulcer of other part of left foot limited to breakdown of skin: Secondary | ICD-10-CM

## 2019-08-22 DIAGNOSIS — I872 Venous insufficiency (chronic) (peripheral): Secondary | ICD-10-CM

## 2019-08-22 DIAGNOSIS — B351 Tinea unguium: Secondary | ICD-10-CM

## 2019-08-22 NOTE — Progress Notes (Signed)
Office Visit Note   [name]           Date of Birth: [birth year]           MRN: [id number] Visit Date: 08/22/2019              Requested by: Vivi Barrack, MD [address] [place],  [place] [address] PCP: Vivi Barrack, MD  Chief Complaint  Patient presents with  . Right Foot - Follow-up  . Left Foot - Follow-up      HPI: Patient is a 74 year old woman who presents in follow-up for bilateral lower extremities she complains of painful onychomycotic nails she feels the ulcer on the left foot is stable she is wearing compression stockings she denies any venous ulcers.  Assessment & Plan: Visit Diagnoses:  1. Charcot's joint, left ankle and foot   2. Ulcer of left foot, limited to breakdown of skin ([place])   3. Onychomycosis   4. Venous insufficiency (chronic) (peripheral)     Plan: Nails were trimmed x4 without complication ulcer debrided of skin and soft tissue this continues to improve continue with her protective shoe wear continue with compression stockings.  Follow-Up Instructions: Return in about 4 weeks (around 09/19/2019).   Ortho Exam  Patient is alert, oriented, no adenopathy, well-dressed, normal affect, normal respiratory effort. Examination patient has no redness or cellulitis in either foot the venous swelling in the legs is well controlled and there are no venous ulcers.  Patient does have a Wagner grade 1 ulcer on the plantar aspect of the left foot with the Charcot rocker-bottom deformity the ulcer is 10 mm in diameter.  After informed consent a 10 blade knife was used to debride the skin and soft tissue back to healthy viable granulation tissue silver nitrate was used hemostasis the ulcer is 25 mm in diameter after debridement 3 mm deep a Band-Aid was applied.  Patient has no ulcers on the right foot.  Nails were trimmed x4 without complications.  Imaging: No results found. No images are attached to the encounter.  Labs: Lab Results  Component  Value Date   HGBA1C 7.4 (A) 11/23/2018   HGBA1C 7.4 (A) 07/10/2018   HGBA1C 6.6 (A) 11/28/2017   ESRSEDRATE 36 (H) 02/24/2017   CRP 1.2 (H) 02/24/2017   LABURIC 5.5 09/01/2015   LABORGA GROUP B STREP (S.AGALACTIAE) ISOLATED 12/26/2014     Lab Results  Component Value Date   ALBUMIN 3.8 05/19/2017   ALBUMIN 3.6 04/21/2017   ALBUMIN 3.6 02/24/2017   PREALBUMIN 19.4 03/05/2012   LABURIC 5.5 09/01/2015    No results found for: MG No results found for: VD25OH  Lab Results  Component Value Date   PREALBUMIN 19.4 03/05/2012   CBC EXTENDED Latest Ref Rng & Units 07/04/2019 02/22/2019 11/22/2018  WBC 4.0 - 10.5 K/uL 10.5 11.1(H) 10.8(H)  RBC 3.87 - 5.11 MIL/uL 4.27 4.68 4.64  HGB 12.0 - 15.0 g/dL 11.8(L) 12.6 12.7  HCT 36 - 46 % 37.6 40.0 40.3  PLT 150 - 400 K/uL 375 327 380  NEUTROABS 1.7 - 7.7 K/uL 7.6 7.2 7.7  LYMPHSABS 0.7 - 4.0 K/uL 2.0 2.9 2.2     There is no height or weight on file to calculate BMI.  Orders:  No orders of the defined types were placed in this encounter.  No orders of the defined types were placed in this encounter.    Procedures: No procedures performed  Clinical Data: No additional findings.  ROS:  All other systems negative, except as noted in the HPI. Review of Systems  Objective: Vital Signs: There were no vitals taken for this visit.  Specialty Comments:  No specialty comments available.  PMFS History: Patient Active Problem List   Diagnosis Date Noted  . Hyperlipidemia associated with type 2 diabetes mellitus ([place]) 11/30/2017  . Impingement syndrome of right shoulder 07/06/2017  . Iron deficiency anemia 05/11/2017  . Morbid obesity ([place]) 04/24/2017  . Anemia 02/24/2017  . Baker's cyst 07/10/2016  . Onychomycosis 12/07/2015  . Upper airway cough syndrome 03/05/2014  . History of amputation of lesser toe of right foot ([place]) 04/15/2013  . Hypertension associated with diabetes ([place]) 12/05/2006  . Uncontrolled type 2 diabetes  mellitus with peripheral circulatory disorder ([place]) 10/12/2006  . Dyslipidemia associated with type 2 diabetes mellitus ([place]) 10/12/2006  . Allergic rhinitis 10/12/2006  . GERD - Followed by Dr. Fuller Plan 10/12/2006  . IRRITABLE BOWEL SYNDROME - followed by Dr. Fuller Plan 10/12/2006  . Peripheral neuropathy 10/11/2006  . ALOPECIA NEC 10/11/2006   Past Medical History:  Diagnosis Date  . Alopecia   . Anemia, mild   . Arthritis   . Chronic cough    sees pulmonologist  . Chronic pain    chest wall and abd - s/p extensive eval  . Diabetes mellitus with neuropathy ([place])    sees endocrine  . Diverticulosis   . Fatty liver   . GERD (gastroesophageal reflux disease)    takes Nexium bid, hx erosive esophagitis  . Headache(784.0)    occasionally;r/t sinus   . History of colon polyps   . HTN (hypertension)   . Hx of amputation of lesser toe ([place])    sees podiatrist  . Hyperlipemia   . IBS (irritable bowel syndrome)   . Insomnia    takes Elavil nightly  . Joint pain   . Neuropathy   . Osteomyelitis ([place])   . Pneumonia   . PONV (postoperative nausea and vomiting)   . Seasonal allergies    takes Zyrtec daily  . Sinus tachycardia     Family History  Problem Relation Age of Onset  . Lung cancer Mother 17       smoked heavily  . Emphysema Mother   . Hypertension Father   . Hyperlipidemia Father   . Diabetes Father   . Coronary artery disease Father   . Dementia Father   . COPD Father        smoked  . Stomach cancer Paternal Aunt   . Brain cancer Paternal Uncle   . Irritable bowel syndrome Other        Several family members on fathers side   . Diabetes Other   . Stomach cancer Maternal Aunt   . Lung cancer Paternal Uncle   . Heart disease Paternal Uncle   . Colon cancer Neg Hx     Past Surgical History:  Procedure Laterality Date  . AMPUTATION  12/28/2011   Procedure: AMPUTATION DIGIT;  Surgeon: Newt Minion, MD;  Location: [facility];  Service: Orthopedics;  Laterality: Right;   Right Foot 2nd Toe Amputation at MTP (metatarsophalangeal joint)  . AMPUTATION Right 04/25/2012   Procedure: Right Foot 3rd Toe Amputation;  Surgeon: Newt Minion, MD;  Location: [facility];  Service: Orthopedics;  Laterality: Right;  Right Foot Third Toe Amputation   . AMPUTATION Right 07/27/2012   Procedure: Right 4th Toe Amputation at Metatarsophalangeal;  Surgeon: Newt Minion, MD;  Location: [facility];  Service: Orthopedics;  Laterality: Right;  Right 4th Toe Amputation at Metatarsophalangeal  . AMPUTATION Left 07/15/2016   Procedure: Left 2nd Ray Amputation;  Surgeon: Newt Minion, MD;  Location: [facility];  Service: Orthopedics;  Laterality: Left;  . AMPUTATION Left 11/18/2016   Procedure: Left 3rd and 4th Ray Amputation;  Surgeon: Newt Minion, MD;  Location: [facility];  Service: Orthopedics;  Laterality: Left;  . AMPUTATION Right 03/29/2017   Procedure: RIGHT FOOT 3RD AND 4TH RAY AMPUTATION;  Surgeon: Newt Minion, MD;  Location: [facility];  Service: Orthopedics;  Laterality: Right;  . COLONOSCOPY    . LAPAROSCOPIC APPENDECTOMY  01/05/2011   Procedure: APPENDECTOMY LAPAROSCOPIC;  Surgeon: Pedro Earls, MD;  Location[facility];  Service: General;  Laterality: N/A;  . OOPHORECTOMY  2001  . ROTATOR CUFF REPAIR Right    x 2  . TUBAL LIGATION    . VAGINAL HYSTERECTOMY  2001   Social History   Occupational History  . Occupation: Retired   Tobacco Use  . Smoking status: Never Smoker  . Smokeless tobacco: Never Used  Vaping Use  . Vaping Use: Never used  Substance and Sexual Activity  . Alcohol use: No  . Drug use: No  . Sexual activity: Yes    Birth control/protection: Surgical

## 2019-08-29 ENCOUNTER — Telehealth: Payer: Self-pay | Admitting: Family Medicine

## 2019-08-29 NOTE — Progress Notes (Signed)
  Chronic Care Management   Outreach Note  08/29/2019 Name: Beth Holden MRN: [id number] DOB: 1945/08/31  Referred by: Vivi Barrack, MD Reason for referral : No chief complaint on file.   An unsuccessful telephone outreach was attempted today. The patient was referred to the pharmacist for assistance with care management and care coordination.   Follow Up Plan:   [name] Upstream Scheduler

## 2019-09-10 ENCOUNTER — Telehealth: Payer: Self-pay | Admitting: Family Medicine

## 2019-09-10 NOTE — Progress Notes (Signed)
  Chronic Care Management   Outreach Note  09/10/2019 Name: Beth Holden MRN: [id number] DOB: Jan 26, 1945  Referred by: Vivi Barrack, MD Reason for referral : No chief complaint on file.   An unsuccessful telephone outreach was attempted today. The patient was referred to the pharmacist for assistance with care management and care coordination.   Follow Up Plan:   [name] Upstream Scheduler

## 2019-09-11 ENCOUNTER — Telehealth: Payer: Self-pay

## 2019-09-11 ENCOUNTER — Other Ambulatory Visit: Payer: Self-pay | Admitting: Family Medicine

## 2019-09-11 NOTE — Telephone Encounter (Signed)
Pt wants to know why she has been referred to chronic care management

## 2019-09-11 NOTE — Telephone Encounter (Signed)
See below

## 2019-09-11 NOTE — Telephone Encounter (Signed)
Spoke with pt, she is not interested in chronic care management and would not like to be scheduled with clinical pharmacist.

## 2019-09-17 ENCOUNTER — Other Ambulatory Visit: Payer: Self-pay

## 2019-09-17 MED ORDER — INSULIN REGULAR HUMAN 100 UNIT/ML IJ SOLN: INTRAMUSCULAR | 1 refills | Status: DC

## 2019-09-25 ENCOUNTER — Encounter: Payer: Self-pay | Admitting: Gastroenterology

## 2019-09-25 ENCOUNTER — Ambulatory Visit (INDEPENDENT_AMBULATORY_CARE_PROVIDER_SITE_OTHER): Payer: Medicare Other | Admitting: Gastroenterology

## 2019-09-25 ENCOUNTER — Other Ambulatory Visit (INDEPENDENT_AMBULATORY_CARE_PROVIDER_SITE_OTHER): Payer: Medicare Other

## 2019-09-25 VITALS — BP 126/66 | HR 70 | Ht 65.0 in | Wt 228.6 lb

## 2019-09-25 DIAGNOSIS — D509 Iron deficiency anemia, unspecified: Secondary | ICD-10-CM

## 2019-09-25 DIAGNOSIS — K219 Gastro-esophageal reflux disease without esophagitis: Secondary | ICD-10-CM

## 2019-09-25 DIAGNOSIS — K582 Mixed irritable bowel syndrome: Secondary | ICD-10-CM | POA: Diagnosis not present

## 2019-09-25 LAB — CBC WITH DIFFERENTIAL/PLATELET
Basophils Absolute: 0.1 10*3/uL (ref 0.0–0.1)
Basophils Relative: 0.7 % (ref 0.0–3.0)
Eosinophils Absolute: 0.2 10*3/uL (ref 0.0–0.7)
Eosinophils Relative: 2.1 % (ref 0.0–5.0)
HCT: 38.2 % (ref 36.0–46.0)
Hemoglobin: 12.4 g/dL (ref 12.0–15.0)
Lymphocytes Relative: 20.8 % (ref 12.0–46.0)
Lymphs Abs: 2.2 10*3/uL (ref 0.7–4.0)
MCHC: 32.4 g/dL (ref 30.0–36.0)
MCV: 83.2 fl (ref 78.0–100.0)
Monocytes Absolute: 0.6 10*3/uL (ref 0.1–1.0)
Monocytes Relative: 5.7 % (ref 3.0–12.0)
Neutro Abs: 7.6 10*3/uL (ref 1.4–7.7)
Neutrophils Relative %: 70.7 % (ref 43.0–77.0)
Platelets: 391 10*3/uL (ref 150.0–400.0)
RBC: 4.59 Mil/uL (ref 3.87–5.11)
RDW: 14.3 % (ref 11.5–15.5)
WBC: 10.7 10*3/uL — ABNORMAL HIGH (ref 4.0–10.5)

## 2019-09-25 LAB — FERRITIN: Ferritin: 59.2 ng/mL (ref 10.0–291.0)

## 2019-09-25 MED ORDER — OMEPRAZOLE 40 MG PO CPDR: 40.0000 mg | DELAYED_RELEASE_CAPSULE | Freq: Two times a day (BID) | ORAL | 3 refills | Status: DC

## 2019-09-25 MED ORDER — DICYCLOMINE HCL 10 MG PO CAPS
10.0000 mg | ORAL_CAPSULE | Freq: Three times a day (TID) | ORAL | 5 refills | Status: DC | PRN
Start: 2019-09-25 — End: 2021-11-10

## 2019-09-25 NOTE — Progress Notes (Signed)
    History of Present Illness: This is a 74 year old female with GERD and alternating bowel habits.  She relates a long history of difficulties with alternating constipation and diarrhea.  The diarrhea phases have become more frequent with significant urgency when they occur.  At times she is constipated and uses MiraLAX which is effective.  She relates lower abdominal discomfort, abdominal bloating and intestinal gas.  The symptoms often proceed episodes of diarrhea with urgency but not always.  Her reflux symptoms are well controlled.  She complains of fatigue.  Hemoglobin in June had decreased slightly to 11.8 and ferritin had decreased slightly to 60.  Current Medications, Allergies, Past Medical History, Past Surgical History, Family History and Social History were reviewed in [employer] record.   Physical Exam: General: Well developed, well nourished, no acute distress Head: Normocephalic and atraumatic Eyes:  sclerae anicteric, EOMI Ears: Normal auditory acuity Mouth: Not examined, mask on during Covid-19 pandemic Lungs: Clear throughout to auscultation Heart: Regular rate and rhythm; no murmurs, rubs or bruits Abdomen: Soft, non tender and non distended. No masses, hepatosplenomegaly or hernias noted. Normal Bowel sounds Rectal: Not done Musculoskeletal: Symmetrical with no gross deformities  Pulses:  Normal pulses noted Extremities: No clubbing, cyanosis, edema or deformities noted Neurological: Alert oriented x 4, grossly nonfocal Psychological:  Alert and cooperative. Normal mood and affect   Assessment and Recommendations:  1. GERD.  Follow antireflux measures.  Continue omeprazole 40 mg p.o. twice daily. REV in 1 year.  2. IDA.  CBC and ferritin today.  Further follow-up with Dr. Benay Spice.  3. IBS-A.  MiraLAX daily as needed for constipation.  Dicyclomine 10 mg 3 times daily as needed for abdominal bloating, abdominal discomfort and urgency.   Patient is advised to call in 2-3 weeks if this is not effective in managing symptoms. REV in 1 year.

## 2019-09-25 NOTE — Patient Instructions (Signed)
Your provider has requested that you go to the basement level for lab work before leaving today. Press "B" on the elevator. The lab is located at the first door on the left as you exit the elevator.  We have sent the following medications to your pharmacy for you to pick up at your convenience: dicyclomine.   We have sent the following prescriptions to your mail in pharmacy: omeprazole   If you have not heard from your mail in pharmacy within 1 week or if you have not received your medication in the mail, please contact us at [phone] so we may find out why.  Thank you for choosing me and [facility].  Pricilla Riffle. Dagoberto Ligas., MD., [facility]  Due to recent changes in healthcare laws, you may see the results of your imaging and laboratory studies on MyChart before your provider has had a chance to review them.  We understand that in some cases there may be results that are confusing or concerning to you. Not all laboratory results come back in the same time frame and the provider may be waiting for multiple results in order to interpret others.  Please give [place] 48 hours in order for your provider to thoroughly review all the results before contacting the office for clarification of your results.

## 2019-09-26 ENCOUNTER — Encounter: Payer: Self-pay | Admitting: Orthopedic Surgery

## 2019-09-26 ENCOUNTER — Ambulatory Visit (INDEPENDENT_AMBULATORY_CARE_PROVIDER_SITE_OTHER): Payer: Medicare Other | Admitting: Orthopedic Surgery

## 2019-09-26 VITALS — Ht 65.0 in | Wt 228.0 lb

## 2019-09-26 DIAGNOSIS — M14672 Charcot's joint, left ankle and foot: Secondary | ICD-10-CM | POA: Diagnosis not present

## 2019-09-26 DIAGNOSIS — L97521 Non-pressure chronic ulcer of other part of left foot limited to breakdown of skin: Secondary | ICD-10-CM

## 2019-09-26 DIAGNOSIS — I872 Venous insufficiency (chronic) (peripheral): Secondary | ICD-10-CM

## 2019-09-26 NOTE — Progress Notes (Signed)
Office Visit Note   [name]           Date of Birth: December 19, 1945           MRN: [id number] Visit Date: 09/26/2019              Requested by: Vivi Barrack, MD [address] [place],  [place] [address] PCP: Vivi Barrack, MD  Chief Complaint  Patient presents with  . Right Foot - Follow-up  . Left Foot - Follow-up      HPI: Patient is a 74 year old woman who presents in follow-up for venous insufficiency both legs Charcot collapse left foot partial foot amputations with a Wagner grade 1 ulcer on the Charcot rocker-bottom deformity left foot she states she has been using Silvadene dressing changes twice a day to the ulcer.  Assessment & Plan: Visit Diagnoses:  1. Charcot's joint, left ankle and foot   2. Ulcer of left foot, limited to breakdown of skin ([facility])   3. Venous insufficiency (chronic) (peripheral)     Plan: Ulcer was debrided of skin and soft tissue she will try using a small amount of Silvadene every other day reevaluate in 4 weeks  Follow-Up Instructions: Return in about 4 weeks (around 10/24/2019).   Ortho Exam  Patient is alert, oriented, no adenopathy, well-dressed, normal affect, normal respiratory effort. Examination patient's feet are plantigrade she has a stable Charcot rocker-bottom deformity of the left foot with no redness no cellulitis no odor no drainage from the ulcer.  The ulcer is 10 mm in diameter.  After informed consent a 10 blade knife was used to debride the skin and soft tissue back to healthy viable bleeding granulation tissue.  A Band-Aid was applied after debridement the ulcer is 2 cm in diameter and 1 mm deep.  Right foot has no ulcers.  Imaging: No results found. No images are attached to the encounter.  Labs: Lab Results  Component Value Date   HGBA1C 7.4 (A) 11/23/2018   HGBA1C 7.4 (A) 07/10/2018   HGBA1C 6.6 (A) 11/28/2017   ESRSEDRATE 36 (H) 02/24/2017   CRP 1.2 (H) 02/24/2017   LABURIC 5.5 09/01/2015   LABORGA  GROUP B STREP (S.AGALACTIAE) ISOLATED 12/26/2014     Lab Results  Component Value Date   ALBUMIN 3.8 05/19/2017   ALBUMIN 3.6 04/21/2017   ALBUMIN 3.6 02/24/2017   PREALBUMIN 19.4 03/05/2012   LABURIC 5.5 09/01/2015    No results found for: MG No results found for: VD25OH  Lab Results  Component Value Date   PREALBUMIN 19.4 03/05/2012   CBC EXTENDED Latest Ref Rng & Units 09/25/2019 07/04/2019 02/22/2019  WBC 4.0 - 10.5 K/uL 10.7(H) 10.5 11.1(H)  RBC 3.87 - 5.11 Mil/uL 4.59 4.27 4.68  HGB 12.0 - 15.0 g/dL 12.4 11.8(L) 12.6  HCT 36 - 46 % 38.2 37.6 40.0  PLT 150 - 400 K/uL 391.0 375 327  NEUTROABS 1.4 - 7.7 K/uL 7.6 7.6 7.2  LYMPHSABS 0.7 - 4.0 K/uL 2.2 2.0 2.9     Body mass index is 37.94 kg/m.  Orders:  No orders of the defined types were placed in this encounter.  No orders of the defined types were placed in this encounter.    Procedures: No procedures performed  Clinical Data: No additional findings.  ROS:  All other systems negative, except as noted in the HPI. Review of Systems  Objective: Vital Signs: Ht 5\' 5"  (1.651 m)   Wt 228 lb (103.4 kg)  BMI 37.94 kg/m   Specialty Comments:  No specialty comments available.  PMFS History: Patient Active Problem List   Diagnosis Date Noted  . Hyperlipidemia associated with type 2 diabetes mellitus ([place]) 11/30/2017  . Impingement syndrome of right shoulder 07/06/2017  . Iron deficiency anemia 05/11/2017  . Morbid obesity ([place]) 04/24/2017  . Anemia 02/24/2017  . Baker's cyst 07/10/2016  . Onychomycosis 12/07/2015  . Upper airway cough syndrome 03/05/2014  . History of amputation of lesser toe of right foot ([place]) 04/15/2013  . Hypertension associated with diabetes ([place]) 12/05/2006  . Uncontrolled type 2 diabetes mellitus with peripheral circulatory disorder ([place]) 10/12/2006  . Dyslipidemia associated with type 2 diabetes mellitus ([place]) 10/12/2006  . Allergic rhinitis 10/12/2006  . GERD - Followed by  Dr. Fuller Plan 10/12/2006  . IRRITABLE BOWEL SYNDROME - followed by Dr. Fuller Plan 10/12/2006  . Peripheral neuropathy 10/11/2006  . ALOPECIA NEC 10/11/2006   Past Medical History:  Diagnosis Date  . Alopecia   . Anemia, mild   . Arthritis   . Chronic cough    sees pulmonologist  . Chronic pain    chest wall and abd - s/p extensive eval  . Diabetes mellitus with neuropathy ([place])    sees endocrine  . Diverticulosis   . Fatty liver   . GERD (gastroesophageal reflux disease)    takes Nexium bid, hx erosive esophagitis  . Headache(784.0)    occasionally;r/t sinus   . History of colon polyps   . HTN (hypertension)   . Hx of amputation of lesser toe ([place])    sees podiatrist  . Hyperlipemia   . IBS (irritable bowel syndrome)   . Insomnia    takes Elavil nightly  . Joint pain   . Neuropathy   . Osteomyelitis ([place])   . Pneumonia   . PONV (postoperative nausea and vomiting)   . Seasonal allergies    takes Zyrtec daily  . Sinus tachycardia     Family History  Problem Relation Age of Onset  . Lung cancer Mother 33       smoked heavily  . Emphysema Mother   . Hypertension Father   . Hyperlipidemia Father   . Diabetes Father   . Coronary artery disease Father   . Dementia Father   . COPD Father        smoked  . Stomach cancer Paternal Aunt   . Brain cancer Paternal Uncle   . Irritable bowel syndrome Other        Several family members on fathers side   . Diabetes Other   . Stomach cancer Maternal Aunt   . Lung cancer Paternal Uncle   . Heart disease Paternal Uncle   . Colon cancer Neg Hx     Past Surgical History:  Procedure Laterality Date  . AMPUTATION  12/28/2011   Procedure: AMPUTATION DIGIT;  Surgeon: Newt Minion, MD;  Location: [facility];  Service: Orthopedics;  Laterality: Right;  Right Foot 2nd Toe Amputation at MTP (metatarsophalangeal joint)  . AMPUTATION Right 04/25/2012   Procedure: Right Foot 3rd Toe Amputation;  Surgeon: Newt Minion, MD;  Location: [facility];   Service: Orthopedics;  Laterality: Right;  Right Foot Third Toe Amputation   . AMPUTATION Right 07/27/2012   Procedure: Right 4th Toe Amputation at Metatarsophalangeal;  Surgeon: Newt Minion, MD;  Location: [facility];  Service: Orthopedics;  Laterality: Right;  Right 4th Toe Amputation at Metatarsophalangeal  . AMPUTATION Left 07/15/2016   Procedure: Left 2nd Ray  Amputation;  Surgeon: Newt Minion, MD;  Location: [facility];  Service: Orthopedics;  Laterality: Left;  . AMPUTATION Left 11/18/2016   Procedure: Left 3rd and 4th Ray Amputation;  Surgeon: Newt Minion, MD;  Location: [facility];  Service: Orthopedics;  Laterality: Left;  . AMPUTATION Right 03/29/2017   Procedure: RIGHT FOOT 3RD AND 4TH RAY AMPUTATION;  Surgeon: Newt Minion, MD;  Location: [facility];  Service: Orthopedics;  Laterality: Right;  . COLONOSCOPY    . LAPAROSCOPIC APPENDECTOMY  01/05/2011   Procedure: APPENDECTOMY LAPAROSCOPIC;  Surgeon: Pedro Earls, MD;  [facility];  Service: General;  Laterality: N/A;  . OOPHORECTOMY  2001  . ROTATOR CUFF REPAIR Right    x 2  . TUBAL LIGATION    . VAGINAL HYSTERECTOMY  2001   Social History   Occupational History  . Occupation: Retired   Tobacco Use  . Smoking status: Never Smoker  . Smokeless tobacco: Never Used  Vaping Use  . Vaping Use: Never used  Substance and Sexual Activity  . Alcohol use: No  . Drug use: No  . Sexual activity: Yes    Birth control/protection: Surgical

## 2019-10-20 ENCOUNTER — Encounter: Payer: Self-pay | Admitting: Nurse Practitioner

## 2019-10-22 ENCOUNTER — Other Ambulatory Visit: Payer: Self-pay | Admitting: Internal Medicine

## 2019-10-22 DIAGNOSIS — E1165 Type 2 diabetes mellitus with hyperglycemia: Secondary | ICD-10-CM

## 2019-10-22 DIAGNOSIS — IMO0002 Reserved for concepts with insufficient information to code with codable children: Secondary | ICD-10-CM

## 2019-10-22 DIAGNOSIS — E1151 Type 2 diabetes mellitus with diabetic peripheral angiopathy without gangrene: Secondary | ICD-10-CM

## 2019-10-23 DIAGNOSIS — E113293 Type 2 diabetes mellitus with mild nonproliferative diabetic retinopathy without macular edema, bilateral: Secondary | ICD-10-CM | POA: Insufficient documentation

## 2019-10-23 DIAGNOSIS — H2513 Age-related nuclear cataract, bilateral: Secondary | ICD-10-CM | POA: Insufficient documentation

## 2019-10-23 DIAGNOSIS — E113392 Type 2 diabetes mellitus with moderate nonproliferative diabetic retinopathy without macular edema, left eye: Secondary | ICD-10-CM | POA: Insufficient documentation

## 2019-10-23 DIAGNOSIS — H43811 Vitreous degeneration, right eye: Secondary | ICD-10-CM | POA: Insufficient documentation

## 2019-10-23 DIAGNOSIS — H3562 Retinal hemorrhage, left eye: Secondary | ICD-10-CM | POA: Insufficient documentation

## 2019-10-24 ENCOUNTER — Ambulatory Visit (INDEPENDENT_AMBULATORY_CARE_PROVIDER_SITE_OTHER): Payer: Medicare Other | Admitting: Orthopedic Surgery

## 2019-10-24 ENCOUNTER — Encounter: Payer: Self-pay | Admitting: Orthopedic Surgery

## 2019-10-24 VITALS — Ht 65.0 in | Wt 228.0 lb

## 2019-10-24 DIAGNOSIS — L97521 Non-pressure chronic ulcer of other part of left foot limited to breakdown of skin: Secondary | ICD-10-CM | POA: Diagnosis not present

## 2019-10-24 DIAGNOSIS — M14672 Charcot's joint, left ankle and foot: Secondary | ICD-10-CM | POA: Diagnosis not present

## 2019-10-24 DIAGNOSIS — I872 Venous insufficiency (chronic) (peripheral): Secondary | ICD-10-CM | POA: Diagnosis not present

## 2019-10-29 ENCOUNTER — Encounter: Payer: Self-pay | Admitting: Orthopedic Surgery

## 2019-10-29 NOTE — Progress Notes (Signed)
Office Visit Note   [name]           Date of Birth: [birth year]           MRN: [id number] Visit Date: 10/24/2019              Requested by: Vivi Barrack, MD [address] [place],  [place] [address] PCP: Vivi Barrack, MD  Chief Complaint  Patient presents with  . Right Foot - Follow-up  . Left Foot - Follow-up      HPI: Patient is a 74 year old woman who presents in follow-up for both feet.  She states she still has persistent ulceration on left foot she has been using Silvadene she is wearing knee-high compression stockings for the venous insufficiency.  She states she has had a small amount of bloody drainage from the plantar ulcer.  Patient is concerned it may be infected.  Assessment & Plan: Visit Diagnoses:  1. Charcot's joint, left ankle and foot   2. Ulcer of left foot, limited to breakdown of skin (HCC)   3. Venous insufficiency (chronic) (peripheral)     Plan: Ulcer was debrided of skin and soft tissue left foot there is no signs of infection.  No ulcers on the right foot.  Reevaluate the wound in 4 weeks  Follow-Up Instructions: Return in about 4 weeks (around 11/21/2019).   Ortho Exam  Patient is alert, oriented, no adenopathy, well-dressed, normal affect, normal respiratory effort. Examination patient has a stable Charcot collapse of the left foot there is a rocker-bottom deformity with an underlying Wagner grade 1 ulcer.  There is a Wagner grade 1 ulcer beneath the Charcot rocker-bottom deformity left foot.  The ulcer is 1 cm in diameter.  After informed consent a 10 blade knife was used to debride the skin and soft tissue back to healthy granulation tissue after debridement the ulcer is 2 cm in diameter 2 mm deep this was touched with silver nitrate for hemostasis.  There is good healthy granulation tissue no exposed bone or tendon no tunneling.  There is no ulceration on the right foot.  Imaging: No results found. No images are attached to  the encounter.  Labs: Lab Results  Component Value Date   HGBA1C 7.4 (A) 11/23/2018   HGBA1C 7.4 (A) 07/10/2018   HGBA1C 6.6 (A) 11/28/2017   ESRSEDRATE 36 (H) 02/24/2017   CRP 1.2 (H) 02/24/2017   LABURIC 5.5 09/01/2015   LABORGA GROUP B STREP (S.AGALACTIAE) ISOLATED 12/26/2014     Lab Results  Component Value Date   ALBUMIN 3.8 05/19/2017   ALBUMIN 3.6 04/21/2017   ALBUMIN 3.6 02/24/2017   PREALBUMIN 19.4 03/05/2012   LABURIC 5.5 09/01/2015    No results found for: MG No results found for: VD25OH  Lab Results  Component Value Date   PREALBUMIN 19.4 03/05/2012   CBC EXTENDED Latest Ref Rng & Units 09/25/2019 07/04/2019 02/22/2019  WBC 4.0 - 10.5 K/uL 10.7(H) 10.5 11.1(H)  RBC 3.87 - 5.11 Mil/uL 4.59 4.27 4.68  HGB 12.0 - 15.0 g/dL 12.4 11.8(L) 12.6  HCT 36 - 46 % 38.2 37.6 40.0  PLT 150 - 400 K/uL 391.0 375 327  NEUTROABS 1.4 - 7.7 K/uL 7.6 7.6 7.2  LYMPHSABS 0.7 - 4.0 K/uL 2.2 2.0 2.9     Body mass index is 37.94 kg/m.  Orders:  No orders of the defined types were placed in this encounter.  No orders of the defined types were placed in this encounter.  Procedures: No procedures performed  Clinical Data: No additional findings.  ROS:  All other systems negative, except as noted in the HPI. Review of Systems  Objective: Vital Signs: Ht 5\' 5"  (1.651 m)   Wt 228 lb (103.4 kg)   BMI 37.94 kg/m   Specialty Comments:  No specialty comments available.  PMFS History: Patient Active Problem List   Diagnosis Date Noted  . 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 Nuclear sclerotic cataract of both eyes 10/23/2019  . Posterior vitreous detachment of right eye 10/23/2019  . Retinal hemorrhage of left eye 10/23/2019  . Hyperlipidemia associated with type 2 diabetes mellitus ([place]) 11/30/2017  . Impingement syndrome of right shoulder 07/06/2017  . Iron deficiency anemia 05/11/2017  . Morbid  obesity ([place]) 04/24/2017  . Anemia 02/24/2017  . Baker's cyst 07/10/2016  . Onychomycosis 12/07/2015  . Upper airway cough syndrome 03/05/2014  . History of amputation of lesser toe of right foot ([place]) 04/15/2013  . Hypertension associated with diabetes ([place]) 12/05/2006  . Uncontrolled type 2 diabetes mellitus with peripheral circulatory disorder ([place]) 10/12/2006  . Dyslipidemia associated with type 2 diabetes mellitus ([place]) 10/12/2006  . Allergic rhinitis 10/12/2006  . GERD - Followed by Dr. Fuller Plan 10/12/2006  . IRRITABLE BOWEL SYNDROME - followed by Dr. Fuller Plan 10/12/2006  . Peripheral neuropathy 10/11/2006  . ALOPECIA NEC 10/11/2006   Past Medical History:  Diagnosis Date  . Alopecia   . Anemia, mild   . Arthritis   . Chronic cough    sees pulmonologist  . Chronic pain    chest wall and abd - s/p extensive eval  . Diabetes mellitus with neuropathy ([place])    sees endocrine  . Diverticulosis   . Fatty liver   . GERD (gastroesophageal reflux disease)    takes Nexium bid, hx erosive esophagitis  . Headache(784.0)    occasionally;r/t sinus   . History of colon polyps   . HTN (hypertension)   . Hx of amputation of lesser toe ([place])    sees podiatrist  . Hyperlipemia   . IBS (irritable bowel syndrome)   . Insomnia    takes Elavil nightly  . Joint pain   . Neuropathy   . Osteomyelitis ([place])   . Pneumonia   . PONV (postoperative nausea and vomiting)   . Seasonal allergies    takes Zyrtec daily  . Sinus tachycardia     Family History  Problem Relation Age of Onset  . Lung cancer Mother 31       smoked heavily  . Emphysema Mother   . Hypertension Father   . Hyperlipidemia Father   . Diabetes Father   . Coronary artery disease Father   . Dementia Father   . COPD Father        smoked  . Stomach cancer Paternal Aunt   . Brain cancer Paternal Uncle   . Irritable bowel syndrome Other        Several family members on fathers side   . Diabetes Other   . Stomach cancer  Maternal Aunt   . Lung cancer Paternal Uncle   . Heart disease Paternal Uncle   . Colon cancer Neg Hx     Past Surgical History:  Procedure Laterality Date  . AMPUTATION  12/28/2011   Procedure: AMPUTATION DIGIT;  Surgeon: Newt Minion, MD;  Location: [facility];  Service: Orthopedics;  Laterality: Right;  Right Foot 2nd Toe Amputation at MTP (metatarsophalangeal joint)  .  AMPUTATION Right 04/25/2012   Procedure: Right Foot 3rd Toe Amputation;  Surgeon: Newt Minion, MD;  Location: [facility];  Service: Orthopedics;  Laterality: Right;  Right Foot Third Toe Amputation   . AMPUTATION Right 07/27/2012   Procedure: Right 4th Toe Amputation at Metatarsophalangeal;  Surgeon: Newt Minion, MD;  Location: [facility];  Service: Orthopedics;  Laterality: Right;  Right 4th Toe Amputation at Metatarsophalangeal  . AMPUTATION Left 07/15/2016   Procedure: Left 2nd Ray Amputation;  Surgeon: Newt Minion, MD;  Location: [facility];  Service: Orthopedics;  Laterality: Left;  . AMPUTATION Left 11/18/2016   Procedure: Left 3rd and 4th Ray Amputation;  Surgeon: Newt Minion, MD;  Location: [facility];  Service: Orthopedics;  Laterality: Left;  . AMPUTATION Right 03/29/2017   Procedure: RIGHT FOOT 3RD AND 4TH RAY AMPUTATION;  Surgeon: Newt Minion, MD;  Location: [facility];  Service: Orthopedics;  Laterality: Right;  . COLONOSCOPY    . LAPAROSCOPIC APPENDECTOMY  01/05/2011   Procedure: APPENDECTOMY LAPAROSCOPIC;  Surgeon: Pedro Earls, MD;  [facility];  Service: General;  Laterality: N/A;  . OOPHORECTOMY  2001  . ROTATOR CUFF REPAIR Right    x 2  . TUBAL LIGATION    . VAGINAL HYSTERECTOMY  2001   Social History   Occupational History  . Occupation: Retired   Tobacco Use  . Smoking status: Never Smoker  . Smokeless tobacco: Never Used  Vaping Use  . Vaping Use: Never used  Substance and Sexual Activity  . Alcohol use: No  . Drug use: No  . Sexual activity: Yes    Birth control/protection: Surgical

## 2019-10-31 ENCOUNTER — Encounter (INDEPENDENT_AMBULATORY_CARE_PROVIDER_SITE_OTHER): Payer: Self-pay | Admitting: Ophthalmology

## 2019-10-31 ENCOUNTER — Other Ambulatory Visit: Payer: Self-pay

## 2019-10-31 ENCOUNTER — Ambulatory Visit (INDEPENDENT_AMBULATORY_CARE_PROVIDER_SITE_OTHER): Payer: Medicare Other | Admitting: Ophthalmology

## 2019-10-31 DIAGNOSIS — H2513 Age-related nuclear cataract, bilateral: Secondary | ICD-10-CM

## 2019-10-31 DIAGNOSIS — H43811 Vitreous degeneration, right eye: Secondary | ICD-10-CM

## 2019-10-31 DIAGNOSIS — H3562 Retinal hemorrhage, left eye: Secondary | ICD-10-CM | POA: Diagnosis not present

## 2019-10-31 DIAGNOSIS — E113293 Type 2 diabetes mellitus with mild nonproliferative diabetic retinopathy without macular edema, bilateral: Secondary | ICD-10-CM

## 2019-10-31 DIAGNOSIS — Z794 Long term (current) use of insulin: Secondary | ICD-10-CM

## 2019-10-31 NOTE — Assessment & Plan Note (Signed)
No signs of active maculopathy OU, only mild nonproliferative diabetic retinopathy seen.

## 2019-10-31 NOTE — Assessment & Plan Note (Addendum)
The nature of cataract was discussed with the patient as well as the elective nature of surgery. The patient was reassured that surgery at a later date does not put the patient at risk for a worse outcome. It was emphasized that the need for surgery is dictated by the patient's quality of life as influenced by the cataract. Patient was instructed to maintain close follow up with their general eye care doctor.  Up with Dr. Duanne Guess as scheduled

## 2019-10-31 NOTE — Progress Notes (Signed)
10/31/2019     CHIEF COMPLAINT Patient presents for Retina Follow Up   HISTORY OF PRESENT ILLNESS: Beth Holden is a 74 y.o. female who presents to the clinic today for:   HPI    Retina Follow Up    Patient presents with  Diabetic Retinopathy.  In both eyes.  Severity is moderate.  Duration of 1 year.  Since onset it is stable.  I, the attending physician,  performed the HPI with the patient and updated documentation appropriately.          Comments    1 Year Diabetic Exam. OCT  Pt c/o OU being dry and feeling like something is in them. Pt states she has episodes where parts of vision are clear and other parts are blurry. Pt states this lasts about 5 min and she experiences them every few months. BGL: 158 A1C: 7.1       Last edited by Tilda Franco on 10/31/2019  8:17 AM. (History)      Referring physician: Vivi Barrack, [address],  [place] [address]  HISTORICAL INFORMATION:   Selected notes from the MEDICAL RECORD NUMBER    Lab Results  Component Value Date   HGBA1C 7.4 (A) 11/23/2018     CURRENT MEDICATIONS: No current outpatient medications on file. (Ophthalmic Drugs)   No current facility-administered medications for this visit. (Ophthalmic Drugs)   Current Outpatient Medications (Other)  Medication Sig  . acetaminophen (TYLENOL) 500 MG tablet Take 500 mg by mouth every 8 (eight) hours as needed for mild pain.   Marland Kitchen betamethasone dipropionate (DIPROLENE) 0.05 % cream Apply 1 application topically 2 (two) times daily as needed (IRRITATION).  Marland Kitchen Blood Glucose Monitoring Suppl (ONE TOUCH ULTRA 2) w/Device KIT Use to check blood sugar  . cholecalciferol (VITAMIN D) 1000 units tablet Take 1 tablet by mouth daily.   Marland Kitchen dicyclomine (BENTYL) 10 MG capsule Take 1 capsule (10 mg total) by mouth 3 (three) times daily as needed for spasms.  Noelle Penner ALLERGY RELIEF, CETIRIZINE, 10 MG tablet Take 1 tablet by mouth once daily  . Fluocinolone Acetonide 0.01 %  OIL Use 1-2 drops daily as needed.  . gabapentin (NEURONTIN) 100 MG capsule TAKE 2-3 CAPSULES BY MOUTH AT BEDTIME  . glucagon (GLUCAGEN) 1 MG SOLR injection Inject 1 mg into the muscle once as needed for up to 1 dose for low blood sugar.  . hydrochlorothiazide (MICROZIDE) 12.5 MG capsule Take 1 capsule (12.5 mg total) by mouth daily.  . insulin NPH Human (NOVOLIN N RELION) 100 UNIT/ML injection Inject under skin 15 units in am and 10 units at bedtime (Patient taking differently: Inject under skin 12-15 units in am  and 10-15 units at bedtime)  . insulin regular (NOVOLIN R RELION) 100 units/mL injection INJECT 12 TO 30 UNITS SUBCUTANEOUSLY THREE TIMES DAILY BEFORE MEAL(S)  . Insulin Syringe-Needle U-100 (RELION INSULIN SYRINGE 1ML/31G) 31G X 5/16" 1 ML MISC USE 2 TIMES A DAY  . irbesartan (AVAPRO) 75 MG tablet Take 1 tablet (75 mg total) by mouth daily.  . irbesartan-hydrochlorothiazide (AVALIDE) 150-12.5 MG tablet Take 1 tablet by mouth daily.  . metFORMIN (GLUCOPHAGE) 1000 MG tablet TAKE 1 TABLET TWICE A DAY WITH MEALS  . metoprolol succinate (TOPROL-XL) 25 MG 24 hr tablet Take 1 tablet (25 mg total) by mouth daily.  Marland Kitchen nystatin cream (MYCOSTATIN) Apply to affected area 1-2 times daily  . omeprazole (PRILOSEC) 40 MG capsule Take 1 capsule (40 mg total)  by mouth 2 (two) times daily.  Glory Rosebush ULTRA test strip USE 1 STRIP TO CHECK GLUCOSE THREE TIMES DAILY  . OVER THE COUNTER MEDICATION Apply 1-3 application topically at bedtime. TOPRICIN FOOT CREAM - NEUROPATHY FOOT CREAM **APPLIES TO BOTH FEET AT BEDTIME**  . Probiotic Product (PROBIOTIC DAILY PO) Take 1 capsule by mouth daily.  . rosuvastatin (CRESTOR) 20 MG tablet Take 1 tablet (20 mg total) by mouth daily.  . silver sulfADIAZINE (SILVADENE) 1 % cream Apply 1 application topically daily.  . vitamin B-12 (CYANOCOBALAMIN) 1000 MCG tablet Take 1,000 mcg daily by mouth.   No current facility-administered medications for this visit. (Other)       REVIEW OF SYSTEMS:    ALLERGIES Allergies  Allergen Reactions  . Codeine Other (See Comments)    Makes her crazy  . Propoxyphene Hcl Itching    *DARVOCET     PAST MEDICAL HISTORY Past Medical History:  Diagnosis Date  . Alopecia   . Anemia, mild   . Arthritis   . Chronic cough    sees pulmonologist  . Chronic pain    chest wall and abd - s/p extensive eval  . Diabetes mellitus with neuropathy ([place])    sees endocrine  . Diverticulosis   . Fatty liver   . GERD (gastroesophageal reflux disease)    takes Nexium bid, hx erosive esophagitis  . Headache(784.0)    occasionally;r/t sinus   . History of colon polyps   . HTN (hypertension)   . Hx of amputation of lesser toe ([place])    sees podiatrist  . Hyperlipemia   . IBS (irritable bowel syndrome)   . Insomnia    takes Elavil nightly  . Joint pain   . Neuropathy   . Osteomyelitis ([place])   . Pneumonia   . PONV (postoperative nausea and vomiting)   . Seasonal allergies    takes Zyrtec daily  . Sinus tachycardia    Past Surgical History:  Procedure Laterality Date  . AMPUTATION  12/28/2011   Procedure: AMPUTATION DIGIT;  Surgeon: Newt Minion, MD;  Location: [facility];  Service: Orthopedics;  Laterality: Right;  Right Foot 2nd Toe Amputation at MTP (metatarsophalangeal joint)  . AMPUTATION Right 04/25/2012   Procedure: Right Foot 3rd Toe Amputation;  Surgeon: Newt Minion, MD;  Location: [facility];  Service: Orthopedics;  Laterality: Right;  Right Foot Third Toe Amputation   . AMPUTATION Right 07/27/2012   Procedure: Right 4th Toe Amputation at Metatarsophalangeal;  Surgeon: Newt Minion, MD;  Location: [facility];  Service: Orthopedics;  Laterality: Right;  Right 4th Toe Amputation at Metatarsophalangeal  . AMPUTATION Left 07/15/2016   Procedure: Left 2nd Ray Amputation;  Surgeon: Newt Minion, MD;  Location: [facility];  Service: Orthopedics;  Laterality: Left;  . AMPUTATION Left 11/18/2016   Procedure: Left 3rd and 4th Ray  Amputation;  Surgeon: Newt Minion, MD;  Location: [facility];  Service: Orthopedics;  Laterality: Left;  . AMPUTATION Right 03/29/2017   Procedure: RIGHT FOOT 3RD AND 4TH RAY AMPUTATION;  Surgeon: Newt Minion, MD;  Location: [facility];  Service: Orthopedics;  Laterality: Right;  . COLONOSCOPY    . LAPAROSCOPIC APPENDECTOMY  01/05/2011   Procedure: APPENDECTOMY LAPAROSCOPIC;  Surgeon: Pedro Earls, MD;  [facility];  Service: General;  Laterality: N/A;  . OOPHORECTOMY  2001  . ROTATOR CUFF REPAIR Right    x 2  . TUBAL LIGATION    . VAGINAL HYSTERECTOMY  2001  FAMILY HISTORY Family History  Problem Relation Age of Onset  . Lung cancer Mother 29       smoked heavily  . Emphysema Mother   . Hypertension Father   . Hyperlipidemia Father   . Diabetes Father   . Coronary artery disease Father   . Dementia Father   . COPD Father        smoked  . Stomach cancer Paternal Aunt   . Brain cancer Paternal Uncle   . Irritable bowel syndrome Other        Several family members on fathers side   . Diabetes Other   . Stomach cancer Maternal Aunt   . Lung cancer Paternal Uncle   . Heart disease Paternal Uncle   . Colon cancer Neg Hx     SOCIAL HISTORY Social History   Tobacco Use  . Smoking status: Never Smoker  . Smokeless tobacco: Never Used  Vaping Use  . Vaping Use: Never used  Substance Use Topics  . Alcohol use: No  . Drug use: No         OPHTHALMIC EXAM:  Base Eye Exam    Visual Acuity (Snellen - Linear)      Right Left   Dist [place] 20/25 -1 20/30 +       Tonometry (Tonopen, 8:22 AM)      Right Left   Pressure 18 20       Pupils      Pupils Dark Light Shape React APD   Right PERRL 3 2 Round Sluggish None   Left PERRL 3 2 Round Sluggish None       Visual Fields (Counting fingers)      Left Right    Full Full       Neuro/Psych    Oriented x3: Yes   Mood/Affect: Normal       Dilation    Both eyes: 1.0% Mydriacyl, 2.5% Phenylephrine @ 8:22 AM         Slit Lamp and Fundus Exam    External Exam      Right Left   External Normal Normal       Slit Lamp Exam      Right Left   Lids/Lashes Normal Normal   Conjunctiva/Sclera White and quiet White and quiet   Cornea Clear Clear   Anterior Chamber Deep and quiet Deep and quiet   Iris Round and reactive Round and reactive   Lens 2+ Nuclear sclerosis 2+ Nuclear sclerosis   Anterior Vitreous Normal Normal       Fundus Exam      Right Left   Posterior Vitreous Posterior vitreous detachment Posterior vitreous detachment   Disc Normal Normal   C/D Ratio 0.1 0.1   Macula Microaneurysms, no clinically significant macular edema Microaneurysms, no clinically significant macular edema   Vessels NPDR- Mild NPDR- Mild   Periphery Normal Normal          IMAGING AND PROCEDURES  Imaging and Procedures for 10/31/19  OCT, Retina - OU - Both Eyes       Right Eye Quality was good. Scan locations included subfoveal. Central Foveal Thickness: 210. Progression has been stable. Findings include normal foveal contour.   Left Eye Quality was good. Scan locations included subfoveal. Central Foveal Thickness: 216. Progression has been stable. Findings include normal foveal contour.   Notes General note of posterior vitreous detachment OU  No active maculopathy with mild nonproliferative diabetic retinopathy OU.  ASSESSMENT/PLAN:  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) No signs of active maculopathy OU, only mild nonproliferative diabetic retinopathy seen.  Nuclear sclerotic cataract of both eyes The nature of cataract was discussed with the patient as well as the elective nature of surgery. The patient was reassured that surgery at a later date does not put the patient at risk for a worse outcome. It was emphasized that the need for surgery is dictated by the patient's quality of life as influenced by the cataract. Patient was instructed to  maintain close follow up with their general eye care doctor.  Up with Dr. Duanne Guess as scheduled      ICD-10-CM   1. Mild nonproliferative diabetic retinopathy of both eyes without macular edema associated with type 2 diabetes mellitus ([place])  X50.5697 OCT, Retina - OU - Both Eyes  2. Nuclear sclerotic cataract of both eyes  H25.13   3. Posterior vitreous detachment of right eye  H43.811   4. Retinal hemorrhage of left eye  H35.62     1.  Importance of continued blood sugar control monitoring to slow the progression of diabetic retinopathy.  2.  Ophthalmology, Dr. Duanne Guess in January 2022 for follow-up of cataract  3.  Ophthalmic Meds Ordered this visit:  No orders of the defined types were placed in this encounter.      Return in about 2 years (around 10/30/2021) for DILATE OU, COLOR FP, OCT.  There are no Patient Instructions on file for this visit.   Explained the diagnoses, plan, and follow up with the patient and they expressed understanding.  Patient expressed understanding of the importance of proper follow up care.   Clent Demark Jullian Clayson M.D. Diseases & Surgery of the Retina and Vitreous [facility]ina [facility] Diabetic [place] 10/31/19     Abbreviations: M myopia (nearsighted); A astigmatism; H hyperopia (farsighted); P presbyopia; Mrx spectacle prescription;  CTL contact lenses; OD right eye; OS left eye; OU both eyes  XT exotropia; ET esotropia; PEK punctate epithelial keratitis; PEE punctate epithelial erosions; DES dry eye syndrome; MGD meibomian gland dysfunction; ATs artificial tears; PFAT's preservative free artificial tears; [place] nuclear sclerotic cataract; PSC posterior subcapsular cataract; ERM epi-retinal membrane; PVD posterior vitreous detachment; RD retinal detachment; DM diabetes mellitus; DR diabetic retinopathy; NPDR non-proliferative diabetic retinopathy; PDR proliferative diabetic retinopathy; CSME clinically significant macular edema; DME diabetic  macular edema; dbh dot blot hemorrhages; CWS cotton wool spot; POAG primary open angle glaucoma; C/D cup-to-disc ratio; HVF humphrey visual field; GVF goldmann visual field; OCT optical coherence tomography; IOP intraocular pressure; BRVO Branch retinal vein occlusion; CRVO central retinal vein occlusion; CRAO central retinal artery occlusion; BRAO branch retinal artery occlusion; RT retinal tear; SB scleral buckle; PPV pars plana vitrectomy; VH Vitreous hemorrhage; PRP panretinal laser photocoagulation; IVK intravitreal kenalog; VMT vitreomacular traction; MH Macular hole;  NVD neovascularization of the disc; NVE neovascularization elsewhere; AREDS age related eye disease study; ARMD age related macular degeneration; POAG primary open angle glaucoma; EBMD epithelial/anterior basement membrane dystrophy; ACIOL anterior chamber intraocular lens; IOL intraocular lens; PCIOL posterior chamber intraocular lens; Phaco/IOL phacoemulsification with intraocular lens placement; [place] photorefractive keratectomy; LASIK laser assisted in situ keratomileusis; HTN hypertension; DM diabetes mellitus; COPD chronic obstructive pulmonary disease

## 2019-11-04 ENCOUNTER — Encounter: Payer: Self-pay | Admitting: Family Medicine

## 2019-11-05 ENCOUNTER — Telehealth (INDEPENDENT_AMBULATORY_CARE_PROVIDER_SITE_OTHER): Payer: Medicare Other | Admitting: Physician Assistant

## 2019-11-05 ENCOUNTER — Other Ambulatory Visit: Payer: Medicare Other

## 2019-11-05 ENCOUNTER — Ambulatory Visit: Payer: Medicare Other

## 2019-11-05 ENCOUNTER — Other Ambulatory Visit: Payer: Self-pay

## 2019-11-05 ENCOUNTER — Other Ambulatory Visit: Payer: Self-pay | Admitting: *Deleted

## 2019-11-05 ENCOUNTER — Encounter: Payer: Self-pay | Admitting: Physician Assistant

## 2019-11-05 VITALS — Ht 65.0 in | Wt 229.0 lb

## 2019-11-05 DIAGNOSIS — Z20822 Contact with and (suspected) exposure to covid-19: Secondary | ICD-10-CM

## 2019-11-05 DIAGNOSIS — R0981 Nasal congestion: Secondary | ICD-10-CM | POA: Diagnosis not present

## 2019-11-05 MED ORDER — AMOXICILLIN-POT CLAVULANATE 875-125 MG PO TABS: 1.0000 | ORAL_TABLET | Freq: Two times a day (BID) | ORAL | 0 refills | Status: DC

## 2019-11-05 MED ORDER — FLUCONAZOLE 150 MG PO TABS: 150.0000 mg | ORAL_TABLET | Freq: Once | ORAL | 0 refills | Status: AC

## 2019-11-05 NOTE — Progress Notes (Signed)
Virtual Visit via Video   I connected with Shayne Deerman on 11/05/19 at 10:00 AM EDT by a video enabled telemedicine application and verified that I am speaking with the correct person using two identifiers. Location patient: Home Location provider: [facility], Office Persons participating in the virtual visit: Raychelle, Hudman PA-C,Donna Titus Dubin, LPN   I discussed the limitations of evaluation and management by telemedicine and the availability of in person appointments. The patient expressed understanding and agreed to proceed.  I acted as a Education administrator for [employer], PA-C [employer], LPN   Subjective:   HPI:    Sinus problem Pt c/o having sinus issues that have continued for the past 3 weeks. She complains of runny nose with clear drainage, has head pain that is hurting behind eyes. If she moves her head from side to side she gets pressure sensation and cough, but not all the time.  She does have some mucus slightly colored yellow. Also c/o left ear pain. Denies fever, loss of taste/smell. She is fully COVID vaccinated.   ROS: See pertinent positives and negatives per HPI.  Patient Active Problem List   Diagnosis Date Noted  . Mild nonproliferative diabetic retinopathy of both eyes ([place]) 10/23/2019  . Nuclear sclerotic cataract of both eyes 10/23/2019  . Posterior vitreous detachment of right eye 10/23/2019  . Retinal hemorrhage of left eye 10/23/2019  . Hyperlipidemia associated with type 2 diabetes mellitus ([place]) 11/30/2017  . Impingement syndrome of right shoulder 07/06/2017  . Iron deficiency anemia 05/11/2017  . Morbid obesity ([place]) 04/24/2017  . Anemia 02/24/2017  . Baker's cyst 07/10/2016  . Onychomycosis 12/07/2015  . Upper airway cough syndrome 03/05/2014  . History of amputation of lesser toe of right foot ([place]) 04/15/2013  . Hypertension associated with diabetes ([place]) 12/05/2006  . 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 Dyslipidemia associated with type 2 diabetes mellitus ([place]) 10/12/2006  . Allergic rhinitis 10/12/2006  . GERD - Followed by Dr. Fuller Plan 10/12/2006  . IRRITABLE BOWEL SYNDROME - followed by Dr. Fuller Plan 10/12/2006  . Peripheral neuropathy 10/11/2006  . ALOPECIA NEC 10/11/2006    Social History   Tobacco Use  . Smoking status: Never Smoker  . Smokeless tobacco: Never Used  Substance Use Topics  . Alcohol use: No    Current Outpatient Medications:  .  acetaminophen (TYLENOL) 500 MG tablet, Take 500 mg by mouth every 8 (eight) hours as needed for mild pain. , Disp: , Rfl:  .  betamethasone dipropionate (DIPROLENE) 0.05 % cream, Apply 1 application topically 2 (two) times daily as needed (IRRITATION)., Disp: 30 g, Rfl: 0 .  Blood Glucose Monitoring Suppl (ONE TOUCH ULTRA 2) w/Device KIT, Use to check blood sugar, Disp: 1 each, Rfl: 0 .  cholecalciferol (VITAMIN D) 1000 units tablet, Take 1 tablet by mouth daily. , Disp: , Rfl:  .  dicyclomine (BENTYL) 10 MG capsule, Take 1 capsule (10 mg total) by mouth 3 (three) times daily as needed for spasms., Disp: 90 capsule, Rfl: 5 .  EQ ALLERGY RELIEF, CETIRIZINE, 10 MG tablet, Take 1 tablet by mouth once daily, Disp: 90 tablet, Rfl: 0 .  Fluocinolone Acetonide 0.01 % OIL, Use 1-2 drops daily as needed., Disp: 20 mL, Rfl: 0 .  gabapentin (NEURONTIN) 100 MG capsule, TAKE 2-3 CAPSULES BY MOUTH AT BEDTIME, Disp: 180 capsule, Rfl: 3 .  glucagon (GLUCAGEN) 1 MG SOLR injection, Inject 1 mg into the muscle once  as needed for up to 1 dose for low blood sugar., Disp: 1 each, Rfl: 11 .  insulin NPH Human (NOVOLIN N RELION) 100 UNIT/ML injection, Inject under skin 15 units in am and 10 units at bedtime (Patient taking differently: Inject under skin 12-15 units in am  and 10-15 units at bedtime), Disp: 20 mL, Rfl: 3 .  insulin regular (NOVOLIN R RELION) 100 units/mL injection, INJECT 12 TO 30 UNITS SUBCUTANEOUSLY THREE TIMES  DAILY BEFORE MEAL(S), Disp: 80 mL, Rfl: 1 .  Insulin Syringe-Needle U-100 (RELION INSULIN SYRINGE 1ML/31G) 31G X 5/16" 1 ML MISC, USE 2 TIMES A DAY, Disp: 100 each, Rfl: 2 .  irbesartan-hydrochlorothiazide (AVALIDE) 150-12.5 MG tablet, Take 1 tablet by mouth daily., Disp: 90 tablet, Rfl: 1 .  metFORMIN (GLUCOPHAGE) 1000 MG tablet, TAKE 1 TABLET TWICE A DAY WITH MEALS, Disp: 180 tablet, Rfl: 3 .  metoprolol succinate (TOPROL-XL) 25 MG 24 hr tablet, Take 1 tablet (25 mg total) by mouth daily., Disp: 90 tablet, Rfl: 2 .  omeprazole (PRILOSEC) 40 MG capsule, Take 1 capsule (40 mg total) by mouth 2 (two) times daily., Disp: 180 capsule, Rfl: 3 .  ONETOUCH ULTRA test strip, USE 1 STRIP TO CHECK GLUCOSE THREE TIMES DAILY, Disp: 300 each, Rfl: 11 .  OVER THE COUNTER MEDICATION, Apply 1-3 application topically at bedtime. TOPRICIN FOOT CREAM - NEUROPATHY FOOT CREAM **APPLIES TO BOTH FEET AT BEDTIME**, Disp: , Rfl:  .  Probiotic Product (PROBIOTIC DAILY PO), Take 1 capsule by mouth daily., Disp: , Rfl:  .  rosuvastatin (CRESTOR) 20 MG tablet, Take 1 tablet (20 mg total) by mouth daily., Disp: 90 tablet, Rfl: 3 .  silver sulfADIAZINE (SILVADENE) 1 % cream, Apply 1 application topically daily., Disp: 50 g, Rfl: 1 .  vitamin B-12 (CYANOCOBALAMIN) 1000 MCG tablet, Take 1,000 mcg daily by mouth., Disp: , Rfl:  .  amoxicillin-clavulanate (AUGMENTIN) 875-125 MG tablet, Take 1 tablet by mouth 2 (two) times daily., Disp: 20 tablet, Rfl: 0 .  fluconazole (DIFLUCAN) 150 MG tablet, Take 1 tablet (150 mg total) by mouth once for 1 dose., Disp: 1 tablet, Rfl: 0  Allergies  Allergen Reactions  . Codeine Other (See Comments)    Makes her crazy  . Propoxyphene Hcl Itching    *DARVOCET     Objective:   VITALS: Per patient if applicable, see vitals. GENERAL: Alert, appears well and in no acute distress. HEENT: Atraumatic, conjunctiva clear, no obvious abnormalities on inspection of external nose and ears. NECK:  Normal movements of the head and neck. CARDIOPULMONARY: No increased WOB. Speaking in clear sentences. I:E ratio WNL.  MS: Moves all visible extremities without noticeable abnormality. PSYCH: Pleasant and cooperative, well-groomed. Speech normal rate and rhythm. Affect is appropriate. Insight and judgement are appropriate. Attention is focused, linear, and appropriate.  NEURO: CN grossly intact. Oriented as arrived to appointment on time with no prompting. Moves both UE equally.  SKIN: No obvious lesions, wounds, erythema, or cyanosis noted on face or hands.  Assessment and Plan:   Garnett was seen today for sinus problem.  Diagnoses and all orders for this visit:  Sinus congestion Patient has a respiratory illness without signs of acute distress or respiratory compromise at this time. Will treat her sinusitis symptoms with abx given the chronicity of symptoms >3 weeks, will treat with oral augmentin. Additionally patient has requested one time oral diflucan to be provided for possible abx-induced yeast infection. She was encouraged to switch out her antihistamine, recommend that  she trial xyzal instead of zyrtec to see if she can get some improvement in her itchy ears and other allergy sx.  She was also advised to get COVID testing.  As a precaution, they have been advised to remain home until COVID-19 results and then possible further quarantine after that based on results and symptoms. Advised if they experience a "second sickening" or worsening symptoms as the illness progresses, they are to call the office for further instructions or seek emergent evaluation for any severe symptoms.    Other orders -     amoxicillin-clavulanate (AUGMENTIN) 875-125 MG tablet; Take 1 tablet by mouth 2 (two) times daily. -     fluconazole (DIFLUCAN) 150 MG tablet; Take 1 tablet (150 mg total) by mouth once for 1 dose.    I discussed the assessment and treatment plan with the patient. The patient was  provided an opportunity to ask questions and all were answered. The patient agreed with the plan and demonstrated an understanding of the instructions.   The patient was advised to call back or seek an in-person evaluation if the symptoms worsen or if the condition fails to improve as anticipated.   CMA or LPN served as scribe during this visit. History, Physical, and Plan performed by medical provider. The above documentation has been reviewed and is accurate and complete.   [place], [place] 11/05/2019

## 2019-11-06 LAB — SARS-COV-2, NAA 2 DAY TAT

## 2019-11-06 LAB — NOVEL CORONAVIRUS, NAA: SARS-CoV-2, NAA: NOT DETECTED

## 2019-11-07 ENCOUNTER — Other Ambulatory Visit: Payer: Medicare Other

## 2019-11-18 ENCOUNTER — Telehealth: Payer: Self-pay

## 2019-11-18 NOTE — Telephone Encounter (Signed)
Recommend waiting a week or two for the booster. She should schedule OV if still not feeling better.  Algis Greenhouse. Jerline Pain, MD 11/18/2019 12:58 PM

## 2019-11-18 NOTE — Telephone Encounter (Signed)
Pt notified to waite a week or two for booster  Schedule F/U appointment with PCP if no changes  Pt upset, stated was not sure why she need an appointment if symptoms did not change.

## 2019-11-18 NOTE — Telephone Encounter (Signed)
Please advise 

## 2019-11-18 NOTE — Telephone Encounter (Signed)
Pt states that she finished the antibiotics that [facility] prescribed for a sinus infection. She states she still is coughing a lot and has a headache, feels like she can't breath well. Pt is supposed to get her covid booster this morning at 9:40 am. Should she still go? Pt also wants to know if she should come in and get seen, since she has not improved.

## 2019-11-21 ENCOUNTER — Encounter: Payer: Self-pay | Admitting: Orthopedic Surgery

## 2019-11-21 ENCOUNTER — Other Ambulatory Visit: Payer: Self-pay

## 2019-11-21 ENCOUNTER — Encounter: Payer: Self-pay | Admitting: Family Medicine

## 2019-11-21 ENCOUNTER — Ambulatory Visit (INDEPENDENT_AMBULATORY_CARE_PROVIDER_SITE_OTHER): Payer: Medicare Other | Admitting: Orthopedic Surgery

## 2019-11-21 ENCOUNTER — Ambulatory Visit (INDEPENDENT_AMBULATORY_CARE_PROVIDER_SITE_OTHER): Payer: Medicare Other | Admitting: Family Medicine

## 2019-11-21 VITALS — Ht 65.0 in | Wt 229.0 lb

## 2019-11-21 VITALS — BP 116/71 | HR 70 | Temp 98.1°F | Ht 65.0 in | Wt 227.8 lb

## 2019-11-21 DIAGNOSIS — M14672 Charcot's joint, left ankle and foot: Secondary | ICD-10-CM | POA: Diagnosis not present

## 2019-11-21 DIAGNOSIS — E1165 Type 2 diabetes mellitus with hyperglycemia: Secondary | ICD-10-CM | POA: Diagnosis not present

## 2019-11-21 DIAGNOSIS — IMO0002 Reserved for concepts with insufficient information to code with codable children: Secondary | ICD-10-CM

## 2019-11-21 DIAGNOSIS — R413 Other amnesia: Secondary | ICD-10-CM

## 2019-11-21 DIAGNOSIS — L97521 Non-pressure chronic ulcer of other part of left foot limited to breakdown of skin: Secondary | ICD-10-CM

## 2019-11-21 DIAGNOSIS — B351 Tinea unguium: Secondary | ICD-10-CM

## 2019-11-21 DIAGNOSIS — E1151 Type 2 diabetes mellitus with diabetic peripheral angiopathy without gangrene: Secondary | ICD-10-CM

## 2019-11-21 MED ORDER — AZITHROMYCIN 250 MG PO TABS: ORAL_TABLET | ORAL | 0 refills | Status: DC

## 2019-11-21 MED ORDER — PROMETHAZINE-DM 6.25-15 MG/5ML PO SYRP: 2.5000 mL | ORAL_SOLUTION | Freq: Four times a day (QID) | ORAL | 0 refills | Status: DC | PRN

## 2019-11-21 MED ORDER — AZELASTINE HCL 0.1 % NA SOLN: 2.0000 | Freq: Two times a day (BID) | NASAL | 12 refills | Status: DC

## 2019-11-21 NOTE — Assessment & Plan Note (Signed)
No red flags.  Discussed referral to neurology however patient declined for now.

## 2019-11-21 NOTE — Progress Notes (Signed)
Office Visit Note   [name]           Date of Birth: 01/10/1946           MRN: [id number] Visit Date: 11/21/2019              Requested by: Vivi Barrack, MD [address] [place],   [id number] PCP: Vivi Barrack, MD  Chief Complaint  Patient presents with  . Right Foot - Follow-up  . Left Foot - Follow-up      HPI: Patient is a 74 year old woman who presents in follow-up Charcot collapse left foot with chronic Wagner grade 1 ulcer beneath the Charcot rocker-bottom deformity in the left she also has thickened discolored onychomycotic nails x4 which she is unable to safely trim on her own she does have some venous stasis swelling.  Assessment & Plan: Visit Diagnoses:  1. Charcot's joint, left ankle and foot   2. Ulcer of left foot, limited to breakdown of skin ([place])   3. Onychomycosis     Plan: Recommended she wear her knee-high compression stockings every day she states this is difficult for her to get on.  Recommend that she use her extra-depth shoes with multidensity custom orthotics.  Follow-Up Instructions: Return in about 4 weeks (around 12/19/2019).   Ortho Exam  Patient is alert, oriented, no adenopathy, well-dressed, normal affect, normal respiratory effort. Examination patient has good pulses she has a stable Charcot rocker-bottom deformity in the left she has callus and ulceration beneath the rocker-bottom deformity.  There is no redness no cellulitis no odor or drainage.  After informed consent a 10 blade knife was used to debride the skin and soft tissue back to healthy viable granulation tissue this was touched with silver nitrate.  Predebridement the ulcer is 10 mm in diameter after debridement the ulcer is 3 cm meters in diameter and 3 mm deep there is no exposed bone or tendon patient has thickened discolored onychomycotic nails x4 which were trimmed without problems.  Imaging: No results found. No images are attached to the  encounter.  Labs: Lab Results  Component Value Date   HGBA1C 7.4 (A) 11/23/2018   HGBA1C 7.4 (A) 07/10/2018   HGBA1C 6.6 (A) 11/28/2017   ESRSEDRATE 36 (H) 02/24/2017   CRP 1.2 (H) 02/24/2017   LABURIC 5.5 09/01/2015   LABORGA GROUP B STREP (S.AGALACTIAE) ISOLATED 12/26/2014     Lab Results  Component Value Date   ALBUMIN 3.8 05/19/2017   ALBUMIN 3.6 04/21/2017   ALBUMIN 3.6 02/24/2017   PREALBUMIN 19.4 03/05/2012   LABURIC 5.5 09/01/2015    No results found for: MG No results found for: VD25OH  Lab Results  Component Value Date   PREALBUMIN 19.4 03/05/2012   CBC EXTENDED Latest Ref Rng & Units 09/25/2019 07/04/2019 02/22/2019  WBC 4.0 - 10.5 K/uL 10.7(H) 10.5 11.1(H)  RBC 3.87 - 5.11 Mil/uL 4.59 4.27 4.68  HGB 12.0 - 15.0 g/dL 12.4 11.8(L) 12.6  HCT 36 - 46 % 38.2 37.6 40.0  PLT 150 - 400 K/uL 391.0 375 327  NEUTROABS 1.4 - 7.7 K/uL 7.6 7.6 7.2  LYMPHSABS 0.7 - 4.0 K/uL 2.2 2.0 2.9     Body mass index is 38.11 kg/m.  Orders:  No orders of the defined types were placed in this encounter.  No orders of the defined types were placed in this encounter.    Procedures: No procedures performed  Clinical Data: No additional findings.  ROS:  All other  systems negative, except as noted in the HPI. Review of Systems  Objective: Vital Signs: Ht 5\' 5"  (1.651 m)   Wt 229 lb (103.9 kg)   BMI 38.11 kg/m   Specialty Comments:  No specialty comments available.  PMFS History: Patient Active Problem List   Diagnosis Date Noted  . Mild nonproliferative diabetic retinopathy of both eyes ([place]) 10/23/2019  . Nuclear sclerotic cataract of both eyes 10/23/2019  . Posterior vitreous detachment of right eye 10/23/2019  . Retinal hemorrhage of left eye 10/23/2019  . Hyperlipidemia associated with type 2 diabetes mellitus ([place]) 11/30/2017  . Impingement syndrome of right shoulder 07/06/2017  . Iron deficiency anemia 05/11/2017  . Morbid obesity ([place]) 04/24/2017  .  Anemia 02/24/2017  . Baker's cyst 07/10/2016  . Onychomycosis 12/07/2015  . Upper airway cough syndrome 03/05/2014  . History of amputation of lesser toe of right foot ([place]) 04/15/2013  . Hypertension associated with diabetes ([place]) 12/05/2006  . Uncontrolled type 2 diabetes mellitus with peripheral circulatory disorder ([place]) 10/12/2006  . Dyslipidemia associated with type 2 diabetes mellitus ([place]) 10/12/2006  . Allergic rhinitis 10/12/2006  . GERD - Followed by Dr. Fuller Plan 10/12/2006  . IRRITABLE BOWEL SYNDROME - followed by Dr. Fuller Plan 10/12/2006  . Peripheral neuropathy 10/11/2006  . ALOPECIA NEC 10/11/2006   Past Medical History:  Diagnosis Date  . Alopecia   . Anemia, mild   . Arthritis   . Chronic cough    sees pulmonologist  . Chronic pain    chest wall and abd - s/p extensive eval  . Diabetes mellitus with neuropathy ([place])    sees endocrine  . Diverticulosis   . Fatty liver   . GERD (gastroesophageal reflux disease)    takes Nexium bid, hx erosive esophagitis  . Headache(784.0)    occasionally;r/t sinus   . History of colon polyps   . HTN (hypertension)   . Hx of amputation of lesser toe ([place])    sees podiatrist  . Hyperlipemia   . IBS (irritable bowel syndrome)   . Insomnia    takes Elavil nightly  . Joint pain   . Neuropathy   . Osteomyelitis ([place])   . Pneumonia   . PONV (postoperative nausea and vomiting)   . Seasonal allergies    takes Zyrtec daily  . Sinus tachycardia     Family History  Problem Relation Age of Onset  . Lung cancer Mother 47       smoked heavily  . Emphysema Mother   . Hypertension Father   . Hyperlipidemia Father   . Diabetes Father   . Coronary artery disease Father   . Dementia Father   . COPD Father        smoked  . Stomach cancer Paternal Aunt   . Brain cancer Paternal Uncle   . Irritable bowel syndrome Other        Several family members on fathers side   . Diabetes Other   . Stomach cancer Maternal Aunt   . Lung cancer  Paternal Uncle   . Heart disease Paternal Uncle   . Colon cancer Neg Hx     Past Surgical History:  Procedure Laterality Date  . AMPUTATION  12/28/2011   Procedure: AMPUTATION DIGIT;  Surgeon: Newt Minion, MD;  Location: [facility];  Service: Orthopedics;  Laterality: Right;  Right Foot 2nd Toe Amputation at MTP (metatarsophalangeal joint)  . AMPUTATION Right 04/25/2012   Procedure: Right Foot 3rd Toe Amputation;  Surgeon: Newt Minion,  MD;  Location: [facility];  Service: Orthopedics;  Laterality: Right;  Right Foot Third Toe Amputation   . AMPUTATION Right 07/27/2012   Procedure: Right 4th Toe Amputation at Metatarsophalangeal;  Surgeon: Newt Minion, MD;  Location: [facility];  Service: Orthopedics;  Laterality: Right;  Right 4th Toe Amputation at Metatarsophalangeal  . AMPUTATION Left 07/15/2016   Procedure: Left 2nd Ray Amputation;  Surgeon: Newt Minion, MD;  Location: [facility];  Service: Orthopedics;  Laterality: Left;  . AMPUTATION Left 11/18/2016   Procedure: Left 3rd and 4th Ray Amputation;  Surgeon: Newt Minion, MD;  Location: [facility];  Service: Orthopedics;  Laterality: Left;  . AMPUTATION Right 03/29/2017   Procedure: RIGHT FOOT 3RD AND 4TH RAY AMPUTATION;  Surgeon: Newt Minion, MD;  Location: [facility];  Service: Orthopedics;  Laterality: Right;  . COLONOSCOPY    . LAPAROSCOPIC APPENDECTOMY  01/05/2011   Procedure: APPENDECTOMY LAPAROSCOPIC;  Surgeon: Pedro Earls, MD;  Location: [facility];  Service: General;  Laterality: N/A;  . OOPHORECTOMY  2001  . ROTATOR CUFF REPAIR Right    x 2  . TUBAL LIGATION    . VAGINAL HYSTERECTOMY  2001   Social History   Occupational History  . Occupation: Retired   Tobacco Use  . Smoking status: Never Smoker  . Smokeless tobacco: Never Used  Vaping Use  . Vaping Use: Never used  Substance and Sexual Activity  . Alcohol use: No  . Drug use: No  . Sexual activity: Yes    Birth control/protection: Surgical

## 2019-11-21 NOTE — Patient Instructions (Signed)
It was very nice to see you today!  Please start the Z-Pak and nasal spray.  I will also send in cough medication.  Let me know if not improving by next week.  Let me know if you would like a referral to see a neurologist.  Take care, Dr Jerline Pain  Please try these tips to maintain a healthy lifestyle:   Eat at least 3 REAL meals and 1-2 snacks per day.  Aim for no more than 5 hours between eating.  If you eat breakfast, please do so within one hour of getting up.    Each meal should contain half fruits/vegetables, one quarter protein, and one quarter carbs (no bigger than a computer mouse)   Cut down on sweet beverages. This includes juice, soda, and sweet tea.     Drink at least 1 glass of water with each meal and aim for at least 8 glasses per day   Exercise at least 150 minutes every week.

## 2019-11-21 NOTE — Progress Notes (Signed)
   Beth Holden is a 74 y.o. female who presents today for an office visit.  Assessment/Plan:  New/Acute Problems: Sinusitis We will start Astelin and Z-Pak.  Also send in promethazine-dextromethorphan cough syrup.  Will avoid prednisone due to diabetes.  If no improvement will consider imaging or referral to ENT.  Chronic Problems Addressed Today: Memory loss No red flags.  Discussed referral to neurology however patient declined for now.  T2dm Follows with endocrinology.  Will avoid prednisone due to poor glycemic control.    Subjective:  HPI:  PAtient here for follow up. Was seen about 2 weeks ago virtually by another provider.  Given Augmentin for sinus infection.  Symptoms did not improve.  Still has quite a bit of congestion.  No fevers or chills.  Subsequent facial pressure and headache.  She is also concerned about memory loss.  She is occasionally forgetting to take her medications.  Father suffers with dementia and she is worried she may be dealing with the same.       Objective:  Physical Exam: BP 116/71   Pulse 70   Temp 98.1 F (36.7 C) (Temporal)   Ht 5\' 5"  (1.651 m)   Wt 227 lb 12.8 oz (103.3 kg)   SpO2 95%   BMI 37.91 kg/m   Gen: No acute distress, resting comfortably CV: Regular rate and rhythm with no murmurs appreciated Pulm: Normal work of breathing, clear to auscultation bilaterally with no crackles, wheezes, or rhonchi Neuro: Grossly normal, moves all extremities Psych: Normal affect and thought content      Janoah Menna M. Jerline Pain, MD 11/21/2019 12:15 PM

## 2019-11-28 ENCOUNTER — Encounter: Payer: Self-pay | Admitting: Family Medicine

## 2019-12-11 ENCOUNTER — Ambulatory Visit (INDEPENDENT_AMBULATORY_CARE_PROVIDER_SITE_OTHER): Payer: Medicare Other | Admitting: Family Medicine

## 2019-12-11 ENCOUNTER — Encounter: Payer: Self-pay | Admitting: Family Medicine

## 2019-12-11 ENCOUNTER — Other Ambulatory Visit: Payer: Self-pay

## 2019-12-11 VITALS — BP 130/68 | HR 83 | Temp 97.9°F | Wt 225.6 lb

## 2019-12-11 DIAGNOSIS — M47812 Spondylosis without myelopathy or radiculopathy, cervical region: Secondary | ICD-10-CM | POA: Diagnosis not present

## 2019-12-11 DIAGNOSIS — M542 Cervicalgia: Secondary | ICD-10-CM

## 2019-12-11 DIAGNOSIS — M62838 Other muscle spasm: Secondary | ICD-10-CM | POA: Diagnosis not present

## 2019-12-11 MED ORDER — CYCLOBENZAPRINE HCL 10 MG PO TABS: 10.0000 mg | ORAL_TABLET | Freq: Every evening | ORAL | 0 refills | Status: DC | PRN

## 2019-12-11 MED ORDER — KETOROLAC TROMETHAMINE 60 MG/2ML IM SOLN
60.0000 mg | Freq: Once | INTRAMUSCULAR | Status: AC
  Administered 2019-12-11: 60 mg via INTRAMUSCULAR

## 2019-12-11 MED ORDER — TRAMADOL HCL 50 MG PO TABS
50.0000 mg | ORAL_TABLET | Freq: Three times a day (TID) | ORAL | 0 refills | Status: AC | PRN
Start: 2019-12-11 — End: 2019-12-16

## 2019-12-11 NOTE — Patient Instructions (Signed)
Please return in 1-2 weeks to see Dr. Jerline Pain to reevaluate your neck pain and see if further work up is needed.   Try the muscle relaxer at night.  You may use the tramadol as needed for pain.  Most people with codeine allergies can tolerate the tramadol.  Heating pads to the neck may help calm down the muscle spasm.  A soft cervical collar could be helpful as well.   If you have any questions or concerns, please don't hesitate to send me a message via MyChart or call the office at [phone]. Thank you for visiting with [place] today! It's our pleasure caring for you.  Please go to our [facility] office to get your xrays done. You can walk in M-F between 8:30am- noon or 1pm - 5pm. Tell them you are there for xrays ordered by me. They will send me the results, then I will let you know the results with instructions.   Address: [address].  The Xray department is located in the basement.

## 2019-12-11 NOTE — Progress Notes (Signed)
 Subjective  CC:  Chief Complaint  Patient presents with  . Neck Pain    hx of rotator cuff surgery, pain started right shoulder now going into her neck   Same day acute visit; PCP not available. New pt to me. Chart reviewed.   HPI: Beth Holden is a 74 y.o. female who presents to the office today to address the problems listed above in the chief complaint.  74-year-old female presents due to a 1 week history of worsening neck pain.  She reports a chronic intermittent history of shoulder pain over the last several years however this is completely different.  Pain started at the right lower neck and over the last week has progressed to bilateral neck pain radiating up to the back of her head.  She has neck stiffness and sharp shooting pains with movement or coughing.  She denies pain down the upper extremities.  There is no upper extremity weakness.  She denies trauma, fever, chills, headaches, myalgias or malaise.  Her neck stiffness has been present for several days but is slightly better today.  She has been sleeping propped up on pillows but pain is kept her awake.  She is using Tylenol with little relief.  She has never had pain like this before.  Chart review shows x-rays from 2014 showing moderate DJD of the cervical spine.   Assessment  1. Spondylosis of cervical region without myelopathy or radiculopathy   2. Neck muscle spasm   3. Neck pain      Plan   Neck pain due to DJD of the cervical spine and muscle spasm: Toradol injection 60 mg given in the office with mild improvement in symptoms.  Exam consistent with musculoskeletal pain as she is tender over bilateral traps left greater than right.  Will try tramadol and Flexeril.  Careful instructions and education on proper use given.  Monitor for side effects.  If symptoms are worsening, develops fever would recommend emergency evaluation.  At this time no symptoms consistent with meningitis.  If not improving, will need further  work-up ordered for torticollis.  Recommend checking next x-rays when she is feeling a bit better.  Orders given.  Recommend follow-up in 1 to 2 weeks with her PCP  Follow up: Recheck with Dr. Parker in 1 to 2 weeks Visit date not found  Orders Placed This Encounter  Procedures  . DG Cervical Spine Complete   Meds ordered this encounter  Medications  . cyclobenzaprine (FLEXERIL) 10 MG tablet    Sig: Take 1 tablet (10 mg total) by mouth at bedtime as needed for muscle spasms.    Dispense:  30 tablet    Refill:  0  . traMADol (ULTRAM) 50 MG tablet    Sig: Take 1 tablet (50 mg total) by mouth every 8 (eight) hours as needed for up to 5 days.    Dispense:  15 tablet    Refill:  0  . ketorolac (TORADOL) injection 60 mg      I reviewed the patients updated PMH, FH, and SocHx.    Patient Active Problem List   Diagnosis Date Noted  . Memory loss 11/21/2019  . 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) 10/23/2019  . Nuclear sclerotic cataract of both eyes 10/23/2019  . Posterior vitreous detachment of right eye 10/23/2019  . Retinal hemorrhage of left eye 10/23/2019  . Hyperlipidemia associated with type 2 diabetes mellitus (HCC) 11/30/2017  . Impingement syndrome of right shoulder 07/06/2017  .   Iron deficiency anemia 05/11/2017  . Morbid obesity (HCC) 04/24/2017  . Anemia 02/24/2017  . Baker's cyst 07/10/2016  . Onychomycosis 12/07/2015  . Upper airway cough syndrome 03/05/2014  . History of amputation of lesser toe of right foot (HCC) 04/15/2013  . Hypertension associated with diabetes (HCC) 12/05/2006  . Uncontrolled type 2 diabetes mellitus with peripheral circulatory disorder (HCC) 10/12/2006  . Dyslipidemia associated with type 2 diabetes mellitus (HCC) 10/12/2006  . Allergic rhinitis 10/12/2006  . GERD - Followed by Dr. Stark 10/12/2006  . IRRITABLE BOWEL SYNDROME - followed by Dr. Stark 10/12/2006  . Peripheral neuropathy 10/11/2006  . ALOPECIA NEC  10/11/2006   Current Meds  Medication Sig  . acetaminophen (TYLENOL) 500 MG tablet Take 500 mg by mouth every 8 (eight) hours as needed for mild pain.   . azelastine (ASTELIN) 0.1 % nasal spray Place 2 sprays into both nostrils 2 (two) times daily.  . betamethasone dipropionate (DIPROLENE) 0.05 % cream Apply 1 application topically 2 (two) times daily as needed (IRRITATION).  . Blood Glucose Monitoring Suppl (ONE TOUCH ULTRA 2) w/Device KIT Use to check blood sugar  . cholecalciferol (VITAMIN D) 1000 units tablet Take 1 tablet by mouth daily.   . dicyclomine (BENTYL) 10 MG capsule Take 1 capsule (10 mg total) by mouth 3 (three) times daily as needed for spasms.  . EQ ALLERGY RELIEF, CETIRIZINE, 10 MG tablet Take 1 tablet by mouth once daily  . Fluocinolone Acetonide 0.01 % OIL Use 1-2 drops daily as needed.  . gabapentin (NEURONTIN) 100 MG capsule TAKE 2-3 CAPSULES BY MOUTH AT BEDTIME  . glucagon (GLUCAGEN) 1 MG SOLR injection Inject 1 mg into the muscle once as needed for up to 1 dose for low blood sugar.  . insulin NPH Human (NOVOLIN N RELION) 100 UNIT/ML injection Inject under skin 15 units in am and 10 units at bedtime (Patient taking differently: Inject under skin 12-15 units in am  and 10-15 units at bedtime)  . insulin regular (NOVOLIN R RELION) 100 units/mL injection INJECT 12 TO 30 UNITS SUBCUTANEOUSLY THREE TIMES DAILY BEFORE MEAL(S)  . Insulin Syringe-Needle U-100 (RELION INSULIN SYRINGE 1ML/31G) 31G X 5/16" 1 ML MISC USE 2 TIMES A DAY  . irbesartan-hydrochlorothiazide (AVALIDE) 150-12.5 MG tablet Take 1 tablet by mouth daily.  . metFORMIN (GLUCOPHAGE) 1000 MG tablet TAKE 1 TABLET TWICE A DAY WITH MEALS  . metoprolol succinate (TOPROL-XL) 25 MG 24 hr tablet Take 1 tablet (25 mg total) by mouth daily.  . omeprazole (PRILOSEC) 40 MG capsule Take 1 capsule (40 mg total) by mouth 2 (two) times daily.  . ONETOUCH ULTRA test strip USE 1 STRIP TO CHECK GLUCOSE THREE TIMES DAILY  . OVER THE  COUNTER MEDICATION Apply 1-3 application topically at bedtime. TOPRICIN FOOT CREAM - NEUROPATHY FOOT CREAM **APPLIES TO BOTH FEET AT BEDTIME**  . Probiotic Product (PROBIOTIC DAILY PO) Take 1 capsule by mouth daily.  . promethazine-dextromethorphan (PROMETHAZINE-DM) 6.25-15 MG/5ML syrup Take 2.5 mLs by mouth 4 (four) times daily as needed for cough.  . rosuvastatin (CRESTOR) 20 MG tablet Take 1 tablet (20 mg total) by mouth daily.  . silver sulfADIAZINE (SILVADENE) 1 % cream Apply 1 application topically daily.  . vitamin B-12 (CYANOCOBALAMIN) 1000 MCG tablet Take 1,000 mcg daily by mouth.    Allergies: Patient is allergic to codeine and propoxyphene hcl. Family History: Patient family history includes Brain cancer in her paternal uncle; COPD in her father; Coronary artery disease in her father; Dementia in   her father; Diabetes in her father and another family member; Emphysema in her mother; Heart disease in her paternal uncle; Hyperlipidemia in her father; Hypertension in her father; Irritable bowel syndrome in an other family member; Lung cancer in her paternal uncle; Lung cancer (age of onset: 34) in her mother; Stomach cancer in her maternal aunt and paternal aunt. Social History:  Patient  reports that she has never smoked. She has never used smokeless tobacco. She reports that she does not drink alcohol and does not use drugs.  Review of Systems: Constitutional: Negative for fever malaise or anorexia Cardiovascular: negative for chest pain Respiratory: negative for SOB or persistent cough Gastrointestinal: negative for abdominal pain  Objective  Vitals: BP 130/68   Pulse 83   Temp 97.9 F (36.6 C) (Temporal)   Wt 225 lb 9.6 oz (102.3 kg)   SpO2 98%   BMI 37.54 kg/m  General: Appears uncomfortable, mildly.  Stiff neck, A&Ox3 Neck: Decreased flexion, extension and rotation of neck due to pain.  Tender left trap greater than right trap with spasm, left lateral cervical spine  tenderness with no step-off. Bilateral shoulders with full range of motion Cardiovascular:  RRR without murmur or gallop.  Respiratory:  Good breath sounds bilaterally, CTAB with normal respiratory effort Skin:  Warm, no rashes  Toradol 60 mg IM given in the office.  Minimal to mild pain relief 10 to 15 minutes afterwards.  No adverse reaction.   Commons side effects, risks, benefits, and alternatives for medications and treatment plan prescribed today were discussed, and the patient expressed understanding of the given instructions. Patient is instructed to call or message via MyChart if he/she has any questions or concerns regarding our treatment plan. No barriers to understanding were identified. We discussed Red Flag symptoms and signs in detail. Patient expressed understanding regarding what to do in case of urgent or emergency type symptoms.   Medication list was reconciled, printed and provided to the patient in AVS. Patient instructions and summary information was reviewed with the patient as documented in the AVS. This note was prepared with assistance of Dragon voice recognition software. Occasional wrong-word or sound-a-like substitutions may have occurred due to the inherent limitations of voice recognition software  This visit occurred during the SARS-CoV-2 public health emergency.  Safety protocols were in place, including screening questions prior to the visit, additional usage of staff PPE, and extensive cleaning of exam room while observing appropriate contact time as indicated for disinfecting solutions.

## 2019-12-12 ENCOUNTER — Ambulatory Visit (INDEPENDENT_AMBULATORY_CARE_PROVIDER_SITE_OTHER)
Admission: RE | Admit: 2019-12-12 | Discharge: 2019-12-12 | Disposition: A | Discharge status: Home / Self Care | Payer: Medicare Other | Source: Ambulatory Visit | Attending: Family Medicine | Admitting: Family Medicine

## 2019-12-12 DIAGNOSIS — M62838 Other muscle spasm: Secondary | ICD-10-CM

## 2019-12-12 DIAGNOSIS — M47812 Spondylosis without myelopathy or radiculopathy, cervical region: Secondary | ICD-10-CM

## 2019-12-12 DIAGNOSIS — M542 Cervicalgia: Secondary | ICD-10-CM | POA: Diagnosis not present

## 2019-12-13 ENCOUNTER — Ambulatory Visit (INDEPENDENT_AMBULATORY_CARE_PROVIDER_SITE_OTHER): Payer: Medicare Other | Admitting: Physician Assistant

## 2019-12-13 ENCOUNTER — Encounter: Payer: Self-pay | Admitting: Physician Assistant

## 2019-12-13 ENCOUNTER — Ambulatory Visit: Payer: Self-pay

## 2019-12-13 DIAGNOSIS — M14672 Charcot's joint, left ankle and foot: Secondary | ICD-10-CM

## 2019-12-13 MED ORDER — DOXYCYCLINE HYCLATE 100 MG PO TABS: 100.0000 mg | ORAL_TABLET | Freq: Two times a day (BID) | ORAL | 0 refills | Status: DC

## 2019-12-13 NOTE — Progress Notes (Addendum)
Office Visit Note   [name]           Date of Birth: 09-24-1945           MRN: [id number] Visit Date: 12/13/2019              Requested by: Vivi Barrack, MD [id number] [place],  [place] [address] PCP: Vivi Barrack, MD  Chief Complaint  Patient presents with  . Left Foot - Wound Check      HPI: This is a 74 year old woman followed periodically by Dr. Sharol Given for Charcot foot with insensate diabetic neuropathy.  She periodically gets a callus debrided on the medial side of her left foot.  She comes in today as she states she noticed that the callus has thickened and she has a foul odor.  She also noticed some fluid into the bottom of her foot.  She denies any injuries.  She denies any fever or chills.  Assessment & Plan: Visit Diagnoses:  1. Charcot's joint, left ankle and foot     Plan: Patient was placed in a silver nitrate dressing.  We will limit her weightbearing with a postoperative shoe.  I will place her on a course of doxycycline over the weekend.  I have told her that if she sees any redness in her foot any increased symptoms of pain or discoloration or fever or chills she is to go directly to the emergency room.  Otherwise Dr. Sharol Given will follow up with her on Monday morning.  Patient states that she would rather not go to the emergency room but understands that she will if she has any of these changes  Follow-Up Instructions: No follow-ups on file.   Ortho Exam  Patient is alert, oriented, no adenopathy, well-dressed, normal affect, normal respiratory effort. Left foot: Pulses are palpable..  No cellulitis.  She does have a thickened callus on the medial side of her foot.  She has some delamination of the skin underneath her foot.  No surrounding cellulitis no ascending cellulitis.  After obtaining verbal consent I did debride the callus.  I could not probe into any purulent fluid.  Small area of opening on the callus beneath was fibrinous tissue.  Part of  this was found to be debrided to bleeding tissue.  Also remove some of the delaminated skin on the bottom of her foot.  There was some minimal drainage of foul-smelling fluid.  Wound was dressed with the Silvadene dressing patient was placed in a postoperative shoe  Imaging: DG Cervical Spine Complete  Result Date: 12/12/2019 CLINICAL DATA:  Neck pain without known injury. EXAM: CERVICAL SPINE - COMPLETE 4+ VIEW COMPARISON:  August 08, 2012. FINDINGS: No fracture or spondylolisthesis is noted. Moderate degenerative disc disease is noted at C5-6 and C6-7 with anterior posterior osteophyte formation. Mild bilateral neural foraminal stenosis is noted at these levels secondary to uncovertebral spurring. IMPRESSION: Moderate degenerative disc disease is noted at C5-6 and C6-7. No acute abnormality is noted in the cervical spine. Electronically Signed   By: Marijo Conception M.D.   On: 12/12/2019 16:59   No images are attached to the encounter.  Labs: Lab Results  Component Value Date   HGBA1C 7.4 (A) 11/23/2018   HGBA1C 7.4 (A) 07/10/2018   HGBA1C 6.6 (A) 11/28/2017   ESRSEDRATE 36 (H) 02/24/2017   CRP 1.2 (H) 02/24/2017   LABURIC 5.5 09/01/2015   LABORGA GROUP B STREP (S.AGALACTIAE) ISOLATED 12/26/2014     Lab  Results  Component Value Date   ALBUMIN 3.8 05/19/2017   ALBUMIN 3.6 04/21/2017   ALBUMIN 3.6 02/24/2017   PREALBUMIN 19.4 03/05/2012   LABURIC 5.5 09/01/2015    No results found for: MG No results found for: VD25OH  Lab Results  Component Value Date   PREALBUMIN 19.4 03/05/2012   CBC EXTENDED Latest Ref Rng & Units 09/25/2019 07/04/2019 02/22/2019  WBC 4.0 - 10.5 K/uL 10.7(H) 10.5 11.1(H)  RBC 3.87 - 5.11 Mil/uL 4.59 4.27 4.68  HGB 12.0 - 15.0 g/dL 12.4 11.8(L) 12.6  HCT 36 - 46 % 38.2 37.6 40.0  PLT 150 - 400 K/uL 391.0 375 327  NEUTROABS 1.4 - 7.7 K/uL 7.6 7.6 7.2  LYMPHSABS 0.7 - 4.0 K/uL 2.2 2.0 2.9     There is no height or weight on file to calculate BMI.  Orders:   Orders Placed This Encounter  Procedures  . XR Foot 2 Views Left   Meds ordered this encounter  Medications  . doxycycline (VIBRA-TABS) 100 MG tablet    Sig: Take 1 tablet (100 mg total) by mouth 2 (two) times daily.    Dispense:  60 tablet    Refill:  0     Procedures: No procedures performed  Clinical Data: No additional findings.  ROS:  All other systems negative, except as noted in the HPI. Review of Systems  Objective: Vital Signs: There were no vitals taken for this visit.  Specialty Comments:  No specialty comments available.  PMFS History: Patient Active Problem List   Diagnosis Date Noted  . Memory loss 11/21/2019  . Mild nonproliferative diabetic retinopathy of both eyes ([place]) 10/23/2019  . Nuclear sclerotic cataract of both eyes 10/23/2019  . Posterior vitreous detachment of right eye 10/23/2019  . Retinal hemorrhage of left eye 10/23/2019  . Hyperlipidemia associated with type 2 diabetes mellitus ([place]) 11/30/2017  . Impingement syndrome of right shoulder 07/06/2017  . Iron deficiency anemia 05/11/2017  . Morbid obesity ([place]) 04/24/2017  . Anemia 02/24/2017  . Baker's cyst 07/10/2016  . Onychomycosis 12/07/2015  . Upper airway cough syndrome 03/05/2014  . History of amputation of lesser toe of right foot ([place]) 04/15/2013  . Hypertension associated with diabetes ([place]) 12/05/2006  . Uncontrolled type 2 diabetes mellitus with peripheral circulatory disorder ([place]) 10/12/2006  . Dyslipidemia associated with type 2 diabetes mellitus ([place]) 10/12/2006  . Allergic rhinitis 10/12/2006  . GERD - Followed by Dr. Fuller Plan 10/12/2006  . IRRITABLE BOWEL SYNDROME - followed by Dr. Fuller Plan 10/12/2006  . Peripheral neuropathy 10/11/2006  . ALOPECIA NEC 10/11/2006   Past Medical History:  Diagnosis Date  . Alopecia   . Anemia, mild   . Arthritis   . Chronic cough    sees pulmonologist  . Chronic pain    chest wall and abd - s/p extensive eval  . Diabetes mellitus  with neuropathy ([place])    sees endocrine  . Diverticulosis   . Fatty liver   . GERD (gastroesophageal reflux disease)    takes Nexium bid, hx erosive esophagitis  . Headache(784.0)    occasionally;r/t sinus   . History of colon polyps   . HTN (hypertension)   . Hx of amputation of lesser toe ([place])    sees podiatrist  . Hyperlipemia   . IBS (irritable bowel syndrome)   . Insomnia    takes Elavil nightly  . Joint pain   . Neuropathy   . Osteomyelitis ([place])   . Pneumonia   . PONV (postoperative  nausea and vomiting)   . Seasonal allergies    takes Zyrtec daily  . Sinus tachycardia     Family History  Problem Relation Age of Onset  . Lung cancer Mother 43       smoked heavily  . Emphysema Mother   . Hypertension Father   . Hyperlipidemia Father   . Diabetes Father   . Coronary artery disease Father   . Dementia Father   . COPD Father        smoked  . Stomach cancer Paternal Aunt   . Brain cancer Paternal Uncle   . Irritable bowel syndrome Other        Several family members on fathers side   . Diabetes Other   . Stomach cancer Maternal Aunt   . Lung cancer Paternal Uncle   . Heart disease Paternal Uncle   . Colon cancer Neg Hx     Past Surgical History:  Procedure Laterality Date  . AMPUTATION  12/28/2011   Procedure: AMPUTATION DIGIT;  Surgeon: Newt Minion, MD;  Location: [facility];  Service: Orthopedics;  Laterality: Right;  Right Foot 2nd Toe Amputation at MTP (metatarsophalangeal joint)  . AMPUTATION Right 04/25/2012   Procedure: Right Foot 3rd Toe Amputation;  Surgeon: Newt Minion, MD;  Location: [facility];  Service: Orthopedics;  Laterality: Right;  Right Foot Third Toe Amputation   . AMPUTATION Right 07/27/2012   Procedure: Right 4th Toe Amputation at Metatarsophalangeal;  Surgeon: Newt Minion, MD;  Location: [facility];  Service: Orthopedics;  Laterality: Right;  Right 4th Toe Amputation at Metatarsophalangeal  . AMPUTATION Left 07/15/2016   Procedure: Left 2nd Ray  Amputation;  Surgeon: Newt Minion, MD;  Location: [facility];  Service: Orthopedics;  Laterality: Left;  . AMPUTATION Left 11/18/2016   Procedure: Left 3rd and 4th Ray Amputation;  Surgeon: Newt Minion, MD;  Location: [facility];  Service: Orthopedics;  Laterality: Left;  . AMPUTATION Right 03/29/2017   Procedure: RIGHT FOOT 3RD AND 4TH RAY AMPUTATION;  Surgeon: Newt Minion, MD;  Location: [facility];  Service: Orthopedics;  Laterality: Right;  . COLONOSCOPY    . LAPAROSCOPIC APPENDECTOMY  01/05/2011   Procedure: APPENDECTOMY LAPAROSCOPIC;  Surgeon: Pedro Earls, MD;  Location: [facility];  Service: General;  Laterality: N/A;  . OOPHORECTOMY  2001  . ROTATOR CUFF REPAIR Right    x 2  . TUBAL LIGATION    . VAGINAL HYSTERECTOMY  2001   Social History   Occupational History  . Occupation: Retired   Tobacco Use  . Smoking status: Never Smoker  . Smokeless tobacco: Never Used  Vaping Use  . Vaping Use: Never used  Substance and Sexual Activity  . Alcohol use: No  . Drug use: No  . Sexual activity: Yes    Birth control/protection: Surgical

## 2019-12-16 ENCOUNTER — Encounter: Payer: Self-pay | Admitting: Orthopedic Surgery

## 2019-12-16 ENCOUNTER — Ambulatory Visit (INDEPENDENT_AMBULATORY_CARE_PROVIDER_SITE_OTHER): Payer: Medicare Other | Admitting: Orthopedic Surgery

## 2019-12-16 VITALS — Ht 65.0 in | Wt 225.0 lb

## 2019-12-16 DIAGNOSIS — M14672 Charcot's joint, left ankle and foot: Secondary | ICD-10-CM | POA: Diagnosis not present

## 2019-12-16 DIAGNOSIS — Z23 Encounter for immunization: Secondary | ICD-10-CM | POA: Diagnosis not present

## 2019-12-16 DIAGNOSIS — L97521 Non-pressure chronic ulcer of other part of left foot limited to breakdown of skin: Secondary | ICD-10-CM

## 2019-12-16 NOTE — Progress Notes (Signed)
Office Visit Note   [name]           Date of Birth: [birth year]           MRN: [id number] Visit Date: 12/16/2019              Requested by: Vivi Barrack, MD [address] [place],  [place] [address] PCP: Vivi Barrack, MD  Chief Complaint  Patient presents with  . Left Foot - Follow-up    Wound check      HPI: Patient is a 74 year old woman who presents in follow-up for new extensive ulceration of the plantar aspect of the left foot she is currently on oral antibiotics denies any drainage she is currently in a postoperative shoe.  Assessment & Plan: Visit Diagnoses:  1. Charcot's joint, left ankle and foot   2. Ulcer of left foot, limited to breakdown of skin ([place])     Plan: Recommended dry dressing change daily protected weightbearing discontinue her antibiotics she may of developed an allergic reaction to the antibiotics.  Follow-Up Instructions: Return in about 4 weeks (around 01/13/2020).   Ortho Exam  Patient is alert, oriented, no adenopathy, well-dressed, normal affect, normal respiratory effort. Examination patient has no acute Charcot arthropathy changes her foot has normal temperature color.  There is a large ulcer on the plantar aspect of the left foot.  I performed consent a 10 blade knife was used to excise skin site and soft tissue.  The ulcer is 5 cm in diameter 1 mm deep with healthy granulation tissue at the base this was touched with silver nitrate there is no exposed bone or tendon no drainage no signs of any deep infection.  She has developed a little bit of a rash in her leg which is itchy this appears to be an allergic reaction possibly due to the doxycycline we will have her stop the doxycycline.  She has a strong dorsalis pedis pulse.  No signs of infection.  There is no redness no cellulitis.  Imaging: No results found. No images are attached to the encounter.  Labs: Lab Results  Component Value Date   HGBA1C 7.4 (A) 11/23/2018    HGBA1C 7.4 (A) 07/10/2018   HGBA1C 6.6 (A) 11/28/2017   ESRSEDRATE 36 (H) 02/24/2017   CRP 1.2 (H) 02/24/2017   LABURIC 5.5 09/01/2015   LABORGA GROUP B STREP (S.AGALACTIAE) ISOLATED 12/26/2014     Lab Results  Component Value Date   ALBUMIN 3.8 05/19/2017   ALBUMIN 3.6 04/21/2017   ALBUMIN 3.6 02/24/2017   PREALBUMIN 19.4 03/05/2012   LABURIC 5.5 09/01/2015    No results found for: MG No results found for: VD25OH  Lab Results  Component Value Date   PREALBUMIN 19.4 03/05/2012   CBC EXTENDED Latest Ref Rng & Units 09/25/2019 07/04/2019 02/22/2019  WBC 4.0 - 10.5 K/uL 10.7(H) 10.5 11.1(H)  RBC 3.87 - 5.11 Mil/uL 4.59 4.27 4.68  HGB 12.0 - 15.0 g/dL 12.4 11.8(L) 12.6  HCT 36 - 46 % 38.2 37.6 40.0  PLT 150 - 400 K/uL 391.0 375 327  NEUTROABS 1.4 - 7.7 K/uL 7.6 7.6 7.2  LYMPHSABS 0.7 - 4.0 K/uL 2.2 2.0 2.9     Body mass index is 37.44 kg/m.  Orders:  No orders of the defined types were placed in this encounter.  No orders of the defined types were placed in this encounter.    Procedures: No procedures performed  Clinical Data: No additional findings.  ROS:  All other systems negative, except as noted in the HPI. Review of Systems  Objective: Vital Signs: Ht 5\' 5"  (1.651 m)   Wt 225 lb (102.1 kg)   BMI 37.44 kg/m   Specialty Comments:  No specialty comments available.  PMFS History: Patient Active Problem List   Diagnosis Date Noted  . Memory loss 11/21/2019  . Mild nonproliferative diabetic retinopathy of both eyes ([place]) 10/23/2019  . Nuclear sclerotic cataract of both eyes 10/23/2019  . Posterior vitreous detachment of right eye 10/23/2019  . Retinal hemorrhage of left eye 10/23/2019  . Hyperlipidemia associated with type 2 diabetes mellitus ([place]) 11/30/2017  . Impingement syndrome of right shoulder 07/06/2017  . Iron deficiency anemia 05/11/2017  . Morbid obesity ([place]) 04/24/2017  . Anemia 02/24/2017  . Baker's cyst 07/10/2016  . Onychomycosis  12/07/2015  . Upper airway cough syndrome 03/05/2014  . History of amputation of lesser toe of right foot ([place]) 04/15/2013  . Hypertension associated with diabetes ([place]) 12/05/2006  . Uncontrolled type 2 diabetes mellitus with peripheral circulatory disorder ([place]) 10/12/2006  . Dyslipidemia associated with type 2 diabetes mellitus ([place]) 10/12/2006  . Allergic rhinitis 10/12/2006  . GERD - Followed by Dr. Fuller Plan 10/12/2006  . IRRITABLE BOWEL SYNDROME - followed by Dr. Fuller Plan 10/12/2006  . Peripheral neuropathy 10/11/2006  . ALOPECIA NEC 10/11/2006   Past Medical History:  Diagnosis Date  . Alopecia   . Anemia, mild   . Arthritis   . Chronic cough    sees pulmonologist  . Chronic pain    chest wall and abd - s/p extensive eval  . Diabetes mellitus with neuropathy ([place])    sees endocrine  . Diverticulosis   . Fatty liver   . GERD (gastroesophageal reflux disease)    takes Nexium bid, hx erosive esophagitis  . Headache(784.0)    occasionally;r/t sinus   . History of colon polyps   . HTN (hypertension)   . Hx of amputation of lesser toe ([place])    sees podiatrist  . Hyperlipemia   . IBS (irritable bowel syndrome)   . Insomnia    takes Elavil nightly  . Joint pain   . Neuropathy   . Osteomyelitis ([place])   . Pneumonia   . PONV (postoperative nausea and vomiting)   . Seasonal allergies    takes Zyrtec daily  . Sinus tachycardia     Family History  Problem Relation Age of Onset  . Lung cancer Mother 76       smoked heavily  . Emphysema Mother   . Hypertension Father   . Hyperlipidemia Father   . Diabetes Father   . Coronary artery disease Father   . Dementia Father   . COPD Father        smoked  . Stomach cancer Paternal Aunt   . Brain cancer Paternal Uncle   . Irritable bowel syndrome Other        Several family members on fathers side   . Diabetes Other   . Stomach cancer Maternal Aunt   . Lung cancer Paternal Uncle   . Heart disease Paternal Uncle   . Colon  cancer Neg Hx     Past Surgical History:  Procedure Laterality Date  . AMPUTATION  12/28/2011   Procedure: AMPUTATION DIGIT;  Surgeon: Newt Minion, MD;  Location: [facility];  Service: Orthopedics;  Laterality: Right;  Right Foot 2nd Toe Amputation at MTP (metatarsophalangeal joint)  . AMPUTATION Right 04/25/2012   Procedure: Right Foot 3rd  Toe Amputation;  Surgeon: Newt Minion, MD;  Location: [facility];  Service: Orthopedics;  Laterality: Right;  Right Foot Third Toe Amputation   . AMPUTATION Right 07/27/2012   Procedure: Right 4th Toe Amputation at Metatarsophalangeal;  Surgeon: Newt Minion, MD;  Location: [facility];  Service: Orthopedics;  Laterality: Right;  Right 4th Toe Amputation at Metatarsophalangeal  . AMPUTATION Left 07/15/2016   Procedure: Left 2nd Ray Amputation;  Surgeon: Newt Minion, MD;  Location: [facility];  Service: Orthopedics;  Laterality: Left;  . AMPUTATION Left 11/18/2016   Procedure: Left 3rd and 4th Ray Amputation;  Surgeon: Newt Minion, MD;  Location: [facility];  Service: Orthopedics;  Laterality: Left;  . AMPUTATION Right 03/29/2017   Procedure: RIGHT FOOT 3RD AND 4TH RAY AMPUTATION;  Surgeon: Newt Minion, MD;  Location: [facility];  Service: Orthopedics;  Laterality: Right;  . COLONOSCOPY    . LAPAROSCOPIC APPENDECTOMY  01/05/2011   Procedure: APPENDECTOMY LAPAROSCOPIC;  Surgeon: Pedro Earls, MD;  Location: [facility];  Service: General;  Laterality: N/A;  . OOPHORECTOMY  2001  . ROTATOR CUFF REPAIR Right    x 2  . TUBAL LIGATION    . VAGINAL HYSTERECTOMY  2001   Social History   Occupational History  . Occupation: Retired   Tobacco Use  . Smoking status: Never Smoker  . Smokeless tobacco: Never Used  Vaping Use  . Vaping Use: Never used  Substance and Sexual Activity  . Alcohol use: No  . Drug use: No  . Sexual activity: Yes    Birth control/protection: Surgical

## 2019-12-18 DIAGNOSIS — M310 Hypersensitivity angiitis: Secondary | ICD-10-CM | POA: Diagnosis not present

## 2019-12-19 ENCOUNTER — Ambulatory Visit: Payer: Medicare Other | Admitting: Orthopedic Surgery

## 2019-12-19 ENCOUNTER — Other Ambulatory Visit: Payer: Self-pay

## 2019-12-19 ENCOUNTER — Ambulatory Visit (INDEPENDENT_AMBULATORY_CARE_PROVIDER_SITE_OTHER): Payer: Medicare Other | Admitting: Internal Medicine

## 2019-12-19 ENCOUNTER — Encounter: Payer: Self-pay | Admitting: Internal Medicine

## 2019-12-19 VITALS — BP 130/82 | HR 75 | Ht 65.0 in | Wt 225.4 lb

## 2019-12-19 DIAGNOSIS — G63 Polyneuropathy in diseases classified elsewhere: Secondary | ICD-10-CM | POA: Diagnosis not present

## 2019-12-19 DIAGNOSIS — E1151 Type 2 diabetes mellitus with diabetic peripheral angiopathy without gangrene: Secondary | ICD-10-CM

## 2019-12-19 DIAGNOSIS — E1165 Type 2 diabetes mellitus with hyperglycemia: Secondary | ICD-10-CM | POA: Diagnosis not present

## 2019-12-19 DIAGNOSIS — E785 Hyperlipidemia, unspecified: Secondary | ICD-10-CM | POA: Diagnosis not present

## 2019-12-19 DIAGNOSIS — IMO0002 Reserved for concepts with insufficient information to code with codable children: Secondary | ICD-10-CM

## 2019-12-19 DIAGNOSIS — E1169 Type 2 diabetes mellitus with other specified complication: Secondary | ICD-10-CM | POA: Diagnosis not present

## 2019-12-19 LAB — COMPREHENSIVE METABOLIC PANEL WITH GFR
ALT: 8 U/L (ref 0–35)
AST: 15 U/L (ref 0–37)
Albumin: 4 g/dL (ref 3.5–5.2)
Alkaline Phosphatase: 46 U/L (ref 39–117)
BUN: 21 mg/dL (ref 6–23)
CO2: 32 meq/L (ref 19–32)
Calcium: 9.8 mg/dL (ref 8.4–10.5)
Chloride: 96 meq/L (ref 96–112)
Creatinine, Ser: 0.92 mg/dL (ref 0.40–1.20)
GFR: 62.34 mL/min
Glucose, Bld: 148 mg/dL — ABNORMAL HIGH (ref 70–99)
Potassium: 3.7 meq/L (ref 3.5–5.1)
Sodium: 137 meq/L (ref 135–145)
Total Bilirubin: 0.4 mg/dL (ref 0.2–1.2)
Total Protein: 8.2 g/dL (ref 6.0–8.3)

## 2019-12-19 LAB — MICROALBUMIN / CREATININE URINE RATIO
Creatinine,U: 59.4 mg/dL
Microalb Creat Ratio: 2.2 mg/g (ref 0.0–30.0)
Microalb, Ur: 1.3 mg/dL (ref 0.0–1.9)

## 2019-12-19 LAB — POCT GLYCOSYLATED HEMOGLOBIN (HGB A1C): Hemoglobin A1C: 8.2 % — AB (ref 4.0–5.6)

## 2019-12-19 LAB — LIPID PANEL
Cholesterol: 92 mg/dL (ref 0–200)
HDL: 36.3 mg/dL — ABNORMAL LOW
LDL Cholesterol: 33 mg/dL (ref 0–99)
NonHDL: 55.82
Total CHOL/HDL Ratio: 3
Triglycerides: 116 mg/dL (ref 0.0–149.0)
VLDL: 23.2 mg/dL (ref 0.0–40.0)

## 2019-12-19 NOTE — Progress Notes (Addendum)
Subjective:     Patient ID: Luree Palla, female   DOB: 1945/02/15, 74 y.o.   MRN: [id number]  This visit occurred during the SARS-CoV-2 public health emergency.  Safety protocols were in place, including screening questions prior to the visit, additional usage of staff PPE, and extensive cleaning of exam room while observing appropriate contact time as indicated for disinfecting solutions.   HPI Ms. Kilian is a pleasant 74 y.o. woman returning for f/u for DM2, dx ~2000, uncontrolled, insulin-dependent, with complications (diabetic peripheral neuropathy, multiple toe amputations). Last visit 1 year and 1 month ago.  She developed a maculo-papular rash possibly from Doxycycline, used for presumed osteomyelitis but the diagnosis was not confirmed so she was taken off doxycycline 3 days ago.  She saw dermatology who performed a skin biopsy.  Results are pending.  She is developing memory loss.   Reviewed HbA1c levels: Lab Results  Component Value Date   HGBA1C 7.4 (A) 11/23/2018   HGBA1C 7.4 (A) 07/10/2018   HGBA1C 6.6 (A) 11/28/2017   She is on:: - Metformin 1000 mg 2x a day Insulin Before breakfast Before lunch Before dinner At bedtime  Regular 20-25 5-10 units 10-15   NPH 20 x x 10   She also tried: Levemir 47 units in am and 37 units in pm >> $800 for 3 mo supply Januvia 100 mg daily - $450 for 3 mo Invokana 100 mg - $455 for 3 mo Took Byetta before We discussed about starting her on a VGo mechanical pump in the past. She had an appointment with diabetes education but decided not to pursue it. We also tried a sliding scale of regular insulin in the past but she was not using it. We tried Trulicity but she could not tolerate it due to nausea, diarrhea, constipation, weakness.  She checks her sugars 2-4 times a day: - am: 137-159, 200, 203  >> 90s, 120-140s, 220, 242 >> 71, 110-198, 213 - 2h after b'fast :  132-242 >> 105-208 >> 155-255 >> n/c >> 199-325 - prelunch:  70-196 >>  164, 236 >> 78, 188-274 >> 100-200s >> n/c - 2-3h after lunch: 110-234, 255 >> 61-351 >> 66, 101-231 >> n/c >> 58,  144-275 - before dinner: 108-170, 277 >> 88-151, 276 >> 140-200s >> 222 - after dinner: 148-249 >> 177, 186 >> 82-176, 290 >> n/c >> 213-246 - bedtime: 134-224, 340 >> 78-178, 290 >> 180-200s, 300s >> 66, 105-256 - nighttime: 70-111, 220 >> 48, 71-172, 273 >> 40's, 110-300s >> 123-241 Lowest: 48 >> 40s x3 (did not eat supper) >> 58.  Has hypoglycemia awareness in the 70s. Highest: 405 (Prednisone) >> 340 >> 342 in 05/2018 >> n/c >> 325.  Meals: - Breakfast: egg, bacon, toast; grits; biscuit; fruit; sometimes skips >> 2 slices of toast + butter - Lunch: 1/2 PB sandwich or soup and yoghurt, sometimes crackers - Dinner: meat + vegetables + some starch - Snacks: fruit   No CKD: BUN  Date Value Ref Range Status  11/23/2018 18 7 - 25 mg/dL Final   Creat  Date Value Ref Range Status  11/23/2018 0.87 0.60 - 0.93 mg/dL Final    Comment:    For patients >27 years of age, the reference limit for Creatinine is approximately 13% higher for people identified as African-American. .   On irbesartan.  -+ HL; latest lipid panel: Lab Results  Component Value Date   CHOL 139 11/23/2018   HDL 43.60 11/23/2018   LDLCALC 73 11/23/2018  TRIG 111.0 11/23/2018   CHOLHDL 3 11/23/2018  On rosuvastatin 20. - last eye exam:10/31/2019: Mild NPDR OU, without macular edema (Dr. Zadie Rhine). -+ Numbness and tingling in feet-previously on amitriptyline, now on Neurontin  Review of Systems  Constitutional: no weight gain/no weight loss, no fatigue, no subjective hyperthermia, no subjective hypothermia Eyes: no blurry vision, no xerophthalmia ENT: no sore throat, no nodules palpated in neck, no dysphagia, no odynophagia, no hoarseness Cardiovascular: no CP/no SOB/no palpitations/no leg swelling Respiratory: no cough/no SOB/no wheezing Gastrointestinal: no N/no V/no D/no C/no acid  reflux Musculoskeletal: no muscle aches/no joint aches Skin: no rashes, no hair loss Neurological: no tremors/+ numbness/+ tingling/no dizziness  I reviewed pt's medications, allergies, PMH, social hx, family hx, and changes were documented in the history of present illness. Otherwise, unchanged from my initial visit note.  Past Medical History:  Diagnosis Date  . Alopecia   . Anemia, mild   . Arthritis   . Chronic cough    sees pulmonologist  . Chronic pain    chest wall and abd - s/p extensive eval  . Diabetes mellitus with neuropathy ([place])    sees endocrine  . Diverticulosis   . Fatty liver   . GERD (gastroesophageal reflux disease)    takes Nexium bid, hx erosive esophagitis  . Headache(784.0)    occasionally;r/t sinus   . History of colon polyps   . HTN (hypertension)   . Hx of amputation of lesser toe ([place])    sees podiatrist  . Hyperlipemia   . IBS (irritable bowel syndrome)   . Insomnia    takes Elavil nightly  . Joint pain   . Neuropathy   . Osteomyelitis ([place])   . Pneumonia   . PONV (postoperative nausea and vomiting)   . Seasonal allergies    takes Zyrtec daily  . Sinus tachycardia    Past Surgical History:  Procedure Laterality Date  . AMPUTATION  12/28/2011   Procedure: AMPUTATION DIGIT;  Surgeon: Newt Minion, MD;  Location: [facility];  Service: Orthopedics;  Laterality: Right;  Right Foot 2nd Toe Amputation at MTP (metatarsophalangeal joint)  . AMPUTATION Right 04/25/2012   Procedure: Right Foot 3rd Toe Amputation;  Surgeon: Newt Minion, MD;  Location: [facility];  Service: Orthopedics;  Laterality: Right;  Right Foot Third Toe Amputation   . AMPUTATION Right 07/27/2012   Procedure: Right 4th Toe Amputation at Metatarsophalangeal;  Surgeon: Newt Minion, MD;  Location: [facility];  Service: Orthopedics;  Laterality: Right;  Right 4th Toe Amputation at Metatarsophalangeal  . AMPUTATION Left 07/15/2016   Procedure: Left 2nd Ray Amputation;  Surgeon: Newt Minion,  MD;  Location: [facility];  Service: Orthopedics;  Laterality: Left;  . AMPUTATION Left 11/18/2016   Procedure: Left 3rd and 4th Ray Amputation;  Surgeon: Newt Minion, MD;  Location: [facility];  Service: Orthopedics;  Laterality: Left;  . AMPUTATION Right 03/29/2017   Procedure: RIGHT FOOT 3RD AND 4TH RAY AMPUTATION;  Surgeon: Newt Minion, MD;  Location: [facility];  Service: Orthopedics;  Laterality: Right;  . COLONOSCOPY    . LAPAROSCOPIC APPENDECTOMY  01/05/2011   Procedure: APPENDECTOMY LAPAROSCOPIC;  Surgeon: Pedro Earls, MD;  Location: [facility];  Service: General;  Laterality: N/A;  . OOPHORECTOMY  2001  . ROTATOR CUFF REPAIR Right    x 2  . TUBAL LIGATION    . VAGINAL HYSTERECTOMY  2001   Social History   Socioeconomic History  . Marital status: Married  Spouse name: Not on file  . Number of children: 2  . Years of education: Not on file  . Highest education level: Not on file  Occupational History  . Occupation: Retired   Tobacco Use  . Smoking status: Never Smoker  . Smokeless tobacco: Never Used  Vaping Use  . Vaping Use: Never used  Substance and Sexual Activity  . Alcohol use: No  . Drug use: No  . Sexual activity: Yes    Birth control/protection: Surgical  Other Topics Concern  . Not on file  Social History Narrative   Caffeine daily    [employer], [employer] no diploma   Married '66   1 dtr- '78; 1 son '71; 2 grandchildren   Occupation: retired 04   Dad with alzheimers-had to place in [place] (summer '10)         Social Determinants of Radio broadcast assistant Strain: Not on Art therapist Insecurity: Not on file  Transportation Needs: Not on file  Physical Activity: Not on file  Stress: Not on file  Social Connections: Not on file  Intimate Partner Violence: Not on file   Current Outpatient Medications on File Prior to Visit  Medication Sig Dispense Refill  . acetaminophen (TYLENOL) 500 MG tablet Take 500 mg by mouth every 8 (eight) hours as needed for mild pain.      Marland Kitchen azelastine (ASTELIN) 0.1 % nasal spray Place 2 sprays into both nostrils 2 (two) times daily. 30 mL 12  . azithromycin (ZITHROMAX) 250 MG tablet Take 2 tabs day 1, then 1 tab daily 6 each 0  . betamethasone dipropionate (DIPROLENE) 0.05 % cream Apply 1 application topically 2 (two) times daily as needed (IRRITATION). 30 g 0  . Blood Glucose Monitoring Suppl (ONE TOUCH ULTRA 2) w/Device KIT Use to check blood sugar 1 each 0  . cholecalciferol (VITAMIN D) 1000 units tablet Take 1 tablet by mouth daily.     . cyclobenzaprine (FLEXERIL) 10 MG tablet Take 1 tablet (10 mg total) by mouth at bedtime as needed for muscle spasms. 30 tablet 0  . dicyclomine (BENTYL) 10 MG capsule Take 1 capsule (10 mg total) by mouth 3 (three) times daily as needed for spasms. 90 capsule 5  . doxycycline (VIBRA-TABS) 100 MG tablet Take 1 tablet (100 mg total) by mouth 2 (two) times daily. 60 tablet 0  . EQ ALLERGY RELIEF, CETIRIZINE, 10 MG tablet Take 1 tablet by mouth once daily 90 tablet 0  . Fluocinolone Acetonide 0.01 % OIL Use 1-2 drops daily as needed. 20 mL 0  . gabapentin (NEURONTIN) 100 MG capsule TAKE 2-3 CAPSULES BY MOUTH AT BEDTIME 180 capsule 3  . glucagon (GLUCAGEN) 1 MG SOLR injection Inject 1 mg into the muscle once as needed for up to 1 dose for low blood sugar. 1 each 11  . insulin NPH Human (NOVOLIN N RELION) 100 UNIT/ML injection Inject under skin 15 units in am and 10 units at bedtime (Patient taking differently: Inject under skin 12-15 units in am  and 10-15 units at bedtime) 20 mL 3  . insulin regular (NOVOLIN R RELION) 100 units/mL injection INJECT 12 TO 30 UNITS SUBCUTANEOUSLY THREE TIMES DAILY BEFORE MEAL(S) 80 mL 1  . Insulin Syringe-Needle U-100 (RELION INSULIN SYRINGE 1ML/31G) 31G X 5/16" 1 ML MISC USE 2 TIMES A DAY 100 each 2  . irbesartan-hydrochlorothiazide (AVALIDE) 150-12.5 MG tablet Take 1 tablet by mouth daily. 90 tablet 1  . metFORMIN (GLUCOPHAGE) 1000 MG tablet  TAKE 1 TABLET TWICE A  DAY WITH MEALS 180 tablet 3  . metoprolol succinate (TOPROL-XL) 25 MG 24 hr tablet Take 1 tablet (25 mg total) by mouth daily. 90 tablet 2  . omeprazole (PRILOSEC) 40 MG capsule Take 1 capsule (40 mg total) by mouth 2 (two) times daily. 180 capsule 3  . ONETOUCH ULTRA test strip USE 1 STRIP TO CHECK GLUCOSE THREE TIMES DAILY 300 each 11  . OVER THE COUNTER MEDICATION Apply 1-3 application topically at bedtime. TOPRICIN FOOT CREAM - NEUROPATHY FOOT CREAM **APPLIES TO BOTH FEET AT BEDTIME**    . Probiotic Product (PROBIOTIC DAILY PO) Take 1 capsule by mouth daily.    . promethazine-dextromethorphan (PROMETHAZINE-DM) 6.25-15 MG/5ML syrup Take 2.5 mLs by mouth 4 (four) times daily as needed for cough. 118 mL 0  . rosuvastatin (CRESTOR) 20 MG tablet Take 1 tablet (20 mg total) by mouth daily. 90 tablet 3  . silver sulfADIAZINE (SILVADENE) 1 % cream Apply 1 application topically daily. 50 g 1  . vitamin B-12 (CYANOCOBALAMIN) 1000 MCG tablet Take 1,000 mcg daily by mouth.     No current facility-administered medications on file prior to visit.   Allergies  Allergen Reactions  . Codeine Other (See Comments)    Makes her crazy  . Propoxyphene Hcl Itching    *DARVOCET    Family History  Problem Relation Age of Onset  . Lung cancer Mother 55       smoked heavily  . Emphysema Mother   . Hypertension Father   . Hyperlipidemia Father   . Diabetes Father   . Coronary artery disease Father   . Dementia Father   . COPD Father        smoked  . Stomach cancer Paternal Aunt   . Brain cancer Paternal Uncle   . Irritable bowel syndrome Other        Several family members on fathers side   . Diabetes Other   . Stomach cancer Maternal Aunt   . Lung cancer Paternal Uncle   . Heart disease Paternal Uncle   . Colon cancer Neg Hx     Objective:   Physical Exam BP 130/82   Pulse 75   Ht 5' 5" (1.651 m)   Wt 225 lb 6.4 oz (102.2 kg)   SpO2 98%   BMI 37.51 kg/m  Body mass index is 37.51  kg/m. Wt Readings from Last 3 Encounters:  12/19/19 225 lb 6.4 oz (102.2 kg)  12/16/19 225 lb (102.1 kg)  12/11/19 225 lb 9.6 oz (102.3 kg)   Constitutional: overweight, in NAD Eyes: PERRLA, EOMI, no exophthalmos ENT: moist mucous membranes, no thyromegaly, no cervical lymphadenopathy Cardiovascular: RRR, No MRG Respiratory: CTA B Gastrointestinal: abdomen soft, NT, ND, BS+ Musculoskeletal: + Deformities (several amputations of toes-see below; Charcot deformity of the left foot), left foot in boot, strength intact in all 4 Skin: moist, warm, + red-violaceous, vesicular, macular papular rash on LEs Neurological: no tremor with outstretched hands, DTR normal in all 4  Assessment:     1. 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 HL  Plan:     1. DM2 -  pt with longstanding, uncontrolled, type 2 diabetes, on basal/bolus insulin regimen with NPH and regular insulin due to price, and also on Metformin.  Sugars are usually very fluctuating, consistent with insulin deficiency.  We did not check her for type 1 diabetes since this would not change the management.  At last visit, sugars were still fluctuating but improved in the morning.  Later in the day, they did not follow a particular pattern, but they were usually higher.  We increased her NPH insulin dose in the morning.  She was still having occasional low blood sugars at night in the 140s.  These usually happened after injecting regular insulin before dinner and not eating everything that she planned to eat.  We discussed about using a lower dose of regular insulin if she did not think she would eat much.  At that time,  HbA1c was stable, at 7.4%. -At this visit, we reviewed her blood sugar and it appears that her sugars are more variable than before.  They range from 58-325, without a particular pattern.  We discussed that this is most likely related to inconsistencies in her meals and insulin doses.  She mentions that she is very busy and many times she is not at home for meals so she cannot get the regular insulin 30 min before a meal, as recommended.  He does mention that she did not have as many low blood sugars since last visit which is definitely a plus, but she now has many more lows.  In the last week, however, I do see an improvement in her blood sugars, which I believe may be related to her improving her diet and insulin consistency in preparation for the appt. I strongly advised her to continue this practice. -For now, since sugars are high especially after breakfast and they remain high later in the day, I advised her to increase the regularity with breakfast and lunch.  We will not change the rest of the doses, however, I advised her that if the sugars do not improve after breakfast, to increase her NPH back to 25 units daily.  I also advised her to work on improving her meals.  - I advised her to: Patient Instructions   Please continue: - Metformin 1000 mg 2x a day Insulin Before breakfast Before lunch Before dinner At bedtime  Regular 25-30 10-15 10-15   NPH 20 x x 10   Please stop at the lab.  Please return in 4 months with your sugar log  - we checked her HbA1c: 8.2% (higher) - advised to check sugars at different times of the day - 3-4x a day, rotating check times - advised for yearly eye exams >> she is UTD - return to clinic in 4 months  2. PN -Due to diabetes -Continues to have numbness and tingling mostly in her left big toe -Previously on amitriptyline but now on Neurontin.  She takes 2 capsules at night.  I am usually refilling this for her.  3. HL -Reviewed latest lipid panel from  11/2018: LDL close to goal: Lab Results  Component Value Date   CHOL 139 11/23/2018   HDL 43.60 11/23/2018   LDLCALC 73 11/23/2018   TRIG 111.0 11/23/2018   CHOLHDL 3 11/23/2018  -Continues Crestor 20 without side effects -She is due for another lipid panel  Component     Latest Ref Rng & Units 12/19/2019  Sodium     135 - 145 mEq/L 137  Potassium  3.5 - 5.1 mEq/L 3.7  Chloride     96 - 112 mEq/L 96  CO2     19 - 32 mEq/L 32  Glucose     70 - 99 mg/dL 148 (H)  BUN     6 - 23 mg/dL 21  Creatinine     0.40 - 1.20 mg/dL 0.92  Total Bilirubin     0.2 - 1.2 mg/dL 0.4  Alkaline Phosphatase     39 - 117 U/L 46  AST     0 - 37 U/L 15  ALT     0 - 35 U/L 8  Total Protein     6.0 - 8.3 g/dL 8.2  Albumin     3.5 - 5.2 g/dL 4.0  GFR     >60.00 mL/min 62.34  Calcium     8.4 - 10.5 mg/dL 9.8  Cholesterol     0 - 200 mg/dL 92  Triglycerides     0.0 - 149.0 mg/dL 116.0  HDL Cholesterol     >39.00 mg/dL 36.30 (L)  VLDL     0.0 - 40.0 mg/dL 23.2  LDL (calc)     0 - 99 mg/dL 33  Total CHOL/HDL Ratio      3  NonHDL      55.82  Microalb, Ur     0.0 - 1.9 mg/dL 1.3  Creatinine,U     mg/dL 59.4  MICROALB/CREAT RATIO     0.0 - 30.0 mg/g 2.2  ACR is not elevated.  LDL is at goal.  HDL is slightly low.  Glucose is slightly high.  Philemon Kingdom, MD PhD [facility] Endocrinology

## 2019-12-19 NOTE — Patient Instructions (Addendum)
Please continue: - Metformin 1000 mg 2x a day Insulin Before breakfast Before lunch Before dinner At bedtime  Regular 25-30 10-15 10-15   NPH 20 x x 10   Please stop at the lab.  Please return in 4 months with your sugar log

## 2019-12-20 ENCOUNTER — Encounter: Payer: Self-pay | Admitting: Family Medicine

## 2019-12-20 ENCOUNTER — Ambulatory Visit (INDEPENDENT_AMBULATORY_CARE_PROVIDER_SITE_OTHER): Payer: Medicare Other | Admitting: Family Medicine

## 2019-12-20 VITALS — BP 113/66 | HR 76 | Temp 98.4°F | Ht 65.0 in | Wt 223.8 lb

## 2019-12-20 DIAGNOSIS — E1169 Type 2 diabetes mellitus with other specified complication: Secondary | ICD-10-CM | POA: Diagnosis not present

## 2019-12-20 DIAGNOSIS — E785 Hyperlipidemia, unspecified: Secondary | ICD-10-CM

## 2019-12-20 DIAGNOSIS — E1159 Type 2 diabetes mellitus with other circulatory complications: Secondary | ICD-10-CM

## 2019-12-20 DIAGNOSIS — I152 Hypertension secondary to endocrine disorders: Secondary | ICD-10-CM | POA: Diagnosis not present

## 2019-12-20 DIAGNOSIS — M542 Cervicalgia: Secondary | ICD-10-CM

## 2019-12-20 DIAGNOSIS — R21 Rash and other nonspecific skin eruption: Secondary | ICD-10-CM

## 2019-12-20 NOTE — Patient Instructions (Signed)
It was very nice to see you today!  I am glad that your neck is feeling better.  We do not need to make any other changes today.  I would like to see you back in about 6 months or so for your next checkup.  Please come back to see me sooner if needed.  Take care, Dr Jerline Pain  Please try these tips to maintain a healthy lifestyle:   Eat at least 3 REAL meals and 1-2 snacks per day.  Aim for no more than 5 hours between eating.  If you eat breakfast, please do so within one hour of getting up.    Each meal should contain half fruits/vegetables, one quarter protein, and one quarter carbs (no bigger than a computer mouse)   Cut down on sweet beverages. This includes juice, soda, and sweet tea.     Drink at least 1 glass of water with each meal and aim for at least 8 glasses per day   Exercise at least 150 minutes every week.

## 2019-12-20 NOTE — Assessment & Plan Note (Signed)
Last LDL 33.  Continue Crestor 20 mg daily.

## 2019-12-20 NOTE — Assessment & Plan Note (Signed)
Well-controlled today.  Continue irbesartan-HCTZ 150-12.5 daily and metoprolol succinate 25 mg daily.

## 2019-12-20 NOTE — Progress Notes (Signed)
   Beth Holden is a 74 y.o. female who presents today for an office visit.  Assessment/Plan:  New/Acute Problems: Neck Pain No red flags.  Symptoms have almost completely resolved.  We will continue with watchful waiting.  No need to do any further work-up at this point given the symptoms have resolved.  Rash Possibly reaction to doxycycline.  No mucocutaneous involvement concerning for Stevens-Johnson's.  She has been following with dermatology for this and will follow up with them again early next week.  Discussed reasons to return to care.  Chronic Problems Addressed Today: Hypertension associated with diabetes ([place]) Well-controlled today.  Continue irbesartan-HCTZ 150-12.5 daily and metoprolol succinate 25 mg daily.  Dyslipidemia associated with type 2 diabetes mellitus ([facility]) Last LDL 33.  Continue Crestor 20 mg daily.     Subjective:  HPI:  Patient here for follow-up for neck pain.  Saw another provider at this office about a week ago.  Concern for musculoskeletal etiology.  Was given Toradol.  This helped.  Was also given tramadol and Flexeril which did not help.  She has also had an issue with ulcer on her left foot.  She was prescribed doxycycline.  She has been seeing orthopedics for this.  She subsequently developed a rash.  She stopped the doxycycline.  Rash seems to be stable since then  She had a biopsy done on the rash earlier this week.      Objective:  Physical Exam: BP 113/66   Pulse 76   Temp 98.4 F (36.9 C) (Temporal)   Ht 5\' 5"  (1.651 Holden)   Wt 223 lb 12.8 oz (101.5 kg)   SpO2 97%   BMI 37.24 kg/Holden   Gen: No acute distress, resting comfortably Skin: Several discrete erythematous plaques on lower extremities. Neuro: Grossly normal, moves all extremities Psych: Normal affect and thought content      Beth Holden. Jerline Pain, MD 12/20/2019 11:13 AM

## 2019-12-23 ENCOUNTER — Encounter: Payer: Self-pay | Admitting: Orthopedic Surgery

## 2019-12-23 ENCOUNTER — Encounter: Payer: Self-pay | Admitting: Internal Medicine

## 2019-12-23 ENCOUNTER — Encounter: Payer: Self-pay | Admitting: Family Medicine

## 2019-12-23 ENCOUNTER — Telehealth: Payer: Self-pay

## 2019-12-23 ENCOUNTER — Ambulatory Visit (INDEPENDENT_AMBULATORY_CARE_PROVIDER_SITE_OTHER): Payer: Medicare Other | Admitting: Orthopedic Surgery

## 2019-12-23 VITALS — Ht 65.0 in | Wt 223.0 lb

## 2019-12-23 DIAGNOSIS — R21 Rash and other nonspecific skin eruption: Secondary | ICD-10-CM

## 2019-12-23 DIAGNOSIS — M14672 Charcot's joint, left ankle and foot: Secondary | ICD-10-CM | POA: Diagnosis not present

## 2019-12-23 NOTE — Telephone Encounter (Signed)
Patient is calling requesting a call from dr.parker or stella regarding some new lab requests. She wants to be worked in for an appt ASAP she states is very urgent, patient states she got results from her dermatologist and they did a biopsy and are going to send the results to [place] but that is also why she needs an appt

## 2019-12-23 NOTE — Telephone Encounter (Signed)
Pt stated dermatology office will send pathology report  Stated need blood testing.

## 2019-12-23 NOTE — Progress Notes (Signed)
Office Visit Note   [name]           Date of Birth: May 22, 1945           MRN: [id number] Visit Date: 12/23/2019              Requested by: Vivi Barrack, MD [address] [place],  [place] [address] PCP: Vivi Barrack, MD  Chief Complaint  Patient presents with  . 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  She has been to dermatology and has had a biopsy she is currently on a steroid cream.  Patient reports a recent Toradol injection before the rash started.  Assessment & Plan: Visit Diagnoses:  1. Charcot's joint, left ankle and foot   2. Rash and other nonspecific skin eruption     Plan: Recommended that she could use Benadryl for the itching.  Recommended using the knee-high compression stocking that should help with the healing.  Follow-Up Instructions: Return in about 4 weeks (around 01/20/2020).   Ortho Exam  Patient is alert, oriented, no adenopathy, well-dressed, normal affect, normal respiratory effort. Examination patient has larger eruptions from where the petechial rash was.  There is no cellulitis no signs of infection this involves both lower and upper extremities as well as her back.  This has the appearance of a inflammatory reaction to her medication most likely Toradol.  Imaging: No results found.   Labs: Lab Results  Component Value Date   HGBA1C 8.2 (A) 12/19/2019   HGBA1C 7.4 (A) 11/23/2018   HGBA1C 7.4 (A) 07/10/2018   ESRSEDRATE 36 (H) 02/24/2017   CRP 1.2 (H) 02/24/2017   LABURIC 5.5 09/01/2015   LABORGA GROUP B STREP (S.AGALACTIAE) ISOLATED 12/26/2014     Lab Results   Component Value Date   ALBUMIN 4.0 12/19/2019   ALBUMIN 3.8 05/19/2017   ALBUMIN 3.6 04/21/2017   PREALBUMIN 19.4 03/05/2012   LABURIC 5.5 09/01/2015    No results found for: MG No results found for: VD25OH  Lab Results  Component Value Date   PREALBUMIN 19.4 03/05/2012   CBC EXTENDED Latest Ref Rng & Units 09/25/2019 07/04/2019 02/22/2019  WBC 4.0 - 10.5 K/uL 10.7(H) 10.5 11.1(H)  RBC 3.87 - 5.11 Mil/uL 4.59 4.27 4.68  HGB 12.0 - 15.0 g/dL 12.4 11.8(L) 12.6  HCT 36.0 - 46.0 % 38.2 37.6 40.0  PLT 150.0 - 400.0 K/uL 391.0 375 327  NEUTROABS 1.4 - 7.7 K/uL 7.6 7.6 7.2  LYMPHSABS 0.7 - 4.0 K/uL 2.2 2.0 2.9     Body mass index is 37.11 kg/m.  Orders:  No orders of the defined types were placed in this encounter.  No orders of the defined types were placed in this encounter.    Procedures: No procedures performed  Clinical Data: No additional findings.  ROS:  All other systems negative, except as noted in the HPI. Review of Systems  Objective: Vital Signs: Ht 5\' 5"  (1.651 m)   Wt 223 lb (101.2 kg)   BMI 37.11 kg/m   Specialty Comments:  No specialty comments available.  PMFS History: Patient Active Problem List   Diagnosis Date Noted  .  Memory loss 11/21/2019  . Mild nonproliferative diabetic retinopathy of both eyes ([place]) 10/23/2019  . Nuclear sclerotic cataract of both eyes 10/23/2019  . Posterior vitreous detachment of right eye 10/23/2019  . Retinal hemorrhage of left eye 10/23/2019  . Hyperlipidemia associated with type 2 diabetes mellitus ([place]) 11/30/2017  . Impingement syndrome of right shoulder 07/06/2017  . Iron deficiency anemia 05/11/2017  . Morbid obesity ([place]) 04/24/2017  . Anemia 02/24/2017  . Baker's cyst 07/10/2016  . Onychomycosis 12/07/2015  . Upper airway cough syndrome 03/05/2014  . History of amputation of lesser toe of right foot ([place]) 04/15/2013  . Hypertension associated with diabetes ([place]) 12/05/2006  . Uncontrolled type 2  diabetes mellitus with peripheral circulatory disorder ([place]) 10/12/2006  . Dyslipidemia associated with type 2 diabetes mellitus ([place]) 10/12/2006  . Allergic rhinitis 10/12/2006  . GERD - Followed by Dr. Fuller Plan 10/12/2006  . IRRITABLE BOWEL SYNDROME - followed by Dr. Fuller Plan 10/12/2006  . Peripheral neuropathy 10/11/2006  . ALOPECIA NEC 10/11/2006   Past Medical History:  Diagnosis Date  . Alopecia   . Anemia, mild   . Arthritis   . Chronic cough    sees pulmonologist  . Chronic pain    chest wall and abd - s/p extensive eval  . Diabetes mellitus with neuropathy ([place])    sees endocrine  . Diverticulosis   . Fatty liver   . GERD (gastroesophageal reflux disease)    takes Nexium bid, hx erosive esophagitis  . Headache(784.0)    occasionally;r/t sinus   . History of colon polyps   . HTN (hypertension)   . Hx of amputation of lesser toe ([place])    sees podiatrist  . Hyperlipemia   . IBS (irritable bowel syndrome)   . Insomnia    takes Elavil nightly  . Joint pain   . Neuropathy   . Osteomyelitis ([place])   . Pneumonia   . PONV (postoperative nausea and vomiting)   . Seasonal allergies    takes Zyrtec daily  . Sinus tachycardia     Family History  Problem Relation Age of Onset  . Lung cancer Mother 28       smoked heavily  . Emphysema Mother   . Hypertension Father   . Hyperlipidemia Father   . Diabetes Father   . Coronary artery disease Father   . Dementia Father   . COPD Father        smoked  . Stomach cancer Paternal Aunt   . Brain cancer Paternal Uncle   . Irritable bowel syndrome Other        Several family members on fathers side   . Diabetes Other   . Stomach cancer Maternal Aunt   . Lung cancer Paternal Uncle   . Heart disease Paternal Uncle   . Colon cancer Neg Hx     Past Surgical History:  Procedure Laterality Date  . AMPUTATION  12/28/2011   Procedure: AMPUTATION DIGIT;  Surgeon: Newt Minion, MD;  Location: [facility];  Service: Orthopedics;  Laterality:  Right;  Right Foot 2nd Toe Amputation at MTP (metatarsophalangeal joint)  . AMPUTATION Right 04/25/2012   Procedure: Right Foot 3rd Toe Amputation;  Surgeon: Newt Minion, MD;  Location: [facility];  Service: Orthopedics;  Laterality: Right;  Right Foot Third Toe Amputation   . AMPUTATION Right 07/27/2012   Procedure: Right 4th Toe Amputation at Metatarsophalangeal;  Surgeon: Newt Minion, MD;  Location: [facility];  Service: Orthopedics;  Laterality: Right;  Right  4th Toe Amputation at Metatarsophalangeal  . AMPUTATION Left 07/15/2016   Procedure: Left 2nd Ray Amputation;  Surgeon: Newt Minion, MD;  Location: [facility];  Service: Orthopedics;  Laterality: Left;  . AMPUTATION Left 11/18/2016   Procedure: Left 3rd and 4th Ray Amputation;  Surgeon: Newt Minion, MD;  Location: [facility];  Service: Orthopedics;  Laterality: Left;  . AMPUTATION Right 03/29/2017   Procedure: RIGHT FOOT 3RD AND 4TH RAY AMPUTATION;  Surgeon: Newt Minion, MD;  Location: [facility];  Service: Orthopedics;  Laterality: Right;  . COLONOSCOPY    . LAPAROSCOPIC APPENDECTOMY  01/05/2011   Procedure: APPENDECTOMY LAPAROSCOPIC;  Surgeon: Pedro Earls, MD;  Location[facility];  Service: General;  Laterality: N/A;  . OOPHORECTOMY  2001  . ROTATOR CUFF REPAIR Right    x 2  . TUBAL LIGATION    . VAGINAL HYSTERECTOMY  2001   Social History   Occupational History  . Occupation: Retired   Tobacco Use  . Smoking status: Never Smoker  . Smokeless tobacco: Never Used  Vaping Use  . Vaping Use: Never used  Substance and Sexual Activity  . Alcohol use: No  . Drug use: No  . Sexual activity: Yes    Birth control/protection: Surgical

## 2019-12-23 NOTE — Telephone Encounter (Signed)
Please advise 

## 2019-12-23 NOTE — Telephone Encounter (Signed)
Ok to come in for labs if her dermatologist refuses to do them. Please place order for CBC, with differential, CMET, CRP and sed rate.  Beth Holden. Jerline Pain, MD 12/23/2019 4:47 PM

## 2019-12-24 ENCOUNTER — Telehealth: Payer: Self-pay | Admitting: Internal Medicine

## 2019-12-24 ENCOUNTER — Encounter: Payer: Self-pay | Admitting: Family Medicine

## 2019-12-24 ENCOUNTER — Other Ambulatory Visit: Payer: Self-pay

## 2019-12-24 ENCOUNTER — Other Ambulatory Visit: Payer: Medicare Other

## 2019-12-24 ENCOUNTER — Other Ambulatory Visit: Payer: Self-pay | Admitting: *Deleted

## 2019-12-24 DIAGNOSIS — R21 Rash and other nonspecific skin eruption: Secondary | ICD-10-CM

## 2019-12-24 NOTE — Telephone Encounter (Signed)
Pt aware lab placed, results received

## 2019-12-24 NOTE — Telephone Encounter (Signed)
Noted! Thank you

## 2019-12-24 NOTE — Telephone Encounter (Signed)
Patient is starting a new medication that makes her sugars high and she would like a call back to discuss. Ph# [phone]

## 2019-12-24 NOTE — Telephone Encounter (Signed)
Patient notified.  Lab placed.

## 2019-12-24 NOTE — Telephone Encounter (Signed)
Patient states that she is starting some medication today- she states she is breaking out all over- and doctor wanted her to start on 5 tablets 10 mg (50 mg) of prednisone for 14 days possibly longer- she just wants to know what she should do.   Thank you!

## 2019-12-24 NOTE — Telephone Encounter (Signed)
Beth Holden, I just sent her a message about increasing insulin dose.

## 2019-12-24 NOTE — Addendum Note (Signed)
Addended by: Liliane Channel on: 12/24/2019 01:56 PM   Modules accepted: Orders

## 2019-12-25 ENCOUNTER — Encounter: Payer: Self-pay | Admitting: Family Medicine

## 2019-12-25 LAB — CBC WITH DIFFERENTIAL/PLATELET
Absolute Monocytes: 169 cells/uL — ABNORMAL LOW (ref 200–950)
Basophils Absolute: 65 cells/uL (ref 0–200)
Basophils Relative: 0.5 %
Eosinophils Absolute: 130 cells/uL (ref 15–500)
Eosinophils Relative: 1 %
HCT: 36.3 % (ref 35.0–45.0)
Hemoglobin: 11.6 g/dL — ABNORMAL LOW (ref 11.7–15.5)
Lymphs Abs: 1313 cells/uL (ref 850–3900)
MCH: 26.9 pg — ABNORMAL LOW (ref 27.0–33.0)
MCHC: 32 g/dL (ref 32.0–36.0)
MCV: 84 fL (ref 80.0–100.0)
MPV: 9.8 fL (ref 7.5–12.5)
Monocytes Relative: 1.3 %
Neutro Abs: 11323 cells/uL — ABNORMAL HIGH (ref 1500–7800)
Neutrophils Relative %: 87.1 %
Platelets: 490 10*3/uL — ABNORMAL HIGH (ref 140–400)
RBC: 4.32 10*6/uL (ref 3.80–5.10)
RDW: 12.8 % (ref 11.0–15.0)
Total Lymphocyte: 10.1 %
WBC: 13 10*3/uL — ABNORMAL HIGH (ref 3.8–10.8)

## 2019-12-25 LAB — COMPREHENSIVE METABOLIC PANEL
AG Ratio: 1.2 (calc) (ref 1.0–2.5)
ALT: 7 U/L (ref 6–29)
AST: 15 U/L (ref 10–35)
Albumin: 4.2 g/dL (ref 3.6–5.1)
Alkaline phosphatase (APISO): 47 U/L (ref 37–153)
BUN/Creatinine Ratio: 18 (calc) (ref 6–22)
BUN: 17 mg/dL (ref 7–25)
CO2: 26 mmol/L (ref 20–32)
Calcium: 10.2 mg/dL (ref 8.6–10.4)
Chloride: 98 mmol/L (ref 98–110)
Creat: 0.97 mg/dL — ABNORMAL HIGH (ref 0.60–0.93)
Globulin: 3.6 g/dL (calc) (ref 1.9–3.7)
Glucose, Bld: 199 mg/dL — ABNORMAL HIGH (ref 65–99)
Potassium: 4.6 mmol/L (ref 3.5–5.3)
Sodium: 139 mmol/L (ref 135–146)
Total Bilirubin: 0.4 mg/dL (ref 0.2–1.2)
Total Protein: 7.8 g/dL (ref 6.1–8.1)

## 2019-12-25 LAB — C-REACTIVE PROTEIN: CRP: 13.2 mg/L — ABNORMAL HIGH (ref <8.0)

## 2019-12-25 LAB — SEDIMENTATION RATE: Sed Rate: 51 mm/h — ABNORMAL HIGH (ref 0–30)

## 2019-12-25 NOTE — Telephone Encounter (Signed)
See note

## 2019-12-25 NOTE — Telephone Encounter (Signed)
Please advise 

## 2019-12-25 NOTE — Progress Notes (Signed)
Please inform patient of the following:  Her blood work shows inflammation but everything else is stable. 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 The rash is due to the medication she was on and now that it has been stopped, she should have full recovery over the next week or so.  Would like for her to let us know if her rash is not improving as expected.  Beth Holden. Jerline Pain, MD 12/25/2019 8:12 AM

## 2019-12-26 ENCOUNTER — Encounter (HOSPITAL_COMMUNITY): Payer: Self-pay | Admitting: Emergency Medicine

## 2019-12-26 ENCOUNTER — Other Ambulatory Visit: Payer: Self-pay

## 2019-12-26 ENCOUNTER — Other Ambulatory Visit: Payer: Self-pay | Admitting: Family Medicine

## 2019-12-26 ENCOUNTER — Emergency Department (HOSPITAL_COMMUNITY)
Admission: EM | Admit: 2019-12-26 | Discharge: 2019-12-26 | Disposition: A | Discharge status: Home / Self Care | Payer: Medicare Other | Attending: Emergency Medicine | Admitting: Emergency Medicine

## 2019-12-26 ENCOUNTER — Telehealth: Payer: Self-pay | Admitting: *Deleted

## 2019-12-26 DIAGNOSIS — Z5321 Procedure and treatment not carried out due to patient leaving prior to being seen by health care provider: Secondary | ICD-10-CM | POA: Diagnosis not present

## 2019-12-26 DIAGNOSIS — R21 Rash and other nonspecific skin eruption: Secondary | ICD-10-CM | POA: Diagnosis not present

## 2019-12-26 LAB — COMPREHENSIVE METABOLIC PANEL
ALT: 11 U/L (ref 0–44)
AST: 13 U/L — ABNORMAL LOW (ref 15–41)
Albumin: 3.6 g/dL (ref 3.5–5.0)
Alkaline Phosphatase: 42 U/L (ref 38–126)
Anion gap: 14 (ref 5–15)
BUN: 25 mg/dL — ABNORMAL HIGH (ref 8–23)
CO2: 26 mmol/L (ref 22–32)
Calcium: 9.4 mg/dL (ref 8.9–10.3)
Chloride: 97 mmol/L — ABNORMAL LOW (ref 98–111)
Creatinine, Ser: 1.1 mg/dL — ABNORMAL HIGH (ref 0.44–1.00)
GFR, Estimated: 53 mL/min — ABNORMAL LOW (ref >60)
Glucose, Bld: 197 mg/dL — ABNORMAL HIGH (ref 70–99)
Potassium: 3.4 mmol/L — ABNORMAL LOW (ref 3.5–5.1)
Sodium: 137 mmol/L (ref 135–145)
Total Bilirubin: 0.6 mg/dL (ref 0.3–1.2)
Total Protein: 7.5 g/dL (ref 6.5–8.1)

## 2019-12-26 LAB — CBC WITH DIFFERENTIAL/PLATELET
Abs Immature Granulocytes: 0.07 10*3/uL (ref 0.00–0.07)
Basophils Absolute: 0.1 10*3/uL (ref 0.0–0.1)
Basophils Relative: 0 %
Eosinophils Absolute: 0.5 10*3/uL (ref 0.0–0.5)
Eosinophils Relative: 3 %
HCT: 35.4 % — ABNORMAL LOW (ref 36.0–46.0)
Hemoglobin: 11 g/dL — ABNORMAL LOW (ref 12.0–15.0)
Immature Granulocytes: 0 %
Lymphocytes Relative: 29 %
Lymphs Abs: 4.8 10*3/uL — ABNORMAL HIGH (ref 0.7–4.0)
MCH: 27.3 pg (ref 26.0–34.0)
MCHC: 31.1 g/dL (ref 30.0–36.0)
MCV: 87.8 fL (ref 80.0–100.0)
Monocytes Absolute: 0.9 10*3/uL (ref 0.1–1.0)
Monocytes Relative: 6 %
Neutro Abs: 10.3 10*3/uL — ABNORMAL HIGH (ref 1.7–7.7)
Neutrophils Relative %: 62 %
Platelets: 532 10*3/uL — ABNORMAL HIGH (ref 150–400)
RBC: 4.03 MIL/uL (ref 3.87–5.11)
RDW: 13.4 % (ref 11.5–15.5)
WBC: 16.6 10*3/uL — ABNORMAL HIGH (ref 4.0–10.5)
nRBC: 0 % (ref 0.0–0.2)

## 2019-12-26 NOTE — ED Triage Notes (Signed)
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

## 2019-12-26 NOTE — ED Notes (Signed)
Per registration staff, pt left  

## 2019-12-26 NOTE — Telephone Encounter (Signed)
Pt stated dermatology advise to take Prednisone 6 pills daily Have appointment on Monday with Dermatology for follow up.

## 2019-12-26 NOTE — Telephone Encounter (Signed)
Noted.  Algis Greenhouse. Jerline Pain, MD 12/26/2019 4:26 PM

## 2019-12-26 NOTE — Telephone Encounter (Signed)
Spoke with patient this morning  Pt stated husband thing is getting better, leg and arms still itching and seems lik is spreading on arms and hands  Advise to go to ER if sores worsen as Dr Jerline Pain recommend  Patient verbalized understanding

## 2019-12-28 ENCOUNTER — Other Ambulatory Visit: Payer: Self-pay | Admitting: Internal Medicine

## 2019-12-30 DIAGNOSIS — M310 Hypersensitivity angiitis: Secondary | ICD-10-CM | POA: Diagnosis not present

## 2019-12-31 ENCOUNTER — Encounter: Payer: Self-pay | Admitting: Internal Medicine

## 2020-01-06 ENCOUNTER — Encounter: Payer: Self-pay | Admitting: Internal Medicine

## 2020-01-06 DIAGNOSIS — M310 Hypersensitivity angiitis: Secondary | ICD-10-CM | POA: Diagnosis not present

## 2020-01-06 DIAGNOSIS — Z79899 Other long term (current) drug therapy: Secondary | ICD-10-CM | POA: Diagnosis not present

## 2020-01-13 ENCOUNTER — Ambulatory Visit (INDEPENDENT_AMBULATORY_CARE_PROVIDER_SITE_OTHER): Payer: Medicare Other | Admitting: Orthopedic Surgery

## 2020-01-13 ENCOUNTER — Encounter: Payer: Self-pay | Admitting: Orthopedic Surgery

## 2020-01-13 ENCOUNTER — Encounter: Payer: Self-pay | Admitting: Family Medicine

## 2020-01-13 ENCOUNTER — Encounter: Payer: Self-pay | Admitting: Internal Medicine

## 2020-01-13 ENCOUNTER — Other Ambulatory Visit: Payer: Self-pay

## 2020-01-13 VITALS — Ht 65.0 in | Wt 223.0 lb

## 2020-01-13 DIAGNOSIS — L97521 Non-pressure chronic ulcer of other part of left foot limited to breakdown of skin: Secondary | ICD-10-CM | POA: Diagnosis not present

## 2020-01-13 MED ORDER — METOPROLOL SUCCINATE ER 25 MG PO TB24: 25.0000 mg | ORAL_TABLET | Freq: Every day | ORAL | 3 refills | Status: DC

## 2020-01-13 MED ORDER — ROSUVASTATIN CALCIUM 20 MG PO TABS: 20.0000 mg | ORAL_TABLET | Freq: Every day | ORAL | 3 refills | Status: DC

## 2020-01-13 NOTE — Progress Notes (Signed)
Office Visit Note   [name]           Date of Birth: 07/14/1945           MRN: [id number] Visit Date: 01/13/2020              Requested by: Ardith Dark, MD [address] [place],  [place] [address] PCP: Ardith Dark, MD  Chief Complaint  Patient presents with  . Left Foot - Follow-up      HPI: Patient is a 75 year old woman who is seen in follow-up for ulceration plantar aspect left foot.  She is also status post developing a vasculitis which she has started prednisone for.  Patient states the ulcers appear to be drying up and they involve her lower extremities as well as the trunk.  Assessment & Plan: Visit Diagnoses:  1. Ulcer of left foot, limited to breakdown of skin (HCC)     Plan: The ulcer of the left foot was debrided of skin and soft tissue she will continue with her protective shoe wear again recommended that she wear the compression stockings to help with resolution of the ulcers improved circulation and promote debriding.  Follow-Up Instructions: Return in about 2 weeks (around 01/27/2020).   Ortho Exam  Patient is alert, oriented, no adenopathy, well-dressed, normal affect, normal respiratory effort. Examination the vasculitic ulcers are drying up and improving.  The ulcer on the plantar aspect of the rocker-bottom deformity Charcot collapse of the left foot has gotten larger.  After informed consent a 10 blade knife was used to debride the skin and soft tissue.  The initial wound was 10 mm in diameter after debridement the wound is 5 cm in diameter and 5 mm deep silver nitrate was used hemostasis there is no exposed bone or tendon.  A sterile dressing was applied.  Imaging: No results found. No images are attached to the encounter.  Labs: Lab Results  Component Value Date   HGBA1C 8.2 (A) 12/19/2019   HGBA1C 7.4 (A) 11/23/2018   HGBA1C 7.4 (A) 07/10/2018   ESRSEDRATE 51 (H) 12/24/2019   ESRSEDRATE 36 (H) 02/24/2017   CRP 13.2 (H)  12/24/2019   CRP 1.2 (H) 02/24/2017   LABURIC 5.5 09/01/2015   LABORGA GROUP B STREP (S.AGALACTIAE) ISOLATED 12/26/2014     Lab Results  Component Value Date   ALBUMIN 3.6 12/26/2019   ALBUMIN 4.0 12/19/2019   ALBUMIN 3.8 05/19/2017   PREALBUMIN 19.4 03/05/2012   LABURIC 5.5 09/01/2015    No results found for: MG No results found for: VD25OH  Lab Results  Component Value Date   PREALBUMIN 19.4 03/05/2012   CBC EXTENDED Latest Ref Rng & Units 12/26/2019 12/24/2019 09/25/2019  WBC 4.0 - 10.5 K/uL 16.6(H) 13.0(H) 10.7(H)  RBC 3.87 - 5.11 MIL/uL 4.03 4.32 4.59  HGB 12.0 - 15.0 g/dL 11.0(L) 11.6(L) 12.4  HCT 36.0 - 46.0 % 35.4(L) 36.3 38.2  PLT 150 - 400 K/uL 532(H) 490(H) 391.0  NEUTROABS 1.7 - 7.7 K/uL 10.3(H) 11,323(H) 7.6  LYMPHSABS 0.7 - 4.0 K/uL 4.8(H) 1,313 2.2     Body mass index is 37.11 kg/m.  Orders:  No orders of the defined types were placed in this encounter.  No orders of the defined types were placed in this encounter.    Procedures: No procedures performed  Clinical Data: No additional findings.  ROS:  All other systems negative, except as noted in the HPI. Review of Systems  Objective: Vital Signs: Ht 5'  5" (1.651 m)   Wt 223 lb (101.2 kg)   BMI 37.11 kg/m   Specialty Comments:  No specialty comments available.  PMFS History: Patient Active Problem List   Diagnosis Date Noted  . Memory loss 11/21/2019  . Mild nonproliferative diabetic retinopathy of both eyes ([place]) 10/23/2019  . Nuclear sclerotic cataract of both eyes 10/23/2019  . Posterior vitreous detachment of right eye 10/23/2019  . Retinal hemorrhage of left eye 10/23/2019  . Hyperlipidemia associated with type 2 diabetes mellitus ([place]) 11/30/2017  . Impingement syndrome of right shoulder 07/06/2017  . Iron deficiency anemia 05/11/2017  . Morbid obesity ([place]) 04/24/2017  . Anemia 02/24/2017  . Baker's cyst 07/10/2016  . Onychomycosis 12/07/2015  . Upper airway cough  syndrome 03/05/2014  . History of amputation of lesser toe of right foot ([place]) 04/15/2013  . Hypertension associated with diabetes ([place]) 12/05/2006  . Uncontrolled type 2 diabetes mellitus with peripheral circulatory disorder ([facility]) 10/12/2006  . Dyslipidemia associated with type 2 diabetes mellitus ([place]) 10/12/2006  . Allergic rhinitis 10/12/2006  . GERD - Followed by Dr. Fuller Plan 10/12/2006  . IRRITABLE BOWEL SYNDROME - followed by Dr. Fuller Plan 10/12/2006  . Peripheral neuropathy 10/11/2006  . ALOPECIA NEC 10/11/2006   Past Medical History:  Diagnosis Date  . Alopecia   . Anemia, mild   . Arthritis   . Chronic cough    sees pulmonologist  . Chronic pain    chest wall and abd - s/p extensive eval  . Diabetes mellitus with neuropathy ([place])    sees endocrine  . Diverticulosis   . Fatty liver   . GERD (gastroesophageal reflux disease)    takes Nexium bid, hx erosive esophagitis  . Headache(784.0)    occasionally;r/t sinus   . History of colon polyps   . HTN (hypertension)   . Hx of amputation of lesser toe ([place])    sees podiatrist  . Hyperlipemia   . IBS (irritable bowel syndrome)   . Insomnia    takes Elavil nightly  . Joint pain   . Neuropathy   . Osteomyelitis ([place])   . Pneumonia   . PONV (postoperative nausea and vomiting)   . Seasonal allergies    takes Zyrtec daily  . Sinus tachycardia     Family History  Problem Relation Age of Onset  . Lung cancer Mother 74       smoked heavily  . Emphysema Mother   . Hypertension Father   . Hyperlipidemia Father   . Diabetes Father   . Coronary artery disease Father   . Dementia Father   . COPD Father        smoked  . Stomach cancer Paternal Aunt   . Brain cancer Paternal Uncle   . Irritable bowel syndrome Other        Several family members on fathers side   . Diabetes Other   . Stomach cancer Maternal Aunt   . Lung cancer Paternal Uncle   . Heart disease Paternal Uncle   . Colon cancer Neg Hx     Past Surgical  History:  Procedure Laterality Date  . AMPUTATION  12/28/2011   Procedure: AMPUTATION DIGIT;  Surgeon: Newt Minion, MD;  Location: [facility];  Service: Orthopedics;  Laterality: Right;  Right Foot 2nd Toe Amputation at MTP (metatarsophalangeal joint)  . AMPUTATION Right 04/25/2012   Procedure: Right Foot 3rd Toe Amputation;  Surgeon: Newt Minion, MD;  Location: [facility];  Service: Orthopedics;  Laterality: Right;  Right Foot Third Toe Amputation   . AMPUTATION Right 07/27/2012   Procedure: Right 4th Toe Amputation at Metatarsophalangeal;  Surgeon: Newt Minion, MD;  Location: [facility];  Service: Orthopedics;  Laterality: Right;  Right 4th Toe Amputation at Metatarsophalangeal  . AMPUTATION Left 07/15/2016   Procedure: Left 2nd Ray Amputation;  Surgeon: Newt Minion, MD;  Location: [facility];  Service: Orthopedics;  Laterality: Left;  . AMPUTATION Left 11/18/2016   Procedure: Left 3rd and 4th Ray Amputation;  Surgeon: Newt Minion, MD;  Location: [facility];  Service: Orthopedics;  Laterality: Left;  . AMPUTATION Right 03/29/2017   Procedure: RIGHT FOOT 3RD AND 4TH RAY AMPUTATION;  Surgeon: Newt Minion, MD;  Location: [facility];  Service: Orthopedics;  Laterality: Right;  . COLONOSCOPY    . LAPAROSCOPIC APPENDECTOMY  01/05/2011   Procedure: APPENDECTOMY LAPAROSCOPIC;  Surgeon: Pedro Earls, MD;  [facility];  Service: General;  Laterality: N/A;  . OOPHORECTOMY  2001  . ROTATOR CUFF REPAIR Right    x 2  . TUBAL LIGATION    . VAGINAL HYSTERECTOMY  2001   Social History   Occupational History  . Occupation: Retired   Tobacco Use  . Smoking status: Never Smoker  . Smokeless tobacco: Never Used  Vaping Use  . Vaping Use: Never used  Substance and Sexual Activity  . Alcohol use: No  . Drug use: No  . Sexual activity: Yes    Birth control/protection: Surgical

## 2020-01-14 ENCOUNTER — Other Ambulatory Visit: Payer: Self-pay | Admitting: Internal Medicine

## 2020-01-14 ENCOUNTER — Other Ambulatory Visit: Payer: Self-pay | Admitting: *Deleted

## 2020-01-14 ENCOUNTER — Other Ambulatory Visit: Payer: Self-pay

## 2020-01-14 DIAGNOSIS — E1151 Type 2 diabetes mellitus with diabetic peripheral angiopathy without gangrene: Secondary | ICD-10-CM

## 2020-01-14 DIAGNOSIS — IMO0002 Reserved for concepts with insufficient information to code with codable children: Secondary | ICD-10-CM

## 2020-01-14 MED ORDER — IRBESARTAN-HYDROCHLOROTHIAZIDE 150-12.5 MG PO TABS: 1.0000 | ORAL_TABLET | Freq: Every day | ORAL | 3 refills | Status: DC

## 2020-01-14 MED ORDER — OMEPRAZOLE 40 MG PO CPDR: 40.0000 mg | DELAYED_RELEASE_CAPSULE | Freq: Two times a day (BID) | ORAL | 3 refills | Status: DC

## 2020-01-14 MED ORDER — METFORMIN HCL 1000 MG PO TABS: 1000.0000 mg | ORAL_TABLET | Freq: Two times a day (BID) | ORAL | 3 refills | Status: DC

## 2020-01-14 MED ORDER — GABAPENTIN 100 MG PO CAPS: ORAL_CAPSULE | ORAL | 3 refills | Status: DC

## 2020-01-15 ENCOUNTER — Other Ambulatory Visit: Payer: Self-pay | Admitting: Family Medicine

## 2020-01-16 ENCOUNTER — Other Ambulatory Visit: Payer: Self-pay

## 2020-01-16 MED ORDER — ROSUVASTATIN CALCIUM 20 MG PO TABS: 20.0000 mg | ORAL_TABLET | Freq: Every day | ORAL | 3 refills | Status: DC

## 2020-01-16 MED ORDER — METOPROLOL SUCCINATE ER 25 MG PO TB24: 25.0000 mg | ORAL_TABLET | Freq: Every day | ORAL | 3 refills | Status: DC

## 2020-01-29 DIAGNOSIS — M310 Hypersensitivity angiitis: Secondary | ICD-10-CM | POA: Diagnosis not present

## 2020-01-29 DIAGNOSIS — Z79899 Other long term (current) drug therapy: Secondary | ICD-10-CM | POA: Diagnosis not present

## 2020-01-29 DIAGNOSIS — L959 Vasculitis limited to the skin, unspecified: Secondary | ICD-10-CM | POA: Diagnosis not present

## 2020-01-30 ENCOUNTER — Ambulatory Visit: Payer: Medicare Other | Admitting: Orthopedic Surgery

## 2020-01-31 ENCOUNTER — Other Ambulatory Visit: Payer: Self-pay | Admitting: Internal Medicine

## 2020-02-03 ENCOUNTER — Ambulatory Visit (INDEPENDENT_AMBULATORY_CARE_PROVIDER_SITE_OTHER): Payer: Medicare Other | Admitting: Orthopedic Surgery

## 2020-02-03 ENCOUNTER — Encounter: Payer: Self-pay | Admitting: Orthopedic Surgery

## 2020-02-03 VITALS — Ht 65.0 in | Wt 223.0 lb

## 2020-02-03 DIAGNOSIS — L97521 Non-pressure chronic ulcer of other part of left foot limited to breakdown of skin: Secondary | ICD-10-CM | POA: Diagnosis not present

## 2020-02-03 DIAGNOSIS — R21 Rash and other nonspecific skin eruption: Secondary | ICD-10-CM | POA: Diagnosis not present

## 2020-02-03 DIAGNOSIS — M14672 Charcot's joint, left ankle and foot: Secondary | ICD-10-CM | POA: Diagnosis not present

## 2020-02-03 NOTE — Progress Notes (Signed)
Office Visit Note   [name]           Date of Birth: 1945/04/24           MRN: [id number] Visit Date: 02/03/2020              Requested by: Vivi Barrack, MD [address] [place],  [place] [address] PCP: Vivi Barrack, MD  Chief Complaint  Patient presents with  . Left Foot - Follow-up      HPI: Patient is a 75 year old woman who presents in follow-up for the [facility] grade 1 ulcer left foot with Charcot collapse.  Patient states that her systemic rash is improving she states that it still not clear the etiology of the rash.  Assessment & Plan: Visit Diagnoses:  1. Ulcer of left foot, limited to breakdown of skin ([place])   2. Charcot's joint, left ankle and foot   3. Rash and other nonspecific skin eruption     Plan: Patient will use a dry dressing on the left foot wound continue with her current treatment with the rash.  Follow-Up Instructions: Return in about 4 weeks (around 03/02/2020).   Ortho Exam  Patient is alert, oriented, no adenopathy, well-dressed, normal affect, normal respiratory effort. Examination the rash is improving there is no cellulitis in the left foot there is some maceration around the wound.  Her Charcot collapse is stable no evidence of infection no evidence of active Charcot collapse.  After informed consent a 10 blade knife was used to debride the skin and soft tissue back to healthy viable granulation tissue.  Silver nitrate was used hemostasis the ulcer was 1 cm diameter prior to debridement after debridement the ulcer was 2 cm in diameter 3 mm deep with healthy granulation tissue no exposed bone or tendon.  Imaging: No results found. No images are attached to the encounter.  Labs: Lab Results  Component Value Date   HGBA1C 8.2 (A) 12/19/2019   HGBA1C 7.4 (A) 11/23/2018   HGBA1C 7.4 (A) 07/10/2018   ESRSEDRATE 51 (H) 12/24/2019   ESRSEDRATE 36 (H) 02/24/2017   CRP 13.2 (H) 12/24/2019   CRP 1.2 (H) 02/24/2017   LABURIC 5.5  09/01/2015   LABORGA GROUP B STREP (S.AGALACTIAE) ISOLATED 12/26/2014     Lab Results  Component Value Date   ALBUMIN 3.6 12/26/2019   ALBUMIN 4.0 12/19/2019   ALBUMIN 3.8 05/19/2017   PREALBUMIN 19.4 03/05/2012   LABURIC 5.5 09/01/2015    No results found for: MG No results found for: VD25OH  Lab Results  Component Value Date   PREALBUMIN 19.4 03/05/2012   CBC EXTENDED Latest Ref Rng & Units 12/26/2019 12/24/2019 09/25/2019  WBC 4.0 - 10.5 K/uL 16.6(H) 13.0(H) 10.7(H)  RBC 3.87 - 5.11 MIL/uL 4.03 4.32 4.59  HGB 12.0 - 15.0 g/dL 11.0(L) 11.6(L) 12.4  HCT 36.0 - 46.0 % 35.4(L) 36.3 38.2  PLT 150 - 400 K/uL 532(H) 490(H) 391.0  NEUTROABS 1.7 - 7.7 K/uL 10.3(H) 11,323(H) 7.6  LYMPHSABS 0.7 - 4.0 K/uL 4.8(H) 1,313 2.2     Body mass index is 37.11 kg/m.  Orders:  No orders of the defined types were placed in this encounter.  No orders of the defined types were placed in this encounter.    Procedures: No procedures performed  Clinical Data: No additional findings.  ROS:  All other systems negative, except as noted in the HPI. Review of Systems  Objective: Vital Signs: Ht 5\' 5"  (1.651 m)   Wt  223 lb (101.2 kg)   BMI 37.11 kg/m   Specialty Comments:  No specialty comments available.  PMFS History: Patient Active Problem List   Diagnosis Date Noted  . Memory loss 11/21/2019  . Mild nonproliferative diabetic retinopathy of both eyes ([place]) 10/23/2019  . Nuclear sclerotic cataract of both eyes 10/23/2019  . Posterior vitreous detachment of right eye 10/23/2019  . Retinal hemorrhage of left eye 10/23/2019  . Hyperlipidemia associated with type 2 diabetes mellitus ([place]) 11/30/2017  . Impingement syndrome of right shoulder 07/06/2017  . Iron deficiency anemia 05/11/2017  . Morbid obesity ([place]) 04/24/2017  . Anemia 02/24/2017  . Baker's cyst 07/10/2016  . Onychomycosis 12/07/2015  . Upper airway cough syndrome 03/05/2014  . History of amputation of lesser  toe of right foot ([place]) 04/15/2013  . Hypertension associated with diabetes ([place]) 12/05/2006  . Uncontrolled type 2 diabetes mellitus with peripheral circulatory disorder ([place]) 10/12/2006  . Dyslipidemia associated with type 2 diabetes mellitus ([place]) 10/12/2006  . Allergic rhinitis 10/12/2006  . GERD - Followed by Dr. Fuller Plan 10/12/2006  . IRRITABLE BOWEL SYNDROME - followed by Dr. Fuller Plan 10/12/2006  . Peripheral neuropathy 10/11/2006  . ALOPECIA NEC 10/11/2006   Past Medical History:  Diagnosis Date  . Alopecia   . Anemia, mild   . Arthritis   . Chronic cough    sees pulmonologist  . Chronic pain    chest wall and abd - s/p extensive eval  . Diabetes mellitus with neuropathy ([place])    sees endocrine  . Diverticulosis   . Fatty liver   . GERD (gastroesophageal reflux disease)    takes Nexium bid, hx erosive esophagitis  . Headache(784.0)    occasionally;r/t sinus   . History of colon polyps   . HTN (hypertension)   . Hx of amputation of lesser toe ([place])    sees podiatrist  . Hyperlipemia   . IBS (irritable bowel syndrome)   . Insomnia    takes Elavil nightly  . Joint pain   . Neuropathy   . Osteomyelitis ([place])   . Pneumonia   . PONV (postoperative nausea and vomiting)   . Seasonal allergies    takes Zyrtec daily  . Sinus tachycardia     Family History  Problem Relation Age of Onset  . Lung cancer Mother 47       smoked heavily  . Emphysema Mother   . Hypertension Father   . Hyperlipidemia Father   . Diabetes Father   . Coronary artery disease Father   . Dementia Father   . COPD Father        smoked  . Stomach cancer Paternal Aunt   . Brain cancer Paternal Uncle   . Irritable bowel syndrome Other        Several family members on fathers side   . Diabetes Other   . Stomach cancer Maternal Aunt   . Lung cancer Paternal Uncle   . Heart disease Paternal Uncle   . Colon cancer Neg Hx     Past Surgical History:  Procedure Laterality Date  . AMPUTATION   12/28/2011   Procedure: AMPUTATION DIGIT;  Surgeon: Newt Minion, MD;  Location: [facility];  Service: Orthopedics;  Laterality: Right;  Right Foot 2nd Toe Amputation at MTP (metatarsophalangeal joint)  . AMPUTATION Right 04/25/2012   Procedure: Right Foot 3rd Toe Amputation;  Surgeon: Newt Minion, MD;  Location: [facility];  Service: Orthopedics;  Laterality: Right;  Right Foot Third Toe Amputation   .  AMPUTATION Right 07/27/2012   Procedure: Right 4th Toe Amputation at Metatarsophalangeal;  Surgeon: Newt Minion, MD;  Location: [place] [facility];  Service: Orthopedics;  Laterality: Right;  Right 4th Toe Amputation at Metatarsophalangeal  . AMPUTATION Left 07/15/2016   Procedure: Left 2nd Ray Amputation;  Surgeon: Newt Minion, MD;  Location: [facility];  Service: Orthopedics;  Laterality: Left;  . AMPUTATION Left 11/18/2016   Procedure: Left 3rd and 4th Ray Amputation;  Surgeon: Newt Minion, MD;  Location: [place];  Service: Orthopedics;  Laterality: Left;  . AMPUTATION Right 03/29/2017   Procedure: RIGHT FOOT 3RD AND 4TH RAY AMPUTATION;  Surgeon: Newt Minion, MD;  Location: [facility];  Service: Orthopedics;  Laterality: Right;  . COLONOSCOPY    . LAPAROSCOPIC APPENDECTOMY  01/05/2011   Procedure: APPENDECTOMY LAPAROSCOPIC;  Surgeon: Pedro Earls, MD;  Location[facility];  Service: General;  Laterality: N/A;  . OOPHORECTOMY  2001  . ROTATOR CUFF REPAIR Right    x 2  . TUBAL LIGATION    . VAGINAL HYSTERECTOMY  2001   Social History   Occupational History  . Occupation: Retired   Tobacco Use  . Smoking status: Never Smoker  . Smokeless tobacco: Never Used  Vaping Use  . Vaping Use: Never used  Substance and Sexual Activity  . Alcohol use: No  . Drug use: No  . Sexual activity: Yes    Birth control/protection: Surgical

## 2020-02-07 DIAGNOSIS — H0015 Chalazion left lower eyelid: Secondary | ICD-10-CM | POA: Diagnosis not present

## 2020-02-12 DIAGNOSIS — M310 Hypersensitivity angiitis: Secondary | ICD-10-CM | POA: Diagnosis not present

## 2020-02-12 DIAGNOSIS — Z79899 Other long term (current) drug therapy: Secondary | ICD-10-CM | POA: Diagnosis not present

## 2020-02-17 DIAGNOSIS — E113293 Type 2 diabetes mellitus with mild nonproliferative diabetic retinopathy without macular edema, bilateral: Secondary | ICD-10-CM | POA: Diagnosis not present

## 2020-02-17 DIAGNOSIS — Z794 Long term (current) use of insulin: Secondary | ICD-10-CM | POA: Diagnosis not present

## 2020-02-17 DIAGNOSIS — H43811 Vitreous degeneration, right eye: Secondary | ICD-10-CM | POA: Diagnosis not present

## 2020-02-17 DIAGNOSIS — H25813 Combined forms of age-related cataract, bilateral: Secondary | ICD-10-CM | POA: Diagnosis not present

## 2020-02-17 DIAGNOSIS — H0015 Chalazion left lower eyelid: Secondary | ICD-10-CM | POA: Diagnosis not present

## 2020-02-19 ENCOUNTER — Telehealth: Payer: Self-pay | Admitting: Family Medicine

## 2020-02-19 NOTE — Telephone Encounter (Signed)
Patient does not want to participate in an AWV

## 2020-02-19 NOTE — Telephone Encounter (Signed)
Left message for patient to call back and schedule Medicare Annual Wellness Visit (AWV) either virtually OR in office.   Last AWV 11/13/18; please schedule at anytime with [facility]Nurse Health Advisor at [facility].  This should be a 45 minute visit.

## 2020-02-25 ENCOUNTER — Ambulatory Visit (INDEPENDENT_AMBULATORY_CARE_PROVIDER_SITE_OTHER): Payer: Medicare Other

## 2020-02-25 ENCOUNTER — Ambulatory Visit (INDEPENDENT_AMBULATORY_CARE_PROVIDER_SITE_OTHER): Payer: Medicare Other | Admitting: Orthopedic Surgery

## 2020-02-25 DIAGNOSIS — L97521 Non-pressure chronic ulcer of other part of left foot limited to breakdown of skin: Secondary | ICD-10-CM

## 2020-02-25 DIAGNOSIS — M79672 Pain in left foot: Secondary | ICD-10-CM

## 2020-02-26 DIAGNOSIS — Z79899 Other long term (current) drug therapy: Secondary | ICD-10-CM | POA: Diagnosis not present

## 2020-02-26 DIAGNOSIS — M310 Hypersensitivity angiitis: Secondary | ICD-10-CM | POA: Diagnosis not present

## 2020-02-28 ENCOUNTER — Encounter: Payer: Self-pay | Admitting: Orthopedic Surgery

## 2020-02-28 NOTE — Progress Notes (Signed)
Office Visit Note   [name]           Date of Birth: Dec 25, 1945           MRN: [id number] Visit Date: 02/25/2020              Requested by: Vivi Barrack, MD [address] [place],  [place] [address] PCP: Vivi Barrack, MD  Chief Complaint  Patient presents with  . Left Foot - Pain      HPI: Patient is a 75 year old woman who presents with recurrent ulcer beneath the left Charcot collapse midfoot.  She states she noticed an odor this weekend with increased drainage and pain.  Assessment & Plan: Visit Diagnoses:  1. Pain in left foot   2. Ulcer of left foot, limited to breakdown of skin ([place])     Plan: Continue with pressure offloading dry dressing changes no signs of infection.  Follow-Up Instructions: Return in about 3 weeks (around 03/17/2020).   Ortho Exam  Patient is alert, oriented, no adenopathy, well-dressed, normal affect, normal respiratory effort. Examination patient's foot has a Charcot rocker-bottom deformity but no redness no cellulitis.  The plantar wound was 1 cm diameter.  After informed consent a 10 blade knife was used to debride the skin and soft tissue back to healthy viable granulation tissue after debridement the ulcer is 2 cm in diameter 3 mm deep silver nitrate was used for hemostasis the ulcer does not have any tunneling or probe to bone or tendon.  Imaging: No results found. No images are attached to the encounter.  Labs: Lab Results  Component Value Date   HGBA1C 8.2 (A) 12/19/2019   HGBA1C 7.4 (A) 11/23/2018   HGBA1C 7.4 (A) 07/10/2018   ESRSEDRATE 51 (H) 12/24/2019   ESRSEDRATE 36 (H) 02/24/2017   CRP 13.2 (H) 12/24/2019   CRP 1.2 (H) 02/24/2017   LABURIC 5.5 09/01/2015   LABORGA GROUP B STREP (S.AGALACTIAE) ISOLATED 12/26/2014     Lab Results  Component Value Date   ALBUMIN 3.6 12/26/2019   ALBUMIN 4.0 12/19/2019   ALBUMIN 3.8 05/19/2017   PREALBUMIN 19.4 03/05/2012   LABURIC 5.5 09/01/2015    No results  found for: MG No results found for: VD25OH  Lab Results  Component Value Date   PREALBUMIN 19.4 03/05/2012   CBC EXTENDED Latest Ref Rng & Units 12/26/2019 12/24/2019 09/25/2019  WBC 4.0 - 10.5 K/uL 16.6(H) 13.0(H) 10.7(H)  RBC 3.87 - 5.11 MIL/uL 4.03 4.32 4.59  HGB 12.0 - 15.0 g/dL 11.0(L) 11.6(L) 12.4  HCT 36.0 - 46.0 % 35.4(L) 36.3 38.2  PLT 150 - 400 K/uL 532(H) 490(H) 391.0  NEUTROABS 1.7 - 7.7 K/uL 10.3(H) 11,323(H) 7.6  LYMPHSABS 0.7 - 4.0 K/uL 4.8(H) 1,313 2.2     There is no height or weight on file to calculate BMI.  Orders:  Orders Placed This Encounter  Procedures  . XR Foot 2 Views Left   No orders of the defined types were placed in this encounter.    Procedures: No procedures performed  Clinical Data: No additional findings.  ROS:  All other systems negative, except as noted in the HPI. Review of Systems  Objective: Vital Signs: There were no vitals taken for this visit.  Specialty Comments:  No specialty comments available.  PMFS History: Patient Active Problem List   Diagnosis Date Noted  . Memory loss 11/21/2019  . Mild nonproliferative diabetic retinopathy of both eyes ([place]) 10/23/2019  . Nuclear sclerotic cataract of  both eyes 10/23/2019  . Posterior vitreous detachment of right eye 10/23/2019  . Retinal hemorrhage of left eye 10/23/2019  . Hyperlipidemia associated with type 2 diabetes mellitus ([place]) 11/30/2017  . Impingement syndrome of right shoulder 07/06/2017  . Iron deficiency anemia 05/11/2017  . Morbid obesity ([place]) 04/24/2017  . Anemia 02/24/2017  . Baker's cyst 07/10/2016  . Onychomycosis 12/07/2015  . Upper airway cough syndrome 03/05/2014  . History of amputation of lesser toe of right foot ([place]) 04/15/2013  . Hypertension associated with diabetes ([place]) 12/05/2006  . Uncontrolled type 2 diabetes mellitus with peripheral circulatory disorder ([place]) 10/12/2006  . Dyslipidemia associated with type 2 diabetes mellitus ([place])  10/12/2006  . Allergic rhinitis 10/12/2006  . GERD - Followed by Dr. Fuller Plan 10/12/2006  . IRRITABLE BOWEL SYNDROME - followed by Dr. Fuller Plan 10/12/2006  . Peripheral neuropathy 10/11/2006  . ALOPECIA NEC 10/11/2006   Past Medical History:  Diagnosis Date  . Alopecia   . Anemia, mild   . Arthritis   . Chronic cough    sees pulmonologist  . Chronic pain    chest wall and abd - s/p extensive eval  . Diabetes mellitus with neuropathy ([place])    sees endocrine  . Diverticulosis   . Fatty liver   . GERD (gastroesophageal reflux disease)    takes Nexium bid, hx erosive esophagitis  . Headache(784.0)    occasionally;r/t sinus   . History of colon polyps   . HTN (hypertension)   . Hx of amputation of lesser toe ([place])    sees podiatrist  . Hyperlipemia   . IBS (irritable bowel syndrome)   . Insomnia    takes Elavil nightly  . Joint pain   . Neuropathy   . Osteomyelitis ([place])   . Pneumonia   . PONV (postoperative nausea and vomiting)   . Seasonal allergies    takes Zyrtec daily  . Sinus tachycardia     Family History  Problem Relation Age of Onset  . Lung cancer Mother 78       smoked heavily  . Emphysema Mother   . Hypertension Father   . Hyperlipidemia Father   . Diabetes Father   . Coronary artery disease Father   . Dementia Father   . COPD Father        smoked  . Stomach cancer Paternal Aunt   . Brain cancer Paternal Uncle   . Irritable bowel syndrome Other        Several family members on fathers side   . Diabetes Other   . Stomach cancer Maternal Aunt   . Lung cancer Paternal Uncle   . Heart disease Paternal Uncle   . Colon cancer Neg Hx     Past Surgical History:  Procedure Laterality Date  . AMPUTATION  12/28/2011   Procedure: AMPUTATION DIGIT;  Surgeon: Newt Minion, MD;  Location: [facility];  Service: Orthopedics;  Laterality: Right;  Right Foot 2nd Toe Amputation at MTP (metatarsophalangeal joint)  . AMPUTATION Right 04/25/2012   Procedure: Right Foot 3rd  Toe Amputation;  Surgeon: Newt Minion, MD;  Location: [facility];  Service: Orthopedics;  Laterality: Right;  Right Foot Third Toe Amputation   . AMPUTATION Right 07/27/2012   Procedure: Right 4th Toe Amputation at Metatarsophalangeal;  Surgeon: Newt Minion, MD;  Location: [facility];  Service: Orthopedics;  Laterality: Right;  Right 4th Toe Amputation at Metatarsophalangeal  . AMPUTATION Left 07/15/2016   Procedure: Left 2nd Ray Amputation;  Surgeon: Sharol Given,  Illene Regulus, MD;  Location: [facility];  Service: Orthopedics;  Laterality: Left;  . AMPUTATION Left 11/18/2016   Procedure: Left 3rd and 4th Ray Amputation;  Surgeon: Newt Minion, MD;  Location: [facility];  Service: Orthopedics;  Laterality: Left;  . AMPUTATION Right 03/29/2017   Procedure: RIGHT FOOT 3RD AND 4TH RAY AMPUTATION;  Surgeon: Newt Minion, MD;  Location: [facility];  Service: Orthopedics;  Laterality: Right;  . COLONOSCOPY    . LAPAROSCOPIC APPENDECTOMY  01/05/2011   Procedure: APPENDECTOMY LAPAROSCOPIC;  Surgeon: Pedro Earls, MD;  [facility];  Service: General;  Laterality: N/A;  . OOPHORECTOMY  2001  . ROTATOR CUFF REPAIR Right    x 2  . TUBAL LIGATION    . VAGINAL HYSTERECTOMY  2001   Social History   Occupational History  . Occupation: Retired   Tobacco Use  . Smoking status: Never Smoker  . Smokeless tobacco: Never Used  Vaping Use  . Vaping Use: Never used  Substance and Sexual Activity  . Alcohol use: No  . Drug use: No  . Sexual activity: Yes    Birth control/protection: Surgical

## 2020-03-04 ENCOUNTER — Ambulatory Visit: Payer: Medicare Other | Admitting: Physician Assistant

## 2020-03-05 ENCOUNTER — Inpatient Hospital Stay (HOSPITAL_BASED_OUTPATIENT_CLINIC_OR_DEPARTMENT_OTHER): Payer: Medicare Other | Admitting: Nurse Practitioner

## 2020-03-05 ENCOUNTER — Other Ambulatory Visit: Payer: Self-pay

## 2020-03-05 ENCOUNTER — Ambulatory Visit: Payer: Medicare Other | Admitting: Orthopedic Surgery

## 2020-03-05 ENCOUNTER — Encounter: Payer: Self-pay | Admitting: Nurse Practitioner

## 2020-03-05 ENCOUNTER — Telehealth: Payer: Self-pay | Admitting: Nurse Practitioner

## 2020-03-05 ENCOUNTER — Inpatient Hospital Stay: Payer: Medicare Other | Attending: Nurse Practitioner

## 2020-03-05 VITALS — BP 122/55 | HR 86 | Temp 98.7°F | Resp 17 | Ht 65.0 in | Wt 238.7 lb

## 2020-03-05 DIAGNOSIS — I10 Essential (primary) hypertension: Secondary | ICD-10-CM | POA: Insufficient documentation

## 2020-03-05 DIAGNOSIS — D509 Iron deficiency anemia, unspecified: Secondary | ICD-10-CM | POA: Insufficient documentation

## 2020-03-05 DIAGNOSIS — E119 Type 2 diabetes mellitus without complications: Secondary | ICD-10-CM | POA: Diagnosis not present

## 2020-03-05 LAB — CBC WITH DIFFERENTIAL (CANCER CENTER ONLY)
Abs Immature Granulocytes: 0.07 10*3/uL (ref 0.00–0.07)
Basophils Absolute: 0.1 10*3/uL (ref 0.0–0.1)
Basophils Relative: 1 %
Eosinophils Absolute: 0.2 10*3/uL (ref 0.0–0.5)
Eosinophils Relative: 2 %
HCT: 36.2 % (ref 36.0–46.0)
Hemoglobin: 11.3 g/dL — ABNORMAL LOW (ref 12.0–15.0)
Immature Granulocytes: 1 %
Lymphocytes Relative: 22 %
Lymphs Abs: 3 10*3/uL (ref 0.7–4.0)
MCH: 26.7 pg (ref 26.0–34.0)
MCHC: 31.2 g/dL (ref 30.0–36.0)
MCV: 85.4 fL (ref 80.0–100.0)
Monocytes Absolute: 0.8 10*3/uL (ref 0.1–1.0)
Monocytes Relative: 6 %
Neutro Abs: 9.6 10*3/uL — ABNORMAL HIGH (ref 1.7–7.7)
Neutrophils Relative %: 68 %
Platelet Count: 421 10*3/uL — ABNORMAL HIGH (ref 150–400)
RBC: 4.24 MIL/uL (ref 3.87–5.11)
RDW: 14.1 % (ref 11.5–15.5)
WBC Count: 13.8 10*3/uL — ABNORMAL HIGH (ref 4.0–10.5)
nRBC: 0 % (ref 0.0–0.2)

## 2020-03-05 LAB — FERRITIN: Ferritin: 64 ng/mL (ref 11–307)

## 2020-03-05 NOTE — Progress Notes (Signed)
  [facility] OFFICE PROGRESS NOTE   Diagnosis: Iron deficiency anemia  INTERVAL HISTORY:   Ms. Neyman returns as scheduled.  She denies bleeding.  She reports being diagnosed with "vasculitis".  She is completing a long steroid taper.  She notes improvement in the leg ulcers.  She reports significant weight gain with the steroids.  Objective:  Vital signs in last 24 hours:  Blood pressure (!) 122/55, pulse 86, temperature 98.7 F (37.1 C), temperature source Tympanic, resp. rate 17, height 5\' 5"  (1.651 m), weight 238 lb 11.2 oz (108.3 kg), SpO2 96 %.    HEENT: No thrush or ulcers. Resp: Lungs clear bilaterally. Cardio: Regular rate and rhythm. GI: Abdomen soft and nontender.  No hepatosplenomegaly. Vascular: Trace edema bilateral lower leg.    Lab Results:  Lab Results  Component Value Date   WBC 13.8 (H) 03/05/2020   HGB 11.3 (L) 03/05/2020   HCT 36.2 03/05/2020   MCV 85.4 03/05/2020   PLT 421 (H) 03/05/2020   NEUTROABS 9.6 (H) 03/05/2020    Imaging:  No results found.  Medications: I have reviewed the patient's current medications.  Assessment/Plan: 1. Anemia secondary to iron deficiency, possible GI blood loss related to gastritis status postIVferrous gluconate weekly x4 beginning 03/13/2017, single dose of IV ferrous gluconate 05/22/2017. Hemoglobin/MCV corrected into normal range. 2. Upper endoscopy 02/15/2017-diffuse moderate inflammation characterized by congestion, erythema, friability and granularity in the gastric body, posterior wall of the stomach and gastric antrum. Biopsy GASTRIC ANTRAL MUCOSA WITH NON-SPECIFIC REACTIVE GASTROPATHY AND FEW SUBEPITHELIAL CRYSTALLINEIRON DEPOSITS, CONSISTENT WITH IRON PILL GASTROPATHY. GASTRIC OXYNTIC MUCOSA WITH PARIETAL CELL HYPERPLASIA AS CAN BE SEEN IN HYPERGASTRINEMIC STATESSUCH AS PPI THERAPY.WARTHIN-STARRY STAIN IS NEGATIVE FOR HELICOBACTER PYLORI 3. Colonoscopy 02/15/2017-6 mm polyp in the transverse  colon (TUBULAR ADENOMA. NEGATIVE FOR HIGH GRADE DYSPLASIA OR MALIGNANCY) 4. Diabetes 5. Hypertension 6. Mild neutrophilia-chronic, white count dating at least to 2012on review of epic chart 7. Small hemoglobin on repeat urinalyses-seen by urology.   Disposition: Ms. Feldkamp appears stable.  Hemoglobin is slightly lower than when she was here June 2021.  We will follow-up on the ferritin from today, IV iron as indicated.  Plan for repeat CBC and ferritin in 4 months.  We will see her in follow-up in 8 months.  We discussed signs/symptoms suggestive of progressive anemia.  She understands to contact the office should she develop any of these.    Ned Card ANP/GNP-BC   03/05/2020  9:28 AM

## 2020-03-05 NOTE — Telephone Encounter (Signed)
Scheduled appointments per 2/24 los. Spoke to patient who is aware of appointments dates and times.

## 2020-03-09 ENCOUNTER — Telehealth: Payer: Self-pay

## 2020-03-09 NOTE — Telephone Encounter (Signed)
-----   Message from Owens Shark, NP sent at 03/06/2020  4:49 PM EST ----- Please let her know ferritin is stable. She does not need IV iron at this time. Follow-up as scheduled.

## 2020-03-09 NOTE — Telephone Encounter (Signed)
Spoke with pt to make aware of lab results ferritin pt appreciative of call nothing further call ended

## 2020-03-10 ENCOUNTER — Ambulatory Visit (INDEPENDENT_AMBULATORY_CARE_PROVIDER_SITE_OTHER): Payer: Medicare Other | Admitting: Orthopedic Surgery

## 2020-03-10 ENCOUNTER — Encounter: Payer: Self-pay | Admitting: Orthopedic Surgery

## 2020-03-10 DIAGNOSIS — M14672 Charcot's joint, left ankle and foot: Secondary | ICD-10-CM

## 2020-03-10 DIAGNOSIS — R21 Rash and other nonspecific skin eruption: Secondary | ICD-10-CM

## 2020-03-10 DIAGNOSIS — L97521 Non-pressure chronic ulcer of other part of left foot limited to breakdown of skin: Secondary | ICD-10-CM

## 2020-03-10 NOTE — Progress Notes (Unsigned)
Cardiology Office Note   Date:  03/12/2020   ID:  Beth Holden, DOB February 27, 1945, MRN [id number]  PCP:  Vivi Barrack, MD  Cardiologist:   Dorris Carnes, MD   Pt returns for f/u of HTN and HL   History of Present Illness: Beth Holden is a 75 y.o. female with a history of Diabetes GERD, HTN, HL   I saw her Dec 2017 for the first time for CP   Pain was substernal  Breathing helped   Not associated with activity   Myovue was done   This was normal     The patient also complained of some palpitations  Holter monitor showed no signif arrhythm     I recomm a trial of a low dose b blocke With cough I recomm she stop there ACE I  Since I saw her in clinic in 2019 she denies signif CP   No signif SOB except she occasionally wakes up from sleep SOB   Has to sit up     Has been told she snores  Followed in ortho for neuropathy   (Dr Sharol Given)  Activity limited by foot   I saw the pt in March 2021  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 Followed by Dr Ronnald Ramp at [facility] by Dr Sharol Given 2 days ago   Has a new ulcer on her foot   Has f/u visit in a couple weeks  Follows in Heme   Has not had iron infusion in awhile    Pt says she has occasional dizzy spells   Doesn't take BP at home   At heme clinic BP was 122/55  No CP   Breathing is OK     Outpatient Medications Prior to Visit  Medication Sig Dispense Refill  . acetaminophen (TYLENOL) 500 MG tablet Take 500 mg by mouth every 8 (eight) hours as needed for mild pain.     Marland Kitchen azelastine (ASTELIN) 0.1 % nasal spray Place 2 sprays into both nostrils 2 (two) times daily. 30 mL 12  . betamethasone dipropionate (DIPROLENE) 0.05 % cream Apply 1 application topically 2 (two) times daily as needed (IRRITATION). 30 g 0  . Blood Glucose Monitoring Suppl (ONE TOUCH ULTRA 2) w/Device KIT Use to check blood sugar 1 each 0  . cholecalciferol (VITAMIN D) 1000 units tablet Take 1  tablet by mouth daily.     Marland Kitchen dicyclomine (BENTYL) 10 MG capsule Take 1 capsule (10 mg total) by mouth 3 (three) times daily as needed for spasms. 90 capsule 5  . EQ ALLERGY RELIEF, CETIRIZINE, 10 MG tablet Take 1 tablet by mouth once daily 90 tablet 0  . Fluocinolone Acetonide 0.01 % OIL Use 1-2 drops daily as needed. 20 mL 0  . gabapentin (NEURONTIN) 100 MG capsule TAKE 2 TO 3 CAPSULES AT BEDTIME 180 capsule 3  . glucagon (GLUCAGEN) 1 MG SOLR injection Inject 1 mg into the muscle once as needed for up to 1 dose for low blood sugar. 1 each 11  . insulin NPH Human (NOVOLIN N RELION) 100 UNIT/ML injection Inject under skin 15 units in am and 10 units at bedtime (Patient taking differently: Inject under skin 12-15 units in am  and 10-15 units at bedtime) 20 mL 3  . insulin regular (NOVOLIN R RELION) 100 units/mL injection INJECT 12 TO 30 UNITS SUBCUTANEOUSLY THREE TIMES DAILY BEFORE MEAL(S) 80 mL  1  . Insulin Syringe-Needle U-100 (RELION INSULIN SYRINGE 1ML/31G) 31G X 5/16" 1 ML MISC USE 2 TIMES A DAY 100 each 2  . irbesartan-hydrochlorothiazide (AVALIDE) 150-12.5 MG tablet Take 1 tablet by mouth daily. 90 tablet 3  . metFORMIN (GLUCOPHAGE) 1000 MG tablet Take 1 tablet (1,000 mg total) by mouth 2 (two) times daily with a meal. 180 tablet 3  . metoprolol succinate (TOPROL-XL) 25 MG 24 hr tablet Take 1 tablet (25 mg total) by mouth daily. 90 tablet 3  . mupirocin ointment (BACTROBAN) 2 % Apply topically daily.    Marland Kitchen omeprazole (PRILOSEC) 40 MG capsule Take 1 capsule (40 mg total) by mouth 2 (two) times daily. 180 capsule 3  . ONETOUCH ULTRA test strip USE 1 STRIP TO CHECK GLUCOSE THREE TIMES DAILY 300 each 11  . OVER THE COUNTER MEDICATION Apply 1-3 application topically at bedtime. TOPRICIN FOOT CREAM - NEUROPATHY FOOT CREAM **APPLIES TO BOTH FEET AT BEDTIME**    . predniSONE (DELTASONE) 1 MG tablet Take 4 mg by mouth daily.    . Probiotic Product (PROBIOTIC DAILY PO) Take 1 capsule by mouth daily.    .  promethazine-dextromethorphan (PROMETHAZINE-DM) 6.25-15 MG/5ML syrup Take 2.5 mLs by mouth 4 (four) times daily as needed for cough. 118 mL 0  . rosuvastatin (CRESTOR) 20 MG tablet Take 1 tablet (20 mg total) by mouth daily. 90 tablet 3  . silver sulfADIAZINE (SILVADENE) 1 % cream Apply 1 application topically daily. 50 g 1  . vitamin B-12 (CYANOCOBALAMIN) 1000 MCG tablet Take 1,000 mcg daily by mouth.     No facility-administered medications prior to visit.     Allergies:   Codeine and Propoxyphene hcl   Past Medical History:  Diagnosis Date  . Alopecia   . Anemia, mild   . Arthritis   . Chronic cough    sees pulmonologist  . Chronic pain    chest wall and abd - s/p extensive eval  . Diabetes mellitus with neuropathy ([place])    sees endocrine  . Diverticulosis   . Fatty liver   . GERD (gastroesophageal reflux disease)    takes Nexium bid, hx erosive esophagitis  . Headache(784.0)    occasionally;r/t sinus   . History of colon polyps   . HTN (hypertension)   . Hx of amputation of lesser toe ([place])    sees podiatrist  . Hyperlipemia   . IBS (irritable bowel syndrome)   . Insomnia    takes Elavil nightly  . Joint pain   . Neuropathy   . Osteomyelitis ([place])   . Pneumonia   . PONV (postoperative nausea and vomiting)   . Seasonal allergies    takes Zyrtec daily  . Sinus tachycardia     Past Surgical History:  Procedure Laterality Date  . AMPUTATION  12/28/2011   Procedure: AMPUTATION DIGIT;  Surgeon: Newt Minion, MD;  Location: [facility];  Service: Orthopedics;  Laterality: Right;  Right Foot 2nd Toe Amputation at MTP (metatarsophalangeal joint)  . AMPUTATION Right 04/25/2012   Procedure: Right Foot 3rd Toe Amputation;  Surgeon: Newt Minion, MD;  Location: [facility];  Service: Orthopedics;  Laterality: Right;  Right Foot Third Toe Amputation   . AMPUTATION Right 07/27/2012   Procedure: Right 4th Toe Amputation at Metatarsophalangeal;  Surgeon: Newt Minion, MD;  Location: [facility];  Service: Orthopedics;  Laterality: Right;  Right 4th Toe Amputation at Metatarsophalangeal  . AMPUTATION Left 07/15/2016   Procedure: Left 2nd Ray Amputation;  Surgeon: Newt Minion, MD;  Location: [facility];  Service: Orthopedics;  Laterality: Left;  . AMPUTATION Left 11/18/2016   Procedure: Left 3rd and 4th Ray Amputation;  Surgeon: Newt Minion, MD;  Location: [facility];  Service: Orthopedics;  Laterality: Left;  . AMPUTATION Right 03/29/2017   Procedure: RIGHT FOOT 3RD AND 4TH RAY AMPUTATION;  Surgeon: Newt Minion, MD;  Location: [facility];  Service: Orthopedics;  Laterality: Right;  . COLONOSCOPY    . LAPAROSCOPIC APPENDECTOMY  01/05/2011   Procedure: APPENDECTOMY LAPAROSCOPIC;  Surgeon: Pedro Earls, MD;  [facility];  Service: General;  Laterality: N/A;  . OOPHORECTOMY  2001  . ROTATOR CUFF REPAIR Right    x 2  . TUBAL LIGATION    . VAGINAL HYSTERECTOMY  2001     Social History:  The patient  reports that she has never smoked. She has never used smokeless tobacco. She reports that she does not drink alcohol and does not use drugs.   Family History:  The patient's family history includes Brain cancer in her paternal uncle; COPD in her father; Coronary artery disease in her father; Dementia in her father; Diabetes in her father and another family member; Emphysema in her mother; Heart disease in her paternal uncle; Hyperlipidemia in her father; Hypertension in her father; Irritable bowel syndrome in an other family member; Lung cancer in her paternal uncle; Lung cancer (age of onset: 102) in her mother; Stomach cancer in her maternal aunt and paternal aunt.    ROS:  Please see the history of present illness. All other systems are reviewed and  Negative to the above problem except as noted.    PHYSICAL EXAM: VS:  BP 118/60   Pulse 74   Ht 5' 5.5" (1.664 m)   Wt 239 lb 12.8 oz (108.8 kg)   SpO2 97%   BMI 39.30 kg/m   GEN:  Morbidly obese 75 yo  in no acute distress  HEENT:  normal  Neck: no JVD, no carotid bruits,  Cardiac: RRR; no murmurs  No LE   edema  Respiratory:  clear to auscultation bilaterally, normal work of breathing GI: soft, nontender, nondistended, + BS  No hepatomegaly  MS: s/p amputation in toes bilatera    Skin: warm and dry  Healed lesions on legs  Neuro: CN II to XII intact     EKG:  EKG is not  ordered today.    Lipid Panel    Component Value Date/Time   CHOL 92 12/19/2019 1036   TRIG 116.0 12/19/2019 1036   HDL 36.30 (L) 12/19/2019 1036   CHOLHDL 3 12/19/2019 1036   VLDL 23.2 12/19/2019 1036   LDLCALC 33 12/19/2019 1036      Wt Readings from Last 3 Encounters:  03/12/20 239 lb 12.8 oz (108.8 kg)  03/05/20 238 lb 11.2 oz (108.3 kg)  02/03/20 223 lb (101.2 kg)      ASSESSMENT AND PLAN:  1  Leukocytoclastic vasculites   WIll get labs and other records from [facility]    2  Hx CP  Pt denies   3   SOB Volume is OK     4  HTN  BP is controlled  I told her to keep a log of BP  Call if 100s/ or lower or if she becomes dizzy.   Will readjust at that time      5  DM  Long standingHx of DM  with complcations   Follows with  C G[facility]      6  HL Lipids in Dec 2021 were excellent    F/U in September  Again, I have asked her to call  if BP low   Current medicines are reviewed at length with the patient today.  The patient does not have concerns regarding medicines.  Signed, Dorris Carnes, MD  03/12/2020 10:30 AM    [facility] [address], [place], [place]  [address] Phone: [phone]; Fax: [phone]

## 2020-03-10 NOTE — Progress Notes (Signed)
Office Visit Note   [name]           Date of Birth: 1945/12/17           MRN: [id number] Visit Date: 03/10/2020              Requested by: Vivi Barrack, MD [address] [place],  [place] [address] PCP: Vivi Barrack, MD  Chief Complaint  Patient presents with  . Left Foot - Follow-up      HPI: Patient presents in follow-up for both feet she [place] the [place] but not outside walking she states she has developed a new ulcer beneath the fourth metatarsal head of the left foot still has the ulcer over the Charcot rocker-bottom deformity of left foot and states that the vasculitis rash is resolving she states she is still on prednisone 1 mg a day.  Assessment & Plan: Visit Diagnoses:  1. Ulcer of left foot, limited to breakdown of skin ([place])   2. Charcot's joint, left ankle and foot   3. Rash and other nonspecific skin eruption     Plan: Both ulcers in the left foot were debrided of skin and soft tissue she will continue with her protective shoe wear compression stockings and reevaluate in 3 weeks  Follow-Up Instructions: Return in about 3 weeks (around 03/31/2020).   Ortho Exam  Patient is alert, oriented, no adenopathy, well-dressed, normal affect, normal respiratory effort. Examination patient has venous swelling in both legs there is no cellulitis no signs of infection the dermatitis vascular rash is improving.  Patient does have pitting edema in her legs.  Examination she has a new ulcer beneath the fourth metatarsal head of the left foot.  After informed consent a 10 blade knife was used to debride the skin and soft tissue back to healthy viable granulation tissue there is no exposed bone or tendon this was a blood blister prior to debridement there is serosanguineous fluid no abscess no signs of infection after debridement the ulcer is 3 cm in diameter and 2 mm deep silver nitrate was used for hemostasis.  No exposed bone or tendon.  The Wagner grade  1 ulcer beneath the Charcot rocker-bottom deformity was also debrided with a 10 blade knife this was 10 mm in diameter prior to debridement after debridement this was 2 cm in diameter and 1 mm deep.  Right foot has no ulcers.  Imaging: No results found. No images are attached to the encounter.  Labs: Lab Results  Component Value Date   HGBA1C 8.2 (A) 12/19/2019   HGBA1C 7.4 (A) 11/23/2018   HGBA1C 7.4 (A) 07/10/2018   ESRSEDRATE 51 (H) 12/24/2019   ESRSEDRATE 36 (H) 02/24/2017   CRP 13.2 (H) 12/24/2019   CRP 1.2 (H) 02/24/2017   LABURIC 5.5 09/01/2015   LABORGA GROUP B STREP (S.AGALACTIAE) ISOLATED 12/26/2014     Lab Results  Component Value Date   ALBUMIN 3.6 12/26/2019   ALBUMIN 4.0 12/19/2019   ALBUMIN 3.8 05/19/2017   PREALBUMIN 19.4 03/05/2012   LABURIC 5.5 09/01/2015    No results found for: MG No results found for: VD25OH  Lab Results  Component Value Date   PREALBUMIN 19.4 03/05/2012   CBC EXTENDED Latest Ref Rng & Units 03/05/2020 12/26/2019 12/24/2019  WBC 4.0 - 10.5 K/uL 13.8(H) 16.6(H) 13.0(H)  RBC 3.87 - 5.11 MIL/uL 4.24 4.03 4.32  HGB 12.0 - 15.0 g/dL 11.3(L) 11.0(L) 11.6(L)  HCT 36.0 - 46.0 % 36.2 35.4(L)  36.3  PLT 150 - 400 K/uL 421(H) 532(H) 490(H)  NEUTROABS 1.7 - 7.7 K/uL 9.6(H) 10.3(H) 11,323(H)  LYMPHSABS 0.7 - 4.0 K/uL 3.0 4.8(H) 1,313     There is no height or weight on file to calculate BMI.  Orders:  No orders of the defined types were placed in this encounter.  No orders of the defined types were placed in this encounter.    Procedures: No procedures performed  Clinical Data: No additional findings.  ROS:  All other systems negative, except as noted in the HPI. Review of Systems  Objective: Vital Signs: There were no vitals taken for this visit.  Specialty Comments:  No specialty comments available.  PMFS History: Patient Active Problem List   Diagnosis Date Noted  . Memory loss 11/21/2019  . Mild nonproliferative  diabetic retinopathy of both eyes ([place]) 10/23/2019  . Nuclear sclerotic cataract of both eyes 10/23/2019  . Posterior vitreous detachment of right eye 10/23/2019  . Retinal hemorrhage of left eye 10/23/2019  . Hyperlipidemia associated with type 2 diabetes mellitus ([place]) 11/30/2017  . Impingement syndrome of right shoulder 07/06/2017  . Iron deficiency anemia 05/11/2017  . Morbid obesity ([place]) 04/24/2017  . Anemia 02/24/2017  . Baker's cyst 07/10/2016  . Onychomycosis 12/07/2015  . Upper airway cough syndrome 03/05/2014  . History of amputation of lesser toe of right foot ([place]) 04/15/2013  . Hypertension associated with diabetes ([place]) 12/05/2006  . Uncontrolled type 2 diabetes mellitus with peripheral circulatory disorder ([place]) 10/12/2006  . Dyslipidemia associated with type 2 diabetes mellitus ([place]) 10/12/2006  . Allergic rhinitis 10/12/2006  . GERD - Followed by Dr. Fuller Plan 10/12/2006  . IRRITABLE BOWEL SYNDROME - followed by Dr. Fuller Plan 10/12/2006  . Peripheral neuropathy 10/11/2006  . ALOPECIA NEC 10/11/2006   Past Medical History:  Diagnosis Date  . Alopecia   . Anemia, mild   . Arthritis   . Chronic cough    sees pulmonologist  . Chronic pain    chest wall and abd - s/p extensive eval  . Diabetes mellitus with neuropathy ([place])    sees endocrine  . Diverticulosis   . Fatty liver   . GERD (gastroesophageal reflux disease)    takes Nexium bid, hx erosive esophagitis  . Headache(784.0)    occasionally;r/t sinus   . History of colon polyps   . HTN (hypertension)   . Hx of amputation of lesser toe ([place])    sees podiatrist  . Hyperlipemia   . IBS (irritable bowel syndrome)   . Insomnia    takes Elavil nightly  . Joint pain   . Neuropathy   . Osteomyelitis ([place])   . Pneumonia   . PONV (postoperative nausea and vomiting)   . Seasonal allergies    takes Zyrtec daily  . Sinus tachycardia     Family History  Problem Relation Age of Onset  . Lung cancer Mother 40        smoked heavily  . Emphysema Mother   . Hypertension Father   . Hyperlipidemia Father   . Diabetes Father   . Coronary artery disease Father   . Dementia Father   . COPD Father        smoked  . Stomach cancer Paternal Aunt   . Brain cancer Paternal Uncle   . Irritable bowel syndrome Other        Several family members on fathers side   . Diabetes Other   . Stomach cancer Maternal Aunt   . Lung  cancer Paternal Uncle   . Heart disease Paternal Uncle   . Colon cancer Neg Hx     Past Surgical History:  Procedure Laterality Date  . AMPUTATION  12/28/2011   Procedure: AMPUTATION DIGIT;  Surgeon: Newt Minion, MD;  Location: [facility];  Service: Orthopedics;  Laterality: Right;  Right Foot 2nd Toe Amputation at MTP (metatarsophalangeal joint)  . AMPUTATION Right 04/25/2012   Procedure: Right Foot 3rd Toe Amputation;  Surgeon: Newt Minion, MD;  Location: [facility];  Service: Orthopedics;  Laterality: Right;  Right Foot Third Toe Amputation   . AMPUTATION Right 07/27/2012   Procedure: Right 4th Toe Amputation at Metatarsophalangeal;  Surgeon: Newt Minion, MD;  Location: [facility];  Service: Orthopedics;  Laterality: Right;  Right 4th Toe Amputation at Metatarsophalangeal  . AMPUTATION Left 07/15/2016   Procedure: Left 2nd Ray Amputation;  Surgeon: Newt Minion, MD;  Location: [facility];  Service: Orthopedics;  Laterality: Left;  . AMPUTATION Left 11/18/2016   Procedure: Left 3rd and 4th Ray Amputation;  Surgeon: Newt Minion, MD;  Location: [facility];  Service: Orthopedics;  Laterality: Left;  . AMPUTATION Right 03/29/2017   Procedure: RIGHT FOOT 3RD AND 4TH RAY AMPUTATION;  Surgeon: Newt Minion, MD;  Location: [facility];  Service: Orthopedics;  Laterality: Right;  . COLONOSCOPY    . LAPAROSCOPIC APPENDECTOMY  01/05/2011   Procedure: APPENDECTOMY LAPAROSCOPIC;  Surgeon: Pedro Earls, MD;  [facility];  Service: General;  Laterality: N/A;  . OOPHORECTOMY  2001  . ROTATOR CUFF REPAIR Right    x 2   . TUBAL LIGATION    . VAGINAL HYSTERECTOMY  2001   Social History   Occupational History  . Occupation: Retired   Tobacco Use  . Smoking status: Never Smoker  . Smokeless tobacco: Never Used  Vaping Use  . Vaping Use: Never used  Substance and Sexual Activity  . Alcohol use: No  . Drug use: No  . Sexual activity: Yes    Birth control/protection: Surgical

## 2020-03-12 ENCOUNTER — Other Ambulatory Visit: Payer: Self-pay

## 2020-03-12 ENCOUNTER — Ambulatory Visit (INDEPENDENT_AMBULATORY_CARE_PROVIDER_SITE_OTHER): Payer: Medicare Other | Admitting: Internal Medicine

## 2020-03-12 ENCOUNTER — Encounter: Payer: Self-pay | Admitting: Internal Medicine

## 2020-03-12 VITALS — BP 118/60 | HR 74 | Ht 65.5 in | Wt 239.8 lb

## 2020-03-12 DIAGNOSIS — I10 Essential (primary) hypertension: Secondary | ICD-10-CM

## 2020-03-12 NOTE — Patient Instructions (Signed)
Medication Instructions:  No changes today *If you need a refill on your cardiac medications before your next appointment, please call your pharmacy*   Lab Work: none If you have labs (blood work) drawn today and your tests are completely normal, you will receive your results only by: Marland Kitchen MyChart Message (if you have MyChart) OR . A paper copy in the mail If you have any lab test that is abnormal or we need to change your treatment, we will call you to review the results.   Testing/Procedures: none   Follow-Up: At [facility], you and your health needs are our priority.  As part of our continuing mission to provide you with exceptional heart care, we have created designated Provider Care Teams.  These Care Teams include your primary Cardiologist (physician) and Advanced Practice Providers (APPs -  Physician Assistants and Nurse Practitioners) who all work together to provide you with the care you need, when you need it.  We recommend signing up for the patient portal called "MyChart".  Sign up information is provided on this After Visit Summary.  MyChart is used to connect with patients for Virtual Visits (Telemedicine).  Patients are able to view lab/test results, encounter notes, upcoming appointments, etc.  Non-urgent messages can be sent to your provider as well.   To learn more about what you can do with MyChart, go to [email or web address].    Your next appointment:   6 month(s)  The format for your next appointment:   In Person  Provider:   You may see Dorris Carnes, MD or one of the following Advanced Practice Providers on your designated Care Team:    Richardson Dopp, PA-C  Robbie Lis, [place]   Other Instructions

## 2020-03-17 DIAGNOSIS — M310 Hypersensitivity angiitis: Secondary | ICD-10-CM | POA: Diagnosis not present

## 2020-03-18 ENCOUNTER — Ambulatory Visit (INDEPENDENT_AMBULATORY_CARE_PROVIDER_SITE_OTHER): Payer: Medicare Other | Admitting: Internal Medicine

## 2020-03-18 ENCOUNTER — Encounter: Payer: Self-pay | Admitting: Internal Medicine

## 2020-03-18 ENCOUNTER — Other Ambulatory Visit: Payer: Self-pay

## 2020-03-18 VITALS — BP 140/90 | HR 71 | Ht 65.5 in | Wt 237.4 lb

## 2020-03-18 DIAGNOSIS — E1151 Type 2 diabetes mellitus with diabetic peripheral angiopathy without gangrene: Secondary | ICD-10-CM | POA: Diagnosis not present

## 2020-03-18 DIAGNOSIS — IMO0002 Reserved for concepts with insufficient information to code with codable children: Secondary | ICD-10-CM

## 2020-03-18 DIAGNOSIS — E1165 Type 2 diabetes mellitus with hyperglycemia: Secondary | ICD-10-CM

## 2020-03-18 DIAGNOSIS — G63 Polyneuropathy in diseases classified elsewhere: Secondary | ICD-10-CM

## 2020-03-18 DIAGNOSIS — E1169 Type 2 diabetes mellitus with other specified complication: Secondary | ICD-10-CM | POA: Diagnosis not present

## 2020-03-18 DIAGNOSIS — E785 Hyperlipidemia, unspecified: Secondary | ICD-10-CM

## 2020-03-18 LAB — POCT GLYCOSYLATED HEMOGLOBIN (HGB A1C): Hemoglobin A1C: 8 % — AB (ref 4.0–5.6)

## 2020-03-18 NOTE — Progress Notes (Addendum)
Subjective:     Patient ID: Beth Holden, female   DOB: [birth year], 75 y.o.   MRN: [id number]  This visit occurred during the SARS-CoV-2 public health emergency.  Safety protocols were in place, including screening questions prior to the visit, additional usage of staff PPE, and extensive cleaning of exam room while observing appropriate contact time as indicated for disinfecting solutions.   HPI Beth Holden is a pleasant 75 y.o. woman returning for f/u for DM2, dx ~2000, uncontrolled, insulin-dependent, with complications (diabetic peripheral neuropathy, multiple toe amputations).  Last visit 3 months ago.  She is still on Prednisone for her LE rash >> tapering down >> now 1 mg daily >> will stop in 2.5 more weeks.  Rocephin higher on prednisone.  Reviewed HbA1c levels: Lab Results  Component Value Date   HGBA1C 8.2 (A) 12/19/2019   HGBA1C 7.4 (A) 11/23/2018   HGBA1C 7.4 (A) 07/10/2018   She is on:: - Metformin 1000 mg 2x a day Insulin Before breakfast Before lunch Before dinner At bedtime  Regular 20-25 >> 25-30 5-10 >> 10-15 units 10-15 x  NPH 20 x x 10-15   She also tried: Levemir 47 units in am and 37 units in pm >> $800 for 3 mo supply Januvia 100 mg daily - $450 for 3 mo Invokana 100 mg - $455 for 3 mo Took Byetta before We discussed about starting her on a VGo mechanical pump in the past. She had an appointment with diabetes education but decided not to pursue it. We also tried a sliding scale of regular insulin in the past but she was not using it. We tried Trulicity but she could not tolerate it due to nausea, diarrhea, constipation, weakness.  She checks her sugars 2-4 times a day - in last 2 weeks: - am:  90s, 120-140s, 220, 242 >> 71, 110-198, 213 >> 113-227 - 2h after b'fast : 155-255 >> n/c >> 199-325 >> 146-314 - prelunch:   78, 188-274 >> 100-200s >> n/c > 214-314 - 2-3h after lunch:  n/c >> 58,  144-275 >> 135-361 - before dinner:  140-200s >> 222 >> 126-215 -  after dinner:  82-176, 290 >> n/c >> 213-246 >> 154, 210-336 - bedtime: 180-200s, 300s >> 66, 105-256 >> 127-285 - nighttime: 40's, 110-300s >> 123-241 >> 82-142 Lowest: 40s x3 (did not eat supper) >> 58 >> 82.  Has hypoglycemia awareness in the 70s. Highest: 342 in 05/2018 >> n/c >> 325 >>383 (Prednisone).  Meals: - Breakfast: egg, bacon, toast; grits; biscuit; fruit; sometimes skips >> 2 slices of toast + butter - Lunch: 1/2 PB sandwich or soup and yoghurt, sometimes crackers - Dinner: meat + vegetables + some starch - Snacks: fruit   + Mild CKD: BUN  Date Value Ref Range Status  12/26/2019 25 (H) 8 - 23 mg/dL Final   Creatinine, Ser  Date Value Ref Range Status  12/26/2019 1.10 (H) 0.44 - 1.00 mg/dL Final  On irbesartan.  -+ HL; latest lipid panel: Lab Results  Component Value Date   CHOL 92 12/19/2019   HDL 36.30 (L) 12/19/2019   LDLCALC 33 12/19/2019   TRIG 116.0 12/19/2019   CHOLHDL 3 12/19/2019  On rosuvastatin 20. - last eye exam: 10/2019: Mild NPDR OU, without macular edema (Dr. Zadie Rhine). -+ Numbness and tingling in feet-previously on amitriptyline, now on Neurontin  Derm: Dr. Jarome Matin. Need records Re: GFR.   Review of Systems  Constitutional: no weight gain/no weight loss, no fatigue, no subjective  hyperthermia, no subjective hypothermia Eyes: no blurry vision, no xerophthalmia ENT: no sore throat, no nodules palpated in neck, no dysphagia, no odynophagia, no hoarseness Cardiovascular: no CP/no SOB/no palpitations/no leg swelling Respiratory: no cough/no SOB/no wheezing Gastrointestinal: no N/no V/no D/no C/no acid reflux Musculoskeletal: no muscle aches/no joint aches Skin: no rashes, no hair loss Neurological: no tremors/+ numbness/+ tingling/no dizziness  I reviewed pt's medications, allergies, PMH, social hx, family hx, and changes were documented in the history of present illness. Otherwise, unchanged from my initial visit note.  Past Medical  History:  Diagnosis Date  . Alopecia   . Anemia, mild   . Arthritis   . Chronic cough    sees pulmonologist  . Chronic pain    chest wall and abd - s/p extensive eval  . Diabetes mellitus with neuropathy ([place])    sees endocrine  . Diverticulosis   . Fatty liver   . GERD (gastroesophageal reflux disease)    takes Nexium bid, hx erosive esophagitis  . Headache(784.0)    occasionally;r/t sinus   . History of colon polyps   . HTN (hypertension)   . Hx of amputation of lesser toe ([place])    sees podiatrist  . Hyperlipemia   . IBS (irritable bowel syndrome)   . Insomnia    takes Elavil nightly  . Joint pain   . Neuropathy   . Osteomyelitis ([place])   . Pneumonia   . PONV (postoperative nausea and vomiting)   . Seasonal allergies    takes Zyrtec daily  . Sinus tachycardia    Past Surgical History:  Procedure Laterality Date  . AMPUTATION  12/28/2011   Procedure: AMPUTATION DIGIT;  Surgeon: Newt Minion, MD;  Location: [place];  Service: Orthopedics;  Laterality: Right;  Right Foot 2nd Toe Amputation at MTP (metatarsophalangeal joint)  . AMPUTATION Right 04/25/2012   Procedure: Right Foot 3rd Toe Amputation;  Surgeon: Newt Minion, MD;  Location: [facility];  Service: Orthopedics;  Laterality: Right;  Right Foot Third Toe Amputation   . AMPUTATION Right 07/27/2012   Procedure: Right 4th Toe Amputation at Metatarsophalangeal;  Surgeon: Newt Minion, MD;  Location: [facility];  Service: Orthopedics;  Laterality: Right;  Right 4th Toe Amputation at Metatarsophalangeal  . AMPUTATION Left 07/15/2016   Procedure: Left 2nd Ray Amputation;  Surgeon: Newt Minion, MD;  Location: [facility];  Service: Orthopedics;  Laterality: Left;  . AMPUTATION Left 11/18/2016   Procedure: Left 3rd and 4th Ray Amputation;  Surgeon: Newt Minion, MD;  Location: [facility];  Service: Orthopedics;  Laterality: Left;  . AMPUTATION Right 03/29/2017   Procedure: RIGHT FOOT 3RD AND 4TH RAY AMPUTATION;  Surgeon: Newt Minion, MD;   Location: [facility];  Service: Orthopedics;  Laterality: Right;  . COLONOSCOPY    . LAPAROSCOPIC APPENDECTOMY  01/05/2011   Procedure: APPENDECTOMY LAPAROSCOPIC;  Surgeon: Pedro Earls, MD;  [facility];  Service: General;  Laterality: N/A;  . OOPHORECTOMY  2001  . ROTATOR CUFF REPAIR Right    x 2  . TUBAL LIGATION    . VAGINAL HYSTERECTOMY  2001   Social History   Socioeconomic History  . Marital status: Married    Spouse name: Not on file  . Number of children: 2  . Years of education: Not on file  . Highest education level: Not on file  Occupational History  . Occupation: Retired   Tobacco Use  . Smoking status: Never Smoker  . Smokeless tobacco:  Never Used  Vaping Use  . Vaping Use: Never used  Substance and Sexual Activity  . Alcohol use: No  . Drug use: No  . Sexual activity: Yes    Birth control/protection: Surgical  Other Topics Concern  . Not on file  Social History Narrative   Caffeine daily    [employer], [employer] no diploma   Married '66   1 dtr- '78; 1 son '71; 2 grandchildren   Occupation: retired 04   Dad with alzheimers-had to place in [place] (summer '10)         Social Determinants of Radio broadcast assistant Strain: Not on Art therapist Insecurity: Not on file  Transportation Needs: Not on file  Physical Activity: Not on file  Stress: Not on file  Social Connections: Not on file  Intimate Partner Violence: Not on file   Current Outpatient Medications on File Prior to Visit  Medication Sig Dispense Refill  . acetaminophen (TYLENOL) 500 MG tablet Take 500 mg by mouth every 8 (eight) hours as needed for mild pain.     Marland Kitchen azelastine (ASTELIN) 0.1 % nasal spray Place 2 sprays into both nostrils 2 (two) times daily. 30 mL 12  . betamethasone dipropionate (DIPROLENE) 0.05 % cream Apply 1 application topically 2 (two) times daily as needed (IRRITATION). 30 g 0  . Blood Glucose Monitoring Suppl (ONE TOUCH ULTRA 2) w/Device KIT Use to check blood sugar 1 each 0   . cholecalciferol (VITAMIN D) 1000 units tablet Take 1 tablet by mouth daily.     Marland Kitchen dicyclomine (BENTYL) 10 MG capsule Take 1 capsule (10 mg total) by mouth 3 (three) times daily as needed for spasms. 90 capsule 5  . EQ ALLERGY RELIEF, CETIRIZINE, 10 MG tablet Take 1 tablet by mouth once daily 90 tablet 0  . Fluocinolone Acetonide 0.01 % OIL Use 1-2 drops daily as needed. 20 mL 0  . gabapentin (NEURONTIN) 100 MG capsule TAKE 2 TO 3 CAPSULES AT BEDTIME 180 capsule 3  . glucagon (GLUCAGEN) 1 MG SOLR injection Inject 1 mg into the muscle once as needed for up to 1 dose for low blood sugar. 1 each 11  . insulin NPH Human (NOVOLIN N RELION) 100 UNIT/ML injection Inject under skin 15 units in am and 10 units at bedtime (Patient taking differently: Inject under skin 12-15 units in am  and 10-15 units at bedtime) 20 mL 3  . insulin regular (NOVOLIN R RELION) 100 units/mL injection INJECT 12 TO 30 UNITS SUBCUTANEOUSLY THREE TIMES DAILY BEFORE MEAL(S) 80 mL 1  . Insulin Syringe-Needle U-100 (RELION INSULIN SYRINGE 1ML/31G) 31G X 5/16" 1 ML MISC USE 2 TIMES A DAY 100 each 2  . irbesartan-hydrochlorothiazide (AVALIDE) 150-12.5 MG tablet Take 1 tablet by mouth daily. 90 tablet 3  . metFORMIN (GLUCOPHAGE) 1000 MG tablet Take 1 tablet (1,000 mg total) by mouth 2 (two) times daily with a meal. 180 tablet 3  . metoprolol succinate (TOPROL-XL) 25 MG 24 hr tablet Take 1 tablet (25 mg total) by mouth daily. 90 tablet 3  . mupirocin ointment (BACTROBAN) 2 % Apply topically daily.    Marland Kitchen omeprazole (PRILOSEC) 40 MG capsule Take 1 capsule (40 mg total) by mouth 2 (two) times daily. 180 capsule 3  . ONETOUCH ULTRA test strip USE 1 STRIP TO CHECK GLUCOSE THREE TIMES DAILY 300 each 11  . OVER THE COUNTER MEDICATION Apply 1-3 application topically at bedtime. TOPRICIN FOOT CREAM - NEUROPATHY FOOT CREAM **APPLIES TO BOTH  FEET AT BEDTIME**    . predniSONE (DELTASONE) 1 MG tablet Take 4 mg by mouth daily.    . Probiotic Product  (PROBIOTIC DAILY PO) Take 1 capsule by mouth daily.    . promethazine-dextromethorphan (PROMETHAZINE-DM) 6.25-15 MG/5ML syrup Take 2.5 mLs by mouth 4 (four) times daily as needed for cough. 118 mL 0  . rosuvastatin (CRESTOR) 20 MG tablet Take 1 tablet (20 mg total) by mouth daily. 90 tablet 3  . silver sulfADIAZINE (SILVADENE) 1 % cream Apply 1 application topically daily. 50 g 1  . vitamin B-12 (CYANOCOBALAMIN) 1000 MCG tablet Take 1,000 mcg daily by mouth.     No current facility-administered medications on file prior to visit.   Allergies  Allergen Reactions  . Codeine Other (See Comments)    Makes her crazy  . Propoxyphene Hcl Itching    *DARVOCET    Family History  Problem Relation Age of Onset  . Lung cancer Mother 69       smoked heavily  . Emphysema Mother   . Hypertension Father   . Hyperlipidemia Father   . Diabetes Father   . Coronary artery disease Father   . Dementia Father   . COPD Father        smoked  . Stomach cancer Paternal Aunt   . Brain cancer Paternal Uncle   . Irritable bowel syndrome Other        Several family members on fathers side   . Diabetes Other   . Stomach cancer Maternal Aunt   . Lung cancer Paternal Uncle   . Heart disease Paternal Uncle   . Colon cancer Neg Hx     Objective:   Physical Exam BP 140/90 (BP Location: Right Arm, Patient Position: Sitting, Cuff Size: Normal)   Pulse 71   Ht 5' 5.5" (1.664 m)   Wt 237 lb 6.4 oz (107.7 kg)   SpO2 97%   BMI 38.90 kg/m  Body mass index is 38.9 kg/m. Wt Readings from Last 3 Encounters:  03/18/20 237 lb 6.4 oz (107.7 kg)  03/12/20 239 lb 12.8 oz (108.8 kg)  03/05/20 238 lb 11.2 oz (108.3 kg)   Constitutional: overweight, in NAD Eyes: PERRLA, EOMI, no exophthalmos ENT: moist mucous membranes, no thyromegaly, no cervical lymphadenopathy Cardiovascular: RRR, No MRG Respiratory: CTA B Gastrointestinal: abdomen soft, NT, ND, BS+ Musculoskeletal: + deformities (several amputations of toes,  Charcot deformity of the left foot), left foot removed, strength intact in all 4 Skin: moist, warm, + red-violaceous, healing macular papular rash on LEs Neurological: no tremor with outstretched hands, DTR normal in all 4  Assessment:     1. 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 HL  Plan:     1. DM2 - pt with longstanding, uncontrolled, type 2 diabetes, on basal/bolus insulin regimen with NPH and regular insulin due to price.  She is also on Metformin.  Sugars are usually very fluctuating, consistent with insulin deficiency.  We did not check her for type 1 diabetes and states would not change the management. -Since last visit, she started on prednisone and she is on a long taper for lower extremity rash.  She is currently on 1 mg daily and tapering down expecting to stop in 2.5 weeks.  As a consequence, sugars have been higher.  No lows.  There is no consistent pattern in her blood sugars, but her test blood sugars are actually at night. -At today's visit, we discussed about increasing NPH in the morning, since the pharmacodynamic of this insulin matches the prednisone induced increase in blood sugars.  We will keep the same doses of regular insulin for now.  We discussed about other options for treatment.  She has been on Trulicity in the past which she could not tolerate due to GI symptoms.  She has IBS and has alternating diarrhea with constipation.  We also discussed about SGLT2 inhibitors.  She would be open to use these, but she did have a lower GFR on last available records.  She had a more recent GFR in her dermatologist office and we will try to get these  records.  Afterwards, if the kidney function allows, we can try to start her on Jardiance or Farxiga low dose.  She does have increased urination. - I advised her to: Patient Instructions   Please continue: - Metformin 1000 mg 2x a day Insulin Before breakfast Before lunch Before dinner At bedtime  Regular 25-30 10-15 10-15   NPH 20 x x 10   Please return in 4 months with your sugar log  - we checked her HbA1c: 8.0% (lower) - advised to check sugars at different times of the day - 3x a day, rotating check times - advised for yearly eye exams >> she is UTD - return to clinic in 4 months  2. PN -Due to diabetes -Continues to have numbness and tingling mostly in her left big toe -Previously on amitriptyline but now on Neurontin.  She takes 2 capsules at night.  I am usually refilling this for her.  3. HL -Reviewed latest lipid panel from 12/2019: Fractions at goal in exception of a slightly low HDL Lab Results  Component Value Date   CHOL 92 12/19/2019   HDL 36.30 (L) 12/19/2019   LDLCALC 33 12/19/2019   TRIG 116.0 12/19/2019   CHOLHDL 3 12/19/2019  -Continues Crestor 20 without side effects  Reviewed latest CMP per records from dermatology: 02/12/2020: Glucose 254, BUN/creatinine 19/0.83, GFR 70, improved  We can try a low-dose [place].  I will try to send this to her pharmacy.  Philemon Kingdom, MD PhD [facility]

## 2020-03-18 NOTE — Patient Instructions (Addendum)
  Please continue: - Metformin 1000 mg 2x a day Insulin Before breakfast Before lunch Before dinner At bedtime  Regular 25-30 (10-15) 10-15   NPH 20  >> 26 x x 10   Please return in 4 months with your sugar log

## 2020-03-23 DIAGNOSIS — D2371 Other benign neoplasm of skin of right lower limb, including hip: Secondary | ICD-10-CM | POA: Diagnosis not present

## 2020-03-23 DIAGNOSIS — L820 Inflamed seborrheic keratosis: Secondary | ICD-10-CM | POA: Diagnosis not present

## 2020-03-23 DIAGNOSIS — M310 Hypersensitivity angiitis: Secondary | ICD-10-CM | POA: Diagnosis not present

## 2020-03-23 DIAGNOSIS — D2372 Other benign neoplasm of skin of left lower limb, including hip: Secondary | ICD-10-CM | POA: Diagnosis not present

## 2020-03-31 ENCOUNTER — Encounter: Payer: Self-pay | Admitting: Internal Medicine

## 2020-03-31 ENCOUNTER — Ambulatory Visit (INDEPENDENT_AMBULATORY_CARE_PROVIDER_SITE_OTHER): Payer: Medicare Other | Admitting: Orthopedic Surgery

## 2020-03-31 DIAGNOSIS — B351 Tinea unguium: Secondary | ICD-10-CM

## 2020-03-31 DIAGNOSIS — M14672 Charcot's joint, left ankle and foot: Secondary | ICD-10-CM

## 2020-03-31 DIAGNOSIS — L97521 Non-pressure chronic ulcer of other part of left foot limited to breakdown of skin: Secondary | ICD-10-CM | POA: Diagnosis not present

## 2020-03-31 MED ORDER — DAPAGLIFLOZIN PROPANEDIOL 5 MG PO TABS: 5.0000 mg | ORAL_TABLET | Freq: Every day | ORAL | 11 refills | Status: DC

## 2020-03-31 NOTE — Addendum Note (Signed)
Addended by: Philemon Kingdom on: 03/31/2020 09:10 AM   Modules accepted: Orders

## 2020-04-03 ENCOUNTER — Encounter: Payer: Self-pay | Admitting: Orthopedic Surgery

## 2020-04-03 NOTE — Progress Notes (Signed)
Office Visit Note   [name]           Date of Birth: [birth year]           MRN: [id number] Visit Date: 03/31/2020              Requested by: Vivi Barrack, MD [address] [place],  [place] [address] PCP: Vivi Barrack, MD  Chief Complaint  Patient presents with   Left Foot - Follow-up      HPI: Patient is a 75 year old woman who presents in follow-up for [facility] - Kmi grade 1 ulcer plantar aspect left foot secondary to Charcot collapse.  Assessment & Plan: Visit Diagnoses:  1. Onychomycosis   2. Ulcer of left foot, limited to breakdown of skin ([place])   3. Charcot's joint, left ankle and foot     Plan: The ulcer is smaller debrided back to healthy viable granulation tissue no signs of infection.  Nails were trimmed x4.  Follow-Up Instructions: Return in about 4 weeks (around 04/28/2020).   Ortho Exam  Patient is alert, oriented, no adenopathy, well-dressed, normal affect, normal respiratory effort. Examination patient has thickened discolored onychomycotic nails she is unable to safely trim the nails on her own and the nails were trimmed x4 without complications.  She has interval healing of the Wagner grade 1 ulcer beneath the Charcot rocker-bottom deformity left foot.  The ulcer is 1 cm in diameter.  After informed consent a 10 blade knife was used to debride the skin and soft tissue back to bleeding viable granulation tissue this was touched with silver nitrate after debridement the ulcer was 2 cm in diameter 2 mm deep with healthy granulation tissue.  Imaging: No results found. No images are attached to the encounter.  Labs: Lab Results  Component Value Date   HGBA1C 8.0 (A) 03/18/2020   HGBA1C 8.2 (A) 12/19/2019   HGBA1C 7.4 (A) 11/23/2018   ESRSEDRATE 51 (H) 12/24/2019   ESRSEDRATE 36 (H) 02/24/2017   CRP 13.2 (H) 12/24/2019   CRP 1.2 (H) 02/24/2017   LABURIC 5.5 09/01/2015   LABORGA GROUP B STREP (S.AGALACTIAE) ISOLATED 12/26/2014     Lab  Results  Component Value Date   ALBUMIN 3.6 12/26/2019   ALBUMIN 4.0 12/19/2019   ALBUMIN 3.8 05/19/2017   PREALBUMIN 19.4 03/05/2012    No results found for: MG No results found for: VD25OH  Lab Results  Component Value Date   PREALBUMIN 19.4 03/05/2012   CBC EXTENDED Latest Ref Rng & Units 03/05/2020 12/26/2019 12/24/2019  WBC 4.0 - 10.5 K/uL 13.8(H) 16.6(H) 13.0(H)  RBC 3.87 - 5.11 MIL/uL 4.24 4.03 4.32  HGB 12.0 - 15.0 g/dL 11.3(L) 11.0(L) 11.6(L)  HCT 36.0 - 46.0 % 36.2 35.4(L) 36.3  PLT 150 - 400 K/uL 421(H) 532(H) 490(H)  NEUTROABS 1.7 - 7.7 K/uL 9.6(H) 10.3(H) 11,323(H)  LYMPHSABS 0.7 - 4.0 K/uL 3.0 4.8(H) 1,313     There is no height or weight on file to calculate BMI.  Orders:  No orders of the defined types were placed in this encounter.  No orders of the defined types were placed in this encounter.    Procedures: No procedures performed  Clinical Data: No additional findings.  ROS:  All other systems negative, except as noted in the HPI. Review of Systems  Objective: Vital Signs: There were no vitals taken for this visit.  Specialty Comments:  No specialty comments available.  PMFS History: Patient Active Problem List   Diagnosis Date Noted  Memory loss 11/21/2019   Mild nonproliferative diabetic retinopathy of both eyes ([place]) 10/23/2019   Nuclear sclerotic cataract of both eyes 10/23/2019   Posterior vitreous detachment of right eye 10/23/2019   Retinal hemorrhage of left eye 10/23/2019   Hyperlipidemia associated with type 2 diabetes mellitus ([place]) 11/30/2017   Impingement syndrome of right shoulder 07/06/2017   Iron deficiency anemia 05/11/2017   Morbid obesity ([place]) 04/24/2017   Anemia 02/24/2017   Baker's cyst 07/10/2016   Onychomycosis 12/07/2015   Upper airway cough syndrome 03/05/2014   History of amputation of lesser toe of right foot ([place]) 04/15/2013   Hypertension associated with diabetes ([place]) 12/05/2006    Uncontrolled type 2 diabetes mellitus with peripheral circulatory disorder ([place]) 10/12/2006   Dyslipidemia associated with type 2 diabetes mellitus ([place]) 10/12/2006   Allergic rhinitis 10/12/2006   GERD - Followed by Dr. Fuller Plan 10/12/2006   IRRITABLE BOWEL SYNDROME - followed by Dr. Fuller Plan 10/12/2006   Peripheral neuropathy 10/11/2006   ALOPECIA NEC 10/11/2006   Past Medical History:  Diagnosis Date   Alopecia    Anemia, mild    Arthritis    Chronic cough    sees pulmonologist   Chronic pain    chest wall and abd - s/p extensive eval   Diabetes mellitus with neuropathy ([facility])    sees endocrine   Diverticulosis    Fatty liver    GERD (gastroesophageal reflux disease)    takes Nexium bid, hx erosive esophagitis   Headache(784.0)    occasionally;r/t sinus    History of colon polyps    HTN (hypertension)    Hx of amputation of lesser toe ([facility])    sees podiatrist   Hyperlipemia    IBS (irritable bowel syndrome)    Insomnia    takes Elavil nightly   Joint pain    Neuropathy    Osteomyelitis ([facility])    Pneumonia    PONV (postoperative nausea and vomiting)    Seasonal allergies    takes Zyrtec daily   Sinus tachycardia     Family History  Problem Relation Age of Onset   Lung cancer Mother 46       smoked heavily   Emphysema Mother    Hypertension Father    Hyperlipidemia Father    Diabetes Father    Coronary artery disease Father    Dementia Father    COPD Father        smoked   Stomach cancer Paternal Aunt    Brain cancer Paternal Uncle    Irritable bowel syndrome Other        Several family members on fathers side    Diabetes Other    Stomach cancer Maternal Aunt    Lung cancer Paternal Uncle    Heart disease Paternal Uncle    Colon cancer Neg Hx     Past Surgical History:  Procedure Laterality Date   AMPUTATION  12/28/2011   Procedure: AMPUTATION DIGIT;  Surgeon: Newt Minion, MD;  Location: [place];  Service:  Orthopedics;  Laterality: Right;  Right Foot 2nd Toe Amputation at MTP (metatarsophalangeal joint)   AMPUTATION Right 04/25/2012   Procedure: Right Foot 3rd Toe Amputation;  Surgeon: Newt Minion, MD;  Location: [place];  Service: Orthopedics;  Laterality: Right;  Right Foot Third Toe Amputation    AMPUTATION Right 07/27/2012   Procedure: Right 4th Toe Amputation at Metatarsophalangeal;  Surgeon: Newt Minion, MD;  Location: [place];  Service: Orthopedics;  Laterality: Right;  Right  4th Toe Amputation at Metatarsophalangeal   AMPUTATION Left 07/15/2016   Procedure: Left 2nd Ray Amputation;  Surgeon: Newt Minion, MD;  Location: [place];  Service: Orthopedics;  Laterality: Left;   AMPUTATION Left 11/18/2016   Procedure: Left 3rd and 4th Ray Amputation;  Surgeon: Newt Minion, MD;  Location: [place];  Service: Orthopedics;  Laterality: Left;   AMPUTATION Right 03/29/2017   Procedure: RIGHT FOOT 3RD AND 4TH RAY AMPUTATION;  Surgeon: Newt Minion, MD;  Location: [place];  Service: Orthopedics;  Laterality: Right;   COLONOSCOPY     LAPAROSCOPIC APPENDECTOMY  01/05/2011   Procedure: APPENDECTOMY LAPAROSCOPIC;  Surgeon: Pedro Earls, MD;  [facility];  Service: General;  Laterality: N/A;   OOPHORECTOMY  2001   ROTATOR CUFF REPAIR Right    x 2   TUBAL LIGATION     VAGINAL HYSTERECTOMY  2001   Social History   Occupational History   Occupation: Retired   Tobacco Use   Smoking status: Never Smoker   Smokeless tobacco: Never Used  Scientific laboratory technician Use: Never used  Substance and Sexual Activity   Alcohol use: No   Drug use: No   Sexual activity: Yes    Birth control/protection: Surgical

## 2020-04-14 ENCOUNTER — Other Ambulatory Visit: Payer: Self-pay | Admitting: Family Medicine

## 2020-04-15 DIAGNOSIS — M310 Hypersensitivity angiitis: Secondary | ICD-10-CM | POA: Diagnosis not present

## 2020-04-27 ENCOUNTER — Encounter: Payer: Self-pay | Admitting: Orthopedic Surgery

## 2020-04-27 ENCOUNTER — Ambulatory Visit (INDEPENDENT_AMBULATORY_CARE_PROVIDER_SITE_OTHER): Payer: Medicare Other | Admitting: Orthopedic Surgery

## 2020-04-27 DIAGNOSIS — L97521 Non-pressure chronic ulcer of other part of left foot limited to breakdown of skin: Secondary | ICD-10-CM

## 2020-04-27 DIAGNOSIS — M14672 Charcot's joint, left ankle and foot: Secondary | ICD-10-CM

## 2020-04-27 NOTE — Progress Notes (Signed)
Office Visit Note   [name]           Date of Birth: Jun 13, 1945           MRN: [id number] Visit Date: 04/27/2020              Requested by: Vivi Barrack, MD [address] [place],  [place] [address] PCP: Vivi Barrack, MD  Chief Complaint  Patient presents with  . 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  Patient states she did go to the beach this past weekend does not feel like she has been up on her foot more but she states she is developed new drainage and odor from the chronic ulcers.  Patient states that she is down to 1 mg a day of prednisone for her dermatitis.  She is currently in a postoperative shoe.  Assessment & Plan: Visit Diagnoses:  1. Ulcer of left foot, limited to breakdown of skin ([place])   2. Charcot's joint, left ankle and foot     Plan: Continue with the postoperative shoe continue with her routine wound care.  Follow-Up Instructions: Return in about 3 weeks (around 05/18/2020).   Ortho Exam  Patient is alert, oriented, no adenopathy, well-dressed, normal affect, normal respiratory effort. Examination patient has no redness no cellulitis no signs of infection of the left foot.  She has blistering around the ulcer beneath the Charcot collapse left foot.  There is increased ulceration around the fourth metatarsal head.  After informed consent a 10 blade knife was used to debride the skin and soft tissue from the Charcot ulcer beneath the midfoot.  Prior to debridement there was a blister and ulcer 1 cm diameter after debridement the ulcer was 3 x 2 cm and 1 mm deep this had good healthy granulation tissue no depth this did not probe to bone or tendon this was touched with silver nitrate and a Band-Aid was applied.  Patient also has a Wagner grade 1 ulcer that was 5 mm in diameter prior to debridement after debridement the ulcer was 1 cm in  diameter and 3 mm deep without exposed bone or tendon beneath the fourth metatarsal head.  There is no clinical signs of active Charcot arthropathy.  No signs of abscess or cellulitis.  Imaging: No results found. No images are attached to the encounter.  Labs: Lab Results  Component Value Date   HGBA1C 8.0 (A) 03/18/2020   HGBA1C 8.2 (A) 12/19/2019   HGBA1C 7.4 (A) 11/23/2018   ESRSEDRATE 51 (H) 12/24/2019   ESRSEDRATE 36 (H) 02/24/2017   CRP 13.2 (H) 12/24/2019   CRP 1.2 (H) 02/24/2017   LABURIC 5.5 09/01/2015   LABORGA GROUP B STREP (S.AGALACTIAE) ISOLATED 12/26/2014     Lab Results  Component Value Date   ALBUMIN 3.6 12/26/2019   ALBUMIN 4.0 12/19/2019   ALBUMIN 3.8 05/19/2017   PREALBUMIN 19.4 03/05/2012    No results found for: MG No results found for: VD25OH  Lab Results  Component Value Date   PREALBUMIN 19.4 03/05/2012   CBC EXTENDED Latest Ref Rng & Units 03/05/2020 12/26/2019 12/24/2019  WBC 4.0 - 10.5 K/uL 13.8(H) 16.6(H) 13.0(H)  RBC 3.87 - 5.11 MIL/uL 4.24 4.03 4.32  HGB 12.0 - 15.0 g/dL 11.3(L) 11.0(L) 11.6(L)  HCT 36.0 - 46.0 % 36.2 35.4(L) 36.3  PLT 150 - 400 K/uL  421(H) 532(H) 490(H)  NEUTROABS 1.7 - 7.7 K/uL 9.6(H) 10.3(H) 11,323(H)  LYMPHSABS 0.7 - 4.0 K/uL 3.0 4.8(H) 1,313     There is no height or weight on file to calculate BMI.  Orders:  No orders of the defined types were placed in this encounter.  No orders of the defined types were placed in this encounter.    Procedures: No procedures performed  Clinical Data: No additional findings.  ROS:  All other systems negative, except as noted in the HPI. Review of Systems  Objective: Vital Signs: There were no vitals taken for this visit.  Specialty Comments:  No specialty comments available.  PMFS History: Patient Active Problem List   Diagnosis Date Noted  . Memory loss 11/21/2019  . Mild nonproliferative diabetic retinopathy of both eyes ([place]) 10/23/2019  . Nuclear  sclerotic cataract of both eyes 10/23/2019  . Posterior vitreous detachment of right eye 10/23/2019  . Retinal hemorrhage of left eye 10/23/2019  . Hyperlipidemia associated with type 2 diabetes mellitus ([place]) 11/30/2017  . Impingement syndrome of right shoulder 07/06/2017  . Iron deficiency anemia 05/11/2017  . Morbid obesity ([place]) 04/24/2017  . Anemia 02/24/2017  . Baker's cyst 07/10/2016  . Onychomycosis 12/07/2015  . Upper airway cough syndrome 03/05/2014  . History of amputation of lesser toe of right foot ([place]) 04/15/2013  . Hypertension associated with diabetes ([place]) 12/05/2006  . Uncontrolled type 2 diabetes mellitus with peripheral circulatory disorder ([place]) 10/12/2006  . Dyslipidemia associated with type 2 diabetes mellitus ([place]) 10/12/2006  . Allergic rhinitis 10/12/2006  . GERD - Followed by Dr. Fuller Plan 10/12/2006  . IRRITABLE BOWEL SYNDROME - followed by Dr. Fuller Plan 10/12/2006  . Peripheral neuropathy 10/11/2006  . ALOPECIA NEC 10/11/2006   Past Medical History:  Diagnosis Date  . Alopecia   . Anemia, mild   . Arthritis   . Chronic cough    sees pulmonologist  . Chronic pain    chest wall and abd - s/p extensive eval  . Diabetes mellitus with neuropathy ([place])    sees endocrine  . Diverticulosis   . Fatty liver   . GERD (gastroesophageal reflux disease)    takes Nexium bid, hx erosive esophagitis  . Headache(784.0)    occasionally;r/t sinus   . History of colon polyps   . HTN (hypertension)   . Hx of amputation of lesser toe ([place])    sees podiatrist  . Hyperlipemia   . IBS (irritable bowel syndrome)   . Insomnia    takes Elavil nightly  . Joint pain   . Neuropathy   . Osteomyelitis ([place])   . Pneumonia   . PONV (postoperative nausea and vomiting)   . Seasonal allergies    takes Zyrtec daily  . Sinus tachycardia     Family History  Problem Relation Age of Onset  . Lung cancer Mother 76       smoked heavily  . Emphysema Mother   . Hypertension Father    . Hyperlipidemia Father   . Diabetes Father   . Coronary artery disease Father   . Dementia Father   . COPD Father        smoked  . Stomach cancer Paternal Aunt   . Brain cancer Paternal Uncle   . Irritable bowel syndrome Other        Several family members on fathers side   . Diabetes Other   . Stomach cancer Maternal Aunt   . Lung cancer Paternal Uncle   . Heart  disease Paternal Uncle   . Colon cancer Neg Hx     Past Surgical History:  Procedure Laterality Date  . AMPUTATION  12/28/2011   Procedure: AMPUTATION DIGIT;  Surgeon: Newt Minion, MD;  Location: [facility];  Service: Orthopedics;  Laterality: Right;  Right Foot 2nd Toe Amputation at MTP (metatarsophalangeal joint)  . AMPUTATION Right 04/25/2012   Procedure: Right Foot 3rd Toe Amputation;  Surgeon: Newt Minion, MD;  Location: [facility];  Service: Orthopedics;  Laterality: Right;  Right Foot Third Toe Amputation   . AMPUTATION Right 07/27/2012   Procedure: Right 4th Toe Amputation at Metatarsophalangeal;  Surgeon: Newt Minion, MD;  Location: [facility];  Service: Orthopedics;  Laterality: Right;  Right 4th Toe Amputation at Metatarsophalangeal  . AMPUTATION Left 07/15/2016   Procedure: Left 2nd Ray Amputation;  Surgeon: Newt Minion, MD;  Location: [facility];  Service: Orthopedics;  Laterality: Left;  . AMPUTATION Left 11/18/2016   Procedure: Left 3rd and 4th Ray Amputation;  Surgeon: Newt Minion, MD;  Location: [facility];  Service: Orthopedics;  Laterality: Left;  . AMPUTATION Right 03/29/2017   Procedure: RIGHT FOOT 3RD AND 4TH RAY AMPUTATION;  Surgeon: Newt Minion, MD;  Location: [facility];  Service: Orthopedics;  Laterality: Right;  . COLONOSCOPY    . LAPAROSCOPIC APPENDECTOMY  01/05/2011   Procedure: APPENDECTOMY LAPAROSCOPIC;  Surgeon: Pedro Earls, MD;  [facility];  Service: General;  Laterality: N/A;  . OOPHORECTOMY  2001  . ROTATOR CUFF REPAIR Right    x 2  . TUBAL LIGATION    . VAGINAL HYSTERECTOMY  2001   Social  History   Occupational History  . Occupation: Retired   Tobacco Use  . Smoking status: Never Smoker  . Smokeless tobacco: Never Used  Vaping Use  . Vaping Use: Never used  Substance and Sexual Activity  . Alcohol use: No  . Drug use: No  . Sexual activity: Yes    Birth control/protection: Surgical

## 2020-04-28 ENCOUNTER — Ambulatory Visit: Payer: Medicare Other | Admitting: Orthopedic Surgery

## 2020-05-20 DIAGNOSIS — M310 Hypersensitivity angiitis: Secondary | ICD-10-CM | POA: Diagnosis not present

## 2020-05-21 ENCOUNTER — Ambulatory Visit (INDEPENDENT_AMBULATORY_CARE_PROVIDER_SITE_OTHER): Payer: Medicare Other | Admitting: Orthopedic Surgery

## 2020-05-21 DIAGNOSIS — M14672 Charcot's joint, left ankle and foot: Secondary | ICD-10-CM

## 2020-05-21 DIAGNOSIS — L97521 Non-pressure chronic ulcer of other part of left foot limited to breakdown of skin: Secondary | ICD-10-CM | POA: Diagnosis not present

## 2020-05-22 ENCOUNTER — Encounter: Payer: Self-pay | Admitting: Orthopedic Surgery

## 2020-05-22 NOTE — Progress Notes (Signed)
Office Visit Note   [name]           Date of Birth: 02-24-1945           MRN: [id number] Visit Date: 05/21/2020              Requested by: Vivi Barrack, MD [address] [place],  [place] [address] PCP: Vivi Barrack, MD  Chief Complaint  Patient presents with  . Left Foot - Follow-up      HPI: Patient is a 75 year old woman with Charcot collapse of the left foot she has had a Wagner grade 1 ulcer beneath the Charcot rocker-bottom deformity.  Patient wears protective shoe wear with custom orthotics.  Assessment & Plan: Visit Diagnoses:  1. Ulcer of left foot, limited to breakdown of skin ([place])   2. Charcot's joint, left ankle and foot     Plan: Continue with her current shoe wear and orthotics.  Follow-Up Instructions: Return in about 4 weeks (around 06/18/2020).   Ortho Exam  Patient is alert, oriented, no adenopathy, well-dressed, normal affect, normal respiratory effort. Examination the ulcer looks much better it is smaller there is minimal drainage no redness no cellulitis no exposed bone or tendon no active Charcot process.  After informed consent a 10 blade knife was used to debride the skin and soft tissue back to healthy viable granulation tissue this was touched with silver nitrate.  The ulcer was 1 cm diameter 1 mm deep prior to debridement after debridement the ulcer was 2 cm in diameter 1 mm deep.  Patient also has a new ulcer beneath the fourth metatarsal head and this was also debrided and was approximately 5 mm in diameter.  Imaging: No results found. No images are attached to the encounter.  Labs: Lab Results  Component Value Date   HGBA1C 8.0 (A) 03/18/2020   HGBA1C 8.2 (A) 12/19/2019   HGBA1C 7.4 (A) 11/23/2018   ESRSEDRATE 51 (H) 12/24/2019   ESRSEDRATE 36 (H) 02/24/2017   CRP 13.2 (H) 12/24/2019   CRP 1.2 (H) 02/24/2017   LABURIC 5.5 09/01/2015   LABORGA GROUP B STREP (S.AGALACTIAE) ISOLATED 12/26/2014     Lab Results   Component Value Date   ALBUMIN 3.6 12/26/2019   ALBUMIN 4.0 12/19/2019   ALBUMIN 3.8 05/19/2017   PREALBUMIN 19.4 03/05/2012    No results found for: MG No results found for: VD25OH  Lab Results  Component Value Date   PREALBUMIN 19.4 03/05/2012   CBC EXTENDED Latest Ref Rng & Units 03/05/2020 12/26/2019 12/24/2019  WBC 4.0 - 10.5 K/uL 13.8(H) 16.6(H) 13.0(H)  RBC 3.87 - 5.11 MIL/uL 4.24 4.03 4.32  HGB 12.0 - 15.0 g/dL 11.3(L) 11.0(L) 11.6(L)  HCT 36.0 - 46.0 % 36.2 35.4(L) 36.3  PLT 150 - 400 K/uL 421(H) 532(H) 490(H)  NEUTROABS 1.7 - 7.7 K/uL 9.6(H) 10.3(H) 11,323(H)  LYMPHSABS 0.7 - 4.0 K/uL 3.0 4.8(H) 1,313     There is no height or weight on file to calculate BMI.  Orders:  No orders of the defined types were placed in this encounter.  No orders of the defined types were placed in this encounter.    Procedures: No procedures performed  Clinical Data: No additional findings.  ROS:  All other systems negative, except as noted in the HPI. Review of Systems  Objective: Vital Signs: There were no vitals taken for this visit.  Specialty Comments:  No specialty comments available.  PMFS History: Patient Active Problem List   Diagnosis  Date Noted  . Memory loss 11/21/2019  . Mild nonproliferative diabetic retinopathy of both eyes ([place]) 10/23/2019  . Nuclear sclerotic cataract of both eyes 10/23/2019  . Posterior vitreous detachment of right eye 10/23/2019  . Retinal hemorrhage of left eye 10/23/2019  . Hyperlipidemia associated with type 2 diabetes mellitus ([place]) 11/30/2017  . Impingement syndrome of right shoulder 07/06/2017  . Iron deficiency anemia 05/11/2017  . Morbid obesity ([place]) 04/24/2017  . Anemia 02/24/2017  . Baker's cyst 07/10/2016  . Onychomycosis 12/07/2015  . Upper airway cough syndrome 03/05/2014  . History of amputation of lesser toe of right foot ([place]) 04/15/2013  . Hypertension associated with diabetes ([place]) 12/05/2006  .  Uncontrolled type 2 diabetes mellitus with peripheral circulatory disorder ([place]) 10/12/2006  . Dyslipidemia associated with type 2 diabetes mellitus ([place]) 10/12/2006  . Allergic rhinitis 10/12/2006  . GERD - Followed by Dr. Fuller Plan 10/12/2006  . IRRITABLE BOWEL SYNDROME - followed by Dr. Fuller Plan 10/12/2006  . Peripheral neuropathy 10/11/2006  . ALOPECIA NEC 10/11/2006   Past Medical History:  Diagnosis Date  . Alopecia   . Anemia, mild   . Arthritis   . Chronic cough    sees pulmonologist  . Chronic pain    chest wall and abd - s/p extensive eval  . Diabetes mellitus with neuropathy ([place])    sees endocrine  . Diverticulosis   . Fatty liver   . GERD (gastroesophageal reflux disease)    takes Nexium bid, hx erosive esophagitis  . Headache(784.0)    occasionally;r/t sinus   . History of colon polyps   . HTN (hypertension)   . Hx of amputation of lesser toe ([place])    sees podiatrist  . Hyperlipemia   . IBS (irritable bowel syndrome)   . Insomnia    takes Elavil nightly  . Joint pain   . Neuropathy   . Osteomyelitis ([place])   . Pneumonia   . PONV (postoperative nausea and vomiting)   . Seasonal allergies    takes Zyrtec daily  . Sinus tachycardia     Family History  Problem Relation Age of Onset  . Lung cancer Mother 27       smoked heavily  . Emphysema Mother   . Hypertension Father   . Hyperlipidemia Father   . Diabetes Father   . Coronary artery disease Father   . Dementia Father   . COPD Father        smoked  . Stomach cancer Paternal Aunt   . Brain cancer Paternal Uncle   . Irritable bowel syndrome Other        Several family members on fathers side   . Diabetes Other   . Stomach cancer Maternal Aunt   . Lung cancer Paternal Uncle   . Heart disease Paternal Uncle   . Colon cancer Neg Hx     Past Surgical History:  Procedure Laterality Date  . AMPUTATION  12/28/2011   Procedure: AMPUTATION DIGIT;  Surgeon: Newt Minion, MD;  Location: [facility];  Service:  Orthopedics;  Laterality: Right;  Right Foot 2nd Toe Amputation at MTP (metatarsophalangeal joint)  . AMPUTATION Right 04/25/2012   Procedure: Right Foot 3rd Toe Amputation;  Surgeon: Newt Minion, MD;  Location: [facility];  Service: Orthopedics;  Laterality: Right;  Right Foot Third Toe Amputation   . AMPUTATION Right 07/27/2012   Procedure: Right 4th Toe Amputation at Metatarsophalangeal;  Surgeon: Newt Minion, MD;  Location: [facility];  Service: Orthopedics;  Laterality: Right;  Right 4th Toe Amputation at Metatarsophalangeal  . AMPUTATION Left 07/15/2016   Procedure: Left 2nd Ray Amputation;  Surgeon: Newt Minion, MD;  Location: [facility];  Service: Orthopedics;  Laterality: Left;  . AMPUTATION Left 11/18/2016   Procedure: Left 3rd and 4th Ray Amputation;  Surgeon: Newt Minion, MD;  Location: [facility];  Service: Orthopedics;  Laterality: Left;  . AMPUTATION Right 03/29/2017   Procedure: RIGHT FOOT 3RD AND 4TH RAY AMPUTATION;  Surgeon: Newt Minion, MD;  Location: [facility];  Service: Orthopedics;  Laterality: Right;  . COLONOSCOPY    . LAPAROSCOPIC APPENDECTOMY  01/05/2011   Procedure: APPENDECTOMY LAPAROSCOPIC;  Surgeon: Pedro Earls, MD;  [facility];  Service: General;  Laterality: N/A;  . OOPHORECTOMY  2001  . ROTATOR CUFF REPAIR Right    x 2  . TUBAL LIGATION    . VAGINAL HYSTERECTOMY  2001   Social History   Occupational History  . Occupation: Retired   Tobacco Use  . Smoking status: Never Smoker  . Smokeless tobacco: Never Used  Vaping Use  . Vaping Use: Never used  Substance and Sexual Activity  . Alcohol use: No  . Drug use: No  . Sexual activity: Yes    Birth control/protection: Surgical

## 2020-05-28 ENCOUNTER — Telehealth: Payer: Self-pay

## 2020-05-28 NOTE — Telephone Encounter (Signed)
Xia.Dress- Patient asked for Erin Z.

## 2020-05-29 ENCOUNTER — Other Ambulatory Visit: Payer: Self-pay

## 2020-05-29 ENCOUNTER — Inpatient Hospital Stay (HOSPITAL_COMMUNITY)
Admission: EM | Admit: 2020-05-29 | Discharge: 2020-06-02 | DRG: 300 | Disposition: A | Discharge status: Home / Self Care | Payer: Medicare Other | Attending: Internal Medicine | Admitting: Internal Medicine

## 2020-05-29 ENCOUNTER — Ambulatory Visit: Payer: Self-pay

## 2020-05-29 ENCOUNTER — Ambulatory Visit (INDEPENDENT_AMBULATORY_CARE_PROVIDER_SITE_OTHER): Payer: Medicare Other | Admitting: Family

## 2020-05-29 ENCOUNTER — Encounter: Payer: Self-pay | Admitting: Family

## 2020-05-29 ENCOUNTER — Encounter (HOSPITAL_COMMUNITY): Payer: Self-pay | Admitting: *Deleted

## 2020-05-29 ENCOUNTER — Inpatient Hospital Stay (HOSPITAL_COMMUNITY): Payer: Medicare Other

## 2020-05-29 DIAGNOSIS — E1169 Type 2 diabetes mellitus with other specified complication: Secondary | ICD-10-CM | POA: Diagnosis present

## 2020-05-29 DIAGNOSIS — Z6838 Body mass index (BMI) 38.0-38.9, adult: Secondary | ICD-10-CM

## 2020-05-29 DIAGNOSIS — L03032 Cellulitis of left toe: Secondary | ICD-10-CM | POA: Diagnosis present

## 2020-05-29 DIAGNOSIS — L0390 Cellulitis, unspecified: Secondary | ICD-10-CM

## 2020-05-29 DIAGNOSIS — E1165 Type 2 diabetes mellitus with hyperglycemia: Secondary | ICD-10-CM | POA: Diagnosis not present

## 2020-05-29 DIAGNOSIS — G8929 Other chronic pain: Secondary | ICD-10-CM | POA: Diagnosis present

## 2020-05-29 DIAGNOSIS — K219 Gastro-esophageal reflux disease without esophagitis: Secondary | ICD-10-CM | POA: Diagnosis present

## 2020-05-29 DIAGNOSIS — E1142 Type 2 diabetes mellitus with diabetic polyneuropathy: Secondary | ICD-10-CM | POA: Diagnosis present

## 2020-05-29 DIAGNOSIS — M1288 Other specific arthropathies, not elsewhere classified, other specified site: Secondary | ICD-10-CM | POA: Diagnosis not present

## 2020-05-29 DIAGNOSIS — M79672 Pain in left foot: Secondary | ICD-10-CM

## 2020-05-29 DIAGNOSIS — E11621 Type 2 diabetes mellitus with foot ulcer: Secondary | ICD-10-CM | POA: Diagnosis present

## 2020-05-29 DIAGNOSIS — M868X7 Other osteomyelitis, ankle and foot: Secondary | ICD-10-CM | POA: Diagnosis present

## 2020-05-29 DIAGNOSIS — Z79899 Other long term (current) drug therapy: Secondary | ICD-10-CM

## 2020-05-29 DIAGNOSIS — Z20822 Contact with and (suspected) exposure to covid-19: Secondary | ICD-10-CM | POA: Diagnosis present

## 2020-05-29 DIAGNOSIS — L659 Nonscarring hair loss, unspecified: Secondary | ICD-10-CM | POA: Diagnosis present

## 2020-05-29 DIAGNOSIS — E113293 Type 2 diabetes mellitus with mild nonproliferative diabetic retinopathy without macular edema, bilateral: Secondary | ICD-10-CM | POA: Diagnosis present

## 2020-05-29 DIAGNOSIS — Z885 Allergy status to narcotic agent status: Secondary | ICD-10-CM

## 2020-05-29 DIAGNOSIS — R000 Tachycardia, unspecified: Secondary | ICD-10-CM | POA: Diagnosis present

## 2020-05-29 DIAGNOSIS — E785 Hyperlipidemia, unspecified: Secondary | ICD-10-CM | POA: Diagnosis present

## 2020-05-29 DIAGNOSIS — M86472 Chronic osteomyelitis with draining sinus, left ankle and foot: Secondary | ICD-10-CM | POA: Diagnosis not present

## 2020-05-29 DIAGNOSIS — Z83438 Family history of other disorder of lipoprotein metabolism and other lipidemia: Secondary | ICD-10-CM

## 2020-05-29 DIAGNOSIS — E1151 Type 2 diabetes mellitus with diabetic peripheral angiopathy without gangrene: Secondary | ICD-10-CM | POA: Diagnosis present

## 2020-05-29 DIAGNOSIS — M869 Osteomyelitis, unspecified: Secondary | ICD-10-CM | POA: Diagnosis not present

## 2020-05-29 DIAGNOSIS — Z8719 Personal history of other diseases of the digestive system: Secondary | ICD-10-CM | POA: Diagnosis not present

## 2020-05-29 DIAGNOSIS — M1990 Unspecified osteoarthritis, unspecified site: Secondary | ICD-10-CM | POA: Diagnosis present

## 2020-05-29 DIAGNOSIS — K760 Fatty (change of) liver, not elsewhere classified: Secondary | ICD-10-CM | POA: Diagnosis present

## 2020-05-29 DIAGNOSIS — E11610 Type 2 diabetes mellitus with diabetic neuropathic arthropathy: Secondary | ICD-10-CM | POA: Diagnosis not present

## 2020-05-29 DIAGNOSIS — Z794 Long term (current) use of insulin: Secondary | ICD-10-CM

## 2020-05-29 DIAGNOSIS — L03116 Cellulitis of left lower limb: Secondary | ICD-10-CM | POA: Diagnosis not present

## 2020-05-29 DIAGNOSIS — I10 Essential (primary) hypertension: Secondary | ICD-10-CM | POA: Diagnosis present

## 2020-05-29 DIAGNOSIS — R413 Other amnesia: Secondary | ICD-10-CM | POA: Diagnosis present

## 2020-05-29 DIAGNOSIS — Z888 Allergy status to other drugs, medicaments and biological substances status: Secondary | ICD-10-CM

## 2020-05-29 DIAGNOSIS — Z833 Family history of diabetes mellitus: Secondary | ICD-10-CM

## 2020-05-29 DIAGNOSIS — K589 Irritable bowel syndrome without diarrhea: Secondary | ICD-10-CM | POA: Diagnosis present

## 2020-05-29 DIAGNOSIS — E119 Type 2 diabetes mellitus without complications: Secondary | ICD-10-CM | POA: Diagnosis present

## 2020-05-29 DIAGNOSIS — IMO0002 Reserved for concepts with insufficient information to code with codable children: Secondary | ICD-10-CM

## 2020-05-29 DIAGNOSIS — Z825 Family history of asthma and other chronic lower respiratory diseases: Secondary | ICD-10-CM

## 2020-05-29 DIAGNOSIS — L0889 Other specified local infections of the skin and subcutaneous tissue: Secondary | ICD-10-CM | POA: Diagnosis present

## 2020-05-29 DIAGNOSIS — Z800 Family history of malignant neoplasm of digestive organs: Secondary | ICD-10-CM

## 2020-05-29 DIAGNOSIS — Z89422 Acquired absence of other left toe(s): Secondary | ICD-10-CM | POA: Diagnosis not present

## 2020-05-29 DIAGNOSIS — R21 Rash and other nonspecific skin eruption: Secondary | ICD-10-CM | POA: Diagnosis present

## 2020-05-29 DIAGNOSIS — Z8249 Family history of ischemic heart disease and other diseases of the circulatory system: Secondary | ICD-10-CM

## 2020-05-29 DIAGNOSIS — Z89421 Acquired absence of other right toe(s): Secondary | ICD-10-CM

## 2020-05-29 DIAGNOSIS — D649 Anemia, unspecified: Secondary | ICD-10-CM | POA: Diagnosis present

## 2020-05-29 DIAGNOSIS — Z808 Family history of malignant neoplasm of other organs or systems: Secondary | ICD-10-CM

## 2020-05-29 DIAGNOSIS — Z801 Family history of malignant neoplasm of trachea, bronchus and lung: Secondary | ICD-10-CM

## 2020-05-29 LAB — CBC WITH DIFFERENTIAL/PLATELET
Abs Immature Granulocytes: 0.05 10*3/uL (ref 0.00–0.07)
Basophils Absolute: 0.1 10*3/uL (ref 0.0–0.1)
Basophils Relative: 0 %
Eosinophils Absolute: 0.1 10*3/uL (ref 0.0–0.5)
Eosinophils Relative: 1 %
HCT: 35.9 % — ABNORMAL LOW (ref 36.0–46.0)
Hemoglobin: 11.2 g/dL — ABNORMAL LOW (ref 12.0–15.0)
Immature Granulocytes: 0 %
Lymphocytes Relative: 14 %
Lymphs Abs: 2 10*3/uL (ref 0.7–4.0)
MCH: 27 pg (ref 26.0–34.0)
MCHC: 31.2 g/dL (ref 30.0–36.0)
MCV: 86.5 fL (ref 80.0–100.0)
Monocytes Absolute: 0.8 10*3/uL (ref 0.1–1.0)
Monocytes Relative: 6 %
Neutro Abs: 10.9 10*3/uL — ABNORMAL HIGH (ref 1.7–7.7)
Neutrophils Relative %: 79 %
Platelets: 393 10*3/uL (ref 150–400)
RBC: 4.15 MIL/uL (ref 3.87–5.11)
RDW: 13.6 % (ref 11.5–15.5)
WBC: 13.9 10*3/uL — ABNORMAL HIGH (ref 4.0–10.5)
nRBC: 0 % (ref 0.0–0.2)

## 2020-05-29 LAB — LACTIC ACID, PLASMA
Lactic Acid, Venous: 2.3 mmol/L (ref 0.5–1.9)
Lactic Acid, Venous: 2.2 mmol/L (ref 0.5–1.9)

## 2020-05-29 LAB — COMPREHENSIVE METABOLIC PANEL
ALT: 10 U/L (ref 0–44)
AST: 13 U/L — ABNORMAL LOW (ref 15–41)
Albumin: 3.4 g/dL — ABNORMAL LOW (ref 3.5–5.0)
Alkaline Phosphatase: 48 U/L (ref 38–126)
Anion gap: 10 (ref 5–15)
BUN: 15 mg/dL (ref 8–23)
CO2: 28 mmol/L (ref 22–32)
Calcium: 9.2 mg/dL (ref 8.9–10.3)
Chloride: 97 mmol/L — ABNORMAL LOW (ref 98–111)
Creatinine, Ser: 1.31 mg/dL — ABNORMAL HIGH (ref 0.44–1.00)
GFR, Estimated: 43 mL/min — ABNORMAL LOW (ref >60)
Glucose, Bld: 350 mg/dL — ABNORMAL HIGH (ref 70–99)
Potassium: 3.4 mmol/L — ABNORMAL LOW (ref 3.5–5.1)
Sodium: 135 mmol/L (ref 135–145)
Total Bilirubin: 0.8 mg/dL (ref 0.3–1.2)
Total Protein: 7.4 g/dL (ref 6.5–8.1)

## 2020-05-29 LAB — GLUCOSE, CAPILLARY
Glucose-Capillary: 126 mg/dL — ABNORMAL HIGH (ref 70–99)
Glucose-Capillary: 74 mg/dL (ref 70–99)

## 2020-05-29 LAB — PROTIME-INR
INR: 1.2 (ref 0.8–1.2)
Prothrombin Time: 14.7 seconds (ref 11.4–15.2)

## 2020-05-29 LAB — SARS CORONAVIRUS 2 (TAT 6-24 HRS): SARS Coronavirus 2: NEGATIVE

## 2020-05-29 LAB — HEMOGLOBIN A1C
Hgb A1c MFr Bld: 8.4 % — ABNORMAL HIGH (ref 4.8–5.6)
Mean Plasma Glucose: 194.38 mg/dL

## 2020-05-29 MED ORDER — ACETAMINOPHEN 325 MG PO TABS
650.0000 mg | ORAL_TABLET | Freq: Four times a day (QID) | ORAL | Status: DC | PRN
  Administered 2020-05-29 – 2020-06-01 (×5): 650 mg via ORAL
  Filled 2020-05-29 (×5): qty 2

## 2020-05-29 MED ORDER — DAPAGLIFLOZIN PROPANEDIOL 5 MG PO TABS
5.0000 mg | ORAL_TABLET | Freq: Every day | ORAL | Status: DC
  Filled 2020-05-29 (×2): qty 1

## 2020-05-29 MED ORDER — VANCOMYCIN HCL 1250 MG/250ML IV SOLN
1250.0000 mg | INTRAVENOUS | Status: DC
  Administered 2020-05-31: 1250 mg via INTRAVENOUS
  Filled 2020-05-29 (×2): qty 250

## 2020-05-29 MED ORDER — INSULIN ASPART 100 UNIT/ML IJ SOLN
0.0000 [IU] | Freq: Three times a day (TID) | INTRAMUSCULAR | Status: DC
  Administered 2020-05-29: 2 [IU] via SUBCUTANEOUS
  Administered 2020-05-30: 11 [IU] via SUBCUTANEOUS
  Administered 2020-05-30: 3 [IU] via SUBCUTANEOUS
  Administered 2020-05-30 – 2020-05-31 (×2): 8 [IU] via SUBCUTANEOUS

## 2020-05-29 MED ORDER — PANTOPRAZOLE SODIUM 40 MG PO TBEC
40.0000 mg | DELAYED_RELEASE_TABLET | Freq: Every day | ORAL | Status: DC
  Administered 2020-05-30 – 2020-06-02 (×4): 40 mg via ORAL
  Filled 2020-05-29 (×4): qty 1

## 2020-05-29 MED ORDER — HYDRALAZINE HCL 25 MG PO TABS: 25.0000 mg | ORAL_TABLET | Freq: Four times a day (QID) | ORAL | Status: DC | PRN

## 2020-05-29 MED ORDER — HYDROMORPHONE HCL 1 MG/ML IJ SOLN
0.5000 mg | INTRAMUSCULAR | Status: DC | PRN
  Filled 2020-05-29: qty 1

## 2020-05-29 MED ORDER — AZELASTINE HCL 0.1 % NA SOLN
2.0000 | Freq: Every day | NASAL | Status: DC | PRN
  Filled 2020-05-29: qty 30

## 2020-05-29 MED ORDER — METOPROLOL SUCCINATE ER 25 MG PO TB24
25.0000 mg | ORAL_TABLET | Freq: Every day | ORAL | Status: DC
  Administered 2020-05-30 – 2020-06-02 (×4): 25 mg via ORAL
  Filled 2020-05-29 (×4): qty 1

## 2020-05-29 MED ORDER — INSULIN ASPART 100 UNIT/ML IJ SOLN
6.0000 [IU] | Freq: Three times a day (TID) | INTRAMUSCULAR | Status: DC
  Administered 2020-05-29 – 2020-06-02 (×12): 6 [IU] via SUBCUTANEOUS

## 2020-05-29 MED ORDER — DICYCLOMINE HCL 10 MG PO CAPS: 10.0000 mg | ORAL_CAPSULE | Freq: Three times a day (TID) | ORAL | Status: DC | PRN

## 2020-05-29 MED ORDER — ENOXAPARIN SODIUM 60 MG/0.6ML IJ SOSY
50.0000 mg | PREFILLED_SYRINGE | INTRAMUSCULAR | Status: DC
  Administered 2020-05-29 – 2020-06-01 (×4): 50 mg via SUBCUTANEOUS
  Filled 2020-05-29: qty 0.5
  Filled 2020-05-29 (×4): qty 0.6

## 2020-05-29 MED ORDER — POTASSIUM CHLORIDE CRYS ER 20 MEQ PO TBCR
40.0000 meq | EXTENDED_RELEASE_TABLET | Freq: Once | ORAL | Status: AC
  Administered 2020-05-29: 40 meq via ORAL
  Filled 2020-05-29: qty 2

## 2020-05-29 MED ORDER — ACETAMINOPHEN 650 MG RE SUPP: 650.0000 mg | Freq: Four times a day (QID) | RECTAL | Status: DC | PRN

## 2020-05-29 MED ORDER — RISAQUAD PO CAPS
1.0000 | ORAL_CAPSULE | Freq: Every day | ORAL | Status: DC
  Administered 2020-05-30 – 2020-06-02 (×4): 1 via ORAL
  Filled 2020-05-29 (×4): qty 1

## 2020-05-29 MED ORDER — ROSUVASTATIN CALCIUM 20 MG PO TABS
20.0000 mg | ORAL_TABLET | Freq: Every day | ORAL | Status: DC
  Administered 2020-05-30 – 2020-06-02 (×4): 20 mg via ORAL
  Filled 2020-05-29 (×4): qty 1

## 2020-05-29 MED ORDER — INSULIN GLARGINE 100 UNIT/ML ~~LOC~~ SOLN
20.0000 [IU] | Freq: Every day | SUBCUTANEOUS | Status: DC
  Administered 2020-05-30 – 2020-06-02 (×4): 20 [IU] via SUBCUTANEOUS
  Filled 2020-05-29 (×4): qty 0.2

## 2020-05-29 MED ORDER — ONDANSETRON HCL 4 MG/2ML IJ SOLN: 4.0000 mg | Freq: Four times a day (QID) | INTRAMUSCULAR | Status: DC | PRN

## 2020-05-29 MED ORDER — ONDANSETRON HCL 4 MG PO TABS: 4.0000 mg | ORAL_TABLET | Freq: Four times a day (QID) | ORAL | Status: DC | PRN

## 2020-05-29 MED ORDER — ACETAMINOPHEN 500 MG PO TABS: 500.0000 mg | ORAL_TABLET | Freq: Three times a day (TID) | ORAL | Status: DC | PRN

## 2020-05-29 MED ORDER — INSULIN GLARGINE 100 UNIT/ML ~~LOC~~ SOLN
10.0000 [IU] | Freq: Every day | SUBCUTANEOUS | Status: DC
  Filled 2020-05-29: qty 0.1

## 2020-05-29 MED ORDER — GADOBUTROL 1 MMOL/ML IV SOLN
7.5000 mL | Freq: Once | INTRAVENOUS | Status: AC | PRN
  Administered 2020-05-29: 7.5 mL via INTRAVENOUS

## 2020-05-29 MED ORDER — VANCOMYCIN HCL 2000 MG/400ML IV SOLN
2000.0000 mg | Freq: Once | INTRAVENOUS | Status: AC
  Administered 2020-05-29: 2000 mg via INTRAVENOUS
  Filled 2020-05-29: qty 400

## 2020-05-29 MED ORDER — LORATADINE 10 MG PO TABS
10.0000 mg | ORAL_TABLET | Freq: Every day | ORAL | Status: DC
  Administered 2020-05-30 – 2020-06-02 (×4): 10 mg via ORAL
  Filled 2020-05-29 (×4): qty 1

## 2020-05-29 MED ORDER — TRIAMCINOLONE ACETONIDE 0.5 % EX CREA
TOPICAL_CREAM | Freq: Two times a day (BID) | CUTANEOUS | Status: DC
  Filled 2020-05-29 (×3): qty 15

## 2020-05-29 MED ORDER — FLUOCINOLONE ACETONIDE 0.01 % OT OIL: 1.0000 [drp] | TOPICAL_OIL | Freq: Every day | OTIC | Status: DC | PRN

## 2020-05-29 MED ORDER — GABAPENTIN 100 MG PO CAPS
200.0000 mg | ORAL_CAPSULE | Freq: Every day | ORAL | Status: DC
  Administered 2020-05-29: 200 mg via ORAL
  Filled 2020-05-29: qty 2

## 2020-05-29 MED ORDER — GUAIFENESIN-DM 100-10 MG/5ML PO SYRP
5.0000 mL | ORAL_SOLUTION | Freq: Four times a day (QID) | ORAL | Status: DC | PRN
  Filled 2020-05-29: qty 5

## 2020-05-29 MED ORDER — PIPERACILLIN-TAZOBACTAM 3.375 G IVPB 30 MIN
3.3750 g | Freq: Three times a day (TID) | INTRAVENOUS | Status: DC
  Administered 2020-05-29: 3.375 g via INTRAVENOUS
  Filled 2020-05-29: qty 50

## 2020-05-29 MED ORDER — VITAMIN D 25 MCG (1000 UNIT) PO TABS
1000.0000 [IU] | ORAL_TABLET | Freq: Every day | ORAL | Status: DC
  Administered 2020-05-30 – 2020-06-02 (×4): 1000 [IU] via ORAL
  Filled 2020-05-29 (×4): qty 1

## 2020-05-29 MED ORDER — PIPERACILLIN-TAZOBACTAM 3.375 G IVPB
3.3750 g | Freq: Three times a day (TID) | INTRAVENOUS | Status: DC
  Administered 2020-05-29 – 2020-06-02 (×10): 3.375 g via INTRAVENOUS
  Filled 2020-05-29 (×13): qty 50

## 2020-05-29 MED ORDER — VITAMIN B-12 1000 MCG PO TABS
1000.0000 ug | ORAL_TABLET | Freq: Every day | ORAL | Status: DC
  Administered 2020-05-30 – 2020-06-02 (×4): 1000 ug via ORAL
  Filled 2020-05-29 (×4): qty 1

## 2020-05-29 MED ORDER — SODIUM CHLORIDE 0.9 % IV BOLUS
1000.0000 mL | Freq: Once | INTRAVENOUS | Status: AC
  Administered 2020-05-29: 1000 mL via INTRAVENOUS

## 2020-05-29 NOTE — Progress Notes (Signed)
Office Visit Note   [name]           Date of Birth: [birth year]           MRN: [id number] Visit Date: 05/29/2020              Requested by: Vivi Barrack, MD [address] [place],  [place] [address] PCP: Vivi Barrack, MD  Chief Complaint  Patient presents with  . Left Foot - Pain      HPI: The patient is a 75 year old woman who presents today for a concern of worsening pain to her right foot.  This is associated with swelling and drainage and warmth.  The swelling and redness and pain are coming up her lower leg.  She states she has pain up her medial thigh even into her groin and this feels like a deep bone pain.  She denies any numbness tingling no back pain  She is well-known to our office has Wagner grade 1 ulcers of her Charcot rocker-bottom deformity as well as to the third metatarsal head was seen last in the office about a week ago.   Assessment & Plan: Visit Diagnoses:  1. Pain in left foot     Plan: radiographs of left foot concerning for further destruction at base of first metatarsal. Consider MRI eval for osteomyelitis.   The patient will present to the emergency department for IV antibiotics.  Ascending cellulitis concern for osteomyelitis of the medial column.  Follow-Up Instructions: No follow-ups on file.   Ortho Exam  Patient is alert, oriented, no adenopathy, well-dressed, normal affect, normal respiratory effort.  On examination of the left lower extremity she does have erythema and swelling up to her the mid shin.  There is a warmth as well.  Over the base of the first metatarsal there is plantar ulceration and blistering.  This is draining some serosanguineous fluid.  Beneath the third metatarsal head and in the webspace there is some callused ulceration this is also draining serosanguineous fluid there is no palpable abscess no warmth no purulence.  Imaging: No results found. No images are attached to the encounter.  Labs: Lab  Results  Component Value Date   HGBA1C 8.0 (A) 03/18/2020   HGBA1C 8.2 (A) 12/19/2019   HGBA1C 7.4 (A) 11/23/2018   ESRSEDRATE 51 (H) 12/24/2019   ESRSEDRATE 36 (H) 02/24/2017   CRP 13.2 (H) 12/24/2019   CRP 1.2 (H) 02/24/2017   LABURIC 5.5 09/01/2015   LABORGA GROUP B STREP (S.AGALACTIAE) ISOLATED 12/26/2014     Lab Results  Component Value Date   ALBUMIN 3.6 12/26/2019   ALBUMIN 4.0 12/19/2019   ALBUMIN 3.8 05/19/2017   PREALBUMIN 19.4 03/05/2012    No results found for: MG No results found for: VD25OH  Lab Results  Component Value Date   PREALBUMIN 19.4 03/05/2012   CBC EXTENDED Latest Ref Rng & Units 03/05/2020 12/26/2019 12/24/2019  WBC 4.0 - 10.5 K/uL 13.8(H) 16.6(H) 13.0(H)  RBC 3.87 - 5.11 MIL/uL 4.24 4.03 4.32  HGB 12.0 - 15.0 g/dL 11.3(L) 11.0(L) 11.6(L)  HCT 36.0 - 46.0 % 36.2 35.4(L) 36.3  PLT 150 - 400 K/uL 421(H) 532(H) 490(H)  NEUTROABS 1.7 - 7.7 K/uL 9.6(H) 10.3(H) 11,323(H)  LYMPHSABS 0.7 - 4.0 K/uL 3.0 4.8(H) 1,313     There is no height or weight on file to calculate BMI.  Orders:  Orders Placed This Encounter  Procedures  . XR Foot 2 Views Left   No orders of  the defined types were placed in this encounter.    Procedures: No procedures performed  Clinical Data: No additional findings.  ROS:  All other systems negative, except as noted in the HPI. Review of Systems  Constitutional: Negative for chills and fever.  Cardiovascular: Positive for leg swelling.  Skin: Positive for color change and wound.    Objective: Vital Signs: There were no vitals taken for this visit.  Specialty Comments:  No specialty comments available.  PMFS History: Patient Active Problem List   Diagnosis Date Noted  . Memory loss 11/21/2019  . Mild nonproliferative diabetic retinopathy of both eyes ([place]) 10/23/2019  . Nuclear sclerotic cataract of both eyes 10/23/2019  . Posterior vitreous detachment of right eye 10/23/2019  . Retinal hemorrhage of  left eye 10/23/2019  . Hyperlipidemia associated with type 2 diabetes mellitus ([place]) 11/30/2017  . Impingement syndrome of right shoulder 07/06/2017  . Iron deficiency anemia 05/11/2017  . Morbid obesity ([place]) 04/24/2017  . Anemia 02/24/2017  . Baker's cyst 07/10/2016  . Onychomycosis 12/07/2015  . Upper airway cough syndrome 03/05/2014  . History of amputation of lesser toe of right foot ([place]) 04/15/2013  . Hypertension associated with diabetes ([place]) 12/05/2006  . Uncontrolled type 2 diabetes mellitus with peripheral circulatory disorder ([place]) 10/12/2006  . Dyslipidemia associated with type 2 diabetes mellitus ([place]) 10/12/2006  . Allergic rhinitis 10/12/2006  . GERD - Followed by Dr. Fuller Plan 10/12/2006  . IRRITABLE BOWEL SYNDROME - followed by Dr. Fuller Plan 10/12/2006  . Peripheral neuropathy 10/11/2006  . ALOPECIA NEC 10/11/2006   Past Medical History:  Diagnosis Date  . Alopecia   . Anemia, mild   . Arthritis   . Chronic cough    sees pulmonologist  . Chronic pain    chest wall and abd - s/p extensive eval  . Diabetes mellitus with neuropathy ([place])    sees endocrine  . Diverticulosis   . Fatty liver   . GERD (gastroesophageal reflux disease)    takes Nexium bid, hx erosive esophagitis  . Headache(784.0)    occasionally;r/t sinus   . History of colon polyps   . HTN (hypertension)   . Hx of amputation of lesser toe ([place])    sees podiatrist  . Hyperlipemia   . IBS (irritable bowel syndrome)   . Insomnia    takes Elavil nightly  . Joint pain   . Neuropathy   . Osteomyelitis ([place])   . Pneumonia   . PONV (postoperative nausea and vomiting)   . Seasonal allergies    takes Zyrtec daily  . Sinus tachycardia     Family History  Problem Relation Age of Onset  . Lung cancer Mother 62       smoked heavily  . Emphysema Mother   . Hypertension Father   . Hyperlipidemia Father   . Diabetes Father   . Coronary artery disease Father   . Dementia Father   . COPD Father         smoked  . Stomach cancer Paternal Aunt   . Brain cancer Paternal Uncle   . Irritable bowel syndrome Other        Several family members on fathers side   . Diabetes Other   . Stomach cancer Maternal Aunt   . Lung cancer Paternal Uncle   . Heart disease Paternal Uncle   . Colon cancer Neg Hx     Past Surgical History:  Procedure Laterality Date  . AMPUTATION  12/28/2011   Procedure: AMPUTATION DIGIT;  Surgeon: Newt Minion, MD;  Location: [facility];  Service: Orthopedics;  Laterality: Right;  Right Foot 2nd Toe Amputation at MTP (metatarsophalangeal joint)  . AMPUTATION Right 04/25/2012   Procedure: Right Foot 3rd Toe Amputation;  Surgeon: Newt Minion, MD;  Location: [facility];  Service: Orthopedics;  Laterality: Right;  Right Foot Third Toe Amputation   . AMPUTATION Right 07/27/2012   Procedure: Right 4th Toe Amputation at Metatarsophalangeal;  Surgeon: Newt Minion, MD;  Location: [facility];  Service: Orthopedics;  Laterality: Right;  Right 4th Toe Amputation at Metatarsophalangeal  . AMPUTATION Left 07/15/2016   Procedure: Left 2nd Ray Amputation;  Surgeon: Newt Minion, MD;  Location: [facility];  Service: Orthopedics;  Laterality: Left;  . AMPUTATION Left 11/18/2016   Procedure: Left 3rd and 4th Ray Amputation;  Surgeon: Newt Minion, MD;  Location: [facility];  Service: Orthopedics;  Laterality: Left;  . AMPUTATION Right 03/29/2017   Procedure: RIGHT FOOT 3RD AND 4TH RAY AMPUTATION;  Surgeon: Newt Minion, MD;  Location: [facility];  Service: Orthopedics;  Laterality: Right;  . COLONOSCOPY    . LAPAROSCOPIC APPENDECTOMY  01/05/2011   Procedure: APPENDECTOMY LAPAROSCOPIC;  Surgeon: Pedro Earls, MD;  [facility];  Service: General;  Laterality: N/A;  . OOPHORECTOMY  2001  . ROTATOR CUFF REPAIR Right    x 2  . TUBAL LIGATION    . VAGINAL HYSTERECTOMY  2001   Social History   Occupational History  . Occupation: Retired   Tobacco Use  . Smoking status: Never Smoker  . Smokeless tobacco:  Never Used  Vaping Use  . Vaping Use: Never used  Substance and Sexual Activity  . Alcohol use: No  . Drug use: No  . Sexual activity: Yes    Birth control/protection: Surgical

## 2020-05-29 NOTE — H&P (Signed)
History and Physical    Beth Holden WEX:[id number] DOB: [birth year] DOA: 05/29/2020  PCP: Vivi Barrack, MD (Confirm with patient/family/NH records and if not entered, this has to be entered at [facility] point of entry) Patient coming from: Home  I have personally briefly reviewed patient's old medical records in [place]  Chief Complaint: Left foot wound  HPI: Beth Holden is a 75 y.o. 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  Patient developed left midfoot ulcer 2 months ago, for which she has been following with podiatrist and seems like the wound has been stable until 2 days ago, but overnight, patient developed rash and discharge from the ulcer and left foot along with increasing feeling of soreness.  No fever chills.  She went to see podiatrist today, in office x-ray suspect osteomyelitis, and patient was sent for ED to do further evaluation including MRI. ED Course: WBC 13.9, potassium 2.4, lactic acid 2.3, glucose 350.  Review of Systems: As per HPI otherwise 14 point review of systems negative.   Past Medical History:  Diagnosis Date  . Alopecia   . Anemia, mild   . Arthritis   . Chronic cough    sees pulmonologist  . Chronic pain    chest wall and abd - s/p extensive eval  . Diabetes mellitus with neuropathy ([place])    sees endocrine  . Diverticulosis   . Fatty liver   . GERD (gastroesophageal reflux disease)    takes Nexium bid, hx erosive esophagitis  . Headache(784.0)    occasionally;r/t sinus   . History of colon polyps   . HTN (hypertension)   . Hx of amputation of lesser toe ([place])    sees podiatrist  . Hyperlipemia   . IBS (irritable bowel syndrome)   . Insomnia    takes Elavil nightly  . Joint pain   . Neuropathy   . Osteomyelitis ([place])   .  Pneumonia   . PONV (postoperative nausea and vomiting)   . Seasonal allergies    takes Zyrtec daily  . Sinus tachycardia     Past Surgical History:  Procedure Laterality Date  . AMPUTATION  12/28/2011   Procedure: AMPUTATION DIGIT;  Surgeon: Newt Minion, MD;  Location: [place];  Service: Orthopedics;  Laterality: Right;  Right Foot 2nd Toe Amputation at MTP (metatarsophalangeal joint)  . AMPUTATION Right 04/25/2012   Procedure: Right Foot 3rd Toe Amputation;  Surgeon: Newt Minion, MD;  Location: [place];  Service: Orthopedics;  Laterality: Right;  Right Foot Third Toe Amputation   . AMPUTATION Right 07/27/2012   Procedure: Right 4th Toe Amputation at Metatarsophalangeal;  Surgeon: Newt Minion, MD;  Location: [facility];  Service: Orthopedics;  Laterality: Right;  Right 4th Toe Amputation at Metatarsophalangeal  . AMPUTATION Left 07/15/2016   Procedure: Left 2nd Ray Amputation;  Surgeon: Newt Minion, MD;  Location: [place] [facility];  Service: Orthopedics;  Laterality: Left;  . AMPUTATION Left 11/18/2016   Procedure: Left 3rd and 4th Ray Amputation;  Surgeon: Newt Minion, MD;  Location: [place];  Service: Orthopedics;  Laterality: Left;  . AMPUTATION Right 03/29/2017   Procedure: RIGHT FOOT 3RD AND 4TH RAY AMPUTATION;  Surgeon: Newt Minion, MD;  Location: [place];  Service: Orthopedics;  Laterality: Right;  . COLONOSCOPY    .  LAPAROSCOPIC APPENDECTOMY  01/05/2011   Procedure: APPENDECTOMY LAPAROSCOPIC;  Surgeon: Pedro Earls, MD;  Location: [facility];  Service: General;  Laterality: N/A;  . OOPHORECTOMY  2001  . ROTATOR CUFF REPAIR Right    x 2  . TUBAL LIGATION    . VAGINAL HYSTERECTOMY  2001     reports that she has never smoked. She has never used smokeless tobacco. She reports that she does not drink alcohol and does not use drugs.  Allergies  Allergen Reactions  . Codeine Other (See Comments)    Makes her crazy  . Propoxyphene Hcl Itching    *DARVOCET     Family History  Problem  Relation Age of Onset  . Lung cancer Mother 46       smoked heavily  . Emphysema Mother   . Hypertension Father   . Hyperlipidemia Father   . Diabetes Father   . Coronary artery disease Father   . Dementia Father   . COPD Father        smoked  . Stomach cancer Paternal Aunt   . Brain cancer Paternal Uncle   . Irritable bowel syndrome Other        Several family members on fathers side   . Diabetes Other   . Stomach cancer Maternal Aunt   . Lung cancer Paternal Uncle   . Heart disease Paternal Uncle   . Colon cancer Neg Hx      Prior to Admission medications   Medication Sig Start Date End Date Taking? Authorizing Provider  azelastine (ASTELIN) 0.1 % nasal spray Place 2 sprays into both nostrils 2 (two) times daily. Patient taking differently: Place 2 sprays into both nostrils as needed for allergies. 11/21/19  Yes Vivi Barrack, MD  betamethasone dipropionate (DIPROLENE) 0.05 % cream Apply 1 application topically 2 (two) times daily as needed (IRRITATION). 11/30/17  Yes Lucretia Kern, DO  acetaminophen (TYLENOL) 500 MG tablet Take 500 mg by mouth every 8 (eight) hours as needed for mild pain.     [provider]  Blood Glucose Monitoring Suppl (ONE TOUCH ULTRA 2) w/Device KIT Use to check blood sugar Patient taking differently: 1 each by Other route daily. 10/20/17   Philemon Kingdom, MD  cetirizine (ZYRTEC) 10 MG tablet Take 1 tablet by mouth once daily Patient taking differently: Take 10 mg by mouth daily. 04/14/20   Vivi Barrack, MD  cholecalciferol (VITAMIN D) 1000 units tablet Take 1 tablet by mouth daily.     [provider]  dapagliflozin propanediol (FARXIGA) 5 MG TABS tablet Take 1 tablet (5 mg total) by mouth daily before breakfast. 03/31/20   Philemon Kingdom, MD  dicyclomine (BENTYL) 10 MG capsule Take 1 capsule (10 mg total) by mouth 3 (three) times daily as needed for spasms. 09/25/19   Ladene Artist, MD  Fluocinolone Acetonide 0.01 % OIL Use  1-2 drops daily as needed. Patient taking differently: Place 1-2 drops into both eyes daily as needed (eye irritation). 05/23/19   Vivi Barrack, MD  gabapentin (NEURONTIN) 100 MG capsule TAKE 2 TO 3 CAPSULES AT BEDTIME Patient taking differently: Take 200-300 mg by mouth at bedtime. 01/14/20   Philemon Kingdom, MD  glucagon (GLUCAGEN) 1 MG SOLR injection Inject 1 mg into the muscle once as needed for up to 1 dose for low blood sugar. 07/10/18   Philemon Kingdom, MD  insulin NPH Human (NOVOLIN N RELION) 100 UNIT/ML injection Inject under skin 15 units in am and  10 units at bedtime Patient taking differently: Inject under skin 12-15 units in am  and 10-15 units at bedtime 12/25/18   Philemon Kingdom, MD  insulin regular (NOVOLIN R RELION) 100 units/mL injection INJECT 12 TO 30 UNITS SUBCUTANEOUSLY THREE TIMES DAILY BEFORE MEAL(S) Patient taking differently: Inject 12-30 Units into the skin 3 (three) times daily before meals. 09/17/19   Philemon Kingdom, MD  Insulin Syringe-Needle U-100 (RELION INSULIN SYRINGE 1ML/31G) 31G X 5/16" 1 ML MISC USE 2 TIMES A DAY Patient taking differently: 1 each by Other route 2 (two) times daily. 10/10/17   Philemon Kingdom, MD  irbesartan-hydrochlorothiazide (AVALIDE) 150-12.5 MG tablet Take 1 tablet by mouth daily. 01/14/20   Vivi Barrack, MD  metFORMIN (GLUCOPHAGE) 1000 MG tablet Take 1 tablet (1,000 mg total) by mouth 2 (two) times daily with a meal. 01/14/20   Philemon Kingdom, MD  metoprolol succinate (TOPROL-XL) 25 MG 24 hr tablet Take 1 tablet (25 mg total) by mouth daily. 01/16/20   Fay Records, MD  mupirocin ointment (BACTROBAN) 2 % Apply topically daily. 12/30/19   [provider]  omeprazole (PRILOSEC) 40 MG capsule Take 1 capsule (40 mg total) by mouth 2 (two) times daily. 01/14/20   Ladene Artist, MD  ONETOUCH ULTRA test strip USE 1 STRIP TO CHECK GLUCOSE THREE TIMES DAILY Patient taking differently: 1 each by Other route in the morning, at noon,  and at bedtime. 05/17/19   Philemon Kingdom, MD  OVER THE COUNTER MEDICATION Apply 1-3 application topically at bedtime. TOPRICIN FOOT CREAM - NEUROPATHY FOOT CREAM **APPLIES TO BOTH FEET AT BEDTIME**    [provider]  Probiotic Product (PROBIOTIC DAILY PO) Take 1 capsule by mouth daily.    [provider]  promethazine-dextromethorphan (PROMETHAZINE-DM) 6.25-15 MG/5ML syrup Take 2.5 mLs by mouth 4 (four) times daily as needed for cough. 11/21/19   Vivi Barrack, MD  rosuvastatin (CRESTOR) 20 MG tablet Take 1 tablet (20 mg total) by mouth daily. 01/16/20   Fay Records, MD  silver sulfADIAZINE (SILVADENE) 1 % cream Apply 1 application topically daily. 05/07/19   Persons, Bevely Palmer, PA  vitamin B-12 (CYANOCOBALAMIN) 1000 MCG tablet Take 1,000 mcg daily by mouth.    [provider]    Physical Exam: Vitals:   05/29/20 1140 05/29/20 1310 05/29/20 1417 05/29/20 1432  BP:  116/63 107/69 119/62  Pulse:  85 86 86  Resp:  16 (!) 22 19  Temp:      SpO2:  97% 99% 99%  Weight: 104.3 kg     Height: 5' 5.5" (1.664 m)       Constitutional: NAD, calm, comfortable Vitals:   05/29/20 1140 05/29/20 1310 05/29/20 1417 05/29/20 1432  BP:  116/63 107/69 119/62  Pulse:  85 86 86  Resp:  16 (!) 22 19  Temp:      SpO2:  97% 99% 99%  Weight: 104.3 kg     Height: 5' 5.5" (1.664 m)      Eyes: PERRL, lids and conjunctivae normal ENMT: Mucous membranes are moist. Posterior pharynx clear of any exudate or lesions.Normal dentition.  Neck: normal, supple, no masses, no thyromegaly Respiratory: clear to auscultation bilaterally, no wheezing, no crackles. Normal respiratory effort. No accessory muscle use.  Cardiovascular: Regular rate and rhythm, no murmurs / rubs / gallops. No extremity edema. 2+ pedal pulses. No carotid bruits.  Abdomen: no tenderness, no masses palpated. No hepatosplenomegaly. Bowel sounds positive.  Musculoskeletal: no clubbing / cyanosis. No  joint deformity  upper and lower extremities. Good ROM, no contractures. Normal muscle tone.  Skin: Small ulcer on left mid foot with purulent discharge and irregular border, rash to the mid level left shin area, 2-3 small ulcers on skin fold between fourth and fifth toes on the left. Neurologic: CN 2-12 grossly intact. Sensation intact, DTR normal. Strength 5/5 in all 4.  Psychiatric: Normal judgment and insight. Alert and oriented x 3. Normal mood.     Labs on Admission: I have personally reviewed following labs and imaging studies  CBC: Recent Labs  Lab 05/29/20 1149  WBC 13.9*  NEUTROABS 10.9*  HGB 11.2*  HCT 35.9*  MCV 86.5  PLT 454   Basic Metabolic Panel: Recent Labs  Lab 05/29/20 1149  NA 135  K 3.4*  CL 97*  CO2 28  GLUCOSE 350*  BUN 15  CREATININE 1.31*  CALCIUM 9.2   GFR: Estimated Creatinine Clearance: 46.9 mL/min (A) (by C-G formula based on SCr of 1.31 mg/dL (H)). Liver Function Tests: Recent Labs  Lab 05/29/20 1149  AST 13*  ALT 10  ALKPHOS 48  BILITOT 0.8  PROT 7.4  ALBUMIN 3.4*   No results for input(s): LIPASE, AMYLASE in the last 168 hours. No results for input(s): AMMONIA in the last 168 hours. Coagulation Profile: Recent Labs  Lab 05/29/20 1149  INR 1.2   Cardiac Enzymes: No results for input(s): CKTOTAL, CKMB, CKMBINDEX, TROPONINI in the last 168 hours. BNP (last 3 results) No results for input(s): PROBNP in the last 8760 hours. HbA1C: No results for input(s): HGBA1C in the last 72 hours. CBG: No results for input(s): GLUCAP in the last 168 hours. Lipid Profile: No results for input(s): CHOL, HDL, LDLCALC, TRIG, CHOLHDL, LDLDIRECT in the last 72 hours. Thyroid Function Tests: No results for input(s): TSH, T4TOTAL, FREET4, T3FREE, THYROIDAB in the last 72 hours. Anemia Panel: No results for input(s): VITAMINB12, FOLATE, FERRITIN, TIBC, IRON, RETICCTPCT in the last 72 hours. Urine analysis:    Component Value Date/Time   COLORURINE YELLOW  05/22/2018 0804   APPEARANCEUR CLEAR 05/22/2018 0804   LABSPEC 1.017 05/22/2018 0804   PHURINE 5.0 05/22/2018 0804   GLUCOSEU NEGATIVE 05/22/2018 0804   GLUCOSEU >=1000 12/30/2010 1539   HGBUR SMALL (A) 05/22/2018 0804   HGBUR trace-lysed 10/14/2008 1056   BILIRUBINUR Positive 05/29/2019 1309   KETONESUR NEGATIVE 05/22/2018 0804   PROTEINUR Positive (A) 05/29/2019 1309   PROTEINUR NEGATIVE 05/22/2018 0804   UROBILINOGEN 1.0 05/29/2019 1309   UROBILINOGEN 0.2 12/30/2010 1539   NITRITE Negative 05/29/2019 1309   NITRITE NEGATIVE 05/22/2018 0804   LEUKOCYTESUR Negative 05/29/2019 1309   LEUKOCYTESUR TRACE (A) 05/22/2018 0804    Radiological Exams on Admission: No results found.  EKG: Independently reviewed. Sinus, no acute ST-T changes.  Assessment/Plan Active Problems:   Osteomyelitis ([place])  (please populate well all problems here in Problem List. (For example, if patient is on BP meds at home and you resume or decide to hold them, it is a problem that needs to be her. Same for CAD, COPD, HLD and so on)  Left foot DM ulcer with cellulitis -Suspect underlying osteomyelitis -MRI ordered. Orth/Dr. Sharol Given would like to be updated about the MRI result. -Vanco plus Zosyn -ABI to screen PVD  Lactic acid elevation -Doubt sepsis -Suspect that the elevation of lactic acid level is from infection plus Metformin, will D/C Metformin  HTN -Controlled, continue home BP regimen  DM neuropathy -Continue Gabapentin  DVT prophylaxis: Lovenox Code Status:  Full code Family Communication: None at bedside Disposition Plan: Expect more than 2 midnight hospital stay, likely will need OR Consults called: Ortho Dr. Sharol Given Admission status: MedSurg   Lequita Halt MD [facility] Pager [phone]  05/29/2020, 2:58 PM

## 2020-05-29 NOTE — Progress Notes (Addendum)
Pharmacy Antibiotic Note  Beth Holden is a 75 y.o. female admitted on 05/29/2020 presenting with worsening ulcer with ascending cellulitis to extremity, concern for osteo on plain film.  LA 2.3.  Pharmacy has been consulted for vancomycin dosing.  Zosyn per MD  Plan: Vancomycin 2000 mg IV x 1, then 1250 mg IV q 36h (eAUC 479, Goal AUC 400-550, SCr 1.31) Monitor renal function, Cx/MRI to narrow Vancomycin levels as needed  Addendum: Consult for zosyn Zosyn 3.375g IV every 8 hours (extended infusion)   Height: 5' 5.5" (166.4 cm) Weight: 104.3 kg (230 lb) IBW/kg (Calculated) : 58.15  Temp (24hrs), Avg:98.4 F (36.9 C), Min:98.4 F (36.9 C), Max:98.4 F (36.9 C)  Recent Labs  Lab 05/29/20 1149  WBC 13.9*  CREATININE 1.31*  LATICACIDVEN 2.3*    Estimated Creatinine Clearance: 46.9 mL/min (A) (by C-G formula based on SCr of 1.31 mg/dL (H)).    Allergies  Allergen Reactions  . Codeine Other (See Comments)    Makes her crazy  . Propoxyphene Hcl Itching    *DARVOCET     Bertis Ruddy, PharmD Clinical Pharmacist ED Pharmacist Phone # [phone] 05/29/2020 2:15 PM

## 2020-05-29 NOTE — ED Triage Notes (Signed)
Pt sent here by Dr Jess Barters office for possible L foot infection. X-ray was taken at office today.

## 2020-05-29 NOTE — Progress Notes (Signed)
Patient received to the unit. Patient is alert and oriented x4. Vital signs are stable. Iv in place. Skin assessment done with another nurse. Given instructions about call bell and phone. Bed in low position and call bell in reach.

## 2020-05-29 NOTE — ED Notes (Signed)
PA Mickel Baas notified of elevated lactic acid.

## 2020-05-29 NOTE — ED Provider Notes (Signed)
[facility] EMERGENCY DEPARTMENT Provider Note   CSN: [id number] Arrival date & time: 05/29/20  1129     History Chief Complaint  Patient presents with  . Recurrent Skin Infections    Beth Holden is a 75 y.o. female.  Past medical history of diabetes, Charcot foot bilaterally, hypertension, hyperlipidemia presenting to ER with concern for worsening foot ulcer, left leg redness.  Patient states over the past day or 2 she is noted increased redness and some swelling in her leg.  No significant pain, no fever.  Patient went to her orthopedic office this morning and they were concerned about possible osteomyelitis and cellulitis and recommended patient go to ER for admission for IV antibiotics and MRI.  Patient denies any other acute complaints at present. HPI     Past Medical History:  Diagnosis Date  . Alopecia   . Anemia, mild   . Arthritis   . Chronic cough    sees pulmonologist  . Chronic pain    chest wall and abd - s/p extensive eval  . Diabetes mellitus with neuropathy ([place])    sees endocrine  . Diverticulosis   . Fatty liver   . GERD (gastroesophageal reflux disease)    takes Nexium bid, hx erosive esophagitis  . Headache(784.0)    occasionally;r/t sinus   . History of colon polyps   . HTN (hypertension)   . Hx of amputation of lesser toe ([place])    sees podiatrist  . Hyperlipemia   . IBS (irritable bowel syndrome)   . Insomnia    takes Elavil nightly  . Joint pain   . Neuropathy   . Osteomyelitis ([place])   . Pneumonia   . PONV (postoperative nausea and vomiting)   . Seasonal allergies    takes Zyrtec daily  . Sinus tachycardia     Patient Active Problem List   Diagnosis Date Noted  . Memory loss 11/21/2019  . Mild nonproliferative diabetic retinopathy of both eyes ([place]) 10/23/2019  . Nuclear sclerotic cataract of both eyes 10/23/2019  . Posterior vitreous detachment of right eye 10/23/2019  . Retinal hemorrhage of left eye 10/23/2019   . Hyperlipidemia associated with type 2 diabetes mellitus ([place]) 11/30/2017  . Impingement syndrome of right shoulder 07/06/2017  . Iron deficiency anemia 05/11/2017  . Morbid obesity ([place]) 04/24/2017  . Osteomyelitis ([place])   . Anemia 02/24/2017  . Baker's cyst 07/10/2016  . Onychomycosis 12/07/2015  . Upper airway cough syndrome 03/05/2014  . History of amputation of lesser toe of right foot ([place]) 04/15/2013  . Hypertension associated with diabetes ([place]) 12/05/2006  . Uncontrolled type 2 diabetes mellitus with peripheral circulatory disorder ([place]) 10/12/2006  . Dyslipidemia associated with type 2 diabetes mellitus ([place]) 10/12/2006  . Allergic rhinitis 10/12/2006  . GERD - Followed by Dr. Fuller Plan 10/12/2006  . IRRITABLE BOWEL SYNDROME - followed by Dr. Fuller Plan 10/12/2006  . Peripheral neuropathy 10/11/2006  . ALOPECIA NEC 10/11/2006    Past Surgical History:  Procedure Laterality Date  . AMPUTATION  12/28/2011   Procedure: AMPUTATION DIGIT;  Surgeon: Newt Minion, MD;  Location: [facility];  Service: Orthopedics;  Laterality: Right;  Right Foot 2nd Toe Amputation at MTP (metatarsophalangeal joint)  . AMPUTATION Right 04/25/2012   Procedure: Right Foot 3rd Toe Amputation;  Surgeon: Newt Minion, MD;  Location: [facility];  Service: Orthopedics;  Laterality: Right;  Right Foot Third Toe Amputation   . AMPUTATION Right 07/27/2012   Procedure: Right 4th Toe Amputation at  Metatarsophalangeal;  Surgeon: Newt Minion, MD;  Location: [facility];  Service: Orthopedics;  Laterality: Right;  Right 4th Toe Amputation at Metatarsophalangeal  . AMPUTATION Left 07/15/2016   Procedure: Left 2nd Ray Amputation;  Surgeon: Newt Minion, MD;  Location: [facility];  Service: Orthopedics;  Laterality: Left;  . AMPUTATION Left 11/18/2016   Procedure: Left 3rd and 4th Ray Amputation;  Surgeon: Newt Minion, MD;  Location: [facility];  Service: Orthopedics;  Laterality: Left;  . AMPUTATION Right 03/29/2017   Procedure: RIGHT FOOT 3RD  AND 4TH RAY AMPUTATION;  Surgeon: Newt Minion, MD;  Location: [facility];  Service: Orthopedics;  Laterality: Right;  . COLONOSCOPY    . LAPAROSCOPIC APPENDECTOMY  01/05/2011   Procedure: APPENDECTOMY LAPAROSCOPIC;  Surgeon: Pedro Earls, MD;  Location: [facility];  Service: General;  Laterality: N/A;  . OOPHORECTOMY  2001  . ROTATOR CUFF REPAIR Right    x 2  . TUBAL LIGATION    . VAGINAL HYSTERECTOMY  2001     OB History   No obstetric history on file.     Family History  Problem Relation Age of Onset  . Lung cancer Mother 31       smoked heavily  . Emphysema Mother   . Hypertension Father   . Hyperlipidemia Father   . Diabetes Father   . Coronary artery disease Father   . Dementia Father   . COPD Father        smoked  . Stomach cancer Paternal Aunt   . Brain cancer Paternal Uncle   . Irritable bowel syndrome Other        Several family members on fathers side   . Diabetes Other   . Stomach cancer Maternal Aunt   . Lung cancer Paternal Uncle   . Heart disease Paternal Uncle   . Colon cancer Neg Hx     Social History   Tobacco Use  . Smoking status: Never Smoker  . Smokeless tobacco: Never Used  Vaping Use  . Vaping Use: Never used  Substance Use Topics  . Alcohol use: No  . Drug use: No    Home Medications Prior to Admission medications   Medication Sig Start Date End Date Taking? Authorizing Provider  acetaminophen (TYLENOL) 500 MG tablet Take 500 mg by mouth every 8 (eight) hours as needed for mild pain.    Yes [provider]  azelastine (ASTELIN) 0.1 % nasal spray Place 2 sprays into both nostrils 2 (two) times daily. Patient taking differently: Place 2 sprays into both nostrils as needed for allergies. 11/21/19  Yes Vivi Barrack, MD  betamethasone dipropionate (DIPROLENE) 0.05 % cream Apply 1 application topically 2 (two) times daily as needed (IRRITATION). 11/30/17  Yes Colin Benton R, DO  Blood Glucose Monitoring Suppl (ONE TOUCH ULTRA 2)  w/Device KIT Use to check blood sugar Patient taking differently: 1 each by Other route daily. 10/20/17  Yes Philemon Kingdom, MD  cetirizine (ZYRTEC) 10 MG tablet Take 1 tablet by mouth once daily Patient taking differently: Take 10 mg by mouth daily. 04/14/20  Yes Vivi Barrack, MD  cholecalciferol (VITAMIN D) 1000 units tablet Take 1 tablet by mouth daily.    Yes [provider]  dicyclomine (BENTYL) 10 MG capsule Take 1 capsule (10 mg total) by mouth 3 (three) times daily as needed for spasms. 09/25/19  Yes Ladene Artist, MD  Fluocinolone Acetonide 0.01 % OIL Use 1-2 drops daily as needed. Patient  taking differently: Place 1-2 drops into both eyes daily as needed (eye irritation). 05/23/19  Yes Vivi Barrack, MD  gabapentin (NEURONTIN) 100 MG capsule TAKE 2 TO 3 CAPSULES AT BEDTIME Patient taking differently: Take 300 mg by mouth at bedtime. 01/14/20  Yes Philemon Kingdom, MD  glucagon (GLUCAGEN) 1 MG SOLR injection Inject 1 mg into the muscle once as needed for up to 1 dose for low blood sugar. 07/10/18  Yes Philemon Kingdom, MD  insulin NPH Human (NOVOLIN N RELION) 100 UNIT/ML injection Inject under skin 15 units in am and 10 units at bedtime Patient taking differently: Inject 15-20 Units into the skin See admin instructions. Inject under skin 12-15 units in am  and 10-15 units at bedtime per sliding scale 12/25/18  Yes Philemon Kingdom, MD  insulin regular (NOVOLIN R RELION) 100 units/mL injection INJECT 12 TO 30 UNITS SUBCUTANEOUSLY THREE TIMES DAILY BEFORE MEAL(S) Patient taking differently: Inject 20-30 Units into the skin 3 (three) times daily before meals. Per sliding scale 09/17/19  Yes Philemon Kingdom, MD  Insulin Syringe-Needle U-100 (RELION INSULIN SYRINGE 1ML/31G) 31G X 5/16" 1 ML MISC USE 2 TIMES A DAY Patient taking differently: 1 each by Other route 2 (two) times daily. 10/10/17  Yes Philemon Kingdom, MD  irbesartan-hydrochlorothiazide (AVALIDE) 150-12.5 MG tablet  Take 1 tablet by mouth daily. 01/14/20  Yes Vivi Barrack, MD  metFORMIN (GLUCOPHAGE) 1000 MG tablet Take 1 tablet (1,000 mg total) by mouth 2 (two) times daily with a meal. 01/14/20  Yes Philemon Kingdom, MD  metoprolol succinate (TOPROL-XL) 25 MG 24 hr tablet Take 1 tablet (25 mg total) by mouth daily. 01/16/20  Yes Fay Records, MD  mupirocin ointment (BACTROBAN) 2 % Apply 1 application topically daily. 12/30/19  Yes [provider]  omeprazole (PRILOSEC) 40 MG capsule Take 1 capsule (40 mg total) by mouth 2 (two) times daily. 01/14/20  Yes Ladene Artist, MD  ONETOUCH ULTRA test strip USE 1 STRIP TO CHECK GLUCOSE THREE TIMES DAILY Patient taking differently: 1 each by Other route in the morning, at noon, and at bedtime. 05/17/19  Yes Philemon Kingdom, MD  OVER THE COUNTER MEDICATION Apply 1-3 application topically at bedtime. TOPRICIN FOOT CREAM - NEUROPATHY FOOT CREAM **APPLIES TO BOTH FEET AT BEDTIME**   Yes [provider]  Probiotic Product (PROBIOTIC DAILY PO) Take 1 capsule by mouth daily.   Yes [provider]  promethazine-dextromethorphan (PROMETHAZINE-DM) 6.25-15 MG/5ML syrup Take 2.5 mLs by mouth 4 (four) times daily as needed for cough. 11/21/19  Yes Vivi Barrack, MD  rosuvastatin (CRESTOR) 20 MG tablet Take 1 tablet (20 mg total) by mouth daily. 01/16/20  Yes Fay Records, MD  silver sulfADIAZINE (SILVADENE) 1 % cream Apply 1 application topically daily. 05/07/19  Yes Persons, Bevely Palmer, PA  vitamin B-12 (CYANOCOBALAMIN) 1000 MCG tablet Take 1,000 mcg daily by mouth.   Yes [provider]  dapagliflozin propanediol (FARXIGA) 5 MG TABS tablet Take 1 tablet (5 mg total) by mouth daily before breakfast. 03/31/20   Philemon Kingdom, MD    Allergies    Codeine and Propoxyphene hcl  Review of Systems   Review of Systems  Constitutional: Negative for chills and fever.  HENT: Negative for ear pain and sore throat.   Eyes: Negative for pain and visual  disturbance.  Respiratory: Negative for cough and shortness of breath.   Cardiovascular: Negative for chest pain and palpitations.  Gastrointestinal: Negative for abdominal pain and vomiting.  Genitourinary:  Negative for dysuria and hematuria.  Musculoskeletal: Negative for arthralgias and back pain.  Skin: Positive for rash and wound. Negative for color change.  Neurological: Negative for seizures and syncope.  All other systems reviewed and are negative.   Physical Exam Updated Vital Signs BP 133/61 (BP Location: Left Arm)   Pulse 82   Temp 98.7 F (37.1 C) (Oral)   Resp 20   Ht '5\' 6"'  (1.676 m)   Wt 106.9 kg   SpO2 96%   BMI 38.04 kg/m   Physical Exam Vitals and nursing note reviewed.  Constitutional:      General: She is not in acute distress.    Appearance: She is well-developed.  HENT:     Head: Normocephalic and atraumatic.  Eyes:     Conjunctiva/sclera: Conjunctivae normal.  Cardiovascular:     Rate and Rhythm: Normal rate and regular rhythm.     Heart sounds: No murmur heard.   Pulmonary:     Effort: Pulmonary effort is normal. No respiratory distress.     Breath sounds: Normal breath sounds.  Abdominal:     Palpations: Abdomen is soft.     Tenderness: There is no abdominal tenderness.  Musculoskeletal:     Cervical back: Neck supple.     Comments: LLE: erythema and swelling up to her the mid shin.  There is a warmth as well.  Ulcer noted over the base of the first metatarsal there is plantar ulceration and blistering.    Skin:    General: Skin is warm and dry.  Neurological:     Mental Status: She is alert.     ED Results / Procedures / Treatments   Labs (all labs ordered are listed, but only abnormal results are displayed) Labs Reviewed  COMPREHENSIVE METABOLIC PANEL - Abnormal; Notable for the following components:      Result Value   Potassium 3.4 (*)    Chloride 97 (*)    Glucose, Bld 350 (*)    Creatinine, Ser 1.31 (*)    Albumin 3.4 (*)     AST 13 (*)    GFR, Estimated 43 (*)    All other components within normal limits  LACTIC ACID, PLASMA - Abnormal; Notable for the following components:   Lactic Acid, Venous 2.3 (*)    All other components within normal limits  LACTIC ACID, PLASMA - Abnormal; Notable for the following components:   Lactic Acid, Venous 2.2 (*)    All other components within normal limits  CBC WITH DIFFERENTIAL/PLATELET - Abnormal; Notable for the following components:   WBC 13.9 (*)    Hemoglobin 11.2 (*)    HCT 35.9 (*)    Neutro Abs 10.9 (*)    All other components within normal limits  HEMOGLOBIN A1C - Abnormal; Notable for the following components:   Hgb A1c MFr Bld 8.4 (*)    All other components within normal limits  BASIC METABOLIC PANEL - Abnormal; Notable for the following components:   Glucose, Bld 207 (*)    Creatinine, Ser 1.15 (*)    GFR, Estimated 51 (*)    All other components within normal limits  CBC - Abnormal; Notable for the following components:   WBC 12.4 (*)    Hemoglobin 10.7 (*)    HCT 34.0 (*)    All other components within normal limits  GLUCOSE, CAPILLARY - Abnormal; Notable for the following components:   Glucose-Capillary 126 (*)    All other components within normal limits  GLUCOSE, CAPILLARY - Abnormal; Notable for the following components:   Glucose-Capillary 256 (*)    All other components within normal limits  GLUCOSE, CAPILLARY - Abnormal; Notable for the following components:   Glucose-Capillary 310 (*)    All other components within normal limits  SARS CORONAVIRUS 2 (TAT 6-24 HRS)  CULTURE, BLOOD (ROUTINE X 2)  CULTURE, BLOOD (ROUTINE X 2)  PROTIME-INR  GLUCOSE, CAPILLARY  URINALYSIS, ROUTINE W REFLEX MICROSCOPIC    EKG None  Radiology MR FOOT LEFT W WO CONTRAST  Result Date: 05/30/2020 CLINICAL DATA:  Chronic diabetic foot ulcer and Charcot arthropathy EXAM: MRI OF THE LEFT FOOT WITHOUT AND WITH CONTRAST TECHNIQUE: Multiplanar, multisequence  MR imaging of the left hindfoot was performed before and after the administration of intravenous contrast. CONTRAST:  7.2m GADAVIST GADOBUTROL 1 MMOL/ML IV SOLN COMPARISON:  X-ray 05/29/2020 FINDINGS: TENDONS Peroneal: Intact peroneus longus and peroneus brevis tendons. No tenosynovitis. Posteromedial: Intact tibialis posterior, flexor hallucis longus and flexor digitorum longus tendons. No significant tenosynovial fluid. Anterior: Intact tibialis anterior, extensor hallucis longus and extensor digitorum longus tendons. No tenosynovitis. Achilles: Intact. Plantar Fascia: Intact. LIGAMENTS Lateral: The anterior and posterior tibiofibular ligaments are intact. The anterior and posterior talofibular ligaments are intact. Intact calcaneofibular ligament. Medial: Deltoid ligament and spring ligament complex intact. CARTILAGE Ankle Joint: No joint effusion or chondral defect. Subtalar Joints/Sinus Tarsi: No joint effusion or chondral defect. Preservation of the anatomic fat within the sinus tarsi. Bones: Chronic midfoot arthropathy with advanced joint space loss, subchondral cyst formation, and marginal osteophytosis. Findings most severely involves the tarsometatarsal joints and naviculocuneiform joints. No evidence of fracture or dislocation. No evidence of cortical destruction or acute osteomyelitis. No bone marrow edema. No joint effusions. Other: Mild subcutaneous edema, most pronounced over the medial ankle and plantar foot. No deep soft tissue ulceration. No organized or drainable fluid collection. Chronic denervation changes of the intrinsic foot musculature. IMPRESSION: IMPRESSION 1. 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 No evidence of acute osteomyelitis. 2. Mild subcutaneous edema, most pronounced over the medial ankle and plantar foot. No organized or drainable fluid collection. Findings are in agreement with the preliminary report provided by Dr. SJoelyn Oms Electronically  Signed   By: NDavina PokeD.O.   On: 05/30/2020 11:14   VAS [place]ABI WITH/WO TBI  Result Date: 05/30/2020  LOWER EXTREMITY DOPPLER STUDY Patient Name:  Beth Holden Date of Exam:   05/30/2020 Medical Rec #: [id number]      Accession #:    [id number] of Birth: 907/06/1945      Patient Gender: F Patient Age:   74Y [facility]      [facility] [place] WITH/WO TBI Referring Phys: [id number] Halt--------------------------------------------------------------------------------  Indications: Ulceration. High Risk Factors: Hypertension, hyperlipidemia, Diabetes. Other Factors: Bilateral 2nd, 3rd, and 4th toe amputations.  Comparison Study: Prior study done 11/10/2010 Performing Technologist: KSharion DoveRVS  Examination Guidelines: A complete evaluation includes at minimum, Doppler waveform signals and systolic blood pressure reading at the level of bilateral brachial, anterior tibial, and posterior tibial arteries, when vessel segments are accessible. Bilateral testing is considered an integral part of a complete examination. Photoelectric Plethysmograph (PPG) waveforms and toe systolic pressure readings are included as required and additional duplex testing as needed. Limited examinations for reoccurring indications may be performed as noted.  ABI Findings: +---------+------------------+-----+-----------+--------+ Right    Rt Pressure (mmHg)IndexWaveform   Comment  +---------+------------------+-----+-----------+--------+ Brachial 145  multiphasic         +---------+------------------+-----+-----------+--------+ PTA      152               1.05 multiphasic         +---------+------------------+-----+-----------+--------+ DP       157               1.08 multiphasic         +---------+------------------+-----+-----------+--------+ Great Toe139               0.96                      +---------+------------------+-----+-----------+--------+ +---------+------------------+-----+-----------+-------+ Left     Lt Pressure (mmHg)IndexWaveform   Comment +---------+------------------+-----+-----------+-------+ Brachial 140                    multiphasic        +---------+------------------+-----+-----------+-------+ PTA      150               1.03 multiphasic        +---------+------------------+-----+-----------+-------+ DP       156               1.08 multiphasic        +---------+------------------+-----+-----------+-------+ Great Toe97                0.67                    +---------+------------------+-----+-----------+-------+ +-------+-----------+-----------+------------+------------+ ABI/TBIToday's ABIToday's TBIPrevious ABIPrevious TBI +-------+-----------+-----------+------------+------------+ Right  1.08       0.96       1.2         0.57         +-------+-----------+-----------+------------+------------+ Left   1.08       0.67       1.2         0.81         +-------+-----------+-----------+------------+------------+  Bilateral ABIs appear essentially unchanged compared to prior study on 11/10/2010. Left TBI appears decreased compared to prior study on 11/10/2010. Right TBI appears increased compared to prior study on 11/10/2010.  Summary: Right: Resting right ankle-brachial index is within normal range. No evidence of significant right lower extremity arterial disease. The right toe-brachial index is normal. Left: Resting left ankle-brachial index is within normal range. No evidence of significant left lower extremity arterial disease. The left toe-brachial index is normal.  *See table(s) above for measurements and observations.     Preliminary     Procedures Procedures   Medications Ordered in ED Medications  vancomycin (VANCOREADY) IVPB 1250 mg/250 mL (has no administration in time range)  insulin aspart (novoLOG) injection 0-15  Units (11 Units Subcutaneous Given 05/30/20 1119)  enoxaparin (LOVENOX) injection 50 mg (50 mg Subcutaneous Given 05/29/20 1730)  acetaminophen (TYLENOL) tablet 650 mg (650 mg Oral Given 05/30/20 0634)    Or  acetaminophen (TYLENOL) suppository 650 mg ( Rectal See Alternative 05/30/20 0634)  HYDROmorphone (DILAUDID) injection 0.5-1 mg (has no administration in time range)  ondansetron (ZOFRAN) tablet 4 mg (has no administration in time range)    Or  ondansetron (ZOFRAN) injection 4 mg (has no administration in time range)  dapagliflozin propanediol (FARXIGA) tablet 5 mg (5 mg Oral Patient Refused/Not Given 05/30/20 0814)  metoprolol succinate (TOPROL-XL) 24 hr tablet 25 mg (25 mg Oral Given 05/30/20 0816)  rosuvastatin (CRESTOR) tablet 20 mg (20 mg Oral Given 05/30/20 0816)  hydrALAZINE (APRESOLINE) tablet 25 mg (has  no administration in time range)  dicyclomine (BENTYL) capsule 10 mg (has no administration in time range)  pantoprazole (PROTONIX) EC tablet 40 mg (40 mg Oral Given 05/30/20 0817)  acidophilus (RISAQUAD) capsule 1 capsule (1 capsule Oral Given 05/30/20 0814)  vitamin B-12 (CYANOCOBALAMIN) tablet 1,000 mcg (1,000 mcg Oral Given 05/30/20 0816)  azelastine (ASTELIN) 0.1 % nasal spray 2 spray (has no administration in time range)  loratadine (CLARITIN) tablet 10 mg (10 mg Oral Given 05/30/20 0817)  guaiFENesin-dextromethorphan (ROBITUSSIN DM) 100-10 MG/5ML syrup 5 mL (has no administration in time range)  triamcinolone cream (KENALOG) 0.5 % ( Topical Given 05/30/20 0814)  cholecalciferol (VITAMIN D3) tablet 1,000 Units (1,000 Units Oral Given 05/30/20 0816)  insulin glargine (LANTUS) injection 20 Units (20 Units Subcutaneous Given 05/30/20 0813)  insulin aspart (novoLOG) injection 6 Units (6 Units Subcutaneous Given 05/30/20 1120)  piperacillin-tazobactam (ZOSYN) IVPB 3.375 g (3.375 g Intravenous New Bag/Given 05/30/20 0514)  gabapentin (NEURONTIN) capsule 300 mg (has no administration in time  range)  sodium chloride 0.9 % bolus 1,000 mL (0 mLs Intravenous Stopped 05/29/20 1540)  vancomycin (VANCOREADY) IVPB 2000 mg/400 mL (0 mg Intravenous Stopped 05/29/20 1829)  potassium chloride SA (KLOR-CON) CR tablet 40 mEq (40 mEq Oral Given 05/29/20 1553)  gadobutrol (GADAVIST) 1 MMOL/ML injection 7.5 mL (7.5 mLs Intravenous Contrast Given 05/29/20 2119)    ED Course  I have reviewed the triage vital signs and the nursing notes.  Pertinent labs & imaging results that were available during my care of the patient were reviewed by me and considered in my medical decision making (see chart for details).    MDM Rules/Calculators/A&P                           75 year old lady sent from Ortho office with concern for lower leg cellulitis and possible osteomyelitis.  Patient otherwise appears well, vitals are stable, afebrile.  Discussed case with Orion Crook on-call for Ortho, agrees with IV antibiotics, MRI.  He requests Ortho be contacted if MRI shows osteo but if MRI is reassuring then medical management for the cellulitis. Started on IV abx, consulted medicine for admission.  Final Clinical Impression(s) / ED Diagnoses Final diagnoses:  Cellulitis, unspecified cellulitis site    Rx / DC Orders ED Discharge Orders    None       Lucrezia Starch, MD 05/30/20 1431

## 2020-05-29 NOTE — ED Notes (Signed)
Called x1. RN unavailable

## 2020-05-30 ENCOUNTER — Inpatient Hospital Stay (HOSPITAL_COMMUNITY): Payer: Medicare Other

## 2020-05-30 DIAGNOSIS — L0390 Cellulitis, unspecified: Secondary | ICD-10-CM

## 2020-05-30 DIAGNOSIS — E1165 Type 2 diabetes mellitus with hyperglycemia: Secondary | ICD-10-CM | POA: Diagnosis not present

## 2020-05-30 DIAGNOSIS — E1151 Type 2 diabetes mellitus with diabetic peripheral angiopathy without gangrene: Secondary | ICD-10-CM | POA: Diagnosis not present

## 2020-05-30 LAB — GLUCOSE, CAPILLARY
Glucose-Capillary: 256 mg/dL — ABNORMAL HIGH (ref 70–99)
Glucose-Capillary: 310 mg/dL — ABNORMAL HIGH (ref 70–99)
Glucose-Capillary: 165 mg/dL — ABNORMAL HIGH (ref 70–99)
Glucose-Capillary: 274 mg/dL — ABNORMAL HIGH (ref 70–99)

## 2020-05-30 LAB — BASIC METABOLIC PANEL
Anion gap: 10 (ref 5–15)
BUN: 15 mg/dL (ref 8–23)
CO2: 28 mmol/L (ref 22–32)
Calcium: 9.3 mg/dL (ref 8.9–10.3)
Chloride: 99 mmol/L (ref 98–111)
Creatinine, Ser: 1.15 mg/dL — ABNORMAL HIGH (ref 0.44–1.00)
GFR, Estimated: 51 mL/min — ABNORMAL LOW (ref >60)
Glucose, Bld: 207 mg/dL — ABNORMAL HIGH (ref 70–99)
Potassium: 3.6 mmol/L (ref 3.5–5.1)
Sodium: 137 mmol/L (ref 135–145)

## 2020-05-30 LAB — CBC
HCT: 34 % — ABNORMAL LOW (ref 36.0–46.0)
Hemoglobin: 10.7 g/dL — ABNORMAL LOW (ref 12.0–15.0)
MCH: 27.2 pg (ref 26.0–34.0)
MCHC: 31.5 g/dL (ref 30.0–36.0)
MCV: 86.3 fL (ref 80.0–100.0)
Platelets: 400 10*3/uL (ref 150–400)
RBC: 3.94 MIL/uL (ref 3.87–5.11)
RDW: 13.4 % (ref 11.5–15.5)
WBC: 12.4 10*3/uL — ABNORMAL HIGH (ref 4.0–10.5)
nRBC: 0 % (ref 0.0–0.2)

## 2020-05-30 MED ORDER — DIPHENHYDRAMINE HCL 25 MG PO CAPS: 50.0000 mg | ORAL_CAPSULE | Freq: Four times a day (QID) | ORAL | Status: DC | PRN

## 2020-05-30 MED ORDER — GABAPENTIN 300 MG PO CAPS
300.0000 mg | ORAL_CAPSULE | Freq: Every day | ORAL | Status: DC
  Administered 2020-05-30 – 2020-06-01 (×3): 300 mg via ORAL
  Filled 2020-05-30 (×3): qty 1

## 2020-05-30 NOTE — Progress Notes (Signed)
VASCULAR LAB    ABIs have been performed.  See CV proc for preliminary results.   Trany Chernick, RVT 05/30/2020, 1:21 PM

## 2020-05-30 NOTE — Progress Notes (Signed)
Progress Note    Beth Holden  IHK:[id number] DOB: 10/23/1945  DOA: 05/29/2020 PCP: Vivi Barrack, MD    Brief Narrative:     Medical records reviewed and are as summarized below:  Beth Holden is an 75 y.o. 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 Jess Barters office for evaluation of worsening of left foot ulcer.   Assessment/Plan:   Active Problems:   Uncontrolled type 2 diabetes mellitus with peripheral circulatory disorder (HCC)   Morbid obesity (HCC)   Left foot DM ulcer with cellulitis -MRI ordered: no osteo/no abscess -- per H&P updated Coverage for Dr. Sharol Given-- Dr.  Lorin Mercy who suggested continue abx and outpatient follow up with Dr. Sharol Given  -Vanco plus Zosyn -ABI normal -de-escalate abx as able -elevate extremity -mark boarder of redness  DM -SSI  HTN -Controlled, continue home BP regimen  DM neuropathy -Continue Gabapentin  obesity Body mass index is 38.04 kg/m.   Family Communication/Anticipated D/C date and plan/Code Status   DVT prophylaxis: Lovenox ordered. Code Status: Full Code.  Disposition Plan: Status is: Inpatient  Remains inpatient appropriate because:Inpatient level of care appropriate due to severity of illness   Dispo: The patient is from: Home              Anticipated d/c is to: Home              Patient currently is not medically stable to d/c.   Difficult to place patient No         Medical Consultants:    Ortho (phone)  Subjective:   Still with pain in foot and thigh  Objective:    Vitals:   05/29/20 2352 05/30/20 0418 05/30/20 0809 05/30/20 1258  BP: (!) 155/67 (!) 122/59 (!) 136/58 133/61  Pulse: (!) 101 80 76 82  Resp: 19 20  20   Temp: 98.5 F (36.9 C) 97.9 F (36.6 C)  98.7 F (37.1 C)  TempSrc: Oral Oral  Oral  SpO2: 95% 98%  96%  Weight:       Height:        Intake/Output Summary (Last 24 hours) at 05/30/2020 1420 Last data filed at 05/30/2020 7564 Gross per 24 hour  Intake 1700.59 ml  Output --  Net 1700.59 ml   Filed Weights   05/29/20 1140 05/29/20 1657  Weight: 104.3 kg 106.9 kg    Exam:       Data Reviewed:   I have personally reviewed following labs and imaging studies:  Labs: Labs show the following:   Basic Metabolic Panel: Recent Labs  Lab 05/29/20 1149 05/30/20 0408  NA 135 137  K 3.4* 3.6  CL 97* 99  CO2 28 28  GLUCOSE 350* 207*  BUN 15 15  CREATININE 1.31* 1.15*  CALCIUM 9.2 9.3   GFR Estimated Creatinine Clearance: 54.7 mL/min (A) (by C-G formula based on SCr of 1.15 mg/dL (H)). Liver Function Tests: Recent Labs  Lab 05/29/20 1149  AST 13*  ALT 10  ALKPHOS 48  BILITOT 0.8  PROT 7.4  ALBUMIN 3.4*   No results for input(s): LIPASE, AMYLASE in the last 168 hours. No results for input(s): AMMONIA in the last 168 hours. Coagulation profile Recent Labs  Lab 05/29/20 1149  INR 1.2    CBC: Recent Labs  Lab 05/29/20 1149 05/30/20 0408  WBC 13.9* 12.4*  NEUTROABS 10.9*  --  HGB 11.2* 10.7*  HCT 35.9* 34.0*  MCV 86.5 86.3  PLT 393 400   Cardiac Enzymes: No results for input(s): CKTOTAL, CKMB, CKMBINDEX, TROPONINI in the last 168 hours. BNP (last 3 results) No results for input(s): PROBNP in the last 8760 hours. CBG: Recent Labs  Lab 05/29/20 1726 05/29/20 2137 05/30/20 0614 05/30/20 1110  GLUCAP 126* 74 256* 310*   D-Dimer: No results for input(s): DDIMER in the last 72 hours. Hgb A1c: Recent Labs    05/29/20 1149  HGBA1C 8.4*   Lipid Profile: No results for input(s): CHOL, HDL, LDLCALC, TRIG, CHOLHDL, LDLDIRECT in the last 72 hours. Thyroid function studies: No results for input(s): TSH, T4TOTAL, T3FREE, THYROIDAB in the last 72 hours.  Invalid input(s): FREET3 Anemia work up: No results for input(s): VITAMINB12, FOLATE, FERRITIN, TIBC, IRON,  RETICCTPCT in the last 72 hours. Sepsis Labs: Recent Labs  Lab 05/29/20 1149 05/29/20 1425 05/30/20 0408  WBC 13.9*  --  12.4*  LATICACIDVEN 2.3* 2.2*  --     Microbiology Recent Results (from the past 240 hour(s))  SARS CORONAVIRUS 2 (TAT 6-24 HRS) Nasopharyngeal Nasopharyngeal Swab     Status: None   Collection Time: 05/29/20  3:03 PM   Specimen: Nasopharyngeal Swab  Result Value Ref Range Status   SARS Coronavirus 2 NEGATIVE NEGATIVE Final    Comment: (NOTE) SARS-CoV-2 target nucleic acids are NOT DETECTED.  The SARS-CoV-2 RNA is generally detectable in upper and lower respiratory specimens during the acute phase of infection. Negative results do not preclude SARS-CoV-2 infection, do not rule out co-infections with other pathogens, and should not be used as the sole basis for treatment or other patient management decisions. Negative results must be combined with clinical observations, patient history, and epidemiological information. The expected result is Negative.  Fact Sheet for Patients: SugarRoll.be  Fact Sheet for Healthcare Providers: https://www.woods-mathews.com/  This test is not yet approved or cleared by the [place] FDA and  has been authorized for detection and/or diagnosis of SARS-CoV-2 by FDA under an Emergency Use Authorization (EUA). This EUA will remain  in effect (meaning this test can be used) for the duration of the COVID-19 declaration under Se ction 564(b)(1) of the Act, 21 U.S.C. section 360bbb-3(b)(1), unless the authorization is terminated or revoked sooner.  Performed at [facility], [place] [address], [place], [place] [address]     Procedures and diagnostic studies:  MR FOOT LEFT W WO CONTRAST  Result Date: 05/30/2020 CLINICAL DATA:  Chronic diabetic foot ulcer and Charcot arthropathy EXAM: MRI OF THE LEFT FOOT WITHOUT AND WITH CONTRAST TECHNIQUE: Multiplanar, multisequence MR imaging  of the left hindfoot was performed before and after the administration of intravenous contrast. CONTRAST:  7.53mL GADAVIST GADOBUTROL 1 MMOL/ML IV SOLN COMPARISON:  X-ray 05/29/2020 FINDINGS: TENDONS Peroneal: Intact peroneus longus and peroneus brevis tendons. No tenosynovitis. Posteromedial: Intact tibialis posterior, flexor hallucis longus and flexor digitorum longus tendons. No significant tenosynovial fluid. Anterior: Intact tibialis anterior, extensor hallucis longus and extensor digitorum longus tendons. No tenosynovitis. Achilles: Intact. Plantar Fascia: Intact. LIGAMENTS Lateral: The anterior and posterior tibiofibular ligaments are intact. The anterior and posterior talofibular ligaments are intact. Intact calcaneofibular ligament. Medial: Deltoid ligament and spring ligament complex intact. CARTILAGE Ankle Joint: No joint effusion or chondral defect. Subtalar Joints/Sinus Tarsi: No joint effusion or chondral defect. Preservation of the anatomic fat within the sinus tarsi. Bones: Chronic midfoot arthropathy with advanced joint space loss, subchondral cyst formation, and marginal osteophytosis. Findings most severely  involves the tarsometatarsal joints and naviculocuneiform joints. No evidence of fracture or dislocation. No evidence of cortical destruction or acute osteomyelitis. No bone marrow edema. No joint effusions. Other: Mild subcutaneous edema, most pronounced over the medial ankle and plantar foot. No deep soft tissue ulceration. No organized or drainable fluid collection. Chronic denervation changes of the intrinsic foot musculature. IMPRESSION: IMPRESSION 1. 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 No evidence of acute osteomyelitis. 2. Mild subcutaneous edema, most pronounced over the medial ankle and plantar foot. No organized or drainable fluid collection. Findings are in agreement with the preliminary report provided by Dr. Joelyn Oms. Electronically Signed    By: Davina Poke D.O.   On: 05/30/2020 11:14   VAS [place] ABI WITH/WO TBI  Result Date: 05/30/2020  LOWER EXTREMITY DOPPLER STUDY Patient Name:  MALLY MARINACCIO  Date of Exam:   05/30/2020 Medical Rec #: [id number]       Accession #:    [id number] Date of Birth: [birth year]       Patient Gender: F Patient Age:   74Y Exam Location:  [facility] Procedure:      VAS [place] ABI WITH/WO TBI Referring Phys: [id number] Lequita Halt --------------------------------------------------------------------------------  Indications: Ulceration. High Risk Factors: Hypertension, hyperlipidemia, Diabetes. Other Factors: Bilateral 2nd, 3rd, and 4th toe amputations.  Comparison Study: Prior study done 11/10/2010 Performing Technologist: Sharion Dove RVS  Examination Guidelines: A complete evaluation includes at minimum, Doppler waveform signals and systolic blood pressure reading at the level of bilateral brachial, anterior tibial, and posterior tibial arteries, when vessel segments are accessible. Bilateral testing is considered an integral part of a complete examination. Photoelectric Plethysmograph (PPG) waveforms and toe systolic pressure readings are included as required and additional duplex testing as needed. Limited examinations for reoccurring indications may be performed as noted.  ABI Findings: +---------+------------------+-----+-----------+--------+ Right    Rt Pressure (mmHg)IndexWaveform   Comment  +---------+------------------+-----+-----------+--------+ Brachial 145                    multiphasic         +---------+------------------+-----+-----------+--------+ PTA      152               1.05 multiphasic         +---------+------------------+-----+-----------+--------+ DP       157               1.08 multiphasic         +---------+------------------+-----+-----------+--------+ Great Toe139               0.96                     +---------+------------------+-----+-----------+--------+  +---------+------------------+-----+-----------+-------+ Left     Lt Pressure (mmHg)IndexWaveform   Comment +---------+------------------+-----+-----------+-------+ Brachial 140                    multiphasic        +---------+------------------+-----+-----------+-------+ PTA      150               1.03 multiphasic        +---------+------------------+-----+-----------+-------+ DP       156               1.08 multiphasic        +---------+------------------+-----+-----------+-------+ Great Toe97                0.67                    +---------+------------------+-----+-----------+-------+ +-------+-----------+-----------+------------+------------+  ABI/TBIToday's ABIToday's TBIPrevious ABIPrevious TBI +-------+-----------+-----------+------------+------------+ Right  1.08       0.96       1.2         0.57         +-------+-----------+-----------+------------+------------+ Left   1.08       0.67       1.2         0.81         +-------+-----------+-----------+------------+------------+  Bilateral ABIs appear essentially unchanged compared to prior study on 11/10/2010. Left TBI appears decreased compared to prior study on 11/10/2010. Right TBI appears increased compared to prior study on 11/10/2010.  Summary: Right: Resting right ankle-brachial index is within normal range. No evidence of significant right lower extremity arterial disease. The right toe-brachial index is normal. Left: Resting left ankle-brachial index is within normal range. No evidence of significant left lower extremity arterial disease. The left toe-brachial index is normal.  *See table(s) above for measurements and observations.     Preliminary     Medications:   . acidophilus  1 capsule Oral Daily  . cholecalciferol  1,000 Units Oral Daily  . dapagliflozin propanediol  5 mg Oral QAC breakfast  . enoxaparin (LOVENOX) injection  50 mg Subcutaneous Q24H  . gabapentin  200 mg Oral QHS  .  insulin aspart  0-15 Units Subcutaneous TID WC  . insulin aspart  6 Units Subcutaneous TID WC  . insulin glargine  20 Units Subcutaneous Daily  . loratadine  10 mg Oral Daily  . metoprolol succinate  25 mg Oral Daily  . pantoprazole  40 mg Oral Daily  . rosuvastatin  20 mg Oral Daily  . triamcinolone cream   Topical BID  . vitamin B-12  1,000 mcg Oral Daily   Continuous Infusions: . piperacillin-tazobactam (ZOSYN)  IV 3.375 g (05/30/20 0514)  . [START ON 05/31/2020] vancomycin       LOS: 1 day   Geradine Girt  [facility]   How to contact the [facility] Attending or Consulting provider [place] or covering provider during after hours [place], for this patient?  1. Check the care team in [facility] and look for a) attending/consulting [facility] provider listed and b) the [facility] team listed 2. Log into [email or web address] and use [place]'s universal password to access. If you do not have the password, please contact the hospital operator. 3. Locate the [facility] provider you are looking for under [facility] and page to a number that you can be directly reached. 4. If you still have difficulty reaching the provider, please page the [facility] (Director on Call) for the Hospitalists listed on amion for assistance.  05/30/2020, 2:20 PM

## 2020-05-31 DIAGNOSIS — L0390 Cellulitis, unspecified: Secondary | ICD-10-CM | POA: Diagnosis not present

## 2020-05-31 DIAGNOSIS — E1151 Type 2 diabetes mellitus with diabetic peripheral angiopathy without gangrene: Secondary | ICD-10-CM | POA: Diagnosis not present

## 2020-05-31 DIAGNOSIS — E1165 Type 2 diabetes mellitus with hyperglycemia: Secondary | ICD-10-CM | POA: Diagnosis not present

## 2020-05-31 LAB — CBC
HCT: 33.5 % — ABNORMAL LOW (ref 36.0–46.0)
Hemoglobin: 10.5 g/dL — ABNORMAL LOW (ref 12.0–15.0)
MCH: 26.8 pg (ref 26.0–34.0)
MCHC: 31.3 g/dL (ref 30.0–36.0)
MCV: 85.5 fL (ref 80.0–100.0)
Platelets: 382 10*3/uL (ref 150–400)
RBC: 3.92 MIL/uL (ref 3.87–5.11)
RDW: 13.3 % (ref 11.5–15.5)
WBC: 8.9 10*3/uL (ref 4.0–10.5)
nRBC: 0 % (ref 0.0–0.2)

## 2020-05-31 LAB — BASIC METABOLIC PANEL
Anion gap: 9 (ref 5–15)
BUN: 14 mg/dL (ref 8–23)
CO2: 31 mmol/L (ref 22–32)
Calcium: 9.2 mg/dL (ref 8.9–10.3)
Chloride: 98 mmol/L (ref 98–111)
Creatinine, Ser: 1.19 mg/dL — ABNORMAL HIGH (ref 0.44–1.00)
GFR, Estimated: 49 mL/min — ABNORMAL LOW (ref >60)
Glucose, Bld: 277 mg/dL — ABNORMAL HIGH (ref 70–99)
Potassium: 4.2 mmol/L (ref 3.5–5.1)
Sodium: 138 mmol/L (ref 135–145)

## 2020-05-31 LAB — GLUCOSE, CAPILLARY
Glucose-Capillary: 259 mg/dL — ABNORMAL HIGH (ref 70–99)
Glucose-Capillary: 277 mg/dL — ABNORMAL HIGH (ref 70–99)
Glucose-Capillary: 199 mg/dL — ABNORMAL HIGH (ref 70–99)
Glucose-Capillary: 196 mg/dL — ABNORMAL HIGH (ref 70–99)

## 2020-05-31 MED ORDER — INSULIN ASPART 100 UNIT/ML IJ SOLN
0.0000 [IU] | Freq: Every day | INTRAMUSCULAR | Status: DC
  Administered 2020-06-01: 2 [IU] via SUBCUTANEOUS

## 2020-05-31 MED ORDER — METFORMIN HCL 500 MG PO TABS
1000.0000 mg | ORAL_TABLET | Freq: Two times a day (BID) | ORAL | Status: DC
  Administered 2020-05-31 – 2020-06-02 (×4): 1000 mg via ORAL
  Filled 2020-05-31 (×4): qty 2

## 2020-05-31 MED ORDER — FLUCONAZOLE 150 MG PO TABS
150.0000 mg | ORAL_TABLET | Freq: Once | ORAL | Status: AC
  Administered 2020-05-31: 150 mg via ORAL
  Filled 2020-05-31: qty 1

## 2020-05-31 MED ORDER — INSULIN ASPART 100 UNIT/ML IJ SOLN
0.0000 [IU] | Freq: Three times a day (TID) | INTRAMUSCULAR | Status: DC
  Administered 2020-05-31: 11 [IU] via SUBCUTANEOUS
  Administered 2020-05-31: 4 [IU] via SUBCUTANEOUS
  Administered 2020-06-01: 7 [IU] via SUBCUTANEOUS
  Administered 2020-06-01: 3 [IU] via SUBCUTANEOUS
  Administered 2020-06-01: 7 [IU] via SUBCUTANEOUS
  Administered 2020-06-02: 4 [IU] via SUBCUTANEOUS
  Administered 2020-06-02: 7 [IU] via SUBCUTANEOUS

## 2020-05-31 MED ORDER — TRAZODONE HCL 50 MG PO TABS
25.0000 mg | ORAL_TABLET | Freq: Every evening | ORAL | Status: DC | PRN
  Administered 2020-05-31 – 2020-06-01 (×3): 25 mg via ORAL
  Filled 2020-05-31 (×3): qty 1

## 2020-05-31 NOTE — Progress Notes (Signed)
Orthopedic Tech Progress Note Patient Details:  Merin Borjon 05/15/1945 [id number] Delivered bone foam to pts room for lower extremity elevation per RN request Patient ID: Miosha Behe, female   DOB: October 15, 1945, 75 y.o.   MRN: [id number]   Tammy Sours 05/31/2020, 11:58 AM

## 2020-05-31 NOTE — Progress Notes (Signed)
Progress Note    Beth Holden  [id number]:[id number] DOB: 03-27-1945  DOA: 05/29/2020 PCP: Vivi Barrack, MD    Brief Narrative:     Medical records reviewed and are as summarized below:  Beth Holden is an 75 y.o. 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 Jess Barters office for evaluation of worsening of left foot ulcer.   Assessment/Plan:   Active Problems:   Uncontrolled type 2 diabetes mellitus with peripheral circulatory disorder (HCC)   Morbid obesity (HCC)   Left foot DM ulcer with cellulitis -MRI ordered: no osteo/no abscess -- per H&P updated Coverage for Dr. Sharol Given-- Dr.  Lorin Mercy who suggested continue abx and outpatient follow up with Dr. Sharol Given -- will reach out to [facility] in the AM -Vanco plus Zosyn for another day-- no prior culture data -- will discuss with ID pharmacy in the AM -ABI normal -elevate extremity -mark boarder of redness  DM -SSI plus resume metformin  HTN -Controlled, continue home BP regimen  DM neuropathy -Continue Gabapentin  obesity Body mass index is 38.04 kg/m.   Family Communication/Anticipated D/C date and plan/Code Status   DVT prophylaxis: Lovenox ordered. Code Status: Full Code.  Disposition Plan: Status is: Inpatient  Remains inpatient appropriate because:Inpatient level of care appropriate due to severity of illness   Dispo: The patient is from: Home              Anticipated d/c is to: Home              Patient currently is not medically stable to d/c. 1-2 days   Difficult to place patient No         Medical Consultants:    Ortho (phone)  Subjective:   Pain improved Did not sleep well last PM  Objective:    Vitals:   05/30/20 1258 05/30/20 1820 05/31/20 0027 05/31/20 0505  BP: 133/61 (!) 131/55 133/71 (!) 124/56  Pulse: 82 74 84 88  Resp: 20 16 18  18   Temp: 98.7 F (37.1 C) 98.2 F (36.8 C) 98.6 F (37 C) 97.7 F (36.5 C)  TempSrc: Oral Oral Oral Oral  SpO2: 96% 98% 97% 95%  Weight:      Height:        Intake/Output Summary (Last 24 hours) at 05/31/2020 1040 Last data filed at 05/31/2020 1000 Gross per 24 hour  Intake 414.48 ml  Output --  Net 414.48 ml   Filed Weights   05/29/20 1140 05/29/20 1657  Weight: 104.3 kg 106.9 kg    Exam: Redness improved A+Ox3 NAD   Data Reviewed:   I have personally reviewed following labs and imaging studies:  Labs: Labs show the following:   Basic Metabolic Panel: Recent Labs  Lab 05/29/20 1149 05/30/20 0408 05/31/20 0352  NA 135 137 138  K 3.4* 3.6 4.2  CL 97* 99 98  CO2 28 28 31   GLUCOSE 350* 207* 277*  BUN 15 15 14   CREATININE 1.31* 1.15* 1.19*  CALCIUM 9.2 9.3 9.2   GFR Estimated Creatinine Clearance: 52.8 mL/min (A) (by C-G formula based on SCr of 1.19 mg/dL (H)). Liver Function Tests: Recent Labs  Lab 05/29/20 1149  AST 13*  ALT 10  ALKPHOS 48  BILITOT 0.8  PROT 7.4  ALBUMIN 3.4*   No results for input(s): LIPASE, AMYLASE in the last 168 hours. No results  for input(s): AMMONIA in the last 168 hours. Coagulation profile Recent Labs  Lab 05/29/20 1149  INR 1.2    CBC: Recent Labs  Lab 05/29/20 1149 05/30/20 0408 05/31/20 0352  WBC 13.9* 12.4* 8.9  NEUTROABS 10.9*  --   --   HGB 11.2* 10.7* 10.5*  HCT 35.9* 34.0* 33.5*  MCV 86.5 86.3 85.5  PLT 393 400 382   Cardiac Enzymes: No results for input(s): CKTOTAL, CKMB, CKMBINDEX, TROPONINI in the last 168 hours. BNP (last 3 results) No results for input(s): PROBNP in the last 8760 hours. CBG: Recent Labs  Lab 05/30/20 0614 05/30/20 1110 05/30/20 1622 05/30/20 2134 05/31/20 0609  GLUCAP 256* 310* 165* 274* 259*   D-Dimer: No results for input(s): DDIMER in the last 72 hours. Hgb A1c: Recent Labs    05/29/20 1149  HGBA1C 8.4*   Lipid Profile: No results for input(s): CHOL,  HDL, LDLCALC, TRIG, CHOLHDL, LDLDIRECT in the last 72 hours. Thyroid function studies: No results for input(s): TSH, T4TOTAL, T3FREE, THYROIDAB in the last 72 hours.  Invalid input(s): FREET3 Anemia work up: No results for input(s): VITAMINB12, FOLATE, FERRITIN, TIBC, IRON, RETICCTPCT in the last 72 hours. Sepsis Labs: Recent Labs  Lab 05/29/20 1149 05/29/20 1425 05/30/20 0408 05/31/20 0352  WBC 13.9*  --  12.4* 8.9  LATICACIDVEN 2.3* 2.2*  --   --     Microbiology Recent Results (from the past 240 hour(s))  Culture, blood (Routine x 2)     Status: None (Preliminary result)   Collection Time: 05/29/20 11:50 AM   Specimen: BLOOD  Result Value Ref Range Status   Specimen Description BLOOD SITE NOT SPECIFIED  Final   Special Requests   Final    BOTTLES DRAWN AEROBIC AND ANAEROBIC Blood Culture results may not be optimal due to an inadequate volume of blood received in culture bottles   Culture   Final    NO GROWTH 2 DAYS Performed at [facility], [place] [address] [place], [place] [address]    Report Status PENDING  Incomplete  Culture, blood (Routine x 2)     Status: None (Preliminary result)   Collection Time: 05/29/20  2:25 PM   Specimen: BLOOD RIGHT ARM  Result Value Ref Range Status   Specimen Description BLOOD RIGHT ARM  Final   Special Requests   Final    BOTTLES DRAWN AEROBIC AND ANAEROBIC Blood Culture adequate volume   Culture   Final    NO GROWTH 2 DAYS Performed at [facility], [place] [address], [place], [place] [address]    Report Status PENDING  Incomplete  SARS CORONAVIRUS 2 (TAT 6-24 HRS) Nasopharyngeal Nasopharyngeal Swab     Status: None   Collection Time: 05/29/20  3:03 PM   Specimen: Nasopharyngeal Swab  Result Value Ref Range Status   SARS Coronavirus 2 NEGATIVE NEGATIVE Final    Comment: (NOTE) SARS-CoV-2 target nucleic acids are NOT DETECTED.  The SARS-CoV-2 RNA is generally detectable in upper and lower respiratory specimens during  the acute phase of infection. Negative results do not preclude SARS-CoV-2 infection, do not rule out co-infections with other pathogens, and should not be used as the sole basis for treatment or other patient management decisions. Negative results must be combined with clinical observations, patient history, and epidemiological information. The expected result is Negative.  Fact Sheet for Patients: SugarRoll.be  Fact Sheet for Healthcare Providers: https://www.woods-mathews.com/  This test is not yet approved or cleared by the [place]  FDA and  has been authorized for detection and/or diagnosis of SARS-CoV-2 by FDA under an Emergency Use Authorization (EUA). This EUA will remain  in effect (meaning this test can be used) for the duration of the COVID-19 declaration under Se ction 564(b)(1) of the Act, 21 U.S.C. section 360bbb-3(b)(1), unless the authorization is terminated or revoked sooner.  Performed at [facility], [place] [address], [place], [place] [address]     Procedures and diagnostic studies:  MR FOOT LEFT W WO CONTRAST  Result Date: 05/30/2020 CLINICAL DATA:  Chronic diabetic foot ulcer and Charcot arthropathy EXAM: MRI OF THE LEFT FOOT WITHOUT AND WITH CONTRAST TECHNIQUE: Multiplanar, multisequence MR imaging of the left hindfoot was performed before and after the administration of intravenous contrast. CONTRAST:  7.70mL GADAVIST GADOBUTROL 1 MMOL/ML IV SOLN COMPARISON:  X-ray 05/29/2020 FINDINGS: TENDONS Peroneal: Intact peroneus longus and peroneus brevis tendons. No tenosynovitis. Posteromedial: Intact tibialis posterior, flexor hallucis longus and flexor digitorum longus tendons. No significant tenosynovial fluid. Anterior: Intact tibialis anterior, extensor hallucis longus and extensor digitorum longus tendons. No tenosynovitis. Achilles: Intact. Plantar Fascia: Intact. LIGAMENTS Lateral: The anterior and posterior  tibiofibular ligaments are intact. The anterior and posterior talofibular ligaments are intact. Intact calcaneofibular ligament. Medial: Deltoid ligament and spring ligament complex intact. CARTILAGE Ankle Joint: No joint effusion or chondral defect. Subtalar Joints/Sinus Tarsi: No joint effusion or chondral defect. Preservation of the anatomic fat within the sinus tarsi. Bones: Chronic midfoot arthropathy with advanced joint space loss, subchondral cyst formation, and marginal osteophytosis. Findings most severely involves the tarsometatarsal joints and naviculocuneiform joints. No evidence of fracture or dislocation. No evidence of cortical destruction or acute osteomyelitis. No bone marrow edema. No joint effusions. Other: Mild subcutaneous edema, most pronounced over the medial ankle and plantar foot. No deep soft tissue ulceration. No organized or drainable fluid collection. Chronic denervation changes of the intrinsic foot musculature. IMPRESSION: IMPRESSION 1. 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 No evidence of acute osteomyelitis. 2. Mild subcutaneous edema, most pronounced over the medial ankle and plantar foot. No organized or drainable fluid collection. Findings are in agreement with the preliminary report provided by Dr. Joelyn Oms. Electronically Signed   By: Davina Poke D.O.   On: 05/30/2020 11:14   VAS [place] ABI WITH/WO TBI  Result Date: 05/30/2020  LOWER EXTREMITY DOPPLER STUDY Patient Name:  Beth Holden  Date of Exam:   05/30/2020 Medical Rec #: [id number]       Accession #:    [id number] Date of Birth: 01/08/1946       Patient Gender: F Patient Age:   74Y Exam Location:  [facility] Procedure:      VAS [place] ABI WITH/WO TBI Referring Phys: [id number] Lequita Halt --------------------------------------------------------------------------------  Indications: Ulceration. High Risk Factors: Hypertension, hyperlipidemia, Diabetes. Other Factors: Bilateral  2nd, 3rd, and 4th toe amputations.  Comparison Study: Prior study done 11/10/2010 Performing Technologist: Sharion Dove RVS  Examination Guidelines: A complete evaluation includes at minimum, Doppler waveform signals and systolic blood pressure reading at the level of bilateral brachial, anterior tibial, and posterior tibial arteries, when vessel segments are accessible. Bilateral testing is considered an integral part of a complete examination. Photoelectric Plethysmograph (PPG) waveforms and toe systolic pressure readings are included as required and additional duplex testing as needed. Limited examinations for reoccurring indications may be performed as noted.  ABI Findings: +---------+------------------+-----+-----------+--------+ Right    Rt Pressure (mmHg)IndexWaveform   Comment  +---------+------------------+-----+-----------+--------+ Brachial 145  multiphasic         +---------+------------------+-----+-----------+--------+ PTA      152               1.05 multiphasic         +---------+------------------+-----+-----------+--------+ DP       157               1.08 multiphasic         +---------+------------------+-----+-----------+--------+ Great Toe139               0.96                     +---------+------------------+-----+-----------+--------+ +---------+------------------+-----+-----------+-------+ Left     Lt Pressure (mmHg)IndexWaveform   Comment +---------+------------------+-----+-----------+-------+ Brachial 140                    multiphasic        +---------+------------------+-----+-----------+-------+ PTA      150               1.03 multiphasic        +---------+------------------+-----+-----------+-------+ DP       156               1.08 multiphasic        +---------+------------------+-----+-----------+-------+ Great Toe97                0.67                     +---------+------------------+-----+-----------+-------+ +-------+-----------+-----------+------------+------------+ ABI/TBIToday's ABIToday's TBIPrevious ABIPrevious TBI +-------+-----------+-----------+------------+------------+ Right  1.08       0.96       1.2         0.57         +-------+-----------+-----------+------------+------------+ Left   1.08       0.67       1.2         0.81         +-------+-----------+-----------+------------+------------+  Bilateral ABIs appear essentially unchanged compared to prior study on 11/10/2010. Left TBI appears decreased compared to prior study on 11/10/2010. Right TBI appears increased compared to prior study on 11/10/2010.  Summary: Right: Resting right ankle-brachial index is within normal range. No evidence of significant right lower extremity arterial disease. The right toe-brachial index is normal. Left: Resting left ankle-brachial index is within normal range. No evidence of significant left lower extremity arterial disease. The left toe-brachial index is normal.  *See table(s) above for measurements and observations.  Electronically signed by Deitra Mayo MD on 05/30/2020 at 3:29:10 PM.    Final     Medications:   . acidophilus  1 capsule Oral Daily  . cholecalciferol  1,000 Units Oral Daily  . enoxaparin (LOVENOX) injection  50 mg Subcutaneous Q24H  . fluconazole  150 mg Oral Once  . gabapentin  300 mg Oral QHS  . insulin aspart  0-20 Units Subcutaneous TID WC  . insulin aspart  0-5 Units Subcutaneous QHS  . insulin aspart  6 Units Subcutaneous TID WC  . insulin glargine  20 Units Subcutaneous Daily  . loratadine  10 mg Oral Daily  . metFORMIN  1,000 mg Oral BID WC  . metoprolol succinate  25 mg Oral Daily  . pantoprazole  40 mg Oral Daily  . rosuvastatin  20 mg Oral Daily  . triamcinolone cream   Topical BID  . vitamin B-12  1,000 mcg Oral Daily   Continuous Infusions: . piperacillin-tazobactam (ZOSYN)  IV 3.375 g  (05/30/20  2229)  . vancomycin 1,250 mg (05/31/20 0422)     LOS: 2 days   Geradine Girt  [facility]   How to contact the [facility] Attending or Consulting provider [place] or covering provider during after hours [place], for this patient?  1. Check the care team in [facility] and look for a) attending/consulting TRH provider listed and b) the [facility] team listed 2. Log into [email or web address] and use [place]'s universal password to access. If you do not have the password, please contact the hospital operator. 3. Locate the [facility] provider you are looking for under [facility] and page to a number that you can be directly reached. 4. If you still have difficulty reaching the provider, please page the [facility] (Director on Call) for the Hospitalists listed on amion for assistance.  05/31/2020, 10:40 AM

## 2020-06-01 DIAGNOSIS — E1151 Type 2 diabetes mellitus with diabetic peripheral angiopathy without gangrene: Secondary | ICD-10-CM | POA: Diagnosis not present

## 2020-06-01 DIAGNOSIS — L0390 Cellulitis, unspecified: Secondary | ICD-10-CM | POA: Diagnosis not present

## 2020-06-01 DIAGNOSIS — E1165 Type 2 diabetes mellitus with hyperglycemia: Secondary | ICD-10-CM | POA: Diagnosis not present

## 2020-06-01 LAB — BASIC METABOLIC PANEL
Anion gap: 11 (ref 5–15)
BUN: 15 mg/dL (ref 8–23)
CO2: 28 mmol/L (ref 22–32)
Calcium: 9.4 mg/dL (ref 8.9–10.3)
Chloride: 99 mmol/L (ref 98–111)
Creatinine, Ser: 1.09 mg/dL — ABNORMAL HIGH (ref 0.44–1.00)
GFR, Estimated: 54 mL/min — ABNORMAL LOW (ref >60)
Glucose, Bld: 254 mg/dL — ABNORMAL HIGH (ref 70–99)
Potassium: 3.7 mmol/L (ref 3.5–5.1)
Sodium: 138 mmol/L (ref 135–145)

## 2020-06-01 LAB — GLUCOSE, CAPILLARY
Glucose-Capillary: 218 mg/dL — ABNORMAL HIGH (ref 70–99)
Glucose-Capillary: 213 mg/dL — ABNORMAL HIGH (ref 70–99)
Glucose-Capillary: 150 mg/dL — ABNORMAL HIGH (ref 70–99)
Glucose-Capillary: 232 mg/dL — ABNORMAL HIGH (ref 70–99)

## 2020-06-01 MED ORDER — VANCOMYCIN HCL 1000 MG/200ML IV SOLN
1000.0000 mg | INTRAVENOUS | Status: DC
  Administered 2020-06-01: 1000 mg via INTRAVENOUS
  Filled 2020-06-01 (×2): qty 200

## 2020-06-01 NOTE — Progress Notes (Signed)
Progress Note    Beth Holden  [id number] DOB: December 15, 1945  DOA: 05/29/2020 PCP: Beth Barrack, MD    Brief Narrative:     Medical records reviewed and are as summarized below:  Beth Holden is an 75 y.o. 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 Jess Barters office for evaluation of worsening of left foot ulcer.   Assessment/Plan:   Active Problems:   Uncontrolled type 2 diabetes mellitus with peripheral circulatory disorder (HCC)   Morbid obesity (HCC)   Left foot DM ulcer with cellulitis -MRI ordered: no osteo/no abscess -- per H&P updated Coverage for Dr. Sharol Given-- Dr.  Lorin Mercy who suggested continue abx and outpatient follow up with Dr. Sharol Given -- will reach out to [name] in the AM (did not work on Monday) -Vanco plus Zosyn for another day-- no prior culture data -- will discuss with ID pharmacy for de-escalation -ABI normal -elevate extremity  DM -SSI plus resume metformin  HTN -Controlled, continue home BP regimen  DM neuropathy -Continue Gabapentin  obesity Body mass index is 38.04 kg/m.   Family Communication/Anticipated D/C date and plan/Code Status   DVT prophylaxis: Lovenox ordered. Code Status: Full Code.  Disposition Plan: Status is: Inpatient  Remains inpatient appropriate because:Inpatient level of care appropriate due to severity of illness   Dispo: The patient is from: Home              Anticipated d/c is to: Home              Patient currently is not medically stable to d/c. 1-2 days   Difficult to place patient No         Medical Consultants:    Ortho (phone)  Subjective:   No current complaints  Objective:    Vitals:   05/31/20 1715 06/01/20 0017 06/01/20 0505 06/01/20 1243  BP: 125/76 138/61 132/68 139/61  Pulse: 67 78 87 69  Resp: 18 18 18 16   Temp: 97.9 F  (36.6 C) 97.9 F (36.6 C) 97.9 F (36.6 C) 98.1 F (36.7 C)  TempSrc: Oral Oral Oral Oral  SpO2: 96% 96% 95% 97%  Weight:      Height:        Intake/Output Summary (Last 24 hours) at 06/01/2020 1319 Last data filed at 05/31/2020 1800 Gross per 24 hour  Intake 360 ml  Output --  Net 360 ml   Filed Weights   05/29/20 1140 05/29/20 1657  Weight: 104.3 kg 106.9 kg    Exam: In chair, legs elevated    5/21:      Data Reviewed:   I have personally reviewed following labs and imaging studies:  Labs: Labs show the following:   Basic Metabolic Panel: Recent Labs  Lab 05/29/20 1149 05/30/20 0408 05/31/20 0352 06/01/20 0719  NA 135 137 138 138  K 3.4* 3.6 4.2 3.7  CL 97* 99 98 99  CO2 28 28 31 28   GLUCOSE 350* 207* 277* 254*  BUN 15 15 14 15   CREATININE 1.31* 1.15* 1.19* 1.09*  CALCIUM 9.2 9.3 9.2 9.4   GFR Estimated Creatinine Clearance: 57.7 mL/min (A) (by C-G formula based on SCr of 1.09 mg/dL (H)). Liver Function Tests: Recent Labs  Lab 05/29/20 1149  AST 13*  ALT 10  ALKPHOS 48  BILITOT 0.8  PROT 7.4  ALBUMIN 3.4*   No results for  input(s): LIPASE, AMYLASE in the last 168 hours. No results for input(s): AMMONIA in the last 168 hours. Coagulation profile Recent Labs  Lab 05/29/20 1149  INR 1.2    CBC: Recent Labs  Lab 05/29/20 1149 05/30/20 0408 05/31/20 0352  WBC 13.9* 12.4* 8.9  NEUTROABS 10.9*  --   --   HGB 11.2* 10.7* 10.5*  HCT 35.9* 34.0* 33.5*  MCV 86.5 86.3 85.5  PLT 393 400 382   Cardiac Enzymes: No results for input(s): CKTOTAL, CKMB, CKMBINDEX, TROPONINI in the last 168 hours. BNP (last 3 results) No results for input(s): PROBNP in the last 8760 hours. CBG: Recent Labs  Lab 05/31/20 1109 05/31/20 1556 05/31/20 2131 06/01/20 0614 06/01/20 1107  GLUCAP 277* 199* 196* 218* 213*   D-Dimer: No results for input(s): DDIMER in the last 72 hours. Hgb A1c: No results for input(s): HGBA1C in the last 72 hours. Lipid  Profile: No results for input(s): CHOL, HDL, LDLCALC, TRIG, CHOLHDL, LDLDIRECT in the last 72 hours. Thyroid function studies: No results for input(s): TSH, T4TOTAL, T3FREE, THYROIDAB in the last 72 hours.  Invalid input(s): FREET3 Anemia work up: No results for input(s): VITAMINB12, FOLATE, FERRITIN, TIBC, IRON, RETICCTPCT in the last 72 hours. Sepsis Labs: Recent Labs  Lab 05/29/20 1149 05/29/20 1425 05/30/20 0408 05/31/20 0352  WBC 13.9*  --  12.4* 8.9  LATICACIDVEN 2.3* 2.2*  --   --     Microbiology Recent Results (from the past 240 hour(s))  Culture, blood (Routine x 2)     Status: None (Preliminary result)   Collection Time: 05/29/20 11:50 AM   Specimen: BLOOD  Result Value Ref Range Status   Specimen Description BLOOD SITE NOT SPECIFIED  Final   Special Requests   Final    BOTTLES DRAWN AEROBIC AND ANAEROBIC Blood Culture results may not be optimal due to an inadequate volume of blood received in culture bottles   Culture   Final    NO GROWTH 3 DAYS Performed at [facility], [place] [address], [place], [place] [address]    Report Status PENDING  Incomplete  Culture, blood (Routine x 2)     Status: None (Preliminary result)   Collection Time: 05/29/20  2:25 PM   Specimen: BLOOD RIGHT ARM  Result Value Ref Range Status   Specimen Description BLOOD RIGHT ARM  Final   Special Requests   Final    BOTTLES DRAWN AEROBIC AND ANAEROBIC Blood Culture adequate volume   Culture   Final    NO GROWTH 3 DAYS Performed at [facility], [place] [address] [place], [place] [address]    Report Status PENDING  Incomplete  SARS CORONAVIRUS 2 (TAT 6-24 HRS) Nasopharyngeal Nasopharyngeal Swab     Status: None   Collection Time: 05/29/20  3:03 PM   Specimen: Nasopharyngeal Swab  Result Value Ref Range Status   SARS Coronavirus 2 NEGATIVE NEGATIVE Final    Comment: (NOTE) SARS-CoV-2 target nucleic acids are NOT DETECTED.  The SARS-CoV-2 RNA is generally detectable in upper  and lower respiratory specimens during the acute phase of infection. Negative results do not preclude SARS-CoV-2 infection, do not rule out co-infections with other pathogens, and should not be used as the sole basis for treatment or other patient management decisions. Negative results must be combined with clinical observations, patient history, and epidemiological information. The expected result is Negative.  Fact Sheet for Patients: SugarRoll.be  Fact Sheet for Healthcare Providers: https://www.woods-mathews.com/  This test is not  yet approved or cleared by the [place] and  has been authorized for detection and/or diagnosis of SARS-CoV-2 by FDA under an Emergency Use Authorization (EUA). This EUA will remain  in effect (meaning this test can be used) for the duration of the COVID-19 declaration under Se ction 564(b)(1) of the Act, 21 U.S.C. section 360bbb-3(b)(1), unless the authorization is terminated or revoked sooner.  Performed at [facility], [place] [address], [place], [place] [address]     Procedures and diagnostic studies:  No results found.  Medications:   . acidophilus  1 capsule Oral Daily  . cholecalciferol  1,000 Units Oral Daily  . enoxaparin (LOVENOX) injection  50 mg Subcutaneous Q24H  . gabapentin  300 mg Oral QHS  . insulin aspart  0-20 Units Subcutaneous TID WC  . insulin aspart  0-5 Units Subcutaneous QHS  . insulin aspart  6 Units Subcutaneous TID WC  . insulin glargine  20 Units Subcutaneous Daily  . loratadine  10 mg Oral Daily  . metFORMIN  1,000 mg Oral BID WC  . metoprolol succinate  25 mg Oral Daily  . pantoprazole  40 mg Oral Daily  . rosuvastatin  20 mg Oral Daily  . triamcinolone cream   Topical BID  . vitamin B-12  1,000 mcg Oral Daily   Continuous Infusions: . piperacillin-tazobactam (ZOSYN)  IV 3.375 g (06/01/20 0555)  . vancomycin       LOS: 3 days   Geradine Girt  [facility]   How to contact the [facility] Attending or Consulting provider [place] or covering provider during after hours [place], for this patient?  1. Check the care team in [facility] and look for a) attending/consulting TRH provider listed and b) the [facility] team listed 2. Log into [email or web address] and use [place]'s universal password to access. If you do not have the password, please contact the hospital operator. 3. Locate the [facility] provider you are looking for under [facility] and page to a number that you can be directly reached. 4. If you still have difficulty reaching the provider, please page the [facility] (Director on Call) for the Hospitalists listed on amion for assistance.  06/01/2020, 1:19 PM

## 2020-06-01 NOTE — Progress Notes (Signed)
Pharmacy Antibiotic Note  Beth Holden is a 75 y.o. female admitted on 05/29/2020 presenting with worsening ulcer with ascending cellulitis to extremity, concern for osteo on plain film.  LA 2.3.  Pharmacy has been consulted for Vancomycin + Zosyn dosing.  SCr improved to 1.09 - will adjust the Vancomycin dose. Zosyn dose remains appropriate.   Plan: - Adjust Vanc to 1g/24h (eAUC 459, SCr 1.09, Vd 0.5) - Cont Zosyn 3.375g EI every 8 hours - Appears plan is to consult ID for antibiotic selection and LOT - will f/u on this.   Height: 5\' 6"  (167.6 cm) Weight: 106.9 kg (235 lb 10.8 oz) IBW/kg (Calculated) : 59.3  Temp (24hrs), Avg:98 F (36.7 C), Min:97.9 F (36.6 C), Max:98.1 F (36.7 C)  Recent Labs  Lab 05/29/20 1149 05/29/20 1425 05/30/20 0408 05/31/20 0352 06/01/20 0719  WBC 13.9*  --  12.4* 8.9  --   CREATININE 1.31*  --  1.15* 1.19* 1.09*  LATICACIDVEN 2.3* 2.2*  --   --   --     Estimated Creatinine Clearance: 57.7 mL/min (A) (by C-G formula based on SCr of 1.09 mg/dL (H)).    Allergies  Allergen Reactions  . Codeine Other (See Comments)    Makes her crazy  . Propoxyphene Hcl Itching    *DARVOCET     Thank you for allowing pharmacy to be a part of this patient's care.  Alycia Rossetti, PharmD, BCPS Clinical Pharmacist Clinical phone for 06/01/2020: [phone] 06/01/2020 12:51 PM   **Pharmacist phone directory can now be found on [email or web address] ([email or web address]).  Listed under [place].

## 2020-06-01 NOTE — Progress Notes (Signed)
Inpatient Diabetes Program Recommendations  AACE/ADA: New Consensus Statement on Inpatient Glycemic Control  Target Ranges:  Prepandial:   less than 140 mg/dL      Peak postprandial:   less than 180 mg/dL (1-2 hours)      Critically ill patients:  140 - 180 mg/dL   Results for Beth Holden, Beth "CINDY" (MRN [id number]) as of 06/01/2020 12:42  Ref. Range 05/31/2020 06:09 05/31/2020 11:09 05/31/2020 15:56 05/31/2020 21:31 06/01/2020 06:14 06/01/2020 11:07  Glucose-Capillary Latest Ref Range: 70 - 99 mg/dL 259 (H) 277 (H) 199 (H) 196 (H) 218 (H) 213 (H)   Review of Glycemic Control   Current orders for Inpatient glycemic control: Lantus 20 units daily, Novolog 0-20 units TID with meals, Novolog 0-5 units QHS, Novolog 6 units TID with meals  Inpatient Diabetes Program Recommendations:    Insulin: Please consider increasing Lantus to 24 units daily and meal coverage to Novolog 10 units TID with meals.  Thanks, Barnie Alderman, RN, MSN, CDE Diabetes Coordinator Inpatient Diabetes Program [phone] (Team Pager from 8am to 5pm)

## 2020-06-01 NOTE — Care Management Important Message (Signed)
Important Message  Patient Details  Name: Beth Holden MRN: [id number] Date of Birth: [birth year]   Medicare Important Message Given:  Yes     [name] P [place] 06/01/2020, 2:07 PM

## 2020-06-02 LAB — GLUCOSE, CAPILLARY
Glucose-Capillary: 197 mg/dL — ABNORMAL HIGH (ref 70–99)
Glucose-Capillary: 228 mg/dL — ABNORMAL HIGH (ref 70–99)

## 2020-06-02 MED ORDER — AMOXICILLIN-POT CLAVULANATE 875-125 MG PO TABS
1.0000 | ORAL_TABLET | Freq: Two times a day (BID) | ORAL | Status: DC
  Administered 2020-06-02: 1 via ORAL
  Filled 2020-06-02: qty 1

## 2020-06-02 MED ORDER — INSULIN NPH (HUMAN) (ISOPHANE) 100 UNIT/ML ~~LOC~~ SUSP: 15.0000 [IU] | SUBCUTANEOUS | Status: DC

## 2020-06-02 MED ORDER — AMOXICILLIN-POT CLAVULANATE 875-125 MG PO TABS: 1.0000 | ORAL_TABLET | Freq: Two times a day (BID) | ORAL | 0 refills | Status: DC

## 2020-06-02 MED ORDER — INSULIN REGULAR HUMAN 100 UNIT/ML IJ SOLN: 20.0000 [IU] | Freq: Three times a day (TID) | INTRAMUSCULAR | Status: AC

## 2020-06-02 MED ORDER — FLUCONAZOLE 150 MG PO TABS: 150.0000 mg | ORAL_TABLET | Freq: Once | ORAL | 0 refills | Status: DC | PRN

## 2020-06-02 NOTE — Discharge Summary (Signed)
Physician Discharge Summary  Beth Holden IHW:[id number] DOB: Jun 20, 1945 DOA: 05/29/2020  PCP: Beth Barrack, MD  Admit date: 05/29/2020 Discharge date: 06/02/2020  Admitted From: home Discharge disposition: home   Recommendations for Outpatient Follow-Up:   1. Close follow-up with orthopedics 2. Patient to elevate extremities and wear stockings as Holden to her by Beth Holden   Discharge Diagnosis:   Active Problems:   Uncontrolled type 2 diabetes mellitus with peripheral circulatory disorder ([place])   Morbid obesity ([place])    Discharge Condition: Improved.  Diet recommendation: Low sodium, heart healthy.  Carbohydrate-modified.   Wound care: None.  Code status: Full.   History of Present Illness:   Beth Holden is an 75 y.o. female with medical history significant ofchronic diabetic ulcer of foot status post multiple toe amputations, Charcot foot bilaterally,IDDM, HTN, DM neuropathy, HLD,who was sent from Beth Holden office for evaluation of worsening of left foot ulcer.    Hospital Course by Problem:   Left foot DM ulcer withcellulitis -MRI ordered: no osteo/no abscess -- per H&P updated Coverage for Beth Holden-- Dr.  Lorin Holden who suggested continue abx and outpatient follow up with Beth Holden --discussed with Beth Holden and Beth Holden office and she agrees patient has improved based on photos -Vanco plus Zosyn, have de-escalated to p.o. antibiotics as directed by pharmacy -ABI normal -elevate extremity  DM -SSI plus resume metformin -Needs better control  HTN -Controlled, continue home BP regimen  DM neuropathy -Continue Gabapentin  obesity Body mass index is 38.04 kg/m.     Medical Consultants:      Discharge Exam:   Vitals:   06/01/20 2323 06/02/20 0509  BP: 130/68 (!) 141/71  Pulse: 78 84  Resp: 18 18  Temp: 98.5 F (36.9 C) 98.1 F (36.7 C)  SpO2: 98% 97%   Vitals:   06/01/20 1243 06/01/20 1851 06/01/20 2323 06/02/20  0509  BP: 139/61 (!) 149/68 130/68 (!) 141/71  Pulse: 69 79 78 84  Resp: _0 Temp: 98.1 F (36.7 C) 98.4 F (36.9 C) 98.5 F (36.9 C) 98.1 F (36.7 C)  TempSrc: Oral Oral Oral Oral  SpO2: 97% 97% 98% 97%  Weight:      Height:        General exam: Appears calm and comfortable.    The results of significant diagnostics from this hospitalization (including imaging, microbiology, ancillary and laboratory) are listed below for reference.     Procedures and Diagnostic Studies:   MR FOOT LEFT W WO CONTRAST  Result Date: 05/30/2020 CLINICAL DATA:  Chronic diabetic foot ulcer and Charcot arthropathy EXAM: MRI OF THE LEFT FOOT WITHOUT AND WITH CONTRAST TECHNIQUE: Multiplanar, multisequence MR imaging of the left hindfoot was performed before and after the administration of intravenous contrast. CONTRAST:  7.39m GADAVIST GADOBUTROL 1 MMOL/ML IV SOLN COMPARISON:  X-ray 05/29/2020 FINDINGS: TENDONS Peroneal: Intact peroneus longus and peroneus brevis tendons. No tenosynovitis. Posteromedial: Intact tibialis posterior, flexor hallucis longus and flexor digitorum longus tendons. No significant tenosynovial fluid. Anterior: Intact tibialis anterior, extensor hallucis longus and extensor digitorum longus tendons. No tenosynovitis. Achilles: Intact. Plantar Fascia: Intact. LIGAMENTS Lateral: The anterior and posterior tibiofibular ligaments are intact. The anterior and posterior talofibular ligaments are intact. Intact calcaneofibular ligament. Medial: Deltoid ligament and spring ligament complex intact. CARTILAGE Ankle Joint: No joint effusion or chondral defect. Subtalar Joints/Sinus Tarsi: No joint effusion or chondral defect. Preservation of the anatomic fat within the sinus tarsi. Bones: Chronic midfoot arthropathy  with advanced joint space loss, subchondral cyst formation, and marginal osteophytosis. Findings most severely involves the tarsometatarsal joints and naviculocuneiform joints. No  evidence of fracture or dislocation. No evidence of cortical destruction or acute osteomyelitis. No bone marrow edema. No joint effusions. Other: Mild subcutaneous edema, most pronounced over the medial ankle and plantar foot. No deep soft tissue ulceration. No organized or drainable fluid collection. Chronic denervation changes of the intrinsic foot musculature. IMPRESSION: IMPRESSION 1. 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 No evidence of acute osteomyelitis. 2. Mild subcutaneous edema, most pronounced over the medial ankle and plantar foot. No organized or drainable fluid collection. Findings are in agreement with the preliminary report provided by Dr. Joelyn Oms. Electronically Signed   By: Davina Poke D.O.   On: 05/30/2020 11:14   VAS [place] ABI WITH/WO TBI  Result Date: 05/30/2020  LOWER EXTREMITY DOPPLER STUDY Patient Name:  Beth Holden  Date of Exam:   05/30/2020 Medical Rec #: [id number]       Accession #:    [id number] Date of Birth: 08/20/1945       Patient Gender: F Patient Age:   74Y Exam Location:  [facility] Procedure:      VAS [place] ABI WITH/WO TBI Referring Phys: [id number] Lequita Halt --------------------------------------------------------------------------------  Indications: Ulceration. High Risk Factors: Hypertension, hyperlipidemia, Diabetes. Other Factors: Bilateral 2nd, 3rd, and 4th toe amputations.  Comparison Study: Prior study done 11/10/2010 Performing Technologist: Sharion Dove RVS  Examination Guidelines: A complete evaluation includes at minimum, Doppler waveform signals and systolic blood pressure reading at the level of bilateral brachial, anterior tibial, and posterior tibial arteries, when vessel segments are accessible. Bilateral testing is considered an integral part of a complete examination. Photoelectric Plethysmograph (PPG) waveforms and toe systolic pressure readings are included as required and additional duplex testing as  needed. Limited examinations for reoccurring indications may be performed as noted.  ABI Findings: +---------+------------------+-----+-----------+--------+ Right    Rt Pressure (mmHg)IndexWaveform   Comment  +---------+------------------+-----+-----------+--------+ Brachial 145                    multiphasic         +---------+------------------+-----+-----------+--------+ PTA      152               1.05 multiphasic         +---------+------------------+-----+-----------+--------+ DP       157               1.08 multiphasic         +---------+------------------+-----+-----------+--------+ Great Toe139               0.96                     +---------+------------------+-----+-----------+--------+ +---------+------------------+-----+-----------+-------+ Left     Lt Pressure (mmHg)IndexWaveform   Comment +---------+------------------+-----+-----------+-------+ Brachial 140                    multiphasic        +---------+------------------+-----+-----------+-------+ PTA      150               1.03 multiphasic        +---------+------------------+-----+-----------+-------+ DP       156               1.08 multiphasic        +---------+------------------+-----+-----------+-------+ Great Toe97                0.67                    +---------+------------------+-----+-----------+-------+ +-------+-----------+-----------+------------+------------+  ABI/TBIToday's ABIToday's TBIPrevious ABIPrevious TBI +-------+-----------+-----------+------------+------------+ Right  1.08       0.96       1.2         0.57         +-------+-----------+-----------+------------+------------+ Left   1.08       0.67       1.2         0.81         +-------+-----------+-----------+------------+------------+  Bilateral ABIs appear essentially unchanged compared to prior study on 11/10/2010. Left TBI appears decreased compared to prior study on 11/10/2010. Right TBI  appears increased compared to prior study on 11/10/2010.  Summary: Right: Resting right ankle-brachial index is within normal range. No evidence of significant right lower extremity arterial disease. The right toe-brachial index is normal. Left: Resting left ankle-brachial index is within normal range. No evidence of significant left lower extremity arterial disease. The left toe-brachial index is normal.  *See table(s) above for measurements and observations.  Electronically signed by Deitra Mayo MD on 05/30/2020 at 3:29:10 PM.    Final      Labs:   Basic Metabolic Panel: Recent Labs  Lab 05/29/20 1149 05/30/20 0408 05/31/20 0352 06/01/20 0719  NA 135 137 138 138  K 3.4* 3.6 4.2 3.7  CL 97* 99 98 99  CO2 _0 GLUCOSE 350* 207* 277* 254*  BUN _1 CREATININE 1.31* 1.15* 1.19* 1.09*  CALCIUM 9.2 9.3 9.2 9.4   GFR Estimated Creatinine Clearance: 57.7 mL/min (A) (by C-G formula based on SCr of 1.09 mg/dL (H)). Liver Function Tests: Recent Labs  Lab 05/29/20 1149  AST 13*  ALT 10  ALKPHOS 48  BILITOT 0.8  PROT 7.4  ALBUMIN 3.4*   No results for input(s): LIPASE, AMYLASE in the last 168 hours. No results for input(s): AMMONIA in the last 168 hours. Coagulation profile Recent Labs  Lab 05/29/20 1149  INR 1.2    CBC: Recent Labs  Lab 05/29/20 1149 05/30/20 0408 05/31/20 0352  WBC 13.9* 12.4* 8.9  NEUTROABS 10.9*  --   --   HGB 11.2* 10.7* 10.5*  HCT 35.9* 34.0* 33.5*  MCV 86.5 86.3 85.5  PLT 393 400 382   Cardiac Enzymes: No results for input(s): CKTOTAL, CKMB, CKMBINDEX, TROPONINI in the last 168 hours. BNP: Invalid input(s): POCBNP CBG: Recent Labs  Lab 06/01/20 0614 06/01/20 1107 06/01/20 1605 06/01/20 2112 06/02/20 0605  GLUCAP 218* 213* 150* 232* 197*   D-Dimer No results for input(s): DDIMER in the last 72 hours. Hgb A1c No results for input(s): HGBA1C in the last 72 hours. Lipid Profile No results for input(s): CHOL,  HDL, LDLCALC, TRIG, CHOLHDL, LDLDIRECT in the last 72 hours. Thyroid function studies No results for input(s): TSH, T4TOTAL, T3FREE, THYROIDAB in the last 72 hours.  Invalid input(s): FREET3 Anemia work up No results for input(s): VITAMINB12, FOLATE, FERRITIN, TIBC, IRON, RETICCTPCT in the last 72 hours. Microbiology Recent Results (from the past 240 hour(s))  Culture, blood (Routine x 2)     Status: None (Preliminary result)   Collection Time: 05/29/20 11:50 AM   Specimen: BLOOD  Result Value Ref Range Status   Specimen Description BLOOD SITE NOT SPECIFIED  Final   Special Requests   Final    BOTTLES DRAWN AEROBIC AND ANAEROBIC Blood Culture results may not be optimal due to an inadequate volume of blood received in culture bottles   Culture   Final    NO GROWTH  4 DAYS Performed at [facility], [place] [address], [place], [place] [address]    Report Status PENDING  Incomplete  Culture, blood (Routine x 2)     Status: None (Preliminary result)   Collection Time: 05/29/20  2:25 PM   Specimen: BLOOD RIGHT ARM  Result Value Ref Range Status   Specimen Description BLOOD RIGHT ARM  Final   Special Requests   Final    BOTTLES DRAWN AEROBIC AND ANAEROBIC Blood Culture adequate volume   Culture   Final    NO GROWTH 4 DAYS Performed at [facility], [place] [address], [place], [place] [address]    Report Status PENDING  Incomplete  SARS CORONAVIRUS 2 (TAT 6-24 HRS) Nasopharyngeal Nasopharyngeal Swab     Status: None   Collection Time: 05/29/20  3:03 PM   Specimen: Nasopharyngeal Swab  Result Value Ref Range Status   SARS Coronavirus 2 NEGATIVE NEGATIVE Final    Comment: (NOTE) SARS-CoV-2 target nucleic acids are NOT DETECTED.  The SARS-CoV-2 RNA is generally detectable in upper and lower respiratory specimens during the acute phase of infection. Negative results do not preclude SARS-CoV-2 infection, do not rule out co-infections with other pathogens, and should not be used  as the sole basis for treatment or other patient management decisions. Negative results must be combined with clinical observations, patient history, and epidemiological information. The expected result is Negative.  Fact Sheet for Patients: SugarRoll.be  Fact Sheet for Healthcare Providers: https://www.woods-mathews.com/  This test is not yet approved or cleared by the [place] FDA and  has been authorized for detection and/or diagnosis of SARS-CoV-2 by FDA under an Emergency Use Authorization (EUA). This EUA will remain  in effect (meaning this test can be used) for the duration of the COVID-19 declaration under Se ction 564(b)(1) of the Act, 21 U.S.C. section 360bbb-3(b)(1), unless the authorization is terminated or revoked sooner.  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   amoxicillin-clavulanate 875-125 MG tablet Commonly known as: AUGMENTIN Take 1 tablet by mouth every 12 (twelve) hours.   azelastine 0.1 % nasal spray Commonly known as: ASTELIN Place 2 sprays into both nostrils 2 (two) times daily. What changed:   when to take this  reasons to take this   betamethasone  dipropionate 0.05 % cream Apply 1 application topically 2 (two) times daily as needed (IRRITATION).   cetirizine 10 MG tablet Commonly known as: ZYRTEC Take 1 tablet by mouth once daily   cholecalciferol 1000 units tablet Commonly known as: VITAMIN D Take 1  tablet by mouth daily.   dapagliflozin propanediol 5 MG Tabs tablet Commonly known as: Farxiga Take 1 tablet (5 mg total) by mouth daily before breakfast.   dicyclomine 10 MG capsule Commonly known as: BENTYL Take 1 capsule (10 mg total) by mouth 3 (three) times daily as needed for spasms.   fluconazole 150 MG tablet Commonly known as: Diflucan Take 1 tablet (150 mg total) by mouth once as needed for up to 1 dose (vaginal discharge/itching).   Fluocinolone Acetonide 0.01 % Oil Use 1-2 drops daily as needed. What changed:   how much to take  how to take this  when to take this  reasons to take this  additional instructions   gabapentin 100 MG capsule Commonly known as: NEURONTIN TAKE 2 TO 3 CAPSULES AT BEDTIME What changed:   how much to take  how to take this  when to take this  additional instructions   glucagon 1 MG Solr injection Commonly known as: GLUCAGEN Inject 1 mg into the muscle once as needed for up to 1 dose for low blood sugar.   insulin NPH Human 100 UNIT/ML injection Commonly known as: NovoLIN N ReliOn Inject 0.15-0.2 mLs (15-20 Units total) into the skin See admin instructions. Inject under skin 12-15 units in am  and 10-15 units at bedtime per sliding scale   insulin regular 100 units/mL injection Commonly known as: NovoLIN R ReliOn Inject 0.2-0.3 mLs (20-30 Units total) into the skin 3 (three) times daily before meals. Per sliding scale   Insulin Syringe-Needle U-100 31G X 5/16" 1 ML Misc Commonly known as: RELION INSULIN SYRINGE 1ML/31G USE 2 TIMES A DAY What changed:   how much to take  how to take this  when to take this  additional instructions    irbesartan-hydrochlorothiazide 150-12.5 MG tablet Commonly known as: AVALIDE Take 1 tablet by mouth daily.   metFORMIN 1000 MG tablet Commonly known as: GLUCOPHAGE Take 1 tablet (1,000 mg total) by mouth 2 (two) times daily with a meal.   metoprolol succinate 25 MG 24 hr tablet Commonly known as: TOPROL-XL Take 1 tablet (25 mg total) by mouth daily.   mupirocin ointment 2 % Commonly known as: BACTROBAN Apply 1 application topically daily.   omeprazole 40 MG capsule Commonly known as: PRILOSEC Take 1 capsule (40 mg total) by mouth 2 (two) times daily.   ONE TOUCH ULTRA 2 w/Device Kit Use to check blood sugar What changed:   how much to take  how to take this  when to take this  additional instructions   OneTouch Ultra test strip Generic drug: glucose blood USE 1 STRIP TO CHECK GLUCOSE THREE TIMES DAILY What changed: See the new instructions.   OVER THE COUNTER MEDICATION Apply 1-3 application topically at bedtime. TOPRICIN FOOT CREAM - NEUROPATHY FOOT CREAM **APPLIES TO BOTH FEET AT BEDTIME**   PROBIOTIC DAILY PO Take 1 capsule by mouth daily.   promethazine-dextromethorphan 6.25-15 MG/5ML syrup Commonly known as: PROMETHAZINE-DM Take 2.5 mLs by mouth 4 (four) times daily as needed for cough.   rosuvastatin 20 MG tablet Commonly known as: Crestor Take 1 tablet (20 mg total) by mouth daily.   silver sulfADIAZINE 1 % cream Commonly known as: Silvadene Apply 1 application topically daily.   vitamin B-12 1000 MCG tablet Commonly known as: CYANOCOBALAMIN Take 1,000 mcg daily by mouth.       Follow-up Information    Beth Barrack, MD Follow up in 1 week(s).   Specialty: Family Medicine  Contact information: [place] [address] [phone]        Newt Minion, MD. Schedule an appointment as soon as possible for a visit in 1 week(s).   Specialty: Orthopedic Surgery Contact information: [address] [place]  [address] [phone]                Time coordinating discharge: 35 min Signed:  Geradine Girt DO  [facility] 06/02/2020, 10:51 AM

## 2020-06-03 LAB — CULTURE, BLOOD (ROUTINE X 2)
Culture: NO GROWTH
Culture: NO GROWTH
Special Requests: ADEQUATE

## 2020-06-04 ENCOUNTER — Telehealth: Payer: Self-pay

## 2020-06-04 NOTE — Telephone Encounter (Cosign Needed)
Transition Care Management Follow-up Telephone Call  Date of discharge and from where: [place] 06/02/20  How have you been since you were released from the hospital? Good  Any questions or concerns? No  Items Reviewed:  Did the pt receive and understand the discharge instructions provided? Yes   Medications obtained and verified? Yes   Other? No   Any new allergies since your discharge? No   Dietary orders reviewed? Yes  Do you have support at home? Yes   Home Care and Equipment/Supplies: Were home health services ordered? not applicable If so, what is the name of the agency?   Has the agency set up a time to come to the patient's home? not applicable Were any new equipment or medical supplies ordered?  No What is the name of the medical supply agency?  Were you able to get the supplies/equipment? not applicable Do you have any questions related to the use of the equipment or supplies? No  Functional Questionnaire: (I = Independent and D = Dependent) ADLs: I  Bathing/Dressing- I  Meal Prep- I  Eating- I  Maintaining continence- I  Transferring/Ambulation- I  Managing Meds- I  Follow up appointments reviewed:   PCP Hospital f/u appt confirmed? No    Specialist Hospital f/u appt confirmed? Yes  Scheduled to see Dr Sharol Given on 06/11/20 @ 1:15.  Are transportation arrangements needed? No   If their condition worsens, is the pt aware to call PCP or go to the Emergency Dept.? Yes  Was the patient provided with contact information for the PCP's office or ED? Yes  Was to pt encouraged to call back with questions or concerns? Yes

## 2020-06-11 ENCOUNTER — Ambulatory Visit (INDEPENDENT_AMBULATORY_CARE_PROVIDER_SITE_OTHER): Payer: Medicare Other | Admitting: Orthopedic Surgery

## 2020-06-11 DIAGNOSIS — L97521 Non-pressure chronic ulcer of other part of left foot limited to breakdown of skin: Secondary | ICD-10-CM

## 2020-06-11 DIAGNOSIS — B351 Tinea unguium: Secondary | ICD-10-CM

## 2020-06-11 DIAGNOSIS — M14672 Charcot's joint, left ankle and foot: Secondary | ICD-10-CM

## 2020-06-18 ENCOUNTER — Ambulatory Visit: Payer: Medicare Other | Admitting: Orthopedic Surgery

## 2020-06-21 ENCOUNTER — Encounter: Payer: Self-pay | Admitting: Orthopedic Surgery

## 2020-06-21 NOTE — Progress Notes (Signed)
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  Patient recently went to the emergency room on 05/29/2020 she states she was discharged on 06/02/2020 she did have an MRI scan that was negative for an osteomyelitis she has had ankle-brachial indices which were normal she received IV antibiotics during her hospitalization and has completed a course of Augmentin she is wearing compression stockings her A1c is 8.4.  Assessment & Plan: Visit Diagnoses:  1. Ulcer of left foot, limited to breakdown of skin ([place])   2. Charcot's joint, left ankle and foot   3. Onychomycosis     Plan: Nails were trimmed without complication ulcer debrided left foot.  No clinical signs of infection will resume wound care.  Follow-Up Instructions: Return in about 4 weeks (around 07/09/2020).   Ortho Exam  Patient is alert, oriented, no adenopathy, well-dressed, normal affect, normal respiratory effort. Examination patient has a palpable pulse she has a stable left foot with no acute Charcot changes no cellulitis.  She does have a Wagner grade 1 ulcer beneath the Charcot collapse.  The ulcer is 1 cm diameter after informed consent a 10 blade knife was used debride the skin and soft tissue back to healthy viable tissue after debridement the ulcer was 3 cm in diameter and 1 mm deep.  She also had a callus on the fourth metatarsal head which was also pared and this was 2 cm after paring.  She has onychomycotic nails and these were trimmed no signs of paronychial infection.  Imaging: No results found. No images are attached to the  encounter.  Labs: Lab Results  Component Value Date   HGBA1C 8.4 (H) 05/29/2020   HGBA1C 8.0 (A) 03/18/2020   HGBA1C 8.2 (A) 12/19/2019   ESRSEDRATE 51 (H) 12/24/2019   ESRSEDRATE 36 (H) 02/24/2017   CRP 13.2 (H) 12/24/2019   CRP 1.2 (H) 02/24/2017   LABURIC 5.5 09/01/2015   REPTSTATUS 06/03/2020 FINAL 05/29/2020   CULT  05/29/2020    NO GROWTH 5 DAYS Performed at [facility], [place] [address] [place], [place] [address]    LABORGA GROUP B STREP (S.AGALACTIAE) ISOLATED 12/26/2014     Lab Results  Component Value Date   ALBUMIN 3.4 (L) 05/29/2020   ALBUMIN 3.6 12/26/2019   ALBUMIN 4.0 12/19/2019   PREALBUMIN 19.4 03/05/2012    No results found for: MG No results found for: VD25OH  Lab Results  Component Value Date   PREALBUMIN 19.4 03/05/2012   CBC EXTENDED Latest Ref Rng & Units 05/31/2020 05/30/2020 05/29/2020  WBC 4.0 - 10.5 K/uL 8.9 12.4(H) 13.9(H)  RBC 3.87 - 5.11 MIL/uL 3.92 3.94 4.15  HGB 12.0 - 15.0 g/dL 10.5(L) 10.7(L) 11.2(L)  HCT 36.0 - 46.0 % 33.5(L) 34.0(L) 35.9(L)  PLT 150 - 400 K/uL 382 400 393  NEUTROABS 1.7 - 7.7 K/uL - - 10.9(H)  LYMPHSABS 0.7 - 4.0 K/uL - - 2.0  There is no height or weight on file to calculate BMI.  Orders:  No orders of the defined types were placed in this encounter.  No orders of the defined types were placed in this encounter.    Procedures: No procedures performed  Clinical Data: No additional findings.  ROS:  All other systems negative, except as noted in the HPI. Review of Systems  Objective: Vital Signs: There were no vitals taken for this visit.  Specialty Comments:  No specialty comments available.  PMFS History: Patient Active Problem List   Diagnosis Date Noted   Memory loss 11/21/2019   Mild nonproliferative diabetic retinopathy of both eyes ([place]) 10/23/2019   Nuclear sclerotic cataract of both eyes 10/23/2019   Posterior vitreous detachment of right eye 10/23/2019   Retinal  hemorrhage of left eye 10/23/2019   Hyperlipidemia associated with type 2 diabetes mellitus ([place]) 11/30/2017   Impingement syndrome of right shoulder 07/06/2017   Iron deficiency anemia 05/11/2017   Morbid obesity ([place]) 04/24/2017   Osteomyelitis ([place])    Anemia 02/24/2017   Baker's cyst 07/10/2016   Onychomycosis 12/07/2015   Upper airway cough syndrome 03/05/2014   History of amputation of lesser toe of right foot ([place]) 04/15/2013   Hypertension associated with diabetes ([place]) 12/05/2006   Uncontrolled type 2 diabetes mellitus with peripheral circulatory disorder ([place]) 10/12/2006   Dyslipidemia associated with type 2 diabetes mellitus ([place]) 10/12/2006   Allergic rhinitis 10/12/2006   GERD - Followed by Dr. Fuller Plan 10/12/2006   IRRITABLE BOWEL SYNDROME - followed by Dr. Fuller Plan 10/12/2006   Peripheral neuropathy 10/11/2006   ALOPECIA NEC 10/11/2006   Past Medical History:  Diagnosis Date   Alopecia    Anemia, mild    Arthritis    Chronic cough    sees pulmonologist   Chronic pain    chest wall and abd - s/p extensive eval   Diabetes mellitus with neuropathy (HCC)    sees endocrine   Diverticulosis    Fatty liver    GERD (gastroesophageal reflux disease)    takes Nexium bid, hx erosive esophagitis   Headache(784.0)    occasionally;r/t sinus    History of colon polyps    HTN (hypertension)    Hx of amputation of lesser toe (HCC)    sees podiatrist   Hyperlipemia    IBS (irritable bowel syndrome)    Insomnia    takes Elavil nightly   Joint pain    Neuropathy    Osteomyelitis (HCC)    Pneumonia    PONV (postoperative nausea and vomiting)    Seasonal allergies    takes Zyrtec daily   Sinus tachycardia     Family History  Problem Relation Age of Onset   Lung cancer Mother 19       smoked heavily   Emphysema Mother    Hypertension Father    Hyperlipidemia Father    Diabetes Father    Coronary artery disease Father    Dementia Father    COPD Father        smoked    Stomach cancer Paternal Aunt    Brain cancer Paternal Uncle    Irritable bowel syndrome Other        Several family members on fathers side    Diabetes Other    Stomach cancer Maternal Aunt    Lung cancer Paternal Uncle    Heart disease Paternal Uncle    Colon cancer Neg Hx     Past Surgical History:  Procedure Laterality  Date   AMPUTATION  12/28/2011   Procedure: AMPUTATION DIGIT;  Surgeon: Newt Minion, MD;  Location: [place];  Service: Orthopedics;  Laterality: Right;  Right Foot 2nd Toe Amputation at MTP (metatarsophalangeal joint)   AMPUTATION Right 04/25/2012   Procedure: Right Foot 3rd Toe Amputation;  Surgeon: Newt Minion, MD;  Location: [place] [facility];  Service: Orthopedics;  Laterality: Right;  Right Foot Third Toe Amputation    AMPUTATION Right 07/27/2012   Procedure: Right 4th Toe Amputation at Metatarsophalangeal;  Surgeon: Newt Minion, MD;  Location: [place] [facility];  Service: Orthopedics;  Laterality: Right;  Right 4th Toe Amputation at Metatarsophalangeal   AMPUTATION Left 07/15/2016   Procedure: Left 2nd Ray Amputation;  Surgeon: Newt Minion, MD;  Location: [place];  Service: Orthopedics;  Laterality: Left;   AMPUTATION Left 11/18/2016   Procedure: Left 3rd and 4th Ray Amputation;  Surgeon: Newt Minion, MD;  Location: [facility];  Service: Orthopedics;  Laterality: Left;   AMPUTATION Right 03/29/2017   Procedure: RIGHT FOOT 3RD AND 4TH RAY AMPUTATION;  Surgeon: Newt Minion, MD;  Location: [facility];  Service: Orthopedics;  Laterality: Right;   COLONOSCOPY     LAPAROSCOPIC APPENDECTOMY  01/05/2011   Procedure: APPENDECTOMY LAPAROSCOPIC;  Surgeon: Pedro Earls, MD;  [facility];  Service: General;  Laterality: N/A;   OOPHORECTOMY  2001   ROTATOR CUFF REPAIR Right    x 2   TUBAL LIGATION     VAGINAL HYSTERECTOMY  2001   Social History   Occupational History   Occupation: Retired   Tobacco Use   Smoking status: Never   Smokeless tobacco: Never  Vaping Use   Vaping Use: Never  used  Substance and Sexual Activity   Alcohol use: No   Drug use: No   Sexual activity: Yes    Birth control/protection: Surgical

## 2020-06-23 ENCOUNTER — Ambulatory Visit (INDEPENDENT_AMBULATORY_CARE_PROVIDER_SITE_OTHER): Payer: Medicare Other | Admitting: Orthopedic Surgery

## 2020-06-23 DIAGNOSIS — L97521 Non-pressure chronic ulcer of other part of left foot limited to breakdown of skin: Secondary | ICD-10-CM

## 2020-06-23 DIAGNOSIS — M14672 Charcot's joint, left ankle and foot: Secondary | ICD-10-CM

## 2020-06-30 ENCOUNTER — Ambulatory Visit (INDEPENDENT_AMBULATORY_CARE_PROVIDER_SITE_OTHER): Payer: Medicare Other | Admitting: Orthopedic Surgery

## 2020-06-30 ENCOUNTER — Encounter: Payer: Self-pay | Admitting: Orthopedic Surgery

## 2020-06-30 DIAGNOSIS — L97521 Non-pressure chronic ulcer of other part of left foot limited to breakdown of skin: Secondary | ICD-10-CM

## 2020-06-30 DIAGNOSIS — M14672 Charcot's joint, left ankle and foot: Secondary | ICD-10-CM | POA: Diagnosis not present

## 2020-06-30 NOTE — Progress Notes (Signed)
Office Visit Note   [name]           Date of Birth: 1945-07-29           MRN: [id number] Visit Date: 06/30/2020              Requested by: Vivi Barrack, MD [address] [place],  [place] [address] PCP: Vivi Barrack, MD  Chief Complaint  Patient presents with   Left Foot - Follow-up      HPI: Patient is a 75 year old woman who presents in follow-up for a large ulcer left foot with Charcot collapse.  She states she feels like the Charcot on the right foot is collapsing further as well.  Assessment & Plan: Visit Diagnoses:  1. Ulcer of left foot, limited to breakdown of skin ([place])   2. Charcot's joint, left ankle and foot     Plan: Patient has shown excellent improvement in the wound on the left foot.  Recommend she continue with the knee-high compression stockings recommended that she try to resume wearing her extra-depth shoes to further support her feet she will also bring in her double upright brace to see if that is appropriate at this time.  Follow-Up Instructions: Return in about 2 weeks (around 07/14/2020).   Ortho Exam  Patient is alert, oriented, no adenopathy, well-dressed, normal affect, normal respiratory effort. Examination there is no redness or cellulitis in either foot the dermatitis is improving on the dorsum of the left foot.  The large deep ulcer over the rocker-bottom deformity is shown remarkable improvement with wearing the postoperative shoe and the compression stockings.  Imaging: No results found. No images are attached to the encounter.  Labs: Lab Results  Component Value Date   HGBA1C 8.4 (H) 05/29/2020   HGBA1C 8.0 (A) 03/18/2020   HGBA1C 8.2 (A) 12/19/2019   ESRSEDRATE 51 (H) 12/24/2019   ESRSEDRATE 36 (H) 02/24/2017   CRP 13.2 (H) 12/24/2019   CRP 1.2 (H) 02/24/2017   LABURIC 5.5 09/01/2015   REPTSTATUS 06/03/2020 FINAL 05/29/2020   CULT  05/29/2020    NO GROWTH 5 DAYS Performed at [facility], [place]  [address], [place], [place] [address]    LABORGA GROUP B STREP (S.AGALACTIAE) ISOLATED 12/26/2014     Lab Results  Component Value Date   ALBUMIN 3.4 (L) 05/29/2020   ALBUMIN 3.6 12/26/2019   ALBUMIN 4.0 12/19/2019   PREALBUMIN 19.4 03/05/2012    No results found for: MG No results found for: VD25OH  Lab Results  Component Value Date   PREALBUMIN 19.4 03/05/2012   CBC EXTENDED Latest Ref Rng & Units 05/31/2020 05/30/2020 05/29/2020  WBC 4.0 - 10.5 K/uL 8.9 12.4(H) 13.9(H)  RBC 3.87 - 5.11 MIL/uL 3.92 3.94 4.15  HGB 12.0 - 15.0 g/dL 10.5(L) 10.7(L) 11.2(L)  HCT 36.0 - 46.0 % 33.5(L) 34.0(L) 35.9(L)  PLT 150 - 400 K/uL 382 400 393  NEUTROABS 1.7 - 7.7 K/uL - - 10.9(H)  LYMPHSABS 0.7 - 4.0 K/uL - - 2.0     There is no height or weight on file to calculate BMI.  Orders:  No orders of the defined types were placed in this encounter.  No orders of the defined types were placed in this encounter.    Procedures: No procedures performed  Clinical Data: No additional findings.  ROS:  All other systems negative, except as noted in the HPI. Review of Systems  Objective: Vital Signs: There were no vitals taken for this  visit.  Specialty Comments:  No specialty comments available.  PMFS History: Patient Active Problem List   Diagnosis Date Noted   Memory loss 11/21/2019   Mild nonproliferative diabetic retinopathy of both eyes ([place]) 10/23/2019   Nuclear sclerotic cataract of both eyes 10/23/2019   Posterior vitreous detachment of right eye 10/23/2019   Retinal hemorrhage of left eye 10/23/2019   Hyperlipidemia associated with type 2 diabetes mellitus ([place]) 11/30/2017   Impingement syndrome of right shoulder 07/06/2017   Iron deficiency anemia 05/11/2017   Morbid obesity ([place]) 04/24/2017   Osteomyelitis ([place])    Anemia 02/24/2017   Baker's cyst 07/10/2016   Onychomycosis 12/07/2015   Upper airway cough syndrome 03/05/2014   History of amputation of lesser toe of right  foot ([place]) 04/15/2013   Hypertension associated with diabetes ([place]) 12/05/2006   Uncontrolled type 2 diabetes mellitus with peripheral circulatory disorder ([place]) 10/12/2006   Dyslipidemia associated with type 2 diabetes mellitus ([place]) 10/12/2006   Allergic rhinitis 10/12/2006   GERD - Followed by Dr. Fuller Plan 10/12/2006   IRRITABLE BOWEL SYNDROME - followed by Dr. Fuller Plan 10/12/2006   Peripheral neuropathy 10/11/2006   ALOPECIA NEC 10/11/2006   Past Medical History:  Diagnosis Date   Alopecia    Anemia, mild    Arthritis    Chronic cough    sees pulmonologist   Chronic pain    chest wall and abd - s/p extensive eval   Diabetes mellitus with neuropathy (HCC)    sees endocrine   Diverticulosis    Fatty liver    GERD (gastroesophageal reflux disease)    takes Nexium bid, hx erosive esophagitis   Headache(784.0)    occasionally;r/t sinus    History of colon polyps    HTN (hypertension)    Hx of amputation of lesser toe (HCC)    sees podiatrist   Hyperlipemia    IBS (irritable bowel syndrome)    Insomnia    takes Elavil nightly   Joint pain    Neuropathy    Osteomyelitis (HCC)    Pneumonia    PONV (postoperative nausea and vomiting)    Seasonal allergies    takes Zyrtec daily   Sinus tachycardia     Family History  Problem Relation Age of Onset   Lung cancer Mother 68       smoked heavily   Emphysema Mother    Hypertension Father    Hyperlipidemia Father    Diabetes Father    Coronary artery disease Father    Dementia Father    COPD Father        smoked   Stomach cancer Paternal Aunt    Brain cancer Paternal Uncle    Irritable bowel syndrome Other        Several family members on fathers side    Diabetes Other    Stomach cancer Maternal Aunt    Lung cancer Paternal Uncle    Heart disease Paternal Uncle    Colon cancer Neg Hx     Past Surgical History:  Procedure Laterality Date   AMPUTATION  12/28/2011   Procedure: AMPUTATION DIGIT;  Surgeon: Newt Minion,  MD;  Location: [facility];  Service: Orthopedics;  Laterality: Right;  Right Foot 2nd Toe Amputation at MTP (metatarsophalangeal joint)   AMPUTATION Right 04/25/2012   Procedure: Right Foot 3rd Toe Amputation;  Surgeon: Newt Minion, MD;  Location: [facility];  Service: Orthopedics;  Laterality: Right;  Right Foot Third Toe Amputation    AMPUTATION Right  07/27/2012   Procedure: Right 4th Toe Amputation at Metatarsophalangeal;  Surgeon: Newt Minion, MD;  Location: [facility];  Service: Orthopedics;  Laterality: Right;  Right 4th Toe Amputation at Metatarsophalangeal   AMPUTATION Left 07/15/2016   Procedure: Left 2nd Ray Amputation;  Surgeon: Newt Minion, MD;  Location: [facility];  Service: Orthopedics;  Laterality: Left;   AMPUTATION Left 11/18/2016   Procedure: Left 3rd and 4th Ray Amputation;  Surgeon: Newt Minion, MD;  Location: [facility];  Service: Orthopedics;  Laterality: Left;   AMPUTATION Right 03/29/2017   Procedure: RIGHT FOOT 3RD AND 4TH RAY AMPUTATION;  Surgeon: Newt Minion, MD;  Location: [facility];  Service: Orthopedics;  Laterality: Right;   COLONOSCOPY     LAPAROSCOPIC APPENDECTOMY  01/05/2011   Procedure: APPENDECTOMY LAPAROSCOPIC;  Surgeon: Pedro Earls, MD;  [facility];  Service: General;  Laterality: N/A;   OOPHORECTOMY  2001   ROTATOR CUFF REPAIR Right    x 2   TUBAL LIGATION     VAGINAL HYSTERECTOMY  2001   Social History   Occupational History   Occupation: Retired   Tobacco Use   Smoking status: Never   Smokeless tobacco: Never  Vaping Use   Vaping Use: Never used  Substance and Sexual Activity   Alcohol use: No   Drug use: No   Sexual activity: Yes    Birth control/protection: Surgical

## 2020-07-02 ENCOUNTER — Other Ambulatory Visit: Payer: Medicare Other

## 2020-07-02 ENCOUNTER — Other Ambulatory Visit: Payer: Self-pay

## 2020-07-02 ENCOUNTER — Inpatient Hospital Stay: Payer: Medicare Other | Attending: Oncology

## 2020-07-02 ENCOUNTER — Encounter: Payer: Self-pay | Admitting: Orthopedic Surgery

## 2020-07-02 DIAGNOSIS — D509 Iron deficiency anemia, unspecified: Secondary | ICD-10-CM | POA: Insufficient documentation

## 2020-07-02 LAB — CBC WITH DIFFERENTIAL (CANCER CENTER ONLY)
Abs Immature Granulocytes: 0.05 10*3/uL (ref 0.00–0.07)
Basophils Absolute: 0.1 10*3/uL (ref 0.0–0.1)
Basophils Relative: 1 %
Eosinophils Absolute: 0.3 10*3/uL (ref 0.0–0.5)
Eosinophils Relative: 3 %
HCT: 35.5 % — ABNORMAL LOW (ref 36.0–46.0)
Hemoglobin: 11.1 g/dL — ABNORMAL LOW (ref 12.0–15.0)
Immature Granulocytes: 0 %
Lymphocytes Relative: 22 %
Lymphs Abs: 2.6 10*3/uL (ref 0.7–4.0)
MCH: 26.6 pg (ref 26.0–34.0)
MCHC: 31.3 g/dL (ref 30.0–36.0)
MCV: 84.9 fL (ref 80.0–100.0)
Monocytes Absolute: 0.9 10*3/uL (ref 0.1–1.0)
Monocytes Relative: 8 %
Neutro Abs: 7.8 10*3/uL — ABNORMAL HIGH (ref 1.7–7.7)
Neutrophils Relative %: 66 %
Platelet Count: 383 10*3/uL (ref 150–400)
RBC: 4.18 MIL/uL (ref 3.87–5.11)
RDW: 13.7 % (ref 11.5–15.5)
WBC Count: 11.7 10*3/uL — ABNORMAL HIGH (ref 4.0–10.5)
nRBC: 0 % (ref 0.0–0.2)

## 2020-07-02 LAB — FERRITIN: Ferritin: 40 ng/mL (ref 11–307)

## 2020-07-02 NOTE — Progress Notes (Signed)
Office Visit Note   [name]           Date of Birth: June 25, 1945           MRN: [id number] Visit Date: 06/23/2020              Requested by: Vivi Barrack, [place],  [place] [address] PCP: Vivi Barrack, MD  Chief Complaint  Patient presents with   Left Foot - Pain      HPI: Patient is a 75 year old woman who presents in follow-up for a recurrent ulcer Charcot rocker-bottom deformity left foot.  She states she is had increased pain for the past 4 days she has been wearing her compression sock.  Assessment & Plan: Visit Diagnoses:  1. Ulcer of left foot, limited to breakdown of skin ([place])   2. Charcot's joint, left ankle and foot     Plan:  Plan for three-view radiographs of the left foot at follow-up.  She will resume wearing her compression socks continue with her postoperative shoe.  Follow-Up Instructions: Return in about 1 week (around 06/30/2020).   Ortho Exam  Patient is alert, oriented, no adenopathy, well-dressed, normal affect, normal respiratory effort. Examination patient has a good dorsalis pedis pulse there is no tenderness to palpation around the foot she does have redness in the foot which decreases with elevation she does have venous stasis swelling without cellulitis.  She does have a recurrent Wagner grade 1 ulcer beneath the Charcot rocker-bottom deformity of the left foot.  This is 1 cm diameter.  After informed consent a 10 blade knife was used to debride the skin and soft tissue back to healthy viable granulation tissue this was touched with silver nitrate after debridement the ulcer was 2 cm in diameter 1 mm deep.  Imaging: No results found. No images are attached to the encounter.  Labs: Lab Results  Component Value Date   HGBA1C 8.4 (H) 05/29/2020   HGBA1C 8.0 (A) 03/18/2020   HGBA1C 8.2 (A) 12/19/2019   ESRSEDRATE 51 (H) 12/24/2019   ESRSEDRATE 36 (H) 02/24/2017   CRP 13.2 (H) 12/24/2019   CRP 1.2 (H)  02/24/2017   LABURIC 5.5 09/01/2015   REPTSTATUS 06/03/2020 FINAL 05/29/2020   CULT  05/29/2020    NO GROWTH 5 DAYS Performed at [facility], [place] [address] [place], [place] [address]    LABORGA GROUP B STREP (S.AGALACTIAE) ISOLATED 12/26/2014     Lab Results  Component Value Date   ALBUMIN 3.4 (L) 05/29/2020   ALBUMIN 3.6 12/26/2019   ALBUMIN 4.0 12/19/2019   PREALBUMIN 19.4 03/05/2012    No results found for: MG No results found for: VD25OH  Lab Results  Component Value Date   PREALBUMIN 19.4 03/05/2012   CBC EXTENDED Latest Ref Rng & Units 07/02/2020 05/31/2020 05/30/2020  WBC 4.0 - 10.5 K/uL 11.7(H) 8.9 12.4(H)  RBC 3.87 - 5.11 MIL/uL 4.18 3.92 3.94  HGB 12.0 - 15.0 g/dL 11.1(L) 10.5(L) 10.7(L)  HCT 36.0 - 46.0 % 35.5(L) 33.5(L) 34.0(L)  PLT 150 - 400 K/uL 383 382 400  NEUTROABS 1.7 - 7.7 K/uL 7.8(H) - -  LYMPHSABS 0.7 - 4.0 K/uL 2.6 - -     There is no height or weight on file to calculate BMI.  Orders:  No orders of the defined types were placed in this encounter.  No orders of the defined types were placed in this encounter.    Procedures: No procedures performed  Clinical Data: No additional findings.  ROS:  All other systems negative, except as noted in the HPI. Review of Systems  Objective: Vital Signs: There were no vitals taken for this visit.  Specialty Comments:  No specialty comments available.  PMFS History: Patient Active Problem List   Diagnosis Date Noted   Memory loss 11/21/2019   Mild nonproliferative diabetic retinopathy of both eyes ([place]) 10/23/2019   Nuclear sclerotic cataract of both eyes 10/23/2019   Posterior vitreous detachment of right eye 10/23/2019   Retinal hemorrhage of left eye 10/23/2019   Hyperlipidemia associated with type 2 diabetes mellitus ([place]) 11/30/2017   Impingement syndrome of right shoulder 07/06/2017   Iron deficiency anemia 05/11/2017   Morbid obesity ([place]) 04/24/2017   Osteomyelitis ([place])     Anemia 02/24/2017   Baker's cyst 07/10/2016   Onychomycosis 12/07/2015   Upper airway cough syndrome 03/05/2014   History of amputation of lesser toe of right foot ([place]) 04/15/2013   Hypertension associated with diabetes ([place]) 12/05/2006   Uncontrolled type 2 diabetes mellitus with peripheral circulatory disorder ([place]) 10/12/2006   Dyslipidemia associated with type 2 diabetes mellitus ([place]) 10/12/2006   Allergic rhinitis 10/12/2006   GERD - Followed by Dr. Fuller Plan 10/12/2006   IRRITABLE BOWEL SYNDROME - followed by Dr. Fuller Plan 10/12/2006   Peripheral neuropathy 10/11/2006   ALOPECIA NEC 10/11/2006   Past Medical History:  Diagnosis Date   Alopecia    Anemia, mild    Arthritis    Chronic cough    sees pulmonologist   Chronic pain    chest wall and abd - s/p extensive eval   Diabetes mellitus with neuropathy (HCC)    sees endocrine   Diverticulosis    Fatty liver    GERD (gastroesophageal reflux disease)    takes Nexium bid, hx erosive esophagitis   Headache(784.0)    occasionally;r/t sinus    History of colon polyps    HTN (hypertension)    Hx of amputation of lesser toe (HCC)    sees podiatrist   Hyperlipemia    IBS (irritable bowel syndrome)    Insomnia    takes Elavil nightly   Joint pain    Neuropathy    Osteomyelitis (HCC)    Pneumonia    PONV (postoperative nausea and vomiting)    Seasonal allergies    takes Zyrtec daily   Sinus tachycardia     Family History  Problem Relation Age of Onset   Lung cancer Mother 69       smoked heavily   Emphysema Mother    Hypertension Father    Hyperlipidemia Father    Diabetes Father    Coronary artery disease Father    Dementia Father    COPD Father        smoked   Stomach cancer Paternal Aunt    Brain cancer Paternal Uncle    Irritable bowel syndrome Other        Several family members on fathers side    Diabetes Other    Stomach cancer Maternal Aunt    Lung cancer Paternal Uncle    Heart disease Paternal  Uncle    Colon cancer Neg Hx     Past Surgical History:  Procedure Laterality Date   AMPUTATION  12/28/2011   Procedure: AMPUTATION DIGIT;  Surgeon: Newt Minion, MD;  Location: [facility];  Service: Orthopedics;  Laterality: Right;  Right Foot 2nd Toe Amputation at MTP (metatarsophalangeal joint)   AMPUTATION Right 04/25/2012   Procedure:  Right Foot 3rd Toe Amputation;  Surgeon: Newt Minion, MD;  Location: [facility];  Service: Orthopedics;  Laterality: Right;  Right Foot Third Toe Amputation    AMPUTATION Right 07/27/2012   Procedure: Right 4th Toe Amputation at Metatarsophalangeal;  Surgeon: Newt Minion, MD;  Location: [facility];  Service: Orthopedics;  Laterality: Right;  Right 4th Toe Amputation at Metatarsophalangeal   AMPUTATION Left 07/15/2016   Procedure: Left 2nd Ray Amputation;  Surgeon: Newt Minion, MD;  Location: [place] [facility];  Service: Orthopedics;  Laterality: Left;   AMPUTATION Left 11/18/2016   Procedure: Left 3rd and 4th Ray Amputation;  Surgeon: Newt Minion, MD;  Location: [place] [facility];  Service: Orthopedics;  Laterality: Left;   AMPUTATION Right 03/29/2017   Procedure: RIGHT FOOT 3RD AND 4TH RAY AMPUTATION;  Surgeon: Newt Minion, MD;  Location: [facility];  Service: Orthopedics;  Laterality: Right;   COLONOSCOPY     LAPAROSCOPIC APPENDECTOMY  01/05/2011   Procedure: APPENDECTOMY LAPAROSCOPIC;  Surgeon: Pedro Earls, MD;  [facility];  Service: General;  Laterality: N/A;   OOPHORECTOMY  2001   ROTATOR CUFF REPAIR Right    x 2   TUBAL LIGATION     VAGINAL HYSTERECTOMY  2001   Social History   Occupational History   Occupation: Retired   Tobacco Use   Smoking status: Never   Smokeless tobacco: Never  Vaping Use   Vaping Use: Never used  Substance and Sexual Activity   Alcohol use: No   Drug use: No   Sexual activity: Yes    Birth control/protection: Surgical

## 2020-07-09 ENCOUNTER — Ambulatory Visit: Payer: Medicare Other | Admitting: Internal Medicine

## 2020-07-09 ENCOUNTER — Ambulatory Visit: Payer: Medicare Other | Admitting: Orthopedic Surgery

## 2020-07-11 ENCOUNTER — Other Ambulatory Visit: Payer: Self-pay | Admitting: Internal Medicine

## 2020-07-11 DIAGNOSIS — IMO0002 Reserved for concepts with insufficient information to code with codable children: Secondary | ICD-10-CM

## 2020-07-11 DIAGNOSIS — E1151 Type 2 diabetes mellitus with diabetic peripheral angiopathy without gangrene: Secondary | ICD-10-CM

## 2020-07-14 ENCOUNTER — Ambulatory Visit (INDEPENDENT_AMBULATORY_CARE_PROVIDER_SITE_OTHER): Payer: Medicare Other | Admitting: Orthopedic Surgery

## 2020-07-14 ENCOUNTER — Encounter: Payer: Self-pay | Admitting: Orthopedic Surgery

## 2020-07-14 VITALS — Ht 66.0 in | Wt 235.0 lb

## 2020-07-14 DIAGNOSIS — M14672 Charcot's joint, left ankle and foot: Secondary | ICD-10-CM | POA: Diagnosis not present

## 2020-07-14 DIAGNOSIS — L97521 Non-pressure chronic ulcer of other part of left foot limited to breakdown of skin: Secondary | ICD-10-CM | POA: Diagnosis not present

## 2020-07-14 NOTE — Progress Notes (Signed)
Office Visit Note   [name]           Date of Birth: [birth year]           MRN: [id number] Visit Date: 07/14/2020              Requested by: Vivi Barrack, MD [address] 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  Patient was supposed to bring her extra-depth shoes and custom orthotics but she feels like they were left at the beach she will be heading to the beach this weekend.  Assessment & Plan: Visit Diagnoses:  1. Ulcer of left foot, limited to breakdown of skin ([place])   2. Charcot's joint, left ankle and foot     Plan: Recommended wear her protective shoe wear continue with the knee-high compression stockings.  Follow-Up Instructions: Return in about 3 weeks (around 08/04/2020).   Ortho Exam  Patient is alert, oriented, no adenopathy, well-dressed, normal affect, normal respiratory effort. Examination patient has a good pulse the ulcer beneath the Charcot collapse is improving.  After informed consent 10 blade knife was used to debride the skin and soft tissue and callus back to healthy viable tissue.  Post debridement the ulcer is 2 cm in diameter 1 mm deep this appears to be healthy and healing well.  There is no cellulitis no active Charcot process.  Patient has developed a new ulcer beneath the fourth metatarsal head.  This was also debrided of skin and soft tissue with a 10 blade knife back to healthy viable bleeding granulation tissue no exposed bone or tendon the ulcer is 10 mm in diameter and 3 mm deep.  Band-Aids were applied to both wounds.  Imaging: No results found. No images are attached to the encounter.  Labs: Lab Results  Component Value Date   HGBA1C 8.4 (H)  05/29/2020   HGBA1C 8.0 (A) 03/18/2020   HGBA1C 8.2 (A) 12/19/2019   ESRSEDRATE 51 (H) 12/24/2019   ESRSEDRATE 36 (H) 02/24/2017   CRP 13.2 (H) 12/24/2019   CRP 1.2 (H) 02/24/2017   LABURIC 5.5 09/01/2015   REPTSTATUS 06/03/2020 FINAL 05/29/2020   CULT  05/29/2020    NO GROWTH 5 DAYS Performed at [facility], [place] [address] [place], [place] [address]    LABORGA GROUP B STREP (S.AGALACTIAE) ISOLATED 12/26/2014     Lab Results  Component Value Date   ALBUMIN 3.4 (L) 05/29/2020   ALBUMIN 3.6 12/26/2019   ALBUMIN 4.0 12/19/2019   PREALBUMIN 19.4 03/05/2012    No results found for: MG No results found for: VD25OH  Lab Results  Component Value Date   PREALBUMIN 19.4 03/05/2012   CBC EXTENDED Latest Ref Rng & Units 07/02/2020 05/31/2020 05/30/2020  WBC 4.0 - 10.5 K/uL 11.7(H) 8.9 12.4(H)  RBC 3.87 - 5.11 MIL/uL 4.18 3.92 3.94  HGB 12.0 - 15.0 g/dL 11.1(L) 10.5(L) 10.7(L)  HCT 36.0 - 46.0 % 35.5(L) 33.5(L) 34.0(L)  PLT 150 - 400 K/uL 383 382 400  NEUTROABS 1.7 - 7.7 K/uL 7.8(H) - -  LYMPHSABS 0.7 - 4.0 K/uL 2.6 - -     Body mass index is 37.93 kg/m.  Orders:  No orders of the defined types were placed in this encounter.  No orders of the defined types were placed in this encounter.    Procedures: No procedures performed  Clinical Data: No additional findings.  ROS:  All other systems negative, except as noted in the HPI. Review of Systems  Objective: Vital Signs: Ht 5\' 6"  (1.676 m)   Wt 235 lb (106.6 kg)   BMI 37.93 kg/m   Specialty Comments:  No specialty comments available.  PMFS History: Patient Active Problem List   Diagnosis Date Noted   Memory loss 11/21/2019   Mild nonproliferative diabetic retinopathy of both eyes ([place]) 10/23/2019   Nuclear sclerotic cataract of both eyes 10/23/2019   Posterior vitreous detachment of right eye 10/23/2019   Retinal hemorrhage of left eye 10/23/2019   Hyperlipidemia associated with type 2 diabetes  mellitus ([place]) 11/30/2017   Impingement syndrome of right shoulder 07/06/2017   Iron deficiency anemia 05/11/2017   Morbid obesity ([place]) 04/24/2017   Osteomyelitis ([place])    Anemia 02/24/2017   Baker's cyst 07/10/2016   Onychomycosis 12/07/2015   Upper airway cough syndrome 03/05/2014   History of amputation of lesser toe of right foot ([place]) 04/15/2013   Hypertension associated with diabetes ([place]) 12/05/2006   Uncontrolled type 2 diabetes mellitus with peripheral circulatory disorder ([place]) 10/12/2006   Dyslipidemia associated with type 2 diabetes mellitus ([place]) 10/12/2006   Allergic rhinitis 10/12/2006   GERD - Followed by Dr. Fuller Plan 10/12/2006   IRRITABLE BOWEL SYNDROME - followed by Dr. Fuller Plan 10/12/2006   Peripheral neuropathy 10/11/2006   ALOPECIA NEC 10/11/2006   Past Medical History:  Diagnosis Date   Alopecia    Anemia, mild    Arthritis    Chronic cough    sees pulmonologist   Chronic pain    chest wall and abd - s/p extensive eval   Diabetes mellitus with neuropathy (HCC)    sees endocrine   Diverticulosis    Fatty liver    GERD (gastroesophageal reflux disease)    takes Nexium bid, hx erosive esophagitis   Headache(784.0)    occasionally;r/t sinus    History of colon polyps    HTN (hypertension)    Hx of amputation of lesser toe (HCC)    sees podiatrist   Hyperlipemia    IBS (irritable bowel syndrome)    Insomnia    takes Elavil nightly   Joint pain    Neuropathy    Osteomyelitis (HCC)    Pneumonia    PONV (postoperative nausea and vomiting)    Seasonal allergies    takes Zyrtec daily   Sinus tachycardia     Family History  Problem Relation Age of Onset   Lung cancer Mother 72       smoked heavily   Emphysema Mother    Hypertension Father    Hyperlipidemia Father    Diabetes Father    Coronary artery disease Father    Dementia Father    COPD Father        smoked   Stomach cancer Paternal Aunt    Brain cancer Paternal Uncle    Irritable bowel  syndrome Other        Several family members on fathers side    Diabetes Other    Stomach cancer Maternal Aunt    Lung cancer Paternal Uncle    Heart disease Paternal Uncle    Colon cancer Neg Hx     Past Surgical History:  Procedure Laterality Date   AMPUTATION  12/28/2011   Procedure: AMPUTATION DIGIT;  Surgeon: Newt Minion, MD;  Location: [facility];  Service: Orthopedics;  Laterality: Right;  Right Foot 2nd Toe Amputation at MTP (metatarsophalangeal joint)   AMPUTATION Right 04/25/2012   Procedure: Right Foot 3rd Toe Amputation;  Surgeon: Newt Minion, MD;  Location: [facility];  Service: Orthopedics;  Laterality: Right;  Right Foot Third Toe Amputation    AMPUTATION Right 07/27/2012   Procedure: Right 4th Toe Amputation at Metatarsophalangeal;  Surgeon: Newt Minion, MD;  Location: [facility];  Service: Orthopedics;  Laterality: Right;  Right 4th Toe Amputation at Metatarsophalangeal   AMPUTATION Left 07/15/2016   Procedure: Left 2nd Ray Amputation;  Surgeon: Newt Minion, MD;  Location: [facility];  Service: Orthopedics;  Laterality: Left;   AMPUTATION Left 11/18/2016   Procedure: Left 3rd and 4th Ray Amputation;  Surgeon: Newt Minion, MD;  Location: [facility];  Service: Orthopedics;  Laterality: Left;   AMPUTATION Right 03/29/2017   Procedure: RIGHT FOOT 3RD AND 4TH RAY AMPUTATION;  Surgeon: Newt Minion, MD;  Location: [facility];  Service: Orthopedics;  Laterality: Right;   COLONOSCOPY     LAPAROSCOPIC APPENDECTOMY  01/05/2011   Procedure: APPENDECTOMY LAPAROSCOPIC;  Surgeon: Pedro Earls, MD;  Location: [facility];  Service: General;  Laterality: N/A;   OOPHORECTOMY  2001   ROTATOR CUFF REPAIR Right    x 2   TUBAL LIGATION     VAGINAL HYSTERECTOMY  2001   Social History   Occupational History   Occupation: Retired   Tobacco Use   Smoking status: Never   Smokeless tobacco: Never  Vaping Use   Vaping Use: Never used  Substance and Sexual Activity   Alcohol use: No   Drug use: No   Sexual  activity: Yes    Birth control/protection: Surgical

## 2020-07-19 DIAGNOSIS — Z20822 Contact with and (suspected) exposure to covid-19: Secondary | ICD-10-CM | POA: Diagnosis not present

## 2020-07-22 ENCOUNTER — Other Ambulatory Visit: Payer: Self-pay

## 2020-07-22 ENCOUNTER — Telehealth (INDEPENDENT_AMBULATORY_CARE_PROVIDER_SITE_OTHER): Payer: Medicare Other | Admitting: Registered Nurse

## 2020-07-22 DIAGNOSIS — U071 COVID-19: Secondary | ICD-10-CM | POA: Diagnosis not present

## 2020-07-22 DIAGNOSIS — Z20822 Contact with and (suspected) exposure to covid-19: Secondary | ICD-10-CM | POA: Diagnosis not present

## 2020-07-22 MED ORDER — BENZONATATE 100 MG PO CAPS: 100.0000 mg | ORAL_CAPSULE | Freq: Two times a day (BID) | ORAL | 0 refills | Status: DC | PRN

## 2020-07-22 MED ORDER — DM-GUAIFENESIN ER 30-600 MG PO TB12: 1.0000 | ORAL_TABLET | Freq: Two times a day (BID) | ORAL | 0 refills | Status: DC

## 2020-07-22 MED ORDER — MOLNUPIRAVIR EUA 200MG CAPSULE: 4.0000 | ORAL_CAPSULE | Freq: Two times a day (BID) | ORAL | 0 refills | Status: AC

## 2020-07-22 NOTE — Progress Notes (Signed)
Telemedicine Encounter- SOAP NOTE Established Patient  This telephone encounter was conducted with the patient's (or proxy's) verbal consent via audio telecommunications: yes/no: Yes Patient was instructed to have this encounter in a suitably private space; and to only have persons present to whom they give permission to participate. In addition, patient identity was confirmed by use of name plus two identifiers (DOB and address).  I discussed the limitations, risks, security and privacy concerns of performing an evaluation and management service by telephone and the availability of in person appointments. I also discussed with the patient that there may be a patient responsible charge related to this service. The patient expressed understanding and agreed to proceed.  I spent a total of 15 minutes talking with the patient or their proxy.  Patient at home Provider in office  Participants: [name], NP and [name]  Chief Complaint  Patient presents with   Covid Positive    Patient states she tested positive for covid at home 07/21/2020. Patient has been experiencing cough, headaches, chills. She has been taking some tylenol that has been working.     Subjective   Beth Holden is a 75 y.o. established patient. Telephone visit today for COVID-19  HPI Home test positive yesterday - 07/21/20 - two home tests positive. Has been vaccinated x2, boosted x 1  Symptoms started late last week, but truly kicked off over weekend with coughing and headache Has been having some feverish and chills, no temp Some sinus congestion - but mild Some mild shob, mild wheezing when coughing  Taking tylenol for relief, good effect.   Patient Active Problem List   Diagnosis Date Noted   Memory loss 11/21/2019   Mild nonproliferative diabetic retinopathy of both eyes ([place]) 10/23/2019   Nuclear sclerotic cataract of both eyes 10/23/2019   Posterior vitreous detachment of right eye  10/23/2019   Retinal hemorrhage of left eye 10/23/2019   Hyperlipidemia associated with type 2 diabetes mellitus ([place]) 11/30/2017   Impingement syndrome of right shoulder 07/06/2017   Iron deficiency anemia 05/11/2017   Morbid obesity ([place]) 04/24/2017   Osteomyelitis ([place])    Anemia 02/24/2017   Baker's cyst 07/10/2016   Onychomycosis 12/07/2015   Upper airway cough syndrome 03/05/2014   History of amputation of lesser toe of right foot ([place]) 04/15/2013   Hypertension associated with diabetes ([place]) 12/05/2006   Uncontrolled type 2 diabetes mellitus with peripheral circulatory disorder ([place]) 10/12/2006   Dyslipidemia associated with type 2 diabetes mellitus ([place]) 10/12/2006   Allergic rhinitis 10/12/2006   GERD - Followed by Dr. Fuller Plan 10/12/2006   IRRITABLE BOWEL SYNDROME - followed by Dr. Fuller Plan 10/12/2006   Peripheral neuropathy 10/11/2006   ALOPECIA NEC 10/11/2006    Past Medical History:  Diagnosis Date   Alopecia    Anemia, mild    Arthritis    Chronic cough    sees pulmonologist   Chronic pain    chest wall and abd - s/p extensive eval   Diabetes mellitus with neuropathy (HCC)    sees endocrine   Diverticulosis    Fatty liver    GERD (gastroesophageal reflux disease)    takes Nexium bid, hx erosive esophagitis   Headache(784.0)    occasionally;r/t sinus    History of colon polyps    HTN (hypertension)    Hx of amputation of lesser toe (HCC)    sees podiatrist   Hyperlipemia    IBS (irritable bowel syndrome)    Insomnia    takes  Elavil nightly   Joint pain    Neuropathy    Osteomyelitis (HCC)    Pneumonia    PONV (postoperative nausea and vomiting)    Seasonal allergies    takes Zyrtec daily   Sinus tachycardia     Current Outpatient Medications  Medication Sig Dispense Refill   acetaminophen (TYLENOL) 500 MG tablet Take 500 mg by mouth every 8 (eight) hours as needed for mild pain.      benzonatate (TESSALON) 100 MG capsule Take 1 capsule (100 mg total)  by mouth 2 (two) times daily as needed for cough. 20 capsule 0   betamethasone dipropionate (DIPROLENE) 0.05 % cream Apply 1 application topically 2 (two) times daily as needed (IRRITATION). 30 g 0   Blood Glucose Monitoring Suppl (ONE TOUCH ULTRA 2) w/Device KIT Use to check blood sugar (Patient taking differently: 1 each by Other route daily.) 1 each 0   cholecalciferol (VITAMIN D) 1000 units tablet Take 1,000 Units by mouth daily.     dextromethorphan-guaiFENesin (MUCINEX DM) 30-600 MG 12hr tablet Take 1 tablet by mouth 2 (two) times daily. (Patient not taking: Reported on 08/06/2020) 20 tablet 0   dicyclomine (BENTYL) 10 MG capsule Take 1 capsule (10 mg total) by mouth 3 (three) times daily as needed for spasms. 90 capsule 5   fluconazole (DIFLUCAN) 150 MG tablet Take 1 tablet (150 mg total) by mouth once as needed for up to 1 dose (vaginal discharge/itching). (Patient not taking: Reported on 08/06/2020) 1 tablet 0   Fluocinolone Acetonide 0.01 % OIL Use 1-2 drops daily as needed. (Patient taking differently: Place 1-2 drops into both ears daily as needed (ear irritation).) 20 mL 0   gabapentin (NEURONTIN) 100 MG capsule TAKE 2 TO 3 CAPSULES AT BEDTIME (Patient taking differently: Take 200-300 mg by mouth at bedtime.) 180 capsule 3   glucagon (GLUCAGEN) 1 MG SOLR injection Inject 1 mg into the muscle once as needed for up to 1 dose for low blood sugar. 1 each 11   insulin NPH Human (NOVOLIN N RELION) 100 UNIT/ML injection Inject 0.15-0.2 mLs (15-20 Units total) into the skin See admin instructions. Inject under skin 12-15 units in am  and 10-15 units at bedtime per sliding scale (Patient taking differently: Inject 15-20 Units into the skin See admin instructions. Inject under skin 12-15 units in am  and 10-15 units at bedtime per sliding scale)     insulin regular (NOVOLIN R RELION) 100 units/mL injection Inject 0.2-0.3 mLs (20-30 Units total) into the skin 3 (three) times daily before meals. Per  sliding scale     Insulin Syringe-Needle U-100 (RELION INSULIN SYRINGE 1ML/31G) 31G X 5/16" 1 ML MISC USE 2 TIMES A DAY (Patient taking differently: 1 each by Other route 2 (two) times daily.) 100 each 2   irbesartan-hydrochlorothiazide (AVALIDE) 150-12.5 MG tablet Take 1 tablet by mouth daily. 90 tablet 3   metFORMIN (GLUCOPHAGE) 1000 MG tablet Take 1 tablet (1,000 mg total) by mouth 2 (two) times daily with a meal. 180 tablet 3   metoprolol succinate (TOPROL-XL) 25 MG 24 hr tablet Take 1 tablet (25 mg total) by mouth daily. 90 tablet 3   mupirocin ointment (BACTROBAN) 2 % Apply 1 application topically daily as needed (wound care).     omeprazole (PRILOSEC) 40 MG capsule Take 1 capsule (40 mg total) by mouth 2 (two) times daily. 180 capsule 3   ONETOUCH ULTRA test strip USE  STRIP TO CHECK GLUCOSE THREE TIMES DAILY 300 each  0   OVER THE COUNTER MEDICATION Apply 1-3 application topically at bedtime as needed (foot pain). TOPRICIN FOOT CREAM - NEUROPATHY FOOT CREAM **APPLIES TO BOTH FEET AT BEDTIME**     Probiotic Product (PROBIOTIC DAILY PO) Take 1 capsule by mouth daily.     promethazine-dextromethorphan (PROMETHAZINE-DM) 6.25-15 MG/5ML syrup Take 2.5 mLs by mouth 4 (four) times daily as needed for cough. (Patient not taking: Reported on 08/06/2020) 118 mL 0   rosuvastatin (CRESTOR) 20 MG tablet Take 1 tablet (20 mg total) by mouth daily. 90 tablet 3   silver sulfADIAZINE (SILVADENE) 1 % cream Apply 1 application topically daily. (Patient not taking: Reported on 08/06/2020) 50 g 1   vitamin B-12 (CYANOCOBALAMIN) 1000 MCG tablet Take 1,000 mcg daily by mouth.     amoxicillin-clavulanate (AUGMENTIN) 875-125 MG tablet Take 1 tablet by mouth every 12 (twelve) hours. 10 tablet 0   azelastine (ASTELIN) 0.1 % nasal spray Place 2 sprays into both nostrils 2 (two) times daily. (Patient taking differently: Place 2 sprays into both nostrils 2 (two) times daily as needed for rhinitis or allergies.) 30 mL 12    cetirizine (ZYRTEC) 10 MG tablet Take 1 tablet (10 mg total) by mouth daily. 90 tablet 0   dapagliflozin propanediol (FARXIGA) 5 MG TABS tablet Take 1 tablet (5 mg total) by mouth daily before breakfast. 30 tablet 11   sulfamethoxazole-trimethoprim (BACTRIM DS) 800-160 MG tablet Take 1 tablet by mouth 2 (two) times daily. 20 tablet 0   vitamin C (ASCORBIC ACID) 500 MG tablet Take 500 mg by mouth daily.     No current facility-administered medications for this visit.    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   Codeine Other (See Comments)    Makes her crazy   Propoxyphene Hcl Itching    *DARVOCET     Social History   Socioeconomic History   Marital status: Married    Spouse name: Not on file   Number of children: 2   Years of education: Not on file   Highest education level: Not on file  Occupational History   Occupation: Retired   Tobacco Use   Smoking status: Never   Smokeless tobacco: Never  Vaping Use   Vaping Use: Never used  Substance and Sexual Activity   Alcohol use: No   Drug use: No   Sexual activity: Yes    Birth control/protection: Surgical  Other Topics Concern   Not on file  Social History Narrative   Caffeine daily    [employer], [employer] no diploma   Married '66   1 dtr- '78; 1 son '71; 2 grandchildren   Occupation: retired 04   Dad with alzheimers-had to place in [place] (summer '10)         Social Determinants of Radio broadcast assistant Strain: Not on file  Food Insecurity: Not on file  Transportation Needs: Not on file  Physical Activity: Not on file  Stress: Not on file  Social Connections: Not on file  Intimate Partner Violence: Not on file    Review of Systems  Constitutional:  Positive for chills and malaise/fatigue.  HENT: Negative.    Eyes: Negative.   Respiratory: Negative.    Cardiovascular: Negative.   Gastrointestinal: Negative.   Genitourinary: Negative.   Musculoskeletal:  Negative.   Skin: Negative.   Neurological: Negative.   Endo/Heme/Allergies: Negative.   Psychiatric/Behavioral: Negative.    All other systems reviewed  and are negative.  Objective   Vitals as reported by the patient: There were no vitals filed for this visit.  Daneisha was seen today for covid positive.  Diagnoses and all orders for this visit:  COVID-19 -     molnupiravir EUA 200 mg CAPS; Take 4 capsules (800 mg total) by mouth 2 (two) times daily for 5 days. -     dextromethorphan-guaiFENesin (MUCINEX DM) 30-600 MG 12hr tablet; Take 1 tablet by mouth 2 (two) times daily. (Patient not taking: Reported on 08/06/2020) -     benzonatate (TESSALON) 100 MG capsule; Take 1 capsule (100 mg total) by mouth 2 (two) times daily as needed for cough.  PLAN Discussed risks, benefits, and side effects of antivirals. Will proceed as above.  Pt to isolate for 5 days from symptom onset and then mask and distance as possible for 5 additional days. Per cdc guidelines she will no longer be contagious as of 07/31/20 at the latest. She is fit to participate in all planned medical procedures after that time so long as her symptoms have continued to improve and she remains afebrile Mucinex dm and tessalon for symptom relief ER precautions reviewed with patient who voices understanding Patient encouraged to call clinic with any questions, comments, or concerns.   I discussed the assessment and treatment plan with the patient. The patient was provided an opportunity to ask questions and all were answered. The patient agreed with the plan and demonstrated an understanding of the instructions.   The patient was advised to call back or seek an in-person evaluation if the symptoms worsen or if the condition fails to improve as anticipated.  I provided 16 minutes of non-face-to-face time during this encounter.  Maximiano Coss, NP  Primary Care at [facility]

## 2020-07-22 NOTE — Patient Instructions (Signed)
° ° ° °  If you have lab work done today you will be contacted with your lab results within the next 2 weeks.  If you have not heard from us then please contact us. The fastest way to get your results is to register for My Chart. ° ° °IF you received an x-ray today, you will receive an invoice from [facility]. Please contact [facility] at [phone] with questions or concerns regarding your invoice.  ° °IF you received labwork today, you will receive an invoice from [facility]. Please contact [facility] at [phone] with questions or concerns regarding your invoice.  ° °Our billing staff will not be able to assist you with questions regarding bills from these companies. ° °You will be contacted with the lab results as soon as they are available. The fastest way to get your results is to activate your My Chart account. Instructions are located on the last page of this paperwork. If you have not heard from us regarding the results in 2 weeks, please contact this office. °  ° ° ° °

## 2020-07-27 ENCOUNTER — Other Ambulatory Visit: Payer: Self-pay | Admitting: Family Medicine

## 2020-07-27 MED ORDER — CETIRIZINE HCL 10 MG PO TABS: 10.0000 mg | ORAL_TABLET | Freq: Every day | ORAL | 0 refills | Status: DC

## 2020-08-04 ENCOUNTER — Ambulatory Visit: Payer: Medicare Other | Admitting: Orthopedic Surgery

## 2020-08-06 ENCOUNTER — Other Ambulatory Visit: Payer: Self-pay

## 2020-08-06 ENCOUNTER — Encounter: Payer: Self-pay | Admitting: Orthopedic Surgery

## 2020-08-06 ENCOUNTER — Ambulatory Visit (INDEPENDENT_AMBULATORY_CARE_PROVIDER_SITE_OTHER): Payer: Medicare Other

## 2020-08-06 ENCOUNTER — Ambulatory Visit (INDEPENDENT_AMBULATORY_CARE_PROVIDER_SITE_OTHER): Payer: Medicare Other | Admitting: Orthopedic Surgery

## 2020-08-06 DIAGNOSIS — M79672 Pain in left foot: Secondary | ICD-10-CM | POA: Diagnosis not present

## 2020-08-06 DIAGNOSIS — M86272 Subacute osteomyelitis, left ankle and foot: Secondary | ICD-10-CM

## 2020-08-06 MED ORDER — SULFAMETHOXAZOLE-TRIMETHOPRIM 800-160 MG PO TABS: 1.0000 | ORAL_TABLET | Freq: Two times a day (BID) | ORAL | 0 refills | Status: DC

## 2020-08-06 NOTE — Progress Notes (Signed)
Office Visit Note   [name]           Date of Birth: 27-Jul-1945           MRN: [id number] Visit Date: 08/06/2020              Requested by: Vivi Barrack, MD [address] [place],  [place] [address] PCP: Vivi Barrack, MD  Chief Complaint  Patient presents with   Left Foot - Follow-up      HPI: Patient is a 75 year old woman who is seen in follow-up for Charcot left foot with a ulcer beneath the Charcot rocker-bottom deformity as well as a new ulcer beneath the third and fourth metatarsal heads.  Patient states she has noticed acute cellulitis over this area.  Assessment & Plan: Visit Diagnoses:  1. Pain in left foot   2. Subacute osteomyelitis, left ankle and foot ([place])     Plan: With the subacute osteomyelitis of the third and fourth metatarsals and previous toe amputations have discussed with the patient her best option is to proceed with a transmetatarsal amputation we will plan for 23-hour observation she is called in a prescription for Bactrim DS at this time she did have a allergic reaction previously that may have been due to doxycycline.  Follow-Up Instructions: Return in about 2 weeks (around 08/20/2020).   Ortho Exam  Patient is alert, oriented, no adenopathy, well-dressed, normal affect, normal respiratory effort. Examination patient has cellulitis over the distal dorsum of the foot.  She has a good dorsalis pedis and posterior tibial pulse.  After informed consent.  The ulcer was debrided of skin and soft tissue back to healthy viable tissue the ulcer is 2 cm after debridement with probing the ulcer probes down to the fourth and third metatarsals consistent with osteomyelitis.  The wound is 1 cm deep.  There is no ascending cellulitis.  The callus beneath the Charcot rocker-bottom deformity was debrided and the ulcer at this location has completely healed.  Imaging: XR Foot Complete Left  Result Date: 08/06/2020 Three-view radiographs of the left  foot shows destructive bony changes of the third and fourth metatarsal consistent with subacute osteomyelitis.  No images are attached to the encounter.  Labs: Lab Results  Component Value Date   HGBA1C 8.4 (H) 05/29/2020   HGBA1C 8.0 (A) 03/18/2020   HGBA1C 8.2 (A) 12/19/2019   ESRSEDRATE 51 (H) 12/24/2019   ESRSEDRATE 36 (H) 02/24/2017   CRP 13.2 (H) 12/24/2019   CRP 1.2 (H) 02/24/2017   LABURIC 5.5 09/01/2015   REPTSTATUS 06/03/2020 FINAL 05/29/2020   CULT  05/29/2020    NO GROWTH 5 DAYS Performed at [facility], [place] [address] [place], [place] [address]    LABORGA GROUP B STREP (S.AGALACTIAE) ISOLATED 12/26/2014     Lab Results  Component Value Date   ALBUMIN 3.4 (L) 05/29/2020   ALBUMIN 3.6 12/26/2019   ALBUMIN 4.0 12/19/2019   PREALBUMIN 19.4 03/05/2012    No results found for: MG No results found for: VD25OH  Lab Results  Component Value Date   PREALBUMIN 19.4 03/05/2012   CBC EXTENDED Latest Ref Rng & Units 07/02/2020 05/31/2020 05/30/2020  WBC 4.0 - 10.5 K/uL 11.7(H) 8.9 12.4(H)  RBC 3.87 - 5.11 MIL/uL 4.18 3.92 3.94  HGB 12.0 - 15.0 g/dL 11.1(L) 10.5(L) 10.7(L)  HCT 36.0 - 46.0 % 35.5(L) 33.5(L) 34.0(L)  PLT 150 - 400 K/uL 383 382 400  NEUTROABS 1.7 - 7.7 K/uL 7.8(H) - -  LYMPHSABS 0.7 - 4.0 K/uL 2.6 - -     There is no height or weight on file to calculate BMI.  Orders:  Orders Placed This Encounter  Procedures   XR Foot Complete Left   No orders of the defined types were placed in this encounter.    Procedures: No procedures performed  Clinical Data: No additional findings.  ROS:  All other systems negative, except as noted in the HPI. Review of Systems  Objective: Vital Signs: There were no vitals taken for this visit.  Specialty Comments:  No specialty comments available.  PMFS History: Patient Active Problem List   Diagnosis Date Noted   Memory loss 11/21/2019   Mild nonproliferative diabetic retinopathy of both eyes  ([place]) 10/23/2019   Nuclear sclerotic cataract of both eyes 10/23/2019   Posterior vitreous detachment of right eye 10/23/2019   Retinal hemorrhage of left eye 10/23/2019   Hyperlipidemia associated with type 2 diabetes mellitus ([place]) 11/30/2017   Impingement syndrome of right shoulder 07/06/2017   Iron deficiency anemia 05/11/2017   Morbid obesity ([place]) 04/24/2017   Osteomyelitis ([place])    Anemia 02/24/2017   Baker's cyst 07/10/2016   Onychomycosis 12/07/2015   Upper airway cough syndrome 03/05/2014   History of amputation of lesser toe of right foot ([place]) 04/15/2013   Hypertension associated with diabetes ([place]) 12/05/2006   Uncontrolled type 2 diabetes mellitus with peripheral circulatory disorder ([place]) 10/12/2006   Dyslipidemia associated with type 2 diabetes mellitus ([place]) 10/12/2006   Allergic rhinitis 10/12/2006   GERD - Followed by Dr. Fuller Plan 10/12/2006   IRRITABLE BOWEL SYNDROME - followed by Dr. Fuller Plan 10/12/2006   Peripheral neuropathy 10/11/2006   ALOPECIA NEC 10/11/2006   Past Medical History:  Diagnosis Date   Alopecia    Anemia, mild    Arthritis    Chronic cough    sees pulmonologist   Chronic pain    chest wall and abd - s/p extensive eval   Diabetes mellitus with neuropathy (HCC)    sees endocrine   Diverticulosis    Fatty liver    GERD (gastroesophageal reflux disease)    takes Nexium bid, hx erosive esophagitis   Headache(784.0)    occasionally;r/t sinus    History of colon polyps    HTN (hypertension)    Hx of amputation of lesser toe (HCC)    sees podiatrist   Hyperlipemia    IBS (irritable bowel syndrome)    Insomnia    takes Elavil nightly   Joint pain    Neuropathy    Osteomyelitis (HCC)    Pneumonia    PONV (postoperative nausea and vomiting)    Seasonal allergies    takes Zyrtec daily   Sinus tachycardia     Family History  Problem Relation Age of Onset   Lung cancer Mother 32       smoked heavily   Emphysema Mother    Hypertension  Father    Hyperlipidemia Father    Diabetes Father    Coronary artery disease Father    Dementia Father    COPD Father        smoked   Stomach cancer Paternal Aunt    Brain cancer Paternal Uncle    Irritable bowel syndrome Other        Several family members on fathers side    Diabetes Other    Stomach cancer Maternal Aunt    Lung cancer Paternal Uncle    Heart disease Paternal Uncle  Colon cancer Neg Hx     Past Surgical History:  Procedure Laterality Date   AMPUTATION  12/28/2011   Procedure: AMPUTATION DIGIT;  Surgeon: Newt Minion, MD;  Location: [place];  Service: Orthopedics;  Laterality: Right;  Right Foot 2nd Toe Amputation at MTP (metatarsophalangeal joint)   AMPUTATION Right 04/25/2012   Procedure: Right Foot 3rd Toe Amputation;  Surgeon: Newt Minion, MD;  Location: [place];  Service: Orthopedics;  Laterality: Right;  Right Foot Third Toe Amputation    AMPUTATION Right 07/27/2012   Procedure: Right 4th Toe Amputation at Metatarsophalangeal;  Surgeon: Newt Minion, MD;  Location: [facility];  Service: Orthopedics;  Laterality: Right;  Right 4th Toe Amputation at Metatarsophalangeal   AMPUTATION Left 07/15/2016   Procedure: Left 2nd Ray Amputation;  Surgeon: Newt Minion, MD;  Location: [facility];  Service: Orthopedics;  Laterality: Left;   AMPUTATION Left 11/18/2016   Procedure: Left 3rd and 4th Ray Amputation;  Surgeon: Newt Minion, MD;  Location: [facility];  Service: Orthopedics;  Laterality: Left;   AMPUTATION Right 03/29/2017   Procedure: RIGHT FOOT 3RD AND 4TH RAY AMPUTATION;  Surgeon: Newt Minion, MD;  Location: [facility];  Service: Orthopedics;  Laterality: Right;   COLONOSCOPY     LAPAROSCOPIC APPENDECTOMY  01/05/2011   Procedure: APPENDECTOMY LAPAROSCOPIC;  Surgeon: Pedro Earls, MD;  Location[facility];  Service: General;  Laterality: N/A;   OOPHORECTOMY  2001   ROTATOR CUFF REPAIR Right    x 2   TUBAL LIGATION     VAGINAL HYSTERECTOMY  2001   Social History    Occupational History   Occupation: Retired   Tobacco Use   Smoking status: Never   Smokeless tobacco: Never  Vaping Use   Vaping Use: Never used  Substance and Sexual Activity   Alcohol use: No   Drug use: No   Sexual activity: Yes    Birth control/protection: Surgical

## 2020-08-09 ENCOUNTER — Other Ambulatory Visit: Payer: Self-pay | Admitting: Physician Assistant

## 2020-08-11 ENCOUNTER — Encounter (HOSPITAL_COMMUNITY): Payer: Self-pay | Admitting: Orthopedic Surgery

## 2020-08-11 ENCOUNTER — Other Ambulatory Visit: Payer: Self-pay

## 2020-08-11 NOTE — Progress Notes (Addendum)
Beth Holden denies chest pain or shortness of breath. Patient denies having any s/s of Covid in her household.  Patient denies any known exposure to Covid. Patient was positive for Covid on 07/21/20.    Beth Holden has type II diabetes, patient reported that CBGs run 150's, drops to 50's sometime.  I instructed patient to take 1/2 of NPH tonight.  DO not take any pills for diabetes  in am. In am if CBG is greater than 70 take 1/2 of NPH dose. I instructed patient to check CBG after awaking and every 2 hours until arrival  to the hospital.  I Instructed patient if CBG is less than 70 to take 4 Glucose Tablets or 1 tube of Glucose Gel or 1/2 cup of a clear juice. Recheck CBG in 15 minutes if CBG is not over 70 call, pre- op desk at [phone] for further instructions. If scheduled to receive Insulin, do not take Insulin  Patient asked, what she should do if CBG drops in the middle of the night.I instructed patient to drink 4 ounces of clear juice- apple, check it 15 minutes later, if CBG is not above 70, drink additional 4 ounces of Apple juice. I instructed patient that she may have clear liquids until 1000. I instructed patient that if CBG is less than 70 after drinking 4 ounces of juice to call pre-op desk. Beth Holden took notes and went over instructions with me.  .lotion

## 2020-08-12 ENCOUNTER — Observation Stay (HOSPITAL_COMMUNITY)
Admission: RE | Admit: 2020-08-12 | Discharge: 2020-08-13 | Disposition: A | Discharge status: Home / Self Care | Payer: Medicare Other | Attending: Orthopedic Surgery | Admitting: Orthopedic Surgery

## 2020-08-12 ENCOUNTER — Ambulatory Visit (HOSPITAL_COMMUNITY): Payer: Medicare Other | Admitting: Anesthesiology

## 2020-08-12 ENCOUNTER — Encounter (HOSPITAL_COMMUNITY): Payer: Self-pay | Admitting: Orthopedic Surgery

## 2020-08-12 ENCOUNTER — Encounter (HOSPITAL_COMMUNITY)
Admission: RE | Disposition: A | Discharge status: Home / Self Care | Payer: Self-pay | Source: Home / Self Care | Attending: Orthopedic Surgery

## 2020-08-12 ENCOUNTER — Other Ambulatory Visit: Payer: Self-pay

## 2020-08-12 DIAGNOSIS — Z794 Long term (current) use of insulin: Secondary | ICD-10-CM | POA: Diagnosis not present

## 2020-08-12 DIAGNOSIS — E1159 Type 2 diabetes mellitus with other circulatory complications: Secondary | ICD-10-CM | POA: Diagnosis not present

## 2020-08-12 DIAGNOSIS — Z7984 Long term (current) use of oral hypoglycemic drugs: Secondary | ICD-10-CM | POA: Diagnosis not present

## 2020-08-12 DIAGNOSIS — E119 Type 2 diabetes mellitus without complications: Secondary | ICD-10-CM | POA: Diagnosis not present

## 2020-08-12 DIAGNOSIS — M6281 Muscle weakness (generalized): Secondary | ICD-10-CM | POA: Diagnosis not present

## 2020-08-12 DIAGNOSIS — G8918 Other acute postprocedural pain: Secondary | ICD-10-CM | POA: Diagnosis not present

## 2020-08-12 DIAGNOSIS — L02612 Cutaneous abscess of left foot: Secondary | ICD-10-CM | POA: Insufficient documentation

## 2020-08-12 DIAGNOSIS — M869 Osteomyelitis, unspecified: Secondary | ICD-10-CM | POA: Diagnosis not present

## 2020-08-12 DIAGNOSIS — U071 COVID-19: Secondary | ICD-10-CM | POA: Diagnosis not present

## 2020-08-12 DIAGNOSIS — I10 Essential (primary) hypertension: Secondary | ICD-10-CM | POA: Diagnosis not present

## 2020-08-12 DIAGNOSIS — M86272 Subacute osteomyelitis, left ankle and foot: Principal | ICD-10-CM | POA: Insufficient documentation

## 2020-08-12 HISTORY — PX: AMPUTATION: SHX166

## 2020-08-12 HISTORY — DX: Dyspnea, unspecified: R06.00

## 2020-08-12 HISTORY — PX: APPLICATION OF WOUND VAC: SHX5189

## 2020-08-12 LAB — CBC
HCT: 34.4 % — ABNORMAL LOW (ref 36.0–46.0)
Hemoglobin: 10.8 g/dL — ABNORMAL LOW (ref 12.0–15.0)
MCH: 26.9 pg (ref 26.0–34.0)
MCHC: 31.4 g/dL (ref 30.0–36.0)
MCV: 85.6 fL (ref 80.0–100.0)
Platelets: 403 10*3/uL — ABNORMAL HIGH (ref 150–400)
RBC: 4.02 MIL/uL (ref 3.87–5.11)
RDW: 14 % (ref 11.5–15.5)
WBC: 9.8 10*3/uL (ref 4.0–10.5)
nRBC: 0 % (ref 0.0–0.2)

## 2020-08-12 LAB — BASIC METABOLIC PANEL
Anion gap: 8 (ref 5–15)
BUN: 18 mg/dL (ref 8–23)
CO2: 27 mmol/L (ref 22–32)
Calcium: 9.6 mg/dL (ref 8.9–10.3)
Chloride: 101 mmol/L (ref 98–111)
Creatinine, Ser: 1.17 mg/dL — ABNORMAL HIGH (ref 0.44–1.00)
GFR, Estimated: 50 mL/min — ABNORMAL LOW (ref >60)
Glucose, Bld: 190 mg/dL — ABNORMAL HIGH (ref 70–99)
Potassium: 4.6 mmol/L (ref 3.5–5.1)
Sodium: 136 mmol/L (ref 135–145)

## 2020-08-12 LAB — GLUCOSE, CAPILLARY
Glucose-Capillary: 194 mg/dL — ABNORMAL HIGH (ref 70–99)
Glucose-Capillary: 157 mg/dL — ABNORMAL HIGH (ref 70–99)
Glucose-Capillary: 144 mg/dL — ABNORMAL HIGH (ref 70–99)
Glucose-Capillary: 171 mg/dL — ABNORMAL HIGH (ref 70–99)

## 2020-08-12 LAB — SARS CORONAVIRUS 2 BY RT PCR (HOSPITAL ORDER, PERFORMED IN ~~LOC~~ HOSPITAL LAB): SARS Coronavirus 2: POSITIVE — AB

## 2020-08-12 SURGERY — AMPUTATION, FOOT, RAY
Anesthesia: General | Site: Foot | Laterality: Left

## 2020-08-12 MED ORDER — ASCORBIC ACID 500 MG PO TABS
500.0000 mg | ORAL_TABLET | Freq: Every day | ORAL | Status: DC
  Administered 2020-08-13: 500 mg via ORAL
  Filled 2020-08-12: qty 1

## 2020-08-12 MED ORDER — JUVEN PO PACK
1.0000 | PACK | Freq: Two times a day (BID) | ORAL | Status: DC
  Administered 2020-08-13: 1 via ORAL
  Filled 2020-08-12: qty 1

## 2020-08-12 MED ORDER — INSULIN ASPART 100 UNIT/ML IJ SOLN: 0.0000 [IU] | Freq: Three times a day (TID) | INTRAMUSCULAR | Status: DC

## 2020-08-12 MED ORDER — ONDANSETRON HCL 4 MG/2ML IJ SOLN: 4.0000 mg | Freq: Four times a day (QID) | INTRAMUSCULAR | Status: DC | PRN

## 2020-08-12 MED ORDER — MIDAZOLAM HCL 2 MG/2ML IJ SOLN
INTRAMUSCULAR | Status: AC
  Administered 2020-08-12: 1 mg via INTRAVENOUS
  Filled 2020-08-12: qty 2

## 2020-08-12 MED ORDER — ORAL CARE MOUTH RINSE: 15.0000 mL | Freq: Once | OROMUCOSAL | Status: AC

## 2020-08-12 MED ORDER — MIDAZOLAM HCL 2 MG/2ML IJ SOLN
INTRAMUSCULAR | Status: AC
  Filled 2020-08-12: qty 2

## 2020-08-12 MED ORDER — VITAMIN D 25 MCG (1000 UNIT) PO TABS
1000.0000 [IU] | ORAL_TABLET | Freq: Every day | ORAL | Status: DC
  Administered 2020-08-13: 1000 [IU] via ORAL
  Filled 2020-08-12: qty 1

## 2020-08-12 MED ORDER — METOPROLOL SUCCINATE ER 25 MG PO TB24
25.0000 mg | ORAL_TABLET | Freq: Every day | ORAL | Status: DC
  Administered 2020-08-13: 25 mg via ORAL
  Filled 2020-08-12: qty 1

## 2020-08-12 MED ORDER — METOCLOPRAMIDE HCL 5 MG/ML IJ SOLN: 5.0000 mg | Freq: Three times a day (TID) | INTRAMUSCULAR | Status: DC | PRN

## 2020-08-12 MED ORDER — FENTANYL CITRATE (PF) 100 MCG/2ML IJ SOLN: 25.0000 ug | INTRAMUSCULAR | Status: DC | PRN

## 2020-08-12 MED ORDER — GABAPENTIN 100 MG PO CAPS
200.0000 mg | ORAL_CAPSULE | Freq: Every day | ORAL | Status: DC
  Administered 2020-08-12: 200 mg via ORAL
  Filled 2020-08-12: qty 2

## 2020-08-12 MED ORDER — BUPIVACAINE HCL (PF) 0.5 % IJ SOLN
INTRAMUSCULAR | Status: DC | PRN
  Administered 2020-08-12: 30 mL via PERINEURAL

## 2020-08-12 MED ORDER — OXYCODONE HCL 5 MG PO TABS
5.0000 mg | ORAL_TABLET | ORAL | Status: DC | PRN
  Administered 2020-08-12: 5 mg via ORAL
  Administered 2020-08-13: 10 mg via ORAL
  Administered 2020-08-13 (×2): 5 mg via ORAL
  Filled 2020-08-12: qty 2
  Filled 2020-08-12 (×3): qty 1

## 2020-08-12 MED ORDER — LORATADINE 10 MG PO TABS
10.0000 mg | ORAL_TABLET | Freq: Every day | ORAL | Status: DC
  Administered 2020-08-13: 10 mg via ORAL
  Filled 2020-08-12: qty 1

## 2020-08-12 MED ORDER — CHLORHEXIDINE GLUCONATE 0.12 % MT SOLN
15.0000 mL | Freq: Once | OROMUCOSAL | Status: AC
  Administered 2020-08-12: 15 mL via OROMUCOSAL
  Filled 2020-08-12: qty 15

## 2020-08-12 MED ORDER — OXYCODONE HCL 5 MG PO TABS: 5.0000 mg | ORAL_TABLET | Freq: Once | ORAL | Status: DC | PRN

## 2020-08-12 MED ORDER — FENTANYL CITRATE (PF) 100 MCG/2ML IJ SOLN
INTRAMUSCULAR | Status: DC | PRN
  Administered 2020-08-12: 50 ug via INTRAVENOUS

## 2020-08-12 MED ORDER — INSULIN ASPART 100 UNIT/ML IJ SOLN
0.0000 [IU] | Freq: Three times a day (TID) | INTRAMUSCULAR | Status: DC
  Administered 2020-08-12: 3 [IU] via SUBCUTANEOUS
  Administered 2020-08-13 (×2): 5 [IU] via SUBCUTANEOUS

## 2020-08-12 MED ORDER — FENTANYL CITRATE (PF) 250 MCG/5ML IJ SOLN
INTRAMUSCULAR | Status: AC
  Filled 2020-08-12: qty 5

## 2020-08-12 MED ORDER — ACETAMINOPHEN 500 MG PO TABS
1000.0000 mg | ORAL_TABLET | Freq: Once | ORAL | Status: AC
  Administered 2020-08-12: 1000 mg via ORAL
  Filled 2020-08-12: qty 2

## 2020-08-12 MED ORDER — LACTATED RINGERS IV SOLN: INTRAVENOUS | Status: DC

## 2020-08-12 MED ORDER — IRBESARTAN 150 MG PO TABS
150.0000 mg | ORAL_TABLET | Freq: Every day | ORAL | Status: DC
  Administered 2020-08-12 – 2020-08-13 (×2): 150 mg via ORAL
  Filled 2020-08-12 (×2): qty 1

## 2020-08-12 MED ORDER — CEFAZOLIN SODIUM-DEXTROSE 2-4 GM/100ML-% IV SOLN
2.0000 g | INTRAVENOUS | Status: AC
  Administered 2020-08-12: 2 g via INTRAVENOUS
  Filled 2020-08-12: qty 100

## 2020-08-12 MED ORDER — BENZONATATE 100 MG PO CAPS: 100.0000 mg | ORAL_CAPSULE | Freq: Two times a day (BID) | ORAL | Status: DC | PRN

## 2020-08-12 MED ORDER — PROPOFOL 10 MG/ML IV BOLUS
INTRAVENOUS | Status: DC | PRN
  Administered 2020-08-12: 50 mg via INTRAVENOUS
  Administered 2020-08-12 (×2): 20 mg via INTRAVENOUS

## 2020-08-12 MED ORDER — FLUOCINOLONE ACETONIDE 0.01 % OT OIL: 1.0000 [drp] | TOPICAL_OIL | Freq: Every day | OTIC | Status: DC | PRN

## 2020-08-12 MED ORDER — IRBESARTAN-HYDROCHLOROTHIAZIDE 150-12.5 MG PO TABS: 1.0000 | ORAL_TABLET | Freq: Every day | ORAL | Status: DC

## 2020-08-12 MED ORDER — SODIUM CHLORIDE 0.9 % IV SOLN: INTRAVENOUS | Status: DC

## 2020-08-12 MED ORDER — METFORMIN HCL 500 MG PO TABS: 1000.0000 mg | ORAL_TABLET | Freq: Two times a day (BID) | ORAL | Status: DC

## 2020-08-12 MED ORDER — DICYCLOMINE HCL 10 MG PO CAPS: 10.0000 mg | ORAL_CAPSULE | Freq: Three times a day (TID) | ORAL | Status: DC | PRN

## 2020-08-12 MED ORDER — VITAMIN B-12 1000 MCG PO TABS
1000.0000 ug | ORAL_TABLET | Freq: Every day | ORAL | Status: DC
  Administered 2020-08-13: 1000 ug via ORAL
  Filled 2020-08-12: qty 1

## 2020-08-12 MED ORDER — ROSUVASTATIN CALCIUM 20 MG PO TABS
20.0000 mg | ORAL_TABLET | Freq: Every day | ORAL | Status: DC
  Administered 2020-08-13: 20 mg via ORAL
  Filled 2020-08-12: qty 1

## 2020-08-12 MED ORDER — HYDROCHLOROTHIAZIDE 12.5 MG PO CAPS
12.5000 mg | ORAL_CAPSULE | Freq: Every day | ORAL | Status: DC
  Administered 2020-08-12 – 2020-08-13 (×2): 12.5 mg via ORAL
  Filled 2020-08-12 (×2): qty 1

## 2020-08-12 MED ORDER — DOCUSATE SODIUM 100 MG PO CAPS
100.0000 mg | ORAL_CAPSULE | Freq: Two times a day (BID) | ORAL | Status: DC
  Administered 2020-08-12 – 2020-08-13 (×2): 100 mg via ORAL
  Filled 2020-08-12 (×2): qty 1

## 2020-08-12 MED ORDER — ZINC SULFATE 220 (50 ZN) MG PO CAPS
220.0000 mg | ORAL_CAPSULE | Freq: Every day | ORAL | Status: DC
Start: 2020-08-12 — End: 2020-08-13
  Administered 2020-08-13: 220 mg via ORAL
  Filled 2020-08-12: qty 1

## 2020-08-12 MED ORDER — CEFAZOLIN SODIUM-DEXTROSE 1-4 GM/50ML-% IV SOLN
1.0000 g | Freq: Four times a day (QID) | INTRAVENOUS | Status: AC
  Administered 2020-08-12 – 2020-08-13 (×3): 1 g via INTRAVENOUS
  Filled 2020-08-12 (×3): qty 50

## 2020-08-12 MED ORDER — 0.9 % SODIUM CHLORIDE (POUR BTL) OPTIME
TOPICAL | Status: DC | PRN
  Administered 2020-08-12: 1000 mL

## 2020-08-12 MED ORDER — HYDROMORPHONE HCL 1 MG/ML IJ SOLN
0.5000 mg | INTRAMUSCULAR | Status: DC | PRN
Start: 2020-08-12 — End: 2020-08-13
  Administered 2020-08-12: 0.5 mg via INTRAVENOUS
  Filled 2020-08-12: qty 0.5

## 2020-08-12 MED ORDER — INSULIN ASPART 100 UNIT/ML IJ SOLN: 4.0000 [IU] | Freq: Three times a day (TID) | INTRAMUSCULAR | Status: DC

## 2020-08-12 MED ORDER — ONDANSETRON HCL 4 MG/2ML IJ SOLN
INTRAMUSCULAR | Status: AC
  Filled 2020-08-12: qty 2

## 2020-08-12 MED ORDER — PANTOPRAZOLE SODIUM 40 MG PO TBEC
40.0000 mg | DELAYED_RELEASE_TABLET | Freq: Every day | ORAL | Status: DC
  Administered 2020-08-13: 40 mg via ORAL
  Filled 2020-08-12: qty 1

## 2020-08-12 MED ORDER — METOCLOPRAMIDE HCL 5 MG PO TABS: 5.0000 mg | ORAL_TABLET | Freq: Three times a day (TID) | ORAL | Status: DC | PRN

## 2020-08-12 MED ORDER — OXYCODONE HCL 5 MG/5ML PO SOLN: 5.0000 mg | Freq: Once | ORAL | Status: DC | PRN

## 2020-08-12 MED ORDER — MIDAZOLAM HCL 2 MG/2ML IJ SOLN: 1.0000 mg | Freq: Once | INTRAMUSCULAR | Status: AC

## 2020-08-12 MED ORDER — INSULIN ASPART 100 UNIT/ML IJ SOLN
4.0000 [IU] | Freq: Three times a day (TID) | INTRAMUSCULAR | Status: DC
  Administered 2020-08-12 – 2020-08-13 (×3): 4 [IU] via SUBCUTANEOUS

## 2020-08-12 MED ORDER — ACETAMINOPHEN 325 MG PO TABS: 325.0000 mg | ORAL_TABLET | Freq: Four times a day (QID) | ORAL | Status: DC | PRN

## 2020-08-12 MED ORDER — FENTANYL CITRATE (PF) 100 MCG/2ML IJ SOLN
INTRAMUSCULAR | Status: AC
  Administered 2020-08-12: 100 ug via INTRAVENOUS
  Filled 2020-08-12: qty 2

## 2020-08-12 MED ORDER — FENTANYL CITRATE (PF) 100 MCG/2ML IJ SOLN: 100.0000 ug | Freq: Once | INTRAMUSCULAR | Status: AC

## 2020-08-12 MED ORDER — ONDANSETRON HCL 4 MG/2ML IJ SOLN
INTRAMUSCULAR | Status: DC | PRN
  Administered 2020-08-12: 4 mg via INTRAVENOUS

## 2020-08-12 MED ORDER — ONDANSETRON HCL 4 MG PO TABS: 4.0000 mg | ORAL_TABLET | Freq: Four times a day (QID) | ORAL | Status: DC | PRN

## 2020-08-12 MED ORDER — PROMETHAZINE HCL 25 MG/ML IJ SOLN: 6.2500 mg | INTRAMUSCULAR | Status: DC | PRN

## 2020-08-12 SURGICAL SUPPLY — 32 items
BAG COUNTER SPONGE SURGICOUNT (BAG) ×3 IMPLANT
BLADE AVERAGE 25X9 (BLADE) ×3 IMPLANT
BLADE SAW SGTL MED 73X18.5 STR (BLADE) IMPLANT
BLADE SURG 21 STRL SS (BLADE) ×3 IMPLANT
BNDG COHESIVE 4X5 TAN STRL (GAUZE/BANDAGES/DRESSINGS) ×3 IMPLANT
BNDG GAUZE ELAST 4 BULKY (GAUZE/BANDAGES/DRESSINGS) ×3 IMPLANT
COVER SURGICAL LIGHT HANDLE (MISCELLANEOUS) ×6 IMPLANT
DRAPE DERMATAC (DRAPES) ×6 IMPLANT
DRAPE U-SHAPE 47X51 STRL (DRAPES) ×6 IMPLANT
DRESSING PEEL AND PLC PRVNA 13 (GAUZE/BANDAGES/DRESSINGS) ×2 IMPLANT
DRSG ADAPTIC 3X8 NADH LF (GAUZE/BANDAGES/DRESSINGS) ×3 IMPLANT
DRSG PAD ABDOMINAL 8X10 ST (GAUZE/BANDAGES/DRESSINGS) ×6 IMPLANT
DRSG PEEL AND PLACE PREVENA 13 (GAUZE/BANDAGES/DRESSINGS) ×3
DURAPREP 26ML APPLICATOR (WOUND CARE) ×3 IMPLANT
ELECT REM PT RETURN 9FT ADLT (ELECTROSURGICAL) ×3
ELECTRODE REM PT RTRN 9FT ADLT (ELECTROSURGICAL) ×2 IMPLANT
GAUZE SPONGE 4X4 12PLY STRL (GAUZE/BANDAGES/DRESSINGS) ×3 IMPLANT
GLOVE SURG ORTHO LTX SZ9 (GLOVE) ×3 IMPLANT
GLOVE SURG UNDER POLY LF SZ9 (GLOVE) ×3 IMPLANT
GOWN STRL REUS W/ TWL XL LVL3 (GOWN DISPOSABLE) ×4 IMPLANT
GOWN STRL REUS W/TWL XL LVL3 (GOWN DISPOSABLE) ×6
KIT BASIN OR (CUSTOM PROCEDURE TRAY) ×3 IMPLANT
KIT DRSG PREVENA PLUS 7DAY 125 (MISCELLANEOUS) ×3 IMPLANT
KIT TURNOVER KIT B (KITS) ×3 IMPLANT
NS IRRIG 1000ML POUR BTL (IV SOLUTION) ×3 IMPLANT
PACK ORTHO EXTREMITY (CUSTOM PROCEDURE TRAY) ×3 IMPLANT
PAD ARMBOARD 7.5X6 YLW CONV (MISCELLANEOUS) ×6 IMPLANT
STOCKINETTE IMPERVIOUS LG (DRAPES) IMPLANT
SUT ETHILON 2 0 PSLX (SUTURE) ×3 IMPLANT
TOWEL GREEN STERILE (TOWEL DISPOSABLE) ×3 IMPLANT
TUBE CONNECTING 12X1/4 (SUCTIONS) ×3 IMPLANT
YANKAUER SUCT BULB TIP NO VENT (SUCTIONS) ×3 IMPLANT

## 2020-08-12 NOTE — Progress Notes (Signed)
Called to notify Dr. Sharol Given and Dr. Daiva Huge with anesthesia that patient is COVID positive.

## 2020-08-12 NOTE — Interval H&P Note (Signed)
History and Physical Interval Note:  08/12/2020 11:22 AM  Beth Holden  has presented today for surgery, with the diagnosis of Osteomyelitis Left Foot.  The various methods of treatment have been discussed with the patient and family. After consideration of risks, benefits and other options for treatment, the patient has consented to  Procedure(s): LEFT TRANSMETATARSAL AMPUTATION (Left) as a surgical intervention.  The patient's history has been reviewed, patient examined, no change in status, stable for surgery.  I have reviewed the patient's chart and labs.  Questions were answered to the patient's satisfaction.     Newt Minion

## 2020-08-12 NOTE — Anesthesia Procedure Notes (Signed)
Anesthesia Regional Block: Ankle block   Pre-Anesthetic Checklist: , timeout performed,  Correct Patient, Correct Site, Correct Laterality,  Correct Procedure, Correct Position, site marked,  Risks and benefits discussed,  Pre-op evaluation,  At surgeon's request and post-op pain management  Laterality: Left  Prep: Maximum Sterile Barrier Precautions used, chloraprep       Needles:  Injection technique: Single-shot  Needle Type: Echogenic Needle     Needle Length: 4cm  Needle Gauge: 25     Additional Needles:   Narrative:  Start time: 08/12/2020 12:47 PM End time: 08/12/2020 12:50 PM  Performed by: Personally  Anesthesiologist: [name], MD  Additional Notes: Risks, benefits, and alternative discussed. Patient gave consent for procedure. Patient prepped and draped in sterile fashion. Sedation administered, patient remains easily responsive to voice. Local anesthetic given in 5cc increments with no signs or symptoms of intravascular injection. No pain or paraesthesias with injection. Patient monitored throughout procedure with signs of LAST or immediate complications. Tolerated well.   Tawny Asal, MD

## 2020-08-12 NOTE — Anesthesia Preprocedure Evaluation (Addendum)
Anesthesia Evaluation  Patient identified by MRN, date of birth, ID band Patient awake    Reviewed: Allergy & Precautions, NPO status , Patient's Chart, lab work & pertinent test results, reviewed documented beta blocker date and time   History of Anesthesia Complications (+) PONV and history of anesthetic complications  Airway Mallampati: II  TM Distance: >3 FB Neck ROM: Full    Dental no notable dental hx.    Pulmonary neg pulmonary ROS,    Pulmonary exam normal        Cardiovascular hypertension, Pt. on medications and Pt. on home beta blockers + Peripheral Vascular Disease  Normal cardiovascular exam     Neuro/Psych  Headaches, negative psych ROS   GI/Hepatic Neg liver ROS, GERD  Medicated and Controlled,  Endo/Other  diabetes, Type 2, Oral Hypoglycemic Agents, Insulin Dependent  Renal/GU negative Renal ROS  negative genitourinary   Musculoskeletal  (+) Arthritis , Osteomyelitis Left Foot   Abdominal   Peds  Hematology negative hematology ROS (+)   Anesthesia Other Findings Day of surgery medications reviewed with patient.  Reproductive/Obstetrics negative OB ROS                            Anesthesia Physical Anesthesia Plan  ASA: 3  Anesthesia Plan: MAC and Regional   Post-op Pain Management:    Induction:   PONV Risk Score and Plan: 3 and Treatment may vary due to age or medical condition, Ondansetron, Midazolam and Propofol infusion  Airway Management Planned: Simple Face Mask  Additional Equipment:   Intra-op Plan:   Post-operative Plan:   Informed Consent: I have reviewed the patients History and Physical, chart, labs and discussed the procedure including the risks, benefits and alternatives for the proposed anesthesia with the patient or authorized representative who has indicated his/her understanding and acceptance.       Plan Discussed with:  CRNA  Anesthesia Plan Comments:       Anesthesia Quick Evaluation

## 2020-08-12 NOTE — H&P (Signed)
Beth Holden is an 75 y.o. female.   Chief Complaint: Left Foot ulcer HPI: Patient is a 75 year old woman who is seen in follow-up for Charcot left foot with a ulcer beneath the Charcot rocker-bottom deformity as well as a new ulcer beneath the third and fourth metatarsal heads.  Patient states she has noticed acute cellulitis over this area.  Past Medical History:  Diagnosis Date   Alopecia    Anemia, mild    Arthritis    Chronic cough    sees pulmonologist   Chronic pain    chest wall and abd - s/p extensive eval   Diabetes mellitus with neuropathy ([place])    sees endocrine   Diverticulosis    Fatty liver    GERD (gastroesophageal reflux disease)    takes Nexium bid, hx erosive esophagitis   Headache(784.0)    occasionally;r/t sinus    History of colon polyps    HTN (hypertension)    Hx of amputation of lesser toe (HCC)    sees podiatrist   Hyperlipemia    IBS (irritable bowel syndrome)    Insomnia    takes Elavil nightly   Joint pain    Neuropathy    Neuropathy    Osteomyelitis ([place])    Pneumonia    89/2/22- patient denies   PONV (postoperative nausea and vomiting)    medication did not help with surgery on 03/28/20   Seasonal allergies    takes Zyrtec daily   Sinus tachycardia     Past Surgical History:  Procedure Laterality Date   AMPUTATION  12/28/2011   Procedure: AMPUTATION DIGIT;  Surgeon: Newt Minion, MD;  Location: [place];  Service: Orthopedics;  Laterality: Right;  Right Foot 2nd Toe Amputation at MTP (metatarsophalangeal joint)   AMPUTATION Right 04/25/2012   Procedure: Right Foot 3rd Toe Amputation;  Surgeon: Newt Minion, MD;  Location: [place];  Service: Orthopedics;  Laterality: Right;  Right Foot Third Toe Amputation    AMPUTATION Right 07/27/2012   Procedure: Right 4th Toe Amputation at Metatarsophalangeal;  Surgeon: Newt Minion, MD;  Location: [facility] [place];  Service: Orthopedics;  Laterality: Right;  Right 4th Toe Amputation at Metatarsophalangeal    AMPUTATION Left 07/15/2016   Procedure: Left 2nd Ray Amputation;  Surgeon: Newt Minion, MD;  Location: [place];  Service: Orthopedics;  Laterality: Left;   AMPUTATION Left 11/18/2016   Procedure: Left 3rd and 4th Ray Amputation;  Surgeon: Newt Minion, MD;  Location: [facility];  Service: Orthopedics;  Laterality: Left;   AMPUTATION Right 03/29/2017   Procedure: RIGHT FOOT 3RD AND 4TH RAY AMPUTATION;  Surgeon: Newt Minion, MD;  Location: [facility];  Service: Orthopedics;  Laterality: Right;   COLONOSCOPY     LAPAROSCOPIC APPENDECTOMY  01/05/2011   Procedure: APPENDECTOMY LAPAROSCOPIC;  Surgeon: Pedro Earls, MD;  [facility];  Service: General;  Laterality: N/A;   OOPHORECTOMY  2001   ROTATOR CUFF REPAIR Right    x 2   TUBAL LIGATION     VAGINAL HYSTERECTOMY  2001    Family History  Problem Relation Age of Onset   Lung cancer Mother 36       smoked heavily   Emphysema Mother    Hypertension Father    Hyperlipidemia Father    Diabetes Father    Coronary artery disease Father    Dementia Father    COPD Father        smoked   Stomach cancer Paternal Aunt  Brain cancer Paternal Uncle    Irritable bowel syndrome Other        Several family members on fathers side    Diabetes Other    Stomach cancer Maternal Aunt    Lung cancer Paternal Uncle    Heart disease Paternal Uncle    Colon cancer Neg Hx    Social History:  reports that she has never smoked. She has never used smokeless tobacco. She reports that she does not drink alcohol and does not use drugs.  Allergies:  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   Codeine Other (See Comments)    Makes her crazy   Propoxyphene Hcl Itching    *DARVOCET     No medications prior to admission.    No results found for this or any previous visit (from the past 48 hour(s)). No results found.  Review of Systems  All other systems reviewed and are negative.  Height '5\' 8"'$   (1.727 m), weight 102.1 kg. Physical Exam  Patient is alert, oriented, no adenopathy, well-dressed, normal affect, normal respiratory effort. Examination patient has cellulitis over the distal dorsum of the foot.  She has a good dorsalis pedis and posterior tibial pulse.  After informed consent.  The ulcer was debrided of skin and soft tissue back to healthy viable tissue the ulcer is 2 cm after debridement with probing the ulcer probes down to the fourth and third metatarsals consistent with osteomyelitis.  The wound is 1 cm deep.  There is no ascending cellulitis.  The callus beneath the Charcot rocker-bottom deformity was debrided and the ulcer at this location has completely healed. Lungs clear Heart RRR Assessment/Plan  1. Pain in left foot  2. Subacute osteomyelitis, left ankle and foot ([place])       Plan: With the subacute osteomyelitis of the third and fourth metatarsals and previous toe amputations have discussed with the patient her best option is to proceed with a transmetatarsal amputation we will plan for 23-hour observation she is called in a prescription for Bactrim DS at this time she did have a allergic reaction previously that may have been due to doxycycline. Beth Palmer Giordan [place], PA 08/12/2020, 6:37 AM

## 2020-08-12 NOTE — Op Note (Signed)
08/12/2020  1:48 PM  PATIENT:  Beth Holden    PRE-OPERATIVE DIAGNOSIS:  Osteomyelitis Left Foot  POST-OPERATIVE DIAGNOSIS:  Same  PROCEDURE:  LEFT TRANSMETATARSAL AMPUTATION, APPLICATION OF WOUND VAC  SURGEON:  Newt Minion, MD  PHYSICIAN ASSISTANT:None ANESTHESIA:   General  PREOPERATIVE INDICATIONS:  Beth Holden is a  75 y.o. female with a diagnosis of Osteomyelitis Left Foot who failed conservative measures and elected for surgical management.    The risks benefits and alternatives were discussed with the patient preoperatively including but not limited to the risks of infection, bleeding, nerve injury, cardiopulmonary complications, the need for revision surgery, among others, and the patient was willing to proceed.  OPERATIVE IMPLANTS: 15 cm Prevena wound VAC  '@ENCIMAGES'$ @  OPERATIVE FINDINGS: Good petechial bleeding calcified vessels no abscess no infection at the level of amputation.  OPERATIVE PROCEDURE: Patient was brought the operating room and underwent a regional anesthetic.  After adequate levels anesthesia were obtained patient's left lower extremity was prepped using DuraPrep draped into a sterile field a timeout was called.  A fishmouth incision was made just proximal to the ulcerative tissue plantarly.  Was carried down to bone and a oscillating saw was used to perform a transmetatarsal amputation with the cuts beveled plantarly.  The wound was irrigated normal saline electrocardio was used hemostasis.  The incision was closed using 2-0 nylon a Praveena 13 cm wound VAC was applied this had a good suction fit this was overwrapped with Coban patient was taken the PACU in stable condition.   DISCHARGE PLANNING:  Antibiotic duration: 24 hours antibiotics  Weightbearing: Touchdown weightbearing on the left  Pain medication: Opioid pathway  Dressing care/ Wound VAC: Continue wound VAC for 1 week  Ambulatory devices: Walker  Discharge to: Anticipate discharge  to home when safe with therapy  Follow-up: In the office 1 week post operative.

## 2020-08-12 NOTE — Progress Notes (Signed)
Patient arrived to room 6N19 from PACU. Patient is alert and oriented times 4. Belongings and call bell within reach. Patient is TDWB on LLE and able to ambulate (stand pivot) to the bedside commode with SBA. No c/o pain at this time (2/10). Prevena wound vac (home vac) in place with minimal sanguinous drainage. Patient given brief education on how to charge, change canister, and unhook wound vac. States her husband may have to manage as she cannot tolerate seeing the blood. Covid positive result as patient had Covid one month ago. Outside 14 day window and does not require isolation.

## 2020-08-12 NOTE — Anesthesia Postprocedure Evaluation (Signed)
Anesthesia Post Note  [name]  Procedure(s) Performed: LEFT TRANSMETATARSAL AMPUTATION (Left: Foot) APPLICATION OF WOUND VAC (Left)     Patient location during evaluation: PACU Anesthesia Type: MAC and Regional Level of consciousness: awake and alert and oriented Pain management: pain level controlled Vital Signs Assessment: post-procedure vital signs reviewed and stable Respiratory status: spontaneous breathing, nonlabored ventilation and respiratory function stable Cardiovascular status: blood pressure returned to baseline Postop Assessment: no apparent nausea or vomiting Anesthetic complications: no   No notable events documented.  Last Vitals:  Vitals:   08/12/20 1415 08/12/20 1430  BP: 130/61 (!) 154/69  Pulse: 72 65  Resp: 20 14  Temp:    SpO2: 95% 92%    Last Pain:  Vitals:   08/12/20 1415  TempSrc:   PainSc: 0-No pain                 [name]

## 2020-08-12 NOTE — Transfer of Care (Signed)
Immediate Anesthesia Transfer of Care Note  [name]  Procedure(s) Performed: LEFT TRANSMETATARSAL AMPUTATION (Left: Foot) APPLICATION OF WOUND VAC (Left)  Patient Location: PACU  Anesthesia Type:MAC  Level of Consciousness: awake, alert , oriented and patient cooperative  Airway & Oxygen Therapy: Patient Spontanous Breathing and Patient connected to face mask oxygen  Post-op Assessment: Report given to RN, Post -op Vital signs reviewed and stable and Patient moving all extremities X 4  Post vital signs: Reviewed and stable  Last Vitals:  Vitals Value Taken Time  BP 109/59 08/12/20 1345  Temp    Pulse 73 08/12/20 1348  Resp 13 08/12/20 1348  SpO2 100 % 08/12/20 1348  Vitals shown include unvalidated device data.  Last Pain:  Vitals:   08/12/20 1124  TempSrc:   PainSc: 6       Patients Stated Pain Goal: 2 (67/70/34 0352)  Complications: No notable events documented.

## 2020-08-13 ENCOUNTER — Ambulatory Visit (INDEPENDENT_AMBULATORY_CARE_PROVIDER_SITE_OTHER): Payer: Medicare Other | Admitting: Orthopedic Surgery

## 2020-08-13 ENCOUNTER — Encounter (HOSPITAL_COMMUNITY): Payer: Self-pay | Admitting: Orthopedic Surgery

## 2020-08-13 DIAGNOSIS — U071 COVID-19: Secondary | ICD-10-CM | POA: Diagnosis not present

## 2020-08-13 DIAGNOSIS — M6281 Muscle weakness (generalized): Secondary | ICD-10-CM | POA: Diagnosis not present

## 2020-08-13 DIAGNOSIS — L02612 Cutaneous abscess of left foot: Secondary | ICD-10-CM | POA: Diagnosis not present

## 2020-08-13 DIAGNOSIS — I10 Essential (primary) hypertension: Secondary | ICD-10-CM | POA: Diagnosis not present

## 2020-08-13 DIAGNOSIS — M86272 Subacute osteomyelitis, left ankle and foot: Secondary | ICD-10-CM | POA: Diagnosis not present

## 2020-08-13 DIAGNOSIS — E119 Type 2 diabetes mellitus without complications: Secondary | ICD-10-CM | POA: Diagnosis not present

## 2020-08-13 DIAGNOSIS — Z89432 Acquired absence of left foot: Secondary | ICD-10-CM

## 2020-08-13 LAB — HEMOGLOBIN A1C
Hgb A1c MFr Bld: 8.1 % — ABNORMAL HIGH (ref 4.8–5.6)
Mean Plasma Glucose: 185.77 mg/dL

## 2020-08-13 LAB — GLUCOSE, CAPILLARY
Glucose-Capillary: 215 mg/dL — ABNORMAL HIGH (ref 70–99)
Glucose-Capillary: 245 mg/dL — ABNORMAL HIGH (ref 70–99)

## 2020-08-13 MED ORDER — ZINC SULFATE 220 (50 ZN) MG PO CAPS: 220.0000 mg | ORAL_CAPSULE | Freq: Every day | ORAL | Status: DC

## 2020-08-13 MED ORDER — JUVEN PO PACK: 1.0000 | PACK | Freq: Two times a day (BID) | ORAL | 0 refills | Status: DC

## 2020-08-13 MED ORDER — OXYCODONE HCL 5 MG PO TABS: 5.0000 mg | ORAL_TABLET | ORAL | 0 refills | Status: DC | PRN

## 2020-08-13 NOTE — Progress Notes (Signed)
Patient ID: Beth Holden, female   DOB: [birth year], 75 y.o.   MRN: [id number] Patient is postoperative day 1 transmetatarsal amputation.  The wound VAC is functioning well patient has no complaints.  Plan for discharge to home today prescription for Percocet.

## 2020-08-13 NOTE — Progress Notes (Signed)
Office Visit Note   [name]           Date of Birth: 09-26-1945           MRN: [id number] Visit Date: 08/13/2020              Requested by: Vivi Barrack, [place],  [place] [address] PCP: Vivi Barrack, MD  Chief Complaint  Patient presents with   Left Foot - Routine Post Op    08/12/20 left transmet amputation d/c home today and c/o bleeding from wound vac       HPI: Patient is a 75 year old woman who presents 1 day status post left transmetatarsal amputation.  Patient states she was trying to get into her house and put too much weight on her foot and had acute bleed through the wound VAC dressing.  Assessment & Plan: Visit Diagnoses:  1. 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[place])     Plan: Patient will start Dial soap cleansing and dry dressing changes daily we will give her a new postoperative shoe follow-up in 1 week at which time anticipate we will place her in a stump shrinker  Follow-Up Instructions: Return in about 1 week (around 08/20/2020).   Ortho Exam  Patient is alert, oriented, no adenopathy, well-dressed, normal affect, normal respiratory effort. Examination the wound edges are well approximated there is no maceration.  No ischemic changes no signs of infection.  Patient's symptoms wound VAC has approximately 75 cc of fluid.  Imaging: No results found. No images are attached to the encounter.  Labs: Lab Results  Component Value Date   HGBA1C 8.1 (H) 08/12/2020   HGBA1C 8.4 (H) 05/29/2020   HGBA1C 8.0 (A) 03/18/2020   ESRSEDRATE 51 (H) 12/24/2019   ESRSEDRATE 36 (H) 02/24/2017   CRP 13.2 (H) 12/24/2019   CRP 1.2 (H) 02/24/2017   LABURIC 5.5 09/01/2015   REPTSTATUS 06/03/2020 FINAL 05/29/2020   CULT  05/29/2020    NO GROWTH 5 DAYS Performed at [facility], [place] [address] [place], [place] [address]    LABORGA GROUP B STREP (S.AGALACTIAE) ISOLATED 12/26/2014     Lab Results  Component Value  Date   ALBUMIN 3.4 (L) 05/29/2020   ALBUMIN 3.6 12/26/2019   ALBUMIN 4.0 12/19/2019   PREALBUMIN 19.4 03/05/2012    No results found for: MG No results found for: VD25OH  Lab Results  Component Value Date   PREALBUMIN 19.4 03/05/2012   CBC EXTENDED Latest Ref Rng & Units 08/12/2020 07/02/2020 05/31/2020  WBC 4.0 - 10.5 K/uL 9.8 11.7(H) 8.9  RBC 3.87 - 5.11 MIL/uL 4.02 4.18 3.92  HGB 12.0 - 15.0 g/dL 10.8(L) 11.1(L) 10.5(L)  HCT 36.0 - 46.0 % 34.4(L) 35.5(L) 33.5(L)  PLT 150 - 400 K/uL 403(H) 383 382  NEUTROABS 1.7 - 7.7 K/uL - 7.8(H) -  LYMPHSABS 0.7 - 4.0 K/uL - 2.6 -     There is no height or weight on file to calculate BMI.  Orders:  No orders of the defined types were placed in this encounter.  No orders of the defined types were placed in this encounter.    Procedures: No procedures performed  Clinical Data: No additional findings.  ROS:  All other systems negative, except as noted in the HPI. Review of Systems  Objective: Vital Signs: There were no vitals taken for this visit.  Specialty Comments:  No specialty comments available.  PMFS History: Patient Active Problem List  Diagnosis Date Noted   Abscess of left foot 08/12/2020   Memory loss 11/21/2019   Mild nonproliferative diabetic retinopathy of both eyes ([place]) 10/23/2019   Nuclear sclerotic cataract of both eyes 10/23/2019   Posterior vitreous detachment of right eye 10/23/2019   Retinal hemorrhage of left eye 10/23/2019   Hyperlipidemia associated with type 2 diabetes mellitus ([place]) 11/30/2017   Impingement syndrome of right shoulder 07/06/2017   Iron deficiency anemia 05/11/2017   Morbid obesity ([place]) 04/24/2017   Subacute osteomyelitis, left ankle and foot ([place])    Anemia 02/24/2017   Baker's cyst 07/10/2016   Onychomycosis 12/07/2015   Upper airway cough syndrome 03/05/2014   History of amputation of lesser toe of right foot ([place]) 04/15/2013   Hypertension associated with diabetes ([place])  12/05/2006   Uncontrolled type 2 diabetes mellitus with peripheral circulatory disorder ([place]) 10/12/2006   Dyslipidemia associated with type 2 diabetes mellitus ([place]) 10/12/2006   Allergic rhinitis 10/12/2006   GERD - Followed by Dr. Fuller Plan 10/12/2006   IRRITABLE BOWEL SYNDROME - followed by Dr. Fuller Plan 10/12/2006   Peripheral neuropathy 10/11/2006   ALOPECIA NEC 10/11/2006   Past Medical History:  Diagnosis Date   Alopecia    Anemia, mild    Arthritis    Chronic cough    sees pulmonologist   Chronic pain    chest wall and abd - s/p extensive eval   Diabetes mellitus with neuropathy (HCC)    sees endocrine   Diverticulosis    Dyspnea    Fatty liver    GERD (gastroesophageal reflux disease)    takes Nexium bid, hx erosive esophagitis   Headache(784.0)    occasionally;r/t sinus    History of colon polyps    HTN (hypertension)    Hx of amputation of lesser toe (HCC)    sees podiatrist   Hyperlipemia    IBS (irritable bowel syndrome)    Insomnia    takes Elavil nightly   Joint pain    Neuropathy    Neuropathy    Osteomyelitis ([place])    Pneumonia    89/2/22- patient denies   PONV (postoperative nausea and vomiting)    medication did not help with surgery on 03/28/20   Seasonal allergies    takes Zyrtec daily   Sinus tachycardia     Family History  Problem Relation Age of Onset   Lung cancer Mother 74       smoked heavily   Emphysema Mother    Hypertension Father    Hyperlipidemia Father    Diabetes Father    Coronary artery disease Father    Dementia Father    COPD Father        smoked   Stomach cancer Paternal Aunt    Brain cancer Paternal Uncle    Irritable bowel syndrome Other        Several family members on fathers side    Diabetes Other    Stomach cancer Maternal Aunt    Lung cancer Paternal Uncle    Heart disease Paternal Uncle    Colon cancer Neg Hx     Past Surgical History:  Procedure Laterality Date   AMPUTATION  12/28/2011   Procedure:  AMPUTATION DIGIT;  Surgeon: Newt Minion, MD;  Location: [facility];  Service: Orthopedics;  Laterality: Right;  Right Foot 2nd Toe Amputation at MTP (metatarsophalangeal joint)   AMPUTATION Right 04/25/2012   Procedure: Right Foot 3rd Toe Amputation;  Surgeon: Newt Minion, MD;  Location: [facility];  Service: Orthopedics;  Laterality: Right;  Right Foot Third Toe Amputation    AMPUTATION Right 07/27/2012   Procedure: Right 4th Toe Amputation at Metatarsophalangeal;  Surgeon: Newt Minion, MD;  Location: [facility];  Service: Orthopedics;  Laterality: Right;  Right 4th Toe Amputation at Metatarsophalangeal   AMPUTATION Left 07/15/2016   Procedure: Left 2nd Ray Amputation;  Surgeon: Newt Minion, MD;  Location: [facility];  Service: Orthopedics;  Laterality: Left;   AMPUTATION Left 11/18/2016   Procedure: Left 3rd and 4th Ray Amputation;  Surgeon: Newt Minion, MD;  Location: [facility];  Service: Orthopedics;  Laterality: Left;   AMPUTATION Right 03/29/2017   Procedure: RIGHT FOOT 3RD AND 4TH RAY AMPUTATION;  Surgeon: Newt Minion, MD;  Location: [facility];  Service: Orthopedics;  Laterality: Right;   AMPUTATION Left 08/12/2020   Procedure: LEFT TRANSMETATARSAL AMPUTATION;  Surgeon: Newt Minion, MD;  Location: [facility];  Service: Orthopedics;  Laterality: Left;   APPLICATION OF WOUND VAC Left 08/12/2020   Procedure: APPLICATION OF WOUND VAC;  Surgeon: Newt Minion, MD;  Location: [facility];  Service: Orthopedics;  Laterality: Left;   COLONOSCOPY     LAPAROSCOPIC APPENDECTOMY  01/05/2011   Procedure: APPENDECTOMY LAPAROSCOPIC;  Surgeon: Pedro Earls, MD;  Location: [facility];  Service: General;  Laterality: N/A;   OOPHORECTOMY  2001   ROTATOR CUFF REPAIR Right    x 2   TUBAL LIGATION     VAGINAL HYSTERECTOMY  2001   Social History   Occupational History   Occupation: Retired   Tobacco Use   Smoking status: Never   Smokeless tobacco: Never  Vaping Use   Vaping Use: Never used  Substance and Sexual Activity    Alcohol use: Never   Drug use: Never   Sexual activity: Yes    Birth control/protection: Surgical

## 2020-08-13 NOTE — Discharge Summary (Signed)
Discharge Diagnoses:  Active Problems:   Subacute osteomyelitis, left ankle and foot (HCC)   Abscess of left foot   Surgeries: Procedure(s): LEFT TRANSMETATARSAL AMPUTATION APPLICATION OF WOUND VAC on 08/12/2020    Consultants:   Discharged Condition: Improved  Hospital Course: Beth Holden is an 75 y.o. female who was admitted 08/12/2020 with a chief complaint of left foot osteomyelitis, with a final diagnosis of Osteomyelitis Left Foot.  Patient was brought to the operating room on 08/12/2020 and underwent Procedure(s): LEFT TRANSMETATARSAL AMPUTATION APPLICATION OF WOUND VAC.    Patient was given perioperative antibiotics:  Anti-infectives (From admission, onward)    Start     Dose/Rate Route Frequency Ordered Stop   08/12/20 1900  ceFAZolin (ANCEF) IVPB 1 g/50 mL premix        1 g 100 mL/hr over 30 Minutes Intravenous Every 6 hours 08/12/20 1746 08/13/20 1259   08/12/20 1100  ceFAZolin (ANCEF) IVPB 2g/100 mL premix        2 g 200 mL/hr over 30 Minutes Intravenous On call to O.R. 08/12/20 1049 08/12/20 1338     .  Patient was given sequential compression devices, early ambulation, and aspirin for DVT prophylaxis.  Recent vital signs: Patient Vitals for the past 24 hrs:  BP Temp Temp src Pulse Resp SpO2  08/13/20 0817 (!) 136/50 97.7 F (36.5 C) Oral 74 19 92 %  08/13/20 0539 (!) 121/56 97.7 F (36.5 C) Oral 66 16 97 %  08/13/20 0038 (!) 147/67 97.8 F (36.6 C) Oral 75 18 99 %  08/12/20 2135 136/60 98 F (36.7 C) Oral 62 17 95 %  08/12/20 1738 (!) 147/67 97.8 F (36.6 C) Oral 75 18 99 %  08/12/20 1703 (!) 149/69 97.9 F (36.6 C) -- 61 20 97 %  08/12/20 1648 131/62 -- -- 65 17 97 %  08/12/20 1630 (!) 157/62 -- -- 62 18 98 %  08/12/20 1615 (!) 120/42 -- -- 71 19 96 %  08/12/20 1545 (!) 149/66 -- -- 68 16 96 %  08/12/20 1530 (!) 138/56 -- -- (!) 54 14 93 %  08/12/20 1515 137/70 -- -- (!) 56 12 94 %  08/12/20 1500 (!) 141/55 -- -- 66 14 92 %  08/12/20 1445 (!) 139/58  -- -- (!) 52 13 94 %  08/12/20 1430 (!) 154/69 -- -- 65 14 92 %  08/12/20 1415 130/61 -- -- 72 20 95 %  08/12/20 1400 (!) 143/61 -- -- 71 15 95 %  08/12/20 1345 (!) 109/59 (!) 97.1 F (36.2 C) -- 81 12 100 %  08/12/20 1250 (!) 167/69 -- -- 74 12 97 %  08/12/20 1246 (!) 184/79 -- -- 77 12 100 %  08/12/20 1104 (!) 157/56 -- -- -- -- --  08/12/20 1102 -- 97.6 F (36.4 C) Oral 69 18 96 %  .  Recent laboratory studies: No results found.  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   Codeine Other (See Comments)   Makes her crazy   Propoxyphene Hcl Itching   *DARVOCET         Medication List     STOP taking these medications    amoxicillin-clavulanate 875-125 MG tablet Commonly known as: AUGMENTIN   dextromethorphan-guaiFENesin 30-600 MG 12hr tablet Commonly known as: MUCINEX DM   fluconazole 150 MG tablet Commonly known as: Diflucan   promethazine-dextromethorphan 6.25-15  MG/5ML syrup Commonly known as: PROMETHAZINE-DM   silver sulfADIAZINE 1 % cream Commonly known as: Silvadene   sulfamethoxazole-trimethoprim 800-160 MG tablet Commonly known as: BACTRIM DS       TAKE these medications    acetaminophen 500 MG tablet Commonly known as: TYLENOL Take 500 mg by mouth every 8 (eight) hours as needed for mild pain.   benzonatate 100 MG capsule Commonly known as: TESSALON Take 1 capsule (100 mg total) by mouth 2 (two) times daily as needed for cough.   betamethasone dipropionate 0.05 % cream Apply 1 application topically 2 (two) times daily as needed (IRRITATION).   cetirizine 10 MG tablet Commonly known as: ZYRTEC Take 1 tablet (10 mg total) by mouth daily.   cholecalciferol 1000 units tablet Commonly known as: VITAMIN D Take 1,000 Units by mouth daily.   dapagliflozin propanediol 5 MG Tabs tablet Commonly known as: Farxiga Take 1 tablet (5 mg total) by mouth daily  before breakfast.   dicyclomine 10 MG capsule Commonly known as: BENTYL Take 1 capsule (10 mg total) by mouth 3 (three) times daily as needed for spasms.   glucagon 1 MG Solr injection Commonly known as: GLUCAGEN Inject 1 mg into the muscle once as needed for up to 1 dose for low blood sugar.   insulin regular 100 units/mL injection Commonly known as: NovoLIN R ReliOn Inject 0.2-0.3 mLs (20-30 Units total) into the skin 3 (three) times daily before meals. Per sliding scale   irbesartan-hydrochlorothiazide 150-12.5 MG tablet Commonly known as: AVALIDE Take 1 tablet by mouth daily.   metFORMIN 1000 MG tablet Commonly known as: GLUCOPHAGE Take 1 tablet (1,000 mg total) by mouth 2 (two) times daily with a meal.   metoprolol succinate 25 MG 24 hr tablet Commonly known as: TOPROL-XL Take 1 tablet (25 mg total) by mouth daily.   nutrition supplement (JUVEN) Pack Take 1 packet by mouth 2 (two) times daily between meals.   omeprazole 40 MG capsule Commonly known as: PRILOSEC Take 1 capsule (40 mg total) by mouth 2 (two) times daily.   OneTouch Ultra test strip Generic drug: glucose blood USE  STRIP TO CHECK GLUCOSE THREE TIMES DAILY   OVER THE COUNTER MEDICATION Apply 1-3 application topically at bedtime as needed (foot pain). TOPRICIN FOOT CREAM - NEUROPATHY FOOT CREAM **APPLIES TO BOTH FEET AT BEDTIME**   oxyCODONE 5 MG immediate release tablet Commonly known as: Oxy IR/ROXICODONE Take 1-2 tablets (5-10 mg total) by mouth every 4 (four) hours as needed for moderate pain (pain score 4-6).   PROBIOTIC DAILY PO Take 1 capsule by mouth daily.   rosuvastatin 20 MG tablet Commonly known as: Crestor Take 1 tablet (20 mg total) by mouth daily.   vitamin B-12 1000 MCG tablet Commonly known as: CYANOCOBALAMIN Take 1,000 mcg daily by mouth.   vitamin C 500 MG tablet Commonly known as: ASCORBIC ACID Take 500 mg by mouth daily.   zinc sulfate 220 (50 Zn) MG capsule Take 1  capsule (220 mg total) by mouth daily.       ASK your doctor about these medications    azelastine 0.1 % nasal spray Commonly known as: ASTELIN Place 2 sprays into both nostrils 2 (two) times daily.   Fluocinolone Acetonide 0.01 % Oil Use 1-2 drops daily as needed.   gabapentin 100 MG capsule Commonly known as: NEURONTIN TAKE 2 TO 3 CAPSULES AT BEDTIME   insulin NPH Human 100 UNIT/ML injection Commonly known as: NovoLIN N ReliOn Inject 0.15-0.2 mLs (  15-20 Units total) into the skin See admin instructions. Inject under skin 12-15 units in am  and 10-15 units at bedtime per sliding scale   Insulin Syringe-Needle U-100 31G X 5/16" 1 ML Misc Commonly known as: RELION INSULIN SYRINGE 1ML/31G USE 2 TIMES A DAY   ONE TOUCH ULTRA 2 w/Device Kit Use to check blood sugar        Diagnostic Studies: XR Foot Complete Left  Result Date: 08/06/2020 Three-view radiographs of the left foot shows destructive bony changes of the third and fourth metatarsal consistent with subacute osteomyelitis.   Patient benefited maximally from their hospital stay and there were no complications.     Disposition: Discharge disposition: 01-Home or Self Care      Discharge Instructions     Call MD / Call 911   Complete by: As directed    If you experience chest pain or shortness of breath, CALL 911 and be transported to the hospital emergency room.  If you develope a fever above 101 F, pus (white drainage) or increased drainage or redness at the wound, or calf pain, call your surgeon's office.   Constipation Prevention   Complete by: As directed    Drink plenty of fluids.  Prune juice may be helpful.  You may use a stool softener, such as Colace (over the counter) 100 mg twice a day.  Use MiraLax (over the counter) for constipation as needed.   Diet - low sodium heart healthy   Complete by: As directed    Discharge instructions   Complete by: As directed    Call if vac alarms   Increase  activity slowly as tolerated   Complete by: As directed    Negative Pressure Wound Therapy - Incisional   Complete by: As directed    Show patient how to attach preveena vac   Negative Pressure Wound Therapy - Incisional   Complete by: As directed    Show patient how to attach vac   Post-operative opioid taper instructions:   Complete by: As directed    POST-OPERATIVE OPIOID TAPER INSTRUCTIONS: It is important to wean off of your opioid medication as soon as possible. If you do not need pain medication after your surgery it is ok to stop day one. Opioids include: Codeine, Hydrocodone(Norco, Vicodin), Oxycodone(Percocet, oxycontin) and hydromorphone amongst others.  Long term and even short term use of opiods can cause: Increased pain response Dependence Constipation Depression Respiratory depression And more.  Withdrawal symptoms can include Flu like symptoms Nausea, vomiting And more Techniques to manage these symptoms Hydrate well Eat regular healthy meals Stay active Use relaxation techniques(deep breathing, meditating, yoga) Do Not substitute Alcohol to help with tapering If you have been on opioids for less than two weeks and do not have pain than it is ok to stop all together.  Plan to wean off of opioids This plan should start within one week post op of your joint replacement. Maintain the same interval or time between taking each dose and first decrease the dose.  Cut the total daily intake of opioids by one tablet each day Next start to increase the time between doses. The last dose that should be eliminated is the evening dose.          Follow-up Information     Haskell Rihn, [place]ely [place], [place] Follow up in 1 week(s).   Specialty: Orthopedic Surgery Contact information: [address] [place] [address] [phone]  Signed: Bevely Palmer Ramla Hase 08/13/2020, 8:20 AM

## 2020-08-13 NOTE — TOC Initial Note (Addendum)
Transition of Care [facility]) - Initial/Assessment Note    Patient Details  Name: Beth Holden MRN: [id number] Date of Birth: 05-22-1945  [facility] CM/SW Contact:    Marilu Favre, RN Phone Number: 08/13/2020, 8:55 AM  Clinical Narrative:                 Spoke to patient at bedside. Discussed PT recommendations for HHPT. Patient in agreement. Provided Medicare.gov list of agencies. Patient will discuss with daughter and then decide on agency. NCM will follow up with patient. If patient makes decision prior to NCM returning to room she will let nurse know. Patient understands once she decides , NCM will need to confirm with agency they can accept.   Will need HHPT orders and face to face   Patient would like [facility]. Tommi Rumps with [facility] accepted referral  Expected Discharge Plan: [place]     Patient Goals and CMS Choice Patient states their goals for this hospitalization and ongoing recovery are:: to go home CMS Medicare.gov Compare Post Acute Care list provided to:: Patient Choice offered to / list presented to : Patient  Expected Discharge Plan and Services Expected Discharge Plan: [place]   Discharge Planning Services: CM Consult   Living arrangements for the past 2 months: Single Family Home Expected Discharge Date: 08/13/20               DME Arranged: N/A         HH Arranged: PT          Prior Living Arrangements/Services Living arrangements for the past 2 months: Single Family Home Lives with:: Spouse Patient language and need for interpreter reviewed:: Yes Do you feel safe going back to the place where you live?: Yes      Need for Family Participation in Patient Care: Yes (Comment) Care giver support system in place?: Yes (comment)   Criminal Activity/Legal Involvement Pertinent to Current Situation/Hospitalization: No - Comment as needed  Activities of Daily Living Home Assistive Devices/Equipment:  Eyeglasses, Blood pressure cuff, CBG Meter, Cane (specify quad or straight), Wheelchair, Environmental consultant (specify type) ADL Screening (condition at time of admission) Patient's cognitive ability adequate to safely complete daily activities?: Yes Is the patient deaf or have difficulty hearing?: No Does the patient have difficulty seeing, even when wearing glasses/contacts?: No Does the patient have difficulty concentrating, remembering, or making decisions?: No Patient able to express need for assistance with ADLs?: Yes Does the patient have difficulty dressing or bathing?: No Independently performs ADLs?: Yes (appropriate for developmental age) Does the patient have difficulty walking or climbing stairs?: Yes Weakness of Legs: Left Weakness of Arms/Hands: None  Permission Sought/Granted   Permission granted to share information with : No              Emotional Assessment Appearance:: Appears stated age Attitude/Demeanor/Rapport: Engaged Affect (typically observed): Accepting Orientation: : Oriented to Self, Oriented to Place, Oriented to  Time, Oriented to Situation Alcohol / Substance Use: Not Applicable Psych Involvement: No (comment)  Admission diagnosis:  Abscess of left foot [L02.612] Patient Active Problem List   Diagnosis Date Noted   Abscess of left foot 08/12/2020   Memory loss 11/21/2019   Mild nonproliferative diabetic retinopathy of both eyes ([place]) 10/23/2019   Nuclear sclerotic cataract of both eyes 10/23/2019   Posterior vitreous detachment of right eye 10/23/2019   Retinal hemorrhage of left eye 10/23/2019   Hyperlipidemia associated with type 2 diabetes mellitus (  [place]) 11/30/2017   Impingement syndrome of right shoulder 07/06/2017   Iron deficiency anemia 05/11/2017   Morbid obesity ([place]) 04/24/2017   Subacute osteomyelitis, left ankle and foot ([place])    Anemia 02/24/2017   Baker's cyst 07/10/2016   Onychomycosis 12/07/2015   Upper airway cough syndrome 03/05/2014    History of amputation of lesser toe of right foot ([place]) 04/15/2013   Hypertension associated with diabetes ([place]) 12/05/2006   Uncontrolled type 2 diabetes mellitus with peripheral circulatory disorder ([place]) 10/12/2006   Dyslipidemia associated with type 2 diabetes mellitus ([place]) 10/12/2006   Allergic rhinitis 10/12/2006   GERD - Followed by Dr. Fuller Plan 10/12/2006   IRRITABLE BOWEL SYNDROME - followed by Dr. Fuller Plan 10/12/2006   Peripheral neuropathy 10/11/2006   ALOPECIA NEC 10/11/2006   PCP:  Vivi Barrack, MD Pharmacy:   [place], [place] - [address] [place] [place] [address] Phone: [phone] Fax: [phone]  [facility], [place] [facility] Registered [employer] [facility] [place] [address] Phone: [phone] Fax: [phone]     Social Determinants of Health (SDOH) Interventions    Readmission Risk Interventions No flowsheet data found.

## 2020-08-13 NOTE — Evaluation (Signed)
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 PMH includes alopecia, anemia, DM with neuropathy, diverticulosis, GERD, HTN, IBS, HLD, osteomyelitis, R 2nd and 3rd toe amp.  Clinical Impression  Pt presents with generalized weakness, decreased knowledge and application of TDWB precautions, impaired balance, and decreased activity tolerance vs baseline. Pt to benefit from acute PT to address deficits. Pt ambulated short room distance with RW and close guard for safety, further gait distance limited due to PT concerns about pt difficulty maintaining TDWB precautions as well as pt nausea. Pt with difficulty maintaining WB precautions, requires max cuing and PT encouraged short-distance gait only (15-20 ft) at home, otherwise encouraged to use transport chair to ensure post-op healing. PT also recommended close supervision from husband during all mobility, especially stair navigation. PT to progress mobility as tolerated, and will continue to follow acutely.      Follow Up Recommendations Home health PT;Supervision for mobility/OOB    Equipment Recommendations  None recommended by PT    Recommendations for Other Services       Precautions / Restrictions Precautions Precautions: Fall Required Braces or Orthoses: Other Brace Other Brace: darco shoe Restrictions Weight Bearing Restrictions: Yes LUE Weight Bearing: Touch down weight bearing Other Position/Activity Restrictions: Encouraged through heel only      Mobility  Bed Mobility Overal bed mobility: Needs Assistance             General bed mobility comments: up on BSC    Transfers Overall transfer level: Needs assistance Equipment used: Rolling walker (2 wheeled) Transfers: Sit to/from Stand Sit to Stand: Min guard         General transfer comment:  for safety, cues for hand placement and using RLE primarily for WB to rise.  Ambulation/Gait Ambulation/Gait assistance: Min guard Gait Distance (Feet): 10 Feet Assistive device: Rolling walker (2 wheeled) Gait Pattern/deviations: Step-to pattern;Decreased weight shift to left;Trunk flexed Gait velocity: decr   General Gait Details: close guard for safety, max cuing for TDWB through heel only on LLE. Attempted additional 10 ft of mobility with knee scooter, pt unsafe on knee scooter and has difficulty controlling speed/direction. PT encouraged no use of knee scooter given fall risk.  Stairs Stairs: Yes Stairs assistance: Min guard Stair Management: One rail Left;Step to pattern;Forwards Number of Stairs: 5 General stair comments: close guard for safety, verbal cuing for sequencing tasks (up with RLE leading, down with LLE leading with heel WB only, using railing to offweight LLE).  Wheelchair Mobility    Modified Rankin (Stroke Patients Only)       Balance Overall balance assessment: Needs assistance Sitting-balance support: No upper extremity supported;Feet supported Sitting balance-Leahy Scale: Good     Standing balance support: Bilateral upper extremity supported;During functional activity Standing balance-Leahy Scale: Poor Standing balance comment: reliant on external support                             Pertinent Vitals/Pain Pain Assessment: 0-10 Pain Score: 2  Pain Location: L foot Pain Descriptors / Indicators: Sore Pain Intervention(s): Limited activity within patient's tolerance;Monitored during session;Repositioned    Home Living Family/patient expects to be discharged to:: Private residence Living Arrangements: Spouse/significant other Available Help at Discharge: Family;Available 24 hours/day Type of Home: House Home Access: Stairs to enter Entrance Stairs-Rails: Right;Left Entrance  Stairs-Number of Steps: 5 Home Layout: One level Home  Equipment: [place] - 2 wheels;Bedside commode;Other (comment);Transport chair Additional Comments: knee walker    Prior Function Level of Independence: Independent               Hand Dominance   Dominant Hand: Right    Extremity/Trunk Assessment   Upper Extremity Assessment Upper Extremity Assessment: Defer to OT evaluation    Lower Extremity Assessment Lower Extremity Assessment: Generalized weakness (with history of peripheral neuropathy)    Cervical / Trunk Assessment Cervical / Trunk Assessment: Normal  Communication   Communication: No difficulties  Cognition Arousal/Alertness: Awake/alert Behavior During Therapy: WFL for tasks assessed/performed Overall Cognitive Status: Within Functional Limits for tasks assessed Area of Impairment: Safety/judgement                         Safety/Judgement: Decreased awareness of safety;Decreased awareness of deficits     General Comments: Pt with difficulty maintaining WB precautions, requires max cuing and PT encouraged short-distance gait only (15-20 ft) otherwise encouraged to use transport chair to ensure healing.      General Comments      Exercises     Assessment/Plan    PT Assessment Patient needs continued PT services  PT Problem List Decreased strength;Decreased mobility;Decreased knowledge of precautions;Decreased safety awareness;Decreased activity tolerance;Decreased balance;Decreased knowledge of use of DME;Pain;Impaired sensation       PT Treatment Interventions DME instruction;Therapeutic activities;Gait training;Therapeutic exercise;Patient/family education;Balance training;Stair training;Functional mobility training;Neuromuscular re-education    PT Goals (Current goals can be found in the Care Plan section)  Acute Rehab PT Goals Patient Stated Goal: go home today PT Goal Formulation: With patient Time For Goal Achievement: 08/27/20 Potential to Achieve Goals: Good    Frequency Min  3X/week   Barriers to discharge        Co-evaluation               AM-PAC PT "6 Clicks" Mobility  Outcome Measure Help needed turning from your back to your side while in a flat bed without using bedrails?: A Little Help needed moving from lying on your back to sitting on the side of a flat bed without using bedrails?: A Little Help needed moving to and from a bed to a chair (including a wheelchair)?: A Little Help needed standing up from a chair using your arms (e.g., wheelchair or bedside chair)?: A Little Help needed to walk in hospital room?: A Little Help needed climbing 3-5 steps with a railing? : A Little 6 Click Score: 18    End of Session Equipment Utilized During Treatment: Other (comment) (darco shoe) Activity Tolerance: Other (comment);Patient limited by fatigue (limited by nausea) Patient left: in chair;with call bell/phone within reach;with chair alarm set Nurse Communication: Mobility status PT Visit Diagnosis: Other abnormalities of gait and mobility (R26.89);Muscle weakness (generalized) (M62.81)    Time: [id number] PT Time Calculation (min) (ACUTE ONLY): 34 min   Charges:   PT Evaluation $PT Eval Low Complexity: 1 Low PT Treatments $Gait Training: 8-22 mins        Stacie Glaze, PT DPT Acute Rehabilitation Services Pager [phone]  Office [phone]   [place] 08/13/2020, 8:37 AM

## 2020-08-15 DIAGNOSIS — Z8701 Personal history of pneumonia (recurrent): Secondary | ICD-10-CM | POA: Diagnosis not present

## 2020-08-15 DIAGNOSIS — Z794 Long term (current) use of insulin: Secondary | ICD-10-CM | POA: Diagnosis not present

## 2020-08-15 DIAGNOSIS — Z89431 Acquired absence of right foot: Secondary | ICD-10-CM | POA: Diagnosis not present

## 2020-08-15 DIAGNOSIS — E785 Hyperlipidemia, unspecified: Secondary | ICD-10-CM | POA: Diagnosis not present

## 2020-08-15 DIAGNOSIS — M170 Bilateral primary osteoarthritis of knee: Secondary | ICD-10-CM | POA: Diagnosis not present

## 2020-08-15 DIAGNOSIS — G4700 Insomnia, unspecified: Secondary | ICD-10-CM | POA: Diagnosis not present

## 2020-08-15 DIAGNOSIS — E11610 Type 2 diabetes mellitus with diabetic neuropathic arthropathy: Secondary | ICD-10-CM | POA: Diagnosis not present

## 2020-08-15 DIAGNOSIS — I10 Essential (primary) hypertension: Secondary | ICD-10-CM | POA: Diagnosis not present

## 2020-08-15 DIAGNOSIS — Z89432 Acquired absence of left foot: Secondary | ICD-10-CM | POA: Diagnosis not present

## 2020-08-15 DIAGNOSIS — K581 Irritable bowel syndrome with constipation: Secondary | ICD-10-CM | POA: Diagnosis not present

## 2020-08-15 DIAGNOSIS — Z86010 Personal history of colonic polyps: Secondary | ICD-10-CM | POA: Diagnosis not present

## 2020-08-15 DIAGNOSIS — G8929 Other chronic pain: Secondary | ICD-10-CM | POA: Diagnosis not present

## 2020-08-15 DIAGNOSIS — J302 Other seasonal allergic rhinitis: Secondary | ICD-10-CM | POA: Diagnosis not present

## 2020-08-15 DIAGNOSIS — Z9181 History of falling: Secondary | ICD-10-CM | POA: Diagnosis not present

## 2020-08-15 DIAGNOSIS — Z89421 Acquired absence of other right toe(s): Secondary | ICD-10-CM | POA: Diagnosis not present

## 2020-08-15 DIAGNOSIS — D649 Anemia, unspecified: Secondary | ICD-10-CM | POA: Diagnosis not present

## 2020-08-15 DIAGNOSIS — Z4781 Encounter for orthopedic aftercare following surgical amputation: Secondary | ICD-10-CM | POA: Diagnosis not present

## 2020-08-15 DIAGNOSIS — K760 Fatty (change of) liver, not elsewhere classified: Secondary | ICD-10-CM | POA: Diagnosis not present

## 2020-08-15 DIAGNOSIS — Z89422 Acquired absence of other left toe(s): Secondary | ICD-10-CM | POA: Diagnosis not present

## 2020-08-15 DIAGNOSIS — Z7984 Long term (current) use of oral hypoglycemic drugs: Secondary | ICD-10-CM | POA: Diagnosis not present

## 2020-08-15 DIAGNOSIS — E1140 Type 2 diabetes mellitus with diabetic neuropathy, unspecified: Secondary | ICD-10-CM | POA: Diagnosis not present

## 2020-08-15 DIAGNOSIS — K5790 Diverticulosis of intestine, part unspecified, without perforation or abscess without bleeding: Secondary | ICD-10-CM | POA: Diagnosis not present

## 2020-08-15 DIAGNOSIS — K219 Gastro-esophageal reflux disease without esophagitis: Secondary | ICD-10-CM | POA: Diagnosis not present

## 2020-08-15 DIAGNOSIS — U071 COVID-19: Secondary | ICD-10-CM | POA: Diagnosis not present

## 2020-08-17 ENCOUNTER — Telehealth: Payer: Self-pay

## 2020-08-17 DIAGNOSIS — E11610 Type 2 diabetes mellitus with diabetic neuropathic arthropathy: Secondary | ICD-10-CM | POA: Diagnosis not present

## 2020-08-17 DIAGNOSIS — I10 Essential (primary) hypertension: Secondary | ICD-10-CM | POA: Diagnosis not present

## 2020-08-17 DIAGNOSIS — E1140 Type 2 diabetes mellitus with diabetic neuropathy, unspecified: Secondary | ICD-10-CM | POA: Diagnosis not present

## 2020-08-17 DIAGNOSIS — D649 Anemia, unspecified: Secondary | ICD-10-CM | POA: Diagnosis not present

## 2020-08-17 DIAGNOSIS — U071 COVID-19: Secondary | ICD-10-CM | POA: Diagnosis not present

## 2020-08-17 DIAGNOSIS — Z4781 Encounter for orthopedic aftercare following surgical amputation: Secondary | ICD-10-CM | POA: Diagnosis not present

## 2020-08-17 NOTE — Telephone Encounter (Signed)
.  Home Health verbal orders [place] Name: [name] number: [id number]  Requesting OT/PT/Skilled nursing/Social Work/Speech: PT   Reason: recent amputation of left foot / fall prevention  Frequency:2x for 4 weeks 1x week for 4 week   Please forward to [facility] pool or providers CMA

## 2020-08-19 DIAGNOSIS — U071 COVID-19: Secondary | ICD-10-CM | POA: Diagnosis not present

## 2020-08-19 DIAGNOSIS — D649 Anemia, unspecified: Secondary | ICD-10-CM | POA: Diagnosis not present

## 2020-08-19 DIAGNOSIS — Z4781 Encounter for orthopedic aftercare following surgical amputation: Secondary | ICD-10-CM | POA: Diagnosis not present

## 2020-08-19 DIAGNOSIS — I10 Essential (primary) hypertension: Secondary | ICD-10-CM | POA: Diagnosis not present

## 2020-08-19 DIAGNOSIS — E11610 Type 2 diabetes mellitus with diabetic neuropathic arthropathy: Secondary | ICD-10-CM | POA: Diagnosis not present

## 2020-08-19 DIAGNOSIS — E1140 Type 2 diabetes mellitus with diabetic neuropathy, unspecified: Secondary | ICD-10-CM | POA: Diagnosis not present

## 2020-08-20 ENCOUNTER — Ambulatory Visit (INDEPENDENT_AMBULATORY_CARE_PROVIDER_SITE_OTHER): Payer: Medicare Other | Admitting: Orthopedic Surgery

## 2020-08-20 DIAGNOSIS — Z89432 Acquired absence of left foot: Secondary | ICD-10-CM

## 2020-08-23 ENCOUNTER — Encounter: Payer: Self-pay | Admitting: Orthopedic Surgery

## 2020-08-24 DIAGNOSIS — I10 Essential (primary) hypertension: Secondary | ICD-10-CM | POA: Diagnosis not present

## 2020-08-24 DIAGNOSIS — E11610 Type 2 diabetes mellitus with diabetic neuropathic arthropathy: Secondary | ICD-10-CM | POA: Diagnosis not present

## 2020-08-24 DIAGNOSIS — D649 Anemia, unspecified: Secondary | ICD-10-CM | POA: Diagnosis not present

## 2020-08-24 DIAGNOSIS — Z4781 Encounter for orthopedic aftercare following surgical amputation: Secondary | ICD-10-CM | POA: Diagnosis not present

## 2020-08-24 DIAGNOSIS — U071 COVID-19: Secondary | ICD-10-CM | POA: Diagnosis not present

## 2020-08-24 DIAGNOSIS — E1140 Type 2 diabetes mellitus with diabetic neuropathy, unspecified: Secondary | ICD-10-CM | POA: Diagnosis not present

## 2020-08-25 NOTE — Telephone Encounter (Signed)
ERROR

## 2020-08-27 ENCOUNTER — Encounter: Payer: Self-pay | Admitting: Orthopedic Surgery

## 2020-08-27 ENCOUNTER — Ambulatory Visit (INDEPENDENT_AMBULATORY_CARE_PROVIDER_SITE_OTHER): Payer: Medicare Other | Admitting: Orthopedic Surgery

## 2020-08-27 ENCOUNTER — Other Ambulatory Visit: Payer: Self-pay

## 2020-08-27 DIAGNOSIS — Z89432 Acquired absence of left foot: Secondary | ICD-10-CM

## 2020-08-27 NOTE — Progress Notes (Signed)
Office Visit Note   [name]           Date of Birth: 02/24/1945           MRN: [id number] Visit Date: 08/20/2020              Requested by: Vivi Barrack, [address] [place],  [place] [id number] PCP: Vivi Barrack, MD  Chief Complaint  Patient presents with   Left Foot - Routine Post Op    08/12/20 left foot transmet  amputation       HPI: Patient is a 75 year old woman who presents 1 week status post left transmetatarsal amputation.  Assessment & Plan: Visit Diagnoses:  1. 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[place])     Plan: Recommended elevation range of motion exercises of the ankle a 2 XL stump shrinker.  Follow-Up Instructions: Return in about 1 week (around 08/27/2020).   Ortho Exam  Patient is alert, oriented, no adenopathy, well-dressed, normal affect, normal respiratory effort. Examination there is swelling the incision is gaping open there is no cellulitis no odor no drainage.  Imaging: No results found. No images are attached to the encounter.  Labs: Lab Results  Component Value Date   HGBA1C 8.1 (H) 08/12/2020   HGBA1C 8.4 (H) 05/29/2020   HGBA1C 8.0 (A) 03/18/2020   ESRSEDRATE 51 (H) 12/24/2019   ESRSEDRATE 36 (H) 02/24/2017   CRP 13.2 (H) 12/24/2019   CRP 1.2 (H) 02/24/2017   LABURIC 5.5 09/01/2015   REPTSTATUS 06/03/2020 FINAL 05/29/2020   CULT  05/29/2020    NO GROWTH 5 DAYS Performed at [facility], [place] [address], [place],  [address]    LABORGA GROUP B STREP (S.AGALACTIAE) ISOLATED 12/26/2014     Lab Results  Component Value Date   ALBUMIN 3.4 (L) 05/29/2020   ALBUMIN 3.6 12/26/2019   ALBUMIN 4.0 12/19/2019   PREALBUMIN 19.4 03/05/2012    No results found for: MG No results found for: VD25OH  Lab Results  Component Value Date   PREALBUMIN 19.4 03/05/2012   CBC EXTENDED Latest Ref Rng & Units 08/12/2020 07/02/2020 05/31/2020  WBC 4.0 - 10.5 K/uL 9.8 11.7(H) 8.9  RBC 3.87 - 5.11  MIL/uL 4.02 4.18 3.92  HGB 12.0 - 15.0 g/dL 10.8(L) 11.1(L) 10.5(L)  HCT 36.0 - 46.0 % 34.4(L) 35.5(L) 33.5(L)  PLT 150 - 400 K/uL 403(H) 383 382  NEUTROABS 1.7 - 7.7 K/uL - 7.8(H) -  LYMPHSABS 0.7 - 4.0 K/uL - 2.6 -     There is no height or weight on file to calculate BMI.  Orders:  No orders of the defined types were placed in this encounter.  No orders of the defined types were placed in this encounter.    Procedures: No procedures performed  Clinical Data: No additional findings.  ROS:  All other systems negative, except as noted in the HPI. Review of Systems  Objective: Vital Signs: There were no vitals taken for this visit.  Specialty Comments:  No specialty comments available.  PMFS History: Patient Active Problem List   Diagnosis Date Noted   Abscess of left foot 08/12/2020   Memory loss 11/21/2019   Mild nonproliferative diabetic retinopathy of both eyes ([place]) 10/23/2019   Nuclear sclerotic cataract of both eyes 10/23/2019   Posterior vitreous detachment of right eye 10/23/2019   Retinal hemorrhage of left eye 10/23/2019   Hyperlipidemia associated with type 2 diabetes mellitus ([place]) 11/30/2017   Impingement syndrome of  right shoulder 07/06/2017   Iron deficiency anemia 05/11/2017   Morbid obesity ([place]) 04/24/2017   Subacute osteomyelitis, left ankle and foot ([place])    Anemia 02/24/2017   Baker's cyst 07/10/2016   Onychomycosis 12/07/2015   Upper airway cough syndrome 03/05/2014   History of amputation of lesser toe of right foot ([place]) 04/15/2013   Hypertension associated with diabetes ([place]) 12/05/2006   Uncontrolled type 2 diabetes mellitus with peripheral circulatory disorder ([place]) 10/12/2006   Dyslipidemia associated with type 2 diabetes mellitus ([place]) 10/12/2006   Allergic rhinitis 10/12/2006   GERD - Followed by Dr. Fuller Plan 10/12/2006   IRRITABLE BOWEL SYNDROME - followed by Dr. Fuller Plan 10/12/2006   Peripheral neuropathy 10/11/2006   ALOPECIA NEC  10/11/2006   Past Medical History:  Diagnosis Date   Alopecia    Anemia, mild    Arthritis    Chronic cough    sees pulmonologist   Chronic pain    chest wall and abd - s/p extensive eval   Diabetes mellitus with neuropathy (HCC)    sees endocrine   Diverticulosis    Dyspnea    Fatty liver    GERD (gastroesophageal reflux disease)    takes Nexium bid, hx erosive esophagitis   Headache(784.0)    occasionally;r/t sinus    History of colon polyps    HTN (hypertension)    Hx of amputation of lesser toe (HCC)    sees podiatrist   Hyperlipemia    IBS (irritable bowel syndrome)    Insomnia    takes Elavil nightly   Joint pain    Neuropathy    Neuropathy    Osteomyelitis ([place])    Pneumonia    89/2/22- patient denies   PONV (postoperative nausea and vomiting)    medication did not help with surgery on 03/28/20   Seasonal allergies    takes Zyrtec daily   Sinus tachycardia     Family History  Problem Relation Age of Onset   Lung cancer Mother 54       smoked heavily   Emphysema Mother    Hypertension Father    Hyperlipidemia Father    Diabetes Father    Coronary artery disease Father    Dementia Father    COPD Father        smoked   Stomach cancer Paternal Aunt    Brain cancer Paternal Uncle    Irritable bowel syndrome Other        Several family members on fathers side    Diabetes Other    Stomach cancer Maternal Aunt    Lung cancer Paternal Uncle    Heart disease Paternal Uncle    Colon cancer Neg Hx     Past Surgical History:  Procedure Laterality Date   AMPUTATION  12/28/2011   Procedure: AMPUTATION DIGIT;  Surgeon: Newt Minion, MD;  Location: [facility];  Service: Orthopedics;  Laterality: Right;  Right Foot 2nd Toe Amputation at MTP (metatarsophalangeal joint)   AMPUTATION Right 04/25/2012   Procedure: Right Foot 3rd Toe Amputation;  Surgeon: Newt Minion, MD;  Location: [facility];  Service: Orthopedics;  Laterality: Right;  Right Foot Third Toe Amputation     AMPUTATION Right 07/27/2012   Procedure: Right 4th Toe Amputation at Metatarsophalangeal;  Surgeon: Newt Minion, MD;  Location: [facility];  Service: Orthopedics;  Laterality: Right;  Right 4th Toe Amputation at Metatarsophalangeal   AMPUTATION Left 07/15/2016   Procedure: Left 2nd Ray Amputation;  Surgeon: Newt Minion, MD;  Location: [facility];  Service: Orthopedics;  Laterality: Left;   AMPUTATION Left 11/18/2016   Procedure: Left 3rd and 4th Ray Amputation;  Surgeon: Newt Minion, MD;  Location: [facility];  Service: Orthopedics;  Laterality: Left;   AMPUTATION Right 03/29/2017   Procedure: RIGHT FOOT 3RD AND 4TH RAY AMPUTATION;  Surgeon: Newt Minion, MD;  Location: [facility];  Service: Orthopedics;  Laterality: Right;   AMPUTATION Left 08/12/2020   Procedure: LEFT TRANSMETATARSAL AMPUTATION;  Surgeon: Newt Minion, MD;  Location: [facility];  Service: Orthopedics;  Laterality: Left;   APPLICATION OF WOUND VAC Left 08/12/2020   Procedure: APPLICATION OF WOUND VAC;  Surgeon: Newt Minion, MD;  Location: [facility];  Service: Orthopedics;  Laterality: Left;   COLONOSCOPY     LAPAROSCOPIC APPENDECTOMY  01/05/2011   Procedure: APPENDECTOMY LAPAROSCOPIC;  Surgeon: Pedro Earls, MD;  Location: [facility];  Service: General;  Laterality: N/A;   OOPHORECTOMY  2001   ROTATOR CUFF REPAIR Right    x 2   TUBAL LIGATION     VAGINAL HYSTERECTOMY  2001   Social History   Occupational History   Occupation: Retired   Tobacco Use   Smoking status: Never   Smokeless tobacco: Never  Vaping Use   Vaping Use: Never used  Substance and Sexual Activity   Alcohol use: Never   Drug use: Never   Sexual activity: Yes    Birth control/protection: Surgical

## 2020-08-27 NOTE — Progress Notes (Signed)
Office Visit Note   [name]           Date of Birth: [birth year]           MRN: [id number] Visit Date: 08/27/2020              Requested by: Vivi Barrack, [address] [place],  [place] [address] PCP: Vivi Barrack, MD  Chief Complaint  Patient presents with   Left Foot - Routine Post Op    08/12/20 left foot trasnmet amputation       HPI: Patient is a 75 year old woman who presents approximately 2 weeks status post left transmetatarsal amputation she states she has had some bleeding swelling.  She states she does elevate her foot most of the time.  Assessment & Plan: Visit Diagnoses:  1. 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[place])     Plan: We will have her continue with Dial soap cleansing elevation and compression.  Follow-up in 1 week for evaluation for suture removal.  Follow-Up Instructions: Return in about 1 week (around 09/03/2020).   Ortho Exam  Patient is alert, oriented, no adenopathy, well-dressed, normal affect, normal respiratory effort. Examination incision is well approximated there is a slight 1 mm gapping of the incision medially with healthy granulation tissue within the wound bed there is superficial epithelialization of most of the incision without cellulitis.  Imaging: No results found. No images are attached to the encounter.  Labs: Lab Results  Component Value Date   HGBA1C 8.1 (H) 08/12/2020   HGBA1C 8.4 (H) 05/29/2020   HGBA1C 8.0 (A) 03/18/2020   ESRSEDRATE 51 (H) 12/24/2019   ESRSEDRATE 36 (H) 02/24/2017   CRP 13.2 (H) 12/24/2019   CRP 1.2 (H) 02/24/2017   LABURIC 5.5 09/01/2015   REPTSTATUS 06/03/2020 FINAL 05/29/2020   CULT  05/29/2020    NO GROWTH 5 DAYS Performed at [facility], [place] [address], [place], [place] [address]    LABORGA GROUP B STREP (S.AGALACTIAE) ISOLATED 12/26/2014     Lab Results  Component Value Date   ALBUMIN 3.4 (L) 05/29/2020   ALBUMIN 3.6 12/26/2019   ALBUMIN 4.0  12/19/2019   PREALBUMIN 19.4 03/05/2012    No results found for: MG No results found for: VD25OH  Lab Results  Component Value Date   PREALBUMIN 19.4 03/05/2012   CBC EXTENDED Latest Ref Rng & Units 08/12/2020 07/02/2020 05/31/2020  WBC 4.0 - 10.5 K/uL 9.8 11.7(H) 8.9  RBC 3.87 - 5.11 MIL/uL 4.02 4.18 3.92  HGB 12.0 - 15.0 g/dL 10.8(L) 11.1(L) 10.5(L)  HCT 36.0 - 46.0 % 34.4(L) 35.5(L) 33.5(L)  PLT 150 - 400 K/uL 403(H) 383 382  NEUTROABS 1.7 - 7.7 K/uL - 7.8(H) -  LYMPHSABS 0.7 - 4.0 K/uL - 2.6 -     There is no height or weight on file to calculate BMI.  Orders:  No orders of the defined types were placed in this encounter.  No orders of the defined types were placed in this encounter.    Procedures: No procedures performed  Clinical Data: No additional findings.  ROS:  All other systems negative, except as noted in the HPI. Review of Systems  Objective: Vital Signs: There were no vitals taken for this visit.  Specialty Comments:  No specialty comments available.  PMFS History: Patient Active Problem List   Diagnosis Date Noted   Abscess of left foot 08/12/2020   Memory loss 11/21/2019   Mild nonproliferative diabetic retinopathy of  both eyes ([place]) 10/23/2019   Nuclear sclerotic cataract of both eyes 10/23/2019   Posterior vitreous detachment of right eye 10/23/2019   Retinal hemorrhage of left eye 10/23/2019   Hyperlipidemia associated with type 2 diabetes mellitus ([place]) 11/30/2017   Impingement syndrome of right shoulder 07/06/2017   Iron deficiency anemia 05/11/2017   Morbid obesity ([place]) 04/24/2017   Subacute osteomyelitis, left ankle and foot ([place])    Anemia 02/24/2017   Baker's cyst 07/10/2016   Onychomycosis 12/07/2015   Upper airway cough syndrome 03/05/2014   History of amputation of lesser toe of right foot ([place]) 04/15/2013   Hypertension associated with diabetes ([place]) 12/05/2006   Uncontrolled type 2 diabetes mellitus with peripheral  circulatory disorder ([place]) 10/12/2006   Dyslipidemia associated with type 2 diabetes mellitus ([place]) 10/12/2006   Allergic rhinitis 10/12/2006   GERD - Followed by Dr. Fuller Plan 10/12/2006   IRRITABLE BOWEL SYNDROME - followed by Dr. Fuller Plan 10/12/2006   Peripheral neuropathy 10/11/2006   ALOPECIA NEC 10/11/2006   Past Medical History:  Diagnosis Date   Alopecia    Anemia, mild    Arthritis    Chronic cough    sees pulmonologist   Chronic pain    chest wall and abd - s/p extensive eval   Diabetes mellitus with neuropathy (HCC)    sees endocrine   Diverticulosis    Dyspnea    Fatty liver    GERD (gastroesophageal reflux disease)    takes Nexium bid, hx erosive esophagitis   Headache(784.0)    occasionally;r/t sinus    History of colon polyps    HTN (hypertension)    Hx of amputation of lesser toe (HCC)    sees podiatrist   Hyperlipemia    IBS (irritable bowel syndrome)    Insomnia    takes Elavil nightly   Joint pain    Neuropathy    Neuropathy    Osteomyelitis ([place])    Pneumonia    89/2/22- patient denies   PONV (postoperative nausea and vomiting)    medication did not help with surgery on 03/28/20   Seasonal allergies    takes Zyrtec daily   Sinus tachycardia     Family History  Problem Relation Age of Onset   Lung cancer Mother 99       smoked heavily   Emphysema Mother    Hypertension Father    Hyperlipidemia Father    Diabetes Father    Coronary artery disease Father    Dementia Father    COPD Father        smoked   Stomach cancer Paternal Aunt    Brain cancer Paternal Uncle    Irritable bowel syndrome Other        Several family members on fathers side    Diabetes Other    Stomach cancer Maternal Aunt    Lung cancer Paternal Uncle    Heart disease Paternal Uncle    Colon cancer Neg Hx     Past Surgical History:  Procedure Laterality Date   AMPUTATION  12/28/2011   Procedure: AMPUTATION DIGIT;  Surgeon: Newt Minion, MD;  Location: [facility];  Service:  Orthopedics;  Laterality: Right;  Right Foot 2nd Toe Amputation at MTP (metatarsophalangeal joint)   AMPUTATION Right 04/25/2012   Procedure: Right Foot 3rd Toe Amputation;  Surgeon: Newt Minion, MD;  Location: [facility];  Service: Orthopedics;  Laterality: Right;  Right Foot Third Toe Amputation    AMPUTATION Right 07/27/2012   Procedure: Right  4th Toe Amputation at Metatarsophalangeal;  Surgeon: Newt Minion, MD;  Location: [facility];  Service: Orthopedics;  Laterality: Right;  Right 4th Toe Amputation at Metatarsophalangeal   AMPUTATION Left 07/15/2016   Procedure: Left 2nd Ray Amputation;  Surgeon: Newt Minion, MD;  Location: [facility];  Service: Orthopedics;  Laterality: Left;   AMPUTATION Left 11/18/2016   Procedure: Left 3rd and 4th Ray Amputation;  Surgeon: Newt Minion, MD;  Location: [facility];  Service: Orthopedics;  Laterality: Left;   AMPUTATION Right 03/29/2017   Procedure: RIGHT FOOT 3RD AND 4TH RAY AMPUTATION;  Surgeon: Newt Minion, MD;  Location: [facility];  Service: Orthopedics;  Laterality: Right;   AMPUTATION Left 08/12/2020   Procedure: LEFT TRANSMETATARSAL AMPUTATION;  Surgeon: Newt Minion, MD;  Location: [facility];  Service: Orthopedics;  Laterality: Left;   APPLICATION OF WOUND VAC Left 08/12/2020   Procedure: APPLICATION OF WOUND VAC;  Surgeon: Newt Minion, MD;  Location: [facility];  Service: Orthopedics;  Laterality: Left;   COLONOSCOPY     LAPAROSCOPIC APPENDECTOMY  01/05/2011   Procedure: APPENDECTOMY LAPAROSCOPIC;  Surgeon: Pedro Earls, MD;  [facility];  Service: General;  Laterality: N/A;   OOPHORECTOMY  2001   ROTATOR CUFF REPAIR Right    x 2   TUBAL LIGATION     VAGINAL HYSTERECTOMY  2001   Social History   Occupational History   Occupation: Retired   Tobacco Use   Smoking status: Never   Smokeless tobacco: Never  Vaping Use   Vaping Use: Never used  Substance and Sexual Activity   Alcohol use: Never   Drug use: Never   Sexual activity: Yes    Birth  control/protection: Surgical

## 2020-08-28 DIAGNOSIS — I10 Essential (primary) hypertension: Secondary | ICD-10-CM | POA: Diagnosis not present

## 2020-08-28 DIAGNOSIS — U071 COVID-19: Secondary | ICD-10-CM | POA: Diagnosis not present

## 2020-08-28 DIAGNOSIS — E1140 Type 2 diabetes mellitus with diabetic neuropathy, unspecified: Secondary | ICD-10-CM | POA: Diagnosis not present

## 2020-08-28 DIAGNOSIS — Z4781 Encounter for orthopedic aftercare following surgical amputation: Secondary | ICD-10-CM | POA: Diagnosis not present

## 2020-08-28 DIAGNOSIS — D649 Anemia, unspecified: Secondary | ICD-10-CM | POA: Diagnosis not present

## 2020-08-28 DIAGNOSIS — E11610 Type 2 diabetes mellitus with diabetic neuropathic arthropathy: Secondary | ICD-10-CM | POA: Diagnosis not present

## 2020-09-01 DIAGNOSIS — U071 COVID-19: Secondary | ICD-10-CM | POA: Diagnosis not present

## 2020-09-01 DIAGNOSIS — E11610 Type 2 diabetes mellitus with diabetic neuropathic arthropathy: Secondary | ICD-10-CM | POA: Diagnosis not present

## 2020-09-01 DIAGNOSIS — E1140 Type 2 diabetes mellitus with diabetic neuropathy, unspecified: Secondary | ICD-10-CM | POA: Diagnosis not present

## 2020-09-01 DIAGNOSIS — D649 Anemia, unspecified: Secondary | ICD-10-CM | POA: Diagnosis not present

## 2020-09-01 DIAGNOSIS — Z4781 Encounter for orthopedic aftercare following surgical amputation: Secondary | ICD-10-CM | POA: Diagnosis not present

## 2020-09-01 DIAGNOSIS — I10 Essential (primary) hypertension: Secondary | ICD-10-CM | POA: Diagnosis not present

## 2020-09-03 ENCOUNTER — Other Ambulatory Visit: Payer: Self-pay

## 2020-09-03 ENCOUNTER — Ambulatory Visit (INDEPENDENT_AMBULATORY_CARE_PROVIDER_SITE_OTHER): Payer: Medicare Other | Admitting: Orthopedic Surgery

## 2020-09-03 ENCOUNTER — Encounter: Payer: Self-pay | Admitting: Orthopedic Surgery

## 2020-09-03 DIAGNOSIS — E1140 Type 2 diabetes mellitus with diabetic neuropathy, unspecified: Secondary | ICD-10-CM | POA: Diagnosis not present

## 2020-09-03 DIAGNOSIS — I10 Essential (primary) hypertension: Secondary | ICD-10-CM | POA: Diagnosis not present

## 2020-09-03 DIAGNOSIS — E11610 Type 2 diabetes mellitus with diabetic neuropathic arthropathy: Secondary | ICD-10-CM | POA: Diagnosis not present

## 2020-09-03 DIAGNOSIS — Z4781 Encounter for orthopedic aftercare following surgical amputation: Secondary | ICD-10-CM | POA: Diagnosis not present

## 2020-09-03 DIAGNOSIS — U071 COVID-19: Secondary | ICD-10-CM | POA: Diagnosis not present

## 2020-09-03 DIAGNOSIS — Z89432 Acquired absence of left foot: Secondary | ICD-10-CM

## 2020-09-03 DIAGNOSIS — D649 Anemia, unspecified: Secondary | ICD-10-CM | POA: Diagnosis not present

## 2020-09-03 NOTE — Progress Notes (Signed)
Office Visit Note   [name]           Date of Birth: 08/11/1945           MRN: [id number] Visit Date: 09/03/2020              Requested by: Vivi Barrack, MD [address] [place],  [place] [address] PCP: Vivi Barrack, MD  Chief Complaint  Patient presents with   Left Foot - Follow-up      HPI: Patient is a 75 year old woman who presents she is 3-week status post left transmetatarsal amputation she still has swelling in the left leg she has painful onychomycotic nails x2 in the right foot.  Assessment & Plan: Visit Diagnoses:  1. 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[place])     Plan: Nails were trimmed x2 on the right foot recommended continue elevation and compression for the left foot.  Harvest sutures at follow-up.  Follow-Up Instructions: Return in about 1 week (around 09/10/2020).   Ortho Exam  Patient is alert, oriented, no adenopathy, well-dressed, normal affect, normal respiratory effort. Examination patient still has swelling in the left lower extremity the majority of the transmetatarsal amputation is completely healed medially the area that gaped open has improved granulation tissue there is no cellulitis.  The wound is 20 x 1 mm and 1 mm deep.  Patient has onychomycotic nails x2 on the right foot and these were trimmed without complications.  She has some calluses on both feet but no ulcers.  Imaging: No results found. No images are attached to the encounter.  Labs: Lab Results  Component Value Date   HGBA1C 8.1 (H) 08/12/2020   HGBA1C 8.4 (H) 05/29/2020   HGBA1C 8.0 (A) 03/18/2020   ESRSEDRATE 51 (H) 12/24/2019   ESRSEDRATE 36 (H) 02/24/2017   CRP 13.2 (H) 12/24/2019   CRP 1.2 (H) 02/24/2017   LABURIC 5.5 09/01/2015   REPTSTATUS 06/03/2020 FINAL 05/29/2020   CULT  05/29/2020    NO GROWTH 5 DAYS Performed at [facility], [place] [address], [place], [place] [address]    LABORGA GROUP B STREP (S.AGALACTIAE) ISOLATED  12/26/2014     Lab Results  Component Value Date   ALBUMIN 3.4 (L) 05/29/2020   ALBUMIN 3.6 12/26/2019   ALBUMIN 4.0 12/19/2019   PREALBUMIN 19.4 03/05/2012    No results found for: MG No results found for: VD25OH  Lab Results  Component Value Date   PREALBUMIN 19.4 03/05/2012   CBC EXTENDED Latest Ref Rng & Units 08/12/2020 07/02/2020 05/31/2020  WBC 4.0 - 10.5 K/uL 9.8 11.7(H) 8.9  RBC 3.87 - 5.11 MIL/uL 4.02 4.18 3.92  HGB 12.0 - 15.0 g/dL 10.8(L) 11.1(L) 10.5(L)  HCT 36.0 - 46.0 % 34.4(L) 35.5(L) 33.5(L)  PLT 150 - 400 K/uL 403(H) 383 382  NEUTROABS 1.7 - 7.7 K/uL - 7.8(H) -  LYMPHSABS 0.7 - 4.0 K/uL - 2.6 -     There is no height or weight on file to calculate BMI.  Orders:  No orders of the defined types were placed in this encounter.  No orders of the defined types were placed in this encounter.    Procedures: No procedures performed  Clinical Data: No additional findings.  ROS:  All other systems negative, except as noted in the HPI. Review of Systems  Objective: Vital Signs: There were no vitals taken for this visit.  Specialty Comments:  No specialty comments available.  PMFS History: Patient Active Problem List  Diagnosis Date Noted   Abscess of left foot 08/12/2020   Memory loss 11/21/2019   Mild nonproliferative diabetic retinopathy of both eyes ([place]) 10/23/2019   Nuclear sclerotic cataract of both eyes 10/23/2019   Posterior vitreous detachment of right eye 10/23/2019   Retinal hemorrhage of left eye 10/23/2019   Hyperlipidemia associated with type 2 diabetes mellitus ([place]) 11/30/2017   Impingement syndrome of right shoulder 07/06/2017   Iron deficiency anemia 05/11/2017   Morbid obesity ([place]) 04/24/2017   Subacute osteomyelitis, left ankle and foot ([place])    Anemia 02/24/2017   Baker's cyst 07/10/2016   Onychomycosis 12/07/2015   Upper airway cough syndrome 03/05/2014   History of amputation of lesser toe of right foot ([place])  04/15/2013   Hypertension associated with diabetes ([place]) 12/05/2006   Uncontrolled type 2 diabetes mellitus with peripheral circulatory disorder ([place]) 10/12/2006   Dyslipidemia associated with type 2 diabetes mellitus ([place]) 10/12/2006   Allergic rhinitis 10/12/2006   GERD - Followed by Dr. Fuller Plan 10/12/2006   IRRITABLE BOWEL SYNDROME - followed by Dr. Fuller Plan 10/12/2006   Peripheral neuropathy 10/11/2006   ALOPECIA NEC 10/11/2006   Past Medical History:  Diagnosis Date   Alopecia    Anemia, mild    Arthritis    Chronic cough    sees pulmonologist   Chronic pain    chest wall and abd - s/p extensive eval   Diabetes mellitus with neuropathy (HCC)    sees endocrine   Diverticulosis    Dyspnea    Fatty liver    GERD (gastroesophageal reflux disease)    takes Nexium bid, hx erosive esophagitis   Headache(784.0)    occasionally;r/t sinus    History of colon polyps    HTN (hypertension)    Hx of amputation of lesser toe (HCC)    sees podiatrist   Hyperlipemia    IBS (irritable bowel syndrome)    Insomnia    takes Elavil nightly   Joint pain    Neuropathy    Neuropathy    Osteomyelitis ([place])    Pneumonia    89/2/22- patient denies   PONV (postoperative nausea and vomiting)    medication did not help with surgery on 03/28/20   Seasonal allergies    takes Zyrtec daily   Sinus tachycardia     Family History  Problem Relation Age of Onset   Lung cancer Mother 59       smoked heavily   Emphysema Mother    Hypertension Father    Hyperlipidemia Father    Diabetes Father    Coronary artery disease Father    Dementia Father    COPD Father        smoked   Stomach cancer Paternal Aunt    Brain cancer Paternal Uncle    Irritable bowel syndrome Other        Several family members on fathers side    Diabetes Other    Stomach cancer Maternal Aunt    Lung cancer Paternal Uncle    Heart disease Paternal Uncle    Colon cancer Neg Hx     Past Surgical History:  Procedure  Laterality Date   AMPUTATION  12/28/2011   Procedure: AMPUTATION DIGIT;  Surgeon: Newt Minion, MD;  Location: [place];  Service: Orthopedics;  Laterality: Right;  Right Foot 2nd Toe Amputation at MTP (metatarsophalangeal joint)   AMPUTATION Right 04/25/2012   Procedure: Right Foot 3rd Toe Amputation;  Surgeon: Newt Minion, MD;  Location: [place];  Service: Orthopedics;  Laterality: Right;  Right Foot Third Toe Amputation    AMPUTATION Right 07/27/2012   Procedure: Right 4th Toe Amputation at Metatarsophalangeal;  Surgeon: Newt Minion, MD;  Location: [place];  Service: Orthopedics;  Laterality: Right;  Right 4th Toe Amputation at Metatarsophalangeal   AMPUTATION Left 07/15/2016   Procedure: Left 2nd Ray Amputation;  Surgeon: Newt Minion, MD;  Location: [place];  Service: Orthopedics;  Laterality: Left;   AMPUTATION Left 11/18/2016   Procedure: Left 3rd and 4th Ray Amputation;  Surgeon: Newt Minion, MD;  Location: [place];  Service: Orthopedics;  Laterality: Left;   AMPUTATION Right 03/29/2017   Procedure: RIGHT FOOT 3RD AND 4TH RAY AMPUTATION;  Surgeon: Newt Minion, MD;  Location: [place];  Service: Orthopedics;  Laterality: Right;   AMPUTATION Left 08/12/2020   Procedure: LEFT TRANSMETATARSAL AMPUTATION;  Surgeon: Newt Minion, MD;  Location: [place];  Service: Orthopedics;  Laterality: Left;   APPLICATION OF WOUND VAC Left 08/12/2020   Procedure: APPLICATION OF WOUND VAC;  Surgeon: Newt Minion, MD;  Location: [place];  Service: Orthopedics;  Laterality: Left;   COLONOSCOPY     LAPAROSCOPIC APPENDECTOMY  01/05/2011   Procedure: APPENDECTOMY LAPAROSCOPIC;  Surgeon: Pedro Earls, MD;  Location[facility];  Service: General;  Laterality: N/A;   OOPHORECTOMY  2001   ROTATOR CUFF REPAIR Right    x 2   TUBAL LIGATION     VAGINAL HYSTERECTOMY  2001   Social History   Occupational History   Occupation: Retired   Tobacco Use   Smoking status: Never   Smokeless tobacco: Never  Vaping Use   Vaping  Use: Never used  Substance and Sexual Activity   Alcohol use: Never   Drug use: Never   Sexual activity: Yes    Birth control/protection: Surgical

## 2020-09-07 ENCOUNTER — Telehealth: Payer: Self-pay | Admitting: Gastroenterology

## 2020-09-07 NOTE — Telephone Encounter (Signed)
Patient reports a  one week hx of pain in back and RUQ.  She reports also has nausea.  She will come in tomorrow and see Ellouise Newer, PA

## 2020-09-07 NOTE — Telephone Encounter (Signed)
Pt has been experiencing pain under her rib cage, and nausea. She would like some advise on what to do.

## 2020-09-08 ENCOUNTER — Ambulatory Visit (INDEPENDENT_AMBULATORY_CARE_PROVIDER_SITE_OTHER): Payer: Medicare Other | Admitting: Physician Assistant

## 2020-09-08 ENCOUNTER — Other Ambulatory Visit (INDEPENDENT_AMBULATORY_CARE_PROVIDER_SITE_OTHER): Payer: Medicare Other

## 2020-09-08 ENCOUNTER — Encounter: Payer: Self-pay | Admitting: Physician Assistant

## 2020-09-08 VITALS — BP 130/58 | HR 73 | Ht 68.0 in | Wt 224.1 lb

## 2020-09-08 DIAGNOSIS — Z4781 Encounter for orthopedic aftercare following surgical amputation: Secondary | ICD-10-CM | POA: Diagnosis not present

## 2020-09-08 DIAGNOSIS — R1011 Right upper quadrant pain: Secondary | ICD-10-CM | POA: Diagnosis not present

## 2020-09-08 DIAGNOSIS — R110 Nausea: Secondary | ICD-10-CM

## 2020-09-08 DIAGNOSIS — Z7984 Long term (current) use of oral hypoglycemic drugs: Secondary | ICD-10-CM

## 2020-09-08 DIAGNOSIS — G4700 Insomnia, unspecified: Secondary | ICD-10-CM

## 2020-09-08 DIAGNOSIS — Z89432 Acquired absence of left foot: Secondary | ICD-10-CM

## 2020-09-08 DIAGNOSIS — K219 Gastro-esophageal reflux disease without esophagitis: Secondary | ICD-10-CM

## 2020-09-08 DIAGNOSIS — J302 Other seasonal allergic rhinitis: Secondary | ICD-10-CM

## 2020-09-08 DIAGNOSIS — Z89422 Acquired absence of other left toe(s): Secondary | ICD-10-CM

## 2020-09-08 DIAGNOSIS — Z8701 Personal history of pneumonia (recurrent): Secondary | ICD-10-CM

## 2020-09-08 DIAGNOSIS — M170 Bilateral primary osteoarthritis of knee: Secondary | ICD-10-CM | POA: Diagnosis not present

## 2020-09-08 DIAGNOSIS — K581 Irritable bowel syndrome with constipation: Secondary | ICD-10-CM | POA: Diagnosis not present

## 2020-09-08 DIAGNOSIS — Z89421 Acquired absence of other right toe(s): Secondary | ICD-10-CM

## 2020-09-08 DIAGNOSIS — G8929 Other chronic pain: Secondary | ICD-10-CM | POA: Diagnosis not present

## 2020-09-08 DIAGNOSIS — K760 Fatty (change of) liver, not elsewhere classified: Secondary | ICD-10-CM | POA: Diagnosis not present

## 2020-09-08 DIAGNOSIS — E785 Hyperlipidemia, unspecified: Secondary | ICD-10-CM | POA: Diagnosis not present

## 2020-09-08 DIAGNOSIS — U071 COVID-19: Secondary | ICD-10-CM | POA: Diagnosis not present

## 2020-09-08 DIAGNOSIS — Z794 Long term (current) use of insulin: Secondary | ICD-10-CM

## 2020-09-08 DIAGNOSIS — Z89431 Acquired absence of right foot: Secondary | ICD-10-CM

## 2020-09-08 DIAGNOSIS — Z86010 Personal history of colonic polyps: Secondary | ICD-10-CM

## 2020-09-08 DIAGNOSIS — Z9181 History of falling: Secondary | ICD-10-CM

## 2020-09-08 DIAGNOSIS — E1140 Type 2 diabetes mellitus with diabetic neuropathy, unspecified: Secondary | ICD-10-CM | POA: Diagnosis not present

## 2020-09-08 DIAGNOSIS — I10 Essential (primary) hypertension: Secondary | ICD-10-CM | POA: Diagnosis not present

## 2020-09-08 DIAGNOSIS — K5780 Diverticulitis of intestine, part unspecified, with perforation and abscess without bleeding: Secondary | ICD-10-CM | POA: Diagnosis not present

## 2020-09-08 DIAGNOSIS — D649 Anemia, unspecified: Secondary | ICD-10-CM | POA: Diagnosis not present

## 2020-09-08 DIAGNOSIS — E11610 Type 2 diabetes mellitus with diabetic neuropathic arthropathy: Secondary | ICD-10-CM | POA: Diagnosis not present

## 2020-09-08 LAB — CBC WITH DIFFERENTIAL/PLATELET
Basophils Absolute: 0.1 10*3/uL (ref 0.0–0.1)
Basophils Relative: 1.1 % (ref 0.0–3.0)
Eosinophils Absolute: 0.3 10*3/uL (ref 0.0–0.7)
Eosinophils Relative: 2.7 % (ref 0.0–5.0)
HCT: 31.3 % — ABNORMAL LOW (ref 36.0–46.0)
Hemoglobin: 10.3 g/dL — ABNORMAL LOW (ref 12.0–15.0)
Lymphocytes Relative: 25.9 % (ref 12.0–46.0)
Lymphs Abs: 3 10*3/uL (ref 0.7–4.0)
MCHC: 32.8 g/dL (ref 30.0–36.0)
MCV: 80 fl (ref 78.0–100.0)
Monocytes Absolute: 0.6 10*3/uL (ref 0.1–1.0)
Monocytes Relative: 5.5 % (ref 3.0–12.0)
Neutro Abs: 7.4 10*3/uL (ref 1.4–7.7)
Neutrophils Relative %: 64.8 % (ref 43.0–77.0)
Platelets: 444 10*3/uL — ABNORMAL HIGH (ref 150.0–400.0)
RBC: 3.91 Mil/uL (ref 3.87–5.11)
RDW: 14.5 % (ref 11.5–15.5)
WBC: 11.4 10*3/uL — ABNORMAL HIGH (ref 4.0–10.5)

## 2020-09-08 LAB — COMPREHENSIVE METABOLIC PANEL
ALT: 6 U/L (ref 0–35)
AST: 11 U/L (ref 0–37)
Albumin: 3.8 g/dL (ref 3.5–5.2)
Alkaline Phosphatase: 41 U/L (ref 39–117)
BUN: 27 mg/dL — ABNORMAL HIGH (ref 6–23)
CO2: 30 mEq/L (ref 19–32)
Calcium: 9.9 mg/dL (ref 8.4–10.5)
Chloride: 98 mEq/L (ref 96–112)
Creatinine, Ser: 0.98 mg/dL (ref 0.40–1.20)
GFR: 57.49 mL/min — ABNORMAL LOW (ref >60.00)
Glucose, Bld: 124 mg/dL — ABNORMAL HIGH (ref 70–99)
Potassium: 3.9 mEq/L (ref 3.5–5.1)
Sodium: 137 mEq/L (ref 135–145)
Total Bilirubin: 0.3 mg/dL (ref 0.2–1.2)
Total Protein: 7.5 g/dL (ref 6.0–8.3)

## 2020-09-08 LAB — LIPASE: Lipase: 15 U/L (ref 11.0–59.0)

## 2020-09-08 MED ORDER — ONDANSETRON 4 MG PO TBDP: 4.0000 mg | ORAL_TABLET | ORAL | 1 refills | Status: DC | PRN

## 2020-09-08 NOTE — Progress Notes (Signed)
Chief Complaint: Right upper quadrant pain and nausea  HPI:    Beth Holden is a 75 year old female with a past medical history as listed below including diabetes, reflux, hypertension, IBS and multiple others, known to Dr. Fuller Plan, who presents to clinic today with a complaint of right upper quadrant pain and nausea.    06/28/2018 CT of abdomen and pelvis with diffuse hepatic steatosis and otherwise normal    09/25/2019 patient seen in clinic by Dr. Fuller Plan for reflux and alternating bowel habits.  At that time as discussed she had a long history of it with alternating constipation and diarrhea but the diarrhea has become more frequent and urgent.  At that time continued on Omeprazole 40 mg twice daily.  Her iron deficiency anemia was noted and recheck CBC and ferritin was ordered told to follow-up with Dr. Rondel Oh.  Told to use MiraLAX as needed for constipation and Dicyclomine 10 mg 3 times daily for abdominal bloating discomfort and urgency.    08/12/2020 CBC with a hemoglobin stable at 10.8.  BMP with stable elevation in creatinine at 1.17.    Today, the patient tells me that for the last week off-and-on she has had a nagging right upper quadrant pain up under her rib cage that often radiates through to her back or sometimes will start in her back and radiate through to the front.  At first she blamed this on sitting in her recliner recently after having a toe amputation, but then started to become nauseous which seems to come and go as well and had a brother who had gallbladder issues, who told her it may be the same.  She rates her pain anywhere from a 3/10 up to a 5/10 but this was so bad last night that she could not get to sleep.  Denies any changes in medication other than the addition of a nutrition supplement and zinc.  Tells me the pain does not necessarily increase with eating but "I have not been eating that much since my surgery".  Tells me it always feels like she needs to have diarrhea but she  has not been.    Denies fever, chills, weight loss, blood in her stool, change in bowel habits, vomiting or symptoms that awaken her from sleep.  Past Medical History:  Diagnosis Date   Alopecia    Anemia, mild    Arthritis    Chronic cough    sees pulmonologist   Chronic pain    chest wall and abd - s/p extensive eval   Diabetes mellitus with neuropathy (HCC)    sees endocrine   Diverticulosis    Dyspnea    Fatty liver    GERD (gastroesophageal reflux disease)    takes Nexium bid, hx erosive esophagitis   Headache(784.0)    occasionally;r/t sinus    History of colon polyps    HTN (hypertension)    Hx of amputation of lesser toe (HCC)    sees podiatrist   Hyperlipemia    IBS (irritable bowel syndrome)    Insomnia    takes Elavil nightly   Joint pain    Neuropathy    Neuropathy    Osteomyelitis ([place])    Pneumonia    89/2/22- patient denies   PONV (postoperative nausea and vomiting)    medication did not help with surgery on 03/28/20   Seasonal allergies    takes Zyrtec daily   Sinus tachycardia     Past Surgical History:  Procedure Laterality Date  AMPUTATION  12/28/2011   Procedure: AMPUTATION DIGIT;  Surgeon: Newt Minion, MD;  Location: [place];  Service: Orthopedics;  Laterality: Right;  Right Foot 2nd Toe Amputation at MTP (metatarsophalangeal joint)   AMPUTATION Right 04/25/2012   Procedure: Right Foot 3rd Toe Amputation;  Surgeon: Newt Minion, MD;  Location: [place];  Service: Orthopedics;  Laterality: Right;  Right Foot Third Toe Amputation    AMPUTATION Right 07/27/2012   Procedure: Right 4th Toe Amputation at Metatarsophalangeal;  Surgeon: Newt Minion, MD;  Location: [place];  Service: Orthopedics;  Laterality: Right;  Right 4th Toe Amputation at Metatarsophalangeal   AMPUTATION Left 07/15/2016   Procedure: Left 2nd Ray Amputation;  Surgeon: Newt Minion, MD;  Location: [place];  Service: Orthopedics;  Laterality: Left;   AMPUTATION Left 11/18/2016   Procedure:  Left 3rd and 4th Ray Amputation;  Surgeon: Newt Minion, MD;  Location: [facility];  Service: Orthopedics;  Laterality: Left;   AMPUTATION Right 03/29/2017   Procedure: RIGHT FOOT 3RD AND 4TH RAY AMPUTATION;  Surgeon: Newt Minion, MD;  Location: [place];  Service: Orthopedics;  Laterality: Right;   AMPUTATION Left 08/12/2020   Procedure: LEFT TRANSMETATARSAL AMPUTATION;  Surgeon: Newt Minion, MD;  Location: [facility];  Service: Orthopedics;  Laterality: Left;   APPLICATION OF WOUND VAC Left 08/12/2020   Procedure: APPLICATION OF WOUND VAC;  Surgeon: Newt Minion, MD;  Location: [facility];  Service: Orthopedics;  Laterality: Left;   COLONOSCOPY     LAPAROSCOPIC APPENDECTOMY  01/05/2011   Procedure: APPENDECTOMY LAPAROSCOPIC;  Surgeon: Pedro Earls, MD;  [facility];  Service: General;  Laterality: N/A;   OOPHORECTOMY  2001   ROTATOR CUFF REPAIR Right    x 2   TUBAL LIGATION     VAGINAL HYSTERECTOMY  2001    Current Outpatient Medications  Medication Sig Dispense Refill   acetaminophen (TYLENOL) 500 MG tablet Take 500 mg by mouth every 8 (eight) hours as needed for mild pain.      azelastine (ASTELIN) 0.1 % nasal spray Place 2 sprays into both nostrils 2 (two) times daily. (Patient taking differently: Place 2 sprays into both nostrils 2 (two) times daily as needed for rhinitis or allergies.) 30 mL 12   benzonatate (TESSALON) 100 MG capsule Take 1 capsule (100 mg total) by mouth 2 (two) times daily as needed for cough. 20 capsule 0   betamethasone dipropionate (DIPROLENE) 0.05 % cream Apply 1 application topically 2 (two) times daily as needed (IRRITATION). 30 g 0   Blood Glucose Monitoring Suppl (ONE TOUCH ULTRA 2) w/Device KIT Use to check blood sugar (Patient taking differently: 1 each by Other route daily.) 1 each 0   cetirizine (ZYRTEC) 10 MG tablet Take 1 tablet (10 mg total) by mouth daily. 90 tablet 0   cholecalciferol (VITAMIN D) 1000 units tablet Take 1,000 Units by mouth daily.      dapagliflozin propanediol (FARXIGA) 5 MG TABS tablet Take 1 tablet (5 mg total) by mouth daily before breakfast. 30 tablet 11   dicyclomine (BENTYL) 10 MG capsule Take 1 capsule (10 mg total) by mouth 3 (three) times daily as needed for spasms. 90 capsule 5   Fluocinolone Acetonide 0.01 % OIL Use 1-2 drops daily as needed. (Patient taking differently: Place 1-2 drops into both ears daily as needed (ear irritation).) 20 mL 0   gabapentin (NEURONTIN) 100 MG capsule TAKE 2 TO 3 CAPSULES AT BEDTIME (Patient taking differently: Take  200-300 mg by mouth at bedtime.) 180 capsule 3   glucagon (GLUCAGEN) 1 MG SOLR injection Inject 1 mg into the muscle once as needed for up to 1 dose for low blood sugar. 1 each 11   insulin NPH Human (NOVOLIN N RELION) 100 UNIT/ML injection Inject 0.15-0.2 mLs (15-20 Units total) into the skin See admin instructions. Inject under skin 12-15 units in am  and 10-15 units at bedtime per sliding scale (Patient taking differently: Inject 15-20 Units into the skin See admin instructions. Inject under skin 12-15 units in am  and 10-15 units at bedtime per sliding scale)     insulin regular (NOVOLIN R RELION) 100 units/mL injection Inject 0.2-0.3 mLs (20-30 Units total) into the skin 3 (three) times daily before meals. Per sliding scale     Insulin Syringe-Needle U-100 (RELION INSULIN SYRINGE 1ML/31G) 31G X 5/16" 1 ML MISC USE 2 TIMES A DAY (Patient taking differently: 1 each by Other route 2 (two) times daily.) 100 each 2   irbesartan-hydrochlorothiazide (AVALIDE) 150-12.5 MG tablet Take 1 tablet by mouth daily. 90 tablet 3   metFORMIN (GLUCOPHAGE) 1000 MG tablet Take 1 tablet (1,000 mg total) by mouth 2 (two) times daily with a meal. 180 tablet 3   metoprolol succinate (TOPROL-XL) 25 MG 24 hr tablet Take 1 tablet (25 mg total) by mouth daily. 90 tablet 3   nutrition supplement, JUVEN, (JUVEN) PACK Take 1 packet by mouth 2 (two) times daily between meals.  0   omeprazole (PRILOSEC) 40 MG  capsule Take 1 capsule (40 mg total) by mouth 2 (two) times daily. 180 capsule 3   ONETOUCH ULTRA test strip USE  STRIP TO CHECK GLUCOSE THREE TIMES DAILY 300 each 0   OVER THE COUNTER MEDICATION Apply 1-3 application topically at bedtime as needed (foot pain). TOPRICIN FOOT CREAM - NEUROPATHY FOOT CREAM **APPLIES TO BOTH FEET AT BEDTIME**     oxyCODONE (OXY IR/ROXICODONE) 5 MG immediate release tablet Take 1-2 tablets (5-10 mg total) by mouth every 4 (four) hours as needed for moderate pain (pain score 4-6). 30 tablet 0   Probiotic Product (PROBIOTIC DAILY PO) Take 1 capsule by mouth daily.     rosuvastatin (CRESTOR) 20 MG tablet Take 1 tablet (20 mg total) by mouth daily. 90 tablet 3   vitamin B-12 (CYANOCOBALAMIN) 1000 MCG tablet Take 1,000 mcg daily by mouth.     vitamin C (ASCORBIC ACID) 500 MG tablet Take 500 mg by mouth daily.     zinc sulfate 220 (50 Zn) MG capsule Take 1 capsule (220 mg total) by mouth daily.     No current facility-administered medications for this visit.    Allergies as of 09/08/2020 - Review Complete 09/03/2020  Allergen Reaction Noted   Doxycycline calcium  06/02/2020   Codeine Other (See Comments) 10/31/2007   Propoxyphene hcl Itching 10/31/2007    Family History  Problem Relation Age of Onset   Lung cancer Mother 30       smoked heavily   Emphysema Mother    Hypertension Father    Hyperlipidemia Father    Diabetes Father    Coronary artery disease Father    Dementia Father    COPD Father        smoked   Stomach cancer Paternal Aunt    Brain cancer Paternal Uncle    Irritable bowel syndrome Other        Several family members on fathers side    Diabetes Other  Stomach cancer Maternal Aunt    Lung cancer Paternal Uncle    Heart disease Paternal Uncle    Colon cancer Neg Hx     Social History   Socioeconomic History   Marital status: Married    Spouse name: Not on file   Number of children: 2   Years of education: Not on file   Highest  education level: Not on file  Occupational History   Occupation: Retired   Tobacco Use   Smoking status: Never   Smokeless tobacco: Never  Vaping Use   Vaping Use: Never used  Substance and Sexual Activity   Alcohol use: Never   Drug use: Never   Sexual activity: Yes    Birth control/protection: Surgical  Other Topics Concern   Not on file  Social History Narrative   Caffeine daily    [employer], [employer] no diploma   Married '66   1 dtr- '78; 1 son '71; 2 grandchildren   Occupation: retired 04   Dad with alzheimers-had to place in [place] (summer '10)         Social Determinants of Radio broadcast assistant Strain: Not on Art therapist Insecurity: Not on file  Transportation Needs: Not on file  Physical Activity: Not on file  Stress: Not on file  Social Connections: Not on file  Intimate Partner Violence: Not on file    Review of Systems:    Constitutional: No weight loss, fever, chills, weakness or fatigue Cardiovascular: No chest pain Respiratory: No SOB Gastrointestinal: See HPI and otherwise negative   Physical Exam:  Vital signs: BP (!) 130/58   Pulse 73   Ht '5\' 8"'  (1.727 m)   Wt 224 lb 2 oz (101.7 kg)   BMI 34.08 kg/m    Constitutional:   Pleasant overweight Caucasian female appears to be in NAD, Well developed, Well nourished, alert and cooperative Respiratory: Respirations even and unlabored. Lungs clear to auscultation bilaterally.   No wheezes, crackles, or rhonchi.  Cardiovascular: Normal S1, S2. No MRG. Regular rate and rhythm. No peripheral edema, cyanosis or pallor.  Gastrointestinal:  Soft, nondistended, moderate right upper quadrant TTP to deep palpation with involuntary guarding, normal bowel sounds. No appreciable masses or hepatomegaly. Rectal:  Not performed.  Msk:  Symmetrical without gross deformities. Without edema, no deformity or joint abnormality. Left foot in boot, ambulating in wheelchair Psychiatric:  Demonstrates good judgement and reason  without abnormal affect or behaviors.  RELEVANT LABS AND IMAGING: CBC    Component Value Date/Time   WBC 9.8 08/12/2020 1115   RBC 4.02 08/12/2020 1115   HGB 10.8 (L) 08/12/2020 1115   HGB 11.1 (L) 07/02/2020 0755   HCT 34.4 (L) 08/12/2020 1115   PLT 403 (H) 08/12/2020 1115   PLT 383 07/02/2020 0755   MCV 85.6 08/12/2020 1115   MCH 26.9 08/12/2020 1115   MCHC 31.4 08/12/2020 1115   RDW 14.0 08/12/2020 1115   LYMPHSABS 2.6 07/02/2020 0755   MONOABS 0.9 07/02/2020 0755   EOSABS 0.3 07/02/2020 0755   BASOSABS 0.1 07/02/2020 0755    CMP     Component Value Date/Time   NA 136 08/12/2020 1115   K 4.6 08/12/2020 1115   CL 101 08/12/2020 1115   CO2 27 08/12/2020 1115   GLUCOSE 190 (H) 08/12/2020 1115   BUN 18 08/12/2020 1115   CREATININE 1.17 (H) 08/12/2020 1115   CREATININE 0.97 (H) 12/24/2019 1357   CALCIUM 9.6 08/12/2020 1115   PROT 7.4 05/29/2020  1149   ALBUMIN 3.4 (L) 05/29/2020 1149   AST 13 (L) 05/29/2020 1149   AST 20 05/19/2017 1538   ALT 10 05/29/2020 1149   ALT 20 05/19/2017 1538   ALKPHOS 48 05/29/2020 1149   BILITOT 0.8 05/29/2020 1149   BILITOT 0.3 05/19/2017 1538   GFRNONAA 50 (L) 08/12/2020 1115   GFRNONAA 67 11/23/2018 0850   GFRAA 78 11/23/2018 0850    Assessment: 1.  Right upper quadrant pain: Started a week ago intermittent, now more constant with nausea; consider gallbladder etiology versus gastritis versus IBS 2.  Nausea  Plan: 1.  Ordered CBC, CMP and lipase. 2.  Ordered urgent right upper quadrant ultrasound. 3.  Prescribe Zofran 4 mg ODT 1-2 tabs every 4-6 hours as needed for nausea #30 with 1 refill. 4.  Patient to follow in clinic for recommendations after imaging and labs above.  Ellouise Newer, PA-C [facility] 09/08/2020, 1:54 PM  Cc: Vivi Barrack, MD

## 2020-09-08 NOTE — Progress Notes (Signed)
Reviewed and agree with management plan.  Urbano Milhouse T. Davian Hanshaw, MD FACG 

## 2020-09-08 NOTE — Patient Instructions (Addendum)
Your provider has requested that you go to the basement level for lab work before leaving today. Press "B" on the elevator. The lab is located at the first door on the left as you exit the elevator.  We have sent the following medications to your pharmacy for you to pick up at your convenience: Zofran 4 mg ODT 1-2 tablets every 4-6 hours as needed for nausea.  You have been scheduled for an abdominal ultrasound at [facility] (1st floor of hospital) on Wednesday 09/09/20 at 10 am. Please arrive 15 minutes prior to your appointment for registration. Make certain not to have anything to eat or drink 6 hours prior to your appointment. Should you need to reschedule your appointment, please contact radiology at [phone]. This test typically takes about 30 minutes to perform.  If you are age 3 or older, your body mass index should be between 23-30. Your Body mass index is 34.08 kg/m. If this is out of the aforementioned range listed, please consider follow up with your Primary Care Provider.  If you are age 50 or younger, your body mass index should be between 19-25. Your Body mass index is 34.08 kg/m. If this is out of the aformentioned range listed, please consider follow up with your Primary Care Provider.   __________________________________________________________  The [facility] providers would like to encourage you to use [facility] to communicate with providers for non-urgent requests or questions.  Due to long hold times on the telephone, sending your provider a message by [facility] may be a faster and more efficient way to get a response.  Please allow 48 business hours for a response.  Please remember that this is for non-urgent requests.

## 2020-09-09 ENCOUNTER — Ambulatory Visit (HOSPITAL_COMMUNITY)
Admission: RE | Admit: 2020-09-09 | Discharge: 2020-09-09 | Disposition: A | Discharge status: Home / Self Care | Payer: Medicare Other | Source: Ambulatory Visit | Attending: Physician Assistant | Admitting: Physician Assistant

## 2020-09-09 ENCOUNTER — Other Ambulatory Visit: Payer: Self-pay

## 2020-09-09 DIAGNOSIS — R1011 Right upper quadrant pain: Secondary | ICD-10-CM | POA: Diagnosis not present

## 2020-09-09 DIAGNOSIS — R110 Nausea: Secondary | ICD-10-CM | POA: Insufficient documentation

## 2020-09-09 DIAGNOSIS — K760 Fatty (change of) liver, not elsewhere classified: Secondary | ICD-10-CM | POA: Diagnosis not present

## 2020-09-10 ENCOUNTER — Ambulatory Visit (INDEPENDENT_AMBULATORY_CARE_PROVIDER_SITE_OTHER): Payer: Medicare Other | Admitting: Orthopedic Surgery

## 2020-09-10 ENCOUNTER — Encounter: Payer: Self-pay | Admitting: Orthopedic Surgery

## 2020-09-10 DIAGNOSIS — E1140 Type 2 diabetes mellitus with diabetic neuropathy, unspecified: Secondary | ICD-10-CM | POA: Diagnosis not present

## 2020-09-10 DIAGNOSIS — I10 Essential (primary) hypertension: Secondary | ICD-10-CM | POA: Diagnosis not present

## 2020-09-10 DIAGNOSIS — Z4781 Encounter for orthopedic aftercare following surgical amputation: Secondary | ICD-10-CM | POA: Diagnosis not present

## 2020-09-10 DIAGNOSIS — Z89432 Acquired absence of left foot: Secondary | ICD-10-CM

## 2020-09-10 DIAGNOSIS — D649 Anemia, unspecified: Secondary | ICD-10-CM | POA: Diagnosis not present

## 2020-09-10 DIAGNOSIS — U071 COVID-19: Secondary | ICD-10-CM | POA: Diagnosis not present

## 2020-09-10 DIAGNOSIS — E11610 Type 2 diabetes mellitus with diabetic neuropathic arthropathy: Secondary | ICD-10-CM | POA: Diagnosis not present

## 2020-09-10 NOTE — Progress Notes (Signed)
Office Visit Note   [name]           Date of Birth: 03-12-1945           MRN: [id number] Visit Date: 09/10/2020              Requested by: Vivi Barrack, [address] [place],  [place] [address] PCP: Vivi Barrack, MD  Chief Complaint  Patient presents with   Left Foot - Routine Post Op    08/12/20 left trasnemt amputation       HPI: Patient is a 75 year old woman who presents status post left transmetatarsal amputation.  She is 4 weeks out from surgery.  Assessment & Plan: Visit Diagnoses:  1. 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HCC)     Plan: Sutures were harvested she will continue wearing her stump shrinker increase her activities as tolerated bring her shoe on orthotics at follow-up will evaluate for possible need for a carbon plate and possible spacer.  Follow-Up Instructions: Return in about 2 weeks (around 09/24/2020).   Ortho Exam  Patient is alert, oriented, no adenopathy, well-dressed, normal affect, normal respiratory effort. Examination patient does have swelling in the foot there is no redness no cellulitis the wound is well-healed.  Sutures are harvested today.  She has good dorsiflexion of the ankle.  Imaging: US Abdomen Limited RUQ (LIVER/GB)  Result Date: 09/09/2020 CLINICAL DATA:  Right upper quadrant pain, nausea EXAM: ULTRASOUND ABDOMEN LIMITED RIGHT UPPER QUADRANT COMPARISON:  CT abdomen/pelvis 10/22/2013 FINDINGS: Gallbladder: No gallstones or wall thickening visualized. No sonographic Murphy sign noted by sonographer. Common bile duct: Diameter: 5 mm Liver: Parenchymal echogenicity is diffusely increased with loss of through transmission consistent with fatty infiltration. There is no focal lesion. Portal vein is patent on color Doppler imaging with normal direction of blood flow towards the liver. Other: None. IMPRESSION: 1. No cholelithiasis or evidence of cholecystitis. 2. Moderate hepatic steatosis Electronically  Signed   By: Valetta Mole M.D.   On: 09/09/2020 10:39   No images are attached to the encounter.  Labs: Lab Results  Component Value Date   HGBA1C 8.1 (H) 08/12/2020   HGBA1C 8.4 (H) 05/29/2020   HGBA1C 8.0 (A) 03/18/2020   ESRSEDRATE 51 (H) 12/24/2019   ESRSEDRATE 36 (H) 02/24/2017   CRP 13.2 (H) 12/24/2019   CRP 1.2 (H) 02/24/2017   LABURIC 5.5 09/01/2015   REPTSTATUS 06/03/2020 FINAL 05/29/2020   CULT  05/29/2020    NO GROWTH 5 DAYS Performed at [facility], [place] [address] [place], [place] [address]    LABORGA GROUP B STREP (S.AGALACTIAE) ISOLATED 12/26/2014     Lab Results  Component Value Date   ALBUMIN 3.8 09/08/2020   ALBUMIN 3.4 (L) 05/29/2020   ALBUMIN 3.6 12/26/2019   PREALBUMIN 19.4 03/05/2012    No results found for: MG No results found for: VD25OH  Lab Results  Component Value Date   PREALBUMIN 19.4 03/05/2012   CBC EXTENDED Latest Ref Rng & Units 09/08/2020 08/12/2020 07/02/2020  WBC 4.0 - 10.5 K/uL 11.4(H) 9.8 11.7(H)  RBC 3.87 - 5.11 Mil/uL 3.91 4.02 4.18  HGB 12.0 - 15.0 g/dL 10.3(L) 10.8(L) 11.1(L)  HCT 36.0 - 46.0 % 31.3(L) 34.4(L) 35.5(L)  PLT 150.0 - 400.0 K/uL 444.0(H) 403(H) 383  NEUTROABS 1.4 - 7.7 K/uL 7.4 - 7.8(H)  LYMPHSABS 0.7 - 4.0 K/uL 3.0 - 2.6     There is no height or weight on file to calculate BMI.  Orders:  No orders of the defined types were placed in this encounter.  No orders of the defined types were placed in this encounter.    Procedures: No procedures performed  Clinical Data: No additional findings.  ROS:  All other systems negative, except as noted in the HPI. Review of Systems  Objective: Vital Signs: There were no vitals taken for this visit.  Specialty Comments:  No specialty comments available.  PMFS History: Patient Active Problem List   Diagnosis Date Noted   Abscess of left foot 08/12/2020   Memory loss 11/21/2019   Mild nonproliferative diabetic retinopathy of both eyes ([place])  10/23/2019   Nuclear sclerotic cataract of both eyes 10/23/2019   Posterior vitreous detachment of right eye 10/23/2019   Retinal hemorrhage of left eye 10/23/2019   Hyperlipidemia associated with type 2 diabetes mellitus ([place]) 11/30/2017   Impingement syndrome of right shoulder 07/06/2017   Iron deficiency anemia 05/11/2017   Morbid obesity ([place]) 04/24/2017   Subacute osteomyelitis, left ankle and foot ([place])    Anemia 02/24/2017   Baker's cyst 07/10/2016   Onychomycosis 12/07/2015   Upper airway cough syndrome 03/05/2014   History of amputation of lesser toe of right foot ([place]) 04/15/2013   Hypertension associated with diabetes ([place]) 12/05/2006   Uncontrolled type 2 diabetes mellitus with peripheral circulatory disorder ([place]) 10/12/2006   Dyslipidemia associated with type 2 diabetes mellitus ([place]) 10/12/2006   Allergic rhinitis 10/12/2006   GERD - Followed by Dr. Fuller Plan 10/12/2006   IRRITABLE BOWEL SYNDROME - followed by Dr. Fuller Plan 10/12/2006   Peripheral neuropathy 10/11/2006   ALOPECIA NEC 10/11/2006   Past Medical History:  Diagnosis Date   Alopecia    Anemia, mild    Arthritis    Chronic cough    sees pulmonologist   Chronic pain    chest wall and abd - s/p extensive eval   Diabetes mellitus with neuropathy (HCC)    sees endocrine   Diverticulosis    Dyspnea    Fatty liver    GERD (gastroesophageal reflux disease)    takes Nexium bid, hx erosive esophagitis   Headache(784.0)    occasionally;r/t sinus    History of colon polyps    HTN (hypertension)    Hx of amputation of lesser toe (HCC)    sees podiatrist   Hyperlipemia    IBS (irritable bowel syndrome)    Insomnia    takes Elavil nightly   Joint pain    Neuropathy    Neuropathy    Osteomyelitis ([place])    Pneumonia    89/2/22- patient denies   PONV (postoperative nausea and vomiting)    medication did not help with surgery on 03/28/20   Seasonal allergies    takes Zyrtec daily   Sinus tachycardia      Family History  Problem Relation Age of Onset   Lung cancer Mother 20       smoked heavily   Emphysema Mother    Hypertension Father    Hyperlipidemia Father    Diabetes Father    Coronary artery disease Father    Dementia Father    COPD Father        smoked   Stomach cancer Paternal Aunt    Brain cancer Paternal Uncle    Irritable bowel syndrome Other        Several family members on fathers side    Diabetes Other    Stomach cancer Maternal Aunt    Lung cancer Paternal Uncle  Heart disease Paternal Uncle    Colon cancer Neg Hx     Past Surgical History:  Procedure Laterality Date   AMPUTATION  12/28/2011   Procedure: AMPUTATION DIGIT;  Surgeon: Newt Minion, MD;  Location: [facility];  Service: Orthopedics;  Laterality: Right;  Right Foot 2nd Toe Amputation at MTP (metatarsophalangeal joint)   AMPUTATION Right 04/25/2012   Procedure: Right Foot 3rd Toe Amputation;  Surgeon: Newt Minion, MD;  Location: [facility];  Service: Orthopedics;  Laterality: Right;  Right Foot Third Toe Amputation    AMPUTATION Right 07/27/2012   Procedure: Right 4th Toe Amputation at Metatarsophalangeal;  Surgeon: Newt Minion, MD;  Location: [facility];  Service: Orthopedics;  Laterality: Right;  Right 4th Toe Amputation at Metatarsophalangeal   AMPUTATION Left 07/15/2016   Procedure: Left 2nd Ray Amputation;  Surgeon: Newt Minion, MD;  Location: [facility];  Service: Orthopedics;  Laterality: Left;   AMPUTATION Left 11/18/2016   Procedure: Left 3rd and 4th Ray Amputation;  Surgeon: Newt Minion, MD;  Location: [facility];  Service: Orthopedics;  Laterality: Left;   AMPUTATION Right 03/29/2017   Procedure: RIGHT FOOT 3RD AND 4TH RAY AMPUTATION;  Surgeon: Newt Minion, MD;  Location: [facility];  Service: Orthopedics;  Laterality: Right;   AMPUTATION Left 08/12/2020   Procedure: LEFT TRANSMETATARSAL AMPUTATION;  Surgeon: Newt Minion, MD;  Location: [facility];  Service: Orthopedics;  Laterality: Left;   APPLICATION OF WOUND VAC  Left 08/12/2020   Procedure: APPLICATION OF WOUND VAC;  Surgeon: Newt Minion, MD;  Location: [facility];  Service: Orthopedics;  Laterality: Left;   COLONOSCOPY     LAPAROSCOPIC APPENDECTOMY  01/05/2011   Procedure: APPENDECTOMY LAPAROSCOPIC;  Surgeon: Pedro Earls, MD;  Location: [facility];  Service: General;  Laterality: N/A;   OOPHORECTOMY  2001   ROTATOR CUFF REPAIR Right    x 2   TUBAL LIGATION     VAGINAL HYSTERECTOMY  2001   Social History   Occupational History   Occupation: Retired   Tobacco Use   Smoking status: Never   Smokeless tobacco: Never  Vaping Use   Vaping Use: Never used  Substance and Sexual Activity   Alcohol use: Never   Drug use: Never   Sexual activity: Yes    Birth control/protection: Surgical

## 2020-09-14 DIAGNOSIS — Z9181 History of falling: Secondary | ICD-10-CM | POA: Diagnosis not present

## 2020-09-14 DIAGNOSIS — E1140 Type 2 diabetes mellitus with diabetic neuropathy, unspecified: Secondary | ICD-10-CM | POA: Diagnosis not present

## 2020-09-14 DIAGNOSIS — Z8701 Personal history of pneumonia (recurrent): Secondary | ICD-10-CM | POA: Diagnosis not present

## 2020-09-14 DIAGNOSIS — Z89431 Acquired absence of right foot: Secondary | ICD-10-CM | POA: Diagnosis not present

## 2020-09-14 DIAGNOSIS — Z794 Long term (current) use of insulin: Secondary | ICD-10-CM | POA: Diagnosis not present

## 2020-09-14 DIAGNOSIS — Z7984 Long term (current) use of oral hypoglycemic drugs: Secondary | ICD-10-CM | POA: Diagnosis not present

## 2020-09-14 DIAGNOSIS — J302 Other seasonal allergic rhinitis: Secondary | ICD-10-CM | POA: Diagnosis not present

## 2020-09-14 DIAGNOSIS — E785 Hyperlipidemia, unspecified: Secondary | ICD-10-CM | POA: Diagnosis not present

## 2020-09-14 DIAGNOSIS — G4700 Insomnia, unspecified: Secondary | ICD-10-CM | POA: Diagnosis not present

## 2020-09-14 DIAGNOSIS — E11610 Type 2 diabetes mellitus with diabetic neuropathic arthropathy: Secondary | ICD-10-CM | POA: Diagnosis not present

## 2020-09-14 DIAGNOSIS — G8929 Other chronic pain: Secondary | ICD-10-CM | POA: Diagnosis not present

## 2020-09-14 DIAGNOSIS — K5790 Diverticulosis of intestine, part unspecified, without perforation or abscess without bleeding: Secondary | ICD-10-CM | POA: Diagnosis not present

## 2020-09-14 DIAGNOSIS — Z89421 Acquired absence of other right toe(s): Secondary | ICD-10-CM | POA: Diagnosis not present

## 2020-09-14 DIAGNOSIS — K760 Fatty (change of) liver, not elsewhere classified: Secondary | ICD-10-CM | POA: Diagnosis not present

## 2020-09-14 DIAGNOSIS — M170 Bilateral primary osteoarthritis of knee: Secondary | ICD-10-CM | POA: Diagnosis not present

## 2020-09-14 DIAGNOSIS — K219 Gastro-esophageal reflux disease without esophagitis: Secondary | ICD-10-CM | POA: Diagnosis not present

## 2020-09-14 DIAGNOSIS — Z4781 Encounter for orthopedic aftercare following surgical amputation: Secondary | ICD-10-CM | POA: Diagnosis not present

## 2020-09-14 DIAGNOSIS — Z89432 Acquired absence of left foot: Secondary | ICD-10-CM | POA: Diagnosis not present

## 2020-09-14 DIAGNOSIS — K581 Irritable bowel syndrome with constipation: Secondary | ICD-10-CM | POA: Diagnosis not present

## 2020-09-14 DIAGNOSIS — Z89422 Acquired absence of other left toe(s): Secondary | ICD-10-CM | POA: Diagnosis not present

## 2020-09-14 DIAGNOSIS — Z86010 Personal history of colonic polyps: Secondary | ICD-10-CM | POA: Diagnosis not present

## 2020-09-14 DIAGNOSIS — D649 Anemia, unspecified: Secondary | ICD-10-CM | POA: Diagnosis not present

## 2020-09-14 DIAGNOSIS — I10 Essential (primary) hypertension: Secondary | ICD-10-CM | POA: Diagnosis not present

## 2020-09-14 DIAGNOSIS — U071 COVID-19: Secondary | ICD-10-CM | POA: Diagnosis not present

## 2020-09-17 ENCOUNTER — Other Ambulatory Visit: Payer: Self-pay

## 2020-09-17 ENCOUNTER — Encounter: Payer: Self-pay | Admitting: Internal Medicine

## 2020-09-17 ENCOUNTER — Ambulatory Visit (INDEPENDENT_AMBULATORY_CARE_PROVIDER_SITE_OTHER): Payer: Medicare Other | Admitting: Internal Medicine

## 2020-09-17 VITALS — BP 128/76 | HR 79 | Ht 68.0 in | Wt 223.0 lb

## 2020-09-17 DIAGNOSIS — E1151 Type 2 diabetes mellitus with diabetic peripheral angiopathy without gangrene: Secondary | ICD-10-CM

## 2020-09-17 DIAGNOSIS — E11610 Type 2 diabetes mellitus with diabetic neuropathic arthropathy: Secondary | ICD-10-CM | POA: Diagnosis not present

## 2020-09-17 DIAGNOSIS — IMO0002 Reserved for concepts with insufficient information to code with codable children: Secondary | ICD-10-CM

## 2020-09-17 DIAGNOSIS — E1169 Type 2 diabetes mellitus with other specified complication: Secondary | ICD-10-CM | POA: Diagnosis not present

## 2020-09-17 DIAGNOSIS — D649 Anemia, unspecified: Secondary | ICD-10-CM | POA: Diagnosis not present

## 2020-09-17 DIAGNOSIS — G63 Polyneuropathy in diseases classified elsewhere: Secondary | ICD-10-CM | POA: Diagnosis not present

## 2020-09-17 DIAGNOSIS — E1165 Type 2 diabetes mellitus with hyperglycemia: Secondary | ICD-10-CM | POA: Diagnosis not present

## 2020-09-17 DIAGNOSIS — I10 Essential (primary) hypertension: Secondary | ICD-10-CM | POA: Diagnosis not present

## 2020-09-17 DIAGNOSIS — U071 COVID-19: Secondary | ICD-10-CM | POA: Diagnosis not present

## 2020-09-17 DIAGNOSIS — E785 Hyperlipidemia, unspecified: Secondary | ICD-10-CM

## 2020-09-17 DIAGNOSIS — E1140 Type 2 diabetes mellitus with diabetic neuropathy, unspecified: Secondary | ICD-10-CM | POA: Diagnosis not present

## 2020-09-17 DIAGNOSIS — Z4781 Encounter for orthopedic aftercare following surgical amputation: Secondary | ICD-10-CM | POA: Diagnosis not present

## 2020-09-17 MED ORDER — FREESTYLE LIBRE 2 READER DEVI: 1.0000 | Freq: Every day | 0 refills | Status: DC

## 2020-09-17 MED ORDER — TRESIBA FLEXTOUCH 200 UNIT/ML ~~LOC~~ SOPN: 34.0000 [IU] | PEN_INJECTOR | Freq: Every day | SUBCUTANEOUS | 3 refills | Status: DC

## 2020-09-17 MED ORDER — FREESTYLE LIBRE 2 SENSOR MISC: 1.0000 | 3 refills | Status: DC

## 2020-09-17 NOTE — Progress Notes (Signed)
Subjective:     Patient ID: Beth Holden, female   DOB: 04-Dec-1945, 75 y.o.   MRN: [id number]  This visit occurred during the SARS-CoV-2 public health emergency.  Safety protocols were in place, including screening questions prior to the visit, additional usage of staff PPE, and extensive cleaning of exam room while observing appropriate contact time as indicated for disinfecting solutions.   HPI Beth Holden is a pleasant 75 y.o. 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  Last visit 6 months ago.  Interim history: She has occasional blurry vision, nausea, chest pain. She also has increased urination. She had COVID-19 in 07/2020. Since last visit, she developed an ulcer with subacute osteomyelitis of the left foot so she had to have a left transmetatarsal amputation 08/12/2020.  Reviewed HbA1c levels: Lab Results  Component Value Date   HGBA1C 8.1 (H) 08/12/2020   HGBA1C 8.4 (H) 05/29/2020   HGBA1C 8.0 (A) 03/18/2020   She is on:: - Metformin 1000 mg 2x a day Insulin Before breakfast Before lunch Before dinner At bedtime  Regular 25-30 10-15 10-15 x  NPH 20 x x 10 She also tried: [place] 03/2020 -could not start b/c of  $$$ Levemir 47 units in am and 37 units in pm >> $800 for 3 mo supply Januvia 100 mg daily - $450 for 3 mo Invokana 100 mg - $455 for 3 mo Took Byetta before We discussed about starting her on a VGo mechanical pump in the past. She had an appointment with diabetes education but decided not to pursue it. We also tried a sliding scale of regular insulin in the past but she was not using it. We tried Trulicity but she could not tolerate it due to nausea, diarrhea, constipation, weakness.  She checks her sugars 2-4 times a day per review of her log: - am:  90s, 120-140s, 220, 242 >> 71, 110-198, 213 >> 113-227 >> 98,  129-230, 252 - 2h after b'fast : 155-255 >> n/c >> 199-325 >> 146-314 >> 190-295 - prelunch:   78, 188-274 >> 100-200s >> n/c > 214-314  >> 158-207 - 2-3h after lunch:  n/c >> 58,  144-275 >> 135-361 >> 130-204, 242 - before dinner:  140-200s >> 222 >> 126-215 >> 72, 128-195, 236 - after dinner:  82-176, 290 >> n/c >> 213-246 >> 154, 210-336 >> 111-222, 265 - bedtime: 180-200s, 300s >> 66, 105-256 >> 127-285 >> 119-222, 302 - nighttime: 40's, 110-300s >> 123-241 >> 82-142 >> 60, 79, 89-246 Lowest: 40s x3 (did not eat supper) >> 58 >> 82 >> 50s.  Has hypoglycemia awareness in the 70s. Highest: 342 in 05/2018 >> n/c >> 325 >>383 (Prednisone) >> 300s.  Meals: - Breakfast: egg, bacon, toast; grits; biscuit; fruit; sometimes skips >> 2 slices of toast + butter - Lunch: 1/2 PB sandwich or soup and yoghurt, sometimes crackers - Dinner: meat + vegetables + some starch - Snacks: fruit   + Mild CKD: BUN  Date Value Ref Range Status  09/08/2020 27 (H) 6 - 23 mg/dL Final   Creatinine, Ser  Date Value Ref Range Status  09/08/2020 0.98 0.40 - 1.20 mg/dL Final  On irbesartan.  -+ HL; latest lipid panel: Lab Results  Component Value Date   CHOL 92 12/19/2019   HDL 36.30 (L) 12/19/2019   LDLCALC 33 12/19/2019   TRIG 116.0 12/19/2019   CHOLHDL 3 12/19/2019  On rosuvastatin 20. She has fatty liver >> was advised to lose weight.  - last eye exam: 10/2019: Mild NPDR OU, without macular edema (Dr. Zadie Rhine).  -+ Numbness and tingling in feet-previously on amitriptyline, now on Neurontin  Derm: Dr. Jarome Matin. Need records Re: GFR.   Review of Systems  + see HPI Neurological: no tremors/+ numbness/+ tingling/no dizziness  I reviewed pt's medications, allergies, PMH, social hx, family hx, and changes were documented in the history of present illness. Otherwise, unchanged from my initial visit note.  Past Medical History:  Diagnosis Date   Alopecia    Anemia, mild    Arthritis    Chronic  cough    sees pulmonologist   Chronic pain    chest wall and abd - s/p extensive eval   Diabetes mellitus with neuropathy (HCC)    sees endocrine   Diverticulosis    Dyspnea    Fatty liver    GERD (gastroesophageal reflux disease)    takes Nexium bid, hx erosive esophagitis   Headache(784.0)    occasionally;r/t sinus    History of colon polyps    HTN (hypertension)    Hx of amputation of lesser toe (HCC)    sees podiatrist   Hyperlipemia    IBS (irritable bowel syndrome)    Insomnia    takes Elavil nightly   Joint pain    Neuropathy    Neuropathy    Osteomyelitis ([place])    Pneumonia    89/2/22- patient denies   PONV (postoperative nausea and vomiting)    medication did not help with surgery on 03/28/20   Seasonal allergies    takes Zyrtec daily   Sinus tachycardia    Past Surgical History:  Procedure Laterality Date   AMPUTATION  12/28/2011   Procedure: AMPUTATION DIGIT;  Surgeon: Newt Minion, MD;  Location: [facility];  Service: Orthopedics;  Laterality: Right;  Right Foot 2nd Toe Amputation at MTP (metatarsophalangeal joint)   AMPUTATION Right 04/25/2012   Procedure: Right Foot 3rd Toe Amputation;  Surgeon: Newt Minion, MD;  Location: [facility];  Service: Orthopedics;  Laterality: Right;  Right Foot Third Toe Amputation    AMPUTATION Right 07/27/2012   Procedure: Right 4th Toe Amputation at Metatarsophalangeal;  Surgeon: Newt Minion, MD;  Location: [facility];  Service: Orthopedics;  Laterality: Right;  Right 4th Toe Amputation at Metatarsophalangeal   AMPUTATION Left 07/15/2016   Procedure: Left 2nd Ray Amputation;  Surgeon: Newt Minion, MD;  Location: [facility];  Service: Orthopedics;  Laterality: Left;   AMPUTATION Left 11/18/2016   Procedure: Left 3rd and 4th Ray Amputation;  Surgeon: Newt Minion, MD;  Location: [facility];  Service: Orthopedics;  Laterality: Left;   AMPUTATION Right 03/29/2017   Procedure: RIGHT FOOT 3RD AND 4TH RAY AMPUTATION;  Surgeon: Newt Minion, MD;   Location: [facility];  Service: Orthopedics;  Laterality: Right;   AMPUTATION Left 08/12/2020   Procedure: LEFT TRANSMETATARSAL AMPUTATION;  Surgeon: Newt Minion, MD;  Location: [facility];  Service: Orthopedics;  Laterality: Left;   APPLICATION OF WOUND VAC Left 08/12/2020   Procedure: APPLICATION OF WOUND VAC;  Surgeon: Newt Minion, MD;  Location: [facility];  Service: Orthopedics;  Laterality: Left;   COLONOSCOPY     LAPAROSCOPIC APPENDECTOMY  01/05/2011   Procedure: APPENDECTOMY LAPAROSCOPIC;  Surgeon: Pedro Earls, MD;  Location: [facility];  Service: General;  Laterality: N/A;   OOPHORECTOMY  2001   ROTATOR CUFF REPAIR Right  x 2   TUBAL LIGATION     VAGINAL HYSTERECTOMY  2001   Social History   Socioeconomic History   Marital status: Married    Spouse name: Not on file   Number of children: 2   Years of education: Not on file   Highest education level: Not on file  Occupational History   Occupation: Retired   Tobacco Use   Smoking status: Never   Smokeless tobacco: Never  Vaping Use   Vaping Use: Never used  Substance and Sexual Activity   Alcohol use: Never   Drug use: Never   Sexual activity: Yes    Birth control/protection: Surgical  Other Topics Concern   Not on file  Social History Narrative   Caffeine daily    [employer], [employer] no diploma   Married '66   1 dtr- '78; 1 son '71; 2 grandchildren   Occupation: retired 04   Dad with alzheimers-had to place in [place] (summer '10)         Social Determinants of Radio broadcast assistant Strain: Not on Art therapist Insecurity: Not on file  Transportation Needs: Not on file  Physical Activity: Not on file  Stress: Not on file  Social Connections: Not on file  Intimate Partner Violence: Not on file   Current Outpatient Medications on File Prior to Visit  Medication Sig Dispense Refill   acetaminophen (TYLENOL) 500 MG tablet Take 500 mg by mouth every 8 (eight) hours as needed for mild pain.      azelastine (ASTELIN) 0.1 %  nasal spray Place 2 sprays into both nostrils 2 (two) times daily. (Patient taking differently: Place 2 sprays into both nostrils 2 (two) times daily as needed for rhinitis or allergies.) 30 mL 12   benzonatate (TESSALON) 100 MG capsule Take 1 capsule (100 mg total) by mouth 2 (two) times daily as needed for cough. 20 capsule 0   betamethasone dipropionate (DIPROLENE) 0.05 % cream Apply 1 application topically 2 (two) times daily as needed (IRRITATION). 30 g 0   Blood Glucose Monitoring Suppl (ONE TOUCH ULTRA 2) w/Device KIT Use to check blood sugar (Patient taking differently: 1 each by Other route daily.) 1 each 0   cetirizine (ZYRTEC) 10 MG tablet Take 1 tablet (10 mg total) by mouth daily. 90 tablet 0   cholecalciferol (VITAMIN D) 1000 units tablet Take 1,000 Units by mouth daily.     dicyclomine (BENTYL) 10 MG capsule Take 1 capsule (10 mg total) by mouth 3 (three) times daily as needed for spasms. 90 capsule 5   Fluocinolone Acetonide 0.01 % OIL Use 1-2 drops daily as needed. (Patient taking differently: Place 1-2 drops into both ears daily as needed (ear irritation).) 20 mL 0   gabapentin (NEURONTIN) 100 MG capsule TAKE 2 TO 3 CAPSULES AT BEDTIME (Patient taking differently: Take 200-300 mg by mouth at bedtime.) 180 capsule 3   glucagon (GLUCAGEN) 1 MG SOLR injection Inject 1 mg into the muscle once as needed for up to 1 dose for low blood sugar. 1 each 11   insulin NPH Human (NOVOLIN N RELION) 100 UNIT/ML injection Inject 0.15-0.2 mLs (15-20 Units total) into the skin See admin instructions. Inject under skin 12-15 units in am  and 10-15 units at bedtime per sliding scale (Patient taking differently: Inject 15-20 Units into the skin See admin instructions. Inject under skin 12-15 units in am  and 10-15 units at bedtime per sliding scale)     insulin regular (  NOVOLIN R RELION) 100 units/mL injection Inject 0.2-0.3 mLs (20-30 Units total) into the skin 3 (three) times daily before meals. Per sliding  scale     Insulin Syringe-Needle U-100 (RELION INSULIN SYRINGE 1ML/31G) 31G X 5/16" 1 ML MISC USE 2 TIMES A DAY (Patient taking differently: 1 each by Other route 2 (two) times daily.) 100 each 2   irbesartan-hydrochlorothiazide (AVALIDE) 150-12.5 MG tablet Take 1 tablet by mouth daily. 90 tablet 3   metFORMIN (GLUCOPHAGE) 1000 MG tablet Take 1 tablet (1,000 mg total) by mouth 2 (two) times daily with a meal. 180 tablet 3   metoprolol succinate (TOPROL-XL) 25 MG 24 hr tablet Take 1 tablet (25 mg total) by mouth daily. 90 tablet 3   nutrition supplement, JUVEN, (JUVEN) PACK Take 1 packet by mouth 2 (two) times daily between meals.  0   omeprazole (PRILOSEC) 40 MG capsule Take 1 capsule (40 mg total) by mouth 2 (two) times daily. 180 capsule 3   ondansetron (ZOFRAN ODT) 4 MG disintegrating tablet Take 1 tablet (4 mg total) by mouth every 4 (four) hours as needed for nausea or vomiting. 30 tablet 1   ONETOUCH ULTRA test strip USE  STRIP TO CHECK GLUCOSE THREE TIMES DAILY 300 each 0   OVER THE COUNTER MEDICATION Apply 1-3 application topically at bedtime as needed (foot pain). TOPRICIN FOOT CREAM - NEUROPATHY FOOT CREAM **APPLIES TO BOTH FEET AT BEDTIME**     oxyCODONE (OXY IR/ROXICODONE) 5 MG immediate release tablet Take 1-2 tablets (5-10 mg total) by mouth every 4 (four) hours as needed for moderate pain (pain score 4-6). 30 tablet 0   Probiotic Product (PROBIOTIC DAILY PO) Take 1 capsule by mouth daily.     rosuvastatin (CRESTOR) 20 MG tablet Take 1 tablet (20 mg total) by mouth daily. 90 tablet 3   vitamin B-12 (CYANOCOBALAMIN) 1000 MCG tablet Take 1,000 mcg daily by mouth.     vitamin C (ASCORBIC ACID) 500 MG tablet Take 500 mg by mouth daily.     zinc sulfate 220 (50 Zn) MG capsule Take 1 capsule (220 mg total) by mouth daily.     No current facility-administered medications on file prior to visit.   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    Codeine Other (See Comments)    Makes her crazy   Propoxyphene Hcl Itching    *DARVOCET    Family History  Problem Relation Age of Onset   Lung cancer Mother 70       smoked heavily   Emphysema Mother    Hypertension Father    Hyperlipidemia Father    Diabetes Father    Coronary artery disease Father    Dementia Father    COPD Father        smoked   Stomach cancer Paternal Aunt    Brain cancer Paternal Uncle    Irritable bowel syndrome Other        Several family members on fathers side    Diabetes Other    Stomach cancer Maternal Aunt    Lung cancer Paternal Uncle    Heart disease Paternal Uncle    Colon cancer Neg Hx     Objective:   Physical Exam BP 128/76 (BP Location: Right Arm, Patient Position: Sitting, Cuff Size: Normal)   Pulse 79   Ht _0  (1.727 m)   Wt 223 lb (101.2 kg)   SpO2 95%   BMI 33.91  kg/m  Body mass index is 33.91 kg/m. Wt Readings from Last 3 Encounters:  09/17/20 223 lb (101.2 kg)  09/08/20 224 lb 2 oz (101.7 kg)  08/11/20 225 lb (102.1 kg)   Constitutional: overweight, in NAD Eyes: PERRLA, EOMI, no exophthalmos ENT: moist mucous membranes, no thyromegaly, no cervical lymphadenopathy Cardiovascular: RRR, No MRG Respiratory: CTA B Gastrointestinal: abdomen soft, NT, ND, BS+ Musculoskeletal: + deformities (several amputations of toes, Charcot deformity of the left foot), left foot in boot, strength intact in all 4 Skin: moist, warm, + red-violaceous, healing macular papular rash on LEs Neurological: no tremor with outstretched hands, DTR normal in all 4  Assessment:     1. 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 HL  Plan:     1. DM2 - pt with longstanding, uncontrolled, type 2 diabetes, basal-bolus insulin regimen with NPH and regular insulin due to price.  She is also on metformin.  Sugars are usually very fluctuating, consistent with insulin deficiency.  We did not check her for type 1 diabetes as this would not change the management.  At last visit we tried to add an SGLT2 inhibitor Wilder Glade) but this was not affordable.  In the past, she also tried Invokana which was again very expensive.  We also tried Trulicity in the past which she could not tolerate due to GI symptoms.  She has IBS and has alternating diarrhea with constipation.  At last visit, HbA1c was better, at 8.0%.  Sugars were slightly higher at that time after being on prednisone but they were starting to improve.  We did not change her insulin regimen at that time. -She had another HbA1c obtained 4 months ago and this was higher, at 8.4%, however, a new HbA1c obtained a month ago decreased to 8.1%. -At today's visit, her sugars continue to be quite variable.  She had some lower blood sugars at night, despite having higher blood sugars after dinner and at bedtime so unfortunately, we are limited of what we can do with her NPH and regular insulin regimen due to the pharmacodynamics of these 2 insulins.  Therefore, I suggested to change NPH to an insulin analog.  We gave her sample of Basaglar and I advised her to take 16 units twice a day.  However, my goal would be for her to start [place], which is a longer acting insulin, with a flat profile.  Therefore, her risk of hypoglycemia will be decreased while on this insulin.  I sent the prescription for this to her mail order pharmacy. -In the meantime, I again suggested that she gets a CGM.  She saw our diabetes educator and talk to her about this but she was not convinced that she would actually keep it on her skin or needed.  Again, at this visit, I discussed with  her about benefits and demonstrated sensor use.  She agrees to try this again.  I advised her that she can put it on the inside of her arm so that she does not knock it off.  I sent the prescription for this to [facility].  I advised her to expect a call from them. -  I advised her to: Patient Instructions  Please continue: - Metformin 1000 mg 2x a day Insulin Before breakfast Before lunch Before dinner At bedtime  Regular 25-30 10-15 10-15    Try to change from NPH to:  - Tresiba 34 units daily/Basaglar 16 units twice a day  Please return in 3-4 months with your sugar log  - advised to check sugars at different times of the day - 4x a day, rotating check times - advised for yearly eye exams >> she is UTD - return to clinic in 4 months  2. PN -Related to diabetes -Continues to have numbness and tingling-mostly in her left big toe -Previously on amitriptyline, now on Neurontin.  She takes 2 capsules at night.  I am usually refilling this for her.  3. HL -Reviewed latest lipid panel from 12/2019: LDL and triglycerides at goal, HDL slightly low: Lab Results  Component Value Date   CHOL 92 12/19/2019   HDL 36.30 (L) 12/19/2019   LDLCALC 33 12/19/2019   TRIG 116.0 12/19/2019   CHOLHDL 3 12/19/2019  -Continues Crestor 20 mg daily without side effects  Philemon Kingdom, MD PhD [facility] Endocrinology

## 2020-09-17 NOTE — Patient Instructions (Addendum)
Please continue: - Metformin 1000 mg 2x a day Insulin Before breakfast Before lunch Before dinner At bedtime  Regular 25-30 10-15 10-15    Try to change from NPH to:  - Tresiba 34 units daily/Basaglar 16 units twice a day  Please return in 3-4 months with your sugar log

## 2020-09-18 ENCOUNTER — Telehealth: Payer: Self-pay | Admitting: Internal Medicine

## 2020-09-18 NOTE — Telephone Encounter (Signed)
Patient requests to be called at ph# [phone] re: Patient states she was given a sample of Basaglar for temporary use at her appointment yesterday (09/17/20), however, the sample will only last 3 days. Patient is going out of town later today (09/18/20 through 09/23/20, therefore, Patient requests another sample of Basaglar that she can pick up today (09/18/20 before she leaves town) so that Patient is not without medication. Patient requests to be called at the ph# listed above.

## 2020-09-18 NOTE — Telephone Encounter (Signed)
Pt's husband came by the office on 09/18/2020 at 2:15pm to pick this up

## 2020-09-18 NOTE — Telephone Encounter (Signed)
T, a Basaglar pen holds 300 units.  She is taking 32 units a day (16 units twice a day) so this pen should last her until she comes back...  We can provide another pen at that time, if needed.  Please make sure that she is calculating the dose correctly.

## 2020-09-18 NOTE — Telephone Encounter (Signed)
Called and advised pt of correct dosage. Pt advised she will run out of pen needles and requested some to hold her over. Pen needles labeled and available for pick up.

## 2020-09-18 NOTE — Telephone Encounter (Signed)
Ok to provide

## 2020-09-19 ENCOUNTER — Other Ambulatory Visit: Payer: Self-pay | Admitting: Internal Medicine

## 2020-09-19 DIAGNOSIS — IMO0002 Reserved for concepts with insufficient information to code with codable children: Secondary | ICD-10-CM

## 2020-09-19 DIAGNOSIS — E1151 Type 2 diabetes mellitus with diabetic peripheral angiopathy without gangrene: Secondary | ICD-10-CM

## 2020-09-24 ENCOUNTER — Other Ambulatory Visit: Payer: Self-pay

## 2020-09-24 ENCOUNTER — Ambulatory Visit (INDEPENDENT_AMBULATORY_CARE_PROVIDER_SITE_OTHER): Payer: Medicare Other | Admitting: Orthopedic Surgery

## 2020-09-24 DIAGNOSIS — Z89432 Acquired absence of left foot: Secondary | ICD-10-CM

## 2020-09-29 ENCOUNTER — Encounter: Payer: Self-pay | Admitting: Orthopedic Surgery

## 2020-09-29 ENCOUNTER — Encounter: Payer: Self-pay | Admitting: Internal Medicine

## 2020-09-29 NOTE — Progress Notes (Signed)
Office Visit Note   [name]           Date of Birth: 01-04-1946           MRN: [id number] Visit Date: 09/24/2020              Requested by: Vivi Barrack, [address],  [place] [id number] PCP: Vivi Barrack, MD  Chief Complaint  Patient presents with   Left Foot - Routine Post Op      HPI: Patient is a 75 year old woman who presents 6 weeks status post left transmetatarsal amputation.  Assessment & Plan: Visit Diagnoses:  1. 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HCC)     Plan: Increase her activities as tolerated continue with her custom shoes protein supplement and as the swelling resolves we will get her set up for a spacer and new orthotics.  Follow-Up Instructions: Return in about 4 weeks (around 10/22/2020).   Ortho Exam  Patient is alert, oriented, no adenopathy, well-dressed, normal affect, normal respiratory effort. Examination patient does have swelling of the left foot there is no cellulitis no odor no drainage no signs of infection the incision is well-healed.  Imaging: No results found. No images are attached to the encounter.  Labs: Lab Results  Component Value Date   HGBA1C 8.1 (H) 08/12/2020   HGBA1C 8.4 (H) 05/29/2020   HGBA1C 8.0 (A) 03/18/2020   ESRSEDRATE 51 (H) 12/24/2019   ESRSEDRATE 36 (H) 02/24/2017   CRP 13.2 (H) 12/24/2019   CRP 1.2 (H) 02/24/2017   LABURIC 5.5 09/01/2015   REPTSTATUS 06/03/2020 FINAL 05/29/2020   CULT  05/29/2020    NO GROWTH 5 DAYS Performed at [facility], [place] [address] [place],  [address]    LABORGA GROUP B STREP (S.AGALACTIAE) ISOLATED 12/26/2014     Lab Results  Component Value Date   ALBUMIN 3.8 09/08/2020   ALBUMIN 3.4 (L) 05/29/2020   ALBUMIN 3.6 12/26/2019   PREALBUMIN 19.4 03/05/2012    No results found for: MG No results found for: VD25OH  Lab Results  Component Value Date   PREALBUMIN 19.4 03/05/2012   CBC EXTENDED Latest Ref Rng  & Units 09/08/2020 08/12/2020 07/02/2020  WBC 4.0 - 10.5 K/uL 11.4(H) 9.8 11.7(H)  RBC 3.87 - 5.11 Mil/uL 3.91 4.02 4.18  HGB 12.0 - 15.0 g/dL 10.3(L) 10.8(L) 11.1(L)  HCT 36.0 - 46.0 % 31.3(L) 34.4(L) 35.5(L)  PLT 150.0 - 400.0 K/uL 444.0(H) 403(H) 383  NEUTROABS 1.4 - 7.7 K/uL 7.4 - 7.8(H)  LYMPHSABS 0.7 - 4.0 K/uL 3.0 - 2.6     There is no height or weight on file to calculate BMI.  Orders:  No orders of the defined types were placed in this encounter.  No orders of the defined types were placed in this encounter.    Procedures: No procedures performed  Clinical Data: No additional findings.  ROS:  All other systems negative, except as noted in the HPI. Review of Systems  Objective: Vital Signs: There were no vitals taken for this visit.  Specialty Comments:  No specialty comments available.  PMFS History: Patient Active Problem List   Diagnosis Date Noted   Abscess of left foot 08/12/2020   Memory loss 11/21/2019   Mild nonproliferative diabetic retinopathy of both eyes ([place]) 10/23/2019   Nuclear sclerotic cataract of both eyes 10/23/2019   Posterior vitreous detachment of right eye 10/23/2019   Retinal hemorrhage of left eye 10/23/2019   Hyperlipidemia  associated with type 2 diabetes mellitus ([place]) 11/30/2017   Impingement syndrome of right shoulder 07/06/2017   Iron deficiency anemia 05/11/2017   Morbid obesity ([place]) 04/24/2017   Subacute osteomyelitis, left ankle and foot ([place])    Anemia 02/24/2017   Baker's cyst 07/10/2016   Onychomycosis 12/07/2015   Upper airway cough syndrome 03/05/2014   History of amputation of lesser toe of right foot ([place]) 04/15/2013   Hypertension associated with diabetes ([place]) 12/05/2006   Uncontrolled type 2 diabetes mellitus with peripheral circulatory disorder ([place]) 10/12/2006   Dyslipidemia associated with type 2 diabetes mellitus ([place]) 10/12/2006   Allergic rhinitis 10/12/2006   GERD - Followed by Dr. Fuller Plan 10/12/2006    IRRITABLE BOWEL SYNDROME - followed by Dr. Fuller Plan 10/12/2006   Peripheral neuropathy 10/11/2006   ALOPECIA NEC 10/11/2006   Past Medical History:  Diagnosis Date   Alopecia    Anemia, mild    Arthritis    Chronic cough    sees pulmonologist   Chronic pain    chest wall and abd - s/p extensive eval   Diabetes mellitus with neuropathy (HCC)    sees endocrine   Diverticulosis    Dyspnea    Fatty liver    GERD (gastroesophageal reflux disease)    takes Nexium bid, hx erosive esophagitis   Headache(784.0)    occasionally;r/t sinus    History of colon polyps    HTN (hypertension)    Hx of amputation of lesser toe (HCC)    sees podiatrist   Hyperlipemia    IBS (irritable bowel syndrome)    Insomnia    takes Elavil nightly   Joint pain    Neuropathy    Neuropathy    Osteomyelitis ([place])    Pneumonia    89/2/22- patient denies   PONV (postoperative nausea and vomiting)    medication did not help with surgery on 03/28/20   Seasonal allergies    takes Zyrtec daily   Sinus tachycardia     Family History  Problem Relation Age of Onset   Lung cancer Mother 75       smoked heavily   Emphysema Mother    Hypertension Father    Hyperlipidemia Father    Diabetes Father    Coronary artery disease Father    Dementia Father    COPD Father        smoked   Stomach cancer Paternal Aunt    Brain cancer Paternal Uncle    Irritable bowel syndrome Other        Several family members on fathers side    Diabetes Other    Stomach cancer Maternal Aunt    Lung cancer Paternal Uncle    Heart disease Paternal Uncle    Colon cancer Neg Hx     Past Surgical History:  Procedure Laterality Date   AMPUTATION  12/28/2011   Procedure: AMPUTATION DIGIT;  Surgeon: Newt Minion, MD;  Location: [facility];  Service: Orthopedics;  Laterality: Right;  Right Foot 2nd Toe Amputation at MTP (metatarsophalangeal joint)   AMPUTATION Right 04/25/2012   Procedure: Right Foot 3rd Toe Amputation;  Surgeon: Newt Minion, MD;  Location: [facility];  Service: Orthopedics;  Laterality: Right;  Right Foot Third Toe Amputation    AMPUTATION Right 07/27/2012   Procedure: Right 4th Toe Amputation at Metatarsophalangeal;  Surgeon: Newt Minion, MD;  Location: [facility];  Service: Orthopedics;  Laterality: Right;  Right 4th Toe Amputation at Metatarsophalangeal   AMPUTATION Left 07/15/2016  Procedure: Left 2nd Ray Amputation;  Surgeon: Newt Minion, MD;  Location: [facility];  Service: Orthopedics;  Laterality: Left;   AMPUTATION Left 11/18/2016   Procedure: Left 3rd and 4th Ray Amputation;  Surgeon: Newt Minion, MD;  Location: [facility];  Service: Orthopedics;  Laterality: Left;   AMPUTATION Right 03/29/2017   Procedure: RIGHT FOOT 3RD AND 4TH RAY AMPUTATION;  Surgeon: Newt Minion, MD;  Location: [facility];  Service: Orthopedics;  Laterality: Right;   AMPUTATION Left 08/12/2020   Procedure: LEFT TRANSMETATARSAL AMPUTATION;  Surgeon: Newt Minion, MD;  Location: [place] [facility];  Service: Orthopedics;  Laterality: Left;   APPLICATION OF WOUND VAC Left 08/12/2020   Procedure: APPLICATION OF WOUND VAC;  Surgeon: Newt Minion, MD;  Location: [facility];  Service: Orthopedics;  Laterality: Left;   COLONOSCOPY     LAPAROSCOPIC APPENDECTOMY  01/05/2011   Procedure: APPENDECTOMY LAPAROSCOPIC;  Surgeon: Pedro Earls, MD;  Location: [facility];  Service: General;  Laterality: N/A;   OOPHORECTOMY  2001   ROTATOR CUFF REPAIR Right    x 2   TUBAL LIGATION     VAGINAL HYSTERECTOMY  2001   Social History   Occupational History   Occupation: Retired   Tobacco Use   Smoking status: Never   Smokeless tobacco: Never  Vaping Use   Vaping Use: Never used  Substance and Sexual Activity   Alcohol use: Never   Drug use: Never   Sexual activity: Yes    Birth control/protection: Surgical

## 2020-09-30 ENCOUNTER — Encounter: Payer: Self-pay | Admitting: Internal Medicine

## 2020-09-30 ENCOUNTER — Other Ambulatory Visit: Payer: Self-pay

## 2020-09-30 ENCOUNTER — Ambulatory Visit (INDEPENDENT_AMBULATORY_CARE_PROVIDER_SITE_OTHER): Payer: Medicare Other | Admitting: Internal Medicine

## 2020-09-30 VITALS — BP 120/66 | HR 69 | Ht 66.0 in | Wt 223.4 lb

## 2020-09-30 DIAGNOSIS — I10 Essential (primary) hypertension: Secondary | ICD-10-CM | POA: Diagnosis not present

## 2020-09-30 NOTE — Progress Notes (Signed)
Cardiology Office Note   Date:  09/30/2020   ID:  Beth Holden, DOB December 23, 1945, MRN [id number]  PCP:  Vivi Barrack, MD  Cardiologist:   Dorris Carnes, MD   Pt returns for f/u of HTN and HL   History of Present Illness: Beth Holden is a 75 y.o. 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      azelastine (ASTELIN) 0.1 % nasal spray Place 2 sprays into both nostrils 2 (two) times daily. (Patient taking differently: Place 2 sprays into both nostrils 2 (two) times daily as needed for rhinitis or allergies.) 30 mL 12   betamethasone dipropionate (DIPROLENE) 0.05 % cream Apply 1 application topically 2 (two) times daily as needed (IRRITATION). 30 g 0   Blood Glucose Monitoring Suppl (ONE TOUCH ULTRA 2) w/Device KIT Use to check blood sugar (Patient taking differently: 1 each by Other route daily.) 1 each 0   cetirizine (ZYRTEC) 10 MG tablet Take 1 tablet (10 mg total) by mouth daily. 90 tablet 0   cholecalciferol (VITAMIN D) 1000 units tablet Take 1,000 Units by mouth daily.     Continuous Blood Gluc Receiver (FREESTYLE LIBRE  2 READER) DEVI 1 each by Does not apply route daily. 1 each 0   Continuous Blood Gluc Sensor (FREESTYLE LIBRE 2 SENSOR) MISC 1 each by Does not apply route every 14 (fourteen) days. 6 each 3   dicyclomine (BENTYL) 10 MG capsule Take 1 capsule (10 mg total) by mouth 3 (three) times daily as needed for spasms. 90 capsule 5   Fluocinolone Acetonide 0.01 % OIL Use 1-2 drops daily as needed. (Patient taking differently: Place 1-2 drops into both ears daily as needed (ear irritation).) 20 mL 0   gabapentin (NEURONTIN) 100 MG capsule TAKE 2 TO 3 CAPSULES AT BEDTIME (Patient taking differently: Take 200-300 mg by mouth at bedtime.) 180 capsule 3   glucagon (GLUCAGEN) 1 MG SOLR injection Inject 1 mg into the muscle once as needed for up to 1 dose for low blood sugar. 1 each 11   insulin NPH Human (NOVOLIN N RELION) 100 UNIT/ML injection Inject 0.15-0.2 mLs (15-20 Units total) into the skin See admin instructions. Inject under skin 12-15 units in am  and 10-15 units at bedtime per sliding scale (Patient taking differently: Inject 15-20 Units into the skin See admin instructions. Inject under skin 12-15 units  in am  and 10-15 units at bedtime per sliding scale)     insulin regular (NOVOLIN R RELION) 100 units/mL injection Inject 0.2-0.3 mLs (20-30 Units total) into the skin 3 (three) times daily before meals. Per sliding scale     Insulin Syringe-Needle U-100 (RELION INSULIN SYRINGE 1ML/31G) 31G X 5/16" 1 ML MISC USE 2 TIMES A DAY (Patient taking differently: 1 each by Other route 2 (two) times daily.) 100 each 2   irbesartan-hydrochlorothiazide (AVALIDE) 150-12.5 MG tablet Take 1 tablet by mouth daily. 90 tablet 3   metFORMIN (GLUCOPHAGE) 1000 MG tablet Take 1 tablet (1,000 mg total) by mouth 2 (two) times daily with a meal. 180 tablet 3   metoprolol succinate (TOPROL-XL) 25 MG 24 hr tablet Take 1 tablet (25 mg total) by mouth daily. 90 tablet 3   nutrition supplement, JUVEN, (JUVEN) PACK Take 1 packet by mouth 2  (two) times daily between meals.  0   omeprazole (PRILOSEC) 40 MG capsule Take 1 capsule (40 mg total) by mouth 2 (two) times daily. 180 capsule 3   ondansetron (ZOFRAN ODT) 4 MG disintegrating tablet Take 1 tablet (4 mg total) by mouth every 4 (four) hours as needed for nausea or vomiting. 30 tablet 1   ONETOUCH ULTRA test strip USE  STRIP TO CHECK GLUCOSE THREE TIMES DAILY 300 each 3   OVER THE COUNTER MEDICATION Apply 1-3 application topically at bedtime as needed (foot pain). TOPRICIN FOOT CREAM - NEUROPATHY FOOT CREAM **APPLIES TO BOTH FEET AT BEDTIME**     Probiotic Product (PROBIOTIC DAILY PO) Take 1 capsule by mouth daily.     rosuvastatin (CRESTOR) 20 MG tablet Take 1 tablet (20 mg total) by mouth daily. 90 tablet 3   vitamin B-12 (CYANOCOBALAMIN) 1000 MCG tablet Take 1,000 mcg daily by mouth.     vitamin C (ASCORBIC ACID) 500 MG tablet Take 500 mg by mouth daily.     zinc sulfate 220 (50 Zn) MG capsule Take 1 capsule (220 mg total) by mouth daily.     benzonatate (TESSALON) 100 MG capsule Take 1 capsule (100 mg total) by mouth 2 (two) times daily as needed for cough. (Patient not taking: Reported on 09/30/2020) 20 capsule 0   insulin degludec (TRESIBA FLEXTOUCH) 200 UNIT/ML FlexTouch Pen Inject 34 Units into the skin daily. (Patient not taking: Reported on 09/30/2020) 12 mL 3   oxyCODONE (OXY IR/ROXICODONE) 5 MG immediate release tablet Take 1-2 tablets (5-10 mg total) by mouth every 4 (four) hours as needed for moderate pain (pain score 4-6). (Patient not taking: Reported on 09/30/2020) 30 tablet 0   No facility-administered medications prior to visit.     Allergies:   Doxycycline calcium, Codeine, and Propoxyphene hcl   Past Medical History:  Diagnosis Date   Alopecia    Anemia, mild    Arthritis    Chronic cough    sees pulmonologist   Chronic pain    chest wall and abd - s/p extensive eval   Diabetes mellitus with neuropathy (HCC)    sees endocrine   Diverticulosis     Dyspnea    Fatty liver    GERD (gastroesophageal reflux disease)    takes Nexium bid, hx erosive esophagitis   Headache(784.0)    occasionally;r/t sinus    History of colon polyps    HTN (hypertension)    Hx of amputation of lesser toe (HCC)    sees podiatrist   Hyperlipemia    IBS (irritable bowel syndrome)  Insomnia    takes Elavil nightly   Joint pain    Neuropathy    Neuropathy    Osteomyelitis ([place])    Pneumonia    89/2/22- patient denies   PONV (postoperative nausea and vomiting)    medication did not help with surgery on 03/28/20   Seasonal allergies    takes Zyrtec daily   Sinus tachycardia     Past Surgical History:  Procedure Laterality Date   AMPUTATION  12/28/2011   Procedure: AMPUTATION DIGIT;  Surgeon: Newt Minion, MD;  Location: [facility];  Service: Orthopedics;  Laterality: Right;  Right Foot 2nd Toe Amputation at MTP (metatarsophalangeal joint)   AMPUTATION Right 04/25/2012   Procedure: Right Foot 3rd Toe Amputation;  Surgeon: Newt Minion, MD;  Location: [facility];  Service: Orthopedics;  Laterality: Right;  Right Foot Third Toe Amputation    AMPUTATION Right 07/27/2012   Procedure: Right 4th Toe Amputation at Metatarsophalangeal;  Surgeon: Newt Minion, MD;  Location: [facility];  Service: Orthopedics;  Laterality: Right;  Right 4th Toe Amputation at Metatarsophalangeal   AMPUTATION Left 07/15/2016   Procedure: Left 2nd Ray Amputation;  Surgeon: Newt Minion, MD;  Location: [facility];  Service: Orthopedics;  Laterality: Left;   AMPUTATION Left 11/18/2016   Procedure: Left 3rd and 4th Ray Amputation;  Surgeon: Newt Minion, MD;  Location: [facility];  Service: Orthopedics;  Laterality: Left;   AMPUTATION Right 03/29/2017   Procedure: RIGHT FOOT 3RD AND 4TH RAY AMPUTATION;  Surgeon: Newt Minion, MD;  Location: [facility];  Service: Orthopedics;  Laterality: Right;   AMPUTATION Left 08/12/2020   Procedure: LEFT TRANSMETATARSAL AMPUTATION;  Surgeon: Newt Minion, MD;  Location: [facility];  Service: Orthopedics;  Laterality: Left;   APPLICATION OF WOUND VAC Left 08/12/2020   Procedure: APPLICATION OF WOUND VAC;  Surgeon: Newt Minion, MD;  Location: [facility];  Service: Orthopedics;  Laterality: Left;   COLONOSCOPY     LAPAROSCOPIC APPENDECTOMY  01/05/2011   Procedure: APPENDECTOMY LAPAROSCOPIC;  Surgeon: Pedro Earls, MD;  [facility];  Service: General;  Laterality: N/A;   OOPHORECTOMY  2001   ROTATOR CUFF REPAIR Right    x 2   TUBAL LIGATION     VAGINAL HYSTERECTOMY  2001     Social History:  The patient  reports that she has never smoked. She has never used smokeless tobacco. She reports that she does not drink alcohol and does not use drugs.   Family History:  The patient's family history includes Brain cancer in her paternal uncle; COPD in her father; Coronary artery disease in her father; Dementia in her father; Diabetes in her father and another family member; Emphysema in her mother; Heart disease in her paternal uncle; Hyperlipidemia in her father; Hypertension in her father; Irritable bowel syndrome in an other family member; Lung cancer in her paternal uncle; Lung cancer (age of onset: 69) in her mother; Stomach cancer in her maternal aunt and paternal aunt.    ROS:  Please see the history of present illness. All other systems are reviewed and  Negative to the above problem except as noted.    PHYSICAL EXAM: VS:  BP 120/66   Pulse 69   Ht _0  (1.676 m)   Wt 223 lb 6.4 oz (101.3 kg)   SpO2 96%   BMI 36.06 kg/m   GEN:  Morbidly obese 75 yo  in no acute distress  HEENT: normal  Neck:  no JVD, no carotid bruits,  Cardiac: RRR; no murmurs  No LE edema  Respiratory:  clear to auscultation bilaterally, GI: soft, nontender, nondistended, + BS  No hepatomegaly  MS: s/p amputation in toes bilatera    Skin: warm and dry  Healed lesions on legs  Neuro: CN II to XII intact     EKG:  EKG is not  ordered today.    Lipid Panel    Component Value  Date/Time   CHOL 92 12/19/2019 1036   TRIG 116.0 12/19/2019 1036   HDL 36.30 (L) 12/19/2019 1036   CHOLHDL 3 12/19/2019 1036   VLDL 23.2 12/19/2019 1036   LDLCALC 33 12/19/2019 1036      Wt Readings from Last 3 Encounters:  09/30/20 223 lb 6.4 oz (101.3 kg)  09/17/20 223 lb (101.2 kg)  09/08/20 224 lb 2 oz (101.7 kg)      ASSESSMENT AND PLAN:   1  CP  Pt without CP     2HTN  BP is controlled Continue current meds    3  HL Will get lipids     4  SOB  Denies   Cough may be allergies  Suggested steroid inhaler or nasal spray  during the fall    Current medicines are reviewed at length with the patient today.  The patient does not have concerns regarding medicines.  Signed, Dorris Carnes, MD  09/30/2020 10:19 AM    [facility] [place], [place], [place]  [address] Phone: [phone]; Fax: [phone]

## 2020-09-30 NOTE — Patient Instructions (Signed)
Medication Instructions:  No changes *If you need a refill on your cardiac medications before your next appointment, please call your pharmacy*   Lab Work: none If you have labs (blood work) drawn today and your tests are completely normal, you will receive your results only by: . MyChart Message (if you have MyChart) OR . A paper copy in the mail If you have any lab test that is abnormal or we need to change your treatment, we will call you to review the results.   Testing/Procedures: none   Follow-Up: At [facility], you and your health needs are our priority.  As part of our continuing mission to provide you with exceptional heart care, we have created designated Provider Care Teams.  These Care Teams include your primary Cardiologist (physician) and Advanced Practice Providers (APPs -  Physician Assistants and Nurse Practitioners) who all work together to provide you with the care you need, when you need it.   Your next appointment:   8 month(s)  The format for your next appointment:   In Person  Provider:   You may see Paula Ross, MD or one of the following Advanced Practice Providers on your designated Care Team:    Scott Weaver, PA-C  Vin Bhagat, PA-C   Other Instructions   

## 2020-10-01 DIAGNOSIS — U071 COVID-19: Secondary | ICD-10-CM | POA: Diagnosis not present

## 2020-10-01 DIAGNOSIS — D649 Anemia, unspecified: Secondary | ICD-10-CM | POA: Diagnosis not present

## 2020-10-01 DIAGNOSIS — I10 Essential (primary) hypertension: Secondary | ICD-10-CM | POA: Diagnosis not present

## 2020-10-01 DIAGNOSIS — Z4781 Encounter for orthopedic aftercare following surgical amputation: Secondary | ICD-10-CM | POA: Diagnosis not present

## 2020-10-01 DIAGNOSIS — E1140 Type 2 diabetes mellitus with diabetic neuropathy, unspecified: Secondary | ICD-10-CM | POA: Diagnosis not present

## 2020-10-01 DIAGNOSIS — E11610 Type 2 diabetes mellitus with diabetic neuropathic arthropathy: Secondary | ICD-10-CM | POA: Diagnosis not present

## 2020-10-22 ENCOUNTER — Ambulatory Visit (INDEPENDENT_AMBULATORY_CARE_PROVIDER_SITE_OTHER): Payer: Medicare Other | Admitting: Orthopedic Surgery

## 2020-10-22 ENCOUNTER — Other Ambulatory Visit: Payer: Self-pay

## 2020-10-22 ENCOUNTER — Encounter: Payer: Self-pay | Admitting: Orthopedic Surgery

## 2020-10-22 ENCOUNTER — Encounter: Payer: Self-pay | Admitting: Family Medicine

## 2020-10-22 DIAGNOSIS — Z89432 Acquired absence of left foot: Secondary | ICD-10-CM

## 2020-10-22 NOTE — Progress Notes (Signed)
Office Visit Note   [name]           Date of Birth: 04/20/1945           MRN: [id number] Visit Date: 10/22/2020              Requested by: Vivi Barrack, MD [address] [place],  [place] [address] PCP: Vivi Barrack, MD  No chief complaint on file.     HPI: Patient is a 75 year old woman who is 2 months status post left transmetatarsal amputation.  Patient is increasing her activity is increasing her protein intake she states she has some pain at the incision site.  Assessment & Plan: Visit Diagnoses:  1. 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[place])     Plan: Callus was pared the incision is well-healed patient is given a prescription for custom orthotics spacer and a carbon plate.  She will increase her activities as tolerated.  Follow-Up Instructions: Return if symptoms worsen or fail to improve.   Ortho Exam  Patient is alert, oriented, no adenopathy, well-dressed, normal affect, normal respiratory effort. Examination of the transmetatarsal amputation is well-healed there is no redness no cellulitis no drainage.  She does have a scab over the incision and this was debrided no signs of infection or wound breakdown.  Imaging: No results found. No images are attached to the encounter.  Labs: Lab Results  Component Value Date   HGBA1C 8.1 (H) 08/12/2020   HGBA1C 8.4 (H) 05/29/2020   HGBA1C 8.0 (A) 03/18/2020   ESRSEDRATE 51 (H) 12/24/2019   ESRSEDRATE 36 (H) 02/24/2017   CRP 13.2 (H) 12/24/2019   CRP 1.2 (H) 02/24/2017   LABURIC 5.5 09/01/2015   REPTSTATUS 06/03/2020 FINAL 05/29/2020   CULT  05/29/2020    NO GROWTH 5 DAYS Performed at [facility], [place] [address], [place], [place] [address]    LABORGA GROUP B STREP (S.AGALACTIAE) ISOLATED 12/26/2014     Lab Results  Component Value Date   ALBUMIN 3.8 09/08/2020   ALBUMIN 3.4 (L) 05/29/2020   ALBUMIN 3.6 12/26/2019   PREALBUMIN 19.4 03/05/2012    No results found  for: MG No results found for: VD25OH  Lab Results  Component Value Date   PREALBUMIN 19.4 03/05/2012   CBC EXTENDED Latest Ref Rng & Units 09/08/2020 08/12/2020 07/02/2020  WBC 4.0 - 10.5 K/uL 11.4(H) 9.8 11.7(H)  RBC 3.87 - 5.11 Mil/uL 3.91 4.02 4.18  HGB 12.0 - 15.0 g/dL 10.3(L) 10.8(L) 11.1(L)  HCT 36.0 - 46.0 % 31.3(L) 34.4(L) 35.5(L)  PLT 150.0 - 400.0 K/uL 444.0(H) 403(H) 383  NEUTROABS 1.4 - 7.7 K/uL 7.4 - 7.8(H)  LYMPHSABS 0.7 - 4.0 K/uL 3.0 - 2.6     There is no height or weight on file to calculate BMI.  Orders:  No orders of the defined types were placed in this encounter.  No orders of the defined types were placed in this encounter.    Procedures: No procedures performed  Clinical Data: No additional findings.  ROS:  All other systems negative, except as noted in the HPI. Review of Systems  Objective: Vital Signs: There were no vitals taken for this visit.  Specialty Comments:  No specialty comments available.  PMFS History: Patient Active Problem List   Diagnosis Date Noted   Abscess of left foot 08/12/2020   Memory loss 11/21/2019   Mild nonproliferative diabetic retinopathy of both eyes ([place]) 10/23/2019   Nuclear sclerotic cataract of both eyes  10/23/2019   Posterior vitreous detachment of right eye 10/23/2019   Retinal hemorrhage of left eye 10/23/2019   Hyperlipidemia associated with type 2 diabetes mellitus ([place]) 11/30/2017   Impingement syndrome of right shoulder 07/06/2017   Iron deficiency anemia 05/11/2017   Morbid obesity ([place]) 04/24/2017   Subacute osteomyelitis, left ankle and foot ([place])    Anemia 02/24/2017   Baker's cyst 07/10/2016   Onychomycosis 12/07/2015   Upper airway cough syndrome 03/05/2014   History of amputation of lesser toe of right foot ([place]) 04/15/2013   Hypertension associated with diabetes ([place]) 12/05/2006   Uncontrolled type 2 diabetes mellitus with peripheral circulatory disorder 10/12/2006   Dyslipidemia  associated with type 2 diabetes mellitus ([place]) 10/12/2006   Allergic rhinitis 10/12/2006   GERD - Followed by Dr. Fuller Plan 10/12/2006   IRRITABLE BOWEL SYNDROME - followed by Dr. Fuller Plan 10/12/2006   Peripheral neuropathy 10/11/2006   ALOPECIA NEC 10/11/2006   Past Medical History:  Diagnosis Date   Alopecia    Anemia, mild    Arthritis    Chronic cough    sees pulmonologist   Chronic pain    chest wall and abd - s/p extensive eval   Diabetes mellitus with neuropathy (HCC)    sees endocrine   Diverticulosis    Dyspnea    Fatty liver    GERD (gastroesophageal reflux disease)    takes Nexium bid, hx erosive esophagitis   Headache(784.0)    occasionally;r/t sinus    History of colon polyps    HTN (hypertension)    Hx of amputation of lesser toe (HCC)    sees podiatrist   Hyperlipemia    IBS (irritable bowel syndrome)    Insomnia    takes Elavil nightly   Joint pain    Neuropathy    Neuropathy    Osteomyelitis ([place])    Pneumonia    89/2/22- patient denies   PONV (postoperative nausea and vomiting)    medication did not help with surgery on 03/28/20   Seasonal allergies    takes Zyrtec daily   Sinus tachycardia     Family History  Problem Relation Age of Onset   Lung cancer Mother 36       smoked heavily   Emphysema Mother    Hypertension Father    Hyperlipidemia Father    Diabetes Father    Coronary artery disease Father    Dementia Father    COPD Father        smoked   Stomach cancer Paternal Aunt    Brain cancer Paternal Uncle    Irritable bowel syndrome Other        Several family members on fathers side    Diabetes Other    Stomach cancer Maternal Aunt    Lung cancer Paternal Uncle    Heart disease Paternal Uncle    Colon cancer Neg Hx     Past Surgical History:  Procedure Laterality Date   AMPUTATION  12/28/2011   Procedure: AMPUTATION DIGIT;  Surgeon: Newt Minion, MD;  Location: [place];  Service: Orthopedics;  Laterality: Right;  Right Foot 2nd Toe  Amputation at MTP (metatarsophalangeal joint)   AMPUTATION Right 04/25/2012   Procedure: Right Foot 3rd Toe Amputation;  Surgeon: Newt Minion, MD;  Location: [facility];  Service: Orthopedics;  Laterality: Right;  Right Foot Third Toe Amputation    AMPUTATION Right 07/27/2012   Procedure: Right 4th Toe Amputation at Metatarsophalangeal;  Surgeon: Newt Minion, MD;  Location:  [facility];  Service: Orthopedics;  Laterality: Right;  Right 4th Toe Amputation at Metatarsophalangeal   AMPUTATION Left 07/15/2016   Procedure: Left 2nd Ray Amputation;  Surgeon: Newt Minion, MD;  Location: [facility] [facility];  Service: Orthopedics;  Laterality: Left;   AMPUTATION Left 11/18/2016   Procedure: Left 3rd and 4th Ray Amputation;  Surgeon: Newt Minion, MD;  Location: [facility];  Service: Orthopedics;  Laterality: Left;   AMPUTATION Right 03/29/2017   Procedure: RIGHT FOOT 3RD AND 4TH RAY AMPUTATION;  Surgeon: Newt Minion, MD;  Location: [facility];  Service: Orthopedics;  Laterality: Right;   AMPUTATION Left 08/12/2020   Procedure: LEFT TRANSMETATARSAL AMPUTATION;  Surgeon: Newt Minion, MD;  Location: [facility];  Service: Orthopedics;  Laterality: Left;   APPLICATION OF WOUND VAC Left 08/12/2020   Procedure: APPLICATION OF WOUND VAC;  Surgeon: Newt Minion, MD;  Location: [facility];  Service: Orthopedics;  Laterality: Left;   COLONOSCOPY     LAPAROSCOPIC APPENDECTOMY  01/05/2011   Procedure: APPENDECTOMY LAPAROSCOPIC;  Surgeon: Pedro Earls, MD;  Location: [facility];  Service: General;  Laterality: N/A;   OOPHORECTOMY  2001   ROTATOR CUFF REPAIR Right    x 2   TUBAL LIGATION     VAGINAL HYSTERECTOMY  2001   Social History   Occupational History   Occupation: Retired   Tobacco Use   Smoking status: Never   Smokeless tobacco: Never  Vaping Use   Vaping Use: Never used  Substance and Sexual Activity   Alcohol use: Never   Drug use: Never   Sexual activity: Yes    Birth control/protection: Surgical

## 2020-10-24 ENCOUNTER — Other Ambulatory Visit: Payer: Self-pay | Admitting: Family Medicine

## 2020-10-28 ENCOUNTER — Ambulatory Visit (INDEPENDENT_AMBULATORY_CARE_PROVIDER_SITE_OTHER): Payer: Medicare Other | Admitting: Family Medicine

## 2020-10-28 ENCOUNTER — Encounter: Payer: Self-pay | Admitting: Family Medicine

## 2020-10-28 ENCOUNTER — Other Ambulatory Visit: Payer: Self-pay

## 2020-10-28 VITALS — BP 136/78 | HR 68 | Temp 97.9°F | Wt 225.8 lb

## 2020-10-28 DIAGNOSIS — Z89432 Acquired absence of left foot: Secondary | ICD-10-CM

## 2020-10-28 DIAGNOSIS — E785 Hyperlipidemia, unspecified: Secondary | ICD-10-CM | POA: Diagnosis not present

## 2020-10-28 DIAGNOSIS — I152 Hypertension secondary to endocrine disorders: Secondary | ICD-10-CM | POA: Diagnosis not present

## 2020-10-28 DIAGNOSIS — E1169 Type 2 diabetes mellitus with other specified complication: Secondary | ICD-10-CM

## 2020-10-28 DIAGNOSIS — D509 Iron deficiency anemia, unspecified: Secondary | ICD-10-CM | POA: Diagnosis not present

## 2020-10-28 DIAGNOSIS — E1159 Type 2 diabetes mellitus with other circulatory complications: Secondary | ICD-10-CM | POA: Diagnosis not present

## 2020-10-28 DIAGNOSIS — Z23 Encounter for immunization: Secondary | ICD-10-CM | POA: Diagnosis not present

## 2020-10-28 DIAGNOSIS — Z794 Long term (current) use of insulin: Secondary | ICD-10-CM | POA: Diagnosis not present

## 2020-10-28 DIAGNOSIS — K219 Gastro-esophageal reflux disease without esophagitis: Secondary | ICD-10-CM | POA: Diagnosis not present

## 2020-10-28 DIAGNOSIS — E669 Obesity, unspecified: Secondary | ICD-10-CM

## 2020-10-28 DIAGNOSIS — Z6836 Body mass index (BMI) 36.0-36.9, adult: Secondary | ICD-10-CM | POA: Diagnosis not present

## 2020-10-28 LAB — LIPID PANEL
Cholesterol: 127 mg/dL (ref 0–200)
HDL: 46.9 mg/dL (ref >39.00)
LDL Cholesterol: 47 mg/dL (ref 0–99)
NonHDL: 79.81
Total CHOL/HDL Ratio: 3
Triglycerides: 163 mg/dL — ABNORMAL HIGH (ref 0.0–149.0)
VLDL: 32.6 mg/dL (ref 0.0–40.0)

## 2020-10-28 MED ORDER — CETIRIZINE HCL 10 MG PO TABS: 10.0000 mg | ORAL_TABLET | Freq: Every day | ORAL | 3 refills | Status: DC

## 2020-10-28 MED ORDER — AZELASTINE HCL 0.1 % NA SOLN: 2.0000 | Freq: Two times a day (BID) | NASAL | 12 refills | Status: DC

## 2020-10-28 NOTE — Assessment & Plan Note (Signed)
She will be following up with hematology soon.

## 2020-10-28 NOTE — Assessment & Plan Note (Signed)
At goal.  Continue irbesartan-HCTZ 150-12.5 daily and metoprolol succinate 25 mg daily.

## 2020-10-28 NOTE — Assessment & Plan Note (Signed)
Doing well status post operation.  Still has some balance issues but is overall doing well.  No falls.  We will continue management per orthopedics.  She will be having prosthesis shoe fitted soon.

## 2020-10-28 NOTE — Progress Notes (Signed)
.  Chief Complaint:  Beth Holden is a 75 y.o. female who presents today for her annual comprehensive physical exam.    Assessment/Plan:  Chronic Problems Addressed Today: GERD - Followed by Dr. Haynes Kerns.  Continue omeprazole per GI.  Hypertension associated with diabetes ([place]) At goal.  Continue irbesartan-HCTZ 150-12.5 daily and metoprolol succinate 25 mg daily.  Dyslipidemia associated with type 2 diabetes mellitus (HCC) Check lipids.  She is on Crestor 20 mg daily.  T2DM (type 2 diabetes mellitus) ([place]) Follows with endocrinology. Last A1c 8.1  Iron deficiency anemia She will be following up with hematology soon.  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[place]) Doing well status post operation.  Still has some balance issues but is overall doing well.  No falls.  We will continue management per orthopedics.  She will be having prosthesis shoe fitted soon.   Preventative Healthcare: Check Labs. She will get mammogram done soon. Flu shot given today.   Patient Counseling(The following topics were reviewed and/or handout was given):  -Nutrition: Stressed importance of moderation in sodium/caffeine intake, saturated fat and cholesterol, caloric balance, sufficient intake of fresh fruits, vegetables, and fiber.  -Stressed the importance of regular exercise.   -Substance Abuse: Discussed cessation/primary prevention of tobacco, alcohol, or other drug use; driving or other dangerous activities under the influence; availability of treatment for abuse.   -Injury prevention: Discussed safety belts, safety helmets, smoke detector, smoking near bedding or upholstery.   -Sexuality: Discussed sexually transmitted diseases, partner selection, use of condoms, avoidance of unintended pregnancy and contraceptive alternatives.   -Dental health: Discussed importance of regular tooth brushing, flossing, and dental visits.  -Health maintenance and immunizations reviewed. Please refer  to Health maintenance section.  Return to care in 1 year for next preventative visit.     Subjective:  HPI:  She has no acute complaints today.   She states that she had a transmetatarsal amputation of her left foot August 6th. This has impacted her balance, so she is going to have a  shoe/shoe spacer block made to help rectify the problem.  In regards to the sores that were discussed in her last visit, she was given medication by her dermatologist, who stated that it was likely an allergic reaction due to doxycycline.   Since the general resolving of this issue, she has been going back to church which has improved her mood.  Also, she notes that her blood sugar levels have been notably fluctuating and variable, and was recommended an additional insulin. However, she refused, as she is looking for a solution that will not cost her 500-600 dollars as insurance will not cover the cost any more than that.  Also, she states that she has had infusions to keep her iron levels up.  She admits that she is still taking nasal sprays as needed to assist her breathing. Lab Results  Component Value Date   HGBA1C 8.1 (H) 08/12/2020   BP Readings from Last 3 Encounters:  10/28/20 136/78  09/30/20 120/66  09/17/20 128/76   Lab Results  Component Value Date   IRON 49 09/22/2017   TIBC 325 09/22/2017   FERRITIN 40 07/02/2020    Depression screen PHQ 2/9 10/28/2020  Decreased Interest 0  Down, Depressed, Hopeless 0  PHQ - 2 Score 0  Altered sleeping -  Tired, decreased energy -  Change in appetite -  Feeling bad or failure about yourself  -  Trouble concentrating -  Moving slowly or fidgety/restless -  Suicidal thoughts -  PHQ-9 Score -  Difficult doing work/chores -  Some recent data might be hidden    Health Maintenance Due  Topic Date Due   INFLUENZA VACCINE  08/10/2020     ROS: Per HPI, otherwise a complete review of systems was negative.   PMH:  The following were  reviewed and entered/updated in epic: Past Medical History:  Diagnosis Date   Alopecia    Anemia, mild    Arthritis    Chronic cough    sees pulmonologist   Chronic pain    chest wall and abd - s/p extensive eval   Diabetes mellitus with neuropathy ([place])    sees endocrine   Diverticulosis    Dyspnea    Fatty liver    GERD (gastroesophageal reflux disease)    takes Nexium bid, hx erosive esophagitis   Headache(784.0)    occasionally;r/t sinus    History of colon polyps    HTN (hypertension)    Hx of amputation of lesser toe (HCC)    sees podiatrist   Hyperlipemia    IBS (irritable bowel syndrome)    Insomnia    takes Elavil nightly   Joint pain    Neuropathy    Neuropathy    Osteomyelitis ([place])    Pneumonia    89/2/22- patient denies   PONV (postoperative nausea and vomiting)    medication did not help with surgery on 03/28/20   Seasonal allergies    takes Zyrtec daily   Sinus tachycardia    Patient Active Problem List   Diagnosis Date Noted   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[place]) 10/28/2020   Memory loss 11/21/2019   Mild nonproliferative diabetic retinopathy of both eyes ([place]) 10/23/2019   Nuclear sclerotic cataract of both eyes 10/23/2019   Posterior vitreous detachment of right eye 10/23/2019   Retinal hemorrhage of left eye 10/23/2019   Impingement syndrome of right shoulder 07/06/2017   Iron deficiency anemia 05/11/2017   Morbid obesity ([place]) 04/24/2017   Anemia 02/24/2017   Baker's cyst 07/10/2016   Onychomycosis 12/07/2015   Upper airway cough syndrome 03/05/2014   Hypertension associated with diabetes ([place]) 12/05/2006   T2DM (type 2 diabetes mellitus) ([place]) 10/12/2006   Dyslipidemia associated with type 2 diabetes mellitus ([place]) 10/12/2006   Allergic rhinitis 10/12/2006   GERD - Followed by Dr. Fuller Plan 10/12/2006   IRRITABLE BOWEL SYNDROME - followed by Dr. Fuller Plan 10/12/2006   Peripheral neuropathy 10/11/2006   ALOPECIA NEC  10/11/2006   Past Surgical History:  Procedure Laterality Date   AMPUTATION  12/28/2011   Procedure: AMPUTATION DIGIT;  Surgeon: Newt Minion, MD;  Location: [place] [facility] [facility];  Service: Orthopedics;  Laterality: Right;  Right Foot 2nd Toe Amputation at MTP (metatarsophalangeal joint)   AMPUTATION Right 04/25/2012   Procedure: Right Foot 3rd Toe Amputation;  Surgeon: Newt Minion, MD;  Location: [facility];  Service: Orthopedics;  Laterality: Right;  Right Foot Third Toe Amputation    AMPUTATION Right 07/27/2012   Procedure: Right 4th Toe Amputation at Metatarsophalangeal;  Surgeon: Newt Minion, MD;  Location: [facility];  Service: Orthopedics;  Laterality: Right;  Right 4th Toe Amputation at Metatarsophalangeal   AMPUTATION Left 07/15/2016   Procedure: Left 2nd Ray Amputation;  Surgeon: Newt Minion, MD;  Location: [facility];  Service: Orthopedics;  Laterality: Left;   AMPUTATION Left 11/18/2016   Procedure: Left 3rd and 4th Ray Amputation;  Surgeon: Newt Minion, MD;  Location: [facility];  Service: Orthopedics;  Laterality: Left;   AMPUTATION Right 03/29/2017   Procedure: RIGHT FOOT 3RD AND 4TH RAY AMPUTATION;  Surgeon: Newt Minion, MD;  Location: [facility];  Service: Orthopedics;  Laterality: Right;   AMPUTATION Left 08/12/2020   Procedure: LEFT TRANSMETATARSAL AMPUTATION;  Surgeon: Newt Minion, MD;  Location: [facility];  Service: Orthopedics;  Laterality: Left;   APPLICATION OF WOUND VAC Left 08/12/2020   Procedure: APPLICATION OF WOUND VAC;  Surgeon: Newt Minion, MD;  Location: [facility];  Service: Orthopedics;  Laterality: Left;   COLONOSCOPY     LAPAROSCOPIC APPENDECTOMY  01/05/2011   Procedure: APPENDECTOMY LAPAROSCOPIC;  Surgeon: Pedro Earls, MD;  [facility];  Service: General;  Laterality: N/A;   OOPHORECTOMY  2001   ROTATOR CUFF REPAIR Right    x 2   TUBAL LIGATION     VAGINAL HYSTERECTOMY  2001    Family History  Problem Relation Age of Onset   Lung cancer Mother 60       smoked heavily    Emphysema Mother    Hypertension Father    Hyperlipidemia Father    Diabetes Father    Coronary artery disease Father    Dementia Father    COPD Father        smoked   Stomach cancer Paternal Aunt    Brain cancer Paternal Uncle    Irritable bowel syndrome Other        Several family members on fathers side    Diabetes Other    Stomach cancer Maternal Aunt    Lung cancer Paternal Uncle    Heart disease Paternal Uncle    Colon cancer Neg Hx     Medications- reviewed and updated Current Outpatient Medications  Medication Sig Dispense Refill   acetaminophen (TYLENOL) 500 MG tablet Take 500 mg by mouth every 8 (eight) hours as needed for mild pain.      betamethasone dipropionate (DIPROLENE) 0.05 % cream Apply 1 application topically 2 (two) times daily as needed (IRRITATION). 30 g 0   Blood Glucose Monitoring Suppl (ONE TOUCH ULTRA 2) w/Device KIT Use to check blood sugar (Patient taking differently: 1 each by Other route daily.) 1 each 0   cholecalciferol (VITAMIN D) 1000 units tablet Take 1,000 Units by mouth daily.     Continuous Blood Gluc Receiver (FREESTYLE LIBRE 2 READER) DEVI 1 each by Does not apply route daily. 1 each 0   Continuous Blood Gluc Sensor (FREESTYLE LIBRE 2 SENSOR) MISC 1 each by Does not apply route every 14 (fourteen) days. 6 each 3   dicyclomine (BENTYL) 10 MG capsule Take 1 capsule (10 mg total) by mouth 3 (three) times daily as needed for spasms. 90 capsule 5   Fluocinolone Acetonide 0.01 % OIL Use 1-2 drops daily as needed. (Patient taking differently: Place 1-2 drops into both ears daily as needed (ear irritation).) 20 mL 0   gabapentin (NEURONTIN) 100 MG capsule TAKE 2 TO 3 CAPSULES AT BEDTIME (Patient taking differently: Take 200-300 mg by mouth at bedtime.) 180 capsule 3   glucagon (GLUCAGEN) 1 MG SOLR injection Inject 1 mg into the muscle once as needed for up to 1 dose for low blood sugar. 1 each 11   insulin degludec (TRESIBA FLEXTOUCH) 200 UNIT/ML  FlexTouch Pen Inject 34 Units into the skin daily. 12 mL 3   insulin NPH Human (NOVOLIN N RELION) 100 UNIT/ML injection Inject 0.15-0.2 mLs (15-20 Units total) into the skin See admin instructions.  Inject under skin 12-15 units in am  and 10-15 units at bedtime per sliding scale (Patient taking differently: Inject 15-20 Units into the skin See admin instructions. Inject under skin 12-15 units in am  and 10-15 units at bedtime per sliding scale)     insulin regular (NOVOLIN R RELION) 100 units/mL injection Inject 0.2-0.3 mLs (20-30 Units total) into the skin 3 (three) times daily before meals. Per sliding scale     Insulin Syringe-Needle U-100 (RELION INSULIN SYRINGE 1ML/31G) 31G X 5/16" 1 ML MISC USE 2 TIMES A DAY (Patient taking differently: 1 each by Other route 2 (two) times daily.) 100 each 2   irbesartan-hydrochlorothiazide (AVALIDE) 150-12.5 MG tablet Take 1 tablet by mouth daily. 90 tablet 3   metFORMIN (GLUCOPHAGE) 1000 MG tablet Take 1 tablet (1,000 mg total) by mouth 2 (two) times daily with a meal. 180 tablet 3   metoprolol succinate (TOPROL-XL) 25 MG 24 hr tablet Take 1 tablet (25 mg total) by mouth daily. 90 tablet 3   nutrition supplement, JUVEN, (JUVEN) PACK Take 1 packet by mouth 2 (two) times daily between meals.  0   omeprazole (PRILOSEC) 40 MG capsule Take 1 capsule (40 mg total) by mouth 2 (two) times daily. 180 capsule 3   ondansetron (ZOFRAN ODT) 4 MG disintegrating tablet Take 1 tablet (4 mg total) by mouth every 4 (four) hours as needed for nausea or vomiting. 30 tablet 1   ONETOUCH ULTRA test strip USE  STRIP TO CHECK GLUCOSE THREE TIMES DAILY 300 each 3   OVER THE COUNTER MEDICATION Apply 1-3 application topically at bedtime as needed (foot pain). TOPRICIN FOOT CREAM - NEUROPATHY FOOT CREAM **APPLIES TO BOTH FEET AT BEDTIME**     Probiotic Product (PROBIOTIC DAILY PO) Take 1 capsule by mouth daily.     rosuvastatin (CRESTOR) 20 MG tablet Take 1 tablet (20 mg total) by mouth  daily. 90 tablet 3   vitamin B-12 (CYANOCOBALAMIN) 1000 MCG tablet Take 1,000 mcg daily by mouth.     vitamin C (ASCORBIC ACID) 500 MG tablet Take 500 mg by mouth daily.     zinc sulfate 220 (50 Zn) MG capsule Take 1 capsule (220 mg total) by mouth daily.     azelastine (ASTELIN) 0.1 % nasal spray Place 2 sprays into both nostrils 2 (two) times daily. 30 mL 12   cetirizine (ZYRTEC) 10 MG tablet Take 1 tablet (10 mg total) by mouth daily. 90 tablet 3   No current facility-administered medications for this visit.    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   Codeine Other (See Comments)    Makes her crazy   Propoxyphene Hcl Itching    *DARVOCET     Social History   Socioeconomic History   Marital status: Married    Spouse name: Not on file   Number of children: 2   Years of education: Not on file   Highest education level: Not on file  Occupational History   Occupation: Retired   Tobacco Use   Smoking status: Never   Smokeless tobacco: Never  Vaping Use   Vaping Use: Never used  Substance and Sexual Activity   Alcohol use: Never   Drug use: Never   Sexual activity: Yes    Birth control/protection: Surgical  Other Topics Concern   Not on file  Social History Narrative   Caffeine daily    HSG, [employer] no  diploma   Married '66   1 dtr- '78; 1 son '71; 2 grandchildren   Occupation: retired 04   Dad with alzheimers-had to place in [place] (summer '10)         Social Determinants of Radio broadcast assistant Strain: Not on Art therapist Insecurity: Not on file  Transportation Needs: Not on file  Physical Activity: Not on file  Stress: Not on file  Social Connections: Not on file        Objective:  Physical Exam: BP 136/78   Pulse 68   Temp 97.9 F (36.6 C) (Temporal)   Wt 225 lb 12.8 oz (102.4 kg)   SpO2 99%   BMI 36.45 kg/m   Body mass index is 36.45 kg/m. Wt Readings from  Last 3 Encounters:  10/28/20 225 lb 12.8 oz (102.4 kg)  09/30/20 223 lb 6.4 oz (101.3 kg)  09/17/20 223 lb (101.2 kg)   Gen: NAD, resting comfortably HEENT: TMs normal bilaterally. OP clear. No thyromegaly noted.  CV: RRR with no murmurs appreciated Pulm: NWOB, CTAB with no crackles, wheezes, or rhonchi GI: Normal bowel sounds present. Soft, Nontender, Nondistended. MSK: no edema, cyanosis, or clubbing noted Skin: warm, dry Neuro: CN2-12 grossly intact. Strength 5/5 in upper and lower extremities. Reflexes symmetric and intact bilaterally.  Psych: Normal affect and thought content     I,Jordan Kelly,acting as a scribe for Dimas Chyle, MD.,have documented all relevant documentation on the behalf of Dimas Chyle, MD,as directed by  Dimas Chyle, MD while in the presence of Dimas Chyle, MD.  I, Dimas Chyle, MD, have reviewed all documentation for this visit. The documentation on 10/28/20 for the exam, diagnosis, procedures, and orders are all accurate and complete.  Time Spent: 50 minutes of total time was spent on the date of the encounter performing the following actions: chart review prior to seeing the patient including her recent visits with specialists, obtaining history, performing a medically necessary exam, counseling on the treatment plan, placing orders, and documenting in our EHR.   Algis Greenhouse. Jerline Pain, MD 10/28/2020 10:47 AM

## 2020-10-28 NOTE — Assessment & Plan Note (Signed)
Check lipids.  She is on Crestor 20 mg daily. 

## 2020-10-28 NOTE — Patient Instructions (Signed)
It was very nice to see you today!  We will give you your flu shot today.  We will check your cholesterol levels.  We will refill your medications.  I will see back in year.  Please come back to see me sooner if needed.  Take care, Dr Jerline Pain  PLEASE NOTE:  If you had any lab tests please let us know if you have not heard back within a few days. You may see your results on mychart before we have a chance to review them but we will give you a call once they are reviewed by [place]. If we ordered any referrals today, please let us know if you have not heard from their office within the next week.   Please try these tips to maintain a healthy lifestyle:  Eat at least 3 REAL meals and 1-2 snacks per day.  Aim for no more than 5 hours between eating.  If you eat breakfast, please do so within one hour of getting up.   Each meal should contain half fruits/vegetables, one quarter protein, and one quarter carbs (no bigger than a computer mouse)  Cut down on sweet beverages. This includes juice, soda, and sweet tea.   Drink at least 1 glass of water with each meal and aim for at least 8 glasses per day  Exercise at least 150 minutes every week.

## 2020-10-28 NOTE — Assessment & Plan Note (Addendum)
Follows with endocrinology. Last A1c 8.1

## 2020-10-28 NOTE — Assessment & Plan Note (Signed)
Stable.  Continue omeprazole per GI.

## 2020-10-29 NOTE — Progress Notes (Signed)
Hi Dr Harrington Challenger. FYI on lipids for this mutual patient. She asked me to forward to you. Thanks! -CMP

## 2020-11-04 ENCOUNTER — Other Ambulatory Visit: Payer: Self-pay | Admitting: Family Medicine

## 2020-11-04 DIAGNOSIS — Z1231 Encounter for screening mammogram for malignant neoplasm of breast: Secondary | ICD-10-CM

## 2020-11-05 ENCOUNTER — Inpatient Hospital Stay: Payer: Medicare Other | Attending: Oncology | Admitting: Oncology

## 2020-11-05 ENCOUNTER — Inpatient Hospital Stay: Payer: Medicare Other

## 2020-11-05 ENCOUNTER — Other Ambulatory Visit: Payer: Medicare Other

## 2020-11-05 ENCOUNTER — Other Ambulatory Visit: Payer: Self-pay

## 2020-11-05 VITALS — BP 140/60 | HR 79 | Temp 98.7°F | Resp 20 | Ht 66.0 in | Wt 225.6 lb

## 2020-11-05 DIAGNOSIS — E119 Type 2 diabetes mellitus without complications: Secondary | ICD-10-CM | POA: Insufficient documentation

## 2020-11-05 DIAGNOSIS — I10 Essential (primary) hypertension: Secondary | ICD-10-CM | POA: Insufficient documentation

## 2020-11-05 DIAGNOSIS — D509 Iron deficiency anemia, unspecified: Secondary | ICD-10-CM | POA: Insufficient documentation

## 2020-11-05 LAB — CBC WITH DIFFERENTIAL (CANCER CENTER ONLY)
Abs Immature Granulocytes: 0.04 10*3/uL (ref 0.00–0.07)
Basophils Absolute: 0.1 10*3/uL (ref 0.0–0.1)
Basophils Relative: 1 %
Eosinophils Absolute: 0.3 10*3/uL (ref 0.0–0.5)
Eosinophils Relative: 2 %
HCT: 33.8 % — ABNORMAL LOW (ref 36.0–46.0)
Hemoglobin: 10.5 g/dL — ABNORMAL LOW (ref 12.0–15.0)
Immature Granulocytes: 0 %
Lymphocytes Relative: 26 %
Lymphs Abs: 3 10*3/uL (ref 0.7–4.0)
MCH: 25.5 pg — ABNORMAL LOW (ref 26.0–34.0)
MCHC: 31.1 g/dL (ref 30.0–36.0)
MCV: 82.2 fL (ref 80.0–100.0)
Monocytes Absolute: 0.8 10*3/uL (ref 0.1–1.0)
Monocytes Relative: 7 %
Neutro Abs: 7.3 10*3/uL (ref 1.7–7.7)
Neutrophils Relative %: 64 %
Platelet Count: 409 10*3/uL — ABNORMAL HIGH (ref 150–400)
RBC: 4.11 MIL/uL (ref 3.87–5.11)
RDW: 14.2 % (ref 11.5–15.5)
WBC Count: 11.4 10*3/uL — ABNORMAL HIGH (ref 4.0–10.5)
nRBC: 0 % (ref 0.0–0.2)

## 2020-11-05 LAB — FERRITIN: Ferritin: 30 ng/mL (ref 11–307)

## 2020-11-05 NOTE — Progress Notes (Signed)
  [facility] OFFICE PROGRESS NOTE   Diagnosis: Anemia  INTERVAL HISTORY:   Beth Holden returns as scheduled.  She had COVID-19 in July.  She underwent a left transmetatarsal amputation in August for treatment of left foot osteomyelitis.  No bleeding.  She is not taking iron.  Reflux symptoms have improved.  Objective:  Vital signs in last 24 hours:  Blood pressure 140/60, pulse 79, temperature 98.7 F (37.1 C), temperature source Oral, resp. rate 20, height 5\' 6"  (1.676 m), weight 225 lb 9.6 oz (102.3 kg), SpO2 98 %.    Lymphatics: No cervical, supraclavicular, or axillary nodes Resp: Lungs clear bilaterally Cardio: Regular rate and rhythm GI: No hepatosplenomegaly, nontender Vascular: No leg edema   Lab Results:  Lab Results  Component Value Date   WBC 11.4 (H) 11/05/2020   HGB 10.5 (L) 11/05/2020   HCT 33.8 (L) 11/05/2020   MCV 82.2 11/05/2020   PLT 409 (H) 11/05/2020   NEUTROABS 7.3 11/05/2020    CMP  Lab Results  Component Value Date   NA 137 09/08/2020   K 3.9 09/08/2020   CL 98 09/08/2020   CO2 30 09/08/2020   GLUCOSE 124 (H) 09/08/2020   BUN 27 (H) 09/08/2020   CREATININE 0.98 09/08/2020   CALCIUM 9.9 09/08/2020   PROT 7.5 09/08/2020   ALBUMIN 3.8 09/08/2020   AST 11 09/08/2020   ALT 6 09/08/2020   ALKPHOS 41 09/08/2020   BILITOT 0.3 09/08/2020   GFRNONAA 50 (L) 08/12/2020   GFRAA 78 11/23/2018    Medications: I have reviewed the patient's current medications.   Assessment/Plan: Anemia secondary to iron deficiency, possible GI blood loss related to gastritis status post IV ferrous gluconate weekly x4 beginning 03/13/2017, single dose of IV ferrous gluconate 05/22/2017.  Hemoglobin/MCV corrected into normal range. Upper endoscopy 02/15/2017-diffuse moderate inflammation characterized by congestion, erythema, friability and granularity in the gastric body, posterior wall of the stomach and gastric antrum.  Biopsy GASTRIC ANTRAL MUCOSA WITH  NON-SPECIFIC REACTIVE GASTROPATHY AND FEW SUBEPITHELIAL CRYSTALLINE IRON DEPOSITS, CONSISTENT WITH IRON PILL GASTROPATHY. GASTRIC OXYNTIC MUCOSA WITH PARIETAL CELL HYPERPLASIA AS CAN BE SEEN IN HYPERGASTRINEMIC STATES SUCH AS PPI THERAPY. WARTHIN-STARRY STAIN IS NEGATIVE FOR HELICOBACTER PYLORI Colonoscopy 02/15/2017-6 mm polyp in the transverse colon (TUBULAR ADENOMA. NEGATIVE FOR HIGH GRADE DYSPLASIA OR MALIGNANCY) Diabetes Hypertension Mild neutrophilia- chronic, white count dating at least to 2012 on review of epic chart Small hemoglobin on repeat urinalyses-seen by urology.     Disposition: Beth Holden appears stable.  She has persistent mild anemia.  We will follow-up on the ferritin level from today.  The anemia may be related to iron deficiency with ongoing blood loss from gastritis versus another etiology.  She could have anemia of chronic disease and recent surgery may be contributing.  She will return for an office visit and CBC in 3 months.  We will initiate additional diagnostic evaluation if she develops progressive anemia.  Beth Coder, MD  11/05/2020  8:55 AM

## 2020-11-09 ENCOUNTER — Telehealth: Payer: Self-pay

## 2020-11-09 NOTE — Telephone Encounter (Signed)
Pt verbalized understanding. Informed to give the office a call if  she has any signs of anemia.discussed signs and symptoms of anemia with Pt. Pt verbalized understanding

## 2020-11-09 NOTE — Telephone Encounter (Signed)
-----   Message from Ladell Pier, MD sent at 11/06/2020  3:54 PM EDT ----- Please call patient, iron level is at the low end of the normal range, call for symptoms of anemia, follow-up for office visit and CBC/ferritin as scheduled

## 2020-11-20 DIAGNOSIS — Z23 Encounter for immunization: Secondary | ICD-10-CM | POA: Diagnosis not present

## 2020-11-29 ENCOUNTER — Other Ambulatory Visit: Payer: Self-pay | Admitting: Family Medicine

## 2020-11-29 ENCOUNTER — Other Ambulatory Visit: Payer: Self-pay | Admitting: Internal Medicine

## 2020-11-30 ENCOUNTER — Encounter: Payer: Self-pay | Admitting: Family Medicine

## 2020-12-14 ENCOUNTER — Ambulatory Visit
Admission: RE | Admit: 2020-12-14 | Discharge: 2020-12-14 | Disposition: A | Discharge status: Home / Self Care | Payer: Medicare Other | Source: Ambulatory Visit | Attending: Family Medicine | Admitting: Family Medicine

## 2020-12-14 ENCOUNTER — Other Ambulatory Visit: Payer: Self-pay

## 2020-12-14 ENCOUNTER — Ambulatory Visit (INDEPENDENT_AMBULATORY_CARE_PROVIDER_SITE_OTHER): Payer: Medicare Other | Admitting: Orthopedic Surgery

## 2020-12-14 DIAGNOSIS — Z89432 Acquired absence of left foot: Secondary | ICD-10-CM | POA: Diagnosis not present

## 2020-12-14 DIAGNOSIS — Z1231 Encounter for screening mammogram for malignant neoplasm of breast: Secondary | ICD-10-CM | POA: Diagnosis not present

## 2020-12-14 DIAGNOSIS — B351 Tinea unguium: Secondary | ICD-10-CM

## 2020-12-14 DIAGNOSIS — M79671 Pain in right foot: Secondary | ICD-10-CM | POA: Diagnosis not present

## 2020-12-15 ENCOUNTER — Encounter: Payer: Self-pay | Admitting: Orthopedic Surgery

## 2020-12-15 DIAGNOSIS — Z20822 Contact with and (suspected) exposure to covid-19: Secondary | ICD-10-CM | POA: Diagnosis not present

## 2020-12-15 NOTE — Progress Notes (Signed)
Office Visit Note   [name]           Date of Birth: 06/18/1945           MRN: [id number] Visit Date: 12/14/2020              Requested by: Vivi Barrack, MD [address] [place],  [place] [address] PCP: Vivi Barrack, MD  Chief Complaint  Patient presents with   Right Foot - Nail Problem    5th toenail is black      HPI: Patient is a 75 year old woman who is seen here for initial evaluation for nail changes of the right foot fifth toe.  Patient states she is very off balance fell this morning downstairs.  Patient states that the filler and plate in her shoe did not help with her balance.  Assessment & Plan: Visit Diagnoses:  1. 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[place])   2. Onychomycosis   3. Pain in right foot     Plan: Nails were trimmed x2 callus pared of the left transmetatarsal amputation.  Continue with her compression socks no restrictions at this time.  Follow-Up Instructions: Return in about 3 months (around 03/14/2021).   Ortho Exam  Patient is alert, oriented, no adenopathy, well-dressed, normal affect, normal respiratory effort. Examination patient has some hypertrophic callus on the left transmetatarsal amputation this was pared without complications no signs of infection.  Examination of the right foot she does have a subungual hematoma of the right little toe appears to be secondary to blunt trauma no signs of infection no ischemic changes.  Nails were trimmed x2 without complications.  Imaging: No results found. No images are attached to the encounter.  Labs: Lab Results  Component Value Date   HGBA1C 8.1 (H) 08/12/2020   HGBA1C 8.4 (H) 05/29/2020   HGBA1C 8.0 (A) 03/18/2020   ESRSEDRATE 51 (H) 12/24/2019   ESRSEDRATE 36 (H) 02/24/2017   CRP 13.2 (H) 12/24/2019   CRP 1.2 (H) 02/24/2017   LABURIC 5.5 09/01/2015   REPTSTATUS 06/03/2020 FINAL 05/29/2020   CULT  05/29/2020    NO GROWTH 5 DAYS Performed at [facility], [place] [address], [place], [place] [address]    LABORGA GROUP B STREP (S.AGALACTIAE) ISOLATED 12/26/2014     Lab Results  Component Value Date   ALBUMIN 3.8 09/08/2020   ALBUMIN 3.4 (L) 05/29/2020   ALBUMIN 3.6 12/26/2019   PREALBUMIN 19.4 03/05/2012    No results found for: MG No results found for: VD25OH  Lab Results  Component Value Date   PREALBUMIN 19.4 03/05/2012   CBC EXTENDED Latest Ref Rng & Units 11/05/2020 09/08/2020 08/12/2020  WBC 4.0 - 10.5 K/uL 11.4(H) 11.4(H) 9.8  RBC 3.87 - 5.11 MIL/uL 4.11 3.91 4.02  HGB 12.0 - 15.0 g/dL 10.5(L) 10.3(L) 10.8(L)  HCT 36.0 - 46.0 % 33.8(L) 31.3(L) 34.4(L)  PLT 150 - 400 K/uL 409(H) 444.0(H) 403(H)  NEUTROABS 1.7 - 7.7 K/uL 7.3 7.4 -  LYMPHSABS 0.7 - 4.0 K/uL 3.0 3.0 -     There is no height or weight on file to calculate BMI.  Orders:  No orders of the defined types were placed in this encounter.  No orders of the defined types were placed in this encounter.    Procedures: No procedures performed  Clinical Data: No additional findings.  ROS:  All other systems negative, except as noted in the HPI. Review of Systems  Objective: Vital Signs:  There were no vitals taken for this visit.  Specialty Comments:  No specialty comments available.  PMFS History: Patient Active Problem List   Diagnosis Date Noted   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[place]) 10/28/2020   Memory loss 11/21/2019   Mild nonproliferative diabetic retinopathy of both eyes ([place]) 10/23/2019   Nuclear sclerotic cataract of both eyes 10/23/2019   Posterior vitreous detachment of right eye 10/23/2019   Retinal hemorrhage of left eye 10/23/2019   Impingement syndrome of right shoulder 07/06/2017   Iron deficiency anemia 05/11/2017   Morbid obesity ([place]) 04/24/2017   Anemia 02/24/2017   Baker's cyst 07/10/2016   Onychomycosis 12/07/2015   Upper airway cough syndrome 03/05/2014   Hypertension associated with  diabetes ([place]) 12/05/2006   T2DM (type 2 diabetes mellitus) ([place]) 10/12/2006   Dyslipidemia associated with type 2 diabetes mellitus ([address]) 10/12/2006   Allergic rhinitis 10/12/2006   GERD - Followed by Dr. Fuller Plan 10/12/2006   IRRITABLE BOWEL SYNDROME - followed by Dr. Fuller Plan 10/12/2006   Peripheral neuropathy 10/11/2006   ALOPECIA NEC 10/11/2006   Past Medical History:  Diagnosis Date   Alopecia    Anemia, mild    Arthritis    Chronic cough    sees pulmonologist   Chronic pain    chest wall and abd - s/p extensive eval   Diabetes mellitus with neuropathy (HCC)    sees endocrine   Diverticulosis    Dyspnea    Fatty liver    GERD (gastroesophageal reflux disease)    takes Nexium bid, hx erosive esophagitis   Headache(784.0)    occasionally;r/t sinus    History of colon polyps    HTN (hypertension)    Hx of amputation of lesser toe (HCC)    sees podiatrist   Hyperlipemia    IBS (irritable bowel syndrome)    Insomnia    takes Elavil nightly   Joint pain    Neuropathy    Neuropathy    Osteomyelitis ([place])    Pneumonia    89/2/22- patient denies   PONV (postoperative nausea and vomiting)    medication did not help with surgery on 03/28/20   Seasonal allergies    takes Zyrtec daily   Sinus tachycardia     Family History  Problem Relation Age of Onset   Lung cancer Mother 4       smoked heavily   Emphysema Mother    Hypertension Father    Hyperlipidemia Father    Diabetes Father    Coronary artery disease Father    Dementia Father    COPD Father        smoked   Stomach cancer Paternal Aunt    Brain cancer Paternal Uncle    Irritable bowel syndrome Other        Several family members on fathers side    Diabetes Other    Stomach cancer Maternal Aunt    Lung cancer Paternal Uncle    Heart disease Paternal Uncle    Colon cancer Neg Hx     Past Surgical History:  Procedure Laterality Date   AMPUTATION  12/28/2011   Procedure: AMPUTATION DIGIT;  Surgeon: Newt Minion, MD;  Location: [facility];  Service: Orthopedics;  Laterality: Right;  Right Foot 2nd Toe Amputation at MTP (metatarsophalangeal joint)   AMPUTATION Right 04/25/2012   Procedure: Right Foot 3rd Toe Amputation;  Surgeon: Newt Minion, MD;  Location: [facility];  Service: Orthopedics;  Laterality: Right;  Right Foot  Third Toe Amputation    AMPUTATION Right 07/27/2012   Procedure: Right 4th Toe Amputation at Metatarsophalangeal;  Surgeon: Newt Minion, MD;  Location: [facility];  Service: Orthopedics;  Laterality: Right;  Right 4th Toe Amputation at Metatarsophalangeal   AMPUTATION Left 07/15/2016   Procedure: Left 2nd Ray Amputation;  Surgeon: Newt Minion, MD;  Location: [facility];  Service: Orthopedics;  Laterality: Left;   AMPUTATION Left 11/18/2016   Procedure: Left 3rd and 4th Ray Amputation;  Surgeon: Newt Minion, MD;  Location: [facility];  Service: Orthopedics;  Laterality: Left;   AMPUTATION Right 03/29/2017   Procedure: RIGHT FOOT 3RD AND 4TH RAY AMPUTATION;  Surgeon: Newt Minion, MD;  Location: [facility];  Service: Orthopedics;  Laterality: Right;   AMPUTATION Left 08/12/2020   Procedure: LEFT TRANSMETATARSAL AMPUTATION;  Surgeon: Newt Minion, MD;  Location: [facility];  Service: Orthopedics;  Laterality: Left;   APPLICATION OF WOUND VAC Left 08/12/2020   Procedure: APPLICATION OF WOUND VAC;  Surgeon: Newt Minion, MD;  Location: [facility];  Service: Orthopedics;  Laterality: Left;   COLONOSCOPY     LAPAROSCOPIC APPENDECTOMY  01/05/2011   Procedure: APPENDECTOMY LAPAROSCOPIC;  Surgeon: Pedro Earls, MD;  Location: [facility];  Service: General;  Laterality: N/A;   OOPHORECTOMY  2001   ROTATOR CUFF REPAIR Right    x 2   TUBAL LIGATION     VAGINAL HYSTERECTOMY  2001   Social History   Occupational History   Occupation: Retired   Tobacco Use   Smoking status: Never   Smokeless tobacco: Never  Vaping Use   Vaping Use: Never used  Substance and Sexual Activity   Alcohol use: Never   Drug use: Never    Sexual activity: Yes    Birth control/protection: Surgical

## 2020-12-16 ENCOUNTER — Ambulatory Visit: Payer: Medicare Other | Admitting: Internal Medicine

## 2020-12-16 ENCOUNTER — Encounter: Payer: Self-pay | Admitting: Family Medicine

## 2020-12-17 NOTE — Telephone Encounter (Signed)
Patient has called into the office in regard to message.  I have scheduled patient to be seen with [facility] 12/9 at 4pm.  Patient would like to know if there is anything OTC that she can take until the appt.

## 2020-12-17 NOTE — Telephone Encounter (Signed)
See note

## 2020-12-18 ENCOUNTER — Encounter: Payer: Self-pay | Admitting: Family

## 2020-12-18 ENCOUNTER — Ambulatory Visit (INDEPENDENT_AMBULATORY_CARE_PROVIDER_SITE_OTHER): Payer: Medicare Other | Admitting: Family

## 2020-12-18 ENCOUNTER — Other Ambulatory Visit: Payer: Self-pay

## 2020-12-18 ENCOUNTER — Ambulatory Visit (INDEPENDENT_AMBULATORY_CARE_PROVIDER_SITE_OTHER)
Admission: RE | Admit: 2020-12-18 | Discharge: 2020-12-18 | Disposition: A | Discharge status: Home / Self Care | Payer: Medicare Other | Source: Ambulatory Visit | Attending: Family | Admitting: Family

## 2020-12-18 VITALS — BP 143/66 | HR 71 | Temp 98.1°F | Ht 66.0 in | Wt 225.6 lb

## 2020-12-18 DIAGNOSIS — M533 Sacrococcygeal disorders, not elsewhere classified: Secondary | ICD-10-CM

## 2020-12-18 NOTE — Patient Instructions (Signed)
It was very nice to see you today!  I have sent the order for your xray to the [facility] location. They close at 5pm. We will notify you of the results. Go to [facility] or like medical supply store to purchase a "coccyx cushion" with the back cut out and use this with any sitting.   PLEASE NOTE:  If you had any lab tests please let us know if you have not heard back within a few days. You may see your results on MyChart before we have a chance to review them but we will give you a call once they are reviewed by [place]. If we ordered any referrals today, please let us know if you have not heard from their office within the next week.   Please try these tips to maintain a healthy lifestyle:  Eat most of your calories during the day when you are active. Eliminate processed foods including packaged sweets (pies, cakes, cookies), reduce intake of potatoes, white bread, white pasta, and white rice. Look for whole grain options, oat flour or almond flour.  Each meal should contain half fruits/vegetables, one quarter protein, and one quarter carbs (no bigger than a computer mouse).  Cut down on sweet beverages. This includes juice, soda, and sweet tea. Also watch fruit intake, though this is a healthier sweet option, it still contains natural sugar! Limit to 3 servings daily.  Drink at least 1 glass of water with each meal and aim for at least 8 glasses per day  Exercise at least 150 minutes every week.

## 2020-12-18 NOTE — Assessment & Plan Note (Addendum)
Reports falling 4 days ago onto her bottom/tailbone, severe pain with sitting and sometimes if she moves her legs a certain way. Has taken tylenol, Ibuprofen with little relief, left over hydrocodone helped slightly but doesn't like how it makes her feel. Advised on purchasing a coccyx cushion from local medical supply store and using this everywhere she sits. Try ice or heat for 20 minutes up to 3-4 times/day for pain. Sending for xray today.

## 2020-12-18 NOTE — Progress Notes (Signed)
Subjective:     Patient ID: Beth Holden, female    DOB: 18-Jun-1945, 75 y.o.   MRN: [id number]  Chief Complaint  Patient presents with   Fall    Monday; She fell in her driveway. She says that she is better than when it first happened, but is still very sore. She has used a heating pad, and hydrocodone's from a recent procedure.    Leg Pain    Bilateral    HPI: Pain She reports new onset tailbone pain. was an injury that may have caused the pain. The pain started  4 days ago and is staying constant. The pain does not radiate. The pain is described as stabbing, throbbing, and sharp, is 9/10 in intensity, occurring intermittently. Symptoms are worse sometimes when walking, moving legs in a certain way, always with sitting. Aggravating factors: sitting She has tried acetaminophen, NSAIDs, and prescription pain relievers with little relief.    Health Maintenance Due  Topic Date Due   OPHTHALMOLOGY EXAM  10/30/2020    Past Medical History:  Diagnosis Date   Alopecia    Anemia, mild    Arthritis    Chronic cough    sees pulmonologist   Chronic pain    chest wall and abd - s/p extensive eval   Diabetes mellitus with neuropathy (HCC)    sees endocrine   Diverticulosis    Dyspnea    Fatty liver    GERD (gastroesophageal reflux disease)    takes Nexium bid, hx erosive esophagitis   Headache(784.0)    occasionally;r/t sinus    History of colon polyps    HTN (hypertension)    Hx of amputation of lesser toe (HCC)    sees podiatrist   Hyperlipemia    IBS (irritable bowel syndrome)    Insomnia    takes Elavil nightly   Joint pain    Neuropathy    Neuropathy    Osteomyelitis ([place])    Pneumonia    89/2/22- patient denies   PONV (postoperative nausea and vomiting)    medication did not help with surgery on 03/28/20   Seasonal allergies    takes Zyrtec daily   Sinus tachycardia     Past Surgical History:  Procedure Laterality Date   AMPUTATION  12/28/2011    Procedure: AMPUTATION DIGIT;  Surgeon: Newt Minion, MD;  Location: [facility];  Service: Orthopedics;  Laterality: Right;  Right Foot 2nd Toe Amputation at MTP (metatarsophalangeal joint)   AMPUTATION Right 04/25/2012   Procedure: Right Foot 3rd Toe Amputation;  Surgeon: Newt Minion, MD;  Location: [facility];  Service: Orthopedics;  Laterality: Right;  Right Foot Third Toe Amputation    AMPUTATION Right 07/27/2012   Procedure: Right 4th Toe Amputation at Metatarsophalangeal;  Surgeon: Newt Minion, MD;  Location: [facility];  Service: Orthopedics;  Laterality: Right;  Right 4th Toe Amputation at Metatarsophalangeal   AMPUTATION Left 07/15/2016   Procedure: Left 2nd Ray Amputation;  Surgeon: Newt Minion, MD;  Location: [facility];  Service: Orthopedics;  Laterality: Left;   AMPUTATION Left 11/18/2016   Procedure: Left 3rd and 4th Ray Amputation;  Surgeon: Newt Minion, MD;  Location: [facility];  Service: Orthopedics;  Laterality: Left;   AMPUTATION Right 03/29/2017   Procedure: RIGHT FOOT 3RD AND 4TH RAY AMPUTATION;  Surgeon: Newt Minion, MD;  Location: [facility];  Service: Orthopedics;  Laterality: Right;   AMPUTATION Left 08/12/2020   Procedure: LEFT TRANSMETATARSAL AMPUTATION;  Surgeon: Sharol Given,  Illene Regulus, MD;  Location: [facility];  Service: Orthopedics;  Laterality: Left;   APPLICATION OF WOUND VAC Left 08/12/2020   Procedure: APPLICATION OF WOUND VAC;  Surgeon: Newt Minion, MD;  Location: [facility];  Service: Orthopedics;  Laterality: Left;   COLONOSCOPY     LAPAROSCOPIC APPENDECTOMY  01/05/2011   Procedure: APPENDECTOMY LAPAROSCOPIC;  Surgeon: Pedro Earls, MD;  [facility];  Service: General;  Laterality: N/A;   OOPHORECTOMY  2001   ROTATOR CUFF REPAIR Right    x 2   TUBAL LIGATION     VAGINAL HYSTERECTOMY  2001    Outpatient Medications Prior to Visit  Medication Sig Dispense Refill   acetaminophen (TYLENOL) 500 MG tablet Take 500 mg by mouth every 8 (eight) hours as needed for mild pain.      azelastine  (ASTELIN) 0.1 % nasal spray Place 2 sprays into both nostrils 2 (two) times daily. 30 mL 12   betamethasone dipropionate (DIPROLENE) 0.05 % cream Apply 1 application topically 2 (two) times daily as needed (IRRITATION). 30 g 0   Blood Glucose Monitoring Suppl (ONE TOUCH ULTRA 2) w/Device KIT Use to check blood sugar (Patient taking differently: 1 each by Other route daily.) 1 each 0   cetirizine (ZYRTEC) 10 MG tablet Take 1 tablet (10 mg total) by mouth daily. 90 tablet 3   cholecalciferol (VITAMIN D) 1000 units tablet Take 1,000 Units by mouth daily.     dicyclomine (BENTYL) 10 MG capsule Take 1 capsule (10 mg total) by mouth 3 (three) times daily as needed for spasms. 90 capsule 5   famotidine (PEPCID) 20 MG tablet      Fluocinolone Acetonide 0.01 % OIL Use 1-2 drops daily as needed. (Patient taking differently: Place 1-2 drops into both ears daily as needed (ear irritation).) 20 mL 0   gabapentin (NEURONTIN) 100 MG capsule TAKE 2 TO 3 CAPSULES AT BEDTIME (Patient taking differently: Take 200-300 mg by mouth at bedtime.) 180 capsule 3   glucagon (GLUCAGEN) 1 MG SOLR injection Inject 1 mg into the muscle once as needed for up to 1 dose for low blood sugar. 1 each 11   insulin NPH Human (NOVOLIN N RELION) 100 UNIT/ML injection Inject 0.15-0.2 mLs (15-20 Units total) into the skin See admin instructions. Inject under skin 12-15 units in am  and 10-15 units at bedtime per sliding scale (Patient taking differently: Inject 15-20 Units into the skin See admin instructions. Inject under skin 12-15 units in am  and 10-15 units at bedtime per sliding scale)     insulin regular (NOVOLIN R RELION) 100 units/mL injection Inject 0.2-0.3 mLs (20-30 Units total) into the skin 3 (three) times daily before meals. Per sliding scale     Insulin Syringe-Needle U-100 (RELION INSULIN SYRINGE 1ML/31G) 31G X 5/16" 1 ML MISC USE 2 TIMES A DAY (Patient taking differently: 1 each by Other route 2 (two) times daily.) 100 each 2    irbesartan-hydrochlorothiazide (AVALIDE) 150-12.5 MG tablet TAKE 1 TABLET DAILY 90 tablet 3   metFORMIN (GLUCOPHAGE) 1000 MG tablet Take 1 tablet (1,000 mg total) by mouth 2 (two) times daily with a meal. 180 tablet 3   metoprolol succinate (TOPROL-XL) 25 MG 24 hr tablet TAKE 1 TABLET DAILY 30 tablet 0   nutrition supplement, JUVEN, (JUVEN) PACK Take 1 packet by mouth 2 (two) times daily between meals.  0   omeprazole (PRILOSEC) 40 MG capsule Take 1 capsule (40 mg total) by mouth 2 (two) times  daily. 180 capsule 3   ONETOUCH ULTRA test strip USE  STRIP TO CHECK GLUCOSE THREE TIMES DAILY 300 each 3   OVER THE COUNTER MEDICATION Apply 1-3 application topically at bedtime as needed (foot pain). TOPRICIN FOOT CREAM - NEUROPATHY FOOT CREAM **APPLIES TO BOTH FEET AT BEDTIME**     Probiotic Product (PROBIOTIC DAILY PO) Take 1 capsule by mouth daily.     rosuvastatin (CRESTOR) 20 MG tablet Take 1 tablet (20 mg total) by mouth daily. 90 tablet 3   vitamin B-12 (CYANOCOBALAMIN) 1000 MCG tablet Take 1,000 mcg daily by mouth.     vitamin C (ASCORBIC ACID) 500 MG tablet Take 500 mg by mouth daily.     Continuous Blood Gluc Receiver (FREESTYLE LIBRE 2 READER) DEVI 1 each by Does not apply route daily. 1 each 0   Continuous Blood Gluc Sensor (FREESTYLE LIBRE 2 SENSOR) MISC 1 each by Does not apply route every 14 (fourteen) days. 6 each 3   insulin degludec (TRESIBA FLEXTOUCH) 200 UNIT/ML FlexTouch Pen Inject 34 Units into the skin daily. (Patient not taking: Reported on 11/05/2020) 12 mL 3   ondansetron (ZOFRAN ODT) 4 MG disintegrating tablet Take 1 tablet (4 mg total) by mouth every 4 (four) hours as needed for nausea or vomiting. 30 tablet 1   zinc sulfate 220 (50 Zn) MG capsule Take 1 capsule (220 mg total) by mouth daily.     No facility-administered medications prior to visit.    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  Constitutional:      Appearance: Normal appearance.  Cardiovascular:     Rate and Rhythm: Normal rate and regular rhythm.  Pulmonary:     Effort: Pulmonary effort is normal.     Breath sounds: Normal breath sounds.  Musculoskeletal:        General: Normal range of motion.       Back:     Comments: Sharp, guarding pain with coccyx palpation, slight bruising noted to the right of coccyx bone, also right buttuck tender to palpation, no bruising.  Skin:    General: Skin is warm and dry.  Neurological:     Mental Status: She is alert.  Psychiatric:        Mood and Affect: Mood normal.        Behavior: Behavior normal.    BP (!) 143/66   Pulse 71   Temp 98.1 F (36.7 C) (Temporal)   Ht '5\' 6"'  (1.676 m)   Wt 225 lb 9.6 oz (102.3 kg)   SpO2 97%   BMI 36.41 kg/m  Wt Readings from Last 3 Encounters:  12/18/20 225 lb 9.6 oz (102.3 kg)  11/05/20 225 lb 9.6 oz (102.3 kg)  10/28/20 225 lb 12.8 oz (102.4 kg)       Assessment & Plan:   Problem List Items Addressed This Visit       Other   Coccyx pain - Primary    Reports falling 4 days ago onto her bottom/tailbone, severe pain with sitting and sometimes if she moves her legs a certain way. Has taken tylenol, Ibuprofen with little relief, left over hydrocodone helped slightly but doesn't like how it makes her feel.  Advised on purchasing a coccyx cushion from local medical supply store and using this everywhere she sits. Try ice or heat for 20 minutes up to 3-4 times/day for pain. Sending for xray today.      Relevant Orders   DG Sacrum/Coccyx

## 2021-01-06 ENCOUNTER — Telehealth: Payer: Self-pay | Admitting: Family Medicine

## 2021-01-06 NOTE — Progress Notes (Signed)
°  Care Management   Follow Up Note   01/06/2021 Name: Beth Holden MRN: [id number] DOB: 1945/12/21   Referred by: Vivi Barrack, MD Reason for referral : No chief complaint on file.   Successful contact was made with the patient to discuss care management and care coordination services. Patient declines engagement at this time.   Follow Up Plan: No further follow up required:    [place]

## 2021-01-20 ENCOUNTER — Ambulatory Visit: Payer: Medicare Other | Admitting: Internal Medicine

## 2021-01-21 ENCOUNTER — Other Ambulatory Visit: Payer: Self-pay

## 2021-01-21 ENCOUNTER — Encounter: Payer: Self-pay | Admitting: Orthopedic Surgery

## 2021-01-21 ENCOUNTER — Ambulatory Visit: Payer: Medicare Other | Admitting: Orthopedic Surgery

## 2021-01-21 ENCOUNTER — Ambulatory Visit (INDEPENDENT_AMBULATORY_CARE_PROVIDER_SITE_OTHER): Payer: Medicare Other | Admitting: Orthopedic Surgery

## 2021-01-21 DIAGNOSIS — Z89432 Acquired absence of left foot: Secondary | ICD-10-CM | POA: Diagnosis not present

## 2021-01-21 DIAGNOSIS — L97511 Non-pressure chronic ulcer of other part of right foot limited to breakdown of skin: Secondary | ICD-10-CM | POA: Diagnosis not present

## 2021-01-21 NOTE — Progress Notes (Signed)
Office Visit Note   [name]           Date of Birth: 01-24-1945           MRN: [id number] Visit Date: 01/21/2021              Requested by: Vivi Barrack, [address] [place],  [place] [address] PCP: Vivi Barrack, MD  Chief Complaint  Patient presents with   Right Foot - Pain      HPI: Patient is a 76 year old woman who presents with a painful callus over the residual limb left transmetatarsal amputation and a new ulcer beneath the fourth metatarsal head of the right foot she has new custom multi density orthotics.  Assessment & Plan: Visit Diagnoses:  1. 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[place])   2. Non-pressure chronic ulcer of other part of right foot limited to breakdown of skin ([place])     Plan: A cut out was made in her orthotic on the right to unload the fourth metatarsal head.  The callus was pared on the left foot.  Follow-Up Instructions: Return if symptoms worsen or fail to improve.   Ortho Exam  Patient is alert, oriented, no adenopathy, well-dressed, normal affect, normal respiratory effort. 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  Patient's orthotic was removed and a cut out was made on the plantar aspect of the orthotic to unload the fourth metatarsal head.  There is no cellulitis no signs of infection.  She has hypertrophic callus over the medial border of the transmetatarsal amputation on the left.  This callus was pared without complication soft tissue was touched with silver nitrate no signs of infection.  Imaging: No results found. No images are attached to the encounter.  Labs: Lab Results  Component Value Date   HGBA1C 8.1 (H) 08/12/2020   HGBA1C 8.4 (H) 05/29/2020   HGBA1C 8.0 (A) 03/18/2020   ESRSEDRATE 51 (H) 12/24/2019   ESRSEDRATE 36 (H) 02/24/2017   CRP  13.2 (H) 12/24/2019   CRP 1.2 (H) 02/24/2017   LABURIC 5.5 09/01/2015   REPTSTATUS 06/03/2020 FINAL 05/29/2020   CULT  05/29/2020    NO GROWTH 5 DAYS Performed at [facility], [place] [address], [place], [place] [address]    LABORGA GROUP B STREP (S.AGALACTIAE) ISOLATED 12/26/2014     Lab Results  Component Value Date   ALBUMIN 3.8 09/08/2020   ALBUMIN 3.4 (L) 05/29/2020   ALBUMIN 3.6 12/26/2019   PREALBUMIN 19.4 03/05/2012    No results found for: MG No results found for: VD25OH  Lab Results  Component Value Date   PREALBUMIN 19.4 03/05/2012   CBC EXTENDED Latest Ref Rng & Units 11/05/2020 09/08/2020 08/12/2020  WBC 4.0 - 10.5 K/uL 11.4(H) 11.4(H) 9.8  RBC 3.87 - 5.11 MIL/uL 4.11 3.91 4.02  HGB 12.0 - 15.0 g/dL 10.5(L) 10.3(L) 10.8(L)  HCT 36.0 - 46.0 % 33.8(L) 31.3(L) 34.4(L)  PLT 150 - 400 K/uL 409(H) 444.0(H) 403(H)  NEUTROABS 1.7 - 7.7 K/uL 7.3 7.4 -  LYMPHSABS 0.7 - 4.0 K/uL 3.0 3.0 -     There is no height or weight on file to calculate BMI.  Orders:  No orders of the defined types were placed in this encounter.  No orders of the defined types were placed in this encounter.  Procedures: No procedures performed  Clinical Data: No additional findings.  ROS:  All other systems negative, except as noted in the HPI. Review of Systems  Objective: Vital Signs: There were no vitals taken for this visit.  Specialty Comments:  No specialty comments available.  PMFS History: Patient Active Problem List   Diagnosis Date Noted   Coccyx pain 12/18/2020   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[place]) 10/28/2020   Memory loss 11/21/2019   Mild nonproliferative diabetic retinopathy of both eyes ([place]) 10/23/2019   Nuclear sclerotic cataract of both eyes 10/23/2019   Posterior vitreous detachment of right eye 10/23/2019   Retinal hemorrhage of left eye 10/23/2019   Impingement syndrome of right shoulder 07/06/2017   Iron deficiency anemia  05/11/2017   Morbid obesity ([place]) 04/24/2017   Anemia 02/24/2017   [place]aker's cyst 07/10/2016   Onychomycosis 12/07/2015   Upper airway cough syndrome 03/05/2014   Hypertension associated with diabetes ([place]) 12/05/2006   T2DM (type 2 diabetes mellitus) ([place]) 10/12/2006   Dyslipidemia associated with type 2 diabetes mellitus ([place]) 10/12/2006   Allergic rhinitis 10/12/2006   GERD - Followed by Dr. Fuller Plan 10/12/2006   IRRITABLE BOWEL SYNDROME - followed by Dr. Fuller Plan 10/12/2006   Peripheral neuropathy 10/11/2006   ALOPECIA NEC 10/11/2006   Past Medical History:  Diagnosis Date   Alopecia    Anemia, mild    Arthritis    Chronic cough    sees pulmonologist   Chronic pain    chest wall and abd - s/p extensive eval   Diabetes mellitus with neuropathy (HCC)    sees endocrine   Diverticulosis    Dyspnea    Fatty liver    GERD (gastroesophageal reflux disease)    takes Nexium bid, hx erosive esophagitis   Headache(784.0)    occasionally;r/t sinus    History of colon polyps    HTN (hypertension)    Hx of amputation of lesser toe (HCC)    sees podiatrist   Hyperlipemia    IBS (irritable bowel syndrome)    Insomnia    takes Elavil nightly   Joint pain    Neuropathy    Neuropathy    Osteomyelitis ([place])    Pneumonia    89/2/22- patient denies   PONV (postoperative nausea and vomiting)    medication did not help with surgery on 03/28/20   Seasonal allergies    takes Zyrtec daily   Sinus tachycardia     Family History  Problem Relation Age of Onset   Lung cancer Mother 82       smoked heavily   Emphysema Mother    Hypertension Father    Hyperlipidemia Father    Diabetes Father    Coronary artery disease Father    Dementia Father    COPD Father        smoked   Stomach cancer Paternal Aunt    Brain cancer Paternal Uncle    Irritable bowel syndrome Other        Several family members on fathers side    Diabetes Other    Stomach cancer Maternal Aunt    Lung cancer Paternal  Uncle    Heart disease Paternal Uncle    Colon cancer Neg Hx     Past Surgical History:  Procedure Laterality Date   AMPUTATION  12/28/2011   Procedure: AMPUTATION DIGIT;  Surgeon: Newt Minion, MD;  Location: [place];  Service: Orthopedics;  Laterality: Right;  Right Foot 2nd Toe Amputation at  MTP (metatarsophalangeal joint)   AMPUTATION Right 04/25/2012   Procedure: Right Foot 3rd Toe Amputation;  Surgeon: Newt Minion, MD;  Location: [facility];  Service: Orthopedics;  Laterality: Right;  Right Foot Third Toe Amputation    AMPUTATION Right 07/27/2012   Procedure: Right 4th Toe Amputation at Metatarsophalangeal;  Surgeon: Newt Minion, MD;  Location: [facility];  Service: Orthopedics;  Laterality: Right;  Right 4th Toe Amputation at Metatarsophalangeal   AMPUTATION Left 07/15/2016   Procedure: Left 2nd Ray Amputation;  Surgeon: Newt Minion, MD;  Location: [facility];  Service: Orthopedics;  Laterality: Left;   AMPUTATION Left 11/18/2016   Procedure: Left 3rd and 4th Ray Amputation;  Surgeon: Newt Minion, MD;  Location: [facility];  Service: Orthopedics;  Laterality: Left;   AMPUTATION Right 03/29/2017   Procedure: RIGHT FOOT 3RD AND 4TH RAY AMPUTATION;  Surgeon: Newt Minion, MD;  Location: [facility];  Service: Orthopedics;  Laterality: Right;   AMPUTATION Left 08/12/2020   Procedure: LEFT TRANSMETATARSAL AMPUTATION;  Surgeon: Newt Minion, MD;  Location: [facility];  Service: Orthopedics;  Laterality: Left;   APPLICATION OF WOUND VAC Left 08/12/2020   Procedure: APPLICATION OF WOUND VAC;  Surgeon: Newt Minion, MD;  Location: [facility];  Service: Orthopedics;  Laterality: Left;   COLONOSCOPY     LAPAROSCOPIC APPENDECTOMY  01/05/2011   Procedure: APPENDECTOMY LAPAROSCOPIC;  Surgeon: Pedro Earls, MD;  [facility];  Service: General;  Laterality: N/A;   OOPHORECTOMY  2001   ROTATOR CUFF REPAIR Right    x 2   TUBAL LIGATION     VAGINAL HYSTERECTOMY  2001   Social History   Occupational History    Occupation: Retired   Tobacco Use   Smoking status: Never   Smokeless tobacco: Never  Vaping Use   Vaping Use: Never used  Substance and Sexual Activity   Alcohol use: Never   Drug use: Never   Sexual activity: Yes    Birth control/protection: Surgical

## 2021-01-25 ENCOUNTER — Encounter: Payer: Self-pay | Admitting: Family Medicine

## 2021-01-25 ENCOUNTER — Encounter: Payer: Self-pay | Admitting: Internal Medicine

## 2021-01-25 ENCOUNTER — Other Ambulatory Visit: Payer: Self-pay | Admitting: *Deleted

## 2021-01-25 DIAGNOSIS — E1165 Type 2 diabetes mellitus with hyperglycemia: Secondary | ICD-10-CM

## 2021-01-25 DIAGNOSIS — Z794 Long term (current) use of insulin: Secondary | ICD-10-CM

## 2021-01-25 DIAGNOSIS — E1159 Type 2 diabetes mellitus with other circulatory complications: Secondary | ICD-10-CM

## 2021-01-25 MED ORDER — AZELASTINE HCL 0.1 % NA SOLN: 2.0000 | Freq: Two times a day (BID) | NASAL | 12 refills | Status: DC

## 2021-01-25 MED ORDER — IRBESARTAN-HYDROCHLOROTHIAZIDE 150-12.5 MG PO TABS: 1.0000 | ORAL_TABLET | Freq: Every day | ORAL | 3 refills | Status: DC

## 2021-01-25 MED ORDER — CETIRIZINE HCL 10 MG PO TABS: 10.0000 mg | ORAL_TABLET | Freq: Every day | ORAL | 3 refills | Status: DC

## 2021-01-26 ENCOUNTER — Other Ambulatory Visit: Payer: Self-pay | Admitting: *Deleted

## 2021-01-26 MED ORDER — CETIRIZINE HCL 10 MG PO TABS: 10.0000 mg | ORAL_TABLET | Freq: Every day | ORAL | 3 refills | Status: DC

## 2021-01-26 MED ORDER — IRBESARTAN-HYDROCHLOROTHIAZIDE 150-12.5 MG PO TABS: 1.0000 | ORAL_TABLET | Freq: Every day | ORAL | 3 refills | Status: DC

## 2021-01-26 MED ORDER — GABAPENTIN 100 MG PO CAPS: ORAL_CAPSULE | ORAL | 3 refills | Status: DC

## 2021-01-26 MED ORDER — ROSUVASTATIN CALCIUM 20 MG PO TABS: 20.0000 mg | ORAL_TABLET | Freq: Every day | ORAL | 3 refills | Status: DC

## 2021-01-26 MED ORDER — METFORMIN HCL 1000 MG PO TABS: 1000.0000 mg | ORAL_TABLET | Freq: Two times a day (BID) | ORAL | 3 refills | Status: DC

## 2021-01-26 MED ORDER — METOPROLOL SUCCINATE ER 25 MG PO TB24: 25.0000 mg | ORAL_TABLET | Freq: Every day | ORAL | 0 refills | Status: DC

## 2021-01-27 MED ORDER — OMEPRAZOLE 40 MG PO CPDR: 40.0000 mg | DELAYED_RELEASE_CAPSULE | Freq: Two times a day (BID) | ORAL | 3 refills | Status: DC

## 2021-01-27 MED ORDER — GABAPENTIN 100 MG PO CAPS: ORAL_CAPSULE | ORAL | 3 refills | Status: DC

## 2021-01-27 MED ORDER — METFORMIN HCL 1000 MG PO TABS: 1000.0000 mg | ORAL_TABLET | Freq: Two times a day (BID) | ORAL | 3 refills | Status: DC

## 2021-01-27 NOTE — Addendum Note (Signed)
Addended by: Sarina Ill on: 01/27/2021 08:43 AM   Modules accepted: Orders

## 2021-02-02 ENCOUNTER — Telehealth: Payer: Self-pay | Admitting: Orthopedic Surgery

## 2021-02-02 NOTE — Telephone Encounter (Signed)
Made apt for pt tomorrow morning at 8:30

## 2021-02-02 NOTE — Telephone Encounter (Signed)
Pt called asking for a CB from [facility] in regards to the new ulcer that's come up on her foot. Pt states the last time something like this happened she had to get part of her foot removed.   [phone]

## 2021-02-03 ENCOUNTER — Ambulatory Visit (INDEPENDENT_AMBULATORY_CARE_PROVIDER_SITE_OTHER): Payer: Medicare Other | Admitting: Family

## 2021-02-03 DIAGNOSIS — L97511 Non-pressure chronic ulcer of other part of right foot limited to breakdown of skin: Secondary | ICD-10-CM

## 2021-02-03 NOTE — Progress Notes (Signed)
Office Visit Note   [name]           Date of Birth: [birth year]           MRN: [id number] Visit Date: 02/03/2021              Requested by: Vivi Barrack, MD [address] [place],  [place] [address] PCP: Vivi Barrack, MD  Chief Complaint  Patient presents with   Right Foot - Pain      HPI: The patient is a 76 year old woman who was well-known to our office who presents today concerned for callused ulceration with color change beneath her right foot.  She has been wearing her extra-depth shoes with orthotics and an insert which has been modified to offload the second and third metatarsal heads which is the area of her ulcer.  Assessment & Plan: Visit Diagnoses: No diagnosis found.  Plan: She will continue with her daily Dial soap cleansing.  Dry dressings continue with her corrective shoewear she will follow-up with Dr. Sharol Given as scheduled discussed return precautions should she have any change in color, redness, drainage she will call or return sooner  Follow-Up Instructions: No follow-ups on file.   Ortho Exam  Patient is alert, oriented, no adenopathy, well-dressed, normal affect, normal respiratory effort. On examination of the right foot beneath the second metatarsal head she does have callused ulceration this is a quarter sized in diameter there is no open area after debridement there is 3 mm in diameter ulceration there is no drainage there is some dried hematoma.  There is no odor no sign of infection  Imaging: No results found. No images are attached to the encounter.  Labs: Lab Results  Component Value Date   HGBA1C 8.1 (H) 08/12/2020   HGBA1C 8.4 (H) 05/29/2020   HGBA1C 8.0 (A) 03/18/2020   ESRSEDRATE 51 (H) 12/24/2019   ESRSEDRATE 36 (H) 02/24/2017   CRP 13.2 (H) 12/24/2019   CRP 1.2 (H) 02/24/2017   LABURIC 5.5 09/01/2015   REPTSTATUS 06/03/2020 FINAL 05/29/2020   CULT  05/29/2020    NO GROWTH 5 DAYS Performed at [facility], [place] [address] [place], [place] [address]    LABORGA GROUP B STREP (S.AGALACTIAE) ISOLATED 12/26/2014     Lab Results  Component Value Date   ALBUMIN 3.8 09/08/2020   ALBUMIN 3.4 (L) 05/29/2020   ALBUMIN 3.6 12/26/2019   PREALBUMIN 19.4 03/05/2012    No results found for: MG No results found for: VD25OH  Lab Results  Component Value Date   PREALBUMIN 19.4 03/05/2012   CBC EXTENDED Latest Ref Rng & Units 11/05/2020 09/08/2020 08/12/2020  WBC 4.0 - 10.5 K/uL 11.4(H) 11.4(H) 9.8  RBC 3.87 - 5.11 MIL/uL 4.11 3.91 4.02  HGB 12.0 - 15.0 g/dL 10.5(L) 10.3(L) 10.8(L)  HCT 36.0 - 46.0 % 33.8(L) 31.3(L) 34.4(L)  PLT 150 - 400 K/uL 409(H) 444.0(H) 403(H)  NEUTROABS 1.7 - 7.7 K/uL 7.3 7.4 -  LYMPHSABS 0.7 - 4.0 K/uL 3.0 3.0 -     There is no height or weight on file to calculate BMI.  Orders:  No orders of the defined types were placed in this encounter.  No orders of the defined types were placed in this encounter.    Procedures: No procedures performed  Clinical Data: No additional findings.  ROS:  All other systems negative, except as noted in the HPI. Review of Systems  Objective: Vital Signs: There were no vitals taken for  this visit.  Specialty Comments:  No specialty comments available.  PMFS History: Patient Active Problem List   Diagnosis Date Noted   Coccyx pain 12/18/2020   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[place]) 10/28/2020   Memory loss 11/21/2019   Mild nonproliferative diabetic retinopathy of both eyes ([place]) 10/23/2019   Nuclear sclerotic cataract of both eyes 10/23/2019   Posterior vitreous detachment of right eye 10/23/2019   Retinal hemorrhage of left eye 10/23/2019   Impingement syndrome of right shoulder 07/06/2017   Iron deficiency anemia 05/11/2017   Morbid obesity ([place]) 04/24/2017   Anemia 02/24/2017   Baker's cyst 07/10/2016   Onychomycosis 12/07/2015   Upper airway cough syndrome 03/05/2014   Hypertension associated  with diabetes ([place]) 12/05/2006   T2DM (type 2 diabetes mellitus) ([place]) 10/12/2006   Dyslipidemia associated with type 2 diabetes mellitus ([place]) 10/12/2006   Allergic rhinitis 10/12/2006   GERD - Followed by Dr. Fuller Plan 10/12/2006   IRRITABLE BOWEL SYNDROME - followed by Dr. Fuller Plan 10/12/2006   Peripheral neuropathy 10/11/2006   ALOPECIA NEC 10/11/2006   Past Medical History:  Diagnosis Date   Alopecia    Anemia, mild    Arthritis    Chronic cough    sees pulmonologist   Chronic pain    chest wall and abd - s/p extensive eval   Diabetes mellitus with neuropathy (HCC)    sees endocrine   Diverticulosis    Dyspnea    Fatty liver    GERD (gastroesophageal reflux disease)    takes Nexium bid, hx erosive esophagitis   Headache(784.0)    occasionally;r/t sinus    History of colon polyps    HTN (hypertension)    Hx of amputation of lesser toe (HCC)    sees podiatrist   Hyperlipemia    IBS (irritable bowel syndrome)    Insomnia    takes Elavil nightly   Joint pain    Neuropathy    Neuropathy    Osteomyelitis ([place])    Pneumonia    89/2/22- patient denies   PONV (postoperative nausea and vomiting)    medication did not help with surgery on 03/28/20   Seasonal allergies    takes Zyrtec daily   Sinus tachycardia     Family History  Problem Relation Age of Onset   Lung cancer Mother 74       smoked heavily   Emphysema Mother    Hypertension Father    Hyperlipidemia Father    Diabetes Father    Coronary artery disease Father    Dementia Father    COPD Father        smoked   Stomach cancer Paternal Aunt    Brain cancer Paternal Uncle    Irritable bowel syndrome Other        Several family members on fathers side    Diabetes Other    Stomach cancer Maternal Aunt    Lung cancer Paternal Uncle    Heart disease Paternal Uncle    Colon cancer Neg Hx     Past Surgical History:  Procedure Laterality Date   AMPUTATION  12/28/2011   Procedure: AMPUTATION DIGIT;  Surgeon:  Newt Minion, MD;  Location: [facility];  Service: Orthopedics;  Laterality: Right;  Right Foot 2nd Toe Amputation at MTP (metatarsophalangeal joint)   AMPUTATION Right 04/25/2012   Procedure: Right Foot 3rd Toe Amputation;  Surgeon: Newt Minion, MD;  Location: [facility];  Service: Orthopedics;  Laterality: Right;  Right Foot Third  Toe Amputation    AMPUTATION Right 07/27/2012   Procedure: Right 4th Toe Amputation at Metatarsophalangeal;  Surgeon: Newt Minion, MD;  Location: [facility];  Service: Orthopedics;  Laterality: Right;  Right 4th Toe Amputation at Metatarsophalangeal   AMPUTATION Left 07/15/2016   Procedure: Left 2nd Ray Amputation;  Surgeon: Newt Minion, MD;  Location: [facility];  Service: Orthopedics;  Laterality: Left;   AMPUTATION Left 11/18/2016   Procedure: Left 3rd and 4th Ray Amputation;  Surgeon: Newt Minion, MD;  Location: [facility];  Service: Orthopedics;  Laterality: Left;   AMPUTATION Right 03/29/2017   Procedure: RIGHT FOOT 3RD AND 4TH RAY AMPUTATION;  Surgeon: Newt Minion, MD;  Location: [facility];  Service: Orthopedics;  Laterality: Right;   AMPUTATION Left 08/12/2020   Procedure: LEFT TRANSMETATARSAL AMPUTATION;  Surgeon: Newt Minion, MD;  Location: [facility];  Service: Orthopedics;  Laterality: Left;   APPLICATION OF WOUND VAC Left 08/12/2020   Procedure: APPLICATION OF WOUND VAC;  Surgeon: Newt Minion, MD;  Location: [facility];  Service: Orthopedics;  Laterality: Left;   COLONOSCOPY     LAPAROSCOPIC APPENDECTOMY  01/05/2011   Procedure: APPENDECTOMY LAPAROSCOPIC;  Surgeon: Pedro Earls, MD;  Location: [facility];  Service: General;  Laterality: N/A;   OOPHORECTOMY  2001   ROTATOR CUFF REPAIR Right    x 2   TUBAL LIGATION     VAGINAL HYSTERECTOMY  2001   Social History   Occupational History   Occupation: Retired   Tobacco Use   Smoking status: Never   Smokeless tobacco: Never  Vaping Use   Vaping Use: Never used  Substance and Sexual Activity   Alcohol use: Never   Drug use:  Never   Sexual activity: Yes    Birth control/protection: Surgical

## 2021-02-04 ENCOUNTER — Other Ambulatory Visit: Payer: Self-pay

## 2021-02-04 ENCOUNTER — Encounter: Payer: Self-pay | Admitting: Nurse Practitioner

## 2021-02-04 ENCOUNTER — Inpatient Hospital Stay: Payer: Medicare Other | Attending: Oncology

## 2021-02-04 ENCOUNTER — Inpatient Hospital Stay (HOSPITAL_BASED_OUTPATIENT_CLINIC_OR_DEPARTMENT_OTHER): Payer: Medicare Other | Admitting: Nurse Practitioner

## 2021-02-04 VITALS — BP 125/58 | HR 83 | Temp 97.8°F | Resp 18 | Ht 66.0 in | Wt 227.6 lb

## 2021-02-04 DIAGNOSIS — D509 Iron deficiency anemia, unspecified: Secondary | ICD-10-CM | POA: Insufficient documentation

## 2021-02-04 DIAGNOSIS — E119 Type 2 diabetes mellitus without complications: Secondary | ICD-10-CM | POA: Diagnosis not present

## 2021-02-04 DIAGNOSIS — I10 Essential (primary) hypertension: Secondary | ICD-10-CM | POA: Diagnosis not present

## 2021-02-04 LAB — CBC WITH DIFFERENTIAL (CANCER CENTER ONLY)
Abs Immature Granulocytes: 0.04 10*3/uL (ref 0.00–0.07)
Basophils Absolute: 0.1 10*3/uL (ref 0.0–0.1)
Basophils Relative: 1 %
Eosinophils Absolute: 0.3 10*3/uL (ref 0.0–0.5)
Eosinophils Relative: 2 %
HCT: 34.7 % — ABNORMAL LOW (ref 36.0–46.0)
Hemoglobin: 10.9 g/dL — ABNORMAL LOW (ref 12.0–15.0)
Immature Granulocytes: 0 %
Lymphocytes Relative: 21 %
Lymphs Abs: 2.7 10*3/uL (ref 0.7–4.0)
MCH: 25.9 pg — ABNORMAL LOW (ref 26.0–34.0)
MCHC: 31.4 g/dL (ref 30.0–36.0)
MCV: 82.4 fL (ref 80.0–100.0)
Monocytes Absolute: 0.7 10*3/uL (ref 0.1–1.0)
Monocytes Relative: 6 %
Neutro Abs: 9.1 10*3/uL — ABNORMAL HIGH (ref 1.7–7.7)
Neutrophils Relative %: 70 %
Platelet Count: 410 10*3/uL — ABNORMAL HIGH (ref 150–400)
RBC: 4.21 MIL/uL (ref 3.87–5.11)
RDW: 14.7 % (ref 11.5–15.5)
WBC Count: 12.8 10*3/uL — ABNORMAL HIGH (ref 4.0–10.5)
nRBC: 0 % (ref 0.0–0.2)

## 2021-02-04 LAB — FERRITIN: Ferritin: 27 ng/mL (ref 11–307)

## 2021-02-04 NOTE — Progress Notes (Signed)
°  [facility] OFFICE PROGRESS NOTE   Diagnosis: Anemia  INTERVAL HISTORY:   Beth Holden returns as scheduled.  She denies bleeding.  She is no longer taking oral iron.  She has occasional dyspnea on exertion.  She complains of fatigue.  She has a good appetite.  Objective:  Vital signs in last 24 hours:  Blood pressure (!) 125/58, pulse 83, temperature 97.8 F (36.6 C), temperature source Oral, resp. rate 18, height 5\' 6"  (1.676 m), weight 227 lb 9.6 oz (103.2 kg), SpO2 98 %.    HEENT: No thrush or ulcers. Resp: Lungs clear bilaterally. Cardio: Regular rate and rhythm. GI: Abdomen soft and nontender.  No hepatosplenomegaly. Vascular: No leg edema.  Lab Results:  Lab Results  Component Value Date   WBC 12.8 (H) 02/04/2021   HGB 10.9 (L) 02/04/2021   HCT 34.7 (L) 02/04/2021   MCV 82.4 02/04/2021   PLT 410 (H) 02/04/2021   NEUTROABS 9.1 (H) 02/04/2021    Imaging:  No results found.  Medications: I have reviewed the patient's current medications.  Assessment/Plan: Anemia secondary to iron deficiency, possible GI blood loss related to gastritis status post IV ferrous gluconate weekly x4 beginning 03/13/2017, single dose of IV ferrous gluconate 05/22/2017.  Hemoglobin/MCV corrected into normal range. Upper endoscopy 02/15/2017-diffuse moderate inflammation characterized by congestion, erythema, friability and granularity in the gastric body, posterior wall of the stomach and gastric antrum.  Biopsy GASTRIC ANTRAL MUCOSA WITH NON-SPECIFIC REACTIVE GASTROPATHY AND FEW SUBEPITHELIAL CRYSTALLINE IRON DEPOSITS, CONSISTENT WITH IRON PILL GASTROPATHY. GASTRIC OXYNTIC MUCOSA WITH PARIETAL CELL HYPERPLASIA AS CAN BE SEEN IN HYPERGASTRINEMIC STATES SUCH AS PPI THERAPY. WARTHIN-STARRY STAIN IS NEGATIVE FOR HELICOBACTER PYLORI Colonoscopy 02/15/2017-6 mm polyp in the transverse colon (TUBULAR ADENOMA. NEGATIVE FOR HIGH GRADE DYSPLASIA OR MALIGNANCY) Diabetes Hypertension Mild  neutrophilia- chronic, white count dating at least to 2012 on review of epic chart Small hemoglobin on repeat urinalyses-seen by urology.  Disposition: Beth Holden appears stable.  We reviewed the CBC from today.  Hemoglobin is stable to improved as compared to 3 months ago, but lower than a year ago.  Ferritin from 3 months ago was in low normal range.  We will follow-up on the ferritin from today.  She will resume oral iron (ferrous sulfate 325 mg twice daily).  We will see her back for lab and follow-up in 2 months.    Ned Card ANP/GNP-BC   02/04/2021  8:18 AM

## 2021-02-05 ENCOUNTER — Ambulatory Visit: Payer: Medicare Other | Admitting: Family

## 2021-02-12 ENCOUNTER — Ambulatory Visit: Payer: Medicare Other | Admitting: Internal Medicine

## 2021-02-17 ENCOUNTER — Encounter: Payer: Self-pay | Admitting: Internal Medicine

## 2021-02-17 DIAGNOSIS — H25813 Combined forms of age-related cataract, bilateral: Secondary | ICD-10-CM | POA: Diagnosis not present

## 2021-02-17 DIAGNOSIS — E119 Type 2 diabetes mellitus without complications: Secondary | ICD-10-CM | POA: Diagnosis not present

## 2021-02-17 DIAGNOSIS — H43811 Vitreous degeneration, right eye: Secondary | ICD-10-CM | POA: Diagnosis not present

## 2021-02-17 LAB — HM DIABETES EYE EXAM

## 2021-02-18 ENCOUNTER — Encounter: Payer: Self-pay | Admitting: Orthopedic Surgery

## 2021-02-18 ENCOUNTER — Other Ambulatory Visit: Payer: Self-pay

## 2021-02-18 ENCOUNTER — Ambulatory Visit (INDEPENDENT_AMBULATORY_CARE_PROVIDER_SITE_OTHER): Payer: Medicare Other | Admitting: Orthopedic Surgery

## 2021-02-18 DIAGNOSIS — L97511 Non-pressure chronic ulcer of other part of right foot limited to breakdown of skin: Secondary | ICD-10-CM

## 2021-02-18 DIAGNOSIS — B351 Tinea unguium: Secondary | ICD-10-CM

## 2021-02-18 DIAGNOSIS — Z89432 Acquired absence of left foot: Secondary | ICD-10-CM

## 2021-02-18 NOTE — Progress Notes (Signed)
Office Visit Note   [name]           Date of Birth: 1945/03/20           MRN: [id number] Visit Date: 02/18/2021              Requested by: Vivi Barrack, [place],  [place] [address] PCP: Vivi Barrack, MD  Chief Complaint  Patient presents with   Right Foot - Wound Check      HPI: Patient is a 76 year old woman who presents in follow-up for [facility] grade 1 ulcer plantar aspect of the right foot as well as a transmetatarsal amputation on the left.  Assessment & Plan: Visit Diagnoses:  1. Non-pressure chronic ulcer of other part of right foot limited to breakdown of skin ([place])   2. 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[place])   3. Onychomycosis     Plan: Ulcer was debrided this continues to improve.  Continue with protective shoe wear and custom orthotics  Follow-Up Instructions: Return in about 4 weeks (around 03/18/2021).   Ortho Exam  Patient is alert, oriented, no adenopathy, well-dressed, normal affect, normal respiratory effort. Examination patient has a stable left transmetatarsal amputation without ulcerations redness or swelling no Charcot arthropathy.  Examination the right foot she has a Wagner grade 1 ulcer beneath the third and fourth metatarsal heads.  After informed consent a 10 blade knife was used to debride the skin and soft tissue back to healthy viable granulation tissue before debridement the ulcer is 5 mm in diameter after debridement the ulcer is 15 mm in diameter 1 mm deep there is healthy granulation tissue no exposed bone or tendon no signs of infection.  Imaging: No results found. No images are attached to the encounter.  Labs: Lab Results  Component Value Date   HGBA1C 8.1 (H) 08/12/2020   HGBA1C 8.4 (H) 05/29/2020   HGBA1C 8.0 (A) 03/18/2020   ESRSEDRATE 51 (H) 12/24/2019   ESRSEDRATE 36 (H) 02/24/2017   CRP 13.2 (H) 12/24/2019   CRP 1.2 (H) 02/24/2017   LABURIC 5.5 09/01/2015   REPTSTATUS  06/03/2020 FINAL 05/29/2020   CULT  05/29/2020    NO GROWTH 5 DAYS Performed at [facility], [place] [address] [place], [place] [address]    LABORGA GROUP B STREP (S.AGALACTIAE) ISOLATED 12/26/2014     Lab Results  Component Value Date   ALBUMIN 3.8 09/08/2020   ALBUMIN 3.4 (L) 05/29/2020   ALBUMIN 3.6 12/26/2019   PREALBUMIN 19.4 03/05/2012    No results found for: MG No results found for: VD25OH  Lab Results  Component Value Date   PREALBUMIN 19.4 03/05/2012   CBC EXTENDED Latest Ref Rng & Units 02/04/2021 11/05/2020 09/08/2020  WBC 4.0 - 10.5 K/uL 12.8(H) 11.4(H) 11.4(H)  RBC 3.87 - 5.11 MIL/uL 4.21 4.11 3.91  HGB 12.0 - 15.0 g/dL 10.9(L) 10.5(L) 10.3(L)  HCT 36.0 - 46.0 % 34.7(L) 33.8(L) 31.3(L)  PLT 150 - 400 K/uL 410(H) 409(H) 444.0(H)  NEUTROABS 1.7 - 7.7 K/uL 9.1(H) 7.3 7.4  LYMPHSABS 0.7 - 4.0 K/uL 2.7 3.0 3.0     There is no height or weight on file to calculate BMI.  Orders:  No orders of the defined types were placed in this encounter.  No orders of the defined types were placed in this encounter.    Procedures: No procedures performed  Clinical Data: No additional findings.  ROS:  All other systems negative, except  as noted in the HPI. Review of Systems  Objective: Vital Signs: There were no vitals taken for this visit.  Specialty Comments:  No specialty comments available.  PMFS History: Patient Active Problem List   Diagnosis Date Noted   Coccyx pain 12/18/2020   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[place]) 10/28/2020   Memory loss 11/21/2019   Mild nonproliferative diabetic retinopathy of both eyes ([place]) 10/23/2019   Nuclear sclerotic cataract of both eyes 10/23/2019   Posterior vitreous detachment of right eye 10/23/2019   Retinal hemorrhage of left eye 10/23/2019   Impingement syndrome of right shoulder 07/06/2017   Iron deficiency anemia 05/11/2017   Morbid obesity ([place]) 04/24/2017   Anemia 02/24/2017    Baker's cyst 07/10/2016   Onychomycosis 12/07/2015   Upper airway cough syndrome 03/05/2014   Hypertension associated with diabetes ([place]) 12/05/2006   T2DM (type 2 diabetes mellitus) ([place]) 10/12/2006   Dyslipidemia associated with type 2 diabetes mellitus ([place]) 10/12/2006   Allergic rhinitis 10/12/2006   GERD - Followed by Dr. Fuller Plan 10/12/2006   IRRITABLE BOWEL SYNDROME - followed by Dr. Fuller Plan 10/12/2006   Peripheral neuropathy 10/11/2006   ALOPECIA NEC 10/11/2006   Past Medical History:  Diagnosis Date   Alopecia    Anemia, mild    Arthritis    Chronic cough    sees pulmonologist   Chronic pain    chest wall and abd - s/p extensive eval   Diabetes mellitus with neuropathy (HCC)    sees endocrine   Diverticulosis    Dyspnea    Fatty liver    GERD (gastroesophageal reflux disease)    takes Nexium bid, hx erosive esophagitis   Headache(784.0)    occasionally;r/t sinus    History of colon polyps    HTN (hypertension)    Hx of amputation of lesser toe (HCC)    sees podiatrist   Hyperlipemia    IBS (irritable bowel syndrome)    Insomnia    takes Elavil nightly   Joint pain    Neuropathy    Neuropathy    Osteomyelitis ([place])    Pneumonia    89/2/22- patient denies   PONV (postoperative nausea and vomiting)    medication did not help with surgery on 03/28/20   Seasonal allergies    takes Zyrtec daily   Sinus tachycardia     Family History  Problem Relation Age of Onset   Lung cancer Mother 78       smoked heavily   Emphysema Mother    Hypertension Father    Hyperlipidemia Father    Diabetes Father    Coronary artery disease Father    Dementia Father    COPD Father        smoked   Stomach cancer Paternal Aunt    Brain cancer Paternal Uncle    Irritable bowel syndrome Other        Several family members on fathers side    Diabetes Other    Stomach cancer Maternal Aunt    Lung cancer Paternal Uncle    Heart disease Paternal Uncle    Colon cancer Neg Hx      Past Surgical History:  Procedure Laterality Date   AMPUTATION  12/28/2011   Procedure: AMPUTATION DIGIT;  Surgeon: Newt Minion, MD;  Location: [facility];  Service: Orthopedics;  Laterality: Right;  Right Foot 2nd Toe Amputation at MTP (metatarsophalangeal joint)   AMPUTATION Right 04/25/2012   Procedure: Right Foot 3rd Toe Amputation;  Surgeon:  Newt Minion, MD;  Location: [facility];  Service: Orthopedics;  Laterality: Right;  Right Foot Third Toe Amputation    AMPUTATION Right 07/27/2012   Procedure: Right 4th Toe Amputation at Metatarsophalangeal;  Surgeon: Newt Minion, MD;  Location: [facility];  Service: Orthopedics;  Laterality: Right;  Right 4th Toe Amputation at Metatarsophalangeal   AMPUTATION Left 07/15/2016   Procedure: Left 2nd Ray Amputation;  Surgeon: Newt Minion, MD;  Location: [facility];  Service: Orthopedics;  Laterality: Left;   AMPUTATION Left 11/18/2016   Procedure: Left 3rd and 4th Ray Amputation;  Surgeon: Newt Minion, MD;  Location: [facility];  Service: Orthopedics;  Laterality: Left;   AMPUTATION Right 03/29/2017   Procedure: RIGHT FOOT 3RD AND 4TH RAY AMPUTATION;  Surgeon: Newt Minion, MD;  Location: [facility];  Service: Orthopedics;  Laterality: Right;   AMPUTATION Left 08/12/2020   Procedure: LEFT TRANSMETATARSAL AMPUTATION;  Surgeon: Newt Minion, MD;  Location: [facility];  Service: Orthopedics;  Laterality: Left;   APPLICATION OF WOUND VAC Left 08/12/2020   Procedure: APPLICATION OF WOUND VAC;  Surgeon: Newt Minion, MD;  Location: [facility];  Service: Orthopedics;  Laterality: Left;   COLONOSCOPY     LAPAROSCOPIC APPENDECTOMY  01/05/2011   Procedure: APPENDECTOMY LAPAROSCOPIC;  Surgeon: Pedro Earls, MD;  [facility];  Service: General;  Laterality: N/A;   OOPHORECTOMY  2001   ROTATOR CUFF REPAIR Right    x 2   TUBAL LIGATION     VAGINAL HYSTERECTOMY  2001   Social History   Occupational History   Occupation: Retired   Tobacco Use   Smoking status: Never   Smokeless  tobacco: Never  Vaping Use   Vaping Use: Never used  Substance and Sexual Activity   Alcohol use: Never   Drug use: Never   Sexual activity: Yes    Birth control/protection: Surgical

## 2021-03-09 ENCOUNTER — Encounter: Payer: Self-pay | Admitting: Orthopedic Surgery

## 2021-03-10 ENCOUNTER — Ambulatory Visit (INDEPENDENT_AMBULATORY_CARE_PROVIDER_SITE_OTHER): Payer: Medicare Other | Admitting: Family

## 2021-03-10 DIAGNOSIS — M5416 Radiculopathy, lumbar region: Secondary | ICD-10-CM

## 2021-03-10 DIAGNOSIS — Z89432 Acquired absence of left foot: Secondary | ICD-10-CM | POA: Diagnosis not present

## 2021-03-10 DIAGNOSIS — L97511 Non-pressure chronic ulcer of other part of right foot limited to breakdown of skin: Secondary | ICD-10-CM | POA: Diagnosis not present

## 2021-03-10 DIAGNOSIS — B351 Tinea unguium: Secondary | ICD-10-CM | POA: Diagnosis not present

## 2021-03-10 MED ORDER — METHYLPREDNISOLONE 4 MG PO TBPK: ORAL_TABLET | ORAL | 0 refills | Status: DC

## 2021-03-10 NOTE — Progress Notes (Signed)
Office Visit Note   [name]           Date of Birth: 24-Dec-1945           MRN: [id number] Visit Date: 03/10/2021              Requested by: Vivi Barrack, MD [address] [place],  [place] [address] PCP: Vivi Barrack, MD  Chief Complaint  Patient presents with   Left Foot - Pain    Hx left transmet amputation      HPI: Patient is a 76 year old woman who presents in follow-up for [facility] grade 1 ulcer plantar aspect of the right foot as well as a transmetatarsal amputation on the left.  She presents today in follow-up as well as with a new concern of sharp shooting pain in her anterior shin.  She states the pain radiates from her left transmetatarsal amputation incision up to her knee.  She said this feels similar to her neuropathic pain is shooting pain denies any numbness or tingling no weakness  Assessment & Plan: Visit Diagnoses:  No diagnosis found.   Plan: We will trial her on a short course of prednisone for her left lower extremity radicular symptoms.  Callus over her transmetatarsal amputation debrided.  This is well-healed.  Have also debrided the callused ulceration beneath the right foot.  This continues to improve.  Continue with protective shoe wear and custom orthotics  Follow-Up Instructions: Return in about 4 weeks (around 04/07/2021), or if symptoms worsen or fail to improve.   Back Exam   Tenderness  The patient is experiencing no tenderness.   Tests  Straight leg raise right: negative Straight leg raise left: positive     Patient is alert, oriented, no adenopathy, well-dressed, normal affect, normal respiratory effort. Examination patient has a stable left transmetatarsal amputation without ulcerations redness or swelling no Charcot arthropathy.  Examination the right foot she has a Wagner grade 1 ulcer beneath the third and fourth metatarsal heads.  After informed consent a 10 blade knife was used to debride the skin and soft  tissue back to healthy viable granulation tissue. after debridement the ulcer is 3 mm in diameter 1 mm deep there is healthy granulation tissue no exposed bone or tendon no signs of infection.  Imaging: No results found. No images are attached to the encounter.  Labs: Lab Results  Component Value Date   HGBA1C 8.1 (H) 08/12/2020   HGBA1C 8.4 (H) 05/29/2020   HGBA1C 8.0 (A) 03/18/2020   ESRSEDRATE 51 (H) 12/24/2019   ESRSEDRATE 36 (H) 02/24/2017   CRP 13.2 (H) 12/24/2019   CRP 1.2 (H) 02/24/2017   LABURIC 5.5 09/01/2015   REPTSTATUS 06/03/2020 FINAL 05/29/2020   CULT  05/29/2020    NO GROWTH 5 DAYS Performed at [facility], [place] [address], [place] [address]    LABORGA GROUP B STREP (S.AGALACTIAE) ISOLATED 12/26/2014     Lab Results  Component Value Date   ALBUMIN 3.8 09/08/2020   ALBUMIN 3.4 (L) 05/29/2020   ALBUMIN 3.6 12/26/2019   PREALBUMIN 19.4 03/05/2012    No results found for: MG No results found for: VD25OH  Lab Results  Component Value Date   PREALBUMIN 19.4 03/05/2012   CBC EXTENDED Latest Ref Rng & Units 02/04/2021 11/05/2020 09/08/2020  WBC 4.0 - 10.5 K/uL 12.8(H) 11.4(H) 11.4(H)  RBC 3.87 - 5.11 MIL/uL 4.21 4.11 3.91  HGB 12.0 - 15.0 g/dL 10.9(L) 10.5(L) 10.3(L)  HCT  36.0 - 46.0 % 34.7(L) 33.8(L) 31.3(L)  PLT 150 - 400 K/uL 410(H) 409(H) 444.0(H)  NEUTROABS 1.7 - 7.7 K/uL 9.1(H) 7.3 7.4  LYMPHSABS 0.7 - 4.0 K/uL 2.7 3.0 3.0     There is no height or weight on file to calculate BMI.  Orders:  No orders of the defined types were placed in this encounter.  No orders of the defined types were placed in this encounter.    Procedures: No procedures performed  Clinical Data: No additional findings.  ROS:  All other systems negative, except as noted in the HPI. Review of Systems  Objective: Vital Signs: There were no vitals taken for this visit.  Specialty Comments:  No specialty comments available.  PMFS History: Patient  Active Problem List   Diagnosis Date Noted   Coccyx pain 12/18/2020   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[place]) 10/28/2020   Memory loss 11/21/2019   Mild nonproliferative diabetic retinopathy of both eyes ([place]) 10/23/2019   Nuclear sclerotic cataract of both eyes 10/23/2019   Posterior vitreous detachment of right eye 10/23/2019   Retinal hemorrhage of left eye 10/23/2019   Impingement syndrome of right shoulder 07/06/2017   Iron deficiency anemia 05/11/2017   Morbid obesity ([place]) 04/24/2017   Anemia 02/24/2017   Baker's cyst 07/10/2016   Onychomycosis 12/07/2015   Upper airway cough syndrome 03/05/2014   Hypertension associated with diabetes ([place]) 12/05/2006   T2DM (type 2 diabetes mellitus) ([place]) 10/12/2006   Dyslipidemia associated with type 2 diabetes mellitus ([place]) 10/12/2006   Allergic rhinitis 10/12/2006   GERD - Followed by Dr. Fuller Plan 10/12/2006   IRRITABLE BOWEL SYNDROME - followed by Dr. Fuller Plan 10/12/2006   Peripheral neuropathy 10/11/2006   ALOPECIA NEC 10/11/2006   Past Medical History:  Diagnosis Date   Alopecia    Anemia, mild    Arthritis    Chronic cough    sees pulmonologist   Chronic pain    chest wall and abd - s/p extensive eval   Diabetes mellitus with neuropathy (HCC)    sees endocrine   Diverticulosis    Dyspnea    Fatty liver    GERD (gastroesophageal reflux disease)    takes Nexium bid, hx erosive esophagitis   Headache(784.0)    occasionally;r/t sinus    History of colon polyps    HTN (hypertension)    Hx of amputation of lesser toe (HCC)    sees podiatrist   Hyperlipemia    IBS (irritable bowel syndrome)    Insomnia    takes Elavil nightly   Joint pain    Neuropathy    Neuropathy    Osteomyelitis ([place])    Pneumonia    89/2/22- patient denies   PONV (postoperative nausea and vomiting)    medication did not help with surgery on 03/28/20   Seasonal allergies    takes Zyrtec daily   Sinus tachycardia     Family History   Problem Relation Age of Onset   Lung cancer Mother 80       smoked heavily   Emphysema Mother    Hypertension Father    Hyperlipidemia Father    Diabetes Father    Coronary artery disease Father    Dementia Father    COPD Father        smoked   Stomach cancer Paternal Aunt    Brain cancer Paternal Uncle    Irritable bowel syndrome Other        Several family members on fathers  side    Diabetes Other    Stomach cancer Maternal Aunt    Lung cancer Paternal Uncle    Heart disease Paternal Uncle    Colon cancer Neg Hx     Past Surgical History:  Procedure Laterality Date   AMPUTATION  12/28/2011   Procedure: AMPUTATION DIGIT;  Surgeon: Newt Minion, MD;  Location: [place];  Service: Orthopedics;  Laterality: Right;  Right Foot 2nd Toe Amputation at MTP (metatarsophalangeal joint)   AMPUTATION Right 04/25/2012   Procedure: Right Foot 3rd Toe Amputation;  Surgeon: Newt Minion, MD;  Location: [place];  Service: Orthopedics;  Laterality: Right;  Right Foot Third Toe Amputation    AMPUTATION Right 07/27/2012   Procedure: Right 4th Toe Amputation at Metatarsophalangeal;  Surgeon: Newt Minion, MD;  Location: [place] [facility];  Service: Orthopedics;  Laterality: Right;  Right 4th Toe Amputation at Metatarsophalangeal   AMPUTATION Left 07/15/2016   Procedure: Left 2nd Ray Amputation;  Surgeon: Newt Minion, MD;  Location: [facility];  Service: Orthopedics;  Laterality: Left;   AMPUTATION Left 11/18/2016   Procedure: Left 3rd and 4th Ray Amputation;  Surgeon: Newt Minion, MD;  Location: [place] [facility];  Service: Orthopedics;  Laterality: Left;   AMPUTATION Right 03/29/2017   Procedure: RIGHT FOOT 3RD AND 4TH RAY AMPUTATION;  Surgeon: Newt Minion, MD;  Location: [facility];  Service: Orthopedics;  Laterality: Right;   AMPUTATION Left 08/12/2020   Procedure: LEFT TRANSMETATARSAL AMPUTATION;  Surgeon: Newt Minion, MD;  Location: [facility];  Service: Orthopedics;  Laterality: Left;   APPLICATION OF WOUND VAC Left 08/12/2020    Procedure: APPLICATION OF WOUND VAC;  Surgeon: Newt Minion, MD;  Location: [place];  Service: Orthopedics;  Laterality: Left;   COLONOSCOPY     LAPAROSCOPIC APPENDECTOMY  01/05/2011   Procedure: APPENDECTOMY LAPAROSCOPIC;  Surgeon: Pedro Earls, MD;  Location: [facility];  Service: General;  Laterality: N/A;   OOPHORECTOMY  2001   ROTATOR CUFF REPAIR Right    x 2   TUBAL LIGATION     VAGINAL HYSTERECTOMY  2001   Social History   Occupational History   Occupation: Retired   Tobacco Use   Smoking status: Never   Smokeless tobacco: Never  Vaping Use   Vaping Use: Never used  Substance and Sexual Activity   Alcohol use: Never   Drug use: Never   Sexual activity: Yes    Birth control/protection: Surgical

## 2021-03-18 ENCOUNTER — Ambulatory Visit: Payer: Medicare Other | Admitting: Orthopedic Surgery

## 2021-03-22 ENCOUNTER — Encounter: Payer: Self-pay | Admitting: Orthopedic Surgery

## 2021-03-22 ENCOUNTER — Ambulatory Visit (INDEPENDENT_AMBULATORY_CARE_PROVIDER_SITE_OTHER): Payer: Medicare Other | Admitting: Orthopedic Surgery

## 2021-03-22 ENCOUNTER — Encounter: Payer: Self-pay | Admitting: Nurse Practitioner

## 2021-03-22 DIAGNOSIS — L97511 Non-pressure chronic ulcer of other part of right foot limited to breakdown of skin: Secondary | ICD-10-CM

## 2021-03-22 DIAGNOSIS — Z89432 Acquired absence of left foot: Secondary | ICD-10-CM

## 2021-03-22 DIAGNOSIS — L02611 Cutaneous abscess of right foot: Secondary | ICD-10-CM

## 2021-03-22 MED ORDER — SULFAMETHOXAZOLE-TRIMETHOPRIM 800-160 MG PO TABS: 1.0000 | ORAL_TABLET | Freq: Two times a day (BID) | ORAL | 0 refills | Status: DC

## 2021-03-22 NOTE — Progress Notes (Addendum)
Office Visit Note   [name]           Date of Birth: [birth year]           MRN: [id number] Visit Date: 03/22/2021              Requested by: Ardith Dark, MD [address] [place],  [place] [address] PCP: Ardith Dark, MD  Chief Complaint  Patient presents with   Right Foot - Wound Check   Left Foot - Wound Check      HPI: Patient is a 76 year old woman who presents with drainage pain and ulceration plantar aspect right foot.  Patient is status post transmetatarsal amputation on the left.  Assessment & Plan: Visit Diagnoses:  1. Non-pressure chronic ulcer of other part of right foot limited to breakdown of skin (HCC)   2. 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HCC)   3. Cutaneous abscess of right foot     Plan: Ulcer was debrided abscess decompressed.  Patient was told to stop her prednisone and a prescription for Bactrim DS was sent in.  Follow-Up Instructions: Return in about 2 weeks (around 04/05/2021).   Ortho Exam  Patient is alert, oriented, no adenopathy, well-dressed, normal affect, normal respiratory effort. Examination of the left foot patient has no ulcers no cellulitis no signs of infection.  Examination of the right foot there is a 2 x 3 mm ulcer on the plantar aspect of the forefoot.  After informed consent a 10 blade knife was used to debride the skin and soft tissue back to healthy bleeding viable granulation tissue.  This did not extend to bone or tendon.  After debridement the ulcer is 3 x 5 cm.  The abscess was decompressed.  Silver nitrate was used hemostasis a dry dressing was applied.  Imaging: No results found. No images are attached to the encounter.  Labs: Lab Results  Component Value Date   HGBA1C 8.1 (H) 08/12/2020   HGBA1C 8.4 (H) 05/29/2020   HGBA1C 8.0 (A) 03/18/2020   ESRSEDRATE 51 (H) 12/24/2019   ESRSEDRATE 36 (H) 02/24/2017   CRP 13.2 (H) 12/24/2019   CRP 1.2 (H) 02/24/2017   LABURIC 5.5 09/01/2015    REPTSTATUS 06/03/2020 FINAL 05/29/2020   CULT  05/29/2020    NO GROWTH 5 DAYS Performed at [facility], [address], [place], [place] [address]    LABORGA GROUP B STREP (S.AGALACTIAE) ISOLATED 12/26/2014     Lab Results  Component Value Date   ALBUMIN 3.8 09/08/2020   ALBUMIN 3.4 (L) 05/29/2020   ALBUMIN 3.6 12/26/2019   PREALBUMIN 19.4 03/05/2012    No results found for: MG No results found for: VD25OH  Lab Results  Component Value Date   PREALBUMIN 19.4 03/05/2012   CBC EXTENDED Latest Ref Rng & Units 02/04/2021 11/05/2020 09/08/2020  WBC 4.0 - 10.5 K/uL 12.8(H) 11.4(H) 11.4(H)  RBC 3.87 - 5.11 MIL/uL 4.21 4.11 3.91  HGB 12.0 - 15.0 g/dL 10.9(L) 10.5(L) 10.3(L)  HCT 36.0 - 46.0 % 34.7(L) 33.8(L) 31.3(L)  PLT 150 - 400 K/uL 410(H) 409(H) 444.0(H)  NEUTROABS 1.7 - 7.7 K/uL 9.1(H) 7.3 7.4  LYMPHSABS 0.7 - 4.0 K/uL 2.7 3.0 3.0     There is no height or weight on file to calculate BMI.  Orders:  No orders of the defined types were placed in this encounter.  Meds ordered this encounter  Medications   sulfamethoxazole-trimethoprim (BACTRIM DS) 800-160 MG tablet  Sig: Take 1 tablet by mouth 2 (two) times daily.    Dispense:  20 tablet    Refill:  0     Procedures: No procedures performed  Clinical Data: No additional findings.  ROS:  All other systems negative, except as noted in the HPI. Review of Systems  Objective: Vital Signs: There were no vitals taken for this visit.  Specialty Comments:  No specialty comments available.  PMFS History: Patient Active Problem List   Diagnosis Date Noted   Coccyx pain 12/18/2020   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HCC) 10/28/2020   Memory loss 11/21/2019   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) 10/23/2019   Nuclear sclerotic cataract of both eyes 10/23/2019   Posterior vitreous detachment of right eye 10/23/2019   Retinal hemorrhage of left eye 10/23/2019    Impingement syndrome of right shoulder 07/06/2017   Iron deficiency anemia 05/11/2017   Morbid obesity (HCC) 04/24/2017   Anemia 02/24/2017   Baker's cyst 07/10/2016   Onychomycosis 12/07/2015   Upper airway cough syndrome 03/05/2014   Hypertension associated with diabetes (HCC) 12/05/2006   T2DM (type 2 diabetes mellitus) (HCC) 10/12/2006   Dyslipidemia associated with type 2 diabetes mellitus (HCC) 10/12/2006   Allergic rhinitis 10/12/2006   GERD - Followed by Dr. Russella Dar 10/12/2006   IRRITABLE BOWEL SYNDROME - followed by Dr. Russella Dar 10/12/2006   Peripheral neuropathy 10/11/2006   ALOPECIA NEC 10/11/2006   Past Medical History:  Diagnosis Date   Alopecia    Anemia, mild    Arthritis    Chronic cough    sees pulmonologist   Chronic pain    chest wall and abd - s/p extensive eval   Diabetes mellitus with neuropathy (HCC)    sees endocrine   Diverticulosis    Dyspnea    Fatty liver    GERD (gastroesophageal reflux disease)    takes Nexium bid, hx erosive esophagitis   Headache(784.0)    occasionally;r/t sinus    History of colon polyps    HTN (hypertension)    Hx of amputation of lesser toe (HCC)    sees podiatrist   Hyperlipemia    IBS (irritable bowel syndrome)    Insomnia    takes Elavil nightly   Joint pain    Neuropathy    Neuropathy    Osteomyelitis (HCC)    Pneumonia    89/2/22- patient denies   PONV (postoperative nausea and vomiting)    medication did not help with surgery on 03/28/20   Seasonal allergies    takes Zyrtec daily   Sinus tachycardia     Family History  Problem Relation Age of Onset   Lung cancer Mother 9       smoked heavily   Emphysema Mother    Hypertension Father    Hyperlipidemia Father    Diabetes Father    Coronary artery disease Father    Dementia Father    COPD Father        smoked   Stomach cancer Paternal Aunt    Brain cancer Paternal Uncle    Irritable bowel syndrome Other        Several family members on fathers side     Diabetes Other    Stomach cancer Maternal Aunt    Lung cancer Paternal Uncle    Heart disease Paternal Uncle    Colon cancer Neg Hx     Past Surgical History:  Procedure Laterality Date   AMPUTATION  12/28/2011  Procedure: AMPUTATION DIGIT;  Surgeon: Nadara Mustard, MD;  Location: [facility];  Service: Orthopedics;  Laterality: Right;  Right Foot 2nd Toe Amputation at MTP (metatarsophalangeal joint)   AMPUTATION Right 04/25/2012   Procedure: Right Foot 3rd Toe Amputation;  Surgeon: Nadara Mustard, MD;  Location: [facility];  Service: Orthopedics;  Laterality: Right;  Right Foot Third Toe Amputation    AMPUTATION Right 07/27/2012   Procedure: Right 4th Toe Amputation at Metatarsophalangeal;  Surgeon: Nadara Mustard, MD;  Location: [facility] OR;  Service: Orthopedics;  Laterality: Right;  Right 4th Toe Amputation at Metatarsophalangeal   AMPUTATION Left 07/15/2016   Procedure: Left 2nd Ray Amputation;  Surgeon: Nadara Mustard, MD;  Location: [facility] OR;  Service: Orthopedics;  Laterality: Left;   AMPUTATION Left 11/18/2016   Procedure: Left 3rd and 4th Ray Amputation;  Surgeon: Nadara Mustard, MD;  Location: [facility] OR;  Service: Orthopedics;  Laterality: Left;   AMPUTATION Right 03/29/2017   Procedure: RIGHT FOOT 3RD AND 4TH RAY AMPUTATION;  Surgeon: Nadara Mustard, MD;  Location[facility];  Service: Orthopedics;  Laterality: Right;   AMPUTATION Left 08/12/2020   Procedure: LEFT TRANSMETATARSAL AMPUTATION;  Surgeon: Nadara Mustard, MD;  Location: [facility] OR;  Service: Orthopedics;  Laterality: Left;   APPLICATION OF WOUND VAC Left 08/12/2020   Procedure: APPLICATION OF WOUND VAC;  Surgeon: Nadara Mustard, MD;  Location: [facility];  Service: Orthopedics;  Laterality: Left;   COLONOSCOPY     LAPAROSCOPIC APPENDECTOMY  01/05/2011   Procedure: APPENDECTOMY LAPAROSCOPIC;  Surgeon: Valarie Merino, MD;  Location[facility];  Service: General;  Laterality: N/A;   OOPHORECTOMY  2001   ROTATOR CUFF REPAIR Right    x 2   TUBAL LIGATION      VAGINAL HYSTERECTOMY  2001   Social History   Occupational History   Occupation: Retired   Tobacco Use   Smoking status: Never   Smokeless tobacco: Never  Vaping Use   Vaping Use: Never used  Substance and Sexual Activity   Alcohol use: Never   Drug use: Never   Sexual activity: Yes    Birth control/protection: Surgical

## 2021-03-23 DIAGNOSIS — Z20822 Contact with and (suspected) exposure to covid-19: Secondary | ICD-10-CM | POA: Diagnosis not present

## 2021-03-30 ENCOUNTER — Encounter: Payer: Self-pay | Admitting: Internal Medicine

## 2021-03-31 MED ORDER — METOPROLOL SUCCINATE ER 25 MG PO TB24: 25.0000 mg | ORAL_TABLET | Freq: Every day | ORAL | 0 refills | Status: DC

## 2021-04-01 ENCOUNTER — Inpatient Hospital Stay: Payer: Medicare Other | Attending: Oncology | Admitting: Nurse Practitioner

## 2021-04-01 ENCOUNTER — Encounter: Payer: Self-pay | Admitting: Nurse Practitioner

## 2021-04-01 ENCOUNTER — Other Ambulatory Visit: Payer: Self-pay

## 2021-04-01 ENCOUNTER — Inpatient Hospital Stay: Payer: Medicare Other

## 2021-04-01 VITALS — BP 110/50 | HR 73 | Temp 97.8°F | Resp 18 | Ht 66.0 in | Wt 226.8 lb

## 2021-04-01 DIAGNOSIS — D509 Iron deficiency anemia, unspecified: Secondary | ICD-10-CM

## 2021-04-01 DIAGNOSIS — E119 Type 2 diabetes mellitus without complications: Secondary | ICD-10-CM | POA: Diagnosis not present

## 2021-04-01 DIAGNOSIS — I10 Essential (primary) hypertension: Secondary | ICD-10-CM | POA: Diagnosis not present

## 2021-04-01 LAB — CBC WITH DIFFERENTIAL (CANCER CENTER ONLY)
Abs Immature Granulocytes: 0.04 10*3/uL (ref 0.00–0.07)
Basophils Absolute: 0.1 10*3/uL (ref 0.0–0.1)
Basophils Relative: 1 %
Eosinophils Absolute: 0.3 10*3/uL (ref 0.0–0.5)
Eosinophils Relative: 2 %
HCT: 35.7 % — ABNORMAL LOW (ref 36.0–46.0)
Hemoglobin: 11.1 g/dL — ABNORMAL LOW (ref 12.0–15.0)
Immature Granulocytes: 0 %
Lymphocytes Relative: 30 %
Lymphs Abs: 3.4 10*3/uL (ref 0.7–4.0)
MCH: 26.1 pg (ref 26.0–34.0)
MCHC: 31.1 g/dL (ref 30.0–36.0)
MCV: 84 fL (ref 80.0–100.0)
Monocytes Absolute: 0.6 10*3/uL (ref 0.1–1.0)
Monocytes Relative: 6 %
Neutro Abs: 7.1 10*3/uL (ref 1.7–7.7)
Neutrophils Relative %: 61 %
Platelet Count: 400 10*3/uL (ref 150–400)
RBC: 4.25 MIL/uL (ref 3.87–5.11)
RDW: 14.6 % (ref 11.5–15.5)
WBC Count: 11.5 10*3/uL — ABNORMAL HIGH (ref 4.0–10.5)
nRBC: 0 % (ref 0.0–0.2)

## 2021-04-01 LAB — IRON AND TIBC
Iron: 75 ug/dL (ref 28–170)
Saturation Ratios: 22 % (ref 10.4–31.8)
TIBC: 346 ug/dL (ref 250–450)
UIBC: 271 ug/dL

## 2021-04-01 LAB — FERRITIN: Ferritin: 37 ng/mL (ref 11–307)

## 2021-04-01 NOTE — Progress Notes (Signed)
?  [facility] ?OFFICE PROGRESS NOTE ? ? ?Diagnosis: Anemia ? ?INTERVAL HISTORY:  ? ?Beth Holden returns as scheduled.  She resumed ferrous sulfate 325 mg twice daily following last office visit approximately 2 months ago.  She tolerates oral iron well.  No constipation.  She is not aware of any bleeding.  No improvement in fatigue.  She denies shortness of breath.  She reports feeling much better after getting IV iron several years ago. ? ?She reports an active diabetic foot ulcer.  She is followed by Beth Holden.  She is completing a course of antibiotics. ? ?Objective: ? ?Vital signs in last 24 hours: ? ?Blood pressure (!) 110/50, pulse 73, temperature 97.8 ?F (36.6 ?C), temperature source Oral, resp. rate 18, height '5\' 6"'$  (1.676 m), weight 226 lb 12.8 oz (102.9 kg), SpO2 98 %. ?  ? ?Resp: Lungs clear bilaterally. ?Cardio: Regular rate and rhythm. ?GI: Abdomen soft and nontender.  No hepatosplenomegaly. ?Vascular: No leg edema. ? ?Lab Results: ? ?Lab Results  ?Component Value Date  ? WBC 11.5 (H) 04/01/2021  ? HGB 11.1 (L) 04/01/2021  ? HCT 35.7 (L) 04/01/2021  ? MCV 84.0 04/01/2021  ? PLT 400 04/01/2021  ? NEUTROABS 7.1 04/01/2021  ? ? ?Imaging: ? ?No results found. ? ?Medications: I have reviewed the patient's current medications. ? 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  Hemoglobin/MCV corrected into normal range. ?Upper endoscopy 02/15/2017-diffuse moderate inflammation characterized by congestion, erythema, friability and granularity in the gastric body, posterior wall of the stomach and gastric antrum.  Biopsy GASTRIC ANTRAL MUCOSA WITH NON-SPECIFIC REACTIVE GASTROPATHY AND FEW SUBEPITHELIAL CRYSTALLINE IRON DEPOSITS, CONSISTENT WITH IRON PILL GASTROPATHY. GASTRIC OXYNTIC MUCOSA WITH PARIETAL CELL HYPERPLASIA AS CAN BE SEEN IN  HYPERGASTRINEMIC STATES SUCH AS PPI THERAPY. WARTHIN-STARRY STAIN IS NEGATIVE FOR HELICOBACTER PYLORI ?Colonoscopy 02/15/2017-6 mm polyp in the transverse colon (TUBULAR ADENOMA. NEGATIVE FOR HIGH GRADE DYSPLASIA OR MALIGNANCY) ?Diabetes ?Hypertension ?Mild neutrophilia- chronic, white count dating at least to 2012 on review of epic chart ?Small hemoglobin on repeat urinalyses-seen by urology. ? ?Disposition: Beth Holden appears stable.  She resumed oral iron 2 months ago.  Hemoglobin is stable to minimally improved.  She has persistent fatigue.  She noted feeling better after receiving IV iron in the past.  She will return next week for IV iron weekly x2.  We discussed the potential for an allergic reaction.  She agrees to proceed. ? ?She will return for lab in 2 months, follow-up appointment in 4 months.  We are available to see her sooner if needed. ? ?Plan reviewed with Dr. Benay Spice. ? ? ? ?Beth Holden ANP/GNP-BC  ? ?04/01/2021  ?8:21 AM ? ? ? ? ? ? ? ?

## 2021-04-02 DIAGNOSIS — Z20822 Contact with and (suspected) exposure to covid-19: Secondary | ICD-10-CM | POA: Diagnosis not present

## 2021-04-06 ENCOUNTER — Encounter: Payer: Self-pay | Admitting: Orthopedic Surgery

## 2021-04-06 ENCOUNTER — Ambulatory Visit (INDEPENDENT_AMBULATORY_CARE_PROVIDER_SITE_OTHER): Payer: Medicare Other | Admitting: Orthopedic Surgery

## 2021-04-06 DIAGNOSIS — Z89432 Acquired absence of left foot: Secondary | ICD-10-CM

## 2021-04-06 DIAGNOSIS — L97511 Non-pressure chronic ulcer of other part of right foot limited to breakdown of skin: Secondary | ICD-10-CM | POA: Diagnosis not present

## 2021-04-06 NOTE — Progress Notes (Signed)
? ?Office Visit Note ?  ?[name]           ?Date of Birth: 1945/05/10           ?MRN: [id number] ?Visit Date: 04/06/2021 ?             ?Requested by: Vivi Barrack, MD ?[address] ?[place],  [place] [id number] ?PCP: Vivi Barrack, MD ? ?Chief Complaint  ?Patient presents with  ? Right Foot - Follow-up  ? Left Foot - Follow-up  ? ? ? ? ?HPI: ?Patient is a 76 year old woman who presents in follow-up for left transmetatarsal amputation as well as ulceration beneath the right Charcot collapse.  Patient has been on Bactrim DS which she just completed she has minimized her weightbearing she is currently ambulating in diabetic shoes. ? ?Assessment & Plan: ?Visit Diagnoses:  ?1. Non-pressure chronic ulcer of other part of right foot limited to breakdown of skin ([place])   ?2. 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[place])   ? ? ?Plan: Continue with protected weightbearing with increasing her activities as tolerated. ? ?Follow-Up Instructions: Return in about 2 weeks (around 04/20/2021).  ? ?Ortho Exam ? ?Patient is alert, oriented, no adenopathy, well-dressed, normal affect, normal respiratory effort. ?Examination the left foot has no ulcers or cellulitis there is a little bit of callus over the residual limb on the left and this was pared with a 10 blade knife.  Examination of the right foot patient has significant decrease in the ulcer size and depth.  The cellulitis is completely resolved there is dependent redness but with elevation there is no redness.  There is no tenderness to palpation no drainage.  After informed consent a 10 blade knife was used to debride the skin and soft tissue back to healthy viable tissue.  After debridement the ulcer is 10 mm in diameter point 1 mm deep with healthy granulation tissue the ulcer does not probe to bone or tendon has no tunneling. ? ?Imaging: ?No results found. ?No images are attached to the encounter. ? ?Labs: ?Lab Results  ?Component Value Date  ?  HGBA1C 8.1 (H) 08/12/2020  ? HGBA1C 8.4 (H) 05/29/2020  ? HGBA1C 8.0 (A) 03/18/2020  ? ESRSEDRATE 51 (H) 12/24/2019  ? ESRSEDRATE 36 (H) 02/24/2017  ? CRP 13.2 (H) 12/24/2019  ? CRP 1.2 (H) 02/24/2017  ? LABURIC 5.5 09/01/2015  ? REPTSTATUS 06/03/2020 FINAL 05/29/2020  ? CULT  05/29/2020  ?  NO GROWTH 5 DAYS ?Performed at [facility], [place] [address] [place], [place] [address] ?  ? LABORGA GROUP B STREP ([place]) ISOLATED 12/26/2014  ? ? ? ?Lab Results  ?Component Value Date  ? ALBUMIN 3.8 09/08/2020  ? ALBUMIN 3.4 (L) 05/29/2020  ? ALBUMIN 3.6 12/26/2019  ? PREALBUMIN 19.4 03/05/2012  ? ? ?No results found for: MG ?No results found for: VD25OH ? ?Lab Results  ?Component Value Date  ? PREALBUMIN 19.4 03/05/2012  ? ? ?  Latest Ref Rng & Units 04/01/2021  ?  7:45 AM 02/04/2021  ?  7:51 AM 11/05/2020  ?  7:56 AM  ?CBC EXTENDED  ?WBC 4.0 - 10.5 K/uL 11.5   12.8   11.4    ?RBC 3.87 - 5.11 MIL/uL 4.25   4.21   4.11    ?Hemoglobin 12.0 - 15.0 g/dL 11.1   10.9   10.5    ?HCT 36.0 - 46.0 % 35.7   34.7   33.8    ?Platelets  150 - 400 K/uL 400   410   409    ?NEUT# 1.7 - 7.7 K/uL 7.1   9.1   7.3    ?Lymph# 0.7 - 4.0 K/uL 3.4   2.7   3.0    ? ? ? ?There is no height or weight on file to calculate BMI. ? ?Orders:  ?No orders of the defined types were placed in this encounter. ? ?No orders of the defined types were placed in this encounter. ? ? ? Procedures: ?No procedures performed ? ?Clinical Data: ?No additional findings. ? ?ROS: ? ?All other systems negative, except as noted in the HPI. ?Review of Systems ? ?Objective: ?Vital Signs: There were no vitals taken for this visit. ? ?Specialty Comments:  ?No specialty comments available. ? ?PMFS History: ?Patient Active Problem List  ? Diagnosis Date Noted  ? Coccyx pain 12/18/2020  ? 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[place]) 10/28/2020  ? Memory loss 11/21/2019  ? Mild nonproliferative diabetic retinopathy of both eyes ([place]) 10/23/2019  ? Nuclear sclerotic  cataract of both eyes 10/23/2019  ? Posterior vitreous detachment of right eye 10/23/2019  ? Retinal hemorrhage of left eye 10/23/2019  ? Impingement syndrome of right shoulder 07/06/2017  ? Iron deficiency anemia 05/11/2017  ? Morbid obesity ([place]) 04/24/2017  ? Anemia 02/24/2017  ? Baker's cyst 07/10/2016  ? Onychomycosis 12/07/2015  ? Upper airway cough syndrome 03/05/2014  ? Hypertension associated with diabetes ([place]) 12/05/2006  ? T2DM (type 2 diabetes mellitus) ([place]) 10/12/2006  ? Dyslipidemia associated with type 2 diabetes mellitus ([place]) 10/12/2006  ? Allergic rhinitis 10/12/2006  ? GERD - Followed by Dr. Fuller Plan 10/12/2006  ? IRRITABLE BOWEL SYNDROME - followed by Dr. Fuller Plan 10/12/2006  ? Peripheral neuropathy 10/11/2006  ? ALOPECIA NEC 10/11/2006  ? ?Past Medical History:  ?Diagnosis Date  ? Alopecia   ? Anemia, mild   ? Arthritis   ? Chronic cough   ? sees pulmonologist  ? Chronic pain   ? chest wall and abd - s/p extensive eval  ? Diabetes mellitus with neuropathy ([place])   ? sees endocrine  ? Diverticulosis   ? Dyspnea   ? Fatty liver   ? GERD (gastroesophageal reflux disease)   ? takes Nexium bid, hx erosive esophagitis  ? Headache(784.0)   ? occasionally;r/t sinus   ? History of colon polyps   ? HTN (hypertension)   ? Hx of amputation of lesser toe ([place])   ? sees podiatrist  ? Hyperlipemia   ? IBS (irritable bowel syndrome)   ? Insomnia   ? takes Elavil nightly  ? Joint pain   ? Neuropathy   ? Neuropathy   ? Osteomyelitis ([place])   ? Pneumonia   ? 89/2/22- patient denies  ? PONV (postoperative nausea and vomiting)   ? medication did not help with surgery on 03/28/20  ? Seasonal allergies   ? takes Zyrtec daily  ? Sinus tachycardia   ?  ?Family History  ?Problem Relation Age of Onset  ? Lung cancer Mother 23  ?     smoked heavily  ? Emphysema Mother   ? Hypertension Father   ? Hyperlipidemia Father   ? Diabetes Father   ? Coronary artery disease Father   ? Dementia Father   ? COPD Father   ?     smoked  ?  Stomach cancer Paternal Aunt   ? Brain cancer Paternal Uncle   ? Irritable bowel syndrome Other   ?  Several family members on fathers side   ? Diabetes Other   ? Stomach cancer Maternal Aunt   ? Lung cancer Paternal Uncle   ? Heart disease Paternal Uncle   ? Colon cancer Neg Hx   ?  ?Past Surgical History:  ?Procedure Laterality Date  ? AMPUTATION  12/28/2011  ? Procedure: AMPUTATION DIGIT;  Surgeon: Newt Minion, MD;  Location: [facility];  Service: Orthopedics;  Laterality: Right;  Right Foot 2nd Toe Amputation at MTP (metatarsophalangeal joint)  ? AMPUTATION Right 04/25/2012  ? Procedure: Right Foot 3rd Toe Amputation;  Surgeon: Newt Minion, MD;  Location: [facility];  Service: Orthopedics;  Laterality: Right;  Right Foot Third Toe Amputation   ? AMPUTATION Right 07/27/2012  ? Procedure: Right 4th Toe Amputation at Metatarsophalangeal;  Surgeon: Newt Minion, MD;  Location: [facility];  Service: Orthopedics;  Laterality: Right;  Right 4th Toe Amputation at Metatarsophalangeal  ? AMPUTATION Left 07/15/2016  ? Procedure: Left 2nd Ray Amputation;  Surgeon: Newt Minion, MD;  Location: [facility];  Service: Orthopedics;  Laterality: Left;  ? AMPUTATION Left 11/18/2016  ? Procedure: Left 3rd and 4th Ray Amputation;  Surgeon: Newt Minion, MD;  Location: [facility];  Service: Orthopedics;  Laterality: Left;  ? AMPUTATION Right 03/29/2017  ? Procedure: RIGHT FOOT 3RD AND 4TH RAY AMPUTATION;  Surgeon: Newt Minion, MD;  Location: [facility];  Service: Orthopedics;  Laterality: Right;  ? AMPUTATION Left 08/12/2020  ? Procedure: LEFT TRANSMETATARSAL AMPUTATION;  Surgeon: Newt Minion, MD;  Location: [facility];  Service: Orthopedics;  Laterality: Left;  ? APPLICATION OF WOUND VAC Left 08/12/2020  ? Procedure: APPLICATION OF WOUND VAC;  Surgeon: Newt Minion, MD;  Location: [facility];  Service: Orthopedics;  Laterality: Left;  ? COLONOSCOPY    ? LAPAROSCOPIC APPENDECTOMY  01/05/2011  ? Procedure: APPENDECTOMY LAPAROSCOPIC;  Surgeon: Pedro Earls, MD;   Location: [facility];  Service: General;  Laterality: N/A;  ? OOPHORECTOMY  2001  ? ROTATOR CUFF REPAIR Right   ? x 2  ? TUBAL LIGATION    ? VAGINAL HYSTERECTOMY  2001  ? ?Social History  ? ?Occupational History

## 2021-04-08 ENCOUNTER — Inpatient Hospital Stay: Payer: Medicare Other

## 2021-04-08 VITALS — BP 127/68 | HR 67 | Temp 97.3°F | Resp 18

## 2021-04-08 DIAGNOSIS — D649 Anemia, unspecified: Secondary | ICD-10-CM

## 2021-04-08 DIAGNOSIS — D509 Iron deficiency anemia, unspecified: Secondary | ICD-10-CM | POA: Diagnosis not present

## 2021-04-08 DIAGNOSIS — E119 Type 2 diabetes mellitus without complications: Secondary | ICD-10-CM | POA: Diagnosis not present

## 2021-04-08 DIAGNOSIS — I10 Essential (primary) hypertension: Secondary | ICD-10-CM | POA: Diagnosis not present

## 2021-04-08 MED ORDER — SODIUM CHLORIDE 0.9 % IV SOLN
510.0000 mg | Freq: Once | INTRAVENOUS | Status: AC
  Administered 2021-04-08: 510 mg via INTRAVENOUS
  Filled 2021-04-08: qty 17

## 2021-04-08 MED ORDER — SODIUM CHLORIDE 0.9 % IV SOLN: Freq: Once | INTRAVENOUS | Status: AC

## 2021-04-08 NOTE — Patient Instructions (Signed)

## 2021-04-15 ENCOUNTER — Encounter: Payer: Self-pay | Admitting: Orthopedic Surgery

## 2021-04-15 ENCOUNTER — Inpatient Hospital Stay: Payer: Medicare Other | Attending: Oncology

## 2021-04-15 ENCOUNTER — Ambulatory Visit (INDEPENDENT_AMBULATORY_CARE_PROVIDER_SITE_OTHER): Payer: Medicare Other | Admitting: Orthopedic Surgery

## 2021-04-15 VITALS — BP 130/51 | HR 66 | Temp 98.0°F | Resp 18

## 2021-04-15 DIAGNOSIS — L97511 Non-pressure chronic ulcer of other part of right foot limited to breakdown of skin: Secondary | ICD-10-CM | POA: Diagnosis not present

## 2021-04-15 DIAGNOSIS — D649 Anemia, unspecified: Secondary | ICD-10-CM

## 2021-04-15 DIAGNOSIS — D509 Iron deficiency anemia, unspecified: Secondary | ICD-10-CM | POA: Diagnosis not present

## 2021-04-15 MED ORDER — SODIUM CHLORIDE 0.9 % IV SOLN
510.0000 mg | Freq: Once | INTRAVENOUS | Status: AC
  Administered 2021-04-15: 510 mg via INTRAVENOUS
  Filled 2021-04-15: qty 17

## 2021-04-15 MED ORDER — SULFAMETHOXAZOLE-TRIMETHOPRIM 800-160 MG PO TABS: 1.0000 | ORAL_TABLET | Freq: Two times a day (BID) | ORAL | 0 refills | Status: DC

## 2021-04-15 MED ORDER — SODIUM CHLORIDE 0.9 % IV SOLN: Freq: Once | INTRAVENOUS | Status: AC

## 2021-04-15 NOTE — Patient Instructions (Signed)

## 2021-04-15 NOTE — Progress Notes (Signed)
? ?Office Visit Note ?  ?[name]           ?Date of Birth: 25-Nov-1945           ?MRN: [id number] ?Visit Date: 04/15/2021 ?             ?Requested by: Vivi Barrack, MD ?[address] ?[place],  [place] [address] ?PCP: Vivi Barrack, MD ? ?Chief Complaint  ?Patient presents with  ? Right Foot - Follow-up  ? ? ? ? ?HPI: ?Patient is a 76 year old woman who presents in follow-up for her right foot stating that she noticed some redness and pain with persistent ulceration of the plantar aspect of the right foot.  Patient is concerned that she may have developed a new infection. ? ?Assessment & Plan: ?Visit Diagnoses:  ?1. Non-pressure chronic ulcer of other part of right foot limited to breakdown of skin ([place])   ? ? ?Plan: Ulcer was debrided there is healthy granulation tissue with the redness we will call in a prescription for Bactrim DS and follow-up next week. ? ?Follow-Up Instructions: Return in about 1 week (around 04/22/2021).  ? ?Ortho Exam ? ?Patient is alert, oriented, no adenopathy, well-dressed, normal affect, normal respiratory effort. ?Examination the left foot has no ulcers or cellulitis.  There is some redness in the forefoot on the right but it is not tender to palpation.  The redness does resolve with massage but then does slowly recur.  Patient has a Wagner grade 1 ulcer beneath the forefoot.  After informed consent a 10 blade knife was used to debride the skin and soft tissue back to healthy viable granulation tissue.  The wound had no depth could not probe to bone or tendon.  After debridement the ulcer is 10 mm in diameter point 1 mm deep.  There is no drainage or odor. ? ?Imaging: ?No results found. ?No images are attached to the encounter. ? ?Labs: ?Lab Results  ?Component Value Date  ? HGBA1C 8.1 (H) 08/12/2020  ? HGBA1C 8.4 (H) 05/29/2020  ? HGBA1C 8.0 (A) 03/18/2020  ? ESRSEDRATE 51 (H) 12/24/2019  ? ESRSEDRATE 36 (H) 02/24/2017  ? CRP 13.2 (H) 12/24/2019  ? CRP 1.2 (H) 02/24/2017   ? LABURIC 5.5 09/01/2015  ? REPTSTATUS 06/03/2020 FINAL 05/29/2020  ? CULT  05/29/2020  ?  NO GROWTH 5 DAYS ?Performed at [facility], [place] [address], [place], [place] [address] ?  ? LABORGA GROUP B STREP ([employer]) ISOLATED 12/26/2014  ? ? ? ?Lab Results  ?Component Value Date  ? ALBUMIN 3.8 09/08/2020  ? ALBUMIN 3.4 (L) 05/29/2020  ? ALBUMIN 3.6 12/26/2019  ? PREALBUMIN 19.4 03/05/2012  ? ? ?No results found for: MG ?No results found for: VD25OH ? ?Lab Results  ?Component Value Date  ? PREALBUMIN 19.4 03/05/2012  ? ? ?  Latest Ref Rng & Units 04/01/2021  ?  7:45 AM 02/04/2021  ?  7:51 AM 11/05/2020  ?  7:56 AM  ?CBC EXTENDED  ?WBC 4.0 - 10.5 K/uL 11.5   12.8   11.4    ?RBC 3.87 - 5.11 MIL/uL 4.25   4.21   4.11    ?Hemoglobin 12.0 - 15.0 g/dL 11.1   10.9   10.5    ?HCT 36.0 - 46.0 % 35.7   34.7   33.8    ?Platelets 150 - 400 K/uL 400   410   409    ?NEUT# 1.7 - 7.7 K/uL 7.1   9.1  7.3    ?Lymph# 0.7 - 4.0 K/uL 3.4   2.7   3.0    ? ? ? ?There is no height or weight on file to calculate BMI. ? ?Orders:  ?No orders of the defined types were placed in this encounter. ? ?Meds ordered this encounter  ?Medications  ? sulfamethoxazole-trimethoprim (BACTRIM DS) 800-160 MG tablet  ?  Sig: Take 1 tablet by mouth 2 (two) times daily.  ?  Dispense:  20 tablet  ?  Refill:  0  ? ? ? Procedures: ?No procedures performed ? ?Clinical Data: ?No additional findings. ? ?ROS: ? ?All other systems negative, except as noted in the HPI. ?Review of Systems ? ?Objective: ?Vital Signs: There were no vitals taken for this visit. ? ?Specialty Comments:  ?No specialty comments available. ? ?PMFS History: ?Patient Active Problem List  ? Diagnosis Date Noted  ? Coccyx pain 12/18/2020  ? 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[place]) 10/28/2020  ? Memory loss 11/21/2019  ? Mild nonproliferative diabetic retinopathy of both eyes ([place]) 10/23/2019  ? Nuclear sclerotic cataract of both eyes 10/23/2019  ? Posterior vitreous  detachment of right eye 10/23/2019  ? Retinal hemorrhage of left eye 10/23/2019  ? Impingement syndrome of right shoulder 07/06/2017  ? Iron deficiency anemia 05/11/2017  ? Morbid obesity ([place]) 04/24/2017  ? Anemia 02/24/2017  ? Baker's cyst 07/10/2016  ? Onychomycosis 12/07/2015  ? Upper airway cough syndrome 03/05/2014  ? Hypertension associated with diabetes ([place]) 12/05/2006  ? T2DM (type 2 diabetes mellitus) ([place]) 10/12/2006  ? Dyslipidemia associated with type 2 diabetes mellitus ([place]) 10/12/2006  ? Allergic rhinitis 10/12/2006  ? GERD - Followed by Dr. Fuller Plan 10/12/2006  ? IRRITABLE BOWEL SYNDROME - followed by Dr. Fuller Plan 10/12/2006  ? Peripheral neuropathy 10/11/2006  ? ALOPECIA NEC 10/11/2006  ? ?Past Medical History:  ?Diagnosis Date  ? Alopecia   ? Anemia, mild   ? Arthritis   ? Chronic cough   ? sees pulmonologist  ? Chronic pain   ? chest wall and abd - s/p extensive eval  ? Diabetes mellitus with neuropathy ([place])   ? sees endocrine  ? Diverticulosis   ? Dyspnea   ? Fatty liver   ? GERD (gastroesophageal reflux disease)   ? takes Nexium bid, hx erosive esophagitis  ? Headache(784.0)   ? occasionally;r/t sinus   ? History of colon polyps   ? HTN (hypertension)   ? Hx of amputation of lesser toe ([place])   ? sees podiatrist  ? Hyperlipemia   ? IBS (irritable bowel syndrome)   ? Insomnia   ? takes Elavil nightly  ? Joint pain   ? Neuropathy   ? Neuropathy   ? Osteomyelitis ([place])   ? Pneumonia   ? 89/2/22- patient denies  ? PONV (postoperative nausea and vomiting)   ? medication did not help with surgery on 03/28/20  ? Seasonal allergies   ? takes Zyrtec daily  ? Sinus tachycardia   ?  ?Family History  ?Problem Relation Age of Onset  ? Lung cancer Mother 63  ?     smoked heavily  ? Emphysema Mother   ? Hypertension Father   ? Hyperlipidemia Father   ? Diabetes Father   ? Coronary artery disease Father   ? Dementia Father   ? COPD Father   ?     smoked  ? Stomach cancer Paternal Aunt   ? Brain cancer Paternal Uncle    ? Irritable bowel  syndrome Other   ?     Several family members on fathers side   ? Diabetes Other   ? Stomach cancer Maternal Aunt   ? Lung cancer Paternal Uncle   ? Heart disease Paternal Uncle   ? Colon cancer Neg Hx   ?  ?Past Surgical History:  ?Procedure Laterality Date  ? AMPUTATION  12/28/2011  ? Procedure: AMPUTATION DIGIT;  Surgeon: Newt Minion, MD;  Location: [facility];  Service: Orthopedics;  Laterality: Right;  Right Foot 2nd Toe Amputation at MTP (metatarsophalangeal joint)  ? AMPUTATION Right 04/25/2012  ? Procedure: Right Foot 3rd Toe Amputation;  Surgeon: Newt Minion, MD;  Location: [facility];  Service: Orthopedics;  Laterality: Right;  Right Foot Third Toe Amputation   ? AMPUTATION Right 07/27/2012  ? Procedure: Right 4th Toe Amputation at Metatarsophalangeal;  Surgeon: Newt Minion, MD;  Location: [facility];  Service: Orthopedics;  Laterality: Right;  Right 4th Toe Amputation at Metatarsophalangeal  ? AMPUTATION Left 07/15/2016  ? Procedure: Left 2nd Ray Amputation;  Surgeon: Newt Minion, MD;  Location: [facility];  Service: Orthopedics;  Laterality: Left;  ? AMPUTATION Left 11/18/2016  ? Procedure: Left 3rd and 4th Ray Amputation;  Surgeon: Newt Minion, MD;  Location: [facility];  Service: Orthopedics;  Laterality: Left;  ? AMPUTATION Right 03/29/2017  ? Procedure: RIGHT FOOT 3RD AND 4TH RAY AMPUTATION;  Surgeon: Newt Minion, MD;  Location: [facility];  Service: Orthopedics;  Laterality: Right;  ? AMPUTATION Left 08/12/2020  ? Procedure: LEFT TRANSMETATARSAL AMPUTATION;  Surgeon: Newt Minion, MD;  Location: [facility];  Service: Orthopedics;  Laterality: Left;  ? APPLICATION OF WOUND VAC Left 08/12/2020  ? Procedure: APPLICATION OF WOUND VAC;  Surgeon: Newt Minion, MD;  Location: [facility];  Service: Orthopedics;  Laterality: Left;  ? COLONOSCOPY    ? LAPAROSCOPIC APPENDECTOMY  01/05/2011  ? Procedure: APPENDECTOMY LAPAROSCOPIC;  Surgeon: Pedro Earls, MD;  [facility];  Service: General;  Laterality: N/A;  ?  OOPHORECTOMY  2001  ? ROTATOR CUFF REPAIR Right   ? x 2  ? TUBAL LIGATION    ? VAGINAL HYSTERECTOMY  2001  ? ?Social History  ? ?Occupational History  ? Occupation: Retired   ?Tobacco Use  ? Smoking status: N

## 2021-04-19 ENCOUNTER — Telehealth: Payer: Self-pay | Admitting: *Deleted

## 2021-04-19 ENCOUNTER — Encounter: Payer: Self-pay | Admitting: Nurse Practitioner

## 2021-04-19 ENCOUNTER — Encounter: Payer: Self-pay | Admitting: Orthopedic Surgery

## 2021-04-19 NOTE — Telephone Encounter (Signed)
Patient reports development of rash on legs/back on 04/17/21. Had started Bactrim 4/6 for infection of foot. Also had feraheme on 4/6--wanted office aware. ?Called patient back and informed her it is very doubtful that her rash is from iron. Most likely the antibiotic. She agrees and had already called Dr. Sharol Given, who put her on Bactrim. He plans on seeing her on 04/22/21. ?

## 2021-04-20 ENCOUNTER — Ambulatory Visit: Payer: Medicare Other | Admitting: Orthopedic Surgery

## 2021-04-22 ENCOUNTER — Ambulatory Visit (INDEPENDENT_AMBULATORY_CARE_PROVIDER_SITE_OTHER): Payer: Medicare Other | Admitting: Orthopedic Surgery

## 2021-04-22 DIAGNOSIS — L97511 Non-pressure chronic ulcer of other part of right foot limited to breakdown of skin: Secondary | ICD-10-CM | POA: Diagnosis not present

## 2021-04-23 DIAGNOSIS — Z20822 Contact with and (suspected) exposure to covid-19: Secondary | ICD-10-CM | POA: Diagnosis not present

## 2021-05-03 ENCOUNTER — Encounter: Payer: Self-pay | Admitting: Orthopedic Surgery

## 2021-05-03 NOTE — Progress Notes (Signed)
? ?Office Visit Note ?  ?[name]           ?Date of Birth: [birth year]           ?MRN: [id number] ?Visit Date: 04/22/2021 ?             ?Requested by: Vivi Barrack, MD ?[place] ?[place],  [place] [address] ?PCP: Vivi Barrack, MD ? ?Chief Complaint  ?Patient presents with  ? Right Foot - Follow-up  ? ? ? ? ?HPI: ?Patient is a 76 year old woman who presents in follow-up for plantar ulcer right foot.  She stopped her Bactrim DS over the weekend secondary to a rash.  Patient states she is feeling better she states she has a small spot of drainage. ? ?Assessment & Plan: ?Visit Diagnoses:  ?1. Non-pressure chronic ulcer of other part of right foot limited to breakdown of skin ([place])   ? ? ?Plan: Patient will advance her activities as tolerated. ? ?Follow-Up Instructions: Return in about 4 weeks (around 05/20/2021).  ? ?Ortho Exam ? ?Patient is alert, oriented, no adenopathy, well-dressed, normal affect, normal respiratory effort. ?Examination the callus from around the ulcer was pared on the left and right foot there is no open wounds no infection no exposed bone or tendon no cellulitis.  Patient has had interval healing. ? ?Imaging: ?No results found. ?No images are attached to the encounter. ? ?Labs: ?Lab Results  ?Component Value Date  ? HGBA1C 8.1 (H) 08/12/2020  ? HGBA1C 8.4 (H) 05/29/2020  ? HGBA1C 8.0 (A) 03/18/2020  ? ESRSEDRATE 51 (H) 12/24/2019  ? ESRSEDRATE 36 (H) 02/24/2017  ? CRP 13.2 (H) 12/24/2019  ? CRP 1.2 (H) 02/24/2017  ? LABURIC 5.5 09/01/2015  ? REPTSTATUS 06/03/2020 FINAL 05/29/2020  ? CULT  05/29/2020  ?  NO GROWTH 5 DAYS ?Performed at [facility], [place] [address] [place], [place] [address] ?  ? LABORGA GROUP B STREP ([place]) ISOLATED 12/26/2014  ? ? ? ?Lab Results  ?Component Value Date  ? ALBUMIN 3.8 09/08/2020  ? ALBUMIN 3.4 (L) 05/29/2020  ? ALBUMIN 3.6 12/26/2019  ? PREALBUMIN 19.4 03/05/2012  ? ? ?No results found for: MG ?No results found for: VD25OH ? ?Lab  Results  ?Component Value Date  ? PREALBUMIN 19.4 03/05/2012  ? ? ?  Latest Ref Rng & Units 04/01/2021  ?  7:45 AM 02/04/2021  ?  7:51 AM 11/05/2020  ?  7:56 AM  ?CBC EXTENDED  ?WBC 4.0 - 10.5 K/uL 11.5   12.8   11.4    ?RBC 3.87 - 5.11 MIL/uL 4.25   4.21   4.11    ?Hemoglobin 12.0 - 15.0 g/dL 11.1   10.9   10.5    ?HCT 36.0 - 46.0 % 35.7   34.7   33.8    ?Platelets 150 - 400 K/uL 400   410   409    ?NEUT# 1.7 - 7.7 K/uL 7.1   9.1   7.3    ?Lymph# 0.7 - 4.0 K/uL 3.4   2.7   3.0    ? ? ? ?There is no height or weight on file to calculate BMI. ? ?Orders:  ?No orders of the defined types were placed in this encounter. ? ?No orders of the defined types were placed in this encounter. ? ? ? Procedures: ?No procedures performed ? ?Clinical Data: ?No additional findings. ? ?ROS: ? ?All other systems negative, except as noted in the HPI. ?Review of Systems ? ?  Objective: ?Vital Signs: There were no vitals taken for this visit. ? ?Specialty Comments:  ?No specialty comments available. ? ?PMFS History: ?Patient Active Problem List  ? Diagnosis Date Noted  ? Coccyx pain 12/18/2020  ? 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[place]) 10/28/2020  ? Memory loss 11/21/2019  ? Mild nonproliferative diabetic retinopathy of both eyes ([place]) 10/23/2019  ? Nuclear sclerotic cataract of both eyes 10/23/2019  ? Posterior vitreous detachment of right eye 10/23/2019  ? Retinal hemorrhage of left eye 10/23/2019  ? Impingement syndrome of right shoulder 07/06/2017  ? Iron deficiency anemia 05/11/2017  ? Morbid obesity ([place]) 04/24/2017  ? Anemia 02/24/2017  ? Baker's cyst 07/10/2016  ? Onychomycosis 12/07/2015  ? Upper airway cough syndrome 03/05/2014  ? Hypertension associated with diabetes ([place]) 12/05/2006  ? T2DM (type 2 diabetes mellitus) ([place]) 10/12/2006  ? Dyslipidemia associated with type 2 diabetes mellitus ([place]) 10/12/2006  ? Allergic rhinitis 10/12/2006  ? GERD - Followed by Dr. Fuller Plan 10/12/2006  ? IRRITABLE BOWEL SYNDROME -  followed by Dr. Fuller Plan 10/12/2006  ? Peripheral neuropathy 10/11/2006  ? ALOPECIA NEC 10/11/2006  ? ?Past Medical History:  ?Diagnosis Date  ? Alopecia   ? Anemia, mild   ? Arthritis   ? Chronic cough   ? sees pulmonologist  ? Chronic pain   ? chest wall and abd - s/p extensive eval  ? Diabetes mellitus with neuropathy ([place])   ? sees endocrine  ? Diverticulosis   ? Dyspnea   ? Fatty liver   ? GERD (gastroesophageal reflux disease)   ? takes Nexium bid, hx erosive esophagitis  ? Headache(784.0)   ? occasionally;r/t sinus   ? History of colon polyps   ? HTN (hypertension)   ? Hx of amputation of lesser toe ([place])   ? sees podiatrist  ? Hyperlipemia   ? IBS (irritable bowel syndrome)   ? Insomnia   ? takes Elavil nightly  ? Joint pain   ? Neuropathy   ? Neuropathy   ? Osteomyelitis ([place])   ? Pneumonia   ? 89/2/22- patient denies  ? PONV (postoperative nausea and vomiting)   ? medication did not help with surgery on 03/28/20  ? Seasonal allergies   ? takes Zyrtec daily  ? Sinus tachycardia   ?  ?Family History  ?Problem Relation Age of Onset  ? Lung cancer Mother 50  ?     smoked heavily  ? Emphysema Mother   ? Hypertension Father   ? Hyperlipidemia Father   ? Diabetes Father   ? Coronary artery disease Father   ? Dementia Father   ? COPD Father   ?     smoked  ? Stomach cancer Paternal Aunt   ? Brain cancer Paternal Uncle   ? Irritable bowel syndrome Other   ?     Several family members on fathers side   ? Diabetes Other   ? Stomach cancer Maternal Aunt   ? Lung cancer Paternal Uncle   ? Heart disease Paternal Uncle   ? Colon cancer Neg Hx   ?  ?Past Surgical History:  ?Procedure Laterality Date  ? AMPUTATION  12/28/2011  ? Procedure: AMPUTATION DIGIT;  Surgeon: Newt Minion, MD;  Location: [place];  Service: Orthopedics;  Laterality: Right;  Right Foot 2nd Toe Amputation at MTP (metatarsophalangeal joint)  ? AMPUTATION Right 04/25/2012  ? Procedure: Right Foot 3rd Toe Amputation;  Surgeon: Newt Minion, MD;  Location: [place];  Service: Orthopedics;  Laterality: Right;  Right Foot Third Toe Amputation   ? AMPUTATION Right 07/27/2012  ? Procedure: Right 4th Toe Amputation at Metatarsophalangeal;  Surgeon: Newt Minion, MD;  Location: [place];  Service: Orthopedics;  Laterality: Right;  Right 4th Toe Amputation at Metatarsophalangeal  ? AMPUTATION Left 07/15/2016  ? Procedure: Left 2nd Ray Amputation;  Surgeon: Newt Minion, MD;  Location: [place];  Service: Orthopedics;  Laterality: Left;  ? AMPUTATION Left 11/18/2016  ? Procedure: Left 3rd and 4th Ray Amputation;  Surgeon: Newt Minion, MD;  Location: [place];  Service: Orthopedics;  Laterality: Left;  ? AMPUTATION Right 03/29/2017  ? Procedure: RIGHT FOOT 3RD AND 4TH RAY AMPUTATION;  Surgeon: Newt Minion, MD;  Location: [place];  Service: Orthopedics;  Laterality: Right;  ? AMPUTATION Left 08/12/2020  ? Procedure: LEFT TRANSMETATARSAL AMPUTATION;  Surgeon: Newt Minion, MD;  Location: [place];  Service: Orthopedics;  Laterality: Left;  ? APPLICATION OF WOUND VAC Left 08/12/2020  ? Procedure: APPLICATION OF WOUND VAC;  Surgeon: Newt Minion, MD;  Location: [place];  Service: Orthopedics;  Laterality: Left;  ? COLONOSCOPY    ? LAPAROSCOPIC APPENDECTOMY  01/05/2011  ? Procedure: APPENDECTOMY LAPAROSCOPIC;  Surgeon: Pedro Earls, MD;  Location: [facility];  Service: General;  Laterality: N/A;  ? OOPHORECTOMY  2001  ? ROTATOR CUFF REPAIR Right   ? x 2  ? TUBAL LIGATION    ? VAGINAL HYSTERECTOMY  2001  ? ?Social History  ? ?Occupational History  ? Occupation: Retired   ?Tobacco Use  ? Smoking status: Never  ? Smokeless tobacco: Never  ?Vaping Use  ? Vaping Use: Never used  ?Substance and Sexual Activity  ? Alcohol use: Never  ? Drug use: Never  ? Sexual activity: Yes  ?  Birth control/protection: Surgical  ? ? ? ? ? ?

## 2021-05-06 ENCOUNTER — Ambulatory Visit (INDEPENDENT_AMBULATORY_CARE_PROVIDER_SITE_OTHER): Payer: Medicare Other | Admitting: Internal Medicine

## 2021-05-06 ENCOUNTER — Encounter: Payer: Self-pay | Admitting: Internal Medicine

## 2021-05-06 VITALS — BP 120/64 | HR 72 | Ht 66.0 in | Wt 227.4 lb

## 2021-05-06 DIAGNOSIS — E1165 Type 2 diabetes mellitus with hyperglycemia: Secondary | ICD-10-CM

## 2021-05-06 DIAGNOSIS — G63 Polyneuropathy in diseases classified elsewhere: Secondary | ICD-10-CM

## 2021-05-06 DIAGNOSIS — E1169 Type 2 diabetes mellitus with other specified complication: Secondary | ICD-10-CM | POA: Diagnosis not present

## 2021-05-06 DIAGNOSIS — E785 Hyperlipidemia, unspecified: Secondary | ICD-10-CM | POA: Diagnosis not present

## 2021-05-06 LAB — POCT GLYCOSYLATED HEMOGLOBIN (HGB A1C): Hemoglobin A1C: 7.8 % — AB (ref 4.0–5.6)

## 2021-05-06 MED ORDER — FREESTYLE LIBRE 2 SENSOR MISC: 3 refills | Status: DC

## 2021-05-06 MED ORDER — INSULIN PEN NEEDLE 32G X 4 MM MISC: 3 refills | Status: DC

## 2021-05-06 MED ORDER — FREESTYLE LIBRE 2 READER DEVI: 0 refills | Status: DC

## 2021-05-06 MED ORDER — TRESIBA FLEXTOUCH 200 UNIT/ML ~~LOC~~ SOPN: 34.0000 [IU] | PEN_INJECTOR | Freq: Every day | SUBCUTANEOUS | 3 refills | Status: DC

## 2021-05-06 NOTE — Patient Instructions (Signed)
Please continue: ?- Metformin 1000 mg 2x a day ? ?Use: ?Insulin Before breakfast Before lunch Before dinner At bedtime  ?Regular 25(-30) - 25(-30)   ? ?Please change NPH to: ?- Tresiba 34 units daily ? ?Please return in 4 months with your sugar log ?

## 2021-05-06 NOTE — Progress Notes (Signed)
Subjective:  ?  ? Patient ID: Beth Holden, female   DOB: 20-Apr-1945, 76 y.o.   MRN: [id number] ? ?This visit occurred during the SARS-CoV-2 public health emergency.  Safety protocols were in place, including screening questions prior to the visit, additional usage of staff PPE, and extensive cleaning of exam room while observing appropriate contact time as indicated for disinfecting solutions.  ? ?HPI ?Beth Holden is a pleasant 76 y.o. 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  Last visit 7 months ago. ? ?Interim history: ?She has occasional blurry vision, nausea, chest pain. She also has increased urination -unchanged. ?She had a recent foot infection >> on ABx. ?She had an iron infusion and started Iron tablets. ?She tells me that she was able to feel the [place] prescription after last visit, however, she did not have pen needles so was not able to start it. ? ?Reviewed HbA1c levels: ?Lab Results  ?Component Value Date  ? HGBA1C 8.1 (H) 08/12/2020  ? HGBA1C 8.4 (H) 05/29/2020  ? HGBA1C 8.0 (A) 03/18/2020  ? ?She is on:: ?- Metformin 1000 mg 2x a day ?- Insulin Before breakfast Before lunch Before dinner At bedtime  ?Regular 25-30  >> 25-30   ?- NPH 20 >>  15 units in a.m. and 10 >> 15 units at bedtime. ?She also tried: ?[place] 03/2020 -could not start b/c of  $$$ ?Levemir 47 units in am and 37 units in pm >> $800 for 3 mo supply ?Januvia 100 mg daily - $450 for 3 mo ?Invokana 100 mg - $455 for 3 mo ?Took [place] before ?We discussed about starting her on a VGo mechanical pump in the past. She had an appointment with diabetes education but decided not to pursue it. We also tried a sliding scale of regular insulin in the past but she was not using it. ?We tried Trulicity but she could not tolerate it due to nausea, diarrhea, constipation, weakness. ? ?She  checks her sugars 2-4 times a day per review of her log: ?- am:  71, 110-198, 213 >> 113-227 >> 98, 129-230, 252 >> 122, 150-289 ?- 2h after b'fast : n/c >> 199-325 >> 146-314 >> 190-295 >> 237-278 ?- prelunch:    100-200s >> n/c > 214-314  >> 158-207 >> 107-354 ?- 2-3h after lunch:  58,  144-275 >> 135-361 >> 130-204, 242 >> 121-337 ?- before dinner:  222 >> 126-215 >> 72, 128-195, 236 >> 58, 153-217 ?- after dinner:  213-246 >> 154, 210-336 >> 111-222, 265  >> 168-337 ?- bedtime: 66, 105-256 >> 127-285 >> 119-222, 302 >> 94-373 ?- nighttime:  123-241 >> 82-142 >> 60, 79, 89-246 >> 52, 68-208, 330 ?Lowest: 40s x3 (did not eat supper) >> ...82 >> 50s >>  52.  Has hypoglycemia awareness in the 70s. ?Highest: 383 (Prednisone) >> 300s.>> 388 ? ?Meals: ?- Breakfast: egg, bacon, toast; grits; biscuit; fruit; sometimes skips >> 2 slices of toast + butter ?- Lunch: 1/2 PB sandwich or soup and yoghurt, sometimes crackers ?- Dinner: meat + vegetables + some starch ?- Snacks: fruit  ? ?+ Mild CKD: ?BUN  ?Date Value Ref Range Status  ?09/08/2020 27 (H) 6 - 23 mg/dL Final  ? ?Creatinine, Ser  ?Date Value Ref Range Status  ?09/08/2020 0.98 0.40 - 1.20 mg/dL Final  ?On irbesartan. ? ?-+ HL; latest lipid panel: ?Lab Results  ?Component Value Date  ?  CHOL 127 10/28/2020  ? HDL 46.90 10/28/2020  ? [place] 47 10/28/2020  ? TRIG 163.0 (H) 10/28/2020  ? CHOLHDL 3 10/28/2020  ?On rosuvastatin 20. ?She has fatty liver >> was advised to lose weight. ? ?- last eye exam: 02/2021: No DR. Prev. Mild NPDR OU, without macular edema (Dr. Zadie Rhine). ? ?-+ Numbness and tingling in feet-previously on amitriptyline, now on Neurontin. ? ?Derm: Dr. Jarome Matin.  ? ? ?Review of Systems  ?+ see HPI ?Neurological: no tremors/+ numbness/+ tingling/no dizziness ? ?I reviewed pt's medications, allergies, PMH, social hx, family hx, and changes were documented in the history of present illness. Otherwise, unchanged from my initial visit note. ? ?Past Medical  History:  ?Diagnosis Date  ? Alopecia   ? Anemia, mild   ? Arthritis   ? Chronic cough   ? sees pulmonologist  ? Chronic pain   ? chest wall and abd - s/p extensive eval  ? Diabetes mellitus with neuropathy ([place])   ? sees endocrine  ? Diverticulosis   ? Dyspnea   ? Fatty liver   ? GERD (gastroesophageal reflux disease)   ? takes Nexium bid, hx erosive esophagitis  ? Headache(784.0)   ? occasionally;r/t sinus   ? History of colon polyps   ? HTN (hypertension)   ? Hx of amputation of lesser toe ([place])   ? sees podiatrist  ? Hyperlipemia   ? IBS (irritable bowel syndrome)   ? Insomnia   ? takes Elavil nightly  ? Joint pain   ? Neuropathy   ? Neuropathy   ? Osteomyelitis ([place])   ? Pneumonia   ? 89/2/22- patient denies  ? PONV (postoperative nausea and vomiting)   ? medication did not help with surgery on 03/28/20  ? Seasonal allergies   ? takes Zyrtec daily  ? Sinus tachycardia   ? ?Past Surgical History:  ?Procedure Laterality Date  ? AMPUTATION  12/28/2011  ? Procedure: AMPUTATION DIGIT;  Surgeon: Newt Minion, MD;  Location: [place];  Service: Orthopedics;  Laterality: Right;  Right Foot 2nd Toe Amputation at MTP (metatarsophalangeal joint)  ? AMPUTATION Right 04/25/2012  ? Procedure: Right Foot 3rd Toe Amputation;  Surgeon: Newt Minion, MD;  Location: [place];  Service: Orthopedics;  Laterality: Right;  Right Foot Third Toe Amputation   ? AMPUTATION Right 07/27/2012  ? Procedure: Right 4th Toe Amputation at Metatarsophalangeal;  Surgeon: Newt Minion, MD;  Location: [place];  Service: Orthopedics;  Laterality: Right;  Right 4th Toe Amputation at Metatarsophalangeal  ? AMPUTATION Left 07/15/2016  ? Procedure: Left 2nd Ray Amputation;  Surgeon: Newt Minion, MD;  Location: [place];  Service: Orthopedics;  Laterality: Left;  ? AMPUTATION Left 11/18/2016  ? Procedure: Left 3rd and 4th Ray Amputation;  Surgeon: Newt Minion, MD;  Location: [place];  Service: Orthopedics;  Laterality: Left;  ? AMPUTATION Right 03/29/2017  ?  Procedure: RIGHT FOOT 3RD AND 4TH RAY AMPUTATION;  Surgeon: Newt Minion, MD;  Location: [place] [facility];  Service: Orthopedics;  Laterality: Right;  ? AMPUTATION Left 08/12/2020  ? Procedure: LEFT TRANSMETATARSAL AMPUTATION;  Surgeon: Newt Minion, MD;  Location: [facility];  Service: Orthopedics;  Laterality: Left;  ? APPLICATION OF WOUND VAC Left 08/12/2020  ? Procedure: APPLICATION OF WOUND VAC;  Surgeon: Newt Minion, MD;  Location: [facility];  Service: Orthopedics;  Laterality: Left;  ? COLONOSCOPY    ? LAPAROSCOPIC APPENDECTOMY  01/05/2011  ? Procedure: APPENDECTOMY LAPAROSCOPIC;  Surgeon:  Pedro Earls, MD;  [facility];  Service: General;  Laterality: N/A;  ? OOPHORECTOMY  2001  ? ROTATOR CUFF REPAIR Right   ? x 2  ? TUBAL LIGATION    ? VAGINAL HYSTERECTOMY  2001  ? ?Social History  ? ?Socioeconomic History  ? Marital status: Married  ?  Spouse name: Not on file  ? Number of children: 2  ? Years of education: Not on file  ? Highest education level: Not on file  ?Occupational History  ? Occupation: Retired   ?Tobacco Use  ? Smoking status: Never  ? Smokeless tobacco: Never  ?Vaping Use  ? Vaping Use: Never used  ?Substance and Sexual Activity  ? Alcohol use: Never  ? Drug use: Never  ? Sexual activity: Yes  ?  Birth control/protection: Surgical  ?Other Topics Concern  ? Not on file  ?Social History Narrative  ? Caffeine daily   ? [employer], [employer] no diploma  ? Married '66  ? 1 dtr- '78; 1 son '71; 2 grandchildren  ? Occupation: retired 04  ? Dad with alzheimers-had to place in [place] (summer '10)  ?   ?   ? ?Social Determinants of Health  ? ?Financial Resource Strain: Not on file  ?Food Insecurity: Not on file  ?Transportation Needs: Not on file  ?Physical Activity: Not on file  ?Stress: Not on file  ?Social Connections: Not on file  ?Intimate Partner Violence: Not on file  ? ?Current Outpatient Medications on File Prior to Visit  ?Medication Sig Dispense Refill  ? acetaminophen (TYLENOL) 500 MG tablet Take 500 mg by mouth  every 8 (eight) hours as needed for mild pain.     ? azelastine (ASTELIN) 0.1 % nasal spray Place 2 sprays into both nostrils 2 (two) times daily. 30 mL 12  ? betamethasone dipropionate (DIPROLENE) 0.05 % cream App

## 2021-05-11 ENCOUNTER — Ambulatory Visit (INDEPENDENT_AMBULATORY_CARE_PROVIDER_SITE_OTHER): Payer: Medicare Other

## 2021-05-11 ENCOUNTER — Encounter: Payer: Self-pay | Admitting: Orthopedic Surgery

## 2021-05-11 ENCOUNTER — Ambulatory Visit (INDEPENDENT_AMBULATORY_CARE_PROVIDER_SITE_OTHER): Payer: Medicare Other | Admitting: Orthopedic Surgery

## 2021-05-11 DIAGNOSIS — M79671 Pain in right foot: Secondary | ICD-10-CM

## 2021-05-11 DIAGNOSIS — L97511 Non-pressure chronic ulcer of other part of right foot limited to breakdown of skin: Secondary | ICD-10-CM | POA: Diagnosis not present

## 2021-05-11 NOTE — Progress Notes (Signed)
? ?Office Visit Note ?  ?[name]           ?Date of Birth: Feb 14, 1945           ?MRN: [id number] ?Visit Date: 05/11/2021 ?             ?Requested by: Vivi Barrack, MD ?[address] ?[place],  [place] [address] ?PCP: Vivi Barrack, MD ? ?Chief Complaint  ?Patient presents with  ? Right Foot - Wound Check  ? ? ? ? ?HPI: ?Patient is a 76 year old woman who is seen in follow-up for both lower extremities she states she has had some pink drainage from the plantar ulcer right foot she has had maceration and pain. ? ?Assessment & Plan: ?Visit Diagnoses:  ?1. Pain in right foot   ?2. Non-pressure chronic ulcer of other part of right foot limited to breakdown of skin ([place])   ? ? ?Plan: Ulcer was debrided there is healthy granulation tissue at the base this does not probe to bone or tendon.  Continue with protective shoe wear dressing changes. ? ?Follow-Up Instructions: Return in about 3 weeks (around 06/01/2021).  ? ?Ortho Exam ? ?Patient is alert, oriented, no adenopathy, well-dressed, normal affect, normal respiratory effort. ?Examination patient has venous stasis swelling of both legs.  No open ulcers.  The left transmetatarsal amputation has callus that was pared there were no deep ulcers.  Examination of the right foot she has an ulcer beneath the second and third metatarsals.  After informed consent a 10 blade knife was used to debride the skin and soft tissue back to healthy viable granulation tissue.  The ulcer was 5 mm in diameter prior to debridement 10 mm in diameter after debridement and 1 mm deep.  This had no tunneling did not probe to bone or tendon.  There is no redness or cellulitis in the plantar aspect of her foot with elevation the redness on the dorsum of the foot resolves there is no tenderness to palpation. ? ?Imaging: ?XR Foot 2 Views Right ? ?Result Date: 05/11/2021 ?2 view radiographs of the right foot shows no lytic bony abnormalities.  There are degenerative arthritic changes.  ?No  images are attached to the encounter. ? ?Labs: ?Lab Results  ?Component Value Date  ? HGBA1C 7.8 (A) 05/06/2021  ? HGBA1C 8.1 (H) 08/12/2020  ? HGBA1C 8.4 (H) 05/29/2020  ? ESRSEDRATE 51 (H) 12/24/2019  ? ESRSEDRATE 36 (H) 02/24/2017  ? CRP 13.2 (H) 12/24/2019  ? CRP 1.2 (H) 02/24/2017  ? LABURIC 5.5 09/01/2015  ? REPTSTATUS 06/03/2020 FINAL 05/29/2020  ? CULT  05/29/2020  ?  NO GROWTH 5 DAYS ?Performed at [facility], [place] [address] [place], [place] [address] ?  ? LABORGA GROUP B STREP ([place]) ISOLATED 12/26/2014  ? ? ? ?Lab Results  ?Component Value Date  ? ALBUMIN 3.8 09/08/2020  ? ALBUMIN 3.4 (L) 05/29/2020  ? ALBUMIN 3.6 12/26/2019  ? PREALBUMIN 19.4 03/05/2012  ? ? ?No results found for: MG ?No results found for: VD25OH ? ?Lab Results  ?Component Value Date  ? PREALBUMIN 19.4 03/05/2012  ? ? ?  Latest Ref Rng & Units 04/01/2021  ?  7:45 AM 02/04/2021  ?  7:51 AM 11/05/2020  ?  7:56 AM  ?CBC EXTENDED  ?WBC 4.0 - 10.5 K/uL 11.5   12.8   11.4    ?RBC 3.87 - 5.11 MIL/uL 4.25   4.21   4.11    ?Hemoglobin 12.0 - 15.0  g/dL 11.1   10.9   10.5    ?HCT 36.0 - 46.0 % 35.7   34.7   33.8    ?Platelets 150 - 400 K/uL 400   410   409    ?NEUT# 1.7 - 7.7 K/uL 7.1   9.1   7.3    ?Lymph# 0.7 - 4.0 K/uL 3.4   2.7   3.0    ? ? ? ?There is no height or weight on file to calculate BMI. ? ?Orders:  ?Orders Placed This Encounter  ?Procedures  ? XR Foot 2 Views Right  ? ?No orders of the defined types were placed in this encounter. ? ? ? Procedures: ?No procedures performed ? ?Clinical Data: ?No additional findings. ? ?ROS: ? ?All other systems negative, except as noted in the HPI. ?Review of Systems ? ?Objective: ?Vital Signs: There were no vitals taken for this visit. ? ?Specialty Comments:  ?No specialty comments available. ? ?PMFS History: ?Patient Active Problem List  ? Diagnosis Date Noted  ? Coccyx pain 12/18/2020  ? 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[place]) 10/28/2020  ? Memory loss 11/21/2019  ?  Mild nonproliferative diabetic retinopathy of both eyes ([place]) 10/23/2019  ? Nuclear sclerotic cataract of both eyes 10/23/2019  ? Posterior vitreous detachment of right eye 10/23/2019  ? Retinal hemorrhage of left eye 10/23/2019  ? Impingement syndrome of right shoulder 07/06/2017  ? Iron deficiency anemia 05/11/2017  ? Morbid obesity ([place]) 04/24/2017  ? Anemia 02/24/2017  ? Baker's cyst 07/10/2016  ? Onychomycosis 12/07/2015  ? Upper airway cough syndrome 03/05/2014  ? Hypertension associated with diabetes ([place]) 12/05/2006  ? T2DM (type 2 diabetes mellitus) ([place]) 10/12/2006  ? Dyslipidemia associated with type 2 diabetes mellitus ([place]) 10/12/2006  ? Allergic rhinitis 10/12/2006  ? GERD - Followed by Dr. Fuller Plan 10/12/2006  ? IRRITABLE BOWEL SYNDROME - followed by Dr. Fuller Plan 10/12/2006  ? Peripheral neuropathy 10/11/2006  ? ALOPECIA NEC 10/11/2006  ? ?Past Medical History:  ?Diagnosis Date  ? Alopecia   ? Anemia, mild   ? Arthritis   ? Chronic cough   ? sees pulmonologist  ? Chronic pain   ? chest wall and abd - s/p extensive eval  ? Diabetes mellitus with neuropathy ([place])   ? sees endocrine  ? Diverticulosis   ? Dyspnea   ? Fatty liver   ? GERD (gastroesophageal reflux disease)   ? takes Nexium bid, hx erosive esophagitis  ? Headache(784.0)   ? occasionally;r/t sinus   ? History of colon polyps   ? HTN (hypertension)   ? Hx of amputation of lesser toe ([place])   ? sees podiatrist  ? Hyperlipemia   ? IBS (irritable bowel syndrome)   ? Insomnia   ? takes Elavil nightly  ? Joint pain   ? Neuropathy   ? Neuropathy   ? Osteomyelitis ([place])   ? Pneumonia   ? 89/2/22- patient denies  ? PONV (postoperative nausea and vomiting)   ? medication did not help with surgery on 03/28/20  ? Seasonal allergies   ? takes Zyrtec daily  ? Sinus tachycardia   ?  ?Family History  ?Problem Relation Age of Onset  ? Lung cancer Mother 4  ?     smoked heavily  ? Emphysema Mother   ? Hypertension Father   ? Hyperlipidemia Father   ? Diabetes Father    ? Coronary artery disease Father   ? Dementia Father   ? COPD Father   ?  smoked  ? Stomach cancer Paternal Aunt   ? Brain cancer Paternal Uncle   ? Irritable bowel syndrome Other   ?     Several family members on fathers side   ? Diabetes Other   ? Stomach cancer Maternal Aunt   ? Lung cancer Paternal Uncle   ? Heart disease Paternal Uncle   ? Colon cancer Neg Hx   ?  ?Past Surgical History:  ?Procedure Laterality Date  ? AMPUTATION  12/28/2011  ? Procedure: AMPUTATION DIGIT;  Surgeon: Newt Minion, MD;  Location: [place];  Service: Orthopedics;  Laterality: Right;  Right Foot 2nd Toe Amputation at MTP (metatarsophalangeal joint)  ? AMPUTATION Right 04/25/2012  ? Procedure: Right Foot 3rd Toe Amputation;  Surgeon: Newt Minion, MD;  Location: [place];  Service: Orthopedics;  Laterality: Right;  Right Foot Third Toe Amputation   ? AMPUTATION Right 07/27/2012  ? Procedure: Right 4th Toe Amputation at Metatarsophalangeal;  Surgeon: Newt Minion, MD;  Location: [place];  Service: Orthopedics;  Laterality: Right;  Right 4th Toe Amputation at Metatarsophalangeal  ? AMPUTATION Left 07/15/2016  ? Procedure: Left 2nd Ray Amputation;  Surgeon: Newt Minion, MD;  Location: [place];  Service: Orthopedics;  Laterality: Left;  ? AMPUTATION Left 11/18/2016  ? Procedure: Left 3rd and 4th Ray Amputation;  Surgeon: Newt Minion, MD;  Location: [place];  Service: Orthopedics;  Laterality: Left;  ? AMPUTATION Right 03/29/2017  ? Procedure: RIGHT FOOT 3RD AND 4TH RAY AMPUTATION;  Surgeon: Newt Minion, MD;  Location: [place];  Service: Orthopedics;  Laterality: Right;  ? AMPUTATION Left 08/12/2020  ? Procedure: LEFT TRANSMETATARSAL AMPUTATION;  Surgeon: Newt Minion, MD;  Location: [facility];  Service: Orthopedics;  Laterality: Left;  ? APPLICATION OF WOUND VAC Left 08/12/2020  ? Procedure: APPLICATION OF WOUND VAC;  Surgeon: Newt Minion, MD;  Location: [place];  Service: Orthopedics;  Laterality: Left;  ? COLONOSCOPY    ? LAPAROSCOPIC  APPENDECTOMY  01/05/2011  ? Procedure: APPENDECTOMY LAPAROSCOPIC;  Surgeon: Pedro Earls, MD;  [facility];  Service: General;  Laterality: N/A;  ? OOPHORECTOMY  2001  ? ROTATOR CUFF REPAIR Right   ? x

## 2021-05-13 ENCOUNTER — Encounter: Payer: Self-pay | Admitting: Internal Medicine

## 2021-05-14 DIAGNOSIS — Z20822 Contact with and (suspected) exposure to covid-19: Secondary | ICD-10-CM | POA: Diagnosis not present

## 2021-05-20 ENCOUNTER — Ambulatory Visit: Payer: Medicare Other | Admitting: Orthopedic Surgery

## 2021-05-27 NOTE — Progress Notes (Signed)
Cardiology Office Note   Date:  05/28/2021   ID:  Beth Holden, Beth Holden 01-10-1946, MRN [id number]  PCP:  Vivi Barrack, MD  Cardiologist:   Dorris Carnes, MD   Pt returns for f/u of HTN and HL   History of Present Illness: Beth Holden is a 76 y.o. female with a history of Diabetes GERD, HTN, HL   I saw her Dec 2017 for the first time for CP   Pain was substernal  Breathing helped   Not associated with activity   Myovue was done which was normal    The patient also complained of some palpitations  Holter monitor showed no signif arrhythm     I recomm a trial of a low dose b blocke With cough I recomm she stop there ACE I  I saw the pt in Fall 2022  Since seen she says she has been doing okay from a cardiac standpoint.  Her breathing is overall good she gets occasional short of breath after coughing.  Not at other times.  She denies palpitations.  She continues to follow closely with Dr.'s Cruzita Lederer and Sharol Given    She does have some cramping of her R calf.  She says it  kept her up all night the other night.  She has an ulcer on that foot.     Outpatient Medications Prior to Visit  Medication Sig Dispense Refill   acetaminophen (TYLENOL) 500 MG tablet Take 500 mg by mouth every 8 (eight) hours as needed for mild pain.      azelastine (ASTELIN) 0.1 % nasal spray Place 2 sprays into both nostrils 2 (two) times daily. 30 mL 12   betamethasone dipropionate (DIPROLENE) 0.05 % cream Apply 1 application topically 2 (two) times daily as needed (IRRITATION). 30 g 0   Blood Glucose Monitoring Suppl (ONE TOUCH ULTRA 2) w/Device KIT Use to check blood sugar (Patient taking differently: 1 each by Other route daily.) 1 each 0   cetirizine (ZYRTEC) 10 MG tablet Take 1 tablet (10 mg total) by mouth daily. 90 tablet 3   cholecalciferol (VITAMIN D) 1000 units tablet Take 1,000 Units by mouth daily.     dicyclomine (BENTYL) 10 MG capsule Take 1 capsule (10 mg total) by mouth 3 (three) times daily as  needed for spasms. 90 capsule 5   Fluocinolone Acetonide 0.01 % OIL Use 1-2 drops daily as needed. (Patient taking differently: Place 1-2 drops into both ears daily as needed (ear irritation).) 20 mL 0   gabapentin (NEURONTIN) 100 MG capsule TAKE 2 TO 3 CAPSULES AT BEDTIME 180 capsule 3   glucagon (GLUCAGEN) 1 MG SOLR injection Inject 1 mg into the muscle once as needed for up to 1 dose for low blood sugar. 1 each 11   insulin degludec (TRESIBA FLEXTOUCH) 200 UNIT/ML FlexTouch Pen Inject 34 Units into the skin daily. 12 mL 3   Insulin Pen Needle 32G X 4 MM MISC Use 1x a day 100 each 3   insulin regular (NOVOLIN R RELION) 100 units/mL injection Inject 0.2-0.3 mLs (20-30 Units total) into the skin 3 (three) times daily before meals. Per sliding scale     Insulin Syringe-Needle U-100 (RELION INSULIN SYRINGE 1ML/31G) 31G X 5/16" 1 ML MISC USE 2 TIMES A DAY (Patient taking differently: 1 each by Other route 2 (two) times daily.) 100 each 2   irbesartan-hydrochlorothiazide (AVALIDE) 150-12.5 MG tablet Take 1 tablet by mouth daily. 90 tablet 3   metFORMIN (GLUCOPHAGE)  1000 MG tablet Take 1 tablet (1,000 mg total) by mouth 2 (two) times daily with a meal. 180 tablet 3   omeprazole (PRILOSEC) 40 MG capsule Take 1 capsule (40 mg total) by mouth 2 (two) times daily. 180 capsule 3   ONETOUCH ULTRA test strip USE  STRIP TO CHECK GLUCOSE THREE TIMES DAILY 300 each 3   OVER THE COUNTER MEDICATION Apply 1-3 application topically at bedtime as needed (foot pain). TOPRICIN FOOT CREAM - NEUROPATHY FOOT CREAM **APPLIES TO BOTH FEET AT BEDTIME**     Probiotic Product (PROBIOTIC DAILY PO) Take 1 capsule by mouth daily.     vitamin B-12 (CYANOCOBALAMIN) 1000 MCG tablet Take 1,000 mcg daily by mouth.     vitamin C (ASCORBIC ACID) 500 MG tablet Take 500 mg by mouth daily.     metoprolol succinate (TOPROL-XL) 25 MG 24 hr tablet Take 1 tablet (25 mg total) by mouth daily. 90 tablet 0   rosuvastatin (CRESTOR) 20 MG tablet Take  1 tablet (20 mg total) by mouth daily. 90 tablet 3   Continuous Blood Gluc Receiver (FREESTYLE LIBRE 2 READER) DEVI Use as instructed to check blood sugar (Patient not taking: Reported on 05/28/2021) 1 each 0   Continuous Blood Gluc Sensor (FREESTYLE LIBRE 2 SENSOR) MISC Use as instructed to check blood sugar. Change every 14 days (Patient not taking: Reported on 05/28/2021) 6 each 3   famotidine (PEPCID) 20 MG tablet  (Patient not taking: Reported on 05/28/2021)     insulin NPH Human (NOVOLIN N RELION) 100 UNIT/ML injection Inject 0.15-0.2 mLs (15-20 Units total) into the skin See admin instructions. Inject under skin 12-15 units in am  and 10-15 units at bedtime per sliding scale (Patient not taking: Reported on 05/28/2021)     methylPREDNISolone (MEDROL DOSEPAK) 4 MG TBPK tablet Take per packet instructions (Patient not taking: Reported on 05/28/2021) 1 each 0   nutrition supplement, JUVEN, (JUVEN) PACK Take 1 packet by mouth 2 (two) times daily between meals. (Patient not taking: Reported on 05/28/2021)  0   sulfamethoxazole-trimethoprim (BACTRIM DS) 800-160 MG tablet Take 1 tablet by mouth 2 (two) times daily. (Patient not taking: Reported on 05/28/2021) 20 tablet 0   No facility-administered medications prior to visit.     Allergies:   Doxycycline calcium, Codeine, and Propoxyphene hcl   Past Medical History:  Diagnosis Date   Alopecia    Anemia, mild    Arthritis    Chronic cough    sees pulmonologist   Chronic pain    chest wall and abd - s/p extensive eval   Diabetes mellitus with neuropathy (HCC)    sees endocrine   Diverticulosis    Dyspnea    Fatty liver    GERD (gastroesophageal reflux disease)    takes Nexium bid, hx erosive esophagitis   Headache(784.0)    occasionally;r/t sinus    History of colon polyps    HTN (hypertension)    Hx of amputation of lesser toe ([facility])    sees podiatrist   Hyperlipemia    IBS (irritable bowel syndrome)    Insomnia    takes Elavil nightly    Joint pain    Neuropathy    Neuropathy    Osteomyelitis ([place])    Pneumonia    89/2/22- patient denies   PONV (postoperative nausea and vomiting)    medication did not help with surgery on 03/28/20   Seasonal allergies    takes Zyrtec daily   Sinus tachycardia  Past Surgical History:  Procedure Laterality Date   AMPUTATION  12/28/2011   Procedure: AMPUTATION DIGIT;  Surgeon: Newt Minion, MD;  Location: [place];  Service: Orthopedics;  Laterality: Right;  Right Foot 2nd Toe Amputation at MTP (metatarsophalangeal joint)   AMPUTATION Right 04/25/2012   Procedure: Right Foot 3rd Toe Amputation;  Surgeon: Newt Minion, MD;  Location: [place];  Service: Orthopedics;  Laterality: Right;  Right Foot Third Toe Amputation    AMPUTATION Right 07/27/2012   Procedure: Right 4th Toe Amputation at Metatarsophalangeal;  Surgeon: Newt Minion, MD;  Location: [place];  Service: Orthopedics;  Laterality: Right;  Right 4th Toe Amputation at Metatarsophalangeal   AMPUTATION Left 07/15/2016   Procedure: Left 2nd Ray Amputation;  Surgeon: Newt Minion, MD;  Location: [facility];  Service: Orthopedics;  Laterality: Left;   AMPUTATION Left 11/18/2016   Procedure: Left 3rd and 4th Ray Amputation;  Surgeon: Newt Minion, MD;  Location: [place];  Service: Orthopedics;  Laterality: Left;   AMPUTATION Right 03/29/2017   Procedure: RIGHT FOOT 3RD AND 4TH RAY AMPUTATION;  Surgeon: Newt Minion, MD;  Location: [place];  Service: Orthopedics;  Laterality: Right;   AMPUTATION Left 08/12/2020   Procedure: LEFT TRANSMETATARSAL AMPUTATION;  Surgeon: Newt Minion, MD;  Location: [facility];  Service: Orthopedics;  Laterality: Left;   APPLICATION OF WOUND VAC Left 08/12/2020   Procedure: APPLICATION OF WOUND VAC;  Surgeon: Newt Minion, MD;  Location: [facility];  Service: Orthopedics;  Laterality: Left;   COLONOSCOPY     LAPAROSCOPIC APPENDECTOMY  01/05/2011   Procedure: APPENDECTOMY LAPAROSCOPIC;  Surgeon: Pedro Earls, MD;   [facility];  Service: General;  Laterality: N/A;   OOPHORECTOMY  2001   ROTATOR CUFF REPAIR Right    x 2   TUBAL LIGATION     VAGINAL HYSTERECTOMY  2001     Social History:  The patient  reports that she has never smoked. She has never used smokeless tobacco. She reports that she does not drink alcohol and does not use drugs.   Family History:  The patient's family history includes Brain cancer in her paternal uncle; COPD in her father; Coronary artery disease in her father; Dementia in her father; Diabetes in her father and another family member; Emphysema in her mother; Heart disease in her paternal uncle; Hyperlipidemia in her father; Hypertension in her father; Irritable bowel syndrome in an other family member; Lung cancer in her paternal uncle; Lung cancer (age of onset: 19) in her mother; Stomach cancer in her maternal aunt and paternal aunt.    ROS:  Please see the history of present illness. All other systems are reviewed and  Negative to the above problem except as noted.    PHYSICAL EXAM: VS:  BP (!) 110/58   Pulse 74   Ht _0  (1.676 m)   Wt 227 lb 12.8 oz (103.3 kg)   SpO2 96%   BMI 36.77 kg/m   GEN:  Morbidly obese 76 yo  in no acute distress  HEENT: normal  Neck: no JVD, no bruits   Cardiac: RRR; no murmurs  No LE edema   2+ PT   Respiratory:  clear to auscultation bilaterally, GI: soft, nontender, nondistended, + BS  No hepatomegaly  MS: s/p amputation in toes bilatera    Skin: warm and dry  Healed lesions on legs  Neuro: CN II to XII intact     EKG:  EKG shows  SR 74 bpm    Lipid Panel    Component Value Date/Time   CHOL 127 10/28/2020 1110   TRIG 163.0 (H) 10/28/2020 1110   HDL 46.90 10/28/2020 1110   CHOLHDL 3 10/28/2020 1110   VLDL 32.6 10/28/2020 1110   LDLCALC 47 10/28/2020 1110      Wt Readings from Last 3 Encounters:  05/28/21 227 lb 12.8 oz (103.3 kg)  05/06/21 227 lb 6.4 oz (103.1 kg)  04/01/21 226 lb 12.8 oz (102.9 kg)       ASSESSMENT AND PLAN:  1  Hx of dyspnea   Pt says occaional episodes, not associated with actvity   Follow        2  Hx palpitations  Pt denies     3  Hx Cp   Denies    Myoview in 2018 showed normal perfusion   LVEF normal    4  HTN  BP is controlled on current regimen   5  HL   Last lipids in October  LDL 47  HDL 47    Trig 163  6  DM   A1C 7.8 %         Current medicines are reviewed at length with the patient today.  The patient does not have concerns regarding medicines.  Signed, Dorris Carnes, MD  05/28/2021 10:13 AM    [address] [place], [place], [place]  [address] Phone: [phone]; Fax: [phone]

## 2021-05-28 ENCOUNTER — Encounter: Payer: Self-pay | Admitting: Internal Medicine

## 2021-05-28 ENCOUNTER — Ambulatory Visit (INDEPENDENT_AMBULATORY_CARE_PROVIDER_SITE_OTHER): Payer: Medicare Other | Admitting: Internal Medicine

## 2021-05-28 VITALS — BP 110/58 | HR 74 | Ht 66.0 in | Wt 227.8 lb

## 2021-05-28 DIAGNOSIS — Z79899 Other long term (current) drug therapy: Secondary | ICD-10-CM | POA: Diagnosis not present

## 2021-05-28 DIAGNOSIS — I10 Essential (primary) hypertension: Secondary | ICD-10-CM | POA: Diagnosis not present

## 2021-05-28 DIAGNOSIS — E1169 Type 2 diabetes mellitus with other specified complication: Secondary | ICD-10-CM

## 2021-05-28 DIAGNOSIS — E785 Hyperlipidemia, unspecified: Secondary | ICD-10-CM | POA: Diagnosis not present

## 2021-05-28 MED ORDER — ROSUVASTATIN CALCIUM 20 MG PO TABS: 20.0000 mg | ORAL_TABLET | Freq: Every day | ORAL | 3 refills | Status: DC

## 2021-05-28 MED ORDER — METOPROLOL SUCCINATE ER 25 MG PO TB24: 25.0000 mg | ORAL_TABLET | Freq: Every day | ORAL | 3 refills | Status: DC

## 2021-05-28 NOTE — Patient Instructions (Signed)
Medication Instructions:   *If you need a refill on your cardiac medications before your next appointment, please call your pharmacy*   Lab Work:  If you have labs (blood work) drawn today and your tests are completely normal, you will receive your results only by: MyChart Message (if you have MyChart) OR A paper copy in the mail If you have any lab test that is abnormal or we need to change your treatment, we will call you to review the results.   Testing/Procedures:    Follow-Up: At [facility], you and your health needs are our priority.  As part of our continuing mission to provide you with exceptional heart care, we have created designated Provider Care Teams.  These Care Teams include your primary Cardiologist (physician) and Advanced Practice Providers (APPs -  Physician Assistants and Nurse Practitioners) who all work together to provide you with the care you need, when you need it.  We recommend signing up for the patient portal called "MyChart".  Sign up information is provided on this After Visit Summary.  MyChart is used to connect with patients for Virtual Visits (Telemedicine).  Patients are able to view lab/test results, encounter notes, upcoming appointments, etc.  Non-urgent messages can be sent to your provider as well.   To learn more about what you can do with MyChart, go to [email or web address].     Important Information About Sugar       

## 2021-05-31 ENCOUNTER — Ambulatory Visit (INDEPENDENT_AMBULATORY_CARE_PROVIDER_SITE_OTHER): Payer: Medicare Other | Admitting: Orthopedic Surgery

## 2021-05-31 ENCOUNTER — Encounter: Payer: Self-pay | Admitting: Orthopedic Surgery

## 2021-05-31 DIAGNOSIS — L97511 Non-pressure chronic ulcer of other part of right foot limited to breakdown of skin: Secondary | ICD-10-CM | POA: Diagnosis not present

## 2021-05-31 NOTE — Progress Notes (Signed)
Office Visit Note   [name]           Date of Birth: 1945/12/14           MRN: [id number] Visit Date: 05/31/2021              Requested by: Vivi Barrack, MD [address] [place],  [place] [address] PCP: Vivi Barrack, MD  No chief complaint on file.     HPI: Patient is a 76 year old woman who presents in follow-up for hypertrophic callus left transmetatarsal amputation and ulceration plantar aspect of the right foot.  Patient has no protective shoe wear new orthotics.  Assessment & Plan: Visit Diagnoses:  1. Non-pressure chronic ulcer of other part of right foot limited to breakdown of skin ([place])     Plan: Ulcer was debrided on the right foot callus pared on the left foot.  Follow-Up Instructions: Return in about 4 weeks (around 06/28/2021).   Ortho Exam  Patient is alert, oriented, no adenopathy, well-dressed, normal affect, normal respiratory effort. Examination of the left foot there is no redness no cellulitis her foot is plantigrade she has some callus over the residual limb of the callus was pared there is no open wounds no cellulitis no drainage.  Examination the right foot the forefoot redness and swelling continues to resolve with elevation there is no redness there is no tenderness to palpation.  She has a healing Wagner grade 1 ulcer on the plantar aspect of the right forefoot.  Prior to debridement the ulcer is 5 mm in diameter after debridement of skin and soft tissue with a 10 blade knife after informed consent the ulcer was 1 cm diameter 1 mm deep there is healthy granulation tissue no exposed bone or tendon no drainage no odor no signs of infection.  Imaging: No results found. No images are attached to the encounter.  Labs: Lab Results  Component Value Date   HGBA1C 7.8 (A) 05/06/2021   HGBA1C 8.1 (H) 08/12/2020   HGBA1C 8.4 (H) 05/29/2020   ESRSEDRATE 51 (H) 12/24/2019   ESRSEDRATE 36 (H) 02/24/2017   CRP 13.2 (H) 12/24/2019   CRP  1.2 (H) 02/24/2017   LABURIC 5.5 09/01/2015   REPTSTATUS 06/03/2020 FINAL 05/29/2020   CULT  05/29/2020    NO GROWTH 5 DAYS Performed at [facility], [place] [address], [place], [place] [address]    LABORGA GROUP B STREP (S.AGALACTIAE) ISOLATED 12/26/2014     Lab Results  Component Value Date   ALBUMIN 3.8 09/08/2020   ALBUMIN 3.4 (L) 05/29/2020   ALBUMIN 3.6 12/26/2019   PREALBUMIN 19.4 03/05/2012    No results found for: MG No results found for: [facility]  Lab Results  Component Value Date   PREALBUMIN 19.4 03/05/2012      Latest Ref Rng & Units 04/01/2021    7:45 AM 02/04/2021    7:51 AM 11/05/2020    7:56 AM  CBC EXTENDED  WBC 4.0 - 10.5 K/uL 11.5   12.8   11.4    RBC 3.87 - 5.11 MIL/uL 4.25   4.21   4.11    Hemoglobin 12.0 - 15.0 g/dL 11.1   10.9   10.5    HCT 36.0 - 46.0 % 35.7   34.7   33.8    Platelets 150 - 400 K/uL 400   410   409    NEUT# 1.7 - 7.7 K/uL 7.1   9.1   7.3  Lymph# 0.7 - 4.0 K/uL 3.4   2.7   3.0       There is no height or weight on file to calculate BMI.  Orders:  No orders of the defined types were placed in this encounter.  No orders of the defined types were placed in this encounter.    Procedures: No procedures performed  Clinical Data: No additional findings.  ROS:  All other systems negative, except as noted in the HPI. Review of Systems  Objective: Vital Signs: There were no vitals taken for this visit.  Specialty Comments:  No specialty comments available.  PMFS History: Patient Active Problem List   Diagnosis Date Noted   Coccyx pain 12/18/2020   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[place]) 10/28/2020   Memory loss 11/21/2019   Mild nonproliferative diabetic retinopathy of both eyes ([place]) 10/23/2019   Nuclear sclerotic cataract of both eyes 10/23/2019   Posterior vitreous detachment of right eye 10/23/2019   Retinal hemorrhage of left eye 10/23/2019   Impingement syndrome of right shoulder  07/06/2017   Iron deficiency anemia 05/11/2017   Morbid obesity ([place]) 04/24/2017   Anemia 02/24/2017   Baker's cyst 07/10/2016   Onychomycosis 12/07/2015   Upper airway cough syndrome 03/05/2014   Hypertension associated with diabetes ([place]) 12/05/2006   T2DM (type 2 diabetes mellitus) ([place]) 10/12/2006   Dyslipidemia associated with type 2 diabetes mellitus ([place]) 10/12/2006   Allergic rhinitis 10/12/2006   GERD - Followed by Dr. Fuller Plan 10/12/2006   IRRITABLE BOWEL SYNDROME - followed by Dr. Fuller Plan 10/12/2006   Peripheral neuropathy 10/11/2006   ALOPECIA NEC 10/11/2006   Past Medical History:  Diagnosis Date   Alopecia    Anemia, mild    Arthritis    Chronic cough    sees pulmonologist   Chronic pain    chest wall and abd - s/p extensive eval   Diabetes mellitus with neuropathy (HCC)    sees endocrine   Diverticulosis    Dyspnea    Fatty liver    GERD (gastroesophageal reflux disease)    takes Nexium bid, hx erosive esophagitis   Headache(784.0)    occasionally;r/t sinus    History of colon polyps    HTN (hypertension)    Hx of amputation of lesser toe (HCC)    sees podiatrist   Hyperlipemia    IBS (irritable bowel syndrome)    Insomnia    takes Elavil nightly   Joint pain    Neuropathy    Neuropathy    Osteomyelitis ([place])    Pneumonia    89/2/22- patient denies   PONV (postoperative nausea and vomiting)    medication did not help with surgery on 03/28/20   Seasonal allergies    takes Zyrtec daily   Sinus tachycardia     Family History  Problem Relation Age of Onset   Lung cancer Mother 64       smoked heavily   Emphysema Mother    Hypertension Father    Hyperlipidemia Father    Diabetes Father    Coronary artery disease Father    Dementia Father    COPD Father        smoked   Stomach cancer Paternal Aunt    Brain cancer Paternal Uncle    Irritable bowel syndrome Other        Several family members on fathers side    Diabetes Other    Stomach cancer  Maternal Aunt    Lung cancer Paternal Uncle  Heart disease Paternal Uncle    Colon cancer Neg Hx     Past Surgical History:  Procedure Laterality Date   AMPUTATION  12/28/2011   Procedure: AMPUTATION DIGIT;  Surgeon: Newt Minion, MD;  Location: [place];  Service: Orthopedics;  Laterality: Right;  Right Foot 2nd Toe Amputation at MTP (metatarsophalangeal joint)   AMPUTATION Right 04/25/2012   Procedure: Right Foot 3rd Toe Amputation;  Surgeon: Newt Minion, MD;  Location: [place];  Service: Orthopedics;  Laterality: Right;  Right Foot Third Toe Amputation    AMPUTATION Right 07/27/2012   Procedure: Right 4th Toe Amputation at Metatarsophalangeal;  Surgeon: Newt Minion, MD;  Location: [place];  Service: Orthopedics;  Laterality: Right;  Right 4th Toe Amputation at Metatarsophalangeal   AMPUTATION Left 07/15/2016   Procedure: Left 2nd Ray Amputation;  Surgeon: Newt Minion, MD;  Location: [facility];  Service: Orthopedics;  Laterality: Left;   AMPUTATION Left 11/18/2016   Procedure: Left 3rd and 4th Ray Amputation;  Surgeon: Newt Minion, MD;  Location: [facility];  Service: Orthopedics;  Laterality: Left;   AMPUTATION Right 03/29/2017   Procedure: RIGHT FOOT 3RD AND 4TH RAY AMPUTATION;  Surgeon: Newt Minion, MD;  Location: [facility];  Service: Orthopedics;  Laterality: Right;   AMPUTATION Left 08/12/2020   Procedure: LEFT TRANSMETATARSAL AMPUTATION;  Surgeon: Newt Minion, MD;  Location: [place];  Service: Orthopedics;  Laterality: Left;   APPLICATION OF WOUND VAC Left 08/12/2020   Procedure: APPLICATION OF WOUND VAC;  Surgeon: Newt Minion, MD;  Location: [place] [facility];  Service: Orthopedics;  Laterality: Left;   COLONOSCOPY     LAPAROSCOPIC APPENDECTOMY  01/05/2011   Procedure: APPENDECTOMY LAPAROSCOPIC;  Surgeon: Pedro Earls, MD;  Location: [facility];  Service: General;  Laterality: N/A;   OOPHORECTOMY  2001   ROTATOR CUFF REPAIR Right    x 2   TUBAL LIGATION     VAGINAL HYSTERECTOMY  2001   Social  History   Occupational History   Occupation: Retired   Tobacco Use   Smoking status: Never   Smokeless tobacco: Never  Vaping Use   Vaping Use: Never used  Substance and Sexual Activity   Alcohol use: Never   Drug use: Never   Sexual activity: Yes    Birth control/protection: Surgical

## 2021-06-02 ENCOUNTER — Inpatient Hospital Stay: Payer: Medicare Other | Attending: Oncology

## 2021-06-02 DIAGNOSIS — D509 Iron deficiency anemia, unspecified: Secondary | ICD-10-CM | POA: Diagnosis not present

## 2021-06-02 LAB — CBC WITH DIFFERENTIAL (CANCER CENTER ONLY)
Abs Immature Granulocytes: 0.04 10*3/uL (ref 0.00–0.07)
Basophils Absolute: 0.1 10*3/uL (ref 0.0–0.1)
Basophils Relative: 1 %
Eosinophils Absolute: 0.2 10*3/uL (ref 0.0–0.5)
Eosinophils Relative: 2 %
HCT: 36.9 % (ref 36.0–46.0)
Hemoglobin: 12.2 g/dL (ref 12.0–15.0)
Immature Granulocytes: 0 %
Lymphocytes Relative: 23 %
Lymphs Abs: 2.5 10*3/uL (ref 0.7–4.0)
MCH: 28.9 pg (ref 26.0–34.0)
MCHC: 33.1 g/dL (ref 30.0–36.0)
MCV: 87.4 fL (ref 80.0–100.0)
Monocytes Absolute: 0.6 10*3/uL (ref 0.1–1.0)
Monocytes Relative: 5 %
Neutro Abs: 7.4 10*3/uL (ref 1.7–7.7)
Neutrophils Relative %: 69 %
Platelet Count: 361 10*3/uL (ref 150–400)
RBC: 4.22 MIL/uL (ref 3.87–5.11)
RDW: 14.3 % (ref 11.5–15.5)
WBC Count: 10.8 10*3/uL — ABNORMAL HIGH (ref 4.0–10.5)
nRBC: 0 % (ref 0.0–0.2)

## 2021-06-02 LAB — FERRITIN: Ferritin: 224 ng/mL (ref 11–307)

## 2021-06-24 ENCOUNTER — Ambulatory Visit (INDEPENDENT_AMBULATORY_CARE_PROVIDER_SITE_OTHER): Payer: Medicare Other | Admitting: Orthopedic Surgery

## 2021-06-24 DIAGNOSIS — L97511 Non-pressure chronic ulcer of other part of right foot limited to breakdown of skin: Secondary | ICD-10-CM | POA: Diagnosis not present

## 2021-06-27 ENCOUNTER — Encounter: Payer: Self-pay | Admitting: Orthopedic Surgery

## 2021-06-27 NOTE — Progress Notes (Signed)
Office Visit Note   [name]           Date of Birth: 1945/11/01           MRN: [id number] Visit Date: 06/24/2021              Requested by: Vivi Barrack, [place],  [place] [address] PCP: Vivi Barrack, MD  Chief Complaint  Patient presents with   Left Foot - Follow-up   Right Foot - Follow-up      HPI: Patient is a 76 year old woman who presents in follow-up with a Wagner grade 1 ulcer plantar aspect of the right foot status post right transmetatarsal amputation.  Patient also complains of callus on both feet with some drainage from the right foot.  Assessment & Plan: Visit Diagnoses:  1. Non-pressure chronic ulcer of other part of right foot limited to breakdown of skin ([place])     Plan: Ulcer was debrided of skin and soft tissue on the right foot.  Follow-Up Instructions: Return in about 4 weeks (around 07/22/2021).   Ortho Exam  Patient is alert, oriented, no adenopathy, well-dressed, normal affect, normal respiratory effort. Examination patient has a Wagner grade 1 ulcer beneath the fourth metatarsal head of the right foot.  The ulcer is 1 cm diameter after informed consent a 10 blade knife was used to debride the skin and soft tissue back to healthy viable granulation tissue.  There is no exposed bone or tendon no redness no drainage the ulcer is 2 cm in diameter and 3 mm deep after debridement.  Imaging: No results found. No images are attached to the encounter.  Labs: Lab Results  Component Value Date   HGBA1C 7.8 (A) 05/06/2021   HGBA1C 8.1 (H) 08/12/2020   HGBA1C 8.4 (H) 05/29/2020   ESRSEDRATE 51 (H) 12/24/2019   ESRSEDRATE 36 (H) 02/24/2017   CRP 13.2 (H) 12/24/2019   CRP 1.2 (H) 02/24/2017   LABURIC 5.5 09/01/2015   REPTSTATUS 06/03/2020 FINAL 05/29/2020   CULT  05/29/2020    NO GROWTH 5 DAYS Performed at [facility], [place] [address] [place], [place] [address]    LABORGA GROUP B STREP (S.AGALACTIAE) ISOLATED  12/26/2014     Lab Results  Component Value Date   ALBUMIN 3.8 09/08/2020   ALBUMIN 3.4 (L) 05/29/2020   ALBUMIN 3.6 12/26/2019   PREALBUMIN 19.4 03/05/2012    No results found for: "MG" No results found for: "VD25OH"  Lab Results  Component Value Date   PREALBUMIN 19.4 03/05/2012      Latest Ref Rng & Units 06/02/2021    7:55 AM 04/01/2021    7:45 AM 02/04/2021    7:51 AM  CBC EXTENDED  WBC 4.0 - 10.5 K/uL 10.8  11.5  12.8   RBC 3.87 - 5.11 MIL/uL 4.22  4.25  4.21   Hemoglobin 12.0 - 15.0 g/dL 12.2  11.1  10.9   HCT 36.0 - 46.0 % 36.9  35.7  34.7   Platelets 150 - 400 K/uL 361  400  410   NEUT# 1.7 - 7.7 K/uL 7.4  7.1  9.1   Lymph# 0.7 - 4.0 K/uL 2.5  3.4  2.7      There is no height or weight on file to calculate BMI.  Orders:  No orders of the defined types were placed in this encounter.  No orders of the defined types were placed in this encounter.    Procedures:  No procedures performed  Clinical Data: No additional findings.  ROS:  All other systems negative, except as noted in the HPI. Review of Systems  Objective: Vital Signs: There were no vitals taken for this visit.  Specialty Comments:  No specialty comments available.  PMFS History: Patient Active Problem List   Diagnosis Date Noted   Coccyx pain 12/18/2020   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[place]) 10/28/2020   Memory loss 11/21/2019   Mild nonproliferative diabetic retinopathy of both eyes ([place]) 10/23/2019   Nuclear sclerotic cataract of both eyes 10/23/2019   Posterior vitreous detachment of right eye 10/23/2019   Retinal hemorrhage of left eye 10/23/2019   Impingement syndrome of right shoulder 07/06/2017   Iron deficiency anemia 05/11/2017   Morbid obesity ([place]) 04/24/2017   Anemia 02/24/2017   Baker's cyst 07/10/2016   Onychomycosis 12/07/2015   Upper airway cough syndrome 03/05/2014   Hypertension associated with diabetes ([place]) 12/05/2006   T2DM (type 2  diabetes mellitus) ([place]) 10/12/2006   Dyslipidemia associated with type 2 diabetes mellitus ([place]) 10/12/2006   Allergic rhinitis 10/12/2006   GERD - Followed by Dr. Fuller Plan 10/12/2006   IRRITABLE BOWEL SYNDROME - followed by Dr. Fuller Plan 10/12/2006   Peripheral neuropathy 10/11/2006   ALOPECIA NEC 10/11/2006   Past Medical History:  Diagnosis Date   Alopecia    Anemia, mild    Arthritis    Chronic cough    sees pulmonologist   Chronic pain    chest wall and abd - s/p extensive eval   Diabetes mellitus with neuropathy (HCC)    sees endocrine   Diverticulosis    Dyspnea    Fatty liver    GERD (gastroesophageal reflux disease)    takes Nexium bid, hx erosive esophagitis   Headache(784.0)    occasionally;r/t sinus    History of colon polyps    HTN (hypertension)    Hx of amputation of lesser toe (HCC)    sees podiatrist   Hyperlipemia    IBS (irritable bowel syndrome)    Insomnia    takes Elavil nightly   Joint pain    Neuropathy    Neuropathy    Osteomyelitis ([place])    Pneumonia    89/2/22- patient denies   PONV (postoperative nausea and vomiting)    medication did not help with surgery on 03/28/20   Seasonal allergies    takes Zyrtec daily   Sinus tachycardia     Family History  Problem Relation Age of Onset   Lung cancer Mother 23       smoked heavily   Emphysema Mother    Hypertension Father    Hyperlipidemia Father    Diabetes Father    Coronary artery disease Father    Dementia Father    COPD Father        smoked   Stomach cancer Paternal Aunt    Brain cancer Paternal Uncle    Irritable bowel syndrome Other        Several family members on fathers side    Diabetes Other    Stomach cancer Maternal Aunt    Lung cancer Paternal Uncle    Heart disease Paternal Uncle    Colon cancer Neg Hx     Past Surgical History:  Procedure Laterality Date   AMPUTATION  12/28/2011   Procedure: AMPUTATION DIGIT;  Surgeon: Newt Minion, MD;  Location: [place];  Service:  Orthopedics;  Laterality: Right;  Right Foot 2nd Toe Amputation at MTP (  metatarsophalangeal joint)   AMPUTATION Right 04/25/2012   Procedure: Right Foot 3rd Toe Amputation;  Surgeon: Newt Minion, MD;  Location: [place];  Service: Orthopedics;  Laterality: Right;  Right Foot Third Toe Amputation    AMPUTATION Right 07/27/2012   Procedure: Right 4th Toe Amputation at Metatarsophalangeal;  Surgeon: Newt Minion, MD;  Location: [facility];  Service: Orthopedics;  Laterality: Right;  Right 4th Toe Amputation at Metatarsophalangeal   AMPUTATION Left 07/15/2016   Procedure: Left 2nd Ray Amputation;  Surgeon: Newt Minion, MD;  Location: [facility];  Service: Orthopedics;  Laterality: Left;   AMPUTATION Left 11/18/2016   Procedure: Left 3rd and 4th Ray Amputation;  Surgeon: Newt Minion, MD;  Location: [facility];  Service: Orthopedics;  Laterality: Left;   AMPUTATION Right 03/29/2017   Procedure: RIGHT FOOT 3RD AND 4TH RAY AMPUTATION;  Surgeon: Newt Minion, MD;  Location: [facility];  Service: Orthopedics;  Laterality: Right;   AMPUTATION Left 08/12/2020   Procedure: LEFT TRANSMETATARSAL AMPUTATION;  Surgeon: Newt Minion, MD;  Location: [facility];  Service: Orthopedics;  Laterality: Left;   APPLICATION OF WOUND VAC Left 08/12/2020   Procedure: APPLICATION OF WOUND VAC;  Surgeon: Newt Minion, MD;  Location: [place];  Service: Orthopedics;  Laterality: Left;   COLONOSCOPY     LAPAROSCOPIC APPENDECTOMY  01/05/2011   Procedure: APPENDECTOMY LAPAROSCOPIC;  Surgeon: Pedro Earls, MD;  [facility];  Service: General;  Laterality: N/A;   OOPHORECTOMY  2001   ROTATOR CUFF REPAIR Right    x 2   TUBAL LIGATION     VAGINAL HYSTERECTOMY  2001   Social History   Occupational History   Occupation: Retired   Tobacco Use   Smoking status: Never   Smokeless tobacco: Never  Vaping Use   Vaping Use: Never used  Substance and Sexual Activity   Alcohol use: Never   Drug use: Never   Sexual activity: Yes    Birth  control/protection: Surgical

## 2021-07-09 ENCOUNTER — Telehealth: Payer: Self-pay | Admitting: Family

## 2021-07-09 ENCOUNTER — Ambulatory Visit (INDEPENDENT_AMBULATORY_CARE_PROVIDER_SITE_OTHER): Payer: Medicare Other | Admitting: Surgical

## 2021-07-09 DIAGNOSIS — L97511 Non-pressure chronic ulcer of other part of right foot limited to breakdown of skin: Secondary | ICD-10-CM

## 2021-07-09 NOTE — Telephone Encounter (Signed)
Pt called requesting a call back from [place]. F. Pt states she thinks she has a medical issue going on and has questions. Pt phone number is [phone].

## 2021-07-09 NOTE — Telephone Encounter (Signed)
Per [facility] see Dr Sharol Given Thursday 07/15/21. Please call pt at [phone].

## 2021-07-09 NOTE — Telephone Encounter (Signed)
Tried calling was unable to reach patient.

## 2021-07-10 ENCOUNTER — Encounter: Payer: Self-pay | Admitting: Surgical

## 2021-07-10 NOTE — Progress Notes (Signed)
Office Visit Note   [name]           Date of Birth: 06-Aug-1945           MRN: [id number] Visit Date: 07/09/2021 Requested by: Vivi Barrack, [place] [place],  [place] [address] PCP: Vivi Barrack, MD  Subjective: Chief Complaint  Patient presents with   Right Foot - Pain    HPI: Beth Holden is a 76 y.o. female who presents to the office complaining of right foot redness.  She states that she has a history of right foot third and fourth ray amputation by Dr. Sharol Given on 03/29/2017.  She states that she has noticed a little bit of increased redness around the surgery site in the past 1 to 2 days.  She denies any fevers, chills, drainage, increased pain.  Does not really bother her to ambulate.  Not waking with pain.  No sweats at night..                ROS: All systems reviewed are negative as they relate to the chief complaint within the history of present illness.  Patient denies fevers or chills.  Assessment & Plan: Visit Diagnoses:  1. Non-pressure chronic ulcer of other part of right foot limited to breakdown of skin [facility])     Plan: Patient is a 76 year old female who presents for evaluation of right foot redness.  Has history of right foot third and fourth ray amputation by Dr. Sharol Given in March 2019.  Has done well from the surgery and comes in for occasional debridement of ulcer.  Last appointment was about 2 weeks ago.  No significant complaints concerning for infection aside from the redness which completely resolves with elevation of the extremity.  No significant concern for infectious etiology today.  Recommended she return next Thursday for evaluation with Dr. Sharol Given after discussing with him.  Recommended she call the office on-call number if she starts to notice any concerning signs or symptoms such as continued or worsening redness, drainage, fevers, chills, worsening pain.  Patient agreed with plan.  Follow-Up Instructions: No follow-ups on file.    Orders:  No orders of the defined types were placed in this encounter.  No orders of the defined types were placed in this encounter.     Procedures: No procedures performed   Clinical Data: No additional findings.  Objective: Vital Signs: There were no vitals taken for this visit.  Physical Exam:  Constitutional: Patient appears well-developed HEENT:  Head: Normocephalic Eyes:EOM are normal Neck: Normal range of motion Cardiovascular: Normal rate Pulmonary/chest: Effort normal Neurologic: Patient is alert Skin: Skin is warm Psychiatric: Patient has normal mood and affect  Ortho Exam: Ortho exam demonstrates right foot is warm and well-perfused.  There is a little bit of redness noted between the second and fifth toes at the site of prior ray amputation.  This redness resolves near completely with elevation of the extremity while she is supine.  There is no tenderness or significant warmth around the site.  She does have a small ulcer with black coloring on the plantar aspect of her foot just proximal to the surgery site with a small blister on the plantar aspect of the foot as well that contains some fluid but there is no expressible fluid.  Does not feel warm and there is no tenderness over this area.  Specialty Comments:  No specialty comments available.  Imaging: No results found.   [place]  History: Patient Active Problem List   Diagnosis Date Noted   Coccyx pain 12/18/2020   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[place]) 10/28/2020   Memory loss 11/21/2019   Mild nonproliferative diabetic retinopathy of both eyes ([place]) 10/23/2019   Nuclear sclerotic cataract of both eyes 10/23/2019   Posterior vitreous detachment of right eye 10/23/2019   Retinal hemorrhage of left eye 10/23/2019   Impingement syndrome of right shoulder 07/06/2017   Iron deficiency anemia 05/11/2017   Morbid obesity ([place]) 04/24/2017   Anemia 02/24/2017   Baker's cyst 07/10/2016    Onychomycosis 12/07/2015   Upper airway cough syndrome 03/05/2014   Hypertension associated with diabetes ([place]) 12/05/2006   T2DM (type 2 diabetes mellitus) ([place]) 10/12/2006   Dyslipidemia associated with type 2 diabetes mellitus ([place]) 10/12/2006   Allergic rhinitis 10/12/2006   GERD - Followed by Dr. Fuller Plan 10/12/2006   IRRITABLE BOWEL SYNDROME - followed by Dr. Fuller Plan 10/12/2006   Peripheral neuropathy 10/11/2006   ALOPECIA NEC 10/11/2006   Past Medical History:  Diagnosis Date   Alopecia    Anemia, mild    Arthritis    Chronic cough    sees pulmonologist   Chronic pain    chest wall and abd - s/p extensive eval   Diabetes mellitus with neuropathy (HCC)    sees endocrine   Diverticulosis    Dyspnea    Fatty liver    GERD (gastroesophageal reflux disease)    takes Nexium bid, hx erosive esophagitis   Headache(784.0)    occasionally;r/t sinus    History of colon polyps    HTN (hypertension)    Hx of amputation of lesser toe (HCC)    sees podiatrist   Hyperlipemia    IBS (irritable bowel syndrome)    Insomnia    takes Elavil nightly   Joint pain    Neuropathy    Neuropathy    Osteomyelitis ([place])    Pneumonia    89/2/22- patient denies   PONV (postoperative nausea and vomiting)    medication did not help with surgery on 03/28/20   Seasonal allergies    takes Zyrtec daily   Sinus tachycardia     Family History  Problem Relation Age of Onset   Lung cancer Mother 23       smoked heavily   Emphysema Mother    Hypertension Father    Hyperlipidemia Father    Diabetes Father    Coronary artery disease Father    Dementia Father    COPD Father        smoked   Stomach cancer Paternal Aunt    Brain cancer Paternal Uncle    Irritable bowel syndrome Other        Several family members on fathers side    Diabetes Other    Stomach cancer Maternal Aunt    Lung cancer Paternal Uncle    Heart disease Paternal Uncle    Colon cancer Neg Hx     Past Surgical History:   Procedure Laterality Date   AMPUTATION  12/28/2011   Procedure: AMPUTATION DIGIT;  Surgeon: Newt Minion, MD;  Location: [place];  Service: Orthopedics;  Laterality: Right;  Right Foot 2nd Toe Amputation at MTP (metatarsophalangeal joint)   AMPUTATION Right 04/25/2012   Procedure: Right Foot 3rd Toe Amputation;  Surgeon: Newt Minion, MD;  Location: [place];  Service: Orthopedics;  Laterality: Right;  Right Foot Third Toe Amputation    AMPUTATION Right 07/27/2012   Procedure: Right  4th Toe Amputation at Metatarsophalangeal;  Surgeon: Newt Minion, MD;  Location: [place];  Service: Orthopedics;  Laterality: Right;  Right 4th Toe Amputation at Metatarsophalangeal   AMPUTATION Left 07/15/2016   Procedure: Left 2nd Ray Amputation;  Surgeon: Newt Minion, MD;  Location: [place];  Service: Orthopedics;  Laterality: Left;   AMPUTATION Left 11/18/2016   Procedure: Left 3rd and 4th Ray Amputation;  Surgeon: Newt Minion, MD;  Location: [place];  Service: Orthopedics;  Laterality: Left;   AMPUTATION Right 03/29/2017   Procedure: RIGHT FOOT 3RD AND 4TH RAY AMPUTATION;  Surgeon: Newt Minion, MD;  Location: [place];  Service: Orthopedics;  Laterality: Right;   AMPUTATION Left 08/12/2020   Procedure: LEFT TRANSMETATARSAL AMPUTATION;  Surgeon: Newt Minion, MD;  Location: [place];  Service: Orthopedics;  Laterality: Left;   APPLICATION OF WOUND VAC Left 08/12/2020   Procedure: APPLICATION OF WOUND VAC;  Surgeon: Newt Minion, MD;  Location: [place];  Service: Orthopedics;  Laterality: Left;   COLONOSCOPY     LAPAROSCOPIC APPENDECTOMY  01/05/2011   Procedure: APPENDECTOMY LAPAROSCOPIC;  Surgeon: Pedro Earls, MD;  [facility];  Service: General;  Laterality: N/A;   OOPHORECTOMY  2001   ROTATOR CUFF REPAIR Right    x 2   TUBAL LIGATION     VAGINAL HYSTERECTOMY  2001   Social History   Occupational History   Occupation: Retired   Tobacco Use   Smoking status: Never   Smokeless tobacco: Never  Vaping  Use   Vaping Use: Never used  Substance and Sexual Activity   Alcohol use: Never   Drug use: Never   Sexual activity: Yes    Birth control/protection: Surgical

## 2021-07-12 ENCOUNTER — Ambulatory Visit (INDEPENDENT_AMBULATORY_CARE_PROVIDER_SITE_OTHER): Payer: Medicare Other | Admitting: Orthopedic Surgery

## 2021-07-12 ENCOUNTER — Encounter: Payer: Self-pay | Admitting: Orthopedic Surgery

## 2021-07-12 DIAGNOSIS — L97511 Non-pressure chronic ulcer of other part of right foot limited to breakdown of skin: Secondary | ICD-10-CM

## 2021-07-12 NOTE — Telephone Encounter (Signed)
Pt has an appt today.  

## 2021-07-12 NOTE — Telephone Encounter (Signed)
Called pt and made an appt to come in today at 1pm.

## 2021-07-12 NOTE — Progress Notes (Signed)
Office Visit Note   [name]           Date of Birth: 06/26/1945           MRN: [id number] Visit Date: 07/12/2021              Requested by: Vivi Barrack, MD [address] [place],  [place] [address] PCP: Vivi Barrack, MD  Chief Complaint  Patient presents with   Right Foot - Wound Check      HPI: Patient is a 76 year old woman who presents with an acute blister plantar aspect of the right foot.  Assessment & Plan: Visit Diagnoses: No diagnosis found.  Plan: Ulcer was debrided hematoma was decompressed.  There is good epithelization start Dial soap cleansing 4 x 4 gauze and a Band-Aid.  Minimize weightbearing.  Follow-Up Instructions: Return in about 2 weeks (around 07/26/2021).   Ortho Exam  Patient is alert, oriented, no adenopathy, well-dressed, normal affect, normal respiratory effort. Examination patient has a palpable pulse she has some callus on the left transmetatarsal amputation that was pared.  Right foot she has a new large blood blister beneath the midfoot.  After informed consent a 10 blade knife was used debride the skin and soft tissue back to healthy viable granulation tissue this was touched with silver nitrate.  There is no tunneling there is intact epithelialization over the distal aspect of the blister.  After debridement the wound is 2 x 3 cm and 2 mm deep.  Imaging: No results found. No images are attached to the encounter.  Labs: Lab Results  Component Value Date   HGBA1C 7.8 (A) 05/06/2021   HGBA1C 8.1 (H) 08/12/2020   HGBA1C 8.4 (H) 05/29/2020   ESRSEDRATE 51 (H) 12/24/2019   ESRSEDRATE 36 (H) 02/24/2017   CRP 13.2 (H) 12/24/2019   CRP 1.2 (H) 02/24/2017   LABURIC 5.5 09/01/2015   REPTSTATUS 06/03/2020 FINAL 05/29/2020   CULT  05/29/2020    NO GROWTH 5 DAYS Performed at [facility], [place] [address], [place], [place] [address]    LABORGA GROUP B STREP (S.AGALACTIAE) ISOLATED 12/26/2014     Lab Results   Component Value Date   ALBUMIN 3.8 09/08/2020   ALBUMIN 3.4 (L) 05/29/2020   ALBUMIN 3.6 12/26/2019   PREALBUMIN 19.4 03/05/2012    No results found for: "MG" No results found for: "VD25OH"  Lab Results  Component Value Date   PREALBUMIN 19.4 03/05/2012      Latest Ref Rng & Units 06/02/2021    7:55 AM 04/01/2021    7:45 AM 02/04/2021    7:51 AM  CBC EXTENDED  WBC 4.0 - 10.5 K/uL 10.8  11.5  12.8   RBC 3.87 - 5.11 MIL/uL 4.22  4.25  4.21   Hemoglobin 12.0 - 15.0 g/dL 12.2  11.1  10.9   HCT 36.0 - 46.0 % 36.9  35.7  34.7   Platelets 150 - 400 K/uL 361  400  410   NEUT# 1.7 - 7.7 K/uL 7.4  7.1  9.1   Lymph# 0.7 - 4.0 K/uL 2.5  3.4  2.7      There is no height or weight on file to calculate BMI.  Orders:  No orders of the defined types were placed in this encounter.  No orders of the defined types were placed in this encounter.    Procedures: No procedures performed  Clinical Data: No additional findings.  ROS:  All other systems negative,  except as noted in the HPI. Review of Systems  Objective: Vital Signs: There were no vitals taken for this visit.  Specialty Comments:  No specialty comments available.  PMFS History: Patient Active Problem List   Diagnosis Date Noted   Coccyx pain 12/18/2020   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[place]) 10/28/2020   Memory loss 11/21/2019   Mild nonproliferative diabetic retinopathy of both eyes ([place]) 10/23/2019   Nuclear sclerotic cataract of both eyes 10/23/2019   Posterior vitreous detachment of right eye 10/23/2019   Retinal hemorrhage of left eye 10/23/2019   Impingement syndrome of right shoulder 07/06/2017   Iron deficiency anemia 05/11/2017   Morbid obesity ([place]) 04/24/2017   Anemia 02/24/2017   Baker's cyst 07/10/2016   Onychomycosis 12/07/2015   Upper airway cough syndrome 03/05/2014   Hypertension associated with diabetes ([place]) 12/05/2006   T2DM (type 2 diabetes mellitus) ([place]) 10/12/2006    Dyslipidemia associated with type 2 diabetes mellitus ([place]) 10/12/2006   Allergic rhinitis 10/12/2006   GERD - Followed by Dr. Fuller Plan 10/12/2006   IRRITABLE BOWEL SYNDROME - followed by Dr. Fuller Plan 10/12/2006   Peripheral neuropathy 10/11/2006   ALOPECIA NEC 10/11/2006   Past Medical History:  Diagnosis Date   Alopecia    Anemia, mild    Arthritis    Chronic cough    sees pulmonologist   Chronic pain    chest wall and abd - s/p extensive eval   Diabetes mellitus with neuropathy (HCC)    sees endocrine   Diverticulosis    Dyspnea    Fatty liver    GERD (gastroesophageal reflux disease)    takes Nexium bid, hx erosive esophagitis   Headache(784.0)    occasionally;r/t sinus    History of colon polyps    HTN (hypertension)    Hx of amputation of lesser toe (HCC)    sees podiatrist   Hyperlipemia    IBS (irritable bowel syndrome)    Insomnia    takes Elavil nightly   Joint pain    Neuropathy    Neuropathy    Osteomyelitis ([place])    Pneumonia    89/2/22- patient denies   PONV (postoperative nausea and vomiting)    medication did not help with surgery on 03/28/20   Seasonal allergies    takes Zyrtec daily   Sinus tachycardia     Family History  Problem Relation Age of Onset   Lung cancer Mother 62       smoked heavily   Emphysema Mother    Hypertension Father    Hyperlipidemia Father    Diabetes Father    Coronary artery disease Father    Dementia Father    COPD Father        smoked   Stomach cancer Paternal Aunt    Brain cancer Paternal Uncle    Irritable bowel syndrome Other        Several family members on fathers side    Diabetes Other    Stomach cancer Maternal Aunt    Lung cancer Paternal Uncle    Heart disease Paternal Uncle    Colon cancer Neg Hx     Past Surgical History:  Procedure Laterality Date   AMPUTATION  12/28/2011   Procedure: AMPUTATION DIGIT;  Surgeon: Newt Minion, MD;  Location: [place];  Service: Orthopedics;  Laterality: Right;   Right Foot 2nd Toe Amputation at MTP (metatarsophalangeal joint)   AMPUTATION Right 04/25/2012   Procedure: Right Foot 3rd Toe Amputation;  Surgeon: Newt Minion, MD;  Location: [place];  Service: Orthopedics;  Laterality: Right;  Right Foot Third Toe Amputation    AMPUTATION Right 07/27/2012   Procedure: Right 4th Toe Amputation at Metatarsophalangeal;  Surgeon: Newt Minion, MD;  Location: [place];  Service: Orthopedics;  Laterality: Right;  Right 4th Toe Amputation at Metatarsophalangeal   AMPUTATION Left 07/15/2016   Procedure: Left 2nd Ray Amputation;  Surgeon: Newt Minion, MD;  Location: [place];  Service: Orthopedics;  Laterality: Left;   AMPUTATION Left 11/18/2016   Procedure: Left 3rd and 4th Ray Amputation;  Surgeon: Newt Minion, MD;  Location: [place];  Service: Orthopedics;  Laterality: Left;   AMPUTATION Right 03/29/2017   Procedure: RIGHT FOOT 3RD AND 4TH RAY AMPUTATION;  Surgeon: Newt Minion, MD;  Location: [place];  Service: Orthopedics;  Laterality: Right;   AMPUTATION Left 08/12/2020   Procedure: LEFT TRANSMETATARSAL AMPUTATION;  Surgeon: Newt Minion, MD;  Location: [place];  Service: Orthopedics;  Laterality: Left;   APPLICATION OF WOUND VAC Left 08/12/2020   Procedure: APPLICATION OF WOUND VAC;  Surgeon: Newt Minion, MD;  Location: [place];  Service: Orthopedics;  Laterality: Left;   COLONOSCOPY     LAPAROSCOPIC APPENDECTOMY  01/05/2011   Procedure: APPENDECTOMY LAPAROSCOPIC;  Surgeon: Pedro Earls, MD;  [facility];  Service: General;  Laterality: N/A;   OOPHORECTOMY  2001   ROTATOR CUFF REPAIR Right    x 2   TUBAL LIGATION     VAGINAL HYSTERECTOMY  2001   Social History   Occupational History   Occupation: Retired   Tobacco Use   Smoking status: Never   Smokeless tobacco: Never  Vaping Use   Vaping Use: Never used  Substance and Sexual Activity   Alcohol use: Never   Drug use: Never   Sexual activity: Yes    Birth control/protection: Surgical

## 2021-07-29 ENCOUNTER — Encounter: Payer: Self-pay | Admitting: Orthopedic Surgery

## 2021-07-29 ENCOUNTER — Ambulatory Visit (INDEPENDENT_AMBULATORY_CARE_PROVIDER_SITE_OTHER): Payer: Medicare Other | Admitting: Orthopedic Surgery

## 2021-07-29 DIAGNOSIS — L97511 Non-pressure chronic ulcer of other part of right foot limited to breakdown of skin: Secondary | ICD-10-CM

## 2021-07-29 DIAGNOSIS — B351 Tinea unguium: Secondary | ICD-10-CM

## 2021-07-29 NOTE — Progress Notes (Signed)
Office Visit Note   [name]           Date of Birth: 10/17/1945           MRN: [id number] Visit Date: 07/29/2021              Requested by: Vivi Barrack, MD [address] [place],  [place] [address] PCP: Vivi Barrack, MD  Chief Complaint  Patient presents with   Right Foot - Follow-up   Left Foot - Follow-up      HPI: Patient is a 76 year old woman who presents in follow-up for ulcer plantar aspect right foot.  Patient has been using Dial soap cleansing dry dressing changes she has been minimizing her weightbearing.  Assessment & Plan: Visit Diagnoses:  1. Non-pressure chronic ulcer of other part of right foot limited to breakdown of skin ([place])   2. Onychomycosis     Plan: Patient is showing excellent improvement.  Discussed that she can increase her activities as tolerated.  Follow-Up Instructions: Return in about 4 weeks (around 08/26/2021).   Ortho Exam  Patient is alert, oriented, no adenopathy, well-dressed, normal affect, normal respiratory effort. Examination patient has thickened discolored onychomycotic nails x2 in the right foot these were trimmed x2 without complications.  Left transmetatarsal amputation has no ulcers redness swelling or drainage.  Examination of the right foot the previously debrided ulcer is showing improvement.  After informed consent a 10 blade knife was used to debride the skin and soft tissue back to healthy viable tissue.  After debridement the wound areas 2 x 1 cm 1 mm deep.  A Band-Aid was applied there is no tunneling no exposed bone or tendon.  Imaging: No results found. No images are attached to the encounter.  Labs: Lab Results  Component Value Date   HGBA1C 7.8 (A) 05/06/2021   HGBA1C 8.1 (H) 08/12/2020   HGBA1C 8.4 (H) 05/29/2020   ESRSEDRATE 51 (H) 12/24/2019   ESRSEDRATE 36 (H) 02/24/2017   CRP 13.2 (H) 12/24/2019   CRP 1.2 (H) 02/24/2017   LABURIC 5.5 09/01/2015   REPTSTATUS 06/03/2020 FINAL  05/29/2020   CULT  05/29/2020    NO GROWTH 5 DAYS Performed at [facility], [place] [address] [place], [place] [address]    LABORGA GROUP B STREP (S.AGALACTIAE) ISOLATED 12/26/2014     Lab Results  Component Value Date   ALBUMIN 3.8 09/08/2020   ALBUMIN 3.4 (L) 05/29/2020   ALBUMIN 3.6 12/26/2019   PREALBUMIN 19.4 03/05/2012    No results found for: "MG" No results found for: "VD25OH"  Lab Results  Component Value Date   PREALBUMIN 19.4 03/05/2012      Latest Ref Rng & Units 06/02/2021    7:55 AM 04/01/2021    7:45 AM 02/04/2021    7:51 AM  CBC EXTENDED  WBC 4.0 - 10.5 K/uL 10.8  11.5  12.8   RBC 3.87 - 5.11 MIL/uL 4.22  4.25  4.21   Hemoglobin 12.0 - 15.0 g/dL 12.2  11.1  10.9   HCT 36.0 - 46.0 % 36.9  35.7  34.7   Platelets 150 - 400 K/uL 361  400  410   NEUT# 1.7 - 7.7 K/uL 7.4  7.1  9.1   Lymph# 0.7 - 4.0 K/uL 2.5  3.4  2.7      There is no height or weight on file to calculate BMI.  Orders:  No orders of the defined types were placed in this  encounter.  No orders of the defined types were placed in this encounter.    Procedures: No procedures performed  Clinical Data: No additional findings.  ROS:  All other systems negative, except as noted in the HPI. Review of Systems  Objective: Vital Signs: There were no vitals taken for this visit.  Specialty Comments:  No specialty comments available.  PMFS History: Patient Active Problem List   Diagnosis Date Noted   Coccyx pain 12/18/2020   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[place]) 10/28/2020   Memory loss 11/21/2019   Mild nonproliferative diabetic retinopathy of both eyes ([place]) 10/23/2019   Nuclear sclerotic cataract of both eyes 10/23/2019   Posterior vitreous detachment of right eye 10/23/2019   Retinal hemorrhage of left eye 10/23/2019   Impingement syndrome of right shoulder 07/06/2017   Iron deficiency anemia 05/11/2017   Morbid obesity ([place]) 04/24/2017   Anemia  02/24/2017   Baker's cyst 07/10/2016   Onychomycosis 12/07/2015   Upper airway cough syndrome 03/05/2014   Hypertension associated with diabetes ([place]) 12/05/2006   T2DM (type 2 diabetes mellitus) ([place]) 10/12/2006   Dyslipidemia associated with type 2 diabetes mellitus ([place]) 10/12/2006   Allergic rhinitis 10/12/2006   GERD - Followed by Dr. Fuller Plan 10/12/2006   IRRITABLE BOWEL SYNDROME - followed by Dr. Fuller Plan 10/12/2006   Peripheral neuropathy 10/11/2006   ALOPECIA NEC 10/11/2006   Past Medical History:  Diagnosis Date   Alopecia    Anemia, mild    Arthritis    Chronic cough    sees pulmonologist   Chronic pain    chest wall and abd - s/p extensive eval   Diabetes mellitus with neuropathy (HCC)    sees endocrine   Diverticulosis    Dyspnea    Fatty liver    GERD (gastroesophageal reflux disease)    takes Nexium bid, hx erosive esophagitis   Headache(784.0)    occasionally;r/t sinus    History of colon polyps    HTN (hypertension)    Hx of amputation of lesser toe (HCC)    sees podiatrist   Hyperlipemia    IBS (irritable bowel syndrome)    Insomnia    takes Elavil nightly   Joint pain    Neuropathy    Neuropathy    Osteomyelitis ([place])    Pneumonia    89/2/22- patient denies   PONV (postoperative nausea and vomiting)    medication did not help with surgery on 03/28/20   Seasonal allergies    takes Zyrtec daily   Sinus tachycardia     Family History  Problem Relation Age of Onset   Lung cancer Mother 78       smoked heavily   Emphysema Mother    Hypertension Father    Hyperlipidemia Father    Diabetes Father    Coronary artery disease Father    Dementia Father    COPD Father        smoked   Stomach cancer Paternal Aunt    Brain cancer Paternal Uncle    Irritable bowel syndrome Other        Several family members on fathers side    Diabetes Other    Stomach cancer Maternal Aunt    Lung cancer Paternal Uncle    Heart disease Paternal Uncle    Colon cancer  Neg Hx     Past Surgical History:  Procedure Laterality Date   AMPUTATION  12/28/2011   Procedure: AMPUTATION DIGIT;  Surgeon: Newt Minion, MD;  Location: [place];  Service: Orthopedics;  Laterality: Right;  Right Foot 2nd Toe Amputation at MTP (metatarsophalangeal joint)   AMPUTATION Right 04/25/2012   Procedure: Right Foot 3rd Toe Amputation;  Surgeon: Newt Minion, MD;  Location: [place];  Service: Orthopedics;  Laterality: Right;  Right Foot Third Toe Amputation    AMPUTATION Right 07/27/2012   Procedure: Right 4th Toe Amputation at Metatarsophalangeal;  Surgeon: Newt Minion, MD;  Location: [place];  Service: Orthopedics;  Laterality: Right;  Right 4th Toe Amputation at Metatarsophalangeal   AMPUTATION Left 07/15/2016   Procedure: Left 2nd Ray Amputation;  Surgeon: Newt Minion, MD;  Location: [place];  Service: Orthopedics;  Laterality: Left;   AMPUTATION Left 11/18/2016   Procedure: Left 3rd and 4th Ray Amputation;  Surgeon: Newt Minion, MD;  Location: [place];  Service: Orthopedics;  Laterality: Left;   AMPUTATION Right 03/29/2017   Procedure: RIGHT FOOT 3RD AND 4TH RAY AMPUTATION;  Surgeon: Newt Minion, MD;  Location: [place];  Service: Orthopedics;  Laterality: Right;   AMPUTATION Left 08/12/2020   Procedure: LEFT TRANSMETATARSAL AMPUTATION;  Surgeon: Newt Minion, MD;  Location: [place];  Service: Orthopedics;  Laterality: Left;   APPLICATION OF WOUND VAC Left 08/12/2020   Procedure: APPLICATION OF WOUND VAC;  Surgeon: Newt Minion, MD;  Location: [place];  Service: Orthopedics;  Laterality: Left;   COLONOSCOPY     LAPAROSCOPIC APPENDECTOMY  01/05/2011   Procedure: APPENDECTOMY LAPAROSCOPIC;  Surgeon: Pedro Earls, MD;  [facility];  Service: General;  Laterality: N/A;   OOPHORECTOMY  2001   ROTATOR CUFF REPAIR Right    x 2   TUBAL LIGATION     VAGINAL HYSTERECTOMY  2001   Social History   Occupational History   Occupation: Retired   Tobacco Use   Smoking status: Never    Smokeless tobacco: Never  Vaping Use   Vaping Use: Never used  Substance and Sexual Activity   Alcohol use: Never   Drug use: Never   Sexual activity: Yes    Birth control/protection: Surgical

## 2021-08-05 ENCOUNTER — Inpatient Hospital Stay: Payer: Medicare Other

## 2021-08-05 ENCOUNTER — Inpatient Hospital Stay: Payer: Medicare Other | Attending: Oncology | Admitting: Nurse Practitioner

## 2021-08-05 ENCOUNTER — Inpatient Hospital Stay: Payer: Medicare Other | Admitting: Oncology

## 2021-08-05 ENCOUNTER — Encounter: Payer: Self-pay | Admitting: Nurse Practitioner

## 2021-08-05 VITALS — BP 142/59 | HR 69 | Temp 98.2°F | Resp 18 | Ht 66.0 in | Wt 226.0 lb

## 2021-08-05 DIAGNOSIS — D509 Iron deficiency anemia, unspecified: Secondary | ICD-10-CM | POA: Diagnosis not present

## 2021-08-05 DIAGNOSIS — D649 Anemia, unspecified: Secondary | ICD-10-CM | POA: Diagnosis not present

## 2021-08-05 DIAGNOSIS — I10 Essential (primary) hypertension: Secondary | ICD-10-CM | POA: Diagnosis not present

## 2021-08-05 DIAGNOSIS — E11621 Type 2 diabetes mellitus with foot ulcer: Secondary | ICD-10-CM | POA: Insufficient documentation

## 2021-08-05 LAB — CBC WITH DIFFERENTIAL (CANCER CENTER ONLY)
Abs Immature Granulocytes: 0.05 10*3/uL (ref 0.00–0.07)
Basophils Absolute: 0.1 10*3/uL (ref 0.0–0.1)
Basophils Relative: 0 %
Eosinophils Absolute: 0.2 10*3/uL (ref 0.0–0.5)
Eosinophils Relative: 2 %
HCT: 37.2 % (ref 36.0–46.0)
Hemoglobin: 12.5 g/dL (ref 12.0–15.0)
Immature Granulocytes: 0 %
Lymphocytes Relative: 23 %
Lymphs Abs: 2.7 10*3/uL (ref 0.7–4.0)
MCH: 29.8 pg (ref 26.0–34.0)
MCHC: 33.6 g/dL (ref 30.0–36.0)
MCV: 88.6 fL (ref 80.0–100.0)
Monocytes Absolute: 0.8 10*3/uL (ref 0.1–1.0)
Monocytes Relative: 7 %
Neutro Abs: 7.8 10*3/uL — ABNORMAL HIGH (ref 1.7–7.7)
Neutrophils Relative %: 68 %
Platelet Count: 360 10*3/uL (ref 150–400)
RBC: 4.2 MIL/uL (ref 3.87–5.11)
RDW: 12.4 % (ref 11.5–15.5)
WBC Count: 11.6 10*3/uL — ABNORMAL HIGH (ref 4.0–10.5)
nRBC: 0 % (ref 0.0–0.2)

## 2021-08-05 LAB — FERRITIN: Ferritin: 156 ng/mL (ref 11–307)

## 2021-08-05 NOTE — Progress Notes (Signed)
  [facility] OFFICE PROGRESS NOTE   Diagnosis: Anemia  INTERVAL HISTORY:   Beth Holden returns as scheduled.  She received Feraheme 510 mg IV 04/08/2021 and 04/15/2021.  Follow-up CBC and ferritin on 06/02/2021 showed correction of hemoglobin into the normal range and improvement in the ferritin to 224.  She noted no significant improvement in how she felt despite the improvement in her labs.  She has discontinued oral iron.  She is not aware of any bleeding.  She reports foot ulcers are improving and she is now able to increase her activity level.  Objective:  Vital signs in last 24 hours:  Blood pressure (!) 142/59, pulse 69, temperature 98.2 F (36.8 C), temperature source Oral, resp. rate 18, height '5\' 6"'$  (1.676 m), weight 226 lb (102.5 kg), SpO2 96 %.    HEENT: No thrush or ulcers. Resp: Lungs clear bilaterally. Cardio: Regular rate and rhythm. GI: Abdomen soft and nontender.  No hepatosplenomegaly. Vascular: No leg edema.  Lab Results:  Lab Results  Component Value Date   WBC 11.6 (H) 08/05/2021   HGB 12.5 08/05/2021   HCT 37.2 08/05/2021   MCV 88.6 08/05/2021   PLT 360 08/05/2021   NEUTROABS 7.8 (H) 08/05/2021    Imaging:  No results found.  Medications: I have reviewed the patient's current medications.  Assessment/Plan: Anemia secondary to iron deficiency, possible GI blood loss related to gastritis status post IV ferrous gluconate weekly x4 beginning 03/13/2017, single dose of IV ferrous gluconate 05/22/2017.  Hemoglobin/MCV corrected into normal range. Upper endoscopy 02/15/2017-diffuse moderate inflammation characterized by congestion, erythema, friability and granularity in the gastric body, posterior wall of the stomach and gastric antrum.  Biopsy GASTRIC ANTRAL MUCOSA WITH NON-SPECIFIC REACTIVE GASTROPATHY AND FEW SUBEPITHELIAL CRYSTALLINE IRON DEPOSITS, CONSISTENT WITH IRON PILL GASTROPATHY. GASTRIC OXYNTIC MUCOSA WITH PARIETAL CELL HYPERPLASIA AS CAN  BE SEEN IN HYPERGASTRINEMIC STATES SUCH AS PPI THERAPY. WARTHIN-STARRY STAIN IS NEGATIVE FOR HELICOBACTER PYLORI Colonoscopy 02/15/2017-6 mm polyp in the transverse colon (TUBULAR ADENOMA. NEGATIVE FOR HIGH GRADE DYSPLASIA OR MALIGNANCY) Diabetes Hypertension Mild neutrophilia- chronic, white count dating at least to 2012 on review of epic chart Small hemoglobin on repeat urinalyses-seen by urology.  Disposition: Beth Holden appears stable.  She received IV iron about 2 months ago.  The hemoglobin corrected into normal range.  Hemoglobin today is higher in the normal range.  We will follow-up on the ferritin level.  She will resume oral iron once daily.  She has stable mild neutrophilia.  Follow-up CBC/ferritin in 2 months.  Next office visit in 4 months.  She will contact the office in the interim with any problems.  We specifically discussed signs/symptoms suggestive of progressive anemia.  Patient seen with Dr. Benay Spice.   Ned Card ANP/GNP-BC   This was a shared visit with Ned Card.  Beth Holden has a normal hemoglobin today.  The plan is to follow the hemoglobin and ferritin level.  I was present for greater than 50% of today's visit.  I performed medical decision making.  Julieanne Manson, MD  08/05/2021  8:14 AM

## 2021-08-06 ENCOUNTER — Encounter: Payer: Self-pay | Admitting: Nurse Practitioner

## 2021-08-06 ENCOUNTER — Telehealth: Payer: Self-pay

## 2021-08-06 NOTE — Telephone Encounter (Signed)
Patient gave verbal understanding and had no further questions or concerns  

## 2021-08-06 NOTE — Telephone Encounter (Signed)
-----   Message from Owens Shark, NP sent at 08/06/2021 12:38 PM EDT ----- Please let her know the ferritin level is adequate.  Follow-up as scheduled.

## 2021-08-20 ENCOUNTER — Encounter: Payer: Self-pay | Admitting: Family Medicine

## 2021-08-20 DIAGNOSIS — J0140 Acute pansinusitis, unspecified: Secondary | ICD-10-CM | POA: Diagnosis not present

## 2021-08-20 DIAGNOSIS — Z681 Body mass index (BMI) 19 or less, adult: Secondary | ICD-10-CM | POA: Diagnosis not present

## 2021-08-20 NOTE — Telephone Encounter (Signed)
Patient has called back.  Our office was full for the day.  I have offered [facility].  Patient declined.  I have offered to schedule appointment for next week, or advised Virtual UC visit through [facility] or to go to UC.

## 2021-08-25 ENCOUNTER — Encounter: Payer: Self-pay | Admitting: Family

## 2021-08-25 ENCOUNTER — Ambulatory Visit (INDEPENDENT_AMBULATORY_CARE_PROVIDER_SITE_OTHER): Payer: Medicare Other | Admitting: Family

## 2021-08-25 DIAGNOSIS — L97511 Non-pressure chronic ulcer of other part of right foot limited to breakdown of skin: Secondary | ICD-10-CM

## 2021-08-25 DIAGNOSIS — M79671 Pain in right foot: Secondary | ICD-10-CM

## 2021-08-25 NOTE — Progress Notes (Signed)
Office Visit Note   [name]           Date of Birth: 1945/01/28           MRN: [id number] Visit Date: 08/25/2021              Requested by: Vivi Barrack, MD [address] [place],  [place] [address] PCP: Vivi Barrack, MD  Chief Complaint  Patient presents with   Right Foot - Wound Check      HPI: The patient is a 76 year old woman who presents today in follow-up.  She is concerned for worsening of her Wagner grade 1 ulcer to the right foot.  Has noticed increased bleeding over the last week after it had healed and she had not been having drainage.  She has had increased pain.  She is in extra-depth shoes with custom orthotics she states she does stay off of her feet for the most part.  Of note she is also completing a course of Augmentin for a sinus infection the 10-day course will end on this coming Friday  Assessment & Plan: Visit Diagnoses: No diagnosis found.  plan: She will complete her Augmentin as prescribed.  She does have a follow-up appointment next week with Dr. Sharol Given which she will keep she will begin daily Dial soap cleansing apply antibacterial ointment and a dry dressing discussed offloading and minimizing weightbearing  Follow-Up Instructions: Return in about 1 week (around 09/01/2021).   Ortho Exam  Patient is alert, oriented, no adenopathy, well-dressed, normal affect, normal respiratory effort. On examination of the right foot the plantar ulcer beneath the third metatarsal head is 2 cm in diameter there is central depth this was debrided with a 10 blade knife back to viable tissue there is a 5 mm in diameter area which probes 4 mm deep this does not probe to bone today there is no active drainage erythema no warmth  Imaging: No results found. No images are attached to the encounter.  Labs: Lab Results  Component Value Date   HGBA1C 7.8 (A) 05/06/2021   HGBA1C 8.1 (H) 08/12/2020   HGBA1C 8.4 (H) 05/29/2020   ESRSEDRATE 51 (H)  12/24/2019   ESRSEDRATE 36 (H) 02/24/2017   CRP 13.2 (H) 12/24/2019   CRP 1.2 (H) 02/24/2017   LABURIC 5.5 09/01/2015   REPTSTATUS 06/03/2020 FINAL 05/29/2020   CULT  05/29/2020    NO GROWTH 5 DAYS Performed at [facility], [place] [address] [place], [place] [address]    LABORGA GROUP B STREP (S.AGALACTIAE) ISOLATED 12/26/2014     Lab Results  Component Value Date   ALBUMIN 3.8 09/08/2020   ALBUMIN 3.4 (L) 05/29/2020   ALBUMIN 3.6 12/26/2019   PREALBUMIN 19.4 03/05/2012    No results found for: "MG" No results found for: "VD25OH"  Lab Results  Component Value Date   PREALBUMIN 19.4 03/05/2012      Latest Ref Rng & Units 08/05/2021    8:02 AM 06/02/2021    7:55 AM 04/01/2021    7:45 AM  CBC EXTENDED  WBC 4.0 - 10.5 K/uL 11.6  10.8  11.5   RBC 3.87 - 5.11 MIL/uL 4.20  4.22  4.25   Hemoglobin 12.0 - 15.0 g/dL 12.5  12.2  11.1   HCT 36.0 - 46.0 % 37.2  36.9  35.7   Platelets 150 - 400 K/uL 360  361  400   NEUT# 1.7 - 7.7 K/uL 7.8  7.4  7.1  Lymph# 0.7 - 4.0 K/uL 2.7  2.5  3.4      There is no height or weight on file to calculate BMI.  Orders:  No orders of the defined types were placed in this encounter.  No orders of the defined types were placed in this encounter.    Procedures: No procedures performed  Clinical Data: No additional findings.  ROS:  All other systems negative, except as noted in the HPI. Review of Systems  Objective: Vital Signs: There were no vitals taken for this visit.  Specialty Comments:  No specialty comments available.  PMFS History: Patient Active Problem List   Diagnosis Date Noted   Coccyx pain 12/18/2020   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[place]) 10/28/2020   Memory loss 11/21/2019   Mild nonproliferative diabetic retinopathy of both eyes ([place]) 10/23/2019   Nuclear sclerotic cataract of both eyes 10/23/2019   Posterior vitreous detachment of right eye 10/23/2019   Retinal hemorrhage of left eye  10/23/2019   Impingement syndrome of right shoulder 07/06/2017   Iron deficiency anemia 05/11/2017   Morbid obesity ([place]) 04/24/2017   Anemia 02/24/2017   Baker's cyst 07/10/2016   Onychomycosis 12/07/2015   Upper airway cough syndrome 03/05/2014   Hypertension associated with diabetes ([place]) 12/05/2006   T2DM (type 2 diabetes mellitus) ([place]) 10/12/2006   Dyslipidemia associated with type 2 diabetes mellitus ([facility]) 10/12/2006   Allergic rhinitis 10/12/2006   GERD - Followed by Dr. Fuller Plan 10/12/2006   IRRITABLE BOWEL SYNDROME - followed by Dr. Fuller Plan 10/12/2006   Peripheral neuropathy 10/11/2006   ALOPECIA NEC 10/11/2006   Past Medical History:  Diagnosis Date   Alopecia    Anemia, mild    Arthritis    Chronic cough    sees pulmonologist   Chronic pain    chest wall and abd - s/p extensive eval   Diabetes mellitus with neuropathy (HCC)    sees endocrine   Diverticulosis    Dyspnea    Fatty liver    GERD (gastroesophageal reflux disease)    takes Nexium bid, hx erosive esophagitis   Headache(784.0)    occasionally;r/t sinus    History of colon polyps    HTN (hypertension)    Hx of amputation of lesser toe (HCC)    sees podiatrist   Hyperlipemia    IBS (irritable bowel syndrome)    Insomnia    takes Elavil nightly   Joint pain    Neuropathy    Neuropathy    Osteomyelitis ([place])    Pneumonia    89/2/22- patient denies   PONV (postoperative nausea and vomiting)    medication did not help with surgery on 03/28/20   Seasonal allergies    takes Zyrtec daily   Sinus tachycardia     Family History  Problem Relation Age of Onset   Lung cancer Mother 48       smoked heavily   Emphysema Mother    Hypertension Father    Hyperlipidemia Father    Diabetes Father    Coronary artery disease Father    Dementia Father    COPD Father        smoked   Stomach cancer Paternal Aunt    Brain cancer Paternal Uncle    Irritable bowel syndrome Other        Several family members on  fathers side    Diabetes Other    Stomach cancer Maternal Aunt    Lung cancer Paternal Uncle  Heart disease Paternal Uncle    Colon cancer Neg Hx     Past Surgical History:  Procedure Laterality Date   AMPUTATION  12/28/2011   Procedure: AMPUTATION DIGIT;  Surgeon: Newt Minion, MD;  Location: [facility];  Service: Orthopedics;  Laterality: Right;  Right Foot 2nd Toe Amputation at MTP (metatarsophalangeal joint)   AMPUTATION Right 04/25/2012   Procedure: Right Foot 3rd Toe Amputation;  Surgeon: Newt Minion, MD;  Location: [facility];  Service: Orthopedics;  Laterality: Right;  Right Foot Third Toe Amputation    AMPUTATION Right 07/27/2012   Procedure: Right 4th Toe Amputation at Metatarsophalangeal;  Surgeon: Newt Minion, MD;  Location: [facility];  Service: Orthopedics;  Laterality: Right;  Right 4th Toe Amputation at Metatarsophalangeal   AMPUTATION Left 07/15/2016   Procedure: Left 2nd Ray Amputation;  Surgeon: Newt Minion, MD;  Location: [facility];  Service: Orthopedics;  Laterality: Left;   AMPUTATION Left 11/18/2016   Procedure: Left 3rd and 4th Ray Amputation;  Surgeon: Newt Minion, MD;  Location: [facility];  Service: Orthopedics;  Laterality: Left;   AMPUTATION Right 03/29/2017   Procedure: RIGHT FOOT 3RD AND 4TH RAY AMPUTATION;  Surgeon: Newt Minion, MD;  Location: [facility];  Service: Orthopedics;  Laterality: Right;   AMPUTATION Left 08/12/2020   Procedure: LEFT TRANSMETATARSAL AMPUTATION;  Surgeon: Newt Minion, MD;  Location: [facility];  Service: Orthopedics;  Laterality: Left;   APPLICATION OF WOUND VAC Left 08/12/2020   Procedure: APPLICATION OF WOUND VAC;  Surgeon: Newt Minion, MD;  Location: [facility];  Service: Orthopedics;  Laterality: Left;   COLONOSCOPY     LAPAROSCOPIC APPENDECTOMY  01/05/2011   Procedure: APPENDECTOMY LAPAROSCOPIC;  Surgeon: Pedro Earls, MD;  Location: [facility];  Service: General;  Laterality: N/A;   OOPHORECTOMY  2001   ROTATOR CUFF REPAIR Right    x 2   TUBAL  LIGATION     VAGINAL HYSTERECTOMY  2001   Social History   Occupational History   Occupation: Retired   Tobacco Use   Smoking status: Never   Smokeless tobacco: Never  Vaping Use   Vaping Use: Never used  Substance and Sexual Activity   Alcohol use: Never   Drug use: Never   Sexual activity: Yes    Birth control/protection: Surgical

## 2021-09-02 ENCOUNTER — Ambulatory Visit (INDEPENDENT_AMBULATORY_CARE_PROVIDER_SITE_OTHER): Payer: Medicare Other | Admitting: Orthopedic Surgery

## 2021-09-02 ENCOUNTER — Ambulatory Visit: Payer: Medicare Other | Admitting: Orthopedic Surgery

## 2021-09-02 DIAGNOSIS — L97511 Non-pressure chronic ulcer of other part of right foot limited to breakdown of skin: Secondary | ICD-10-CM | POA: Diagnosis not present

## 2021-09-09 ENCOUNTER — Encounter: Payer: Self-pay | Admitting: Internal Medicine

## 2021-09-09 ENCOUNTER — Ambulatory Visit (INDEPENDENT_AMBULATORY_CARE_PROVIDER_SITE_OTHER): Payer: Medicare Other | Admitting: Internal Medicine

## 2021-09-09 VITALS — BP 130/80 | HR 69 | Ht 66.0 in | Wt 227.2 lb

## 2021-09-09 DIAGNOSIS — E1165 Type 2 diabetes mellitus with hyperglycemia: Secondary | ICD-10-CM

## 2021-09-09 DIAGNOSIS — E785 Hyperlipidemia, unspecified: Secondary | ICD-10-CM

## 2021-09-09 DIAGNOSIS — G63 Polyneuropathy in diseases classified elsewhere: Secondary | ICD-10-CM | POA: Diagnosis not present

## 2021-09-09 DIAGNOSIS — E1169 Type 2 diabetes mellitus with other specified complication: Secondary | ICD-10-CM | POA: Diagnosis not present

## 2021-09-09 LAB — POCT GLYCOSYLATED HEMOGLOBIN (HGB A1C): Hemoglobin A1C: 9 % — AB (ref 4.0–5.6)

## 2021-09-09 MED ORDER — MOUNJARO 2.5 MG/0.5ML ~~LOC~~ SOAJ: 2.5000 mg | SUBCUTANEOUS | 3 refills | Status: DC

## 2021-09-09 NOTE — Patient Instructions (Addendum)
Please continue: - Metformin 1000 mg 2x a day Insulin Before breakfast Before lunch Before dinner At bedtime  Regular 25(-30) - 25(-30)    Please increase: - Tresiba 40 units daily (may increase to 44 units in 1 week or even higher dose if sugars are still high)- can increase by 4 units every 4 days.  Please try to start: - Mounjaro 2.5 mg weekly x 1 month, then let me know if we can use a higher dose (5 mg weekly)  Please return in 4 months with your sugar log

## 2021-09-09 NOTE — Progress Notes (Signed)
Subjective:     Patient ID: Beth Holden, female   DOB: 08-30-1945, 76 y.o.   MRN:   HPI [name] is a pleasant 76 y.o. 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  Last visit 4 months ago.  Interim history: She has occasional blurry vision, nausea, chest pain. She also has increased urination -unchanged. She c/o memory loss. Will see PCP soon and plans to discuss this with him.  Reviewed HbA1c levels: Lab Results  Component Value Date   HGBA1C 7.8 (A) 05/06/2021   HGBA1C 8.1 (H) 08/12/2020   HGBA1C 8.4 (H) 05/29/2020   She is on:: - Metformin 1000 mg 2x a day - Tresiba 34 units daily Insulin Before breakfast Before lunch Before dinner At bedtime  Regular 25(-30) - 25(-30)    She also tried: [place] 03/2020 -could not start b/c of  $$$ Levemir 47 units in am and 37 units in pm >> $800 for 3 mo supply Januvia 100 mg daily - $450 for 3 mo Invokana 100 mg - $455 for 3 mo Took Byetta before We discussed about starting her on a VGo mechanical pump in the past. She had an appointment with diabetes education but decided not to pursue it. We also tried a sliding scale of regular insulin in the past but she was not using it. We tried Trulicity but she could not tolerate it due to nausea, diarrhea, constipation, weakness. She was previously on NPH, but changed to [place] in 04/2021.  She checks her sugars 2-4 times a day per review of her log: - am:  113-227 >> 98, 129-230, 252 >> 122, 150-289 >> 165-315 - 2h after b'fast : 146-314 >> 190-295 >> 237-278 >> 218-334 - prelunch:    214-314  >> 158-207 >> 107-354 >> 212-364 - 2-3h after lunch:   130-204, 242 >> 121-337 >> 192-335, 402 - before dinner:  72, 128-195, 236 >> 58, 153-217 >> 156-345 - after dinner:  111-222, 265  >> 168-337 >> 203-215-365 - bedtime: 6127-285 >>  119-222, 302 >> 94-373 >> 124-297 - nighttime:  123-241 >> 82-142 >> 60, 79, 89-246 >> 52, 68-208, 330 Lowest: 40s x3 (did not eat supper) >> ... 50s >>  52 >> 63.  Has hypoglycemia awareness in the 70s. Highest: 383 (Prednisone) >> 300s >> 388 >> 402  Meals: - Breakfast: egg, bacon, toast; grits; biscuit; fruit; sometimes skips >> 2 slices of toast + butter - Lunch: 1/2 PB sandwich or soup and yoghurt, sometimes crackers - Dinner: meat + vegetables + some starch - Snacks: fruit   + Mild CKD: BUN  Date Value Ref Range Status  09/08/2020 27 (H) 6 - 23 mg/dL Final   Creatinine, Ser  Date Value Ref Range Status  09/08/2020 0.98 0.40 - 1.20 mg/dL Final  On irbesartan.  -+ HL; latest lipid panel: Lab Results  Component Value Date   CHOL 127 10/28/2020   HDL 46.90 10/28/2020   LDLCALC 47 10/28/2020   TRIG 163.0 (H) 10/28/2020   CHOLHDL 3 10/28/2020  On rosuvastatin 20. She has fatty liver >> was advised to lose weight.  - last eye exam: 02/2021: No DR. Prev. Mild NPDR OU, without macular edema (Dr. Zadie Rhine).  -+ Numbness and tingling in feet-previously on amitriptyline, now on Neurontin.  Derm: Dr. Jarome Matin.    Review of Systems  + see HPI Neurological: no tremors/+ numbness/+ tingling/no  dizziness  I reviewed pt's medications, allergies, PMH, social hx, family hx, and changes were documented in the history of present illness. Otherwise, unchanged from my initial visit note.  Past Medical History:  Diagnosis Date   Alopecia    Anemia, mild    Arthritis    Chronic cough    sees pulmonologist   Chronic pain    chest wall and abd - s/p extensive eval   Diabetes mellitus with neuropathy (HCC)    sees endocrine   Diverticulosis    Dyspnea    Fatty liver    GERD (gastroesophageal reflux disease)    takes Nexium bid, hx erosive esophagitis   Headache(784.0)    occasionally;r/t sinus    History of colon polyps    HTN (hypertension)    Hx of amputation of lesser toe  (HCC)    sees podiatrist   Hyperlipemia    IBS (irritable bowel syndrome)    Insomnia    takes Elavil nightly   Joint pain    Neuropathy    Neuropathy    Osteomyelitis ([place])    Pneumonia    89/2/22- patient denies   PONV (postoperative nausea and vomiting)    medication did not help with surgery on 03/28/20   Seasonal allergies    takes Zyrtec daily   Sinus tachycardia    Past Surgical History:  Procedure Laterality Date   AMPUTATION  12/28/2011   Procedure: AMPUTATION DIGIT;  Surgeon: Newt Minion, MD;  Location: [place];  Service: Orthopedics;  Laterality: Right;  Right Foot 2nd Toe Amputation at MTP (metatarsophalangeal joint)   AMPUTATION Right 04/25/2012   Procedure: Right Foot 3rd Toe Amputation;  Surgeon: Newt Minion, MD;  Location: [place];  Service: Orthopedics;  Laterality: Right;  Right Foot Third Toe Amputation    AMPUTATION Right 07/27/2012   Procedure: Right 4th Toe Amputation at Metatarsophalangeal;  Surgeon: Newt Minion, MD;  Location: [place];  Service: Orthopedics;  Laterality: Right;  Right 4th Toe Amputation at Metatarsophalangeal   AMPUTATION Left 07/15/2016   Procedure: Left 2nd Ray Amputation;  Surgeon: Newt Minion, MD;  Location: [place];  Service: Orthopedics;  Laterality: Left;   AMPUTATION Left 11/18/2016   Procedure: Left 3rd and 4th Ray Amputation;  Surgeon: Newt Minion, MD;  Location: [place];  Service: Orthopedics;  Laterality: Left;   AMPUTATION Right 03/29/2017   Procedure: RIGHT FOOT 3RD AND 4TH RAY AMPUTATION;  Surgeon: Newt Minion, MD;  Location: [place];  Service: Orthopedics;  Laterality: Right;   AMPUTATION Left 08/12/2020   Procedure: LEFT TRANSMETATARSAL AMPUTATION;  Surgeon: Newt Minion, MD;  Location: [place];  Service: Orthopedics;  Laterality: Left;   APPLICATION OF WOUND VAC Left 08/12/2020   Procedure: APPLICATION OF WOUND VAC;  Surgeon: Newt Minion, MD;  Location: [place];  Service: Orthopedics;  Laterality: Left;   COLONOSCOPY      LAPAROSCOPIC APPENDECTOMY  01/05/2011   Procedure: APPENDECTOMY LAPAROSCOPIC;  Surgeon: Pedro Earls, MD;  [facility];  Service: General;  Laterality: N/A;   OOPHORECTOMY  2001   ROTATOR CUFF REPAIR Right    x 2   TUBAL LIGATION     VAGINAL HYSTERECTOMY  2001   Social History   Socioeconomic History   Marital status: Married    Spouse name: Not on file   Number of children: 2   Years of education: Not on file   Highest education level: Not on file  Occupational  History   Occupation: Retired   Tobacco Use   Smoking status: Never   Smokeless tobacco: Never  Vaping Use   Vaping Use: Never used  Substance and Sexual Activity   Alcohol use: Never   Drug use: Never   Sexual activity: Yes    Birth control/protection: Surgical  Other Topics Concern   Not on file  Social History Narrative   Caffeine daily    [employer], [employer] no diploma   Married '66   1 dtr- '78; 1 son '71; 2 grandchildren   Occupation: retired 04   Dad with alzheimers-had to place in [place] (summer '10)         Social Determinants of Radio broadcast assistant Strain: Not on Art therapist Insecurity: Not on file  Transportation Needs: Not on file  Physical Activity: Not on file  Stress: Not on file  Social Connections: Not on file  Intimate Partner Violence: Not on file   Current Outpatient Medications on File Prior to Visit  Medication Sig Dispense Refill   acetaminophen (TYLENOL) 500 MG tablet Take 500 mg by mouth every 8 (eight) hours as needed for mild pain.      azelastine (ASTELIN) 0.1 % nasal spray Place 2 sprays into both nostrils 2 (two) times daily. 30 mL 12   betamethasone dipropionate (DIPROLENE) 0.05 % cream Apply 1 application topically 2 (two) times daily as needed (IRRITATION). 30 g 0   Blood Glucose Monitoring Suppl (ONE TOUCH ULTRA 2) w/Device KIT Use to check blood sugar (Patient taking differently: 1 each by Other route daily.) 1 each 0   cetirizine (ZYRTEC) 10 MG tablet Take 1 tablet  (10 mg total) by mouth daily. 90 tablet 3   cholecalciferol (VITAMIN D) 1000 units tablet Take 1,000 Units by mouth daily.     dicyclomine (BENTYL) 10 MG capsule Take 1 capsule (10 mg total) by mouth 3 (three) times daily as needed for spasms. 90 capsule 5   Fluocinolone Acetonide 0.01 % OIL Use 1-2 drops daily as needed. (Patient taking differently: Place 1-2 drops into both ears daily as needed (ear irritation).) 20 mL 0   gabapentin (NEURONTIN) 100 MG capsule TAKE 2 TO 3 CAPSULES AT BEDTIME 180 capsule 3   glucagon (GLUCAGEN) 1 MG SOLR injection Inject 1 mg into the muscle once as needed for up to 1 dose for low blood sugar. 1 each 11   insulin degludec (TRESIBA FLEXTOUCH) 200 UNIT/ML FlexTouch Pen Inject 34 Units into the skin daily. 12 mL 3   Insulin Pen Needle 32G X 4 MM MISC Use 1x a day 100 each 3   insulin regular (NOVOLIN R RELION) 100 units/mL injection Inject 0.2-0.3 mLs (20-30 Units total) into the skin 3 (three) times daily before meals. Per sliding scale     Insulin Syringe-Needle U-100 (RELION INSULIN SYRINGE 1ML/31G) 31G X 5/16" 1 ML MISC USE 2 TIMES A DAY (Patient taking differently: 1 each by Other route 2 (two) times daily.) 100 each 2   irbesartan-hydrochlorothiazide (AVALIDE) 150-12.5 MG tablet Take 1 tablet by mouth daily. 90 tablet 3   metFORMIN (GLUCOPHAGE) 1000 MG tablet Take 1 tablet (1,000 mg total) by mouth 2 (two) times daily with a meal. 180 tablet 3   metoprolol succinate (TOPROL-XL) 25 MG 24 hr tablet Take 1 tablet (25 mg total) by mouth daily. 90 tablet 3   omeprazole (PRILOSEC) 40 MG capsule Take 1 capsule (40 mg total) by mouth 2 (two) times daily. 180 capsule 3  ONETOUCH ULTRA test strip USE  STRIP TO CHECK GLUCOSE THREE TIMES DAILY 300 each 3   OVER THE COUNTER MEDICATION Apply 1-3 application topically at bedtime as needed (foot pain). TOPRICIN FOOT CREAM - NEUROPATHY FOOT CREAM **APPLIES TO BOTH FEET AT BEDTIME**     Probiotic Product (PROBIOTIC DAILY PO) Take  1 capsule by mouth daily.     rosuvastatin (CRESTOR) 20 MG tablet Take 1 tablet (20 mg total) by mouth daily. 90 tablet 3   vitamin B-12 (CYANOCOBALAMIN) 1000 MCG tablet Take 1,000 mcg daily by mouth.     vitamin C (ASCORBIC ACID) 500 MG tablet Take 500 mg by mouth daily.     No current facility-administered medications on file prior to visit.   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   Codeine Other (See Comments)    Makes her crazy   Propoxyphene Hcl Itching    *DARVOCET    Family History  Problem Relation Age of Onset   Lung cancer Mother 58       smoked heavily   Emphysema Mother    Hypertension Father    Hyperlipidemia Father    Diabetes Father    Coronary artery disease Father    Dementia Father    COPD Father        smoked   Stomach cancer Paternal Aunt    Brain cancer Paternal Uncle    Irritable bowel syndrome Other        Several family members on fathers side    Diabetes Other    Stomach cancer Maternal Aunt    Lung cancer Paternal Uncle    Heart disease Paternal Uncle    Colon cancer Neg Hx     Objective:   Physical Exam There were no vitals taken for this visit.  Wt Readings from Last 3 Encounters:  08/05/21 226 lb (102.5 kg)  05/28/21 227 lb 12.8 oz (103.3 kg)  05/06/21 227 lb 6.4 oz (103.1 kg)   Constitutional: overweight, in NAD Eyes: EOMI, no exophthalmos ENT: moist mucous membranes, no thyromegaly, no cervical lymphadenopathy Cardiovascular: RRR, No MRG Respiratory: CTA B Musculoskeletal: + deformities (several amputations of toes, Charcot deformity of the left foot) Skin: moist, warm, + red-violaceous, healing macular papular rash on LEs Neurological: no tremor with outstretched hands  Assessment:     1. 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 HL  Plan:     1. DM2 - pt with longstanding, uncontrolled, type 2 diabetes, on basal bolus insulin regimen and metformin, with slightly better control at last visit.  At that time, HbA1c was 7.3% (slightly lower).  She was not able to start [place] and she did not have pen needles.  She did have the insulin at home, though.  Since sugars remain wildly fluctuating, between 50s and upper 300s, I advised her to start [place] and sent pen needles to her pharmacy.  We stopped NPH.  We continued the regular insulin at the same doses and also metformin.  Of note, in the past we tried Invokana but this was very expensive.  Wilder Glade was also not  affordable.  We tried Trulicity but she could not tolerate this due to GI symptoms.  She does have IBS with diarrhea alternating with constipation. -At today's visit, sugars are higher.  Upon questioning, she did not change the dose of Tresiba since last visit or let me know about the high blood sugars.  I advised her to increase it now and continue to increase it if needed.  Discussed about recommended titration interval.  She is inquiring about Mounjaro.  We discussed that this is in the same class as Trulicity, which she took before but had some GI intolerance.  However, she would like to try [facility], if covered by insurance.  We will start at a low dose and increase as tolerated.  I advised her that after we started especially after we increase Mounjaro, she may need to decrease the doses of insulin.  For now we will continue metformin. -She was not able to obtain a CGM since last visit.  It  appears that the reason for this is that we did not know his supplier to send it to.  She did not understand this a required nd was not sure why she needed to call  her insurance and talk to them about it.  I clarified with her that she just needs to find out the preferred supplier and let us know so we can send the Rx in. - I advised her to: Patient Instructions  Please continue: - Metformin 1000 mg 2x a day Insulin Before breakfast Before lunch Before dinner At bedtime  Regular 25(-30) - 25(-30)    Please increase: - Tresiba 40 units daily (may increase to 44 units in 1 week or even higher dose if sugars are still high)- can increase by 4 units every 4 days.  Please try to start: - Mounjaro 2.5 mg weekly x 1 month, then let me know if we can use a higher dose (5 mg weekly)  Please return in 4 months with your sugar log  - we checked her HbA1c: 9% (higher) - advised to check sugars at different times of the day - 4x a day, rotating check times - advised for yearly eye exams >> she is UTD - return to clinic in 4 months  2. PN -Related to diabetes -Continues to have numbness and tingling, mostly in her left big toe -Previously on amitriptyline, now on Neurontin.  She takes 2 capsules at night.  I usually refill this for her.  3. HL -Reviewed latest lipid panel from 10/2020: LDL at goal, triglycerides slightly high: Lab Results  Component Value Date   CHOL 127 10/28/2020   HDL 46.90 10/28/2020   LDLCALC 47 10/28/2020   TRIG 163.0 (H) 10/28/2020   CHOLHDL 3 10/28/2020  -She is on Crestor 10 mg daily without side effects  Philemon Kingdom, MD PhD [facility]

## 2021-09-10 ENCOUNTER — Other Ambulatory Visit: Payer: Self-pay

## 2021-09-10 MED ORDER — METOPROLOL SUCCINATE ER 25 MG PO TB24: 25.0000 mg | ORAL_TABLET | Freq: Every day | ORAL | 2 refills | Status: DC

## 2021-09-14 ENCOUNTER — Encounter: Payer: Self-pay | Admitting: Internal Medicine

## 2021-09-16 ENCOUNTER — Encounter: Payer: Self-pay | Admitting: Nurse Practitioner

## 2021-09-16 ENCOUNTER — Other Ambulatory Visit (HOSPITAL_COMMUNITY): Payer: Self-pay

## 2021-09-16 ENCOUNTER — Telehealth: Payer: Self-pay

## 2021-09-16 NOTE — Telephone Encounter (Signed)
Patient Advocate Encounter  Prior Authorization for Beth Holden has been approved.   Effective: "from 09/02/2021 until further notice"  Clista Bernhardt, CPhT Rx Patient Advocate Phone: [phone]

## 2021-09-16 NOTE — Telephone Encounter (Signed)
Patient Advocate Encounter   Received notification from [facility] that prior authorization is required for [facility]  Submitted: 09/16/2021 Key [id number]   Clista Bernhardt, CPhT Rx Patient Advocate Phone: [phone]

## 2021-09-22 ENCOUNTER — Encounter: Payer: Self-pay | Admitting: Orthopedic Surgery

## 2021-09-22 NOTE — Progress Notes (Signed)
Office Visit Note   [name]           Date of Birth: 03-15-1945           MRN: [id number] Visit Date: 09/02/2021              Requested by: Vivi Barrack, MD 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  Patient has completed a course of Augmentin last Friday.  She is currently using antibiotic ointment and Dial soap cleansing.  Assessment & Plan: Visit Diagnoses:  1. Non-pressure chronic ulcer of other part of right foot limited to breakdown of skin ([place])     Plan: Right foot ulcer was debrided callus was pared.  Follow-Up Instructions: Return in about 4 weeks (around 09/30/2021).   Ortho Exam  Patient is alert, oriented, no adenopathy, well-dressed, normal affect, normal respiratory effort. Examination patient has a Wagner grade 1 ulcer of the right forefoot.  After informed consent a 10 blade knife was used to debride the skin and soft tissue back to healthy viable granulation tissue.  After debridement the ulcer was 10 mm in diameter 1 mm deep there was no undermining there is no exposed bone or tendon there is no abscess.  Patient also has a callus on her left transmetatarsal amputation the callus was pared no signs of infection.  Imaging: No results found. No images are attached to the encounter.  Labs: Lab Results  Component Value Date   HGBA1C 9.0 (A) 09/09/2021   HGBA1C 7.8 (A) 05/06/2021   HGBA1C 8.1 (H) 08/12/2020   ESRSEDRATE 51 (H) 12/24/2019   ESRSEDRATE 36 (H) 02/24/2017   CRP 13.2 (H) 12/24/2019   CRP 1.2 (H) 02/24/2017   LABURIC 5.5 09/01/2015   REPTSTATUS 06/03/2020 FINAL 05/29/2020   CULT  05/29/2020    NO GROWTH 5 DAYS Performed at [facility], [place] [address] [place], [place] [address]    LABORGA GROUP B STREP (S.AGALACTIAE) ISOLATED 12/26/2014      Lab Results  Component Value Date   ALBUMIN 3.8 09/08/2020   ALBUMIN 3.4 (L) 05/29/2020   ALBUMIN 3.6 12/26/2019   PREALBUMIN 19.4 03/05/2012    No results found for: "MG" No results found for: "VD25OH"  Lab Results  Component Value Date   PREALBUMIN 19.4 03/05/2012      Latest Ref Rng & Units 08/05/2021    8:02 AM 06/02/2021    7:55 AM 04/01/2021    7:45 AM  CBC EXTENDED  WBC 4.0 - 10.5 K/uL 11.6  10.8  11.5   RBC 3.87 - 5.11 MIL/uL 4.20  4.22  4.25   Hemoglobin 12.0 - 15.0 g/dL 12.5  12.2  11.1   HCT 36.0 - 46.0 % 37.2  36.9  35.7   Platelets 150 - 400 K/uL 360  361  400   NEUT# 1.7 - 7.7 K/uL 7.8  7.4  7.1   Lymph# 0.7 - 4.0 K/uL 2.7  2.5  3.4      There is no height or weight on file to calculate BMI.  Orders:  No orders of the defined types were placed in this encounter.  No orders of the defined types were placed in this encounter.    Procedures: No procedures performed  Clinical Data:  No additional findings.  ROS:  All other systems negative, except as noted in the HPI. Review of Systems  Objective: Vital Signs: There were no vitals taken for this visit.  Specialty Comments:  No specialty comments available.  PMFS History: Patient Active Problem List   Diagnosis Date Noted   Coccyx pain 12/18/2020   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[place]) 10/28/2020   Memory loss 11/21/2019   Mild nonproliferative diabetic retinopathy of both eyes ([place]) 10/23/2019   Nuclear sclerotic cataract of both eyes 10/23/2019   Posterior vitreous detachment of right eye 10/23/2019   Retinal hemorrhage of left eye 10/23/2019   Impingement syndrome of right shoulder 07/06/2017   Iron deficiency anemia 05/11/2017   Morbid obesity ([place]) 04/24/2017   Anemia 02/24/2017   Baker's cyst 07/10/2016   Onychomycosis 12/07/2015   Upper airway cough syndrome 03/05/2014   Hypertension associated with diabetes ([place]) 12/05/2006   T2DM (type 2 diabetes  mellitus) ([place]) 10/12/2006   Dyslipidemia associated with type 2 diabetes mellitus ([place]) 10/12/2006   Allergic rhinitis 10/12/2006   GERD - Followed by Dr. Fuller Plan 10/12/2006   IRRITABLE BOWEL SYNDROME - followed by Dr. Fuller Plan 10/12/2006   Peripheral neuropathy 10/11/2006   ALOPECIA NEC 10/11/2006   Past Medical History:  Diagnosis Date   Alopecia    Anemia, mild    Arthritis    Chronic cough    sees pulmonologist   Chronic pain    chest wall and abd - s/p extensive eval   Diabetes mellitus with neuropathy (HCC)    sees endocrine   Diverticulosis    Dyspnea    Fatty liver    GERD (gastroesophageal reflux disease)    takes Nexium bid, hx erosive esophagitis   Headache(784.0)    occasionally;r/t sinus    History of colon polyps    HTN (hypertension)    Hx of amputation of lesser toe (HCC)    sees podiatrist   Hyperlipemia    IBS (irritable bowel syndrome)    Insomnia    takes Elavil nightly   Joint pain    Neuropathy    Neuropathy    Osteomyelitis ([place])    Pneumonia    89/2/22- patient denies   PONV (postoperative nausea and vomiting)    medication did not help with surgery on 03/28/20   Seasonal allergies    takes Zyrtec daily   Sinus tachycardia     Family History  Problem Relation Age of Onset   Lung cancer Mother 63       smoked heavily   Emphysema Mother    Hypertension Father    Hyperlipidemia Father    Diabetes Father    Coronary artery disease Father    Dementia Father    COPD Father        smoked   Stomach cancer Paternal Aunt    Brain cancer Paternal Uncle    Irritable bowel syndrome Other        Several family members on fathers side    Diabetes Other    Stomach cancer Maternal Aunt    Lung cancer Paternal Uncle    Heart disease Paternal Uncle    Colon cancer Neg Hx     Past Surgical History:  Procedure Laterality Date   AMPUTATION  12/28/2011   Procedure: AMPUTATION DIGIT;  Surgeon: Newt Minion, MD;  Location: [place];  Service:  Orthopedics;  Laterality: Right;  Right Foot 2nd Toe Amputation at MTP (metatarsophalangeal joint)   AMPUTATION Right  04/25/2012   Procedure: Right Foot 3rd Toe Amputation;  Surgeon: Newt Minion, MD;  Location: [place];  Service: Orthopedics;  Laterality: Right;  Right Foot Third Toe Amputation    AMPUTATION Right 07/27/2012   Procedure: Right 4th Toe Amputation at Metatarsophalangeal;  Surgeon: Newt Minion, MD;  Location: [place];  Service: Orthopedics;  Laterality: Right;  Right 4th Toe Amputation at Metatarsophalangeal   AMPUTATION Left 07/15/2016   Procedure: Left 2nd Ray Amputation;  Surgeon: Newt Minion, MD;  Location: [place];  Service: Orthopedics;  Laterality: Left;   AMPUTATION Left 11/18/2016   Procedure: Left 3rd and 4th Ray Amputation;  Surgeon: Newt Minion, MD;  Location: [place];  Service: Orthopedics;  Laterality: Left;   AMPUTATION Right 03/29/2017   Procedure: RIGHT FOOT 3RD AND 4TH RAY AMPUTATION;  Surgeon: Newt Minion, MD;  Location: [place];  Service: Orthopedics;  Laterality: Right;   AMPUTATION Left 08/12/2020   Procedure: LEFT TRANSMETATARSAL AMPUTATION;  Surgeon: Newt Minion, MD;  Location: [place];  Service: Orthopedics;  Laterality: Left;   APPLICATION OF WOUND VAC Left 08/12/2020   Procedure: APPLICATION OF WOUND VAC;  Surgeon: Newt Minion, MD;  Location: [place];  Service: Orthopedics;  Laterality: Left;   COLONOSCOPY     LAPAROSCOPIC APPENDECTOMY  01/05/2011   Procedure: APPENDECTOMY LAPAROSCOPIC;  Surgeon: Pedro Earls, MD;  [facility];  Service: General;  Laterality: N/A;   OOPHORECTOMY  2001   ROTATOR CUFF REPAIR Right    x 2   TUBAL LIGATION     VAGINAL HYSTERECTOMY  2001   Social History   Occupational History   Occupation: Retired   Tobacco Use   Smoking status: Never   Smokeless tobacco: Never  Vaping Use   Vaping Use: Never used  Substance and Sexual Activity   Alcohol use: Never   Drug use: Never   Sexual activity: Yes    Birth  control/protection: Surgical

## 2021-09-24 ENCOUNTER — Ambulatory Visit (INDEPENDENT_AMBULATORY_CARE_PROVIDER_SITE_OTHER): Payer: Medicare Other | Admitting: Family

## 2021-09-24 ENCOUNTER — Telehealth: Payer: Self-pay | Admitting: Orthopedic Surgery

## 2021-09-24 DIAGNOSIS — L02611 Cutaneous abscess of right foot: Secondary | ICD-10-CM

## 2021-09-24 DIAGNOSIS — L97511 Non-pressure chronic ulcer of other part of right foot limited to breakdown of skin: Secondary | ICD-10-CM | POA: Diagnosis not present

## 2021-09-24 MED ORDER — FLUCONAZOLE 150 MG PO TABS: 150.0000 mg | ORAL_TABLET | Freq: Once | ORAL | 0 refills | Status: AC

## 2021-09-24 MED ORDER — AMOXICILLIN-POT CLAVULANATE 500-125 MG PO TABS: 1.0000 | ORAL_TABLET | Freq: Three times a day (TID) | ORAL | 0 refills | Status: DC

## 2021-09-24 NOTE — Telephone Encounter (Signed)
Called pt and she c/o large blood blister on foot and painful. Pt on her way now appt with Junie Panning

## 2021-09-24 NOTE — Telephone Encounter (Signed)
Pt called requesting a call back concerning her foot. Pt is asking for a call back from Autumn F. Pt phone number is [phone].

## 2021-09-25 ENCOUNTER — Telehealth: Payer: Self-pay | Admitting: Physician Assistant

## 2021-09-25 ENCOUNTER — Encounter: Payer: Self-pay | Admitting: Physician Assistant

## 2021-09-25 ENCOUNTER — Other Ambulatory Visit: Payer: Self-pay | Admitting: Physician Assistant

## 2021-09-25 MED ORDER — SULFAMETHOXAZOLE-TRIMETHOPRIM 800-160 MG PO TABS: 1.0000 | ORAL_TABLET | Freq: Two times a day (BID) | ORAL | 0 refills | Status: DC

## 2021-09-25 NOTE — Telephone Encounter (Signed)
Patient called me stating that she has broken out into a rash and that her entire body has started itching after starting the augmentin yesterday.  Says nothing else has changed that could have caused reaction.  Sounds like she did fine with this medicine recently.  She is also allergic to doxy and I am afraid to send in keflex due to this new reaction.  I have sent in bactrim, but told her to give you guys a call on Monday to see if you would like to switch abx

## 2021-09-28 ENCOUNTER — Encounter: Payer: Self-pay | Admitting: Family

## 2021-09-28 DIAGNOSIS — E119 Type 2 diabetes mellitus without complications: Secondary | ICD-10-CM | POA: Diagnosis not present

## 2021-09-28 DIAGNOSIS — H43811 Vitreous degeneration, right eye: Secondary | ICD-10-CM | POA: Diagnosis not present

## 2021-09-28 DIAGNOSIS — H25813 Combined forms of age-related cataract, bilateral: Secondary | ICD-10-CM | POA: Diagnosis not present

## 2021-09-28 LAB — HM DIABETES EYE EXAM

## 2021-09-28 NOTE — Progress Notes (Signed)
Office Visit Note   [name]           Date of Birth: August 07, 1945           MRN: [id number] Visit Date: 09/24/2021              Requested by: Vivi Barrack, [address] [place],  [place] [address] PCP: Vivi Barrack, MD  Chief Complaint  Patient presents with   Right Foot - Pain      HPI: The patient is a 76 year old woman who presents today with concern for new blister to her right foot yesterday she noticed significant pain and a new red blister which is quite not draining today she has been doing dry dressings  No fever no chills  Assessment & Plan: Visit Diagnoses: No diagnosis found.  Plan: We will place her on a course of antibiotics she will begin daily Dial soap cleansing dry dressings discussed offloading the foot she may use antibacterial ointment if she prefers  Follow-Up Instructions: No follow-ups on file.   Ortho Exam  Patient is alert, oriented, no adenopathy, well-dressed, normal affect, normal respiratory effort. On examination of the right foot distal to a chronic Wagner grade 1 ulcer of the forefoot there is a new abscess this was lanced and debrided with a 10 blade knife back to viable tissue after informed consent there is granulation in the wound bed after expressing the purulence.  There is no erythema no ascending cellulitis no warmth  Imaging: No results found. No images are attached to the encounter.  Labs: Lab Results  Component Value Date   HGBA1C 9.0 (A) 09/09/2021   HGBA1C 7.8 (A) 05/06/2021   HGBA1C 8.1 (H) 08/12/2020   ESRSEDRATE 51 (H) 12/24/2019   ESRSEDRATE 36 (H) 02/24/2017   CRP 13.2 (H) 12/24/2019   CRP 1.2 (H) 02/24/2017   LABURIC 5.5 09/01/2015   REPTSTATUS 06/03/2020 FINAL 05/29/2020   CULT  05/29/2020    NO GROWTH 5 DAYS Performed at [facility], [place] [address], [place], [place] [address]    LABORGA GROUP B STREP (S.AGALACTIAE) ISOLATED 12/26/2014     Lab Results  Component Value Date    ALBUMIN 3.8 09/08/2020   ALBUMIN 3.4 (L) 05/29/2020   ALBUMIN 3.6 12/26/2019   PREALBUMIN 19.4 03/05/2012    No results found for: "MG" No results found for: "VD25OH"  Lab Results  Component Value Date   PREALBUMIN 19.4 03/05/2012      Latest Ref Rng & Units 08/05/2021    8:02 AM 06/02/2021    7:55 AM 04/01/2021    7:45 AM  CBC EXTENDED  WBC 4.0 - 10.5 K/uL 11.6  10.8  11.5   RBC 3.87 - 5.11 MIL/uL 4.20  4.22  4.25   Hemoglobin 12.0 - 15.0 g/dL 12.5  12.2  11.1   HCT 36.0 - 46.0 % 37.2  36.9  35.7   Platelets 150 - 400 K/uL 360  361  400   NEUT# 1.7 - 7.7 K/uL 7.8  7.4  7.1   Lymph# 0.7 - 4.0 K/uL 2.7  2.5  3.4      There is no height or weight on file to calculate BMI.  Orders:  No orders of the defined types were placed in this encounter.  Meds ordered this encounter  Medications   amoxicillin-clavulanate (AUGMENTIN) 500-125 MG tablet    Sig: Take 1 tablet (500 mg total) by mouth 3 (three) times daily.  Dispense:  30 tablet    Refill:  0   fluconazole (DIFLUCAN) 150 MG tablet    Sig: Take 1 tablet (150 mg total) by mouth once for 1 dose.    Dispense:  3 tablet    Refill:  0     Procedures: No procedures performed  Clinical Data: No additional findings.  ROS:  All other systems negative, except as noted in the HPI. Review of Systems  Objective: Vital Signs: There were no vitals taken for this visit.  Specialty Comments:  No specialty comments available.  PMFS History: Patient Active Problem List   Diagnosis Date Noted   Coccyx pain 12/18/2020   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[place]) 10/28/2020   Memory loss 11/21/2019   Mild nonproliferative diabetic retinopathy of both eyes ([place]) 10/23/2019   Nuclear sclerotic cataract of both eyes 10/23/2019   Posterior vitreous detachment of right eye 10/23/2019   Retinal hemorrhage of left eye 10/23/2019   Impingement syndrome of right shoulder 07/06/2017   Iron deficiency anemia  05/11/2017   Morbid obesity ([place]) 04/24/2017   Anemia 02/24/2017   Baker's cyst 07/10/2016   Onychomycosis 12/07/2015   Upper airway cough syndrome 03/05/2014   Hypertension associated with diabetes ([place]) 12/05/2006   T2DM (type 2 diabetes mellitus) ([place]) 10/12/2006   Dyslipidemia associated with type 2 diabetes mellitus ([place]) 10/12/2006   Allergic rhinitis 10/12/2006   GERD - Followed by Dr. Fuller Plan 10/12/2006   IRRITABLE BOWEL SYNDROME - followed by Dr. Fuller Plan 10/12/2006   Peripheral neuropathy 10/11/2006   ALOPECIA NEC 10/11/2006   Past Medical History:  Diagnosis Date   Alopecia    Anemia, mild    Arthritis    Chronic cough    sees pulmonologist   Chronic pain    chest wall and abd - s/p extensive eval   Diabetes mellitus with neuropathy (HCC)    sees endocrine   Diverticulosis    Dyspnea    Fatty liver    GERD (gastroesophageal reflux disease)    takes Nexium bid, hx erosive esophagitis   Headache(784.0)    occasionally;r/t sinus    History of colon polyps    HTN (hypertension)    Hx of amputation of lesser toe (HCC)    sees podiatrist   Hyperlipemia    IBS (irritable bowel syndrome)    Insomnia    takes Elavil nightly   Joint pain    Neuropathy    Neuropathy    Osteomyelitis ([place])    Pneumonia    89/2/22- patient denies   PONV (postoperative nausea and vomiting)    medication did not help with surgery on 03/28/20   Seasonal allergies    takes Zyrtec daily   Sinus tachycardia     Family History  Problem Relation Age of Onset   Lung cancer Mother 43       smoked heavily   Emphysema Mother    Hypertension Father    Hyperlipidemia Father    Diabetes Father    Coronary artery disease Father    Dementia Father    COPD Father        smoked   Stomach cancer Paternal Aunt    Brain cancer Paternal Uncle    Irritable bowel syndrome Other        Several family members on fathers side    Diabetes Other    Stomach cancer Maternal Aunt    Lung cancer Paternal  Uncle    Heart disease Paternal Uncle  Colon cancer Neg Hx     Past Surgical History:  Procedure Laterality Date   AMPUTATION  12/28/2011   Procedure: AMPUTATION DIGIT;  Surgeon: Newt Minion, MD;  Location: [facility];  Service: Orthopedics;  Laterality: Right;  Right Foot 2nd Toe Amputation at MTP (metatarsophalangeal joint)   AMPUTATION Right 04/25/2012   Procedure: Right Foot 3rd Toe Amputation;  Surgeon: Newt Minion, MD;  Location: [facility];  Service: Orthopedics;  Laterality: Right;  Right Foot Third Toe Amputation    AMPUTATION Right 07/27/2012   Procedure: Right 4th Toe Amputation at Metatarsophalangeal;  Surgeon: Newt Minion, MD;  Location: [facility];  Service: Orthopedics;  Laterality: Right;  Right 4th Toe Amputation at Metatarsophalangeal   AMPUTATION Left 07/15/2016   Procedure: Left 2nd Ray Amputation;  Surgeon: Newt Minion, MD;  Location: [facility];  Service: Orthopedics;  Laterality: Left;   AMPUTATION Left 11/18/2016   Procedure: Left 3rd and 4th Ray Amputation;  Surgeon: Newt Minion, MD;  Location: [facility];  Service: Orthopedics;  Laterality: Left;   AMPUTATION Right 03/29/2017   Procedure: RIGHT FOOT 3RD AND 4TH RAY AMPUTATION;  Surgeon: Newt Minion, MD;  Location: [facility];  Service: Orthopedics;  Laterality: Right;   AMPUTATION Left 08/12/2020   Procedure: LEFT TRANSMETATARSAL AMPUTATION;  Surgeon: Newt Minion, MD;  Location: [facility];  Service: Orthopedics;  Laterality: Left;   APPLICATION OF WOUND VAC Left 08/12/2020   Procedure: APPLICATION OF WOUND VAC;  Surgeon: Newt Minion, MD;  Location: [facility];  Service: Orthopedics;  Laterality: Left;   COLONOSCOPY     LAPAROSCOPIC APPENDECTOMY  01/05/2011   Procedure: APPENDECTOMY LAPAROSCOPIC;  Surgeon: Pedro Earls, MD;  Location: [facility];  Service: General;  Laterality: N/A;   OOPHORECTOMY  2001   ROTATOR CUFF REPAIR Right    x 2   TUBAL LIGATION     VAGINAL HYSTERECTOMY  2001   Social History   Occupational History    Occupation: Retired   Tobacco Use   Smoking status: Never   Smokeless tobacco: Never  Vaping Use   Vaping Use: Never used  Substance and Sexual Activity   Alcohol use: Never   Drug use: Never   Sexual activity: Yes    Birth control/protection: Surgical

## 2021-09-30 ENCOUNTER — Ambulatory Visit (INDEPENDENT_AMBULATORY_CARE_PROVIDER_SITE_OTHER): Payer: Medicare Other | Admitting: Orthopedic Surgery

## 2021-09-30 DIAGNOSIS — Z89432 Acquired absence of left foot: Secondary | ICD-10-CM

## 2021-09-30 DIAGNOSIS — L97511 Non-pressure chronic ulcer of other part of right foot limited to breakdown of skin: Secondary | ICD-10-CM

## 2021-10-04 ENCOUNTER — Other Ambulatory Visit: Payer: Self-pay

## 2021-10-04 ENCOUNTER — Encounter: Payer: Self-pay | Admitting: Internal Medicine

## 2021-10-04 ENCOUNTER — Other Ambulatory Visit: Payer: Self-pay | Admitting: Internal Medicine

## 2021-10-04 ENCOUNTER — Encounter: Payer: Self-pay | Admitting: *Deleted

## 2021-10-04 DIAGNOSIS — E1165 Type 2 diabetes mellitus with hyperglycemia: Secondary | ICD-10-CM

## 2021-10-04 MED ORDER — ONETOUCH ULTRA 2 W/DEVICE KIT: PACK | 0 refills | Status: DC

## 2021-10-04 MED ORDER — INSULIN PEN NEEDLE 32G X 4 MM MISC: 3 refills | Status: AC

## 2021-10-06 ENCOUNTER — Inpatient Hospital Stay: Payer: Medicare Other | Attending: Oncology

## 2021-10-06 ENCOUNTER — Telehealth: Payer: Self-pay

## 2021-10-06 DIAGNOSIS — D649 Anemia, unspecified: Secondary | ICD-10-CM

## 2021-10-06 DIAGNOSIS — D509 Iron deficiency anemia, unspecified: Secondary | ICD-10-CM | POA: Insufficient documentation

## 2021-10-06 LAB — FERRITIN: Ferritin: 161 ng/mL (ref 11–307)

## 2021-10-06 LAB — CBC WITH DIFFERENTIAL (CANCER CENTER ONLY)
Abs Immature Granulocytes: 0.04 10*3/uL (ref 0.00–0.07)
Basophils Absolute: 0.1 10*3/uL (ref 0.0–0.1)
Basophils Relative: 1 %
Eosinophils Absolute: 0.3 10*3/uL (ref 0.0–0.5)
Eosinophils Relative: 3 %
HCT: 36.7 % (ref 36.0–46.0)
Hemoglobin: 12 g/dL (ref 12.0–15.0)
Immature Granulocytes: 0 %
Lymphocytes Relative: 24 %
Lymphs Abs: 2.4 10*3/uL (ref 0.7–4.0)
MCH: 29 pg (ref 26.0–34.0)
MCHC: 32.7 g/dL (ref 30.0–36.0)
MCV: 88.6 fL (ref 80.0–100.0)
Monocytes Absolute: 0.7 10*3/uL (ref 0.1–1.0)
Monocytes Relative: 7 %
Neutro Abs: 6.4 10*3/uL (ref 1.7–7.7)
Neutrophils Relative %: 65 %
Platelet Count: 403 10*3/uL — ABNORMAL HIGH (ref 150–400)
RBC: 4.14 MIL/uL (ref 3.87–5.11)
RDW: 12.5 % (ref 11.5–15.5)
WBC Count: 9.9 10*3/uL (ref 4.0–10.5)
nRBC: 0 % (ref 0.0–0.2)

## 2021-10-06 NOTE — Telephone Encounter (Signed)
-----   Message from Owens Shark, NP sent at 10/06/2021  4:01 PM EDT ----- Please let her know hemoglobin and ferritin look good.  Follow-up as scheduled.

## 2021-10-06 NOTE — Telephone Encounter (Signed)
Called patient and informed her of information listed below.  Patient verbalized understanding.  Patient's next appointment confirmed.  All questions were answered during phone call.

## 2021-10-08 ENCOUNTER — Encounter: Payer: Self-pay | Admitting: Family Medicine

## 2021-10-10 ENCOUNTER — Encounter: Payer: Self-pay | Admitting: Orthopedic Surgery

## 2021-10-10 NOTE — Progress Notes (Signed)
Office Visit Note   [name]           Date of Birth: [birth year]           MRN: [id number] Visit Date: 09/30/2021              Requested by: Vivi Barrack, MD [address] [place],  [place] [address] PCP: Vivi Barrack, MD  Chief Complaint  Patient presents with   Right Foot - Wound Check      HPI: Patient is a 76 year old woman who presents for evaluation ulceration right foot.  Patient also complains of a painful callus on the left foot.  Assessment & Plan: Visit Diagnoses:  1. Non-pressure chronic ulcer of other part of right foot limited to breakdown of skin ([place])   2. 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[place])     Plan: Ulcer was debrided on the right foot.  Callus pared on the left foot.  Follow-Up Instructions: Return in about 4 weeks (around 10/28/2021).   Ortho Exam  Patient is alert, oriented, no adenopathy, well-dressed, normal affect, normal respiratory effort. Examination patient has no redness or cellulitis or drainage on either foot.  She has a painful callus on the left foot.  After informed consent a 10 blade knife was used to pare the callus there is no open ulcer.  Examination of right foot she has a Wagner grade 1 ulcer beneath the third and fourth metatarsal heads.  After informed consent a 10 blade knife was used to debride the skin and soft tissue back to healthy viable tissue.  Wound was 2 cm in diameter and 1 mm deep after debridement with healthy granulation tissue no tunneling no exposed bone or tendon.  Imaging: No results found. No images are attached to the encounter.  Labs: Lab Results  Component Value Date   HGBA1C 9.0 (A) 09/09/2021   HGBA1C 7.8 (A) 05/06/2021   HGBA1C 8.1 (H) 08/12/2020   ESRSEDRATE 51 (H) 12/24/2019   ESRSEDRATE 36 (H) 02/24/2017   CRP 13.2 (H) 12/24/2019   CRP 1.2 (H) 02/24/2017   LABURIC 5.5 09/01/2015   REPTSTATUS 06/03/2020 FINAL 05/29/2020   CULT  05/29/2020    NO GROWTH 5  DAYS Performed at [facility], [place] [address], [place], [place] [address]    LABORGA GROUP B STREP (S.AGALACTIAE) ISOLATED 12/26/2014     Lab Results  Component Value Date   ALBUMIN 3.8 09/08/2020   ALBUMIN 3.4 (L) 05/29/2020   ALBUMIN 3.6 12/26/2019   PREALBUMIN 19.4 03/05/2012    No results found for: "MG" No results found for: "VD25OH"  Lab Results  Component Value Date   PREALBUMIN 19.4 03/05/2012      Latest Ref Rng & Units 10/06/2021    8:10 AM 08/05/2021    8:02 AM 06/02/2021    7:55 AM  CBC EXTENDED  WBC 4.0 - 10.5 K/uL 9.9  11.6  10.8   RBC 3.87 - 5.11 MIL/uL 4.14  4.20  4.22   Hemoglobin 12.0 - 15.0 g/dL 12.0  12.5  12.2   HCT 36.0 - 46.0 % 36.7  37.2  36.9   Platelets 150 - 400 K/uL 403  360  361   NEUT# 1.7 - 7.7 K/uL 6.4  7.8  7.4   Lymph# 0.7 - 4.0 K/uL 2.4  2.7  2.5      There is no height or weight on file to calculate BMI.  Orders:  No orders  of the defined types were placed in this encounter.  No orders of the defined types were placed in this encounter.    Procedures: No procedures performed  Clinical Data: No additional findings.  ROS:  All other systems negative, except as noted in the HPI. Review of Systems  Objective: Vital Signs: There were no vitals taken for this visit.  Specialty Comments:  No specialty comments available.  PMFS History: Patient Active Problem List   Diagnosis Date Noted   Coccyx pain 12/18/2020   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[place]) 10/28/2020   Memory loss 11/21/2019   Mild nonproliferative diabetic retinopathy of both eyes ([place]) 10/23/2019   Nuclear sclerotic cataract of both eyes 10/23/2019   Posterior vitreous detachment of right eye 10/23/2019   Retinal hemorrhage of left eye 10/23/2019   Impingement syndrome of right shoulder 07/06/2017   Iron deficiency anemia 05/11/2017   Morbid obesity ([place]) 04/24/2017   Anemia 02/24/2017   Baker's cyst 07/10/2016   Onychomycosis  12/07/2015   Upper airway cough syndrome 03/05/2014   Hypertension associated with diabetes ([place]) 12/05/2006   T2DM (type 2 diabetes mellitus) ([place]) 10/12/2006   Dyslipidemia associated with type 2 diabetes mellitus ([place]) 10/12/2006   Allergic rhinitis 10/12/2006   GERD - Followed by Dr. Fuller Plan 10/12/2006   IRRITABLE BOWEL SYNDROME - followed by Dr. Fuller Plan 10/12/2006   Peripheral neuropathy 10/11/2006   ALOPECIA NEC 10/11/2006   Past Medical History:  Diagnosis Date   Alopecia    Anemia, mild    Arthritis    Chronic cough    sees pulmonologist   Chronic pain    chest wall and abd - s/p extensive eval   Diabetes mellitus with neuropathy (HCC)    sees endocrine   Diverticulosis    Dyspnea    Fatty liver    GERD (gastroesophageal reflux disease)    takes Nexium bid, hx erosive esophagitis   Headache(784.0)    occasionally;r/t sinus    History of colon polyps    HTN (hypertension)    Hx of amputation of lesser toe (HCC)    sees podiatrist   Hyperlipemia    IBS (irritable bowel syndrome)    Insomnia    takes Elavil nightly   Joint pain    Neuropathy    Neuropathy    Osteomyelitis ([place])    Pneumonia    89/2/22- patient denies   PONV (postoperative nausea and vomiting)    medication did not help with surgery on 03/28/20   Seasonal allergies    takes Zyrtec daily   Sinus tachycardia     Family History  Problem Relation Age of Onset   Lung cancer Mother 43       smoked heavily   Emphysema Mother    Hypertension Father    Hyperlipidemia Father    Diabetes Father    Coronary artery disease Father    Dementia Father    COPD Father        smoked   Stomach cancer Paternal Aunt    Brain cancer Paternal Uncle    Irritable bowel syndrome Other        Several family members on fathers side    Diabetes Other    Stomach cancer Maternal Aunt    Lung cancer Paternal Uncle    Heart disease Paternal Uncle    Colon cancer Neg Hx     Past Surgical History:  Procedure  Laterality Date   AMPUTATION  12/28/2011   Procedure: AMPUTATION  DIGIT;  Surgeon: Newt Minion, MD;  Location: [facility];  Service: Orthopedics;  Laterality: Right;  Right Foot 2nd Toe Amputation at MTP (metatarsophalangeal joint)   AMPUTATION Right 04/25/2012   Procedure: Right Foot 3rd Toe Amputation;  Surgeon: Newt Minion, MD;  Location: [facility];  Service: Orthopedics;  Laterality: Right;  Right Foot Third Toe Amputation    AMPUTATION Right 07/27/2012   Procedure: Right 4th Toe Amputation at Metatarsophalangeal;  Surgeon: Newt Minion, MD;  Location: [facility];  Service: Orthopedics;  Laterality: Right;  Right 4th Toe Amputation at Metatarsophalangeal   AMPUTATION Left 07/15/2016   Procedure: Left 2nd Ray Amputation;  Surgeon: Newt Minion, MD;  Location: [facility];  Service: Orthopedics;  Laterality: Left;   AMPUTATION Left 11/18/2016   Procedure: Left 3rd and 4th Ray Amputation;  Surgeon: Newt Minion, MD;  Location: [facility];  Service: Orthopedics;  Laterality: Left;   AMPUTATION Right 03/29/2017   Procedure: RIGHT FOOT 3RD AND 4TH RAY AMPUTATION;  Surgeon: Newt Minion, MD;  Location: [facility];  Service: Orthopedics;  Laterality: Right;   AMPUTATION Left 08/12/2020   Procedure: LEFT TRANSMETATARSAL AMPUTATION;  Surgeon: Newt Minion, MD;  Location: [facility];  Service: Orthopedics;  Laterality: Left;   APPLICATION OF WOUND VAC Left 08/12/2020   Procedure: APPLICATION OF WOUND VAC;  Surgeon: Newt Minion, MD;  Location: [facility];  Service: Orthopedics;  Laterality: Left;   COLONOSCOPY     LAPAROSCOPIC APPENDECTOMY  01/05/2011   Procedure: APPENDECTOMY LAPAROSCOPIC;  Surgeon: Pedro Earls, MD;  [facility];  Service: General;  Laterality: N/A;   OOPHORECTOMY  2001   ROTATOR CUFF REPAIR Right    x 2   TUBAL LIGATION     VAGINAL HYSTERECTOMY  2001   Social History   Occupational History   Occupation: Retired   Tobacco Use   Smoking status: Never   Smokeless tobacco: Never  Vaping Use   Vaping  Use: Never used  Substance and Sexual Activity   Alcohol use: Never   Drug use: Never   Sexual activity: Yes    Birth control/protection: Surgical

## 2021-10-20 ENCOUNTER — Other Ambulatory Visit: Payer: Self-pay | Admitting: Internal Medicine

## 2021-10-20 DIAGNOSIS — E1159 Type 2 diabetes mellitus with other circulatory complications: Secondary | ICD-10-CM

## 2021-10-21 ENCOUNTER — Telehealth (INDEPENDENT_AMBULATORY_CARE_PROVIDER_SITE_OTHER): Payer: Medicare Other | Admitting: Family Medicine

## 2021-10-21 ENCOUNTER — Encounter: Payer: Self-pay | Admitting: Family Medicine

## 2021-10-21 DIAGNOSIS — U071 COVID-19: Secondary | ICD-10-CM

## 2021-10-21 MED ORDER — MOLNUPIRAVIR EUA 200MG CAPSULE: 4.0000 | ORAL_CAPSULE | Freq: Two times a day (BID) | ORAL | 0 refills | Status: AC

## 2021-10-21 MED ORDER — BENZONATATE 100 MG PO CAPS: ORAL_CAPSULE | ORAL | 0 refills | Status: DC

## 2021-10-21 NOTE — Patient Instructions (Addendum)
HOME CARE TIPS:   -I sent the medication(s) we discussed to your pharmacy: Meds ordered this encounter  Medications   molnupiravir EUA (LAGEVRIO) 200 mg CAPS capsule    Sig: Take 4 capsules (800 mg total) by mouth 2 (two) times daily for 5 days.    Dispense:  40 capsule    Refill:  0   benzonatate (TESSALON PERLES) 100 MG capsule    Sig: 1-2 capsules up to twice daily as needed for cough.    Dispense:  30 capsule    Refill:  0     -I sent in the Covid19 treatment or referral you requested per our discussion. Please see the information provided below and discuss further with the pharmacist/treatment team.   -there is a chance of rebound illness with covid after improving. This can happen whether or not you take an antiviral treatment. If you become sick again with covid after getting better, please schedule a follow up virtual visit and isolate again.  -can use tylenol  if needed for fevers, aches and pains per instructions  -nasal saline sinus rinses twice daily  -stay hydrated, drink plenty of fluids and eat small healthy meals - avoid dairy  -follow up with your doctor in 2-3 days unless improving and feeling better  -stay home while sick, except to seek medical care. If you have COVID19, you will likely be contagious for 7-10 days. Flu or Influenza is likely contagious for about 7 days. Other respiratory viral infections remain contagious for 5-10+ days depending on the virus and many other factors. Wear a good mask that fits snugly (such as N95 or KN95) if around others to reduce the risk of transmission.  It was nice to meet you today, and I really hope you are feeling better soon. I help [facility] out with telemedicine visits on Tuesdays and Thursdays and am happy to help if you need a follow up virtual visit on those days. Otherwise, if you have any concerns or questions following this visit please schedule a follow up visit with your Primary Care doctor or seek care at a  local urgent care clinic to avoid delays in care.    Seek in person care or schedule a follow up video visit promptly if your symptoms worsen, new concerns arise or you are not improving with treatment. Call 911 and/or seek emergency care if your symptoms are severe or life threatening.  PLEASE SEE THE FOLLOWING LINK FOR THE MOST UPDATED INFORMATION ABOUT LAGEVRIO:  [email or web address]      Fact Sheet for Patients And Caregivers Emergency Use Authorization (EUA) Of LAGEVRIOT (molnupiravir) capsules For Coronavirus Disease 2019 (COVID-19)  What is the most important information I should know about LAGEVRIO? LAGEVRIO may cause serious side effects, including: ? LAGEVRIO may cause harm to your unborn baby. It is not known if LAGEVRIO will harm your baby if you take LAGEVRIO during pregnancy. o LAGEVRIO is not recommended for use in pregnancy. o LAGEVRIO has not been studied in pregnancy. LAGEVRIO was studied in pregnant animals only. When LAGEVRIO was given to pregnant animals, LAGEVRIO caused harm to their unborn babies. o You and your healthcare provider may decide that you should take LAGEVRIO during pregnancy if there are no other COVID-19 treatment options approved or authorized by the FDA that are accessible or clinically appropriate for you. o If you and your healthcare provider decide that you should take LAGEVRIO during pregnancy, you and your healthcare provider should discuss the known and potential benefits and   the potential risks of taking LAGEVRIO during pregnancy. For individuals who are able to become pregnant: ? You should use a reliable method of birth control (contraception) consistently and correctly during treatment with LAGEVRIO and for 4 days after the last dose of LAGEVRIO. Talk to your healthcare provider about reliable birth control methods. ? Before starting treatment with [facility] your healthcare provider may do a pregnancy test to see if you are  pregnant before starting treatment with LAGEVRIO. ? Tell your healthcare provider right away if you become pregnant or think you may be pregnant during treatment with LAGEVRIO. Pregnancy Surveillance Program: ? There is a pregnancy surveillance program for individuals who take LAGEVRIO during pregnancy. The purpose of this program is to collect information about the health of you and your baby. Talk to your healthcare provider about how to take part in this program. ? If you take LAGEVRIO during pregnancy and you agree to participate in the pregnancy surveillance program and allow your healthcare provider to share your information with [place], then your healthcare provider will report your use of [place] during pregnancy to [facility]. by calling [phone] or [email or web address]. For individuals who are sexually active with partners who are able to become pregnant: ? It is not known if LAGEVRIO can affect sperm. While the risk is regarded as low, animal studies to fully assess the potential for LAGEVRIO to affect the babies of males treated with LAGEVRIO have not been completed. A reliable method of birth control (contraception) should be used consistently and correctly during treatment with LAGEVRIO and for at least 3 months after the last dose. The risk to sperm beyond 3 months is not known. Studies to understand the risk to sperm beyond 3 months are ongoing. Talk to your healthcare provider about reliable birth control methods. Talk to your healthcare provider if you have questions or concerns about how LAGEVRIO may affect sperm. You are being given this fact sheet because your healthcare provider believes it is necessary to provide you with LAGEVRIO for the treatment of adults with mild-to-moderate coronavirus disease 2019 (COVID-19) with positive results of direct SARS-CoV-2 viral testing, and who are at high risk for progression to severe COVID-19  including hospitalization or death, and for whom other COVID-19 treatment options approved or authorized by the FDA are not accessible or clinically appropriate. The U.S. Food and Drug Administration (FDA) has issued an Emergency Use Authorization (EUA) to make LAGEVRIO available during the COVID-19 pandemic (for more details about an EUA please see "What is an Emergency Use Authorization?" at the end of this document). LAGEVRIO is not an FDA-approved medicine in the [place]. Read this Fact Sheet for information about LAGEVRIO. Talk to your healthcare provider about your options if you have any questions. It is your choice to take LAGEVRIO.  What is COVID-19? COVID-19 is caused by a virus called a coronavirus. You can get COVID-19 through close contact with another person who has the virus. COVID-19 illnesses have ranged from very mild-to-severe, including illness resulting in death. While information so far suggests that most COVID-19 illness is mild, serious illness can happen and may cause some of your other medical conditions to become worse. Older people and people of all ages with severe, long lasting (chronic) medical conditions like heart disease, lung disease and diabetes, for example seem to be at higher risk of being hospitalized for COVID-19.  What is LAGEVRIO? LAGEVRIO is an investigational medicine used to treat mild-to-moderate COVID-19 in  adults: ? with positive results of direct SARS-CoV-2 viral testing, and ? who are at high risk for progression to severe COVID-19 including hospitalization or death, and for whom other COVID-19 treatment options approved or authorized by the FDA are not accessible or clinically appropriate. The FDA has authorized the emergency use of LAGEVRIO for the treatment of mild-tomoderate COVID-19 in adults under an EUA. For more information on EUA, see the "What is an Emergency Use Authorization (EUA)?" section at the end of this Fact  Sheet. LAGEVRIO is not authorized: ? for use in people less than 74 years of age. ? for prevention of COVID-19. ? for people needing hospitalization for COVID-19. ? for use for longer than 5 consecutive days.  What should I tell my healthcare provider before I take LAGEVRIO? Tell your healthcare provider if you: ? Have any allergies ? Are breastfeeding or plan to breastfeed ? Have any serious illnesses ? Are taking any medicines (prescription, over-the-counter, vitamins, or herbal products).  How do I take LAGEVRIO? ? Take LAGEVRIO exactly as your healthcare provider tells you to take it. ? Take 4 capsules of LAGEVRIO every 12 hours (for example, at 8 am and at 8 pm) ? Take LAGEVRIO for 5 days. It is important that you complete the full 5 days of treatment with LAGEVRIO. Do not stop taking LAGEVRIO before you complete the full 5 days of treatment, even if you feel better. ? Take LAGEVRIO with or without food. ? You should stay in isolation for as long as your healthcare provider tells you to. Talk to your healthcare provider if you are not sure about how to properly isolate while you have COVID-19. ? Swallow LAGEVRIO capsules whole. Do not open, break, or crush the capsules. If you cannot swallow capsules whole, tell your healthcare provider. ? What to do if you miss a dose: o If it has been less than 10 hours since the missed dose, take it as soon as you remember o If it has been more than 10 hours since the missed dose, skip the missed dose and take your dose at the next scheduled time. ? Do not double the dose of LAGEVRIO to make up for a missed dose.  What are the important possible side effects of LAGEVRIO? ? See, "What is the most important information I should know about LAGEVRIO?" ? Allergic Reactions. Allergic reactions can happen in people taking LAGEVRIO, even after only 1 dose. Stop taking LAGEVRIO and call your healthcare provider right away if you get any of the  following symptoms of an allergic reaction: o hives o rapid heartbeat o trouble swallowing or breathing o swelling of the mouth, lips, or face o throat tightness o hoarseness o skin rash The most common side effects of LAGEVRIO are: ? diarrhea ? nausea ? dizziness These are not all the possible side effects of LAGEVRIO. Not many people have taken LAGEVRIO. Serious and unexpected side effects may happen. This medicine is still being studied, so it is possible that all of the risks are not known at this time.  What other treatment choices are there?  Veklury (remdesivir) is FDA-approved as an intravenous (IV) infusion for the treatment of mildto-moderate YSAYT-01 in certain adults and children. Talk with your doctor to see if Marijean Heath is appropriate for you. Like LAGEVRIO, FDA may also allow for the emergency use of other medicines to treat people with COVID-19. Go to [email or web address] for more information. It is your choice to be treated or not to  be treated with LAGEVRIO. Should you decide not to take it, it will not change your standard medical care.  What if I am breastfeeding? Breastfeeding is not recommended during treatment with LAGEVRIO and for 4 days after the last dose of LAGEVRIO. If you are breastfeeding or plan to breastfeed, talk to your healthcare provider about your options and specific situation before taking LAGEVRIO.  How do I report side effects with LAGEVRIO? Contact your healthcare provider if you have any side effects that bother you or do not go away. Report side effects to FDA MedWatch at www.fda.gov/medwatch or call 1-800-FDA-1088 (1- 800-332-1088).  How should I store LAGEVRIO? ? Store LAGEVRIO capsules at room temperature between 68F to 77F (20C to 25C). ? Keep LAGEVRIO and all medicines out of the reach of children and pets. How can I learn more  about COVID-19? ? Ask your healthcare provider. ? Visit www.cdc.gov/COVID19 ? Contact your local or state public health department. ? Call [employer] at 1-800-672-6372 (toll free in the [place] ? Visit [email or web address].com  What Is an Emergency Use Authorization (EUA)? The [place] FDA has made LAGEVRIO available under an emergency access mechanism called an Emergency Use Authorization (EUA) The EUA is supported by a Secretary of Health and Human Service (HHS) declaration that circumstances exist to justify emergency use of drugs and biological products during the COVID-19 pandemic. LAGEVRIO for the treatment of mild-to-moderate COVID-19 in adults with positive results of direct SARS-CoV-2 viral testing, who are at high risk for progression to severe COVID-19, including hospitalization or death, and for whom alternative COVID-19 treatment options approved or authorized by FDA are not accessible or clinically appropriate, has not undergone the same type of review as an FDA-approved product. In issuing an EUA under the COVID-19 public health emergency, the FDA has determined, among other things, that based on the total amount of scientific evidence available including data from adequate and well-controlled clinical trials, if available, it is reasonable to believe that the product may be effective for diagnosing, treating, or preventing COVID-19, or a serious or life-threatening disease or condition caused by COVID-19; that the known and potential benefits of the product, when used to diagnose, treat, or prevent such disease or condition, outweigh the known and potential risks of such product; and that there are no adequate, approved, and available alternatives.  All of these criteria must be met to allow for the product to be used in the treatment of patients during the COVID-19 pandemic. The EUA for LAGEVRIO is in effect for the duration of the COVID-19 declaration justifying  emergency use of LAGEVRIO, unless terminated or revoked (after which LAGEVRIO may no longer be used under the EUA). For patent information: www.msd.com/research/patent Copyright  2021-2022 [employer] [place], [place] and its affiliates. All rights reserved. [id number] Revised: March 2022   

## 2021-10-21 NOTE — Progress Notes (Signed)
Virtual Visit via Video Note  I connected with Beth Holden  on 10/21/21 at  3:40 PM EDT by a video enabled telemedicine application and verified that I am speaking with the correct person using two identifiers.  Location patient: [place] Location provider:work or home office Persons participating in the virtual visit: patient, provider  I discussed the limitations and requested verbal permission for telemedicine visit. The patient expressed understanding and agreed to proceed.   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   Codeine Other (See Comments)    Makes her crazy   Propoxyphene Hcl Itching    *DARVOCET    -COVID-19 vaccine status:  Immunization History  Administered Date(s) Administered   Fluad Quad(high Dose 65+) 10/28/2020   Influenza Split 01/06/2011   Influenza Whole 10/26/2007, 10/14/2008   Influenza, High Dose Seasonal PF 10/26/2015, 11/08/2016, 11/30/2017, 12/11/2018   Influenza-Unspecified 12/16/2019   Moderna Covid-19 Vaccine Bivalent Booster 38yr & up 11/20/2020   Moderna Sars-Covid-2 Vaccination 02/09/2019, 03/11/2019, 12/16/2019   Pneumococcal Conjugate-13 04/15/2013   Pneumococcal Polysaccharide-23 02/04/2010, 11/30/2015   Td 02/11/2010   Zoster, Live 02/04/2010     ROS: See pertinent positives and negatives per HPI.  Past Medical History:  Diagnosis Date   Alopecia    Anemia, mild    Arthritis    Chronic cough    sees pulmonologist   Chronic pain    chest wall and abd - s/p extensive eval   Diabetes mellitus with neuropathy (HCC)     sees endocrine   Diverticulosis    Dyspnea    Fatty liver    GERD (gastroesophageal reflux disease)    takes Nexium bid, hx erosive esophagitis   Headache(784.0)    occasionally;r/t sinus    History of colon polyps    HTN (hypertension)    Hx of amputation of lesser toe (HCC)    sees podiatrist   Hyperlipemia    IBS (irritable bowel syndrome)    Insomnia    takes Elavil nightly   Joint pain    Neuropathy    Neuropathy    Osteomyelitis ([facility]    Pneumonia    89/2/22- patient denies   PONV (postoperative nausea and vomiting)    medication did not help with surgery on 03/28/20   Seasonal allergies    takes Zyrtec daily   Sinus tachycardia     Past Surgical History:  Procedure Laterality Date   AMPUTATION  12/28/2011   Procedure: AMPUTATION DIGIT;  Surgeon: MNewt Minion MD;  [facility] [facility]  Service: Orthopedics;  Laterality: Right;  Right Foot 2nd Toe Amputation at MTP (metatarsophalangeal joint)   AMPUTATION Right 04/25/2012   Procedure: Right Foot 3rd Toe Amputation;  Surgeon: MNewt Minion MD;  [place] [facility]  Service: Orthopedics;  Laterality: Right;  Right Foot Third Toe Amputation    AMPUTATION Right 07/27/2012   Procedure: Right 4th Toe Amputation at Metatarsophalangeal;  Surgeon: MNewt Minion MD;  [facility]  Service: Orthopedics;  Laterality: Right;  Right 4th Toe Amputation at Metatarsophalangeal   AMPUTATION Left 07/15/2016   Procedure: Left 2nd Ray Amputation;  Surgeon: DMeridee Score  V, MD;  Location: [facility];  Service: Orthopedics;  Laterality: Left;   AMPUTATION Left 11/18/2016   Procedure: Left 3rd and 4th Ray Amputation;  Surgeon: Newt Minion, MD;  Location: [facility];  Service: Orthopedics;  Laterality: Left;   AMPUTATION Right 03/29/2017   Procedure: RIGHT FOOT 3RD AND 4TH RAY AMPUTATION;  Surgeon: Newt Minion, MD;  Location: [facility];  Service: Orthopedics;  Laterality: Right;   AMPUTATION Left 08/12/2020   Procedure: LEFT TRANSMETATARSAL AMPUTATION;   Surgeon: Newt Minion, MD;  Location: [facility];  Service: Orthopedics;  Laterality: Left;   APPLICATION OF WOUND VAC Left 08/12/2020   Procedure: APPLICATION OF WOUND VAC;  Surgeon: Newt Minion, MD;  Location: [facility];  Service: Orthopedics;  Laterality: Left;   COLONOSCOPY     LAPAROSCOPIC APPENDECTOMY  01/05/2011   Procedure: APPENDECTOMY LAPAROSCOPIC;  Surgeon: Pedro Earls, MD;  Location: [facility];  Service: General;  Laterality: N/A;   OOPHORECTOMY  2001   ROTATOR CUFF REPAIR Right    x 2   TUBAL LIGATION     VAGINAL HYSTERECTOMY  2001     Current Outpatient Medications:    acetaminophen (TYLENOL) 500 MG tablet, Take 500 mg by mouth every 8 (eight) hours as needed for mild pain. , Disp: , Rfl:    azelastine (ASTELIN) 0.1 % nasal spray, Place 2 sprays into both nostrils 2 (two) times daily., Disp: 30 mL, Rfl: 12   benzonatate (TESSALON PERLES) 100 MG capsule, 1-2 capsules up to twice daily as needed for cough, Disp: 30 capsule, Rfl: 0   betamethasone dipropionate (DIPROLENE) 0.05 % cream, Apply 1 application topically 2 (two) times daily as needed (IRRITATION)., Disp: 30 g, Rfl: 0   Blood Glucose Monitoring Suppl (ONE TOUCH ULTRA 2) w/Device KIT, Use to check blood sugar, Disp: 1 kit, Rfl: 0   cetirizine (ZYRTEC) 10 MG tablet, Take 1 tablet (10 mg total) by mouth daily., Disp: 90 tablet, Rfl: 3   cholecalciferol (VITAMIN D) 1000 units tablet, Take 1,000 Units by mouth daily., Disp: , Rfl:    Fluocinolone Acetonide 0.01 % OIL, Use 1-2 drops daily as needed., Disp: 20 mL, Rfl: 0   gabapentin (NEURONTIN) 100 MG capsule, TAKE 2 TO 3 CAPSULES AT    BEDTIME, Disp: 180 capsule, Rfl: 3   glucagon (GLUCAGEN) 1 MG SOLR injection, Inject 1 mg into the muscle once as needed for up to 1 dose for low blood sugar., Disp: 1 each, Rfl: 11   insulin degludec (TRESIBA FLEXTOUCH) 200 UNIT/ML FlexTouch Pen, Inject 34 Units into the skin daily., Disp: 12 mL, Rfl: 3   Insulin Pen Needle 32G X 4 MM MISC, Use  1x a day, Disp: 100 each, Rfl: 3   insulin regular (NOVOLIN R RELION) 100 units/mL injection, Inject 0.2-0.3 mLs (20-30 Units total) into the skin 3 (three) times daily before meals. Per sliding scale, Disp: , Rfl:    Insulin Syringe-Needle U-100 (RELION INSULIN SYRINGE 1ML/31G) 31G X 5/16" 1 ML MISC, USE 2 TIMES A DAY (Patient taking differently: 1 each by Other route 2 (two) times daily.), Disp: 100 each, Rfl: 2   irbesartan-hydrochlorothiazide (AVALIDE) 150-12.5 MG tablet, Take 1 tablet by mouth daily., Disp: 90 tablet, Rfl: 3   metFORMIN (GLUCOPHAGE) 1000 MG tablet, Take 1 tablet (1,000 mg total) by mouth 2 (two) times daily with a meal., Disp: 180 tablet, Rfl: 3   metoprolol succinate (TOPROL-XL) 25 MG 24 hr tablet, Take 1 tablet (25 mg total)  by mouth daily., Disp: 90 tablet, Rfl: 2   molnupiravir EUA (LAGEVRIO) 200 mg CAPS capsule, Take 4 capsules (800 mg total) by mouth 2 (two) times daily for 5 days., Disp: 40 capsule, Rfl: 0   omeprazole (PRILOSEC) 40 MG capsule, Take 1 capsule (40 mg total) by mouth 2 (two) times daily., Disp: 180 capsule, Rfl: 3   ONETOUCH ULTRA test strip, USE  STRIP TO CHECK GLUCOSE THREE TIMES DAILY, Disp: 300 each, Rfl: 0   OVER THE COUNTER MEDICATION, Apply 1-3 application topically at bedtime as needed (foot pain). TOPRICIN FOOT CREAM - NEUROPATHY FOOT CREAM **APPLIES TO BOTH FEET AT BEDTIME**, Disp: , Rfl:    Probiotic Product (PROBIOTIC DAILY PO), Take 1 capsule by mouth daily., Disp: , Rfl:    rosuvastatin (CRESTOR) 20 MG tablet, Take 1 tablet (20 mg total) by mouth daily., Disp: 90 tablet, Rfl: 3   vitamin B-12 (CYANOCOBALAMIN) 1000 MCG tablet, Take 1,000 mcg daily by mouth., Disp: , Rfl:    vitamin C (ASCORBIC ACID) 500 MG tablet, Take 500 mg by mouth daily., Disp: , Rfl:    dicyclomine (BENTYL) 10 MG capsule, Take 1 capsule (10 mg total) by mouth 3 (three) times daily as needed for spasms. (Patient not taking: Reported on 10/21/2021), Disp: 90 capsule, Rfl:  5  EXAM:  VITALS per patient if applicable:  GENERAL: alert, oriented, appears well and in no acute distress  HEENT: atraumatic, conjunttiva clear, no obvious abnormalities on inspection of external nose and ears  NECK: normal movements of the head and neck  LUNGS: on inspection no signs of respiratory distress, breathing rate appears normal, no obvious gross SOB, gasping or wheezing  CV: no obvious cyanosis  MS: moves all visible extremities without noticeable abnormality  PSYCH/NEURO: pleasant and cooperative, no obvious depression or anxiety, speech and thought processing grossly intact  ASSESSMENT AND PLAN:  Discussed the following assessment and plan:  COVID-19   Discussed treatment options, side effect and risk of drug interactions, ideal treatment window, potential complications, isolation and precautions for COVID-19.  Checked for/reviewed last GFR - listed in HPI if available. After lengthy discussion, the patient opted for treatment with legevrio due to being higher risk for complications of covid or severe disease and other factors. Discussed EUA status of this drug and the fact that there is preliminary limited knowledge of risks/interactions/side effects per EUA document vs possible benefits and precautions. This information was shared with patient during the visit and also was provided in patient instructions. The patient did want a prescription for cough, Tessalon Rx sent.  Other symptomatic care measures summarized in patient instructions.  Advised to seek prompt virtual visit or in person care if worsening, new symptoms arise, or if is not improving with treatment as expected per our conversation of expected course. Discussed options for follow up care. Did let this patient know that I do telemedicine on Tuesdays and Thursdays for [place] and those are the days I am logged into the system. Advised to schedule follow up visit with PCP, [place] virtual visits or [facility] if  any further questions or concerns to avoid delays in care.   I discussed the assessment and treatment plan with the patient. The patient was provided an opportunity to ask questions and all were answered. The patient agreed with the plan and demonstrated an understanding of the instructions.     Lucretia Kern, DO

## 2021-10-21 NOTE — Telephone Encounter (Signed)
Spoke with patient, stated has virtual appointment in a few min

## 2021-10-26 ENCOUNTER — Ambulatory Visit: Payer: Medicare Other | Admitting: Orthopedic Surgery

## 2021-11-01 ENCOUNTER — Encounter: Payer: Self-pay | Admitting: Family Medicine

## 2021-11-01 ENCOUNTER — Encounter (INDEPENDENT_AMBULATORY_CARE_PROVIDER_SITE_OTHER): Payer: Medicare Other | Admitting: Ophthalmology

## 2021-11-02 ENCOUNTER — Encounter: Payer: Self-pay | Admitting: Orthopedic Surgery

## 2021-11-02 ENCOUNTER — Ambulatory Visit (INDEPENDENT_AMBULATORY_CARE_PROVIDER_SITE_OTHER): Payer: Medicare Other | Admitting: Orthopedic Surgery

## 2021-11-02 DIAGNOSIS — L97511 Non-pressure chronic ulcer of other part of right foot limited to breakdown of skin: Secondary | ICD-10-CM

## 2021-11-02 NOTE — Telephone Encounter (Signed)
Ok to placed labs?

## 2021-11-02 NOTE — Telephone Encounter (Signed)
Ok to place labs same as last year. This includes her kidney function which we typically check yearly.  Algis Greenhouse. Jerline Pain, MD 11/02/2021 11:51 AM

## 2021-11-02 NOTE — Progress Notes (Signed)
Office Visit Note   [name]           Date of Birth: 1945-06-18           MRN: [id number] Visit Date: 11/02/2021              Requested by: Vivi Barrack, [address] [place],  [place] [id number] PCP: Vivi Barrack, MD  Chief Complaint  Patient presents with   Right Foot - Wound Check   Left Foot - Wound Check    Hx transmet amputation      HPI: Patient is a 76 year old woman who presents for follow-up for ulcerations both lower extremities.  Patient feels like she is seeing an improvement.  Assessment & Plan: Visit Diagnoses:  1. Non-pressure chronic ulcer of other part of right foot limited to breakdown of skin ([place])     Plan: Callus pared on the left foot ulcer debrided on the right foot.  Patient will increase her activities as tolerated.  Follow-Up Instructions: Return in about 4 weeks (around 11/30/2021).   Ortho Exam  Patient is alert, oriented, no adenopathy, well-dressed, normal affect, normal respiratory effort. Examination patient has no redness or cellulitis of either foot.  The transmetatarsal amputation the left has some callus which was pared there were no open ulcers.  Examination of the right foot the midfoot plantar ulcer was debrided after informed consent with a 10 blade knife.  After debridement there was good petechial bleeding this was touched with silver nitrate.  The ulcer was 2 cm in diameter and 2 mm deep after debridement.  Imaging: No results found. No images are attached to the encounter.  Labs: Lab Results  Component Value Date   HGBA1C 9.0 (A) 09/09/2021   HGBA1C 7.8 (A) 05/06/2021   HGBA1C 8.1 (H) 08/12/2020   ESRSEDRATE 51 (H) 12/24/2019   ESRSEDRATE 36 (H) 02/24/2017   CRP 13.2 (H) 12/24/2019   CRP 1.2 (H) 02/24/2017   LABURIC 5.5 09/01/2015   REPTSTATUS 06/03/2020 FINAL 05/29/2020   CULT  05/29/2020    NO GROWTH 5 DAYS Performed at [facility], [place] [address] [place], [place] [address]     LABORGA GROUP B STREP (S.AGALACTIAE) ISOLATED 12/26/2014     Lab Results  Component Value Date   ALBUMIN 3.8 09/08/2020   ALBUMIN 3.4 (L) 05/29/2020   ALBUMIN 3.6 12/26/2019   PREALBUMIN 19.4 03/05/2012    No results found for: "MG" No results found for: "VD25OH"  Lab Results  Component Value Date   PREALBUMIN 19.4 03/05/2012      Latest Ref Rng & Units 10/06/2021    8:10 AM 08/05/2021    8:02 AM 06/02/2021    7:55 AM  CBC EXTENDED  WBC 4.0 - 10.5 K/uL 9.9  11.6  10.8   RBC 3.87 - 5.11 MIL/uL 4.14  4.20  4.22   Hemoglobin 12.0 - 15.0 g/dL 12.0  12.5  12.2   HCT 36.0 - 46.0 % 36.7  37.2  36.9   Platelets 150 - 400 K/uL 403  360  361   NEUT# 1.7 - 7.7 K/uL 6.4  7.8  7.4   Lymph# 0.7 - 4.0 K/uL 2.4  2.7  2.5      There is no height or weight on file to calculate BMI.  Orders:  No orders of the defined types were placed in this encounter.  No orders of the defined types were placed in this encounter.  Procedures: No procedures performed  Clinical Data: No additional findings.  ROS:  All other systems negative, except as noted in the HPI. Review of Systems  Objective: Vital Signs: There were no vitals taken for this visit.  Specialty Comments:  No specialty comments available.  PMFS History: Patient Active Problem List   Diagnosis Date Noted   Coccyx pain 12/18/2020   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[place]) 10/28/2020   Memory loss 11/21/2019   Mild nonproliferative diabetic retinopathy of both eyes ([place]) 10/23/2019   Nuclear sclerotic cataract of both eyes 10/23/2019   Posterior vitreous detachment of right eye 10/23/2019   Retinal hemorrhage of left eye 10/23/2019   Impingement syndrome of right shoulder 07/06/2017   Iron deficiency anemia 05/11/2017   Morbid obesity ([place]) 04/24/2017   Anemia 02/24/2017   Baker's cyst 07/10/2016   Onychomycosis 12/07/2015   Upper airway cough syndrome 03/05/2014   Hypertension associated with  diabetes ([place]) 12/05/2006   T2DM (type 2 diabetes mellitus) ([place]) 10/12/2006   Dyslipidemia associated with type 2 diabetes mellitus ([place]) 10/12/2006   Allergic rhinitis 10/12/2006   GERD - Followed by Dr. Fuller Plan 10/12/2006   IRRITABLE BOWEL SYNDROME - followed by Dr. Fuller Plan 10/12/2006   Peripheral neuropathy 10/11/2006   ALOPECIA NEC 10/11/2006   Past Medical History:  Diagnosis Date   Alopecia    Anemia, mild    Arthritis    Chronic cough    sees pulmonologist   Chronic pain    chest wall and abd - s/p extensive eval   Diabetes mellitus with neuropathy (HCC)    sees endocrine   Diverticulosis    Dyspnea    Fatty liver    GERD (gastroesophageal reflux disease)    takes Nexium bid, hx erosive esophagitis   Headache(784.0)    occasionally;r/t sinus    History of colon polyps    HTN (hypertension)    Hx of amputation of lesser toe (HCC)    sees podiatrist   Hyperlipemia    IBS (irritable bowel syndrome)    Insomnia    takes Elavil nightly   Joint pain    Neuropathy    Neuropathy    Osteomyelitis ([place])    Pneumonia    89/2/22- patient denies   PONV (postoperative nausea and vomiting)    medication did not help with surgery on 03/28/20   Seasonal allergies    takes Zyrtec daily   Sinus tachycardia     Family History  Problem Relation Age of Onset   Lung cancer Mother 27       smoked heavily   Emphysema Mother    Hypertension Father    Hyperlipidemia Father    Diabetes Father    Coronary artery disease Father    Dementia Father    COPD Father        smoked   Stomach cancer Paternal Aunt    Brain cancer Paternal Uncle    Irritable bowel syndrome Other        Several family members on fathers side    Diabetes Other    Stomach cancer Maternal Aunt    Lung cancer Paternal Uncle    Heart disease Paternal Uncle    Colon cancer Neg Hx     Past Surgical History:  Procedure Laterality Date   AMPUTATION  12/28/2011   Procedure: AMPUTATION DIGIT;  Surgeon: Newt Minion, MD;  Location: [facility];  Service: Orthopedics;  Laterality: Right;  Right Foot 2nd Toe Amputation at  MTP (metatarsophalangeal joint)   AMPUTATION Right 04/25/2012   Procedure: Right Foot 3rd Toe Amputation;  Surgeon: Newt Minion, MD;  Location: [facility];  Service: Orthopedics;  Laterality: Right;  Right Foot Third Toe Amputation    AMPUTATION Right 07/27/2012   Procedure: Right 4th Toe Amputation at Metatarsophalangeal;  Surgeon: Newt Minion, MD;  Location: [facility];  Service: Orthopedics;  Laterality: Right;  Right 4th Toe Amputation at Metatarsophalangeal   AMPUTATION Left 07/15/2016   Procedure: Left 2nd Ray Amputation;  Surgeon: Newt Minion, MD;  Location: [facility];  Service: Orthopedics;  Laterality: Left;   AMPUTATION Left 11/18/2016   Procedure: Left 3rd and 4th Ray Amputation;  Surgeon: Newt Minion, MD;  Location: [facility];  Service: Orthopedics;  Laterality: Left;   AMPUTATION Right 03/29/2017   Procedure: RIGHT FOOT 3RD AND 4TH RAY AMPUTATION;  Surgeon: Newt Minion, MD;  Location: [facility];  Service: Orthopedics;  Laterality: Right;   AMPUTATION Left 08/12/2020   Procedure: LEFT TRANSMETATARSAL AMPUTATION;  Surgeon: Newt Minion, MD;  Location: [facility];  Service: Orthopedics;  Laterality: Left;   APPLICATION OF WOUND VAC Left 08/12/2020   Procedure: APPLICATION OF WOUND VAC;  Surgeon: Newt Minion, MD;  Location: [facility];  Service: Orthopedics;  Laterality: Left;   COLONOSCOPY     LAPAROSCOPIC APPENDECTOMY  01/05/2011   Procedure: APPENDECTOMY LAPAROSCOPIC;  Surgeon: Pedro Earls, MD;  [facility];  Service: General;  Laterality: N/A;   OOPHORECTOMY  2001   ROTATOR CUFF REPAIR Right    x 2   TUBAL LIGATION     VAGINAL HYSTERECTOMY  2001   Social History   Occupational History   Occupation: Retired   Tobacco Use   Smoking status: Never   Smokeless tobacco: Never  Vaping Use   Vaping Use: Never used  Substance and Sexual Activity   Alcohol use: Never   Drug use: Never    Sexual activity: Yes    Birth control/protection: Surgical

## 2021-11-02 NOTE — Telephone Encounter (Signed)
See note

## 2021-11-04 ENCOUNTER — Other Ambulatory Visit: Payer: Self-pay | Admitting: *Deleted

## 2021-11-04 DIAGNOSIS — E1159 Type 2 diabetes mellitus with other circulatory complications: Secondary | ICD-10-CM

## 2021-11-04 DIAGNOSIS — R7989 Other specified abnormal findings of blood chemistry: Secondary | ICD-10-CM

## 2021-11-04 DIAGNOSIS — Z794 Long term (current) use of insulin: Secondary | ICD-10-CM

## 2021-11-04 DIAGNOSIS — I152 Hypertension secondary to endocrine disorders: Secondary | ICD-10-CM

## 2021-11-04 DIAGNOSIS — E1169 Type 2 diabetes mellitus with other specified complication: Secondary | ICD-10-CM

## 2021-11-04 NOTE — Telephone Encounter (Signed)
Patient requests to be called at ph# [phone] asap re: Patient states she needs to have prior labs done 11/05/21 because she is going out of town and her CPE is 11/10/21.  Patient states she did not receive response to MyChart message re: the above

## 2021-11-04 NOTE — Telephone Encounter (Signed)
Labs order Patient notified, lab appointment schedule for 11/05/2021

## 2021-11-04 NOTE — Telephone Encounter (Signed)
This should not impact her labs.  Beth Holden. Jerline Pain, MD 11/04/2021 12:47 PM

## 2021-11-05 ENCOUNTER — Other Ambulatory Visit (INDEPENDENT_AMBULATORY_CARE_PROVIDER_SITE_OTHER): Payer: Medicare Other

## 2021-11-05 DIAGNOSIS — E1159 Type 2 diabetes mellitus with other circulatory complications: Secondary | ICD-10-CM | POA: Diagnosis not present

## 2021-11-05 DIAGNOSIS — E1169 Type 2 diabetes mellitus with other specified complication: Secondary | ICD-10-CM | POA: Diagnosis not present

## 2021-11-05 DIAGNOSIS — E785 Hyperlipidemia, unspecified: Secondary | ICD-10-CM | POA: Diagnosis not present

## 2021-11-05 DIAGNOSIS — Z794 Long term (current) use of insulin: Secondary | ICD-10-CM

## 2021-11-05 DIAGNOSIS — I152 Hypertension secondary to endocrine disorders: Secondary | ICD-10-CM

## 2021-11-05 LAB — COMPREHENSIVE METABOLIC PANEL
ALT: 11 U/L (ref 0–35)
AST: 15 U/L (ref 0–37)
Albumin: 4.1 g/dL (ref 3.5–5.2)
Alkaline Phosphatase: 40 U/L (ref 39–117)
BUN: 23 mg/dL (ref 6–23)
CO2: 31 mEq/L (ref 19–32)
Calcium: 9.7 mg/dL (ref 8.4–10.5)
Chloride: 100 mEq/L (ref 96–112)
Creatinine, Ser: 0.98 mg/dL (ref 0.40–1.20)
GFR: 57.03 mL/min — ABNORMAL LOW (ref >60.00)
Glucose, Bld: 116 mg/dL — ABNORMAL HIGH (ref 70–99)
Potassium: 3.8 mEq/L (ref 3.5–5.1)
Sodium: 140 mEq/L (ref 135–145)
Total Bilirubin: 0.4 mg/dL (ref 0.2–1.2)
Total Protein: 7.1 g/dL (ref 6.0–8.3)

## 2021-11-05 LAB — CBC
HCT: 35.9 % — ABNORMAL LOW (ref 36.0–46.0)
Hemoglobin: 11.8 g/dL — ABNORMAL LOW (ref 12.0–15.0)
MCHC: 32.8 g/dL (ref 30.0–36.0)
MCV: 87 fl (ref 78.0–100.0)
Platelets: 364 10*3/uL (ref 150.0–400.0)
RBC: 4.13 Mil/uL (ref 3.87–5.11)
RDW: 13.7 % (ref 11.5–15.5)
WBC: 11.7 10*3/uL — ABNORMAL HIGH (ref 4.0–10.5)

## 2021-11-05 LAB — RENAL FUNCTION PANEL
Albumin: 4.1 g/dL (ref 3.5–5.2)
BUN: 23 mg/dL (ref 6–23)
CO2: 31 mEq/L (ref 19–32)
Calcium: 9.7 mg/dL (ref 8.4–10.5)
Chloride: 100 mEq/L (ref 96–112)
Creatinine, Ser: 0.98 mg/dL (ref 0.40–1.20)
GFR: 57.03 mL/min — ABNORMAL LOW (ref >60.00)
Glucose, Bld: 116 mg/dL — ABNORMAL HIGH (ref 70–99)
Phosphorus: 3.7 mg/dL (ref 2.3–4.6)
Potassium: 3.8 mEq/L (ref 3.5–5.1)
Sodium: 140 mEq/L (ref 135–145)

## 2021-11-05 LAB — LIPID PANEL
Cholesterol: 122 mg/dL (ref 0–200)
HDL: 39.9 mg/dL (ref >39.00)
LDL Cholesterol: 50 mg/dL (ref 0–99)
NonHDL: 82.02
Total CHOL/HDL Ratio: 3
Triglycerides: 159 mg/dL — ABNORMAL HIGH (ref 0.0–149.0)
VLDL: 31.8 mg/dL (ref 0.0–40.0)

## 2021-11-05 LAB — TSH: TSH: 4.14 u[IU]/mL (ref 0.35–5.50)

## 2021-11-09 NOTE — Progress Notes (Signed)
Please inform patient of the following:  Great news! Labs are all stable.  Do not need to make any changes to treatment plan at this time.  We can discuss more at her upcoming office visit.

## 2021-11-10 ENCOUNTER — Other Ambulatory Visit: Payer: Self-pay | Admitting: Family Medicine

## 2021-11-10 ENCOUNTER — Encounter: Payer: Self-pay | Admitting: Family Medicine

## 2021-11-10 ENCOUNTER — Ambulatory Visit (INDEPENDENT_AMBULATORY_CARE_PROVIDER_SITE_OTHER): Payer: Medicare Other | Admitting: Family Medicine

## 2021-11-10 VITALS — BP 119/73 | HR 67 | Temp 98.2°F | Ht 66.0 in | Wt 228.0 lb

## 2021-11-10 DIAGNOSIS — I152 Hypertension secondary to endocrine disorders: Secondary | ICD-10-CM | POA: Diagnosis not present

## 2021-11-10 DIAGNOSIS — J309 Allergic rhinitis, unspecified: Secondary | ICD-10-CM | POA: Diagnosis not present

## 2021-11-10 DIAGNOSIS — G3184 Mild cognitive impairment, so stated: Secondary | ICD-10-CM | POA: Diagnosis not present

## 2021-11-10 DIAGNOSIS — E2839 Other primary ovarian failure: Secondary | ICD-10-CM

## 2021-11-10 DIAGNOSIS — Z794 Long term (current) use of insulin: Secondary | ICD-10-CM | POA: Diagnosis not present

## 2021-11-10 DIAGNOSIS — E1169 Type 2 diabetes mellitus with other specified complication: Secondary | ICD-10-CM

## 2021-11-10 DIAGNOSIS — N183 Chronic kidney disease, stage 3 unspecified: Secondary | ICD-10-CM | POA: Insufficient documentation

## 2021-11-10 DIAGNOSIS — E785 Hyperlipidemia, unspecified: Secondary | ICD-10-CM

## 2021-11-10 DIAGNOSIS — Z23 Encounter for immunization: Secondary | ICD-10-CM | POA: Diagnosis not present

## 2021-11-10 DIAGNOSIS — E1122 Type 2 diabetes mellitus with diabetic chronic kidney disease: Secondary | ICD-10-CM

## 2021-11-10 DIAGNOSIS — Z0000 Encounter for general adult medical examination without abnormal findings: Secondary | ICD-10-CM

## 2021-11-10 DIAGNOSIS — E1159 Type 2 diabetes mellitus with other circulatory complications: Secondary | ICD-10-CM

## 2021-11-10 MED ORDER — FLUOCINOLONE ACETONIDE 0.01 % OT OIL: TOPICAL_OIL | OTIC | 0 refills | Status: DC

## 2021-11-10 MED ORDER — AZELASTINE HCL 0.1 % NA SOLN: 2.0000 | Freq: Two times a day (BID) | NASAL | 12 refills | Status: AC

## 2021-11-10 NOTE — Progress Notes (Signed)
Chief Complaint:  Beth Holden is a 76 y.o. female who presents today for a subsequent Medicare Annual Wellness Visit and to discuss management of her chronic medical problems.  Assessment/Plan:  New/Acute Problems: Covid Recovered.  Still has occasional cough.  She can use Tessalon as needed.  We discussed reasons to return to care.  Stye No red flags.  Continue warm compresses.  She likely has some underlying allergic conjunctivitis as well.  She will start Pataday drops.  Reasons to return to care were discussed.  Chronic Problems Addressed Today: Hypertension associated with diabetes ([place]) Blood pressure at goal today on irbesartan-HCTZ 150-12.5 once daily metoprolol succinate 25 mg daily.  Dyslipidemia associated with type 2 diabetes mellitus (HCC) Lipids at goal on Crestor 10 mg daily.  T2DM (type 2 diabetes mellitus) ([place]) Follows with endocrinology.  Last A1c 9.0.  Mild cognitive impairment Mini cog with 2 out of 3 delayed word recall.  Normal clock face.  We did discuss referral to neurology for further evaluation management however deferred for now.  We discussed keeping mind active with puzzles and other activities.  CKD stage 3 due to type 2 diabetes mellitus (HCC) GFR stable on most recent be met.  We will continue to check once or twice yearly.  Avoid nephrotoxic medications.  She is on ARB.  Allergic rhinitis We will refill Astelin.  She will continue taking Zyrtec.   Preventative Healthcare Health Maintenance Due  Topic Date Due   DEXA SCAN  12/24/2017   Medicare Annual Wellness (AWV)  11/13/2019   Diabetic kidney evaluation - Urine ACR  12/18/2020  Flu shot given today. She can get shingles and tetanus at the pharmacy. DEXA ordered.  Due for colon cancer screening next year.  During the course of the visit the patient was educated and counseled about appropriate screening and preventive services including:        Fall  prevention   Nutrition Physical Activity Weight Management Cognition    Subjective:  HPI:  Health Risk Assessment: Patient considers her overall health to be good. He has no difficulty performing the following: Preparing food and eating Bathing  Getting dressed Using the toilet Shopping Managing Finances Moving around from place to place  She is concerned about memory loss. This has been going on for years. Seems to be stable. She has more difficulty with short term memory  She did have covid a few weeks ago.  Seems to be recovering normally.  Does have occasional cough.  She also has a stye in her left eye for last few weeks.  She has not had any falls within the past year.      11/10/2021    8:11 AM  Depression screen PHQ 2/9  Decreased Interest 0  Down, Depressed, Hopeless 0  PHQ - 2 Score 0    Lifestyle Factors: Diet: Trying to cut down on carbs and sweets.  Exercise: Limited.   Patient Care Team: Vivi Barrack, MD as PCP - General (Family Medicine) Fay Records, MD as PCP - Cardiology (Cardiology) Petrinitz, Dellis Filbert, Ebony (Inactive) (Podiatry) Ladene Artist, MD as Consulting Physician (Gastroenterology) Philemon Kingdom, MD as Consulting Physician (Internal Medicine) Festus Aloe, MD as Consulting Physician (Urology) Jovita Gamma, MD as Consulting Physician (Neurosurgery) Newt Minion, MD as Consulting Physician (Orthopedic Surgery)   ROS: Per HPI, otherwise a complete review of systems was negative.   PMH:  The following were reviewed and entered/updated in epic: Past Medical History:  Diagnosis Date   Alopecia    Anemia, mild    Arthritis    Chronic cough    sees pulmonologist   Chronic pain    chest wall and abd - s/p extensive eval   Diabetes mellitus with neuropathy ([place])    sees endocrine   Diverticulosis    Dyspnea    Fatty liver    GERD (gastroesophageal reflux disease)    takes Nexium bid, hx erosive esophagitis    Headache(784.0)    occasionally;r/t sinus    History of colon polyps    HTN (hypertension)    Hx of amputation of lesser toe (HCC)    sees podiatrist   Hyperlipemia    IBS (irritable bowel syndrome)    Insomnia    takes Elavil nightly   Joint pain    Neuropathy    Neuropathy    Osteomyelitis ([place])    Pneumonia    89/2/22- patient denies   PONV (postoperative nausea and vomiting)    medication did not help with surgery on 03/28/20   Seasonal allergies    takes Zyrtec daily   Sinus tachycardia    Patient Active Problem List   Diagnosis Date Noted   CKD stage 3 due to type 2 diabetes mellitus ([place]) 11/10/2021   Mild cognitive impairment 11/10/2021   Coccyx pain 12/18/2020   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[place]) 10/28/2020   Memory loss 11/21/2019   Mild nonproliferative diabetic retinopathy of both eyes ([place]) 10/23/2019   Nuclear sclerotic cataract of both eyes 10/23/2019   Posterior vitreous detachment of right eye 10/23/2019   Retinal hemorrhage of left eye 10/23/2019   Impingement syndrome of right shoulder 07/06/2017   Iron deficiency anemia 05/11/2017   Morbid obesity ([place]) 04/24/2017   Anemia 02/24/2017   Baker's cyst 07/10/2016   Onychomycosis 12/07/2015   Upper airway cough syndrome 03/05/2014   Hypertension associated with diabetes ([place]) 12/05/2006   T2DM (type 2 diabetes mellitus) ([place]) 10/12/2006   Dyslipidemia associated with type 2 diabetes mellitus ([place]) 10/12/2006   Allergic rhinitis 10/12/2006   GERD - Followed by Dr. Fuller Plan 10/12/2006   IRRITABLE BOWEL SYNDROME - followed by Dr. Fuller Plan 10/12/2006   Peripheral neuropathy 10/11/2006   ALOPECIA NEC 10/11/2006   Past Surgical History:  Procedure Laterality Date   AMPUTATION  12/28/2011   Procedure: AMPUTATION DIGIT;  Surgeon: Newt Minion, MD;  Location: [facility];  Service: Orthopedics;  Laterality: Right;  Right Foot 2nd Toe Amputation at MTP (metatarsophalangeal joint)   AMPUTATION Right  04/25/2012   Procedure: Right Foot 3rd Toe Amputation;  Surgeon: Newt Minion, MD;  Location: [facility];  Service: Orthopedics;  Laterality: Right;  Right Foot Third Toe Amputation    AMPUTATION Right 07/27/2012   Procedure: Right 4th Toe Amputation at Metatarsophalangeal;  Surgeon: Newt Minion, MD;  Location: [facility];  Service: Orthopedics;  Laterality: Right;  Right 4th Toe Amputation at Metatarsophalangeal   AMPUTATION Left 07/15/2016   Procedure: Left 2nd Ray Amputation;  Surgeon: Newt Minion, MD;  Location: [facility];  Service: Orthopedics;  Laterality: Left;   AMPUTATION Left 11/18/2016   Procedure: Left 3rd and 4th Ray Amputation;  Surgeon: Newt Minion, MD;  Location: [facility];  Service: Orthopedics;  Laterality: Left;   AMPUTATION Right 03/29/2017   Procedure: RIGHT FOOT 3RD AND 4TH RAY AMPUTATION;  Surgeon: Newt Minion, MD;  Location: [facility];  Service: Orthopedics;  Laterality: Right;   AMPUTATION Left 08/12/2020  Procedure: LEFT TRANSMETATARSAL AMPUTATION;  Surgeon: Newt Minion, MD;  Location: [facility];  Service: Orthopedics;  Laterality: Left;   APPLICATION OF WOUND VAC Left 08/12/2020   Procedure: APPLICATION OF WOUND VAC;  Surgeon: Newt Minion, MD;  Location: [facility];  Service: Orthopedics;  Laterality: Left;   COLONOSCOPY     LAPAROSCOPIC APPENDECTOMY  01/05/2011   Procedure: APPENDECTOMY LAPAROSCOPIC;  Surgeon: Pedro Earls, MD;  [facility];  Service: General;  Laterality: N/A;   OOPHORECTOMY  2001   ROTATOR CUFF REPAIR Right    x 2   TUBAL LIGATION     VAGINAL HYSTERECTOMY  2001    Family History  Problem Relation Age of Onset   Lung cancer Mother 38       smoked heavily   Emphysema Mother    Hypertension Father    Hyperlipidemia Father    Diabetes Father    Coronary artery disease Father    Dementia Father    COPD Father        smoked   Stomach cancer Paternal Aunt    Brain cancer Paternal Uncle    Irritable bowel syndrome Other        Several family members on  fathers side    Diabetes Other    Stomach cancer Maternal Aunt    Lung cancer Paternal Uncle    Heart disease Paternal Uncle    Colon cancer Neg Hx     Medications- reviewed and updated Current Outpatient Medications  Medication Sig Dispense Refill   acetaminophen (TYLENOL) 500 MG tablet Take 500 mg by mouth every 8 (eight) hours as needed for mild pain.      benzonatate (TESSALON PERLES) 100 MG capsule 1-2 capsules up to twice daily as needed for cough 30 capsule 0   betamethasone dipropionate (DIPROLENE) 0.05 % cream Apply 1 application topically 2 (two) times daily as needed (IRRITATION). 30 g 0   Blood Glucose Monitoring Suppl (ONE TOUCH ULTRA 2) w/Device KIT Use to check blood sugar 1 kit 0   cetirizine (ZYRTEC) 10 MG tablet Take 1 tablet (10 mg total) by mouth daily. 90 tablet 3   cholecalciferol (VITAMIN D) 1000 units tablet Take 1,000 Units by mouth daily.     gabapentin (NEURONTIN) 100 MG capsule TAKE 2 TO 3 CAPSULES AT    BEDTIME 180 capsule 3   glucagon (GLUCAGEN) 1 MG SOLR injection Inject 1 mg into the muscle once as needed for up to 1 dose for low blood sugar. 1 each 11   insulin degludec (TRESIBA FLEXTOUCH) 200 UNIT/ML FlexTouch Pen Inject 34 Units into the skin daily. 12 mL 3   Insulin Pen Needle 32G X 4 MM MISC Use 1x a day 100 each 3   insulin regular (NOVOLIN R RELION) 100 units/mL injection Inject 0.2-0.3 mLs (20-30 Units total) into the skin 3 (three) times daily before meals. Per sliding scale     Insulin Syringe-Needle U-100 (RELION INSULIN SYRINGE 1ML/31G) 31G X 5/16" 1 ML MISC USE 2 TIMES A DAY (Patient taking differently: 1 each by Other route 2 (two) times daily.) 100 each 2   irbesartan-hydrochlorothiazide (AVALIDE) 150-12.5 MG tablet Take 1 tablet by mouth daily. 90 tablet 3   metFORMIN (GLUCOPHAGE) 1000 MG tablet Take 1 tablet (1,000 mg total) by mouth 2 (two) times daily with a meal. 180 tablet 3   metoprolol succinate (TOPROL-XL) 25 MG 24 hr tablet Take 1  tablet (25 mg total) by mouth daily. 90 tablet 2  omeprazole (PRILOSEC) 40 MG capsule Take 1 capsule (40 mg total) by mouth 2 (two) times daily. 180 capsule 3   ONETOUCH ULTRA test strip USE  STRIP TO CHECK GLUCOSE THREE TIMES DAILY 300 each 0   OVER THE COUNTER MEDICATION Apply 1-3 application topically at bedtime as needed (foot pain). TOPRICIN FOOT CREAM - NEUROPATHY FOOT CREAM **APPLIES TO BOTH FEET AT BEDTIME**     Probiotic Product (PROBIOTIC DAILY PO) Take 1 capsule by mouth daily.     rosuvastatin (CRESTOR) 20 MG tablet Take 1 tablet (20 mg total) by mouth daily. 90 tablet 3   vitamin B-12 (CYANOCOBALAMIN) 1000 MCG tablet Take 1,000 mcg daily by mouth.     vitamin C (ASCORBIC ACID) 500 MG tablet Take 500 mg by mouth daily.     azelastine (ASTELIN) 0.1 % nasal spray Place 2 sprays into both nostrils 2 (two) times daily. 30 mL 12   Fluocinolone Acetonide 0.01 % OIL Use 1-2 drops daily as needed. 20 mL 0   No current facility-administered medications for this visit.    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   Codeine Other (See Comments)    Makes her crazy   Propoxyphene Hcl Itching    *DARVOCET     Social History   Socioeconomic History   Marital status: Married    Spouse name: Not on file   Number of children: 2   Years of education: Not on file   Highest education level: Not on file  Occupational History   Occupation: Retired   Tobacco Use   Smoking status: Never   Smokeless tobacco: Never  Vaping Use   Vaping Use: Never used  Substance and Sexual Activity   Alcohol use: Never   Drug use: Never   Sexual activity: Yes    Birth control/protection: Surgical  Other Topics Concern   Not on file  Social History Narrative   Caffeine daily    [employer], [employer] no diploma   Married '66   1 dtr- '78; 1 son '71; 2 grandchildren   Occupation: retired 04   Dad with alzheimers-had to place in  [place] (summer '10)         Social Determinants of Radio broadcast assistant Strain: Not on Art therapist Insecurity: Not on file  Transportation Needs: Not on file  Physical Activity: Not on file  Stress: Not on file  Social Connections: Not on file         Objective/Observations  Physical Exam: BP 119/73   Pulse 67   Temp 98.2 F (36.8 C) (Temporal)   Ht _0  (1.676 m)   Wt 228 lb (103.4 kg)   SpO2 93%   BMI 36.80 kg/m  Gen: NAD, resting comfortably HEENT: TMs normal bilaterally. OP clear. No thyromegaly noted.  CV: RRR with no murmurs appreciated Pulm: NWOB, CTAB with no crackles, wheezes, or rhonchi GI: Normal bowel sounds present. Soft, Nontender, Nondistended. MSK: no edema, cyanosis, or clubbing noted Skin: warm, dry Neuro: CN2-12 grossly intact. Strength 5/5 in upper and lower extremities. Reflexes symmetric and intact bilaterally. Ajo with 2/3 delayed word recall. Psych: Normal affect and thought content      Gonsalo Cuthbertson M. Jerline Pain, MD 11/10/2021 8:51 AM

## 2021-11-10 NOTE — Assessment & Plan Note (Signed)
GFR stable on most recent be met.  We will continue to check once or twice yearly.  Avoid nephrotoxic medications.  She is on ARB. 

## 2021-11-10 NOTE — Assessment & Plan Note (Signed)
Lipids at goal on Crestor 10 mg daily.

## 2021-11-10 NOTE — Assessment & Plan Note (Signed)
Blood pressure at goal today on irbesartan-HCTZ 150-12.5 once daily metoprolol succinate 25 mg daily.

## 2021-11-10 NOTE — Patient Instructions (Addendum)
It was very nice to see you today!  Please try the pataday drops to help with your eyes.   I will refill your other medications to your pharmacy.  We will see you back in year for your next physical.  Come back sooner if needed.  Take care, Dr Jerline Pain  PLEASE NOTE:  If you had any lab tests please let us know if you have not heard back within a few days. You may see your results on mychart before we have a chance to review them but we will give you a call once they are reviewed by [place]. If we ordered any referrals today, please let us know if you have not heard from their office within the next week.   Please try these tips to maintain a healthy lifestyle:  Eat at least 3 REAL meals and 1-2 snacks per day.  Aim for no more than 5 hours between eating.  If you eat breakfast, please do so within one hour of getting up.   Each meal should contain half fruits/vegetables, one quarter protein, and one quarter carbs (no bigger than a computer mouse)  Cut down on sweet beverages. This includes juice, soda, and sweet tea.   Drink at least 1 glass of water with each meal and aim for at least 8 glasses per day  Exercise at least 150 minutes every week.

## 2021-11-10 NOTE — Assessment & Plan Note (Signed)
Follows with endocrinology.  Last A1c 9.0.

## 2021-11-10 NOTE — Assessment & Plan Note (Signed)
We will refill Astelin.  She will continue taking Zyrtec.

## 2021-11-10 NOTE — Assessment & Plan Note (Signed)
Mini cog with 2 out of 3 delayed word recall.  Normal clock face.  We did discuss referral to neurology for further evaluation management however deferred for now.  We discussed keeping mind active with puzzles and other activities.

## 2021-11-12 ENCOUNTER — Ambulatory Visit (INDEPENDENT_AMBULATORY_CARE_PROVIDER_SITE_OTHER)
Admission: RE | Admit: 2021-11-12 | Discharge: 2021-11-12 | Disposition: A | Discharge status: Home / Self Care | Payer: Medicare Other | Source: Ambulatory Visit | Attending: Family Medicine | Admitting: Family Medicine

## 2021-11-12 ENCOUNTER — Other Ambulatory Visit: Payer: Self-pay | Admitting: Family Medicine

## 2021-11-12 DIAGNOSIS — E2839 Other primary ovarian failure: Secondary | ICD-10-CM | POA: Diagnosis not present

## 2021-11-12 DIAGNOSIS — Z1231 Encounter for screening mammogram for malignant neoplasm of breast: Secondary | ICD-10-CM

## 2021-11-16 ENCOUNTER — Encounter: Payer: Self-pay | Admitting: Family Medicine

## 2021-11-17 ENCOUNTER — Telehealth: Payer: Self-pay | Admitting: Family Medicine

## 2021-11-17 NOTE — Telephone Encounter (Signed)
Caller is Robin, Software engineer from [facility] states: -Needed clarification on patient's most recent medication, Fluocinolone Acetonide 0.01 % OIL   - He needs to know if they need to fill it as body, scalp, or otic oil   For additional questions, Shirlean Mylar can be reached at [phone]. Reference number is [id number]

## 2021-11-17 NOTE — Progress Notes (Signed)
Please inform patient of the following:  CT scan shows that she has osteo pia which is mild thinning of the bone.  She may benefit from starting a medication to improve her bone density and prevent risk of fracture.  She should continue calcium and vitamin D intake as well.  Recommend she schedule appointment to discuss starting medication.

## 2021-11-18 ENCOUNTER — Encounter: Payer: Self-pay | Admitting: Family Medicine

## 2021-11-18 ENCOUNTER — Telehealth (INDEPENDENT_AMBULATORY_CARE_PROVIDER_SITE_OTHER): Payer: Medicare Other | Admitting: Family Medicine

## 2021-11-18 ENCOUNTER — Other Ambulatory Visit: Payer: Self-pay | Admitting: *Deleted

## 2021-11-18 VITALS — Ht 66.0 in | Wt 228.0 lb

## 2021-11-18 DIAGNOSIS — J309 Allergic rhinitis, unspecified: Secondary | ICD-10-CM

## 2021-11-18 DIAGNOSIS — M8580 Other specified disorders of bone density and structure, unspecified site: Secondary | ICD-10-CM | POA: Diagnosis not present

## 2021-11-18 MED ORDER — FLUOCINOLONE ACETONIDE 0.01 % OT OIL: 1.0000 [drp] | TOPICAL_OIL | Freq: Every day | OTIC | 0 refills | Status: AC

## 2021-11-18 MED ORDER — CETIRIZINE HCL 10 MG PO TABS: 10.0000 mg | ORAL_TABLET | Freq: Every day | ORAL | 3 refills | Status: DC

## 2021-11-18 MED ORDER — CALCIUM CARB-CHOLECALCIFEROL 600-10 MG-MCG PO TABS: 2.0000 | ORAL_TABLET | Freq: Every day | ORAL | 3 refills | Status: AC

## 2021-11-18 NOTE — Assessment & Plan Note (Signed)
Stable on Zyrtec.  Will refill today.

## 2021-11-18 NOTE — Telephone Encounter (Signed)
Rx resend with sig

## 2021-11-18 NOTE — Progress Notes (Signed)
   Beth Holden is a 76 y.o. female who presents today for a virtual office visit.  Assessment/Plan:  2Chronic Problems Addressed Today: Osteopenia Last DEXA 11/23 Discussed treatment options with patient.  She has not been on calcium and vitamin D supplementation.  She will start with at least 800 IUs of vitamin D and 1200 mg of calcium daily.  We did discuss potentially starting bisphosphonate due to her increased risk for hip fracture over the next 10 years at 3.2% however elected to try with calcium vitamin D supplementation as above post.  She does have a lot of underlying reflux and GERD and is concerned about potential side effects of bisphosphonate.  We will recheck DEXA scan in 1 to 2 years.  Allergic rhinitis Stable on Zyrtec.  Will refill today.     Subjective:  HPI:  See A/P for status of chronic condition.  Patient is here to follow-up on most recent DEXA scan results.  Had DEXA scan done last week showed T score of -1.7 at right femoral neck and -0.1 at lumbar spine.  10-year risk factor for major fracture with 16.6% and 10-year risk fracture for hip fracture 3.2%.       Objective/Observations  Physical Exam: Gen: NAD, resting comfortably Pulm: Normal work of breathing Neuro: Grossly normal, moves all extremities Psych: Normal affect and thought content  Virtual Visit via Video   I connected with BRELAND ELDERS on 11/18/21 at 11:40 AM EST by a video enabled telemedicine application and verified that I am speaking with the correct person using two identifiers. The limitations of evaluation and management by telemedicine and the availability of in person appointments were discussed. The patient expressed understanding and agreed to proceed.   Patient location: Home Provider location: [place] participating in the virtual visit: Myself and Patient     Algis Greenhouse. Jerline Pain, MD 11/18/2021 12:43 PM

## 2021-11-18 NOTE — Assessment & Plan Note (Signed)
Discussed treatment options with patient.  She has not been on calcium and vitamin D supplementation.  She will start with at least 800 IUs of vitamin D and 1200 mg of calcium daily.  We did discuss potentially starting bisphosphonate due to her increased risk for hip fracture over the next 10 years at 3.2% however elected to try with calcium vitamin D supplementation as above post.  She does have a lot of underlying reflux and GERD and is concerned about potential side effects of bisphosphonate.  We will recheck DEXA scan in 1 to 2 years.

## 2021-11-18 NOTE — Telephone Encounter (Signed)
Otic oil.  Algis Greenhouse. Jerline Pain, MD 11/18/2021 7:51 AM

## 2021-11-18 NOTE — Telephone Encounter (Signed)
Please advise 

## 2021-11-22 ENCOUNTER — Encounter (INDEPENDENT_AMBULATORY_CARE_PROVIDER_SITE_OTHER): Payer: Medicare Other | Admitting: Ophthalmology

## 2021-11-30 ENCOUNTER — Ambulatory Visit (INDEPENDENT_AMBULATORY_CARE_PROVIDER_SITE_OTHER): Payer: Medicare Other | Admitting: Orthopedic Surgery

## 2021-11-30 DIAGNOSIS — L97521 Non-pressure chronic ulcer of other part of left foot limited to breakdown of skin: Secondary | ICD-10-CM

## 2021-11-30 DIAGNOSIS — L97511 Non-pressure chronic ulcer of other part of right foot limited to breakdown of skin: Secondary | ICD-10-CM | POA: Diagnosis not present

## 2021-11-30 DIAGNOSIS — M7541 Impingement syndrome of right shoulder: Secondary | ICD-10-CM | POA: Diagnosis not present

## 2021-11-30 DIAGNOSIS — Z89432 Acquired absence of left foot: Secondary | ICD-10-CM

## 2021-12-02 ENCOUNTER — Other Ambulatory Visit: Payer: Self-pay | Admitting: Internal Medicine

## 2021-12-02 DIAGNOSIS — E1165 Type 2 diabetes mellitus with hyperglycemia: Secondary | ICD-10-CM

## 2021-12-03 ENCOUNTER — Encounter: Payer: Self-pay | Admitting: Orthopedic Surgery

## 2021-12-03 DIAGNOSIS — M7541 Impingement syndrome of right shoulder: Secondary | ICD-10-CM | POA: Diagnosis not present

## 2021-12-03 MED ORDER — LIDOCAINE HCL 1 % IJ SOLN
5.0000 mL | INTRAMUSCULAR | Status: AC | PRN
  Administered 2021-12-03: 5 mL

## 2021-12-03 MED ORDER — METHYLPREDNISOLONE ACETATE 40 MG/ML IJ SUSP
40.0000 mg | INTRAMUSCULAR | Status: AC | PRN
  Administered 2021-12-03: 40 mg via INTRA_ARTICULAR

## 2021-12-03 NOTE — Progress Notes (Signed)
Office Visit Note   [name]           Date of Birth: 27-Aug-1945           MRN: [id number] Visit Date: 11/30/2021              Requested by: Vivi Barrack, MD [address] [place],  [place] [address] PCP: Vivi Barrack, MD  Chief Complaint  Patient presents with   Left Foot - Wound Check   Right Foot - Wound Check      HPI: Patient is a 76 year old woman presents in follow-up with bilateral Wagner grade 1 ulcers.  She is status post left transmetatarsal amputation.  Patient also has impingement symptoms of her right shoulder.  Patient had symptoms several years ago which was resolved with a subacromial injection.  Assessment & Plan: Visit Diagnoses:  1. Non-pressure chronic ulcer of other part of right foot limited to breakdown of skin ([place])   2. 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[place])   3. Ulcer of left foot, limited to breakdown of skin ([place])   4. Impingement syndrome of right shoulder     Plan: Right shoulder was injected ulcers were debrided plan to follow-up in 4 weeks  Follow-Up Instructions: Return in about 4 weeks (around 12/28/2021).   Ortho Exam  Patient is alert, oriented, no adenopathy, well-dressed, normal affect, normal respiratory effort. Examination patient has pain with Neer and Hawkins impingement test of the right shoulder.  She has abduction and flexion to 70 degrees.  Examination of the left foot she has an ulcer beneath the forefoot of the transmetatarsal amputation.  After informed consent a 10 blade knife was used to debride the skin and soft tissue back to healthy viable granulation tissue the ulcer was 10 mm in diameter 1 mm deep after debridement.  Examination the right foot she has an ulcer beneath the third and fourth metatarsal heads.  After informed consent this was also debrided with a 10 blade knife and this ulcer is 2 cm in diameter 2 mm deep after debridement.  Imaging: No results found. No images are  attached to the encounter.  Labs: Lab Results  Component Value Date   HGBA1C 9.0 (A) 09/09/2021   HGBA1C 7.8 (A) 05/06/2021   HGBA1C 8.1 (H) 08/12/2020   ESRSEDRATE 51 (H) 12/24/2019   ESRSEDRATE 36 (H) 02/24/2017   CRP 13.2 (H) 12/24/2019   CRP 1.2 (H) 02/24/2017   LABURIC 5.5 09/01/2015   REPTSTATUS 06/03/2020 FINAL 05/29/2020   CULT  05/29/2020    NO GROWTH 5 DAYS Performed at [facility], [place] [address], [place], [place] [address]    LABORGA GROUP B STREP (S.AGALACTIAE) ISOLATED 12/26/2014     Lab Results  Component Value Date   ALBUMIN 4.1 11/05/2021   ALBUMIN 4.1 11/05/2021   ALBUMIN 3.8 09/08/2020   PREALBUMIN 19.4 03/05/2012    No results found for: "MG" No results found for: "VD25OH"  Lab Results  Component Value Date   PREALBUMIN 19.4 03/05/2012      Latest Ref Rng & Units 11/05/2021    9:13 AM 10/06/2021    8:10 AM 08/05/2021    8:02 AM  CBC EXTENDED  WBC 4.0 - 10.5 K/uL 11.7  9.9  11.6   RBC 3.87 - 5.11 Mil/uL 4.13  4.14  4.20   Hemoglobin 12.0 - 15.0 g/dL 11.8  12.0  12.5   HCT 36.0 - 46.0 % 35.9  36.7  37.2   Platelets 150.0 - 400.0 K/uL 364.0  403  360   NEUT# 1.7 - 7.7 K/uL  6.4  7.8   Lymph# 0.7 - 4.0 K/uL  2.4  2.7      There is no height or weight on file to calculate BMI.  Orders:  Orders Placed This Encounter  Procedures   Large Joint Inj   No orders of the defined types were placed in this encounter.    Procedures: Large Joint Inj: R subacromial bursa on 12/03/2021 9:59 AM Indications: diagnostic evaluation and pain Details: 22 G 1.5 in needle, posterior approach  Arthrogram: No  Medications: 5 mL lidocaine 1 %; 40 mg methylPREDNISolone acetate 40 MG/ML Outcome: tolerated well, no immediate complications Procedure, treatment alternatives, risks and benefits explained, specific risks discussed. Consent was given by the patient. Immediately prior to procedure a time out was called to verify the correct patient,  procedure, equipment, support staff and site/side marked as required. Patient was prepped and draped in the usual sterile fashion.      Clinical Data: No additional findings.  ROS:  All other systems negative, except as noted in the HPI. Review of Systems  Objective: Vital Signs: There were no vitals taken for this visit.  Specialty Comments:  No specialty comments available.  PMFS History: Patient Active Problem List   Diagnosis Date Noted   Osteopenia Last DEXA 11/23 11/18/2021   CKD stage 3 due to type 2 diabetes mellitus ([place]) 11/10/2021   Mild cognitive impairment 11/10/2021   Coccyx pain 12/18/2020   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[place]) 10/28/2020   Memory loss 11/21/2019   Mild nonproliferative diabetic retinopathy of both eyes ([place]) 10/23/2019   Nuclear sclerotic cataract of both eyes 10/23/2019   Posterior vitreous detachment of right eye 10/23/2019   Retinal hemorrhage of left eye 10/23/2019   Impingement syndrome of right shoulder 07/06/2017   Iron deficiency anemia 05/11/2017   Morbid obesity ([place]) 04/24/2017   Anemia 02/24/2017   Baker's cyst 07/10/2016   Onychomycosis 12/07/2015   Upper airway cough syndrome 03/05/2014   Hypertension associated with diabetes ([place]) 12/05/2006   T2DM (type 2 diabetes mellitus) ([place]) 10/12/2006   Dyslipidemia associated with type 2 diabetes mellitus ([place]) 10/12/2006   Allergic rhinitis 10/12/2006   GERD - Followed by Dr. Fuller Plan 10/12/2006   IRRITABLE BOWEL SYNDROME - followed by Dr. Fuller Plan 10/12/2006   Peripheral neuropathy 10/11/2006   ALOPECIA NEC 10/11/2006   Past Medical History:  Diagnosis Date   Alopecia    Anemia, mild    Arthritis    Chronic cough    sees pulmonologist   Chronic pain    chest wall and abd - s/p extensive eval   Diabetes mellitus with neuropathy (HCC)    sees endocrine   Diverticulosis    Dyspnea    Fatty liver    GERD (gastroesophageal reflux disease)    takes Nexium  bid, hx erosive esophagitis   Headache(784.0)    occasionally;r/t sinus    History of colon polyps    HTN (hypertension)    Hx of amputation of lesser toe (HCC)    sees podiatrist   Hyperlipemia    IBS (irritable bowel syndrome)    Insomnia    takes Elavil nightly   Joint pain    Neuropathy    Neuropathy    Osteomyelitis ([place])    Pneumonia    89/2/22- patient denies   PONV (postoperative nausea and vomiting)  medication did not help with surgery on 03/28/20   Seasonal allergies    takes Zyrtec daily   Sinus tachycardia     Family History  Problem Relation Age of Onset   Lung cancer Mother 21       smoked heavily   Emphysema Mother    Hypertension Father    Hyperlipidemia Father    Diabetes Father    Coronary artery disease Father    Dementia Father    COPD Father        smoked   Stomach cancer Paternal Aunt    Brain cancer Paternal Uncle    Irritable bowel syndrome Other        Several family members on fathers side    Diabetes Other    Stomach cancer Maternal Aunt    Lung cancer Paternal Uncle    Heart disease Paternal Uncle    Colon cancer Neg Hx     Past Surgical History:  Procedure Laterality Date   AMPUTATION  12/28/2011   Procedure: AMPUTATION DIGIT;  Surgeon: Newt Minion, MD;  Location: [facility];  Service: Orthopedics;  Laterality: Right;  Right Foot 2nd Toe Amputation at MTP (metatarsophalangeal joint)   AMPUTATION Right 04/25/2012   Procedure: Right Foot 3rd Toe Amputation;  Surgeon: Newt Minion, MD;  Location: [facility];  Service: Orthopedics;  Laterality: Right;  Right Foot Third Toe Amputation    AMPUTATION Right 07/27/2012   Procedure: Right 4th Toe Amputation at Metatarsophalangeal;  Surgeon: Newt Minion, MD;  Location: [facility];  Service: Orthopedics;  Laterality: Right;  Right 4th Toe Amputation at Metatarsophalangeal   AMPUTATION Left 07/15/2016   Procedure: Left 2nd Ray Amputation;  Surgeon: Newt Minion, MD;  Location: [facility];  Service: Orthopedics;   Laterality: Left;   AMPUTATION Left 11/18/2016   Procedure: Left 3rd and 4th Ray Amputation;  Surgeon: Newt Minion, MD;  Location: [facility];  Service: Orthopedics;  Laterality: Left;   AMPUTATION Right 03/29/2017   Procedure: RIGHT FOOT 3RD AND 4TH RAY AMPUTATION;  Surgeon: Newt Minion, MD;  Location: [facility];  Service: Orthopedics;  Laterality: Right;   AMPUTATION Left 08/12/2020   Procedure: LEFT TRANSMETATARSAL AMPUTATION;  Surgeon: Newt Minion, MD;  Location: [facility];  Service: Orthopedics;  Laterality: Left;   APPLICATION OF WOUND VAC Left 08/12/2020   Procedure: APPLICATION OF WOUND VAC;  Surgeon: Newt Minion, MD;  Location: [facility];  Service: Orthopedics;  Laterality: Left;   COLONOSCOPY     LAPAROSCOPIC APPENDECTOMY  01/05/2011   Procedure: APPENDECTOMY LAPAROSCOPIC;  Surgeon: Pedro Earls, MD;  Location: [facility];  Service: General;  Laterality: N/A;   OOPHORECTOMY  2001   ROTATOR CUFF REPAIR Right    x 2   TUBAL LIGATION     VAGINAL HYSTERECTOMY  2001   Social History   Occupational History   Occupation: Retired   Tobacco Use   Smoking status: Never   Smokeless tobacco: Never  Vaping Use   Vaping Use: Never used  Substance and Sexual Activity   Alcohol use: Never   Drug use: Never   Sexual activity: Yes    Birth control/protection: Surgical

## 2021-12-08 ENCOUNTER — Inpatient Hospital Stay: Payer: Medicare Other | Attending: Oncology | Admitting: Oncology

## 2021-12-08 ENCOUNTER — Inpatient Hospital Stay: Payer: Medicare Other

## 2021-12-08 ENCOUNTER — Encounter: Payer: Self-pay | Admitting: Oncology

## 2021-12-08 VITALS — BP 138/58 | HR 67 | Temp 98.2°F | Resp 20 | Ht 66.0 in | Wt 225.8 lb

## 2021-12-08 DIAGNOSIS — I10 Essential (primary) hypertension: Secondary | ICD-10-CM | POA: Diagnosis not present

## 2021-12-08 DIAGNOSIS — D509 Iron deficiency anemia, unspecified: Secondary | ICD-10-CM

## 2021-12-08 DIAGNOSIS — E119 Type 2 diabetes mellitus without complications: Secondary | ICD-10-CM | POA: Insufficient documentation

## 2021-12-08 DIAGNOSIS — D649 Anemia, unspecified: Secondary | ICD-10-CM

## 2021-12-08 DIAGNOSIS — K5909 Other constipation: Secondary | ICD-10-CM | POA: Diagnosis not present

## 2021-12-08 LAB — CBC WITH DIFFERENTIAL (CANCER CENTER ONLY)
Abs Immature Granulocytes: 0.03 10*3/uL (ref 0.00–0.07)
Basophils Absolute: 0.1 10*3/uL (ref 0.0–0.1)
Basophils Relative: 1 %
Eosinophils Absolute: 0.3 10*3/uL (ref 0.0–0.5)
Eosinophils Relative: 2 %
HCT: 37.6 % (ref 36.0–46.0)
Hemoglobin: 12.4 g/dL (ref 12.0–15.0)
Immature Granulocytes: 0 %
Lymphocytes Relative: 26 %
Lymphs Abs: 3.4 10*3/uL (ref 0.7–4.0)
MCH: 29.5 pg (ref 26.0–34.0)
MCHC: 33 g/dL (ref 30.0–36.0)
MCV: 89.3 fL (ref 80.0–100.0)
Monocytes Absolute: 0.8 10*3/uL (ref 0.1–1.0)
Monocytes Relative: 6 %
Neutro Abs: 8.6 10*3/uL — ABNORMAL HIGH (ref 1.7–7.7)
Neutrophils Relative %: 65 %
Platelet Count: 368 10*3/uL (ref 150–400)
RBC: 4.21 MIL/uL (ref 3.87–5.11)
RDW: 13 % (ref 11.5–15.5)
WBC Count: 13.1 10*3/uL — ABNORMAL HIGH (ref 4.0–10.5)
nRBC: 0 % (ref 0.0–0.2)

## 2021-12-08 LAB — FERRITIN: Ferritin: 140 ng/mL (ref 11–307)

## 2021-12-08 NOTE — Progress Notes (Signed)
  [facility] OFFICE PROGRESS NOTE   Diagnosis: Iron deficiency anemia  INTERVAL HISTORY:   Beth Holden returns as scheduled.  No new complaint.  She reports chronic constipation.  No bleeding.  Good appetite.  She last received IV iron in April.  She reports partial improvement in her energy level after the IV iron.  A foot ulcer has almost completely healed.  She is followed by Dr. Sharol Given.  Objective:  Vital signs in last 24 hours:  Blood pressure (!) 138/58, pulse 67, temperature 98.2 F (36.8 C), temperature source Oral, resp. rate 20, height '5\' 6"'$  (1.676 m), weight 225 lb 12.8 oz (102.4 kg), SpO2 98 %.   Resp: Lungs clear bilaterally Cardio: Regular rate and rhythm GI: No hepatosplenomegaly Vascular: No leg edema   Lab Results:  Lab Results  Component Value Date   WBC 13.1 (H) 12/08/2021   HGB 12.4 12/08/2021   HCT 37.6 12/08/2021   MCV 89.3 12/08/2021   PLT 368 12/08/2021   NEUTROABS 8.6 (H) 12/08/2021    CMP  Lab Results  Component Value Date   NA 140 11/05/2021   NA 140 11/05/2021   K 3.8 11/05/2021   K 3.8 11/05/2021   CL 100 11/05/2021   CL 100 11/05/2021   CO2 31 11/05/2021   CO2 31 11/05/2021   GLUCOSE 116 (H) 11/05/2021   GLUCOSE 116 (H) 11/05/2021   BUN 23 11/05/2021   BUN 23 11/05/2021   CREATININE 0.98 11/05/2021   CREATININE 0.98 11/05/2021   CALCIUM 9.7 11/05/2021   CALCIUM 9.7 11/05/2021   PROT 7.1 11/05/2021   ALBUMIN 4.1 11/05/2021   ALBUMIN 4.1 11/05/2021   AST 15 11/05/2021   ALT 11 11/05/2021   ALKPHOS 40 11/05/2021   BILITOT 0.4 11/05/2021   GFRNONAA 50 (L) 08/12/2020   GFRAA 78 11/23/2018    No results found for: "CEA1", "CEA", "CAN199", "CA125"  Lab Results  Component Value Date   INR 1.2 05/29/2020   LABPROT 14.7 05/29/2020    Imaging:  No results found.  Medications: I have reviewed the patient's current medications.   Assessment/Plan: Anemia secondary to iron deficiency, possible GI blood loss  related to gastritis status post IV ferrous gluconate weekly x4 beginning 03/13/2017, single dose of IV ferrous gluconate 05/22/2017.  Hemoglobin/MCV corrected into normal range. Feraheme 03/29/2021, 04/15/2021 Upper endoscopy 02/15/2017-diffuse moderate inflammation characterized by congestion, erythema, friability and granularity in the gastric body, posterior wall of the stomach and gastric antrum.  Biopsy GASTRIC ANTRAL MUCOSA WITH NON-SPECIFIC REACTIVE GASTROPATHY AND FEW SUBEPITHELIAL CRYSTALLINE IRON DEPOSITS, CONSISTENT WITH IRON PILL GASTROPATHY. GASTRIC OXYNTIC MUCOSA WITH PARIETAL CELL HYPERPLASIA AS CAN BE SEEN IN HYPERGASTRINEMIC STATES SUCH AS PPI THERAPY. WARTHIN-STARRY STAIN IS NEGATIVE FOR HELICOBACTER PYLORI Colonoscopy 02/15/2017-6 mm polyp in the transverse colon (TUBULAR ADENOMA. NEGATIVE FOR HIGH GRADE DYSPLASIA OR MALIGNANCY) Diabetes Hypertension Mild neutrophilia- chronic, white count dating at least to 2012 on review of epic chart Small hemoglobin on repeat urinalyses-seen by urology.    Disposition: Beth Holden appears stable.  The hemoglobin is normal today.  Will follow-up on the ferritin level.  She is no longer taking iron.  She will return for an office and lab visit in 6 months. She plans to schedule a follow-up colonoscopy in 2024. She has chronic mild neutrophilia.  Beth Coder, MD  12/08/2021  8:36 AM

## 2021-12-13 ENCOUNTER — Ambulatory Visit (INDEPENDENT_AMBULATORY_CARE_PROVIDER_SITE_OTHER): Payer: Medicare Other | Admitting: Orthopedic Surgery

## 2021-12-13 DIAGNOSIS — L97511 Non-pressure chronic ulcer of other part of right foot limited to breakdown of skin: Secondary | ICD-10-CM | POA: Diagnosis not present

## 2021-12-13 NOTE — Progress Notes (Signed)
Office Visit Note   [name]           Date of Birth: 05/10/1945           MRN: [id number] Visit Date: 12/13/2021              Requested by: Vivi Barrack, MD [address] [place],  [place] [address] PCP: Vivi Barrack, MD  Chief Complaint  Patient presents with   Right Foot - Wound Check   Left Foot - Wound Check      HPI: Patient is a 76 year old woman who noticed recurrent ulceration odor plantar aspect of the right foot lateral metatarsal heads.  Patient has used Silvadene in the past.  She states she has a new skin tear from removing a Band-Aid on the right foot.    Assessment & Plan: Visit Diagnoses:  1. Non-pressure chronic ulcer of other part of right foot limited to breakdown of skin ([place])     Plan: Will follow-up as scheduled in approximately 2-1/2 weeks she will continue with Dial soap cleansing Silvadene dressing changes.  Follow-Up Instructions: No follow-ups on file.   Ortho Exam  Patient is alert, oriented, no adenopathy, well-dressed, normal affect, normal respiratory effort. Examination there is no redness no cellulitis she has recurrent ulceration beneath the third and fourth metatarsal heads.  After informed consent a 10 blade knife was used debride the skin and soft tissue back to healthy viable granulation tissue.  This was touched with silver nitrate the ulcer is 2 cm in diameter 1 mm deep after debridement there is no tunneling no exposed bone or tendon.  Imaging: No results found. No images are attached to the encounter.  Labs: Lab Results  Component Value Date   HGBA1C 9.0 (A) 09/09/2021   HGBA1C 7.8 (A) 05/06/2021   HGBA1C 8.1 (H) 08/12/2020   ESRSEDRATE 51 (H) 12/24/2019   ESRSEDRATE 36 (H) 02/24/2017   CRP 13.2 (H) 12/24/2019   CRP 1.2 (H) 02/24/2017   LABURIC 5.5 09/01/2015   REPTSTATUS 06/03/2020 FINAL 05/29/2020   CULT  05/29/2020    NO GROWTH 5 DAYS Performed at [facility], [place] [address],  [place], [place] [address]    LABORGA GROUP B STREP (S.AGALACTIAE) ISOLATED 12/26/2014     Lab Results  Component Value Date   ALBUMIN 4.1 11/05/2021   ALBUMIN 4.1 11/05/2021   ALBUMIN 3.8 09/08/2020   PREALBUMIN 19.4 03/05/2012    No results found for: "MG" No results found for: "VD25OH"  Lab Results  Component Value Date   PREALBUMIN 19.4 03/05/2012      Latest Ref Rng & Units 12/08/2021    7:59 AM 11/05/2021    9:13 AM 10/06/2021    8:10 AM  CBC EXTENDED  WBC 4.0 - 10.5 K/uL 13.1  11.7  9.9   RBC 3.87 - 5.11 MIL/uL 4.21  4.13  4.14   Hemoglobin 12.0 - 15.0 g/dL 12.4  11.8  12.0   HCT 36.0 - 46.0 % 37.6  35.9  36.7   Platelets 150 - 400 K/uL 368  364.0  403   NEUT# 1.7 - 7.7 K/uL 8.6   6.4   Lymph# 0.7 - 4.0 K/uL 3.4   2.4      There is no height or weight on file to calculate BMI.  Orders:  No orders of the defined types were placed in this encounter.  No orders of the defined types were placed in this encounter.  Procedures: No procedures performed  Clinical Data: No additional findings.  ROS:  All other systems negative, except as noted in the HPI. Review of Systems  Objective: Vital Signs: There were no vitals taken for this visit.  Specialty Comments:  No specialty comments available.  PMFS History: Patient Active Problem List   Diagnosis Date Noted   Osteopenia Last DEXA 11/23 11/18/2021   CKD stage 3 due to type 2 diabetes mellitus ([place]) 11/10/2021   Mild cognitive impairment 11/10/2021   Coccyx pain 12/18/2020   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[place]) 10/28/2020   Memory loss 11/21/2019   Mild nonproliferative diabetic retinopathy of both eyes ([place]) 10/23/2019   Nuclear sclerotic cataract of both eyes 10/23/2019   Posterior vitreous detachment of right eye 10/23/2019   Retinal hemorrhage of left eye 10/23/2019   Impingement syndrome of right shoulder 07/06/2017   Iron deficiency anemia 05/11/2017   Morbid obesity ([place])  04/24/2017   Anemia 02/24/2017   Baker's cyst 07/10/2016   Onychomycosis 12/07/2015   Upper airway cough syndrome 03/05/2014   Hypertension associated with diabetes ([place]) 12/05/2006   T2DM (type 2 diabetes mellitus) ([place]) 10/12/2006   Dyslipidemia associated with type 2 diabetes mellitus ([place]) 10/12/2006   Allergic rhinitis 10/12/2006   GERD - Followed by Dr. Fuller Plan 10/12/2006   IRRITABLE BOWEL SYNDROME - followed by Dr. Fuller Plan 10/12/2006   Peripheral neuropathy 10/11/2006   ALOPECIA NEC 10/11/2006   Past Medical History:  Diagnosis Date   Alopecia    Anemia, mild    Arthritis    Chronic cough    sees pulmonologist   Chronic pain    chest wall and abd - s/p extensive eval   Diabetes mellitus with neuropathy (HCC)    sees endocrine   Diverticulosis    Dyspnea    Fatty liver    GERD (gastroesophageal reflux disease)    takes Nexium bid, hx erosive esophagitis   Headache(784.0)    occasionally;r/t sinus    History of colon polyps    HTN (hypertension)    Hx of amputation of lesser toe (HCC)    sees podiatrist   Hyperlipemia    IBS (irritable bowel syndrome)    Insomnia    takes Elavil nightly   Joint pain    Neuropathy    Neuropathy    Osteomyelitis ([place])    Pneumonia    89/2/22- patient denies   PONV (postoperative nausea and vomiting)    medication did not help with surgery on 03/28/20   Seasonal allergies    takes Zyrtec daily   Sinus tachycardia     Family History  Problem Relation Age of Onset   Lung cancer Mother 12       smoked heavily   Emphysema Mother    Hypertension Father    Hyperlipidemia Father    Diabetes Father    Coronary artery disease Father    Dementia Father    COPD Father        smoked   Stomach cancer Paternal Aunt    Brain cancer Paternal Uncle    Irritable bowel syndrome Other        Several family members on fathers side    Diabetes Other    Stomach cancer Maternal Aunt    Lung cancer Paternal Uncle    Heart disease Paternal  Uncle    Colon cancer Neg Hx     Past Surgical History:  Procedure Laterality Date   AMPUTATION  12/28/2011  Procedure: AMPUTATION DIGIT;  Surgeon: Newt Minion, MD;  Location: [facility];  Service: Orthopedics;  Laterality: Right;  Right Foot 2nd Toe Amputation at MTP (metatarsophalangeal joint)   AMPUTATION Right 04/25/2012   Procedure: Right Foot 3rd Toe Amputation;  Surgeon: Newt Minion, MD;  Location: [facility];  Service: Orthopedics;  Laterality: Right;  Right Foot Third Toe Amputation    AMPUTATION Right 07/27/2012   Procedure: Right 4th Toe Amputation at Metatarsophalangeal;  Surgeon: Newt Minion, MD;  Location: [facility];  Service: Orthopedics;  Laterality: Right;  Right 4th Toe Amputation at Metatarsophalangeal   AMPUTATION Left 07/15/2016   Procedure: Left 2nd Ray Amputation;  Surgeon: Newt Minion, MD;  Location: [facility];  Service: Orthopedics;  Laterality: Left;   AMPUTATION Left 11/18/2016   Procedure: Left 3rd and 4th Ray Amputation;  Surgeon: Newt Minion, MD;  Location: [facility];  Service: Orthopedics;  Laterality: Left;   AMPUTATION Right 03/29/2017   Procedure: RIGHT FOOT 3RD AND 4TH RAY AMPUTATION;  Surgeon: Newt Minion, MD;  Location: [facility];  Service: Orthopedics;  Laterality: Right;   AMPUTATION Left 08/12/2020   Procedure: LEFT TRANSMETATARSAL AMPUTATION;  Surgeon: Newt Minion, MD;  Location: [facility];  Service: Orthopedics;  Laterality: Left;   APPLICATION OF WOUND VAC Left 08/12/2020   Procedure: APPLICATION OF WOUND VAC;  Surgeon: Newt Minion, MD;  Location: [facility];  Service: Orthopedics;  Laterality: Left;   COLONOSCOPY     LAPAROSCOPIC APPENDECTOMY  01/05/2011   Procedure: APPENDECTOMY LAPAROSCOPIC;  Surgeon: Pedro Earls, MD;  [facility];  Service: General;  Laterality: N/A;   OOPHORECTOMY  2001   ROTATOR CUFF REPAIR Right    x 2   TUBAL LIGATION     VAGINAL HYSTERECTOMY  2001   Social History   Occupational History   Occupation: Retired   Tobacco Use    Smoking status: Never   Smokeless tobacco: Never  Vaping Use   Vaping Use: Never used  Substance and Sexual Activity   Alcohol use: Never   Drug use: Never   Sexual activity: Yes    Birth control/protection: Surgical

## 2021-12-28 ENCOUNTER — Ambulatory Visit
Admission: RE | Admit: 2021-12-28 | Discharge: 2021-12-28 | Disposition: A | Discharge status: Home / Self Care | Payer: Medicare Other | Source: Ambulatory Visit | Attending: Family Medicine | Admitting: Family Medicine

## 2021-12-28 ENCOUNTER — Ambulatory Visit: Payer: Medicare Other | Admitting: Orthopedic Surgery

## 2021-12-28 DIAGNOSIS — Z1231 Encounter for screening mammogram for malignant neoplasm of breast: Secondary | ICD-10-CM

## 2021-12-29 ENCOUNTER — Encounter: Payer: Self-pay | Admitting: Family

## 2021-12-29 ENCOUNTER — Ambulatory Visit (INDEPENDENT_AMBULATORY_CARE_PROVIDER_SITE_OTHER): Payer: Medicare Other | Admitting: Family

## 2021-12-29 DIAGNOSIS — Z89432 Acquired absence of left foot: Secondary | ICD-10-CM | POA: Diagnosis not present

## 2021-12-29 DIAGNOSIS — L97521 Non-pressure chronic ulcer of other part of left foot limited to breakdown of skin: Secondary | ICD-10-CM | POA: Diagnosis not present

## 2021-12-29 DIAGNOSIS — L97511 Non-pressure chronic ulcer of other part of right foot limited to breakdown of skin: Secondary | ICD-10-CM | POA: Diagnosis not present

## 2021-12-29 NOTE — Progress Notes (Signed)
Office Visit Note   [name]           Date of Birth: 1945/10/09           MRN: [id number] Visit Date: 12/29/2021              Requested by: Vivi Barrack, MD [address] [place],  [place] [address] PCP: Vivi Barrack, MD  Chief Complaint  Patient presents with   Right Foot - Follow-up   Left Foot - Follow-up      HPI: The patient is a 76 year old woman who is seen today in follow-up for Wagner grade 1 ulcers bilateral feet she has been doing Silvadene dressing changes to the right foot.  She was last seen a little over 2 weeks ago she has completed her course is pleased with her improvement.  Assessment & Plan: Visit Diagnoses: No diagnosis found. Plan: She will continue with her daily dose of cleansing foot checks dressing changes to the right foot.  Follow-up in the office in 4 weeks for reevaluation.  Follow-Up Instructions: No follow-ups on file.   Ortho Exam  Patient is alert, oriented, no adenopathy, well-dressed, normal affect, normal respiratory effort. On examination of the right foot there is Wagner grade 1 ulcer beneath third metatarsal head after debridement the open area is 4 mm in diameter with 1 mm of depth there is no active drainage no surrounding erythema no maceration no sign of infection.  She does have some callused ulceration to the medial aspect of her transmetatarsal amputation on the left this was debrided with a 10 blade knife back to viable tissue there is no open area no sign of infection  Imaging: No results found. No images are attached to the encounter.  Labs: Lab Results  Component Value Date   HGBA1C 9.0 (A) 09/09/2021   HGBA1C 7.8 (A) 05/06/2021   HGBA1C 8.1 (H) 08/12/2020   ESRSEDRATE 51 (H) 12/24/2019   ESRSEDRATE 36 (H) 02/24/2017   CRP 13.2 (H) 12/24/2019   CRP 1.2 (H) 02/24/2017   LABURIC 5.5 09/01/2015   REPTSTATUS 06/03/2020 FINAL 05/29/2020   CULT  05/29/2020    NO GROWTH 5 DAYS Performed at [facility], [place] [address], [place], [place] [address]    LABORGA GROUP B STREP (S.AGALACTIAE) ISOLATED 12/26/2014     Lab Results  Component Value Date   ALBUMIN 4.1 11/05/2021   ALBUMIN 4.1 11/05/2021   ALBUMIN 3.8 09/08/2020   PREALBUMIN 19.4 03/05/2012    No results found for: "MG" No results found for: "VD25OH"  Lab Results  Component Value Date   PREALBUMIN 19.4 03/05/2012      Latest Ref Rng & Units 12/08/2021    7:59 AM 11/05/2021    9:13 AM 10/06/2021    8:10 AM  CBC EXTENDED  WBC 4.0 - 10.5 K/uL 13.1  11.7  9.9   RBC 3.87 - 5.11 MIL/uL 4.21  4.13  4.14   Hemoglobin 12.0 - 15.0 g/dL 12.4  11.8  12.0   HCT 36.0 - 46.0 % 37.6  35.9  36.7   Platelets 150 - 400 K/uL 368  364.0  403   NEUT# 1.7 - 7.7 K/uL 8.6   6.4   Lymph# 0.7 - 4.0 K/uL 3.4   2.4      There is no height or weight on file to calculate BMI.  Orders:  No orders of the defined types were placed in this encounter.  No orders  of the defined types were placed in this encounter.    Procedures: No procedures performed  Clinical Data: No additional findings.  ROS:  All other systems negative, except as noted in the HPI. Review of Systems  Objective: Vital Signs: There were no vitals taken for this visit.  Specialty Comments:  No specialty comments available.  PMFS History: Patient Active Problem List   Diagnosis Date Noted   Osteopenia Last DEXA 11/23 11/18/2021   CKD stage 3 due to type 2 diabetes mellitus ([place]) 11/10/2021   Mild cognitive impairment 11/10/2021   Coccyx pain 12/18/2020   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[place]) 10/28/2020   Memory loss 11/21/2019   Mild nonproliferative diabetic retinopathy of both eyes ([place]) 10/23/2019   Nuclear sclerotic cataract of both eyes 10/23/2019   Posterior vitreous detachment of right eye 10/23/2019   Retinal hemorrhage of left eye 10/23/2019   Impingement syndrome of right shoulder 07/06/2017   Iron deficiency anemia  05/11/2017   Morbid obesity ([place]) 04/24/2017   Anemia 02/24/2017   Baker's cyst 07/10/2016   Onychomycosis 12/07/2015   Upper airway cough syndrome 03/05/2014   Hypertension associated with diabetes ([place]) 12/05/2006   T2DM (type 2 diabetes mellitus) ([place]) 10/12/2006   Dyslipidemia associated with type 2 diabetes mellitus ([place]) 10/12/2006   Allergic rhinitis 10/12/2006   GERD - Followed by Dr. Fuller Plan 10/12/2006   IRRITABLE BOWEL SYNDROME - followed by Dr. Fuller Plan 10/12/2006   Peripheral neuropathy 10/11/2006   ALOPECIA NEC 10/11/2006   Past Medical History:  Diagnosis Date   Alopecia    Anemia, mild    Arthritis    Chronic cough    sees pulmonologist   Chronic pain    chest wall and abd - s/p extensive eval   Diabetes mellitus with neuropathy (HCC)    sees endocrine   Diverticulosis    Dyspnea    Fatty liver    GERD (gastroesophageal reflux disease)    takes Nexium bid, hx erosive esophagitis   Headache(784.0)    occasionally;r/t sinus    History of colon polyps    HTN (hypertension)    Hx of amputation of lesser toe (HCC)    sees podiatrist   Hyperlipemia    IBS (irritable bowel syndrome)    Insomnia    takes Elavil nightly   Joint pain    Neuropathy    Neuropathy    Osteomyelitis ([place])    Pneumonia    89/2/22- patient denies   PONV (postoperative nausea and vomiting)    medication did not help with surgery on 03/28/20   Seasonal allergies    takes Zyrtec daily   Sinus tachycardia     Family History  Problem Relation Age of Onset   Lung cancer Mother 75       smoked heavily   Emphysema Mother    Hypertension Father    Hyperlipidemia Father    Diabetes Father    Coronary artery disease Father    Dementia Father    COPD Father        smoked   Stomach cancer Paternal Aunt    Brain cancer Paternal Uncle    Irritable bowel syndrome Other        Several family members on fathers side    Diabetes Other    Stomach cancer Maternal Aunt    Lung cancer Paternal  Uncle    Heart disease Paternal Uncle    Colon cancer Neg Hx     Past Surgical  History:  Procedure Laterality Date   AMPUTATION  12/28/2011   Procedure: AMPUTATION DIGIT;  Surgeon: Newt Minion, MD;  Location: [facility];  Service: Orthopedics;  Laterality: Right;  Right Foot 2nd Toe Amputation at MTP (metatarsophalangeal joint)   AMPUTATION Right 04/25/2012   Procedure: Right Foot 3rd Toe Amputation;  Surgeon: Newt Minion, MD;  Location: [facility];  Service: Orthopedics;  Laterality: Right;  Right Foot Third Toe Amputation    AMPUTATION Right 07/27/2012   Procedure: Right 4th Toe Amputation at Metatarsophalangeal;  Surgeon: Newt Minion, MD;  Location: [facility];  Service: Orthopedics;  Laterality: Right;  Right 4th Toe Amputation at Metatarsophalangeal   AMPUTATION Left 07/15/2016   Procedure: Left 2nd Ray Amputation;  Surgeon: Newt Minion, MD;  Location: [facility];  Service: Orthopedics;  Laterality: Left;   AMPUTATION Left 11/18/2016   Procedure: Left 3rd and 4th Ray Amputation;  Surgeon: Newt Minion, MD;  Location: [facility];  Service: Orthopedics;  Laterality: Left;   AMPUTATION Right 03/29/2017   Procedure: RIGHT FOOT 3RD AND 4TH RAY AMPUTATION;  Surgeon: Newt Minion, MD;  Location: [facility];  Service: Orthopedics;  Laterality: Right;   AMPUTATION Left 08/12/2020   Procedure: LEFT TRANSMETATARSAL AMPUTATION;  Surgeon: Newt Minion, MD;  Location: [facility];  Service: Orthopedics;  Laterality: Left;   APPLICATION OF WOUND VAC Left 08/12/2020   Procedure: APPLICATION OF WOUND VAC;  Surgeon: Newt Minion, MD;  Location: [facility];  Service: Orthopedics;  Laterality: Left;   COLONOSCOPY     LAPAROSCOPIC APPENDECTOMY  01/05/2011   Procedure: APPENDECTOMY LAPAROSCOPIC;  Surgeon: Pedro Earls, MD;  Location: [facility];  Service: General;  Laterality: N/A;   OOPHORECTOMY  2001   ROTATOR CUFF REPAIR Right    x 2   TUBAL LIGATION     VAGINAL HYSTERECTOMY  2001   Social History   Occupational History    Occupation: Retired   Tobacco Use   Smoking status: Never   Smokeless tobacco: Never  Vaping Use   Vaping Use: Never used  Substance and Sexual Activity   Alcohol use: Never   Drug use: Never   Sexual activity: Yes    Birth control/protection: Surgical

## 2021-12-30 ENCOUNTER — Other Ambulatory Visit: Payer: Self-pay | Admitting: Internal Medicine

## 2021-12-30 ENCOUNTER — Other Ambulatory Visit: Payer: Self-pay | Admitting: Physician Assistant

## 2021-12-30 DIAGNOSIS — Z794 Long term (current) use of insulin: Secondary | ICD-10-CM

## 2022-01-07 ENCOUNTER — Telehealth: Payer: Medicare Other | Admitting: Physician Assistant

## 2022-01-07 DIAGNOSIS — B9689 Other specified bacterial agents as the cause of diseases classified elsewhere: Secondary | ICD-10-CM

## 2022-01-07 DIAGNOSIS — J208 Acute bronchitis due to other specified organisms: Secondary | ICD-10-CM

## 2022-01-07 DIAGNOSIS — T3695XA Adverse effect of unspecified systemic antibiotic, initial encounter: Secondary | ICD-10-CM

## 2022-01-07 DIAGNOSIS — B379 Candidiasis, unspecified: Secondary | ICD-10-CM | POA: Diagnosis not present

## 2022-01-07 MED ORDER — MICONAZOLE 3 4 % VA CREA: TOPICAL_CREAM | VAGINAL | 0 refills | Status: DC

## 2022-01-07 MED ORDER — AZITHROMYCIN 250 MG PO TABS: ORAL_TABLET | ORAL | 0 refills | Status: AC

## 2022-01-07 MED ORDER — FLUCONAZOLE 150 MG PO TABS: 150.0000 mg | ORAL_TABLET | ORAL | 0 refills | Status: DC | PRN

## 2022-01-07 NOTE — Progress Notes (Signed)
Virtual Visit Consent   Beth Holden, you are scheduled for a virtual visit with a [facility] provider today. Just as with appointments in the office, your consent must be obtained to participate. Your consent will be active for this visit and any virtual visit you may have with one of our providers in the next 365 days. If you have a MyChart account, a copy of this consent can be sent to you electronically.  As this is a virtual visit, video technology does not allow for your provider to perform a traditional examination. This may limit your provider's ability to fully assess your condition. If your provider identifies any concerns that need to be evaluated in person or the need to arrange testing (such as labs, EKG, etc.), we will make arrangements to do so. Although advances in technology are sophisticated, we cannot ensure that it will always work on either your end or our end. If the connection with a video visit is poor, the visit may have to be switched to a telephone visit. With either a video or telephone visit, we are not always able to ensure that we have a secure connection.  By engaging in this virtual visit, you consent to the provision of healthcare and authorize for your insurance to be billed (if applicable) for the services provided during this visit. Depending on your insurance coverage, you may receive a charge related to this service.  I need to obtain your verbal consent now. Are you willing to proceed with your visit today? Beth Holden has provided verbal consent on 01/07/2022 for a virtual visit (video or telephone). Mar Daring, PA-C  Date: 01/07/2022 2:09 PM  Virtual Visit via Video Note   I, Mar Daring, connected with  Beth Holden  ([id number], February 06, 1945) on 01/07/22 at  2:00 PM EST by a video-enabled telemedicine application and verified that I am speaking with the correct person using two identifiers.  Location: Patient: Virtual Visit  Location Patient: Home Provider: Virtual Visit Location Provider: Home Office   I discussed the limitations of evaluation and management by telemedicine and the availability of in person appointments. The patient expressed understanding and agreed to proceed.    History of Present Illness: Beth Holden is a 76 y.o. who identifies as a female who was assigned female at birth, and is being seen today for URI symptoms.  HPI: URI  This is a new problem. The current episode started 1 to 4 weeks ago (3 weeks; worsened this week). The problem has been gradually worsening. There has been no fever. Associated symptoms include congestion, coughing, ear pain (right), headaches, nausea (mild in mornings), a plugged ear sensation (right), rhinorrhea (and post nasal drainage) and a sore throat (mild). Pertinent negatives include no diarrhea, sinus pain or vomiting. Treatments tried: mucinex, tylenol. The treatment provided no relief.     Problems:  Patient Active Problem List   Diagnosis Date Noted   Osteopenia Last DEXA 11/23 11/18/2021   CKD stage 3 due to type 2 diabetes mellitus ([place]) 11/10/2021   Mild cognitive impairment 11/10/2021   Coccyx pain 12/18/2020   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[place]) 10/28/2020   Memory loss 11/21/2019   Mild nonproliferative diabetic retinopathy of both eyes ([place]) 10/23/2019   Nuclear sclerotic cataract of both eyes 10/23/2019   Posterior vitreous detachment of right eye 10/23/2019   Retinal hemorrhage of left eye 10/23/2019   Impingement syndrome of right shoulder 07/06/2017   Iron  deficiency anemia 05/11/2017   Morbid obesity ([place]) 04/24/2017   Anemia 02/24/2017   Baker's cyst 07/10/2016   Onychomycosis 12/07/2015   Upper airway cough syndrome 03/05/2014   Hypertension associated with diabetes ([place]) 12/05/2006   T2DM (type 2 diabetes mellitus) ([place]) 10/12/2006   Dyslipidemia associated with type 2 diabetes mellitus ([place]) 10/12/2006    Allergic rhinitis 10/12/2006   GERD - Followed by Dr. Fuller Plan 10/12/2006   IRRITABLE BOWEL SYNDROME - followed by Dr. 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   Codeine Other (See Comments)    Makes her crazy   Propoxyphene Hcl Itching    *DARVOCET    Medications:  Current Outpatient Medications:    azithromycin (ZITHROMAX) 250 MG tablet, Take 2 tablets on day 1, then 1 tablet daily on days 2 through 5, Disp: 6 tablet, Rfl: 0   fluconazole (DIFLUCAN) 150 MG tablet, Take 1 tablet (150 mg total) by mouth every 3 (three) days as needed., Disp: 3 tablet, Rfl: 0   MICONAZOLE NITRATE VAGINAL (MICONAZOLE 3) 4 % CREA, Use topically twice daily for vaginal irritation and in groin, Disp: 25 g, Rfl: 0   acetaminophen (TYLENOL) 500 MG tablet, Take 500 mg by mouth every 8 (eight) hours as needed for mild pain. , Disp: , Rfl:    azelastine (ASTELIN) 0.1 % nasal spray, Place 2 sprays into both nostrils 2 (two) times daily., Disp: 30 mL, Rfl: 12   betamethasone dipropionate (DIPROLENE) 0.05 % cream, Apply 1 application topically 2 (two) times daily as needed (IRRITATION)., Disp: 30 g, Rfl: 0   Blood Glucose Monitoring Suppl (ONE TOUCH ULTRA 2) w/Device KIT, Use to check blood sugar, Disp: 1 kit, Rfl: 0   Calcium Carb-Cholecalciferol (CALCIUM + VITAMIN D3) 600-10 MG-MCG TABS, Take 2 tablets by mouth daily., Disp: 90 tablet, Rfl: 3   cetirizine (ZYRTEC) 10 MG tablet, Take 1 tablet (10 mg total) by mouth daily., Disp: 90 tablet, Rfl: 3   Fluocinolone Acetonide 0.01 % OIL, Place 1-2 drops into both ears daily. As needed, Disp: 20 mL, Rfl: 0   gabapentin (NEURONTIN) 100 MG capsule, TAKE 2 TO 3 CAPSULES AT    BEDTIME, Disp: 180 capsule, Rfl: 3   glucagon (GLUCAGEN) 1 MG SOLR injection, Inject 1 mg into the muscle once as needed for up to 1 dose for low blood sugar.,  Disp: 1 each, Rfl: 11   glucose blood (ONETOUCH ULTRA) test strip, USE 1 STRIP TO CHECK GLUCOSE THREE TIMES DAILY, Disp: 300 each, Rfl: 3   insulin degludec (TRESIBA FLEXTOUCH) 200 UNIT/ML FlexTouch Pen, Inject 34 Units into the skin daily., Disp: 12 mL, Rfl: 3   Insulin Pen Needle 32G X 4 MM MISC, Use 1x a day, Disp: 100 each, Rfl: 3   insulin regular (NOVOLIN R RELION) 100 units/mL injection, Inject 0.2-0.3 mLs (20-30 Units total) into the skin 3 (three) times daily before meals. Per sliding scale, Disp: , Rfl:    Insulin Syringe-Needle U-100 (RELION INSULIN SYRINGE 1ML/31G) 31G X 5/16" 1 ML MISC, USE 2 TIMES A DAY (Patient taking differently: 1 each by Other route 2 (two) times daily.), Disp: 100 each, Rfl: 2   irbesartan-hydrochlorothiazide (AVALIDE) 150-12.5 MG tablet, Take 1 tablet by mouth daily., Disp: 90 tablet, Rfl: 3   metFORMIN (GLUCOPHAGE) 1000 MG tablet, TAKE 1 TABLET TWICE DAILY  WITH  MEALS, Disp: 180 tablet, Rfl: 1   metoprolol succinate (TOPROL-XL) 25 MG 24 hr tablet, Take 1 tablet (25 mg total) by mouth daily., Disp: 90 tablet, Rfl: 2   omeprazole (PRILOSEC) 40 MG capsule, TAKE 1 CAPSULE TWICE DAILY, Disp: 180 capsule, Rfl: 3   OVER THE COUNTER MEDICATION, Apply 1-3 application topically at bedtime as needed (foot pain). TOPRICIN FOOT CREAM - NEUROPATHY FOOT CREAM **APPLIES TO BOTH FEET AT BEDTIME**, Disp: , Rfl:    Probiotic Product (PROBIOTIC DAILY PO), Take 1 capsule by mouth daily., Disp: , Rfl:    rosuvastatin (CRESTOR) 20 MG tablet, TAKE 1 TABLET DAILY, Disp: 90 tablet, Rfl: 3   vitamin B-12 (CYANOCOBALAMIN) 1000 MCG tablet, Take 1,000 mcg daily by mouth., Disp: , Rfl:    vitamin C (ASCORBIC ACID) 500 MG tablet, Take 500 mg by mouth daily., Disp: , Rfl:   Observations/Objective: Patient is well-developed, well-nourished in no acute distress.  Resting comfortably at home.  Head is normocephalic, atraumatic.  No labored breathing.  Speech is clear and coherent with logical  content.  Patient is alert and oriented at baseline.    Assessment and Plan: 1. Acute bacterial bronchitis - azithromycin (ZITHROMAX) 250 MG tablet; Take 2 tablets on day 1, then 1 tablet daily on days 2 through 5  Dispense: 6 tablet; Refill: 0  2. Antibiotic-induced yeast infection - fluconazole (DIFLUCAN) 150 MG tablet; Take 1 tablet (150 mg total) by mouth every 3 (three) days as needed.  Dispense: 3 tablet; Refill: 0 - MICONAZOLE NITRATE VAGINAL (MICONAZOLE 3) 4 % CREA; Use topically twice daily for vaginal irritation and in groin  Dispense: 25 g; Refill: 0  - Worsening over a week despite OTC medications - Will treat with Z-pack  - Can continue Mucinex  - Push fluids.  - Rest.  - Steam and humidifier can help - Diflucan given as prophylaxis as patient tends to get vaginal yeast infections with antibiotic use, miconazole for topical irritation - Seek in person evaluation if worsening or symptoms fail to improve    Follow Up Instructions: I discussed the assessment and treatment plan with the patient. The patient was provided an opportunity to ask questions and all were answered. The patient agreed with the plan and demonstrated an understanding of the instructions.  A copy of instructions were sent to the patient via MyChart unless otherwise noted below.    The patient was advised to call back or seek an in-person evaluation if the symptoms worsen or if the condition fails to improve as anticipated.  Time:  I spent 10 minutes with the patient via telehealth technology discussing the above problems/concerns.    Mar Daring, PA-C

## 2022-01-07 NOTE — Patient Instructions (Signed)
Ammie Ferrier, thank you for joining Mar Daring, PA-C for today's virtual visit.  While this provider is not your primary care provider (PCP), if your PCP is located in our provider database this encounter information will be shared with them immediately following your visit.   [facility] account gives you access to today's visit and all your visits, tests, and labs performed at [facility] " click here if you don't have a [facility] account or go to [email or web address]  Consent: (Patient) Beth Holden provided verbal consent for this virtual visit at the beginning of the encounter.  Current Medications:  Current Outpatient Medications:    azithromycin (ZITHROMAX) 250 MG tablet, Take 2 tablets on day 1, then 1 tablet daily on days 2 through 5, Disp: 6 tablet, Rfl: 0   fluconazole (DIFLUCAN) 150 MG tablet, Take 1 tablet (150 mg total) by mouth every 3 (three) days as needed., Disp: 3 tablet, Rfl: 0   MICONAZOLE NITRATE VAGINAL (MICONAZOLE 3) 4 % CREA, Use topically twice daily for vaginal irritation and in groin, Disp: 25 g, Rfl: 0   acetaminophen (TYLENOL) 500 MG tablet, Take 500 mg by mouth every 8 (eight) hours as needed for mild pain. , Disp: , Rfl:    azelastine (ASTELIN) 0.1 % nasal spray, Place 2 sprays into both nostrils 2 (two) times daily., Disp: 30 mL, Rfl: 12   betamethasone dipropionate (DIPROLENE) 0.05 % cream, Apply 1 application topically 2 (two) times daily as needed (IRRITATION)., Disp: 30 g, Rfl: 0   Blood Glucose Monitoring Suppl (ONE TOUCH ULTRA 2) w/Device KIT, Use to check blood sugar, Disp: 1 kit, Rfl: 0   Calcium Carb-Cholecalciferol (CALCIUM + VITAMIN D3) 600-10 MG-MCG TABS, Take 2 tablets by mouth daily., Disp: 90 tablet, Rfl: 3   cetirizine (ZYRTEC) 10 MG tablet, Take 1 tablet (10 mg total) by mouth daily., Disp: 90 tablet, Rfl: 3   Fluocinolone Acetonide 0.01 % OIL, Place 1-2 drops into both ears daily. As needed,  Disp: 20 mL, Rfl: 0   gabapentin (NEURONTIN) 100 MG capsule, TAKE 2 TO 3 CAPSULES AT    BEDTIME, Disp: 180 capsule, Rfl: 3   glucagon (GLUCAGEN) 1 MG SOLR injection, Inject 1 mg into the muscle once as needed for up to 1 dose for low blood sugar., Disp: 1 each, Rfl: 11   glucose blood (ONETOUCH ULTRA) test strip, USE 1 STRIP TO CHECK GLUCOSE THREE TIMES DAILY, Disp: 300 each, Rfl: 3   insulin degludec (TRESIBA FLEXTOUCH) 200 UNIT/ML FlexTouch Pen, Inject 34 Units into the skin daily., Disp: 12 mL, Rfl: 3   Insulin Pen Needle 32G X 4 MM MISC, Use 1x a day, Disp: 100 each, Rfl: 3   insulin regular (NOVOLIN R RELION) 100 units/mL injection, Inject 0.2-0.3 mLs (20-30 Units total) into the skin 3 (three) times daily before meals. Per sliding scale, Disp: , Rfl:    Insulin Syringe-Needle U-100 (RELION INSULIN SYRINGE 1ML/31G) 31G X 5/16" 1 ML MISC, USE 2 TIMES A DAY (Patient taking differently: 1 each by Other route 2 (two) times daily.), Disp: 100 each, Rfl: 2   irbesartan-hydrochlorothiazide (AVALIDE) 150-12.5 MG tablet, Take 1 tablet by mouth daily., Disp: 90 tablet, Rfl: 3   metFORMIN (GLUCOPHAGE) 1000 MG tablet, TAKE 1 TABLET TWICE DAILY  WITH MEALS, Disp: 180 tablet, Rfl: 1   metoprolol succinate (TOPROL-XL) 25 MG 24 hr tablet, Take 1 tablet (25 mg total) by mouth daily., Disp: 90 tablet, Rfl: 2  omeprazole (PRILOSEC) 40 MG capsule, TAKE 1 CAPSULE TWICE DAILY, Disp: 180 capsule, Rfl: 3   OVER THE COUNTER MEDICATION, Apply 1-3 application topically at bedtime as needed (foot pain). TOPRICIN FOOT CREAM - NEUROPATHY FOOT CREAM **APPLIES TO BOTH FEET AT BEDTIME**, Disp: , Rfl:    Probiotic Product (PROBIOTIC DAILY PO), Take 1 capsule by mouth daily., Disp: , Rfl:    rosuvastatin (CRESTOR) 20 MG tablet, TAKE 1 TABLET DAILY, Disp: 90 tablet, Rfl: 3   vitamin B-12 (CYANOCOBALAMIN) 1000 MCG tablet, Take 1,000 mcg daily by mouth., Disp: , Rfl:    vitamin C (ASCORBIC ACID) 500 MG tablet, Take 500 mg by mouth  daily., Disp: , Rfl:    Medications ordered in this encounter:  Meds ordered this encounter  Medications   azithromycin (ZITHROMAX) 250 MG tablet    Sig: Take 2 tablets on day 1, then 1 tablet daily on days 2 through 5    Dispense:  6 tablet    Refill:  0    Order Specific Question:   Supervising Provider    Answer:   Chase Picket [[id number]]   fluconazole (DIFLUCAN) 150 MG tablet    Sig: Take 1 tablet (150 mg total) by mouth every 3 (three) days as needed.    Dispense:  3 tablet    Refill:  0    Order Specific Question:   Supervising Provider    Answer:   Chase Picket [[id number]]   MICONAZOLE NITRATE VAGINAL (MICONAZOLE 3) 4 % CREA    Sig: Use topically twice daily for vaginal irritation and in groin    Dispense:  25 g    Refill:  0    Order Specific Question:   Supervising Provider    Answer:   Chase Picket [id number]     *If you need refills on other medications prior to your next appointment, please contact your pharmacy*  Follow-Up: Call back or seek an in-person evaluation if the symptoms worsen or if the condition fails to improve as anticipated.  [place] [phone]  Other Instructions Acute Bronchitis, Adult  Acute bronchitis is when air tubes in the lungs (bronchi) suddenly get swollen. The condition can make it hard for you to breathe. In adults, acute bronchitis usually goes away within 2 weeks. A cough caused by bronchitis may last up to 3 weeks. Smoking, allergies, and asthma can make the condition worse. What are the causes? Germs that cause cold and flu (viruses). The most common cause of this condition is the virus that causes the common cold. Bacteria. Substances that bother (irritate) the lungs, including: Smoke from cigarettes and other types of tobacco. Dust and pollen. Fumes from chemicals, gases, or burned fuel. Indoor or outdoor air pollution. What increases the risk? A weak body's defense system. This is also called  the immune system. Any condition that affects your lungs and breathing, such as asthma. What are the signs or symptoms? A cough. Coughing up clear, yellow, or green mucus. Making high-pitched whistling sounds when you breathe, most often when you breathe out (wheezing). Runny or stuffy nose. Having too much mucus in your lungs (chest congestion). Shortness of breath. Body aches. A sore throat. How is this treated? Acute bronchitis may go away over time without treatment. Your doctor may tell you to: Drink more fluids. This will help thin your mucus so it is easier to cough up. Use a device that gets medicine into your lungs (inhaler). Use a vaporizer or  a humidifier. These are machines that add water to the air. This helps with coughing and poor breathing. Take a medicine that thins mucus and helps clear it from your lungs. Take a medicine that prevents or stops coughing. It is not common to take an antibiotic medicine for this condition. Follow these instructions at home:  Take over-the-counter and prescription medicines only as told by your doctor. Use an inhaler, vaporizer, or humidifier as told by your doctor. Take two teaspoons (10 mL) of honey at bedtime. This helps lessen your coughing at night. Drink enough fluid to keep your pee (urine) pale yellow. Do not smoke or use any products that contain nicotine or tobacco. If you need help quitting, ask your doctor. Get a lot of rest. Return to your normal activities when your doctor says that it is safe. Keep all follow-up visits. How is this prevented?  Wash your hands often with soap and water for at least 20 seconds. If you cannot use soap and water, use hand sanitizer. Avoid contact with people who have cold symptoms. Try not to touch your mouth, nose, or eyes with your hands. Avoid breathing in smoke or chemical fumes. Make sure to get the flu shot every year. Contact a doctor if: Your symptoms do not get better in 2  weeks. You have trouble coughing up the mucus. Your cough keeps you awake at night. You have a fever. Get help right away if: You cough up blood. You have chest pain. You have very bad shortness of breath. You faint or keep feeling like you are going to faint. You have a very bad headache. Your fever or chills get worse. These symptoms may be an emergency. Get help right away. Call your local emergency services (911 in the [place] Do not wait to see if the symptoms will go away. Do not drive yourself to the hospital. Summary Acute bronchitis is when air tubes in the lungs (bronchi) suddenly get swollen. In adults, acute bronchitis usually goes away within 2 weeks. Drink more fluids. This will help thin your mucus so it is easier to cough up. Take over-the-counter and prescription medicines only as told by your doctor. Contact a doctor if your symptoms do not improve after 2 weeks of treatment. This information is not intended to replace advice given to you by your health care provider. Make sure you discuss any questions you have with your health care provider. Document Revised: 04/29/2020 Document Reviewed: 04/29/2020 Elsevier Patient Education  [place].    If you have been instructed to have an in-person evaluation today at a local Urgent Care facility, please use the link below. It will take you to a list of all of our available [facility], including address, phone number and hours of operation. Please do not delay care.  [facility]  If you or a family member do not have a primary care provider, use the link below to schedule a visit and establish care. When you choose a  primary care physician or advanced practice provider, you gain a long-term partner in health. Find a Primary Care Provider  Learn more about 's in-office and virtual care options: [facility]

## 2022-01-12 ENCOUNTER — Encounter: Payer: Self-pay | Admitting: Nurse Practitioner

## 2022-01-12 ENCOUNTER — Ambulatory Visit: Payer: Medicare Other | Admitting: Internal Medicine

## 2022-01-22 ENCOUNTER — Encounter: Payer: Self-pay | Admitting: Nurse Practitioner

## 2022-01-27 ENCOUNTER — Encounter: Payer: Self-pay | Admitting: Nurse Practitioner

## 2022-01-27 ENCOUNTER — Encounter: Payer: Self-pay | Admitting: Orthopedic Surgery

## 2022-01-27 ENCOUNTER — Ambulatory Visit (INDEPENDENT_AMBULATORY_CARE_PROVIDER_SITE_OTHER): Payer: Medicare Other | Admitting: Orthopedic Surgery

## 2022-01-27 DIAGNOSIS — L97511 Non-pressure chronic ulcer of other part of right foot limited to breakdown of skin: Secondary | ICD-10-CM

## 2022-01-27 NOTE — Progress Notes (Signed)
Office Visit Note   [name]           Date of Birth: Jun 28, 1945           MRN: [id number] Visit Date: 01/27/2022              Requested by: Vivi Barrack, [address],  [place] [address] PCP: Vivi Barrack, MD  Chief Complaint  Patient presents with   Left Foot - Wound Check, Follow-up   Right Foot - Wound Check, Follow-up      HPI: Patient is a 76 year old woman who is seen in follow-up for examination of both feet.  She has had a chronic Wagner grade 1 ulcer plantar aspect the right foot and hypertrophic callus over the left transmetatarsal amputation.  Assessment & Plan: Visit Diagnoses:  1. Non-pressure chronic ulcer of other part of right foot limited to breakdown of skin ([place])     Plan: The ulcer was debrided callus pared nails trimmed x 2.  Follow-Up Instructions: Return in about 4 weeks (around 02/24/2022).   Ortho Exam  Patient is alert, oriented, no adenopathy, well-dressed, normal affect, normal respiratory effort. Examination patient has thickened discolored onychomycotic nails on the right x 2.  The nails were trimmed without complications.  No signs of infection.  Patient has a Wagner grade 1 ulcer beneath the right second metatarsal head.  After informed consent a 10 blade knife was used to debride back skin and soft tissue back to healthy viable granulation tissue.  Ulcer was 2 cm in diameter and 2 mm deep after debridement.  Silver nitrate was used for hemostasis.  Callus was pared on the left foot without complication.  Imaging: No results found. No images are attached to the encounter.  Labs: Lab Results  Component Value Date   HGBA1C 9.0 (A) 09/09/2021   HGBA1C 7.8 (A) 05/06/2021   HGBA1C 8.1 (H) 08/12/2020   ESRSEDRATE 51 (H) 12/24/2019   ESRSEDRATE 36 (H) 02/24/2017   CRP 13.2 (H) 12/24/2019   CRP 1.2 (H) 02/24/2017   LABURIC 5.5 09/01/2015   REPTSTATUS 06/03/2020 FINAL 05/29/2020   CULT  05/29/2020    NO  GROWTH 5 DAYS Performed at [facility], [place] [address], [place], [place] [address]    LABORGA GROUP B STREP (S.AGALACTIAE) ISOLATED 12/26/2014     Lab Results  Component Value Date   ALBUMIN 4.1 11/05/2021   ALBUMIN 4.1 11/05/2021   ALBUMIN 3.8 09/08/2020   PREALBUMIN 19.4 03/05/2012    No results found for: "MG" No results found for: "VD25OH"  Lab Results  Component Value Date   PREALBUMIN 19.4 03/05/2012      Latest Ref Rng & Units 12/08/2021    7:59 AM 11/05/2021    9:13 AM 10/06/2021    8:10 AM  CBC EXTENDED  WBC 4.0 - 10.5 K/uL 13.1  11.7  9.9   RBC 3.87 - 5.11 MIL/uL 4.21  4.13  4.14   Hemoglobin 12.0 - 15.0 g/dL 12.4  11.8  12.0   HCT 36.0 - 46.0 % 37.6  35.9  36.7   Platelets 150 - 400 K/uL 368  364.0  403   NEUT# 1.7 - 7.7 K/uL 8.6   6.4   Lymph# 0.7 - 4.0 K/uL 3.4   2.4      There is no height or weight on file to calculate BMI.  Orders:  No orders of the defined types were placed in this encounter.  No orders of the defined types were placed in this encounter.    Procedures: No procedures performed  Clinical Data: No additional findings.  ROS:  All other systems negative, except as noted in the HPI. Review of Systems  Objective: Vital Signs: There were no vitals taken for this visit.  Specialty Comments:  No specialty comments available.  PMFS History: Patient Active Problem List   Diagnosis Date Noted   Osteopenia Last DEXA 11/23 11/18/2021   CKD stage 3 due to type 2 diabetes mellitus ([place]) 11/10/2021   Mild cognitive impairment 11/10/2021   Coccyx pain 12/18/2020   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[place]) 10/28/2020   Memory loss 11/21/2019   Mild nonproliferative diabetic retinopathy of both eyes ([place]) 10/23/2019   Nuclear sclerotic cataract of both eyes 10/23/2019   Posterior vitreous detachment of right eye 10/23/2019   Retinal hemorrhage of left eye 10/23/2019   Impingement syndrome of right shoulder  07/06/2017   Iron deficiency anemia 05/11/2017   Morbid obesity ([place]) 04/24/2017   Anemia 02/24/2017   Baker's cyst 07/10/2016   Onychomycosis 12/07/2015   Upper airway cough syndrome 03/05/2014   Hypertension associated with diabetes ([place]) 12/05/2006   T2DM (type 2 diabetes mellitus) ([place]) 10/12/2006   Dyslipidemia associated with type 2 diabetes mellitus ([place]) 10/12/2006   Allergic rhinitis 10/12/2006   GERD - Followed by Dr. Fuller Plan 10/12/2006   IRRITABLE BOWEL SYNDROME - followed by Dr. Fuller Plan 10/12/2006   Peripheral neuropathy 10/11/2006   ALOPECIA NEC 10/11/2006   Past Medical History:  Diagnosis Date   Alopecia    Anemia, mild    Arthritis    Chronic cough    sees pulmonologist   Chronic pain    chest wall and abd - s/p extensive eval   Diabetes mellitus with neuropathy (HCC)    sees endocrine   Diverticulosis    Dyspnea    Fatty liver    GERD (gastroesophageal reflux disease)    takes Nexium bid, hx erosive esophagitis   Headache(784.0)    occasionally;r/t sinus    History of colon polyps    HTN (hypertension)    Hx of amputation of lesser toe (HCC)    sees podiatrist   Hyperlipemia    IBS (irritable bowel syndrome)    Insomnia    takes Elavil nightly   Joint pain    Neuropathy    Neuropathy    Osteomyelitis ([place])    Pneumonia    89/2/22- patient denies   PONV (postoperative nausea and vomiting)    medication did not help with surgery on 03/28/20   Seasonal allergies    takes Zyrtec daily   Sinus tachycardia     Family History  Problem Relation Age of Onset   Lung cancer Mother 76       smoked heavily   Emphysema Mother    Hypertension Father    Hyperlipidemia Father    Diabetes Father    Coronary artery disease Father    Dementia Father    COPD Father        smoked   Stomach cancer Paternal Aunt    Brain cancer Paternal Uncle    Irritable bowel syndrome Other        Several family members on fathers side    Diabetes Other    Stomach cancer  Maternal Aunt    Lung cancer Paternal Uncle    Heart disease Paternal Uncle    Colon cancer Neg Hx  Past Surgical History:  Procedure Laterality Date   AMPUTATION  12/28/2011   Procedure: AMPUTATION DIGIT;  Surgeon: Newt Minion, MD;  Location: [place] [facility];  Service: Orthopedics;  Laterality: Right;  Right Foot 2nd Toe Amputation at MTP (metatarsophalangeal joint)   AMPUTATION Right 04/25/2012   Procedure: Right Foot 3rd Toe Amputation;  Surgeon: Newt Minion, MD;  Location: [facility];  Service: Orthopedics;  Laterality: Right;  Right Foot Third Toe Amputation    AMPUTATION Right 07/27/2012   Procedure: Right 4th Toe Amputation at Metatarsophalangeal;  Surgeon: Newt Minion, MD;  Location: [facility];  Service: Orthopedics;  Laterality: Right;  Right 4th Toe Amputation at Metatarsophalangeal   AMPUTATION Left 07/15/2016   Procedure: Left 2nd Ray Amputation;  Surgeon: Newt Minion, MD;  Location: [facility];  Service: Orthopedics;  Laterality: Left;   AMPUTATION Left 11/18/2016   Procedure: Left 3rd and 4th Ray Amputation;  Surgeon: Newt Minion, MD;  Location: [facility];  Service: Orthopedics;  Laterality: Left;   AMPUTATION Right 03/29/2017   Procedure: RIGHT FOOT 3RD AND 4TH RAY AMPUTATION;  Surgeon: Newt Minion, MD;  Location: [facility];  Service: Orthopedics;  Laterality: Right;   AMPUTATION Left 08/12/2020   Procedure: LEFT TRANSMETATARSAL AMPUTATION;  Surgeon: Newt Minion, MD;  Location: [facility];  Service: Orthopedics;  Laterality: Left;   APPLICATION OF WOUND VAC Left 08/12/2020   Procedure: APPLICATION OF WOUND VAC;  Surgeon: Newt Minion, MD;  Location: [facility];  Service: Orthopedics;  Laterality: Left;   COLONOSCOPY     LAPAROSCOPIC APPENDECTOMY  01/05/2011   Procedure: APPENDECTOMY LAPAROSCOPIC;  Surgeon: Pedro Earls, MD;  Location: [facility];  Service: General;  Laterality: N/A;   OOPHORECTOMY  2001   ROTATOR CUFF REPAIR Right    x 2   TUBAL LIGATION     VAGINAL HYSTERECTOMY  2001   Social  History   Occupational History   Occupation: Retired   Tobacco Use   Smoking status: Never   Smokeless tobacco: Never  Vaping Use   Vaping Use: Never used  Substance and Sexual Activity   Alcohol use: Never   Drug use: Never   Sexual activity: Yes    Birth control/protection: Surgical

## 2022-02-03 ENCOUNTER — Other Ambulatory Visit: Payer: Self-pay

## 2022-02-03 DIAGNOSIS — E1165 Type 2 diabetes mellitus with hyperglycemia: Secondary | ICD-10-CM

## 2022-02-03 MED ORDER — TRESIBA FLEXTOUCH 200 UNIT/ML ~~LOC~~ SOPN: 34.0000 [IU] | PEN_INJECTOR | Freq: Every day | SUBCUTANEOUS | 3 refills | Status: DC

## 2022-02-21 ENCOUNTER — Encounter: Payer: Self-pay | Admitting: Family

## 2022-02-21 ENCOUNTER — Ambulatory Visit (INDEPENDENT_AMBULATORY_CARE_PROVIDER_SITE_OTHER): Payer: Medicare Other | Admitting: Family

## 2022-02-21 VITALS — BP 103/66 | HR 62 | Temp 97.5°F | Ht 66.0 in | Wt 217.2 lb

## 2022-02-21 DIAGNOSIS — B9789 Other viral agents as the cause of diseases classified elsewhere: Secondary | ICD-10-CM | POA: Diagnosis not present

## 2022-02-21 DIAGNOSIS — J309 Allergic rhinitis, unspecified: Secondary | ICD-10-CM

## 2022-02-21 DIAGNOSIS — J329 Chronic sinusitis, unspecified: Secondary | ICD-10-CM

## 2022-02-21 MED ORDER — CETIRIZINE HCL 10 MG PO TABS: 10.0000 mg | ORAL_TABLET | Freq: Every day | ORAL | 11 refills | Status: DC

## 2022-02-21 NOTE — Patient Instructions (Addendum)
It was very nice to see you today!   Use the saline nasal spray 3 times a day and before using your other medicated sprays: Use the Azelastine nasal spray 1 squirt twice a day. Use the Fluticasone nasal spray 1 squirt twice a day for 3 days, then just daily. I have sent in a new RX for your Cetirizine (Zyrtec) pills to [facility] - if not covered by your insurance after you give them your insurance card info, then tell them to use the [facility] care and it should be just around $3.  Remember to drink at least 8 cups of water daily to thin the mucus and helps you feel better!      PLEASE NOTE:  If you had any lab tests please let us know if you have not heard back within a few days. You may see your results on MyChart before we have a chance to review them but we will give you a call once they are reviewed by [place]. If we ordered any referrals today, please let us know if you have not heard from their office within the next week.

## 2022-02-21 NOTE — Progress Notes (Signed)
Patient ID: Beth Holden, female    DOB: 09/15/1945, 77 y.o.   MRN: [id number]  Chief Complaint  Patient presents with   Sinus Problem    Sx for 2 weeks, has battled sx off & on for 2 months    HPI:      URI sx:  treated in Dec w/Zpack and felt a little better, but then sx all returned. Tested neg for covid. ead congestion, bilateral ear pain, runny nose and Dry cough since November off and on but worse in the past couple of weeks. Has tried tylenol, nasal spray, ear drops and mucinex which did not help. Zyrtec helped in the past, insurance doesn't cover.   Assessment & Plan:  1. Allergic rhinitis, unspecified seasonality, unspecified trigger has not had her Zyrtec since November and this helped all her sinus sx. Resending to local [facility] and given GoodRX card to use, should only cost around $3 for 30 pills. Advised to restart Astelin & Flonase nasal sprays and advised on proper use, using saline nasal spray beforehand.  - cetirizine (ZYRTEC) 10 MG tablet; Take 1 tablet (10 mg total) by mouth daily.  Dispense: 30 tablet; Refill: 11  2. Viral sinusitis - resending her generic Zyrtec to local pharmacy. Advised to restart Astelin & Flonase nasal sprays and advised on proper use, using saline nasal spray beforehand. Increase water intake to 2 liters per day, use humidifier overnight.   Subjective:    Outpatient Medications Prior to Visit  Medication Sig Dispense Refill   acetaminophen (TYLENOL) 500 MG tablet Take 500 mg by mouth every 8 (eight) hours as needed for mild pain.      azelastine (ASTELIN) 0.1 % nasal spray Place 2 sprays into both nostrils 2 (two) times daily. 30 mL 12   betamethasone dipropionate (DIPROLENE) 0.05 % cream Apply 1 application topically 2 (two) times daily as needed (IRRITATION). 30 g 0   Blood Glucose Monitoring Suppl (ONE TOUCH ULTRA 2) w/Device KIT Use to check blood sugar 1 kit 0   Calcium Carb-Cholecalciferol (CALCIUM + VITAMIN D3) 600-10 MG-MCG TABS  Take 2 tablets by mouth daily. 90 tablet 3   fluconazole (DIFLUCAN) 150 MG tablet Take 1 tablet (150 mg total) by mouth every 3 (three) days as needed. 3 tablet 0   Fluocinolone Acetonide 0.01 % OIL Place 1-2 drops into both ears daily. As needed 20 mL 0   gabapentin (NEURONTIN) 100 MG capsule TAKE 2 TO 3 CAPSULES AT    BEDTIME 180 capsule 3   glucagon (GLUCAGEN) 1 MG SOLR injection Inject 1 mg into the muscle once as needed for up to 1 dose for low blood sugar. 1 each 11   glucose blood (ONETOUCH ULTRA) test strip USE 1 STRIP TO CHECK GLUCOSE THREE TIMES DAILY 300 each 3   insulin degludec (TRESIBA FLEXTOUCH) 200 UNIT/ML FlexTouch Pen Inject 34 Units into the skin daily. 12 mL 3   Insulin Pen Needle 32G X 4 MM MISC Use 1x a day 100 each 3   insulin regular (NOVOLIN R RELION) 100 units/mL injection Inject 0.2-0.3 mLs (20-30 Units total) into the skin 3 (three) times daily before meals. Per sliding scale     Insulin Syringe-Needle U-100 (RELION INSULIN SYRINGE 1ML/31G) 31G X 5/16" 1 ML MISC USE 2 TIMES A DAY (Patient taking differently: 1 each by Other route 2 (two) times daily.) 100 each 2   irbesartan-hydrochlorothiazide (AVALIDE) 150-12.5 MG tablet Take 1 tablet by mouth daily. 90 tablet 3  metFORMIN (GLUCOPHAGE) 1000 MG tablet TAKE 1 TABLET TWICE DAILY  WITH MEALS 180 tablet 1   metoprolol succinate (TOPROL-XL) 25 MG 24 hr tablet Take 1 tablet (25 mg total) by mouth daily. 90 tablet 2   MICONAZOLE NITRATE VAGINAL (MICONAZOLE 3) 4 % CREA Use topically twice daily for vaginal irritation and in groin 25 g 0   omeprazole (PRILOSEC) 40 MG capsule TAKE 1 CAPSULE TWICE DAILY 180 capsule 3   OVER THE COUNTER MEDICATION Apply 1-3 application topically at bedtime as needed (foot pain). TOPRICIN FOOT CREAM - NEUROPATHY FOOT CREAM **APPLIES TO BOTH FEET AT BEDTIME**     Probiotic Product (PROBIOTIC DAILY PO) Take 1 capsule by mouth daily.     rosuvastatin (CRESTOR) 20 MG tablet TAKE 1 TABLET DAILY 90 tablet  3   vitamin B-12 (CYANOCOBALAMIN) 1000 MCG tablet Take 1,000 mcg daily by mouth.     vitamin C (ASCORBIC ACID) 500 MG tablet Take 500 mg by mouth daily.     cetirizine (ZYRTEC) 10 MG tablet Take 1 tablet (10 mg total) by mouth daily. 90 tablet 3   No facility-administered medications prior to visit.   Past Medical History:  Diagnosis Date   Alopecia    Anemia, mild    Arthritis    Chronic cough    sees pulmonologist   Chronic pain    chest wall and abd - s/p extensive eval   Diabetes mellitus with neuropathy (HCC)    sees endocrine   Diverticulosis    Dyspnea    Fatty liver    GERD (gastroesophageal reflux disease)    takes Nexium bid, hx erosive esophagitis   Headache(784.0)    occasionally;r/t sinus    History of colon polyps    HTN (hypertension)    Hx of amputation of lesser toe (HCC)    sees podiatrist   Hyperlipemia    IBS (irritable bowel syndrome)    Insomnia    takes Elavil nightly   Joint pain    Neuropathy    Neuropathy    Osteomyelitis ([place])    Pneumonia    89/2/22- patient denies   PONV (postoperative nausea and vomiting)    medication did not help with surgery on 03/28/20   Seasonal allergies    takes Zyrtec daily   Sinus tachycardia    Past Surgical History:  Procedure Laterality Date   AMPUTATION  12/28/2011   Procedure: AMPUTATION DIGIT;  Surgeon: Newt Minion, MD;  Location: [facility];  Service: Orthopedics;  Laterality: Right;  Right Foot 2nd Toe Amputation at MTP (metatarsophalangeal joint)   AMPUTATION Right 04/25/2012   Procedure: Right Foot 3rd Toe Amputation;  Surgeon: Newt Minion, MD;  Location: [facility];  Service: Orthopedics;  Laterality: Right;  Right Foot Third Toe Amputation    AMPUTATION Right 07/27/2012   Procedure: Right 4th Toe Amputation at Metatarsophalangeal;  Surgeon: Newt Minion, MD;  Location: [facility];  Service: Orthopedics;  Laterality: Right;  Right 4th Toe Amputation at Metatarsophalangeal   AMPUTATION Left 07/15/2016    Procedure: Left 2nd Ray Amputation;  Surgeon: Newt Minion, MD;  Location: [facility];  Service: Orthopedics;  Laterality: Left;   AMPUTATION Left 11/18/2016   Procedure: Left 3rd and 4th Ray Amputation;  Surgeon: Newt Minion, MD;  Location: [facility];  Service: Orthopedics;  Laterality: Left;   AMPUTATION Right 03/29/2017   Procedure: RIGHT FOOT 3RD AND 4TH RAY AMPUTATION;  Surgeon: Newt Minion, MD;  Location: [facility];  Service: Orthopedics;  Laterality: Right;   AMPUTATION Left 08/12/2020   Procedure: LEFT TRANSMETATARSAL AMPUTATION;  Surgeon: Newt Minion, MD;  Location: [facility];  Service: Orthopedics;  Laterality: Left;   APPLICATION OF WOUND VAC Left 08/12/2020   Procedure: APPLICATION OF WOUND VAC;  Surgeon: Newt Minion, MD;  Location: [facility];  Service: Orthopedics;  Laterality: Left;   COLONOSCOPY     LAPAROSCOPIC APPENDECTOMY  01/05/2011   Procedure: APPENDECTOMY LAPAROSCOPIC;  Surgeon: Pedro Earls, MD;  Location: [facility];  Service: General;  Laterality: N/A;   OOPHORECTOMY  2001   ROTATOR CUFF REPAIR Right    x 2   TUBAL LIGATION     VAGINAL HYSTERECTOMY  2001   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  Constitutional:      Appearance: Normal appearance. She is not ill-appearing.     Interventions: Face mask in place.  HENT:     Right Ear: Tympanic membrane and ear canal normal.     Left Ear: Tympanic membrane and ear canal normal.     Nose: Congestion and rhinorrhea present.     Right Sinus: No frontal sinus tenderness.     Left Sinus: No frontal sinus tenderness.     Mouth/Throat:     Mouth: Mucous membranes are moist.     Pharynx: Posterior oropharyngeal erythema present. No pharyngeal swelling, oropharyngeal exudate or uvula swelling.     Tonsils: No  tonsillar exudate or tonsillar abscesses.  Cardiovascular:     Rate and Rhythm: Normal rate and regular rhythm.  Pulmonary:     Effort: Pulmonary effort is normal.     Breath sounds: Normal breath sounds.  Musculoskeletal:        General: Normal range of motion.  Lymphadenopathy:     Head:     Right side of head: No preauricular or posterior auricular adenopathy.     Left side of head: No preauricular or posterior auricular adenopathy.     Cervical: No cervical adenopathy.  Skin:    General: Skin is warm and dry.  Neurological:     Mental Status: She is alert.  Psychiatric:        Mood and Affect: Mood normal.        Behavior: Behavior normal.    BP 103/66 (BP Location: Left Arm, Patient Position: Sitting, Cuff Size: Large)   Pulse 62   Temp (!) 97.5 F (36.4 C) (Temporal)   Ht 5' 6"$  (1.676 m)   Wt 217 lb 4 oz (98.5 kg)   SpO2 94%   BMI 35.07 kg/m  Wt Readings from Last 3 Encounters:  02/21/22 217 lb 4 oz (98.5 kg)  12/08/21 225 lb 12.8 oz (102.4 kg)  11/18/21 228 lb (103.4 kg)      Jeanie Sewer, NP

## 2022-02-24 ENCOUNTER — Ambulatory Visit (INDEPENDENT_AMBULATORY_CARE_PROVIDER_SITE_OTHER): Payer: Medicare Other | Admitting: Orthopedic Surgery

## 2022-02-24 DIAGNOSIS — L97511 Non-pressure chronic ulcer of other part of right foot limited to breakdown of skin: Secondary | ICD-10-CM

## 2022-02-25 ENCOUNTER — Encounter: Payer: Self-pay | Admitting: Orthopedic Surgery

## 2022-02-25 NOTE — Progress Notes (Signed)
Office Visit Note   [name]           Date of Birth: 10-08-1945           MRN: [id number] Visit Date: 02/24/2022              Requested by: Vivi Barrack, [place] [place] [address] [place],  [place] [address] PCP: Vivi Barrack, MD  Chief Complaint  Patient presents with   Right Foot - Wound Check   Left Foot - Wound Check      HPI: Patient is a 77 year old woman who presents in follow-up for left foot transmetatarsal amputation and a plantar ulcer right foot.  Assessment & Plan: Visit Diagnoses:  1. Non-pressure chronic ulcer of other part of right foot limited to breakdown of skin ([place])     Plan: Ulcer debrided.  Recommended she use a Dr. Felicie Morn donut over the ulcer at the base of the fifth metatarsal medial cuneiform.  Follow-Up Instructions: Return in about 4 weeks (around 03/24/2022).   Ortho Exam  Patient is alert, oriented, no adenopathy, well-dressed, normal affect, normal respiratory effort. Examination patient has a callus over the left transmetatarsal amputation this was pared there is no cellulitis or open wounds.  Semination of the right foot patient has a Wagner grade 1 ulcer beneath the fourth metatarsal head.  After informed consent a 10 blade knife was used to debride the skin and soft tissue back to healthy viable granulation tissue.  The ulcer is 10 mm in diameter 1 mm deep after debridement with healthy granulation tissue.  Imaging: No results found. No images are attached to the encounter.  Labs: Lab Results  Component Value Date   HGBA1C 9.0 (A) 09/09/2021   HGBA1C 7.8 (A) 05/06/2021   HGBA1C 8.1 (H) 08/12/2020   ESRSEDRATE 51 (H) 12/24/2019   ESRSEDRATE 36 (H) 02/24/2017   CRP 13.2 (H) 12/24/2019   CRP 1.2 (H) 02/24/2017   LABURIC 5.5 09/01/2015   REPTSTATUS 06/03/2020 FINAL 05/29/2020   CULT  05/29/2020    NO GROWTH 5 DAYS Performed at [facility], [place] [address] [place], [place] [address]    LABORGA GROUP B STREP  (S.AGALACTIAE) ISOLATED 12/26/2014     Lab Results  Component Value Date   ALBUMIN 4.1 11/05/2021   ALBUMIN 4.1 11/05/2021   ALBUMIN 3.8 09/08/2020   PREALBUMIN 19.4 03/05/2012    No results found for: "MG" No results found for: "VD25OH"  Lab Results  Component Value Date   PREALBUMIN 19.4 03/05/2012      Latest Ref Rng & Units 12/08/2021    7:59 AM 11/05/2021    9:13 AM 10/06/2021    8:10 AM  CBC EXTENDED  WBC 4.0 - 10.5 K/uL 13.1  11.7  9.9   RBC 3.87 - 5.11 MIL/uL 4.21  4.13  4.14   Hemoglobin 12.0 - 15.0 g/dL 12.4  11.8  12.0   HCT 36.0 - 46.0 % 37.6  35.9  36.7   Platelets 150 - 400 K/uL 368  364.0  403   NEUT# 1.7 - 7.7 K/uL 8.6   6.4   Lymph# 0.7 - 4.0 K/uL 3.4   2.4      There is no height or weight on file to calculate BMI.  Orders:  No orders of the defined types were placed in this encounter.  No orders of the defined types were placed in this encounter.    Procedures: No procedures performed  Clinical Data:  No additional findings.  ROS:  All other systems negative, except as noted in the HPI. Review of Systems  Objective: Vital Signs: There were no vitals taken for this visit.  Specialty Comments:  No specialty comments available.  PMFS History: Patient Active Problem List   Diagnosis Date Noted   Osteopenia Last DEXA 11/23 11/18/2021   CKD stage 3 due to type 2 diabetes mellitus ([place]) 11/10/2021   Mild cognitive impairment 11/10/2021   Coccyx pain 12/18/2020   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[place]) 10/28/2020   Memory loss 11/21/2019   Mild nonproliferative diabetic retinopathy of both eyes ([place]) 10/23/2019   Nuclear sclerotic cataract of both eyes 10/23/2019   Posterior vitreous detachment of right eye 10/23/2019   Retinal hemorrhage of left eye 10/23/2019   Impingement syndrome of right shoulder 07/06/2017   Iron deficiency anemia 05/11/2017   Morbid obesity ([place]) 04/24/2017   Anemia 02/24/2017   Baker's cyst  07/10/2016   Onychomycosis 12/07/2015   Upper airway cough syndrome 03/05/2014   Hypertension associated with diabetes ([place]) 12/05/2006   T2DM (type 2 diabetes mellitus) ([place]) 10/12/2006   Dyslipidemia associated with type 2 diabetes mellitus ([place]) 10/12/2006   Allergic rhinitis 10/12/2006   GERD - Followed by Dr. Fuller Plan 10/12/2006   IRRITABLE BOWEL SYNDROME - followed by Dr. Fuller Plan 10/12/2006   Peripheral neuropathy 10/11/2006   ALOPECIA NEC 10/11/2006   Past Medical History:  Diagnosis Date   Alopecia    Anemia, mild    Arthritis    Chronic cough    sees pulmonologist   Chronic pain    chest wall and abd - s/p extensive eval   Diabetes mellitus with neuropathy (HCC)    sees endocrine   Diverticulosis    Dyspnea    Fatty liver    GERD (gastroesophageal reflux disease)    takes Nexium bid, hx erosive esophagitis   Headache(784.0)    occasionally;r/t sinus    History of colon polyps    HTN (hypertension)    Hx of amputation of lesser toe (HCC)    sees podiatrist   Hyperlipemia    IBS (irritable bowel syndrome)    Insomnia    takes Elavil nightly   Joint pain    Neuropathy    Neuropathy    Osteomyelitis ([place])    Pneumonia    89/2/22- patient denies   PONV (postoperative nausea and vomiting)    medication did not help with surgery on 03/28/20   Seasonal allergies    takes Zyrtec daily   Sinus tachycardia     Family History  Problem Relation Age of Onset   Lung cancer Mother 69       smoked heavily   Emphysema Mother    Hypertension Father    Hyperlipidemia Father    Diabetes Father    Coronary artery disease Father    Dementia Father    COPD Father        smoked   Stomach cancer Paternal Aunt    Brain cancer Paternal Uncle    Irritable bowel syndrome Other        Several family members on fathers side    Diabetes Other    Stomach cancer Maternal Aunt    Lung cancer Paternal Uncle    Heart disease Paternal Uncle    Colon cancer Neg Hx     Past Surgical  History:  Procedure Laterality Date   AMPUTATION  12/28/2011   Procedure: AMPUTATION DIGIT;  Surgeon: Illene Regulus  Sharol Given, MD;  Location: [facility];  Service: Orthopedics;  Laterality: Right;  Right Foot 2nd Toe Amputation at MTP (metatarsophalangeal joint)   AMPUTATION Right 04/25/2012   Procedure: Right Foot 3rd Toe Amputation;  Surgeon: Newt Minion, MD;  Location: [facility];  Service: Orthopedics;  Laterality: Right;  Right Foot Third Toe Amputation    AMPUTATION Right 07/27/2012   Procedure: Right 4th Toe Amputation at Metatarsophalangeal;  Surgeon: Newt Minion, MD;  Location: [facility];  Service: Orthopedics;  Laterality: Right;  Right 4th Toe Amputation at Metatarsophalangeal   AMPUTATION Left 07/15/2016   Procedure: Left 2nd Ray Amputation;  Surgeon: Newt Minion, MD;  Location: [facility];  Service: Orthopedics;  Laterality: Left;   AMPUTATION Left 11/18/2016   Procedure: Left 3rd and 4th Ray Amputation;  Surgeon: Newt Minion, MD;  Location: [facility];  Service: Orthopedics;  Laterality: Left;   AMPUTATION Right 03/29/2017   Procedure: RIGHT FOOT 3RD AND 4TH RAY AMPUTATION;  Surgeon: Newt Minion, MD;  Location: [facility];  Service: Orthopedics;  Laterality: Right;   AMPUTATION Left 08/12/2020   Procedure: LEFT TRANSMETATARSAL AMPUTATION;  Surgeon: Newt Minion, MD;  Location: [facility];  Service: Orthopedics;  Laterality: Left;   APPLICATION OF WOUND VAC Left 08/12/2020   Procedure: APPLICATION OF WOUND VAC;  Surgeon: Newt Minion, MD;  Location: [facility];  Service: Orthopedics;  Laterality: Left;   COLONOSCOPY     LAPAROSCOPIC APPENDECTOMY  01/05/2011   Procedure: APPENDECTOMY LAPAROSCOPIC;  Surgeon: Pedro Earls, MD;  [facility];  Service: General;  Laterality: N/A;   OOPHORECTOMY  2001   ROTATOR CUFF REPAIR Right    x 2   TUBAL LIGATION     VAGINAL HYSTERECTOMY  2001   Social History   Occupational History   Occupation: Retired   Tobacco Use   Smoking status: Never   Smokeless tobacco: Never   Vaping Use   Vaping Use: Never used  Substance and Sexual Activity   Alcohol use: Never   Drug use: Never   Sexual activity: Yes    Birth control/protection: Surgical

## 2022-03-02 ENCOUNTER — Encounter: Payer: Self-pay | Admitting: Internal Medicine

## 2022-03-02 ENCOUNTER — Ambulatory Visit (INDEPENDENT_AMBULATORY_CARE_PROVIDER_SITE_OTHER): Payer: Medicare Other | Admitting: Internal Medicine

## 2022-03-02 VITALS — BP 120/68 | HR 73 | Ht 66.0 in | Wt 221.8 lb

## 2022-03-02 DIAGNOSIS — H43811 Vitreous degeneration, right eye: Secondary | ICD-10-CM | POA: Diagnosis not present

## 2022-03-02 DIAGNOSIS — E1169 Type 2 diabetes mellitus with other specified complication: Secondary | ICD-10-CM

## 2022-03-02 DIAGNOSIS — G63 Polyneuropathy in diseases classified elsewhere: Secondary | ICD-10-CM | POA: Diagnosis not present

## 2022-03-02 DIAGNOSIS — H25813 Combined forms of age-related cataract, bilateral: Secondary | ICD-10-CM | POA: Diagnosis not present

## 2022-03-02 DIAGNOSIS — E785 Hyperlipidemia, unspecified: Secondary | ICD-10-CM

## 2022-03-02 DIAGNOSIS — E119 Type 2 diabetes mellitus without complications: Secondary | ICD-10-CM | POA: Diagnosis not present

## 2022-03-02 DIAGNOSIS — E1165 Type 2 diabetes mellitus with hyperglycemia: Secondary | ICD-10-CM | POA: Diagnosis not present

## 2022-03-02 LAB — POCT GLYCOSYLATED HEMOGLOBIN (HGB A1C): Hemoglobin A1C: 8.4 % — AB (ref 4.0–5.6)

## 2022-03-02 LAB — HM DIABETES EYE EXAM

## 2022-03-02 MED ORDER — TIRZEPATIDE 2.5 MG/0.5ML ~~LOC~~ SOAJ: 2.5000 mg | SUBCUTANEOUS | 1 refills | Status: DC

## 2022-03-02 NOTE — Patient Instructions (Addendum)
Please continue: - Metformin 1000 mg 2x a day - Tresiba 44 units daily  Insulin Before breakfast Before lunch Before dinner At bedtime  Regular 25-33 - 25-30     Please retry: - Mounjaro 2.5 mg weekly and increase to 5 mg weekly after 1 month - let me know if I need to send the new prescription  Please return in 4 months with your sugar log

## 2022-03-02 NOTE — Progress Notes (Signed)
Subjective:     Beth Holden ID: Beth Holden, female   DOB: 01/01/1946, 77 y.o.   MRN:   HPI Beth Holden is a pleasant 77 y.o. 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  Last visit 6 months ago.  Interim history: Beth Holden has occasional blurry vision, nausea, chest pain, increased urination -unchanged. Beth Holden has memory loss. Beth Holden had Covid 19, flu, another URI - had ABx courses since  Reviewed HbA1c levels: Lab Results  Component Value Date   HGBA1C 9.0 (A) 09/09/2021   HGBA1C 7.8 (A) 05/06/2021   HGBA1C 8.1 (H) 08/12/2020   Beth Holden is on:: - Metformin 1000 mg 2x a day - Tresiba 34 >> 44 units daily Insulin Before breakfast Before lunch Before dinner At bedtime  Regular 25(-30) - 25(-30)   - Mounjaro - PA approved - but not on it... changed insurances since last OV  Beth Holden also tried: [place] 03/2020 -could not start b/c of  $$$ Levemir 47 units in am and 37 units in pm >> $800 for 3 mo supply Januvia 100 mg daily - $450 for 3 mo Invokana 100 mg - $455 for 3 mo Took Byetta before. We could not use Trulicity as Beth Holden developed GI symptoms with it. We discussed about starting Beth Holden on a VGo mechanical pump in the past. Beth Holden had an appointment with diabetes education but decided not to pursue it. We also tried a sliding scale of regular insulin in the past but Beth Holden was not using it. We tried Trulicity but Beth Holden could not tolerate it due to nausea, diarrhea, constipation, weakness. Beth Holden was previously on NPH, but changed to [place] in 04/2021.  Beth Holden checks Beth Holden sugars 2-4 times a day per review of Beth Holden log: - am:  98, 129-230, 252 >> 122, 150-289 >> 165-315 >> 99-205, 223 - 2h after b'fast : 146-314 >> 190-295 >> 237-278 >> 218-334 >> 100-168 - prelunch:    214-314  >> 158-207 >> 107-354 >> 212-364 >> 135-259 - 2-3h after lunch:   130-204, 242 >>  121-337 >> 192-335, 402 >> 177-309 - before dinner:  72, 128-195, 236 >> 58, 153-217 >> 156-345 >> 165-314 - after dinner:  111-222, 265  >> 168-337 >> 203-215-365 >> 126, 223-323 - bedtime: 6127-285 >> 119-222, 302 >> 94-373 >> 124-297 >> 171-281, 380 - nighttime: 60, 79, 89-246 >> 52, 68-208, 330 >> 53, 75, 100-245, 373 Lowest: 40s x3 (did not eat supper) >> ... 52 >> 63 >> 53.  Has hypoglycemia awareness in the 70s. Highest: 388 >> 402 >> 372.  Meals: - Breakfast: egg, bacon, toast; grits; biscuit; fruit; sometimes skips >> 2 slices of toast + butter - Lunch: 1/2 PB sandwich or soup and yoghurt, sometimes crackers - Dinner: meat + vegetables + some starch - Snacks: fruit   + Mild CKD: BUN  Date Value Ref Range Status  11/05/2021 23 6 - 23 mg/dL Final  11/05/2021 23 6 - 23 mg/dL Final   Creatinine, Ser  Date Value Ref Range Status  11/05/2021 0.98 0.40 - 1.20 mg/dL Final  11/05/2021 0.98 0.40 - 1.20 mg/dL Final  On irbesartan.  -+ HL; latest lipid panel: Lab Results  Component Value Date   CHOL 122 11/05/2021   HDL 39.90 11/05/2021   LDLCALC 50 11/05/2021   TRIG 159.0 (H) 11/05/2021   CHOLHDL 3 11/05/2021  On rosuvastatin 20. Beth Holden has fatty liver >> was  advised to lose weight.  - last eye exam: 09/2021: No DR. Prev. Mild NPDR OU, without macular edema (Dr. Zadie Rhine).  -+ Numbness and tingling in feet-previously on amitriptyline, now on Neurontin.  Last foot exam 09/02/2021.  Derm: Dr. Jarome Matin.   Review of Systems  + see HPI  I reviewed pt's medications, allergies, PMH, social hx, family hx, and changes were documented in the history of present illness. Otherwise, unchanged from my initial visit note.  Past Medical History:  Diagnosis Date   Alopecia    Anemia, mild    Arthritis    Chronic cough    sees pulmonologist   Chronic pain    chest wall and abd - s/p extensive eval   Diabetes mellitus with neuropathy (HCC)    sees endocrine   Diverticulosis     Dyspnea    Fatty liver    GERD (gastroesophageal reflux disease)    takes Nexium bid, hx erosive esophagitis   Headache(784.0)    occasionally;r/t sinus    History of colon polyps    HTN (hypertension)    Hx of amputation of lesser toe (HCC)    sees podiatrist   Hyperlipemia    IBS (irritable bowel syndrome)    Insomnia    takes Elavil nightly   Joint pain    Neuropathy    Neuropathy    Osteomyelitis ([place])    Pneumonia    89/2/22- Beth Holden denies   PONV (postoperative nausea and vomiting)    medication did not help with surgery on 03/28/20   Seasonal allergies    takes Zyrtec daily   Sinus tachycardia    Past Surgical History:  Procedure Laterality Date   AMPUTATION  12/28/2011   Procedure: AMPUTATION DIGIT;  Surgeon: Newt Minion, MD;  Location: [place];  Service: Orthopedics;  Laterality: Right;  Right Foot 2nd Toe Amputation at MTP (metatarsophalangeal joint)   AMPUTATION Right 04/25/2012   Procedure: Right Foot 3rd Toe Amputation;  Surgeon: Newt Minion, MD;  Location: [place];  Service: Orthopedics;  Laterality: Right;  Right Foot Third Toe Amputation    AMPUTATION Right 07/27/2012   Procedure: Right 4th Toe Amputation at Metatarsophalangeal;  Surgeon: Newt Minion, MD;  Location: [place];  Service: Orthopedics;  Laterality: Right;  Right 4th Toe Amputation at Metatarsophalangeal   AMPUTATION Left 07/15/2016   Procedure: Left 2nd Ray Amputation;  Surgeon: Newt Minion, MD;  Location: [place];  Service: Orthopedics;  Laterality: Left;   AMPUTATION Left 11/18/2016   Procedure: Left 3rd and 4th Ray Amputation;  Surgeon: Newt Minion, MD;  Location: [place];  Service: Orthopedics;  Laterality: Left;   AMPUTATION Right 03/29/2017   Procedure: RIGHT FOOT 3RD AND 4TH RAY AMPUTATION;  Surgeon: Newt Minion, MD;  Location: [place];  Service: Orthopedics;  Laterality: Right;   AMPUTATION Left 08/12/2020   Procedure: LEFT TRANSMETATARSAL AMPUTATION;  Surgeon: Newt Minion, MD;  Location: [place];  Service: Orthopedics;  Laterality: Left;   APPLICATION OF WOUND VAC Left 08/12/2020   Procedure: APPLICATION OF WOUND VAC;  Surgeon: Newt Minion, MD;  Location: [place];  Service: Orthopedics;  Laterality: Left;   COLONOSCOPY     LAPAROSCOPIC APPENDECTOMY  01/05/2011   Procedure: APPENDECTOMY LAPAROSCOPIC;  Surgeon: Pedro Earls, MD;  [facility];  Service: General;  Laterality: N/A;   OOPHORECTOMY  2001   ROTATOR CUFF REPAIR Right    x 2   TUBAL LIGATION  VAGINAL HYSTERECTOMY  2001   Social History   Socioeconomic History   Marital status: Married    Spouse name: Not on file   Number of children: 2   Years of education: Not on file   Highest education level: Not on file  Occupational History   Occupation: Retired   Tobacco Use   Smoking status: Never   Smokeless tobacco: Never  Vaping Use   Vaping Use: Never used  Substance and Sexual Activity   Alcohol use: Never   Drug use: Never   Sexual activity: Yes    Birth control/protection: Surgical  Other Topics Concern   Not on file  Social History Narrative   Caffeine daily    [employer], [employer] no diploma   Married '66   1 dtr- '78; 1 son '71; 2 grandchildren   Occupation: retired 04   Dad with alzheimers-had to place in [place] (summer '10)         Social Determinants of Radio broadcast assistant Strain: Not on Art therapist Insecurity: Not on file  Transportation Needs: Not on file  Physical Activity: Not on file  Stress: Not on file  Social Connections: Not on file  Intimate Partner Violence: Not on file   Current Outpatient Medications on File Prior to Visit  Medication Sig Dispense Refill   acetaminophen (TYLENOL) 500 MG tablet Take 500 mg by mouth every 8 (eight) hours as needed for mild pain.      azelastine (ASTELIN) 0.1 % nasal spray Place 2 sprays into both nostrils 2 (two) times daily. 30 mL 12   betamethasone dipropionate (DIPROLENE) 0.05 % cream Apply 1 application topically 2 (two) times daily as  needed (IRRITATION). 30 g 0   Blood Glucose Monitoring Suppl (ONE TOUCH ULTRA 2) w/Device KIT Use to check blood sugar 1 kit 0   Calcium Carb-Cholecalciferol (CALCIUM + VITAMIN D3) 600-10 MG-MCG TABS Take 2 tablets by mouth daily. 90 tablet 3   cetirizine (ZYRTEC) 10 MG tablet Take 1 tablet (10 mg total) by mouth daily. 30 tablet 11   fluconazole (DIFLUCAN) 150 MG tablet Take 1 tablet (150 mg total) by mouth every 3 (three) days as needed. 3 tablet 0   Fluocinolone Acetonide 0.01 % OIL Place 1-2 drops into both ears daily. As needed 20 mL 0   gabapentin (NEURONTIN) 100 MG capsule TAKE 2 TO 3 CAPSULES AT    BEDTIME 180 capsule 3   glucagon (GLUCAGEN) 1 MG SOLR injection Inject 1 mg into the muscle once as needed for up to 1 dose for low blood sugar. 1 each 11   glucose blood (ONETOUCH ULTRA) test strip USE 1 STRIP TO CHECK GLUCOSE THREE TIMES DAILY 300 each 3   insulin degludec (TRESIBA FLEXTOUCH) 200 UNIT/ML FlexTouch Pen Inject 34 Units into the skin daily. 12 mL 3   Insulin Pen Needle 32G X 4 MM MISC Use 1x a day 100 each 3   insulin regular (NOVOLIN R RELION) 100 units/mL injection Inject 0.2-0.3 mLs (20-30 Units total) into the skin 3 (three) times daily before meals. Per sliding scale     Insulin Syringe-Needle U-100 (RELION INSULIN SYRINGE 1ML/31G) 31G X 5/16" 1 ML MISC USE 2 TIMES A DAY (Beth Holden taking differently: 1 each by Other route 2 (two) times daily.) 100 each 2   irbesartan-hydrochlorothiazide (AVALIDE) 150-12.5 MG tablet Take 1 tablet by mouth daily. 90 tablet 3   metFORMIN (GLUCOPHAGE) 1000 MG tablet TAKE 1 TABLET TWICE DAILY  WITH MEALS  180 tablet 1   metoprolol succinate (TOPROL-XL) 25 MG 24 hr tablet Take 1 tablet (25 mg total) by mouth daily. 90 tablet 2   MICONAZOLE NITRATE VAGINAL (MICONAZOLE 3) 4 % CREA Use topically twice daily for vaginal irritation and in groin 25 g 0   omeprazole (PRILOSEC) 40 MG capsule TAKE 1 CAPSULE TWICE DAILY 180 capsule 3   OVER THE COUNTER  MEDICATION Apply 1-3 application topically at bedtime as needed (foot pain). TOPRICIN FOOT CREAM - NEUROPATHY FOOT CREAM **APPLIES TO BOTH FEET AT BEDTIME**     Probiotic Product (PROBIOTIC DAILY PO) Take 1 capsule by mouth daily.     rosuvastatin (CRESTOR) 20 MG tablet TAKE 1 TABLET DAILY 90 tablet 3   vitamin B-12 (CYANOCOBALAMIN) 1000 MCG tablet Take 1,000 mcg daily by mouth.     vitamin C (ASCORBIC ACID) 500 MG tablet Take 500 mg by mouth daily.     No current facility-administered medications on file prior to visit.   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   Codeine Other (See Comments)    Makes Beth Holden crazy   Propoxyphene Hcl Itching    *DARVOCET    Family History  Problem Relation Age of Onset   Lung cancer Mother 40       smoked heavily   Emphysema Mother    Hypertension Father    Hyperlipidemia Father    Diabetes Father    Coronary artery disease Father    Dementia Father    COPD Father        smoked   Stomach cancer Paternal Aunt    Brain cancer Paternal Uncle    Irritable bowel syndrome Other        Several family members on fathers side    Diabetes Other    Stomach cancer Maternal Aunt    Lung cancer Paternal Uncle    Heart disease Paternal Uncle    Colon cancer Neg Hx     Objective:   Physical Exam BP 120/68 (BP Location: Right Arm, Beth Holden Position: Sitting, Cuff Size: Normal)   Pulse 73   Ht 5' 6"$  (1.676 m)   Wt 221 lb 12.8 oz (100.6 kg)   SpO2 99%   BMI 35.80 kg/m   Wt Readings from Last 3 Encounters:  03/02/22 221 lb 12.8 oz (100.6 kg)  02/21/22 217 lb 4 oz (98.5 kg)  12/08/21 225 lb 12.8 oz (102.4 kg)   Constitutional: overweight, in NAD Eyes: EOMI, no exophthalmos ENT: no thyromegaly, no cervical lymphadenopathy Cardiovascular: RRR, No MRG Respiratory: CTA B Musculoskeletal: + deformities (several amputations of toes, Charcot deformity of the left foot) Skin: no rashes Neurological: no  tremor with outstretched hands  Assessment:     1. 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 HL  Plan:     1. DM2 - pt with longstanding, uncontrolled, type 2 diabetes, on basal/bolus insulin regimen along with metformin. We tried to start a GLP-1/GIP receptor agonist at last visit and this was covered by Beth Holden insurance, but Beth Holden is not taking this now. -At last visit, sugars were higher.  Upon  questioning, Beth Holden did not change the dose of [place] as advised or let me know about the high blood sugars.  We increased the Tresiba dose and I recommended Mounjaro.  We could not use Trulicity as Beth Holden developed GI symptoms with it.  Beth Holden does have IBS with diarrhea alternating with constipation.  Invokana was expensive in the past and Wilder Glade was also not affordable.  At last visit I also recommended a CGM.  I advised Beth Holden to find out the preferred supplier for this and let us know so we can send the prescription to them. -At today's visit, sugars appear to be improved from before, but still quite fluctuating.  They increase as the day goes by with occasional that Beth Holden blood sugars (even 1 blood sugar in the 50s) at bedtime.  Beth Holden is not sure what happened with the [facility] prescription.  Beth Holden was not aware that this was approved by Beth Holden insurance.  Therefore, at today's visit, I advised Beth Holden to check again with the pharmacy, since the PA appears to have been approved in 09/2021.  I am really hoping that we can start this for Beth Holden since Beth Holden needs better blood sugar control and also weight loss to improve Beth Holden fatty liver.  If this is not covered, we can try Ozempic.  Beth Holden  does not think that Beth Holden would qualify for Beth Holden assistance due to Beth Holden husband's income. -In the meantime, I did advise Beth Holden to use a higher dose of insulin for the first meal of Beth Holden day, since sugars are increasing in the middle of the day - I advised Beth Holden to: Beth Holden Instructions  Please continue: - Metformin 1000 mg 2x a day - Tresiba 44 units daily  Insulin Before breakfast Before lunch Before dinner At bedtime  Regular 25-33 - 25-30     Please retry: - Mounjaro 2.5 mg weekly and increase to 5 mg weekly after 1 month - let me know if I need to send the new prescription  Please return in 4 months with your sugar log  - we checked Beth Holden HbA1c: 8.5%- lower  - advised to check sugars at different times of the day - 4x a day, rotating check times - advised for yearly eye exams >> Beth Holden is UTD - return to clinic in 4 months  2. PN -Related to diabetes -Continues to have numbness and tingling, mostly in Beth Holden left big toe -Previously on amitriptyline, now on Neurontin 2 capsules at night.  I usually refill this for Beth Holden.  3. HL -Reviewed the latest lipid panel from 4 months ago: Fractions at goal: Lab Results  Component Value Date   CHOL 122 11/05/2021   HDL 39.90 11/05/2021   LDLCALC 50 11/05/2021   TRIG 159.0 (H) 11/05/2021   CHOLHDL 3 11/05/2021  -on Crestor 10 mg daily w/o SEs  Philemon Kingdom, MD PhD [facility] Endocrinology

## 2022-03-04 ENCOUNTER — Encounter: Payer: Self-pay | Admitting: Internal Medicine

## 2022-03-15 ENCOUNTER — Encounter: Payer: Self-pay | Admitting: Internal Medicine

## 2022-03-16 ENCOUNTER — Other Ambulatory Visit: Payer: Self-pay | Admitting: Internal Medicine

## 2022-03-16 DIAGNOSIS — E1165 Type 2 diabetes mellitus with hyperglycemia: Secondary | ICD-10-CM

## 2022-03-16 MED ORDER — ONETOUCH ULTRA VI STRP: ORAL_STRIP | 3 refills | Status: DC

## 2022-03-22 ENCOUNTER — Ambulatory Visit (INDEPENDENT_AMBULATORY_CARE_PROVIDER_SITE_OTHER): Payer: Medicare Other | Admitting: Family

## 2022-03-22 ENCOUNTER — Encounter: Payer: Self-pay | Admitting: Family

## 2022-03-22 VITALS — BP 120/72 | HR 81 | Temp 97.5°F | Ht 66.0 in | Wt 221.8 lb

## 2022-03-22 DIAGNOSIS — B369 Superficial mycosis, unspecified: Secondary | ICD-10-CM

## 2022-03-22 MED ORDER — CLOTRIMAZOLE 1 % EX CREA: 1.0000 | TOPICAL_CREAM | Freq: Two times a day (BID) | CUTANEOUS | 2 refills | Status: AC

## 2022-03-22 NOTE — Progress Notes (Signed)
Patient ID: Beth Holden, female    DOB: 1945-12-24, 77 y.o.   MRN: [id number]  Chief Complaint  Patient presents with   Rash    Pt c/o Rash under breast and under stomach. Redness, itching and dryness. Has tried monistat cream which does not help much. Present for weeks.     HPI:      Skin rash:   Pt c/o Rash under breast and under stomach and along her groin. Redness, itching and dryness. Has tried monistat cream which does not help much. Has a miconazole powder given to her by a friend that she was unsure if ok to use. Has had the rash for weeks.       Assessment & Plan:  1. Fungal skin infection - sending Clotrimazole to use instead of miconazole cream. Advised on use & SE, advised ok to use the miconazole powder over the cream after washing & drying all areas well, apply thin layer of cream & powder. Advised pt on keeping all areas as dry as possible, discussed causes of yeast on skin. pt states she is starting mounjaro to help lower BS & lose weight.  - clotrimazole (CLOTRIMAZOLE AF) 1 % cream; Apply 1 Application topically 2 (two) times daily.  Dispense: 35.4 g; Refill: 2   Subjective:    Outpatient Medications Prior to Visit  Medication Sig Dispense Refill   acetaminophen (TYLENOL) 500 MG tablet Take 500 mg by mouth every 8 (eight) hours as needed for mild pain.      azelastine (ASTELIN) 0.1 % nasal spray Place 2 sprays into both nostrils 2 (two) times daily. 30 mL 12   betamethasone dipropionate (DIPROLENE) 0.05 % cream Apply 1 application topically 2 (two) times daily as needed (IRRITATION). 30 g 0   Blood Glucose Monitoring Suppl (ONE TOUCH ULTRA 2) w/Device KIT Use to check blood sugar 1 kit 0   Calcium Carb-Cholecalciferol (CALCIUM + VITAMIN D3) 600-10 MG-MCG TABS Take 2 tablets by mouth daily. 90 tablet 3   cetirizine (ZYRTEC) 10 MG tablet Take 1 tablet (10 mg total) by mouth daily. 30 tablet 11   fluconazole (DIFLUCAN) 150 MG tablet Take 1 tablet (150 mg total) by  mouth every 3 (three) days as needed. 3 tablet 0   Fluocinolone Acetonide 0.01 % OIL Place 1-2 drops into both ears daily. As needed 20 mL 0   gabapentin (NEURONTIN) 100 MG capsule TAKE 2 TO 3 CAPSULES AT    BEDTIME 180 capsule 3   glucagon (GLUCAGEN) 1 MG SOLR injection Inject 1 mg into the muscle once as needed for up to 1 dose for low blood sugar. 1 each 11   glucose blood (ONETOUCH ULTRA) test strip USE 1 STRIP TO CHECK GLUCOSE 3-4 TIMES DAILY 400 each 3   insulin degludec (TRESIBA FLEXTOUCH) 200 UNIT/ML FlexTouch Pen Inject 34 Units into the skin daily. 12 mL 3   Insulin Pen Needle 32G X 4 MM MISC Use 1x a day 100 each 3   insulin regular (NOVOLIN R RELION) 100 units/mL injection Inject 0.2-0.3 mLs (20-30 Units total) into the skin 3 (three) times daily before meals. Per sliding scale     Insulin Syringe-Needle U-100 (RELION INSULIN SYRINGE 1ML/31G) 31G X 5/16" 1 ML MISC USE 2 TIMES A DAY (Patient taking differently: 1 each by Other route 2 (two) times daily.) 100 each 2   irbesartan-hydrochlorothiazide (AVALIDE) 150-12.5 MG tablet Take 1 tablet by mouth daily. 90 tablet 3   metFORMIN (GLUCOPHAGE) 1000 MG  tablet TAKE 1 TABLET TWICE DAILY  WITH MEALS 180 tablet 1   metoprolol succinate (TOPROL-XL) 25 MG 24 hr tablet Take 1 tablet (25 mg total) by mouth daily. 90 tablet 2   MICONAZOLE NITRATE VAGINAL (MICONAZOLE 3) 4 % CREA Use topically twice daily for vaginal irritation and in groin 25 g 0   omeprazole (PRILOSEC) 40 MG capsule TAKE 1 CAPSULE TWICE DAILY 180 capsule 3   OVER THE COUNTER MEDICATION Apply 1-3 application topically at bedtime as needed (foot pain). TOPRICIN FOOT CREAM - NEUROPATHY FOOT CREAM **APPLIES TO BOTH FEET AT BEDTIME**     Probiotic Product (PROBIOTIC DAILY PO) Take 1 capsule by mouth daily.     rosuvastatin (CRESTOR) 20 MG tablet TAKE 1 TABLET DAILY 90 tablet 3   tirzepatide (MOUNJARO) 2.5 MG/0.5ML Pen Inject 2.5 mg into the skin once a week. 2 mL 1   vitamin B-12  (CYANOCOBALAMIN) 1000 MCG tablet Take 1,000 mcg daily by mouth.     vitamin C (ASCORBIC ACID) 500 MG tablet Take 500 mg by mouth daily.     No facility-administered medications prior to visit.   Past Medical History:  Diagnosis Date   Alopecia    Anemia, mild    Arthritis    Chronic cough    sees pulmonologist   Chronic pain    chest wall and abd - s/p extensive eval   Diabetes mellitus with neuropathy (HCC)    sees endocrine   Diverticulosis    Dyspnea    Fatty liver    GERD (gastroesophageal reflux disease)    takes Nexium bid, hx erosive esophagitis   Headache(784.0)    occasionally;r/t sinus    History of colon polyps    HTN (hypertension)    Hx of amputation of lesser toe (HCC)    sees podiatrist   Hyperlipemia    IBS (irritable bowel syndrome)    Insomnia    takes Elavil nightly   Joint pain    Neuropathy    Neuropathy    Osteomyelitis ([place])    Pneumonia    89/2/22- patient denies   PONV (postoperative nausea and vomiting)    medication did not help with surgery on 03/28/20   Seasonal allergies    takes Zyrtec daily   Sinus tachycardia    Past Surgical History:  Procedure Laterality Date   AMPUTATION  12/28/2011   Procedure: AMPUTATION DIGIT;  Surgeon: Newt Minion, MD;  Location: [place];  Service: Orthopedics;  Laterality: Right;  Right Foot 2nd Toe Amputation at MTP (metatarsophalangeal joint)   AMPUTATION Right 04/25/2012   Procedure: Right Foot 3rd Toe Amputation;  Surgeon: Newt Minion, MD;  Location: [place];  Service: Orthopedics;  Laterality: Right;  Right Foot Third Toe Amputation    AMPUTATION Right 07/27/2012   Procedure: Right 4th Toe Amputation at Metatarsophalangeal;  Surgeon: Newt Minion, MD;  Location: [place];  Service: Orthopedics;  Laterality: Right;  Right 4th Toe Amputation at Metatarsophalangeal   AMPUTATION Left 07/15/2016   Procedure: Left 2nd Ray Amputation;  Surgeon: Newt Minion, MD;  Location: [place];  Service: Orthopedics;  Laterality:  Left;   AMPUTATION Left 11/18/2016   Procedure: Left 3rd and 4th Ray Amputation;  Surgeon: Newt Minion, MD;  Location: [place];  Service: Orthopedics;  Laterality: Left;   AMPUTATION Right 03/29/2017   Procedure: RIGHT FOOT 3RD AND 4TH RAY AMPUTATION;  Surgeon: Newt Minion, MD;  Location: [place];  Service: Orthopedics;  Laterality:  Right;   AMPUTATION Left 08/12/2020   Procedure: LEFT TRANSMETATARSAL AMPUTATION;  Surgeon: Newt Minion, MD;  Location: [place];  Service: Orthopedics;  Laterality: Left;   APPLICATION OF WOUND VAC Left 08/12/2020   Procedure: APPLICATION OF WOUND VAC;  Surgeon: Newt Minion, MD;  Location: [place];  Service: Orthopedics;  Laterality: Left;   COLONOSCOPY     LAPAROSCOPIC APPENDECTOMY  01/05/2011   Procedure: APPENDECTOMY LAPAROSCOPIC;  Surgeon: Pedro Earls, MD;  Location[facility];  Service: General;  Laterality: N/A;   OOPHORECTOMY  2001   ROTATOR CUFF REPAIR Right    x 2   TUBAL LIGATION     VAGINAL HYSTERECTOMY  2001   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  Constitutional:      Appearance: Normal appearance.  Cardiovascular:     Rate and Rhythm: Normal rate and regular rhythm.  Pulmonary:     Effort: Pulmonary effort is normal.     Breath sounds: Normal breath sounds.  Musculoskeletal:        General: Normal range of motion.  Skin:    General: Skin is warm and dry.     Findings: No rash (mild erythema noted under bilateral breasts, more erythema on bilateral groins).  Neurological:     Mental Status: She is alert.  Psychiatric:        Mood and Affect: Mood normal.        Behavior: Behavior normal.    BP 120/72 (BP Location: Left Arm, Patient Position: Sitting, Cuff Size: Normal)   Pulse 81   Temp (!) 97.5 F (36.4 C)  (Temporal)   Ht '5\' 6"'$  (1.676 m)   Wt 221 lb 12.8 oz (100.6 kg)   SpO2 95%   BMI 35.80 kg/m  Wt Readings from Last 3 Encounters:  03/22/22 221 lb 12.8 oz (100.6 kg)  03/02/22 221 lb 12.8 oz (100.6 kg)  02/21/22 217 lb 4 oz (98.5 kg)       Jeanie Sewer, NP

## 2022-03-22 NOTE — Patient Instructions (Signed)
It was very nice to see you today!   Wash all areas with soap & water and dry the skin completely. Then apply the Clotrimazole antifungal cream I sent to your pharmacy instead of the [place] cream you have.  Ask the pharmacist to show you where it is on the shelf if it is not covered by your insurance. Cover the cream with the powder.  Once all areas are healed, you can continue to sprinkle a small amount of the powder directly on the skin to help prevent a rash, especially as the weather turns more humid.      PLEASE NOTE:  If you had any lab tests please let us know if you have not heard back within a few days. You may see your results on MyChart before we have a chance to review them but we will give you a call once they are reviewed by [place]. If we ordered any referrals today, please let us know if you have not heard from their office within the next week.

## 2022-03-24 ENCOUNTER — Ambulatory Visit (INDEPENDENT_AMBULATORY_CARE_PROVIDER_SITE_OTHER): Payer: Medicare Other | Admitting: Orthopedic Surgery

## 2022-03-24 ENCOUNTER — Encounter: Payer: Self-pay | Admitting: Orthopedic Surgery

## 2022-03-24 DIAGNOSIS — L97511 Non-pressure chronic ulcer of other part of right foot limited to breakdown of skin: Secondary | ICD-10-CM | POA: Diagnosis not present

## 2022-03-24 DIAGNOSIS — L97521 Non-pressure chronic ulcer of other part of left foot limited to breakdown of skin: Secondary | ICD-10-CM

## 2022-03-24 DIAGNOSIS — Z89432 Acquired absence of left foot: Secondary | ICD-10-CM

## 2022-03-24 NOTE — Progress Notes (Signed)
Office Visit Note   [name]           Date of Birth: Jul 06, 1945           MRN: [id number] Visit Date: 03/24/2022              Requested by: Vivi Barrack, MD [address] [place],  [place] [address] PCP: Vivi Barrack, MD  Chief Complaint  Patient presents with   Right Foot - Wound Check   Left Foot - Follow-up    Hx transmet amputation      HPI: Patient is a 77 year old woman who presents in follow-up status post left transmetatarsal amputation with persistent ulceration plantar aspect of the right foot.  Assessment & Plan: Visit Diagnoses:  1. Non-pressure chronic ulcer of other part of right foot limited to breakdown of skin ([place])   2. 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[place])   3. Ulcer of left foot, limited to breakdown of skin ([place])     Plan: Callus was pared on the left foot ulcer debrided on the right foot.  Follow-Up Instructions: Return in about 4 weeks (around 04/21/2022).   Ortho Exam  Patient is alert, oriented, no adenopathy, well-dressed, normal affect, normal respiratory effort. Examination patient has hypertrophic callus over the left transmetatarsal amputation.  After informed consent a 10 blade knife was used to pare the callus there is no deep abscess no signs of infection.  Examination the right foot patient has a plantar Wagner grade 1 ulcer beneath the metatarsal heads.  After informed consent a 10 blade knife was used to debride the skin and soft tissue back to healthy viable tissue.  Silver nitrate was used for hemostasis the ulcer was 2 cm in diameter after debridement.  Imaging: No results found. No images are attached to the encounter.  Labs: Lab Results  Component Value Date   HGBA1C 8.4 (A) 03/02/2022   HGBA1C 9.0 (A) 09/09/2021   HGBA1C 7.8 (A) 05/06/2021   ESRSEDRATE 51 (H) 12/24/2019   ESRSEDRATE 36 (H) 02/24/2017   CRP 13.2 (H) 12/24/2019   CRP 1.2 (H) 02/24/2017   LABURIC 5.5 09/01/2015    REPTSTATUS 06/03/2020 FINAL 05/29/2020   CULT  05/29/2020    NO GROWTH 5 DAYS Performed at [facility], [place] [address], [place], [place] [address]    LABORGA GROUP B STREP (S.AGALACTIAE) ISOLATED 12/26/2014     Lab Results  Component Value Date   ALBUMIN 4.1 11/05/2021   ALBUMIN 4.1 11/05/2021   ALBUMIN 3.8 09/08/2020   PREALBUMIN 19.4 03/05/2012    No results found for: "MG" No results found for: "VD25OH"  Lab Results  Component Value Date   PREALBUMIN 19.4 03/05/2012      Latest Ref Rng & Units 12/08/2021    7:59 AM 11/05/2021    9:13 AM 10/06/2021    8:10 AM  CBC EXTENDED  WBC 4.0 - 10.5 K/uL 13.1  11.7  9.9   RBC 3.87 - 5.11 MIL/uL 4.21  4.13  4.14   Hemoglobin 12.0 - 15.0 g/dL 12.4  11.8  12.0   HCT 36.0 - 46.0 % 37.6  35.9  36.7   Platelets 150 - 400 K/uL 368  364.0  403   NEUT# 1.7 - 7.7 K/uL 8.6   6.4   Lymph# 0.7 - 4.0 K/uL 3.4   2.4      There is no height or weight on file to calculate BMI.  Orders:  No orders of the defined types were placed in this encounter.  No orders of the defined types were placed in this encounter.    Procedures: No procedures performed  Clinical Data: No additional findings.  ROS:  All other systems negative, except as noted in the HPI. Review of Systems  Objective: Vital Signs: There were no vitals taken for this visit.  Specialty Comments:  No specialty comments available.  PMFS History: Patient Active Problem List   Diagnosis Date Noted   Osteopenia Last DEXA 11/23 11/18/2021   CKD stage 3 due to type 2 diabetes mellitus ([place]) 11/10/2021   Mild cognitive impairment 11/10/2021   Coccyx pain 12/18/2020   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[place]) 10/28/2020   Memory loss 11/21/2019   Mild nonproliferative diabetic retinopathy of both eyes ([place]) 10/23/2019   Nuclear sclerotic cataract of both eyes 10/23/2019   Posterior vitreous detachment of right eye 10/23/2019   Retinal hemorrhage  of left eye 10/23/2019   Impingement syndrome of right shoulder 07/06/2017   Iron deficiency anemia 05/11/2017   Morbid obesity ([place]) 04/24/2017   Anemia 02/24/2017   Baker's cyst 07/10/2016   Onychomycosis 12/07/2015   Upper airway cough syndrome 03/05/2014   Hypertension associated with diabetes ([place]) 12/05/2006   T2DM (type 2 diabetes mellitus) ([place]) 10/12/2006   Dyslipidemia associated with type 2 diabetes mellitus ([place]) 10/12/2006   Allergic rhinitis 10/12/2006   GERD - Followed by Dr. Fuller Plan 10/12/2006   IRRITABLE BOWEL SYNDROME - followed by Dr. Fuller Plan 10/12/2006   Peripheral neuropathy 10/11/2006   ALOPECIA NEC 10/11/2006   Past Medical History:  Diagnosis Date   Alopecia    Anemia, mild    Arthritis    Chronic cough    sees pulmonologist   Chronic pain    chest wall and abd - s/p extensive eval   Diabetes mellitus with neuropathy (HCC)    sees endocrine   Diverticulosis    Dyspnea    Fatty liver    GERD (gastroesophageal reflux disease)    takes Nexium bid, hx erosive esophagitis   Headache(784.0)    occasionally;r/t sinus    History of colon polyps    HTN (hypertension)    Hx of amputation of lesser toe (HCC)    sees podiatrist   Hyperlipemia    IBS (irritable bowel syndrome)    Insomnia    takes Elavil nightly   Joint pain    Neuropathy    Neuropathy    Osteomyelitis ([place])    Pneumonia    89/2/22- patient denies   PONV (postoperative nausea and vomiting)    medication did not help with surgery on 03/28/20   Seasonal allergies    takes Zyrtec daily   Sinus tachycardia     Family History  Problem Relation Age of Onset   Lung cancer Mother 51       smoked heavily   Emphysema Mother    Hypertension Father    Hyperlipidemia Father    Diabetes Father    Coronary artery disease Father    Dementia Father    COPD Father        smoked   Stomach cancer Paternal Aunt    Brain cancer Paternal Uncle    Irritable bowel syndrome Other        Several  family members on fathers side    Diabetes Other    Stomach cancer Maternal Aunt    Lung cancer Paternal Uncle    Heart disease Paternal  Uncle    Colon cancer Neg Hx     Past Surgical History:  Procedure Laterality Date   AMPUTATION  12/28/2011   Procedure: AMPUTATION DIGIT;  Surgeon: Newt Minion, MD;  Location: [place];  Service: Orthopedics;  Laterality: Right;  Right Foot 2nd Toe Amputation at MTP (metatarsophalangeal joint)   AMPUTATION Right 04/25/2012   Procedure: Right Foot 3rd Toe Amputation;  Surgeon: Newt Minion, MD;  Location: [place];  Service: Orthopedics;  Laterality: Right;  Right Foot Third Toe Amputation    AMPUTATION Right 07/27/2012   Procedure: Right 4th Toe Amputation at Metatarsophalangeal;  Surgeon: Newt Minion, MD;  Location: [place];  Service: Orthopedics;  Laterality: Right;  Right 4th Toe Amputation at Metatarsophalangeal   AMPUTATION Left 07/15/2016   Procedure: Left 2nd Ray Amputation;  Surgeon: Newt Minion, MD;  Location: [place];  Service: Orthopedics;  Laterality: Left;   AMPUTATION Left 11/18/2016   Procedure: Left 3rd and 4th Ray Amputation;  Surgeon: Newt Minion, MD;  Location: [facility];  Service: Orthopedics;  Laterality: Left;   AMPUTATION Right 03/29/2017   Procedure: RIGHT FOOT 3RD AND 4TH RAY AMPUTATION;  Surgeon: Newt Minion, MD;  Location: [place];  Service: Orthopedics;  Laterality: Right;   AMPUTATION Left 08/12/2020   Procedure: LEFT TRANSMETATARSAL AMPUTATION;  Surgeon: Newt Minion, MD;  Location: [place];  Service: Orthopedics;  Laterality: Left;   APPLICATION OF WOUND VAC Left 08/12/2020   Procedure: APPLICATION OF WOUND VAC;  Surgeon: Newt Minion, MD;  Location: [place];  Service: Orthopedics;  Laterality: Left;   COLONOSCOPY     LAPAROSCOPIC APPENDECTOMY  01/05/2011   Procedure: APPENDECTOMY LAPAROSCOPIC;  Surgeon: Pedro Earls, MD;  Location: [facility];  Service: General;  Laterality: N/A;   OOPHORECTOMY  2001   ROTATOR CUFF REPAIR Right     x 2   TUBAL LIGATION     VAGINAL HYSTERECTOMY  2001   Social History   Occupational History   Occupation: Retired   Tobacco Use   Smoking status: Never   Smokeless tobacco: Never  Vaping Use   Vaping Use: Never used  Substance and Sexual Activity   Alcohol use: Never   Drug use: Never   Sexual activity: Yes    Birth control/protection: Surgical

## 2022-04-06 ENCOUNTER — Other Ambulatory Visit: Payer: Self-pay | Admitting: Internal Medicine

## 2022-04-06 MED ORDER — TIRZEPATIDE 5 MG/0.5ML ~~LOC~~ SOAJ: 5.0000 mg | SUBCUTANEOUS | 3 refills | Status: DC

## 2022-04-20 ENCOUNTER — Other Ambulatory Visit: Payer: Self-pay | Admitting: Internal Medicine

## 2022-04-21 ENCOUNTER — Ambulatory Visit (INDEPENDENT_AMBULATORY_CARE_PROVIDER_SITE_OTHER): Payer: Medicare Other | Admitting: Orthopedic Surgery

## 2022-04-21 DIAGNOSIS — L97511 Non-pressure chronic ulcer of other part of right foot limited to breakdown of skin: Secondary | ICD-10-CM

## 2022-04-21 DIAGNOSIS — L97521 Non-pressure chronic ulcer of other part of left foot limited to breakdown of skin: Secondary | ICD-10-CM | POA: Diagnosis not present

## 2022-04-21 MED ORDER — SILVER SULFADIAZINE 1 % EX CREA: 1.0000 | TOPICAL_CREAM | Freq: Every day | CUTANEOUS | 0 refills | Status: AC

## 2022-05-03 ENCOUNTER — Encounter: Payer: Self-pay | Admitting: Orthopedic Surgery

## 2022-05-03 NOTE — Progress Notes (Signed)
Office Visit Note   [name]           Date of Birth: Nov 16, 1945           MRN: [id number] Visit Date: 04/21/2022              Requested by: Ardith Dark, MD [address] [place],  [place] [address] PCP: Ardith Dark, MD  Chief Complaint  Patient presents with   Right Foot - Follow-up   Left Foot - Follow-up    Hx transmet amputation      HPI: Patient is a 77 year old woman who is seen in follow-up for ulcerations, Wagner grade 1, bilateral lower extremities.  Patient states she is on new diabetic medication.  Patient states that her neuropathy has changed from a being a burning feeling to feeling bone pain.  Assessment & Plan: Visit Diagnoses:  1. Non-pressure chronic ulcer of other part of right foot limited to breakdown of skin   2. Ulcer of left foot, limited to breakdown of skin     Plan: Continue with protective weightbearing prescription sent to [facility] for Silvadene.  Follow-Up Instructions: Return in about 4 weeks (around 05/19/2022).   Ortho Exam  Patient is alert, oriented, no adenopathy, well-dressed, normal affect, normal respiratory effort. Examination patient has a Wagner grade 1 ulcer beneath the right second metatarsal head and callus beneath the left transmetatarsal amputation.  After informed consent the callus was pared on the left foot no open wounds no cellulitis no drainage.  The right Wagner grade 1 ulcer was debrided of skin and soft tissue with a 10 blade knife the ulcer is 1 cm diameter 1 mm deep after debridement there is no exposed bone or tendon no signs of infection.  Imaging: No results found. No images are attached to the encounter.  Labs: Lab Results  Component Value Date   HGBA1C 8.4 (A) 03/02/2022   HGBA1C 9.0 (A) 09/09/2021   HGBA1C 7.8 (A) 05/06/2021   ESRSEDRATE 51 (H) 12/24/2019   ESRSEDRATE 36 (H) 02/24/2017   CRP 13.2 (H) 12/24/2019   CRP 1.2 (H) 02/24/2017   LABURIC 5.5 09/01/2015   REPTSTATUS  06/03/2020 FINAL 05/29/2020   CULT  05/29/2020    NO GROWTH 5 DAYS Performed at [facility], [address], [place], [place] [address]    LABORGA GROUP B STREP (S.AGALACTIAE) ISOLATED 12/26/2014     Lab Results  Component Value Date   ALBUMIN 4.1 11/05/2021   ALBUMIN 4.1 11/05/2021   ALBUMIN 3.8 09/08/2020   PREALBUMIN 19.4 03/05/2012    No results found for: "MG" No results found for: "VD25OH"  Lab Results  Component Value Date   PREALBUMIN 19.4 03/05/2012      Latest Ref Rng & Units 12/08/2021    7:59 AM 11/05/2021    9:13 AM 10/06/2021    8:10 AM  CBC EXTENDED  WBC 4.0 - 10.5 K/uL 13.1  11.7  9.9   RBC 3.87 - 5.11 MIL/uL 4.21  4.13  4.14   Hemoglobin 12.0 - 15.0 g/dL 82.9  56.2  13.0   HCT 36.0 - 46.0 % 37.6  35.9  36.7   Platelets 150 - 400 K/uL 368  364.0  403   NEUT# 1.7 - 7.7 K/uL 8.6   6.4   Lymph# 0.7 - 4.0 K/uL 3.4   2.4      There is no height or weight on file to calculate BMI.  Orders:  No orders  of the defined types were placed in this encounter.  Meds ordered this encounter  Medications   silver sulfADIAZINE (SILVADENE) 1 % cream    Sig: Apply 1 Application topically daily.    Dispense:  50 g    Refill:  0     Procedures: No procedures performed  Clinical Data: No additional findings.  ROS:  All other systems negative, except as noted in the HPI. Review of Systems  Objective: Vital Signs: There were no vitals taken for this visit.  Specialty Comments:  No specialty comments available.  PMFS History: Patient Active Problem List   Diagnosis Date Noted   Osteopenia Last DEXA 11/23 11/18/2021   CKD stage 3 due to type 2 diabetes mellitus 11/10/2021   Mild cognitive impairment 11/10/2021   Coccyx pain 12/18/2020   History of transmetatarsal amputation of left foot 10/28/2020   Memory loss 11/21/2019   Mild nonproliferative diabetic retinopathy of both eyes 10/23/2019   Nuclear sclerotic cataract of both eyes 10/23/2019    Posterior vitreous detachment of right eye 10/23/2019   Retinal hemorrhage of left eye 10/23/2019   Impingement syndrome of right shoulder 07/06/2017   Iron deficiency anemia 05/11/2017   Morbid obesity 04/24/2017   Anemia 02/24/2017   Baker's cyst 07/10/2016   Onychomycosis 12/07/2015   Upper airway cough syndrome 03/05/2014   Hypertension associated with diabetes (HCC) 12/05/2006   T2DM (type 2 diabetes mellitus) 10/12/2006   Dyslipidemia associated with type 2 diabetes mellitus 10/12/2006   Allergic rhinitis 10/12/2006   GERD - Followed by Dr. Russella Dar 10/12/2006   IRRITABLE BOWEL SYNDROME - followed by Dr. Russella Dar 10/12/2006   Peripheral neuropathy 10/11/2006   ALOPECIA NEC 10/11/2006   Past Medical History:  Diagnosis Date   Alopecia    Anemia, mild    Arthritis    Chronic cough    sees pulmonologist   Chronic pain    chest wall and abd - s/p extensive eval   Diabetes mellitus with neuropathy    sees endocrine   Diverticulosis    Dyspnea    Fatty liver    GERD (gastroesophageal reflux disease)    takes Nexium bid, hx erosive esophagitis   Headache(784.0)    occasionally;r/t sinus    History of colon polyps    HTN (hypertension)    Hx of amputation of lesser toe    sees podiatrist   Hyperlipemia    IBS (irritable bowel syndrome)    Insomnia    takes Elavil nightly   Joint pain    Neuropathy    Neuropathy    Osteomyelitis    Pneumonia    89/2/22- patient denies   PONV (postoperative nausea and vomiting)    medication did not help with surgery on 03/28/20   Seasonal allergies    takes Zyrtec daily   Sinus tachycardia     Family History  Problem Relation Age of Onset   Lung cancer Mother 71       smoked heavily   Emphysema Mother    Hypertension Father    Hyperlipidemia Father    Diabetes Father    Coronary artery disease Father    Dementia Father    COPD Father        smoked   Stomach cancer Paternal Aunt    Brain cancer Paternal Uncle    Irritable  bowel syndrome Other        Several family members on fathers side    Diabetes Other    Stomach cancer  Maternal Aunt    Lung cancer Paternal Uncle    Heart disease Paternal Uncle    Colon cancer Neg Hx     Past Surgical History:  Procedure Laterality Date   AMPUTATION  12/28/2011   Procedure: AMPUTATION DIGIT;  Surgeon: Nadara Mustard, MD;  [facility];  Service: Orthopedics;  Laterality: Right;  Right Foot 2nd Toe Amputation at MTP (metatarsophalangeal joint)   AMPUTATION Right 04/25/2012   Procedure: Right Foot 3rd Toe Amputation;  Surgeon: Nadara Mustard, MD;  [facility];  Service: Orthopedics;  Laterality: Right;  Right Foot Third Toe Amputation    AMPUTATION Right 07/27/2012   Procedure: Right 4th Toe Amputation at Metatarsophalangeal;  Surgeon: Nadara Mustard, MD;  Location: [facility];  Service: Orthopedics;  Laterality: Right;  Right 4th Toe Amputation at Metatarsophalangeal   AMPUTATION Left 07/15/2016   Procedure: Left 2nd Ray Amputation;  Surgeon: Nadara Mustard, MD;  Location: [facility];  Service: Orthopedics;  Laterality: Left;   AMPUTATION Left 11/18/2016   Procedure: Left 3rd and 4th Ray Amputation;  Surgeon: Nadara Mustard, MD;  Location: [facility];  Service: Orthopedics;  Laterality: Left;   AMPUTATION Right 03/29/2017   Procedure: RIGHT FOOT 3RD AND 4TH RAY AMPUTATION;  Surgeon: Nadara Mustard, MD;  Location: [facility];  Service: Orthopedics;  Laterality: Right;   AMPUTATION Left 08/12/2020   Procedure: LEFT TRANSMETATARSAL AMPUTATION;  Surgeon: Nadara Mustard, MD;  Location: [facility];  Service: Orthopedics;  Laterality: Left;   APPLICATION OF WOUND VAC Left 08/12/2020   Procedure: APPLICATION OF WOUND VAC;  Surgeon: Nadara Mustard, MD;  Location[facility];  Service: Orthopedics;  Laterality: Left;   COLONOSCOPY     LAPAROSCOPIC APPENDECTOMY  01/05/2011   Procedure: APPENDECTOMY LAPAROSCOPIC;  Surgeon: Valarie Merino, MD;  [facility];  Service: General;  Laterality: N/A;   OOPHORECTOMY   2001   ROTATOR CUFF REPAIR Right    x 2   TUBAL LIGATION     VAGINAL HYSTERECTOMY  2001   Social History   Occupational History   Occupation: Retired   Tobacco Use   Smoking status: Never   Smokeless tobacco: Never  Vaping Use   Vaping Use: Never used  Substance and Sexual Activity   Alcohol use: Never   Drug use: Never   Sexual activity: Yes    Birth control/protection: Surgical

## 2022-05-26 ENCOUNTER — Ambulatory Visit (INDEPENDENT_AMBULATORY_CARE_PROVIDER_SITE_OTHER): Payer: Medicare Other | Admitting: Orthopedic Surgery

## 2022-05-26 DIAGNOSIS — L97511 Non-pressure chronic ulcer of other part of right foot limited to breakdown of skin: Secondary | ICD-10-CM

## 2022-05-31 DIAGNOSIS — L820 Inflamed seborrheic keratosis: Secondary | ICD-10-CM | POA: Diagnosis not present

## 2022-05-31 DIAGNOSIS — D485 Neoplasm of uncertain behavior of skin: Secondary | ICD-10-CM | POA: Diagnosis not present

## 2022-06-02 ENCOUNTER — Encounter: Payer: Self-pay | Admitting: Orthopedic Surgery

## 2022-06-02 NOTE — Progress Notes (Signed)
Office Visit Note   [name]           Date of Birth: 10-Aug-1945           MRN: [id number] Visit Date: 05/26/2022              Requested by: Ardith Dark, MD [address] [place],  [place] [address] PCP: Ardith Dark, MD  Chief Complaint  Patient presents with   Right Foot - Follow-up   Left Foot - Follow-up      HPI: Patient is a 77 year old woman status post transmetatarsal amputation on the left with a right foot Wagner grade 1 ulcer.  Assessment & Plan: Visit Diagnoses:  1. Non-pressure chronic ulcer of other part of right foot limited to breakdown of skin (HCC)     Plan: Ulcer was debrided on the right continue with current wound care.  Follow-Up Instructions: Return in about 4 weeks (around 06/23/2022).   Ortho Exam  Patient is alert, oriented, no adenopathy, well-dressed, normal affect, normal respiratory effort. Examination patient has callus over the left transmetatarsal amputation this was pared with a 10 blade knife.  Examination the right foot she has a Wagner grade 1 ulcer beneath the fourth metatarsal head.  After informed consent a 10 blade knife was used to debride the skin and soft tissue back to healthy viable tissue.  The wound measures 3 cm in diameter after debridement there is no exposed bone or tendon no tunneling the wound is flat with healthy granulation tissue.  Imaging: No results found. No images are attached to the encounter.  Labs: Lab Results  Component Value Date   HGBA1C 8.4 (A) 03/02/2022   HGBA1C 9.0 (A) 09/09/2021   HGBA1C 7.8 (A) 05/06/2021   ESRSEDRATE 51 (H) 12/24/2019   ESRSEDRATE 36 (H) 02/24/2017   CRP 13.2 (H) 12/24/2019   CRP 1.2 (H) 02/24/2017   LABURIC 5.5 09/01/2015   REPTSTATUS 06/03/2020 FINAL 05/29/2020   CULT  05/29/2020    NO GROWTH 5 DAYS Performed at [facility], [address] [place], [place] [address]    LABORGA GROUP B STREP (S.AGALACTIAE) ISOLATED 12/26/2014     Lab  Results  Component Value Date   ALBUMIN 4.1 11/05/2021   ALBUMIN 4.1 11/05/2021   ALBUMIN 3.8 09/08/2020   PREALBUMIN 19.4 03/05/2012    No results found for: "MG" No results found for: "VD25OH"  Lab Results  Component Value Date   PREALBUMIN 19.4 03/05/2012      Latest Ref Rng & Units 12/08/2021    7:59 AM 11/05/2021    9:13 AM 10/06/2021    8:10 AM  CBC EXTENDED  WBC 4.0 - 10.5 K/uL 13.1  11.7  9.9   RBC 3.87 - 5.11 MIL/uL 4.21  4.13  4.14   Hemoglobin 12.0 - 15.0 g/dL 82.9  56.2  13.0   HCT 36.0 - 46.0 % 37.6  35.9  36.7   Platelets 150 - 400 K/uL 368  364.0  403   NEUT# 1.7 - 7.7 K/uL 8.6   6.4   Lymph# 0.7 - 4.0 K/uL 3.4   2.4      There is no height or weight on file to calculate BMI.  Orders:  No orders of the defined types were placed in this encounter.  No orders of the defined types were placed in this encounter.    Procedures: No procedures performed  Clinical Data: No additional findings.  ROS:  All other  systems negative, except as noted in the HPI. Review of Systems  Objective: Vital Signs: There were no vitals taken for this visit.  Specialty Comments:  No specialty comments available.  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) 10/23/2019   Nuclear sclerotic cataract of both eyes 10/23/2019   Posterior vitreous detachment of right eye 10/23/2019   Retinal hemorrhage of left eye 10/23/2019   Impingement syndrome of right shoulder 07/06/2017   Iron deficiency anemia 05/11/2017   Morbid obesity (HCC) 04/24/2017   Anemia 02/24/2017   Baker's cyst 07/10/2016   Onychomycosis 12/07/2015    Upper airway cough syndrome 03/05/2014   Hypertension associated with diabetes (HCC) 12/05/2006   T2DM (type 2 diabetes mellitus) (HCC) 10/12/2006   Dyslipidemia associated with type 2 diabetes mellitus (HCC) 10/12/2006   Allergic rhinitis 10/12/2006   GERD - Followed by Dr. Russella Dar 10/12/2006   IRRITABLE BOWEL SYNDROME - followed by Dr. Russella Dar 10/12/2006   Peripheral neuropathy 10/11/2006   ALOPECIA NEC 10/11/2006   Past Medical History:  Diagnosis Date   Alopecia    Anemia, mild    Arthritis    Chronic cough    sees pulmonologist   Chronic pain    chest wall and abd - s/p extensive eval   Diabetes mellitus with neuropathy (HCC)    sees endocrine   Diverticulosis    Dyspnea    Fatty liver    GERD (gastroesophageal reflux disease)    takes Nexium bid, hx erosive esophagitis   Headache(784.0)    occasionally;r/t sinus    History of colon polyps    HTN (hypertension)    Hx of amputation of lesser toe (HCC)    sees podiatrist   Hyperlipemia    IBS (irritable bowel syndrome)    Insomnia    takes Elavil nightly   Joint pain    Neuropathy    Neuropathy    Osteomyelitis (HCC)    Pneumonia    89/2/22- patient denies   PONV (postoperative nausea and vomiting)    medication did not help with surgery on 03/28/20   Seasonal allergies    takes Zyrtec daily   Sinus tachycardia     Family History  Problem Relation Age of Onset   Lung cancer Mother 2       smoked heavily   Emphysema Mother    Hypertension Father    Hyperlipidemia Father    Diabetes Father    Coronary artery disease Father    Dementia Father    COPD Father        smoked   Stomach cancer Paternal Aunt    Brain cancer Paternal Uncle    Irritable bowel syndrome Other        Several family members on fathers side    Diabetes Other    Stomach cancer Maternal Aunt    Lung cancer Paternal Uncle    Heart disease Paternal Uncle    Colon cancer Neg Hx     Past Surgical History:  Procedure Laterality Date    AMPUTATION  12/28/2011   Procedure: AMPUTATION DIGIT;  Surgeon: Nadara Mustard, MD;  Location: [facility] OR;  Service:  Orthopedics;  Laterality: Right;  Right Foot 2nd Toe Amputation at MTP (metatarsophalangeal joint)   AMPUTATION Right 04/25/2012   Procedure: Right Foot 3rd Toe Amputation;  Surgeon: Nadara Mustard, MD;  Location: [facility] OR;  Service: Orthopedics;  Laterality: Right;  Right Foot Third Toe Amputation    AMPUTATION Right 07/27/2012   Procedure: Right 4th Toe Amputation at Metatarsophalangeal;  Surgeon: Nadara Mustard, MD;  Location: [facility] OR;  Service: Orthopedics;  Laterality: Right;  Right 4th Toe Amputation at Metatarsophalangeal   AMPUTATION Left 07/15/2016   Procedure: Left 2nd Ray Amputation;  Surgeon: Nadara Mustard, MD;  Location: [facility] OR;  Service: Orthopedics;  Laterality: Left;   AMPUTATION Left 11/18/2016   Procedure: Left 3rd and 4th Ray Amputation;  Surgeon: Nadara Mustard, MD;  Location: [facility] OR;  Service: Orthopedics;  Laterality: Left;   AMPUTATION Right 03/29/2017   Procedure: RIGHT FOOT 3RD AND 4TH RAY AMPUTATION;  Surgeon: Nadara Mustard, MD;  Location: [facility] OR;  Service: Orthopedics;  Laterality: Right;   AMPUTATION Left 08/12/2020   Procedure: LEFT TRANSMETATARSAL AMPUTATION;  Surgeon: Nadara Mustard, MD;  Location: [facility] OR;  Service: Orthopedics;  Laterality: Left;   APPLICATION OF WOUND VAC Left 08/12/2020   Procedure: APPLICATION OF WOUND VAC;  Surgeon: Nadara Mustard, MD;  Location: [facility] OR;  Service: Orthopedics;  Laterality: Left;   COLONOSCOPY     LAPAROSCOPIC APPENDECTOMY  01/05/2011   Procedure: APPENDECTOMY LAPAROSCOPIC;  Surgeon: Valarie Merino, MD;  Location: [facility] ORS;  Service: General;  Laterality: N/A;   OOPHORECTOMY  2001   ROTATOR CUFF REPAIR Right    x 2   TUBAL LIGATION     VAGINAL HYSTERECTOMY  2001   Social History   Occupational History   Occupation: Retired   Tobacco Use   Smoking status: Never   Smokeless tobacco: Never  Vaping Use   Vaping Use: Never used   Substance and Sexual Activity   Alcohol use: Never   Drug use: Never   Sexual activity: Yes    Birth control/protection: Surgical

## 2022-06-09 ENCOUNTER — Inpatient Hospital Stay: Payer: Medicare Other | Admitting: Nurse Practitioner

## 2022-06-09 ENCOUNTER — Inpatient Hospital Stay: Payer: Medicare Other

## 2022-06-16 ENCOUNTER — Encounter: Payer: Self-pay | Admitting: Nurse Practitioner

## 2022-06-16 ENCOUNTER — Inpatient Hospital Stay (HOSPITAL_BASED_OUTPATIENT_CLINIC_OR_DEPARTMENT_OTHER): Payer: Medicare Other | Admitting: Nurse Practitioner

## 2022-06-16 ENCOUNTER — Inpatient Hospital Stay: Payer: Medicare Other | Attending: Oncology

## 2022-06-16 VITALS — BP 106/58 | HR 74 | Temp 98.2°F | Resp 18 | Ht 66.0 in | Wt 199.5 lb

## 2022-06-16 DIAGNOSIS — I10 Essential (primary) hypertension: Secondary | ICD-10-CM | POA: Diagnosis not present

## 2022-06-16 DIAGNOSIS — E119 Type 2 diabetes mellitus without complications: Secondary | ICD-10-CM | POA: Diagnosis not present

## 2022-06-16 DIAGNOSIS — D509 Iron deficiency anemia, unspecified: Secondary | ICD-10-CM

## 2022-06-16 LAB — CBC WITH DIFFERENTIAL (CANCER CENTER ONLY)
Abs Immature Granulocytes: 0.05 10*3/uL (ref 0.00–0.07)
Basophils Absolute: 0.1 10*3/uL (ref 0.0–0.1)
Basophils Relative: 1 %
Eosinophils Absolute: 0.2 10*3/uL (ref 0.0–0.5)
Eosinophils Relative: 2 %
HCT: 35.1 % — ABNORMAL LOW (ref 36.0–46.0)
Hemoglobin: 11.7 g/dL — ABNORMAL LOW (ref 12.0–15.0)
Immature Granulocytes: 0 %
Lymphocytes Relative: 24 %
Lymphs Abs: 3 10*3/uL (ref 0.7–4.0)
MCH: 28.7 pg (ref 26.0–34.0)
MCHC: 33.3 g/dL (ref 30.0–36.0)
MCV: 86.2 fL (ref 80.0–100.0)
Monocytes Absolute: 0.8 10*3/uL (ref 0.1–1.0)
Monocytes Relative: 6 %
Neutro Abs: 8.5 10*3/uL — ABNORMAL HIGH (ref 1.7–7.7)
Neutrophils Relative %: 67 %
Platelet Count: 425 10*3/uL — ABNORMAL HIGH (ref 150–400)
RBC: 4.07 MIL/uL (ref 3.87–5.11)
RDW: 13.3 % (ref 11.5–15.5)
WBC Count: 12.6 10*3/uL — ABNORMAL HIGH (ref 4.0–10.5)
nRBC: 0 % (ref 0.0–0.2)

## 2022-06-16 LAB — FERRITIN: Ferritin: 182 ng/mL (ref 11–307)

## 2022-06-16 NOTE — Progress Notes (Signed)
  [facility] OFFICE PROGRESS NOTE   Diagnosis: Iron deficiency anemia  INTERVAL HISTORY:   [name] returns as scheduled.  She resumed oral iron once a day about 3 weeks ago.  No constipation.  She is not aware of any bleeding.  She reports losing 20 pounds since beginning [place].  Objective:  Vital signs in last 24 hours:  Blood pressure (!) 106/58, pulse 74, temperature 98.2 F (36.8 C), temperature source Oral, resp. rate 18, height 5\' 6"  (1.676 m), weight 199 lb 8 oz (90.5 kg), SpO2 98 %.    Resp: Lungs clear bilaterally. Cardio: Regular rate and rhythm. GI: No hepatosplenomegaly. Vascular: No leg edema.   Lab Results:  Lab Results  Component Value Date   WBC 12.6 (H) 06/16/2022   HGB 11.7 (L) 06/16/2022   HCT 35.1 (L) 06/16/2022   MCV 86.2 06/16/2022   PLT 425 (H) 06/16/2022   NEUTROABS 8.5 (H) 06/16/2022    Imaging:  No results found.  Medications: I have reviewed the patient's current medications.  Assessment/Plan: Anemia secondary to iron deficiency, possible GI blood loss related to gastritis status post IV ferrous gluconate weekly x4 beginning 03/13/2017, single dose of IV ferrous gluconate 05/22/2017.  Hemoglobin/MCV corrected into normal range. Feraheme 03/29/2021, 04/15/2021 Upper endoscopy 02/15/2017-diffuse moderate inflammation characterized by congestion, erythema, friability and granularity in the gastric body, posterior wall of the stomach and gastric antrum.  Biopsy GASTRIC ANTRAL MUCOSA WITH NON-SPECIFIC REACTIVE GASTROPATHY AND FEW SUBEPITHELIAL CRYSTALLINE IRON DEPOSITS, CONSISTENT WITH IRON PILL GASTROPATHY. GASTRIC OXYNTIC MUCOSA WITH PARIETAL CELL HYPERPLASIA AS CAN BE SEEN IN HYPERGASTRINEMIC STATES SUCH AS PPI THERAPY. WARTHIN-STARRY STAIN IS NEGATIVE FOR HELICOBACTER PYLORI Colonoscopy 02/15/2017-6 mm polyp in the transverse colon (TUBULAR ADENOMA. NEGATIVE FOR HIGH GRADE DYSPLASIA OR MALIGNANCY) Diabetes Hypertension Mild  neutrophilia- chronic, white count dating at least to 2012 on review of epic chart Small hemoglobin on repeat urinalyses-seen by urology.  Disposition: [name] appears stable.  Hemoglobin stable overall, slightly decreased as compared to 6 months ago.  We will follow-up on the ferritin.  She will continue oral iron.  Plan for IV iron as needed.  She will return for CBC and ferritin in 3 months.  We will see her in follow-up in 6 months.    Lonna Cobb ANP/GNP-BC   06/16/2022  9:09 AM

## 2022-06-22 ENCOUNTER — Encounter: Payer: Self-pay | Admitting: Internal Medicine

## 2022-06-22 ENCOUNTER — Other Ambulatory Visit: Payer: Self-pay | Admitting: Internal Medicine

## 2022-06-23 ENCOUNTER — Ambulatory Visit (INDEPENDENT_AMBULATORY_CARE_PROVIDER_SITE_OTHER): Payer: Medicare Other | Admitting: Orthopedic Surgery

## 2022-06-23 ENCOUNTER — Other Ambulatory Visit: Payer: Self-pay | Admitting: Internal Medicine

## 2022-06-23 ENCOUNTER — Encounter: Payer: Self-pay | Admitting: Orthopedic Surgery

## 2022-06-23 DIAGNOSIS — Z89432 Acquired absence of left foot: Secondary | ICD-10-CM

## 2022-06-23 DIAGNOSIS — L97511 Non-pressure chronic ulcer of other part of right foot limited to breakdown of skin: Secondary | ICD-10-CM | POA: Diagnosis not present

## 2022-06-23 DIAGNOSIS — Z794 Long term (current) use of insulin: Secondary | ICD-10-CM

## 2022-06-23 MED ORDER — METOPROLOL SUCCINATE ER 25 MG PO TB24: 25.0000 mg | ORAL_TABLET | Freq: Every day | ORAL | 0 refills | Status: DC

## 2022-06-23 NOTE — Telephone Encounter (Signed)
Good afternoon Dr. Lucianne Muss are you willing to increase her dosing or wait until Dr. Elvera Lennox returns since she has 2 weeks left on the 5 mg?

## 2022-06-23 NOTE — Progress Notes (Signed)
Office Visit Note   [name]           Date of Birth: [birth year]           MRN: [id number] Visit Date: 06/23/2022              Requested by: Ardith Dark, MD [address] [place],  [place] [address] PCP: Ardith Dark, MD  Chief Complaint  Patient presents with   Right Foot - Follow-up   Left Foot - Follow-up      HPI: Patient is a 77 year old woman who is seen in follow-up for left transmetatarsal amputation and plantar ulcer right foot.  Patient is wearing new balance sneakers.  Assessment & Plan: Visit Diagnoses:  1. Non-pressure chronic ulcer of other part of right foot limited to breakdown of skin (HCC)   2. 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HCC)     Plan: Callus was pared on the left foot ulcer debrided on the right.  Follow-Up Instructions: Return in about 4 weeks (around 07/21/2022).   Ortho Exam  Patient is alert, oriented, no adenopathy, well-dressed, normal affect, normal respiratory effort. Examination patient has a stable left transmetatarsal amputation there is callus over the residual limb and this was pared without complication.  Examination the right foot patient has a Wagner grade 1 ulcer beneath the fourth metatarsal head.  After informed consent a 10 blade knife was used to debride the skin and soft tissue back to healthy viable bleeding tissue.  The wound measures 10 mm in diameter 1 mm deep with healthy granulation tissue.  This was touched with silver nitrate a Band-Aid was applied.  Imaging: No results found. No images are attached to the encounter.  Labs: Lab Results  Component Value Date   HGBA1C 8.4 (A) 03/02/2022   HGBA1C 9.0 (A) 09/09/2021   HGBA1C 7.8 (A) 05/06/2021   ESRSEDRATE 51 (H) 12/24/2019   ESRSEDRATE 36 (H) 02/24/2017   CRP 13.2 (H) 12/24/2019   CRP 1.2 (H) 02/24/2017   LABURIC 5.5 09/01/2015   REPTSTATUS 06/03/2020 FINAL 05/29/2020   CULT  05/29/2020    NO GROWTH 5 DAYS Performed at  [facility], [address] [place], [place] [address]    LABORGA GROUP B STREP (S.AGALACTIAE) ISOLATED 12/26/2014     Lab Results  Component Value Date   ALBUMIN 4.1 11/05/2021   ALBUMIN 4.1 11/05/2021   ALBUMIN 3.8 09/08/2020   PREALBUMIN 19.4 03/05/2012    No results found for: "MG" No results found for: "VD25OH"  Lab Results  Component Value Date   PREALBUMIN 19.4 03/05/2012      Latest Ref Rng & Units 06/16/2022    8:19 AM 12/08/2021    7:59 AM 11/05/2021    9:13 AM  CBC EXTENDED  WBC 4.0 - 10.5 K/uL 12.6  13.1  11.7   RBC 3.87 - 5.11 MIL/uL 4.07  4.21  4.13   Hemoglobin 12.0 - 15.0 g/dL 65.7  84.6  96.2   HCT 36.0 - 46.0 % 35.1  37.6  35.9   Platelets 150 - 400 K/uL 425  368  364.0   NEUT# 1.7 - 7.7 K/uL 8.5  8.6    Lymph# 0.7 - 4.0 K/uL 3.0  3.4       There is no height or weight on file to calculate BMI.  Orders:  No orders of the defined types were placed in this encounter.  No orders of the defined types were  placed in this encounter.    Procedures: No procedures performed  Clinical Data: No additional findings.  ROS:  All other systems negative, except as noted in the HPI. Review of Systems  Objective: Vital Signs: There were no vitals taken for this visit.  Specialty Comments:  No specialty comments available.  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) 10/23/2019   Nuclear sclerotic cataract of both eyes 10/23/2019   Posterior vitreous detachment of right eye 10/23/2019   Retinal hemorrhage of left eye 10/23/2019   Impingement syndrome of right shoulder 07/06/2017   Iron deficiency  anemia 05/11/2017   Morbid obesity (HCC) 04/24/2017   Anemia 02/24/2017   Baker's cyst 07/10/2016   Onychomycosis 12/07/2015   Upper airway cough syndrome 03/05/2014   Hypertension associated with diabetes (HCC) 12/05/2006   T2DM (type 2 diabetes mellitus) (HCC) 10/12/2006   Dyslipidemia associated with type 2 diabetes mellitus (HCC) 10/12/2006   Allergic rhinitis 10/12/2006   GERD - Followed by Dr. Russella Dar 10/12/2006   IRRITABLE BOWEL SYNDROME - followed by Dr. Russella Dar 10/12/2006   Peripheral neuropathy 10/11/2006   ALOPECIA NEC 10/11/2006   Past Medical History:  Diagnosis Date   Alopecia    Anemia, mild    Arthritis    Chronic cough    sees pulmonologist   Chronic pain    chest wall and abd - s/p extensive eval   Diabetes mellitus with neuropathy (HCC)    sees endocrine   Diverticulosis    Dyspnea    Fatty liver    GERD (gastroesophageal reflux disease)    takes Nexium bid, hx erosive esophagitis   Headache(784.0)    occasionally;r/t sinus    History of colon polyps    HTN (hypertension)    Hx of amputation of lesser toe (HCC)    sees podiatrist   Hyperlipemia    IBS (irritable bowel syndrome)    Insomnia    takes Elavil nightly   Joint pain    Neuropathy    Neuropathy    Osteomyelitis (HCC)    Pneumonia    89/2/22- patient denies   PONV (postoperative nausea and vomiting)    medication did not help with surgery on 03/28/20   Seasonal allergies    takes Zyrtec daily   Sinus tachycardia     Family History  Problem Relation Age of Onset   Lung cancer Mother 74       smoked heavily   Emphysema Mother    Hypertension Father    Hyperlipidemia Father    Diabetes Father    Coronary artery disease Father    Dementia Father    COPD Father        smoked   Stomach cancer Paternal Aunt    Brain cancer Paternal Uncle    Irritable bowel syndrome Other        Several family members on fathers side    Diabetes Other    Stomach cancer Maternal Aunt    Lung cancer  Paternal Uncle    Heart disease Paternal Uncle    Colon cancer Neg Hx     Past Surgical History:  Procedure Laterality Date  AMPUTATION  12/28/2011   Procedure: AMPUTATION DIGIT;  Surgeon: Nadara Mustard, MD;  Location: [facility] OR;  Service: Orthopedics;  Laterality: Right;  Right Foot 2nd Toe Amputation at MTP (metatarsophalangeal joint)   AMPUTATION Right 04/25/2012   Procedure: Right Foot 3rd Toe Amputation;  Surgeon: Nadara Mustard, MD;  Location: [facility] OR;  Service: Orthopedics;  Laterality: Right;  Right Foot Third Toe Amputation    AMPUTATION Right 07/27/2012   Procedure: Right 4th Toe Amputation at Metatarsophalangeal;  Surgeon: Nadara Mustard, MD;  Location: [facility] OR;  Service: Orthopedics;  Laterality: Right;  Right 4th Toe Amputation at Metatarsophalangeal   AMPUTATION Left 07/15/2016   Procedure: Left 2nd Ray Amputation;  Surgeon: Nadara Mustard, MD;  Location: [facility] OR;  Service: Orthopedics;  Laterality: Left;   AMPUTATION Left 11/18/2016   Procedure: Left 3rd and 4th Ray Amputation;  Surgeon: Nadara Mustard, MD;  Location: [facility] OR;  Service: Orthopedics;  Laterality: Left;   AMPUTATION Right 03/29/2017   Procedure: RIGHT FOOT 3RD AND 4TH RAY AMPUTATION;  Surgeon: Nadara Mustard, MD;  Location: [facility] OR;  Service: Orthopedics;  Laterality: Right;   AMPUTATION Left 08/12/2020   Procedure: LEFT TRANSMETATARSAL AMPUTATION;  Surgeon: Nadara Mustard, MD;  Location: [facility] OR;  Service: Orthopedics;  Laterality: Left;   APPLICATION OF WOUND VAC Left 08/12/2020   Procedure: APPLICATION OF WOUND VAC;  Surgeon: Nadara Mustard, MD;  Location: [facility] OR;  Service: Orthopedics;  Laterality: Left;   COLONOSCOPY     LAPAROSCOPIC APPENDECTOMY  01/05/2011   Procedure: APPENDECTOMY LAPAROSCOPIC;  Surgeon: Valarie Merino, MD;  Location: [facility] ORS;  Service: General;  Laterality: N/A;   OOPHORECTOMY  2001   ROTATOR CUFF REPAIR Right    x 2   TUBAL LIGATION     VAGINAL HYSTERECTOMY  2001   Social History   Occupational History    Occupation: Retired   Tobacco Use   Smoking status: Never   Smokeless tobacco: Never  Vaping Use   Vaping Use: Never used  Substance and Sexual Activity   Alcohol use: Never   Drug use: Never   Sexual activity: Yes    Birth control/protection: Surgical

## 2022-06-27 MED ORDER — TIRZEPATIDE 5 MG/0.5ML ~~LOC~~ SOAJ: 5.0000 mg | SUBCUTANEOUS | 0 refills | Status: DC

## 2022-06-27 NOTE — Addendum Note (Signed)
Addended by: Pollie Meyer on: 06/27/2022 04:30 PM   Modules accepted: Orders

## 2022-06-27 NOTE — Telephone Encounter (Signed)
Pt has been notified, refills have been sent.

## 2022-07-13 ENCOUNTER — Encounter: Payer: Self-pay | Admitting: Internal Medicine

## 2022-07-13 ENCOUNTER — Ambulatory Visit (INDEPENDENT_AMBULATORY_CARE_PROVIDER_SITE_OTHER): Payer: Medicare Other | Admitting: Internal Medicine

## 2022-07-13 VITALS — BP 124/70 | HR 69 | Ht 66.0 in | Wt 198.0 lb

## 2022-07-13 DIAGNOSIS — E785 Hyperlipidemia, unspecified: Secondary | ICD-10-CM

## 2022-07-13 DIAGNOSIS — Z794 Long term (current) use of insulin: Secondary | ICD-10-CM | POA: Diagnosis not present

## 2022-07-13 DIAGNOSIS — Z7984 Long term (current) use of oral hypoglycemic drugs: Secondary | ICD-10-CM

## 2022-07-13 DIAGNOSIS — E1142 Type 2 diabetes mellitus with diabetic polyneuropathy: Secondary | ICD-10-CM

## 2022-07-13 DIAGNOSIS — E1165 Type 2 diabetes mellitus with hyperglycemia: Secondary | ICD-10-CM

## 2022-07-13 DIAGNOSIS — E1169 Type 2 diabetes mellitus with other specified complication: Secondary | ICD-10-CM | POA: Diagnosis not present

## 2022-07-13 DIAGNOSIS — E119 Type 2 diabetes mellitus without complications: Secondary | ICD-10-CM

## 2022-07-13 DIAGNOSIS — Z7985 Long-term (current) use of injectable non-insulin antidiabetic drugs: Secondary | ICD-10-CM | POA: Diagnosis not present

## 2022-07-13 DIAGNOSIS — G63 Polyneuropathy in diseases classified elsewhere: Secondary | ICD-10-CM

## 2022-07-13 LAB — HEMOGLOBIN A1C: Hemoglobin A1C: 6.5

## 2022-07-13 NOTE — Progress Notes (Signed)
Subjective:     Patient ID: QUANIESHA MUKHERJEE, female   DOB: [birth year], 77 y.o.   MRN:   HPI Ms. Petri is a pleasant 77 y.o. 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  Last visit 4 months ago.  Interim history: No blurry vision, nausea, chest pain, increased urination. She was able to start [facility] at last visit.  She lost 23 pounds since then!  She does have constipation with it and she is trying different supplements for this. Also, decreased appetite. She has RUQ AP for 1-2 days after the [facility] dose.  However, overall, she mentions that she feels much better on Mounjaro.  Reviewed HbA1c levels: Lab Results  Component Value Date   HGBA1C 8.4 (A) 03/02/2022   HGBA1C 9.0 (A) 09/09/2021   HGBA1C 7.8 (A) 05/06/2021   She is on:: - Metformin 1000 mg 2x a day - Tresiba 34 >> 44 units daily >> Off now for 3 mo Insulin Before breakfast Before lunch Before dinner At bedtime  Regular 25(-30) >> 10-20 - 25(-30) >> 10-20   - Mounjaro - [place] approved >> 2.5 >> 5 mg weekly  She also tried: Farxiga 03/2020 -could not start b/c of  $$$ Levemir 47 units in am and 37 units in pm >> $800 for 3 mo supply Januvia 100 mg daily - $450 for 3 mo Invokana 100 mg - $455 for 3 mo Took Byetta before. We could not use Trulicity as she developed GI symptoms with it. We discussed about starting her on a VGo mechanical pump in the past. She had an appointment with diabetes education but decided not to pursue it. We also tried a sliding scale of regular insulin in the past but she was not using it. We tried Trulicity but she could not tolerate it due to nausea, diarrhea, constipation, weakness. She was previously on NPH, but changed to [place] in 04/2021.  She checks her sugars 2-4 times a day per review of her log:  - am:  98, 129-230, 252 >> 122,  150-289 >> 165-315 >> 99-205, 223 >> 107-241 - 2h after b'fast : 146-314 >> 190-295 >> 237-278 >> 218-334 >> 100-168 >> 141-201 - prelunch:    214-314  >> 158-207 >> 107-354 >> 212-364 >> 135-259 >> 115-174, 252 - 2-3h after lunch:   130-204, 242 >> 121-337 >> 192-335, 402 >> 177-309 >> 99-209 - before dinner:  72, 128-195, 236 >> 58, 153-217 >> 156-345 >> 165-314 >> 82-200 - after dinner:  168-337 >> 203-215-365 >> 126, 223-323 >> 141-232, 257 - bedtime: 127-285 >> 119-222, 302 >> 94-373 >> 124-297 >> 171-281, 380 >> 86-245 - nighttime: 60, 79, 89-246 >> 52, 68-208, 330 >> 53, 75, 100-245, 373 >> 80-180 Lowest: 40s x3 (did not eat supper) >> ... 52 >> 63 >> 53 >> 82.  Has hypoglycemia awareness in the 70s. Highest: 388 >> 402 >> 372>> 257.  Meals: - Breakfast: egg, bacon, toast; grits; biscuit; fruit; sometimes skips >> 2 slices of toast + butter - Lunch: 1/2 PB sandwich or soup and yoghurt, sometimes crackers - Dinner: meat + vegetables + some starch - Snacks: fruit   + Mild CKD: BUN  Date Value Ref Range Status  11/05/2021 23 6 - 23 mg/dL Final  96/04/5407 23 6 - 23 mg/dL Final   Creatinine, Ser  Date Value Ref Range Status  11/05/2021 0.98 0.40 -  1.20 mg/dL Final  81/19/1478 2.95 0.40 - 1.20 mg/dL Final  On irbesartan.  -+ HL; latest lipid panel: Lab Results  Component Value Date   CHOL 122 11/05/2021   HDL 39.90 11/05/2021   LDLCALC 50 11/05/2021   TRIG 159.0 (H) 11/05/2021   CHOLHDL 3 11/05/2021  On rosuvastatin 20. She has fatty liver >> was advised to lose weight.  - last eye exam: 03/02/2022: No DR. Prev. Mild NPDR OU, without macular edema (Dr. Luciana Axe).  -+ Numbness and tingling in feet-previously on amitriptyline, now on Neurontin.  Last foot exam 09/02/2021.  Derm: Dr. Donzetta Starch.   Review of Systems  + see HPI  I reviewed pt's medications, allergies, PMH, social hx, family hx, and changes were documented in the history of present illness. Otherwise,  unchanged from my initial visit note.  Past Medical History:  Diagnosis Date   Alopecia    Anemia, mild    Arthritis    Chronic cough    sees pulmonologist   Chronic pain    chest wall and abd - s/p extensive eval   Diabetes mellitus with neuropathy (HCC)    sees endocrine   Diverticulosis    Dyspnea    Fatty liver    GERD (gastroesophageal reflux disease)    takes Nexium bid, hx erosive esophagitis   Headache(784.0)    occasionally;r/t sinus    History of colon polyps    HTN (hypertension)    Hx of amputation of lesser toe (HCC)    sees podiatrist   Hyperlipemia    IBS (irritable bowel syndrome)    Insomnia    takes Elavil nightly   Joint pain    Neuropathy    Neuropathy    Osteomyelitis (HCC)    Pneumonia    89/2/22- patient denies   PONV (postoperative nausea and vomiting)    medication did not help with surgery on 03/28/20   Seasonal allergies    takes Zyrtec daily   Sinus tachycardia    Past Surgical History:  Procedure Laterality Date   AMPUTATION  12/28/2011   Procedure: AMPUTATION DIGIT;  Surgeon: Nadara Mustard, MD;  Location: [facility] OR;  Service: Orthopedics;  Laterality: Right;  Right Foot 2nd Toe Amputation at MTP (metatarsophalangeal joint)   AMPUTATION Right 04/25/2012   Procedure: Right Foot 3rd Toe Amputation;  Surgeon: Nadara Mustard, MD;  Location[facility] OR;  Service: Orthopedics;  Laterality: Right;  Right Foot Third Toe Amputation    AMPUTATION Right 07/27/2012   Procedure: Right 4th Toe Amputation at Metatarsophalangeal;  Surgeon: Nadara Mustard, MD;  Location: [facility] OR;  Service: Orthopedics;  Laterality: Right;  Right 4th Toe Amputation at Metatarsophalangeal   AMPUTATION Left 07/15/2016   Procedure: Left 2nd Ray Amputation;  Surgeon: Nadara Mustard, MD;  Location: [facility] OR;  Service: Orthopedics;  Laterality: Left;   AMPUTATION Left 11/18/2016   Procedure: Left 3rd and 4th Ray Amputation;  Surgeon: Nadara Mustard, MD;  Location: [facility] OR;  Service: Orthopedics;   Laterality: Left;   AMPUTATION Right 03/29/2017   Procedure: RIGHT FOOT 3RD AND 4TH RAY AMPUTATION;  Surgeon: Nadara Mustard, MD;  Location: [facility] OR;  Service: Orthopedics;  Laterality: Right;   AMPUTATION Left 08/12/2020   Procedure: LEFT TRANSMETATARSAL AMPUTATION;  Surgeon: Nadara Mustard, MD;  Location: [facility] OR;  Service: Orthopedics;  Laterality: Left;   APPLICATION OF WOUND VAC Left 08/12/2020   Procedure: APPLICATION OF WOUND VAC;  Surgeon: Nadara Mustard, MD;  Location:  [facility] OR;  Service: Orthopedics;  Laterality: Left;   COLONOSCOPY     LAPAROSCOPIC APPENDECTOMY  01/05/2011   Procedure: APPENDECTOMY LAPAROSCOPIC;  Surgeon: Valarie Merino, MD;  Location: [facility] ORS;  Service: General;  Laterality: N/A;   OOPHORECTOMY  2001   ROTATOR CUFF REPAIR Right    x 2   TUBAL LIGATION     VAGINAL HYSTERECTOMY  2001   Social History   Socioeconomic History   Marital status: Married    Spouse name: Not on file   Number of children: 2   Years of education: Not on file   Highest education level: Not on file  Occupational History   Occupation: Retired   Tobacco Use   Smoking status: Never   Smokeless tobacco: Never  Vaping Use   Vaping Use: Never used  Substance and Sexual Activity   Alcohol use: Never   Drug use: Never   Sexual activity: Yes    Birth control/protection: Surgical  Other Topics Concern   Not on file  Social History Narrative   Caffeine daily    [employer], [employer] no diploma   Married '66   1 dtr- '78; 1 son '71; 2 grandchildren   Occupation: retired 04   Dad with alzheimers-had to place in [place] (summer '10)         Social Determinants of Corporate investment banker Strain: Not on Ship broker Insecurity: Not on file  Transportation Needs: Not on file  Physical Activity: Not on file  Stress: Not on file  Social Connections: Not on file  Intimate Partner Violence: Not on file   Current Outpatient Medications on File Prior to Visit  Medication Sig Dispense Refill    acetaminophen (TYLENOL) 500 MG tablet Take 500 mg by mouth every 8 (eight) hours as needed for mild pain.      azelastine (ASTELIN) 0.1 % nasal spray Place 2 sprays into both nostrils 2 (two) times daily. 30 mL 12   betamethasone dipropionate (DIPROLENE) 0.05 % cream Apply 1 application topically 2 (two) times daily as needed (IRRITATION). 30 g 0   Blood Glucose Monitoring Suppl (ONE TOUCH ULTRA 2) w/Device KIT Use to check blood sugar 1 kit 0   Calcium Carb-Cholecalciferol (CALCIUM + VITAMIN D3) 600-10 MG-MCG TABS Take 2 tablets by mouth daily. 90 tablet 3   cetirizine (ZYRTEC) 10 MG tablet Take 1 tablet (10 mg total) by mouth daily. 30 tablet 11   clotrimazole (CLOTRIMAZOLE AF) 1 % cream Apply 1 Application topically 2 (two) times daily. 35.4 g 2   Fluocinolone Acetonide 0.01 % OIL Place 1-2 drops into both ears daily. As needed 20 mL 0   gabapentin (NEURONTIN) 100 MG capsule TAKE 2 TO 3 CAPSULES AT    BEDTIME 180 capsule 3   glucagon (GLUCAGEN) 1 MG SOLR injection Inject 1 mg into the muscle once as needed for up to 1 dose for low blood sugar. (Patient not taking: Reported on 06/16/2022) 1 each 11   glucose blood (ONETOUCH ULTRA) test strip USE 1 STRIP TO CHECK GLUCOSE 3-4 TIMES DAILY 400 each 3   insulin degludec (TRESIBA FLEXTOUCH) 200 UNIT/ML FlexTouch Pen Inject 34 Units into the skin daily. (Patient not taking: Reported on 06/16/2022) 12 mL 3   Insulin Pen Needle 32G X 4 MM MISC Use 1x a day 100 each 3   insulin regular (NOVOLIN R RELION) 100 units/mL injection Inject 0.2-0.3 mLs (20-30 Units total) into the skin 3 (three) times daily before meals.  Per sliding scale     Insulin Syringe-Needle U-100 (RELION INSULIN SYRINGE 1ML/31G) 31G X 5/16" 1 ML MISC USE 2 TIMES A DAY (Patient not taking: Reported on 06/16/2022) 100 each 2   irbesartan-hydrochlorothiazide (AVALIDE) 150-12.5 MG tablet Take 1 tablet by mouth daily. 90 tablet 3   metFORMIN (GLUCOPHAGE) 1000 MG tablet TAKE 1 TABLET TWICE DAILY  WITH  MEALS 180 tablet 0   metoprolol succinate (TOPROL-XL) 25 MG 24 hr tablet Take 1 tablet (25 mg total) by mouth daily. 90 tablet 0   omeprazole (PRILOSEC) 40 MG capsule TAKE 1 CAPSULE TWICE DAILY 180 capsule 3   OVER THE COUNTER MEDICATION Apply 1-3 application topically at bedtime as needed (foot pain). TOPRICIN FOOT CREAM - NEUROPATHY FOOT CREAM **APPLIES TO BOTH FEET AT BEDTIME**     Probiotic Product (PROBIOTIC DAILY PO) Take 1 capsule by mouth daily.     rosuvastatin (CRESTOR) 20 MG tablet TAKE 1 TABLET DAILY 90 tablet 3   silver sulfADIAZINE (SILVADENE) 1 % cream Apply 1 Application topically daily. 50 g 0   tirzepatide (MOUNJARO) 5 MG/0.5ML Pen Inject 5 mg into the skin once a week. 6 mL 0   vitamin B-12 (CYANOCOBALAMIN) 1000 MCG tablet Take 1,000 mcg daily by mouth.     vitamin C (ASCORBIC ACID) 500 MG tablet Take 500 mg by mouth daily.     No current facility-administered medications on file prior to visit.   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   Codeine Other (See Comments)    Makes her crazy   Propoxyphene Hcl Itching    *DARVOCET    Family History  Problem Relation Age of Onset   Lung cancer Mother 45       smoked heavily   Emphysema Mother    Hypertension Father    Hyperlipidemia Father    Diabetes Father    Coronary artery disease Father    Dementia Father    COPD Father        smoked   Stomach cancer Paternal Aunt    Brain cancer Paternal Uncle    Irritable bowel syndrome Other        Several family members on fathers side    Diabetes Other    Stomach cancer Maternal Aunt    Lung cancer Paternal Uncle    Heart disease Paternal Uncle    Colon cancer Neg Hx     Objective:   Physical Exam BP 124/70   Pulse 69   Ht 5\' 6"  (1.676 m)   Wt 198 lb (89.8 kg)   SpO2 96%   BMI 31.96 kg/m   Wt Readings from Last 3 Encounters:  07/13/22 198 lb (89.8 kg)  06/16/22 199 lb 8 oz (90.5 kg)  03/22/22 221 lb 12.8  oz (100.6 kg)   Constitutional: overweight, in NAD Eyes: EOMI, no exophthalmos ENT: no thyromegaly, no cervical lymphadenopathy Cardiovascular: RRR, No MRG Respiratory: CTA B Musculoskeletal: + deformities (several amputations of toes, Charcot deformity of the left foot) Skin: no rashes Neurological: no tremor with outstretched hands  Assessment:     1. DM2, uncontrolled, insulin-dependent, with complications: - diabetic peripheral neuropathy - 3 toe amputations - after infected diabetic toe ulcers >> OM: R 2nd toe amputated on 12/28/2011 R 3rd toe amputated on 04/25/2012 R 4th toe amputated on 07/27/2012 L 2nd toe amputated on 07/15/2016 L1st and 5th toes amputated 11/18/2016 L transmetatarsal amputation 08/12/2020    2.  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 HL  Plan:     1. DM2 - pt with longstanding, uncontrolled, type 2 diabetes, on basal/bolus insulin regimen and metformin, to which we added Mounjaro at last visit.  This was covered by her insurance.  At that time, sugars improved from before, but still quite fluctuating.  The HbA1c was above target, improved, to 8.4%.  I also advised her to take a higher dose of insulin before breakfast.  At this visit, on Mounjaro, she lost a significant amount of weight, more than 20 pounds and sugars improved.  She feels better, but she still has constipation on it.  We discussed about retrying MiraLAX and titrating the dose up, for effect.  She also wants to try Ex-Lax.  She also describes mild right upper quadrant pain 1-2 days of Mounjaro dosing.  No pain with compression of the abdomen.  No nausea.  She is preparing to have another appointment with Dr. Russella Dar and we discussed about letting him know about the pain to see if further investigation is needed.  I explained that cholelithiasis can be a side effect of Mounjaro. -Sugars appear to have been improved, however, they are not as low as I would have expected.  She has blood sugars after 200s in the morning.   Upon questioning, however, she stopped [place] completely after having some lows upon starting [place].  We discussed that she probably needs some Tresiba at night, but definitely not as much.  I recommended to start back at 12 units and increase as needed.  She also uses lower doses of regular insulin, which we will continue.  Will continue the rest of the regimen. - I advised her to: Patient Instructions  Please continue: - Metformin 1000 mg 2x a day - Mounjaro 5 mg weekly  Insulin Before breakfast Before lunch Before dinner At bedtime  Regular 10-20 - 10-20    Start: - Tresiba 12 units daily and increase the dose by 2-4 units every 4 days until am sugars are <140  Please return in 4 months with your sugar log  - we checked her HbA1c: 6.5% (lower) - advised to check sugars at different times of the day - 4x a day, rotating check times - advised for yearly eye exams >> she is UTD - return to clinic in 4 months  2. PN -Related to diabetes -She can just have numbness and tingling, mostly in the left big toe -Previously on amitriptyline, currently on Neurontin 2 capsules at night.  I usually refill this for her.  3. HL -Reviewed the latest lipid panel from 10/2021: Fractions at goal: Lab Results  Component Value Date   CHOL 122 11/05/2021   HDL 39.90 11/05/2021   LDLCALC 50 11/05/2021   TRIG 159.0 (H) 11/05/2021   CHOLHDL 3 11/05/2021  -on Crestor 10 mg daily without side effects  Carlus Pavlov, MD PhD [facility] Endocrinology

## 2022-07-13 NOTE — Patient Instructions (Addendum)
Please continue: - Metformin 1000 mg 2x a day - Mounjaro 5 mg weekly  Insulin Before breakfast Before lunch Before dinner At bedtime  Regular 10-20 - 10-20    Start: - Tresiba 12 units daily and increase the dose by 2-4 units every 4 days until am sugars are <140  Please return in 4 months with your sugar log

## 2022-07-19 ENCOUNTER — Ambulatory Visit (INDEPENDENT_AMBULATORY_CARE_PROVIDER_SITE_OTHER): Payer: Medicare Other | Admitting: Orthopedic Surgery

## 2022-07-19 DIAGNOSIS — L97511 Non-pressure chronic ulcer of other part of right foot limited to breakdown of skin: Secondary | ICD-10-CM | POA: Diagnosis not present

## 2022-07-19 DIAGNOSIS — L97521 Non-pressure chronic ulcer of other part of left foot limited to breakdown of skin: Secondary | ICD-10-CM | POA: Diagnosis not present

## 2022-07-19 DIAGNOSIS — Z89432 Acquired absence of left foot: Secondary | ICD-10-CM

## 2022-07-26 ENCOUNTER — Telehealth: Payer: Self-pay | Admitting: Orthopedic Surgery

## 2022-07-26 NOTE — Telephone Encounter (Signed)
Can you please call this pt and ask her if she will come in and see Erin tomorrow morning? If not we can work her on the sch with Dr. Lajoyce Corners on Thursday and you can tell me what time to open. Thank you!

## 2022-07-26 NOTE — Telephone Encounter (Signed)
I scheduled pt for tomorrow morning with Denny Peon. FYI

## 2022-07-26 NOTE — Telephone Encounter (Signed)
Patient called would like to be worked in. She says the ulcer on her foot is getting worst. Cb [phone]

## 2022-07-27 ENCOUNTER — Encounter: Payer: Self-pay | Admitting: Family

## 2022-07-27 ENCOUNTER — Other Ambulatory Visit (INDEPENDENT_AMBULATORY_CARE_PROVIDER_SITE_OTHER): Payer: Medicare Other

## 2022-07-27 ENCOUNTER — Ambulatory Visit (INDEPENDENT_AMBULATORY_CARE_PROVIDER_SITE_OTHER): Payer: Medicare Other | Admitting: Family

## 2022-07-27 DIAGNOSIS — M79671 Pain in right foot: Secondary | ICD-10-CM

## 2022-07-27 NOTE — Progress Notes (Signed)
Office Visit Note   [name]           Date of Birth: [birth year]           MRN: [id number] Visit Date: 07/27/2022              Requested by: Ardith Dark, MD [address] [place],  [place] [address] PCP: Ardith Dark, MD  Chief Complaint  Patient presents with   Right Foot - Follow-up      HPI: The patient is a 77 year old woman who is seen as a work in for concern of increasing aching pain shooting pain at night in the right foot in the area of her plantar ulcer.  Denies fevers or chills no change in the wound or drainage no redness  Assessment & Plan: Visit Diagnoses:  1. Pain in right foot     Plan: No clinical sign of infection.  reassurance provided  Follow-Up Instructions: No follow-ups on file.   Ortho Exam  Patient is alert, oriented, no adenopathy, well-dressed, normal affect, normal respiratory effort. On examination of the right foot the Wagner grade 1 ulcer beneath the fourth metatarsal head is 1 cm in diameter there is no erythema warmth no drainage this is debrided of nonviable hyperkeratotic tissue with a 10 blade knife after informed consent back to viable tissue touch with silver nitrate.  Band-Aid applied.  Imaging: No results found. No images are attached to the encounter.  Labs: Lab Results  Component Value Date   HGBA1C 8.4 (A) 03/02/2022   HGBA1C 9.0 (A) 09/09/2021   HGBA1C 7.8 (A) 05/06/2021   ESRSEDRATE 51 (H) 12/24/2019   ESRSEDRATE 36 (H) 02/24/2017   CRP 13.2 (H) 12/24/2019   CRP 1.2 (H) 02/24/2017   LABURIC 5.5 09/01/2015   REPTSTATUS 06/03/2020 FINAL 05/29/2020   CULT  05/29/2020    NO GROWTH 5 DAYS Performed at [facility], [address] [place], [place] [address]    LABORGA GROUP B STREP (S.AGALACTIAE) ISOLATED 12/26/2014     Lab Results  Component Value Date   ALBUMIN 4.1 11/05/2021   ALBUMIN 4.1 11/05/2021   ALBUMIN 3.8 09/08/2020   PREALBUMIN 19.4 03/05/2012    No results found for:  "MG" No results found for: "VD25OH"  Lab Results  Component Value Date   PREALBUMIN 19.4 03/05/2012      Latest Ref Rng & Units 06/16/2022    8:19 AM 12/08/2021    7:59 AM 11/05/2021    9:13 AM  CBC EXTENDED  WBC 4.0 - 10.5 K/uL 12.6  13.1  11.7   RBC 3.87 - 5.11 MIL/uL 4.07  4.21  4.13   Hemoglobin 12.0 - 15.0 g/dL 82.9  56.2  13.0   HCT 36.0 - 46.0 % 35.1  37.6  35.9   Platelets 150 - 400 K/uL 425  368  364.0   NEUT# 1.7 - 7.7 K/uL 8.5  8.6    Lymph# 0.7 - 4.0 K/uL 3.0  3.4       There is no height or weight on file to calculate BMI.  Orders:  Orders Placed This Encounter  Procedures   XR Foot 2 Views Right   No orders of the defined types were placed in this encounter.    Procedures: No procedures performed  Clinical Data: No additional findings.  ROS:  All other systems negative, except as noted in the HPI. Review of Systems  Objective: Vital Signs: There were no vitals taken for  this visit.  Specialty Comments:  No specialty comments available.  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) 10/23/2019   Nuclear sclerotic cataract of both eyes 10/23/2019   Posterior vitreous detachment of right eye 10/23/2019   Retinal hemorrhage of left eye 10/23/2019   Impingement syndrome of right shoulder 07/06/2017   Iron deficiency anemia 05/11/2017   Morbid obesity (HCC) 04/24/2017   Anemia 02/24/2017   Baker's cyst 07/10/2016   Onychomycosis 12/07/2015   Upper airway cough syndrome 03/05/2014   Hypertension associated with diabetes (HCC) 12/05/2006   T2DM (type 2 diabetes mellitus) (HCC) 10/12/2006   Dyslipidemia  associated with type 2 diabetes mellitus (HCC) 10/12/2006   Allergic rhinitis 10/12/2006   GERD - Followed by Dr. Russella Dar 10/12/2006   IRRITABLE BOWEL SYNDROME - followed by Dr. Russella Dar 10/12/2006   Peripheral neuropathy 10/11/2006   ALOPECIA NEC 10/11/2006   Past Medical History:  Diagnosis Date   Alopecia    Anemia, mild    Arthritis    Chronic cough    sees pulmonologist   Chronic pain    chest wall and abd - s/p extensive eval   Diabetes mellitus with neuropathy (HCC)    sees endocrine   Diverticulosis    Dyspnea    Fatty liver    GERD (gastroesophageal reflux disease)    takes Nexium bid, hx erosive esophagitis   Headache(784.0)    occasionally;r/t sinus    History of colon polyps    HTN (hypertension)    Hx of amputation of lesser toe (HCC)    sees podiatrist   Hyperlipemia    IBS (irritable bowel syndrome)    Insomnia    takes Elavil nightly   Joint pain    Neuropathy    Neuropathy    Osteomyelitis (HCC)    Pneumonia    89/2/22- patient denies   PONV (postoperative nausea and vomiting)    medication did not help with surgery on 03/28/20   Seasonal allergies    takes Zyrtec daily   Sinus tachycardia     Family History  Problem Relation Age of Onset   Lung cancer Mother 47       smoked heavily   Emphysema Mother    Hypertension Father    Hyperlipidemia Father    Diabetes Father    Coronary artery disease Father    Dementia Father    COPD Father        smoked   Stomach cancer Paternal Aunt    Brain cancer Paternal Uncle    Irritable bowel syndrome Other        Several family members on fathers side    Diabetes Other    Stomach cancer Maternal Aunt    Lung cancer Paternal Uncle    Heart disease Paternal Uncle    Colon cancer Neg Hx     Past Surgical History:  Procedure Laterality Date   AMPUTATION  12/28/2011   Procedure: AMPUTATION DIGIT;  Surgeon: Nadara Mustard, MD;  Location: [facility] OR;  Service: Orthopedics;  Laterality: Right;  Right Foot 2nd Toe  Amputation at MTP (metatarsophalangeal joint)   AMPUTATION Right 04/25/2012  Procedure: Right Foot 3rd Toe Amputation;  Surgeon: Nadara Mustard, MD;  Location: [facility] OR;  Service: Orthopedics;  Laterality: Right;  Right Foot Third Toe Amputation    AMPUTATION Right 07/27/2012   Procedure: Right 4th Toe Amputation at Metatarsophalangeal;  Surgeon: Nadara Mustard, MD;  Location: [facility] OR;  Service: Orthopedics;  Laterality: Right;  Right 4th Toe Amputation at Metatarsophalangeal   AMPUTATION Left 07/15/2016   Procedure: Left 2nd Ray Amputation;  Surgeon: Nadara Mustard, MD;  Location: [facility] OR;  Service: Orthopedics;  Laterality: Left;   AMPUTATION Left 11/18/2016   Procedure: Left 3rd and 4th Ray Amputation;  Surgeon: Nadara Mustard, MD;  Location: [facility] OR;  Service: Orthopedics;  Laterality: Left;   AMPUTATION Right 03/29/2017   Procedure: RIGHT FOOT 3RD AND 4TH RAY AMPUTATION;  Surgeon: Nadara Mustard, MD;  Location: [facility] OR;  Service: Orthopedics;  Laterality: Right;   AMPUTATION Left 08/12/2020   Procedure: LEFT TRANSMETATARSAL AMPUTATION;  Surgeon: Nadara Mustard, MD;  Location: [facility] OR;  Service: Orthopedics;  Laterality: Left;   APPLICATION OF WOUND VAC Left 08/12/2020   Procedure: APPLICATION OF WOUND VAC;  Surgeon: Nadara Mustard, MD;  Location: [facility] OR;  Service: Orthopedics;  Laterality: Left;   COLONOSCOPY     LAPAROSCOPIC APPENDECTOMY  01/05/2011   Procedure: APPENDECTOMY LAPAROSCOPIC;  Surgeon: Valarie Merino, MD;  Location: [facility] ORS;  Service: General;  Laterality: N/A;   OOPHORECTOMY  2001   ROTATOR CUFF REPAIR Right    x 2   TUBAL LIGATION     VAGINAL HYSTERECTOMY  2001   Social History   Occupational History   Occupation: Retired   Tobacco Use   Smoking status: Never   Smokeless tobacco: Never  Vaping Use   Vaping status: Never Used  Substance and Sexual Activity   Alcohol use: Never   Drug use: Never   Sexual activity: Yes    Birth control/protection: Surgical

## 2022-07-28 ENCOUNTER — Ambulatory Visit: Payer: Medicare Other | Admitting: Orthopedic Surgery

## 2022-07-29 ENCOUNTER — Encounter: Payer: Self-pay | Admitting: Orthopedic Surgery

## 2022-07-29 NOTE — Progress Notes (Signed)
Office Visit Note   [name]           Date of Birth: 06-14-1945           MRN: [id number] Visit Date: 07/19/2022              Requested by: Ardith Dark, MD [address] [place],  [place] [address] PCP: Ardith Dark, MD  Chief Complaint  Patient presents with   Right Foot - Follow-up   Left Foot - Follow-up      HPI: Chronic ulcer both feet, new ulcer dorsal right foot  Assessment & Plan: Visit Diagnoses:  1. Non-pressure chronic ulcer of other part of right foot limited to breakdown of skin (HCC)   2. Ulcer of left foot, limited to breakdown of skin (HCC)   3. 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HCC)     Plan: donut applied to right foot, right foot ulcer debrided, callous pared left TMA  Follow-Up Instructions: Return in about 3 weeks (around 08/09/2022).   Ortho Exam  Patient is alert, oriented, no adenopathy, well-dressed, normal affect, normal respiratory effort. New dorsal ulcer right foot, 1 cm diameter, 4th MTH ulcer debrided, 3 cm diameter after debridement  Imaging: No results found. No images are attached to the encounter.  Labs: Lab Results  Component Value Date   HGBA1C 8.4 (A) 03/02/2022   HGBA1C 9.0 (A) 09/09/2021   HGBA1C 7.8 (A) 05/06/2021   ESRSEDRATE 51 (H) 12/24/2019   ESRSEDRATE 36 (H) 02/24/2017   CRP 13.2 (H) 12/24/2019   CRP 1.2 (H) 02/24/2017   LABURIC 5.5 09/01/2015   REPTSTATUS 06/03/2020 FINAL 05/29/2020   CULT  05/29/2020    NO GROWTH 5 DAYS Performed at [facility], [address], [place], [place] [address]    LABORGA GROUP B STREP (S.AGALACTIAE) ISOLATED 12/26/2014     Lab Results  Component Value Date   ALBUMIN 4.1 11/05/2021   ALBUMIN 4.1 11/05/2021   ALBUMIN 3.8 09/08/2020   PREALBUMIN 19.4 03/05/2012    No results found for: "MG" No results found for: "VD25OH"  Lab Results  Component Value Date   PREALBUMIN 19.4 03/05/2012      Latest Ref Rng & Units 06/16/2022     8:19 AM 12/08/2021    7:59 AM 11/05/2021    9:13 AM  CBC EXTENDED  WBC 4.0 - 10.5 K/uL 12.6  13.1  11.7   RBC 3.87 - 5.11 MIL/uL 4.07  4.21  4.13   Hemoglobin 12.0 - 15.0 g/dL 00.8  67.6  19.5   HCT 36.0 - 46.0 % 35.1  37.6  35.9   Platelets 150 - 400 K/uL 425  368  364.0   NEUT# 1.7 - 7.7 K/uL 8.5  8.6    Lymph# 0.7 - 4.0 K/uL 3.0  3.4       There is no height or weight on file to calculate BMI.  Orders:  No orders of the defined types were placed in this encounter.  No orders of the defined types were placed in this encounter.    Procedures: No procedures performed  Clinical Data: No additional findings.  ROS:  All other systems negative, except as noted in the HPI. Review of Systems  Objective: Vital Signs: There were no vitals taken for this visit.  Specialty Comments:  No specialty comments available.  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) 10/23/2019   Nuclear sclerotic cataract of both eyes 10/23/2019   Posterior vitreous detachment of right eye 10/23/2019   Retinal hemorrhage of left eye 10/23/2019   Impingement syndrome of right shoulder 07/06/2017   Iron deficiency anemia 05/11/2017   Morbid obesity (HCC) 04/24/2017   Anemia 02/24/2017   Baker's cyst 07/10/2016   Onychomycosis 12/07/2015   Upper airway cough syndrome 03/05/2014   Hypertension associated with diabetes (HCC) 12/05/2006   T2DM (type 2 diabetes mellitus) (HCC) 10/12/2006   Dyslipidemia associated with type 2 diabetes mellitus (HCC) 10/12/2006   Allergic rhinitis 10/12/2006   GERD - Followed by Dr. Russella Dar 10/12/2006   IRRITABLE BOWEL  SYNDROME - followed by Dr. Russella Dar 10/12/2006   Peripheral neuropathy 10/11/2006   ALOPECIA NEC 10/11/2006   Past Medical History:  Diagnosis Date   Alopecia    Anemia, mild    Arthritis    Chronic cough    sees pulmonologist   Chronic pain    chest wall and abd - s/p extensive eval   Diabetes mellitus with neuropathy (HCC)    sees endocrine   Diverticulosis    Dyspnea    Fatty liver    GERD (gastroesophageal reflux disease)    takes Nexium bid, hx erosive esophagitis   Headache(784.0)    occasionally;r/t sinus    History of colon polyps    HTN (hypertension)    Hx of amputation of lesser toe (HCC)    sees podiatrist   Hyperlipemia    IBS (irritable bowel syndrome)    Insomnia    takes Elavil nightly   Joint pain    Neuropathy    Neuropathy    Osteomyelitis (HCC)    Pneumonia    89/2/22- patient denies   PONV (postoperative nausea and vomiting)    medication did not help with surgery on 03/28/20   Seasonal allergies    takes Zyrtec daily   Sinus tachycardia     Family History  Problem Relation Age of Onset   Lung cancer Mother 34       smoked heavily   Emphysema Mother    Hypertension Father    Hyperlipidemia Father    Diabetes Father    Coronary artery disease Father    Dementia Father    COPD Father        smoked   Stomach cancer Paternal Aunt    Brain cancer Paternal Uncle    Irritable bowel syndrome Other        Several family members on fathers side    Diabetes Other    Stomach cancer Maternal Aunt    Lung cancer Paternal Uncle    Heart disease Paternal Uncle    Colon cancer Neg Hx     Past Surgical History:  Procedure Laterality Date   AMPUTATION  12/28/2011   Procedure: AMPUTATION DIGIT;  Surgeon: Nadara Mustard, MD;  Location: [facility] OR;  Service: Orthopedics;  Laterality: Right;  Right Foot 2nd Toe Amputation at MTP (metatarsophalangeal joint)   AMPUTATION Right 04/25/2012   Procedure: Right Foot 3rd Toe Amputation;  Surgeon: Nadara Mustard, MD;   Location: [facility] OR;  Service: Orthopedics;  Laterality: Right;  Right Foot Third Toe Amputation  AMPUTATION Right 07/27/2012   Procedure: Right 4th Toe Amputation at Metatarsophalangeal;  Surgeon: Nadara Mustard, MD;  Location: [facility] OR;  Service: Orthopedics;  Laterality: Right;  Right 4th Toe Amputation at Metatarsophalangeal   AMPUTATION Left 07/15/2016   Procedure: Left 2nd Ray Amputation;  Surgeon: Nadara Mustard, MD;  Location: [facility] OR;  Service: Orthopedics;  Laterality: Left;   AMPUTATION Left 11/18/2016   Procedure: Left 3rd and 4th Ray Amputation;  Surgeon: Nadara Mustard, MD;  Location: [facility] OR;  Service: Orthopedics;  Laterality: Left;   AMPUTATION Right 03/29/2017   Procedure: RIGHT FOOT 3RD AND 4TH RAY AMPUTATION;  Surgeon: Nadara Mustard, MD;  Location: [facility] OR;  Service: Orthopedics;  Laterality: Right;   AMPUTATION Left 08/12/2020   Procedure: LEFT TRANSMETATARSAL AMPUTATION;  Surgeon: Nadara Mustard, MD;  Location: [facility] OR;  Service: Orthopedics;  Laterality: Left;   APPLICATION OF WOUND VAC Left 08/12/2020   Procedure: APPLICATION OF WOUND VAC;  Surgeon: Nadara Mustard, MD;  Location: [facility] OR;  Service: Orthopedics;  Laterality: Left;   COLONOSCOPY     LAPAROSCOPIC APPENDECTOMY  01/05/2011   Procedure: APPENDECTOMY LAPAROSCOPIC;  Surgeon: Valarie Merino, MD;  Location: [facility] ORS;  Service: General;  Laterality: N/A;   OOPHORECTOMY  2001   ROTATOR CUFF REPAIR Right    x 2   TUBAL LIGATION     VAGINAL HYSTERECTOMY  2001   Social History   Occupational History   Occupation: Retired   Tobacco Use   Smoking status: Never   Smokeless tobacco: Never  Vaping Use   Vaping status: Never Used  Substance and Sexual Activity   Alcohol use: Never   Drug use: Never   Sexual activity: Yes    Birth control/protection: Surgical

## 2022-08-11 ENCOUNTER — Ambulatory Visit: Payer: Medicare Other | Admitting: Orthopedic Surgery

## 2022-08-11 DIAGNOSIS — L97511 Non-pressure chronic ulcer of other part of right foot limited to breakdown of skin: Secondary | ICD-10-CM

## 2022-08-11 DIAGNOSIS — Z89432 Acquired absence of left foot: Secondary | ICD-10-CM | POA: Diagnosis not present

## 2022-08-24 ENCOUNTER — Encounter: Payer: Self-pay | Admitting: Orthopedic Surgery

## 2022-08-24 NOTE — Progress Notes (Signed)
Office Visit Note   [name]           Date of Birth: 1945-11-21           MRN: [id number] Visit Date: 08/11/2022              Requested by: Ardith Dark, MD [address] [place],  [place] [address] PCP: Ardith Dark, MD  Chief Complaint  Patient presents with   Right Foot - Follow-up      HPI: Patient is a 77 year old woman who presents in follow-up for [facility] grade 1 ulcer right foot and callus transmetatarsal amputation on the left.  Assessment & Plan: Visit Diagnoses:  1. 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HCC)   2. Non-pressure chronic ulcer of other part of right foot limited to breakdown of skin (HCC)     Plan: Ulcer debrided on the right foot callus the greeted on the left transmetatarsal amputation.  Continue with the felt relieving donuts.  Follow-Up Instructions: Return in about 4 weeks (around 09/08/2022).   Ortho Exam  Patient is alert, oriented, no adenopathy, well-dressed, normal affect, normal respiratory effort. Examination the left transmetatarsal amputation has increased callus this was pared.  On the right foot there is a Wagner grade 1 ulcer which looks much better.  After informed consent a 10 blade knife was used to debride the skin and soft tissue back to healthy viable granulation tissue.  This measures 1 cm diameter 1 mm deep with healthy granulation tissue without exposed bone or tendon.  Imaging: No results found. No images are attached to the encounter.  Labs: Lab Results  Component Value Date   HGBA1C 8.4 (A) 03/02/2022   HGBA1C 9.0 (A) 09/09/2021   HGBA1C 7.8 (A) 05/06/2021   ESRSEDRATE 51 (H) 12/24/2019   ESRSEDRATE 36 (H) 02/24/2017   CRP 13.2 (H) 12/24/2019   CRP 1.2 (H) 02/24/2017   LABURIC 5.5 09/01/2015   REPTSTATUS 06/03/2020 FINAL 05/29/2020   CULT  05/29/2020    NO GROWTH 5 DAYS Performed at [facility], [address] [place], [place] [address]    LABORGA GROUP B STREP  (S.AGALACTIAE) ISOLATED 12/26/2014     Lab Results  Component Value Date   ALBUMIN 4.1 11/05/2021   ALBUMIN 4.1 11/05/2021   ALBUMIN 3.8 09/08/2020   PREALBUMIN 19.4 03/05/2012    No results found for: "MG" No results found for: "VD25OH"  Lab Results  Component Value Date   PREALBUMIN 19.4 03/05/2012      Latest Ref Rng & Units 06/16/2022    8:19 AM 12/08/2021    7:59 AM 11/05/2021    9:13 AM  CBC EXTENDED  WBC 4.0 - 10.5 K/uL 12.6  13.1  11.7   RBC 3.87 - 5.11 MIL/uL 4.07  4.21  4.13   Hemoglobin 12.0 - 15.0 g/dL 02.7  25.3  66.4   HCT 36.0 - 46.0 % 35.1  37.6  35.9   Platelets 150 - 400 K/uL 425  368  364.0   NEUT# 1.7 - 7.7 K/uL 8.5  8.6    Lymph# 0.7 - 4.0 K/uL 3.0  3.4       There is no height or weight on file to calculate BMI.  Orders:  No orders of the defined types were placed in this encounter.  No orders of the defined types were placed in this encounter.    Procedures: No procedures performed  Clinical Data: No additional findings.  ROS:  All other systems negative, except as noted in the HPI. Review of Systems  Objective: Vital Signs: There were no vitals taken for this visit.  Specialty Comments:  No specialty comments available.  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) 10/23/2019   Nuclear sclerotic cataract of both eyes 10/23/2019   Posterior vitreous detachment of right eye 10/23/2019   Retinal hemorrhage of left eye 10/23/2019   Impingement syndrome of right shoulder 07/06/2017   Iron deficiency anemia 05/11/2017   Morbid obesity (HCC) 04/24/2017   Anemia 02/24/2017   Baker's cyst  07/10/2016   Onychomycosis 12/07/2015   Upper airway cough syndrome 03/05/2014   Hypertension associated with diabetes (HCC) 12/05/2006   T2DM (type 2 diabetes mellitus) (HCC) 10/12/2006   Dyslipidemia associated with type 2 diabetes mellitus (HCC) 10/12/2006   Allergic rhinitis 10/12/2006   GERD - Followed by Dr. Russella Dar 10/12/2006   IRRITABLE BOWEL SYNDROME - followed by Dr. Russella Dar 10/12/2006   Peripheral neuropathy 10/11/2006   ALOPECIA NEC 10/11/2006   Past Medical History:  Diagnosis Date   Alopecia    Anemia, mild    Arthritis    Chronic cough    sees pulmonologist   Chronic pain    chest wall and abd - s/p extensive eval   Diabetes mellitus with neuropathy (HCC)    sees endocrine   Diverticulosis    Dyspnea    Fatty liver    GERD (gastroesophageal reflux disease)    takes Nexium bid, hx erosive esophagitis   Headache(784.0)    occasionally;r/t sinus    History of colon polyps    HTN (hypertension)    Hx of amputation of lesser toe (HCC)    sees podiatrist   Hyperlipemia    IBS (irritable bowel syndrome)    Insomnia    takes Elavil nightly   Joint pain    Neuropathy    Neuropathy    Osteomyelitis (HCC)    Pneumonia    89/2/22- patient denies   PONV (postoperative nausea and vomiting)    medication did not help with surgery on 03/28/20   Seasonal allergies    takes Zyrtec daily   Sinus tachycardia     Family History  Problem Relation Age of Onset   Lung cancer Mother 34       smoked heavily   Emphysema Mother    Hypertension Father    Hyperlipidemia Father    Diabetes Father    Coronary artery disease Father    Dementia Father    COPD Father        smoked   Stomach cancer Paternal Aunt    Brain cancer Paternal Uncle    Irritable bowel syndrome Other        Several family members on fathers side    Diabetes Other    Stomach cancer Maternal Aunt    Lung cancer Paternal Uncle    Heart disease Paternal Uncle    Colon cancer Neg Hx     Past Surgical  History:  Procedure Laterality Date   AMPUTATION  12/28/2011   Procedure: AMPUTATION DIGIT;  Surgeon: Nadara Mustard, MD;  Location: [facility] OR;  Service: Orthopedics;  Laterality: Right;  Right Foot 2nd Toe Amputation at MTP (metatarsophalangeal joint)   AMPUTATION Right 04/25/2012   Procedure: Right Foot 3rd Toe Amputation;  Surgeon: Nadara Mustard, MD;  Location: [facility] OR;  Service: Orthopedics;  Laterality: Right;  Right Foot Third Toe Amputation    AMPUTATION Right 07/27/2012   Procedure: Right 4th Toe Amputation at Metatarsophalangeal;  Surgeon: Nadara Mustard, MD;  Location: [facility] OR;  Service: Orthopedics;  Laterality: Right;  Right 4th Toe Amputation at Metatarsophalangeal   AMPUTATION Left 07/15/2016   Procedure: Left 2nd Ray Amputation;  Surgeon: Nadara Mustard, MD;  Location: [facility] OR;  Service: Orthopedics;  Laterality: Left;   AMPUTATION Left 11/18/2016   Procedure: Left 3rd and 4th Ray Amputation;  Surgeon: Nadara Mustard, MD;  Location: [facility] OR;  Service: Orthopedics;  Laterality: Left;   AMPUTATION Right 03/29/2017   Procedure: RIGHT FOOT 3RD AND 4TH RAY AMPUTATION;  Surgeon: Nadara Mustard, MD;  Location: [facility];  Service: Orthopedics;  Laterality: Right;   AMPUTATION Left 08/12/2020   Procedure: LEFT TRANSMETATARSAL AMPUTATION;  Surgeon: Nadara Mustard, MD;  Location: [facility] OR;  Service: Orthopedics;  Laterality: Left;   APPLICATION OF WOUND VAC Left 08/12/2020   Procedure: APPLICATION OF WOUND VAC;  Surgeon: Nadara Mustard, MD;  Location: [facility] OR;  Service: Orthopedics;  Laterality: Left;   COLONOSCOPY     LAPAROSCOPIC APPENDECTOMY  01/05/2011   Procedure: APPENDECTOMY LAPAROSCOPIC;  Surgeon: Valarie Merino, MD;  Location: [facility];  Service: General;  Laterality: N/A;   OOPHORECTOMY  2001   ROTATOR CUFF REPAIR Right    x 2   TUBAL LIGATION     VAGINAL HYSTERECTOMY  2001   Social History   Occupational History   Occupation: Retired   Tobacco Use   Smoking status: Never   Smokeless tobacco: Never   Vaping Use   Vaping status: Never Used  Substance and Sexual Activity   Alcohol use: Never   Drug use: Never   Sexual activity: Yes    Birth control/protection: Surgical

## 2022-08-30 ENCOUNTER — Encounter: Payer: Self-pay | Admitting: Gastroenterology

## 2022-08-30 ENCOUNTER — Ambulatory Visit (INDEPENDENT_AMBULATORY_CARE_PROVIDER_SITE_OTHER): Payer: Medicare Other | Admitting: Gastroenterology

## 2022-08-30 VITALS — BP 118/60 | HR 68 | Ht 66.0 in | Wt 194.0 lb

## 2022-08-30 DIAGNOSIS — D509 Iron deficiency anemia, unspecified: Secondary | ICD-10-CM | POA: Diagnosis not present

## 2022-08-30 DIAGNOSIS — K5900 Constipation, unspecified: Secondary | ICD-10-CM | POA: Diagnosis not present

## 2022-08-30 DIAGNOSIS — K319 Disease of stomach and duodenum, unspecified: Secondary | ICD-10-CM

## 2022-08-30 DIAGNOSIS — R1031 Right lower quadrant pain: Secondary | ICD-10-CM

## 2022-08-30 DIAGNOSIS — K219 Gastro-esophageal reflux disease without esophagitis: Secondary | ICD-10-CM

## 2022-08-30 DIAGNOSIS — Z86010 Personal history of colonic polyps: Secondary | ICD-10-CM

## 2022-08-30 DIAGNOSIS — Z860101 Personal history of adenomatous and serrated colon polyps: Secondary | ICD-10-CM

## 2022-08-30 MED ORDER — NA SULFATE-K SULFATE-MG SULF 17.5-3.13-1.6 GM/177ML PO SOLN: 1.0000 | Freq: Once | ORAL | 0 refills | Status: AC

## 2022-08-30 NOTE — Patient Instructions (Signed)
Increase your Miralax to 2-3 x daily. You can use Ex-lax as needed leading up to your colonoscopy.  Make sure your bowels are regular leading up to your colonoscopy.   You have been scheduled for an endoscopy and colonoscopy. Please follow the written instructions given to you at your visit today.  Please pick up your prep supplies at the pharmacy within the next 1-3 days.  If you use inhalers (even only as needed), please bring them with you on the day of your procedure.  DO NOT TAKE 7 DAYS PRIOR TO TEST- Trulicity (dulaglutide) Ozempic, Wegovy (semaglutide) Mounjaro (tirzepatide) Bydureon Bcise (exanatide extended release)  DO NOT TAKE 1 DAY PRIOR TO YOUR TEST Rybelsus (semaglutide) Adlyxin (lixisenatide) Victoza (liraglutide) Byetta (exanatide) ___________________________________________________________________________  The [facility] providers would like to encourage you to use [facility] to communicate with providers for non-urgent requests or questions.  Due to long hold times on the telephone, sending your provider a message by [facility] may be a faster and more efficient way to get a response.  Please allow 48 business hours for a response.  Please remember that this is for non-urgent requests.   Due to recent changes in healthcare laws, you may see the results of your imaging and laboratory studies on MyChart before your provider has had a chance to review them.  We understand that in some cases there may be results that are confusing or concerning to you. Not all laboratory results come back in the same time frame and the provider may be waiting for multiple results in order to interpret others.  Please give [place] 48 hours in order for your provider to thoroughly review all the results before contacting the office for clarification of your results.   Thank you for choosing me and [facility].  Venita Lick. Pleas Koch., MD., [facility] Graham

## 2022-08-30 NOTE — Progress Notes (Signed)
    Assessment     Iron deficiency anemia, possibly secondary to #2. R/O AVMs Reactive gastropathy GERD Increased constipation, RLQ pain since beginning Mounjaro Personal history of adenomatous colon polyps   Recommendations    Schedule colonoscopy and EGD. The risks (including bleeding, perforation, infection, missed lesions, medication reactions and possible hospitalization or surgery if complications occur), benefits, and alternatives to colonoscopy with possible biopsy and possible polypectomy were discussed with the patient and they consent to proceed.  The risks (including bleeding, perforation, infection, missed lesions, medication reactions and possible hospitalization or surgery if complications occur), benefits, and alternatives to endoscopy with possible biopsy and possible dilation were discussed with the patient and they consent to proceed.   Schedule VCE if no clear source of iron deficiency is determined. Continue omeprazole 40 mg daily and follow antireflux measures 4.   Increase MiraLAX to bid or tid    HPI    This is a 77 year old female with a history of iron deficiency anemia.  She is followed by Dr. Truett Perna and has received intermittent iron infusions.  Reactive gastropathy was noted on EGD and was the only finding noted to explain IDA.  She relates worsening problems with constipation since beginning Mounjaro several months ago.  She also notes mild right lower quadrant pain that lasts a few hours and generally occurs about 2 days after taking Mounjaro.  Her reflux symptoms are under good control. Denies weight loss, diarrhea, change in stool caliber, melena, hematochezia, nausea, vomiting, dysphagia, reflux symptoms, chest pain.   Labs / Imaging       Latest Ref Rng & Units 11/05/2021    9:13 AM 09/08/2020    2:42 PM 05/29/2020   11:49 AM  Hepatic Function  Total Protein 6.0 - 8.3 g/dL 7.1  7.5  7.4   Albumin 3.5 - 5.2 g/dL 3.5 - 5.2 g/dL 4.1    4.1  3.8  3.4    AST 0 - 37 U/L 15  11  13    ALT 0 - 35 U/L 11  6  10    Alk Phosphatase 39 - 117 U/L 40  41  48   Total Bilirubin 0.2 - 1.2 mg/dL 0.4  0.3  0.8        Latest Ref Rng & Units 06/16/2022    8:19 AM 12/08/2021    7:59 AM 11/05/2021    9:13 AM  CBC  WBC 4.0 - 10.5 K/uL 12.6  13.1  11.7   Hemoglobin 12.0 - 15.0 g/dL 96.2  95.2  84.1   Hematocrit 36.0 - 46.0 % 35.1  37.6  35.9   Platelets 150 - 400 K/uL 425  368  364.0    Current Medications, Allergies, Past Medical History, Past Surgical History, Family History and Social History were reviewed in [employer] record.   Physical Exam: General: Well developed, well nourished, no acute distress Head: Normocephalic and atraumatic Eyes: Sclerae anicteric, EOMI Ears: Normal auditory acuity Mouth: No deformities or lesions noted Lungs: Clear throughout to auscultation Heart: Regular rate and rhythm; No murmurs, rubs or bruits Abdomen: Soft, non tender and non distended. No masses, hepatosplenomegaly or hernias noted. Normal Bowel sounds Rectal: Deferred to colonoscopy Musculoskeletal: Symmetrical with no gross deformities  Pulses:  Normal pulses noted Extremities: No edema or deformities noted Neurological: Alert oriented x 4, grossly nonfocal Psychological:  Alert and cooperative. Normal mood and affect   Robel Wuertz T. Russella Dar, MD 08/30/2022, 9:25 AM

## 2022-08-31 ENCOUNTER — Other Ambulatory Visit: Payer: Self-pay | Admitting: Internal Medicine

## 2022-08-31 DIAGNOSIS — Z794 Long term (current) use of insulin: Secondary | ICD-10-CM

## 2022-09-08 ENCOUNTER — Encounter: Payer: Self-pay | Admitting: Orthopedic Surgery

## 2022-09-09 ENCOUNTER — Encounter: Payer: Self-pay | Admitting: Family

## 2022-09-09 ENCOUNTER — Ambulatory Visit (INDEPENDENT_AMBULATORY_CARE_PROVIDER_SITE_OTHER): Payer: Medicare Other | Admitting: Family

## 2022-09-09 DIAGNOSIS — L97511 Non-pressure chronic ulcer of other part of right foot limited to breakdown of skin: Secondary | ICD-10-CM

## 2022-09-09 MED ORDER — CEPHALEXIN 500 MG PO CAPS: 500.0000 mg | ORAL_CAPSULE | Freq: Three times a day (TID) | ORAL | 0 refills | Status: DC

## 2022-09-09 NOTE — Progress Notes (Signed)
Office Visit Note   [name]           Date of Birth: 17-May-1945           MRN: [id number] Visit Date: 09/09/2022              Requested by: Ardith Dark, MD [address] [place],  [place] [address] PCP: Ardith Dark, MD  Chief Complaint  Patient presents with   Right Foot - Follow-up      HPI: Patient is a 77 year old woman who presents in follow-up for [facility] grade 1 ulcer right foot and callus transmetatarsal amputation on the left.  Today presents as a work in concern for foul odor from the plantar ulcer on the right.  Denies purulence or fever or chills  Assessment & Plan: Visit Diagnoses:  No diagnosis found.   Plan: Ulcer debrided on the right foot.  There is no purulence or cellulitis.  Due to foul odor placed on course of Keflex.  Given new felt relieving donuts.  Follow-Up Instructions: No follow-ups on file.   Ortho Exam  Patient is alert, oriented, no adenopathy, well-dressed, normal affect, normal respiratory effort.  On the right foot there is a Wagner grade 1 ulcer.   After informed consent a 10 blade knife was used to debride the skin and soft tissue back to healthy viable granulation tissue.  This measures 1 cm diameter 3 mm deep with healthy granulation tissue without exposed bone or tendon.  Imaging: No results found. No images are attached to the encounter.  Labs: Lab Results  Component Value Date   HGBA1C 8.4 (A) 03/02/2022   HGBA1C 9.0 (A) 09/09/2021   HGBA1C 7.8 (A) 05/06/2021   ESRSEDRATE 51 (H) 12/24/2019   ESRSEDRATE 36 (H) 02/24/2017   CRP 13.2 (H) 12/24/2019   CRP 1.2 (H) 02/24/2017   LABURIC 5.5 09/01/2015   REPTSTATUS 06/03/2020 FINAL 05/29/2020   CULT  05/29/2020    NO GROWTH 5 DAYS Performed at [facility], [address] [place], [place] [address]    LABORGA GROUP B STREP (S.AGALACTIAE) ISOLATED 12/26/2014     Lab Results  Component Value Date   ALBUMIN 4.1 11/05/2021   ALBUMIN 4.1 11/05/2021    ALBUMIN 3.8 09/08/2020   PREALBUMIN 19.4 03/05/2012    No results found for: "MG" No results found for: "VD25OH"  Lab Results  Component Value Date   PREALBUMIN 19.4 03/05/2012      Latest Ref Rng & Units 06/16/2022    8:19 AM 12/08/2021    7:59 AM 11/05/2021    9:13 AM  CBC EXTENDED  WBC 4.0 - 10.5 K/uL 12.6  13.1  11.7   RBC 3.87 - 5.11 MIL/uL 4.07  4.21  4.13   Hemoglobin 12.0 - 15.0 g/dL 40.3  47.4  25.9   HCT 36.0 - 46.0 % 35.1  37.6  35.9   Platelets 150 - 400 K/uL 425  368  364.0   NEUT# 1.7 - 7.7 K/uL 8.5  8.6    Lymph# 0.7 - 4.0 K/uL 3.0  3.4       There is no height or weight on file to calculate BMI.  Orders:  No orders of the defined types were placed in this encounter.  Meds ordered this encounter  Medications   cephALEXin (KEFLEX) 500 MG capsule    Sig: Take 1 capsule (500 mg total) by mouth 3 (three) times daily.    Dispense:  30 capsule  Refill:  0     Procedures: No procedures performed  Clinical Data: No additional findings.  ROS:  All other systems negative, except as noted in the HPI. Review of Systems  Objective: Vital Signs: There were no vitals taken for this visit.  Specialty Comments:  No specialty comments available.  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) 10/23/2019   Nuclear sclerotic cataract of both eyes 10/23/2019   Posterior vitreous detachment of right eye 10/23/2019   Retinal hemorrhage of left eye 10/23/2019   Impingement syndrome of right shoulder 07/06/2017   Iron deficiency anemia 05/11/2017   Morbid obesity (HCC) 04/24/2017   Anemia 02/24/2017   Baker's  cyst 07/10/2016   Onychomycosis 12/07/2015   Upper airway cough syndrome 03/05/2014   Hypertension associated with diabetes (HCC) 12/05/2006   T2DM (type 2 diabetes mellitus) (HCC) 10/12/2006   Dyslipidemia associated with type 2 diabetes mellitus (HCC) 10/12/2006   Allergic rhinitis 10/12/2006   GERD - Followed by Dr. Russella Dar 10/12/2006   IRRITABLE BOWEL SYNDROME - followed by Dr. Russella Dar 10/12/2006   Peripheral neuropathy 10/11/2006   ALOPECIA NEC 10/11/2006   Past Medical History:  Diagnosis Date   Alopecia    Anemia, mild    Arthritis    Chronic cough    sees pulmonologist   Chronic pain    chest wall and abd - s/p extensive eval   Diabetes mellitus with neuropathy (HCC)    sees endocrine   Diverticulosis    Dyspnea    Fatty liver    GERD (gastroesophageal reflux disease)    takes Nexium bid, hx erosive esophagitis   Headache(784.0)    occasionally;r/t sinus    History of colon polyps    HTN (hypertension)    Hx of amputation of lesser toe (HCC)    sees podiatrist   Hyperlipemia    IBS (irritable bowel syndrome)    Insomnia    takes Elavil nightly   Joint pain    Neuropathy    Neuropathy    Osteomyelitis (HCC)    Pneumonia    89/2/22- patient denies   PONV (postoperative nausea and vomiting)    medication did not help with surgery on 03/28/20   Seasonal allergies    takes Zyrtec daily   Sinus tachycardia     Family History  Problem Relation Age of Onset   Lung cancer Mother 83       smoked heavily   Emphysema Mother    Hypertension Father    Hyperlipidemia Father    Diabetes Father    Coronary artery disease Father    Dementia Father    COPD Father        smoked   Stomach cancer Paternal Aunt    Brain cancer Paternal Uncle    Irritable bowel syndrome Other        Several family members on fathers side    Diabetes Other    Stomach cancer Maternal Aunt    Lung cancer Paternal Uncle    Heart disease Paternal Uncle    Colon cancer Neg Hx     Past  Surgical History:  Procedure Laterality Date  AMPUTATION  12/28/2011   Procedure: AMPUTATION DIGIT;  Surgeon: Nadara Mustard, MD;  Location: [facility] OR;  Service: Orthopedics;  Laterality: Right;  Right Foot 2nd Toe Amputation at MTP (metatarsophalangeal joint)   AMPUTATION Right 04/25/2012   Procedure: Right Foot 3rd Toe Amputation;  Surgeon: Nadara Mustard, MD;  Location: [facility] OR;  Service: Orthopedics;  Laterality: Right;  Right Foot Third Toe Amputation    AMPUTATION Right 07/27/2012   Procedure: Right 4th Toe Amputation at Metatarsophalangeal;  Surgeon: Nadara Mustard, MD;  Location: [facility] OR;  Service: Orthopedics;  Laterality: Right;  Right 4th Toe Amputation at Metatarsophalangeal   AMPUTATION Left 07/15/2016   Procedure: Left 2nd Ray Amputation;  Surgeon: Nadara Mustard, MD;  Location: [facility] OR;  Service: Orthopedics;  Laterality: Left;   AMPUTATION Left 11/18/2016   Procedure: Left 3rd and 4th Ray Amputation;  Surgeon: Nadara Mustard, MD;  Location: [facility] OR;  Service: Orthopedics;  Laterality: Left;   AMPUTATION Right 03/29/2017   Procedure: RIGHT FOOT 3RD AND 4TH RAY AMPUTATION;  Surgeon: Nadara Mustard, MD;  Location: [facility] OR;  Service: Orthopedics;  Laterality: Right;   AMPUTATION Left 08/12/2020   Procedure: LEFT TRANSMETATARSAL AMPUTATION;  Surgeon: Nadara Mustard, MD;  Location: [facility] OR;  Service: Orthopedics;  Laterality: Left;   APPLICATION OF WOUND VAC Left 08/12/2020   Procedure: APPLICATION OF WOUND VAC;  Surgeon: Nadara Mustard, MD;  Location: [facility] OR;  Service: Orthopedics;  Laterality: Left;   COLONOSCOPY     LAPAROSCOPIC APPENDECTOMY  01/05/2011   Procedure: APPENDECTOMY LAPAROSCOPIC;  Surgeon: Valarie Merino, MD;  Location: [facility] ORS;  Service: General;  Laterality: N/A;   OOPHORECTOMY  2001   ROTATOR CUFF REPAIR Right    x 2   TUBAL LIGATION     VAGINAL HYSTERECTOMY  2001   Social History   Occupational History   Occupation: Retired   Tobacco Use   Smoking status: Never   Smokeless tobacco:  Never  Vaping Use   Vaping status: Never Used  Substance and Sexual Activity   Alcohol use: Never   Drug use: Never   Sexual activity: Yes    Birth control/protection: Surgical

## 2022-09-13 MED ORDER — CEPHALEXIN 500 MG PO CAPS: 500.0000 mg | ORAL_CAPSULE | Freq: Three times a day (TID) | ORAL | 0 refills | Status: DC

## 2022-09-15 ENCOUNTER — Ambulatory Visit: Payer: Medicare Other | Admitting: Orthopedic Surgery

## 2022-09-16 ENCOUNTER — Encounter: Payer: Self-pay | Admitting: Nurse Practitioner

## 2022-09-16 ENCOUNTER — Telehealth: Payer: Self-pay

## 2022-09-16 ENCOUNTER — Inpatient Hospital Stay (HOSPITAL_BASED_OUTPATIENT_CLINIC_OR_DEPARTMENT_OTHER): Payer: Medicare Other | Admitting: Nurse Practitioner

## 2022-09-16 ENCOUNTER — Inpatient Hospital Stay: Payer: Medicare Other | Attending: Nurse Practitioner

## 2022-09-16 VITALS — BP 120/58 | HR 72 | Temp 97.9°F | Resp 18 | Ht 66.0 in | Wt 189.3 lb

## 2022-09-16 DIAGNOSIS — D509 Iron deficiency anemia, unspecified: Secondary | ICD-10-CM | POA: Diagnosis not present

## 2022-09-16 LAB — CBC WITH DIFFERENTIAL (CANCER CENTER ONLY)
Abs Immature Granulocytes: 0.02 10*3/uL (ref 0.00–0.07)
Basophils Absolute: 0.1 10*3/uL (ref 0.0–0.1)
Basophils Relative: 1 %
Eosinophils Absolute: 0.3 10*3/uL (ref 0.0–0.5)
Eosinophils Relative: 3 %
HCT: 35.7 % — ABNORMAL LOW (ref 36.0–46.0)
Hemoglobin: 11.9 g/dL — ABNORMAL LOW (ref 12.0–15.0)
Immature Granulocytes: 0 %
Lymphocytes Relative: 23 %
Lymphs Abs: 2.9 10*3/uL (ref 0.7–4.0)
MCH: 29 pg (ref 26.0–34.0)
MCHC: 33.3 g/dL (ref 30.0–36.0)
MCV: 86.9 fL (ref 80.0–100.0)
Monocytes Absolute: 0.8 10*3/uL (ref 0.1–1.0)
Monocytes Relative: 6 %
Neutro Abs: 8.3 10*3/uL — ABNORMAL HIGH (ref 1.7–7.7)
Neutrophils Relative %: 67 %
Platelet Count: 400 10*3/uL (ref 150–400)
RBC: 4.11 MIL/uL (ref 3.87–5.11)
RDW: 12.5 % (ref 11.5–15.5)
WBC Count: 12.4 10*3/uL — ABNORMAL HIGH (ref 4.0–10.5)
nRBC: 0 % (ref 0.0–0.2)

## 2022-09-16 LAB — FERRITIN: Ferritin: 185 ng/mL (ref 11–307)

## 2022-09-16 NOTE — Progress Notes (Signed)
   [facility] OFFICE PROGRESS NOTE   Diagnosis: Iron deficiency anemia  INTERVAL HISTORY:   Beth Holden returns as scheduled.  She continues oral iron.  She feels she is tolerating the iron well.  No change in baseline constipation.  No nausea or vomiting.  She denies bleeding.  She has intermittent pain at the right mid to low abdomen since beginning [facility] in March of this year.  The pain typically begins about 2 days after taking Mounjaro and then occurs intermittently.    Objective:  Vital signs in last 24 hours:  Blood pressure (!) 120/58, pulse 72, temperature 97.9 F (36.6 C), temperature source Oral, resp. rate 18, height 5\' 6"  (1.676 m), weight 189 lb 4.8 oz (85.9 kg), SpO2 100%.    Resp: Lungs clear bilaterally. Cardio: Regular rate and rhythm. GI: Abdomen soft and nontender.  No hepatosplenomegaly. Vascular: No leg edema.   Lab Results:  Lab Results  Component Value Date   WBC 12.4 (H) 09/16/2022   HGB 11.9 (L) 09/16/2022   HCT 35.7 (L) 09/16/2022   MCV 86.9 09/16/2022   PLT 400 09/16/2022   NEUTROABS 8.3 (H) 09/16/2022    Imaging:  No results found.  Medications: I have reviewed the patient's current medications.  Assessment/Plan: Anemia secondary to iron deficiency, possible GI blood loss related to gastritis status post IV ferrous gluconate weekly x4 beginning 03/13/2017, single dose of IV ferrous gluconate 05/22/2017.  Hemoglobin/MCV corrected into normal range. Feraheme 03/29/2021, 04/15/2021 Upper endoscopy 02/15/2017-diffuse moderate inflammation characterized by congestion, erythema, friability and granularity in the gastric body, posterior wall of the stomach and gastric antrum.  Biopsy GASTRIC ANTRAL MUCOSA WITH NON-SPECIFIC REACTIVE GASTROPATHY AND FEW SUBEPITHELIAL CRYSTALLINE IRON DEPOSITS, CONSISTENT WITH IRON PILL GASTROPATHY. GASTRIC OXYNTIC MUCOSA WITH PARIETAL CELL HYPERPLASIA AS CAN BE SEEN IN HYPERGASTRINEMIC STATES SUCH AS PPI  THERAPY. WARTHIN-STARRY STAIN IS NEGATIVE FOR HELICOBACTER PYLORI Colonoscopy 02/15/2017-6 mm polyp in the transverse colon (TUBULAR ADENOMA. NEGATIVE FOR HIGH GRADE DYSPLASIA OR MALIGNANCY) Diabetes Hypertension Mild neutrophilia- chronic, white count dating at least to 2012 on review of epic chart Small hemoglobin on repeat urinalyses-seen by urology.  Disposition: Beth Holden remains stable from a hematologic standpoint.  Stable mild anemia, unchanged as compared to a year ago.  We will follow-up on the ferritin from today.  She is scheduled for a colonoscopy and upper endoscopy next month with the plan for a capsule endoscopy if no source of blood loss is identified on the other studies.  She has stable mild neutrophilia.  She will return for follow-up and lab in 3 months.    Lonna Cobb ANP/GNP-BC   09/16/2022  8:38 AM

## 2022-09-16 NOTE — Telephone Encounter (Signed)
Patient gave verbal understanding and had no further questions or concerns  

## 2022-09-16 NOTE — Telephone Encounter (Signed)
-----   Message from Lonna Cobb sent at 09/16/2022  9:49 AM EDT ----- Please let her know ferritin is normal, stable.  Follow-up as scheduled.

## 2022-09-21 ENCOUNTER — Encounter: Payer: Self-pay | Admitting: Internal Medicine

## 2022-09-21 ENCOUNTER — Ambulatory Visit: Payer: Medicare Other | Attending: Internal Medicine | Admitting: Internal Medicine

## 2022-09-21 VITALS — BP 126/68 | HR 71 | Ht 66.0 in | Wt 192.4 lb

## 2022-09-21 DIAGNOSIS — E785 Hyperlipidemia, unspecified: Secondary | ICD-10-CM | POA: Diagnosis not present

## 2022-09-21 DIAGNOSIS — I10 Essential (primary) hypertension: Secondary | ICD-10-CM | POA: Diagnosis not present

## 2022-09-21 DIAGNOSIS — E1169 Type 2 diabetes mellitus with other specified complication: Secondary | ICD-10-CM | POA: Diagnosis not present

## 2022-09-21 DIAGNOSIS — Z794 Long term (current) use of insulin: Secondary | ICD-10-CM | POA: Diagnosis not present

## 2022-09-21 DIAGNOSIS — Z79899 Other long term (current) drug therapy: Secondary | ICD-10-CM | POA: Diagnosis present

## 2022-09-21 NOTE — Progress Notes (Signed)
Cardiology Office Note   Date:  09/21/2022   ID:  Manna, Rochette 01-22-1945, MRN [id number]  PCP:  Ardith Dark, MD  Cardiologist:   Dietrich Pates, MD   Pt returns for f/u of HTN and HL   History of Present Illness: Beth Holden is a 77 y.o. female with a history of Diabetes GERD, HTN, HL   I saw her Dec 2017 for the first time for CP   Pain was substernal  Breathing helped   Not associated with activity   Myovue was done which was normal    The patient also complained of some palpitations  Holter monitor showed no signif arrhythm     I recomm a trial of a low dose b blocke With cough I recomm she stop there ACE   I saw the pt in May 2023    She is now on Ozempic and has lost 30 lbs   Has some RLQ pains      A1C was 6.2 when last checked  She denies CP  Breathing is good    No palpitations    Follows with Dr Lajoyce Corners for foot ulcer      Outpatient Medications Prior to Visit  Medication Sig Dispense Refill   acetaminophen (TYLENOL) 500 MG tablet Take 500 mg by mouth every 8 (eight) hours as needed for mild pain.      azelastine (ASTELIN) 0.1 % nasal spray Place 2 sprays into both nostrils 2 (two) times daily. 30 mL 12   betamethasone dipropionate (DIPROLENE) 0.05 % cream Apply 1 application topically 2 (two) times daily as needed (IRRITATION). 30 g 0   Blood Glucose Monitoring Suppl (ONE TOUCH ULTRA 2) w/Device KIT Use to check blood sugar 1 kit 0   Calcium Carb-Cholecalciferol (CALCIUM + VITAMIN D3) 600-10 MG-MCG TABS Take 2 tablets by mouth daily. 90 tablet 3   cephALEXin (KEFLEX) 500 MG capsule Take 1 capsule (500 mg total) by mouth 3 (three) times daily. 30 capsule 0   cetirizine (ZYRTEC) 10 MG tablet Take 1 tablet (10 mg total) by mouth daily. 30 tablet 11   clotrimazole (CLOTRIMAZOLE AF) 1 % cream Apply 1 Application topically 2 (two) times daily. 35.4 g 2   Fluocinolone Acetonide 0.01 % OIL Place 1-2 drops into both ears daily. As needed 20 mL 0   gabapentin  (NEURONTIN) 100 MG capsule TAKE 2 TO 3 CAPSULES AT    BEDTIME 180 capsule 5   glucagon (GLUCAGEN) 1 MG SOLR injection Inject 1 mg into the muscle once as needed for up to 1 dose for low blood sugar. 1 each 11   glucose blood (ONETOUCH ULTRA) test strip USE 1 STRIP TO CHECK GLUCOSE 3-4 TIMES DAILY 400 each 3   insulin degludec (TRESIBA FLEXTOUCH) 200 UNIT/ML FlexTouch Pen Inject 34 Units into the skin daily. 12 mL 3   Insulin Pen Needle 32G X 4 MM MISC Use 1x a day 100 each 3   insulin regular (NOVOLIN R RELION) 100 units/mL injection Inject 0.2-0.3 mLs (20-30 Units total) into the skin 3 (three) times daily before meals. Per sliding scale     Insulin Syringe-Needle U-100 (RELION INSULIN SYRINGE 1ML/31G) 31G X 5/16" 1 ML MISC USE 2 TIMES A DAY 100 each 2   irbesartan-hydrochlorothiazide (AVALIDE) 150-12.5 MG tablet Take 1 tablet by mouth daily. 90 tablet 3   metFORMIN (GLUCOPHAGE) 1000 MG tablet TAKE 1 TABLET TWICE DAILY  WITH MEALS 180 tablet 0   metoprolol  succinate (TOPROL-XL) 25 MG 24 hr tablet Take 1 tablet (25 mg total) by mouth daily. 90 tablet 0   omeprazole (PRILOSEC) 40 MG capsule TAKE 1 CAPSULE TWICE DAILY 180 capsule 3   OVER THE COUNTER MEDICATION Apply 1-3 application topically at bedtime as needed (foot pain). TOPRICIN FOOT CREAM - NEUROPATHY FOOT CREAM **APPLIES TO BOTH FEET AT BEDTIME**     Probiotic Product (PROBIOTIC DAILY PO) Take 1 capsule by mouth daily.     rosuvastatin (CRESTOR) 20 MG tablet TAKE 1 TABLET DAILY 90 tablet 3   silver sulfADIAZINE (SILVADENE) 1 % cream Apply 1 Application topically daily. 50 g 0   tirzepatide (MOUNJARO) 5 MG/0.5ML Pen Inject 5 mg into the skin once a week. 6 mL 0   vitamin B-12 (CYANOCOBALAMIN) 1000 MCG tablet Take 1,000 mcg daily by mouth.     vitamin C (ASCORBIC ACID) 500 MG tablet Take 500 mg by mouth daily.     No facility-administered medications prior to visit.     Allergies:   Doxycycline calcium, Codeine, and Propoxyphene hcl    Past Medical History:  Diagnosis Date   Alopecia    Anemia, mild    Arthritis    Chronic cough    sees pulmonologist   Chronic pain    chest wall and abd - s/p extensive eval   Diabetes mellitus with neuropathy (HCC)    sees endocrine   Diverticulosis    Dyspnea    Fatty liver    GERD (gastroesophageal reflux disease)    takes Nexium bid, hx erosive esophagitis   Headache(784.0)    occasionally;r/t sinus    History of colon polyps    HTN (hypertension)    Hx of amputation of lesser toe (HCC)    sees podiatrist   Hyperlipemia    IBS (irritable bowel syndrome)    Insomnia    takes Elavil nightly   Joint pain    Neuropathy    Neuropathy    Osteomyelitis (HCC)    Pneumonia    89/2/22- patient denies   PONV (postoperative nausea and vomiting)    medication did not help with surgery on 03/28/20   Seasonal allergies    takes Zyrtec daily   Sinus tachycardia     Past Surgical History:  Procedure Laterality Date   AMPUTATION  12/28/2011   Procedure: AMPUTATION DIGIT;  Surgeon: Nadara Mustard, MD;  Location: [facility] OR;  Service: Orthopedics;  Laterality: Right;  Right Foot 2nd Toe Amputation at MTP (metatarsophalangeal joint)   AMPUTATION Right 04/25/2012   Procedure: Right Foot 3rd Toe Amputation;  Surgeon: Nadara Mustard, MD;  Location: [facility] OR;  Service: Orthopedics;  Laterality: Right;  Right Foot Third Toe Amputation    AMPUTATION Right 07/27/2012   Procedure: Right 4th Toe Amputation at Metatarsophalangeal;  Surgeon: Nadara Mustard, MD;  Location: [facility] OR;  Service: Orthopedics;  Laterality: Right;  Right 4th Toe Amputation at Metatarsophalangeal   AMPUTATION Left 07/15/2016   Procedure: Left 2nd Ray Amputation;  Surgeon: Nadara Mustard, MD;  Location: [facility] OR;  Service: Orthopedics;  Laterality: Left;   AMPUTATION Left 11/18/2016   Procedure: Left 3rd and 4th Ray Amputation;  Surgeon: Nadara Mustard, MD;  Location: [facility] OR;  Service: Orthopedics;  Laterality: Left;   AMPUTATION Right  03/29/2017   Procedure: RIGHT FOOT 3RD AND 4TH RAY AMPUTATION;  Surgeon: Nadara Mustard, MD;  Location: [facility] OR;  Service: Orthopedics;  Laterality: Right;   AMPUTATION Left 08/12/2020  Procedure: LEFT TRANSMETATARSAL AMPUTATION;  Surgeon: Nadara Mustard, MD;  Location: [facility] OR;  Service: Orthopedics;  Laterality: Left;   APPLICATION OF WOUND VAC Left 08/12/2020   Procedure: APPLICATION OF WOUND VAC;  Surgeon: Nadara Mustard, MD;  Location: [facility] OR;  Service: Orthopedics;  Laterality: Left;   COLONOSCOPY     LAPAROSCOPIC APPENDECTOMY  01/05/2011   Procedure: APPENDECTOMY LAPAROSCOPIC;  Surgeon: Valarie Merino, MD;  Location: [facility] ORS;  Service: General;  Laterality: N/A;   OOPHORECTOMY  2001   ROTATOR CUFF REPAIR Right    x 2   TUBAL LIGATION     VAGINAL HYSTERECTOMY  2001     Social History:  The patient  reports that she has never smoked. She has never used smokeless tobacco. She reports that she does not drink alcohol and does not use drugs.   Family History:  The patient's family history includes Brain cancer in her paternal uncle; COPD in her father; Coronary artery disease in her father; Dementia in her father; Diabetes in her father and another family member; Emphysema in her mother; Heart disease in her paternal uncle; Hyperlipidemia in her father; Hypertension in her father; Irritable bowel syndrome in an other family member; Lung cancer in her paternal uncle; Lung cancer (age of onset: 92) in her mother; Stomach cancer in her maternal aunt and paternal aunt.    ROS:  Please see the history of present illness. All other systems are reviewed and  Negative to the above problem except as noted.    PHYSICAL EXAM: VS:  BP 126/68 (BP Location: Left Arm, Patient Position: Sitting, Cuff Size: Large)   Pulse 71   Ht 5\' 6"  (1.676 m)   Wt 192 lb 6.4 oz (87.3 kg)   SpO2 95%   BMI 31.05 kg/m   GEN:  Obese 77 yo  in no acute distress  HEENT: normal  Neck: no JVD, no bruit  Cardiac: RRR; no  murmurs  No LE edema    Respiratory:  clear to auscultation GI: soft, nontender, No hepatomegaly  MS: R foot in boot  Otherwise exam defrred      Skin: warm and dry     EKG:  EKG shows SR 71 bpm    Lipid Panel    Component Value Date/Time   CHOL 122 11/05/2021 0913   TRIG 159.0 (H) 11/05/2021 0913   HDL 39.90 11/05/2021 0913   CHOLHDL 3 11/05/2021 0913   VLDL 31.8 11/05/2021 0913   LDLCALC 50 11/05/2021 0913      Wt Readings from Last 3 Encounters:  09/21/22 192 lb 6.4 oz (87.3 kg)  09/16/22 189 lb 4.8 oz (85.9 kg)  08/30/22 194 lb (88 kg)      ASSESSMENT AND PLAN:  1  Hx of dyspnea  Pt denies    2  Hx palpitations  Pt denies     3  Hx Cp    Pt with atherosclerosis on CT scan donw at outside institution  Pt denies CP     Myoview in 2018 showed normal perfusion   LVEF normal    4  HTN  BP is currently well controlled     5  HL   Will check lipomed and ApoB     6  DM  Check A1C    She says it was 6.2  I do not see this result    Follow up 1 year      Current medicines are reviewed at length with  the patient today.  The patient does not have concerns regarding medicines.  Signed, Dietrich Pates, MD  09/21/2022 9:13 AM    [facility] [address] [place], [place], [place]  [address] Phone: [phone]; Fax: [phone]

## 2022-09-21 NOTE — Patient Instructions (Signed)
Medication Instructions:  Your physician recommends that you continue on your current medications as directed. Please refer to the Current Medication list given to you today.  *If you need a refill on your cardiac medications before your next appointment, please call your pharmacy*  Lab Work: TODAY: Lipomed, ApoB, HgbA1C  If you have labs (blood work) drawn today and your tests are completely normal, you will receive your results only by: MyChart Message (if you have MyChart) OR A paper copy in the mail If you have any lab test that is abnormal or we need to change your treatment, we will call you to review the results.  Follow-Up: At [facility], you and your health needs are our priority.  As part of our continuing mission to provide you with exceptional heart care, we have created designated Provider Care Teams.  These Care Teams include your primary Cardiologist (physician) and Advanced Practice Providers (APPs -  Physician Assistants and Nurse Practitioners) who all work together to provide you with the care you need, when you need it.  Your next appointment:   1 year  Provider:   Dietrich Pates, MD

## 2022-09-22 LAB — HEMOGLOBIN A1C
Est. average glucose Bld gHb Est-mCnc: 146 mg/dL
Hgb A1c MFr Bld: 6.7 % — ABNORMAL HIGH (ref 4.8–5.6)

## 2022-09-22 LAB — NMR, LIPOPROFILE
Cholesterol, Total: 122 mg/dL (ref 100–199)
HDL Particle Number: 27.6 umol/L — ABNORMAL LOW (ref >=30.5)
HDL-C: 39 mg/dL — ABNORMAL LOW (ref >39)
LDL Particle Number: 636 nmol/L (ref <1000)
LDL Size: 20.3 nm — ABNORMAL LOW (ref >20.5)
LDL-C (NIH Calc): 56 mg/dL (ref 0–99)
LP-IR Score: 59 — ABNORMAL HIGH (ref <=45)
Small LDL Particle Number: 317 nmol/L (ref <=527)
Triglycerides: 158 mg/dL — ABNORMAL HIGH (ref 0–149)

## 2022-09-22 LAB — APOLIPOPROTEIN B: Apolipoprotein B: 59 mg/dL (ref <90)

## 2022-10-04 ENCOUNTER — Ambulatory Visit (INDEPENDENT_AMBULATORY_CARE_PROVIDER_SITE_OTHER): Payer: Medicare Other | Admitting: Orthopedic Surgery

## 2022-10-04 DIAGNOSIS — L97511 Non-pressure chronic ulcer of other part of right foot limited to breakdown of skin: Secondary | ICD-10-CM | POA: Diagnosis not present

## 2022-10-04 DIAGNOSIS — Z89432 Acquired absence of left foot: Secondary | ICD-10-CM | POA: Diagnosis not present

## 2022-10-09 ENCOUNTER — Encounter: Payer: Self-pay | Admitting: Orthopedic Surgery

## 2022-10-09 NOTE — Progress Notes (Signed)
Office Visit Note   [name]           Date of Birth: 27-Jan-1945           MRN: [id number] Visit Date: 10/04/2022              Requested by: Ardith Dark, MD [address] [place],  [place] [address] PCP: Ardith Dark, MD  Chief Complaint  Patient presents with   Right Foot - Follow-up      HPI: Patient is a 77 year old woman who presents in follow-up for ulceration bilateral lower extremities with left transmetatarsal amputation and persistent ulceration right foot.  She is currently in a postoperative shoe with a felt relieving down and she feels like she is getting better.  Assessment & Plan: Visit Diagnoses:  1. 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HCC)   2. Non-pressure chronic ulcer of other part of right foot limited to breakdown of skin (HCC)     Plan: Callus debrided on the left foot ulcer debrided on the right foot.  She is showing improvement.  Will reevaluate in 4 weeks.  Follow-Up Instructions: Return in about 4 weeks (around 11/01/2022).   Ortho Exam  Patient is alert, oriented, no adenopathy, well-dressed, normal affect, normal respiratory effort. Examination the left transmetatarsal amputation she has hypertrophic callus there is no ischemic changes.  After informed consent a 10 blade knife was used to pare the callus no open wounds or ulcers or cellulitis.  Examination the right foot the ulcer is improving it is flat.  There is no cellulitis no active Charcot changes.  After informed consent a 10 blade knife was used to debride the skin and soft tissue back to healthy viable granulation tissue.  There is no tunneling no exposed bone or tendon.  After debridement the ulcer is 2 cm in diameter.  Patient was provided additional felt relieving donuts to unload pressure.  Imaging: No results found. No images are attached to the encounter.  Labs: Lab Results  Component Value Date   HGBA1C 6.7 (H) 09/21/2022   HGBA1C 8.4 (A)  03/02/2022   HGBA1C 9.0 (A) 09/09/2021   ESRSEDRATE 51 (H) 12/24/2019   ESRSEDRATE 36 (H) 02/24/2017   CRP 13.2 (H) 12/24/2019   CRP 1.2 (H) 02/24/2017   LABURIC 5.5 09/01/2015   REPTSTATUS 06/03/2020 FINAL 05/29/2020   CULT  05/29/2020    NO GROWTH 5 DAYS Performed at [facility], [address] [place], [place] [address]    LABORGA GROUP B STREP (S.AGALACTIAE) ISOLATED 12/26/2014     Lab Results  Component Value Date   ALBUMIN 4.1 11/05/2021   ALBUMIN 4.1 11/05/2021   ALBUMIN 3.8 09/08/2020   PREALBUMIN 19.4 03/05/2012    No results found for: "MG" No results found for: "VD25OH"  Lab Results  Component Value Date   PREALBUMIN 19.4 03/05/2012      Latest Ref Rng & Units 09/16/2022    7:46 AM 06/16/2022    8:19 AM 12/08/2021    7:59 AM  CBC EXTENDED  WBC 4.0 - 10.5 K/uL 12.4  12.6  13.1   RBC 3.87 - 5.11 MIL/uL 4.11  4.07  4.21   Hemoglobin 12.0 - 15.0 g/dL 60.1  09.3  23.5   HCT 36.0 - 46.0 % 35.7  35.1  37.6   Platelets 150 - 400 K/uL 400  425  368   NEUT# 1.7 - 7.7 K/uL 8.3  8.5  8.6  Lymph# 0.7 - 4.0 K/uL 2.9  3.0  3.4      There is no height or weight on file to calculate BMI.  Orders:  No orders of the defined types were placed in this encounter.  No orders of the defined types were placed in this encounter.    Procedures: No procedures performed  Clinical Data: No additional findings.  ROS:  All other systems negative, except as noted in the HPI. Review of Systems  Objective: Vital Signs: There were no vitals taken for this visit.  Specialty Comments:  No specialty comments available.  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) 10/23/2019   Nuclear sclerotic cataract of both eyes 10/23/2019   Posterior vitreous detachment of right eye 10/23/2019   Retinal hemorrhage of left eye 10/23/2019   Impingement syndrome of right shoulder 07/06/2017   Iron deficiency anemia 05/11/2017   Morbid obesity (HCC) 04/24/2017   Anemia 02/24/2017   Baker's cyst 07/10/2016   Onychomycosis 12/07/2015   Upper airway cough syndrome 03/05/2014   Hypertension associated with diabetes (HCC) 12/05/2006   T2DM (type 2 diabetes mellitus) (HCC) 10/12/2006   Dyslipidemia associated with type 2 diabetes mellitus (HCC) 10/12/2006   Allergic rhinitis 10/12/2006   GERD - Followed by Dr. Russella Dar 10/12/2006   IRRITABLE BOWEL SYNDROME - followed by Dr. Russella Dar 10/12/2006   Peripheral neuropathy 10/11/2006   ALOPECIA NEC 10/11/2006   Past Medical History:  Diagnosis Date   Alopecia    Anemia, mild    Arthritis    Chronic cough    sees pulmonologist   Chronic pain    chest wall and abd - s/p extensive eval   Diabetes mellitus with neuropathy (HCC)    sees endocrine   Diverticulosis    Dyspnea    Fatty liver    GERD (gastroesophageal reflux disease)    takes Nexium bid, hx erosive esophagitis   Headache(784.0)    occasionally;r/t sinus    History of colon polyps    HTN (hypertension)    Hx of amputation of lesser toe (HCC)    sees podiatrist   Hyperlipemia    IBS (irritable bowel syndrome)    Insomnia    takes Elavil nightly   Joint pain    Neuropathy    Neuropathy    Osteomyelitis (HCC)    Pneumonia    89/2/22- patient denies   PONV (postoperative nausea and vomiting)    medication did not help with surgery on 03/28/20   Seasonal allergies    takes Zyrtec daily   Sinus tachycardia     Family History  Problem Relation Age of Onset   Lung cancer Mother 73       smoked heavily   Emphysema Mother    Hypertension Father    Hyperlipidemia Father    Diabetes Father     Coronary artery disease Father    Dementia Father    COPD Father        smoked   Stomach cancer Paternal Aunt    Brain cancer Paternal Uncle    Irritable bowel syndrome Other        Several family  members on fathers side    Diabetes Other    Stomach cancer Maternal Aunt    Lung cancer Paternal Uncle    Heart disease Paternal Uncle    Colon cancer Neg Hx     Past Surgical History:  Procedure Laterality Date   AMPUTATION  12/28/2011   Procedure: AMPUTATION DIGIT;  Surgeon: Nadara Mustard, MD;  Location: [facility] OR;  Service: Orthopedics;  Laterality: Right;  Right Foot 2nd Toe Amputation at MTP (metatarsophalangeal joint)   AMPUTATION Right 04/25/2012   Procedure: Right Foot 3rd Toe Amputation;  Surgeon: Nadara Mustard, MD;  Location: [facility] OR;  Service: Orthopedics;  Laterality: Right;  Right Foot Third Toe Amputation    AMPUTATION Right 07/27/2012   Procedure: Right 4th Toe Amputation at Metatarsophalangeal;  Surgeon: Nadara Mustard, MD;  Location: [facility] OR;  Service: Orthopedics;  Laterality: Right;  Right 4th Toe Amputation at Metatarsophalangeal   AMPUTATION Left 07/15/2016   Procedure: Left 2nd Ray Amputation;  Surgeon: Nadara Mustard, MD;  Location: [facility] OR;  Service: Orthopedics;  Laterality: Left;   AMPUTATION Left 11/18/2016   Procedure: Left 3rd and 4th Ray Amputation;  Surgeon: Nadara Mustard, MD;  Location: [facility] OR;  Service: Orthopedics;  Laterality: Left;   AMPUTATION Right 03/29/2017   Procedure: RIGHT FOOT 3RD AND 4TH RAY AMPUTATION;  Surgeon: Nadara Mustard, MD;  Location: [facility] OR;  Service: Orthopedics;  Laterality: Right;   AMPUTATION Left 08/12/2020   Procedure: LEFT TRANSMETATARSAL AMPUTATION;  Surgeon: Nadara Mustard, MD;  Location: [facility] OR;  Service: Orthopedics;  Laterality: Left;   APPLICATION OF WOUND VAC Left 08/12/2020   Procedure: APPLICATION OF WOUND VAC;  Surgeon: Nadara Mustard, MD;  Location: [facility] OR;  Service: Orthopedics;  Laterality: Left;   COLONOSCOPY     LAPAROSCOPIC APPENDECTOMY   01/05/2011   Procedure: APPENDECTOMY LAPAROSCOPIC;  Surgeon: Valarie Merino, MD;  Location: [facility] ORS;  Service: General;  Laterality: N/A;   OOPHORECTOMY  2001   ROTATOR CUFF REPAIR Right    x 2   TUBAL LIGATION     VAGINAL HYSTERECTOMY  2001   Social History   Occupational History   Occupation: Retired   Tobacco Use   Smoking status: Never   Smokeless tobacco: Never  Vaping Use   Vaping status: Never Used  Substance and Sexual Activity   Alcohol use: Never   Drug use: Never   Sexual activity: Yes    Birth control/protection: Surgical

## 2022-10-10 ENCOUNTER — Other Ambulatory Visit: Payer: Self-pay | Admitting: Internal Medicine

## 2022-10-10 DIAGNOSIS — E1159 Type 2 diabetes mellitus with other circulatory complications: Secondary | ICD-10-CM

## 2022-10-10 NOTE — Telephone Encounter (Signed)
Metformin RX refill request complete

## 2022-10-15 ENCOUNTER — Encounter: Payer: Self-pay | Admitting: Family Medicine

## 2022-10-17 ENCOUNTER — Other Ambulatory Visit: Payer: Self-pay

## 2022-10-17 MED ORDER — IRBESARTAN-HYDROCHLOROTHIAZIDE 150-12.5 MG PO TABS: 1.0000 | ORAL_TABLET | Freq: Every day | ORAL | 3 refills | Status: DC

## 2022-10-20 ENCOUNTER — Encounter: Payer: Self-pay | Admitting: Nurse Practitioner

## 2022-10-31 ENCOUNTER — Ambulatory Visit: Payer: Medicare Other | Admitting: Orthopedic Surgery

## 2022-11-01 ENCOUNTER — Ambulatory Visit: Payer: Medicare Other | Admitting: Gastroenterology

## 2022-11-01 ENCOUNTER — Encounter: Payer: Self-pay | Admitting: Gastroenterology

## 2022-11-01 VITALS — BP 130/77 | HR 77 | Temp 98.0°F | Resp 14 | Ht 66.0 in | Wt 192.0 lb

## 2022-11-01 DIAGNOSIS — Z860101 Personal history of adenomatous and serrated colon polyps: Secondary | ICD-10-CM

## 2022-11-01 DIAGNOSIS — Z09 Encounter for follow-up examination after completed treatment for conditions other than malignant neoplasm: Secondary | ICD-10-CM

## 2022-11-01 DIAGNOSIS — Z1211 Encounter for screening for malignant neoplasm of colon: Secondary | ICD-10-CM | POA: Diagnosis not present

## 2022-11-01 DIAGNOSIS — K219 Gastro-esophageal reflux disease without esophagitis: Secondary | ICD-10-CM | POA: Diagnosis not present

## 2022-11-01 DIAGNOSIS — D509 Iron deficiency anemia, unspecified: Secondary | ICD-10-CM | POA: Diagnosis not present

## 2022-11-01 DIAGNOSIS — I10 Essential (primary) hypertension: Secondary | ICD-10-CM | POA: Diagnosis not present

## 2022-11-01 MED ORDER — SODIUM CHLORIDE 0.9 % IV SOLN: 500.0000 mL | INTRAVENOUS | Status: DC

## 2022-11-01 NOTE — Progress Notes (Signed)
Called to room to assist during endoscopic procedure.  Patient ID and intended procedure confirmed with present staff. Received instructions for my participation in the procedure from the performing physician.  

## 2022-11-01 NOTE — Progress Notes (Signed)
Sedate, gd SR, tolerated procedure well, VSS, report to RN 

## 2022-11-01 NOTE — Progress Notes (Signed)
History & Physical  Primary Care Physician:  Ardith Dark, MD Primary Gastroenterologist: Claudette Head, MD  Impression / Plan:  Iron deficiency anemia and personal history of adenomatous colon polyps for colonoscopy and EGD.  CHIEF COMPLAINT:  IDA, Personal history of colon polyps   HPI: Beth Holden is a 77 y.o. female with a history of iron deficiency anemia and personal history of adenomatous colon polyps for colonoscopy and EGD.    Past Medical History:  Diagnosis Date   Alopecia    Anemia, mild    Arthritis    Chronic cough    sees pulmonologist   Chronic pain    chest wall and abd - s/p extensive eval   Diabetes mellitus with neuropathy (HCC)    sees endocrine   Diverticulosis    Dyspnea    Fatty liver    GERD (gastroesophageal reflux disease)    takes Nexium bid, hx erosive esophagitis   Headache(784.0)    occasionally;r/t sinus    History of colon polyps    HTN (hypertension)    Hx of amputation of lesser toe (HCC)    sees podiatrist   Hyperlipemia    IBS (irritable bowel syndrome)    Insomnia    takes Elavil nightly   Joint pain    Neuropathy    Neuropathy    Osteomyelitis (HCC)    Pneumonia    89/2/22- patient denies   PONV (postoperative nausea and vomiting)    medication did not help with surgery on 03/28/20   Seasonal allergies    takes Zyrtec daily   Sinus tachycardia     Past Surgical History:  Procedure Laterality Date   AMPUTATION  12/28/2011   Procedure: AMPUTATION DIGIT;  Surgeon: Nadara Mustard, MD;  Location: [facility] OR;  Service: Orthopedics;  Laterality: Right;  Right Foot 2nd Toe Amputation at MTP (metatarsophalangeal joint)   AMPUTATION Right 04/25/2012   Procedure: Right Foot 3rd Toe Amputation;  Surgeon: Nadara Mustard, MD;  Location: [facility] OR;  Service: Orthopedics;  Laterality: Right;  Right Foot Third Toe Amputation    AMPUTATION Right 07/27/2012   Procedure: Right 4th Toe Amputation at Metatarsophalangeal;  Surgeon: Nadara Mustard, MD;  Location: [facility] OR;  Service: Orthopedics;  Laterality: Right;  Right 4th Toe Amputation at Metatarsophalangeal   AMPUTATION Left 07/15/2016   Procedure: Left 2nd Ray Amputation;  Surgeon: Nadara Mustard, MD;  Location: [facility] OR;  Service: Orthopedics;  Laterality: Left;   AMPUTATION Left 11/18/2016   Procedure: Left 3rd and 4th Ray Amputation;  Surgeon: Nadara Mustard, MD;  Location: [facility] OR;  Service: Orthopedics;  Laterality: Left;   AMPUTATION Right 03/29/2017   Procedure: RIGHT FOOT 3RD AND 4TH RAY AMPUTATION;  Surgeon: Nadara Mustard, MD;  Location: [facility] OR;  Service: Orthopedics;  Laterality: Right;   AMPUTATION Left 08/12/2020   Procedure: LEFT TRANSMETATARSAL AMPUTATION;  Surgeon: Nadara Mustard, MD;  Location: [facility] OR;  Service: Orthopedics;  Laterality: Left;   APPLICATION OF WOUND VAC Left 08/12/2020   Procedure: APPLICATION OF WOUND VAC;  Surgeon: Nadara Mustard, MD;  Location: [facility] OR;  Service: Orthopedics;  Laterality: Left;   COLONOSCOPY     LAPAROSCOPIC APPENDECTOMY  01/05/2011   Procedure: APPENDECTOMY LAPAROSCOPIC;  Surgeon: Valarie Merino, MD;  Location: [facility];  Service: General;  Laterality: N/A;   OOPHORECTOMY  2001   ROTATOR CUFF REPAIR Right    x 2   TUBAL LIGATION  VAGINAL HYSTERECTOMY  2001    Prior to Admission medications   Medication Sig Start Date End Date Taking? Authorizing Provider  betamethasone dipropionate (DIPROLENE) 0.05 % cream Apply 1 application topically 2 (two) times daily as needed (IRRITATION). 11/30/17  Yes Kriste Basque R, DO  Blood Glucose Monitoring Suppl (ONE TOUCH ULTRA 2) w/Device KIT Use to check blood sugar 10/04/21  Yes Carlus Pavlov, MD  Calcium Carb-Cholecalciferol (CALCIUM + VITAMIN D3) 600-10 MG-MCG TABS Take 2 tablets by mouth daily. 11/18/21  Yes Ardith Dark, MD  cetirizine (ZYRTEC) 10 MG tablet Take 1 tablet (10 mg total) by mouth daily. 02/21/22  Yes Dulce Sellar, NP  Fluocinolone Acetonide 0.01 % OIL Place 1-2 drops into  both ears daily. As needed 11/18/21  Yes Ardith Dark, MD  gabapentin (NEURONTIN) 100 MG capsule TAKE 2 TO 3 CAPSULES AT    BEDTIME 09/01/22  Yes Carlus Pavlov, MD  glucose blood (ONETOUCH ULTRA) test strip USE 1 STRIP TO CHECK GLUCOSE 3-4 TIMES DAILY 03/16/22  Yes Carlus Pavlov, MD  insulin degludec (TRESIBA FLEXTOUCH) 200 UNIT/ML FlexTouch Pen Inject 34 Units into the skin daily. 02/03/22  Yes Carlus Pavlov, MD  Insulin Pen Needle 32G X 4 MM MISC Use 1x a day 10/04/21  Yes Carlus Pavlov, MD  insulin regular (NOVOLIN R RELION) 100 units/mL injection Inject 0.2-0.3 mLs (20-30 Units total) into the skin 3 (three) times daily before meals. Per sliding scale 06/02/20  Yes Vann, Jessica U, DO  Insulin Syringe-Needle U-100 (RELION INSULIN SYRINGE 1ML/31G) 31G X 5/16" 1 ML MISC USE 2 TIMES A DAY 10/10/17  Yes Carlus Pavlov, MD  irbesartan-hydrochlorothiazide (AVALIDE) 150-12.5 MG tablet Take 1 tablet by mouth daily. 10/17/22  Yes Ardith Dark, MD  metFORMIN (GLUCOPHAGE) 1000 MG tablet TAKE 1 TABLET TWICE DAILY  WITH MEALS 10/10/22  Yes Carlus Pavlov, MD  metoprolol succinate (TOPROL-XL) 25 MG 24 hr tablet Take 1 tablet (25 mg total) by mouth daily. 10/10/22  Yes Pricilla Riffle, MD  omeprazole (PRILOSEC) 40 MG capsule TAKE 1 CAPSULE TWICE DAILY 12/31/21  Yes Unk Lightning, [place]  Probiotic Product (PROBIOTIC DAILY PO) Take 1 capsule by mouth daily.   Yes [provider]  rosuvastatin (CRESTOR) 20 MG tablet TAKE 1 TABLET DAILY 12/31/21  Yes Pricilla Riffle, MD  silver sulfADIAZINE (SILVADENE) 1 % cream Apply 1 Application topically daily. 04/21/22  Yes Nadara Mustard, MD  vitamin B-12 (CYANOCOBALAMIN) 1000 MCG tablet Take 1,000 mcg daily by mouth.   Yes [provider]  vitamin C (ASCORBIC ACID) 500 MG tablet Take 500 mg by mouth daily.   Yes [provider]  acetaminophen (TYLENOL) 500 MG tablet Take 500 mg by mouth every 8 (eight) hours as needed for mild  pain.     [provider]  azelastine (ASTELIN) 0.1 % nasal spray Place 2 sprays into both nostrils 2 (two) times daily. 11/10/21   Ardith Dark, MD  cephALEXin (KEFLEX) 500 MG capsule Take 1 capsule (500 mg total) by mouth 3 (three) times daily. Patient not taking: Reported on 11/01/2022 09/13/22   Adonis Huguenin, NP  clotrimazole (CLOTRIMAZOLE AF) 1 % cream Apply 1 Application topically 2 (two) times daily. 03/22/22   Dulce Sellar, NP  ferrous sulfate 325 (65 FE) MG tablet Take by mouth.    [provider]  glucagon (GLUCAGEN) 1 MG SOLR injection Inject 1 mg into the muscle once as needed for up to 1 dose for low blood  sugar. 07/10/18   Carlus Pavlov, MD  OVER THE COUNTER MEDICATION Apply 1-3 application topically at bedtime as needed (foot pain). TOPRICIN FOOT CREAM - NEUROPATHY FOOT CREAM **APPLIES TO BOTH FEET AT BEDTIME**    [provider]  tirzepatide Greggory Keen) 5 MG/0.5ML Pen Inject 5 mg into the skin once a week. 06/27/22   Carlus Pavlov, MD    Current Outpatient Medications  Medication Sig Dispense Refill   betamethasone dipropionate (DIPROLENE) 0.05 % cream Apply 1 application topically 2 (two) times daily as needed (IRRITATION). 30 g 0   Blood Glucose Monitoring Suppl (ONE TOUCH ULTRA 2) w/Device KIT Use to check blood sugar 1 kit 0   Calcium Carb-Cholecalciferol (CALCIUM + VITAMIN D3) 600-10 MG-MCG TABS Take 2 tablets by mouth daily. 90 tablet 3   cetirizine (ZYRTEC) 10 MG tablet Take 1 tablet (10 mg total) by mouth daily. 30 tablet 11   Fluocinolone Acetonide 0.01 % OIL Place 1-2 drops into both ears daily. As needed 20 mL 0   gabapentin (NEURONTIN) 100 MG capsule TAKE 2 TO 3 CAPSULES AT    BEDTIME 180 capsule 5   glucose blood (ONETOUCH ULTRA) test strip USE 1 STRIP TO CHECK GLUCOSE 3-4 TIMES DAILY 400 each 3   insulin degludec (TRESIBA FLEXTOUCH) 200 UNIT/ML FlexTouch Pen Inject 34 Units into the skin daily. 12 mL 3   Insulin Pen Needle 32G  X 4 MM MISC Use 1x a day 100 each 3   insulin regular (NOVOLIN R RELION) 100 units/mL injection Inject 0.2-0.3 mLs (20-30 Units total) into the skin 3 (three) times daily before meals. Per sliding scale     Insulin Syringe-Needle U-100 (RELION INSULIN SYRINGE 1ML/31G) 31G X 5/16" 1 ML MISC USE 2 TIMES A DAY 100 each 2   irbesartan-hydrochlorothiazide (AVALIDE) 150-12.5 MG tablet Take 1 tablet by mouth daily. 90 tablet 3   metFORMIN (GLUCOPHAGE) 1000 MG tablet TAKE 1 TABLET TWICE DAILY  WITH MEALS 180 tablet 3   metoprolol succinate (TOPROL-XL) 25 MG 24 hr tablet Take 1 tablet (25 mg total) by mouth daily. 90 tablet 3   omeprazole (PRILOSEC) 40 MG capsule TAKE 1 CAPSULE TWICE DAILY 180 capsule 3   Probiotic Product (PROBIOTIC DAILY PO) Take 1 capsule by mouth daily.     rosuvastatin (CRESTOR) 20 MG tablet TAKE 1 TABLET DAILY 90 tablet 3   silver sulfADIAZINE (SILVADENE) 1 % cream Apply 1 Application topically daily. 50 g 0   vitamin B-12 (CYANOCOBALAMIN) 1000 MCG tablet Take 1,000 mcg daily by mouth.     vitamin C (ASCORBIC ACID) 500 MG tablet Take 500 mg by mouth daily.     acetaminophen (TYLENOL) 500 MG tablet Take 500 mg by mouth every 8 (eight) hours as needed for mild pain.      azelastine (ASTELIN) 0.1 % nasal spray Place 2 sprays into both nostrils 2 (two) times daily. 30 mL 12   cephALEXin (KEFLEX) 500 MG capsule Take 1 capsule (500 mg total) by mouth 3 (three) times daily. (Patient not taking: Reported on 11/01/2022) 30 capsule 0   clotrimazole (CLOTRIMAZOLE AF) 1 % cream Apply 1 Application topically 2 (two) times daily. 35.4 g 2   ferrous sulfate 325 (65 FE) MG tablet Take by mouth.     glucagon (GLUCAGEN) 1 MG SOLR injection Inject 1 mg into the muscle once as needed for up to 1 dose for low blood sugar. 1 each 11   OVER THE COUNTER MEDICATION Apply 1-3 application topically at bedtime  as needed (foot pain). TOPRICIN FOOT CREAM - NEUROPATHY FOOT CREAM **APPLIES TO BOTH FEET AT BEDTIME**      tirzepatide (MOUNJARO) 5 MG/0.5ML Pen Inject 5 mg into the skin once a week. 6 mL 0   Current Facility-Administered Medications  Medication Dose Route Frequency Provider Last Rate Last Admin   0.9 %  sodium chloride infusion  500 mL Intravenous Continuous Meryl Dare, MD        Allergies as of 11/01/2022 - Review Complete 11/01/2022  Allergen Reaction Noted   Doxycycline calcium  06/02/2020   Codeine Other (See Comments) 10/31/2007   Propoxyphene hcl Itching 10/31/2007    Family History  Problem Relation Age of Onset   Lung cancer Mother 31       smoked heavily   Emphysema Mother    Hypertension Father    Hyperlipidemia Father    Diabetes Father    Coronary artery disease Father    Dementia Father    COPD Father        smoked   Stomach cancer Paternal Aunt    Brain cancer Paternal Uncle    Irritable bowel syndrome Other        Several family members on fathers side    Diabetes Other    Stomach cancer Maternal Aunt    Lung cancer Paternal Uncle    Heart disease Paternal Uncle    Colon cancer Neg Hx     Social History   Socioeconomic History   Marital status: Married    Spouse name: Not on file   Number of children: 2   Years of education: Not on file   Highest education level: Not on file  Occupational History   Occupation: Retired   Tobacco Use   Smoking status: Never   Smokeless tobacco: Never  Vaping Use   Vaping status: Never Used  Substance and Sexual Activity   Alcohol use: Never   Drug use: Never   Sexual activity: Yes    Birth control/protection: Surgical  Other Topics Concern   Not on file  Social History Narrative   Caffeine daily    [employer], [employer] no diploma   Married '66   1 dtr- '78; 1 son '71; 2 grandchildren   Occupation: retired 04   Dad with alzheimers-had to place in [place] (summer '10)         Social Determinants of Corporate investment banker Strain: Not on Ship broker Insecurity: Not on file  Transportation Needs: Not on  file  Physical Activity: Not on file  Stress: Not on file  Social Connections: Not on file  Intimate Partner Violence: Not on file    Review of Systems:  All systems reviewed were negative except where noted in HPI.   Physical Exam:  General:  Alert, well-developed, in NAD Head:  Normocephalic and atraumatic. Eyes:  Sclera clear, no icterus.   Conjunctiva pink. Ears:  Normal auditory acuity. Mouth:  No deformity or lesions.  Neck:  Supple; no masses. Lungs:  Clear throughout to auscultation.   No wheezes, crackles, or rhonchi.  Heart:  Regular rate and rhythm; no murmurs. Abdomen:  Soft, nondistended, nontender. No masses, hepatomegaly. No palpable masses.  Normal bowel sounds.    Rectal:  Deferred   Msk:  Symmetrical without gross deformities. Extremities:  Without edema. Neurologic:  Alert and  oriented x 4; grossly normal neurologically. Skin:  Intact without significant lesions or rashes. Psych:  Alert and cooperative. Normal mood and affect.  Venita Lick. Russella Dar  11/01/2022, 7:56 AM See Loretha Stapler, [facility], to contact our on call provider

## 2022-11-01 NOTE — Patient Instructions (Signed)
-await pathology results - No repeat colonoscopy needed for surveillance recommended.  -Continue present medications  -follow up with your primary care doctor for management of iron deficiency   YOU HAD AN ENDOSCOPIC PROCEDURE TODAY AT THE [facility]:   Refer to the procedure report that was given to you for any specific questions about what was found during the examination.  If the procedure report does not answer your questions, please call your gastroenterologist to clarify.  If you requested that your care partner not be given the details of your procedure findings, then the procedure report has been included in a sealed envelope for you to review at your convenience later.  YOU SHOULD EXPECT: Some feelings of bloating in the abdomen. Passage of more gas than usual.  Walking can help get rid of the air that was put into your GI tract during the procedure and reduce the bloating. If you had a lower endoscopy (such as a colonoscopy or flexible sigmoidoscopy) you may notice spotting of blood in your stool or on the toilet paper. If you underwent a bowel prep for your procedure, you may not have a normal bowel movement for a few days.  Please Note:  You might notice some irritation and congestion in your nose or some drainage.  This is from the oxygen used during your procedure.  There is no need for concern and it should clear up in a day or so.  SYMPTOMS TO REPORT IMMEDIATELY:  Following lower endoscopy (colonoscopy or flexible sigmoidoscopy):  Excessive amounts of blood in the stool  Significant tenderness or worsening of abdominal pains  Swelling of the abdomen that is new, acute  Fever of 100F or higher  Following upper endoscopy (EGD)  Vomiting of blood or coffee ground material  New chest pain or pain under the shoulder blades  Painful or persistently difficult swallowing  New shortness of breath  Fever of 100F or higher  Black, tarry-looking stools  For urgent or  emergent issues, a gastroenterologist can be reached at any hour by calling [phone]. Do not use MyChart messaging for urgent concerns.    DIET:  We do recommend a small meal at first, but then you may proceed to your regular diet.  Drink plenty of fluids but you should avoid alcoholic beverages for 24 hours.  ACTIVITY:  You should plan to take it easy for the rest of today and you should NOT DRIVE or use heavy machinery until tomorrow (because of the sedation medicines used during the test).    FOLLOW UP: Our staff will call the number listed on your records the next business day following your procedure.  We will call around 7:15- 8:00 am to check on you and address any questions or concerns that you may have regarding the information given to you following your procedure. If we do not reach you, we will leave a message.     If any biopsies were taken you will be contacted by phone or by letter within the next 1-3 weeks.  Please call us at [phone] if you have not heard about the biopsies in 3 weeks.    SIGNATURES/CONFIDENTIALITY: You and/or your care partner have signed paperwork which will be entered into your electronic medical record.  These signatures attest to the fact that that the information above on your After Visit Summary has been reviewed and is understood.  Full responsibility of the confidentiality of this discharge information lies with you and/or your care-partner.

## 2022-11-01 NOTE — Op Note (Signed)
[facility] Patient Name: Beth Holden Procedure Date: 11/01/2022 8:00 AM MRN: [id number] Endoscopist: Meryl Dare , MD, [phone] Age: 77 Referring MD:  Date of Birth: Jul 17, 1945 Gender: Female Account #: [id number] Procedure:                Upper GI endoscopy Indications:              Unexplained iron deficiency anemia,                            Gastroesophageal reflux disease Medicines:                Monitored Anesthesia Care Procedure:                Pre-Anesthesia Assessment:                           - Prior to the procedure, a History and Physical                            was performed, and patient medications and                            allergies were reviewed. The patient's tolerance of                            previous anesthesia was also reviewed. The risks                            and benefits of the procedure and the sedation                            options and risks were discussed with the patient.                            All questions were answered, and informed consent                            was obtained. Prior Anticoagulants: The patient has                            taken no anticoagulant or antiplatelet agents. ASA                            Grade Assessment: II - A patient with mild systemic                            disease. After reviewing the risks and benefits,                            the patient was deemed in satisfactory condition to                            undergo the procedure.  After obtaining informed consent, the endoscope was                            passed under direct vision. Throughout the                            procedure, the patient's blood pressure, pulse, and                            oxygen saturations were monitored continuously. The                            GIF [id number] #[id number] was introduced through the                            mouth, and advanced to the second  part of duodenum.                            The upper GI endoscopy was accomplished without                            difficulty. The patient tolerated the procedure                            well. Scope In: Scope Out: Findings:                 The examined esophagus was normal.                           Patchy mildly erythematous mucosa without bleeding                            was found in the gastric fundus and in the gastric                            body. Biopsies were taken with a cold forceps for                            histology.                           The exam of the stomach was otherwise normal.                           The duodenal bulb and second portion of the                            duodenum were normal. Biopsies for histology were                            taken with a cold forceps for evaluation of celiac                            disease. Complications:  No immediate complications. Estimated Blood Loss:     Estimated blood loss was minimal. Impression:               - Normal esophagus.                           - Erythematous mucosa in the gastric fundus and                            gastric body. Biopsied.                           - Normal duodenal bulb and second portion of the                            duodenum. Biopsied. Recommendation:           - Patient has a contact number available for                            emergencies. The signs and symptoms of potential                            delayed complications were discussed with the                            patient. Return to normal activities tomorrow.                            Written discharge instructions were provided to the                            patient.                           - Resume previous diet.                           - Continue present medications.                           - Await pathology results.                           - Iron deficiency d/t  colonic AVM.                           - Follow up with PCP for mgmt of iron deficiency. Meryl Dare, MD 11/01/2022 8:39:40 AM This report has been signed electronically.

## 2022-11-01 NOTE — Progress Notes (Signed)
Pt's states no medical or surgical changes since previsit or office visit. 

## 2022-11-01 NOTE — Op Note (Signed)
[facility] Patient Name: Beth Holden Procedure Date: 11/01/2022 8:00 AM MRN: [id number] Endoscopist: Meryl Dare , MD, [phone] Age: 77 Referring MD:  Date of Birth: 05/21/1945 Gender: Female Account #: [id number] Procedure:                Colonoscopy Indications:              Unexplained iron deficiency anemia, Personal                            history of adenomatous colon polyps Medicines:                Monitored Anesthesia Care Procedure:                Pre-Anesthesia Assessment:                           - Prior to the procedure, a History and Physical                            was performed, and patient medications and                            allergies were reviewed. The patient's tolerance of                            previous anesthesia was also reviewed. The risks                            and benefits of the procedure and the sedation                            options and risks were discussed with the patient.                            All questions were answered, and informed consent                            was obtained. Prior Anticoagulants: The patient has                            taken no anticoagulant or antiplatelet agents. ASA                            Grade Assessment: II - A patient with mild systemic                            disease. After reviewing the risks and benefits,                            the patient was deemed in satisfactory condition to                            undergo the procedure.  After obtaining informed consent, the colonoscope                            was passed under direct vision. Throughout the                            procedure, the patient's blood pressure, pulse, and                            oxygen saturations were monitored continuously. The                            Olympus Scope SN: [id number] was introduced through                            the anus and advanced to  the the cecum, identified                            by appendiceal orifice and ileocecal valve. The                            ileocecal valve, appendiceal orifice, and rectum                            were photographed. The quality of the bowel                            preparation was good. The colonoscopy was performed                            without difficulty. The patient tolerated the                            procedure well. Scope In: 8:07:42 AM Scope Out: 8:23:27 AM Scope Withdrawal Time: 0 hours 10 minutes 8 seconds  Total Procedure Duration: 0 hours 15 minutes 45 seconds  Findings:                 The perianal and digital rectal examinations were                            normal.                           A single small localized angioectasia without                            bleeding was found in the ascending colon.                           The exam was otherwise without abnormality on                            direct and retroflexion views. Complications:            No immediate complications.  Estimated blood loss:                            None. Estimated Blood Loss:     Estimated blood loss: none. Impression:               - A single non-bleeding colonic angioectasia.                           - The examination was otherwise normal on direct                            and retroflexion views.                           - No specimens collected. Recommendation:           - Patient has a contact number available for                            emergencies. The signs and symptoms of potential                            delayed complications were discussed with the                            patient. Return to normal activities tomorrow.                            Written discharge instructions were provided to the                            patient.                           - Resume previous diet.                           - Continue present medications.                            - No repeat colonoscopy due to age and the absence                            of colonic polyps. Meryl Dare, MD 11/01/2022 8:35:04 AM This report has been signed electronically.

## 2022-11-02 ENCOUNTER — Telehealth: Payer: Self-pay

## 2022-11-02 DIAGNOSIS — D0461 Carcinoma in situ of skin of right upper limb, including shoulder: Secondary | ICD-10-CM | POA: Diagnosis not present

## 2022-11-02 DIAGNOSIS — D485 Neoplasm of uncertain behavior of skin: Secondary | ICD-10-CM | POA: Diagnosis not present

## 2022-11-02 DIAGNOSIS — D045 Carcinoma in situ of skin of trunk: Secondary | ICD-10-CM | POA: Diagnosis not present

## 2022-11-02 NOTE — Telephone Encounter (Signed)
Left message on answering machine. 

## 2022-11-03 LAB — SURGICAL PATHOLOGY

## 2022-11-07 ENCOUNTER — Ambulatory Visit (INDEPENDENT_AMBULATORY_CARE_PROVIDER_SITE_OTHER): Payer: Medicare Other | Admitting: Orthopedic Surgery

## 2022-11-07 ENCOUNTER — Encounter: Payer: Self-pay | Admitting: Gastroenterology

## 2022-11-07 DIAGNOSIS — L97511 Non-pressure chronic ulcer of other part of right foot limited to breakdown of skin: Secondary | ICD-10-CM

## 2022-11-07 DIAGNOSIS — Z89432 Acquired absence of left foot: Secondary | ICD-10-CM | POA: Diagnosis not present

## 2022-11-07 DIAGNOSIS — L97521 Non-pressure chronic ulcer of other part of left foot limited to breakdown of skin: Secondary | ICD-10-CM | POA: Diagnosis not present

## 2022-11-08 ENCOUNTER — Encounter: Payer: Self-pay | Admitting: Orthopedic Surgery

## 2022-11-08 DIAGNOSIS — E119 Type 2 diabetes mellitus without complications: Secondary | ICD-10-CM | POA: Diagnosis not present

## 2022-11-08 DIAGNOSIS — H43811 Vitreous degeneration, right eye: Secondary | ICD-10-CM | POA: Diagnosis not present

## 2022-11-08 DIAGNOSIS — H25813 Combined forms of age-related cataract, bilateral: Secondary | ICD-10-CM | POA: Diagnosis not present

## 2022-11-08 DIAGNOSIS — H01026 Squamous blepharitis left eye, unspecified eyelid: Secondary | ICD-10-CM | POA: Diagnosis not present

## 2022-11-08 DIAGNOSIS — H01023 Squamous blepharitis right eye, unspecified eyelid: Secondary | ICD-10-CM | POA: Diagnosis not present

## 2022-11-08 NOTE — Progress Notes (Signed)
Office Visit Note   [name]           Date of Birth: Mar 24, 1945           MRN: [id number] Visit Date: 11/07/2022              Requested by: Ardith Dark, MD [address] [place],  [place] [address] PCP: Ardith Dark, MD  Chief Complaint  Patient presents with   Right Foot - Follow-up, Wound Check   Left Foot - Follow-up, Wound Check      HPI: Patient is a 77 year old woman who presents in follow-up for Wagner grade 1 ulcers both lower extremities.  Patient is status post a left transmetatarsal amputation.  She also has painful onychomycotic nails.  Assessment & Plan: Visit Diagnoses:  1. 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HCC)   2. Non-pressure chronic ulcer of other part of right foot limited to breakdown of skin (HCC)   3. Ulcer of left foot, limited to breakdown of skin (HCC)     Plan: Nails were trimmed x 2, ulcer debrided x 1 right foot, callus pared x 1 left foot and new felt relieving donuts fabricated for her shoe wear.  Follow-Up Instructions: Return in about 4 weeks (around 12/05/2022).   Ortho Exam  Patient is alert, oriented, no adenopathy, well-dressed, normal affect, normal respiratory effort. Examination of the left transmetatarsal amputation patient has some callus over the residual limb this was pared without complication.  Examination of the right foot the Wagner grade 1 ulcer is improving.  After informed consent a 10 blade knife was used to debride the skin and soft tissue back to healthy viable bleeding granulation tissue.  There is no tunneling.  Ulcer is 1 cm diameter after debridement.  Patient has 2 thickened discolored onychomycotic nails that were trimmed on the right foot.  Imaging: No results found. No images are attached to the encounter.  Labs: Lab Results  Component Value Date   HGBA1C 6.7 (H) 09/21/2022   HGBA1C 8.4 (A) 03/02/2022   HGBA1C 9.0 (A) 09/09/2021   ESRSEDRATE 51 (H) 12/24/2019    ESRSEDRATE 36 (H) 02/24/2017   CRP 13.2 (H) 12/24/2019   CRP 1.2 (H) 02/24/2017   LABURIC 5.5 09/01/2015   REPTSTATUS 06/03/2020 FINAL 05/29/2020   CULT  05/29/2020    NO GROWTH 5 DAYS Performed at [facility], [address] [place], [place] [address]    LABORGA GROUP B STREP (S.AGALACTIAE) ISOLATED 12/26/2014     Lab Results  Component Value Date   ALBUMIN 4.1 11/05/2021   ALBUMIN 4.1 11/05/2021   ALBUMIN 3.8 09/08/2020   PREALBUMIN 19.4 03/05/2012    No results found for: "MG" No results found for: "VD25OH"  Lab Results  Component Value Date   PREALBUMIN 19.4 03/05/2012      Latest Ref Rng & Units 09/16/2022    7:46 AM 06/16/2022    8:19 AM 12/08/2021    7:59 AM  CBC EXTENDED  WBC 4.0 - 10.5 K/uL 12.4  12.6  13.1   RBC 3.87 - 5.11 MIL/uL 4.11  4.07  4.21   Hemoglobin 12.0 - 15.0 g/dL 43.3  29.5  18.8   HCT 36.0 - 46.0 % 35.7  35.1  37.6   Platelets 150 - 400 K/uL 400  425  368   NEUT# 1.7 - 7.7 K/uL 8.3  8.5  8.6   Lymph# 0.7 - 4.0 K/uL 2.9  3.0  3.4  There is no height or weight on file to calculate BMI.  Orders:  No orders of the defined types were placed in this encounter.  No orders of the defined types were placed in this encounter.    Procedures: No procedures performed  Clinical Data: No additional findings.  ROS:  All other systems negative, except as noted in the HPI. Review of Systems  Objective: Vital Signs: There were no vitals taken for this visit.  Specialty Comments:  No specialty comments available.  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) 10/23/2019   Nuclear  sclerotic cataract of both eyes 10/23/2019   Posterior vitreous detachment of right eye 10/23/2019   Retinal hemorrhage of left eye 10/23/2019   Impingement syndrome of right shoulder 07/06/2017   Iron deficiency anemia 05/11/2017   Morbid obesity (HCC) 04/24/2017   Anemia 02/24/2017   Baker's cyst 07/10/2016   Onychomycosis 12/07/2015   Upper airway cough syndrome 03/05/2014   Hypertension associated with diabetes (HCC) 12/05/2006   T2DM (type 2 diabetes mellitus) (HCC) 10/12/2006   Dyslipidemia associated with type 2 diabetes mellitus (HCC) 10/12/2006   Allergic rhinitis 10/12/2006   GERD - Followed by Dr. Russella Dar 10/12/2006   IRRITABLE BOWEL SYNDROME - followed by Dr. Russella Dar 10/12/2006   Peripheral neuropathy 10/11/2006   ALOPECIA NEC 10/11/2006   Past Medical History:  Diagnosis Date   Alopecia    Anemia, mild    Arthritis    Chronic cough    sees pulmonologist   Chronic pain    chest wall and abd - s/p extensive eval   Diabetes mellitus with neuropathy (HCC)    sees endocrine   Diverticulosis    Dyspnea    Fatty liver    GERD (gastroesophageal reflux disease)    takes Nexium bid, hx erosive esophagitis   Headache(784.0)    occasionally;r/t sinus    History of colon polyps    HTN (hypertension)    Hx of amputation of lesser toe (HCC)    sees podiatrist   Hyperlipemia    IBS (irritable bowel syndrome)    Insomnia    takes Elavil nightly   Joint pain    Neuropathy    Neuropathy    Osteomyelitis (HCC)    Pneumonia    89/2/22- patient denies   PONV (postoperative nausea and vomiting)    medication did not help with surgery on 03/28/20   Seasonal allergies    takes Zyrtec daily   Sinus tachycardia     Family History  Problem Relation Age of Onset   Lung cancer Mother 68       smoked heavily   Emphysema Mother    Hypertension Father    Hyperlipidemia Father    Diabetes Father    Coronary artery disease Father    Dementia Father    COPD Father         smoked   Stomach cancer Paternal Aunt    Brain cancer Paternal Uncle    Irritable bowel syndrome Other        Several family members on fathers side    Diabetes Other    Stomach cancer Maternal  Aunt    Lung cancer Paternal Uncle    Heart disease Paternal Uncle    Colon cancer Neg Hx     Past Surgical History:  Procedure Laterality Date   AMPUTATION  12/28/2011   Procedure: AMPUTATION DIGIT;  Surgeon: Nadara Mustard, MD;  Location: [facility] OR;  Service: Orthopedics;  Laterality: Right;  Right Foot 2nd Toe Amputation at MTP (metatarsophalangeal joint)   AMPUTATION Right 04/25/2012   Procedure: Right Foot 3rd Toe Amputation;  Surgeon: Nadara Mustard, MD;  Location: [facility] OR;  Service: Orthopedics;  Laterality: Right;  Right Foot Third Toe Amputation    AMPUTATION Right 07/27/2012   Procedure: Right 4th Toe Amputation at Metatarsophalangeal;  Surgeon: Nadara Mustard, MD;  Location: [facility] OR;  Service: Orthopedics;  Laterality: Right;  Right 4th Toe Amputation at Metatarsophalangeal   AMPUTATION Left 07/15/2016   Procedure: Left 2nd Ray Amputation;  Surgeon: Nadara Mustard, MD;  Location: [facility] OR;  Service: Orthopedics;  Laterality: Left;   AMPUTATION Left 11/18/2016   Procedure: Left 3rd and 4th Ray Amputation;  Surgeon: Nadara Mustard, MD;  Location: [facility] OR;  Service: Orthopedics;  Laterality: Left;   AMPUTATION Right 03/29/2017   Procedure: RIGHT FOOT 3RD AND 4TH RAY AMPUTATION;  Surgeon: Nadara Mustard, MD;  Location: [facility] OR;  Service: Orthopedics;  Laterality: Right;   AMPUTATION Left 08/12/2020   Procedure: LEFT TRANSMETATARSAL AMPUTATION;  Surgeon: Nadara Mustard, MD;  Location: [facility] OR;  Service: Orthopedics;  Laterality: Left;   APPLICATION OF WOUND VAC Left 08/12/2020   Procedure: APPLICATION OF WOUND VAC;  Surgeon: Nadara Mustard, MD;  Location: [facility] OR;  Service: Orthopedics;  Laterality: Left;   COLONOSCOPY     LAPAROSCOPIC APPENDECTOMY  01/05/2011   Procedure: APPENDECTOMY LAPAROSCOPIC;  Surgeon: Valarie Merino, MD;  Location: [facility] ORS;  Service: General;  Laterality: N/A;   OOPHORECTOMY  2001   ROTATOR CUFF REPAIR Right    x 2   TUBAL LIGATION     VAGINAL HYSTERECTOMY  2001   Social History   Occupational History   Occupation: Retired   Tobacco Use   Smoking status: Never   Smokeless tobacco: Never  Vaping Use   Vaping status: Never Used  Substance and Sexual Activity   Alcohol use: Never   Drug use: Never   Sexual activity: Yes    Birth control/protection: Surgical

## 2022-11-14 ENCOUNTER — Other Ambulatory Visit: Payer: Self-pay | Admitting: Family Medicine

## 2022-11-14 DIAGNOSIS — Z1231 Encounter for screening mammogram for malignant neoplasm of breast: Secondary | ICD-10-CM

## 2022-11-15 ENCOUNTER — Encounter: Payer: Self-pay | Admitting: Gastroenterology

## 2022-11-16 ENCOUNTER — Ambulatory Visit (INDEPENDENT_AMBULATORY_CARE_PROVIDER_SITE_OTHER): Payer: Medicare Other | Admitting: Internal Medicine

## 2022-11-16 ENCOUNTER — Ambulatory Visit: Payer: Medicare Other | Admitting: Family Medicine

## 2022-11-16 ENCOUNTER — Encounter: Payer: Self-pay | Admitting: Internal Medicine

## 2022-11-16 ENCOUNTER — Encounter: Payer: Self-pay | Admitting: Family Medicine

## 2022-11-16 VITALS — BP 110/68 | HR 69 | Ht 66.0 in | Wt 184.0 lb

## 2022-11-16 VITALS — BP 110/71 | HR 78 | Temp 97.8°F | Ht 66.0 in | Wt 183.4 lb

## 2022-11-16 DIAGNOSIS — G63 Polyneuropathy in diseases classified elsewhere: Secondary | ICD-10-CM | POA: Diagnosis not present

## 2022-11-16 DIAGNOSIS — Z7985 Long-term (current) use of injectable non-insulin antidiabetic drugs: Secondary | ICD-10-CM

## 2022-11-16 DIAGNOSIS — Z0000 Encounter for general adult medical examination without abnormal findings: Secondary | ICD-10-CM

## 2022-11-16 DIAGNOSIS — E1165 Type 2 diabetes mellitus with hyperglycemia: Secondary | ICD-10-CM

## 2022-11-16 DIAGNOSIS — I152 Hypertension secondary to endocrine disorders: Secondary | ICD-10-CM | POA: Diagnosis not present

## 2022-11-16 DIAGNOSIS — Z794 Long term (current) use of insulin: Secondary | ICD-10-CM | POA: Diagnosis not present

## 2022-11-16 DIAGNOSIS — Z23 Encounter for immunization: Secondary | ICD-10-CM

## 2022-11-16 DIAGNOSIS — E1169 Type 2 diabetes mellitus with other specified complication: Secondary | ICD-10-CM | POA: Diagnosis not present

## 2022-11-16 DIAGNOSIS — J309 Allergic rhinitis, unspecified: Secondary | ICD-10-CM

## 2022-11-16 DIAGNOSIS — N183 Chronic kidney disease, stage 3 unspecified: Secondary | ICD-10-CM

## 2022-11-16 DIAGNOSIS — K219 Gastro-esophageal reflux disease without esophagitis: Secondary | ICD-10-CM | POA: Diagnosis not present

## 2022-11-16 DIAGNOSIS — E1122 Type 2 diabetes mellitus with diabetic chronic kidney disease: Secondary | ICD-10-CM

## 2022-11-16 DIAGNOSIS — E1159 Type 2 diabetes mellitus with other circulatory complications: Secondary | ICD-10-CM | POA: Diagnosis not present

## 2022-11-16 DIAGNOSIS — Z7984 Long term (current) use of oral hypoglycemic drugs: Secondary | ICD-10-CM | POA: Diagnosis not present

## 2022-11-16 DIAGNOSIS — E785 Hyperlipidemia, unspecified: Secondary | ICD-10-CM | POA: Diagnosis not present

## 2022-11-16 LAB — BASIC METABOLIC PANEL
BUN: 24 mg/dL — ABNORMAL HIGH (ref 6–23)
CO2: 28 meq/L (ref 19–32)
Calcium: 10.4 mg/dL (ref 8.4–10.5)
Chloride: 101 meq/L (ref 96–112)
Creatinine, Ser: 1.18 mg/dL (ref 0.40–1.20)
GFR: 45.31 mL/min — ABNORMAL LOW (ref >60.00)
Glucose, Bld: 119 mg/dL — ABNORMAL HIGH (ref 70–99)
Potassium: 4.1 meq/L (ref 3.5–5.1)
Sodium: 139 meq/L (ref 135–145)

## 2022-11-16 LAB — MICROALBUMIN / CREATININE URINE RATIO
Creatinine,U: 73.5 mg/dL
Microalb Creat Ratio: 1 mg/g (ref 0.0–30.0)
Microalb, Ur: <0.7 mg/dL (ref 0.0–1.9)

## 2022-11-16 MED ORDER — CETIRIZINE HCL 10 MG PO TABS: 10.0000 mg | ORAL_TABLET | Freq: Every day | ORAL | 3 refills | Status: DC

## 2022-11-16 NOTE — Assessment & Plan Note (Signed)
Patient down 45 pounds since last year.  Congratulated her on weight loss.  She will continue to work on lifestyle interventions.

## 2022-11-16 NOTE — Assessment & Plan Note (Signed)
Follows with GI.  On omeprazole 40 mg twice daily.

## 2022-11-16 NOTE — Assessment & Plan Note (Signed)
Blood pressure at goal on irbesartan HCTZ 150-12.5 once daily and metoprolol succinate 25 mg daily.

## 2022-11-16 NOTE — Patient Instructions (Signed)
It was very nice to see you today!  We will give your flu shot today.  No other changes today.  Please try using lotion on your back.  Keep up the great work with diet and exercise.  We will see back in year.  Come back sooner if needed.  Return in about 1 year (around 11/16/2023) for Annual Physical.   Take care, Dr Jimmey Ralph  PLEASE NOTE:  If you had any lab tests, please let us know if you have not heard back within a few days. You may see your results on mychart before we have a chance to review them but we will give you a call once they are reviewed by [place].   If we ordered any referrals today, please let us know if you have not heard from their office within the next week.   If you had any urgent prescriptions sent in today, please check with the pharmacy within an hour of our visit to make sure the prescription was transmitted appropriately.   Please try these tips to maintain a healthy lifestyle:  Eat at least 3 REAL meals and 1-2 snacks per day.  Aim for no more than 5 hours between eating.  If you eat breakfast, please do so within one hour of getting up.   Each meal should contain half fruits/vegetables, one quarter protein, and one quarter carbs (no bigger than a computer mouse)  Cut down on sweet beverages. This includes juice, soda, and sweet tea.   Drink at least 1 glass of water with each meal and aim for at least 8 glasses per day  Exercise at least 150 minutes every week.    Preventive Care 39 Years and Older, Female Preventive care refers to lifestyle choices and visits with your health care provider that can promote health and wellness. Preventive care visits are also called wellness exams. What can I expect for my preventive care visit? Counseling Your health care provider may ask you questions about your: Medical history, including: Past medical problems. Family medical history. Pregnancy and menstrual history. History of falls. Current health,  including: Memory and ability to understand (cognition). Emotional well-being. Home life and relationship well-being. Sexual activity and sexual health. Lifestyle, including: Alcohol, nicotine or tobacco, and drug use. Access to firearms. Diet, exercise, and sleep habits. Work and work Astronomer. Sunscreen use. Safety issues such as seatbelt and bike helmet use. Physical exam Your health care provider will check your: Height and weight. These may be used to calculate your BMI (body mass index). BMI is a measurement that tells if you are at a healthy weight. Waist circumference. This measures the distance around your waistline. This measurement also tells if you are at a healthy weight and may help predict your risk of certain diseases, such as type 2 diabetes and high blood pressure. Heart rate and blood pressure. Body temperature. Skin for abnormal spots. What immunizations do I need?  Vaccines are usually given at various ages, according to a schedule. Your health care provider will recommend vaccines for you based on your age, medical history, and lifestyle or other factors, such as travel or where you work. What tests do I need? Screening Your health care provider may recommend screening tests for certain conditions. This may include: Lipid and cholesterol levels. Hepatitis C test. Hepatitis B test. HIV (human immunodeficiency virus) test. STI (sexually transmitted infection) testing, if you are at risk. Lung cancer screening. Colorectal cancer screening. Diabetes screening. This is done by checking your  blood sugar (glucose) after you have not eaten for a while (fasting). Mammogram. Talk with your health care provider about how often you should have regular mammograms. BRCA-related cancer screening. This may be done if you have a family history of breast, ovarian, tubal, or peritoneal cancers. Bone density scan. This is done to screen for osteoporosis. Talk with your health  care provider about your test results, treatment options, and if necessary, the need for more tests. Follow these instructions at home: Eating and drinking  Eat a diet that includes fresh fruits and vegetables, whole grains, lean protein, and low-fat dairy products. Limit your intake of foods with high amounts of sugar, saturated fats, and salt. Take vitamin and mineral supplements as recommended by your health care provider. Do not drink alcohol if your health care provider tells you not to drink. If you drink alcohol: Limit how much you have to 0-1 drink a day. Know how much alcohol is in your drink. In the [place] one drink equals one 12 oz bottle of beer (355 mL), one 5 oz glass of wine (148 mL), or one 1 oz glass of hard liquor (44 mL). Lifestyle Brush your teeth every morning and night with fluoride toothpaste. Floss one time each day. Exercise for at least 30 minutes 5 or more days each week. Do not use any products that contain nicotine or tobacco. These products include cigarettes, chewing tobacco, and vaping devices, such as e-cigarettes. If you need help quitting, ask your health care provider. Do not use drugs. If you are sexually active, practice safe sex. Use a condom or other form of protection in order to prevent STIs. Take aspirin only as told by your health care provider. Make sure that you understand how much to take and what form to take. Work with your health care provider to find out whether it is safe and beneficial for you to take aspirin daily. Ask your health care provider if you need to take a cholesterol-lowering medicine (statin). Find healthy ways to manage stress, such as: Meditation, yoga, or listening to music. Journaling. Talking to a trusted person. Spending time with friends and family. Minimize exposure to UV radiation to reduce your risk of skin cancer. Safety Always wear your seat belt while driving or riding in a vehicle. Do not drive: If you have  been drinking alcohol. Do not ride with someone who has been drinking. When you are tired or distracted. While texting. If you have been using any mind-altering substances or drugs. Wear a helmet and other protective equipment during sports activities. If you have firearms in your house, make sure you follow all gun safety procedures. What's next? Visit your health care provider once a year for an annual wellness visit. Ask your health care provider how often you should have your eyes and teeth checked. Stay up to date on all vaccines. This information is not intended to replace advice given to you by your health care provider. Make sure you discuss any questions you have with your health care provider. Document Revised: 06/24/2020 Document Reviewed: 06/24/2020 Elsevier Patient Education  2024 [employer].

## 2022-11-16 NOTE — Progress Notes (Signed)
Chief Complaint:  Beth Holden is a 77 y.o. female who presents today for a subsequent Medicare Annual Wellness Visit and to discuss management of her chronic medical problems.  Assessment/Plan:  New/Acute Problems: Xerosis Cutis  Recommended topical emollients.  She will let us know if not improving.  Chronic Problems Addressed Today: T2DM (type 2 diabetes mellitus) (HCC) Following with endocrinology.  A1c's have been improving.  She is on Mounjaro 5 mg weekly, metformin 1000 mg twice daily and insulin.  She has visit with endocrinology later today.  Dyslipidemia associated with type 2 diabetes mellitus [facility]) Following with cardiology for this.  She is on Crestor 20 mg daily.  Hypertension associated with diabetes (HCC) Blood pressure at goal on irbesartan HCTZ 150-12.5 once daily and metoprolol succinate 25 mg daily.  Allergic rhinitis Stable on Zyrtec.  GERD - Followed by Dr. Jeanell Sparrow with GI.  On omeprazole 40 mg twice daily.   Morbid obesity (HCC) Patient down 45 pounds since last year.  Congratulated her on weight loss.  She will continue to work on lifestyle interventions.  Preventative Healthcare Flu shot given today.  Follows with orthopedics for foot exam.  Check urine microalbumin today.  She can get shingles, tetanus, and COVID vaccines at the pharmacy.  Longer needs colon cancer screening due to age. Health Maintenance Due  Topic Date Due   Diabetic kidney evaluation - Urine ACR  12/18/2020   INFLUENZA VACCINE  08/11/2022   FOOT EXAM  09/03/2022   Diabetic kidney evaluation - eGFR measurement  11/06/2022   Medicare Annual Wellness (AWV)  11/11/2022    During the course of the visit the patient was educated and counseled about appropriate screening and preventive services including:        Fall prevention   Nutrition Physical Activity Weight Management Cognition    Subjective:  HPI:  Health Risk Assessment: Patient considers her overall  health to be good. He has no difficulty performing the following: Preparing food and eating Bathing  Getting dressed Using the toilet Shopping Managing Finances Moving around from place to place  She has not had any falls within the past year.      11/16/2022    7:46 AM  Depression screen PHQ 2/9  Decreased Interest 0  Down, Depressed, Hopeless 0  PHQ - 2 Score 0    Lifestyle Factors: Diet: Eating smaller portion sizes Exercise: Limited  Patient Care Team: Ardith Dark, MD as PCP - General (Family Medicine) Pricilla Riffle, MD as PCP - Cardiology (Cardiology) Petrinitz, Tinnie Gens, DPM (Inactive) (Podiatry) Meryl Dare, MD as Consulting Physician (Gastroenterology) Carlus Pavlov, MD as Consulting Physician (Internal Medicine) Jerilee Field, MD as Consulting Physician (Urology) Shirlean Kelly, MD as Consulting Physician (Neurosurgery) Nadara Mustard, MD as Consulting Physician (Orthopedic Surgery)   She has no acute complaints today.   ROS: Per HPI, otherwise a complete review of systems was negative.   PMH:  The following were reviewed and entered/updated in epic: Past Medical History:  Diagnosis Date   Alopecia    Anemia, mild    Arthritis    Chronic cough    sees pulmonologist   Chronic pain    chest wall and abd - s/p extensive eval   Diabetes mellitus with neuropathy (HCC)    sees endocrine   Diverticulosis    Dyspnea    Fatty liver    GERD (gastroesophageal reflux disease)    takes Nexium bid, hx erosive esophagitis   Headache(784.0)  occasionally;r/t sinus    History of colon polyps    HTN (hypertension)    Hx of amputation of lesser toe (HCC)    sees podiatrist   Hyperlipemia    IBS (irritable bowel syndrome)    Insomnia    takes Elavil nightly   Joint pain    Neuropathy    Neuropathy    Osteomyelitis (HCC)    Pneumonia    89/2/22- patient denies   PONV (postoperative nausea and vomiting)    medication did not help with  surgery on 03/28/20   Seasonal allergies    takes Zyrtec daily   Sinus tachycardia    Patient Active Problem List   Diagnosis Date Noted   Osteopenia Last DEXA 11/23 11/18/2021   CKD stage 3 due to type 2 diabetes mellitus (HCC) 11/10/2021   Mild cognitive impairment 11/10/2021   Coccyx pain 12/18/2020   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HCC) 10/28/2020   Memory loss 11/21/2019   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) 10/23/2019   Nuclear sclerotic cataract of both eyes 10/23/2019   Posterior vitreous detachment of right eye 10/23/2019   Retinal hemorrhage of left eye 10/23/2019   Impingement syndrome of right shoulder 07/06/2017   Iron deficiency anemia 05/11/2017   Morbid obesity (HCC) 04/24/2017   Anemia 02/24/2017   Baker's cyst 07/10/2016   Onychomycosis 12/07/2015   Upper airway cough syndrome 03/05/2014   Hypertension associated with diabetes (HCC) 12/05/2006   T2DM (type 2 diabetes mellitus) (HCC) 10/12/2006   Dyslipidemia associated with type 2 diabetes mellitus (HCC) 10/12/2006   Allergic rhinitis 10/12/2006   GERD - Followed by Dr. Russella Dar 10/12/2006   IRRITABLE BOWEL SYNDROME - followed by Dr. Russella Dar 10/12/2006   Peripheral neuropathy 10/11/2006   ALOPECIA NEC 10/11/2006   Past Surgical History:  Procedure Laterality Date   AMPUTATION  12/28/2011   Procedure: AMPUTATION DIGIT;  Surgeon: Nadara Mustard, MD;  Location: [facility] OR;  Service: Orthopedics;  Laterality: Right;  Right Foot 2nd Toe Amputation at MTP (metatarsophalangeal joint)   AMPUTATION Right 04/25/2012   Procedure: Right Foot 3rd Toe Amputation;  Surgeon: Nadara Mustard, MD;  Location: [facility] OR;  Service: Orthopedics;  Laterality: Right;  Right Foot Third Toe Amputation    AMPUTATION Right 07/27/2012   Procedure: Right 4th Toe Amputation at Metatarsophalangeal;  Surgeon: Nadara Mustard, MD;  Location: [facility] OR;  Service: Orthopedics;  Laterality: Right;  Right 4th Toe Amputation at  Metatarsophalangeal   AMPUTATION Left 07/15/2016   Procedure: Left 2nd Ray Amputation;  Surgeon: Nadara Mustard, MD;  Location: [facility] OR;  Service: Orthopedics;  Laterality: Left;   AMPUTATION Left 11/18/2016   Procedure: Left 3rd and 4th Ray Amputation;  Surgeon: Nadara Mustard, MD;  Location: [facility] OR;  Service: Orthopedics;  Laterality: Left;   AMPUTATION Right 03/29/2017   Procedure: RIGHT FOOT 3RD AND 4TH RAY AMPUTATION;  Surgeon: Nadara Mustard, MD;  Location: [facility] OR;  Service: Orthopedics;  Laterality: Right;   AMPUTATION Left 08/12/2020   Procedure: LEFT TRANSMETATARSAL AMPUTATION;  Surgeon: Nadara Mustard, MD;  Location: [facility] OR;  Service: Orthopedics;  Laterality: Left;   APPLICATION OF WOUND VAC Left 08/12/2020   Procedure: APPLICATION OF WOUND VAC;  Surgeon: Nadara Mustard, MD;  Location: [facility] OR;  Service: Orthopedics;  Laterality: Left;   COLONOSCOPY     LAPAROSCOPIC APPENDECTOMY  01/05/2011   Procedure: APPENDECTOMY LAPAROSCOPIC;  Surgeon: Valarie Merino, MD;  Location: [facility] ORS;  Service: General;  Laterality: N/A;   OOPHORECTOMY  2001   ROTATOR CUFF REPAIR Right    x 2   TUBAL LIGATION     VAGINAL HYSTERECTOMY  2001    Family History  Problem Relation Age of Onset   Lung cancer Mother 26       smoked heavily   Emphysema Mother    Hypertension Father    Hyperlipidemia Father    Diabetes Father    Coronary artery disease Father    Dementia Father    COPD Father        smoked   Stomach cancer Paternal Aunt    Brain cancer Paternal Uncle    Irritable bowel syndrome Other        Several family members on fathers side    Diabetes Other    Stomach cancer Maternal Aunt    Lung cancer Paternal Uncle    Heart disease Paternal Uncle    Colon cancer Neg Hx     Medications- reviewed and updated Current Outpatient Medications  Medication Sig Dispense Refill   acetaminophen (TYLENOL) 500 MG tablet Take 500 mg by mouth every 8 (eight) hours as needed for mild pain.      azelastine  (ASTELIN) 0.1 % nasal spray Place 2 sprays into both nostrils 2 (two) times daily. 30 mL 12   betamethasone dipropionate (DIPROLENE) 0.05 % cream Apply 1 application topically 2 (two) times daily as needed (IRRITATION). 30 g 0   Blood Glucose Monitoring Suppl (ONE TOUCH ULTRA 2) w/Device KIT Use to check blood sugar 1 kit 0   Calcium Carb-Cholecalciferol (CALCIUM + VITAMIN D3) 600-10 MG-MCG TABS Take 2 tablets by mouth daily. 90 tablet 3   clotrimazole (CLOTRIMAZOLE AF) 1 % cream Apply 1 Application topically 2 (two) times daily. 35.4 g 2   ferrous sulfate 325 (65 FE) MG tablet Take by mouth.     Fluocinolone Acetonide 0.01 % OIL Place 1-2 drops into both ears daily. As needed 20 mL 0   gabapentin (NEURONTIN) 100 MG capsule TAKE 2 TO 3 CAPSULES AT    BEDTIME 180 capsule 5   glucagon (GLUCAGEN) 1 MG SOLR injection Inject 1 mg into the muscle once as needed for up to 1 dose for low blood sugar. 1 each 11   glucose blood (ONETOUCH ULTRA) test strip USE 1 STRIP TO CHECK GLUCOSE 3-4 TIMES DAILY 400 each 3   insulin degludec (TRESIBA FLEXTOUCH) 200 UNIT/ML FlexTouch Pen Inject 34 Units into the skin daily. 12 mL 3   Insulin Pen Needle 32G X 4 MM MISC Use 1x a day 100 each 3   insulin regular (NOVOLIN R RELION) 100 units/mL injection Inject 0.2-0.3 mLs (20-30 Units total) into the skin 3 (three) times daily before meals. Per sliding scale     Insulin Syringe-Needle U-100 (RELION INSULIN SYRINGE 1ML/31G) 31G X 5/16" 1 ML MISC USE 2 TIMES A DAY 100 each 2   irbesartan-hydrochlorothiazide (AVALIDE) 150-12.5 MG tablet Take 1 tablet by mouth daily. 90 tablet 3   metFORMIN (GLUCOPHAGE) 1000 MG tablet TAKE 1 TABLET TWICE DAILY  WITH MEALS 180 tablet 3   metoprolol succinate (TOPROL-XL) 25 MG 24 hr tablet Take 1 tablet (25 mg total) by mouth daily. 90 tablet 3   omeprazole (PRILOSEC) 40 MG capsule TAKE 1 CAPSULE TWICE DAILY 180 capsule 3   OVER THE COUNTER MEDICATION Apply 1-3 application topically at bedtime as  needed (foot pain). TOPRICIN FOOT CREAM - NEUROPATHY FOOT CREAM **APPLIES TO  BOTH FEET AT BEDTIME**     Probiotic Product (PROBIOTIC DAILY PO) Take 1 capsule by mouth daily.     rosuvastatin (CRESTOR) 20 MG tablet TAKE 1 TABLET DAILY 90 tablet 3   silver sulfADIAZINE (SILVADENE) 1 % cream Apply 1 Application topically daily. 50 g 0   tirzepatide (MOUNJARO) 5 MG/0.5ML Pen Inject 5 mg into the skin once a week. 6 mL 0   vitamin B-12 (CYANOCOBALAMIN) 1000 MCG tablet Take 1,000 mcg daily by mouth.     vitamin C (ASCORBIC ACID) 500 MG tablet Take 500 mg by mouth daily.     cetirizine (ZYRTEC) 10 MG tablet Take 1 tablet (10 mg total) by mouth daily. 90 tablet 3   No current facility-administered medications for this visit.    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   Codeine Other (See Comments)    Makes her crazy   Propoxyphene Hcl Itching    *DARVOCET     Social History   Socioeconomic History   Marital status: Married    Spouse name: Not on file   Number of children: 2   Years of education: Not on file   Highest education level: Not on file  Occupational History   Occupation: Retired   Tobacco Use   Smoking status: Never   Smokeless tobacco: Never  Vaping Use   Vaping status: Never Used  Substance and Sexual Activity   Alcohol use: Never   Drug use: Never   Sexual activity: Yes    Birth control/protection: Surgical  Other Topics Concern   Not on file  Social History Narrative   Caffeine daily    [employer], [employer] no diploma   Married '66   1 dtr- '78; 1 son '71; 2 grandchildren   Occupation: retired 04   Dad with alzheimers-had to place in [place] (summer '10)         Social Determinants of Corporate investment banker Strain: Not on Ship broker Insecurity: Not on file  Transportation Needs: Not on file  Physical Activity: Not on file  Stress: Not on file  Social Connections: Not on file          Objective/Observations  Physical Exam: BP 110/71   Pulse 78   Temp 97.8 F (36.6 C) (Temporal)   Ht 5\' 6"  (1.676 m)   Wt 183 lb 6.4 oz (83.2 kg)   SpO2 97%   BMI 29.60 kg/m  Wt Readings from Last 3 Encounters:  11/16/22 183 lb 6.4 oz (83.2 kg)  11/01/22 192 lb (87.1 kg)  09/21/22 192 lb 6.4 oz (87.3 kg)    Gen: NAD, resting comfortably HEENT: TMs normal bilaterally. OP clear. No thyromegaly noted.  CV: RRR with no murmurs appreciated Pulm: NWOB, CTAB with no crackles, wheezes, or rhonchi GI: Normal bowel sounds present. Soft, Nontender, Nondistended. MSK: no edema, cyanosis, or clubbing noted Skin: warm, dry.  Xerosis cutis on back noted. Neuro: CN2-12 grossly intact. Strength 5/5 in upper and lower extremities. Reflexes symmetric and intact bilaterally. Normal minicog with 3/3 delayed word recall. Psych: Normal affect and thought content      Josslin Sanjuan M. Jimmey Ralph, MD 11/16/2022 8:21 AM

## 2022-11-16 NOTE — Assessment & Plan Note (Signed)
Following with endocrinology.  A1c's have been improving.  She is on Mounjaro 5 mg weekly, metformin 1000 mg twice daily and insulin.  She has visit with endocrinology later today.

## 2022-11-16 NOTE — Progress Notes (Addendum)
Subjective:     Patient ID: Beth Holden, female   DOB: [birth year], 77 y.o.   MRN:   HPI Beth Holden is a pleasant 77 y.o. 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  Last visit 4 months ago.  Interim history: No blurry vision, nausea, chest pain, increased urination. Overall, she feels much better on Mounjaro.  She was able to lose 23 pounds immediately after starting it.  At last visit she had constipation and decreased appetite, and these persist. On Miralax + ExLax. She has IDA >> on Fe infusions. She weaned herself off Neurontin as she felt that the pain in her feet was increased on it.  She still has some pain but improved.  Sleep  Reviewed HbA1c levels: Lab Results  Component Value Date   HGBA1C 6.7 (H) 09/21/2022   HGBA1C 6.5 07/13/2022   HGBA1C 8.4 (A) 03/02/2022   She is on:: - Metformin 1000 mg 2x a day - Tresiba 34 >> 44 units daily >> Off now for 3 mo >> restarted 12 units daily Insulin Before breakfast Before lunch Before dinner At bedtime  Regular 25(-30) >> 10-20 - 25(-30) >> 10-20   - Mounjaro - [place] approved >> 2.5 >> 5 mg weekly  She also tried: Farxiga 03/2020 -could not start b/c of  $$$ Levemir 47 units in am and 37 units in pm >> $800 for 3 mo supply Januvia 100 mg daily - $450 for 3 mo Invokana 100 mg - $455 for 3 mo Took Byetta before. We could not use Trulicity as she developed GI symptoms with it. We discussed about starting her on a VGo mechanical pump in the past. She had an appointment with diabetes education but decided not to pursue it. We also tried a sliding scale of regular insulin in the past but she was not using it. We tried Trulicity but she could not tolerate it due to nausea, diarrhea, constipation, weakness. She was previously on NPH, but changed to [place] in 04/2021.  She checks  her sugars 2-4 times a day per review of her log:  - am:  165-315 >> 99-205, 223 >> 107-241 >> 130-207 - 2h after b'fast : 218-334 >> 100-168 >> 141-201 >> 130-219 - prelunch: 212-364 >> 135-259 >> 115-174, 252 >> 92-179, 259  - 2-3h after lunch: 192-335, 402 >> 177-309 >> 99-209 >> 69-219 - before dinner: 156-345 >> 165-314 >> 82-200 >> 70-168, 206, 365 - after dinner:  1126, 223-323 >> 141-232, 257 >> 64-289 - bedtime: 124-297 >> 171-281, 380 >> 86-245 >> 58-201 - nighttime: 53, 75, 100-245, 373 >> 80-180 >> 66-203, 262 Lowest: 40s x3 (did not eat supper) >> ... 53 >> 82 >> 42 (07/2022).  Has hypoglycemia awareness in the 70s. Highest: 402 >> 372 >> 257>> 365  Meals: - Breakfast: egg, bacon, toast; grits; biscuit; fruit; sometimes skips >> 2 slices of toast + butter - Lunch: 1/2 PB sandwich or soup and yoghurt, sometimes crackers - Dinner: meat + vegetables + some starch - Snacks: fruit   + Mild CKD: BUN  Date Value Ref Range Status  11/05/2021 23 6 - 23 mg/dL Final  16/10/9602 23 6 - 23 mg/dL Final   Creatinine, Ser  Date Value Ref Range Status  11/05/2021 0.98 0.40 - 1.20 mg/dL Final  54/09/8117 1.47 0.40 - 1.20 mg/dL Final   Lab Results  Component Value Date  MICRALBCREAT 2.2 12/19/2019   MICRALBCREAT 1.0 11/23/2018   MICRALBCREAT 0.8 04/15/2013  On irbesartan.  -+ HL; latest lipid panel: 09/21/2022: 122/158/39/56 Lab Results  Component Value Date   CHOL 122 11/05/2021   HDL 39.90 11/05/2021   LDLCALC 50 11/05/2021   TRIG 159.0 (H) 11/05/2021   CHOLHDL 3 11/05/2021  On rosuvastatin 20. She has fatty liver >> was advised to lose weight.  - last eye exam: 03/02/2022: No DR. Prev. Mild NPDR OU, without macular edema (Dr. Luciana Axe).  -+ Numbness and tingling in feet-previously on amitriptyline, then on Neurontin >> tapered to off.  Last foot exam 09/02/2021.  She sees Dr. Lajoyce Corners.  Derm: Dr. Donzetta Starch.   Review of Systems  + see HPI  I reviewed pt's medications,  allergies, PMH, social hx, family hx, and changes were documented in the history of present illness. Otherwise, unchanged from my initial visit note.  Past Medical History:  Diagnosis Date   Alopecia    Anemia, mild    Arthritis    Chronic cough    sees pulmonologist   Chronic pain    chest wall and abd - s/p extensive eval   Diabetes mellitus with neuropathy (HCC)    sees endocrine   Diverticulosis    Dyspnea    Fatty liver    GERD (gastroesophageal reflux disease)    takes Nexium bid, hx erosive esophagitis   Headache(784.0)    occasionally;r/t sinus    History of colon polyps    HTN (hypertension)    Hx of amputation of lesser toe (HCC)    sees podiatrist   Hyperlipemia    IBS (irritable bowel syndrome)    Insomnia    takes Elavil nightly   Joint pain    Neuropathy    Neuropathy    Osteomyelitis (HCC)    Pneumonia    89/2/22- patient denies   PONV (postoperative nausea and vomiting)    medication did not help with surgery on 03/28/20   Seasonal allergies    takes Zyrtec daily   Sinus tachycardia    Past Surgical History:  Procedure Laterality Date   AMPUTATION  12/28/2011   Procedure: AMPUTATION DIGIT;  Surgeon: Nadara Mustard, MD;  Location: [facility] OR;  Service: Orthopedics;  Laterality: Right;  Right Foot 2nd Toe Amputation at MTP (metatarsophalangeal joint)   AMPUTATION Right 04/25/2012   Procedure: Right Foot 3rd Toe Amputation;  Surgeon: Nadara Mustard, MD;  Location: [facility] OR;  Service: Orthopedics;  Laterality: Right;  Right Foot Third Toe Amputation    AMPUTATION Right 07/27/2012   Procedure: Right 4th Toe Amputation at Metatarsophalangeal;  Surgeon: Nadara Mustard, MD;  Location: [facility] OR;  Service: Orthopedics;  Laterality: Right;  Right 4th Toe Amputation at Metatarsophalangeal   AMPUTATION Left 07/15/2016   Procedure: Left 2nd Ray Amputation;  Surgeon: Nadara Mustard, MD;  Location: [facility] OR;  Service: Orthopedics;  Laterality: Left;   AMPUTATION Left 11/18/2016    Procedure: Left 3rd and 4th Ray Amputation;  Surgeon: Nadara Mustard, MD;  Location: [facility] OR;  Service: Orthopedics;  Laterality: Left;   AMPUTATION Right 03/29/2017   Procedure: RIGHT FOOT 3RD AND 4TH RAY AMPUTATION;  Surgeon: Nadara Mustard, MD;  Location: [facility] OR;  Service: Orthopedics;  Laterality: Right;   AMPUTATION Left 08/12/2020   Procedure: LEFT TRANSMETATARSAL AMPUTATION;  Surgeon: Nadara Mustard, MD;  Location: [facility] OR;  Service: Orthopedics;  Laterality: Left;   APPLICATION OF WOUND VAC Left 08/12/2020  Procedure: APPLICATION OF WOUND VAC;  Surgeon: Nadara Mustard, MD;  Location: [facility] OR;  Service: Orthopedics;  Laterality: Left;   COLONOSCOPY     LAPAROSCOPIC APPENDECTOMY  01/05/2011   Procedure: APPENDECTOMY LAPAROSCOPIC;  Surgeon: Valarie Merino, MD;  Location: [facility] ORS;  Service: General;  Laterality: N/A;   OOPHORECTOMY  2001   ROTATOR CUFF REPAIR Right    x 2   TUBAL LIGATION     VAGINAL HYSTERECTOMY  2001   Social History   Socioeconomic History   Marital status: Married    Spouse name: Not on file   Number of children: 2   Years of education: Not on file   Highest education level: Not on file  Occupational History   Occupation: Retired   Tobacco Use   Smoking status: Never   Smokeless tobacco: Never  Vaping Use   Vaping status: Never Used  Substance and Sexual Activity   Alcohol use: Never   Drug use: Never   Sexual activity: Yes    Birth control/protection: Surgical  Other Topics Concern   Not on file  Social History Narrative   Caffeine daily    [employer], [employer] no diploma   Married '66   1 dtr- '78; 1 son '71; 2 grandchildren   Occupation: retired 04   Dad with alzheimers-had to place in [place] (summer '10)         Social Determinants of Corporate investment banker Strain: Not on Ship broker Insecurity: Not on file  Transportation Needs: Not on file  Physical Activity: Not on file  Stress: Not on file  Social Connections: Not on file  Intimate Partner  Violence: Not on file   Current Outpatient Medications on File Prior to Visit  Medication Sig Dispense Refill   acetaminophen (TYLENOL) 500 MG tablet Take 500 mg by mouth every 8 (eight) hours as needed for mild pain.      azelastine (ASTELIN) 0.1 % nasal spray Place 2 sprays into both nostrils 2 (two) times daily. 30 mL 12   betamethasone dipropionate (DIPROLENE) 0.05 % cream Apply 1 application topically 2 (two) times daily as needed (IRRITATION). 30 g 0   Blood Glucose Monitoring Suppl (ONE TOUCH ULTRA 2) w/Device KIT Use to check blood sugar 1 kit 0   Calcium Carb-Cholecalciferol (CALCIUM + VITAMIN D3) 600-10 MG-MCG TABS Take 2 tablets by mouth daily. 90 tablet 3   cetirizine (ZYRTEC) 10 MG tablet Take 1 tablet (10 mg total) by mouth daily. 30 tablet 11   clotrimazole (CLOTRIMAZOLE AF) 1 % cream Apply 1 Application topically 2 (two) times daily. 35.4 g 2   ferrous sulfate 325 (65 FE) MG tablet Take by mouth.     Fluocinolone Acetonide 0.01 % OIL Place 1-2 drops into both ears daily. As needed 20 mL 0   gabapentin (NEURONTIN) 100 MG capsule TAKE 2 TO 3 CAPSULES AT    BEDTIME 180 capsule 5   glucagon (GLUCAGEN) 1 MG SOLR injection Inject 1 mg into the muscle once as needed for up to 1 dose for low blood sugar. 1 each 11   glucose blood (ONETOUCH ULTRA) test strip USE 1 STRIP TO CHECK GLUCOSE 3-4 TIMES DAILY 400 each 3   insulin degludec (TRESIBA FLEXTOUCH) 200 UNIT/ML FlexTouch Pen Inject 34 Units into the skin daily. 12 mL 3   Insulin Pen Needle 32G X 4 MM MISC Use 1x a day 100 each 3   insulin regular (NOVOLIN R RELION) 100 units/mL injection  Inject 0.2-0.3 mLs (20-30 Units total) into the skin 3 (three) times daily before meals. Per sliding scale     Insulin Syringe-Needle U-100 (RELION INSULIN SYRINGE 1ML/31G) 31G X 5/16" 1 ML MISC USE 2 TIMES A DAY 100 each 2   irbesartan-hydrochlorothiazide (AVALIDE) 150-12.5 MG tablet Take 1 tablet by mouth daily. 90 tablet 3   metFORMIN (GLUCOPHAGE)  1000 MG tablet TAKE 1 TABLET TWICE DAILY  WITH MEALS 180 tablet 3   metoprolol succinate (TOPROL-XL) 25 MG 24 hr tablet Take 1 tablet (25 mg total) by mouth daily. 90 tablet 3   omeprazole (PRILOSEC) 40 MG capsule TAKE 1 CAPSULE TWICE DAILY 180 capsule 3   OVER THE COUNTER MEDICATION Apply 1-3 application topically at bedtime as needed (foot pain). TOPRICIN FOOT CREAM - NEUROPATHY FOOT CREAM **APPLIES TO BOTH FEET AT BEDTIME**     Probiotic Product (PROBIOTIC DAILY PO) Take 1 capsule by mouth daily.     rosuvastatin (CRESTOR) 20 MG tablet TAKE 1 TABLET DAILY 90 tablet 3   silver sulfADIAZINE (SILVADENE) 1 % cream Apply 1 Application topically daily. 50 g 0   tirzepatide (MOUNJARO) 5 MG/0.5ML Pen Inject 5 mg into the skin once a week. 6 mL 0   vitamin B-12 (CYANOCOBALAMIN) 1000 MCG tablet Take 1,000 mcg daily by mouth.     vitamin C (ASCORBIC ACID) 500 MG tablet Take 500 mg by mouth daily.     No current facility-administered medications on file prior to visit.   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   Codeine Other (See Comments)    Makes her crazy   Propoxyphene Hcl Itching    *DARVOCET    Family History  Problem Relation Age of Onset   Lung cancer Mother 73       smoked heavily   Emphysema Mother    Hypertension Father    Hyperlipidemia Father    Diabetes Father    Coronary artery disease Father    Dementia Father    COPD Father        smoked   Stomach cancer Paternal Aunt    Brain cancer Paternal Uncle    Irritable bowel syndrome Other        Several family members on fathers side    Diabetes Other    Stomach cancer Maternal Aunt    Lung cancer Paternal Uncle    Heart disease Paternal Uncle    Colon cancer Neg Hx     Objective:   Physical Exam BP 110/68 (BP Location: Left Arm, Patient Position: Sitting, Cuff Size: Large)   Pulse 69   Ht 5\' 6"  (1.676 m)   Wt 184 lb (83.5 kg)   SpO2 99%   BMI 29.70 kg/m   Wt  Readings from Last 3 Encounters:  11/16/22 184 lb (83.5 kg)  11/16/22 183 lb 6.4 oz (83.2 kg)  11/01/22 192 lb (87.1 kg)   Constitutional: overweight, in NAD Eyes: EOMI, no exophthalmos ENT: no thyromegaly, no cervical lymphadenopathy Cardiovascular: RRR, No MRG Respiratory: CTA B Musculoskeletal: + deformities (several amputations of toes, Charcot deformity of the left foot) Skin: no rashes Neurological: no tremor with outstretched hands  Assessment:     1. DM2, uncontrolled, insulin-dependent, with complications: - diabetic peripheral neuropathy - 3 toe amputations - after infected diabetic toe ulcers >> OM: R 2nd toe amputated on 12/28/2011 R 3rd toe amputated on 04/25/2012 R 4th toe amputated on 07/27/2012 L 2nd toe  amputated on 07/15/2016 L1st and 5th toes amputated 11/18/2016 L transmetatarsal amputation 08/12/2020    2. 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 HL  Plan:     1. DM2 - pt with longstanding, uncontrolled, type 2 diabetes, on basal-bolus insulin regimen (with Evaristo Bury restarted at last visit), also metformin and weekly GLP-1/GIP receptor agonist.  At last visit, HbA1c was improved, at 6.5%.  However sugars were still slightly high, occasionally above 200s in the morning.  Upon questioning, she was off [place] and we discussed about restarting it at a lower dose.  I recommended to start at 12 units and increase as needed.  We continued the rest of the regimen. -At today's visit, sugars are very above, fluctuating mostly within the target range but with occasional hyperglycemic spikes in the 200s and also occasional lows.  Upon questioning, she is taking [place] intermittently, only when sugars are high.  We discussed that this needs to be taken every day.  I recommended to start again at a low dose and increase as needed.  To avoid low blood sugars at night, since she does have occasional sugars in the 70s and the MEN, I advised her to cap her regular insulin dose at 16, rather than 20  units.  As of now, she is having Grapes in the evening and we discussed about possibly stopping these and replacing them with berries. - I advised her to: Patient Instructions  Please continue: - Metformin 1000 mg 2x a day - Mounjaro 5 mg weekly  Insulin Before breakfast Before lunch Before dinner At bedtime  Regular 10-20 - 10-16    Start: - Tresiba 12 units daily and increase the dose by 2-4 units every 4 days until am sugars are <140  Stop Grapes.  Please return in 4 months with your sugar log.  - advised to check sugars at different times of the day - 2x a day, rotating check times - advised for yearly eye exams >> she is UTD - return to clinic in 3-4 months  2. PN -Related to diabetes -She is numbness and tingling mostly in the left big toe -Previously on amitriptyline, on Neurontin >> now off  3. HL -Latest lipid fractions were at mostly goal: 09/21/2022: 122/158/39/56 -She is on Crestor 10 mg daily without side effects   Component     Latest Ref Rng 11/16/2022  Glucose     70 - 99 mg/dL 161 (H)   BUN     6 - 23 mg/dL 24 (H)   Creatinine     0.40 - 1.20 mg/dL 0.96   Sodium     045 - 145 mEq/L 139   Potassium     3.5 - 5.1 mEq/L 4.1   Chloride     96 - 112 mEq/L 101   CO2     19 - 32 mEq/L 28   Calcium     8.4 - 10.5 mg/dL 40.9   GFR     >81.19 mL/min 45.31 (L)   GFR slightly lower.  I will advise her to stay well-hydrated.  Will also forward the results to Dr. Jimmey Ralph.  Carlus Pavlov, MD PhD [facility]

## 2022-11-16 NOTE — Patient Instructions (Addendum)
Please continue: - Metformin 1000 mg 2x a day - Mounjaro 5 mg weekly  Insulin Before breakfast Before lunch Before dinner At bedtime  Regular 10-20 - 10-16    Start: - Tresiba 12 units daily and increase the dose by 2-4 units every 4 days until am sugars are <140  Stop Grapes.  Please return in 4 months with your sugar log.

## 2022-11-16 NOTE — Assessment & Plan Note (Signed)
Stable on Zyrtec

## 2022-11-16 NOTE — Assessment & Plan Note (Signed)
Following with cardiology for this.  She is on Crestor 20 mg daily.

## 2022-11-17 NOTE — Progress Notes (Signed)
Great news! Urine sample is normal.  Shanon Becvar M. Jimmey Ralph, MD 11/17/2022 12:28 PM

## 2022-11-27 ENCOUNTER — Encounter: Payer: Self-pay | Admitting: Orthopedic Surgery

## 2022-11-28 ENCOUNTER — Ambulatory Visit (INDEPENDENT_AMBULATORY_CARE_PROVIDER_SITE_OTHER): Payer: Medicare Other | Admitting: Orthopedic Surgery

## 2022-11-28 ENCOUNTER — Encounter: Payer: Self-pay | Admitting: Orthopedic Surgery

## 2022-11-28 ENCOUNTER — Telehealth: Payer: Self-pay

## 2022-11-28 DIAGNOSIS — Z89432 Acquired absence of left foot: Secondary | ICD-10-CM | POA: Diagnosis not present

## 2022-11-28 DIAGNOSIS — L97511 Non-pressure chronic ulcer of other part of right foot limited to breakdown of skin: Secondary | ICD-10-CM | POA: Diagnosis not present

## 2022-11-28 NOTE — Telephone Encounter (Signed)
Patient called and left Vm on Triage line. States she is having Odor and Swelling and would like to come in today. I saw the message per Autumn F to come in today at 10:45am. I called patient back and scheduled her at 10:45am.

## 2022-11-28 NOTE — Progress Notes (Signed)
Office Visit Note   [name]           Date of Birth: 1945-07-22           MRN: [id number] Visit Date: 11/28/2022              Requested by: Ardith Dark, MD [address] [place],  [place] [address] PCP: Ardith Dark, MD  Chief Complaint  Patient presents with   Right Foot - Pain      HPI: Patient is a 77 year old woman who presents with [name] grade 1 ulcer plantar aspect of the right foot.  Patient states she has noticed increased odor last night.  Patient states she has had increased swelling in her right calf.  Patient states she has been up on her feet more secondary to caring for her husband with end-stage COPD.  Assessment & Plan: Visit Diagnoses:  1. 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HCC)   2. Non-pressure chronic ulcer of other part of right foot limited to breakdown of skin [facility])     Plan: Patient will continue with a felt relieving donut continue with wound care.  Follow-Up Instructions: Return in about 2 weeks (around 12/12/2022).   Ortho Exam  Patient is alert, oriented, no adenopathy, well-dressed, normal affect, normal respiratory effort. Examination there is a new area with a subcutaneous hematoma.  After informed consent a 10 blade knife was used to debride the skin and soft tissue back to healthy viable granulation tissue.  The hematoma was decompressed and there was good healthy tissue in the wound bed.  The wound now measures 2 x 3 cm after debridement.  The patient will continue with the felt relieving donut and routine wound care.  Imaging: No results found. No images are attached to the encounter.  Labs: Lab Results  Component Value Date   HGBA1C 6.7 (H) 09/21/2022   HGBA1C 6.5 07/13/2022   HGBA1C 8.4 (A) 03/02/2022   ESRSEDRATE 51 (H) 12/24/2019   ESRSEDRATE 36 (H) 02/24/2017   CRP 13.2 (H) 12/24/2019   CRP 1.2 (H) 02/24/2017   LABURIC 5.5 09/01/2015   REPTSTATUS 06/03/2020 FINAL 05/29/2020   CULT   05/29/2020    NO GROWTH 5 DAYS Performed at [facility], [address], [place], [place] [address]    LABORGA GROUP B STREP (S.AGALACTIAE) ISOLATED 12/26/2014     Lab Results  Component Value Date   ALBUMIN 4.1 11/05/2021   ALBUMIN 4.1 11/05/2021   ALBUMIN 3.8 09/08/2020   PREALBUMIN 19.4 03/05/2012    No results found for: "MG" No results found for: "VD25OH"  Lab Results  Component Value Date   PREALBUMIN 19.4 03/05/2012      Latest Ref Rng & Units 09/16/2022    7:46 AM 06/16/2022    8:19 AM 12/08/2021    7:59 AM  CBC EXTENDED  WBC 4.0 - 10.5 K/uL 12.4  12.6  13.1   RBC 3.87 - 5.11 MIL/uL 4.11  4.07  4.21   Hemoglobin 12.0 - 15.0 g/dL 82.9  56.2  13.0   HCT 36.0 - 46.0 % 35.7  35.1  37.6   Platelets 150 - 400 K/uL 400  425  368   NEUT# 1.7 - 7.7 K/uL 8.3  8.5  8.6   Lymph# 0.7 - 4.0 K/uL 2.9  3.0  3.4      There is no height or weight on file to calculate BMI.  Orders:  No orders of the  defined types were placed in this encounter.  No orders of the defined types were placed in this encounter.    Procedures: No procedures performed  Clinical Data: No additional findings.  ROS:  All other systems negative, except as noted in the HPI. Review of Systems  Objective: Vital Signs: There were no vitals taken for this visit.  Specialty Comments:  No specialty comments available.  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) 10/23/2019   Nuclear sclerotic cataract of both eyes 10/23/2019   Posterior vitreous detachment of right eye 10/23/2019   Retinal hemorrhage of left eye 10/23/2019   Impingement  syndrome of right shoulder 07/06/2017   Iron deficiency anemia 05/11/2017   Morbid obesity (HCC) 04/24/2017   Anemia 02/24/2017   Baker's cyst 07/10/2016   Onychomycosis 12/07/2015   Upper airway cough syndrome 03/05/2014   Hypertension associated with diabetes (HCC) 12/05/2006   T2DM (type 2 diabetes mellitus) (HCC) 10/12/2006   Dyslipidemia associated with type 2 diabetes mellitus (HCC) 10/12/2006   Allergic rhinitis 10/12/2006   GERD - Followed by Dr. Russella Dar 10/12/2006   IRRITABLE BOWEL SYNDROME - followed by Dr. Russella Dar 10/12/2006   Peripheral neuropathy 10/11/2006   ALOPECIA NEC 10/11/2006   Past Medical History:  Diagnosis Date   Alopecia    Anemia, mild    Arthritis    Chronic cough    sees pulmonologist   Chronic pain    chest wall and abd - s/p extensive eval   Diabetes mellitus with neuropathy (HCC)    sees endocrine   Diverticulosis    Dyspnea    Fatty liver    GERD (gastroesophageal reflux disease)    takes Nexium bid, hx erosive esophagitis   Headache(784.0)    occasionally;r/t sinus    History of colon polyps    HTN (hypertension)    Hx of amputation of lesser toe (HCC)    sees podiatrist   Hyperlipemia    IBS (irritable bowel syndrome)    Insomnia    takes Elavil nightly   Joint pain    Neuropathy    Neuropathy    Osteomyelitis (HCC)    Pneumonia    89/2/22- patient denies   PONV (postoperative nausea and vomiting)    medication did not help with surgery on 03/28/20   Seasonal allergies    takes Zyrtec daily   Sinus tachycardia     Family History  Problem Relation Age of Onset   Lung cancer Mother 30       smoked heavily   Emphysema Mother    Hypertension Father    Hyperlipidemia Father    Diabetes Father    Coronary artery disease Father    Dementia Father    COPD Father        smoked   Stomach cancer Paternal Aunt    Brain cancer Paternal Uncle    Irritable bowel syndrome Other        Several family members on fathers side    Diabetes  Other    Stomach cancer Maternal Aunt    Lung cancer Paternal Uncle    Heart disease Paternal Uncle  Colon cancer Neg Hx     Past Surgical History:  Procedure Laterality Date   AMPUTATION  12/28/2011   Procedure: AMPUTATION DIGIT;  Surgeon: Nadara Mustard, MD;  Location: [facility] OR;  Service: Orthopedics;  Laterality: Right;  Right Foot 2nd Toe Amputation at MTP (metatarsophalangeal joint)   AMPUTATION Right 04/25/2012   Procedure: Right Foot 3rd Toe Amputation;  Surgeon: Nadara Mustard, MD;  Location: [facility] OR;  Service: Orthopedics;  Laterality: Right;  Right Foot Third Toe Amputation    AMPUTATION Right 07/27/2012   Procedure: Right 4th Toe Amputation at Metatarsophalangeal;  Surgeon: Nadara Mustard, MD;  Location: [facility] OR;  Service: Orthopedics;  Laterality: Right;  Right 4th Toe Amputation at Metatarsophalangeal   AMPUTATION Left 07/15/2016   Procedure: Left 2nd Ray Amputation;  Surgeon: Nadara Mustard, MD;  Location: [facility] OR;  Service: Orthopedics;  Laterality: Left;   AMPUTATION Left 11/18/2016   Procedure: Left 3rd and 4th Ray Amputation;  Surgeon: Nadara Mustard, MD;  Location: [facility] OR;  Service: Orthopedics;  Laterality: Left;   AMPUTATION Right 03/29/2017   Procedure: RIGHT FOOT 3RD AND 4TH RAY AMPUTATION;  Surgeon: Nadara Mustard, MD;  Location: [facility] OR;  Service: Orthopedics;  Laterality: Right;   AMPUTATION Left 08/12/2020   Procedure: LEFT TRANSMETATARSAL AMPUTATION;  Surgeon: Nadara Mustard, MD;  Location: [facility] OR;  Service: Orthopedics;  Laterality: Left;   APPLICATION OF WOUND VAC Left 08/12/2020   Procedure: APPLICATION OF WOUND VAC;  Surgeon: Nadara Mustard, MD;  Location: [facility] OR;  Service: Orthopedics;  Laterality: Left;   COLONOSCOPY     LAPAROSCOPIC APPENDECTOMY  01/05/2011   Procedure: APPENDECTOMY LAPAROSCOPIC;  Surgeon: Valarie Merino, MD;  Location: [facility] ORS;  Service: General;  Laterality: N/A;   OOPHORECTOMY  2001   ROTATOR CUFF REPAIR Right    x 2   TUBAL LIGATION     VAGINAL  HYSTERECTOMY  2001   Social History   Occupational History   Occupation: Retired   Tobacco Use   Smoking status: Never   Smokeless tobacco: Never  Vaping Use   Vaping status: Never Used  Substance and Sexual Activity   Alcohol use: Never   Drug use: Never   Sexual activity: Yes    Birth control/protection: Surgical

## 2022-11-30 DIAGNOSIS — H01023 Squamous blepharitis right eye, unspecified eyelid: Secondary | ICD-10-CM | POA: Diagnosis not present

## 2022-11-30 DIAGNOSIS — H43811 Vitreous degeneration, right eye: Secondary | ICD-10-CM | POA: Diagnosis not present

## 2022-11-30 DIAGNOSIS — H25812 Combined forms of age-related cataract, left eye: Secondary | ICD-10-CM | POA: Diagnosis not present

## 2022-11-30 DIAGNOSIS — H01026 Squamous blepharitis left eye, unspecified eyelid: Secondary | ICD-10-CM | POA: Diagnosis not present

## 2022-11-30 DIAGNOSIS — E119 Type 2 diabetes mellitus without complications: Secondary | ICD-10-CM | POA: Diagnosis not present

## 2022-12-05 ENCOUNTER — Ambulatory Visit: Payer: Medicare Other | Admitting: Orthopedic Surgery

## 2022-12-07 DIAGNOSIS — Z23 Encounter for immunization: Secondary | ICD-10-CM | POA: Diagnosis not present

## 2022-12-12 ENCOUNTER — Encounter: Payer: Self-pay | Admitting: Family Medicine

## 2022-12-15 ENCOUNTER — Ambulatory Visit: Payer: Medicare Other | Admitting: Orthopedic Surgery

## 2022-12-15 ENCOUNTER — Encounter: Payer: Self-pay | Admitting: Orthopedic Surgery

## 2022-12-15 DIAGNOSIS — L97511 Non-pressure chronic ulcer of other part of right foot limited to breakdown of skin: Secondary | ICD-10-CM | POA: Diagnosis not present

## 2022-12-15 DIAGNOSIS — Z89432 Acquired absence of left foot: Secondary | ICD-10-CM

## 2022-12-15 NOTE — Progress Notes (Signed)
Office Visit Note   [name]           Date of Birth: 09-25-1945           MRN: [id number] Visit Date: 12/15/2022              Requested by: Ardith Dark, MD [address] 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  Ulcer present greater than 6 weeks.  Assessment & Plan: Visit Diagnoses: No diagnosis found.  Plan: Ulcer was debrided Kerecis micro graft applied dressing instructions provided.  Follow-up in 1 week.  Follow-Up Instructions: No follow-ups on file.   Ortho Exam  Patient is alert, oriented, no adenopathy, well-dressed, normal affect, normal respiratory effort. Examination patient has a chronic wound with nonviable tissue plantar aspect Charcot collapse right foot.  No cellulitis no signs of infection.  The ulcer was debrided with a 10 blade knife.  The wound healing has stalled, the wound bed has healthy granulation tissue, and patient presents for evaluation and application of Kerecis MariGen Micro graft. After informed consent a 10 blade knife was used to debride the skin and soft tissue to healthy viable bleeding granulation tissue.  Silver nitrate was used for hemostasis. The wound measures: 2 cm in length, 3cm  in width, 1 mm in depth, Kerecis MariGen micro tissue graft 4 cm2 was applied, and there was no wastage.  Please see the photo below of the Lot number and expiration date. The micro tissue graft was covered with a nonadherent Adaptic dressing, bolstered with 4 x 4 gauze and secured with a compression wrap.     Imaging: No results found.    Labs: Lab Results  Component Value Date   HGBA1C 6.7 (H) 09/21/2022   HGBA1C 6.5 07/13/2022   HGBA1C 8.4 (A) 03/02/2022   ESRSEDRATE 51 (H)  12/24/2019   ESRSEDRATE 36 (H) 02/24/2017   CRP 13.2 (H) 12/24/2019   CRP 1.2 (H) 02/24/2017   LABURIC 5.5 09/01/2015   REPTSTATUS 06/03/2020 FINAL 05/29/2020   CULT  05/29/2020    NO GROWTH 5 DAYS Performed at [facility], [address] [place], [place] [address]    LABORGA GROUP B STREP (S.AGALACTIAE) ISOLATED 12/26/2014     Lab Results  Component Value Date   ALBUMIN 4.1 11/05/2021   ALBUMIN 4.1 11/05/2021   ALBUMIN 3.8 09/08/2020   PREALBUMIN 19.4 03/05/2012    No results found for: "MG" No results found for: "VD25OH"  Lab Results  Component Value Date   PREALBUMIN 19.4 03/05/2012      Latest Ref Rng & Units 09/16/2022    7:46 AM 06/16/2022    8:19 AM 12/08/2021    7:59 AM  CBC EXTENDED  WBC 4.0 - 10.5 K/uL 12.4  12.6  13.1   RBC 3.87 - 5.11 MIL/uL 4.11  4.07  4.21   Hemoglobin 12.0 - 15.0 g/dL 27.0  62.3  76.2   HCT 36.0 - 46.0 % 35.7  35.1  37.6   Platelets 150 - 400 K/uL 400  425  368   NEUT# 1.7 - 7.7 K/uL 8.3  8.5  8.6   Lymph# 0.7 - 4.0 K/uL 2.9  3.0  3.4  There is no height or weight on file to calculate BMI.  Orders:  No orders of the defined types were placed in this encounter.  No orders of the defined types were placed in this encounter.    Procedures: No procedures performed  Clinical Data: No additional findings.  ROS:  All other systems negative, except as noted in the HPI. Review of Systems  Objective: Vital Signs: There were no vitals taken for this visit.  Specialty Comments:  No specialty comments available.  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) 10/23/2019    Nuclear sclerotic cataract of both eyes 10/23/2019   Posterior vitreous detachment of right eye 10/23/2019   Retinal hemorrhage of left eye 10/23/2019   Impingement syndrome of right shoulder 07/06/2017   Iron deficiency anemia 05/11/2017   Morbid obesity (HCC) 04/24/2017   Anemia 02/24/2017   Baker's cyst 07/10/2016   Onychomycosis 12/07/2015   Upper airway cough syndrome 03/05/2014   Hypertension associated with diabetes (HCC) 12/05/2006   T2DM (type 2 diabetes mellitus) (HCC) 10/12/2006   Dyslipidemia associated with type 2 diabetes mellitus (HCC) 10/12/2006   Allergic rhinitis 10/12/2006   GERD - Followed by Dr. Russella Dar 10/12/2006   IRRITABLE BOWEL SYNDROME - followed by Dr. Russella Dar 10/12/2006   Peripheral neuropathy 10/11/2006   ALOPECIA NEC 10/11/2006   Past Medical History:  Diagnosis Date   Alopecia    Anemia, mild    Arthritis    Chronic cough    sees pulmonologist   Chronic pain    chest wall and abd - s/p extensive eval   Diabetes mellitus with neuropathy (HCC)    sees endocrine   Diverticulosis    Dyspnea    Fatty liver    GERD (gastroesophageal reflux disease)    takes Nexium bid, hx erosive esophagitis   Headache(784.0)    occasionally;r/t sinus    History of colon polyps    HTN (hypertension)    Hx of amputation of lesser toe (HCC)    sees podiatrist   Hyperlipemia    IBS (irritable bowel syndrome)    Insomnia    takes Elavil nightly   Joint pain    Neuropathy    Neuropathy    Osteomyelitis (HCC)    Pneumonia    89/2/22- patient denies   PONV (postoperative nausea and vomiting)    medication did not help with surgery on 03/28/20   Seasonal allergies    takes Zyrtec daily   Sinus tachycardia     Family History  Problem Relation Age of Onset   Lung cancer Mother 80       smoked heavily   Emphysema Mother    Hypertension Father    Hyperlipidemia Father    Diabetes Father    Coronary artery disease Father    Dementia Father    COPD Father         smoked   Stomach cancer Paternal Aunt    Brain cancer Paternal Uncle    Irritable bowel syndrome Other        Several family members on fathers side    Diabetes Other    Stomach cancer Maternal  Aunt    Lung cancer Paternal Uncle    Heart disease Paternal Uncle    Colon cancer Neg Hx     Past Surgical History:  Procedure Laterality Date   AMPUTATION  12/28/2011   Procedure: AMPUTATION DIGIT;  Surgeon: Nadara Mustard, MD;  Location: [facility] OR;  Service: Orthopedics;  Laterality: Right;  Right Foot 2nd Toe Amputation at MTP (metatarsophalangeal joint)   AMPUTATION Right 04/25/2012   Procedure: Right Foot 3rd Toe Amputation;  Surgeon: Nadara Mustard, MD;  Location: [facility] OR;  Service: Orthopedics;  Laterality: Right;  Right Foot Third Toe Amputation    AMPUTATION Right 07/27/2012   Procedure: Right 4th Toe Amputation at Metatarsophalangeal;  Surgeon: Nadara Mustard, MD;  Location: [facility] OR;  Service: Orthopedics;  Laterality: Right;  Right 4th Toe Amputation at Metatarsophalangeal   AMPUTATION Left 07/15/2016   Procedure: Left 2nd Ray Amputation;  Surgeon: Nadara Mustard, MD;  Location: [facility] OR;  Service: Orthopedics;  Laterality: Left;   AMPUTATION Left 11/18/2016   Procedure: Left 3rd and 4th Ray Amputation;  Surgeon: Nadara Mustard, MD;  Location: [facility] OR;  Service: Orthopedics;  Laterality: Left;   AMPUTATION Right 03/29/2017   Procedure: RIGHT FOOT 3RD AND 4TH RAY AMPUTATION;  Surgeon: Nadara Mustard, MD;  Location: [facility] OR;  Service: Orthopedics;  Laterality: Right;   AMPUTATION Left 08/12/2020   Procedure: LEFT TRANSMETATARSAL AMPUTATION;  Surgeon: Nadara Mustard, MD;  Location: [facility] OR;  Service: Orthopedics;  Laterality: Left;   APPLICATION OF WOUND VAC Left 08/12/2020   Procedure: APPLICATION OF WOUND VAC;  Surgeon: Nadara Mustard, MD;  Location: [facility] OR;  Service: Orthopedics;  Laterality: Left;   COLONOSCOPY     LAPAROSCOPIC APPENDECTOMY  01/05/2011   Procedure: APPENDECTOMY LAPAROSCOPIC;  Surgeon: Valarie Merino, MD;  Location: [facility]S;  Service: General;  Laterality: N/A;   OOPHORECTOMY  2001   ROTATOR CUFF REPAIR Right    x 2   TUBAL LIGATION     VAGINAL HYSTERECTOMY  2001   Social History   Occupational History   Occupation: Retired   Tobacco Use   Smoking status: Never   Smokeless tobacco: Never  Vaping Use   Vaping status: Never Used  Substance and Sexual Activity   Alcohol use: Never   Drug use: Never   Sexual activity: Yes    Birth control/protection: Surgical

## 2022-12-16 ENCOUNTER — Telehealth: Payer: Self-pay | Admitting: Family

## 2022-12-16 ENCOUNTER — Inpatient Hospital Stay: Payer: Medicare Other

## 2022-12-16 ENCOUNTER — Telehealth: Payer: Self-pay

## 2022-12-16 ENCOUNTER — Encounter: Payer: Self-pay | Admitting: Oncology

## 2022-12-16 ENCOUNTER — Inpatient Hospital Stay: Payer: Medicare Other | Attending: Oncology | Admitting: Oncology

## 2022-12-16 VITALS — BP 114/59 | HR 73 | Temp 98.2°F | Resp 18 | Ht 66.0 in | Wt 183.3 lb

## 2022-12-16 DIAGNOSIS — I10 Essential (primary) hypertension: Secondary | ICD-10-CM | POA: Diagnosis not present

## 2022-12-16 DIAGNOSIS — D509 Iron deficiency anemia, unspecified: Secondary | ICD-10-CM | POA: Diagnosis not present

## 2022-12-16 DIAGNOSIS — E119 Type 2 diabetes mellitus without complications: Secondary | ICD-10-CM | POA: Insufficient documentation

## 2022-12-16 LAB — CBC WITH DIFFERENTIAL (CANCER CENTER ONLY)
Abs Immature Granulocytes: 0.02 10*3/uL (ref 0.00–0.07)
Basophils Absolute: 0.1 10*3/uL (ref 0.0–0.1)
Basophils Relative: 1 %
Eosinophils Absolute: 0.3 10*3/uL (ref 0.0–0.5)
Eosinophils Relative: 3 %
HCT: 37 % (ref 36.0–46.0)
Hemoglobin: 12 g/dL (ref 12.0–15.0)
Immature Granulocytes: 0 %
Lymphocytes Relative: 26 %
Lymphs Abs: 2.8 10*3/uL (ref 0.7–4.0)
MCH: 28.7 pg (ref 26.0–34.0)
MCHC: 32.4 g/dL (ref 30.0–36.0)
MCV: 88.5 fL (ref 80.0–100.0)
Monocytes Absolute: 0.6 10*3/uL (ref 0.1–1.0)
Monocytes Relative: 6 %
Neutro Abs: 6.8 10*3/uL (ref 1.7–7.7)
Neutrophils Relative %: 64 %
Platelet Count: 417 10*3/uL — ABNORMAL HIGH (ref 150–400)
RBC: 4.18 MIL/uL (ref 3.87–5.11)
RDW: 12.8 % (ref 11.5–15.5)
WBC Count: 10.6 10*3/uL — ABNORMAL HIGH (ref 4.0–10.5)
nRBC: 0 % (ref 0.0–0.2)

## 2022-12-16 LAB — FERRITIN: Ferritin: 155 ng/mL (ref 11–307)

## 2022-12-16 NOTE — Telephone Encounter (Signed)
Pt wasn't sure if the adpatic was supposed to stay on while she did her daily dry dressing changes. I advised yes, that is what is going to keep the fish skin in place on the wound as a barrier. She does have this in place but wanted to be sure.

## 2022-12-16 NOTE — Telephone Encounter (Signed)
-----   Message from Thornton Papas sent at 12/16/2022  3:05 PM EST ----- Please call patient, the ferritin level is normal, follow-up as scheduled

## 2022-12-16 NOTE — Telephone Encounter (Signed)
Pt had questions about skin graft would like a call back

## 2022-12-16 NOTE — Progress Notes (Signed)
[facility] OFFICE PROGRESS NOTE   Diagnosis: Iron deficiency anemia  INTERVAL HISTORY:   Beth Holden returns as scheduled.  She is taking iron.  No bleeding.  She reports constipation.  Constipation is relieved with Dulcolax. She has lost weight since beginning Walls last spring.  She reports right abdominal pain a few days after taking Mounjaro.  She continues follow-up with Dr. Lajoyce Corners for management of a foot ulcer.  Objective:  Vital signs in last 24 hours:  Blood pressure (!) 114/59, pulse 73, temperature 98.2 F (36.8 C), temperature source Temporal, resp. rate 18, height 5\' 6"  (1.676 m), weight 183 lb 4.8 oz (83.1 kg), SpO2 100%.    Lymphatics: No cervical or supraclavicular nodes.  Slight prominence of the left compared to the right submandibular gland Resp: Lungs clear bilaterally, distant breath sounds Cardio: Regular rate and rhythm GI: Nontender, no hepatosplenomegaly, no mass Vascular: No leg edema   Lab Results:  Lab Results  Component Value Date   WBC 10.6 (H) 12/16/2022   HGB 12.0 12/16/2022   HCT 37.0 12/16/2022   MCV 88.5 12/16/2022   PLT 417 (H) 12/16/2022   NEUTROABS 6.8 12/16/2022    CMP  Lab Results  Component Value Date   NA 139 11/16/2022   K 4.1 11/16/2022   CL 101 11/16/2022   CO2 28 11/16/2022   GLUCOSE 119 (H) 11/16/2022   BUN 24 (H) 11/16/2022   CREATININE 1.18 11/16/2022   CALCIUM 10.4 11/16/2022   PROT 7.1 11/05/2021   ALBUMIN 4.1 11/05/2021   ALBUMIN 4.1 11/05/2021   AST 15 11/05/2021   ALT 11 11/05/2021   ALKPHOS 40 11/05/2021   BILITOT 0.4 11/05/2021   GFRNONAA 50 (L) 08/12/2020   GFRAA 78 11/23/2018    No results found for: "CEA1", "CEA", "KGM010", "CA125"  Lab Results  Component Value Date   INR 1.2 05/29/2020   LABPROT 14.7 05/29/2020    Imaging:  No results found.  Medications: I have reviewed the patient's current medications.   Assessment/Plan: Anemia secondary to iron deficiency,  possible GI blood loss related to gastritis status post IV ferrous gluconate weekly x4 beginning 03/13/2017, single dose of IV ferrous gluconate 05/22/2017.  Hemoglobin/MCV corrected into normal range. Feraheme 03/29/2021, 04/15/2021 Upper endoscopy 02/15/2017-diffuse moderate inflammation characterized by congestion, erythema, friability and granularity in the gastric body, posterior wall of the stomach and gastric antrum.  Biopsy GASTRIC ANTRAL MUCOSA WITH NON-SPECIFIC REACTIVE GASTROPATHY AND FEW SUBEPITHELIAL CRYSTALLINE IRON DEPOSITS, CONSISTENT WITH IRON PILL GASTROPATHY. GASTRIC OXYNTIC MUCOSA WITH PARIETAL CELL HYPERPLASIA AS CAN BE SEEN IN HYPERGASTRINEMIC STATES SUCH AS PPI THERAPY. WARTHIN-STARRY STAIN IS NEGATIVE FOR HELICOBACTER PYLORI Upper endoscopy 11/01/2022: Erythematous mucosa in the gastric fundus and body, duodenal biopsies negative for evidence of gluten sensitive enteropathy Colonoscopy 02/15/2017-6 mm polyp in the transverse colon (TUBULAR ADENOMA. NEGATIVE FOR HIGH GRADE DYSPLASIA OR MALIGNANCY) Colonoscopy 11/01/2022: Single angioectasia without bleeding in the ascending colon Diabetes Hypertension Mild neutrophilia- chronic, white count dating at least to 2012 on review of epic chart Small hemoglobin on repeat urinalyses-seen by urology.    Disposition: Beth Holden has a history of iron deficiency anemia.  The hemoglobin remains in the normal range.  She last received IV iron in April 2023.  She continues daily oral iron.  Will follow-up on the ferritin level from today.  She will return for an office and lab visit in 6 months.  There is chronic mild elevation of the platelet count and neutrophils.  This may  be related to the diabetic foot ulcer, iron deficiency, or benign variation.  I have a low clinical suspicion for a myeloproliferative disorder.  Thornton Papas, MD  12/16/2022  8:40 AM

## 2022-12-16 NOTE — Telephone Encounter (Signed)
Patient gave verbal understanding and had no further questions or concerns  

## 2022-12-21 ENCOUNTER — Ambulatory Visit (INDEPENDENT_AMBULATORY_CARE_PROVIDER_SITE_OTHER): Payer: Medicare Other | Admitting: Orthopedic Surgery

## 2022-12-21 ENCOUNTER — Ambulatory Visit: Payer: Medicare Other | Admitting: Family

## 2022-12-21 ENCOUNTER — Encounter: Payer: Self-pay | Admitting: Orthopedic Surgery

## 2022-12-21 DIAGNOSIS — L97511 Non-pressure chronic ulcer of other part of right foot limited to breakdown of skin: Secondary | ICD-10-CM

## 2022-12-21 NOTE — Progress Notes (Signed)
Office Visit Note   [name]           Date of Birth: November 30, 1945           MRN: [id number] Visit Date: 12/21/2022              Requested by: Ardith Dark, MD [address] [place],  [place] [address] PCP: Ardith Dark, MD  Chief Complaint  Patient presents with   Right Foot - Follow-up      HPI: Patient is a 77 year old woman who presents in follow-up for application Kerecis micro for chronic right foot ulcer.  Assessment & Plan: Visit Diagnoses:  1. Non-pressure chronic ulcer of other part of right foot limited to breakdown of skin [facility])     Plan: Patient is quite pleased with her progress.  She will resume Dial soap cleansing dry dressing changes felt relieving donut and an Ace wrap.  Follow-Up Instructions: Return in about 3 weeks (around 01/11/2023).   Ortho Exam  Patient is alert, oriented, no adenopathy, well-dressed, normal affect, normal respiratory effort. Examination patient has excellent improvement in the right foot status post application of 4 cm of Kerecis micro graft.  There is no cellulitis no drainage the wound is flat measures 1 cm in diameter.  There is healthy granulation tissue.  Imaging: No results found.   Labs: Lab Results  Component Value Date   HGBA1C 6.7 (H) 09/21/2022   HGBA1C 6.5 07/13/2022   HGBA1C 8.4 (A) 03/02/2022   ESRSEDRATE 51 (H) 12/24/2019   ESRSEDRATE 36 (H) 02/24/2017   CRP 13.2 (H) 12/24/2019   CRP 1.2 (H) 02/24/2017   LABURIC 5.5 09/01/2015   REPTSTATUS 06/03/2020 FINAL 05/29/2020   CULT  05/29/2020    NO GROWTH 5 DAYS Performed at [facility] Lab, [address], [place], [place] [address]    LABORGA GROUP B STREP (S.AGALACTIAE) ISOLATED 12/26/2014     Lab Results  Component Value Date   ALBUMIN 4.1 11/05/2021   ALBUMIN 4.1 11/05/2021   ALBUMIN 3.8 09/08/2020   PREALBUMIN 19.4 03/05/2012    No results found for: "MG" No results found for: "VD25OH"  Lab Results  Component Value Date    PREALBUMIN 19.4 03/05/2012      Latest Ref Rng & Units 12/16/2022    7:59 AM 09/16/2022    7:46 AM 06/16/2022    8:19 AM  CBC EXTENDED  WBC 4.0 - 10.5 K/uL 10.6  12.4  12.6   RBC 3.87 - 5.11 MIL/uL 4.18  4.11  4.07   Hemoglobin 12.0 - 15.0 g/dL 95.2  84.1  32.4   HCT 36.0 - 46.0 % 37.0  35.7  35.1   Platelets 150 - 400 K/uL 417  400  425   NEUT# 1.7 - 7.7 K/uL 6.8  8.3  8.5   Lymph# 0.7 - 4.0 K/uL 2.8  2.9  3.0      There is no height or weight on file to calculate BMI.  Orders:  No orders of the defined types were placed in this encounter.  No orders of the defined types were placed in this encounter.    Procedures: No procedures performed  Clinical Data: No additional findings.  ROS:  All other systems negative, except as noted in the HPI. Review of Systems  Objective: Vital Signs: There were no vitals taken for this visit.  Specialty Comments:  No specialty comments available.  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) 10/23/2019   Nuclear sclerotic cataract of both eyes 10/23/2019   Posterior vitreous detachment of right eye 10/23/2019   Retinal hemorrhage of left eye 10/23/2019   Impingement syndrome of right shoulder 07/06/2017   Iron deficiency anemia 05/11/2017   Morbid obesity (HCC) 04/24/2017   Anemia 02/24/2017   Baker's cyst 07/10/2016   Onychomycosis 12/07/2015   Upper airway cough syndrome 03/05/2014   Hypertension associated with diabetes (HCC) 12/05/2006   T2DM (type 2 diabetes mellitus) (HCC) 10/12/2006   Dyslipidemia associated with type 2 diabetes mellitus (HCC) 10/12/2006   Allergic rhinitis  10/12/2006   GERD - Followed by Dr. Russella Dar 10/12/2006   IRRITABLE BOWEL SYNDROME - followed by Dr. Russella Dar 10/12/2006   Peripheral neuropathy 10/11/2006   ALOPECIA NEC 10/11/2006   Past Medical History:  Diagnosis Date   Alopecia    Anemia, mild    Arthritis    Chronic cough    sees pulmonologist   Chronic pain    chest wall and abd - s/p extensive eval   Diabetes mellitus with neuropathy (HCC)    sees endocrine   Diverticulosis    Dyspnea    Fatty liver    GERD (gastroesophageal reflux disease)    takes Nexium bid, hx erosive esophagitis   Headache(784.0)    occasionally;r/t sinus    History of colon polyps    HTN (hypertension)    Hx of amputation of lesser toe (HCC)    sees podiatrist   Hyperlipemia    IBS (irritable bowel syndrome)    Insomnia    takes Elavil nightly   Joint pain    Neuropathy    Neuropathy    Osteomyelitis (HCC)    Pneumonia    89/2/22- patient denies   PONV (postoperative nausea and vomiting)    medication did not help with surgery on 03/28/20   Seasonal allergies    takes Zyrtec daily   Sinus tachycardia     Family History  Problem Relation Age of Onset   Lung cancer Mother 31       smoked heavily   Emphysema Mother    Hypertension Father    Hyperlipidemia Father    Diabetes Father    Coronary artery disease Father    Dementia Father    COPD Father        smoked   Stomach cancer Paternal Aunt    Brain cancer Paternal Uncle    Irritable bowel syndrome Other        Several family members on fathers side    Diabetes Other    Stomach cancer Maternal Aunt    Lung cancer Paternal Uncle    Heart disease Paternal Uncle    Colon cancer Neg Hx     Past Surgical History:  Procedure Laterality Date   AMPUTATION  12/28/2011   Procedure: AMPUTATION DIGIT;  Surgeon: Nadara Mustard, MD;  [facility]: [facility] OR;  Service: Orthopedics;  Laterality: Right;  Right Foot 2nd Toe Amputation at MTP (metatarsophalangeal joint)   AMPUTATION Right 04/25/2012    Procedure: Right Foot 3rd Toe Amputation;  Surgeon: Nadara Mustard, MD;  Location: [facility] OR;  Service: Orthopedics;  Laterality:  Right;  Right Foot Third Toe Amputation    AMPUTATION Right 07/27/2012   Procedure: Right 4th Toe Amputation at Metatarsophalangeal;  Surgeon: Nadara Mustard, MD;  Location: [facility] OR;  Service: Orthopedics;  Laterality: Right;  Right 4th Toe Amputation at Metatarsophalangeal   AMPUTATION Left 07/15/2016   Procedure: Left 2nd Ray Amputation;  Surgeon: Nadara Mustard, MD;  Location: [facility] OR;  Service: Orthopedics;  Laterality: Left;   AMPUTATION Left 11/18/2016   Procedure: Left 3rd and 4th Ray Amputation;  Surgeon: Nadara Mustard, MD;  Location: [facility] OR;  Service: Orthopedics;  Laterality: Left;   AMPUTATION Right 03/29/2017   Procedure: RIGHT FOOT 3RD AND 4TH RAY AMPUTATION;  Surgeon: Nadara Mustard, MD;  Location: [facility] OR;  Service: Orthopedics;  Laterality: Right;   AMPUTATION Left 08/12/2020   Procedure: LEFT TRANSMETATARSAL AMPUTATION;  Surgeon: Nadara Mustard, MD;  Location: [facility] OR;  Service: Orthopedics;  Laterality: Left;   APPLICATION OF WOUND VAC Left 08/12/2020   Procedure: APPLICATION OF WOUND VAC;  Surgeon: Nadara Mustard, MD;  Location: [facility] OR;  Service: Orthopedics;  Laterality: Left;   COLONOSCOPY     LAPAROSCOPIC APPENDECTOMY  01/05/2011   Procedure: APPENDECTOMY LAPAROSCOPIC;  Surgeon: Valarie Merino, MD;  Location: [facility] ORS;  Service: General;  Laterality: N/A;   OOPHORECTOMY  2001   ROTATOR CUFF REPAIR Right    x 2   TUBAL LIGATION     VAGINAL HYSTERECTOMY  2001   Social History   Occupational History   Occupation: Retired   Tobacco Use   Smoking status: Never   Smokeless tobacco: Never  Vaping Use   Vaping status: Never Used  Substance and Sexual Activity   Alcohol use: Never   Drug use: Never   Sexual activity: Yes    Birth control/protection: Surgical

## 2022-12-27 DIAGNOSIS — I8311 Varicose veins of right lower extremity with inflammation: Secondary | ICD-10-CM | POA: Diagnosis not present

## 2022-12-27 DIAGNOSIS — L309 Dermatitis, unspecified: Secondary | ICD-10-CM | POA: Diagnosis not present

## 2022-12-27 DIAGNOSIS — I8312 Varicose veins of left lower extremity with inflammation: Secondary | ICD-10-CM | POA: Diagnosis not present

## 2022-12-27 DIAGNOSIS — I872 Venous insufficiency (chronic) (peripheral): Secondary | ICD-10-CM | POA: Diagnosis not present

## 2022-12-30 ENCOUNTER — Ambulatory Visit: Payer: Medicare Other

## 2023-01-12 ENCOUNTER — Ambulatory Visit (INDEPENDENT_AMBULATORY_CARE_PROVIDER_SITE_OTHER): Payer: Medicare Other | Admitting: Orthopedic Surgery

## 2023-01-12 DIAGNOSIS — L97511 Non-pressure chronic ulcer of other part of right foot limited to breakdown of skin: Secondary | ICD-10-CM | POA: Diagnosis not present

## 2023-01-12 DIAGNOSIS — Z89432 Acquired absence of left foot: Secondary | ICD-10-CM

## 2023-01-12 DIAGNOSIS — M79671 Pain in right foot: Secondary | ICD-10-CM | POA: Diagnosis not present

## 2023-01-16 ENCOUNTER — Encounter: Payer: Self-pay | Admitting: Orthopedic Surgery

## 2023-01-16 NOTE — Progress Notes (Signed)
 Office Visit Note   [name]           Date of Birth: 1945/05/17           MRN: [id number] Visit Date: 01/12/2023              Requested by: Kennyth Worth HERO, MD [address] [place],  [place] [address] PCP: Kennyth Worth HERO, MD  Chief Complaint  Patient presents with   Right Foot - Follow-up      HPI: Patient is a 78 year old woman who is seen in follow-up for ulcerations both feet.  She states she has been on her feet more secondary to her husband being under hospice care and her having to care for them.  Assessment & Plan: Visit Diagnoses:  1. Non-pressure chronic ulcer of other part of right foot limited to breakdown of skin (HCC)   2. Pain in right foot   3. 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HCC)     Plan: Ulcer was debrided.  Continue with current wound care and pressure offloading.  Follow-Up Instructions: Return in about 4 weeks (around 02/09/2023).   Ortho Exam  Patient is alert, oriented, no adenopathy, well-dressed, normal affect, normal respiratory effort. Examination The left foot has no ulcers.  Examination right foot there is a persistent ulcer beneath the third metatarsal head.  This is smaller there is healthy granulation tissue.  After informed consent a 10 blade knife was used to debride the skin and soft tissue back to healthy viable granulation tissue.  The bleeding was touched with silver  nitrate.  A sterile dressing was applied.  The wound was 10 x 5 mm after debridement and 1 mm deep. Imaging: No results found.   Labs: Lab Results  Component Value Date   HGBA1C 6.7 (H) 09/21/2022   HGBA1C 6.5 07/13/2022   HGBA1C 8.4 (A) 03/02/2022   ESRSEDRATE 51 (H) 12/24/2019   ESRSEDRATE 36 (H) 02/24/2017   CRP 13.2 (H) 12/24/2019   CRP 1.2 (H) 02/24/2017   LABURIC 5.5 09/01/2015   REPTSTATUS 06/03/2020 FINAL 05/29/2020   CULT  05/29/2020    NO GROWTH 5 DAYS Performed at [facility] Lab, [address]  [place], [place] [address]    LABORGA GROUP B STREP (S.AGALACTIAE) ISOLATED 12/26/2014     Lab Results  Component Value Date   ALBUMIN 4.1 11/05/2021   ALBUMIN 4.1 11/05/2021   ALBUMIN 3.8 09/08/2020   PREALBUMIN 19.4 03/05/2012    No results found for: MG No results found for: [facility]  Lab Results  Component Value Date   PREALBUMIN 19.4 03/05/2012      Latest Ref Rng & Units 12/16/2022    7:59 AM 09/16/2022    7:46 AM 06/16/2022    8:19 AM  CBC EXTENDED  WBC 4.0 - 10.5 K/uL 10.6  12.4  12.6   RBC 3.87 - 5.11 MIL/uL 4.18  4.11  4.07   Hemoglobin 12.0 - 15.0 g/dL 87.9  88.0  88.2   HCT 36.0 - 46.0 % 37.0  35.7  35.1   Platelets 150 - 400 K/uL 417  400  425   NEUT# 1.7 - 7.7 K/uL 6.8  8.3  8.5   Lymph# 0.7 - 4.0 K/uL 2.8  2.9  3.0      There is no height or weight on file to calculate BMI.  Orders:  No orders of the defined types were placed in this encounter.  No orders of the defined  types were placed in this encounter.    Procedures: No procedures performed  Clinical Data: No additional findings.  ROS:  All other systems negative, except as noted in the HPI. Review of Systems  Objective: Vital Signs: There were no vitals taken for this visit.  Specialty Comments:  No specialty comments available.  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) 10/23/2019   Nuclear sclerotic cataract of both eyes 10/23/2019   Posterior vitreous detachment of right eye 10/23/2019   Retinal hemorrhage of left eye 10/23/2019   Impingement syndrome of right shoulder 07/06/2017   Iron deficiency anemia 05/11/2017   Morbid obesity  (HCC) 04/24/2017   Anemia 02/24/2017   Baker's cyst 07/10/2016   Onychomycosis 12/07/2015   Upper airway cough syndrome 03/05/2014   Hypertension associated with diabetes (HCC) 12/05/2006   T2DM (type 2 diabetes mellitus) (HCC) 10/12/2006   Dyslipidemia associated with type 2 diabetes mellitus (HCC) 10/12/2006   Allergic rhinitis 10/12/2006   GERD - Followed by Dr. Aneita 10/12/2006   IRRITABLE BOWEL SYNDROME - followed by Dr. Aneita 10/12/2006   Peripheral neuropathy 10/11/2006   ALOPECIA NEC 10/11/2006   Past Medical History:  Diagnosis Date   Alopecia    Anemia, mild    Arthritis    Chronic cough    sees pulmonologist   Chronic pain    chest wall and abd - s/p extensive eval   Diabetes mellitus with neuropathy (HCC)    sees endocrine   Diverticulosis    Dyspnea    Fatty liver    GERD (gastroesophageal reflux disease)    takes Nexium  bid, hx erosive esophagitis   Headache(784.0)    occasionally;r/t sinus    History of colon polyps    HTN (hypertension)    Hx of amputation of lesser toe (HCC)    sees podiatrist   Hyperlipemia    IBS (irritable bowel syndrome)    Insomnia    takes Elavil  nightly   Joint pain    Neuropathy    Neuropathy    Osteomyelitis (HCC)    Pneumonia    89/2/22- patient denies   PONV (postoperative nausea and vomiting)    medication did not help with surgery on 03/28/20   Seasonal allergies    takes Zyrtec  daily   Sinus tachycardia     Family History  Problem Relation Age of Onset   Lung cancer Mother 5       smoked heavily   Emphysema Mother    Hypertension Father    Hyperlipidemia Father    Diabetes Father    Coronary artery disease Father    Dementia Father    COPD Father        smoked   Stomach cancer Paternal Aunt    Brain cancer Paternal Uncle    Irritable bowel syndrome Other        Several family members on fathers side    Diabetes Other    Stomach cancer Maternal Aunt    Lung cancer Paternal Uncle    Heart disease  Paternal Uncle    Colon cancer Neg Hx     Past Surgical History:  Procedure  Laterality Date   AMPUTATION  12/28/2011   Procedure: AMPUTATION DIGIT;  Surgeon: Jerona LULLA Sage, MD;  Location: [facility] OR;  Service: Orthopedics;  Laterality: Right;  Right Foot 2nd Toe Amputation at MTP (metatarsophalangeal joint)   AMPUTATION Right 04/25/2012   Procedure: Right Foot 3rd Toe Amputation;  Surgeon: Jerona LULLA Sage, MD;  Location: [facility] OR;  Service: Orthopedics;  Laterality: Right;  Right Foot Third Toe Amputation    AMPUTATION Right 07/27/2012   Procedure: Right 4th Toe Amputation at Metatarsophalangeal;  Surgeon: Jerona LULLA Sage, MD;  Location: [facility] OR;  Service: Orthopedics;  Laterality: Right;  Right 4th Toe Amputation at Metatarsophalangeal   AMPUTATION Left 07/15/2016   Procedure: Left 2nd Ray Amputation;  Surgeon: Sage Jerona LULLA, MD;  Location: [facility] OR;  Service: Orthopedics;  Laterality: Left;   AMPUTATION Left 11/18/2016   Procedure: Left 3rd and 4th Ray Amputation;  Surgeon: Sage Jerona LULLA, MD;  Location: [facility] OR;  Service: Orthopedics;  Laterality: Left;   AMPUTATION Right 03/29/2017   Procedure: RIGHT FOOT 3RD AND 4TH RAY AMPUTATION;  Surgeon: Sage Jerona LULLA, MD;  Location: [facility] OR;  Service: Orthopedics;  Laterality: Right;   AMPUTATION Left 08/12/2020   Procedure: LEFT TRANSMETATARSAL AMPUTATION;  Surgeon: Sage Jerona LULLA, MD;  Location: [facility] OR;  Service: Orthopedics;  Laterality: Left;   APPLICATION OF WOUND VAC Left 08/12/2020   Procedure: APPLICATION OF WOUND VAC;  Surgeon: Sage Jerona LULLA, MD;  Location: [facility] OR;  Service: Orthopedics;  Laterality: Left;   COLONOSCOPY     LAPAROSCOPIC APPENDECTOMY  01/05/2011   Procedure: APPENDECTOMY LAPAROSCOPIC;  Surgeon: Donnice KATHEE Lunger, MD;  Location: [facility] ORS;  Service: General;  Laterality: N/A;   OOPHORECTOMY  2001   ROTATOR CUFF REPAIR Right    x 2   TUBAL LIGATION     VAGINAL HYSTERECTOMY  2001   Social History   Occupational History   Occupation: Retired   Tobacco  Use   Smoking status: Never   Smokeless tobacco: Never  Vaping Use   Vaping status: Never Used  Substance and Sexual Activity   Alcohol use: Never   Drug use: Never   Sexual activity: Yes    Birth control/protection: Surgical

## 2023-02-06 ENCOUNTER — Ambulatory Visit (INDEPENDENT_AMBULATORY_CARE_PROVIDER_SITE_OTHER): Payer: Medicare Other | Admitting: Orthopedic Surgery

## 2023-02-06 ENCOUNTER — Encounter: Payer: Self-pay | Admitting: Orthopedic Surgery

## 2023-02-06 DIAGNOSIS — L97511 Non-pressure chronic ulcer of other part of right foot limited to breakdown of skin: Secondary | ICD-10-CM | POA: Diagnosis not present

## 2023-02-06 NOTE — Progress Notes (Signed)
Office Visit Note   [name]           Date of Birth: 01-24-1945           MRN: [id number] Visit Date: 02/06/2023              Requested by: Ardith Dark, MD [address] [place],  [place] [address] PCP: Ardith Dark, MD  Chief Complaint  Patient presents with   Right Foot - Follow-up      HPI: Patient is a 78 year old woman who is seen in follow-up for [facility] grade 1 ulcer plantar aspect right foot.  Patient states she has increased callus.  She states her husband passed away 2023/01/30.  Assessment & Plan: Visit Diagnoses:  1. Non-pressure chronic ulcer of other part of right foot limited to breakdown of skin (HCC)     Plan: Continue with the felt relieving donut continue with the postoperative shoe.  She will bring her extra-depth shoes at follow-up.  Follow-Up Instructions: Return in about 4 weeks (around 03/06/2023).   Ortho Exam  Patient is alert, oriented, no adenopathy, well-dressed, normal affect, normal respiratory effort. Examination patient has increased callus from increased activities recently.  After informed consent a 10 blade knife was used to debride the skin and soft tissue back to healthy viable granulation tissue.  Silver nitrate was used hemostasis.  The ulcer was probed and there is no depth and it does not probe to bone.  The ulcer is 2 cm in diameter after debridement.  A dressing and a felt relieving donut was applied.  Imaging: No results found.   Labs: Lab Results  Component Value Date   HGBA1C 6.7 (H) 09/21/2022   HGBA1C 6.5 07/13/2022   HGBA1C 8.4 (A) 03/02/2022   ESRSEDRATE 51 (H) 12/24/2019   ESRSEDRATE 36 (H) 02/24/2017   CRP 13.2 (H) 12/24/2019   CRP 1.2 (H) 02/24/2017   LABURIC 5.5 09/01/2015   REPTSTATUS 06/03/2020 FINAL 05/29/2020   CULT  05/29/2020    NO GROWTH 5 DAYS Performed at [facility] Lab, [address] [place], [place] [address]    LABORGA GROUP B STREP (S.AGALACTIAE) ISOLATED 12/26/2014      Lab Results  Component Value Date   ALBUMIN 4.1 11/05/2021   ALBUMIN 4.1 11/05/2021   ALBUMIN 3.8 09/08/2020   PREALBUMIN 19.4 03/05/2012    No results found for: "MG" No results found for: "VD25OH"  Lab Results  Component Value Date   PREALBUMIN 19.4 03/05/2012      Latest Ref Rng & Units 12/16/2022    7:59 AM 09/16/2022    7:46 AM 06/16/2022    8:19 AM  CBC EXTENDED  WBC 4.0 - 10.5 K/uL 10.6  12.4  12.6   RBC 3.87 - 5.11 MIL/uL 4.18  4.11  4.07   Hemoglobin 12.0 - 15.0 g/dL 82.9  56.2  13.0   HCT 36.0 - 46.0 % 37.0  35.7  35.1   Platelets 150 - 400 K/uL 417  400  425   NEUT# 1.7 - 7.7 K/uL 6.8  8.3  8.5   Lymph# 0.7 - 4.0 K/uL 2.8  2.9  3.0      There is no height or weight on file to calculate BMI.  Orders:  No orders of the defined types were placed in this encounter.  No orders of the defined types were placed in this encounter.    Procedures: No procedures performed  Clinical Data: No additional findings.  ROS:  All other systems negative, except as noted in the HPI. Review of Systems  Objective: Vital Signs: There were no vitals taken for this visit.  Specialty Comments:  No specialty comments available.  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) 10/23/2019   Nuclear sclerotic cataract of both eyes 10/23/2019   Posterior vitreous detachment of right eye 10/23/2019   Retinal hemorrhage of left eye 10/23/2019   Impingement syndrome of right shoulder 07/06/2017   Iron deficiency anemia 05/11/2017   Morbid obesity (HCC) 04/24/2017   Anemia 02/24/2017   Baker's cyst 07/10/2016   Onychomycosis  12/07/2015   Upper airway cough syndrome 03/05/2014   Hypertension associated with diabetes (HCC) 12/05/2006   T2DM (type 2 diabetes mellitus) (HCC) 10/12/2006   Dyslipidemia associated with type 2 diabetes mellitus (HCC) 10/12/2006   Allergic rhinitis 10/12/2006   GERD - Followed by Dr. Russella Dar 10/12/2006   IRRITABLE BOWEL SYNDROME - followed by Dr. Russella Dar 10/12/2006   Peripheral neuropathy 10/11/2006   ALOPECIA NEC 10/11/2006   Past Medical History:  Diagnosis Date   Alopecia    Anemia, mild    Arthritis    Chronic cough    sees pulmonologist   Chronic pain    chest wall and abd - s/p extensive eval   Diabetes mellitus with neuropathy (HCC)    sees endocrine   Diverticulosis    Dyspnea    Fatty liver    GERD (gastroesophageal reflux disease)    takes Nexium bid, hx erosive esophagitis   Headache(784.0)    occasionally;r/t sinus    History of colon polyps    HTN (hypertension)    Hx of amputation of lesser toe (HCC)    sees podiatrist   Hyperlipemia    IBS (irritable bowel syndrome)    Insomnia    takes Elavil nightly   Joint pain    Neuropathy    Neuropathy    Osteomyelitis (HCC)    Pneumonia    89/2/22- patient denies   PONV (postoperative nausea and vomiting)    medication did not help with surgery on 03/28/20   Seasonal allergies    takes Zyrtec daily   Sinus tachycardia     Family History  Problem Relation Age of Onset   Lung cancer Mother 47       smoked heavily   Emphysema Mother    Hypertension Father    Hyperlipidemia Father    Diabetes Father    Coronary artery disease Father    Dementia Father    COPD Father        smoked   Stomach cancer Paternal Aunt    Brain cancer Paternal Uncle    Irritable bowel syndrome Other        Several family members on fathers side    Diabetes Other    Stomach cancer Maternal Aunt    Lung cancer Paternal Uncle    Heart disease Paternal Uncle    Colon cancer Neg Hx     Past Surgical History:  Procedure  Laterality Date   AMPUTATION  12/28/2011   Procedure: AMPUTATION DIGIT;  Surgeon: Nadara Mustard, MD;  Location:  [facility] OR;  Service: Orthopedics;  Laterality: Right;  Right Foot 2nd Toe Amputation at MTP (metatarsophalangeal joint)   AMPUTATION Right 04/25/2012   Procedure: Right Foot 3rd Toe Amputation;  Surgeon: Nadara Mustard, MD;  Location: [facility] OR;  Service: Orthopedics;  Laterality: Right;  Right Foot Third Toe Amputation    AMPUTATION Right 07/27/2012   Procedure: Right 4th Toe Amputation at Metatarsophalangeal;  Surgeon: Nadara Mustard, MD;  Location: [facility] OR;  Service: Orthopedics;  Laterality: Right;  Right 4th Toe Amputation at Metatarsophalangeal   AMPUTATION Left 07/15/2016   Procedure: Left 2nd Ray Amputation;  Surgeon: Nadara Mustard, MD;  Location: [facility] OR;  Service: Orthopedics;  Laterality: Left;   AMPUTATION Left 11/18/2016   Procedure: Left 3rd and 4th Ray Amputation;  Surgeon: Nadara Mustard, MD;  Location: [facility] OR;  Service: Orthopedics;  Laterality: Left;   AMPUTATION Right 03/29/2017   Procedure: RIGHT FOOT 3RD AND 4TH RAY AMPUTATION;  Surgeon: Nadara Mustard, MD;  Location: [facility] OR;  Service: Orthopedics;  Laterality: Right;   AMPUTATION Left 08/12/2020   Procedure: LEFT TRANSMETATARSAL AMPUTATION;  Surgeon: Nadara Mustard, MD;  Location: [facility] OR;  Service: Orthopedics;  Laterality: Left;   APPLICATION OF WOUND VAC Left 08/12/2020   Procedure: APPLICATION OF WOUND VAC;  Surgeon: Nadara Mustard, MD;  Location: [facility] OR;  Service: Orthopedics;  Laterality: Left;   COLONOSCOPY     LAPAROSCOPIC APPENDECTOMY  01/05/2011   Procedure: APPENDECTOMY LAPAROSCOPIC;  Surgeon: Valarie Merino, MD;  Location: [facility] ORS;  Service: General;  Laterality: N/A;   OOPHORECTOMY  2001   ROTATOR CUFF REPAIR Right    x 2   TUBAL LIGATION     VAGINAL HYSTERECTOMY  2001   Social History   Occupational History   Occupation: Retired   Tobacco Use   Smoking status: Never   Smokeless tobacco: Never  Vaping Use   Vaping  status: Never Used  Substance and Sexual Activity   Alcohol use: Never   Drug use: Never   Sexual activity: Yes    Birth control/protection: Surgical

## 2023-02-15 DIAGNOSIS — L820 Inflamed seborrheic keratosis: Secondary | ICD-10-CM | POA: Diagnosis not present

## 2023-03-08 DIAGNOSIS — E119 Type 2 diabetes mellitus without complications: Secondary | ICD-10-CM | POA: Diagnosis not present

## 2023-03-08 DIAGNOSIS — H01023 Squamous blepharitis right eye, unspecified eyelid: Secondary | ICD-10-CM | POA: Diagnosis not present

## 2023-03-08 DIAGNOSIS — H01026 Squamous blepharitis left eye, unspecified eyelid: Secondary | ICD-10-CM | POA: Diagnosis not present

## 2023-03-08 DIAGNOSIS — H43811 Vitreous degeneration, right eye: Secondary | ICD-10-CM | POA: Diagnosis not present

## 2023-03-08 DIAGNOSIS — H25813 Combined forms of age-related cataract, bilateral: Secondary | ICD-10-CM | POA: Diagnosis not present

## 2023-03-09 ENCOUNTER — Ambulatory Visit (INDEPENDENT_AMBULATORY_CARE_PROVIDER_SITE_OTHER): Payer: Medicare Other | Admitting: Orthopedic Surgery

## 2023-03-09 DIAGNOSIS — Z89432 Acquired absence of left foot: Secondary | ICD-10-CM

## 2023-03-09 DIAGNOSIS — B351 Tinea unguium: Secondary | ICD-10-CM | POA: Diagnosis not present

## 2023-03-09 DIAGNOSIS — L97521 Non-pressure chronic ulcer of other part of left foot limited to breakdown of skin: Secondary | ICD-10-CM

## 2023-03-09 DIAGNOSIS — L97511 Non-pressure chronic ulcer of other part of right foot limited to breakdown of skin: Secondary | ICD-10-CM

## 2023-03-10 DIAGNOSIS — R072 Precordial pain: Secondary | ICD-10-CM | POA: Diagnosis not present

## 2023-03-10 DIAGNOSIS — R079 Chest pain, unspecified: Secondary | ICD-10-CM | POA: Diagnosis not present

## 2023-03-10 DIAGNOSIS — I10 Essential (primary) hypertension: Secondary | ICD-10-CM | POA: Diagnosis not present

## 2023-03-13 ENCOUNTER — Other Ambulatory Visit: Payer: Self-pay | Admitting: Physician Assistant

## 2023-03-13 ENCOUNTER — Other Ambulatory Visit: Payer: Self-pay | Admitting: Internal Medicine

## 2023-03-17 ENCOUNTER — Telehealth: Payer: Self-pay

## 2023-03-17 DIAGNOSIS — E1165 Type 2 diabetes mellitus with hyperglycemia: Secondary | ICD-10-CM

## 2023-03-17 MED ORDER — ONETOUCH ULTRASOFT LANCETS MISC: 12 refills | Status: AC

## 2023-03-17 MED ORDER — ONETOUCH ULTRA VI STRP: ORAL_STRIP | 5 refills | Status: AC

## 2023-03-17 MED ORDER — ONETOUCH ULTRA 2 W/DEVICE KIT: PACK | 0 refills | Status: AC

## 2023-03-17 NOTE — Telephone Encounter (Signed)
 Requested Prescriptions   Signed Prescriptions Disp Refills   Blood Glucose Monitoring Suppl (ONE TOUCH ULTRA 2) w/Device KIT 1 kit 0    Sig: Use to check blood sugar 3-4 times a day DxCode:E11.9    Authorizing Provider: Carlus Pavlov    Ordering User: Kristen Bushway S   glucose blood (ONETOUCH ULTRA) test strip 400 each 5    Sig: Use to check blood sugar 3-4 times a day DxCode:E11.9    Authorizing Provider: Carlus Pavlov    Ordering User: Ahmod Gillespie S   Lancets (ONETOUCH ULTRASOFT) lancets 100 each 12    Sig: Use to check blood sugar 3-4 times a day DxCode:E11.9    Authorizing Provider: Carlus Pavlov    Ordering User: Pollie Meyer

## 2023-03-18 ENCOUNTER — Encounter: Payer: Self-pay | Admitting: Orthopedic Surgery

## 2023-03-18 NOTE — Progress Notes (Signed)
 Office Visit Note   [name]           Date of Birth: 1945-12-23           MRN: [id number] Visit Date: 03/09/2023              Requested by: Ardith Dark, MD [address] 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  Patient also has painful onychomycotic nails on the right foot.  Assessment & Plan: Visit Diagnoses:  1. Non-pressure chronic ulcer of other part of right foot limited to breakdown of skin (HCC)   2. 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HCC)   3. Ulcer of left foot, limited to breakdown of skin (HCC)     Plan: Nails were trimmed x 2.  Orthotics modified on the right.  Ulcer debrided on the right midfoot.  Follow-Up Instructions: No follow-ups on file.   Ortho Exam  Patient is alert, oriented, no adenopathy, well-dressed, normal affect, normal respiratory effort. Examination of both feet patient has no cellulitis.  She has a stable transmetatarsal amputation on the left.  There is some small calluses that are pared without complication.  Examination the right foot she has a chronic ulcer beneath the third metatarsal head.  After informed consent a 10 blade knife was used to debride the skin and soft tissue back to healthy viable granulation tissue.  There is no exposed bone or tendon.  The ulcer is 2 cm in diameter after debridement.  Patient has onychomycotic nails x 2 in the right foot that she is unable to safely trim on her own and the nails were trimmed x 2 without complication.  Imaging: No results found.   Labs: Lab Results  Component Value Date   HGBA1C 6.7 (H) 09/21/2022   HGBA1C 6.5 07/13/2022   HGBA1C 8.4 (A) 03/02/2022   ESRSEDRATE 51 (H) 12/24/2019   ESRSEDRATE 36 (H) 02/24/2017   CRP 13.2 (H) 12/24/2019   CRP 1.2 (H)  02/24/2017   LABURIC 5.5 09/01/2015   REPTSTATUS 06/03/2020 FINAL 05/29/2020   CULT  05/29/2020    NO GROWTH 5 DAYS Performed at [facility], [address], [place], [place] [address]    LABORGA GROUP B STREP (S.AGALACTIAE) ISOLATED 12/26/2014     Lab Results  Component Value Date   ALBUMIN 4.1 11/05/2021   ALBUMIN 4.1 11/05/2021   ALBUMIN 3.8 09/08/2020   PREALBUMIN 19.4 03/05/2012    No results found for: "MG" No results found for: "VD25OH"  Lab Results  Component Value Date   PREALBUMIN 19.4 03/05/2012      Latest Ref Rng & Units 12/16/2022    7:59 AM 09/16/2022    7:46 AM 06/16/2022    8:19 AM  CBC EXTENDED  WBC 4.0 - 10.5 K/uL 10.6  12.4  12.6   RBC 3.87 - 5.11 MIL/uL 4.18  4.11  4.07   Hemoglobin 12.0 - 15.0 g/dL 86.5  78.4  69.6   HCT 36.0 - 46.0 % 37.0  35.7  35.1   Platelets 150 - 400 K/uL 417  400  425   NEUT# 1.7 - 7.7 K/uL 6.8  8.3  8.5   Lymph# 0.7 - 4.0 K/uL 2.8  2.9  3.0  There is no height or weight on file to calculate BMI.  Orders:  No orders of the defined types were placed in this encounter.  No orders of the defined types were placed in this encounter.    Procedures: No procedures performed  Clinical Data: No additional findings.  ROS:  All other systems negative, except as noted in the HPI. Review of Systems  Objective: Vital Signs: There were no vitals taken for this visit.  Specialty Comments:  No specialty comments available.  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) 10/23/2019   Nuclear sclerotic cataract of both eyes 10/23/2019   Posterior vitreous detachment of  right eye 10/23/2019   Retinal hemorrhage of left eye 10/23/2019   Impingement syndrome of right shoulder 07/06/2017   Iron deficiency anemia 05/11/2017   Morbid obesity (HCC) 04/24/2017   Anemia 02/24/2017   Baker's cyst 07/10/2016   Onychomycosis 12/07/2015   Upper airway cough syndrome 03/05/2014   Hypertension associated with diabetes (HCC) 12/05/2006   T2DM (type 2 diabetes mellitus) (HCC) 10/12/2006   Dyslipidemia associated with type 2 diabetes mellitus (HCC) 10/12/2006   Allergic rhinitis 10/12/2006   GERD - Followed by Dr. Russella Dar 10/12/2006   IRRITABLE BOWEL SYNDROME - followed by Dr. Russella Dar 10/12/2006   Peripheral neuropathy 10/11/2006   ALOPECIA NEC 10/11/2006   Past Medical History:  Diagnosis Date   Alopecia    Anemia, mild    Arthritis    Chronic cough    sees pulmonologist   Chronic pain    chest wall and abd - s/p extensive eval   Diabetes mellitus with neuropathy (HCC)    sees endocrine   Diverticulosis    Dyspnea    Fatty liver    GERD (gastroesophageal reflux disease)    takes Nexium bid, hx erosive esophagitis   Headache(784.0)    occasionally;r/t sinus    History of colon polyps    HTN (hypertension)    Hx of amputation of lesser toe (HCC)    sees podiatrist   Hyperlipemia    IBS (irritable bowel syndrome)    Insomnia    takes Elavil nightly   Joint pain    Neuropathy    Neuropathy    Osteomyelitis (HCC)    Pneumonia    89/2/22- patient denies   PONV (postoperative nausea and vomiting)    medication did not help with surgery on 03/28/20   Seasonal allergies    takes Zyrtec daily   Sinus tachycardia     Family History  Problem Relation Age of Onset   Lung cancer Mother 28       smoked heavily   Emphysema Mother    Hypertension Father    Hyperlipidemia Father    Diabetes Father    Coronary artery disease Father    Dementia Father    COPD Father        smoked   Stomach cancer Paternal Aunt    Brain cancer Paternal Uncle    Irritable  bowel syndrome Other        Several family members on fathers side    Diabetes Other    Stomach cancer Maternal  Aunt    Lung cancer Paternal Uncle    Heart disease Paternal Uncle    Colon cancer Neg Hx     Past Surgical History:  Procedure Laterality Date   AMPUTATION  12/28/2011   Procedure: AMPUTATION DIGIT;  Surgeon: Nadara Mustard, MD;  Location: [facility] OR;  Service: Orthopedics;  Laterality: Right;  Right Foot 2nd Toe Amputation at MTP (metatarsophalangeal joint)   AMPUTATION Right 04/25/2012   Procedure: Right Foot 3rd Toe Amputation;  Surgeon: Nadara Mustard, MD;  Location: [facility] OR;  Service: Orthopedics;  Laterality: Right;  Right Foot Third Toe Amputation    AMPUTATION Right 07/27/2012   Procedure: Right 4th Toe Amputation at Metatarsophalangeal;  Surgeon: Nadara Mustard, MD;  Location: [facility] OR;  Service: Orthopedics;  Laterality: Right;  Right 4th Toe Amputation at Metatarsophalangeal   AMPUTATION Left 07/15/2016   Procedure: Left 2nd Ray Amputation;  Surgeon: Nadara Mustard, MD;  Location: [facility] OR;  Service: Orthopedics;  Laterality: Left;   AMPUTATION Left 11/18/2016   Procedure: Left 3rd and 4th Ray Amputation;  Surgeon: Nadara Mustard, MD;  Location: [facility] OR;  Service: Orthopedics;  Laterality: Left;   AMPUTATION Right 03/29/2017   Procedure: RIGHT FOOT 3RD AND 4TH RAY AMPUTATION;  Surgeon: Nadara Mustard, MD;  Location: [facility] OR;  Service: Orthopedics;  Laterality: Right;   AMPUTATION Left 08/12/2020   Procedure: LEFT TRANSMETATARSAL AMPUTATION;  Surgeon: Nadara Mustard, MD;  Location: [facility];  Service: Orthopedics;  Laterality: Left;   APPLICATION OF WOUND VAC Left 08/12/2020   Procedure: APPLICATION OF WOUND VAC;  Surgeon: Nadara Mustard, MD;  Location: [facility] OR;  Service: Orthopedics;  Laterality: Left;   COLONOSCOPY     LAPAROSCOPIC APPENDECTOMY  01/05/2011   Procedure: APPENDECTOMY LAPAROSCOPIC;  Surgeon: Valarie Merino, MD;  Location: [facility] ORS;  Service: General;  Laterality: N/A;   OOPHORECTOMY   2001   ROTATOR CUFF REPAIR Right    x 2   TUBAL LIGATION     VAGINAL HYSTERECTOMY  2001   Social History   Occupational History   Occupation: Retired   Tobacco Use   Smoking status: Never   Smokeless tobacco: Never  Vaping Use   Vaping status: Never Used  Substance and Sexual Activity   Alcohol use: Never   Drug use: Never   Sexual activity: Yes    Birth control/protection: Surgical

## 2023-03-21 DIAGNOSIS — H2512 Age-related nuclear cataract, left eye: Secondary | ICD-10-CM | POA: Diagnosis not present

## 2023-03-28 DIAGNOSIS — H25811 Combined forms of age-related cataract, right eye: Secondary | ICD-10-CM | POA: Diagnosis not present

## 2023-03-28 DIAGNOSIS — Z961 Presence of intraocular lens: Secondary | ICD-10-CM | POA: Diagnosis not present

## 2023-03-30 DIAGNOSIS — H2511 Age-related nuclear cataract, right eye: Secondary | ICD-10-CM | POA: Diagnosis not present

## 2023-04-02 ENCOUNTER — Encounter: Payer: Self-pay | Admitting: Family Medicine

## 2023-04-03 ENCOUNTER — Telehealth: Payer: Self-pay | Admitting: *Deleted

## 2023-04-03 ENCOUNTER — Encounter: Payer: Self-pay | Admitting: Internal Medicine

## 2023-04-03 ENCOUNTER — Ambulatory Visit (INDEPENDENT_AMBULATORY_CARE_PROVIDER_SITE_OTHER): Payer: Medicare Other | Admitting: Internal Medicine

## 2023-04-03 VITALS — BP 110/60 | HR 59 | Ht 66.0 in | Wt 186.0 lb

## 2023-04-03 DIAGNOSIS — E785 Hyperlipidemia, unspecified: Secondary | ICD-10-CM | POA: Diagnosis not present

## 2023-04-03 DIAGNOSIS — E1169 Type 2 diabetes mellitus with other specified complication: Secondary | ICD-10-CM | POA: Diagnosis not present

## 2023-04-03 DIAGNOSIS — Z794 Long term (current) use of insulin: Secondary | ICD-10-CM | POA: Diagnosis not present

## 2023-04-03 DIAGNOSIS — E1159 Type 2 diabetes mellitus with other circulatory complications: Secondary | ICD-10-CM

## 2023-04-03 DIAGNOSIS — E1165 Type 2 diabetes mellitus with hyperglycemia: Secondary | ICD-10-CM

## 2023-04-03 DIAGNOSIS — G63 Polyneuropathy in diseases classified elsewhere: Secondary | ICD-10-CM | POA: Diagnosis not present

## 2023-04-03 LAB — POCT GLYCOSYLATED HEMOGLOBIN (HGB A1C): Hemoglobin A1C: 6.4 % — AB (ref 4.0–5.6)

## 2023-04-03 MED ORDER — TIRZEPATIDE 5 MG/0.5ML ~~LOC~~ SOAJ: 5.0000 mg | SUBCUTANEOUS | 3 refills | Status: DC

## 2023-04-03 NOTE — Patient Instructions (Addendum)
 Please continue: - Metformin 1000 mg 2x a day  Insulin Before breakfast Before lunch Before dinner At bedtime  Regular 10-16 - 10-16    Restart: - Mounjaro 5 mg weekly  Please return in 4 months with your sugar log.

## 2023-04-03 NOTE — Telephone Encounter (Signed)
 Copied from CRM [id number]. Topic: Clinical - Medical Advice >> Apr 03, 2023 10:45 AM Beth Holden wrote: Reason for CRM: Patient wants to let Dr. Jimmey Ralph know that she does not need a callback from her Mychart message anymore. She said she thinks it was just a pulled muscle and Ibprofen/Tylenol. Patient is feeling a lot better.   Noted  Danahi Reddish,RMA

## 2023-04-03 NOTE — Progress Notes (Signed)
 Subjective:     Patient ID: Beth Holden, female   DOB: [birth year], 78 y.o.   MRN:   HPI Ms. Bergin is a pleasant 78 y.o. 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  Last visit 4 months ago.  Interim history: No nausea, chest pain, increased urination.  She has a little blurry vision after her cataract surgeries, which is improving.  She was off Mounjaro for 4 weeks for the surgeries. She was able to lose 23 pounds immediately after starting Mounjaro.  He continues to have constipation and decreased appetite. On Miralax + ExLax.  She has occasional right upper quadrant pain.  Lipase was normal at 03/10/2023. She has a nonpressure ulcer on the right foot.  She sees Dr. Lajoyce Corners. She lost her husband 01/22/2023 (COPD).  They were married for 58 years.  She is grieving. She relaxed her diet.  Reviewed HbA1c levels: Lab Results  Component Value Date   HGBA1C 6.7 (H) 09/21/2022   HGBA1C 6.5 07/13/2022   HGBA1C 8.4 (A) 03/02/2022   She is on:: - Metformin 1000 mg 2x a day - Tresiba 34 >> 44 units daily >> Off for 3 mo >> 12 units daily Insulin Before breakfast Before lunch Before dinner At bedtime  Regular 25(-30) >> 10-20 >> 10-16 - 25(-30) >> 10-20 >> 10-16   - Mounjaro - [place] approved >> 2.5 >> 5 mg weekly - off for 1 mo for cataract surgeries  She also tried: Farxiga 03/2020 -could not start b/c of  $$$ Levemir 47 units in am and 37 units in pm >> $800 for 3 mo supply Januvia 100 mg daily - $450 for 3 mo Invokana 100 mg - $455 for 3 mo Took Byetta before. We could not use Trulicity as she developed GI symptoms with it. We discussed about starting her on a VGo mechanical pump in the past. She had an appointment with diabetes education but decided not to pursue it. We also tried a sliding scale of regular insulin in the past but she  was not using it. We tried Trulicity but she could not tolerate it due to nausea, diarrhea, constipation, weakness. She was previously on NPH, but changed to [place] in 04/2021.  She checks her sugars 2-4 times a day:  - am:  165-315 >> 99-205, 223 >> 107-241 >> 130-207 >> 90-168 (ave <140) - 2h after b'fast : 218-334 >> 100-168 >> 141-201 >> 130-219 >> 98-160 - prelunch: 212-364 >> 135-259 >> 115-174, 252 >> 92-179, 259 >> n/c - 2-3h after lunch: 192-335, 402 >> 177-309 >> 99-209 >> 69-219 >> n/c - before dinner: 156-345 >> 165-314 >> 82-200 >> 70-168, 206, 365 >> 90-160s - after dinner:  1126, 223-323 >> 141-232, 257 >> 64-289 >> 150-200 - bedtime: 124-297 >> 171-281, 380 >> 86-245 >> 58-201 >> see above - nighttime: 53, 75, 100-245, 373 >> 80-180 >> 66-203, 262 Lowest: 40s x3 (did not eat supper) >> ...  42 (07/2022) >> 42.  Has hypoglycemia awareness in the 70s. Highest: 402 >> ... 365 >> 301.  Meals: - Breakfast: egg, bacon, toast; grits; biscuit; fruit; sometimes skips >> 2 slices of toast + butter - Lunch: 1/2 PB sandwich or soup and yoghurt, sometimes crackers - Dinner: meat + vegetables + some starch - Snacks: fruit   + Mild CKD: BUN  Date Value Ref Range Status  11/16/2022 24 (H) 6 -  23 mg/dL Final   Creatinine, Ser  Date Value Ref Range Status  11/16/2022 1.18 0.40 - 1.20 mg/dL Final   Lab Results  Component Value Date   MICRALBCREAT 1.0 11/16/2022   MICRALBCREAT 2.2 12/19/2019   MICRALBCREAT 1.0 11/23/2018   MICRALBCREAT 0.8 04/15/2013  On irbesartan.  -+ HL; latest lipid panel: 09/21/2022: 122/158/39/56 Lab Results  Component Value Date   CHOL 122 11/05/2021   HDL 39.90 11/05/2021   LDLCALC 50 11/05/2021   TRIG 159.0 (H) 11/05/2021   CHOLHDL 3 11/05/2021  On rosuvastatin 20. She has fatty liver >> was advised to lose weight.  - last eye exam: 03/2023: +mild DR in OU reportedly. Prev. Mild NPDR OU, without macular edema (Dr. Luciana Axe referred him to Dr.  Dione Booze).   -+ Numbness and tingling in feet-previously on amitriptyline, then on Neurontin >> tapered to off.  Last foot exam 09/02/2021.  She sees Dr. Lajoyce Corners. She weaned herself off Neurontin as she felt that the pain in her feet was increased on it.  She still has some pain but improved.    Derm: Dr. Donzetta Starch.   Review of Systems  + see HPI  I reviewed pt's medications, allergies, PMH, social hx, family hx, and changes were documented in the history of present illness. Otherwise, unchanged from my initial visit note.  Past Medical History:  Diagnosis Date   Alopecia    Anemia, mild    Arthritis    Chronic cough    sees pulmonologist   Chronic pain    chest wall and abd - s/p extensive eval   Diabetes mellitus with neuropathy (HCC)    sees endocrine   Diverticulosis    Dyspnea    Fatty liver    GERD (gastroesophageal reflux disease)    takes Nexium bid, hx erosive esophagitis   Headache(784.0)    occasionally;r/t sinus    History of colon polyps    HTN (hypertension)    Hx of amputation of lesser toe (HCC)    sees podiatrist   Hyperlipemia    IBS (irritable bowel syndrome)    Insomnia    takes Elavil nightly   Joint pain    Neuropathy    Neuropathy    Osteomyelitis (HCC)    Pneumonia    89/2/22- patient denies   PONV (postoperative nausea and vomiting)    medication did not help with surgery on 03/28/20   Seasonal allergies    takes Zyrtec daily   Sinus tachycardia    Past Surgical History:  Procedure Laterality Date   AMPUTATION  12/28/2011   Procedure: AMPUTATION DIGIT;  Surgeon: Nadara Mustard, MD;  Location: [facility] OR;  Service: Orthopedics;  Laterality: Right;  Right Foot 2nd Toe Amputation at MTP (metatarsophalangeal joint)   AMPUTATION Right 04/25/2012   Procedure: Right Foot 3rd Toe Amputation;  Surgeon: Nadara Mustard, MD;  Location: [facility] OR;  Service: Orthopedics;  Laterality: Right;  Right Foot Third Toe Amputation    AMPUTATION Right 07/27/2012   Procedure:  Right 4th Toe Amputation at Metatarsophalangeal;  Surgeon: Nadara Mustard, MD;  Location: [facility] OR;  Service: Orthopedics;  Laterality: Right;  Right 4th Toe Amputation at Metatarsophalangeal   AMPUTATION Left 07/15/2016   Procedure: Left 2nd Ray Amputation;  Surgeon: Nadara Mustard, MD;  Location: [facility] OR;  Service: Orthopedics;  Laterality: Left;   AMPUTATION Left 11/18/2016   Procedure: Left 3rd and 4th Ray Amputation;  Surgeon: Nadara Mustard, MD;  Location: [facility] OR;  Service: Orthopedics;  Laterality: Left;   AMPUTATION Right 03/29/2017   Procedure: RIGHT FOOT 3RD AND 4TH RAY AMPUTATION;  Surgeon: Nadara Mustard, MD;  Location: [facility] OR;  Service: Orthopedics;  Laterality: Right;   AMPUTATION Left 08/12/2020   Procedure: LEFT TRANSMETATARSAL AMPUTATION;  Surgeon: Nadara Mustard, MD;  Location: [facility] OR;  Service: Orthopedics;  Laterality: Left;   APPLICATION OF WOUND VAC Left 08/12/2020   Procedure: APPLICATION OF WOUND VAC;  Surgeon: Nadara Mustard, MD;  Location: [facility] OR;  Service: Orthopedics;  Laterality: Left;   COLONOSCOPY     LAPAROSCOPIC APPENDECTOMY  01/05/2011   Procedure: APPENDECTOMY LAPAROSCOPIC;  Surgeon: Valarie Merino, MD;  Location: [facility] ORS;  Service: General;  Laterality: N/A;   OOPHORECTOMY  2001   ROTATOR CUFF REPAIR Right    x 2   TUBAL LIGATION     VAGINAL HYSTERECTOMY  2001   Social History   Socioeconomic History   Marital status: Married    Spouse name: Not on file   Number of children: 2   Years of education: Not on file   Highest education level: Not on file  Occupational History   Occupation: Retired   Tobacco Use   Smoking status: Never   Smokeless tobacco: Never  Vaping Use   Vaping status: Never Used  Substance and Sexual Activity   Alcohol use: Never   Drug use: Never   Sexual activity: Yes    Birth control/protection: Surgical  Other Topics Concern   Not on file  Social History Narrative   Caffeine daily    [employer], [employer] no diploma   Married '66   1 dtr- '78;  1 son '71; 2 grandchildren   Occupation: retired 04   Dad with alzheimers-had to place in [place] (summer '10)         Social Drivers of Corporate investment banker Strain: Not on file  Food Insecurity: Not on file  Transportation Needs: Not on file  Physical Activity: Not on file  Stress: Not on file  Social Connections: Not on file  Intimate Partner Violence: Not At Risk (03/10/2023)   Received from [facility]   HITS    Over the last 12 months how often did your partner physically hurt you?: Never    Over the last 12 months how often did your partner insult you or talk down to you?: Never    Over the last 12 months how often did your partner threaten you with physical harm?: Never    Over the last 12 months how often did your partner scream or curse at you?: Never   Current Outpatient Medications on File Prior to Visit  Medication Sig Dispense Refill   acetaminophen (TYLENOL) 500 MG tablet Take 500 mg by mouth every 8 (eight) hours as needed for mild pain.      azelastine (ASTELIN) 0.1 % nasal spray Place 2 sprays into both nostrils 2 (two) times daily. 30 mL 12   betamethasone dipropionate (DIPROLENE) 0.05 % cream Apply 1 application topically 2 (two) times daily as needed (IRRITATION). 30 g 0   Blood Glucose Monitoring Suppl (ONE TOUCH ULTRA 2) w/Device KIT Use to check blood sugar 3-4 times a day DxCode:E11.9 1 kit 0   Calcium Carb-Cholecalciferol (CALCIUM + VITAMIN D3) 600-10 MG-MCG TABS Take 2 tablets by mouth daily. (Patient not taking: Reported on 12/16/2022) 90 tablet 3   cetirizine (ZYRTEC) 10 MG tablet Take 1 tablet (10 mg total) by mouth daily. 90  tablet 3   clotrimazole (CLOTRIMAZOLE AF) 1 % cream Apply 1 Application topically 2 (two) times daily. 35.4 g 2   ferrous sulfate 325 (65 FE) MG tablet Take by mouth. (Patient not taking: Reported on 12/16/2022)     Fluocinolone Acetonide 0.01 % OIL Place 1-2 drops into both ears daily. As needed 20 mL 0   gabapentin (NEURONTIN) 100  MG capsule TAKE 2 TO 3 CAPSULES AT    BEDTIME 180 capsule 5   glucagon (GLUCAGEN) 1 MG SOLR injection Inject 1 mg into the muscle once as needed for up to 1 dose for low blood sugar. 1 each 11   glucose blood (ONETOUCH ULTRA) test strip Use to check blood sugar 3-4 times a day DxCode:E11.9 400 each 5   insulin degludec (TRESIBA FLEXTOUCH) 200 UNIT/ML FlexTouch Pen Inject 34 Units into the skin daily. 12 mL 3   Insulin Pen Needle 32G X 4 MM MISC Use 1x a day 100 each 3   insulin regular (NOVOLIN R RELION) 100 units/mL injection Inject 0.2-0.3 mLs (20-30 Units total) into the skin 3 (three) times daily before meals. Per sliding scale     Insulin Syringe-Needle U-100 (RELION INSULIN SYRINGE 1ML/31G) 31G X 5/16" 1 ML MISC USE 2 TIMES A DAY 100 each 2   irbesartan-hydrochlorothiazide (AVALIDE) 150-12.5 MG tablet Take 1 tablet by mouth daily. 90 tablet 3   Lancets (ONETOUCH ULTRASOFT) lancets Use to check blood sugar 3-4 times a day DxCode:E11.9 100 each 12   metFORMIN (GLUCOPHAGE) 1000 MG tablet TAKE 1 TABLET TWICE DAILY  WITH MEALS 180 tablet 3   metoprolol succinate (TOPROL-XL) 25 MG 24 hr tablet Take 1 tablet (25 mg total) by mouth daily. 90 tablet 3   omeprazole (PRILOSEC) 40 MG capsule TAKE 1 CAPSULE TWICE DAILY 180 capsule 3   OVER THE COUNTER MEDICATION Apply 1-3 application  topically at bedtime as needed (foot pain). TOPRICIN FOOT CREAM - NEUROPATHY FOOT CREAM **APPLIES TO BOTH FEET AT BEDTIME**     Probiotic Product (PROBIOTIC DAILY PO) Take 1 capsule by mouth daily. (Patient not taking: Reported on 12/16/2022)     rosuvastatin (CRESTOR) 20 MG tablet TAKE 1 TABLET DAILY 90 tablet 1   silver sulfADIAZINE (SILVADENE) 1 % cream Apply 1 Application topically daily. 50 g 0   tirzepatide (MOUNJARO) 5 MG/0.5ML Pen Inject 5 mg into the skin once a week. 6 mL 0   vitamin B-12 (CYANOCOBALAMIN) 1000 MCG tablet Take 1,000 mcg daily by mouth. (Patient not taking: Reported on 12/16/2022)     vitamin C  (ASCORBIC ACID) 500 MG tablet Take 500 mg by mouth daily. (Patient not taking: Reported on 12/16/2022)     No current facility-administered medications on file prior to visit.   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   Codeine Other (See Comments)    Makes her crazy   Propoxyphene Hcl Itching    *DARVOCET    Family History  Problem Relation Age of Onset   Lung cancer Mother 66       smoked heavily   Emphysema Mother    Hypertension Father    Hyperlipidemia Father    Diabetes Father    Coronary artery disease Father    Dementia Father    COPD Father        smoked   Stomach cancer Paternal Aunt    Brain cancer Paternal Uncle    Irritable bowel syndrome Other  Several family members on fathers side    Diabetes Other    Stomach cancer Maternal Aunt    Lung cancer Paternal Uncle    Heart disease Paternal Uncle    Colon cancer Neg Hx     Objective:   Physical Exam BP 110/60   Pulse (!) 59   Ht 5\' 6"  (1.676 m)   Wt 186 lb (84.4 kg)   SpO2 98%   BMI 30.02 kg/m   Wt Readings from Last 3 Encounters:  04/03/23 186 lb (84.4 kg)  12/16/22 183 lb 4.8 oz (83.1 kg)  11/16/22 184 lb (83.5 kg)   Constitutional: overweight, in NAD Eyes: EOMI, no exophthalmos ENT: no thyromegaly, no cervical lymphadenopathy Cardiovascular: RRR, No MRG Respiratory: CTA B Musculoskeletal: + deformities (several amputations of toes, Charcot deformity of the left foot) Skin: no rashes Neurological: no tremor with outstretched hands  Assessment:     1. DM2, uncontrolled, insulin-dependent, with complications: - diabetic peripheral neuropathy - 3 toe amputations - after infected diabetic toe ulcers >> OM: R 2nd toe amputated on 12/28/2011 R 3rd toe amputated on 04/25/2012 R 4th toe amputated on 07/27/2012 L 2nd toe amputated on 07/15/2016 L1st and 5th toes amputated 11/18/2016 L transmetatarsal amputation 08/12/2020    2. PN - 2/2  DM  3. HL  Plan:     1. DM2 - pt with longstanding, uncontrolled, type 2 diabetes, on basal-bolus insulin regimen along with metformin and GLP-1/GIP receptor agonist.  HbA1c was slightly higher at last visit, but still at goal, at 6.7%.  Sugars were fluctuating mostly within the target range with occasional hyperglycemic values in the 200s and also occasional lows.  Upon questioning, she was taking [place] intermittently, only when sugars were high.  We discussed about the need to take it every day and I advised her to Her regular insulin dose at 16, rather than 20 units to avoid lows.  She was snacking on grapes in the evening and we discussed about stopping these and replacing them with berries for lower glycemic index snack. -At today's visit, she tells me that she has been more erratic with her diet and her insulin injections since her husband passed away.  However, sugars are much improved compared to before and her HbA1c is also lower.  She tells me that she is not really taking [place] but she does have it at home.  At this point, I do not feel we absolutely need to start it, but I did advise her to start back on Mounjaro.  She has this at home. - I advised her to: Patient Instructions  Please continue: - Metformin 1000 mg 2x a day  Insulin Before breakfast Before lunch Before dinner At bedtime  Regular 10-16 - 10-16    Restart: - Mounjaro 5 mg weekly  Please return in 4 months with your sugar log.  - we checked her HbA1c: 6.4% (lower) - advised to check sugars at different times of the day - 2-3x a day, rotating check times - advised for yearly eye exams >> she is UTD - return to clinic in 4 months  2. PN -Related to diabetes  -She has numbness and tingling mostly in the left big toe -Previously on amitriptyline, on Neurontin >> now off  3. HL -Latest lipid panel was reviewed from 09/2022: Mostly at goal: 122/158/39/56 -She continues on Crestor 20 mg daily without side  effects.  Carlus Pavlov, MD PhD [facility] Endocrinology

## 2023-04-03 NOTE — Telephone Encounter (Signed)
 Noted. She should let us know if she has any change in symptoms.  Katina Degree. Jimmey Ralph, MD 04/03/2023 2:33 PM

## 2023-04-04 NOTE — Telephone Encounter (Signed)
Send information via my chart to patient

## 2023-04-06 ENCOUNTER — Telehealth: Payer: Self-pay | Admitting: Family

## 2023-04-06 NOTE — Telephone Encounter (Signed)
 Coming in tomorrow to see Denny Peon just to check

## 2023-04-06 NOTE — Telephone Encounter (Signed)
 Pt called requesting a call back. Pt states she has a wound and asking if any way she can see Erin Friday morning sometime. Please call pt at [phone]

## 2023-04-07 ENCOUNTER — Telehealth: Payer: Self-pay | Admitting: Family

## 2023-04-07 ENCOUNTER — Encounter: Payer: Self-pay | Admitting: Family

## 2023-04-07 ENCOUNTER — Ambulatory Visit: Admitting: Family

## 2023-04-07 DIAGNOSIS — L02611 Cutaneous abscess of right foot: Secondary | ICD-10-CM

## 2023-04-07 DIAGNOSIS — L97511 Non-pressure chronic ulcer of other part of right foot limited to breakdown of skin: Secondary | ICD-10-CM

## 2023-04-07 DIAGNOSIS — Z89432 Acquired absence of left foot: Secondary | ICD-10-CM | POA: Diagnosis not present

## 2023-04-07 MED ORDER — SULFAMETHOXAZOLE-TRIMETHOPRIM 800-160 MG PO TABS: 1.0000 | ORAL_TABLET | Freq: Two times a day (BID) | ORAL | 0 refills | Status: DC

## 2023-04-07 NOTE — Telephone Encounter (Signed)
 Patient called and wanted to know what to do with her foot. Is it okay to get it wet or not. CB#[phone]

## 2023-04-07 NOTE — Progress Notes (Signed)
 Office Visit Note   [name]           Date of Birth: 12/18/1945           MRN: [id number] Visit Date: 04/07/2023              Requested by: Ardith Dark, MD [address] [place],  [place] [address] PCP: Ardith Dark, MD  Chief Complaint  Patient presents with   Right Foot - Wound Check      HPI: The patient is a 78 year old woman who presents today for concern of worsening to her chronic ulcer right foot.  She has noticed some new green drainage and a foul odor.  Assessment & Plan: Visit Diagnoses: No diagnosis found.  Plan: Patient is placed on a course of Bactrim she will continue daily dose of cleansing of the wound.  She will use a Darco shoe given 1 today for her weightbearing to offload the forefoot  Follow-Up Instructions: Return as scheduled.   Ortho Exam  Patient is alert, oriented, no adenopathy, well-dressed, normal affect, normal respiratory effort. On examination right foot plantar aspect of the central forefoot chronic ulcer, Wagner grade 1 ulcer present there is some surrounding maceration nonviable tissue this was debrided with a 10 blade knife back to viable tissue there was some extension of an abscess which extended distally.  There is underlying viable tissue no ascending cellulitis.  Imaging: No results found.   Labs: Lab Results  Component Value Date   HGBA1C 6.4 (A) 04/03/2023   HGBA1C 6.7 (H) 09/21/2022   HGBA1C 6.5 07/13/2022   ESRSEDRATE 51 (H) 12/24/2019   ESRSEDRATE 36 (H) 02/24/2017   CRP 13.2 (H) 12/24/2019   CRP 1.2 (H) 02/24/2017   LABURIC 5.5 09/01/2015   REPTSTATUS 06/03/2020 FINAL 05/29/2020   CULT  05/29/2020    NO GROWTH 5 DAYS Performed at [facility], [address], [place], [place] [address]    LABORGA GROUP B STREP (S.AGALACTIAE) ISOLATED 12/26/2014     Lab Results  Component Value Date   ALBUMIN 4.1 11/05/2021   ALBUMIN 4.1 11/05/2021   ALBUMIN 3.8 09/08/2020   PREALBUMIN 19.4  03/05/2012    No results found for: "MG" No results found for: "VD25OH"  Lab Results  Component Value Date   PREALBUMIN 19.4 03/05/2012      Latest Ref Rng & Units 12/16/2022    7:59 AM 09/16/2022    7:46 AM 06/16/2022    8:19 AM  CBC EXTENDED  WBC 4.0 - 10.5 K/uL 10.6  12.4  12.6   RBC 3.87 - 5.11 MIL/uL 4.18  4.11  4.07   Hemoglobin 12.0 - 15.0 g/dL 25.3  66.4  40.3   HCT 36.0 - 46.0 % 37.0  35.7  35.1   Platelets 150 - 400 K/uL 417  400  425   NEUT# 1.7 - 7.7 K/uL 6.8  8.3  8.5   Lymph# 0.7 - 4.0 K/uL 2.8  2.9  3.0      There is no height or weight on file to calculate BMI.  Orders:  No orders of the defined types were placed in this encounter.  Meds ordered this encounter  Medications   sulfamethoxazole-trimethoprim (BACTRIM DS) 800-160 MG tablet    Sig: Take 1 tablet by mouth 2 (two) times daily.    Dispense:  20 tablet    Refill:  0     Procedures: No procedures performed  Clinical Data: No additional  findings.  ROS:  All other systems negative, except as noted in the HPI. Review of Systems  Objective: Vital Signs: There were no vitals taken for this visit.  Specialty Comments:  No specialty comments available.  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) 10/23/2019   Nuclear sclerotic cataract of both eyes 10/23/2019   Posterior vitreous detachment of right eye 10/23/2019   Retinal hemorrhage of left eye 10/23/2019   Impingement syndrome of right shoulder 07/06/2017   Iron deficiency anemia 05/11/2017   Morbid obesity (HCC) 04/24/2017   Anemia 02/24/2017   Baker's cyst 07/10/2016   Onychomycosis  12/07/2015   Upper airway cough syndrome 03/05/2014   Hypertension associated with diabetes (HCC) 12/05/2006   T2DM (type 2 diabetes mellitus) (HCC) 10/12/2006   Dyslipidemia associated with type 2 diabetes mellitus (HCC) 10/12/2006   Allergic rhinitis 10/12/2006   GERD - Followed by Dr. Russella Dar 10/12/2006   IRRITABLE BOWEL SYNDROME - followed by Dr. Russella Dar 10/12/2006   Peripheral neuropathy 10/11/2006   ALOPECIA NEC 10/11/2006   Past Medical History:  Diagnosis Date   Alopecia    Anemia, mild    Arthritis    Chronic cough    sees pulmonologist   Chronic pain    chest wall and abd - s/p extensive eval   Diabetes mellitus with neuropathy (HCC)    sees endocrine   Diverticulosis    Dyspnea    Fatty liver    GERD (gastroesophageal reflux disease)    takes Nexium bid, hx erosive esophagitis   Headache(784.0)    occasionally;r/t sinus    History of colon polyps    HTN (hypertension)    Hx of amputation of lesser toe (HCC)    sees podiatrist   Hyperlipemia    IBS (irritable bowel syndrome)    Insomnia    takes Elavil nightly   Joint pain    Neuropathy    Neuropathy    Osteomyelitis (HCC)    Pneumonia    89/2/22- patient denies   PONV (postoperative nausea and vomiting)    medication did not help with surgery on 03/28/20   Seasonal allergies    takes Zyrtec daily   Sinus tachycardia     Family History  Problem Relation Age of Onset   Lung cancer Mother 17       smoked heavily   Emphysema Mother    Hypertension Father    Hyperlipidemia Father    Diabetes Father    Coronary artery disease Father    Dementia Father    COPD Father        smoked   Stomach cancer Paternal Aunt    Brain cancer Paternal Uncle    Irritable bowel syndrome Other        Several family members on fathers side    Diabetes Other    Stomach cancer Maternal Aunt    Lung cancer Paternal Uncle    Heart disease Paternal Uncle    Colon cancer Neg Hx     Past Surgical History:  Procedure  Laterality Date   AMPUTATION  12/28/2011   Procedure: AMPUTATION DIGIT;  Surgeon: Nadara Mustard, MD;  Location: [facility] OR;  Service: Orthopedics;  Laterality: Right;  Right Foot 2nd Toe Amputation at MTP (metatarsophalangeal joint)   AMPUTATION Right 04/25/2012   Procedure: Right Foot 3rd Toe Amputation;  Surgeon: Nadara Mustard, MD;  Location: [facility] OR;  Service: Orthopedics;  Laterality: Right;  Right Foot Third Toe Amputation    AMPUTATION Right 07/27/2012   Procedure: Right 4th Toe Amputation at Metatarsophalangeal;  Surgeon: Nadara Mustard, MD;  Location: [facility] OR;  Service: Orthopedics;  Laterality: Right;  Right 4th Toe Amputation at Metatarsophalangeal   AMPUTATION Left 07/15/2016   Procedure: Left 2nd Ray Amputation;  Surgeon: Nadara Mustard, MD;  Location: [facility] OR;  Service: Orthopedics;  Laterality: Left;   AMPUTATION Left 11/18/2016   Procedure: Left 3rd and 4th Ray Amputation;  Surgeon: Nadara Mustard, MD;  Location: [facility] OR;  Service: Orthopedics;  Laterality: Left;   AMPUTATION Right 03/29/2017   Procedure: RIGHT FOOT 3RD AND 4TH RAY AMPUTATION;  Surgeon: Nadara Mustard, MD;  Location: [facility] OR;  Service: Orthopedics;  Laterality: Right;   AMPUTATION Left 08/12/2020   Procedure: LEFT TRANSMETATARSAL AMPUTATION;  Surgeon: Nadara Mustard, MD;  Location: [facility] OR;  Service: Orthopedics;  Laterality: Left;   APPLICATION OF WOUND VAC Left 08/12/2020   Procedure: APPLICATION OF WOUND VAC;  Surgeon: Nadara Mustard, MD;  Location: [facility] OR;  Service: Orthopedics;  Laterality: Left;   COLONOSCOPY     LAPAROSCOPIC APPENDECTOMY  01/05/2011   Procedure: APPENDECTOMY LAPAROSCOPIC;  Surgeon: Valarie Merino, MD;  Location: [facility] ORS;  Service: General;  Laterality: N/A;   OOPHORECTOMY  2001   ROTATOR CUFF REPAIR Right    x 2   TUBAL LIGATION     VAGINAL HYSTERECTOMY  2001   Social History   Occupational History   Occupation: Retired   Tobacco Use   Smoking status: Never   Smokeless tobacco: Never  Vaping Use   Vaping  status: Never Used  Substance and Sexual Activity   Alcohol use: Never   Drug use: Never   Sexual activity: Yes    Birth control/protection: Surgical

## 2023-04-10 ENCOUNTER — Ambulatory Visit (INDEPENDENT_AMBULATORY_CARE_PROVIDER_SITE_OTHER): Payer: Medicare Other | Admitting: Orthopedic Surgery

## 2023-04-10 DIAGNOSIS — Z89432 Acquired absence of left foot: Secondary | ICD-10-CM

## 2023-04-10 DIAGNOSIS — L97511 Non-pressure chronic ulcer of other part of right foot limited to breakdown of skin: Secondary | ICD-10-CM

## 2023-04-10 NOTE — Telephone Encounter (Signed)
 I was out of the office on Friday. Just now seeing this message this morning. Patient is scheduled in office today at 8:30.

## 2023-04-11 ENCOUNTER — Encounter: Payer: Self-pay | Admitting: Orthopedic Surgery

## 2023-04-11 NOTE — Progress Notes (Signed)
 Vashe dressing changes.  Office Visit Note   [name]           Date of Birth: 01/16/1945           MRN: [id number] Visit Date: 04/10/2023              Requested by: Ardith Dark, MD [address] [place],  [place] [address] PCP: Ardith Dark, MD  Chief Complaint  Patient presents with   Right Foot - Follow-up      HPI: Patient is a 78 year old woman who presents in follow-up for ulcer beneath the right forefoot.  Patient has started Bactrim DS twice a day she is currently using Dial soap cleansing and silver alginate dressings she is currently in a Darco shoe with a felt relieving donut.  Assessment & Plan: Visit Diagnoses:  1. Non-pressure chronic ulcer of other part of right foot limited to breakdown of skin (HCC)   2. 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HCC)     Plan: Will change to a Vashe dressing change daily.  Obtain three-view radiographs of the right foot at follow-up.  Follow-Up Instructions: Return in about 2 weeks (around 04/24/2023).   Ortho Exam  Patient is alert, oriented, no adenopathy, well-dressed, normal affect, normal respiratory effort. Examination the wound is stable there is green staining around the wound edges.  After informed consent a 10 blade knife was used to debride the skin and soft tissue back to healthy viable granulation tissue.  After debridement the wound is 2 x 4 cm.  The wound is superficial without tunneling.  Imaging: No results found.   Labs: Lab Results  Component Value Date   HGBA1C 6.4 (A) 04/03/2023   HGBA1C 6.7 (H) 09/21/2022   HGBA1C 6.5 07/13/2022   ESRSEDRATE 51 (H) 12/24/2019   ESRSEDRATE 36 (H) 02/24/2017   CRP 13.2 (H) 12/24/2019   CRP 1.2 (H) 02/24/2017   LABURIC 5.5 09/01/2015   REPTSTATUS 06/03/2020 FINAL 05/29/2020   CULT  05/29/2020    NO GROWTH 5 DAYS Performed at [facility], [address] [place], [place] [address]    LABORGA GROUP B STREP (S.AGALACTIAE)  ISOLATED 12/26/2014     Lab Results  Component Value Date   ALBUMIN 4.1 11/05/2021   ALBUMIN 4.1 11/05/2021   ALBUMIN 3.8 09/08/2020   PREALBUMIN 19.4 03/05/2012    No results found for: "MG" No results found for: "VD25OH"  Lab Results  Component Value Date   PREALBUMIN 19.4 03/05/2012      Latest Ref Rng & Units 12/16/2022    7:59 AM 09/16/2022    7:46 AM 06/16/2022    8:19 AM  CBC EXTENDED  WBC 4.0 - 10.5 K/uL 10.6  12.4  12.6   RBC 3.87 - 5.11 MIL/uL 4.18  4.11  4.07   Hemoglobin 12.0 - 15.0 g/dL 82.9  56.2  13.0   HCT 36.0 - 46.0 % 37.0  35.7  35.1   Platelets 150 - 400 K/uL 417  400  425   NEUT# 1.7 - 7.7 K/uL 6.8  8.3  8.5   Lymph# 0.7 - 4.0 K/uL 2.8  2.9  3.0      There is no height or weight on file to calculate BMI.  Orders:  No orders of the defined types were placed in this encounter.  No orders of the defined types were placed in this encounter.    Procedures: No procedures performed  Clinical Data: No  additional findings.  ROS:  All other systems negative, except as noted in the HPI. Review of Systems  Objective: Vital Signs: There were no vitals taken for this visit.  Specialty Comments:  No specialty comments available.  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) 10/23/2019   Nuclear sclerotic cataract of both eyes 10/23/2019   Posterior vitreous detachment of right eye 10/23/2019   Retinal hemorrhage of left eye 10/23/2019   Impingement syndrome of right shoulder 07/06/2017   Iron deficiency anemia 05/11/2017   Morbid obesity (HCC) 04/24/2017   Anemia 02/24/2017   Baker's cyst 07/10/2016    Onychomycosis 12/07/2015   Upper airway cough syndrome 03/05/2014   Hypertension associated with diabetes (HCC) 12/05/2006   T2DM (type 2 diabetes mellitus) (HCC) 10/12/2006   Dyslipidemia associated with type 2 diabetes mellitus (HCC) 10/12/2006   Allergic rhinitis 10/12/2006   GERD - Followed by Dr. Russella Dar 10/12/2006   IRRITABLE BOWEL SYNDROME - followed by Dr. Russella Dar 10/12/2006   Peripheral neuropathy 10/11/2006   ALOPECIA NEC 10/11/2006   Past Medical History:  Diagnosis Date   Alopecia    Anemia, mild    Arthritis    Chronic cough    sees pulmonologist   Chronic pain    chest wall and abd - s/p extensive eval   Diabetes mellitus with neuropathy (HCC)    sees endocrine   Diverticulosis    Dyspnea    Fatty liver    GERD (gastroesophageal reflux disease)    takes Nexium bid, hx erosive esophagitis   Headache(784.0)    occasionally;r/t sinus    History of colon polyps    HTN (hypertension)    Hx of amputation of lesser toe (HCC)    sees podiatrist   Hyperlipemia    IBS (irritable bowel syndrome)    Insomnia    takes Elavil nightly   Joint pain    Neuropathy    Neuropathy    Osteomyelitis (HCC)    Pneumonia    89/2/22- patient denies   PONV (postoperative nausea and vomiting)    medication did not help with surgery on 03/28/20   Seasonal allergies    takes Zyrtec daily   Sinus tachycardia     Family History  Problem Relation Age of Onset   Lung cancer Mother 9       smoked heavily   Emphysema Mother    Hypertension Father    Hyperlipidemia Father    Diabetes Father    Coronary artery disease Father    Dementia Father    COPD Father        smoked   Stomach cancer Paternal Aunt    Brain cancer Paternal Uncle    Irritable bowel syndrome Other        Several family members on fathers side    Diabetes Other    Stomach cancer Maternal Aunt    Lung cancer Paternal Uncle    Heart disease Paternal Uncle    Colon cancer Neg Hx     Past Surgical History:   Procedure Laterality Date   AMPUTATION  12/28/2011   Procedure: AMPUTATION DIGIT;  Surgeon: Nadara Mustard,  MD;  Location: [facility] OR;  Service: Orthopedics;  Laterality: Right;  Right Foot 2nd Toe Amputation at MTP (metatarsophalangeal joint)   AMPUTATION Right 04/25/2012   Procedure: Right Foot 3rd Toe Amputation;  Surgeon: Nadara Mustard, MD;  Location: [facility] OR;  Service: Orthopedics;  Laterality: Right;  Right Foot Third Toe Amputation    AMPUTATION Right 07/27/2012   Procedure: Right 4th Toe Amputation at Metatarsophalangeal;  Surgeon: Nadara Mustard, MD;  Location: [facility] OR;  Service: Orthopedics;  Laterality: Right;  Right 4th Toe Amputation at Metatarsophalangeal   AMPUTATION Left 07/15/2016   Procedure: Left 2nd Ray Amputation;  Surgeon: Nadara Mustard, MD;  Location: [facility] OR;  Service: Orthopedics;  Laterality: Left;   AMPUTATION Left 11/18/2016   Procedure: Left 3rd and 4th Ray Amputation;  Surgeon: Nadara Mustard, MD;  Location: [facility] OR;  Service: Orthopedics;  Laterality: Left;   AMPUTATION Right 03/29/2017   Procedure: RIGHT FOOT 3RD AND 4TH RAY AMPUTATION;  Surgeon: Nadara Mustard, MD;  Location: [facility] OR;  Service: Orthopedics;  Laterality: Right;   AMPUTATION Left 08/12/2020   Procedure: LEFT TRANSMETATARSAL AMPUTATION;  Surgeon: Nadara Mustard, MD;  Location: [facility] OR;  Service: Orthopedics;  Laterality: Left;   APPLICATION OF WOUND VAC Left 08/12/2020   Procedure: APPLICATION OF WOUND VAC;  Surgeon: Nadara Mustard, MD;  Location: [facility] OR;  Service: Orthopedics;  Laterality: Left;   COLONOSCOPY     LAPAROSCOPIC APPENDECTOMY  01/05/2011   Procedure: APPENDECTOMY LAPAROSCOPIC;  Surgeon: Valarie Merino, MD;  Location: [facility] ORS;  Service: General;  Laterality: N/A;   OOPHORECTOMY  2001   ROTATOR CUFF REPAIR Right    x 2   TUBAL LIGATION     VAGINAL HYSTERECTOMY  2001   Social History   Occupational History   Occupation: Retired   Tobacco Use   Smoking status: Never   Smokeless tobacco: Never  Vaping  Use   Vaping status: Never Used  Substance and Sexual Activity   Alcohol use: Never   Drug use: Never   Sexual activity: Yes    Birth control/protection: Surgical

## 2023-04-13 DIAGNOSIS — R519 Headache, unspecified: Secondary | ICD-10-CM | POA: Diagnosis not present

## 2023-04-25 ENCOUNTER — Other Ambulatory Visit (INDEPENDENT_AMBULATORY_CARE_PROVIDER_SITE_OTHER)

## 2023-04-25 ENCOUNTER — Ambulatory Visit: Admitting: Orthopedic Surgery

## 2023-04-25 DIAGNOSIS — L97511 Non-pressure chronic ulcer of other part of right foot limited to breakdown of skin: Secondary | ICD-10-CM

## 2023-04-25 MED ORDER — FLUCONAZOLE 150 MG PO TABS: 150.0000 mg | ORAL_TABLET | Freq: Once | ORAL | 0 refills | Status: AC

## 2023-04-25 MED ORDER — CIPROFLOXACIN HCL 500 MG PO TABS: 500.0000 mg | ORAL_TABLET | Freq: Two times a day (BID) | ORAL | 0 refills | Status: DC

## 2023-04-26 ENCOUNTER — Ambulatory Visit
Admission: RE | Admit: 2023-04-26 | Discharge: 2023-04-26 | Disposition: A | Discharge status: Home / Self Care | Source: Ambulatory Visit | Attending: Orthopedic Surgery | Admitting: Orthopedic Surgery

## 2023-04-26 ENCOUNTER — Encounter: Payer: Self-pay | Admitting: Orthopedic Surgery

## 2023-04-26 DIAGNOSIS — M19011 Primary osteoarthritis, right shoulder: Secondary | ICD-10-CM | POA: Diagnosis not present

## 2023-04-26 DIAGNOSIS — Z89421 Acquired absence of other right toe(s): Secondary | ICD-10-CM | POA: Diagnosis not present

## 2023-04-26 DIAGNOSIS — L97511 Non-pressure chronic ulcer of other part of right foot limited to breakdown of skin: Secondary | ICD-10-CM

## 2023-04-26 NOTE — Progress Notes (Signed)
 Office Visit Note   [name]           Date of Birth: Feb 22, 1945           MRN: [id number] Visit Date: 04/25/2023              Requested by: Ardith Dark, MD [address] 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  Patient states she had acute change on Sunday with increased drainage.  Assessment & Plan: Visit Diagnoses:  1. Non-pressure chronic ulcer of other part of right foot limited to breakdown of skin (HCC)     Plan: Will request an MRI scan.  With the chronic ulcer destructive bony changes and green drainage there is a concern for osteomyelitis.  Patient currently is on doxycycline.  Will add a prescription for Cipro and Diflucan.  Follow-up after the MRI scan.  Follow-Up Instructions: Return in about 2 weeks (around 05/09/2023).   Ortho Exam  Patient is alert, oriented, no adenopathy, well-dressed, normal affect, normal respiratory effort. Examination patient has green discoloration on the dressing.  The ulcer is flat with granulation tissue no exposed bone or tendon.  There is no ascending cellulitis.  Imaging: No results found. No images are attached to the encounter.  Labs: Lab Results  Component Value Date   HGBA1C 6.4 (A) 04/03/2023   HGBA1C 6.7 (H) 09/21/2022   HGBA1C 6.5 07/13/2022   ESRSEDRATE 51 (H) 12/24/2019   ESRSEDRATE 36 (H) 02/24/2017   CRP 13.2 (H) 12/24/2019   CRP 1.2 (H) 02/24/2017   LABURIC 5.5 09/01/2015   REPTSTATUS 06/03/2020 FINAL 05/29/2020   CULT  05/29/2020    NO GROWTH 5 DAYS Performed at [facility], [address], [place], [place] [address]    LABORGA GROUP B STREP (S.AGALACTIAE) ISOLATED 12/26/2014     Lab Results  Component Value Date   ALBUMIN 4.1 11/05/2021   ALBUMIN 4.1 11/05/2021   ALBUMIN 3.8  09/08/2020   PREALBUMIN 19.4 03/05/2012    No results found for: "MG" No results found for: "VD25OH"  Lab Results  Component Value Date   PREALBUMIN 19.4 03/05/2012      Latest Ref Rng & Units 12/16/2022    7:59 AM 09/16/2022    7:46 AM 06/16/2022    8:19 AM  CBC EXTENDED  WBC 4.0 - 10.5 K/uL 10.6  12.4  12.6   RBC 3.87 - 5.11 MIL/uL 4.18  4.11  4.07   Hemoglobin 12.0 - 15.0 g/dL 69.6  29.5  28.4   HCT 36.0 - 46.0 % 37.0  35.7  35.1   Platelets 150 - 400 K/uL 417  400  425   NEUT# 1.7 - 7.7 K/uL 6.8  8.3  8.5   Lymph# 0.7 - 4.0 K/uL 2.8  2.9  3.0      There is no height or weight on file to calculate BMI.  Orders:  Orders Placed This Encounter  Procedures   XR Foot Complete Right   MR Foot Right w/o contrast   Meds ordered this encounter  Medications   ciprofloxacin (CIPRO) 500 MG tablet    Sig: Take 1 tablet (500 mg total) by mouth 2 (two) times daily.    Dispense:  20 tablet    Refill:  0  fluconazole (DIFLUCAN) 150 MG tablet    Sig: Take 1 tablet (150 mg total) by mouth once for 1 dose.    Dispense:  3 tablet    Refill:  0     Procedures: No procedures performed  Clinical Data: No additional findings.  ROS:  All other systems negative, except as noted in the HPI. Review of Systems  Objective: Vital Signs: There were no vitals taken for this visit.  Specialty Comments:  No specialty comments available.  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) 10/23/2019   Nuclear sclerotic cataract of both eyes 10/23/2019   Posterior vitreous detachment of right eye 10/23/2019   Retinal hemorrhage of left eye  10/23/2019   Impingement syndrome of right shoulder 07/06/2017   Iron deficiency anemia 05/11/2017   Morbid obesity (HCC) 04/24/2017   Anemia 02/24/2017   Baker's cyst 07/10/2016   Onychomycosis 12/07/2015   Upper airway cough syndrome 03/05/2014   Hypertension associated with diabetes (HCC) 12/05/2006   T2DM (type 2 diabetes mellitus) (HCC) 10/12/2006   Dyslipidemia associated with type 2 diabetes mellitus (HCC) 10/12/2006   Allergic rhinitis 10/12/2006   GERD - Followed by Dr. Russella Dar 10/12/2006   IRRITABLE BOWEL SYNDROME - followed by Dr. Russella Dar 10/12/2006   Peripheral neuropathy 10/11/2006   ALOPECIA NEC 10/11/2006   Past Medical History:  Diagnosis Date   Alopecia    Anemia, mild    Arthritis    Chronic cough    sees pulmonologist   Chronic pain    chest wall and abd - s/p extensive eval   Diabetes mellitus with neuropathy (HCC)    sees endocrine   Diverticulosis    Dyspnea    Fatty liver    GERD (gastroesophageal reflux disease)    takes Nexium bid, hx erosive esophagitis   Headache(784.0)    occasionally;r/t sinus    History of colon polyps    HTN (hypertension)    Hx of amputation of lesser toe (HCC)    sees podiatrist   Hyperlipemia    IBS (irritable bowel syndrome)    Insomnia    takes Elavil nightly   Joint pain    Neuropathy    Neuropathy    Osteomyelitis (HCC)    Pneumonia    89/2/22- patient denies   PONV (postoperative nausea and vomiting)    medication did not help with surgery on 03/28/20   Seasonal allergies    takes Zyrtec daily   Sinus tachycardia     Family History  Problem Relation Age of Onset   Lung cancer Mother 34       smoked heavily   Emphysema Mother    Hypertension Father    Hyperlipidemia Father    Diabetes Father    Coronary artery disease Father    Dementia Father    COPD Father        smoked   Stomach cancer Paternal Aunt    Brain cancer Paternal Uncle    Irritable bowel syndrome Other        Several family members on  fathers side    Diabetes Other    Stomach cancer Maternal Aunt  Lung cancer Paternal Uncle    Heart disease Paternal Uncle    Colon cancer Neg Hx     Past Surgical History:  Procedure Laterality Date   AMPUTATION  12/28/2011   Procedure: AMPUTATION DIGIT;  Surgeon: Timothy Ford, MD;  Location: [facility] OR;  Service: Orthopedics;  Laterality: Right;  Right Foot 2nd Toe Amputation at MTP (metatarsophalangeal joint)   AMPUTATION Right 04/25/2012   Procedure: Right Foot 3rd Toe Amputation;  Surgeon: Timothy Ford, MD;  Location: [facility] OR;  Service: Orthopedics;  Laterality: Right;  Right Foot Third Toe Amputation    AMPUTATION Right 07/27/2012   Procedure: Right 4th Toe Amputation at Metatarsophalangeal;  Surgeon: Timothy Ford, MD;  Location: [facility] OR;  Service: Orthopedics;  Laterality: Right;  Right 4th Toe Amputation at Metatarsophalangeal   AMPUTATION Left 07/15/2016   Procedure: Left 2nd Ray Amputation;  Surgeon: Timothy Ford, MD;  Location: [facility] OR;  Service: Orthopedics;  Laterality: Left;   AMPUTATION Left 11/18/2016   Procedure: Left 3rd and 4th Ray Amputation;  Surgeon: Timothy Ford, MD;  Location: [facility] OR;  Service: Orthopedics;  Laterality: Left;   AMPUTATION Right 03/29/2017   Procedure: RIGHT FOOT 3RD AND 4TH RAY AMPUTATION;  Surgeon: Timothy Ford, MD;  Location: [facility] OR;  Service: Orthopedics;  Laterality: Right;   AMPUTATION Left 08/12/2020   Procedure: LEFT TRANSMETATARSAL AMPUTATION;  Surgeon: Timothy Ford, MD;  Location: [facility] OR;  Service: Orthopedics;  Laterality: Left;   APPLICATION OF WOUND VAC Left 08/12/2020   Procedure: APPLICATION OF WOUND VAC;  Surgeon: Timothy Ford, MD;  Location: [facility] OR;  Service: Orthopedics;  Laterality: Left;   COLONOSCOPY     LAPAROSCOPIC APPENDECTOMY  01/05/2011   Procedure: APPENDECTOMY LAPAROSCOPIC;  Surgeon: Azucena Bollard, MD;  Location: [facility] ORS;  Service: General;  Laterality: N/A;   OOPHORECTOMY  2001   ROTATOR CUFF REPAIR Right    x 2   TUBAL  LIGATION     VAGINAL HYSTERECTOMY  2001   Social History   Occupational History   Occupation: Retired   Tobacco Use   Smoking status: Never   Smokeless tobacco: Never  Vaping Use   Vaping status: Never Used  Substance and Sexual Activity   Alcohol use: Never   Drug use: Never   Sexual activity: Yes    Birth control/protection: Surgical

## 2023-05-03 DIAGNOSIS — L218 Other seborrheic dermatitis: Secondary | ICD-10-CM | POA: Diagnosis not present

## 2023-05-03 DIAGNOSIS — Z85828 Personal history of other malignant neoplasm of skin: Secondary | ICD-10-CM | POA: Diagnosis not present

## 2023-05-09 ENCOUNTER — Ambulatory Visit (INDEPENDENT_AMBULATORY_CARE_PROVIDER_SITE_OTHER): Admitting: Orthopedic Surgery

## 2023-05-09 DIAGNOSIS — L97511 Non-pressure chronic ulcer of other part of right foot limited to breakdown of skin: Secondary | ICD-10-CM | POA: Diagnosis not present

## 2023-05-09 DIAGNOSIS — Z89432 Acquired absence of left foot: Secondary | ICD-10-CM

## 2023-05-12 ENCOUNTER — Encounter: Payer: Self-pay | Admitting: Orthopedic Surgery

## 2023-05-12 NOTE — Progress Notes (Addendum)
 Office Visit Note   [name]           Date of Birth: April 21, 1945           MRN: [id number] Visit Date: 05/09/2023              Requested by: Rodney Clamp, MD [address] [place],  [place] [address] PCP: Rodney Clamp, MD  Chief Complaint  Patient presents with   Right Foot - Wound Check      HPI: Patient is a 78 year old woman who presents in follow-up for both lower extremities.  Patient has been on Cipro  doxycycline  and Diflucan .  MRI scan obtained on April 16.  Assessment & Plan: Visit Diagnoses:  1. Non-pressure chronic ulcer of other part of right foot limited to breakdown of skin (HCC)   2. 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HCC)     Plan: Ulcer debrided continue with pressure offloading and Vashe dressing changes.  Follow-Up Instructions: Return in about 2 weeks (around 05/23/2023).   Ortho Exam  Patient is alert, oriented, no adenopathy, well-dressed, normal affect, normal respiratory effort. Examination the MRI scan shows no definite abscess or osteomyelitis there is some mild edema in the metatarsals.  Patient's ulcer shows improved healing.  After informed consent a 10 blade knife was used to debride the skin and soft tissue back to flat healthy granulation tissue.  The open wound measures 0.5 x 1 cm and 1 mm deep.  There is no exposed bone or tendon no cellulitis.  Imaging: No results found.   Labs: Lab Results  Component Value Date   HGBA1C 6.4 (A) 04/03/2023   HGBA1C 6.7 (H) 09/21/2022   HGBA1C 6.5 07/13/2022   ESRSEDRATE 51 (H) 12/24/2019   ESRSEDRATE 36 (H) 02/24/2017   CRP 13.2 (H) 12/24/2019   CRP 1.2 (H) 02/24/2017   LABURIC 5.5 09/01/2015   REPTSTATUS 06/03/2020 FINAL 05/29/2020   CULT  05/29/2020    NO GROWTH 5 DAYS Performed at [facility], [address] [place], [place] [address]    LABORGA GROUP B STREP (S.AGALACTIAE) ISOLATED 12/26/2014     Lab Results  Component Value Date   ALBUMIN  4.1 11/05/2021   ALBUMIN 4.1 11/05/2021   ALBUMIN 3.8 09/08/2020   PREALBUMIN 19.4 03/05/2012    No results found for: "MG" No results found for: "VD25OH"  Lab Results  Component Value Date   PREALBUMIN 19.4 03/05/2012      Latest Ref Rng & Units 12/16/2022    7:59 AM 09/16/2022    7:46 AM 06/16/2022    8:19 AM  CBC EXTENDED  WBC 4.0 - 10.5 K/uL 10.6  12.4  12.6   RBC 3.87 - 5.11 MIL/uL 4.18  4.11  4.07   Hemoglobin 12.0 - 15.0 g/dL 82.9  56.2  13.0   HCT 36.0 - 46.0 % 37.0  35.7  35.1   Platelets 150 - 400 K/uL 417  400  425   NEUT# 1.7 - 7.7 K/uL 6.8  8.3  8.5   Lymph# 0.7 - 4.0 K/uL 2.8  2.9  3.0      There is no height or weight on file to calculate BMI.  Orders:  No orders of the defined types were placed in this encounter.  No orders of the defined types were placed in this encounter.    Procedures: No procedures performed  Clinical Data: No additional findings.  ROS:  All other systems negative, except as  noted in the HPI. Review of Systems  Objective: Vital Signs: There were no vitals taken for this visit.  Specialty Comments:  No specialty comments available.  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) 10/23/2019   Nuclear sclerotic cataract of both eyes 10/23/2019   Posterior vitreous detachment of right eye 10/23/2019   Retinal hemorrhage of left eye 10/23/2019   Impingement syndrome of right shoulder 07/06/2017   Iron deficiency anemia 05/11/2017   Morbid obesity (HCC) 04/24/2017   Anemia 02/24/2017   Baker's cyst 07/10/2016   Onychomycosis 12/07/2015   Upper airway cough syndrome 03/05/2014    Hypertension associated with diabetes (HCC) 12/05/2006   T2DM (type 2 diabetes mellitus) (HCC) 10/12/2006   Dyslipidemia associated with type 2 diabetes mellitus (HCC) 10/12/2006   Allergic rhinitis 10/12/2006   GERD - Followed by Dr. Sandrea Cruel 10/12/2006   IRRITABLE BOWEL SYNDROME - followed by Dr. Sandrea Cruel 10/12/2006   Peripheral neuropathy 10/11/2006   ALOPECIA NEC 10/11/2006   Past Medical History:  Diagnosis Date   Alopecia    Anemia, mild    Arthritis    Chronic cough    sees pulmonologist   Chronic pain    chest wall and abd - s/p extensive eval   Diabetes mellitus with neuropathy (HCC)    sees endocrine   Diverticulosis    Dyspnea    Fatty liver    GERD (gastroesophageal reflux disease)    takes Nexium  bid, hx erosive esophagitis   Headache(784.0)    occasionally;r/t sinus    History of colon polyps    HTN (hypertension)    Hx of amputation of lesser toe (HCC)    sees podiatrist   Hyperlipemia    IBS (irritable bowel syndrome)    Insomnia    takes Elavil  nightly   Joint pain    Neuropathy    Neuropathy    Osteomyelitis (HCC)    Pneumonia    89/2/22- patient denies   PONV (postoperative nausea and vomiting)    medication did not help with surgery on 03/28/20   Seasonal allergies    takes Zyrtec  daily   Sinus tachycardia     Family History  Problem Relation Age of Onset   Lung cancer Mother 42       smoked heavily   Emphysema Mother    Hypertension Father    Hyperlipidemia Father    Diabetes Father    Coronary artery disease Father    Dementia Father    COPD Father        smoked   Stomach cancer Paternal Aunt    Brain cancer Paternal Uncle    Irritable bowel syndrome Other        Several family members on fathers side    Diabetes Other    Stomach cancer Maternal Aunt    Lung cancer Paternal Uncle    Heart disease Paternal Uncle    Colon cancer Neg Hx     Past Surgical History:  Procedure Laterality Date   AMPUTATION  12/28/2011   Procedure:  AMPUTATION DIGIT;  Surgeon: Timothy Ford, MD;  Location: [facility] OR;  Service: Orthopedics;  Laterality: Right;  Right Foot 2nd Toe Amputation at MTP (metatarsophalangeal joint)   AMPUTATION Right 04/25/2012   Procedure: Right Foot 3rd Toe Amputation;  Surgeon: Timothy Ford, MD;  Location: [facility] OR;  Service: Orthopedics;  Laterality: Right;  Right Foot Third Toe Amputation    AMPUTATION Right 07/27/2012   Procedure: Right 4th Toe Amputation at Metatarsophalangeal;  Surgeon: Timothy Ford, MD;  Location: [facility] OR;  Service: Orthopedics;  Laterality: Right;  Right 4th Toe Amputation at Metatarsophalangeal   AMPUTATION Left 07/15/2016   Procedure: Left 2nd Ray Amputation;  Surgeon: Timothy Ford, MD;  Location: [facility] OR;  Service: Orthopedics;  Laterality: Left;   AMPUTATION Left 11/18/2016   Procedure: Left 3rd and 4th Ray Amputation;  Surgeon: Timothy Ford, MD;  Location: [facility] OR;  Service: Orthopedics;  Laterality: Left;   AMPUTATION Right 03/29/2017   Procedure: RIGHT FOOT 3RD AND 4TH RAY AMPUTATION;  Surgeon: Timothy Ford, MD;  Location: [facility] OR;  Service: Orthopedics;  Laterality: Right;   AMPUTATION Left 08/12/2020   Procedure: LEFT TRANSMETATARSAL AMPUTATION;  Surgeon: Timothy Ford, MD;  Location: [facility] OR;  Service: Orthopedics;  Laterality: Left;   APPLICATION OF WOUND VAC Left 08/12/2020   Procedure: APPLICATION OF WOUND VAC;  Surgeon: Timothy Ford, MD;  Location: [facility] OR;  Service: Orthopedics;  Laterality: Left;   COLONOSCOPY     LAPAROSCOPIC APPENDECTOMY  01/05/2011   Procedure: APPENDECTOMY LAPAROSCOPIC;  Surgeon: Azucena Bollard, MD;  Location: [facility] ORS;  Service: General;  Laterality: N/A;   OOPHORECTOMY  2001   ROTATOR CUFF REPAIR Right    x 2   TUBAL LIGATION     VAGINAL HYSTERECTOMY  2001   Social History   Occupational History   Occupation: Retired   Tobacco Use   Smoking status: Never   Smokeless tobacco: Never  Vaping Use   Vaping status: Never Used  Substance and Sexual Activity    Alcohol use: Never   Drug use: Never   Sexual activity: Yes    Birth control/protection: Surgical

## 2023-05-22 ENCOUNTER — Ambulatory Visit (INDEPENDENT_AMBULATORY_CARE_PROVIDER_SITE_OTHER): Admitting: Orthopedic Surgery

## 2023-05-22 DIAGNOSIS — L97511 Non-pressure chronic ulcer of other part of right foot limited to breakdown of skin: Secondary | ICD-10-CM | POA: Diagnosis not present

## 2023-05-22 DIAGNOSIS — Z89432 Acquired absence of left foot: Secondary | ICD-10-CM

## 2023-05-23 ENCOUNTER — Encounter: Payer: Self-pay | Admitting: Orthopedic Surgery

## 2023-05-23 NOTE — Progress Notes (Signed)
 Office Visit Note   [name]           Date of Birth: 12-15-1945           MRN: [id number] Visit Date: 05/22/2023              Requested by: Rodney Clamp, MD [address] [place],  [place] [address] PCP: Rodney Clamp, MD  Chief Complaint  Patient presents with   Right Foot - Follow-up      HPI: Patient is a 78 year old woman who presents in follow-up for right foot plantar ulcer.  Patient is status post MRI scan.  Assessment & Plan: Visit Diagnoses:  1. Non-pressure chronic ulcer of other part of right foot limited to breakdown of skin (HCC)   2. 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HCC)     Plan: With the prominent third metatarsal head and chronic ulceration from this pressure patient wishes to proceed with excision of the third minute tarsal distally.  Risks and benefits were discussed including risk of the wound not healing need for further surgical intervention.  Patient states she understands wished to proceed at this time.  Follow-Up Instructions: Return in about 4 weeks (around 06/19/2023).   Ortho Exam  Patient is alert, oriented, no adenopathy, well-dressed, normal affect, normal respiratory effort. Examination patient has a chronic ulcer beneath the third metatarsal head of the right foot.  After informed consent a 10 blade knife was used to debride the skin and soft tissue back to bleeding viable granulation tissue.  Silver  nitrate was used hemostasis.  The ulcer does not probe to bone.  After debridement the ulcer is 1 cm in diameter.  Review of the MRI scan shows some mild edema in the third metatarsal head which seems more consistent with pressure effect than with osteomyelitis.  Imaging: No results found.   Labs: Lab Results  Component Value Date   HGBA1C 6.4 (A) 04/03/2023   HGBA1C 6.7 (H) 09/21/2022   HGBA1C 6.5 07/13/2022   ESRSEDRATE 51 (H) 12/24/2019   ESRSEDRATE 36 (H) 02/24/2017   CRP 13.2 (H) 12/24/2019   CRP  1.2 (H) 02/24/2017   LABURIC 5.5 09/01/2015   REPTSTATUS 06/03/2020 FINAL 05/29/2020   CULT  05/29/2020    NO GROWTH 5 DAYS Performed at [facility], [address] [place], [place] [address]    LABORGA GROUP B STREP (S.AGALACTIAE) ISOLATED 12/26/2014     Lab Results  Component Value Date   ALBUMIN 4.1 11/05/2021   ALBUMIN 4.1 11/05/2021   ALBUMIN 3.8 09/08/2020   PREALBUMIN 19.4 03/05/2012    No results found for: "MG" No results found for: "VD25OH"  Lab Results  Component Value Date   PREALBUMIN 19.4 03/05/2012      Latest Ref Rng & Units 12/16/2022    7:59 AM 09/16/2022    7:46 AM 06/16/2022    8:19 AM  CBC EXTENDED  WBC 4.0 - 10.5 K/uL 10.6  12.4  12.6   RBC 3.87 - 5.11 MIL/uL 4.18  4.11  4.07   Hemoglobin 12.0 - 15.0 g/dL 82.9  56.2  13.0   HCT 36.0 - 46.0 % 37.0  35.7  35.1   Platelets 150 - 400 K/uL 417  400  425   NEUT# 1.7 - 7.7 K/uL 6.8  8.3  8.5   Lymph# 0.7 - 4.0 K/uL 2.8  2.9  3.0      There is no height or weight on file  to calculate BMI.  Orders:  No orders of the defined types were placed in this encounter.  No orders of the defined types were placed in this encounter.    Procedures: No procedures performed  Clinical Data: No additional findings.  ROS:  All other systems negative, except as noted in the HPI. Review of Systems  Objective: Vital Signs: There were no vitals taken for this visit.  Specialty Comments:  No specialty comments available.  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) 10/23/2019   Nuclear sclerotic cataract of both eyes 10/23/2019   Posterior vitreous  detachment of right eye 10/23/2019   Retinal hemorrhage of left eye 10/23/2019   Impingement syndrome of right shoulder 07/06/2017   Iron deficiency anemia 05/11/2017   Morbid obesity (HCC) 04/24/2017   Anemia 02/24/2017   Baker's cyst 07/10/2016   Onychomycosis 12/07/2015   Upper airway cough syndrome 03/05/2014   Hypertension associated with diabetes (HCC) 12/05/2006   T2DM (type 2 diabetes mellitus) (HCC) 10/12/2006   Dyslipidemia associated with type 2 diabetes mellitus (HCC) 10/12/2006   Allergic rhinitis 10/12/2006   GERD - Followed by Dr. Sandrea Cruel 10/12/2006   IRRITABLE BOWEL SYNDROME - followed by Dr. Sandrea Cruel 10/12/2006   Peripheral neuropathy 10/11/2006   ALOPECIA NEC 10/11/2006   Past Medical History:  Diagnosis Date   Alopecia    Anemia, mild    Arthritis    Chronic cough    sees pulmonologist   Chronic pain    chest wall and abd - s/p extensive eval   Diabetes mellitus with neuropathy (HCC)    sees endocrine   Diverticulosis    Dyspnea    Fatty liver    GERD (gastroesophageal reflux disease)    takes Nexium  bid, hx erosive esophagitis   Headache(784.0)    occasionally;r/t sinus    History of colon polyps    HTN (hypertension)    Hx of amputation of lesser toe (HCC)    sees podiatrist   Hyperlipemia    IBS (irritable bowel syndrome)    Insomnia    takes Elavil  nightly   Joint pain    Neuropathy    Neuropathy    Osteomyelitis (HCC)    Pneumonia    89/2/22- patient denies   PONV (postoperative nausea and vomiting)    medication did not help with surgery on 03/28/20   Seasonal allergies    takes Zyrtec  daily   Sinus tachycardia     Family History  Problem Relation Age of Onset   Lung cancer Mother 43       smoked heavily   Emphysema Mother    Hypertension Father    Hyperlipidemia Father    Diabetes Father    Coronary artery disease Father    Dementia Father    COPD Father        smoked   Stomach cancer Paternal Aunt    Brain cancer Paternal Uncle     Irritable bowel syndrome Other        Several family members on fathers side    Diabetes Other    Stomach cancer Maternal Aunt    Lung cancer Paternal Uncle  Heart disease Paternal Uncle    Colon cancer Neg Hx     Past Surgical History:  Procedure Laterality Date   AMPUTATION  12/28/2011   Procedure: AMPUTATION DIGIT;  Surgeon: Timothy Ford, MD;  Location: [facility] OR;  Service: Orthopedics;  Laterality: Right;  Right Foot 2nd Toe Amputation at MTP (metatarsophalangeal joint)   AMPUTATION Right 04/25/2012   Procedure: Right Foot 3rd Toe Amputation;  Surgeon: Timothy Ford, MD;  Location: [facility] OR;  Service: Orthopedics;  Laterality: Right;  Right Foot Third Toe Amputation    AMPUTATION Right 07/27/2012   Procedure: Right 4th Toe Amputation at Metatarsophalangeal;  Surgeon: Timothy Ford, MD;  Location: [facility] OR;  Service: Orthopedics;  Laterality: Right;  Right 4th Toe Amputation at Metatarsophalangeal   AMPUTATION Left 07/15/2016   Procedure: Left 2nd Ray Amputation;  Surgeon: Timothy Ford, MD;  Location: [facility] OR;  Service: Orthopedics;  Laterality: Left;   AMPUTATION Left 11/18/2016   Procedure: Left 3rd and 4th Ray Amputation;  Surgeon: Timothy Ford, MD;  Location: [facility] OR;  Service: Orthopedics;  Laterality: Left;   AMPUTATION Right 03/29/2017   Procedure: RIGHT FOOT 3RD AND 4TH RAY AMPUTATION;  Surgeon: Timothy Ford, MD;  Location: [facility] OR;  Service: Orthopedics;  Laterality: Right;   AMPUTATION Left 08/12/2020   Procedure: LEFT TRANSMETATARSAL AMPUTATION;  Surgeon: Timothy Ford, MD;  Location: [facility] OR;  Service: Orthopedics;  Laterality: Left;   APPLICATION OF WOUND VAC Left 08/12/2020   Procedure: APPLICATION OF WOUND VAC;  Surgeon: Timothy Ford, MD;  Location: [facility] OR;  Service: Orthopedics;  Laterality: Left;   COLONOSCOPY     LAPAROSCOPIC APPENDECTOMY  01/05/2011   Procedure: APPENDECTOMY LAPAROSCOPIC;  Surgeon: Azucena Bollard, MD;  Location: [facility] ORS;  Service: General;  Laterality: N/A;    OOPHORECTOMY  2001   ROTATOR CUFF REPAIR Right    x 2   TUBAL LIGATION     VAGINAL HYSTERECTOMY  2001   Social History   Occupational History   Occupation: Retired   Tobacco Use   Smoking status: Never   Smokeless tobacco: Never  Vaping Use   Vaping status: Never Used  Substance and Sexual Activity   Alcohol use: Never   Drug use: Never   Sexual activity: Yes    Birth control/protection: Surgical

## 2023-05-30 ENCOUNTER — Encounter (HOSPITAL_COMMUNITY): Payer: Self-pay | Admitting: Orthopedic Surgery

## 2023-05-30 ENCOUNTER — Other Ambulatory Visit: Payer: Self-pay

## 2023-05-30 NOTE — Anesthesia Preprocedure Evaluation (Addendum)
 Anesthesia Evaluation  Patient identified by MRN, date of birth, ID band Patient awake    Reviewed: Allergy & Precautions, NPO status , Patient's Chart, lab work & pertinent test results, reviewed documented beta blocker date and time   History of Anesthesia Complications (+) PONV and history of anesthetic complications  Airway Mallampati: II  TM Distance: >3 FB Neck ROM: Full    Dental  (+) Teeth Intact, Dental Advisory Given   Pulmonary neg pulmonary ROS   Pulmonary exam normal breath sounds clear to auscultation       Cardiovascular hypertension, Pt. on medications and Pt. on home beta blockers Normal cardiovascular exam Rhythm:Regular Rate:Normal     Neuro/Psych  Headaches  negative psych ROS   GI/Hepatic Neg liver ROS,GERD  ,,  Endo/Other  diabetes, Type 2, Insulin  Dependent    Renal/GU negative Renal ROS  negative genitourinary   Musculoskeletal  (+) Arthritis ,    Abdominal   Peds  Hematology negative hematology ROS (+)   Anesthesia Other Findings Labs?  Reproductive/Obstetrics                             Anesthesia Physical Anesthesia Plan  ASA: 3  Anesthesia Plan: MAC   Post-op Pain Management: Tylenol  PO (pre-op)*   Induction: Intravenous  PONV Risk Score and Plan: 3 and Propofol  infusion, Treatment may vary due to age or medical condition and Ondansetron   Airway Management Planned: Natural Airway  Additional Equipment:   Intra-op Plan:   Post-operative Plan:   Informed Consent: I have reviewed the patients History and Physical, chart, labs and discussed the procedure including the risks, benefits and alternatives for the proposed anesthesia with the patient or authorized representative who has indicated his/her understanding and acceptance.     Dental advisory given  Plan Discussed with: CRNA  Anesthesia Plan Comments: (PAT note by Rudy Costain,  PA-C: 78 year old female follows with cardiology for history of HTN, HLD, palpitations.  Myoview  2018 was low risk.  Echo 2016 showed normal biventricular function, no significant valvular abnormalities.  Last seen by Dr. Avanell Bob 09/21/2022, stable at that time, no changes to management, 1 year follow-up recommended.  Other pertinent history includes GERD on PPI, PONV, IDDM2 - A1c 6.4 on 04/03/2023.  She reports last dose Mounjaro  05/22/2023.  She will need day of surgery labs and evaluation.  EKG 09/21/2022: NSR.  Rate 71.  Nuclear stress 01/15/2016:  Nuclear stress EF: 57%.  The study is normal.  This is a low risk study.  The left ventricular ejection fraction is normal (55-65%).  There was no ST segment deviation noted during stress.   Normal resting and stress perfusion. No ischemia or infarction EF 57%   TTE 01/17/2014: - Left ventricle: The cavity size was normal. Wall thickness was  normal. Systolic function was normal. The estimated ejection  fraction was in the range of 60% to 65%. Left ventricular  diastolic function parameters were normal.  - Atrial septum: No defect or patent foramen ovale was identified.  - Pericardium, extracardiac: A trivial pericardial effusion was  identified posterior to the heart.   )        Anesthesia Quick Evaluation

## 2023-05-30 NOTE — Progress Notes (Signed)
 SDW CALL  Patient was given pre-op instructions over the phone. The opportunity was given for the patient to ask questions. No further questions asked. Patient verbalized understanding of instructions given.   PCP - Valdene Garret Cardiologist - Dr. Ola Berger  -LOV was 09/21/22 and instructed to follow up in 1 year  PPM/ICD - denies   Chest x-ray - denies EKG - 09/21/22 Stress Test - 01/01/16 ECHO - 01/17/14 Cardiac Cath -   Sleep Study - denies  Fasting Blood Sugar - 147 this AM but can wake up below 70 Checks Blood Sugar _3-4____ times a day  Last dose of GLP1 agonist-  last dose of Mounjaro  was 5/12 - patient aware not to take a dose prior to surgery tomorrow.    Blood Thinner Instructions: n/a Aspirin Instructions: n/a  ERAS Protcol - clears until 0730 PRE-SURGERY Ensure or G2- n/a  COVID TEST- n/a   Anesthesia review: yes - GLP1, HTN, DM, sinus tach  Patient denies shortness of breath, fever, cough and chest pain over the phone call   All instructions explained to the patient, with a verbal understanding of the material. Patient agrees to go over the instructions while at home for a better understanding.

## 2023-05-30 NOTE — Progress Notes (Signed)
 Anesthesia Chart Review: Same-day workup  78 year old female follows with cardiology for history of HTN, HLD, palpitations.  Myoview  2018 was low risk.  Echo 2016 showed normal biventricular function, no significant valvular abnormalities.  Last seen by Dr. Avanell Bob 09/21/2022, stable at that time, no changes to management, 1 year follow-up recommended.  Other pertinent history includes GERD on PPI, PONV, IDDM2 - A1c 6.4 on 04/03/2023.  She reports last dose Mounjaro  05/22/2023.  She will need day of surgery labs and evaluation.  EKG 09/21/2022: NSR.  Rate 71.  Nuclear stress 01/15/2016: Nuclear stress EF: 57%. The study is normal. This is a low risk study. The left ventricular ejection fraction is normal (55-65%). There was no ST segment deviation noted during stress.   Normal resting and stress perfusion. No ischemia or infarction EF 57%   TTE 01/17/2014: - Left ventricle: The cavity size was normal. Wall thickness was    normal. Systolic function was normal. The estimated ejection    fraction was in the range of 60% to 65%. Left ventricular    diastolic function parameters were normal.  - Atrial septum: No defect or patent foramen ovale was identified.  - Pericardium, extracardiac: A trivial pericardial effusion was    identified posterior to the heart.     Edilia Gordon [facility]/Anesthesiology Phone [phone] 05/30/2023 12:29 PM

## 2023-05-31 ENCOUNTER — Other Ambulatory Visit: Payer: Self-pay

## 2023-05-31 ENCOUNTER — Ambulatory Visit (HOSPITAL_BASED_OUTPATIENT_CLINIC_OR_DEPARTMENT_OTHER): Admitting: Physician Assistant

## 2023-05-31 ENCOUNTER — Ambulatory Visit (HOSPITAL_COMMUNITY)
Admission: RE | Admit: 2023-05-31 | Discharge: 2023-05-31 | Disposition: A | Discharge status: Home / Self Care | Attending: Orthopedic Surgery | Admitting: Orthopedic Surgery

## 2023-05-31 ENCOUNTER — Ambulatory Visit (HOSPITAL_COMMUNITY): Admitting: Physician Assistant

## 2023-05-31 ENCOUNTER — Encounter (HOSPITAL_COMMUNITY)
Admission: RE | Disposition: A | Discharge status: Home / Self Care | Payer: Self-pay | Source: Home / Self Care | Attending: Orthopedic Surgery

## 2023-05-31 ENCOUNTER — Encounter (HOSPITAL_COMMUNITY): Payer: Self-pay | Admitting: Orthopedic Surgery

## 2023-05-31 DIAGNOSIS — E11621 Type 2 diabetes mellitus with foot ulcer: Secondary | ICD-10-CM | POA: Diagnosis not present

## 2023-05-31 DIAGNOSIS — M86171 Other acute osteomyelitis, right ankle and foot: Secondary | ICD-10-CM | POA: Diagnosis not present

## 2023-05-31 DIAGNOSIS — E1169 Type 2 diabetes mellitus with other specified complication: Secondary | ICD-10-CM | POA: Diagnosis not present

## 2023-05-31 DIAGNOSIS — M869 Osteomyelitis, unspecified: Secondary | ICD-10-CM | POA: Insufficient documentation

## 2023-05-31 DIAGNOSIS — K219 Gastro-esophageal reflux disease without esophagitis: Secondary | ICD-10-CM | POA: Diagnosis not present

## 2023-05-31 DIAGNOSIS — L97511 Non-pressure chronic ulcer of other part of right foot limited to breakdown of skin: Secondary | ICD-10-CM | POA: Insufficient documentation

## 2023-05-31 DIAGNOSIS — I10 Essential (primary) hypertension: Secondary | ICD-10-CM | POA: Insufficient documentation

## 2023-05-31 DIAGNOSIS — Z794 Long term (current) use of insulin: Secondary | ICD-10-CM | POA: Diagnosis not present

## 2023-05-31 DIAGNOSIS — M1990 Unspecified osteoarthritis, unspecified site: Secondary | ICD-10-CM | POA: Diagnosis not present

## 2023-05-31 DIAGNOSIS — E119 Type 2 diabetes mellitus without complications: Secondary | ICD-10-CM | POA: Diagnosis not present

## 2023-05-31 DIAGNOSIS — L97519 Non-pressure chronic ulcer of other part of right foot with unspecified severity: Secondary | ICD-10-CM | POA: Diagnosis not present

## 2023-05-31 HISTORY — PX: AMPUTATION: SHX166

## 2023-05-31 LAB — BASIC METABOLIC PANEL WITH GFR
Anion gap: 12 (ref 5–15)
BUN: 18 mg/dL (ref 8–23)
CO2: 23 mmol/L (ref 22–32)
Calcium: 9.4 mg/dL (ref 8.9–10.3)
Chloride: 104 mmol/L (ref 98–111)
Creatinine, Ser: 1.02 mg/dL — ABNORMAL HIGH (ref 0.44–1.00)
GFR, Estimated: 57 mL/min — ABNORMAL LOW (ref >60)
Glucose, Bld: 127 mg/dL — ABNORMAL HIGH (ref 70–99)
Potassium: 3.9 mmol/L (ref 3.5–5.1)
Sodium: 139 mmol/L (ref 135–145)

## 2023-05-31 LAB — CBC
HCT: 36 % (ref 36.0–46.0)
Hemoglobin: 12.1 g/dL (ref 12.0–15.0)
MCH: 29.6 pg (ref 26.0–34.0)
MCHC: 33.6 g/dL (ref 30.0–36.0)
MCV: 88 fL (ref 80.0–100.0)
Platelets: 225 10*3/uL (ref 150–400)
RBC: 4.09 MIL/uL (ref 3.87–5.11)
RDW: 12.8 % (ref 11.5–15.5)
WBC: 10.3 10*3/uL (ref 4.0–10.5)
nRBC: 0 % (ref 0.0–0.2)

## 2023-05-31 LAB — GLUCOSE, CAPILLARY
Glucose-Capillary: 155 mg/dL — ABNORMAL HIGH (ref 70–99)
Glucose-Capillary: 93 mg/dL (ref 70–99)
Glucose-Capillary: 118 mg/dL — ABNORMAL HIGH (ref 70–99)

## 2023-05-31 SURGERY — AMPUTATION, FOOT, RAY
Anesthesia: Monitor Anesthesia Care | Site: Foot | Laterality: Right

## 2023-05-31 MED ORDER — LIDOCAINE HCL (PF) 1 % IJ SOLN
INTRAMUSCULAR | Status: DC | PRN
Start: 2023-05-31 — End: 2023-05-31
  Administered 2023-05-31: 20 mL

## 2023-05-31 MED ORDER — PROPOFOL 500 MG/50ML IV EMUL
INTRAVENOUS | Status: DC | PRN
  Administered 2023-05-31: 100 ug/kg/min via INTRAVENOUS

## 2023-05-31 MED ORDER — CEFAZOLIN SODIUM-DEXTROSE 2-4 GM/100ML-% IV SOLN
2.0000 g | INTRAVENOUS | Status: AC
Start: 2023-05-31 — End: 2023-05-31
  Administered 2023-05-31: 2 g via INTRAVENOUS
  Filled 2023-05-31: qty 100

## 2023-05-31 MED ORDER — CHLORHEXIDINE GLUCONATE 0.12 % MT SOLN: 15.0000 mL | Freq: Once | OROMUCOSAL | Status: AC

## 2023-05-31 MED ORDER — VASHE WOUND IRRIGATION OPTIME
TOPICAL | Status: DC | PRN
  Administered 2023-05-31: 34 [oz_av]

## 2023-05-31 MED ORDER — ONDANSETRON HCL 4 MG/2ML IJ SOLN
INTRAMUSCULAR | Status: DC | PRN
Start: 2023-05-31 — End: 2023-05-31
  Administered 2023-05-31: 4 mg via INTRAVENOUS

## 2023-05-31 MED ORDER — ORAL CARE MOUTH RINSE: 15.0000 mL | Freq: Once | OROMUCOSAL | Status: AC

## 2023-05-31 MED ORDER — HYDROCODONE-ACETAMINOPHEN 5-325 MG PO TABS: 1.0000 | ORAL_TABLET | ORAL | 0 refills | Status: DC | PRN

## 2023-05-31 MED ORDER — LACTATED RINGERS IV SOLN: INTRAVENOUS | Status: DC

## 2023-05-31 MED ORDER — CHLORHEXIDINE GLUCONATE 0.12 % MT SOLN
OROMUCOSAL | Status: AC
  Administered 2023-05-31: 15 mL via OROMUCOSAL
  Filled 2023-05-31: qty 15

## 2023-05-31 MED ORDER — LIDOCAINE HCL (PF) 1 % IJ SOLN
INTRAMUSCULAR | Status: AC
  Filled 2023-05-31: qty 30

## 2023-05-31 MED ORDER — PROPOFOL 10 MG/ML IV BOLUS
INTRAVENOUS | Status: DC | PRN
  Administered 2023-05-31: 40 mg via INTRAVENOUS

## 2023-05-31 SURGICAL SUPPLY — 26 items
BAG COUNTER SPONGE SURGICOUNT (BAG) ×1 IMPLANT
BLADE SAW SGTL MED 73X18.5 STR (BLADE) IMPLANT
BLADE SURG 21 STRL SS (BLADE) ×1 IMPLANT
BNDG COHESIVE 4X5 TAN STRL LF (GAUZE/BANDAGES/DRESSINGS) ×1 IMPLANT
BNDG COHESIVE 6X5 TAN NS LF (GAUZE/BANDAGES/DRESSINGS) IMPLANT
BNDG GAUZE DERMACEA FLUFF 4 (GAUZE/BANDAGES/DRESSINGS) ×1 IMPLANT
COVER SURGICAL LIGHT HANDLE (MISCELLANEOUS) ×2 IMPLANT
DRAPE U-SHAPE 47X51 STRL (DRAPES) ×2 IMPLANT
DRSG ADAPTIC 3X8 NADH LF (GAUZE/BANDAGES/DRESSINGS) ×1 IMPLANT
DURAPREP 26ML APPLICATOR (WOUND CARE) ×1 IMPLANT
ELECTRODE REM PT RTRN 9FT ADLT (ELECTROSURGICAL) ×1 IMPLANT
GAUZE PAD ABD 8X10 STRL (GAUZE/BANDAGES/DRESSINGS) ×2 IMPLANT
GAUZE SPONGE 4X4 12PLY STRL (GAUZE/BANDAGES/DRESSINGS) ×1 IMPLANT
GLOVE BIOGEL PI IND STRL 9 (GLOVE) ×1 IMPLANT
GLOVE SURG ORTHO 9.0 STRL STRW (GLOVE) ×1 IMPLANT
GOWN STRL REUS W/ TWL XL LVL3 (GOWN DISPOSABLE) ×2 IMPLANT
KIT BASIN OR (CUSTOM PROCEDURE TRAY) ×1 IMPLANT
KIT TURNOVER KIT B (KITS) ×1 IMPLANT
NS IRRIG 1000ML POUR BTL (IV SOLUTION) ×1 IMPLANT
PACK ORTHO EXTREMITY (CUSTOM PROCEDURE TRAY) ×1 IMPLANT
PAD ARMBOARD POSITIONER FOAM (MISCELLANEOUS) ×2 IMPLANT
STOCKINETTE IMPERVIOUS LG (DRAPES) IMPLANT
SUT ETHILON 2 0 PSLX (SUTURE) ×1 IMPLANT
TOWEL GREEN STERILE (TOWEL DISPOSABLE) ×1 IMPLANT
TUBE CONNECTING 12X1/4 (SUCTIONS) ×1 IMPLANT
YANKAUER SUCT BULB TIP NO VENT (SUCTIONS) ×1 IMPLANT

## 2023-05-31 NOTE — H&P (Signed)
 Beth Holden is an 78 y.o. female.   Chief Complaint: Chronic ulceration right foot. HPI: Patient presents with insensate neuropathy status post foot salvage intervention in the past with recurrent ulceration.  MRI scan shows early edema in the bone with the chronic ulcer.  Past Medical History:  Diagnosis Date   Alopecia    Anemia, mild    Arthritis    Chronic cough    sees pulmonologist   Chronic pain    chest wall and abd - s/p extensive eval   Diabetes mellitus with neuropathy (HCC)    sees endocrine   Diverticulosis    Dyspnea    Fatty liver    GERD (gastroesophageal reflux disease)    takes Nexium  bid, hx erosive esophagitis   Headache(784.0)    occasionally;r/t sinus    History of colon polyps    HTN (hypertension)    Hx of amputation of lesser toe (HCC)    sees podiatrist   Hyperlipemia    IBS (irritable bowel syndrome)    Insomnia    takes Elavil  nightly   Joint pain    Neuropathy    Neuropathy    Osteomyelitis (HCC)    Pneumonia    89/2/22- patient denies   PONV (postoperative nausea and vomiting)    medication did not help with surgery on 03/28/20   Seasonal allergies    takes Zyrtec  daily   Sinus tachycardia     Past Surgical History:  Procedure Laterality Date   AMPUTATION  12/28/2011   Procedure: AMPUTATION DIGIT;  Surgeon: Timothy Ford, MD;  Location: [facility] OR;  Service: Orthopedics;  Laterality: Right;  Right Foot 2nd Toe Amputation at MTP (metatarsophalangeal joint)   AMPUTATION Right 04/25/2012   Procedure: Right Foot 3rd Toe Amputation;  Surgeon: Timothy Ford, MD;  Location: [facility] OR;  Service: Orthopedics;  Laterality: Right;  Right Foot Third Toe Amputation    AMPUTATION Right 07/27/2012   Procedure: Right 4th Toe Amputation at Metatarsophalangeal;  Surgeon: Timothy Ford, MD;  Location: [facility] OR;  Service: Orthopedics;  Laterality: Right;  Right 4th Toe Amputation at Metatarsophalangeal   AMPUTATION Left 07/15/2016   Procedure: Left 2nd Ray  Amputation;  Surgeon: Timothy Ford, MD;  Location: [facility] OR;  Service: Orthopedics;  Laterality: Left;   AMPUTATION Left 11/18/2016   Procedure: Left 3rd and 4th Ray Amputation;  Surgeon: Timothy Ford, MD;  Location: [facility] OR;  Service: Orthopedics;  Laterality: Left;   AMPUTATION Right 03/29/2017   Procedure: RIGHT FOOT 3RD AND 4TH RAY AMPUTATION;  Surgeon: Timothy Ford, MD;  Location: [facility] OR;  Service: Orthopedics;  Laterality: Right;   AMPUTATION Left 08/12/2020   Procedure: LEFT TRANSMETATARSAL AMPUTATION;  Surgeon: Timothy Ford, MD;  Location: [facility] OR;  Service: Orthopedics;  Laterality: Left;   APPLICATION OF WOUND VAC Left 08/12/2020   Procedure: APPLICATION OF WOUND VAC;  Surgeon: Timothy Ford, MD;  Location: [facility] OR;  Service: Orthopedics;  Laterality: Left;   COLONOSCOPY     LAPAROSCOPIC APPENDECTOMY  01/05/2011   Procedure: APPENDECTOMY LAPAROSCOPIC;  Surgeon: Azucena Bollard, MD;  Location: [facility] ORS;  Service: General;  Laterality: N/A;   OOPHORECTOMY  2001   ROTATOR CUFF REPAIR Right    x 2   TUBAL LIGATION     VAGINAL HYSTERECTOMY  2001    Family History  Problem Relation Age of Onset   Lung cancer Mother 63       smoked heavily   Emphysema  Mother    Hypertension Father    Hyperlipidemia Father    Diabetes Father    Coronary artery disease Father    Dementia Father    COPD Father        smoked   Stomach cancer Paternal Aunt    Brain cancer Paternal Uncle    Irritable bowel syndrome Other        Several family members on fathers side    Diabetes Other    Stomach cancer Maternal Aunt    Lung cancer Paternal Uncle    Heart disease Paternal Uncle    Colon cancer Neg Hx    Social History:  reports that she has never smoked. She has never used smokeless tobacco. She reports that she does not drink alcohol and does not use drugs.  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    No results found. However, due to the size of the patient record, not all encounters were searched. Please check Results Review for a complete set of results. No results found.  Review of Systems  All other systems reviewed and are negative.   There were no vitals taken for this visit. Physical Exam  Patient is alert, oriented, no adenopathy, well-dressed, normal affect, normal respiratory effort. Examination patient has a chronic ulcer beneath the third metatarsal head of the right foot.  After informed consent a 10 blade knife was used to debride the skin and soft tissue back to bleeding viable granulation tissue.  Silver  nitrate was used hemostasis.  The ulcer does not probe to bone.  After debridement the ulcer is 1 cm in diameter.  Review of the MRI scan shows some mild edema in the third metatarsal head which seems more consistent with pressure effect than with osteomyelitis. Assessment/Plan 1. Non-pressure chronic ulcer of other part of right foot limited to breakdown of skin (HCC)   2. 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HCC)       Plan: With the prominent third metatarsal head and chronic ulceration from this pressure patient wishes to proceed with excision of the third minute tarsal distally.  Risks and benefits were discussed including risk of the wound not healing need for further surgical intervention.  Patient states she understands wished to proceed at this time.  Timothy Ford, MD 05/31/2023, 7:01 AM

## 2023-05-31 NOTE — Anesthesia Postprocedure Evaluation (Signed)
 Anesthesia Post Note  [name]  Procedure(s) Performed: AMPUTATION, FOOT, RAY (Right: Foot)     Patient location during evaluation: PACU Anesthesia Type: MAC Level of consciousness: awake and alert Pain management: pain level controlled Vital Signs Assessment: post-procedure vital signs reviewed and stable Respiratory status: spontaneous breathing, nonlabored ventilation, respiratory function stable and patient connected to nasal cannula oxygen Cardiovascular status: stable and blood pressure returned to baseline Postop Assessment: no apparent nausea or vomiting Anesthetic complications: no  No notable events documented.  Last Vitals:  Vitals:   05/31/23 1102 05/31/23 1117  BP: (!) 110/59 119/61  Pulse: 74 76  Resp: 17 17  Temp: 36.5 C 36.5 C  SpO2: 99% 99%    Last Pain:  Vitals:   05/31/23 1117  PainSc: 0-No pain                 [name] [name]

## 2023-05-31 NOTE — Op Note (Signed)
 05/31/2023  11:01 AM  PATIENT:  Beth Holden    PRE-OPERATIVE DIAGNOSIS:  Ulceration Right Foot with osteomyelitis third metatarsal  POST-OPERATIVE DIAGNOSIS:  Same  PROCEDURE:  AMPUTATION, FOOT, RAY, third metatarsal.  SURGEON:  Timothy Ford, MD  PHYSICIAN ASSISTANT:None ANESTHESIA:   General  PREOPERATIVE INDICATIONS:  Beth Holden is a  78 y.o. female with a diagnosis of Ulceration Right Foot who failed conservative measures and elected for surgical management.    The risks benefits and alternatives were discussed with the patient preoperatively including but not limited to the risks of infection, bleeding, nerve injury, cardiopulmonary complications, the need for revision surgery, among others, and the patient was willing to proceed.  OPERATIVE IMPLANTS:   * No implants in log *  @ENCIMAGES @  OPERATIVE FINDINGS: No deep abscess.  OPERATIVE PROCEDURE: Patient was brought the operating room and underwent a MAC anesthetic.  After adequate levels anesthesia obtained patient's right lower extremity was prepped using DuraPrep draped into a sterile field a timeout was called.  A dorsal incision was made through a previous dorsal incision.  This was carried down to the third metatarsal.  The third metatarsal was resected there was no further pressure on the area of the plantar ulcer.  Electrocautery was used hemostasis.  The wound was irrigated with Vashe.  Incision closed using 2-0 nylon and a sterile dressing was applied patient was taken the PACU in stable condition.   DISCHARGE PLANNING:  Antibiotic duration: Preoperative antibiotics  Weightbearing: TDWB  Pain medication: vicodin  Dressing care/ Wound VAC:dry dressing  Ambulatory devices:walker  Discharge to: home  Follow-up: In the office 1 week post operative.

## 2023-05-31 NOTE — Interval H&P Note (Signed)
 History and Physical Interval Note:  05/31/2023 10:29 AM  Beth Holden  has presented today for surgery, with the diagnosis of Ulceration Right Foot.  The various methods of treatment have been discussed with the patient and family. After consideration of risks, benefits and other options for treatment, the patient has consented to  Procedure(s) with comments: AMPUTATION, FOOT, RAY (Right) - RIGHT 3RD METATARSAL RESECTION as a surgical intervention.  The patient's history has been reviewed, patient examined, no change in status, stable for surgery.  I have reviewed the patient's chart and labs.  Questions were answered to the patient's satisfaction.     Notnamed Scholz V Eulis [name]

## 2023-05-31 NOTE — Transfer of Care (Signed)
 Immediate Anesthesia Transfer of Care Note  Patient: [name]  Procedure(s) Performed: AMPUTATION, FOOT, RAY (Right: Foot)  Patient Location: PACU  Anesthesia Type:MAC  Level of Consciousness: drowsy and patient cooperative  Airway & Oxygen Therapy: Patient Spontanous Breathing  Post-op Assessment: Report given to RN, Post -op Vital signs reviewed and stable, and Patient moving all extremities X 4  Post vital signs: Reviewed and stable  Last Vitals:  Vitals Value Taken Time  BP 110/59 05/31/23 1102  Temp    Pulse 74 05/31/23 1104  Resp 20 05/31/23 1104  SpO2 96 % 05/31/23 1104  Vitals shown include unfiled device data.  Last Pain:  Vitals:   05/31/23 0842  PainSc: 0-No pain         Complications: No notable events documented.

## 2023-06-01 ENCOUNTER — Encounter (HOSPITAL_COMMUNITY): Payer: Self-pay | Admitting: Orthopedic Surgery

## 2023-06-07 ENCOUNTER — Encounter: Admitting: Family

## 2023-06-09 ENCOUNTER — Encounter: Payer: Self-pay | Admitting: Family

## 2023-06-09 ENCOUNTER — Ambulatory Visit (INDEPENDENT_AMBULATORY_CARE_PROVIDER_SITE_OTHER): Admitting: Family

## 2023-06-09 DIAGNOSIS — Z89431 Acquired absence of right foot: Secondary | ICD-10-CM

## 2023-06-09 NOTE — Progress Notes (Signed)
 Post-Op Visit Note   [name]           Date of Birth: 1945-08-30           MRN: [id number] Visit Date: 06/09/2023 PCP: Rodney Clamp, MD  Chief Complaint:  Chief Complaint  Patient presents with   Right Foot - Routine Post Op    05/31/2023 right 3rd ray amp    HPI:  HPI The patient is a 78 year old woman seen status post right foot third ray amputation May 21  Ortho Exam On examination right foot the dorsal incision is well-approximated sutures this is clean dry and intact there is no edema  Visit Diagnoses: No diagnosis found.  Plan: Begin daily Dial soap cleansing.  Dry dressings.  Follow-up in the office in 2 weeks for suture removal.  Follow-Up Instructions: No follow-ups on file.   Imaging: No results found.  Orders:  No orders of the defined types were placed in this encounter.  No orders of the defined types were placed in this encounter.    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) 10/23/2019   Nuclear sclerotic cataract of both eyes 10/23/2019   Posterior vitreous detachment of right eye 10/23/2019   Retinal hemorrhage of left eye 10/23/2019   Impingement syndrome of right shoulder 07/06/2017   Iron deficiency anemia 05/11/2017   Morbid obesity (HCC) 04/24/2017   Anemia 02/24/2017   Baker's cyst 07/10/2016   Onychomycosis 12/07/2015   Upper airway cough syndrome 03/05/2014   Hypertension associated with diabetes (HCC) 12/05/2006   T2DM (type 2 diabetes mellitus) (HCC) 10/12/2006   Dyslipidemia associated with type 2 diabetes  mellitus (HCC) 10/12/2006   Allergic rhinitis 10/12/2006   GERD - Followed by Dr. Sandrea Cruel 10/12/2006   IRRITABLE BOWEL SYNDROME - followed by Dr. Sandrea Cruel 10/12/2006   Peripheral neuropathy 10/11/2006   ALOPECIA NEC 10/11/2006   Past Medical History:  Diagnosis Date   Alopecia    Anemia, mild    Arthritis    Chronic cough    sees pulmonologist   Chronic pain    chest wall and abd - s/p extensive eval   Diabetes mellitus with neuropathy (HCC)    sees endocrine   Diverticulosis    Dyspnea    Fatty liver    GERD (gastroesophageal reflux disease)    takes Nexium  bid, hx erosive esophagitis   Headache(784.0)    occasionally;r/t sinus    History of colon polyps    HTN (hypertension)    Hx of amputation of lesser toe (HCC)    sees podiatrist   Hyperlipemia    IBS (irritable bowel syndrome)    Insomnia    takes Elavil  nightly   Joint pain    Neuropathy    Neuropathy    Osteomyelitis (HCC)    Pneumonia    89/2/22- patient denies   PONV (postoperative nausea and vomiting)    medication did not help with surgery on 03/28/20   Seasonal allergies    takes Zyrtec  daily   Sinus tachycardia     Family History  Problem Relation  Age of Onset   Lung cancer Mother 75       smoked heavily   Emphysema Mother    Hypertension Father    Hyperlipidemia Father    Diabetes Father    Coronary artery disease Father    Dementia Father    COPD Father        smoked   Stomach cancer Paternal Aunt    Brain cancer Paternal Uncle    Irritable bowel syndrome Other        Several family members on fathers side    Diabetes Other    Stomach cancer Maternal Aunt    Lung cancer Paternal Uncle    Heart disease Paternal Uncle    Colon cancer Neg Hx     Past Surgical History:  Procedure Laterality Date   AMPUTATION  12/28/2011   Procedure: AMPUTATION DIGIT;  Surgeon: Timothy Ford, MD;  Location: [facility] OR;  Service: Orthopedics;  Laterality: Right;  Right Foot 2nd Toe Amputation at MTP  (metatarsophalangeal joint)   AMPUTATION Right 04/25/2012   Procedure: Right Foot 3rd Toe Amputation;  Surgeon: Timothy Ford, MD;  Location: [facility] OR;  Service: Orthopedics;  Laterality: Right;  Right Foot Third Toe Amputation    AMPUTATION Right 07/27/2012   Procedure: Right 4th Toe Amputation at Metatarsophalangeal;  Surgeon: Timothy Ford, MD;  Location: [facility] OR;  Service: Orthopedics;  Laterality: Right;  Right 4th Toe Amputation at Metatarsophalangeal   AMPUTATION Left 07/15/2016   Procedure: Left 2nd Ray Amputation;  Surgeon: Timothy Ford, MD;  Location: [facility] OR;  Service: Orthopedics;  Laterality: Left;   AMPUTATION Left 11/18/2016   Procedure: Left 3rd and 4th Ray Amputation;  Surgeon: Timothy Ford, MD;  Location: [facility] OR;  Service: Orthopedics;  Laterality: Left;   AMPUTATION Right 03/29/2017   Procedure: RIGHT FOOT 3RD AND 4TH RAY AMPUTATION;  Surgeon: Timothy Ford, MD;  Location: [facility] OR;  Service: Orthopedics;  Laterality: Right;   AMPUTATION Left 08/12/2020   Procedure: LEFT TRANSMETATARSAL AMPUTATION;  Surgeon: Timothy Ford, MD;  Location: [facility] OR;  Service: Orthopedics;  Laterality: Left;   AMPUTATION Right 05/31/2023   Procedure: AMPUTATION, FOOT, RAY;  Surgeon: Timothy Ford, MD;  Location: [facility] OR;  Service: Orthopedics;  Laterality: Right;  RIGHT 3RD METATARSAL RESECTION   APPLICATION OF WOUND VAC Left 08/12/2020   Procedure: APPLICATION OF WOUND VAC;  Surgeon: Timothy Ford, MD;  Location: [facility] OR;  Service: Orthopedics;  Laterality: Left;   COLONOSCOPY     LAPAROSCOPIC APPENDECTOMY  01/05/2011   Procedure: APPENDECTOMY LAPAROSCOPIC;  Surgeon: Azucena Bollard, MD;  Location: [facility] ORS;  Service: General;  Laterality: N/A;   OOPHORECTOMY  2001   ROTATOR CUFF REPAIR Right    x 2   TUBAL LIGATION     VAGINAL HYSTERECTOMY  2001   Social History   Occupational History   Occupation: Retired   Tobacco Use   Smoking status: Never   Smokeless tobacco: Never  Vaping Use   Vaping status:  Never Used  Substance and Sexual Activity   Alcohol use: Never   Drug use: Never   Sexual activity: Yes    Birth control/protection: Surgical

## 2023-06-13 ENCOUNTER — Ambulatory Visit: Payer: Self-pay

## 2023-06-13 NOTE — Telephone Encounter (Signed)
 Patient returned call after being disconnected. Triage completed for mouth lesions. Patient able to swallow without difficulty no fever. Scheduled appt with PCP tomorrow. Recommended if sx worsen go to UC/ED.

## 2023-06-13 NOTE — Telephone Encounter (Signed)
 Towards the end of the triage process, the call dropped. [place] unable to complete the scheduling process. RN called the pt back and LVM with a CB number asking her to give us  a call so we can schedule an appt.   Copied from CRM [id number]. Topic: Clinical - Red Word Triage >> Jun 13, 2023  2:14 PM Baldo Levan wrote: Red Word that prompted transfer to Nurse Triage: Patient has a soreness and rash in her mouth she believes is spreading through her mouth. Reason for Disposition  4 or more ulcers  Answer Assessment - Initial Assessment Questions 1. LOCATION: "Where is the ulcer located?"      Pt states she warmed up dressing, took a bite, some of it was hot, some was cold, she spit it out; thinks she burned her mouth, but it looks like it's spreading across the roof of her mouth; even feels like it's going to the other side of her mouth where her teeth are, going under the tongue  2. NUMBER: "How many ulcers are there?"      Rash, spreading  3. SIZE: "How large is the ulcer?"      Spreading  4. SEVERITY: "Are they painful?" If Yes, ask: "How bad is it?"  (Scale 1-10; or mild, moderate, severe)  - MILD - eating  and drinking normally   - MODERATE - decreased liquid intake   - SEVERE - drinking very little      One spot is more sore than the rest, ridge of her mouth, "feels like a rash"; "it just bothers me, it burns", burning w/ eating; able to drink normally  5. ONSET: "When did you first notice the ulcer?"      Last week sometime, maybe Wednesday or Thursday  6. RECURRENT SYMPTOM: "Have you had a mouth ulcer before?" If Yes, ask: "When was the last time?" and "What happened that time?"      No  7. CAUSE: "What do you think is causing the mouth ulcer?"     Not sure  8. OTHER SYMPTOMS: "Do you have any other symptoms?" (e.g., fever)     No fever, no bleeding  Protocols used: Mouth Ulcers-A-AH

## 2023-06-14 ENCOUNTER — Encounter: Payer: Self-pay | Admitting: Family Medicine

## 2023-06-14 ENCOUNTER — Ambulatory Visit (INDEPENDENT_AMBULATORY_CARE_PROVIDER_SITE_OTHER): Admitting: Family Medicine

## 2023-06-14 VITALS — BP 107/65 | HR 65 | Temp 97.2°F | Ht 66.0 in | Wt 184.8 lb

## 2023-06-14 DIAGNOSIS — Z794 Long term (current) use of insulin: Secondary | ICD-10-CM

## 2023-06-14 DIAGNOSIS — R109 Unspecified abdominal pain: Secondary | ICD-10-CM | POA: Diagnosis not present

## 2023-06-14 DIAGNOSIS — I152 Hypertension secondary to endocrine disorders: Secondary | ICD-10-CM | POA: Diagnosis not present

## 2023-06-14 DIAGNOSIS — K1370 Unspecified lesions of oral mucosa: Secondary | ICD-10-CM

## 2023-06-14 DIAGNOSIS — E1159 Type 2 diabetes mellitus with other circulatory complications: Secondary | ICD-10-CM

## 2023-06-14 LAB — URINALYSIS, ROUTINE W REFLEX MICROSCOPIC
Bilirubin Urine: NEGATIVE
Hgb urine dipstick: NEGATIVE
Ketones, ur: NEGATIVE
Nitrite: NEGATIVE
RBC / HPF: NONE SEEN (ref 0–?)
Specific Gravity, Urine: 1.01 (ref 1.000–1.030)
Total Protein, Urine: NEGATIVE
Urine Glucose: NEGATIVE
Urobilinogen, UA: 0.2 (ref 0.0–1.0)
pH: 6 (ref 5.0–8.0)

## 2023-06-14 MED ORDER — NYSTATIN 100000 UNIT/ML MT SUSP: 5.0000 mL | Freq: Three times a day (TID) | OROMUCOSAL | 0 refills | Status: DC

## 2023-06-14 NOTE — Assessment & Plan Note (Signed)
 Follows with endocrinology.  Most recent A1c at goal on Mounjaro , metformin , and insulin .

## 2023-06-14 NOTE — Patient Instructions (Signed)
 It was very nice to see you today!  Please start Magic mouthwash 3 times daily.  Let us  know if not improving by next week.  We will check a urine sample today.  Return if symptoms worsen or fail to improve.   Take care, Dr Daneil Dunker  PLEASE NOTE:  If you had any lab tests, please let us  know if you have not heard back within a few days. You may see your results on mychart before we have a chance to review them but we will give you a call once they are reviewed by us .   If we ordered any referrals today, please let us  know if you have not heard from their office within the next week.   If you had any urgent prescriptions sent in today, please check with the pharmacy within an hour of our visit to make sure the prescription was transmitted appropriately.   Please try these tips to maintain a healthy lifestyle:  Eat at least 3 REAL meals and 1-2 snacks per day.  Aim for no more than 5 hours between eating.  If you eat breakfast, please do so within one hour of getting up.   Each meal should contain half fruits/vegetables, one quarter protein, and one quarter carbs (no bigger than a computer mouse)  Cut down on sweet beverages. This includes juice, soda, and sweet tea.   Drink at least 1 glass of water with each meal and aim for at least 8 glasses per day  Exercise at least 150 minutes every week.

## 2023-06-14 NOTE — Assessment & Plan Note (Signed)
 At goal today on irbesartan  HCTZ 150-12.5 once daily and metoprolol  succinate 25 mg daily.

## 2023-06-14 NOTE — Progress Notes (Signed)
   Beth Holden is a 78 y.o. female who presents today for an office visit.  Assessment/Plan:  New/Acute Problems: Mouth Lesion  No red flags.  She does have shallow ulcer on her left hard palate which may be related to thermal injury.  Will start Magic mouthwash to help with symptom management.  She has done well with this in the past.  She will let us  know if not improving by next week and we can refer to ENT at that time if needed.  Right flank pain No red flags.  No current pain.  Potentially musculoskeletal in etiology however patient states it does not feel like typical muscle or back pain.  Will check UA and urine culture.  If labs are negative and pain persist would consider imaging versus referral to sports medicine at that time.  Chronic Problems Addressed Today: Hypertension associated with diabetes (HCC) At goal today on irbesartan  HCTZ 150-12.5 once daily and metoprolol  succinate 25 mg daily.  T2DM (type 2 diabetes mellitus) (HCC) Follows with endocrinology.  Most recent A1c at goal on Mounjaro , metformin , and insulin .     Subjective:  HPI:  See Assessment / plan for status of chronic conditions. She is here today with mouth sore. This started about a week ago after eating a piece of dressing that was hot and thinks that she burned her mouth. Over the next few days she has had continued spreading lesion in her mouth.  Area is sore to touch.  No treatments tried.  No other obvious aggravating or relieving factors.  She also has had intermittent right flank pain over the last week or so.  Pain comes and goes.  No obvious aggravating or alleviating factors.  No reported hematuria.  No fevers or chills.  No treatments tried.        Objective:  Physical Exam: BP 107/65   Pulse 65   Temp (!) 97.2 F (36.2 C) (Temporal)   Ht 5\' 6"  (1.676 m)   Wt 184 lb 12.8 oz (83.8 kg)   SpO2 100%   BMI 29.83 kg/m   Gen: No acute distress, resting comfortably HEENT: Approximately  1 cm shallow ulcerated lesion on left hard palate CV: Regular rate and rhythm with no murmurs appreciated Pulm: Normal work of breathing, clear to auscultation bilaterally with no crackles, wheezes, or rhonchi MUSCULOSKELETAL:  - Back: No deformities.  Nontender to palpation.  No CVA tenderness. Neuro: Grossly normal, moves all extremities Psych: Normal affect and thought content      [name] M. Daneil Dunker, MD 06/14/2023 9:00 AM

## 2023-06-15 ENCOUNTER — Ambulatory Visit (INDEPENDENT_AMBULATORY_CARE_PROVIDER_SITE_OTHER): Admitting: Orthopedic Surgery

## 2023-06-15 DIAGNOSIS — L97511 Non-pressure chronic ulcer of other part of right foot limited to breakdown of skin: Secondary | ICD-10-CM

## 2023-06-16 ENCOUNTER — Ambulatory Visit: Payer: Self-pay | Admitting: Family Medicine

## 2023-06-16 LAB — URINE CULTURE
MICRO NUMBER:: 16538208
Result:: NO GROWTH
SPECIMEN QUALITY:: ADEQUATE

## 2023-06-16 NOTE — Progress Notes (Signed)
 Her urine culture is negative.  She does not have a UTI.  No signs of a kidney stone.  She should let us  know if her flank pain is not improving.

## 2023-06-18 ENCOUNTER — Encounter: Payer: Self-pay | Admitting: Orthopedic Surgery

## 2023-06-18 NOTE — Progress Notes (Signed)
 Office Visit Note   [name]           Date of Birth: 06-09-1945           MRN: [id number] Visit Date: 06/15/2023              Requested by: Rodney Clamp, MD [address] [place],  [place] [address] PCP: Rodney Clamp, MD  Chief Complaint  Patient presents with   Right Foot - Routine Post Op    05/31/2023 right 3rd ray amp      HPI: Patient is a 78 year old woman who presents status post right third ray amputation for chronic ulcer beneath the third metatarsal head.  Assessment & Plan: Visit Diagnoses:  1. Non-pressure chronic ulcer of other part of right foot limited to breakdown of skin (HCC)     Plan: Ulcer is healed.  Incision is healed.  Follow-Up Instructions: Return in about 4 weeks (around 07/13/2023).   Ortho Exam  Patient is alert, oriented, no adenopathy, well-dressed, normal affect, normal respiratory effort. Examination the incision ulcer have healed.  Sutures are harvested.    Imaging: No results found.   Labs: Lab Results  Component Value Date   HGBA1C 6.4 (A) 04/03/2023   HGBA1C 6.7 (H) 09/21/2022   HGBA1C 6.5 07/13/2022   ESRSEDRATE 51 (H) 12/24/2019   ESRSEDRATE 36 (H) 02/24/2017   CRP 13.2 (H) 12/24/2019   CRP 1.2 (H) 02/24/2017   LABURIC 5.5 09/01/2015   REPTSTATUS 06/03/2020 FINAL 05/29/2020   CULT  05/29/2020    NO GROWTH 5 DAYS Performed at [facility], [address], [place], [place] [address]    LABORGA GROUP B STREP (S.AGALACTIAE) ISOLATED 12/26/2014     Lab Results  Component Value Date   ALBUMIN 4.1 11/05/2021   ALBUMIN 4.1 11/05/2021   ALBUMIN 3.8 09/08/2020   PREALBUMIN 19.4 03/05/2012    No results found for: "MG" No results found for: "VD25OH"  Lab Results  Component Value Date   PREALBUMIN 19.4 03/05/2012      Latest Ref Rng & Units 05/31/2023    8:09 AM 12/16/2022    7:59 AM 09/16/2022    7:46 AM  CBC EXTENDED  WBC 4.0 - 10.5 K/uL 10.3  10.6  12.4   RBC 3.87 - 5.11 MIL/uL 4.09   4.18  4.11   Hemoglobin 12.0 - 15.0 g/dL 43.3  29.5  18.8   HCT 36.0 - 46.0 % 36.0  37.0  35.7   Platelets 150 - 400 K/uL 225  417  400   NEUT# 1.7 - 7.7 K/uL  6.8  8.3   Lymph# 0.7 - 4.0 K/uL  2.8  2.9      There is no height or weight on file to calculate BMI.  Orders:  No orders of the defined types were placed in this encounter.  No orders of the defined types were placed in this encounter.    Procedures: No procedures performed  Clinical Data: No additional findings.  ROS:  All other systems negative, except as noted in the HPI. Review of Systems  Objective: Vital Signs: There were no vitals taken for this visit.  Specialty Comments:  No specialty comments available.  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) 10/23/2019   Nuclear sclerotic cataract of both eyes 10/23/2019   Posterior vitreous detachment of right eye 10/23/2019   Retinal hemorrhage of left eye 10/23/2019   Impingement syndrome of right shoulder 07/06/2017   Iron deficiency anemia 05/11/2017   Morbid obesity (HCC) 04/24/2017   Anemia 02/24/2017   Baker's cyst 07/10/2016   Onychomycosis 12/07/2015   Upper airway cough syndrome 03/05/2014   Hypertension associated with diabetes (HCC) 12/05/2006   T2DM (type 2 diabetes mellitus) (HCC) 10/12/2006   Dyslipidemia associated with type 2 diabetes mellitus (HCC) 10/12/2006   Allergic rhinitis 10/12/2006   GERD - Followed by Dr. Sandrea Cruel 10/12/2006   IRRITABLE BOWEL SYNDROME - followed by Dr. Sandrea Cruel 10/12/2006   Peripheral neuropathy 10/11/2006   ALOPECIA  NEC 10/11/2006   Past Medical History:  Diagnosis Date   Alopecia    Anemia, mild    Arthritis    Chronic cough    sees pulmonologist   Chronic pain    chest wall and abd - s/p extensive eval   Diabetes mellitus with neuropathy (HCC)    sees endocrine   Diverticulosis    Dyspnea    Fatty liver    GERD (gastroesophageal reflux disease)    takes Nexium  bid, hx erosive esophagitis   Headache(784.0)    occasionally;r/t sinus    History of colon polyps    HTN (hypertension)    Hx of amputation of lesser toe (HCC)    sees podiatrist   Hyperlipemia    IBS (irritable bowel syndrome)    Insomnia    takes Elavil  nightly   Joint pain    Neuropathy    Neuropathy    Osteomyelitis (HCC)    Pneumonia    89/2/22- patient denies   PONV (postoperative nausea and vomiting)    medication did not help with surgery on 03/28/20   Seasonal allergies    takes Zyrtec  daily   Sinus tachycardia     Family History  Problem Relation Age of Onset   Lung cancer Mother 36       smoked heavily   Emphysema Mother    Hypertension Father    Hyperlipidemia Father    Diabetes Father    Coronary artery disease Father    Dementia Father    COPD Father        smoked   Stomach cancer Paternal Aunt    Brain cancer Paternal Uncle    Irritable bowel syndrome Other        Several family members on fathers side    Diabetes Other    Stomach cancer Maternal Aunt    Lung cancer Paternal Uncle    Heart disease Paternal Uncle    Colon cancer Neg Hx     Past Surgical History:  Procedure Laterality Date   AMPUTATION  12/28/2011   Procedure: AMPUTATION DIGIT;  Surgeon: Timothy Ford, MD;  Location: [facility] OR;  Service: Orthopedics;  Laterality: Right;  Right Foot 2nd Toe Amputation at MTP (metatarsophalangeal joint)   AMPUTATION Right 04/25/2012   Procedure: Right Foot 3rd Toe Amputation;  Surgeon: Timothy Ford, MD;  [facility] OR;  Service: Orthopedics;  Laterality: Right;  Right Foot Third Toe Amputation     AMPUTATION Right 07/27/2012   Procedure: Right 4th Toe Amputation at Metatarsophalangeal;  Surgeon: Marshia Skene  Julio Ohm, MD;  Location: [facility] OR;  Service: Orthopedics;  Laterality: Right;  Right 4th Toe Amputation at Metatarsophalangeal   AMPUTATION Left 07/15/2016   Procedure: Left 2nd Ray Amputation;  Surgeon: Timothy Ford, MD;  Location: [facility] OR;  Service: Orthopedics;  Laterality: Left;   AMPUTATION Left 11/18/2016   Procedure: Left 3rd and 4th Ray Amputation;  Surgeon: Timothy Ford, MD;  Location: [facility] OR;  Service: Orthopedics;  Laterality: Left;   AMPUTATION Right 03/29/2017   Procedure: RIGHT FOOT 3RD AND 4TH RAY AMPUTATION;  Surgeon: Timothy Ford, MD;  Location: [facility] OR;  Service: Orthopedics;  Laterality: Right;   AMPUTATION Left 08/12/2020   Procedure: LEFT TRANSMETATARSAL AMPUTATION;  Surgeon: Timothy Ford, MD;  Location: [facility] OR;  Service: Orthopedics;  Laterality: Left;   AMPUTATION Right 05/31/2023   Procedure: AMPUTATION, FOOT, RAY;  Surgeon: Timothy Ford, MD;  Location: [facility] OR;  Service: Orthopedics;  Laterality: Right;  RIGHT 3RD METATARSAL RESECTION   APPLICATION OF WOUND VAC Left 08/12/2020   Procedure: APPLICATION OF WOUND VAC;  Surgeon: Timothy Ford, MD;  Location: [facility] OR;  Service: Orthopedics;  Laterality: Left;   COLONOSCOPY     LAPAROSCOPIC APPENDECTOMY  01/05/2011   Procedure: APPENDECTOMY LAPAROSCOPIC;  Surgeon: Azucena Bollard, MD;  Location: [facility] ORS;  Service: General;  Laterality: N/A;   OOPHORECTOMY  2001   ROTATOR CUFF REPAIR Right    x 2   TUBAL LIGATION     VAGINAL HYSTERECTOMY  2001   Social History   Occupational History   Occupation: Retired   Tobacco Use   Smoking status: Never   Smokeless tobacco: Never  Vaping Use   Vaping status: Never Used  Substance and Sexual Activity   Alcohol use: Never   Drug use: Never   Sexual activity: Yes    Birth control/protection: Surgical

## 2023-06-19 ENCOUNTER — Telehealth: Payer: Self-pay | Admitting: *Deleted

## 2023-06-19 ENCOUNTER — Ambulatory Visit: Admitting: Orthopedic Surgery

## 2023-06-19 NOTE — Telephone Encounter (Signed)
 Copied from CRM [id number]. Topic: Clinical - Prescription Issue >> Jun 16, 2023 12:15 PM Baldo Levan wrote: Reason for CRM: Patient called in stating she got a prescription for the Magic mouth wash, patient contacted her pharmacy and they stated they reached out to the office regarding needing the exact formula for the magic mouthwash. Patient is requesting the office reach out to the pharmacy and also the patient with an update.    Please advise Blythe Hartshorn,RMA

## 2023-06-20 ENCOUNTER — Encounter: Payer: Self-pay | Admitting: Nurse Practitioner

## 2023-06-20 ENCOUNTER — Other Ambulatory Visit (HOSPITAL_BASED_OUTPATIENT_CLINIC_OR_DEPARTMENT_OTHER): Payer: Self-pay

## 2023-06-20 ENCOUNTER — Inpatient Hospital Stay: Payer: Medicare Other | Attending: Oncology

## 2023-06-20 ENCOUNTER — Inpatient Hospital Stay (HOSPITAL_BASED_OUTPATIENT_CLINIC_OR_DEPARTMENT_OTHER): Payer: Medicare Other | Admitting: Oncology

## 2023-06-20 VITALS — BP 113/58 | HR 68 | Temp 98.1°F | Resp 18 | Ht 66.0 in | Wt 182.5 lb

## 2023-06-20 DIAGNOSIS — I10 Essential (primary) hypertension: Secondary | ICD-10-CM | POA: Insufficient documentation

## 2023-06-20 DIAGNOSIS — D509 Iron deficiency anemia, unspecified: Secondary | ICD-10-CM | POA: Diagnosis not present

## 2023-06-20 DIAGNOSIS — E119 Type 2 diabetes mellitus without complications: Secondary | ICD-10-CM | POA: Diagnosis not present

## 2023-06-20 LAB — CBC WITH DIFFERENTIAL (CANCER CENTER ONLY)
Abs Immature Granulocytes: 0.03 10*3/uL (ref 0.00–0.07)
Basophils Absolute: 0.1 10*3/uL (ref 0.0–0.1)
Basophils Relative: 1 %
Eosinophils Absolute: 0.2 10*3/uL (ref 0.0–0.5)
Eosinophils Relative: 2 %
HCT: 35.7 % — ABNORMAL LOW (ref 36.0–46.0)
Hemoglobin: 12.1 g/dL (ref 12.0–15.0)
Immature Granulocytes: 0 %
Lymphocytes Relative: 24 %
Lymphs Abs: 2.2 10*3/uL (ref 0.7–4.0)
MCH: 29.8 pg (ref 26.0–34.0)
MCHC: 33.9 g/dL (ref 30.0–36.0)
MCV: 87.9 fL (ref 80.0–100.0)
Monocytes Absolute: 0.6 10*3/uL (ref 0.1–1.0)
Monocytes Relative: 7 %
Neutro Abs: 6.2 10*3/uL (ref 1.7–7.7)
Neutrophils Relative %: 66 %
Platelet Count: 401 10*3/uL — ABNORMAL HIGH (ref 150–400)
RBC: 4.06 MIL/uL (ref 3.87–5.11)
RDW: 12.8 % (ref 11.5–15.5)
WBC Count: 9.4 10*3/uL (ref 4.0–10.5)
nRBC: 0 % (ref 0.0–0.2)

## 2023-06-20 LAB — FERRITIN: Ferritin: 269 ng/mL (ref 11–307)

## 2023-06-20 MED ORDER — NYSTATIN 100000 UNIT/ML MT SUSP
5.0000 mL | Freq: Four times a day (QID) | OROMUCOSAL | 1 refills | Status: DC | PRN
  Filled 2023-06-20: qty 140, 7d supply, fill #0

## 2023-06-20 NOTE — Telephone Encounter (Signed)
 Magic mouth wash should be one part 500,000U/ml nystatin , one part 1 mg/ml hydrocortisone , one part 12.5 mg/40ml diphenhydramine , and one part 2% lidocaine .  Swish and spit 3 times daily. Dispense 160ml with no refills.

## 2023-06-20 NOTE — Progress Notes (Signed)
  [facility] OFFICE PROGRESS NOTE   Diagnosis: Iron deficiency anemia  INTERVAL HISTORY:   Beth Holden returns as scheduled.  She reports the right foot ulcer has healed after metatarsal surgery last month.  She has irregular bowel habits since starting Mounjaro .  No bleeding.  She is taking iron.  She developed mouth sores last week.  She was prescribed Magic mouthwash by Dr. Daneil Dunker, but has not received the prescription.  The mouth ulcers are resolving. She has intermittent pain in the right lower abdomen since starting Mounjaro . Objective:  Vital signs in last 24 hours:  Blood pressure (!) 113/58, pulse 68, temperature 98.1 F (36.7 C), temperature source Temporal, resp. rate 18, height 5\' 6"  (1.676 m), weight 182 lb 8 oz (82.8 kg), SpO2 100%.    HEENT: Single healing ulcer at the left mid palate, no thrush, no buccal ulcers Resp: Lungs clear bilaterally Cardio: Regular rate and rhythm GI: No hepatosplenomegaly, nontender, no mass Vascular: No leg edema  Lab Results:  Lab Results  Component Value Date   WBC 9.4 06/20/2023   HGB 12.1 06/20/2023   HCT 35.7 (L) 06/20/2023   MCV 87.9 06/20/2023   PLT 401 (H) 06/20/2023   NEUTROABS 6.2 06/20/2023    CMP  Lab Results  Component Value Date   NA 139 05/31/2023   K 3.9 05/31/2023   CL 104 05/31/2023   CO2 23 05/31/2023   GLUCOSE 127 (H) 05/31/2023   BUN 18 05/31/2023   CREATININE 1.02 (H) 05/31/2023   CALCIUM  9.4 05/31/2023   PROT 7.1 11/05/2021   ALBUMIN 4.1 11/05/2021   ALBUMIN 4.1 11/05/2021   AST 15 11/05/2021   ALT 11 11/05/2021   ALKPHOS 40 11/05/2021   BILITOT 0.4 11/05/2021   GFRNONAA 57 (L) 05/31/2023   GFRAA 78 11/23/2018    Medications: I have reviewed the patient's current medications.   Assessment/Plan:  Anemia secondary to iron deficiency, possible GI blood loss related to gastritis status post IV ferrous gluconate weekly x4 beginning 03/13/2017, single dose of IV ferrous gluconate  05/22/2017.  Hemoglobin/MCV corrected into normal range. Feraheme  03/29/2021, 04/15/2021 Upper endoscopy 02/15/2017-diffuse moderate inflammation characterized by congestion, erythema, friability and granularity in the gastric body, posterior wall of the stomach and gastric antrum.  Biopsy GASTRIC ANTRAL MUCOSA WITH NON-SPECIFIC REACTIVE GASTROPATHY AND FEW SUBEPITHELIAL CRYSTALLINE IRON DEPOSITS, CONSISTENT WITH IRON PILL GASTROPATHY. GASTRIC OXYNTIC MUCOSA WITH PARIETAL CELL HYPERPLASIA AS CAN BE SEEN IN HYPERGASTRINEMIC STATES SUCH AS PPI THERAPY. WARTHIN-STARRY STAIN IS NEGATIVE FOR HELICOBACTER PYLORI Upper endoscopy 11/01/2022: Erythematous mucosa in the gastric fundus and body, duodenal biopsies negative for evidence of gluten sensitive enteropathy Colonoscopy 02/15/2017-6 mm polyp in the transverse colon (TUBULAR ADENOMA. NEGATIVE FOR HIGH GRADE DYSPLASIA OR MALIGNANCY) Colonoscopy 11/01/2022: Single angioectasia without bleeding in the ascending colon Diabetes Hypertension Mild neutrophilia- chronic, white count dating at least to 2012 on review of epic chart Small hemoglobin on repeat urinalyses-seen by urology.    Disposition: Ms. Charrier has a history of iron deficiency anemia.  She has not received IV iron for the past 2 years.  The hemoglobin remains in the low normal range and the ferritin level is normal.  She will continue oral iron.  She will return for an office and lab visit in 8 months.  She will call for symptoms of anemia.  Coni Deep, MD  06/20/2023  8:42 AM

## 2023-06-20 NOTE — Telephone Encounter (Signed)
 Prescription was send in today by Sumner Ends, MD

## 2023-06-21 ENCOUNTER — Other Ambulatory Visit (HOSPITAL_BASED_OUTPATIENT_CLINIC_OR_DEPARTMENT_OTHER): Payer: Self-pay

## 2023-07-13 ENCOUNTER — Ambulatory Visit: Admitting: Orthopedic Surgery

## 2023-07-13 ENCOUNTER — Encounter: Payer: Self-pay | Admitting: Orthopedic Surgery

## 2023-07-13 DIAGNOSIS — L97511 Non-pressure chronic ulcer of other part of right foot limited to breakdown of skin: Secondary | ICD-10-CM

## 2023-07-13 NOTE — Progress Notes (Signed)
 Office Visit Note   [name]           Date of Birth: 1945-09-16           MRN: [id number] Visit Date: 07/13/2023              Requested by: Kennyth Worth HERO, MD [address] [place],  [place] [address] PCP: Kennyth Worth HERO, MD  Chief Complaint  Patient presents with   Right Foot - Routine Post Op    05/31/2023 right 3rd ray amp      HPI: Patient is a 78 year old woman who presents 6 weeks status post right foot third ray amputation.  Assessment & Plan: Visit Diagnoses:  1. Non-pressure chronic ulcer of other part of right foot limited to breakdown of skin (HCC)     Plan: Patient's wound is completely healed.  She will follow-up as needed.  Follow-Up Instructions: Return if symptoms worsen or fail to improve.   Ortho Exam  Patient is alert, oriented, no adenopathy, well-dressed, normal affect, normal respiratory effort. Examination the surgical incision and the chronic plantar ulcer have completely healed.  There is no redness no cellulitis no signs of infection.  Left transmetatarsal amputation shows no ulcer.    Imaging: No results found. No images are attached to the encounter.  Labs: Lab Results  Component Value Date   HGBA1C 6.4 (A) 04/03/2023   HGBA1C 6.7 (H) 09/21/2022   HGBA1C 6.5 07/13/2022   ESRSEDRATE 51 (H) 12/24/2019   ESRSEDRATE 36 (H) 02/24/2017   CRP 13.2 (H) 12/24/2019   CRP 1.2 (H) 02/24/2017   LABURIC 5.5 09/01/2015   REPTSTATUS 06/03/2020 FINAL 05/29/2020   CULT  05/29/2020    NO GROWTH 5 DAYS Performed at [facility], [address] [place], [place] [address]    LABORGA GROUP B STREP (S.AGALACTIAE) ISOLATED 12/26/2014     Lab Results  Component Value Date   ALBUMIN 4.1 11/05/2021   ALBUMIN 4.1 11/05/2021   ALBUMIN 3.8 09/08/2020   PREALBUMIN 19.4 03/05/2012    No results found for: MG No results found for: [facility]  Lab Results  Component Value Date   PREALBUMIN 19.4 03/05/2012      Latest Ref  Rng & Units 06/20/2023    8:05 AM 05/31/2023    8:09 AM 12/16/2022    7:59 AM  CBC EXTENDED  WBC 4.0 - 10.5 K/uL 9.4  10.3  10.6   RBC 3.87 - 5.11 MIL/uL 4.06  4.09  4.18   Hemoglobin 12.0 - 15.0 g/dL 87.8  87.8  87.9   HCT 36.0 - 46.0 % 35.7  36.0  37.0   Platelets 150 - 400 K/uL 401  225  417   NEUT# 1.7 - 7.7 K/uL 6.2   6.8   Lymph# 0.7 - 4.0 K/uL 2.2   2.8      There is no height or weight on file to calculate BMI.  Orders:  No orders of the defined types were placed in this encounter.  No orders of the defined types were placed in this encounter.    Procedures: No procedures performed  Clinical Data: No additional findings.  ROS:  All other systems negative, except as noted in the HPI. Review of Systems  Objective: Vital Signs: There were no vitals taken for this visit.  Specialty Comments:  No specialty comments available.  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) 10/23/2019   Nuclear sclerotic cataract of both eyes 10/23/2019   Posterior vitreous detachment of right eye 10/23/2019   Retinal hemorrhage of left eye 10/23/2019   Impingement syndrome of right shoulder 07/06/2017   Iron deficiency anemia 05/11/2017   Morbid obesity (HCC) 04/24/2017   Anemia 02/24/2017   Baker's cyst 07/10/2016   Onychomycosis 12/07/2015   Upper airway cough syndrome 03/05/2014   Hypertension associated with diabetes (HCC) 12/05/2006   T2DM (type 2 diabetes mellitus) (HCC) 10/12/2006   Dyslipidemia associated with type 2 diabetes mellitus (HCC) 10/12/2006   Allergic rhinitis  10/12/2006   GERD - Followed by Dr. Aneita 10/12/2006   IRRITABLE BOWEL SYNDROME - followed by Dr. Aneita 10/12/2006   Peripheral neuropathy 10/11/2006   ALOPECIA NEC 10/11/2006   Past Medical History:  Diagnosis Date   Alopecia    Anemia, mild    Arthritis    Chronic cough    sees pulmonologist   Chronic pain    chest wall and abd - s/p extensive eval   Diabetes mellitus with neuropathy (HCC)    sees endocrine   Diverticulosis    Dyspnea    Fatty liver    GERD (gastroesophageal reflux disease)    takes Nexium  bid, hx erosive esophagitis   Headache(784.0)    occasionally;r/t sinus    History of colon polyps    HTN (hypertension)    Hx of amputation of lesser toe (HCC)    sees podiatrist   Hyperlipemia    IBS (irritable bowel syndrome)    Insomnia    takes Elavil  nightly   Joint pain    Neuropathy    Neuropathy    Osteomyelitis (HCC)    Pneumonia    89/2/22- patient denies   PONV (postoperative nausea and vomiting)    medication did not help with surgery on 03/28/20   Seasonal allergies    takes Zyrtec  daily   Sinus tachycardia     Family History  Problem Relation Age of Onset   Lung cancer Mother 5       smoked heavily   Emphysema Mother    Hypertension Father    Hyperlipidemia Father    Diabetes Father    Coronary artery disease Father    Dementia Father    COPD Father        smoked   Stomach cancer Paternal Aunt    Brain cancer Paternal Uncle    Irritable bowel syndrome Other        Several family members on fathers side    Diabetes Other    Stomach cancer Maternal Aunt    Lung cancer Paternal Uncle    Heart disease Paternal Uncle    Colon cancer Neg Hx     Past Surgical History:  Procedure Laterality Date   AMPUTATION  12/28/2011   Procedure: AMPUTATION DIGIT;  Surgeon: Jerona LULLA Sage, MD;  Location: [facility] OR;  Service: Orthopedics;  Laterality: Right;  Right Foot 2nd Toe Amputation at MTP (metatarsophalangeal joint)   AMPUTATION Right 04/25/2012    Procedure: Right Foot 3rd Toe Amputation;  Surgeon: Jerona LULLA Sage, MD;  Location: [facility] OR;  Service: Orthopedics;  Laterality:  Right;  Right Foot Third Toe Amputation    AMPUTATION Right 07/27/2012   Procedure: Right 4th Toe Amputation at Metatarsophalangeal;  Surgeon: Jerona LULLA Sage, MD;  Location: [facility] OR;  Service: Orthopedics;  Laterality: Right;  Right 4th Toe Amputation at Metatarsophalangeal   AMPUTATION Left 07/15/2016   Procedure: Left 2nd Ray Amputation;  Surgeon: Sage Jerona LULLA, MD;  Location: [facility] OR;  Service: Orthopedics;  Laterality: Left;   AMPUTATION Left 11/18/2016   Procedure: Left 3rd and 4th Ray Amputation;  Surgeon: Sage Jerona LULLA, MD;  Location: [facility] OR;  Service: Orthopedics;  Laterality: Left;   AMPUTATION Right 03/29/2017   Procedure: RIGHT FOOT 3RD AND 4TH RAY AMPUTATION;  Surgeon: Sage Jerona LULLA, MD;  Location: [facility] OR;  Service: Orthopedics;  Laterality: Right;   AMPUTATION Left 08/12/2020   Procedure: LEFT TRANSMETATARSAL AMPUTATION;  Surgeon: Sage Jerona LULLA, MD;  Location: [facility] OR;  Service: Orthopedics;  Laterality: Left;   AMPUTATION Right 05/31/2023   Procedure: AMPUTATION, FOOT, RAY;  Surgeon: Sage Jerona LULLA, MD;  Location: [facility] OR;  Service: Orthopedics;  Laterality: Right;  RIGHT 3RD METATARSAL RESECTION   APPLICATION OF WOUND VAC Left 08/12/2020   Procedure: APPLICATION OF WOUND VAC;  Surgeon: Sage Jerona LULLA, MD;  Location: [facility] OR;  Service: Orthopedics;  Laterality: Left;   COLONOSCOPY     LAPAROSCOPIC APPENDECTOMY  01/05/2011   Procedure: APPENDECTOMY LAPAROSCOPIC;  Surgeon: Donnice KATHEE Lunger, MD;  Location: [facility] ORS;  Service: General;  Laterality: N/A;   OOPHORECTOMY  2001   ROTATOR CUFF REPAIR Right    x 2   TUBAL LIGATION     VAGINAL HYSTERECTOMY  2001   Social History   Occupational History   Occupation: Retired   Tobacco Use   Smoking status: Never   Smokeless tobacco: Never  Vaping Use   Vaping status: Never Used  Substance and Sexual Activity   Alcohol use: Never    Drug use: Never   Sexual activity: Yes    Birth control/protection: Surgical

## 2023-08-02 DIAGNOSIS — H0102A Squamous blepharitis right eye, upper and lower eyelids: Secondary | ICD-10-CM | POA: Diagnosis not present

## 2023-08-02 DIAGNOSIS — H43811 Vitreous degeneration, right eye: Secondary | ICD-10-CM | POA: Diagnosis not present

## 2023-08-02 DIAGNOSIS — E119 Type 2 diabetes mellitus without complications: Secondary | ICD-10-CM | POA: Diagnosis not present

## 2023-08-02 DIAGNOSIS — H0102B Squamous blepharitis left eye, upper and lower eyelids: Secondary | ICD-10-CM | POA: Diagnosis not present

## 2023-08-02 DIAGNOSIS — Z961 Presence of intraocular lens: Secondary | ICD-10-CM | POA: Diagnosis not present

## 2023-08-02 DIAGNOSIS — R519 Headache, unspecified: Secondary | ICD-10-CM | POA: Diagnosis not present

## 2023-08-02 LAB — HM DIABETES EYE EXAM

## 2023-08-09 ENCOUNTER — Encounter: Payer: Self-pay | Admitting: Internal Medicine

## 2023-08-09 ENCOUNTER — Ambulatory Visit (INDEPENDENT_AMBULATORY_CARE_PROVIDER_SITE_OTHER): Admitting: Internal Medicine

## 2023-08-09 VITALS — BP 112/58 | HR 69 | Ht 66.0 in | Wt 186.2 lb

## 2023-08-09 DIAGNOSIS — Z7985 Long-term (current) use of injectable non-insulin antidiabetic drugs: Secondary | ICD-10-CM | POA: Diagnosis not present

## 2023-08-09 DIAGNOSIS — E1169 Type 2 diabetes mellitus with other specified complication: Secondary | ICD-10-CM

## 2023-08-09 DIAGNOSIS — Z7984 Long term (current) use of oral hypoglycemic drugs: Secondary | ICD-10-CM | POA: Diagnosis not present

## 2023-08-09 DIAGNOSIS — E1142 Type 2 diabetes mellitus with diabetic polyneuropathy: Secondary | ICD-10-CM

## 2023-08-09 DIAGNOSIS — E1165 Type 2 diabetes mellitus with hyperglycemia: Secondary | ICD-10-CM | POA: Diagnosis not present

## 2023-08-09 DIAGNOSIS — E785 Hyperlipidemia, unspecified: Secondary | ICD-10-CM | POA: Diagnosis not present

## 2023-08-09 DIAGNOSIS — G63 Polyneuropathy in diseases classified elsewhere: Secondary | ICD-10-CM

## 2023-08-09 LAB — POCT GLYCOSYLATED HEMOGLOBIN (HGB A1C): Hemoglobin A1C: 6.2 % — AB (ref 4.0–5.6)

## 2023-08-09 MED ORDER — TIRZEPATIDE 7.5 MG/0.5ML ~~LOC~~ SOAJ: 7.5000 mg | SUBCUTANEOUS | 3 refills | Status: AC

## 2023-08-09 NOTE — Patient Instructions (Addendum)
 Please continue: - Metformin  1000 mg 2x a day   Insulin  Before breakfast Before lunch Before dinner At bedtime  Regular 10-16 - 10-16    Please try to increase: - Mounjaro  7.5 mg weekly  Please return in 3-4 months with your sugar log.

## 2023-08-09 NOTE — Progress Notes (Signed)
 Subjective:     Patient ID: Beth Holden, female   DOB: 08-Nov-1945, 78 y.o.   MRN:   HPI Beth Holden is a pleasant 78 y.o. woman 78 y.o. returning for f/u for DM2, dx ~2000, uncontrolled, insulin -dependent, with complications (diabetic peripheral neuropathy, multiple toe amputations and also L transmetatarsal amputation 08/2020 and R 3rd ray amputation 05/31/2023).  Last visit 4 months ago.  Interim history: No nausea, chest pain. She has constipation.SABRA  Her blurry vision improved after cataract surgery. She has increased urination, particularly nocturia.  She does drink a glass of water before going to bed. Last visit, she had a nonpressure ulcer on the right foot -since then, she had 3rd ray amputation 05/31/2023 by Dr. Harden. She feels much better.  Reviewed HbA1c levels: Lab Results  Component Value Date   HGBA1C 6.4 (A) 04/03/2023   HGBA1C 6.7 (H) 09/21/2022   HGBA1C 6.5 07/13/2022   She is on: - Metformin  1000 mg 2x a day - Tresiba  34 >> 44 units daily >> Off for 3 mo >> 12 units daily >> off  Insulin  Before breakfast Before lunch Before dinner At bedtime  Regular 25(-30) >> 10-20 >> 10-16 - 25(-30) >> 10-20 >> 10-16   - Mounjaro  - [place] approved >> 2.5 >> 5 mg weekly  She also tried: Farxiga  03/2020 -could not start b/c of  $$$Levemir  47 units in am and 37 units in pm >> $800 for 3 mo supply Januvia  100 mg daily - $450 for 3 mo Invokana  100 mg - $455 for 3 mo Took Byetta before. We could not use Trulicity  as she developed GI symptoms with it. We discussed about starting her on a VGo mechanical pump in the past. She had an appointment with diabetes education but decided not to pursue it. We also tried a sliding scale of regular insulin  in the past but she was not using it. We tried Trulicity  but she could not tolerate it due to nausea, diarrhea, constipation, weakness. She was previously on NPH, but changed to Tresiba  in 04/2021.  She checks her sugars 2-4 times a day:  - am:  107-241  >> 130-207 >> 90-168 (ave <140) >> 170-200 - 2h after b'fast : 141-201 >> 130-219 >> 98-160 >> n/c - prelunch: 115-174, 252 >> 92-179, 259 >> n/c >> 150-170 - 2-3h after lunch:177-309 >> 99-209 >> 69-219 >> n/c - before dinner: 82-200 >> 70-168, 206, 365 >> 90-160s >> 165-200 - after dinner: 141-232, 257 >> 64-289 >> 150-200 >> 180-200s - bedtime: 171-281, 380 >> 86-245 >> 58-201 >> see above - nighttime: 53, 75, 100-245, 373 >> 80-180 >> 66-203, 262 >> see above Lowest: 40s x3 (did not eat supper) >> ...  42 (07/2022) >> 42 >> 64.  Has hypoglycemia awareness in the 70s. Highest: 402 >> ... 365 >> 301 >> high 200s.  Meals: - Breakfast: egg, bacon, toast; grits; biscuit; fruit; sometimes skips >> 2 slices of toast + butter - Lunch: 1/2 PB sandwich or soup and yoghurt, sometimes crackers - Dinner: meat + vegetables + some starch - Snacks: fruit   + Mild CKD: BUN  Date Value Ref Range Status  05/31/2023 18 8 - 23 mg/dL Final   Creatinine, Ser  Date Value Ref Range Status  05/31/2023 1.02 (H) 0.44 - 1.00 mg/dL Final   No results found for: MICRALBCREAT On irbesartan .  -+ HL; latest lipid panel: 09/21/2022: 122/158/39/56 Lab Results  Component Value Date   CHOL 122 11/05/2021   HDL  39.90 11/05/2021   LDLCALC 50 11/05/2021   TRIG 159.0 (H) 11/05/2021   CHOLHDL 3 11/05/2021  On rosuvastatin  20. She has fatty liver >> was advised to lose weight.  - last eye exam: 03/2023: + mild DR in OU reportedly. Prev. Mild NPDR OU, without macular edema (Dr. Elner referred him to Dr. Octavia). She had cataract surgery in 06/2023.  -+ Numbness and tingling in feet-previously on amitriptyline , then on Neurontin  >> tapered to off.  Last foot exam 09/02/2021.  She sees Dr. Harden. She weaned herself off Neurontin  as she felt that the pain in her feet was increased on it.  She still has some pain but improved.    She was able to lose 23 pounds immediately after starting Mounjaro .  She has some  constipation and decreased appetite. On Miralax + ExLax.  Lipase was normal at 03/10/2023.  She lost her husband 01/22/2023 (COPD).  They were married for 58 years.  Review of Systems  + see HPI  I reviewed pt's medications, allergies, PMH, social hx, family hx, and changes were documented in the history of present illness. Otherwise, unchanged from my initial visit note.  Past Medical History:  Diagnosis Date   Alopecia    Anemia, mild    Arthritis    Chronic cough    sees pulmonologist   Chronic pain    chest wall and abd - s/p extensive eval   Diabetes mellitus with neuropathy (HCC)    sees endocrine   Diverticulosis    Dyspnea    Fatty liver    GERD (gastroesophageal reflux disease)    takes Nexium  bid, hx erosive esophagitis   Headache(784.0)    occasionally;r/t sinus    History of colon polyps    HTN (hypertension)    Hx of amputation of lesser toe (HCC)    sees podiatrist   Hyperlipemia    IBS (irritable bowel syndrome)    Insomnia    takes Elavil  nightly   Joint pain    Neuropathy    Neuropathy    Osteomyelitis (HCC)    Pneumonia    89/2/22- patient denies   PONV (postoperative nausea and vomiting)    medication did not help with surgery on 03/28/20   Seasonal allergies    takes Zyrtec  daily   Sinus tachycardia    Past Surgical History:  Procedure Laterality Date   AMPUTATION  12/28/2011   Procedure: AMPUTATION DIGIT;  Surgeon: Jerona LULLA Harden, MD;  Location: [facility] OR;  Service: Orthopedics;  Laterality: Right;  Right Foot 2nd Toe Amputation at MTP (metatarsophalangeal joint)   AMPUTATION Right 04/25/2012   Procedure: Right Foot 3rd Toe Amputation;  Surgeon: Jerona LULLA Harden, MD;  Location: [facility] OR;  Service: Orthopedics;  Laterality: Right;  Right Foot Third Toe Amputation    AMPUTATION Right 07/27/2012   Procedure: Right 4th Toe Amputation at Metatarsophalangeal;  Surgeon: Jerona LULLA Harden, MD;  Location: [facility] OR;  Service: Orthopedics;  Laterality: Right;  Right 4th Toe  Amputation at Metatarsophalangeal   AMPUTATION Left 07/15/2016   Procedure: Left 2nd Ray Amputation;  Surgeon: Harden Jerona LULLA, MD;  Location: [facility] OR;  Service: Orthopedics;  Laterality: Left;   AMPUTATION Left 11/18/2016   Procedure: Left 3rd and 4th Ray Amputation;  Surgeon: Harden Jerona LULLA, MD;  Location: [facility] OR;  Service: Orthopedics;  Laterality: Left;   AMPUTATION Right 03/29/2017   Procedure: RIGHT FOOT 3RD AND 4TH RAY AMPUTATION;  Surgeon: Harden Jerona LULLA, MD;  Location: [facility] OR;  Service: Orthopedics;  Laterality: Right;   AMPUTATION Left 08/12/2020   Procedure: LEFT TRANSMETATARSAL AMPUTATION;  Surgeon: Harden Jerona GAILS, MD;  Location: [facility] OR;  Service: Orthopedics;  Laterality: Left;   AMPUTATION Right 05/31/2023   Procedure: AMPUTATION, FOOT, RAY;  Surgeon: Harden Jerona GAILS, MD;  Location: [facility] OR;  Service: Orthopedics;  Laterality: Right;  RIGHT 3RD METATARSAL RESECTION   APPLICATION OF WOUND VAC Left 08/12/2020   Procedure: APPLICATION OF WOUND VAC;  Surgeon: Harden Jerona GAILS, MD;  Location: [facility] OR;  Service: Orthopedics;  Laterality: Left;   COLONOSCOPY     LAPAROSCOPIC APPENDECTOMY  01/05/2011   Procedure: APPENDECTOMY LAPAROSCOPIC;  Surgeon: Donnice KATHEE Lunger, MD;  Location: [facility] ORS;  Service: General;  Laterality: N/A;   OOPHORECTOMY  2001   ROTATOR CUFF REPAIR Right    x 2   TUBAL LIGATION     VAGINAL HYSTERECTOMY  2001   Social History   Socioeconomic History   Marital status: Married    Spouse name: Not on file   Number of children: 2   Years of education: Not on file   Highest education level: Not on file  Occupational History   Occupation: Retired   Tobacco Use   Smoking status: Never   Smokeless tobacco: Never  Vaping Use   Vaping status: Never Used  Substance and Sexual Activity   Alcohol use: Never   Drug use: Never   Sexual activity: Yes    Birth control/protection: Surgical  Other Topics Concern   Not on file  Social History Narrative   Caffeine daily    HSG, UNG-G no  diploma   Married '66   1 dtr- '78; 1 son '71; 2 grandchildren   Occupation: retired 04   Dad with alzheimers-had to place in [place] (summer '10)         Social Drivers of Corporate investment banker Strain: Not on file  Food Insecurity: Not on file  Transportation Needs: Not on file  Physical Activity: Not on file  Stress: Not on file  Social Connections: Not on file  Intimate Partner Violence: Not At Risk (03/10/2023)   Received from [facility]   HITS    Over the last 12 months how often did your partner physically hurt you?: Never    Over the last 12 months how often did your partner insult you or talk down to you?: Never    Over the last 12 months how often did your partner threaten you with physical harm?: Never    Over the last 12 months how often did your partner scream or curse at you?: Never   Current Outpatient Medications on File Prior to Visit  Medication Sig Dispense Refill   acetaminophen  (TYLENOL ) 500 MG tablet Take 500 mg by mouth every 8 (eight) hours as needed for mild pain (pain score 1-3) or moderate pain (pain score 4-6).     azelastine  (ASTELIN ) 0.1 % nasal spray Place 2 sprays into both nostrils 2 (two) times daily. (Patient taking differently: Place 2 sprays into both nostrils 2 (two) times daily as needed for allergies.) 30 mL 12   betamethasone  dipropionate (DIPROLENE ) 0.05 % cream Apply 1 application topically 2 (two) times daily as needed (IRRITATION). 30 g 0   Blood Glucose Monitoring Suppl (ONE TOUCH ULTRA 2) w/Device KIT Use to check blood sugar 3-4 times a day DxCode:E11.9 1 kit 0   Calcium  Carb-Cholecalciferol  (CALCIUM  + VITAMIN D3) 600-10 MG-MCG TABS Take 2 tablets by mouth daily.  90 tablet 3   cetirizine  (ZYRTEC ) 10 MG tablet Take 1 tablet (10 mg total) by mouth daily. 90 tablet 3   Cholecalciferol  (VITAMIN D3) 250 MCG (10000 UT) capsule Take 10,000 Units by mouth daily.     clotrimazole  (CLOTRIMAZOLE  AF) 1 % cream Apply 1 Application topically 2  (two) times daily. (Patient taking differently: Apply 1 Application topically 2 (two) times daily as needed (Rash).) 35.4 g 2   ferrous sulfate 325 (65 FE) MG tablet Take 325 mg by mouth daily with breakfast.     Fluocinolone  Acetonide 0.01 % OIL Place 1-2 drops into both ears daily. As needed (Patient taking differently: Place 1-2 drops into both ears 2 (two) times a week.) 20 mL 0   gabapentin  (NEURONTIN ) 100 MG capsule Take 200-300 mg by mouth at bedtime.     glucagon  (GLUCAGEN) 1 MG SOLR injection Inject 1 mg into the muscle once as needed for up to 1 dose for low blood sugar. 1 each 11   glucose blood (ONETOUCH ULTRA) test strip Use to check blood sugar 3-4 times a day DxCode:E11.9 400 each 5   Insulin  Pen Needle 32G X 4 MM MISC Use 1x a day 100 each 3   insulin  regular (NOVOLIN R RELION) 100 units/mL injection Inject 0.2-0.3 mLs (20-30 Units total) into the skin 3 (three) times daily before meals. Per sliding scale (Patient taking differently: Inject 10-15 Units into the skin 2 (two) times daily before a meal. Per sliding scale)     Insulin  Syringe-Needle U-100 (RELION INSULIN  SYRINGE 1ML/31G) 31G X 5/16 1 ML MISC USE 2 TIMES A DAY 100 each 2   irbesartan -hydrochlorothiazide  (AVALIDE) 150-12.5 MG tablet Take 1 tablet by mouth daily. 90 tablet 3   Lancets (ONETOUCH ULTRASOFT) lancets Use to check blood sugar 3-4 times a day DxCode:E11.9 100 each 12   magic mouthwash (nystatin , hydrocortisone , diphenhydrAMINE , lidocaine ) suspension Swish and spit 5 mLs 3 (three) times daily. 540 mL 0   magic mouthwash (nystatin , lidocaine , diphenhydrAMINE , alum & mag hydroxide) suspension Swish and swallow 5 mLs by mouth 4 (four) times daily as needed for mouth pain. 140 mL 1   metFORMIN  (GLUCOPHAGE ) 1000 MG tablet TAKE 1 TABLET TWICE DAILY  WITH MEALS 180 tablet 3   metoprolol  succinate (TOPROL -XL) 25 MG 24 hr tablet Take 1 tablet (25 mg total) by mouth daily. 90 tablet 3   omeprazole  (PRILOSEC) 40 MG capsule  TAKE 1 CAPSULE TWICE DAILY 180 capsule 3   OVER THE COUNTER MEDICATION Apply 1-3 application  topically at bedtime as needed (foot pain). TOPRICIN FOOT CREAM - NEUROPATHY FOOT CREAM **APPLIES TO BOTH FEET AT BEDTIME**     Probiotic Product (PROBIOTIC DAILY PO) Take 1 capsule by mouth daily.     rosuvastatin  (CRESTOR ) 20 MG tablet TAKE 1 TABLET DAILY (Patient taking differently: Take 20 mg by mouth at bedtime.) 90 tablet 1   silver  sulfADIAZINE  (SILVADENE ) 1 % cream Apply 1 Application topically daily. (Patient taking differently: Apply 1 Application topically daily as needed (Wound care).) 50 g 0   tirzepatide  (MOUNJARO ) 5 MG/0.5ML Pen Inject 5 mg into the skin once a week. 6 mL 3   vitamin B-12 (CYANOCOBALAMIN ) 1000 MCG tablet Take 1,000 mcg by mouth daily.     vitamin C (ASCORBIC ACID ) 500 MG tablet Take 500 mg by mouth in the morning.     No current facility-administered medications on file prior to visit.   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    Family History  Problem Relation Age of Onset   Lung cancer Mother 48       smoked heavily   Emphysema Mother    Hypertension Father    Hyperlipidemia Father    Diabetes Father    Coronary artery disease Father    Dementia Father    COPD Father        smoked   Stomach cancer Paternal Aunt    Brain cancer Paternal Uncle    Irritable bowel syndrome Other        Several family members on fathers side    Diabetes Other    Stomach cancer Maternal Aunt    Lung cancer Paternal Uncle    Heart disease Paternal Uncle    Colon cancer Neg Hx     Objective:   Physical Exam BP (!) 112/58   Pulse 69   Ht 5' 6 (1.676 m)   Wt 186 lb 3.2 oz (84.5 kg)   SpO2 96%   BMI 30.05 kg/m   Wt Readings from Last 3 Encounters:  08/09/23 186 lb 3.2 oz (84.5 kg)  06/20/23 182 lb 8 oz (82.8 kg)  06/14/23  184 lb 12.8 oz (83.8 kg)   Constitutional: overweight, in NAD Eyes: EOMI, no exophthalmos ENT: no thyromegaly, no cervical lymphadenopathy Cardiovascular: RRR, No MRG Respiratory: CTA B Musculoskeletal: + deformities (several amputations of toes, Charcot deformity of the left foot) Skin: no rashes Neurological: no tremor with outstretched hands  Assessment:     1. DM2, uncontrolled, insulin -dependent, with complications: - diabetic peripheral neuropathy - 3 toe amputations - after infected diabetic toe ulcers >> OM: R 2nd toe amputated on 12/28/2011 R 3rd toe amputated on 04/25/2012 R 4th toe amputated on 07/27/2012 L 2nd toe amputated on 07/15/2016 L1st and 5th toes amputated 11/18/2016 L transmetatarsal amputation 08/12/2020 R 3rd ray amputation 05/31/2023    2. 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 HL  Plan:     1. DM2 - pt with longstanding, uncontrolled, type 2 diabetes, on basal/bolus insulin  regimen previously, along with metformin , but currently on a GLP-1/GIP receptor agonist along with regular insulin  and metformin .  HbA1c at last visit was at goal, at 6.4%, improved despite being more erratic with her diet and her insulin  injections since her husband passed away 6 months ago.  However, sugars were much improved compared to before and she was not taking Tresiba .  I advised her to stay off Tresiba  but started back on Mounjaro . -At today's visit, she mentions constipation with Mounjaro .  She is using Ex-Lax.  We discussed about using MiraLAX and titrating the dose up for effect.  She would be interested in increasing the dose of Mounjaro  to help her more with weight loss and with her blood sugars.  However, she admits that the sugars are high in the last month that she relaxed her diet.  She would like to start working on her diet but will also increase the Mounjaro  dose to help her with this.  We can continue the rest of the regimen for now. - I advised her to: Patient Instructions  Please  continue: - Metformin  1000 mg 2x a day   Insulin  Before breakfast Before lunch Before dinner At bedtime  Regular 10-16 - 10-16    Please try to increase: - Mounjaro  7.5 mg weekly  Please return in 3-4 months with your sugar log.  - we checked her HbA1c: 6.2% (lower) -lower than expected from her sugars at home - advised to check sugars at different times of the day - 4x a day, rotating check times - advised for yearly eye exams >> she is UTD - return to clinic in 3-4 months  2. PN -Related to diabetes  - She has numbness and tingling mostly in the left big toe - Previously on amitriptyline , Neurontin , now off  3. HL - Reviewed latest lipid panel from 09/2022: Mostly at goal:122/158/39/56 - Continues Crestor  20 mg daily without side effects.  Lela Fendt, MD PhD [facility] Endocrinology

## 2023-08-10 ENCOUNTER — Ambulatory Visit: Payer: Self-pay | Admitting: Internal Medicine

## 2023-08-10 LAB — MICROALBUMIN / CREATININE URINE RATIO
Creatinine, Urine: 80 mg/dL (ref 20–275)
Microalb, Ur: <0.2 mg/dL

## 2023-08-21 IMAGING — DX DG SACRUM/COCCYX 2+V
3 series · 3 of 3 positions shown · non-contrast
Comparison: None.

CLINICAL DATA: Pain with walking and sitting.  Fall 5 days ago.

EXAM:
SACRUM AND COCCYX - 2+ VIEW

[sacrum ap]
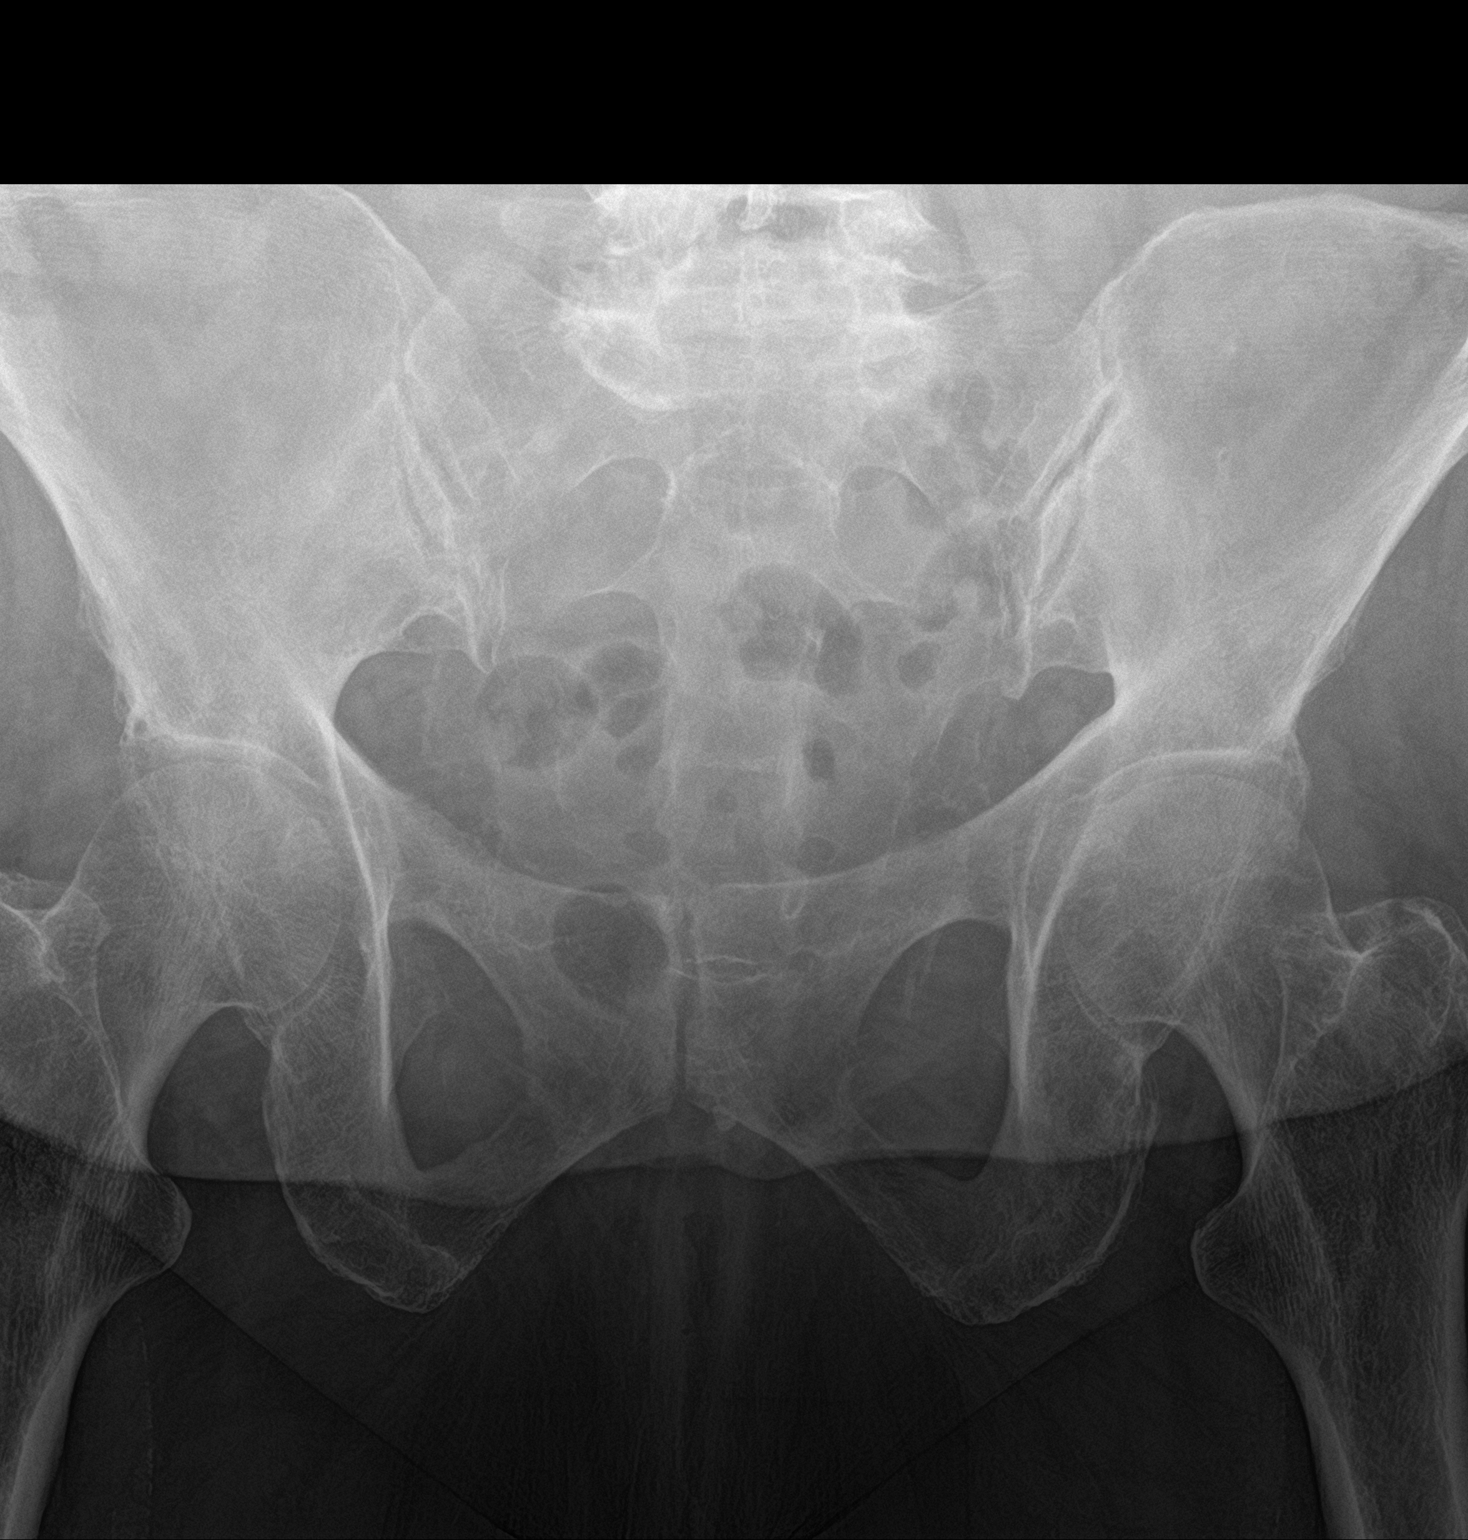

[coccyx ap]
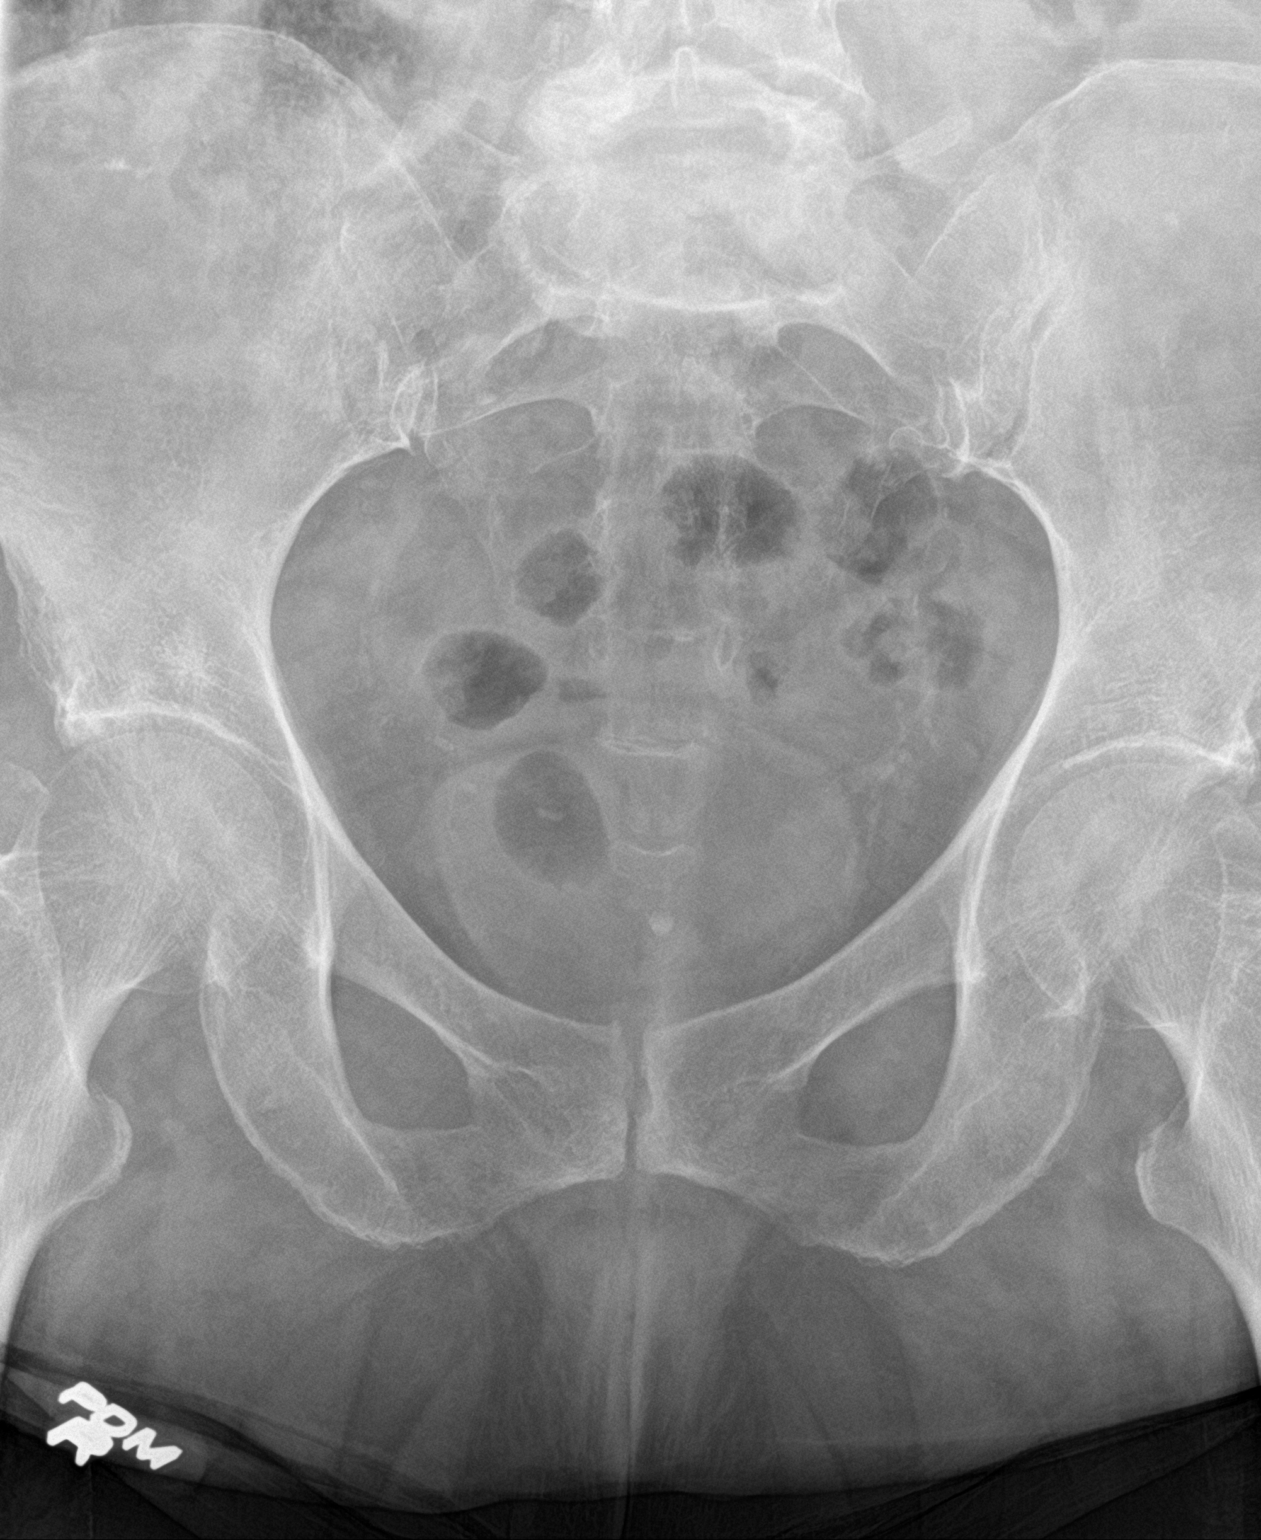

[sacrum lat]
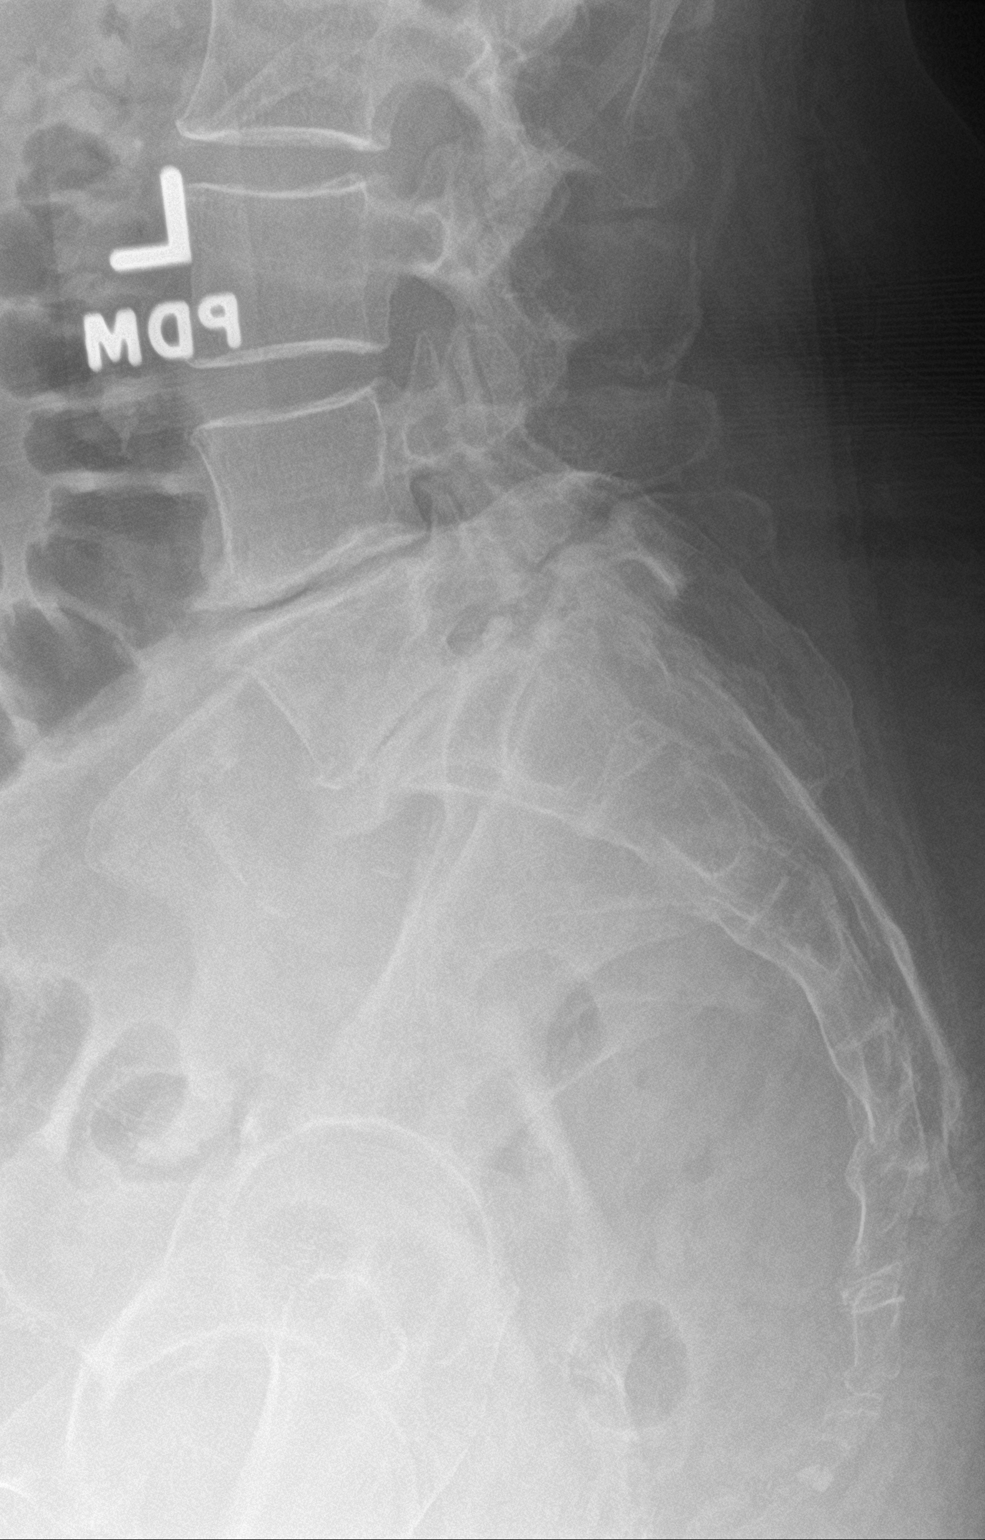

[3 of 3 positions shown; findings below may reference images not displayed]

FINDINGS: There is no evidence of fracture or other focal bone lesions.
IMPRESSION: Negative.

## 2023-08-23 ENCOUNTER — Other Ambulatory Visit: Payer: Self-pay | Admitting: Internal Medicine

## 2023-09-04 ENCOUNTER — Ambulatory Visit (INDEPENDENT_AMBULATORY_CARE_PROVIDER_SITE_OTHER): Admitting: Family Medicine

## 2023-09-04 ENCOUNTER — Encounter: Payer: Self-pay | Admitting: Nurse Practitioner

## 2023-09-04 ENCOUNTER — Other Ambulatory Visit (HOSPITAL_BASED_OUTPATIENT_CLINIC_OR_DEPARTMENT_OTHER): Payer: Self-pay

## 2023-09-04 VITALS — BP 112/52 | HR 70 | Temp 97.7°F | Ht 66.0 in | Wt 187.4 lb

## 2023-09-04 DIAGNOSIS — G4700 Insomnia, unspecified: Secondary | ICD-10-CM | POA: Insufficient documentation

## 2023-09-04 DIAGNOSIS — Z794 Long term (current) use of insulin: Secondary | ICD-10-CM | POA: Diagnosis not present

## 2023-09-04 DIAGNOSIS — J309 Allergic rhinitis, unspecified: Secondary | ICD-10-CM

## 2023-09-04 DIAGNOSIS — E1159 Type 2 diabetes mellitus with other circulatory complications: Secondary | ICD-10-CM

## 2023-09-04 MED ORDER — TRAZODONE HCL 50 MG PO TABS
25.0000 mg | ORAL_TABLET | Freq: Every day | ORAL | 5 refills | Status: AC
  Filled 2023-09-04: qty 30, 30d supply, fill #0

## 2023-09-04 MED ORDER — FEXOFENADINE HCL 180 MG PO TABS
180.0000 mg | ORAL_TABLET | Freq: Every day | ORAL | 3 refills | Status: AC
  Filled 2023-09-04: qty 100, 100d supply, fill #0

## 2023-09-04 NOTE — Progress Notes (Signed)
   Beth Holden is a 78 y.o. female who presents today for an office visit.  Assessment/Plan:  Chronic Problems Addressed Today: Insomnia Had lengthy discussion with patient today regarding her insomnia.  We did discuss potential treatment options.  Will start trazodone  25 to 50 mg nightly.  We discussed potential side effects.  Will also switch her Zyrtec  to Allegra .  She has had previous bad reactions with Benadryl  including increased somnolence.  She will let us  know if not improving in the next 2 weeks and we can adjust the dose of medication as tolerated.  T2DM (type 2 diabetes mellitus) (HCC) Follows with endocrinology.  She did have a few recent lows but overall A1c's are very well-controlled.  Will defer further management to endocrinology.  Allergic rhinitis She is on Zyrtec .  We we will switch this to Allegra  as this may be causing some issues with her insomnia.  She will follow-up with us  in a couple of weeks.     Subjective:  HPI:  See assessment / plan for status of chronic conditions.   Discussed the use of AI scribe software for clinical note transcription with the patient, who gave verbal consent to proceed.  History of Present Illness Beth Holden is a 78 year old female who presents with sleep disturbances and concerns about low blood sugar levels.  She has experienced significant sleep disturbances for the past two to three months, with a long-standing history of poor sleep that has recently worsened. She sometimes does not sleep at all or only manages two to three hours per night. Over the last few nights, she has managed four to five hours of sleep, which she acknowledges is still insufficient. The lack of sleep has led to increased irritability and difficulty managing daily stressors, particularly following the loss of her husband, who previously managed many household responsibilities.  She recounts a recent incident where she fell asleep in her  recliner and, upon waking, mistakenly believed it was the following morning, leading to confusion and distress. She worries about her mental clarity and fears she might be 'going crazy.'  She reports episodes of low blood sugar, with levels dropping into the sixties, occurring three to five times a month previously. She has been working on weight loss and has adjusted her insulin  regimen, reducing her insulin  dosage and discontinuing Levemir . She currently takes 10 to 16 units of insulin  twice daily, depending on her dietary intake, and has been on Mounjaro  since March of the previous year. Her A1c has improved to 6.2, although she notes occasional higher readings, such as 220, particularly after dietary indulgences like birthday cake.  She mentions frequent nighttime urination, needing to get up two to three times per night, but does not find it bothersome. She is concerned about taking sleep medications, particularly regarding her ability to wake up if her blood sugar drops.  She takes Zyrtec  for allergies but is open to switching to Allegra .         Objective:  Physical Exam: BP (!) 112/52   Pulse 70   Temp 97.7 F (36.5 C) (Oral)   Ht 5' 6 (1.676 m)   Wt 187 lb 6.4 oz (85 kg)   SpO2 97%   BMI 30.25 kg/m   Gen: No acute distress, resting comfortably Neuro: Grossly normal, moves all extremities Psych: Normal affect and thought content      Beth Holden M. Kennyth, MD 09/04/2023 2:52 PM

## 2023-09-04 NOTE — Assessment & Plan Note (Signed)
 Follows with endocrinology.  She did have a few recent lows but overall A1c's are very well-controlled.  Will defer further management to endocrinology.

## 2023-09-04 NOTE — Patient Instructions (Signed)
 It was very nice to see you today!  VISIT SUMMARY: Today, we addressed your sleep disturbances, concerns about low blood sugar levels, and allergies. We discussed your recent experiences and made adjustments to your medications to help improve your sleep and manage your diabetes and allergies more effectively.  YOUR PLAN: INSOMNIA: You have been experiencing significant sleep disturbances, which have worsened recently due to stress and life changes. -Start taking trazodone  50 mg, beginning with half a tablet to see how you tolerate it. -Be aware of potential side effects like next-day grogginess, dry mouth, and constipation. -Monitor your sleep quality and report back in 1-2 weeks via MyChart. -Switch from Zyrtec  to Allegra  to see if it improves your sleep.  TYPE 2 DIABETES MELLITUS WITH HYPOGLYCEMIA: Your diabetes is well-controlled with an A1c of 6.2, but you have occasional low blood sugar episodes. -Continue your current diabetes management with adjusted insulin  doses. -Monitor your blood glucose levels and dietary intake closely. -Consider further reducing your insulin  dosage if you continue to experience low blood sugar.  ALLERGIC RHINITIS: You have chronic allergies that are currently managed with Zyrtec , which may be affecting your sleep. -Switch to Allegra  to manage your allergy symptoms. -Monitor your allergy symptoms and sleep quality after making the switch.  Return if symptoms worsen or fail to improve.   Take care, Dr Kennyth  PLEASE NOTE:  If you had any lab tests, please let us  know if you have not heard back within a few days. You may see your results on mychart before we have a chance to review them but we will give you a call once they are reviewed by us .   If we ordered any referrals today, please let us  know if you have not heard from their office within the next week.   If you had any urgent prescriptions sent in today, please check with the pharmacy within an  hour of our visit to make sure the prescription was transmitted appropriately.   Please try these tips to maintain a healthy lifestyle:  Eat at least 3 REAL meals and 1-2 snacks per day.  Aim for no more than 5 hours between eating.  If you eat breakfast, please do so within one hour of getting up.   Each meal should contain half fruits/vegetables, one quarter protein, and one quarter carbs (no bigger than a computer mouse)  Cut down on sweet beverages. This includes juice, soda, and sweet tea.   Drink at least 1 glass of water with each meal and aim for at least 8 glasses per day  Exercise at least 150 minutes every week.

## 2023-09-04 NOTE — Assessment & Plan Note (Signed)
 Had lengthy discussion with patient today regarding her insomnia.  We did discuss potential treatment options.  Will start trazodone  25 to 50 mg nightly.  We discussed potential side effects.  Will also switch her Zyrtec  to Allegra .  She has had previous bad reactions with Benadryl  including increased somnolence.  She will let us  know if not improving in the next 2 weeks and we can adjust the dose of medication as tolerated.

## 2023-09-04 NOTE — Assessment & Plan Note (Signed)
 She is on Zyrtec .  We we will switch this to Allegra  as this may be causing some issues with her insomnia.  She will follow-up with us  in a couple of weeks.

## 2023-09-05 ENCOUNTER — Other Ambulatory Visit (HOSPITAL_BASED_OUTPATIENT_CLINIC_OR_DEPARTMENT_OTHER): Payer: Self-pay

## 2023-09-13 ENCOUNTER — Encounter: Payer: Self-pay | Admitting: Orthopedic Surgery

## 2023-09-13 ENCOUNTER — Other Ambulatory Visit (HOSPITAL_BASED_OUTPATIENT_CLINIC_OR_DEPARTMENT_OTHER): Payer: Self-pay

## 2023-09-13 ENCOUNTER — Ambulatory Visit (INDEPENDENT_AMBULATORY_CARE_PROVIDER_SITE_OTHER): Admitting: Family

## 2023-09-13 DIAGNOSIS — L02611 Cutaneous abscess of right foot: Secondary | ICD-10-CM | POA: Diagnosis not present

## 2023-09-13 DIAGNOSIS — Z89431 Acquired absence of right foot: Secondary | ICD-10-CM | POA: Diagnosis not present

## 2023-09-13 DIAGNOSIS — L97511 Non-pressure chronic ulcer of other part of right foot limited to breakdown of skin: Secondary | ICD-10-CM

## 2023-09-13 DIAGNOSIS — G609 Hereditary and idiopathic neuropathy, unspecified: Secondary | ICD-10-CM | POA: Diagnosis not present

## 2023-09-13 MED ORDER — SULFAMETHOXAZOLE-TRIMETHOPRIM 800-160 MG PO TABS
1.0000 | ORAL_TABLET | Freq: Two times a day (BID) | ORAL | 0 refills | Status: DC
  Filled 2023-09-13: qty 20, 10d supply, fill #0

## 2023-09-13 MED ORDER — FLUCONAZOLE 150 MG PO TABS
150.0000 mg | ORAL_TABLET | Freq: Once | ORAL | 0 refills | Status: AC
  Filled 2023-09-13: qty 3, 3d supply, fill #0

## 2023-09-13 NOTE — Progress Notes (Signed)
 Office Visit Note   [name]           Date of Birth: 12/29/1945           MRN: [id number] Visit Date: 09/13/2023              Requested by: Kennyth Worth HERO, MD [address] [place],  [place] [address] PCP: Kennyth Worth HERO, MD  Chief Complaint  Patient presents with   Right Foot - Pain      HPI: The patient is a 78 year old woman who presents today with a new ulcerative area to the right foot she states that she was having to do some raking in her yard was more active and noticed a pocket of fluid from the plantar medial border of her foot this has been increasingly painful   No fever no chills no redness  Assessment & Plan: Visit Diagnoses: No diagnosis found.  Plan: Will place her on a course of antibiotics given the purulent drainage she will offload the area daily dose of cleansing and dry dressings follow-up in office in 2 weeks with Dr. Harden  Follow-Up Instructions: No follow-ups on file.   Ortho Exam  Patient is alert, oriented, no adenopathy, well-dressed, normal affect, normal respiratory effort. On examination right foot there is a Wagner grade 1 ulcer beneath the medial border of the midfoot there is hyperkeratotic tissue and some adjacent abscess this is debrided with a 10 blade knife back to viable tissue.  Patient voiced relief there was purulent drainage expressed there is no surrounding erythema no warmth no ascending cellulitis  Ulcer measures 2 cm in diameter there is no exposed bone    Imaging: No results found. No images are attached to the encounter.  Labs: Lab Results  Component Value Date   HGBA1C 6.2 (A) 08/09/2023   HGBA1C 6.4 (A) 04/03/2023   HGBA1C 6.7 (H) 09/21/2022   ESRSEDRATE 51 (H) 12/24/2019   ESRSEDRATE 36 (H) 02/24/2017   CRP 13.2 (H) 12/24/2019   CRP 1.2 (H) 02/24/2017   LABURIC 5.5 09/01/2015   REPTSTATUS 06/03/2020 FINAL 05/29/2020   CULT  05/29/2020    NO GROWTH 5 DAYS Performed at [facility],  [address] [place], [place] [address]    LABORGA GROUP B STREP (S.AGALACTIAE) ISOLATED 12/26/2014     Lab Results  Component Value Date   ALBUMIN 4.1 11/05/2021   ALBUMIN 4.1 11/05/2021   ALBUMIN 3.8 09/08/2020   PREALBUMIN 19.4 03/05/2012    No results found for: MG No results found for: [facility]  Lab Results  Component Value Date   PREALBUMIN 19.4 03/05/2012      Latest Ref Rng & Units 06/20/2023    8:05 AM 05/31/2023    8:09 AM 12/16/2022    7:59 AM  CBC EXTENDED  WBC 4.0 - 10.5 K/uL 9.4  10.3  10.6   RBC 3.87 - 5.11 MIL/uL 4.06  4.09  4.18   Hemoglobin 12.0 - 15.0 g/dL 87.8  87.8  87.9   HCT 36.0 - 46.0 % 35.7  36.0  37.0   Platelets 150 - 400 K/uL 401  225  417   NEUT# 1.7 - 7.7 K/uL 6.2   6.8   Lymph# 0.7 - 4.0 K/uL 2.2   2.8      There is no height or weight on file to calculate BMI.  Orders:  No orders of the defined types were placed in this encounter.  Meds ordered this encounter  Medications   sulfamethoxazole -trimethoprim  (BACTRIM  DS) 800-160 MG tablet    Sig: Take 1 tablet by mouth 2 (two) times daily.    Dispense:  20 tablet    Refill:  0     Procedures: No procedures performed  Clinical Data: No additional findings.  ROS:  All other systems negative, except as noted in the HPI. Review of Systems  Objective: Vital Signs: There were no vitals taken for this visit.  Specialty Comments:  No specialty comments available.  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) 10/23/2019   Nuclear sclerotic cataract of both  eyes 10/23/2019   Posterior vitreous detachment of right eye 10/23/2019   Retinal hemorrhage of left eye 10/23/2019   Impingement syndrome of right shoulder 07/06/2017   Iron deficiency anemia 05/11/2017   Morbid obesity (HCC) 04/24/2017   Anemia 02/24/2017   Baker's cyst 07/10/2016   Onychomycosis 12/07/2015   Upper airway cough syndrome 03/05/2014   Hypertension associated with diabetes (HCC) 12/05/2006   T2DM (type 2 diabetes mellitus) (HCC) 10/12/2006   Dyslipidemia associated with type 2 diabetes mellitus (HCC) 10/12/2006   Allergic rhinitis 10/12/2006   GERD - Followed by Dr. Aneita 10/12/2006   IRRITABLE BOWEL SYNDROME - followed by Dr. Aneita 10/12/2006   Peripheral neuropathy 10/11/2006   ALOPECIA NEC 10/11/2006   Past Medical History:  Diagnosis Date   Alopecia    Anemia, mild    Arthritis    Chronic cough    sees pulmonologist   Chronic pain    chest wall and abd - s/p extensive eval   Diabetes mellitus with neuropathy (HCC)    sees endocrine   Diverticulosis    Dyspnea    Fatty liver    GERD (gastroesophageal reflux disease)    takes Nexium  bid, hx erosive esophagitis   Headache(784.0)    occasionally;r/t sinus    History of colon polyps    HTN (hypertension)    Hx of amputation of lesser toe (HCC)    sees podiatrist   Hyperlipemia    IBS (irritable bowel syndrome)    Insomnia    takes Elavil  nightly   Joint pain    Neuropathy    Neuropathy    Osteomyelitis (HCC)    Pneumonia    89/2/22- patient denies   PONV (postoperative nausea and vomiting)    medication did not help with surgery on 03/28/20   Seasonal allergies    takes Zyrtec  daily   Sinus tachycardia     Family History  Problem Relation Age of Onset   Lung cancer Mother 69       smoked heavily   Emphysema Mother    Hypertension Father    Hyperlipidemia Father    Diabetes Father    Coronary artery disease Father    Dementia Father    COPD Father        smoked   Stomach cancer  Paternal Aunt    Brain cancer Paternal Uncle    Irritable bowel syndrome Other        Several family members on fathers  side    Diabetes Other    Stomach cancer Maternal Aunt    Lung cancer Paternal Uncle    Heart disease Paternal Uncle    Colon cancer Neg Hx     Past Surgical History:  Procedure Laterality Date   AMPUTATION  12/28/2011   Procedure: AMPUTATION DIGIT;  Surgeon: Jerona LULLA Sage, MD;  Location: [facility] OR;  Service: Orthopedics;  Laterality: Right;  Right Foot 2nd Toe Amputation at MTP (metatarsophalangeal joint)   AMPUTATION Right 04/25/2012   Procedure: Right Foot 3rd Toe Amputation;  Surgeon: Jerona LULLA Sage, MD;  Location: [facility] OR;  Service: Orthopedics;  Laterality: Right;  Right Foot Third Toe Amputation    AMPUTATION Right 07/27/2012   Procedure: Right 4th Toe Amputation at Metatarsophalangeal;  Surgeon: Jerona LULLA Sage, MD;  Location: [facility] OR;  Service: Orthopedics;  Laterality: Right;  Right 4th Toe Amputation at Metatarsophalangeal   AMPUTATION Left 07/15/2016   Procedure: Left 2nd Ray Amputation;  Surgeon: Sage Jerona LULLA, MD;  Location: [facility] OR;  Service: Orthopedics;  Laterality: Left;   AMPUTATION Left 11/18/2016   Procedure: Left 3rd and 4th Ray Amputation;  Surgeon: Sage Jerona LULLA, MD;  Location: [facility] OR;  Service: Orthopedics;  Laterality: Left;   AMPUTATION Right 03/29/2017   Procedure: RIGHT FOOT 3RD AND 4TH RAY AMPUTATION;  Surgeon: Sage Jerona LULLA, MD;  Location: [facility] OR;  Service: Orthopedics;  Laterality: Right;   AMPUTATION Left 08/12/2020   Procedure: LEFT TRANSMETATARSAL AMPUTATION;  Surgeon: Sage Jerona LULLA, MD;  Location: [facility] OR;  Service: Orthopedics;  Laterality: Left;   AMPUTATION Right 05/31/2023   Procedure: AMPUTATION, FOOT, RAY;  Surgeon: Sage Jerona LULLA, MD;  Location: [facility] OR;  Service: Orthopedics;  Laterality: Right;  RIGHT 3RD METATARSAL RESECTION   APPLICATION OF WOUND VAC Left 08/12/2020   Procedure: APPLICATION OF WOUND VAC;  Surgeon: Sage Jerona LULLA, MD;  Location: [facility]  OR;  Service: Orthopedics;  Laterality: Left;   COLONOSCOPY     LAPAROSCOPIC APPENDECTOMY  01/05/2011   Procedure: APPENDECTOMY LAPAROSCOPIC;  Surgeon: Donnice KATHEE Lunger, MD;  Location: [facility] ORS;  Service: General;  Laterality: N/A;   OOPHORECTOMY  2001   ROTATOR CUFF REPAIR Right    x 2   TUBAL LIGATION     VAGINAL HYSTERECTOMY  2001   Social History   Occupational History   Occupation: Retired   Tobacco Use   Smoking status: Never   Smokeless tobacco: Never  Vaping Use   Vaping status: Never Used  Substance and Sexual Activity   Alcohol use: Never   Drug use: Never   Sexual activity: Yes    Birth control/protection: Surgical

## 2023-09-13 NOTE — Telephone Encounter (Signed)
Pt coming in this morning

## 2023-09-22 ENCOUNTER — Other Ambulatory Visit (HOSPITAL_BASED_OUTPATIENT_CLINIC_OR_DEPARTMENT_OTHER): Payer: Self-pay

## 2023-09-22 ENCOUNTER — Ambulatory Visit (INDEPENDENT_AMBULATORY_CARE_PROVIDER_SITE_OTHER): Admitting: Family

## 2023-09-22 ENCOUNTER — Encounter: Payer: Self-pay | Admitting: Family

## 2023-09-22 DIAGNOSIS — L02611 Cutaneous abscess of right foot: Secondary | ICD-10-CM

## 2023-09-22 DIAGNOSIS — L97511 Non-pressure chronic ulcer of other part of right foot limited to breakdown of skin: Secondary | ICD-10-CM | POA: Diagnosis not present

## 2023-09-22 MED ORDER — AMOXICILLIN-POT CLAVULANATE 500-125 MG PO TABS
1.0000 | ORAL_TABLET | Freq: Three times a day (TID) | ORAL | 0 refills | Status: DC
  Filled 2023-09-22: qty 30, 10d supply, fill #0

## 2023-09-22 NOTE — Progress Notes (Signed)
 Office Visit Note   [name]           Date of Birth: 09-02-1945           MRN: [id number] Visit Date: 09/22/2023              Requested by: Kennyth Worth HERO, MD [address] 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  Plan: Abscess right foot will stop the Bactrim  placed on a course of Augmentin  she is allergic to doxycycline   Provided new felt pressure relieving pads for her shoewear she will minimize her weightbearing she will follow-up in 1 week in the office with Dr. Harden  She would like to ask him if he would consider bony excision over the bony prominence  She will begin Vashe to dry dressing changes at home  Follow-Up Instructions: No follow-ups on file.   Ortho Exam  Patient is alert, oriented, no adenopathy, well-dressed, normal affect, normal respiratory effort. On examination right foot Wagner grade 1 ulcer beneath the medial border of the midfoot with abscess this is debrided with a 10 blade knife back to viable tissue purulent drainage expressed.  Ulcer 3 cm in diameter with 4 mm of depth there is no exposed bone  Vashe to dry dressing change applied    Imaging: No results found. No images are attached to  the encounter.  Labs: Lab Results  Component Value Date   HGBA1C 6.2 (A) 08/09/2023   HGBA1C 6.4 (A) 04/03/2023   HGBA1C 6.7 (H) 09/21/2022   ESRSEDRATE 51 (H) 12/24/2019   ESRSEDRATE 36 (H) 02/24/2017   CRP 13.2 (H) 12/24/2019   CRP 1.2 (H) 02/24/2017   LABURIC 5.5 09/01/2015   REPTSTATUS 06/03/2020 FINAL 05/29/2020   CULT  05/29/2020    NO GROWTH 5 DAYS Performed at [facility], [address] [place], [place] [address]    LABORGA GROUP B STREP (S.AGALACTIAE) ISOLATED 12/26/2014     Lab Results  Component Value Date   ALBUMIN 4.1 11/05/2021   ALBUMIN 4.1 11/05/2021   ALBUMIN 3.8 09/08/2020   PREALBUMIN 19.4 03/05/2012    No results found for: MG No results found for: [facility]  Lab Results  Component Value Date   PREALBUMIN 19.4 03/05/2012      Latest Ref Rng & Units 06/20/2023    8:05 AM 05/31/2023    8:09 AM 12/16/2022    7:59 AM  CBC EXTENDED  WBC 4.0 - 10.5 K/uL 9.4  10.3  10.6   RBC 3.87 - 5.11 MIL/uL 4.06  4.09  4.18   Hemoglobin 12.0 - 15.0 g/dL 87.8  87.8  87.9   HCT 36.0 - 46.0 %  35.7  36.0  37.0   Platelets 150 - 400 K/uL 401  225  417   NEUT# 1.7 - 7.7 K/uL 6.2   6.8   Lymph# 0.7 - 4.0 K/uL 2.2   2.8      There is no height or weight on file to calculate BMI.  Orders:  No orders of the defined types were placed in this encounter.  No orders of the defined types were placed in this encounter.    Procedures: No procedures performed  Clinical Data: No additional findings.  ROS:  All other systems negative, except as noted in the HPI. Review of Systems  Objective: Vital Signs: There were no vitals taken for this visit.  Specialty Comments:  No specialty comments available.  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) 10/23/2019   Nuclear sclerotic cataract of both eyes 10/23/2019   Posterior vitreous detachment of right eye 10/23/2019   Retinal hemorrhage of left eye 10/23/2019   Impingement syndrome of right shoulder 07/06/2017   Iron deficiency anemia 05/11/2017   Morbid obesity (HCC) 04/24/2017   Anemia 02/24/2017   Baker's cyst 07/10/2016   Onychomycosis 12/07/2015   Upper airway cough syndrome 03/05/2014   Hypertension associated with diabetes (HCC) 12/05/2006   T2DM (type 2 diabetes mellitus) (HCC) 10/12/2006   Dyslipidemia associated with type 2 diabetes mellitus (HCC) 10/12/2006   Allergic rhinitis 10/12/2006   GERD - Followed by Dr. Aneita 10/12/2006   IRRITABLE BOWEL SYNDROME - followed by Dr. Aneita 10/12/2006   Peripheral neuropathy 10/11/2006   ALOPECIA NEC 10/11/2006   Past Medical History:  Diagnosis Date   Alopecia    Anemia, mild    Arthritis    Chronic cough    sees pulmonologist   Chronic pain    chest wall and abd - s/p extensive eval   Diabetes mellitus with neuropathy (HCC)    sees endocrine   Diverticulosis    Dyspnea    Fatty liver    GERD (gastroesophageal reflux disease)    takes Nexium  bid, hx erosive esophagitis   Headache(784.0)    occasionally;r/t sinus    History of colon polyps    HTN (hypertension)    Hx of amputation of lesser toe (HCC)    sees podiatrist   Hyperlipemia    IBS (irritable bowel syndrome)    Insomnia    takes Elavil  nightly   Joint pain    Neuropathy    Neuropathy    Osteomyelitis (HCC)    Pneumonia    89/2/22- patient denies   PONV (postoperative nausea and vomiting)    medication did not help with surgery on 03/28/20   Seasonal allergies    takes Zyrtec  daily   Sinus tachycardia     Family History   Problem Relation Age of Onset   Lung cancer Mother 38       smoked heavily   Emphysema Mother    Hypertension Father    Hyperlipidemia Father    Diabetes Father    Coronary artery disease Father    Dementia Father  COPD Father        smoked   Stomach cancer Paternal Aunt    Brain cancer Paternal Uncle    Irritable bowel syndrome Other        Several family members on fathers side    Diabetes Other    Stomach cancer Maternal Aunt    Lung cancer Paternal Uncle    Heart disease Paternal Uncle    Colon cancer Neg Hx     Past Surgical History:  Procedure Laterality Date   AMPUTATION  12/28/2011   Procedure: AMPUTATION DIGIT;  Surgeon: Jerona LULLA Sage, MD;  Location: [facility] OR;  Service: Orthopedics;  Laterality: Right;  Right Foot 2nd Toe Amputation at MTP (metatarsophalangeal joint)   AMPUTATION Right 04/25/2012   Procedure: Right Foot 3rd Toe Amputation;  Surgeon: Jerona LULLA Sage, MD;  Location: [facility] OR;  Service: Orthopedics;  Laterality: Right;  Right Foot Third Toe Amputation    AMPUTATION Right 07/27/2012   Procedure: Right 4th Toe Amputation at Metatarsophalangeal;  Surgeon: Jerona LULLA Sage, MD;  Location: [facility] OR;  Service: Orthopedics;  Laterality: Right;  Right 4th Toe Amputation at Metatarsophalangeal   AMPUTATION Left 07/15/2016   Procedure: Left 2nd Ray Amputation;  Surgeon: Sage Jerona LULLA, MD;  Location: [facility] OR;  Service: Orthopedics;  Laterality: Left;   AMPUTATION Left 11/18/2016   Procedure: Left 3rd and 4th Ray Amputation;  Surgeon: Sage Jerona LULLA, MD;  Location: [facility] OR;  Service: Orthopedics;  Laterality: Left;   AMPUTATION Right 03/29/2017   Procedure: RIGHT FOOT 3RD AND 4TH RAY AMPUTATION;  Surgeon: Sage Jerona LULLA, MD;  Location: [facility] OR;  Service: Orthopedics;  Laterality: Right;   AMPUTATION Left 08/12/2020   Procedure: LEFT TRANSMETATARSAL AMPUTATION;  Surgeon: Sage Jerona LULLA, MD;  Location: [facility] OR;  Service: Orthopedics;  Laterality: Left;   AMPUTATION Right 05/31/2023   Procedure:  AMPUTATION, FOOT, RAY;  Surgeon: Sage Jerona LULLA, MD;  Location: [facility] OR;  Service: Orthopedics;  Laterality: Right;  RIGHT 3RD METATARSAL RESECTION   APPLICATION OF WOUND VAC Left 08/12/2020   Procedure: APPLICATION OF WOUND VAC;  Surgeon: Sage Jerona LULLA, MD;  Location: [facility] OR;  Service: Orthopedics;  Laterality: Left;   COLONOSCOPY     LAPAROSCOPIC APPENDECTOMY  01/05/2011   Procedure: APPENDECTOMY LAPAROSCOPIC;  Surgeon: Donnice KATHEE Lunger, MD;  Location: [facility]S;  Service: General;  Laterality: N/A;   OOPHORECTOMY  2001   ROTATOR CUFF REPAIR Right    x 2   TUBAL LIGATION     VAGINAL HYSTERECTOMY  2001   Social History   Occupational History   Occupation: Retired   Tobacco Use   Smoking status: Never   Smokeless tobacco: Never  Vaping Use   Vaping status: Never Used  Substance and Sexual Activity   Alcohol use: Never   Drug use: Never   Sexual activity: Yes    Birth control/protection: Surgical

## 2023-09-25 ENCOUNTER — Other Ambulatory Visit: Payer: Self-pay | Admitting: Internal Medicine

## 2023-09-25 ENCOUNTER — Other Ambulatory Visit: Payer: Self-pay | Admitting: Family Medicine

## 2023-09-25 ENCOUNTER — Ambulatory Visit: Admitting: Orthopedic Surgery

## 2023-09-28 ENCOUNTER — Ambulatory Visit: Payer: Self-pay

## 2023-09-28 ENCOUNTER — Ambulatory Visit (INDEPENDENT_AMBULATORY_CARE_PROVIDER_SITE_OTHER): Admitting: Orthopedic Surgery

## 2023-09-28 DIAGNOSIS — L97511 Non-pressure chronic ulcer of other part of right foot limited to breakdown of skin: Secondary | ICD-10-CM | POA: Diagnosis not present

## 2023-09-28 NOTE — Telephone Encounter (Signed)
 FYI Only or Action Required?: FYI only for provider.  Patient was last seen in primary care on 09/04/2023 by Kennyth Worth HERO, MD.  Called Nurse Triage reporting Cough and Headache.  Symptoms began several days ago.  Interventions attempted: Rest, hydration, or home remedies.  Symptoms are: gradually improving.  Triage Disposition: Home Care, No Contact Calls  Patient/caregiver understands and will follow disposition?: Yes      Reason for Disposition  Cough  Answer Assessment - Initial Assessment Questions Additional info: Patient called back in, she is inquiring where to get Covid 19 testing from government for free, she has some but they have expired. She reports her symptoms have improved since calling this morning and not needing triage or appointment at this time. Triage-Home care. Will call back for new or worsening symptoms.    1. ONSET: When did the cough begin?      Several days 2. SEVERITY: How bad is the cough today?      improving 3. SPUTUM: Describe the color of your sputum (e.g., none, dry cough; clear, white, yellow, green)     Dry  4. HEMOPTYSIS: Are you coughing up any blood? If Yes, ask: How much? (e.g., flecks, streaks, tablespoons, etc.)     Denies  5. DIFFICULTY BREATHING: Are you having difficulty breathing? If Yes, ask: How bad is it? (e.g., mild, moderate, severe)      Denies  6. FEVER: Do you have a fever? If Yes, ask: What is your temperature, how was it measured, and when did it start?     Denies  7. CARDIAC HISTORY: Do you have any history of heart disease? (e.g., heart attack, congestive heart failure)       8. LUNG HISTORY: Do you have any history of lung disease?  (e.g., pulmonary embolus, asthma, emphysema)      9. PE RISK FACTORS: Do you have a history of blood clots? (or: recent major surgery, recent prolonged travel, bedridden)      10. OTHER SYMPTOMS: Do you have any other symptoms? (e.g., runny nose, wheezing,  chest pain)       Mild headache-improving  Protocols used: Cough - Acute Productive-A-AH

## 2023-09-28 NOTE — Telephone Encounter (Signed)
 Unable to reach patient x 3 attempts. Please advise.    FYI Only or Action Required?: Action required by provider: update on patient condition.  Patient was last seen in primary care on 09/04/2023 by Kennyth Worth HERO, MD.  Called Nurse Triage reporting Cough and Headache.    Triage Disposition: No Contact Calls

## 2023-09-28 NOTE — Telephone Encounter (Signed)
 First attempt; left voice message.    Copied from CRM [id number]. Topic: Clinical - Medical Advice >> Sep 28, 2023 10:10 AM Beth Holden wrote: Reason for CRM: Patient coughing a lot at night, stuffy nose, headache and stomach feels sick - she's going on a trip tomorrow and wants to know where she can pick up a at home covid test

## 2023-10-01 ENCOUNTER — Encounter: Payer: Self-pay | Admitting: Orthopedic Surgery

## 2023-10-01 NOTE — Progress Notes (Signed)
 Office Visit Note   [name]           Date of Birth: 1945/05/23           MRN: [id number] Visit Date: 09/28/2023              Requested by: Kennyth Worth HERO, MD [address] [place],  [place] [address] PCP: Kennyth Worth HERO, MD  Chief Complaint  Patient presents with   Right Foot - Follow-up      HPI: Discussed the use of AI scribe software for clinical note transcription with the patient, who gave verbal consent to proceed.  History of Present Illness Beth Holden is a 78 year old female who presents for follow-up of a foot wound.  She describes the foot wound as blistered but notes significant improvement.  She has been using a 'half moon' pad around the wound to relieve pressure but has run out of these pads. She uses them until they become malodorous and then discards them.  She recently completed a course of antibiotics for the wound. She is concerned about the potential for developing similar sores in the future and is considering using shoe inserts to prevent this. Her current shoes are heavy and pitch her forward, causing discomfort.     Assessment & Plan: Visit Diagnoses:  1. Non-pressure chronic ulcer of other part of right foot limited to breakdown of skin (HCC)     Plan: Assessment and Plan Assessment & Plan Chronic non-pressure foot ulcer Ulcer 2x4 cm, flat, healthy granulation, no infection, drainage, exposed bones, or tendons. Odor from moisture, not infection. Requires additional pressure relief. - Debride to healthy granulation tissue with 10 blade. - Apply silver  nitrate. - Create felt relieving donuts for pressure offloading. - Continue postoperative shoe. - Re-evaluate in two weeks.      Follow-Up Instructions: Return in about 4 weeks (around 10/26/2023).   Ortho Exam  Patient is alert, oriented, no adenopathy, well-dressed, normal affect, normal respiratory effort. Physical Exam SKIN: After informed consent a 10  blade knife was used to debride skin and soft tissue from the first metatarsal wound.  After debridement the wound is 2x4 cm, flat, healthy granulation tissue, no infection, no drainage. No cellulitis, no exposed bones, no exposed tendons.      Imaging: No results found. No images are attached to the encounter.  Labs: Lab Results  Component Value Date   HGBA1C 6.2 (A) 08/09/2023   HGBA1C 6.4 (A) 04/03/2023   HGBA1C 6.7 (H) 09/21/2022   ESRSEDRATE 51 (H) 12/24/2019   ESRSEDRATE 36 (H) 02/24/2017   CRP 13.2 (H) 12/24/2019   CRP 1.2 (H) 02/24/2017   LABURIC 5.5 09/01/2015   REPTSTATUS 06/03/2020 FINAL 05/29/2020   CULT  05/29/2020    NO GROWTH 5 DAYS Performed at [facility], [address] [place], [place] [address]    LABORGA GROUP B STREP (S.AGALACTIAE) ISOLATED 12/26/2014     Lab Results  Component Value Date   ALBUMIN 4.1 11/05/2021   ALBUMIN 4.1 11/05/2021   ALBUMIN 3.8 09/08/2020   PREALBUMIN 19.4 03/05/2012    No results found for: MG No results found for: [facility]  Lab Results  Component Value Date   PREALBUMIN 19.4 03/05/2012      Latest Ref Rng & Units 06/20/2023    8:05 AM 05/31/2023    8:09 AM 12/16/2022    7:59 AM  CBC EXTENDED  WBC 4.0 - 10.5 K/uL 9.4  10.3  10.6   RBC 3.87 - 5.11 MIL/uL 4.06  4.09  4.18   Hemoglobin 12.0 - 15.0 g/dL 87.8  87.8  87.9   HCT 36.0 - 46.0 % 35.7  36.0  37.0   Platelets 150 - 400 K/uL 401  225  417   NEUT# 1.7 - 7.7 K/uL 6.2   6.8   Lymph# 0.7 - 4.0 K/uL 2.2   2.8      There is no height or weight on file to calculate BMI.  Orders:  No orders of the defined types were placed in this encounter.  No orders of the defined types were placed in this encounter.    Procedures: No procedures performed  Clinical Data: No additional findings.  ROS:  All other systems negative, except as noted in the HPI. Review of Systems  Objective: Vital Signs: There were no vitals taken for this  visit.  Specialty Comments:  No specialty comments available.  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) 10/23/2019   Nuclear sclerotic cataract of both eyes 10/23/2019   Posterior vitreous detachment of right eye 10/23/2019   Retinal hemorrhage of left eye 10/23/2019   Impingement syndrome of right shoulder 07/06/2017   Iron deficiency anemia 05/11/2017   Morbid obesity (HCC) 04/24/2017   Anemia 02/24/2017   Baker's cyst 07/10/2016   Onychomycosis 12/07/2015   Upper airway cough syndrome 03/05/2014   Hypertension associated with diabetes (HCC) 12/05/2006   T2DM (type 2 diabetes mellitus) (HCC) 10/12/2006   Dyslipidemia associated with type 2 diabetes mellitus (HCC) 10/12/2006   Allergic rhinitis 10/12/2006   GERD - Followed by Dr. Aneita 10/12/2006   IRRITABLE BOWEL SYNDROME - followed by Dr. Aneita 10/12/2006   Peripheral neuropathy 10/11/2006   ALOPECIA NEC 10/11/2006   Past Medical History:  Diagnosis Date   Alopecia    Anemia, mild    Arthritis    Chronic cough    sees pulmonologist   Chronic pain    chest wall and abd - s/p extensive eval   Diabetes mellitus with neuropathy (HCC)    sees endocrine   Diverticulosis    Dyspnea    Fatty liver    GERD (gastroesophageal reflux disease)    takes Nexium  bid, hx erosive esophagitis   Headache(784.0)    occasionally;r/t sinus    History of colon polyps    HTN (hypertension)    Hx of amputation of lesser toe (HCC)    sees podiatrist   Hyperlipemia    IBS (irritable bowel syndrome)    Insomnia     takes Elavil  nightly   Joint pain    Neuropathy    Neuropathy    Osteomyelitis (HCC)    Pneumonia    89/2/22- patient denies   PONV (postoperative nausea and vomiting)    medication did not help with surgery on 03/28/20   Seasonal allergies    takes Zyrtec  daily   Sinus tachycardia     Family History  Problem Relation Age of Onset   Lung cancer Mother 53       smoked  heavily   Emphysema Mother    Hypertension Father    Hyperlipidemia Father    Diabetes Father    Coronary artery disease Father    Dementia Father    COPD Father        smoked   Stomach cancer Paternal Aunt    Brain cancer Paternal Uncle    Irritable bowel syndrome Other        Several family members on fathers side    Diabetes Other    Stomach cancer Maternal Aunt    Lung cancer Paternal Uncle    Heart disease Paternal Uncle    Colon cancer Neg Hx     Past Surgical History:  Procedure Laterality Date   AMPUTATION  12/28/2011   Procedure: AMPUTATION DIGIT;  Surgeon: Jerona LULLA Sage, MD;  Location: [facility] OR;  Service: Orthopedics;  Laterality: Right;  Right Foot 2nd Toe Amputation at MTP (metatarsophalangeal joint)   AMPUTATION Right 04/25/2012   Procedure: Right Foot 3rd Toe Amputation;  Surgeon: Jerona LULLA Sage, MD;  Location: [facility] OR;  Service: Orthopedics;  Laterality: Right;  Right Foot Third Toe Amputation    AMPUTATION Right 07/27/2012   Procedure: Right 4th Toe Amputation at Metatarsophalangeal;  Surgeon: Jerona LULLA Sage, MD;  Location: [facility] OR;  Service: Orthopedics;  Laterality: Right;  Right 4th Toe Amputation at Metatarsophalangeal   AMPUTATION Left 07/15/2016   Procedure: Left 2nd Ray Amputation;  Surgeon: Sage Jerona LULLA, MD;  Location: [facility] OR;  Service: Orthopedics;  Laterality: Left;   AMPUTATION Left 11/18/2016   Procedure: Left 3rd and 4th Ray Amputation;  Surgeon: Sage Jerona LULLA, MD;  Location: [facility] OR;  Service: Orthopedics;  Laterality: Left;   AMPUTATION Right 03/29/2017   Procedure: RIGHT FOOT 3RD AND 4TH  RAY AMPUTATION;  Surgeon: Sage Jerona LULLA, MD;  Location: [facility] OR;  Service: Orthopedics;  Laterality: Right;   AMPUTATION Left 08/12/2020   Procedure: LEFT TRANSMETATARSAL AMPUTATION;  Surgeon: Sage Jerona LULLA, MD;  Location: [facility] OR;  Service: Orthopedics;  Laterality: Left;   AMPUTATION Right 05/31/2023   Procedure: AMPUTATION, FOOT, RAY;  Surgeon: Sage Jerona LULLA, MD;  Location: [facility] OR;  Service: Orthopedics;  Laterality: Right;  RIGHT 3RD METATARSAL RESECTION   APPLICATION OF WOUND VAC Left 08/12/2020   Procedure: APPLICATION OF WOUND VAC;  Surgeon: Sage Jerona LULLA, MD;  Location: [facility] OR;  Service: Orthopedics;  Laterality: Left;   COLONOSCOPY     LAPAROSCOPIC APPENDECTOMY  01/05/2011   Procedure: APPENDECTOMY LAPAROSCOPIC;  Surgeon: Donnice KATHEE Lunger, MD;  Location: [facility] ORS;  Service: General;  Laterality: N/A;   OOPHORECTOMY  2001   ROTATOR CUFF REPAIR Right    x 2   TUBAL LIGATION     VAGINAL HYSTERECTOMY  2001   Social History   Occupational History   Occupation: Retired   Tobacco Use   Smoking status: Never   Smokeless tobacco: Never  Vaping Use   Vaping status: Never Used  Substance and Sexual Activity   Alcohol use: Never   Drug use: Never   Sexual activity: Yes    Birth control/protection: Surgical

## 2023-10-03 NOTE — Telephone Encounter (Signed)
 Noted

## 2023-10-11 DIAGNOSIS — R519 Headache, unspecified: Secondary | ICD-10-CM | POA: Diagnosis not present

## 2023-10-11 DIAGNOSIS — Z961 Presence of intraocular lens: Secondary | ICD-10-CM | POA: Diagnosis not present

## 2023-10-11 DIAGNOSIS — H0102B Squamous blepharitis left eye, upper and lower eyelids: Secondary | ICD-10-CM | POA: Diagnosis not present

## 2023-10-11 DIAGNOSIS — H0102A Squamous blepharitis right eye, upper and lower eyelids: Secondary | ICD-10-CM | POA: Diagnosis not present

## 2023-10-11 DIAGNOSIS — E113591 Type 2 diabetes mellitus with proliferative diabetic retinopathy without macular edema, right eye: Secondary | ICD-10-CM | POA: Diagnosis not present

## 2023-10-11 DIAGNOSIS — H43811 Vitreous degeneration, right eye: Secondary | ICD-10-CM | POA: Diagnosis not present

## 2023-10-11 LAB — OPHTHALMOLOGY REPORT-SCANNED

## 2023-10-12 ENCOUNTER — Ambulatory Visit: Admitting: Orthopedic Surgery

## 2023-10-12 ENCOUNTER — Encounter: Payer: Self-pay | Admitting: Orthopedic Surgery

## 2023-10-12 DIAGNOSIS — L97511 Non-pressure chronic ulcer of other part of right foot limited to breakdown of skin: Secondary | ICD-10-CM

## 2023-10-12 NOTE — Progress Notes (Signed)
 Office Visit Note   [name]           Date of Birth: February 13, 1945           MRN: [id number] Visit Date: 10/12/2023              Requested by: Kennyth Worth HERO, MD [address] [place],  [place] [address] PCP: Kennyth Worth HERO, MD  Chief Complaint  Patient presents with   Right Foot - Follow-up      HPI: Discussed the use of AI scribe software for clinical note transcription with the patient, who gave verbal consent to proceed.  History of Present Illness Beth Holden is a 78 year old female with Charcot foot who presents for follow-up of her foot ulcer.  She has been experiencing minimal leakage from her foot ulcer over the past week. After trimming the dead skin, the area appears improved. She experiences deep pain in the foot, particularly in one spot, but does not believe it is related to her recent operation. She has been wearing special shoes and a half donut insert, which she feels has helped alleviate pressure on her foot.  She is currently taking Neurontin , 100 mg per pill, and has been taking one to two pills daily. She is considering increasing the dose to three pills per day to manage her neuropathic pain, but has not done so yet due to concerns about potential side effects.  She is planning a trip to [facility] in December and is concerned about the amount of walking required. She has a lightweight wheelchair that she plans to use if necessary to avoid exacerbating her foot condition. She is also considering getting new shoes and inquiring about the possibility of using her current inserts with them.     Assessment & Plan: Visit Diagnoses:  1. Non-pressure chronic ulcer of other part of right foot limited to breakdown of skin (HCC)     Plan: Assessment and Plan Assessment & Plan Left foot Charcot deformity with Wagner grade 1 ulcer Charcot deformity with Wagner grade 1 ulcer on left foot, 4x3 cm post-debridement, treated with silver   nitrate, showing improvement without plantar ulcers, redness, or cellulitis. - Continue felt relieving donuts to unload pressure. - Re-evaluate in four weeks.  Neuropathic pain due to diabetic neuropathy Increased neuropathic pain likely from diabetic neuropathy. Discussed increasing Neurontin  to 300 mg/day with potential side effects, noting it is not a high dose. - Increase Neurontin  to 300 mg per day. - Consider weaning off Neurontin  once neuropathy resolves.      Follow-Up Instructions: Return in about 4 weeks (around 11/09/2023).   Ortho Exam  Patient is alert, oriented, no adenopathy, well-dressed, normal affect, normal respiratory effort. Physical Exam EXTREMITIES: Left foot without plantar ulcers, redness, or cellulitis. Right foot without ulcers, redness, cellulitis, or swelling. Charcot foot with Wagner grade 1 ulcer measuring 4x3 cm after debridement.  After informed consent a 10 blade knife was used to debride the skin and soft tissue back to healthy viable granulation tissue.  This was touched with silver  nitrate.  After debridement the ulcer is 4 x 3 cm.  There is no tunneling the wound is flat.      Imaging: No results found. No images are attached to the encounter.  Labs: Lab Results  Component Value Date   HGBA1C 6.2 (A) 08/09/2023   HGBA1C 6.4 (A) 04/03/2023   HGBA1C 6.7 (H) 09/21/2022   ESRSEDRATE 51 (H) 12/24/2019  ESRSEDRATE 36 (H) 02/24/2017   CRP 13.2 (H) 12/24/2019   CRP 1.2 (H) 02/24/2017   LABURIC 5.5 09/01/2015   REPTSTATUS 06/03/2020 FINAL 05/29/2020   CULT  05/29/2020    NO GROWTH 5 DAYS Performed at [facility], [address], [place], [place] [address]    LABORGA GROUP B STREP (S.AGALACTIAE) ISOLATED 12/26/2014     Lab Results  Component Value Date   ALBUMIN 4.1 11/05/2021   ALBUMIN 4.1 11/05/2021   ALBUMIN 3.8 09/08/2020   PREALBUMIN 19.4 03/05/2012    No results found for: MG No results found for: [facility]  [facility]  Results  Component Value Date   PREALBUMIN 19.4 03/05/2012      Latest Ref Rng & Units 06/20/2023    8:05 AM 05/31/2023    8:09 AM 12/16/2022    7:59 AM  CBC EXTENDED  WBC 4.0 - 10.5 K/uL 9.4  10.3  10.6   RBC 3.87 - 5.11 MIL/uL 4.06  4.09  4.18   Hemoglobin 12.0 - 15.0 g/dL 87.8  87.8  87.9   HCT 36.0 - 46.0 % 35.7  36.0  37.0   Platelets 150 - 400 K/uL 401  225  417   NEUT# 1.7 - 7.7 K/uL 6.2   6.8   Lymph# 0.7 - 4.0 K/uL 2.2   2.8      There is no height or weight on file to calculate BMI.  Orders:  No orders of the defined types were placed in this encounter.  No orders of the defined types were placed in this encounter.    Procedures: No procedures performed  Clinical Data: No additional findings.  ROS:  All other systems negative, except as noted in the HPI. Review of Systems  Objective: Vital Signs: There were no vitals taken for this visit.  Specialty Comments:  No specialty comments available.  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) 10/23/2019   Nuclear sclerotic cataract of both eyes 10/23/2019   Posterior vitreous detachment of right eye 10/23/2019   Retinal hemorrhage of left eye 10/23/2019   Impingement syndrome of right shoulder 07/06/2017   Iron deficiency anemia 05/11/2017   Morbid obesity (HCC) 04/24/2017   Anemia 02/24/2017   Baker's cyst 07/10/2016   Onychomycosis 12/07/2015   Upper airway cough syndrome 03/05/2014   Hypertension associated with diabetes (HCC) 12/05/2006   T2DM (type 2 diabetes mellitus) (HCC)  10/12/2006   Dyslipidemia associated with type 2 diabetes mellitus (HCC) 10/12/2006   Allergic rhinitis 10/12/2006   GERD - Followed by Dr. Aneita 10/12/2006   IRRITABLE BOWEL SYNDROME - followed by Dr. Aneita 10/12/2006   Peripheral neuropathy 10/11/2006   ALOPECIA NEC 10/11/2006   Past Medical History:  Diagnosis Date   Alopecia    Anemia, mild    Arthritis    Chronic cough    sees pulmonologist   Chronic pain    chest wall and abd - s/p extensive eval   Diabetes mellitus with neuropathy [facility])    sees endocrine   Diverticulosis    Dyspnea  Fatty liver    GERD (gastroesophageal reflux disease)    takes Nexium  bid, hx erosive esophagitis   Headache(784.0)    occasionally;r/t sinus    History of colon polyps    HTN (hypertension)    Hx of amputation of lesser toe    sees podiatrist   Hyperlipemia    IBS (irritable bowel syndrome)    Insomnia    takes Elavil  nightly   Joint pain    Neuropathy    Neuropathy    Osteomyelitis (HCC)    Pneumonia    89/2/22- patient denies   PONV (postoperative nausea and vomiting)    medication did not help with surgery on 03/28/20   Seasonal allergies    takes Zyrtec  daily   Sinus tachycardia     Family History  Problem Relation Age of Onset   Lung cancer Mother 18       smoked heavily   Emphysema Mother    Hypertension Father    Hyperlipidemia Father    Diabetes Father    Coronary artery disease Father    Dementia Father    COPD Father        smoked   Stomach cancer Paternal Aunt    Brain cancer Paternal Uncle    Irritable bowel syndrome Other        Several family members on fathers side    Diabetes Other    Stomach cancer Maternal Aunt    Lung cancer Paternal Uncle    Heart disease Paternal Uncle    Colon cancer Neg Hx     Past Surgical History:  Procedure Laterality Date   AMPUTATION  12/28/2011   Procedure: AMPUTATION DIGIT;  Surgeon: Jerona LULLA Sage, MD;  Location: [facility] OR;  Service: Orthopedics;  Laterality: Right;   Right Foot 2nd Toe Amputation at MTP (metatarsophalangeal joint)   AMPUTATION Right 04/25/2012   Procedure: Right Foot 3rd Toe Amputation;  Surgeon: Jerona LULLA Sage, MD;  Location: [facility] OR;  Service: Orthopedics;  Laterality: Right;  Right Foot Third Toe Amputation    AMPUTATION Right 07/27/2012   Procedure: Right 4th Toe Amputation at Metatarsophalangeal;  Surgeon: Jerona LULLA Sage, MD;  Location: [facility] OR;  Service: Orthopedics;  Laterality: Right;  Right 4th Toe Amputation at Metatarsophalangeal   AMPUTATION Left 07/15/2016   Procedure: Left 2nd Ray Amputation;  Surgeon: Sage Jerona LULLA, MD;  Location:  [facility] OR;  Service: Orthopedics;  Laterality: Left;   AMPUTATION Left 11/18/2016   Procedure: Left 3rd and 4th Ray Amputation;  Surgeon: Sage Jerona LULLA, MD;  Location: [facility] OR;  Service: Orthopedics;  Laterality: Left;   AMPUTATION Right 03/29/2017   Procedure: RIGHT FOOT 3RD AND 4TH RAY AMPUTATION;  Surgeon: Sage Jerona LULLA, MD;  Location: [facility] OR;  Service: Orthopedics;  Laterality: Right;   AMPUTATION Left 08/12/2020   Procedure: LEFT TRANSMETATARSAL AMPUTATION;  Surgeon: Sage Jerona LULLA, MD;  Location: [facility] OR;  Service: Orthopedics;  Laterality: Left;   AMPUTATION Right 05/31/2023   Procedure: AMPUTATION, FOOT, RAY;  Surgeon: Sage Jerona LULLA, MD;  Location: [facility] OR;  Service: Orthopedics;  Laterality: Right;  RIGHT 3RD METATARSAL RESECTION   APPLICATION OF WOUND VAC Left 08/12/2020   Procedure: APPLICATION OF WOUND VAC;  Surgeon: Sage Jerona LULLA, MD;  Location: [facility] OR;  Service: Orthopedics;  Laterality: Left;   COLONOSCOPY     LAPAROSCOPIC APPENDECTOMY  01/05/2011   Procedure: APPENDECTOMY LAPAROSCOPIC;  Surgeon: Donnice KATHEE Lunger, MD;  Location: [facility] ORS;  Service: General;  Laterality: N/A;  OOPHORECTOMY  2001   ROTATOR CUFF REPAIR Right    x 2   TUBAL LIGATION     VAGINAL HYSTERECTOMY  2001   Social History   Occupational History   Occupation: Retired   Tobacco Use   Smoking status: Never   Smokeless tobacco:  Never  Vaping Use   Vaping status: Never Used  Substance and Sexual Activity   Alcohol use: Never   Drug use: Never   Sexual activity: Yes    Birth control/protection: Surgical

## 2023-10-24 NOTE — Progress Notes (Unsigned)
 Cardiology Office Note   Date:  10/26/2023   ID:  Beth, Holden 07-24-1945, MRN [id number]  PCP:  Kennyth Worth HERO, MD  Cardiologist:   Vina Gull, MD   Pt returns for f/u of HTN and HL   History of Present Illness: Beth Holden is a 78 y.o. female with a history of Diabetes GERD, HTN, HL   I saw her Dec 2017 for the first time for CP   Pain was substernal  Breathing helped   Not associated with activity   Myovue was done which was normal    The patient also complained of some palpitations  Holter monitor showed no signif arrhythm     I recomm a trial of a low dose b blocke With cough I recomm she stop there ACE   Since she was last seen in clinc she says she had a toe amputation in Spring 2025 due to osteo.  Follows with HERO Sage   Has another wound in foot that he is following   She denies SOB   Says she gets occasional CP at night when sitting  Not with activity  Grieving over loss of husband who died in 03/04/23 Outpatient Medications Prior to Visit  Medication Sig Dispense Refill   acetaminophen  (TYLENOL ) 500 MG tablet Take 500 mg by mouth every 8 (eight) hours as needed for mild pain (pain score 1-3) or moderate pain (pain score 4-6).     azelastine  (ASTELIN ) 0.1 % nasal spray Place 2 sprays into both nostrils 2 (two) times daily. (Patient taking differently: Place 2 sprays into both nostrils 2 (two) times daily as needed for allergies.) 30 mL 12   betamethasone  dipropionate (DIPROLENE ) 0.05 % cream Apply 1 application topically 2 (two) times daily as needed (IRRITATION). 30 g 0   Blood Glucose Monitoring Suppl (ONE TOUCH ULTRA 2) w/Device KIT Use to check blood sugar 3-4 times a day DxCode:E11.9 1 kit 0   Calcium  Carb-Cholecalciferol  (CALCIUM  + VITAMIN D3) 600-10 MG-MCG TABS Take 2 tablets by mouth daily. 90 tablet 3   Cholecalciferol  (VITAMIN D3) 250 MCG (10000 UT) capsule Take 10,000 Units by mouth daily.     clotrimazole  (CLOTRIMAZOLE  AF) 1 % cream Apply 1  Application topically 2 (two) times daily. (Patient taking differently: Apply 1 Application topically 2 (two) times daily as needed (Rash).) 35.4 g 2   ferrous sulfate 325 (65 FE) MG tablet Take 325 mg by mouth daily with breakfast.     fexofenadine  (ALLEGRA  ALLERGY) 180 MG tablet Take 1 tablet (180 mg total) by mouth daily. 100 tablet 3   Fluocinolone  Acetonide 0.01 % OIL Place 1-2 drops into both ears daily. As needed (Patient taking differently: Place 1-2 drops into both ears 2 (two) times a week.) 20 mL 0   gabapentin  (NEURONTIN ) 100 MG capsule TAKE 2 TO 3 CAPSULES AT    BEDTIME 180 capsule 5   glucagon  (GLUCAGEN) 1 MG SOLR injection Inject 1 mg into the muscle once as needed for up to 1 dose for low blood sugar. 1 each 11   glucose blood (ONETOUCH ULTRA) test strip Use to check blood sugar 3-4 times a day DxCode:E11.9 400 each 5   Insulin  Pen Needle 32G X 4 MM MISC Use 1x a day 100 each 3   insulin  regular (NOVOLIN R RELION) 100 units/mL injection Inject 0.2-0.3 mLs (20-30 Units total) into the skin 3 (three) times daily before meals. Per sliding scale (Patient taking differently: Inject  10-15 Units into the skin 2 (two) times daily before a meal. Per sliding scale)     Insulin  Syringe-Needle U-100 (RELION INSULIN  SYRINGE 1ML/31G) 31G X 5/16 1 ML MISC USE 2 TIMES A DAY 100 each 2   irbesartan -hydrochlorothiazide  (AVALIDE) 150-12.5 MG tablet TAKE 1 TABLET DAILY 90 tablet 3   Lancets (ONETOUCH ULTRASOFT) lancets Use to check blood sugar 3-4 times a day DxCode:E11.9 100 each 12   metFORMIN  (GLUCOPHAGE ) 1000 MG tablet TAKE 1 TABLET TWICE DAILY  WITH MEALS 180 tablet 3   metoprolol  succinate (TOPROL -XL) 25 MG 24 hr tablet TAKE 1 TABLET DAILY 90 tablet 0   omeprazole  (PRILOSEC) 40 MG capsule TAKE 1 CAPSULE TWICE DAILY 180 capsule 3   OVER THE COUNTER MEDICATION Apply 1-3 application  topically at bedtime as needed (foot pain). TOPRICIN FOOT CREAM - NEUROPATHY FOOT CREAM **APPLIES TO BOTH FEET AT  BEDTIME**     Probiotic Product (PROBIOTIC DAILY PO) Take 1 capsule by mouth daily.     rosuvastatin  (CRESTOR ) 20 MG tablet TAKE 1 TABLET DAILY 90 tablet 0   silver  sulfADIAZINE  (SILVADENE ) 1 % cream Apply 1 Application topically daily. (Patient taking differently: Apply 1 Application topically daily as needed (Wound care).) 50 g 0   tirzepatide  (MOUNJARO ) 7.5 MG/0.5ML Pen Inject 7.5 mg into the skin once a week. 6 mL 3   vitamin B-12 (CYANOCOBALAMIN ) 1000 MCG tablet Take 1,000 mcg by mouth daily.     vitamin C (ASCORBIC ACID ) 500 MG tablet Take 500 mg by mouth in the morning.     amoxicillin -clavulanate (AUGMENTIN ) 500-125 MG tablet Take 1 tablet by mouth 3 (three) times daily. 30 tablet 0   sulfamethoxazole -trimethoprim  (BACTRIM  DS) 800-160 MG tablet Take 1 tablet by mouth 2 (two) times daily. 20 tablet 0   traZODone  (DESYREL ) 50 MG tablet Take 0.5-1 tablets (25-50 mg total) by mouth at bedtime. 30 tablet 5   No facility-administered medications prior to visit.     Allergies:   Doxycycline  calcium , Codeine , and Propoxyphene hcl   Past Medical History:  Diagnosis Date   Alopecia    Anemia, mild    Arthritis    Chronic cough    sees pulmonologist   Chronic pain    chest wall and abd - s/p extensive eval   Diabetes mellitus with neuropathy (HCC)    sees endocrine   Diverticulosis    Dyspnea    Fatty liver    GERD (gastroesophageal reflux disease)    takes Nexium  bid, hx erosive esophagitis   Headache(784.0)    occasionally;r/t sinus    History of colon polyps    HTN (hypertension)    Hx of amputation of lesser toe    sees podiatrist   Hyperlipemia    IBS (irritable bowel syndrome)    Insomnia    takes Elavil  nightly   Joint pain    Neuropathy    Neuropathy    Osteomyelitis (HCC)    Pneumonia    89/2/22- patient denies   PONV (postoperative nausea and vomiting)    medication did not help with surgery on 03/28/20   Seasonal allergies    takes Zyrtec  daily   Sinus  tachycardia     Past Surgical History:  Procedure Laterality Date   AMPUTATION  12/28/2011   Procedure: AMPUTATION DIGIT;  Surgeon: Jerona LULLA Sage, MD;  Location: [facility];  Service: Orthopedics;  Laterality: Right;  Right Foot 2nd Toe Amputation at MTP (metatarsophalangeal joint)   AMPUTATION Right 04/25/2012  Procedure: Right Foot 3rd Toe Amputation;  Surgeon: Jerona LULLA Sage, MD;  Location: [facility] OR;  Service: Orthopedics;  Laterality: Right;  Right Foot Third Toe Amputation    AMPUTATION Right 07/27/2012   Procedure: Right 4th Toe Amputation at Metatarsophalangeal;  Surgeon: Jerona LULLA Sage, MD;  Location: [facility] OR;  Service: Orthopedics;  Laterality: Right;  Right 4th Toe Amputation at Metatarsophalangeal   AMPUTATION Left 07/15/2016   Procedure: Left 2nd Ray Amputation;  Surgeon: Sage Jerona LULLA, MD;  Location: [facility] OR;  Service: Orthopedics;  Laterality: Left;   AMPUTATION Left 11/18/2016   Procedure: Left 3rd and 4th Ray Amputation;  Surgeon: Sage Jerona LULLA, MD;  Location: [facility] OR;  Service: Orthopedics;  Laterality: Left;   AMPUTATION Right 03/29/2017   Procedure: RIGHT FOOT 3RD AND 4TH RAY AMPUTATION;  Surgeon: Sage Jerona LULLA, MD;  Location: [facility] OR;  Service: Orthopedics;  Laterality: Right;   AMPUTATION Left 08/12/2020   Procedure: LEFT TRANSMETATARSAL AMPUTATION;  Surgeon: Sage Jerona LULLA, MD;  Location: [facility] OR;  Service: Orthopedics;  Laterality: Left;   AMPUTATION Right 05/31/2023   Procedure: AMPUTATION, FOOT, RAY;  Surgeon: Sage Jerona LULLA, MD;  Location: [facility] OR;  Service: Orthopedics;  Laterality: Right;  RIGHT 3RD METATARSAL RESECTION   APPLICATION OF WOUND VAC Left 08/12/2020   Procedure: APPLICATION OF WOUND VAC;  Surgeon: Sage Jerona LULLA, MD;  Location: [facility] OR;  Service: Orthopedics;  Laterality: Left;   COLONOSCOPY     LAPAROSCOPIC APPENDECTOMY  01/05/2011   Procedure: APPENDECTOMY LAPAROSCOPIC;  Surgeon: Donnice KATHEE Lunger, MD;  Location: [facility] ORS;  Service: General;  Laterality: N/A;   OOPHORECTOMY  2001    ROTATOR CUFF REPAIR Right    x 2   TUBAL LIGATION     VAGINAL HYSTERECTOMY  2001     Social History:  The patient  reports that she has never smoked. She has never used smokeless tobacco. She reports that she does not drink alcohol and does not use drugs.   Family History:  The patient's family history includes Brain cancer in her paternal uncle; COPD in her father; Coronary artery disease in her father; Dementia in her father; Diabetes in her father and another family member; Emphysema in her mother; Heart disease in her paternal uncle; Hyperlipidemia in her father; Hypertension in her father; Irritable bowel syndrome in an other family member; Lung cancer in her paternal uncle; Lung cancer (age of onset: 64) in her mother; Stomach cancer in her maternal aunt and paternal aunt.    ROS:  Please see the history of present illness. All other systems are reviewed and  Negative to the above problem except as noted.    PHYSICAL EXAM: VS:  BP 124/72 (BP Location: Left Arm, Patient Position: Sitting)   Pulse 61   Ht 5' 5 (1.651 m)   Wt 189 lb (85.7 kg)   SpO2 98%   BMI 31.45 kg/m   GEN:  Obese 78 yo  in no acute distress  HEENT: normal  Neck: no JVD, no bruits Cardiac: RRR; no murmurs  Respiratory:  clear to auscultation GI: soft, nontender, No hepatomegaly  Ext   No LE edema   EKG:  EKG shows SR 65 bpm    Lipid Panel    Component Value Date/Time   CHOL 122 11/05/2021 0913   TRIG 159.0 (H) 11/05/2021 0913   HDL 39.90 11/05/2021 0913   CHOLHDL 3 11/05/2021 0913   VLDL 31.8 11/05/2021 0913   LDLCALC 50 11/05/2021 0913  Wt Readings from Last 3 Encounters:  10/26/23 189 lb (85.7 kg)  09/04/23 187 lb 6.4 oz (85 kg)  08/09/23 186 lb 3.2 oz (84.5 kg)      ASSESSMENT AND PLAN:  1  Hx of dyspnea  Overall breathing is OK  Follow   2  CP   Pt does have atherosclerosis   Current complaints of CP are atypical   Follow   3  Hx palpitations  Pt denies     4  HTN  BP is  well controlled   5  HL   Will check NMR panel and Lpa     6  DM A1C 6.2  REviewed diet      Current medicines are reviewed at length with the patient today.  The patient does not have concerns regarding medicines.  Signed, Vina Gull, MD  10/26/2023 9:40 AM    [facility] [address] [place], [place], [place]  [address] Phone: [phone]; Fax: [phone]

## 2023-10-25 ENCOUNTER — Other Ambulatory Visit: Payer: Self-pay | Admitting: Internal Medicine

## 2023-10-26 ENCOUNTER — Encounter: Payer: Self-pay | Admitting: Internal Medicine

## 2023-10-26 ENCOUNTER — Other Ambulatory Visit: Payer: Self-pay

## 2023-10-26 ENCOUNTER — Ambulatory Visit: Attending: Cardiology | Admitting: Internal Medicine

## 2023-10-26 VITALS — BP 124/72 | HR 61 | Ht 65.0 in | Wt 189.0 lb

## 2023-10-26 DIAGNOSIS — I10 Essential (primary) hypertension: Secondary | ICD-10-CM | POA: Diagnosis present

## 2023-10-26 DIAGNOSIS — E785 Hyperlipidemia, unspecified: Secondary | ICD-10-CM | POA: Diagnosis present

## 2023-10-26 DIAGNOSIS — Z79899 Other long term (current) drug therapy: Secondary | ICD-10-CM | POA: Insufficient documentation

## 2023-10-26 DIAGNOSIS — E1169 Type 2 diabetes mellitus with other specified complication: Secondary | ICD-10-CM | POA: Diagnosis present

## 2023-10-26 MED ORDER — GABAPENTIN 100 MG PO CAPS
ORAL_CAPSULE | ORAL | 0 refills | Status: DC
Start: 2023-10-26 — End: 2023-11-30

## 2023-10-26 NOTE — Patient Instructions (Signed)
 Medication Instructions:  Your physician recommends that you continue on your current medications as directed. Please refer to the Current Medication list given to you today.  *If you need a refill on your cardiac medications before your next appointment, please call your pharmacy*  Lab Work: TODAY: NMR, TSH, LP(a) If you have labs (blood work) drawn today and your tests are completely normal, you will receive your results only by: MyChart Message (if you have MyChart) OR A paper copy in the mail If you have any lab test that is abnormal or we need to change your treatment, we will call you to review the results.  Follow-Up: At [facility], you and your health needs are our priority.  As part of our continuing mission to provide you with exceptional heart care, our providers are all part of one team.  This team includes your primary Cardiologist (physician) and Advanced Practice Providers or APPs (Physician Assistants and Nurse Practitioners) who all work together to provide you with the care you need, when you need it.  Your next appointment:   1 year(s)  Provider:   Vina Gull, MD   We recommend signing up for the patient portal called MyChart.  Sign up information is provided on this After Visit Summary.  MyChart is used to connect with patients for Virtual Visits (Telemedicine).  Patients are able to view lab/test results, encounter notes, upcoming appointments, etc.  Non-urgent messages can be sent to your provider as well.    To learn more about what you can do with MyChart, go to [email or web address].

## 2023-10-27 LAB — NMR, LIPOPROFILE
Cholesterol, Total: 135 mg/dL (ref 100–199)
HDL Particle Number: 35.9 umol/L (ref >=30.5)
HDL-C: 50 mg/dL (ref >39)
LDL Particle Number: 811 nmol/L (ref <1000)
LDL Size: 20.5 nm — ABNORMAL LOW (ref >20.5)
LDL-C (NIH Calc): 64 mg/dL (ref 0–99)
LP-IR Score: 54 — ABNORMAL HIGH (ref <=45)
Small LDL Particle Number: 443 nmol/L (ref <=527)
Triglycerides: 116 mg/dL (ref 0–149)

## 2023-10-27 LAB — LIPOPROTEIN A (LPA): Lipoprotein (a): 24.8 nmol/L (ref <75.0)

## 2023-10-27 LAB — TSH: TSH: 1.99 u[IU]/mL (ref 0.450–4.500)

## 2023-10-30 ENCOUNTER — Ambulatory Visit: Payer: Self-pay | Admitting: Internal Medicine

## 2023-10-30 NOTE — Progress Notes (Signed)
 Order(s) created erroneously. Erroneous order ID: [id number]  Order moved by: CHART CORRECTION ANALSYST SIXTEEN, IDENTITY  Order move date/time: 10/30/2023 1:55 PM  Source Patient: [id number]  Source Contact: 10/26/2023  Destination Patient: [id number]  Destination Contact: 06/22/2022

## 2023-11-11 ENCOUNTER — Other Ambulatory Visit: Payer: Self-pay | Admitting: Family Medicine

## 2023-11-11 DIAGNOSIS — J309 Allergic rhinitis, unspecified: Secondary | ICD-10-CM

## 2023-11-13 ENCOUNTER — Encounter: Payer: Self-pay | Admitting: Radiology

## 2023-11-16 ENCOUNTER — Ambulatory Visit (INDEPENDENT_AMBULATORY_CARE_PROVIDER_SITE_OTHER): Admitting: Orthopedic Surgery

## 2023-11-16 ENCOUNTER — Encounter: Payer: Self-pay | Admitting: Orthopedic Surgery

## 2023-11-16 ENCOUNTER — Ambulatory Visit: Payer: Medicare Other | Admitting: Family Medicine

## 2023-11-16 VITALS — BP 102/62 | HR 84 | Temp 97.4°F | Ht 65.0 in | Wt 183.4 lb

## 2023-11-16 DIAGNOSIS — E1122 Type 2 diabetes mellitus with diabetic chronic kidney disease: Secondary | ICD-10-CM

## 2023-11-16 DIAGNOSIS — E1159 Type 2 diabetes mellitus with other circulatory complications: Secondary | ICD-10-CM

## 2023-11-16 DIAGNOSIS — G4700 Insomnia, unspecified: Secondary | ICD-10-CM | POA: Diagnosis not present

## 2023-11-16 DIAGNOSIS — L97511 Non-pressure chronic ulcer of other part of right foot limited to breakdown of skin: Secondary | ICD-10-CM

## 2023-11-16 DIAGNOSIS — I152 Hypertension secondary to endocrine disorders: Secondary | ICD-10-CM

## 2023-11-16 DIAGNOSIS — Z89432 Acquired absence of left foot: Secondary | ICD-10-CM

## 2023-11-16 DIAGNOSIS — M14679 Charcot's joint, unspecified ankle and foot: Secondary | ICD-10-CM

## 2023-11-16 DIAGNOSIS — M8580 Other specified disorders of bone density and structure, unspecified site: Secondary | ICD-10-CM | POA: Diagnosis not present

## 2023-11-16 DIAGNOSIS — E1169 Type 2 diabetes mellitus with other specified complication: Secondary | ICD-10-CM

## 2023-11-16 DIAGNOSIS — E2839 Other primary ovarian failure: Secondary | ICD-10-CM

## 2023-11-16 DIAGNOSIS — N183 Chronic kidney disease, stage 3 unspecified: Secondary | ICD-10-CM

## 2023-11-16 DIAGNOSIS — Z0000 Encounter for general adult medical examination without abnormal findings: Secondary | ICD-10-CM

## 2023-11-16 DIAGNOSIS — E785 Hyperlipidemia, unspecified: Secondary | ICD-10-CM | POA: Diagnosis not present

## 2023-11-16 DIAGNOSIS — D509 Iron deficiency anemia, unspecified: Secondary | ICD-10-CM

## 2023-11-16 DIAGNOSIS — Z7985 Long-term (current) use of injectable non-insulin antidiabetic drugs: Secondary | ICD-10-CM | POA: Diagnosis not present

## 2023-11-16 NOTE — Assessment & Plan Note (Signed)
 Follows with endocrinology.  A1c's have been well-controlled.  She is on Mounjaro  7.5 mg weekly.

## 2023-11-16 NOTE — Assessment & Plan Note (Signed)
 Will order repeat bone density scan.

## 2023-11-16 NOTE — Assessment & Plan Note (Signed)
 Her overall mood has improved the last several weeks though she is still going through the grief process due to her husband passing away several months ago.  She does have trazodone  to use as needed though has not had to use this.  She has good support at home.  She will let us  know if she needs further assistance.

## 2023-11-16 NOTE — Progress Notes (Signed)
 Office Visit Note   [name]           Date of Birth: 1945-07-09           MRN: [id number] Visit Date: 11/16/2023              Requested by: Beth Worth HERO, MD [address] [place],  [place] [address] PCP: Beth Worth HERO, MD  Chief Complaint  Patient presents with   Right Foot - Wound Check      HPI: Discussed the use of AI scribe software for clinical note transcription with the patient, who gave verbal consent to proceed.  History of Present Illness Beth Holden is a 78 year old female who presents for toenail trimming and foot care.  She is concerned about cutting her own toenails due to fear of injuring her skin and potentially causing an infection. Her foot pain, which occurs at night, has increased over the past month.  She has been using a wound care product on her foot, which is running low. She plans to purchase more from a local pharmacy, despite her insurance not favoring it. The product is available over the counter and costs around twenty dollars.  She is planning a trip to [facility], where her son has arranged for direct flights to avoid changing planes. Her son has arranged for direct flights to avoid changing planes.  She notes a blister on the side of her toe caused by rubbing, which she plans to manage by wearing tight socks that prevent leg swelling. She uses a device to help put on these socks.     Assessment & Plan: Visit Diagnoses: No diagnosis found.  Plan: Assessment and Plan Assessment & Plan Right foot Wagner grade 1 ulcer beneath Charcot deformity in the setting of type 2 diabetes mellitus Wagner grade 1 ulcer beneath Charcot deformity, 2 cm, superficial, healing well, no infection. - Continue wound care regimen with over-the-counter products. - Ensure adequate supply of wound care products from pharmacy. - Follow up in four weeks.  Charcot foot deformity, right  Onychomycosis, right foot Two thickened,  discolored onychomycotic nails, trimmed without complications.      Follow-Up Instructions: Return in about 4 weeks (around 12/14/2023).   Ortho Exam  Patient is alert, oriented, no adenopathy, well-dressed, normal affect, normal respiratory effort. Physical Exam EXTREMITIES: Left transmetatarsal amputation site without ulcers or callus. Right foot with Wagner grade 1 ulcer beneath Charcot rocker bottom deformity. Ulcer 2 cm in diameter, superficial, no exposed bone or tendon after debridement. Two thickened discolored onychomycotic nails on right foot, trimmed without complications.  After informed consent a 10 blade knife was used to debride the skin and soft tissue back to healthy viable flat granulation tissue.  There is no tunneling no exposed bone or tendon, silver  nitrate was used for hemostasis and a Band-Aid was applied.      Imaging: No results found. No images are attached to the encounter.  Labs: Lab Results  Component Value Date   HGBA1C 6.2 (A) 08/09/2023   HGBA1C 6.4 (A) 04/03/2023   HGBA1C 6.7 (H) 09/21/2022   ESRSEDRATE 51 (H) 12/24/2019   ESRSEDRATE 36 (H) 02/24/2017   CRP 13.2 (H) 12/24/2019   CRP 1.2 (H) 02/24/2017   LABURIC 5.5 09/01/2015   REPTSTATUS 06/03/2020 FINAL 05/29/2020   CULT  05/29/2020    NO GROWTH 5 DAYS Performed at [facility], [address] [place], [place] [address]  LABORGA GROUP B STREP (S.AGALACTIAE) ISOLATED 12/26/2014     Lab Results  Component Value Date   ALBUMIN 4.1 11/05/2021   ALBUMIN 4.1 11/05/2021   ALBUMIN 3.8 09/08/2020   PREALBUMIN 19.4 03/05/2012    No results found for: MG No results found for: VD25OH  Lab Results  Component Value Date   PREALBUMIN 19.4 03/05/2012      Latest Ref Rng & Units 06/20/2023    8:05 AM 05/31/2023    8:09 AM 12/16/2022    7:59 AM  CBC EXTENDED  WBC 4.0 - 10.5 K/uL 9.4  10.3  10.6   RBC 3.87 - 5.11 MIL/uL 4.06  4.09  4.18   Hemoglobin 12.0 - 15.0 g/dL 87.8  87.8   87.9   HCT 36.0 - 46.0 % 35.7  36.0  37.0   Platelets 150 - 400 K/uL 401  225  417   NEUT# 1.7 - 7.7 K/uL 6.2   6.8   Lymph# 0.7 - 4.0 K/uL 2.2   2.8      There is no height or weight on file to calculate BMI.  Orders:  No orders of the defined types were placed in this encounter.  No orders of the defined types were placed in this encounter.    Procedures: No procedures performed  Clinical Data: No additional findings.  ROS:  All other systems negative, except as noted in the HPI. Review of Systems  Objective: Vital Signs: There were no vitals taken for this visit.  Specialty Comments:  No specialty comments available.  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) 10/23/2019   Nuclear sclerotic cataract of both eyes 10/23/2019   Posterior vitreous detachment of right eye 10/23/2019   Retinal hemorrhage of left eye 10/23/2019   Impingement syndrome of right shoulder 07/06/2017   Iron deficiency anemia 05/11/2017   Anemia 02/24/2017   Baker's cyst 07/10/2016   Onychomycosis 12/07/2015   Upper airway cough syndrome 03/05/2014   Hypertension associated with diabetes (HCC) 12/05/2006   T2DM (type 2 diabetes mellitus) (HCC) 10/12/2006   Dyslipidemia associated with type 2 diabetes mellitus (HCC) 10/12/2006   Allergic rhinitis 10/12/2006   GERD - Followed by Dr. Aneita 10/12/2006   IRRITABLE BOWEL SYNDROME - followed by Dr. Aneita 10/12/2006   Peripheral neuropathy 10/11/2006   ALOPECIA NEC 10/11/2006   Past Medical History:  Diagnosis Date   Alopecia    Anemia, mild    Arthritis    Chronic cough     sees pulmonologist   Chronic pain    chest wall and abd - s/p extensive eval   Diabetes mellitus with neuropathy (HCC)    sees endocrine   Diverticulosis    Dyspnea    Fatty liver    GERD (gastroesophageal reflux disease)    takes Nexium  bid, hx erosive esophagitis   Headache(784.0)    occasionally;r/t sinus    History of colon polyps    HTN (hypertension)    Hx of amputation of lesser toe    sees podiatrist   Hyperlipemia    IBS (irritable bowel syndrome)    Insomnia  takes Elavil  nightly   Joint pain    Neuropathy    Neuropathy    Osteomyelitis (HCC)    Pneumonia    89/2/22- patient denies   PONV (postoperative nausea and vomiting)    medication did not help with surgery on 03/28/20   Seasonal allergies    takes Zyrtec  daily   Sinus tachycardia     Family History  Problem Relation Age of Onset   Lung cancer Mother 44       smoked heavily   Emphysema Mother    Hypertension Father    Hyperlipidemia Father    Diabetes Father    Coronary artery disease Father    Dementia Father    COPD Father        smoked   Stomach cancer Paternal Aunt    Brain cancer Paternal Uncle    Irritable bowel syndrome Other        Several family members on fathers side    Diabetes Other    Stomach cancer Maternal Aunt    Lung cancer Paternal Uncle    Heart disease Paternal Uncle    Colon cancer Neg Hx     Past Surgical History:  Procedure Laterality Date   AMPUTATION  12/28/2011   Procedure: AMPUTATION DIGIT;  Surgeon: Jerona LULLA Sage, MD;  Location: [facility] OR;  Service: Orthopedics;  Laterality: Right;  Right Foot 2nd Toe Amputation at MTP (metatarsophalangeal joint)   AMPUTATION Right 04/25/2012   Procedure: Right Foot 3rd Toe Amputation;  Surgeon: Jerona LULLA Sage, MD;  Location: [facility] OR;  Service: Orthopedics;  Laterality: Right;  Right Foot Third Toe Amputation    AMPUTATION Right 07/27/2012   Procedure: Right 4th Toe Amputation at Metatarsophalangeal;  Surgeon: Jerona LULLA Sage, MD;   Location: [facility] OR;  Service: Orthopedics;  Laterality: Right;  Right 4th Toe Amputation at Metatarsophalangeal   AMPUTATION Left 07/15/2016   Procedure: Left 2nd Ray Amputation;  Surgeon: Sage Jerona LULLA, MD;  Location: [facility] OR;  Service: Orthopedics;  Laterality: Left;   AMPUTATION Left 11/18/2016   Procedure: Left 3rd and 4th Ray Amputation;  Surgeon: Sage Jerona LULLA, MD;  Location: [facility] OR;  Service: Orthopedics;  Laterality: Left;   AMPUTATION Right 03/29/2017   Procedure: RIGHT FOOT 3RD AND 4TH RAY AMPUTATION;  Surgeon: Sage Jerona LULLA, MD;  Location: [facility] OR;  Service: Orthopedics;  Laterality: Right;   AMPUTATION Left 08/12/2020   Procedure: LEFT TRANSMETATARSAL AMPUTATION;  Surgeon: Sage Jerona LULLA, MD;  Location: [facility] OR;  Service: Orthopedics;  Laterality: Left;   AMPUTATION Right 05/31/2023   Procedure: AMPUTATION, FOOT, RAY;  Surgeon: Sage Jerona LULLA, MD;  Location: [facility] OR;  Service: Orthopedics;  Laterality: Right;  RIGHT 3RD METATARSAL RESECTION   APPLICATION OF WOUND VAC Left 08/12/2020   Procedure: APPLICATION OF WOUND VAC;  Surgeon: Sage Jerona LULLA, MD;  Location: [facility] OR;  Service: Orthopedics;  Laterality: Left;   COLONOSCOPY     LAPAROSCOPIC APPENDECTOMY  01/05/2011   Procedure: APPENDECTOMY LAPAROSCOPIC;  Surgeon: Donnice KATHEE Lunger, MD;  Location: [facility];  Service: General;  Laterality: N/A;   OOPHORECTOMY  2001   ROTATOR CUFF REPAIR Right    x 2   TUBAL LIGATION     VAGINAL HYSTERECTOMY  2001   Social History   Occupational History   Occupation: Retired   Tobacco Use   Smoking status: Never   Smokeless tobacco: Never  Vaping Use   Vaping status: Never Used  Substance and Sexual Activity  Alcohol use: Never   Drug use: Never   Sexual activity: Yes    Birth control/protection: Surgical

## 2023-11-16 NOTE — Progress Notes (Signed)
 Chief Complaint:  Beth Holden is a 78 y.o. female who presents today for a subsequent Medicare Annual Wellness Visit and to discuss management of her chronic medical problems.  Assessment/Plan:     Chronic Problems Addressed Today: T2DM (type 2 diabetes mellitus) (HCC) Follows with endocrinology.  A1c's have been well-controlled.  She is on Mounjaro  7.5 mg weekly.  CKD stage 3 due to type 2 diabetes mellitus (HCC) GFR was stable on recent labs-do not need to repeat today.  Dyslipidemia associated with type 2 diabetes mellitus (HCC) On Crestor  20 mg daily per cardiology.  Had lipids drawn a few weeks ago-do not need to repeat today.  Hypertension associated with diabetes (HCC) Blood pressure at goal today on irbesartan  HCTZ 150-12.5 once daily and metoprolol  succinate 25 mg daily.  Insomnia Her overall mood has improved the last several weeks though she is still going through the grief process due to her husband passing away several months ago.  She does have trazodone  to use as needed though has not had to use this.  She has good support at home.  She will let us  know if she needs further assistance.  Osteopenia Last DEXA 11/23 Will order repeat bone density scan.    Preventative Healthcare Order bone density scan.  She will get flu vaccine later this year after coming back from upcoming trip to [place].  Declined shingles vaccine.  She is not interested in future mammograms.  No longer needs colonoscopy due to age Health Maintenance Due  Topic Date Due   DEXA SCAN  11/13/2023   Medicare Annual Wellness (AWV)  11/16/2023    During the course of the visit the patient was educated and counseled about appropriate screening and preventive services including:        Fall prevention   Nutrition Physical Activity Weight Management Cognition    Subjective:  HPI:  See assessment / plan for status of chronic conditions.  Discussed the use of AI scribe software for  clinical note transcription with the patient, who gave verbal consent to proceed.  History of Present Illness Beth Holden is a 78 year old female who presents for a regular check-in and follow-up on her chronic conditions.  She experiences ongoing sleep disturbances, avoiding sleeping pills due to concerns about blood sugar levels. Her sleep quality is poor, attributed to a long-standing pattern of insufficient sleep and recent stressors, including grief and bereavement.  She has a new foot ulcer and a history of regular visits for foot concerns, though she has not seen her doctor in a month. She has a history of foot amputation and uses a cane for balance, especially when visiting the graveyard or feeling unsteady. She has a device to assist with compression stockings, which she finds challenging to use.  Her diabetes is managed with a medication dose of 7.5 mg, and she reports that her A1c has come down a lot. She has lost almost fifty pounds, aiming to reach 150 pounds. She experiences side effects from her medication, including pain two days after taking her shot, managed with small pills that alleviate the pain.  She manages constipation with Metamucil and small blue pills, as she relies on simple meals like sandwiches. She acknowledges that more fiber in her diet could help.  She discusses her social activities, including spending time with her grandchildren and planning a trip to [place] with her son. She stays active by 'running around all the time' unless advised otherwise.  She has not had any falls in the past year but notes some balance issues due to her foot amputation. She uses a cane when necessary to prevent falls.    Health Risk Assessment: Patient considers her overall health to be good. He has no difficulty performing the following: Preparing food and eating Bathing  Getting dressed Using the toilet Shopping Managing Finances Moving around from place to  place  She has not had any falls within the past year.      11/16/2023    7:47 AM  Depression screen PHQ 2/9  Decreased Interest 0  Down, Depressed, Hopeless 0  PHQ - 2 Score 0   Patient Care Team: Kennyth Worth HERO, MD as PCP - General (Family Medicine) Okey Vina GAILS, MD as PCP - Cardiology (Cardiology) Petrinitz, Reyes, DPM (Inactive) (Podiatry) Aneita Gwendlyn DASEN, MD (Inactive) as Consulting Physician (Gastroenterology) Trixie File, MD as Consulting Physician (Internal Medicine) Nieves Cough, MD as Consulting Physician (Urology) Alix Charleston, MD as Consulting Physician (Neurosurgery) Harden Jerona GAILS, MD as Consulting Physician (Orthopedic Surgery)   She has no acute complaints today.   ROS: Per HPI, otherwise a complete review of systems was negative.   PMH:  The following were reviewed and entered/updated in epic: Past Medical History:  Diagnosis Date   Alopecia    Anemia, mild    Arthritis    Chronic cough    sees pulmonologist   Chronic pain    chest wall and abd - s/p extensive eval   Diabetes mellitus with neuropathy (HCC)    sees endocrine   Diverticulosis    Dyspnea    Fatty liver    GERD (gastroesophageal reflux disease)    takes Nexium  bid, hx erosive esophagitis   Headache(784.0)    occasionally;r/t sinus    History of colon polyps    HTN (hypertension)    Hx of amputation of lesser toe    sees podiatrist   Hyperlipemia    IBS (irritable bowel syndrome)    Insomnia    takes Elavil  nightly   Joint pain    Neuropathy    Neuropathy    Osteomyelitis (HCC)    Pneumonia    89/2/22- patient denies   PONV (postoperative nausea and vomiting)    medication did not help with surgery on 03/28/20   Seasonal allergies    takes Zyrtec  daily   Sinus tachycardia    Patient Active Problem List   Diagnosis Date Noted   Insomnia 09/04/2023   Osteopenia Last DEXA 11/23 11/18/2021   CKD stage 3 due to type 2 diabetes mellitus (HCC) 11/10/2021    Mild cognitive impairment 11/10/2021   Coccyx pain 12/18/2020   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HCC) 10/28/2020   Memory loss 11/21/2019   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) 10/23/2019   Nuclear sclerotic cataract of both eyes 10/23/2019   Posterior vitreous detachment of right eye 10/23/2019   Retinal hemorrhage of left eye 10/23/2019   Impingement syndrome of right shoulder 07/06/2017   Iron deficiency anemia 05/11/2017   Anemia 02/24/2017   Baker's cyst 07/10/2016   Onychomycosis 12/07/2015   Upper airway cough syndrome 03/05/2014   Hypertension associated with diabetes (HCC) 12/05/2006   T2DM (type 2 diabetes mellitus) (HCC) 10/12/2006   Dyslipidemia associated with type 2 diabetes mellitus (HCC) 10/12/2006   Allergic rhinitis 10/12/2006   GERD - Followed by Dr. Aneita 10/12/2006   IRRITABLE BOWEL SYNDROME - followed by Dr. Aneita 10/12/2006  Peripheral neuropathy 10/11/2006   ALOPECIA NEC 10/11/2006   Past Surgical History:  Procedure Laterality Date   AMPUTATION  12/28/2011   Procedure: AMPUTATION DIGIT;  Surgeon: Jerona LULLA Sage, MD;  Location: [facility] OR;  Service: Orthopedics;  Laterality: Right;  Right Foot 2nd Toe Amputation at MTP (metatarsophalangeal joint)   AMPUTATION Right 04/25/2012   Procedure: Right Foot 3rd Toe Amputation;  Surgeon: Jerona LULLA Sage, MD;  [facility] OR;  Service: Orthopedics;  Laterality: Right;  Right Foot Third Toe Amputation    AMPUTATION Right 07/27/2012   Procedure: Right 4th Toe Amputation at Metatarsophalangeal;  Surgeon: Jerona LULLA Sage, MD;  Location: [facility] OR;  Service: Orthopedics;  Laterality: Right;  Right 4th Toe Amputation at Metatarsophalangeal   AMPUTATION Left 07/15/2016   Procedure: Left 2nd Ray Amputation;  Surgeon: Sage Jerona LULLA, MD;  Location: [facility] OR;  Service: Orthopedics;  Laterality: Left;   AMPUTATION Left 11/18/2016   Procedure: Left 3rd and 4th Ray Amputation;  Surgeon: Sage Jerona LULLA, MD;   Location: [facility] OR;  Service: Orthopedics;  Laterality: Left;   AMPUTATION Right 03/29/2017   Procedure: RIGHT FOOT 3RD AND 4TH RAY AMPUTATION;  Surgeon: Sage Jerona LULLA, MD;  Location: [facility] OR;  Service: Orthopedics;  Laterality: Right;   AMPUTATION Left 08/12/2020   Procedure: LEFT TRANSMETATARSAL AMPUTATION;  Surgeon: Sage Jerona LULLA, MD;  Location: [facility] OR;  Service: Orthopedics;  Laterality: Left;   AMPUTATION Right 05/31/2023   Procedure: AMPUTATION, FOOT, RAY;  Surgeon: Sage Jerona LULLA, MD;  Location: [facility] OR;  Service: Orthopedics;  Laterality: Right;  RIGHT 3RD METATARSAL RESECTION   APPLICATION OF WOUND VAC Left 08/12/2020   Procedure: APPLICATION OF WOUND VAC;  Surgeon: Sage Jerona LULLA, MD;  Location: [facility] OR;  Service: Orthopedics;  Laterality: Left;   COLONOSCOPY     LAPAROSCOPIC APPENDECTOMY  01/05/2011   Procedure: APPENDECTOMY LAPAROSCOPIC;  Surgeon: Donnice KATHEE Lunger, MD;  Location: [facility] ORS;  Service: General;  Laterality: N/A;   OOPHORECTOMY  2001   ROTATOR CUFF REPAIR Right    x 2   TUBAL LIGATION     VAGINAL HYSTERECTOMY  2001    Family History  Problem Relation Age of Onset   Lung cancer Mother 53       smoked heavily   Emphysema Mother    Hypertension Father    Hyperlipidemia Father    Diabetes Father    Coronary artery disease Father    Dementia Father    COPD Father        smoked   Stomach cancer Paternal Aunt    Brain cancer Paternal Uncle    Irritable bowel syndrome Other        Several family members on fathers side    Diabetes Other    Stomach cancer Maternal Aunt    Lung cancer Paternal Uncle    Heart disease Paternal Uncle    Colon cancer Neg Hx     Medications- reviewed and updated Current Outpatient Medications  Medication Sig Dispense Refill   acetaminophen  (TYLENOL ) 500 MG tablet Take 500 mg by mouth every 8 (eight) hours as needed for mild pain (pain score 1-3) or moderate pain (pain score 4-6).     azelastine  (ASTELIN ) 0.1 % nasal spray Place 2 sprays into  both nostrils 2 (two) times daily. (Patient taking differently: Place 2 sprays into both nostrils 2 (two) times daily as needed for allergies.) 30 mL 12   betamethasone  dipropionate (DIPROLENE ) 0.05 % cream Apply 1 application topically  2 (two) times daily as needed (IRRITATION). 30 g 0   Blood Glucose Monitoring Suppl (ONE TOUCH ULTRA 2) w/Device KIT Use to check blood sugar 3-4 times a day DxCode:E11.9 1 kit 0   Calcium  Carb-Cholecalciferol  (CALCIUM  + VITAMIN D3) 600-10 MG-MCG TABS Take 2 tablets by mouth daily. 90 tablet 3   Cholecalciferol  (VITAMIN D3) 250 MCG (10000 UT) capsule Take 10,000 Units by mouth daily.     clotrimazole  (CLOTRIMAZOLE  AF) 1 % cream Apply 1 Application topically 2 (two) times daily. (Patient taking differently: Apply 1 Application topically 2 (two) times daily as needed (Rash).) 35.4 g 2   ferrous sulfate 325 (65 FE) MG tablet Take 325 mg by mouth daily with breakfast.     fexofenadine  (ALLEGRA  ALLERGY) 180 MG tablet Take 1 tablet (180 mg total) by mouth daily. 100 tablet 3   Fluocinolone  Acetonide 0.01 % OIL Place 1-2 drops into both ears daily. As needed (Patient taking differently: Place 1-2 drops into both ears 2 (two) times a week.) 20 mL 0   gabapentin  (NEURONTIN ) 100 MG capsule TAKE 2 TO 3 CAPSULES AT    BEDTIME 180 capsule 5   gabapentin  (NEURONTIN ) 100 MG capsule Take 2-3 capsules at bedtime 21 capsule 0   glucagon  (GLUCAGEN) 1 MG SOLR injection Inject 1 mg into the muscle once as needed for up to 1 dose for low blood sugar. 1 each 11   glucose blood (ONETOUCH ULTRA) test strip Use to check blood sugar 3-4 times a day DxCode:E11.9 400 each 5   Insulin  Pen Needle 32G X 4 MM MISC Use 1x a day 100 each 3   insulin  regular (NOVOLIN R RELION) 100 units/mL injection Inject 0.2-0.3 mLs (20-30 Units total) into the skin 3 (three) times daily before meals. Per sliding scale (Patient taking differently: Inject 10-15 Units into the skin 2 (two) times daily before a meal. Per  sliding scale)     Insulin  Syringe-Needle U-100 (RELION INSULIN  SYRINGE 1ML/31G) 31G X 5/16 1 ML MISC USE 2 TIMES A DAY 100 each 2   irbesartan -hydrochlorothiazide  (AVALIDE) 150-12.5 MG tablet TAKE 1 TABLET DAILY 90 tablet 3   Lancets (ONETOUCH ULTRASOFT) lancets Use to check blood sugar 3-4 times a day DxCode:E11.9 100 each 12   metFORMIN  (GLUCOPHAGE ) 1000 MG tablet TAKE 1 TABLET TWICE DAILY  WITH MEALS 180 tablet 3   metoprolol  succinate (TOPROL -XL) 25 MG 24 hr tablet TAKE 1 TABLET DAILY 90 tablet 0   omeprazole  (PRILOSEC) 40 MG capsule TAKE 1 CAPSULE TWICE DAILY 180 capsule 3   OVER THE COUNTER MEDICATION Apply 1-3 application  topically at bedtime as needed (foot pain). TOPRICIN FOOT CREAM - NEUROPATHY FOOT CREAM **APPLIES TO BOTH FEET AT BEDTIME**     Probiotic Product (PROBIOTIC DAILY PO) Take 1 capsule by mouth daily.     rosuvastatin  (CRESTOR ) 20 MG tablet TAKE 1 TABLET DAILY 90 tablet 0   silver  sulfADIAZINE  (SILVADENE ) 1 % cream Apply 1 Application topically daily. (Patient taking differently: Apply 1 Application topically daily as needed (Wound care).) 50 g 0   tirzepatide  (MOUNJARO ) 7.5 MG/0.5ML Pen Inject 7.5 mg into the skin once a week. 6 mL 3   traZODone  (DESYREL ) 50 MG tablet Take 0.5-1 tablets (25-50 mg total) by mouth at bedtime. 30 tablet 5   vitamin B-12 (CYANOCOBALAMIN ) 1000 MCG tablet Take 1,000 mcg by mouth daily.     vitamin C (ASCORBIC ACID ) 500 MG tablet Take 500 mg by mouth in the morning.  sulfamethoxazole -trimethoprim  (BACTRIM  DS) 800-160 MG tablet Take 1 tablet by mouth 2 (two) times daily. 20 tablet 0   No current facility-administered medications for this visit.    Allergies-reviewed and updated Allergies  Allergen Reactions   Doxycycline  Calcium      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     Social History   Socioeconomic History   Marital status: Married     Spouse name: Not on file   Number of children: 2   Years of education: Not on file   Highest education level: Some college, no degree  Occupational History   Occupation: Retired   Tobacco Use   Smoking status: Never   Smokeless tobacco: Never  Vaping Use   Vaping status: Never Used  Substance and Sexual Activity   Alcohol use: Never   Drug use: Never   Sexual activity: Yes    Birth control/protection: Surgical  Other Topics Concern   Not on file  Social History Narrative   Caffeine daily    [employer], [employer] no diploma   Married '66   1 dtr- '78; 1 son '71; 2 grandchildren   Occupation: retired 04   Dad with alzheimers-had to place in [place] (summer '10)         Social Drivers of Corporate Investment Banker Strain: Low Risk  (11/15/2023)   Overall Financial Resource Strain (CARDIA)    Difficulty of Paying Living Expenses: Not hard at all  Food Insecurity: No Food Insecurity (11/15/2023)   Hunger Vital Sign    Worried About Running Out of Food in the Last Year: Never true    Ran Out of Food in the Last Year: Never true  Transportation Needs: No Transportation Needs (11/15/2023)   PRAPARE - Administrator, Civil Service (Medical): No    Lack of Transportation (Non-Medical): No  Physical Activity: Inactive (11/15/2023)   Exercise Vital Sign    Days of Exercise per Week: 0 days    Minutes of Exercise per Session: Not on file  Stress: No Stress Concern Present (11/15/2023)   [employer] of Occupational Health - Occupational Stress Questionnaire    Feeling of Stress: Only a little  Social Connections: Moderately Integrated (11/15/2023)   Social Connection and Isolation Panel    Frequency of Communication with Friends and Family: More than three times a week    Frequency of Social Gatherings with Friends and Family: More than three times a week    Attends Religious Services: More than 4 times per year    Active Member of [employer] or Organizations: Yes    Attends Tax Inspector Meetings: More than 4 times per year    Marital Status: Widowed         Objective/Observations  Physical Exam: BP 102/62   Pulse 84   Temp (!) 97.4 F (36.3 C) (Temporal)   Ht 5' 5 (1.651 m)   Wt 183 lb 6.4 oz (83.2 kg)   SpO2 96%   BMI 30.52 kg/m  Wt Readings from Last 3 Encounters:  11/16/23 183 lb 6.4 oz (83.2 kg)  10/26/23 189 lb (85.7 kg)  09/04/23 187 lb 6.4 oz (85 kg)    Gen: NAD, resting comfortably HEENT: TMs normal bilaterally. OP clear. No thyromegaly noted.  CV: RRR with no murmurs appreciated Pulm: NWOB, CTAB with no crackles, wheezes, or rhonchi GI: Normal bowel sounds  present. Soft, Nontender, Nondistended. MSK: no edema, cyanosis, or clubbing noted Skin: warm, dry Neuro: CN2-12 grossly intact. Strength 5/5 in upper and lower extremities. Reflexes symmetric and intact bilaterally. Normal minicog with 3/3 delayed word recall. Psych: Normal affect and thought content      Luvern Mcisaac M. Kennyth, MD 11/16/2023 8:23 AM

## 2023-11-16 NOTE — Assessment & Plan Note (Signed)
 On Crestor  20 mg daily per cardiology.  Had lipids drawn a few weeks ago-do not need to repeat today.

## 2023-11-16 NOTE — Assessment & Plan Note (Signed)
 GFR was stable on recent labs-do not need to repeat today.

## 2023-11-16 NOTE — Patient Instructions (Addendum)
 It was very nice to see you today!  VISIT SUMMARY: Today, we reviewed your chronic conditions and discussed your ongoing sleep disturbances, a new foot ulcer, and your diabetes management. We also talked about your constipation, bone health, and general health maintenance, including vaccinations.  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 -Continue taking Mounjaro  7.5 mg as prescribed. -Discuss a potential dosage increase with your endocrinologist to further aid in weight loss and reduce cardiovascular risk.  INSOMNIA: You are experiencing chronic insomnia, which is worsened by stress and bereavement. -Continue to monitor your sleep patterns.  CONSTIPATION: Your constipation is likely related to your diabetes medication, which can slow digestion and cause gas pains. -Continue using Metamucil for fiber supplementation. -Consider using simethicone for gas relief.  OTHER SPECIFIED DISORDERS OF BONE DENSITY AND STRUCTURE: We discussed your risk of osteoporosis and the need for a bone density scan. -A bone density scan has been ordered. -We will discuss potential treatment with Prolia based on the scan results.  GENERAL HEALTH MAINTENANCE: We discussed your vaccinations and general health maintenance. -Plan to receive the flu shot after your trip to [facility]. -Consider getting the shingles vaccine at a pharmacy if you decide to pursue it.  Return in about 1 year (around 11/15/2024) for Annual Physical.   Take care, Dr Kennyth  PLEASE NOTE:  If you had any lab tests, please let us  know if you have not heard back within a few days. You may see your results on mychart before we have a chance to review them but we will give you a call once they are reviewed by us .   If we ordered any referrals today, please let us  know if you have not  heard from their office within the next week.   If you had any urgent prescriptions sent in today, please check with the pharmacy within an hour of our visit to make sure the prescription was transmitted appropriately.   Please try these tips to maintain a healthy lifestyle:  Eat at least 3 REAL meals and 1-2 snacks per day.  Aim for no more than 5 hours between eating.  If you eat breakfast, please do so within one hour of getting up.   Each meal should contain half fruits/vegetables, one quarter protein, and one quarter carbs (no bigger than a computer mouse)  Cut down on sweet beverages. This includes juice, soda, and sweet tea.   Drink at least 1 glass of water with each meal and aim for at least 8 glasses per day  Exercise at least 150 minutes every week.    Preventive Care 69 Years and Older, Female Preventive care refers to lifestyle choices and visits with your health care provider that can promote health and wellness. Preventive care visits are also called wellness exams. What can I expect for my preventive care visit? Counseling Your health care provider may ask you questions about your: Medical history, including: Past medical problems. Family medical history. Pregnancy and menstrual history. History of falls. Current health, including: Memory and ability to understand (cognition). Emotional well-being. Home life and relationship well-being. Sexual activity and sexual health. Lifestyle, including: Alcohol, nicotine or tobacco, and drug use. Access to firearms. Diet, exercise, and sleep habits. Work and work astronomer. Sunscreen use. Safety issues such as seatbelt and bike helmet use. Physical exam Your health care provider will check your: Height  and weight. These may be used to calculate your BMI (body mass index). BMI is a measurement that tells if you are at a healthy weight. Waist circumference. This measures the distance around your waistline. This  measurement also tells if you are at a healthy weight and may help predict your risk of certain diseases, such as type 2 diabetes and high blood pressure. Heart rate and blood pressure. Body temperature. Skin for abnormal spots. What immunizations do I need?  Vaccines are usually given at various ages, according to a schedule. Your health care provider will recommend vaccines for you based on your age, medical history, and lifestyle or other factors, such as travel or where you work. What tests do I need? Screening Your health care provider may recommend screening tests for certain conditions. This may include: Lipid and cholesterol levels. Hepatitis C test. Hepatitis B test. HIV (human immunodeficiency virus) test. STI (sexually transmitted infection) testing, if you are at risk. Lung cancer screening. Colorectal cancer screening. Diabetes screening. This is done by checking your blood sugar (glucose) after you have not eaten for a while (fasting). Mammogram. Talk with your health care provider about how often you should have regular mammograms. BRCA-related cancer screening. This may be done if you have a family history of breast, ovarian, tubal, or peritoneal cancers. Bone density scan. This is done to screen for osteoporosis. Talk with your health care provider about your test results, treatment options, and if necessary, the need for more tests. Follow these instructions at home: Eating and drinking  Eat a diet that includes fresh fruits and vegetables, whole grains, lean protein, and low-fat dairy products. Limit your intake of foods with high amounts of sugar, saturated fats, and salt. Take vitamin and mineral supplements as recommended by your health care provider. Do not drink alcohol if your health care provider tells you not to drink. If you drink alcohol: Limit how much you have to 0-1 drink a day. Know how much alcohol is in your drink. In the [place] one drink equals one 12  oz bottle of beer (355 mL), one 5 oz glass of wine (148 mL), or one 1 oz glass of hard liquor (44 mL). Lifestyle Brush your teeth every morning and night with fluoride toothpaste. Floss one time each day. Exercise for at least 30 minutes 5 or more days each week. Do not use any products that contain nicotine or tobacco. These products include cigarettes, chewing tobacco, and vaping devices, such as e-cigarettes. If you need help quitting, ask your health care provider. Do not use drugs. If you are sexually active, practice safe sex. Use a condom or other form of protection in order to prevent STIs. Take aspirin only as told by your health care provider. Make sure that you understand how much to take and what form to take. Work with your health care provider to find out whether it is safe and beneficial for you to take aspirin daily. Ask your health care provider if you need to take a cholesterol-lowering medicine (statin). Find healthy ways to manage stress, such as: Meditation, yoga, or listening to music. Journaling. Talking to a trusted person. Spending time with friends and family. Minimize exposure to UV radiation to reduce your risk of skin cancer. Safety Always wear your seat belt while driving or riding in a vehicle. Do not drive: If you have been drinking alcohol. Do not ride with someone who has been drinking. When you are tired or distracted. While texting. If you  have been using any mind-altering substances or drugs. Wear a helmet and other protective equipment during sports activities. If you have firearms in your house, make sure you follow all gun safety procedures. What's next? Visit your health care provider once a year for an annual wellness visit. Ask your health care provider how often you should have your eyes and teeth checked. Stay up to date on all vaccines. This information is not intended to replace advice given to you by your health care provider. Make sure you  discuss any questions you have with your health care provider. Document Revised: 06/24/2020 Document Reviewed: 06/24/2020 Elsevier Patient Education  2024 [employer].

## 2023-11-16 NOTE — Assessment & Plan Note (Signed)
 Blood pressure at goal today on irbesartan  HCTZ 150-12.5 once daily and metoprolol  succinate 25 mg daily.

## 2023-11-19 ENCOUNTER — Encounter: Payer: Self-pay | Admitting: Internal Medicine

## 2023-11-20 ENCOUNTER — Encounter: Payer: Self-pay | Admitting: Family Medicine

## 2023-11-21 ENCOUNTER — Other Ambulatory Visit: Payer: Self-pay | Admitting: Internal Medicine

## 2023-11-21 DIAGNOSIS — E1159 Type 2 diabetes mellitus with other circulatory complications: Secondary | ICD-10-CM

## 2023-11-21 NOTE — Telephone Encounter (Signed)
 Refill request complete

## 2023-11-21 NOTE — Telephone Encounter (Signed)
 Please advise on if patient should come into the office to be seen or if she should continue the treatment plan or change it.  Thank you!

## 2023-11-22 NOTE — Telephone Encounter (Signed)
 I contacted patient via MyChart. If no response within twenty four hours I will call patient for definite response on her dose of gabapentin .

## 2023-11-22 NOTE — Telephone Encounter (Signed)
 I appreciate the update.  I think it is reasonable for us  to increase the dose of gabapentin  but can we clarify what doses she is currently taking?

## 2023-11-23 NOTE — Telephone Encounter (Signed)
 Called and spoke to patient. We scheduled an in person visit for next week as she is currently out of town.

## 2023-11-23 NOTE — Telephone Encounter (Signed)
**Note De-identified  [place] Obfuscation** Please advise 

## 2023-11-23 NOTE — Telephone Encounter (Signed)
 Please schedule appointment.

## 2023-11-30 ENCOUNTER — Encounter: Payer: Self-pay | Admitting: Family Medicine

## 2023-11-30 ENCOUNTER — Ambulatory Visit: Admitting: Family Medicine

## 2023-11-30 VITALS — BP 120/70 | HR 73 | Temp 97.5°F | Ht 65.0 in | Wt 190.4 lb

## 2023-11-30 DIAGNOSIS — E1142 Type 2 diabetes mellitus with diabetic polyneuropathy: Secondary | ICD-10-CM

## 2023-11-30 DIAGNOSIS — I152 Hypertension secondary to endocrine disorders: Secondary | ICD-10-CM

## 2023-11-30 DIAGNOSIS — E1165 Type 2 diabetes mellitus with hyperglycemia: Secondary | ICD-10-CM

## 2023-11-30 DIAGNOSIS — M8580 Other specified disorders of bone density and structure, unspecified site: Secondary | ICD-10-CM

## 2023-11-30 DIAGNOSIS — Z794 Long term (current) use of insulin: Secondary | ICD-10-CM

## 2023-11-30 DIAGNOSIS — E1159 Type 2 diabetes mellitus with other circulatory complications: Secondary | ICD-10-CM

## 2023-11-30 DIAGNOSIS — E2839 Other primary ovarian failure: Secondary | ICD-10-CM | POA: Diagnosis not present

## 2023-11-30 LAB — POCT GLYCOSYLATED HEMOGLOBIN (HGB A1C): Hemoglobin A1C: 6.4 % — AB (ref 4.0–5.6)

## 2023-11-30 MED ORDER — GABAPENTIN 300 MG PO CAPS: 300.0000 mg | ORAL_CAPSULE | Freq: Two times a day (BID) | ORAL | 3 refills | Status: AC

## 2023-11-30 NOTE — Progress Notes (Signed)
   Beth Holden is a 78 y.o. female who presents today for an office visit.  Assessment/Plan:  Chronic Problems Addressed Today: Diabetic neuropathy (HCC) Pain not fully controlled.  We discussed potential treatment options.  She is currently on gabapentin  2 to 300 mg nightly.  Will increase this to 300 mg twice daily.  Also advised patient she can take an extra 300 mg at night if needed.  She will follow-up with us  in a few weeks via MyChart.  She is aware potential side effects.  Type 2 diabetes mellitus with hyperglycemia (HCC) A1c very well-controlled today at 6.4.  She would like for us  to take over management for her diabetes.  Her last several A1c's have been in the 6 range on Mounjaro7.5 mg weekly, metformin  1000 mg twice daily and sliding scale insulin .  Will continue her current regimen for now.  Recheck in 3 months.  May consider increasing dose of Mounjaro  and gradually down titrating her insulin  dose depending on her A1c control  Hypertension associated with diabetes (HCC) At goal today on irbesartan  hydrochlorothiazide  150-12.5 once daily and metoprolol  succinate 25 mg daily.  Osteopenia Last DEXA 11/23 Will order repeat bone density scan.     Subjective:  HPI:  See assessment / plan for status of chronic conditions.   Discussed the use of AI scribe software for clinical note transcription with the patient, who gave verbal consent to proceed.  History of Present Illness Beth Holden is a 78 year old female with neuropathy who presents with a severe flare-up of neuropathic pain.  She experienced a severe flare-up of her neuropathy, describing the pain as feeling like 'somebody was holding fire.' This episode began a couple of weeks ago on a Saturday night and lasted for two days. She usually manages her neuropathy with gabapentin , taking 100 mg three times a day, but during this flare-up, she increased her dose to three pills at a time due to the intensity of  the pain. Her son, a teacher, early years/pre, advised her that her current dose was low and that she could safely increase it, as some people take up to 3600 mg. However, she was hesitant to adjust her medication without professional guidance.  She contacted her diabetic doctor via MyChart for advice on increasing her gabapentin  dosage but received a delayed response from the nurse, suggesting she consult her general practitioner.Her insurance coverage for Mounjaro  has varied, with significant out-of-pocket costs at times.  She has been managing her diabetes with Mounjaro  and insulin . She checks her blood sugar at home, noting that it is usually highest in the morning. Recently, she has been taking 18 units of insulin , particularly in the morning. Her A1c has been typically in the 6 range, and she has been on Mounjaro  for some time.         Objective:  Physical Exam: BP 120/70   Pulse 73   Temp (!) 97.5 F (36.4 C) (Temporal)   Ht 5' 5 (1.651 m)   Wt 190 lb 6.4 oz (86.4 kg)   SpO2 98%   BMI 31.68 kg/m   Gen: No acute distress, resting comfortably Neuro: Grossly normal, moves all extremities Psych: Normal affect and thought content      October Peery M. Kennyth, MD 11/30/2023 8:01 AM

## 2023-11-30 NOTE — Assessment & Plan Note (Signed)
 At goal today on irbesartan  hydrochlorothiazide  150-12.5 once daily and metoprolol  succinate 25 mg daily.

## 2023-11-30 NOTE — Assessment & Plan Note (Signed)
 Pain not fully controlled.  We discussed potential treatment options.  She is currently on gabapentin  2 to 300 mg nightly.  Will increase this to 300 mg twice daily.  Also advised patient she can take an extra 300 mg at night if needed.  She will follow-up with us  in a few weeks via MyChart.  She is aware potential side effects.

## 2023-11-30 NOTE — Assessment & Plan Note (Signed)
 Will order repeat bone density scan.

## 2023-11-30 NOTE — Patient Instructions (Signed)
 It was very nice to see you today!  VISIT SUMMARY: During your visit, we discussed your severe flare-up of neuropathic pain and your diabetes management. We adjusted your gabapentin  dosage to better control your neuropathic pain and reviewed your current diabetes treatment plan. We also scheduled a bone density scan to monitor your osteopenia.  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 However, you have been experiencing issues with neuropathic pain and insurance coverage for Mounjaro . -Continue your current diabetes management regimen with Mounjaro  and insulin . -Consider increasing your Mounjaro  dose to reduce insulin  dependency. -Follow up in 3 months to reassess your diabetes management and the effectiveness of gabapentin .  DIABETIC NEUROPATHIC PAIN: Your neuropathic pain has worsened, leading to severe flare-ups. -Increase your gabapentin  dose to 300 mg twice daily. If needed, you can take an extra dose at night -A prescription for gabapentin  300 mg capsules has been sent to your pharmacy. -Monitor for drowsiness as a side effect. -Send a MyChart message in 2 weeks to update us  on the effectiveness of the increased gabapentin  dose.  OSTEOPENIA: You have osteopenia, which requires monitoring and management. -A bone density scan is scheduled to monitor your osteopenia.  Return in about 3 months (around 03/01/2024) for Follow Up.   Take care, Dr Kennyth  PLEASE NOTE:  If you had any lab tests, please let us  know if you have not heard back within a few days. You may see your results on mychart before we have a chance to review them but we will give you a call once they are reviewed by us .   If we ordered any referrals today, please let us  know if you have not heard from their office within the next week.   If you had any urgent prescriptions sent in today, please check with the pharmacy within an hour  of our visit to make sure the prescription was transmitted appropriately.   Please try these tips to maintain a healthy lifestyle:  Eat at least 3 REAL meals and 1-2 snacks per day.  Aim for no more than 5 hours between eating.  If you eat breakfast, please do so within one hour of getting up.   Each meal should contain half fruits/vegetables, one quarter protein, and one quarter carbs (no bigger than a computer mouse)  Cut down on sweet beverages. This includes juice, soda, and sweet tea.   Drink at least 1 glass of water with each meal and aim for at least 8 glasses per day  Exercise at least 150 minutes every week.

## 2023-11-30 NOTE — Assessment & Plan Note (Signed)
 A1c very well-controlled today at 6.4.  She would like for us  to take over management for her diabetes.  Her last several A1c's have been in the 6 range on Mounjaro7.5 mg weekly, metformin  1000 mg twice daily and sliding scale insulin .  Will continue her current regimen for now.  Recheck in 3 months.  May consider increasing dose of Mounjaro  and gradually down titrating her insulin  dose depending on her A1c control

## 2023-12-01 ENCOUNTER — Ambulatory Visit: Admitting: Family

## 2023-12-01 ENCOUNTER — Encounter: Payer: Self-pay | Admitting: Family

## 2023-12-01 DIAGNOSIS — M14679 Charcot's joint, unspecified ankle and foot: Secondary | ICD-10-CM

## 2023-12-01 DIAGNOSIS — L97511 Non-pressure chronic ulcer of other part of right foot limited to breakdown of skin: Secondary | ICD-10-CM

## 2023-12-01 MED ORDER — SULFAMETHOXAZOLE-TRIMETHOPRIM 800-160 MG PO TABS: 1.0000 | ORAL_TABLET | Freq: Two times a day (BID) | ORAL | 0 refills | Status: DC

## 2023-12-01 NOTE — Progress Notes (Unsigned)
 Office Visit Note   [name]           Date of Birth: 11-02-1945           MRN: [id number] Visit Date: 12/01/2023              Requested by: Kennyth Worth HERO, MD [address] [place],  [place] [address] PCP: Kennyth Worth HERO, MD  Chief Complaint  Patient presents with   Right Foot - Wound Check      HPI: The patient is a 78 year old woman who returns in follow-up for Wagner grade 1 ulcer right foot she is concerned she has had some increased swelling in the foot calf and ankle as that leg she does remark she has about equal edema on the left lower extremity  Assessment & Plan: Visit Diagnoses: No diagnosis found.  Plan: Reassurance provided.  Venous insufficiency bilaterally.  She will continue with her current wound care regimen and follow-up in the office in about 4 weeks  Did provide an antibiotic that she can take on her trip she will call for any concerns prior to taking this.  Follow-Up Instructions: Return in about 4 weeks (around 12/29/2023), or if symptoms worsen or fail to improve.   Ortho Exam  Patient is alert, oriented, no adenopathy, well-dressed, normal affect, normal respiratory effort. On examination right foot and ankle there is trace edema without pitting.  No erythema.  Wagner grade 1 ulcer along the medial column.  This is 2 cm in diameter and superficial debrided of nonviable surrounding tissue there is no erythema warmth or purulence   Imaging: No results found. No images are attached to the encounter.  Labs: Lab Results  Component Value Date   HGBA1C 6.4 (A) 11/30/2023   HGBA1C 6.2 (A) 08/09/2023   HGBA1C 6.4 (A) 04/03/2023   ESRSEDRATE 51 (H) 12/24/2019   ESRSEDRATE 36 (H) 02/24/2017   CRP 13.2 (H) 12/24/2019   CRP 1.2 (H) 02/24/2017   LABURIC 5.5 09/01/2015   REPTSTATUS 06/03/2020 FINAL 05/29/2020   CULT  05/29/2020    NO GROWTH 5 DAYS Performed at [facility], [address] [place], [place] [address]     LABORGA GROUP B STREP (S.AGALACTIAE) ISOLATED 12/26/2014     Lab Results  Component Value Date   ALBUMIN 4.1 11/05/2021   ALBUMIN 4.1 11/05/2021   ALBUMIN 3.8 09/08/2020   PREALBUMIN 19.4 03/05/2012    No results found for: MG No results found for: [facility]  Lab Results  Component Value Date   PREALBUMIN 19.4 03/05/2012      Latest Ref Rng & Units 06/20/2023    8:05 AM 05/31/2023    8:09 AM 12/16/2022    7:59 AM  CBC EXTENDED  WBC 4.0 - 10.5 K/uL 9.4  10.3  10.6   RBC 3.87 - 5.11 MIL/uL 4.06  4.09  4.18   Hemoglobin 12.0 - 15.0 g/dL 87.8  87.8  87.9   HCT 36.0 - 46.0 % 35.7  36.0  37.0   Platelets 150 - 400 K/uL 401  225  417   NEUT# 1.7 - 7.7 K/uL 6.2   6.8   Lymph# 0.7 - 4.0 K/uL 2.2   2.8      There is no height or weight on file to calculate BMI.  Orders:  No orders of the defined types were placed in this encounter.  Meds ordered this encounter  Medications   sulfamethoxazole -trimethoprim  (BACTRIM  DS) 800-160 MG tablet  Sig: Take 1 tablet by mouth 2 (two) times daily.    Dispense:  20 tablet    Refill:  0     Procedures: No procedures performed  Clinical Data: No additional findings.  ROS:  All other systems negative, except as noted in the HPI. Review of Systems  Objective: Vital Signs: There were no vitals taken for this visit.  Specialty Comments:  No specialty comments available.  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) 10/23/2019   Nuclear sclerotic cataract of both eyes 10/23/2019   Posterior vitreous detachment of right eye 10/23/2019   Retinal hemorrhage of  left eye 10/23/2019   Impingement syndrome of right shoulder 07/06/2017   Iron deficiency anemia 05/11/2017   Anemia 02/24/2017   Baker's cyst 07/10/2016   Onychomycosis 12/07/2015   Upper airway cough syndrome 03/05/2014   Hypertension associated with diabetes (HCC) 12/05/2006   Type 2 diabetes mellitus with hyperglycemia (HCC) 10/12/2006   Dyslipidemia associated with type 2 diabetes mellitus (HCC) 10/12/2006   Allergic rhinitis 10/12/2006   GERD - Followed by Dr. Aneita 10/12/2006   IRRITABLE BOWEL SYNDROME - followed by Dr. Aneita 10/12/2006   Diabetic neuropathy (HCC) 10/11/2006   ALOPECIA NEC 10/11/2006   Past Medical History:  Diagnosis Date   Alopecia    Anemia, mild    Arthritis    Chronic cough    sees pulmonologist   Chronic pain    chest wall and abd - s/p extensive eval   Diabetes mellitus with neuropathy (HCC)    sees endocrine   Diverticulosis    Dyspnea    Fatty liver    GERD (gastroesophageal reflux disease)    takes Nexium  bid, hx erosive esophagitis   Headache(784.0)    occasionally;r/t sinus    History of colon polyps    HTN (hypertension)    Hx of amputation of lesser toe    sees podiatrist   Hyperlipemia    IBS (irritable bowel syndrome)    Insomnia    takes Elavil  nightly   Joint pain    Neuropathy    Neuropathy    Osteomyelitis (HCC)    Pneumonia    89/2/22- patient denies   PONV (postoperative nausea and vomiting)    medication did not help with surgery on 03/28/20   Seasonal allergies    takes Zyrtec  daily   Sinus tachycardia     Family History  Problem Relation Age of Onset   Lung cancer Mother 70       smoked heavily   Emphysema Mother    Hypertension Father    Hyperlipidemia Father    Diabetes Father    Coronary artery disease Father    Dementia Father    COPD Father        smoked   Stomach cancer Paternal Aunt    Brain cancer Paternal Uncle    Irritable bowel syndrome Other        Several family members on fathers side     Diabetes Other    Stomach cancer Maternal Aunt    Lung cancer Paternal Uncle    Heart disease Paternal  Uncle    Colon cancer Neg Hx     Past Surgical History:  Procedure Laterality Date   AMPUTATION  12/28/2011   Procedure: AMPUTATION DIGIT;  Surgeon: Jerona LULLA Sage, MD;  Location: [facility] OR;  Service: Orthopedics;  Laterality: Right;  Right Foot 2nd Toe Amputation at MTP (metatarsophalangeal joint)   AMPUTATION Right 04/25/2012   Procedure: Right Foot 3rd Toe Amputation;  Surgeon: Jerona LULLA Sage, MD;  Location: [facility] OR;  Service: Orthopedics;  Laterality: Right;  Right Foot Third Toe Amputation    AMPUTATION Right 07/27/2012   Procedure: Right 4th Toe Amputation at Metatarsophalangeal;  Surgeon: Jerona LULLA Sage, MD;  Location: [facility] OR;  Service: Orthopedics;  Laterality: Right;  Right 4th Toe Amputation at Metatarsophalangeal   AMPUTATION Left 07/15/2016   Procedure: Left 2nd Ray Amputation;  Surgeon: Sage Jerona LULLA, MD;  Location: [facility] OR;  Service: Orthopedics;  Laterality: Left;   AMPUTATION Left 11/18/2016   Procedure: Left 3rd and 4th Ray Amputation;  Surgeon: Sage Jerona LULLA, MD;  Location: [facility] OR;  Service: Orthopedics;  Laterality: Left;   AMPUTATION Right 03/29/2017   Procedure: RIGHT FOOT 3RD AND 4TH RAY AMPUTATION;  Surgeon: Sage Jerona LULLA, MD;  Location: [facility] OR;  Service: Orthopedics;  Laterality: Right;   AMPUTATION Left 08/12/2020   Procedure: LEFT TRANSMETATARSAL AMPUTATION;  Surgeon: Sage Jerona LULLA, MD;  Location: [facility] OR;  Service: Orthopedics;  Laterality: Left;   AMPUTATION Right 05/31/2023   Procedure: AMPUTATION, FOOT, RAY;  Surgeon: Sage Jerona LULLA, MD;  Location: [facility] OR;  Service: Orthopedics;  Laterality: Right;  RIGHT 3RD METATARSAL RESECTION   APPLICATION OF WOUND VAC Left 08/12/2020   Procedure: APPLICATION OF WOUND VAC;  Surgeon: Sage Jerona LULLA, MD;  Location: [facility] OR;  Service: Orthopedics;  Laterality: Left;   COLONOSCOPY     LAPAROSCOPIC APPENDECTOMY  01/05/2011   Procedure: APPENDECTOMY  LAPAROSCOPIC;  Surgeon: Donnice KATHEE Lunger, MD;  Location: [facility] ORS;  Service: General;  Laterality: N/A;   OOPHORECTOMY  2001   ROTATOR CUFF REPAIR Right    x 2   TUBAL LIGATION     VAGINAL HYSTERECTOMY  2001   Social History   Occupational History   Occupation: Retired   Tobacco Use   Smoking status: Never   Smokeless tobacco: Never  Vaping Use   Vaping status: Never Used  Substance and Sexual Activity   Alcohol use: Never   Drug use: Never   Sexual activity: Yes    Birth control/protection: Surgical

## 2023-12-11 ENCOUNTER — Ambulatory Visit: Admitting: Internal Medicine

## 2023-12-14 ENCOUNTER — Ambulatory Visit: Admitting: Orthopedic Surgery

## 2023-12-22 ENCOUNTER — Other Ambulatory Visit: Payer: Self-pay | Admitting: Internal Medicine

## 2023-12-25 ENCOUNTER — Ambulatory Visit: Payer: Self-pay

## 2023-12-25 ENCOUNTER — Telehealth: Payer: Self-pay | Admitting: Family Medicine

## 2023-12-25 NOTE — Telephone Encounter (Signed)
 FYI Only or Action Required?: FYI only for provider: Pt refuses UC  - really only wants to see Dr. Kennyth. Has appt for tomorrow in office  Patient was last seen in primary care on 11/30/2023 by Kennyth Worth HERO, MD.  Called Nurse Triage reporting Wheezing. - cough  Symptoms began a week ago.  Interventions attempted: OTC medications: mucinex .  Symptoms are: unchanged.  Triage Disposition: See HCP Within 4 Hours (Or PCP Triage)  Patient/caregiver understands and will follow disposition?: No, wishes to speak with PCP                  Reason for Disposition  Wheezing is present  Answer Assessment - Initial Assessment Questions 1. RESPIRATORY STATUS: Describe your breathing? (e.g., wheezing, shortness of breath, unable to speak, severe coughing)      wheezing 2. ONSET: When did this breathing problem begin?      1 week 3. PATTERN Does the difficult breathing come and go, or has it been constant since it started?      constant 4. SEVERITY: How bad is your breathing? (e.g., mild, moderate, severe)      *No Answer* 5. RECURRENT SYMPTOM: Have you had difficulty breathing before? If Yes, ask: When was the last time? and What happened that time?      *No Answer* 6. CARDIAC HISTORY: Do you have any history of heart disease? (e.g., heart attack, angina, bypass surgery, angioplasty)      *No Answer* 7. LUNG HISTORY: Do you have any history of lung disease?  (e.g., pulmonary embolus, asthma, emphysema)     *No Answer* 8. CAUSE: What do you think is causing the breathing problem?      Cold 9. OTHER SYMPTOMS: Do you have any other symptoms? (e.g., chest pain, cough, dizziness, fever, runny nose)     Cough - head congestion 10. O2 SATURATION MONITOR:  Do you use an oxygen saturation monitor (pulse oximeter) at home? If Yes, ask: What is your reading (oxygen level) today? What is your usual oxygen saturation reading? (e.g., 95%)       *No Answer* 11.  PREGNANCY: Is there any chance you are pregnant? When was your last menstrual period?       *No Answer* 12. TRAVEL: Have you traveled out of the country in the last month? (e.g., travel history, exposures)       *No Answer*  Answer Assessment - Initial Assessment Questions 1. ONSET: When did the cough begin?      1 week 2. SEVERITY: How bad is the cough today?      moderate 3. SPUTUM: Describe the color of your sputum (e.g., none, dry cough; clear, white, yellow, green)     Clear - white 4. HEMOPTYSIS: Are you coughing up any blood? If Yes, ask: How much? (e.g., flecks, streaks, tablespoons, etc.)     no 5. DIFFICULTY BREATHING: Are you having difficulty breathing? If Yes, ask: How bad is it? (e.g., mild, moderate, severe)      States she is wheezing 6. FEVER: Do you have a fever? If Yes, ask: What is your temperature, how was it measured, and when did it start?     no 10. OTHER SYMPTOMS: Do you have any other symptoms? (e.g., runny nose, wheezing, chest pain)       Wheezing  Protocols used: Breathing Difficulty-A-AH, Cough - Acute Productive-A-AH

## 2023-12-25 NOTE — Telephone Encounter (Signed)
 Copied from CRM #[id number]. Topic: Clinical - Refused Triage >> Dec 25, 2023  9:24 AM Ivette P wrote: Patient/caller voiced complaints of  Real bad cold, wheezing. Gums receding on one side. Declined transfer to triage.

## 2023-12-25 NOTE — Telephone Encounter (Signed)
 Appt tomorrow with Dr. Wendolyn

## 2023-12-25 NOTE — Telephone Encounter (Signed)
 Appt tomorrow

## 2023-12-25 NOTE — Telephone Encounter (Signed)
 Patient disconnected upon transfer to NT. 1st attempt to reach patient, left message.   Copied from CRM #[id number]. Topic: Clinical - Red Word Triage >> Dec 25, 2023  8:48 AM Beth Holden wrote: Red Word that prompted transfer to Nurse Triage: Real bad cold, wheezing. Gums receding on one side

## 2023-12-26 ENCOUNTER — Ambulatory Visit: Admitting: Family Medicine

## 2023-12-26 ENCOUNTER — Encounter: Payer: Self-pay | Admitting: Family Medicine

## 2023-12-26 VITALS — BP 118/66 | HR 78 | Temp 98.1°F | Ht 65.0 in | Wt 178.1 lb

## 2023-12-26 DIAGNOSIS — B9689 Other specified bacterial agents as the cause of diseases classified elsewhere: Secondary | ICD-10-CM

## 2023-12-26 DIAGNOSIS — Z794 Long term (current) use of insulin: Secondary | ICD-10-CM | POA: Diagnosis not present

## 2023-12-26 DIAGNOSIS — J208 Acute bronchitis due to other specified organisms: Secondary | ICD-10-CM | POA: Diagnosis not present

## 2023-12-26 DIAGNOSIS — Z7984 Long term (current) use of oral hypoglycemic drugs: Secondary | ICD-10-CM

## 2023-12-26 DIAGNOSIS — E119 Type 2 diabetes mellitus without complications: Secondary | ICD-10-CM

## 2023-12-26 MED ORDER — ALBUTEROL SULFATE HFA 108 (90 BASE) MCG/ACT IN AERS: 2.0000 | INHALATION_SPRAY | Freq: Four times a day (QID) | RESPIRATORY_TRACT | 0 refills | Status: AC | PRN

## 2023-12-26 MED ORDER — AZITHROMYCIN 250 MG PO TABS: ORAL_TABLET | ORAL | 0 refills | Status: AC

## 2023-12-26 MED ORDER — PREDNISONE 20 MG PO TABS: 40.0000 mg | ORAL_TABLET | Freq: Every day | ORAL | 0 refills | Status: AC

## 2023-12-26 NOTE — Patient Instructions (Signed)
 Albuterol  , zpak and prednisone   Worse, changes, let us  know

## 2023-12-26 NOTE — Progress Notes (Signed)
 Subjective:     Patient ID: Beth Holden, female    DOB: 1945-07-29, 78 y.o.   MRN: [id number]  Chief Complaint  Patient presents with   Cough    Pt has cough, wheezing, some congestion, no fever, but some chills x13 days    Discussed the use of AI scribe software for clinical note transcription with the patient, who gave verbal consent to proceed.  History of Present Illness Beth Holden is a 78 year old female with diabetes who presents with a persistent cough and cold symptoms. She is accompanied by her daughter, Beth Holden.  She has been experiencing a persistent cough for the past two weeks, which began before a trip to [facility]. Initially, she thought she was developing a cold, as she experienced rhinorrhea and nasal congestion. No fever, but she felt very cold on two occasions during her trip. She has been taking Mucinex  every twelve hours for her cough. She has used inhalers in the past for sinus infections but does not currently have one at home.  She reports shortness of breath and dizziness, which she attributes to dehydration. She has a history of diabetes and has had some amputations, contributing to her unsteadiness. No new swelling in her legs, although she does have some swelling due to her diabetes and uses support stockings.  She experienced diarrhea at the beginning of her trip, which resolved before returning home. She describes the diarrhea as 'pure black like water' and attributes it to not eating much. She has not had a bowel movement since the diarrhea resolved.  Her diabetes is managed with Mounjaro , insulin , and metformin . She monitors her blood sugar levels regularly, testing three to four times a day or more if she feels something is off.  She has a diabetic ulcer on her foot, for which she was prescribed sulfamethoxazole -trimethoprim  (Bactrim ) in November, although she has not taken it as the ulcer was not infected. She has the medication at home in  case of future need.    Health Maintenance Due  Topic Date Due   FOOT EXAM  09/03/2022   COVID-19 Vaccine (6 - 2025-26 season) 09/11/2023   Bone Density Scan  11/13/2023    Past Medical History:  Diagnosis Date   Alopecia    Anemia, mild    Arthritis    Chronic cough    sees pulmonologist   Chronic pain    chest wall and abd - s/p extensive eval   Diabetes mellitus with neuropathy (HCC)    sees endocrine   Diverticulosis    Dyspnea    Fatty liver    GERD (gastroesophageal reflux disease)    takes Nexium  bid, hx erosive esophagitis   Headache(784.0)    occasionally;r/t sinus    History of colon polyps    HTN (hypertension)    Hx of amputation of lesser toe    sees podiatrist   Hyperlipemia    IBS (irritable bowel syndrome)    Insomnia    takes Elavil  nightly   Joint pain    Neuropathy    Neuropathy    Osteomyelitis (HCC)    Pneumonia    89/2/22- patient denies   PONV (postoperative nausea and vomiting)    medication did not help with surgery on 03/28/20   Seasonal allergies    takes Zyrtec  daily   Sinus tachycardia     Past Surgical History:  Procedure Laterality Date   AMPUTATION  12/28/2011   Procedure: AMPUTATION DIGIT;  Surgeon: Jerona LULLA Sage, MD;  Location: [facility] OR;  Service: Orthopedics;  Laterality: Right;  Right Foot 2nd Toe Amputation at MTP (metatarsophalangeal joint)   AMPUTATION Right 04/25/2012   Procedure: Right Foot 3rd Toe Amputation;  Surgeon: Jerona LULLA Sage, MD;  Location: [facility] OR;  Service: Orthopedics;  Laterality: Right;  Right Foot Third Toe Amputation    AMPUTATION Right 07/27/2012   Procedure: Right 4th Toe Amputation at Metatarsophalangeal;  Surgeon: Jerona LULLA Sage, MD;  Location: [facility] OR;  Service: Orthopedics;  Laterality: Right;  Right 4th Toe Amputation at Metatarsophalangeal   AMPUTATION Left 07/15/2016   Procedure: Left 2nd Ray Amputation;  Surgeon: Sage Jerona LULLA, MD;  Location: [facility] OR;  Service: Orthopedics;  Laterality: Left;   AMPUTATION  Left 11/18/2016   Procedure: Left 3rd and 4th Ray Amputation;  Surgeon: Sage Jerona LULLA, MD;  Location: [facility] OR;  Service: Orthopedics;  Laterality: Left;   AMPUTATION Right 03/29/2017   Procedure: RIGHT FOOT 3RD AND 4TH RAY AMPUTATION;  Surgeon: Sage Jerona LULLA, MD;  Location: [facility] OR;  Service: Orthopedics;  Laterality: Right;   AMPUTATION Left 08/12/2020   Procedure: LEFT TRANSMETATARSAL AMPUTATION;  Surgeon: Sage Jerona LULLA, MD;  Location: [facility] OR;  Service: Orthopedics;  Laterality: Left;   AMPUTATION Right 05/31/2023   Procedure: AMPUTATION, FOOT, RAY;  Surgeon: Sage Jerona LULLA, MD;  Location: [facility] OR;  Service: Orthopedics;  Laterality: Right;  RIGHT 3RD METATARSAL RESECTION   APPLICATION OF WOUND VAC Left 08/12/2020   Procedure: APPLICATION OF WOUND VAC;  Surgeon: Sage Jerona LULLA, MD;  Location: [facility] OR;  Service: Orthopedics;  Laterality: Left;   COLONOSCOPY     LAPAROSCOPIC APPENDECTOMY  01/05/2011   Procedure: APPENDECTOMY LAPAROSCOPIC;  Surgeon: Donnice KATHEE Lunger, MD;  Location: [facility] ORS;  Service: General;  Laterality: N/A;   OOPHORECTOMY  2001   ROTATOR CUFF REPAIR Right    x 2   TUBAL LIGATION     VAGINAL HYSTERECTOMY  2001    Current Medications[1]  Allergies[2] ROS neg/noncontributory except as noted HPI/below      Objective:     BP 118/66 (BP Location: Left Arm, Patient Position: Sitting, Cuff Size: Normal)   Pulse 78   Temp 98.1 F (36.7 C) (Temporal)   Ht 5' 5 (1.651 m)   Wt 178 lb 2 oz (80.8 kg)   SpO2 95%   BMI 29.64 kg/m  Wt Readings from Last 3 Encounters:  12/26/23 178 lb 2 oz (80.8 kg)  11/30/23 190 lb 6.4 oz (86.4 kg)  11/16/23 183 lb 6.4 oz (83.2 kg)    Physical Exam GENERAL: Well developed, well nourished, no acute distress. HEAD EYES EARS NOSE THROAT: Normocephalic, atraumatic, conjunctiva not injected, sclera nonicteric. TM WNL B, OP moist, no exudates  CARDIAC: Regular rate and rhythm, S1 S2 present, Lungs:  coarse B and end exp wheezing B EXTREMITIES: No  edema. MUSCULOSKELETAL: No gross abnormalities.cane NEUROLOGICAL: Alert and oriented x3, cranial nerves II through XII intact. PSYCHIATRIC: Normal mood, good eye contact.      Reviewd labs Assessment & Plan:  Acute bacterial bronchitis  Other orders -     Azithromycin ; Take 2 tablets on day 1, then 1 tablet daily on days 2 through 5  Dispense: 6 tablet; Refill: 0 -     predniSONE ; Take 2 tablets (40 mg total) by mouth daily with breakfast for 5 days.  Dispense: 10 tablet; Refill: 0 -     Albuterol  Sulfate HFA; Inhale 2 puffs into the lungs  every 6 (six) hours as needed for wheezing or shortness of breath.  Dispense: 8 g; Refill: 0    Assessment and Plan Assessment & Plan Acute bacterial bronchitis   Cough has persisted for two weeks, initially with cold symptoms like rhinorrhea and congestion, but no fever. Examination reveals mild dyspnea and wheezing. Differential includes bronchitis secondary to a cold  Symptoms warrant treatment escalation. Prescribe prednisone  to reduce inflammation, azithromycin  (Z-Pak) for bacterial coverage, and an albuterol  inhaler for wheezing and dyspnea. Monitor symptoms and report any worsening. Discontinue prednisone  if symptoms improve in a few days.  Type 2 diabetes mellitus   Diabetes is well-controlled with Mounjaro , insulin , and metformin . Discussed prednisone 's potential impact on blood glucose and possible need for insulin  adjustment. Continue current diabetes medications and monitor blood glucose, especially if using prednisone .     Return if symptoms worsen or fail to improve.  Jenkins CHRISTELLA Carrel, MD     [1]  Current Outpatient Medications:    acetaminophen  (TYLENOL ) 500 MG tablet, Take 500 mg by mouth every 8 (eight) hours as needed for mild pain (pain score 1-3) or moderate pain (pain score 4-6)., Disp: , Rfl:    albuterol  (VENTOLIN  HFA) 108 (90 Base) MCG/ACT inhaler, Inhale 2 puffs into the lungs every 6 (six) hours as needed for wheezing or  shortness of breath., Disp: 8 g, Rfl: 0   azelastine  (ASTELIN ) 0.1 % nasal spray, Place 2 sprays into both nostrils 2 (two) times daily. (Patient taking differently: Place 2 sprays into both nostrils 2 (two) times daily as needed for allergies.), Disp: 30 mL, Rfl: 12   azithromycin  (ZITHROMAX ) 250 MG tablet, Take 2 tablets on day 1, then 1 tablet daily on days 2 through 5, Disp: 6 tablet, Rfl: 0   betamethasone  dipropionate (DIPROLENE ) 0.05 % cream, Apply 1 application topically 2 (two) times daily as needed (IRRITATION)., Disp: 30 g, Rfl: 0   Blood Glucose Monitoring Suppl (ONE TOUCH ULTRA 2) w/Device KIT, Use to check blood sugar 3-4 times a day DxCode:E11.9, Disp: 1 kit, Rfl: 0   Calcium  Carb-Cholecalciferol  (CALCIUM  + VITAMIN D3) 600-10 MG-MCG TABS, Take 2 tablets by mouth daily., Disp: 90 tablet, Rfl: 3   Cholecalciferol  (VITAMIN D3) 250 MCG (10000 UT) capsule, Take 10,000 Units by mouth daily., Disp: , Rfl:    clotrimazole  (CLOTRIMAZOLE  AF) 1 % cream, Apply 1 Application topically 2 (two) times daily. (Patient taking differently: Apply 1 Application topically 2 (two) times daily as needed (Rash).), Disp: 35.4 g, Rfl: 2   ferrous sulfate 325 (65 FE) MG tablet, Take 325 mg by mouth daily with breakfast., Disp: , Rfl:    fexofenadine  (ALLEGRA  ALLERGY) 180 MG tablet, Take 1 tablet (180 mg total) by mouth daily., Disp: 100 tablet, Rfl: 3   Fluocinolone  Acetonide 0.01 % OIL, Place 1-2 drops into both ears daily. As needed (Patient taking differently: Place 1-2 drops into both ears 2 (two) times a week.), Disp: 20 mL, Rfl: 0   gabapentin  (NEURONTIN ) 300 MG capsule, Take 1 capsule (300 mg total) by mouth 2 (two) times daily. May take an extra 300 mg at night as needed., Disp: 270 capsule, Rfl: 3   glucose blood (ONETOUCH ULTRA) test strip, Use to check blood sugar 3-4 times a day DxCode:E11.9, Disp: 400 each, Rfl: 5   Insulin  Pen Needle 32G X 4 MM MISC, Use 1x a day, Disp: 100 each, Rfl: 3   insulin   regular (NOVOLIN R RELION) 100 units/mL injection, Inject 0.2-0.3 mLs (20-30 Units  total) into the skin 3 (three) times daily before meals. Per sliding scale (Patient taking differently: Inject 10-15 Units into the skin 2 (two) times daily before a meal. Per sliding scale), Disp: , Rfl:    Insulin  Syringe-Needle U-100 (RELION INSULIN  SYRINGE 1ML/31G) 31G X 5/16 1 ML MISC, USE 2 TIMES A DAY, Disp: 100 each, Rfl: 2   irbesartan -hydrochlorothiazide  (AVALIDE) 150-12.5 MG tablet, TAKE 1 TABLET DAILY, Disp: 90 tablet, Rfl: 3   Lancets (ONETOUCH ULTRASOFT) lancets, Use to check blood sugar 3-4 times a day DxCode:E11.9, Disp: 100 each, Rfl: 12   metFORMIN  (GLUCOPHAGE ) 1000 MG tablet, TAKE 1 TABLET TWICE DAILY  WITH MEALS, Disp: 180 tablet, Rfl: 3   metoprolol  succinate (TOPROL -XL) 25 MG 24 hr tablet, TAKE 1 TABLET DAILY (PLEASE KEEP UPCOMING OFFICE VISIT FOR ANY MORE REFILLS), Disp: 90 tablet, Rfl: 2   omeprazole  (PRILOSEC) 40 MG capsule, TAKE 1 CAPSULE TWICE DAILY, Disp: 180 capsule, Rfl: 3   OVER THE COUNTER MEDICATION, Apply 1-3 application  topically at bedtime as needed (foot pain). TOPRICIN FOOT CREAM - NEUROPATHY FOOT CREAM **APPLIES TO BOTH FEET AT BEDTIME**, Disp: , Rfl:    predniSONE  (DELTASONE ) 20 MG tablet, Take 2 tablets (40 mg total) by mouth daily with breakfast for 5 days., Disp: 10 tablet, Rfl: 0   Probiotic Product (PROBIOTIC DAILY PO), Take 1 capsule by mouth daily., Disp: , Rfl:    rosuvastatin  (CRESTOR ) 20 MG tablet, Take 1 tablet (20 mg total) by mouth daily., Disp: 90 tablet, Rfl: 3   silver  sulfADIAZINE  (SILVADENE ) 1 % cream, Apply 1 Application topically daily. (Patient taking differently: Apply 1 Application topically daily as needed (Wound care).), Disp: 50 g, Rfl: 0   tirzepatide  (MOUNJARO ) 7.5 MG/0.5ML Pen, Inject 7.5 mg into the skin once a week., Disp: 6 mL, Rfl: 3   traZODone  (DESYREL ) 50 MG tablet, Take 0.5-1 tablets (25-50 mg total) by mouth at bedtime., Disp: 30 tablet, Rfl:  5   vitamin B-12 (CYANOCOBALAMIN ) 1000 MCG tablet, Take 1,000 mcg by mouth daily., Disp: , Rfl:    vitamin C (ASCORBIC ACID ) 500 MG tablet, Take 500 mg by mouth in the morning., Disp: , Rfl:    glucagon  (GLUCAGEN) 1 MG SOLR injection, Inject 1 mg into the muscle once as needed for up to 1 dose for low blood sugar., Disp: 1 each, Rfl: 11   sulfamethoxazole -trimethoprim  (BACTRIM  DS) 800-160 MG tablet, Take 1 tablet by mouth 2 (two) times daily., Disp: 20 tablet, Rfl: 0 [2]  Allergies Allergen Reactions   Doxycycline  Calcium      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

## 2023-12-28 ENCOUNTER — Ambulatory Visit: Admitting: Orthopedic Surgery

## 2023-12-28 DIAGNOSIS — Z89432 Acquired absence of left foot: Secondary | ICD-10-CM | POA: Diagnosis not present

## 2023-12-28 DIAGNOSIS — L97511 Non-pressure chronic ulcer of other part of right foot limited to breakdown of skin: Secondary | ICD-10-CM

## 2023-12-28 DIAGNOSIS — M14679 Charcot's joint, unspecified ankle and foot: Secondary | ICD-10-CM

## 2023-12-29 ENCOUNTER — Encounter: Payer: Self-pay | Admitting: Orthopedic Surgery

## 2023-12-29 NOTE — Progress Notes (Signed)
 "  Office Visit Note   [name]           Date of Birth: 26-Feb-1945           MRN: [id number] Visit Date: 12/28/2023              Requested by: Kennyth Worth HERO, MD [address] [place],  [place] [address] PCP: Kennyth Worth HERO, MD  Chief Complaint  Patient presents with   Right Foot - Wound Check      HPI: Discussed the use of AI scribe software for clinical note transcription with the patient, who gave verbal consent to proceed.  History of Present Illness Beth Holden is a 78 year old female with a Charcot foot deformity and Wagner grade one ulcer who presents for wound care management.  She has a history of foot problems, including a longstanding ulcer on her right foot. Recently, she was concerned about potential infection, although the ulcer was not infected at the time. She was prescribed antibiotics as a precautionary measure but has not needed to use them as the ulcer did not become infected.  She mentions that the bone in her foot is protruding more than before, which is a concern for her. She uses a felt relieving donut to help unload pressure from the ulcer site and continues to use wound care products. She cleans her wound dressings with Clorox to ensure they are sanitized.  Recently, she traveled to [facility] with her son, where she used a scooter to aid her mobility. Despite car trouble, she was able to enjoy her trip, including attending a [employer].     Assessment & Plan: Visit Diagnoses: No diagnosis found.  Plan: Assessment and Plan Assessment & Plan Charcot foot with Wagner grade 1 ulcer, right Ulcer debrided to healthy granulation tissue. Silver  nitrate applied. Ulcer size increased post-debridement. No infection present but risk remains due to Charcot deformity. - Monitor for infection signs, initiate antibiotics if needed. - Use felt relieving donuts to unload pressure. - Re-evaluate in four weeks.  Transmetatarsal  amputation with callus, left foot No open ulcers. Callus managed with cleaning and wound care products. - Continue cleaning and wound care. - Use felt relieving donuts to manage pressure.      Follow-Up Instructions: No follow-ups on file.   Ortho Exam  Patient is alert, oriented, no adenopathy, well-dressed, normal affect, normal respiratory effort. Physical Exam EXTREMITIES: Left foot callus over transmetatarsal amputation, paired, no open ulcers. Right foot Charcot rocker bottom deformity with a Wagner grade one ulcer.  After informed consent a 10 blade knife was used to debride the skin and soft tissue back to healthy viable granulation tissue.  Silver  nitrate was used for hemostasis.  Debridement of right foot ulcer to healthy granulation tissue. Right foot ulcer measured 1 cm in diameter before debridement and 2 cm after debridement.      Imaging: No results found. No images are attached to the encounter.  Labs: Lab Results  Component Value Date   HGBA1C 6.4 (A) 11/30/2023   HGBA1C 6.2 (A) 08/09/2023   HGBA1C 6.4 (A) 04/03/2023   ESRSEDRATE 51 (H) 12/24/2019   ESRSEDRATE 36 (H) 02/24/2017   CRP 13.2 (H) 12/24/2019   CRP 1.2 (H) 02/24/2017   LABURIC 5.5 09/01/2015   REPTSTATUS 06/03/2020 FINAL 05/29/2020   CULT  05/29/2020    NO GROWTH 5 DAYS Performed at [facility], [address] [place], [place] [address]  LABORGA GROUP B STREP (S.AGALACTIAE) ISOLATED 12/26/2014     Lab Results  Component Value Date   ALBUMIN 4.1 11/05/2021   ALBUMIN 4.1 11/05/2021   ALBUMIN 3.8 09/08/2020   PREALBUMIN 19.4 03/05/2012    No results found for: MG No results found for: VD25OH  Lab Results  Component Value Date   PREALBUMIN 19.4 03/05/2012      Latest Ref Rng & Units 06/20/2023    8:05 AM 05/31/2023    8:09 AM 12/16/2022    7:59 AM  CBC EXTENDED  WBC 4.0 - 10.5 K/uL 9.4  10.3  10.6   RBC 3.87 - 5.11 MIL/uL 4.06  4.09  4.18   Hemoglobin 12.0 - 15.0  g/dL 87.8  87.8  87.9   HCT 36.0 - 46.0 % 35.7  36.0  37.0   Platelets 150 - 400 K/uL 401  225  417   NEUT# 1.7 - 7.7 K/uL 6.2   6.8   Lymph# 0.7 - 4.0 K/uL 2.2   2.8      There is no height or weight on file to calculate BMI.  Orders:  No orders of the defined types were placed in this encounter.  No orders of the defined types were placed in this encounter.    Procedures: No procedures performed  Clinical Data: No additional findings.  ROS:  All other systems negative, except as noted in the HPI. Review of Systems  Objective: Vital Signs: There were no vitals taken for this visit.  Specialty Comments:  No specialty comments available.  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) 10/23/2019   Nuclear sclerotic cataract of both eyes 10/23/2019   Posterior vitreous detachment of right eye 10/23/2019   Retinal hemorrhage of left eye 10/23/2019   Impingement syndrome of right shoulder 07/06/2017   Iron deficiency anemia 05/11/2017   Anemia 02/24/2017   Baker's cyst 07/10/2016   Onychomycosis 12/07/2015   Upper airway cough syndrome 03/05/2014   Hypertension associated with diabetes (HCC) 12/05/2006   Type 2 diabetes mellitus with hyperglycemia (HCC) 10/12/2006   Dyslipidemia associated with type 2 diabetes mellitus (HCC) 10/12/2006   Allergic rhinitis 10/12/2006   GERD - Followed by Dr. Aneita 10/12/2006   IRRITABLE BOWEL SYNDROME - followed by Dr. Aneita 10/12/2006   Diabetic neuropathy (HCC) 10/11/2006   ALOPECIA NEC 10/11/2006   Past Medical History:  Diagnosis Date   Alopecia    Anemia, mild     Arthritis    Chronic cough    sees pulmonologist   Chronic pain    chest wall and abd - s/p extensive eval   Diabetes mellitus with neuropathy (HCC)    sees endocrine   Diverticulosis    Dyspnea    Fatty liver    GERD (gastroesophageal reflux disease)    takes Nexium  bid, hx erosive esophagitis   Headache(784.0)    occasionally;r/t sinus    History of colon polyps    HTN (hypertension)    Hx of amputation of lesser toe    sees podiatrist   Hyperlipemia    IBS (irritable bowel syndrome)    Insomnia  takes Elavil  nightly   Joint pain    Neuropathy    Neuropathy    Osteomyelitis (HCC)    Pneumonia    89/2/22- patient denies   PONV (postoperative nausea and vomiting)    medication did not help with surgery on 03/28/20   Seasonal allergies    takes Zyrtec  daily   Sinus tachycardia     Family History  Problem Relation Age of Onset   Lung cancer Mother 61       smoked heavily   Emphysema Mother    Hypertension Father    Hyperlipidemia Father    Diabetes Father    Coronary artery disease Father    Dementia Father    COPD Father        smoked   Stomach cancer Paternal Aunt    Brain cancer Paternal Uncle    Irritable bowel syndrome Other        Several family members on fathers side    Diabetes Other    Stomach cancer Maternal Aunt    Lung cancer Paternal Uncle    Heart disease Paternal Uncle    Colon cancer Neg Hx     Past Surgical History:  Procedure Laterality Date   AMPUTATION  12/28/2011   Procedure: AMPUTATION DIGIT;  Surgeon: Jerona LULLA Sage, MD;  Location: [facility] OR;  Service: Orthopedics;  Laterality: Right;  Right Foot 2nd Toe Amputation at MTP (metatarsophalangeal joint)   AMPUTATION Right 04/25/2012   Procedure: Right Foot 3rd Toe Amputation;  Surgeon: Jerona LULLA Sage, MD;  Location: [facility] OR;  Service: Orthopedics;  Laterality: Right;  Right Foot Third Toe Amputation    AMPUTATION Right 07/27/2012   Procedure: Right 4th Toe Amputation at Metatarsophalangeal;   Surgeon: Jerona LULLA Sage, MD;  Location: [facility] OR;  Service: Orthopedics;  Laterality: Right;  Right 4th Toe Amputation at Metatarsophalangeal   AMPUTATION Left 07/15/2016   Procedure: Left 2nd Ray Amputation;  Surgeon: Sage Jerona LULLA, MD;  Location: [facility] OR;  Service: Orthopedics;  Laterality: Left;   AMPUTATION Left 11/18/2016   Procedure: Left 3rd and 4th Ray Amputation;  Surgeon: Sage Jerona LULLA, MD;  Location: [facility] OR;  Service: Orthopedics;  Laterality: Left;   AMPUTATION Right 03/29/2017   Procedure: RIGHT FOOT 3RD AND 4TH RAY AMPUTATION;  Surgeon: Sage Jerona LULLA, MD;  Location: [facility] OR;  Service: Orthopedics;  Laterality: Right;   AMPUTATION Left 08/12/2020   Procedure: LEFT TRANSMETATARSAL AMPUTATION;  Surgeon: Sage Jerona LULLA, MD;  Location: [facility] OR;  Service: Orthopedics;  Laterality: Left;   AMPUTATION Right 05/31/2023   Procedure: AMPUTATION, FOOT, RAY;  Surgeon: Sage Jerona LULLA, MD;  Location: [facility] OR;  Service: Orthopedics;  Laterality: Right;  RIGHT 3RD METATARSAL RESECTION   APPLICATION OF WOUND VAC Left 08/12/2020   Procedure: APPLICATION OF WOUND VAC;  Surgeon: Sage Jerona LULLA, MD;  Location: [facility] OR;  Service: Orthopedics;  Laterality: Left;   COLONOSCOPY     LAPAROSCOPIC APPENDECTOMY  01/05/2011   Procedure: APPENDECTOMY LAPAROSCOPIC;  Surgeon: Donnice KATHEE Lunger, MD;  Location: [facility] ORS;  Service: General;  Laterality: N/A;   OOPHORECTOMY  2001   ROTATOR CUFF REPAIR Right    x 2   TUBAL LIGATION     VAGINAL HYSTERECTOMY  2001   Social History   Occupational History   Occupation: Retired   Tobacco Use   Smoking status: Never   Smokeless tobacco: Never  Vaping Use   Vaping status: Never Used  Substance and Sexual Activity  Alcohol use: Never   Drug use: Never   Sexual activity: Yes    Birth control/protection: Surgical         "

## 2024-01-18 ENCOUNTER — Encounter: Payer: Self-pay | Admitting: Nurse Practitioner

## 2024-01-22 ENCOUNTER — Ambulatory Visit: Admitting: Internal Medicine

## 2024-01-25 ENCOUNTER — Encounter: Payer: Self-pay | Admitting: Nurse Practitioner

## 2024-01-25 ENCOUNTER — Ambulatory Visit: Admitting: Orthopedic Surgery

## 2024-01-25 DIAGNOSIS — L97511 Non-pressure chronic ulcer of other part of right foot limited to breakdown of skin: Secondary | ICD-10-CM | POA: Diagnosis not present

## 2024-01-29 ENCOUNTER — Encounter: Payer: Self-pay | Admitting: Orthopedic Surgery

## 2024-01-29 NOTE — Progress Notes (Signed)
 "  Office Visit Note   [name]           Date of Birth: 19-Jan-1945           MRN: [id number] Visit Date: 01/25/2024              Requested by: Kennyth Worth HERO, MD [address] [place],  [place] [address] PCP: Kennyth Worth HERO, MD  Chief Complaint  Patient presents with   Right Foot - Follow-up   Left Foot - Follow-up      HPI: Discussed the use of AI scribe software for clinical note transcription with the patient, who gave verbal consent to proceed.  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  She presents for routine evaluation of chronic wounds involving both feet. On the right foot, she has a recurrent ulcer in the setting of Charcot deformity. Over the past week, she has increased activity on the right foot. This morning, she noted malodor at the ulcer site during wound care, which prompted concern. She also observed a small subcutaneous blood clot at the ulcer, recalling a prior episode with a larger clot that caused more significant issues. The current clot is smaller and associated with minor bleeding. She denies new trauma or irritation from sheets or socks. She continues to use a felt relieving donut for offloading as previously instructed and finds it beneficial.  She remains compliant with wound care and bandaging. She does not report systemic symptoms such as fever or chills.     Assessment & Plan: Visit Diagnoses:  1. Non-pressure chronic ulcer of other part of right foot limited to breakdown of skin (HCC)     Plan: Assessment and Plan Assessment & Plan Recurrent Wagner grade 1 right foot ulcer with Charcot arthropathy Chronic recurrent Wagner grade 1 ulcer on the right foot with Charcot arthropathy. Ulcer showed viable granulation tissue post-debridement, no infection present. - Debrided skin  and soft tissue of the right foot ulcer with a ten blade knife after informed consent. - Applied silver  nitrate for hemostasis. - Reapplied felt relieving donut for offloading. - Advised continued use of the donut for pressure relief. - Scheduled follow-up in four weeks.  Callus over left transmetatarsal amputation site Callus over left transmetatarsal amputation site without deep wounds or infection. - Pared the callus with a ten blade knife. - No further intervention required at this time.      Follow-Up Instructions: Return in about 4 weeks (around 02/22/2024).   Ortho Exam  Patient is alert, oriented, no adenopathy, well-dressed, normal affect, normal respiratory effort. Physical Exam EXTREMITIES: Callus over left transmetatarsal amputation. Charcot rocker bottom deformity on right foot. Recurrent Wagner grade one ulcer on right foot. No deep wounds, no infection on left foot. Wound 1 cm diameter prior, 2 cm after debridement on right foot.    After informed consent a 10 blade knife was used to debride the skin and soft tissue back to bleeding viable granulation tissue.  The wound was 1 cm in diameter prior debridement 2 cm in diameter after debridement with healthy granulation tissue.  This was touched with silver  nitrate.  There is no exposed bone or tendon the ulcer is directly beneath the Charcot rocker-bottom deformity.  Imaging: No results found. No images are attached to the encounter.  Labs: Lab Results  Component Value Date  HGBA1C 6.4 (A) 11/30/2023   HGBA1C 6.2 (A) 08/09/2023   HGBA1C 6.4 (A) 04/03/2023   ESRSEDRATE 51 (H) 12/24/2019   ESRSEDRATE 36 (H) 02/24/2017   CRP 13.2 (H) 12/24/2019   CRP 1.2 (H) 02/24/2017   LABURIC 5.5 09/01/2015   REPTSTATUS 06/03/2020 FINAL 05/29/2020   CULT  05/29/2020    NO GROWTH 5 DAYS Performed at [facility], [address] [place], [place] [address]    LABORGA GROUP B STREP (S.AGALACTIAE) ISOLATED 12/26/2014      Lab Results  Component Value Date   ALBUMIN 4.1 11/05/2021   ALBUMIN 4.1 11/05/2021   ALBUMIN 3.8 09/08/2020   PREALBUMIN 19.4 03/05/2012    No results found for: MG No results found for: [facility]  Lab Results  Component Value Date   PREALBUMIN 19.4 03/05/2012      Latest Ref Rng & Units 06/20/2023    8:05 AM 05/31/2023    8:09 AM 12/16/2022    7:59 AM  CBC EXTENDED  WBC 4.0 - 10.5 K/uL 9.4  10.3  10.6   RBC 3.87 - 5.11 MIL/uL 4.06  4.09  4.18   Hemoglobin 12.0 - 15.0 g/dL 87.8  87.8  87.9   HCT 36.0 - 46.0 % 35.7  36.0  37.0   Platelets 150 - 400 K/uL 401  225  417   NEUT# 1.7 - 7.7 K/uL 6.2   6.8   Lymph# 0.7 - 4.0 K/uL 2.2   2.8      There is no height or weight on file to calculate BMI.  Orders:  No orders of the defined types were placed in this encounter.  No orders of the defined types were placed in this encounter.    Procedures: No procedures performed  Clinical Data: No additional findings.  ROS:  All other systems negative, except as noted in the HPI. Review of Systems  Objective: Vital Signs: There were no vitals taken for this visit.  Specialty Comments:  No specialty comments available.  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) 10/23/2019   Nuclear sclerotic cataract of both eyes 10/23/2019   Posterior vitreous detachment of right eye 10/23/2019   Retinal hemorrhage of left eye 10/23/2019   Impingement syndrome of right shoulder 07/06/2017   Iron deficiency anemia 05/11/2017   Anemia 02/24/2017   Baker's cyst 07/10/2016   Onychomycosis 12/07/2015   Upper  airway cough syndrome 03/05/2014   Hypertension associated with diabetes (HCC) 12/05/2006   Type 2 diabetes mellitus with hyperglycemia (HCC) 10/12/2006   Dyslipidemia associated with type 2 diabetes mellitus (HCC) 10/12/2006   Allergic rhinitis 10/12/2006   GERD - Followed by Dr. Aneita 10/12/2006   IRRITABLE BOWEL SYNDROME - followed by Dr. Aneita 10/12/2006   Diabetic neuropathy (HCC) 10/11/2006   ALOPECIA NEC 10/11/2006   Past Medical History:  Diagnosis Date   Alopecia    Anemia, mild    Arthritis    Chronic cough    sees pulmonologist   Chronic pain    chest wall and abd - s/p extensive eval   Diabetes mellitus with neuropathy (HCC)  sees endocrine   Diverticulosis    Dyspnea    Fatty liver    GERD (gastroesophageal reflux disease)    takes Nexium  bid, hx erosive esophagitis   Headache(784.0)    occasionally;r/t sinus    History of colon polyps    HTN (hypertension)    Hx of amputation of lesser toe    sees podiatrist   Hyperlipemia    IBS (irritable bowel syndrome)    Insomnia    takes Elavil  nightly   Joint pain    Neuropathy    Neuropathy    Osteomyelitis (HCC)    Pneumonia    89/2/22- patient denies   PONV (postoperative nausea and vomiting)    medication did not help with surgery on 03/28/20   Seasonal allergies    takes Zyrtec  daily   Sinus tachycardia     Family History  Problem Relation Age of Onset   Lung cancer Mother 29       smoked heavily   Emphysema Mother    Hypertension Father    Hyperlipidemia Father    Diabetes Father    Coronary artery disease Father    Dementia Father    COPD Father        smoked   Stomach cancer Paternal Aunt    Brain cancer Paternal Uncle    Irritable bowel syndrome Other        Several family members on fathers side    Diabetes Other    Stomach cancer Maternal Aunt    Lung cancer Paternal Uncle    Heart disease Paternal Uncle    Colon cancer Neg Hx     Past Surgical History:  Procedure Laterality Date    AMPUTATION  12/28/2011   Procedure: AMPUTATION DIGIT;  Surgeon: Jerona LULLA Sage, MD;  Location: [facility] OR;  Service: Orthopedics;  Laterality: Right;  Right Foot 2nd Toe Amputation at MTP (metatarsophalangeal joint)   AMPUTATION Right 04/25/2012   Procedure: Right Foot 3rd Toe Amputation;  Surgeon: Jerona LULLA Sage, MD;  Location: [facility] OR;  Service: Orthopedics;  Laterality: Right;  Right Foot Third Toe Amputation    AMPUTATION Right 07/27/2012   Procedure: Right 4th Toe Amputation at Metatarsophalangeal;  Surgeon: Jerona LULLA Sage, MD;  Location: [facility] OR;  Service: Orthopedics;  Laterality: Right;  Right 4th Toe Amputation at Metatarsophalangeal   AMPUTATION Left 07/15/2016   Procedure: Left 2nd Ray Amputation;  Surgeon: Sage Jerona LULLA, MD;  Location: [facility] OR;  Service: Orthopedics;  Laterality: Left;   AMPUTATION Left 11/18/2016   Procedure: Left 3rd and 4th Ray Amputation;  Surgeon: Sage Jerona LULLA, MD;  Location: [facility] OR;  Service: Orthopedics;  Laterality: Left;   AMPUTATION Right 03/29/2017   Procedure: RIGHT FOOT 3RD AND 4TH RAY AMPUTATION;  Surgeon: Sage Jerona LULLA, MD;  Location: [facility] OR;  Service: Orthopedics;  Laterality: Right;   AMPUTATION Left 08/12/2020   Procedure: LEFT TRANSMETATARSAL AMPUTATION;  Surgeon: Sage Jerona LULLA, MD;  Location: [facility] OR;  Service: Orthopedics;  Laterality: Left;   AMPUTATION Right 05/31/2023   Procedure: AMPUTATION, FOOT, RAY;  Surgeon: Sage Jerona LULLA, MD;  Location: [facility] OR;  Service: Orthopedics;  Laterality: Right;  RIGHT 3RD METATARSAL RESECTION   APPLICATION OF WOUND VAC Left 08/12/2020   Procedure: APPLICATION OF WOUND VAC;  Surgeon: Sage Jerona LULLA, MD;  Location: [facility] OR;  Service: Orthopedics;  Laterality: Left;   COLONOSCOPY     LAPAROSCOPIC APPENDECTOMY  01/05/2011   Procedure: APPENDECTOMY LAPAROSCOPIC;  Surgeon: Donnice KATHEE Lunger,  MD;  Location: [facility] ORS;  Service: General;  Laterality: N/A;   OOPHORECTOMY  2001   ROTATOR CUFF REPAIR Right    x 2   TUBAL LIGATION     VAGINAL  HYSTERECTOMY  2001   Social History   Occupational History   Occupation: Retired   Tobacco Use   Smoking status: Never   Smokeless tobacco: Never  Vaping Use   Vaping status: Never Used  Substance and Sexual Activity   Alcohol use: Never   Drug use: Never   Sexual activity: Yes    Birth control/protection: Surgical         "

## 2024-02-02 ENCOUNTER — Telehealth: Payer: Self-pay

## 2024-02-02 NOTE — Telephone Encounter (Signed)
 Please review message and call or fax over papers to ensure patient can keep insurance. Please and thank you.  Copied from CRM [id number]. Topic: General - Other >> Feb 02, 2024 11:20 AM Viola FALCON wrote: Reason for CRM: Glena from [employer] called to follow up on chronic condition form that was faxed several times since December. Confirmed correct fax number. They do need the confirmation of diagnosis by the end of February or they will be dis enrolling patient from insurance plan. They can accept it verbally, please call [phone].

## 2024-02-03 ENCOUNTER — Encounter: Payer: Self-pay | Admitting: Family Medicine

## 2024-02-05 NOTE — Telephone Encounter (Signed)
 Please advise.

## 2024-02-06 NOTE — Telephone Encounter (Signed)
 Called and spoke to Lauraine Hun and verbally gave her past A1C number over 6.5 per her request. I also faxed over last dictation notes from Dr. Kennyth to the fax number [phone] provided by Lauraine Hun. Lauraine Hun expressed that she was sending fax and verbal A1C notes to the medical team to conclude if they would cover her Mounjaro . Lauraine Hun also confirmed that she did receive the fax while we were on the phone. Lauraine Hun expressed that patient would get a letter once a decision is made and they'll fax the office a letter as well. No other questions or concerns were addressed during the call. Patient notified of contact to [employer] via MyChart.

## 2024-02-06 NOTE — Telephone Encounter (Addendum)
 Form faxed to [phone] Form placed to be scan in patient chart

## 2024-02-06 NOTE — Telephone Encounter (Signed)
 Please send in an prescription for her Mounjaro .

## 2024-02-06 NOTE — Telephone Encounter (Signed)
 Copied from CRM [id number]. Topic: Clinical - Medical Advice >> Feb 05, 2024 12:31 PM Laymon HERO wrote: Reason for CRM: lauraine hun [employer] direct # [phone]- Needing clinical documentation on diagnosis as to why she is needing the Mounjaro . please fax to # [phone] >> Feb 05, 2024 12:42 PM Drema MATSU wrote: Pt called to inform clinic that form needs to be filed out within 72 time frame. **Pt is taking medication for A1C and wants an ongoing 3 month supply. She wants to let clinic know. She has [employer].

## 2024-02-13 ENCOUNTER — Other Ambulatory Visit: Payer: Self-pay

## 2024-02-13 ENCOUNTER — Other Ambulatory Visit (HOSPITAL_BASED_OUTPATIENT_CLINIC_OR_DEPARTMENT_OTHER): Payer: Self-pay

## 2024-02-13 ENCOUNTER — Encounter: Payer: Self-pay | Admitting: Nurse Practitioner

## 2024-02-13 ENCOUNTER — Ambulatory Visit: Admitting: Orthopedic Surgery

## 2024-02-13 DIAGNOSIS — M79671 Pain in right foot: Secondary | ICD-10-CM

## 2024-02-13 DIAGNOSIS — L02611 Cutaneous abscess of right foot: Secondary | ICD-10-CM

## 2024-02-13 DIAGNOSIS — M14679 Charcot's joint, unspecified ankle and foot: Secondary | ICD-10-CM | POA: Diagnosis not present

## 2024-02-13 DIAGNOSIS — L97511 Non-pressure chronic ulcer of other part of right foot limited to breakdown of skin: Secondary | ICD-10-CM

## 2024-02-13 MED ORDER — SULFAMETHOXAZOLE-TRIMETHOPRIM 800-160 MG PO TABS
1.0000 | ORAL_TABLET | Freq: Two times a day (BID) | ORAL | 0 refills | Status: AC
  Filled 2024-02-13: qty 20, 10d supply, fill #0

## 2024-02-14 ENCOUNTER — Encounter: Payer: Self-pay | Admitting: Orthopedic Surgery

## 2024-02-14 NOTE — Progress Notes (Signed)
 "  Office Visit Note   [name]           Date of Birth: May 31, 1945           MRN: [id number] Visit Date: 02/13/2024              Requested by: Kennyth Worth HERO, MD [address] [place],  [place] [address] PCP: Kennyth Worth HERO, MD  Chief Complaint  Patient presents with   Right Foot - Pain      HPI: Discussed the use of AI scribe software for clinical note transcription with the patient, who gave verbal consent to proceed.  History of Present Illness Beth Holden is a 79 year old female with prior right foot amputations and Charcot arthropathy who presents with new right foot pain and ulceration.  She has a history of amputation of the second, third, and fourth metatarsals of the right foot. Over the past two weeks, she has experienced significant limitation in ambulation and has avoided weight-bearing on the right foot, with minimal activity over the past weekend due to increased discomfort.  She reports new onset dull pain in the right foot, which she distinguishes from her baseline neuropathy. The pain is localized, associated with increased redness, and accompanied by a sensation of a possible blood clot in a specific area. She notes this pain is different from her usual neuropathic symptoms.  She has been performing local wound care, including twice daily cleansing with Dial soap, frequent dressing changes, and use of felt relieving donuts for pressure offloading. She began applying an herbal topical treatment over the weekend.  She recalls a similar episode in December, when she developed a blood clot in the foot and was prescribed a four-day course of antibiotics, though she cannot recall the specific agent. She reports no adverse reactions to previous antibiotics except for doxycycline , which caused a rash and sores. She has previously tolerated Bactrim  DS, though she developed a yeast infection with its use. She is currently not taking antibiotics and  continues local wound care as described.     Assessment & Plan: Visit Diagnoses:  1. Pain in right foot   2. Non-pressure chronic ulcer of other part of right foot limited to breakdown of skin (HCC)   3. Charcot arthropathy of midfoot   4. Cutaneous abscess of right foot     Plan: Assessment and Plan Assessment & Plan Cellulitis and chronic ulcer of right foot with Charcot arthropathy Chronic ulcer and cellulitis over Charcot collapse of right medial column. Increased pain, erythema, and drainage suggest infection. Wound debrided to healthy granulation tissue. - Debrided right foot ulcer to healthy granulation tissue. - Prescribed Bactrim  DS for 10 days, sent to [facility]. - Instructed to continue dial soap cleansing of the wound. - Advised continued use of felt relieving donuts for pressure offloading. - Instructed to keep the wound dry and use a face bandage or Coban as needed. - Arranged follow-up in two weeks.      Follow-Up Instructions: Return in about 2 weeks (around 02/27/2024).   Ortho Exam  Patient is alert, oriented, no adenopathy, well-dressed, normal affect, normal respiratory effort. Physical Exam EXTREMITIES: Cellulitis over Charcot collapse medial column, medial cuneiform. Ulceration and drainage present. Dark drainage and bleeding, cellulitis around area.   After informed consent a 10 blade knife was used to debride the skin and soft tissue from the ulcer over the Charcot collapse.  This is debrided back to healthy viable tissue.  The ulcer did not probe to bone.  Ulcerative tissue measures 2 x 3 cm after debridement.  There is surrounding cellulitis.   Imaging: XR Foot 2 Views Right Result Date: 02/14/2024 Three-view radiographs of the right foot shows a lytic destructive bony lesion of the medial cuneiform and medial border right foot consistent with the area of the ulcer  No images are attached to the encounter.  Labs: Lab Results  Component  Value Date   HGBA1C 6.4 (A) 11/30/2023   HGBA1C 6.2 (A) 08/09/2023   HGBA1C 6.4 (A) 04/03/2023   ESRSEDRATE 51 (H) 12/24/2019   ESRSEDRATE 36 (H) 02/24/2017   CRP 13.2 (H) 12/24/2019   CRP 1.2 (H) 02/24/2017   LABURIC 5.5 09/01/2015   REPTSTATUS 06/03/2020 FINAL 05/29/2020   CULT  05/29/2020    NO GROWTH 5 DAYS Performed at [facility], [address] [place], [place] [address]    LABORGA GROUP B STREP (S.AGALACTIAE) ISOLATED 12/26/2014     Lab Results  Component Value Date   ALBUMIN 4.1 11/05/2021   ALBUMIN 4.1 11/05/2021   ALBUMIN 3.8 09/08/2020   PREALBUMIN 19.4 03/05/2012    No results found for: MG No results found for: [facility]  Lab Results  Component Value Date   PREALBUMIN 19.4 03/05/2012      Latest Ref Rng & Units 06/20/2023    8:05 AM 05/31/2023    8:09 AM 12/16/2022    7:59 AM  CBC EXTENDED  WBC 4.0 - 10.5 K/uL 9.4  10.3  10.6   RBC 3.87 - 5.11 MIL/uL 4.06  4.09  4.18   Hemoglobin 12.0 - 15.0 g/dL 87.8  87.8  87.9   HCT 36.0 - 46.0 % 35.7  36.0  37.0   Platelets 150 - 400 K/uL 401  225  417   NEUT# 1.7 - 7.7 K/uL 6.2   6.8   Lymph# 0.7 - 4.0 K/uL 2.2   2.8      There is no height or weight on file to calculate BMI.  Orders:  Orders Placed This Encounter  Procedures   XR Foot 2 Views Right   Meds ordered this encounter  Medications   sulfamethoxazole -trimethoprim  (BACTRIM  DS) 800-160 MG tablet    Sig: Take 1 tablet by mouth 2 (two) times daily.    Dispense:  20 tablet    Refill:  0     Procedures: No procedures performed  Clinical Data: No additional findings.  ROS:  All other systems negative, except as noted in the HPI. Review of Systems  Objective: Vital Signs: There were no vitals taken for this visit.  Specialty Comments:  No specialty comments available.  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) 10/23/2019   Nuclear sclerotic cataract of both eyes 10/23/2019   Posterior vitreous detachment of right eye 10/23/2019   Retinal hemorrhage of left eye 10/23/2019   Impingement syndrome of right shoulder 07/06/2017   Iron deficiency anemia 05/11/2017   Anemia 02/24/2017   Baker's cyst 07/10/2016   Onychomycosis 12/07/2015   Upper airway cough syndrome 03/05/2014   Hypertension associated with diabetes (HCC) 12/05/2006  Type 2 diabetes mellitus with hyperglycemia (HCC) 10/12/2006   Dyslipidemia associated with type 2 diabetes mellitus (HCC) 10/12/2006   Allergic rhinitis 10/12/2006   GERD - Followed by Dr. Aneita 10/12/2006   IRRITABLE BOWEL SYNDROME - followed by Dr. Aneita 10/12/2006   Diabetic neuropathy (HCC) 10/11/2006   ALOPECIA NEC 10/11/2006   Past Medical History:  Diagnosis Date   Alopecia    Anemia, mild    Arthritis    Chronic cough    sees pulmonologist   Chronic pain    chest wall and abd - s/p extensive eval   Diabetes mellitus with neuropathy (HCC)    sees endocrine   Diverticulosis    Dyspnea    Fatty liver    GERD (gastroesophageal reflux disease)    takes Nexium  bid, hx erosive esophagitis   Headache(784.0)    occasionally;r/t sinus    History of colon polyps    HTN (hypertension)    Hx of amputation of lesser toe    sees podiatrist   Hyperlipemia    IBS (irritable bowel syndrome)    Insomnia    takes Elavil  nightly   Joint pain    Neuropathy    Neuropathy    Osteomyelitis (HCC)    Pneumonia    89/2/22- patient denies   PONV (postoperative nausea and vomiting)    medication did not help with surgery on 03/28/20   Seasonal allergies    takes Zyrtec  daily   Sinus tachycardia     Family  History  Problem Relation Age of Onset   Lung cancer Mother 35       smoked heavily   Emphysema Mother    Hypertension Father    Hyperlipidemia Father    Diabetes Father    Coronary artery disease Father    Dementia Father    COPD Father        smoked   Stomach cancer Paternal Aunt    Brain cancer Paternal Uncle    Irritable bowel syndrome Other        Several family members on fathers side    Diabetes Other    Stomach cancer Maternal Aunt    Lung cancer Paternal Uncle    Heart disease Paternal Uncle    Colon cancer Neg Hx     Past Surgical History:  Procedure Laterality Date   AMPUTATION  12/28/2011   Procedure: AMPUTATION DIGIT;  Surgeon: Jerona LULLA Sage, MD;  Location: [facility] OR;  Service: Orthopedics;  Laterality: Right;  Right Foot 2nd Toe Amputation at MTP (metatarsophalangeal joint)   AMPUTATION Right 04/25/2012   Procedure: Right Foot 3rd Toe Amputation;  Surgeon: Jerona LULLA Sage, MD;  Location: [facility] OR;  Service: Orthopedics;  Laterality: Right;  Right Foot Third Toe Amputation    AMPUTATION Right 07/27/2012   Procedure: Right 4th Toe Amputation at Metatarsophalangeal;  Surgeon: Jerona LULLA Sage, MD;  Location: [facility] OR;  Service: Orthopedics;  Laterality: Right;  Right 4th Toe Amputation at Metatarsophalangeal   AMPUTATION Left 07/15/2016   Procedure: Left 2nd Ray Amputation;  Surgeon: Sage Jerona LULLA, MD;  Location: [facility] OR;  Service: Orthopedics;  Laterality: Left;   AMPUTATION Left 11/18/2016   Procedure: Left 3rd and 4th Ray Amputation;  Surgeon: Sage Jerona LULLA, MD;  Location: [facility] OR;  Service: Orthopedics;  Laterality: Left;   AMPUTATION Right 03/29/2017   Procedure: RIGHT FOOT 3RD AND 4TH RAY AMPUTATION;  Surgeon: Sage Jerona LULLA, MD;  Location: [facility] OR;  Service: Orthopedics;  Laterality: Right;  AMPUTATION Left 08/12/2020   Procedure: LEFT TRANSMETATARSAL AMPUTATION;  Surgeon: Harden Jerona GAILS, MD;  Location: [facility] OR;  Service: Orthopedics;  Laterality: Left;   AMPUTATION Right 05/31/2023    Procedure: AMPUTATION, FOOT, RAY;  Surgeon: Harden Jerona GAILS, MD;  Location: [facility] OR;  Service: Orthopedics;  Laterality: Right;  RIGHT 3RD METATARSAL RESECTION   APPLICATION OF WOUND VAC Left 08/12/2020   Procedure: APPLICATION OF WOUND VAC;  Surgeon: Harden Jerona GAILS, MD;  Location: [facility] OR;  Service: Orthopedics;  Laterality: Left;   COLONOSCOPY     LAPAROSCOPIC APPENDECTOMY  01/05/2011   Procedure: APPENDECTOMY LAPAROSCOPIC;  Surgeon: Donnice KATHEE Lunger, MD;  Location: [facility] ORS;  Service: General;  Laterality: N/A;   OOPHORECTOMY  2001   ROTATOR CUFF REPAIR Right    x 2   TUBAL LIGATION     VAGINAL HYSTERECTOMY  2001   Social History   Occupational History   Occupation: Retired   Tobacco Use   Smoking status: Never   Smokeless tobacco: Never  Vaping Use   Vaping status: Never Used  Substance and Sexual Activity   Alcohol use: Never   Drug use: Never   Sexual activity: Yes    Birth control/protection: Surgical         "

## 2024-02-15 ENCOUNTER — Other Ambulatory Visit (HOSPITAL_COMMUNITY): Payer: Self-pay

## 2024-02-16 ENCOUNTER — Other Ambulatory Visit (HOSPITAL_BASED_OUTPATIENT_CLINIC_OR_DEPARTMENT_OTHER): Payer: Self-pay

## 2024-02-21 ENCOUNTER — Other Ambulatory Visit

## 2024-02-21 ENCOUNTER — Ambulatory Visit: Admitting: Oncology

## 2024-02-22 ENCOUNTER — Inpatient Hospital Stay

## 2024-02-22 ENCOUNTER — Inpatient Hospital Stay: Admitting: Oncology

## 2024-02-29 ENCOUNTER — Ambulatory Visit: Admitting: Orthopedic Surgery

## 2024-03-06 ENCOUNTER — Ambulatory Visit: Admitting: Family Medicine

## 2024-11-20 ENCOUNTER — Ambulatory Visit: Admitting: Family Medicine
